# Patient Record
Sex: Female | Born: 1940 | Race: White | Hispanic: No | Smoking: Former smoker
Health system: Southern US, Community
[De-identification: ages and names within clinical notes are randomized; demographics above are authoritative.]

## PROBLEM LIST (undated history)

## (undated) DIAGNOSIS — Z9289 Personal history of other medical treatment: Secondary | ICD-10-CM

## (undated) DIAGNOSIS — I10 Essential (primary) hypertension: Secondary | ICD-10-CM

## (undated) DIAGNOSIS — I4891 Unspecified atrial fibrillation: Secondary | ICD-10-CM

## (undated) DIAGNOSIS — Z720 Tobacco use: Secondary | ICD-10-CM

## (undated) DIAGNOSIS — IMO0002 Reserved for concepts with insufficient information to code with codable children: Secondary | ICD-10-CM

## (undated) DIAGNOSIS — E079 Disorder of thyroid, unspecified: Secondary | ICD-10-CM

## (undated) HISTORY — DX: Tobacco use: Z72.0

## (undated) HISTORY — DX: Unspecified atrial fibrillation: I48.91

## (undated) HISTORY — DX: Reserved for concepts with insufficient information to code with codable children: IMO0002

## (undated) HISTORY — PX: BACK SURGERY: SHX140

## (undated) HISTORY — DX: Personal history of other medical treatment: Z92.89

## (undated) HISTORY — PX: BREAST EXCISIONAL BIOPSY: SUR124

---

## 2000-10-21 ENCOUNTER — Ambulatory Visit (HOSPITAL_COMMUNITY): Admission: RE | Admit: 2000-10-21 | Discharge: 2000-10-21 | Payer: Self-pay | Admitting: Internal Medicine

## 2000-10-21 ENCOUNTER — Encounter: Payer: Self-pay | Admitting: Internal Medicine

## 2000-10-25 ENCOUNTER — Encounter: Payer: Self-pay | Admitting: Family Medicine

## 2000-10-25 ENCOUNTER — Ambulatory Visit (HOSPITAL_COMMUNITY): Admission: RE | Admit: 2000-10-25 | Discharge: 2000-10-25 | Payer: Self-pay | Admitting: Family Medicine

## 2000-10-26 ENCOUNTER — Other Ambulatory Visit: Admission: RE | Admit: 2000-10-26 | Discharge: 2000-10-26 | Payer: Self-pay | Admitting: Family Medicine

## 2000-10-31 ENCOUNTER — Ambulatory Visit (HOSPITAL_COMMUNITY): Admission: RE | Admit: 2000-10-31 | Discharge: 2000-10-31 | Payer: Self-pay | Admitting: General Surgery

## 2001-02-01 ENCOUNTER — Encounter: Payer: Self-pay | Admitting: Family Medicine

## 2001-02-01 ENCOUNTER — Ambulatory Visit (HOSPITAL_COMMUNITY): Admission: RE | Admit: 2001-02-01 | Discharge: 2001-02-01 | Payer: Self-pay | Admitting: Family Medicine

## 2001-02-20 ENCOUNTER — Encounter: Payer: Self-pay | Admitting: Otolaryngology

## 2001-02-20 ENCOUNTER — Ambulatory Visit (HOSPITAL_COMMUNITY): Admission: RE | Admit: 2001-02-20 | Discharge: 2001-02-20 | Payer: Self-pay | Admitting: Otolaryngology

## 2001-10-23 ENCOUNTER — Ambulatory Visit (HOSPITAL_COMMUNITY): Admission: RE | Admit: 2001-10-23 | Discharge: 2001-10-23 | Payer: Self-pay | Admitting: Family Medicine

## 2001-10-23 ENCOUNTER — Encounter: Payer: Self-pay | Admitting: Family Medicine

## 2001-11-07 ENCOUNTER — Encounter: Payer: Self-pay | Admitting: Family Medicine

## 2001-11-07 ENCOUNTER — Ambulatory Visit (HOSPITAL_COMMUNITY): Admission: RE | Admit: 2001-11-07 | Discharge: 2001-11-07 | Payer: Self-pay | Admitting: Family Medicine

## 2002-10-23 ENCOUNTER — Encounter: Payer: Self-pay | Admitting: Family Medicine

## 2002-10-23 ENCOUNTER — Ambulatory Visit (HOSPITAL_COMMUNITY): Admission: RE | Admit: 2002-10-23 | Discharge: 2002-10-23 | Payer: Self-pay | Admitting: Family Medicine

## 2002-10-26 ENCOUNTER — Encounter: Payer: Self-pay | Admitting: Family Medicine

## 2002-10-26 ENCOUNTER — Ambulatory Visit (HOSPITAL_COMMUNITY): Admission: RE | Admit: 2002-10-26 | Discharge: 2002-10-26 | Payer: Self-pay | Admitting: Family Medicine

## 2003-10-28 ENCOUNTER — Ambulatory Visit (HOSPITAL_COMMUNITY): Admission: RE | Admit: 2003-10-28 | Discharge: 2003-10-28 | Payer: Self-pay | Admitting: Family Medicine

## 2004-01-14 ENCOUNTER — Ambulatory Visit (HOSPITAL_COMMUNITY): Admission: RE | Admit: 2004-01-14 | Discharge: 2004-01-14 | Payer: Self-pay | Admitting: Family Medicine

## 2004-01-16 ENCOUNTER — Ambulatory Visit (HOSPITAL_COMMUNITY): Admission: RE | Admit: 2004-01-16 | Discharge: 2004-01-16 | Payer: Self-pay | Admitting: Family Medicine

## 2004-08-20 ENCOUNTER — Other Ambulatory Visit: Admission: RE | Admit: 2004-08-20 | Discharge: 2004-08-20 | Payer: Self-pay | Admitting: Dermatology

## 2004-11-17 ENCOUNTER — Ambulatory Visit (HOSPITAL_COMMUNITY): Admission: RE | Admit: 2004-11-17 | Discharge: 2004-11-17 | Payer: Self-pay | Admitting: Family Medicine

## 2004-12-02 ENCOUNTER — Ambulatory Visit (HOSPITAL_COMMUNITY): Admission: RE | Admit: 2004-12-02 | Discharge: 2004-12-02 | Payer: Self-pay | Admitting: Family Medicine

## 2005-01-11 ENCOUNTER — Encounter (HOSPITAL_COMMUNITY): Admission: RE | Admit: 2005-01-11 | Discharge: 2005-02-10 | Payer: Self-pay | Admitting: Orthopaedic Surgery

## 2005-07-13 ENCOUNTER — Ambulatory Visit (HOSPITAL_COMMUNITY): Admission: RE | Admit: 2005-07-13 | Discharge: 2005-07-13 | Payer: Self-pay | Admitting: Family Medicine

## 2005-09-02 ENCOUNTER — Ambulatory Visit: Payer: Self-pay | Admitting: Internal Medicine

## 2005-09-17 ENCOUNTER — Encounter (INDEPENDENT_AMBULATORY_CARE_PROVIDER_SITE_OTHER): Payer: Self-pay | Admitting: Specialist

## 2005-09-17 ENCOUNTER — Ambulatory Visit (HOSPITAL_COMMUNITY): Admission: RE | Admit: 2005-09-17 | Discharge: 2005-09-17 | Payer: Self-pay | Admitting: Internal Medicine

## 2005-09-17 ENCOUNTER — Ambulatory Visit: Payer: Self-pay | Admitting: Internal Medicine

## 2005-11-19 ENCOUNTER — Ambulatory Visit (HOSPITAL_COMMUNITY): Admission: RE | Admit: 2005-11-19 | Discharge: 2005-11-19 | Payer: Self-pay | Admitting: Family Medicine

## 2006-11-22 ENCOUNTER — Ambulatory Visit (HOSPITAL_COMMUNITY): Admission: RE | Admit: 2006-11-22 | Discharge: 2006-11-22 | Payer: Self-pay | Admitting: Family Medicine

## 2007-05-21 ENCOUNTER — Emergency Department (HOSPITAL_COMMUNITY): Admission: EM | Admit: 2007-05-21 | Discharge: 2007-05-21 | Payer: Self-pay | Admitting: Emergency Medicine

## 2007-11-24 ENCOUNTER — Ambulatory Visit (HOSPITAL_COMMUNITY): Admission: RE | Admit: 2007-11-24 | Discharge: 2007-11-24 | Payer: Self-pay | Admitting: Family Medicine

## 2008-01-26 ENCOUNTER — Ambulatory Visit (HOSPITAL_COMMUNITY): Admission: RE | Admit: 2008-01-26 | Discharge: 2008-01-26 | Payer: Self-pay | Admitting: Family Medicine

## 2008-10-02 ENCOUNTER — Encounter (INDEPENDENT_AMBULATORY_CARE_PROVIDER_SITE_OTHER): Payer: Self-pay | Admitting: General Surgery

## 2008-10-02 ENCOUNTER — Ambulatory Visit (HOSPITAL_COMMUNITY): Admission: RE | Admit: 2008-10-02 | Discharge: 2008-10-02 | Payer: Self-pay | Admitting: General Surgery

## 2009-03-08 DIAGNOSIS — I4891 Unspecified atrial fibrillation: Secondary | ICD-10-CM

## 2009-03-08 HISTORY — DX: Unspecified atrial fibrillation: I48.91

## 2009-06-10 ENCOUNTER — Ambulatory Visit (HOSPITAL_COMMUNITY): Admission: RE | Admit: 2009-06-10 | Discharge: 2009-06-10 | Payer: Self-pay | Admitting: Family Medicine

## 2009-07-06 DIAGNOSIS — Z9289 Personal history of other medical treatment: Secondary | ICD-10-CM

## 2009-07-06 HISTORY — DX: Personal history of other medical treatment: Z92.89

## 2009-07-24 ENCOUNTER — Encounter (INDEPENDENT_AMBULATORY_CARE_PROVIDER_SITE_OTHER): Payer: Self-pay | Admitting: Neurological Surgery

## 2009-07-24 ENCOUNTER — Inpatient Hospital Stay (HOSPITAL_COMMUNITY): Admission: RE | Admit: 2009-07-24 | Discharge: 2009-07-25 | Payer: Self-pay | Admitting: Neurological Surgery

## 2009-07-24 ENCOUNTER — Ambulatory Visit: Payer: Self-pay | Admitting: Cardiology

## 2009-07-24 DIAGNOSIS — IMO0002 Reserved for concepts with insufficient information to code with codable children: Secondary | ICD-10-CM

## 2009-07-24 HISTORY — DX: Reserved for concepts with insufficient information to code with codable children: IMO0002

## 2009-08-21 ENCOUNTER — Encounter: Payer: Self-pay | Admitting: Cardiology

## 2009-08-21 DIAGNOSIS — F17200 Nicotine dependence, unspecified, uncomplicated: Secondary | ICD-10-CM | POA: Insufficient documentation

## 2009-08-21 DIAGNOSIS — Z720 Tobacco use: Secondary | ICD-10-CM | POA: Insufficient documentation

## 2009-08-21 DIAGNOSIS — Z8679 Personal history of other diseases of the circulatory system: Secondary | ICD-10-CM | POA: Insufficient documentation

## 2009-08-22 ENCOUNTER — Ambulatory Visit: Payer: Self-pay | Admitting: Cardiology

## 2009-09-19 ENCOUNTER — Emergency Department (HOSPITAL_COMMUNITY): Admission: EM | Admit: 2009-09-19 | Discharge: 2009-09-19 | Payer: Self-pay | Admitting: Emergency Medicine

## 2009-09-25 ENCOUNTER — Ambulatory Visit (HOSPITAL_COMMUNITY): Admission: RE | Admit: 2009-09-25 | Discharge: 2009-09-26 | Payer: Self-pay | Admitting: Orthopaedic Surgery

## 2010-04-07 NOTE — Miscellaneous (Signed)
  Clinical Lists Changes  Problems: Added new problem of ATRIAL FIBRILLATION, HX OF (ICD-V12.59) Added new problem of TOBACCO ABUSE (ICD-305.1) Observations: Added new observation of REFERRING MD: Barnett Abu, MD (08/21/2009 16:32) Added new observation of PAST MED HX: T12 compression fracture  .. kyphoplasty.. Jul 24, 2009 Atrial fibrillation...Marland Kitchen postop.. 19, 2011..... spontaneous conversion to normal sinus after one hour Tobacco abuse EF  65%... echo... May, 2011 (08/21/2009 16:32)       Past History:  Past Medical History: T12 compression fracture  .. kyphoplasty.. Jul 24, 2009 Atrial fibrillation...Marland Kitchen postop.. 19, 2011..... spontaneous conversion to normal sinus after one hour Tobacco abuse EF  65%... echo... May, 2011

## 2010-04-07 NOTE — Assessment & Plan Note (Signed)
Summary: eph./ gd   Visit Type:  post hospital Referring Provider:  Barnett Abu, MD Primary Provider:  Assunta Found, MD   History of Present Illness: The patient is seen post hospitalization.  I had seen her in consultation at the hospital.  She had atrial fibrillation around the time of her kyphoplasty.  She had some mild variation in her blood pressure during the procedure.  She converted spontaneously to sinus rhythm and we saw no other arrhythmias.  Ejection fraction was 65% by echo done at that time.  She feels well and is fully active at home.  There is no chest pain or palpitations.  Current Medications (verified): 1)  Aleve 220 Mg Caps (Naproxen Sodium) .... Take 1 By Mouth As Needed  Allergies (verified): No Known Drug Allergies  Past History:  Family History: Last updated: 2009-09-14 Father died of an MI and stroke at age 78 Sister with breast cancer  Social History: Last updated: 09-14-09 Married  Has 2 children She is active in gardens and does yard work Tobacco Use - Yes, 3 cigarettes per day for 20 years Alcohol Use - no  Risk Factors: Smoking Status: current (09-14-2009)  Past Medical History: T12 compression fracture  .. kyphoplasty.. Jul 24, 2009 Atrial fibrillation...Marland Kitchen postop.. 19, 2011..... spontaneous conversion to normal sinus after one hour Tobacco abuse EF  65%... echo... May, 2011  Review of Systems       Patient denies fever, chills, headache, sweats, rash, change in vision, change in hearing, chest pain, cough, nausea vomiting, urinary symptoms.  All other systems are reviewed and are negative.  Vital Signs:  Patient profile:   70 year old female Height:      61 inches Weight:      125 pounds BMI:     23.70 Pulse rate:   70 / minute Pulse rhythm:   regular BP sitting:   124 / 72  (left arm) Cuff size:   regular  Vitals Entered By: [name], EMT-P (09-14-09 10:34 AM)  Physical Exam  General:  patient is stable in  general. Eyes:  no xanthelasma. Neck:  no jugular venous distention. Lungs:  lungs are clear.  Respiratory effort is nonlabored. Heart:  cardiac exam reveals S1-S2.  No clicks or significant murmurs. Abdomen:  abdomen is soft. Extremities:  no peripheral edema. Psych:  patient is oriented to person time and place.  Affect is normal.  She is here with her husband.   Impression & Recommendations:  Problem # 1:  TOBACCO ABUSE (ICD-305.1) The patient continues to smoke.  I have encouraged her to stop.  Problem # 2:  ATRIAL FIBRILLATION, HX OF (ICD-V12.59)  The patient has a short burst of atrial fibrillation documented at the time of her kyphoplasty.  She has normal LV function.  She has no palpitations.  We have no evidence that she has atrial fib at other times.  EKG is done today reviewed by me.  She has normal sinus rhythm.  I feel the patient needs no further workup.  She does not need anticoagulation.  As part of today's evaluation I have reviewed her hospital records  Orders: EKG w/ Interpretation ([id number])

## 2010-05-23 LAB — DIFFERENTIAL
Basophils Absolute: 0 10*3/uL (ref 0.0–0.1)
Basophils Relative: 0 % (ref 0–1)
Eosinophils Absolute: 0.1 10*3/uL (ref 0.0–0.7)
Eosinophils Relative: 2 % (ref 0–5)
Lymphocytes Relative: 21 % (ref 12–46)
Lymphs Abs: 2 10*3/uL (ref 0.7–4.0)
Monocytes Absolute: 0.4 10*3/uL (ref 0.1–1.0)
Monocytes Relative: 4 % (ref 3–12)
Neutro Abs: 6.8 10*3/uL (ref 1.7–7.7)
Neutrophils Relative %: 72 % (ref 43–77)

## 2010-05-23 LAB — CBC
HCT: 33.5 % — ABNORMAL LOW (ref 36.0–46.0)
HCT: 37.3 % (ref 36.0–46.0)
Hemoglobin: 11.4 g/dL — ABNORMAL LOW (ref 12.0–15.0)
Hemoglobin: 12.8 g/dL (ref 12.0–15.0)
MCH: 30.6 pg (ref 26.0–34.0)
MCH: 30.6 pg (ref 26.0–34.0)
MCHC: 34 g/dL (ref 30.0–36.0)
MCHC: 34.4 g/dL (ref 30.0–36.0)
MCV: 88.9 fL (ref 78.0–100.0)
MCV: 90.1 fL (ref 78.0–100.0)
Platelets: 200 10*3/uL (ref 150–400)
Platelets: 216 10*3/uL (ref 150–400)
RBC: 3.71 MIL/uL — ABNORMAL LOW (ref 3.87–5.11)
RBC: 4.2 MIL/uL (ref 3.87–5.11)
RDW: 14.1 % (ref 11.5–15.5)
RDW: 14.1 % (ref 11.5–15.5)
WBC: 8.1 10*3/uL (ref 4.0–10.5)
WBC: 9.4 10*3/uL (ref 4.0–10.5)

## 2010-05-23 LAB — BASIC METABOLIC PANEL
BUN: 7 mg/dL (ref 6–23)
CO2: 26 mEq/L (ref 19–32)
Calcium: 8.9 mg/dL (ref 8.4–10.5)
Chloride: 101 mEq/L (ref 96–112)
Creatinine, Ser: 0.55 mg/dL (ref 0.4–1.2)
GFR calc Af Amer: >60 mL/min (ref >60)
GFR calc non Af Amer: >60 mL/min (ref >60)
Glucose, Bld: 120 mg/dL — ABNORMAL HIGH (ref 70–99)
Potassium: 3.8 mEq/L (ref 3.5–5.1)
Sodium: 134 mEq/L — ABNORMAL LOW (ref 135–145)

## 2010-05-23 LAB — SURGICAL PCR SCREEN
MRSA, PCR: NEGATIVE
Staphylococcus aureus: POSITIVE — AB

## 2010-05-25 LAB — CBC
HCT: 35 % — ABNORMAL LOW (ref 36.0–46.0)
Hemoglobin: 12 g/dL (ref 12.0–15.0)
MCHC: 34.4 g/dL (ref 30.0–36.0)
MCV: 90 fL (ref 78.0–100.0)
Platelets: 202 10*3/uL (ref 150–400)
RBC: 3.89 MIL/uL (ref 3.87–5.11)
RDW: 13.8 % (ref 11.5–15.5)
WBC: 8.9 10*3/uL (ref 4.0–10.5)

## 2010-05-25 LAB — BASIC METABOLIC PANEL
BUN: 9 mg/dL (ref 6–23)
CO2: 31 mEq/L (ref 19–32)
Calcium: 8.9 mg/dL (ref 8.4–10.5)
Chloride: 99 mEq/L (ref 96–112)
Creatinine, Ser: 0.7 mg/dL (ref 0.4–1.2)
GFR calc Af Amer: >60 mL/min (ref >60)
GFR calc non Af Amer: >60 mL/min (ref >60)
Glucose, Bld: 104 mg/dL — ABNORMAL HIGH (ref 70–99)
Potassium: 3.3 mEq/L — ABNORMAL LOW (ref 3.5–5.1)
Sodium: 134 mEq/L — ABNORMAL LOW (ref 135–145)

## 2010-05-25 LAB — TSH: TSH: 1.157 u[IU]/mL (ref 0.350–4.500)

## 2010-05-25 LAB — MAGNESIUM: Magnesium: 2.3 mg/dL (ref 1.5–2.5)

## 2010-05-26 LAB — CBC
HCT: 38.7 % (ref 36.0–46.0)
Hemoglobin: 13.6 g/dL (ref 12.0–15.0)
MCHC: 35.1 g/dL (ref 30.0–36.0)
MCV: 88.2 fL (ref 78.0–100.0)
Platelets: 229 10*3/uL (ref 150–400)
RBC: 4.39 MIL/uL (ref 3.87–5.11)
RDW: 13.9 % (ref 11.5–15.5)
WBC: 8 10*3/uL (ref 4.0–10.5)

## 2010-05-26 LAB — SURGICAL PCR SCREEN
MRSA, PCR: NEGATIVE
Staphylococcus aureus: POSITIVE — AB

## 2010-06-14 LAB — DIFFERENTIAL
Basophils Absolute: 0 10*3/uL (ref 0.0–0.1)
Basophils Relative: 1 % (ref 0–1)
Eosinophils Absolute: 0.3 10*3/uL (ref 0.0–0.7)
Eosinophils Relative: 4 % (ref 0–5)
Lymphocytes Relative: 32 % (ref 12–46)
Lymphs Abs: 2.3 10*3/uL (ref 0.7–4.0)
Monocytes Absolute: 0.4 10*3/uL (ref 0.1–1.0)
Monocytes Relative: 6 % (ref 3–12)
Neutro Abs: 4.1 10*3/uL (ref 1.7–7.7)
Neutrophils Relative %: 58 % (ref 43–77)

## 2010-06-14 LAB — BASIC METABOLIC PANEL
BUN: 5 mg/dL — ABNORMAL LOW (ref 6–23)
CO2: 28 mEq/L (ref 19–32)
Calcium: 9 mg/dL (ref 8.4–10.5)
Chloride: 103 mEq/L (ref 96–112)
Creatinine, Ser: 0.49 mg/dL (ref 0.4–1.2)
GFR calc Af Amer: >60 mL/min (ref >60)
GFR calc non Af Amer: >60 mL/min (ref >60)
Glucose, Bld: 101 mg/dL — ABNORMAL HIGH (ref 70–99)
Potassium: 3.9 mEq/L (ref 3.5–5.1)
Sodium: 136 mEq/L (ref 135–145)

## 2010-06-14 LAB — URINALYSIS, ROUTINE W REFLEX MICROSCOPIC
Bilirubin Urine: NEGATIVE
Glucose, UA: NEGATIVE mg/dL
Hgb urine dipstick: NEGATIVE
Ketones, ur: NEGATIVE mg/dL
Nitrite: NEGATIVE
Protein, ur: NEGATIVE mg/dL
Specific Gravity, Urine: 1.009 (ref 1.005–1.030)
Urobilinogen, UA: 0.2 mg/dL (ref 0.0–1.0)
pH: 8 (ref 5.0–8.0)

## 2010-06-14 LAB — URINE MICROSCOPIC-ADD ON: Urine-Other: NONE SEEN

## 2010-06-14 LAB — CBC
HCT: 38.6 % (ref 36.0–46.0)
Hemoglobin: 13.2 g/dL (ref 12.0–15.0)
MCHC: 34.2 g/dL (ref 30.0–36.0)
MCV: 87.5 fL (ref 78.0–100.0)
Platelets: 212 10*3/uL (ref 150–400)
RBC: 4.41 MIL/uL (ref 3.87–5.11)
RDW: 13.7 % (ref 11.5–15.5)
WBC: 7.1 10*3/uL (ref 4.0–10.5)

## 2010-06-14 LAB — PROTIME-INR
INR: 0.9 (ref 0.00–1.49)
Prothrombin Time: 12.7 seconds (ref 11.6–15.2)

## 2010-06-14 LAB — APTT: aPTT: 35 seconds (ref 24–37)

## 2010-07-21 NOTE — Op Note (Signed)
NAMEFLOREE, ZUNIGA              ACCOUNT NO.:  [id number]   MEDICAL RECORD NO.:  [id number]          PATIENT TYPE:  AMB   LOCATION:  DAY                          FACILITY:  [facility]   PHYSICIAN:  Almond Lint, MD       DATE OF BIRTH:  Aug 25, 1940   DATE OF PROCEDURE:  10/02/2008  DATE OF DISCHARGE:                               OPERATIVE REPORT   PREOPERATIVE DIAGNOSES:  Hemorrhoids.   POSTOPERATIVE DIAGNOSES:  Hemorrhoids.   PROCEDURE PERFORMED:  Exam under anesthesia, external hemorrhoidectomy  x3 columns, internal hemorrhoid band x 1, and excision of two rectal  tags.   SURGEON:  Almond Lint, M.D.   ASSISTANT:  None.   ANESTHESIA:  General and local.   </FINDINGS  Large external hemorrhoid, two columns combined posteriorly that were  excised, one small one anteriorly. Rectal tag laterally on the left and  one on the right, also one anterior internal hemorrhoid that was  inflamed.   SPECIMEN:  1. Right lateral rectal tag.  2. Left lateral rectal tag.  3. Posterior external hemorrhoids.  4. Anterior external hemorrhoid.  These all went to pathology for permanent section.   ESTIMATED BLOOD LOSS:  Minimal.   COMPLICATIONS:  None known.   PROCEDURE:  Ms. Shadduck was identified in the holding area and taken to the  operating room where she was placed supine on the operating room table.  General anesthesia was induced.  The patient was then placed in the  lithotomy position.  Her rectum was prepped and draped in sterile  fashion.  Time-out was performed, according to the surgical safety check  list.  When all was correct, we continued.   First, the patient's rectum was examined digitally.  Next, the patient  underwent anoscopy.  There were two lateral rectal skin tags that were  excised and closed with 3-0 chromic.  The posterior external hemorrhoids  were then addressed.  These were excised with the harmonic scalpel and  left open.  There was a small external tag on top  of a small external  hemorrhoid.  The external tag only was taken with the harmonic scalpel.  These were anesthetized.  There was an anterior hemorrhoid that was banded.  The wounds were then  dressed with Gelfoam with Dibucaine ointment and local anesthetic was  injected under the external hemorrhoids.  The patient was awakened from  anesthesia and taken to PACU in stable condition.      Almond Lint, MD  Electronically Signed     FB/MEDQ  D:  10/02/2008  T:  10/02/2008  Job:  [id number]

## 2010-07-24 NOTE — Op Note (Signed)
NAME:  [name], Courtney Arnold              ACCOUNT NO.:  [id number]   MEDICAL RECORD NO.:  [id number]          PATIENT TYPE:  AMB   LOCATION:  DAY                           FACILITY:  [facility]   PHYSICIAN:  R. Roetta Sessions, M.D. DATE OF BIRTH:  [birth year]   DATE OF PROCEDURE:  09/17/2005  DATE OF DISCHARGE:                                 OPERATIVE REPORT   PROCEDURE:  Colonoscopy with biopsy.   INDICATION FOR PROCEDURE:  The patient is a 70 year old lady with  intermittent hematochezia in the setting of occasional constipation.  Has  occasional burning and itching in the perianal/rectal area.  Colonoscopy is  now being done.  This approach has been discussed with the patient at  length, the potential risks, benefits, and alternatives have been reviewed,  questions answered.  She is agreeable.  Please see documentation in the  medical record.   PROCEDURE NOTE:  O2 saturation, blood pressure, pulse, and respiration were  monitored throughout the entire procedure.   CONSCIOUS SEDATION:  Versed 3 mg IV, Demerol 75 mg in divided doses.   FINDINGS:  Digital rectal exam revealed a couple of external hemorrhoidal  tags.  Endoscopic findings:  The prep was adequate.   Rectum:  Examination of the rectal mucosa revealed a couple of anal papillae  and internal hemorrhoids.  The rectal vault was small and I was unable to  retroflex but for some reason, I was able to see the rectum en face.  There  at the rectosigmoid at 15 cm, there were two 3-mm polyps, which were cold  biopsied/removed.   Colon:  The colonic mucosa was surveyed through the rectosigmoid junction  through the left, transverse and right colon to the area of the appendiceal  orifice, ileocecal valve and cecum.  These structures were well-seen and  photographed for the record.  From this level the scope was slowly and  cautiously withdrawn and all previously-mentioned mucosal surfaces were  again seen.  The patient had scattered  left-sided diverticula.  The  remainder of the colonic mucosa appeared normal.  The patient tolerated the  procedure well, was reacted in endoscopy.   IMPRESSION:  1.  Anal papillae and internal hemorrhoids, otherwise normal rectum.  2.  Diminutive polyps at 15 cm, cold biopsied/removed.  3.  Left-sided diverticula.  4.  The remainder of the colonic mucosa appeared normal.   RECOMMENDATIONS:  1.  Hemorrhoid and diverticulosis literature given to Ms. Kauk.  She should      begin a daily fiber supplement in the way of Metamucil or Citrucel or      other agent, etc.  2.  A 2-week course of [place] 1 g mesalamine suppositories one per rectum at      bedtime.  3.  Follow up the pathology.  4.  Further recommendations to follow.      Jonathon Bellows, M.D.  Electronically Signed     RMR/MEDQ  D:  09/17/2005  T:  09/17/2005  Job:  [id number]   cc:   Corrie Mckusick, M.D.  Fax: [phone]

## 2010-09-10 ENCOUNTER — Encounter: Payer: Self-pay | Admitting: Cardiology

## 2011-09-28 DIAGNOSIS — H2589 Other age-related cataract: Secondary | ICD-10-CM | POA: Diagnosis not present

## 2011-09-28 DIAGNOSIS — H52229 Regular astigmatism, unspecified eye: Secondary | ICD-10-CM | POA: Diagnosis not present

## 2011-09-28 DIAGNOSIS — H524 Presbyopia: Secondary | ICD-10-CM | POA: Diagnosis not present

## 2011-09-28 DIAGNOSIS — H5200 Hypermetropia, unspecified eye: Secondary | ICD-10-CM | POA: Diagnosis not present

## 2011-11-17 DIAGNOSIS — Z23 Encounter for immunization: Secondary | ICD-10-CM | POA: Diagnosis not present

## 2011-12-28 ENCOUNTER — Other Ambulatory Visit (HOSPITAL_COMMUNITY): Payer: Self-pay | Admitting: Family Medicine

## 2011-12-28 DIAGNOSIS — Z139 Encounter for screening, unspecified: Secondary | ICD-10-CM

## 2012-01-04 ENCOUNTER — Ambulatory Visit (HOSPITAL_COMMUNITY)
Admission: RE | Admit: 2012-01-04 | Discharge: 2012-01-04 | Disposition: A | Discharge status: Home / Self Care | Payer: Medicare Other | Source: Ambulatory Visit | Attending: Family Medicine | Admitting: Family Medicine

## 2012-01-04 DIAGNOSIS — Z139 Encounter for screening, unspecified: Secondary | ICD-10-CM

## 2012-01-04 DIAGNOSIS — Z1231 Encounter for screening mammogram for malignant neoplasm of breast: Secondary | ICD-10-CM | POA: Diagnosis not present

## 2012-03-14 DIAGNOSIS — Z0000 Encounter for general adult medical examination without abnormal findings: Secondary | ICD-10-CM | POA: Diagnosis not present

## 2012-03-14 DIAGNOSIS — IMO0002 Reserved for concepts with insufficient information to code with codable children: Secondary | ICD-10-CM | POA: Diagnosis not present

## 2012-03-14 DIAGNOSIS — Z719 Counseling, unspecified: Secondary | ICD-10-CM | POA: Diagnosis not present

## 2012-03-14 DIAGNOSIS — E785 Hyperlipidemia, unspecified: Secondary | ICD-10-CM | POA: Diagnosis not present

## 2012-11-10 DIAGNOSIS — IMO0002 Reserved for concepts with insufficient information to code with codable children: Secondary | ICD-10-CM | POA: Diagnosis not present

## 2012-11-10 DIAGNOSIS — Z23 Encounter for immunization: Secondary | ICD-10-CM | POA: Diagnosis not present

## 2012-11-10 DIAGNOSIS — L989 Disorder of the skin and subcutaneous tissue, unspecified: Secondary | ICD-10-CM | POA: Diagnosis not present

## 2012-11-27 ENCOUNTER — Other Ambulatory Visit (HOSPITAL_COMMUNITY): Payer: Self-pay | Admitting: Family Medicine

## 2012-11-27 DIAGNOSIS — D2360 Other benign neoplasm of skin of unspecified upper limb, including shoulder: Secondary | ICD-10-CM | POA: Diagnosis not present

## 2012-11-27 DIAGNOSIS — Z139 Encounter for screening, unspecified: Secondary | ICD-10-CM

## 2012-11-27 DIAGNOSIS — D235 Other benign neoplasm of skin of trunk: Secondary | ICD-10-CM | POA: Diagnosis not present

## 2012-11-27 DIAGNOSIS — L821 Other seborrheic keratosis: Secondary | ICD-10-CM | POA: Diagnosis not present

## 2013-01-08 ENCOUNTER — Ambulatory Visit (HOSPITAL_COMMUNITY)
Admission: RE | Admit: 2013-01-08 | Discharge: 2013-01-08 | Disposition: A | Discharge status: Home / Self Care | Payer: Medicare Other | Source: Ambulatory Visit | Attending: Family Medicine | Admitting: Family Medicine

## 2013-01-08 DIAGNOSIS — Z139 Encounter for screening, unspecified: Secondary | ICD-10-CM

## 2013-01-08 DIAGNOSIS — Z1231 Encounter for screening mammogram for malignant neoplasm of breast: Secondary | ICD-10-CM | POA: Insufficient documentation

## 2013-07-02 DIAGNOSIS — D0470 Carcinoma in situ of skin of unspecified lower limb, including hip: Secondary | ICD-10-CM | POA: Diagnosis not present

## 2013-07-02 DIAGNOSIS — L821 Other seborrheic keratosis: Secondary | ICD-10-CM | POA: Diagnosis not present

## 2013-07-02 DIAGNOSIS — L570 Actinic keratosis: Secondary | ICD-10-CM | POA: Diagnosis not present

## 2013-08-13 DIAGNOSIS — D0470 Carcinoma in situ of skin of unspecified lower limb, including hip: Secondary | ICD-10-CM | POA: Diagnosis not present

## 2013-09-13 DIAGNOSIS — Z85828 Personal history of other malignant neoplasm of skin: Secondary | ICD-10-CM | POA: Diagnosis not present

## 2013-11-28 DIAGNOSIS — Z23 Encounter for immunization: Secondary | ICD-10-CM | POA: Diagnosis not present

## 2013-12-05 ENCOUNTER — Other Ambulatory Visit (HOSPITAL_COMMUNITY): Payer: Self-pay | Admitting: Family Medicine

## 2013-12-05 DIAGNOSIS — Z139 Encounter for screening, unspecified: Secondary | ICD-10-CM

## 2013-12-05 DIAGNOSIS — Z1231 Encounter for screening mammogram for malignant neoplasm of breast: Secondary | ICD-10-CM

## 2014-01-10 ENCOUNTER — Ambulatory Visit (HOSPITAL_COMMUNITY)
Admission: RE | Admit: 2014-01-10 | Discharge: 2014-01-10 | Disposition: A | Discharge status: Home / Self Care | Payer: Medicare Other | Source: Ambulatory Visit | Attending: Family Medicine | Admitting: Family Medicine

## 2014-01-10 DIAGNOSIS — Z1231 Encounter for screening mammogram for malignant neoplasm of breast: Secondary | ICD-10-CM | POA: Insufficient documentation

## 2014-01-14 ENCOUNTER — Other Ambulatory Visit: Payer: Self-pay | Admitting: Family Medicine

## 2014-01-14 DIAGNOSIS — R928 Other abnormal and inconclusive findings on diagnostic imaging of breast: Secondary | ICD-10-CM

## 2014-02-05 ENCOUNTER — Ambulatory Visit (HOSPITAL_COMMUNITY)
Admission: RE | Admit: 2014-02-05 | Discharge: 2014-02-05 | Disposition: A | Discharge status: Home / Self Care | Payer: Medicare Other | Source: Ambulatory Visit | Attending: Family Medicine | Admitting: Family Medicine

## 2014-02-05 ENCOUNTER — Other Ambulatory Visit (HOSPITAL_COMMUNITY): Payer: Self-pay | Admitting: Family Medicine

## 2014-02-05 DIAGNOSIS — R928 Other abnormal and inconclusive findings on diagnostic imaging of breast: Secondary | ICD-10-CM | POA: Diagnosis not present

## 2014-02-05 DIAGNOSIS — N6489 Other specified disorders of breast: Secondary | ICD-10-CM

## 2014-02-05 DIAGNOSIS — R922 Inconclusive mammogram: Secondary | ICD-10-CM | POA: Diagnosis not present

## 2014-11-07 DIAGNOSIS — H1030 Unspecified acute conjunctivitis, unspecified eye: Secondary | ICD-10-CM | POA: Diagnosis not present

## 2014-12-31 ENCOUNTER — Other Ambulatory Visit (HOSPITAL_COMMUNITY): Payer: Self-pay | Admitting: Family Medicine

## 2014-12-31 DIAGNOSIS — Z1231 Encounter for screening mammogram for malignant neoplasm of breast: Secondary | ICD-10-CM

## 2015-01-03 DIAGNOSIS — Z23 Encounter for immunization: Secondary | ICD-10-CM | POA: Diagnosis not present

## 2015-02-13 ENCOUNTER — Ambulatory Visit (HOSPITAL_COMMUNITY)
Admission: RE | Admit: 2015-02-13 | Discharge: 2015-02-13 | Disposition: A | Discharge status: Home / Self Care | Payer: Medicare Other | Source: Ambulatory Visit | Attending: Family Medicine | Admitting: Family Medicine

## 2015-02-13 DIAGNOSIS — Z1231 Encounter for screening mammogram for malignant neoplasm of breast: Secondary | ICD-10-CM | POA: Diagnosis not present

## 2015-02-21 DIAGNOSIS — M810 Age-related osteoporosis without current pathological fracture: Secondary | ICD-10-CM | POA: Diagnosis not present

## 2015-02-21 DIAGNOSIS — E782 Mixed hyperlipidemia: Secondary | ICD-10-CM | POA: Diagnosis not present

## 2015-02-21 DIAGNOSIS — Z1389 Encounter for screening for other disorder: Secondary | ICD-10-CM | POA: Diagnosis not present

## 2015-02-21 DIAGNOSIS — Z6821 Body mass index (BMI) 21.0-21.9, adult: Secondary | ICD-10-CM | POA: Diagnosis not present

## 2015-02-21 DIAGNOSIS — M5136 Other intervertebral disc degeneration, lumbar region: Secondary | ICD-10-CM | POA: Diagnosis not present

## 2015-02-21 DIAGNOSIS — M5410 Radiculopathy, site unspecified: Secondary | ICD-10-CM | POA: Diagnosis not present

## 2015-04-02 DIAGNOSIS — M810 Age-related osteoporosis without current pathological fracture: Secondary | ICD-10-CM | POA: Diagnosis not present

## 2015-04-02 DIAGNOSIS — M1991 Primary osteoarthritis, unspecified site: Secondary | ICD-10-CM | POA: Diagnosis not present

## 2015-04-02 DIAGNOSIS — Z1389 Encounter for screening for other disorder: Secondary | ICD-10-CM | POA: Diagnosis not present

## 2015-04-02 DIAGNOSIS — E782 Mixed hyperlipidemia: Secondary | ICD-10-CM | POA: Diagnosis not present

## 2015-04-02 DIAGNOSIS — Z6822 Body mass index (BMI) 22.0-22.9, adult: Secondary | ICD-10-CM | POA: Diagnosis not present

## 2015-04-02 DIAGNOSIS — Z0000 Encounter for general adult medical examination without abnormal findings: Secondary | ICD-10-CM | POA: Diagnosis not present

## 2015-04-03 DIAGNOSIS — Z79899 Other long term (current) drug therapy: Secondary | ICD-10-CM | POA: Diagnosis not present

## 2015-04-03 DIAGNOSIS — D649 Anemia, unspecified: Secondary | ICD-10-CM | POA: Diagnosis not present

## 2015-04-07 ENCOUNTER — Emergency Department (HOSPITAL_COMMUNITY): Payer: Medicare Other

## 2015-04-07 ENCOUNTER — Encounter (HOSPITAL_COMMUNITY): Payer: Self-pay | Admitting: Emergency Medicine

## 2015-04-07 ENCOUNTER — Emergency Department (HOSPITAL_COMMUNITY)
Admission: EM | Admit: 2015-04-07 | Discharge: 2015-04-08 | Disposition: A | Discharge status: Home / Self Care | Payer: Medicare Other | Attending: Emergency Medicine | Admitting: Emergency Medicine

## 2015-04-07 DIAGNOSIS — Y998 Other external cause status: Secondary | ICD-10-CM | POA: Insufficient documentation

## 2015-04-07 DIAGNOSIS — M545 Low back pain: Secondary | ICD-10-CM | POA: Diagnosis not present

## 2015-04-07 DIAGNOSIS — S3992XA Unspecified injury of lower back, initial encounter: Secondary | ICD-10-CM | POA: Diagnosis not present

## 2015-04-07 DIAGNOSIS — S22080A Wedge compression fracture of T11-T12 vertebra, initial encounter for closed fracture: Secondary | ICD-10-CM | POA: Insufficient documentation

## 2015-04-07 DIAGNOSIS — M546 Pain in thoracic spine: Secondary | ICD-10-CM

## 2015-04-07 DIAGNOSIS — X58XXXA Exposure to other specified factors, initial encounter: Secondary | ICD-10-CM | POA: Insufficient documentation

## 2015-04-07 DIAGNOSIS — M549 Dorsalgia, unspecified: Secondary | ICD-10-CM | POA: Diagnosis not present

## 2015-04-07 DIAGNOSIS — Z87891 Personal history of nicotine dependence: Secondary | ICD-10-CM | POA: Insufficient documentation

## 2015-04-07 DIAGNOSIS — Z8679 Personal history of other diseases of the circulatory system: Secondary | ICD-10-CM | POA: Insufficient documentation

## 2015-04-07 DIAGNOSIS — Y9289 Other specified places as the place of occurrence of the external cause: Secondary | ICD-10-CM | POA: Diagnosis not present

## 2015-04-07 DIAGNOSIS — S299XXA Unspecified injury of thorax, initial encounter: Secondary | ICD-10-CM | POA: Diagnosis present

## 2015-04-07 DIAGNOSIS — R932 Abnormal findings on diagnostic imaging of liver and biliary tract: Secondary | ICD-10-CM

## 2015-04-07 DIAGNOSIS — Y9389 Activity, other specified: Secondary | ICD-10-CM | POA: Diagnosis not present

## 2015-04-07 DIAGNOSIS — S22000A Wedge compression fracture of unspecified thoracic vertebra, initial encounter for closed fracture: Secondary | ICD-10-CM

## 2015-04-07 DIAGNOSIS — S22050A Wedge compression fracture of T5-T6 vertebra, initial encounter for closed fracture: Secondary | ICD-10-CM | POA: Diagnosis not present

## 2015-04-07 LAB — COMPREHENSIVE METABOLIC PANEL
ALT: 16 U/L (ref 14–54)
AST: 18 U/L (ref 15–41)
Albumin: 3.4 g/dL — ABNORMAL LOW (ref 3.5–5.0)
Alkaline Phosphatase: 118 U/L (ref 38–126)
Anion gap: 7 (ref 5–15)
BUN: 15 mg/dL (ref 6–20)
CO2: 29 mmol/L (ref 22–32)
Calcium: 9.3 mg/dL (ref 8.9–10.3)
Chloride: 101 mmol/L (ref 101–111)
Creatinine, Ser: 0.4 mg/dL — ABNORMAL LOW (ref 0.44–1.00)
GFR calc Af Amer: >60 mL/min (ref >60)
GFR calc non Af Amer: >60 mL/min (ref >60)
Glucose, Bld: 105 mg/dL — ABNORMAL HIGH (ref 65–99)
Potassium: 3.8 mmol/L (ref 3.5–5.1)
Sodium: 137 mmol/L (ref 135–145)
Total Bilirubin: 0.5 mg/dL (ref 0.3–1.2)
Total Protein: 6.4 g/dL — ABNORMAL LOW (ref 6.5–8.1)

## 2015-04-07 LAB — I-STAT CHEM 8, ED
BUN: 15 mg/dL (ref 6–20)
Calcium, Ion: 1.25 mmol/L (ref 1.13–1.30)
Chloride: 101 mmol/L (ref 101–111)
Creatinine, Ser: 0.4 mg/dL — ABNORMAL LOW (ref 0.44–1.00)
Glucose, Bld: 107 mg/dL — ABNORMAL HIGH (ref 65–99)
HCT: 33 % — ABNORMAL LOW (ref 36.0–46.0)
Hemoglobin: 11.2 g/dL — ABNORMAL LOW (ref 12.0–15.0)
Potassium: 3.7 mmol/L (ref 3.5–5.1)
Sodium: 140 mmol/L (ref 135–145)
TCO2: 27 mmol/L (ref 0–100)

## 2015-04-07 LAB — CBC WITH DIFFERENTIAL/PLATELET
Basophils Absolute: 0 10*3/uL (ref 0.0–0.1)
Basophils Relative: 0 %
Eosinophils Absolute: 0.1 10*3/uL (ref 0.0–0.7)
Eosinophils Relative: 2 %
HCT: 31.3 % — ABNORMAL LOW (ref 36.0–46.0)
Hemoglobin: 10 g/dL — ABNORMAL LOW (ref 12.0–15.0)
Lymphocytes Relative: 39 %
Lymphs Abs: 2.7 10*3/uL (ref 0.7–4.0)
MCH: 26.1 pg (ref 26.0–34.0)
MCHC: 31.9 g/dL (ref 30.0–36.0)
MCV: 81.7 fL (ref 78.0–100.0)
Monocytes Absolute: 0.5 10*3/uL (ref 0.1–1.0)
Monocytes Relative: 8 %
Neutro Abs: 3.5 10*3/uL (ref 1.7–7.7)
Neutrophils Relative %: 51 %
Platelets: 214 10*3/uL (ref 150–400)
RBC: 3.83 MIL/uL — ABNORMAL LOW (ref 3.87–5.11)
RDW: 14.2 % (ref 11.5–15.5)
WBC: 6.8 10*3/uL (ref 4.0–10.5)

## 2015-04-07 LAB — LIPASE, BLOOD: Lipase: 27 U/L (ref 11–51)

## 2015-04-07 MED ORDER — OXYCODONE-ACETAMINOPHEN 5-325 MG PO TABS
1.0000 | ORAL_TABLET | Freq: Once | ORAL | Status: AC
  Administered 2015-04-07: 1 via ORAL
  Filled 2015-04-07: qty 1

## 2015-04-07 MED ORDER — OXYCODONE-ACETAMINOPHEN 5-325 MG PO TABS: 1.0000 | ORAL_TABLET | Freq: Four times a day (QID) | ORAL | Status: DC | PRN

## 2015-04-07 MED ORDER — ONDANSETRON 4 MG PO TBDP
4.0000 mg | ORAL_TABLET | Freq: Once | ORAL | Status: AC
  Administered 2015-04-07: 4 mg via ORAL
  Filled 2015-04-07: qty 1

## 2015-04-07 MED ORDER — IOHEXOL 350 MG/ML SOLN
100.0000 mL | Freq: Once | INTRAVENOUS | Status: AC | PRN
  Administered 2015-04-07: 100 mL via INTRAVENOUS

## 2015-04-07 MED ORDER — METHOCARBAMOL 500 MG PO TABS
500.0000 mg | ORAL_TABLET | Freq: Once | ORAL | Status: AC
  Administered 2015-04-07: 500 mg via ORAL
  Filled 2015-04-07: qty 1

## 2015-04-07 NOTE — ED Provider Notes (Signed)
CSN: [id number]     Arrival date & time 04/07/15  1759 History  By signing my name below, I, Hilda Lias, attest that this documentation has been prepared under the direction and in the presence of Lily Kocher, PA-C.  Electronically Signed: Hilda Lias, ED Scribe. 04/07/2015. 6:31 PM.    Chief Complaint  Patient presents with  . Back Pain      Patient is a 75 y.o. female presenting with back pain. The history is provided by the patient. No language interpreter was used.  Back Pain Location:  Lumbar spine Radiates to:  Does not radiate Pain severity:  Moderate Pain is:  Worse during the night Onset quality:  Gradual Duration:  2 months Timing:  Constant Progression:  Worsening Chronicity:  Recurrent Context: lifting heavy objects   Relieved by:  Being still Worsened by:  Lying down Ineffective treatments:  Muscle relaxants   HPI Comments: Courtney Arnold is a 75 y.o. female who presents to the Emergency Department complaining of constant, worsening bilateral lower back pain that has been present for 2 months. Pt reports she has been lifting her husband out of her bed at home because he has cancer and cannot get up on his own. Pt reports a hx of lower back surgery. Pt reports laying down makes her pain worse and sitting up makes it better. Pt reports seeing her PCP Dr. Hilma Favors 3 days ago and receiving a shot in addition to muscle relaxers for the same symptoms. Pt called Dr. Hilma Favors today, and was advised to come to ED because she would likely receive a CT or MRI on her lower back faster. Pt denies loss of control of bowels or bladder.   Past Medical History  Diagnosis Date  . Compression fracture 07/24/09    T12; kyphoplasty  . Atrial fibrillation ([place]) 2011    Postop, spontaneous conversion to normal sinus after one hour  . Tobacco abuse   . History of echocardiogram 5/11    EF 65%   Past Surgical History  Procedure Laterality Date  . Back surgery     Family History   Problem Relation Age of Onset  . Heart attack Father   . Stroke Father   . Breast cancer Sister    Social History  Substance Use Topics  . Smoking status: Former Smoker -- 0.15 packs/day for 20 years  . Smokeless tobacco: None  . Alcohol Use: No   OB History    No data available     Review of Systems  Genitourinary: Negative for difficulty urinating and dyspareunia.  Musculoskeletal: Positive for myalgias, back pain and arthralgias.  All other systems reviewed and are negative.     Allergies  Review of patient's allergies indicates no known allergies.  Home Medications   Prior to Admission medications   Medication Sig Start Date End Date Taking? Authorizing Provider  Naproxen Sodium (ALEVE) 220 MG CAPS Take 220 mg by mouth as needed.      Historical Provider, MD   BP 156/62 mmHg  Pulse 96  Temp(Src) 99.9 F (37.7 C) (Tympanic)  Resp 20  Ht 5\' 2"  (1.575 m)  Wt 117 lb (53.071 kg)  BMI 21.39 kg/m2  SpO2 98% Physical Exam  Constitutional: She is oriented to person, place, and time. She appears well-developed and well-nourished.  HENT:  Head: Normocephalic and atraumatic.  Cardiovascular: Normal rate.   Frequent irregular beats  Pulmonary/Chest: Effort normal and breath sounds normal. No respiratory distress. She has no wheezes. She  has no rales. She exhibits no tenderness.  Abdominal: She exhibits no distension.  Musculoskeletal:  DP pulses 2+ bilaterally Pain to palpation of lumbar and paraspinal lumbar muscles  Neurological: She is alert and oriented to person, place, and time.  Skin: Skin is warm and dry.  Psychiatric: She has a normal mood and affect.  Nursing note and vitals reviewed.   ED Course  Procedures (including critical care time)  DIAGNOSTIC STUDIES: Oxygen Saturation is 98% on room air, normal by my interpretation.    COORDINATION OF CARE: 6:28 PM Discussed treatment plan with pt at bedside and pt agreed to plan. Pt will receive pain  medication, CT lumbar.    Labs Review Labs Reviewed - No data to display  Imaging Review No results found. I have personally reviewed and evaluated these images and lab results as part of my medical decision-making.   EKG Interpretation None      MDM Vital signs are well within normal limits. Pulse oximetry is 96% on room air, within normal limits by my interpretation.  CT lumbar scan reveals lumbar spondylosis and degenerative disc disease without acute osseous findings. There is a status post T12 vertebral augmentation present.   patient seen with me by Dr. Wyvonnia Dusky. Patient states that she has pain from between her shoulder blades all way down to her back. A CT angiogram of the abdomen and pelvis was ordered. The patient has a T6 compression fracture with moderate height loss. This appears to be subacute. There is no evidence of any acute aortic syndrome. There is noted a dilated gallbladder and biliary tree without visible cause.  Complete blood count was nonacute. Compensatory metabolic panel was nonacute. Lipase was normal at 27.  The patient is ambulatory. The patient gets relief from the Percocet tablets that were given.  The patient will have an outpatient ultrasound for additional evaluation of the dilated gallbladder and biliary tree.  Prescription for Percocet one every 6 hours was given to the patient. The patient and the family will want to observe for lightheadedness, drowsiness, and constipation. The family is in agreement with this discharge plan.    Final diagnoses:  Thoracic back pain  Thoracic compression fracture, closed, initial encounter ([place])    *I have reviewed nursing notes, vital signs, and all appropriate lab and imaging results for this patient.**  **I personally performed the services described in this documentation, which was scribed in my presence. The recorded information has been reviewed and is accurate.Lily Kocher, PA-C 04/08/15  [place], MD 04/08/15 [phone]

## 2015-04-07 NOTE — Discharge Instructions (Signed)
Your CT scan reveals an old compression fracture at T6. The fracture at T12 that was repaired some years ago seems to be stable. There is noted dilation of your, and bile duct on the CT scan. No stones were seen. We are scheduling an ultrasound of your gallbladder to make sure that there are no hidden stones or other problems to cause an abnormality. Please use Tylenol for mild pain, may use Percocet for more severe pain. Percocet may cause drowsiness, and may cause constipation. Please use a stool softener Puente taking the Percocet tablets. Please see Dr. Hilma Favors for additional evaluation and management. Please come to the Radiology Dept at 7:45 tomorrow morning for your ultrasound. Spinal Compression Fracture A spinal compression fracture is a collapse of the bones that form the spine (vertebrae). With this type of fracture, the vertebrae become squashed (compressed) into a wedge shape. Most compression fractures happen in the middle or lower part of the spine. CAUSES This condition may be caused by:  Thinning and loss of density in the bones (osteoporosis). This is the most common cause.  A fall.  A car or motorcycle accident.  Cancer.  Trauma, such as a heavy, direct hit to the head. RISK FACTORS You may be at greater risk for a spinal compression fracture if you:  Are 74 years old or older.  Have osteoporosis.  Have certain types of cancer, including:  Multiple myeloma.  Lymphoma.  Prostate cancer.  Lung cancer.  Breast cancer. SYMPTOMS Symptoms of this condition include:  Severe pain.  Pain that gets worse over time.  Pain that is worse when you stand, walk, sit, or bend.  Sudden pain that is so bad that it is hard for you to move.  Bending or humping of the spine.  Gradual loss of height.  Numbness, tingling, or weakness in the back and legs.  Trouble walking. Your symptoms will depend on the cause of the fracture and how quickly it develops. For example,  fractures that are caused by osteoporosis can cause few symptoms, no symptoms, or symptoms that develop slowly over time. DIAGNOSIS This condition may be diagnosed based on symptoms, medical history, and a physical exam. During the physical exam, your health care provider may tap along the length of your spine to check for tenderness. Tests may be done to confirm the diagnosis. They may include:  A bone density test to check for osteoporosis.  Imaging tests, such as a spine X-ray, a CT scan, or MRI. TREATMENT Treatment for this condition depends on the cause and severity of the condition.Some fractures, such as those that are caused by osteoporosis, may heal on their own with supportive care. This may include:  Pain medicine.  Rest.  A back brace.  Physical therapy exercises.  Medicine that reduces bone pain.  Calcium and vitamin D supplements. Fractures that cause the back to become misshapen, cause nerve pain or weakness, or do not respond to other treatment may be treated with a surgical procedure, such as:  Vertebroplasty. In this procedure, bone cement is injected into the collapsed vertebrae to stabilize them.  Balloon kyphoplasty. In this procedure, the collapsed vertebrae are expanded with a balloon and then bone cement is injected into them.  Spinal fusion. In this procedure, the collapsed vertebrae are connected (fused) to normal vertebrae. HOME CARE INSTRUCTIONS General Instructions  Take medicines only as directed by your health care provider.  Do not drive or operate heavy machinery while taking pain medicine.  If directed, apply  ice to the injured area:  Put ice in a plastic bag.  Place a towel between your skin and the bag.  Leave the ice on for 30 minutes every two hours at first. Then apply the ice as needed.  Wear your neck brace or back brace as directed by your health care provider.  Do not drink alcohol. Alcohol can interfere with your  treatment.  Keep all follow-up visits as directed by your health care provider. This is important. It can help to prevent permanent injury, disability, and long-lasting (chronic) pain. Activity  Stay in bed (on bed rest) only as directed by your health care provider. Being on bed rest for too long can make your condition worse.  Return to your normal activities as directed by your health care provider. Ask what activities are safe for you.  Do exercises to improve motion and strength in your back (physical therapy), as recommended by your health care provider.  Exercise regularly as directed by your health care provider. SEEK MEDICAL CARE IF:  You have a fever.  You develop a cough that makes your pain worse.  Your pain medicine is not helping.  Your pain does not get better over time.  You cannot return to your normal activities as planned or expected. SEEK IMMEDIATE MEDICAL CARE IF:  Your pain is very bad and it suddenly gets worse.  You are unable to move any body part (paralysis) that is below the level of your injury.  You have numbness, tingling, or weakness in any body part that is below the level of your injury.  You cannot control your bladder or bowels.   This information is not intended to replace advice given to you by your health care provider. Make sure you discuss any questions you have with your health care provider.   Document Released: 02/22/2005 Document Revised: 07/09/2014 Document Reviewed: 02/26/2014 Elsevier Interactive Patient Education [employer].  Please do not eat or drink after midnight tonight.

## 2015-04-07 NOTE — ED Notes (Signed)
PA at bedside.

## 2015-04-07 NOTE — ED Notes (Signed)
Pt reports lower back pain after lifting husband in and out of bed for two months.  Pt has some difficult walking.

## 2015-04-08 ENCOUNTER — Ambulatory Visit (HOSPITAL_COMMUNITY)
Admission: RE | Admit: 2015-04-08 | Discharge: 2015-04-08 | Disposition: A | Discharge status: Home / Self Care | Payer: Medicare Other | Source: Ambulatory Visit | Attending: Physician Assistant | Admitting: Physician Assistant

## 2015-04-08 DIAGNOSIS — R932 Abnormal findings on diagnostic imaging of liver and biliary tract: Secondary | ICD-10-CM

## 2015-04-08 DIAGNOSIS — K8020 Calculus of gallbladder without cholecystitis without obstruction: Secondary | ICD-10-CM | POA: Diagnosis not present

## 2015-04-08 DIAGNOSIS — K828 Other specified diseases of gallbladder: Secondary | ICD-10-CM | POA: Insufficient documentation

## 2015-04-10 DIAGNOSIS — S22000A Wedge compression fracture of unspecified thoracic vertebra, initial encounter for closed fracture: Secondary | ICD-10-CM | POA: Diagnosis not present

## 2015-04-12 ENCOUNTER — Emergency Department (HOSPITAL_COMMUNITY): Payer: Medicare Other

## 2015-04-12 ENCOUNTER — Inpatient Hospital Stay (HOSPITAL_COMMUNITY)
Admission: EM | Admit: 2015-04-12 | Discharge: 2015-04-15 | DRG: 446 | Disposition: A | Discharge status: Home / Self Care | Payer: Medicare Other | Attending: Internal Medicine | Admitting: Internal Medicine

## 2015-04-12 ENCOUNTER — Encounter (HOSPITAL_COMMUNITY): Payer: Self-pay | Admitting: *Deleted

## 2015-04-12 DIAGNOSIS — K838 Other specified diseases of biliary tract: Secondary | ICD-10-CM | POA: Diagnosis not present

## 2015-04-12 DIAGNOSIS — E0590 Thyrotoxicosis, unspecified without thyrotoxic crisis or storm: Secondary | ICD-10-CM | POA: Diagnosis not present

## 2015-04-12 DIAGNOSIS — R109 Unspecified abdominal pain: Secondary | ICD-10-CM | POA: Diagnosis not present

## 2015-04-12 DIAGNOSIS — K8050 Calculus of bile duct without cholangitis or cholecystitis without obstruction: Secondary | ICD-10-CM

## 2015-04-12 DIAGNOSIS — Z803 Family history of malignant neoplasm of breast: Secondary | ICD-10-CM

## 2015-04-12 DIAGNOSIS — K8020 Calculus of gallbladder without cholecystitis without obstruction: Secondary | ICD-10-CM | POA: Diagnosis not present

## 2015-04-12 DIAGNOSIS — K8071 Calculus of gallbladder and bile duct without cholecystitis with obstruction: Secondary | ICD-10-CM | POA: Diagnosis not present

## 2015-04-12 DIAGNOSIS — F17210 Nicotine dependence, cigarettes, uncomplicated: Secondary | ICD-10-CM | POA: Diagnosis present

## 2015-04-12 DIAGNOSIS — T148 Other injury of unspecified body region: Secondary | ICD-10-CM | POA: Diagnosis not present

## 2015-04-12 DIAGNOSIS — D649 Anemia, unspecified: Secondary | ICD-10-CM | POA: Diagnosis not present

## 2015-04-12 DIAGNOSIS — H0520 Unspecified exophthalmos: Secondary | ICD-10-CM | POA: Diagnosis present

## 2015-04-12 DIAGNOSIS — D135 Benign neoplasm of extrahepatic bile ducts: Secondary | ICD-10-CM | POA: Diagnosis not present

## 2015-04-12 DIAGNOSIS — S32049A Unspecified fracture of fourth lumbar vertebra, initial encounter for closed fracture: Secondary | ICD-10-CM

## 2015-04-12 DIAGNOSIS — Z823 Family history of stroke: Secondary | ICD-10-CM | POA: Diagnosis not present

## 2015-04-12 DIAGNOSIS — R112 Nausea with vomiting, unspecified: Secondary | ICD-10-CM | POA: Diagnosis not present

## 2015-04-12 DIAGNOSIS — Z800 Family history of malignant neoplasm of digestive organs: Secondary | ICD-10-CM | POA: Diagnosis not present

## 2015-04-12 DIAGNOSIS — K8021 Calculus of gallbladder without cholecystitis with obstruction: Secondary | ICD-10-CM | POA: Diagnosis not present

## 2015-04-12 DIAGNOSIS — F17200 Nicotine dependence, unspecified, uncomplicated: Secondary | ICD-10-CM | POA: Diagnosis not present

## 2015-04-12 DIAGNOSIS — I482 Chronic atrial fibrillation: Secondary | ICD-10-CM | POA: Diagnosis present

## 2015-04-12 DIAGNOSIS — R1084 Generalized abdominal pain: Secondary | ICD-10-CM | POA: Diagnosis not present

## 2015-04-12 DIAGNOSIS — Z8249 Family history of ischemic heart disease and other diseases of the circulatory system: Secondary | ICD-10-CM

## 2015-04-12 DIAGNOSIS — IMO0002 Reserved for concepts with insufficient information to code with codable children: Secondary | ICD-10-CM

## 2015-04-12 LAB — URINALYSIS, ROUTINE W REFLEX MICROSCOPIC
Bilirubin Urine: NEGATIVE
Glucose, UA: NEGATIVE mg/dL
Ketones, ur: NEGATIVE mg/dL
Nitrite: NEGATIVE
Protein, ur: NEGATIVE mg/dL
Specific Gravity, Urine: 1.01 (ref 1.005–1.030)
pH: 5.5 (ref 5.0–8.0)

## 2015-04-12 LAB — CBC WITH DIFFERENTIAL/PLATELET
Basophils Absolute: 0 10*3/uL (ref 0.0–0.1)
Basophils Relative: 0 %
Eosinophils Absolute: 0.3 10*3/uL (ref 0.0–0.7)
Eosinophils Relative: 4 %
HCT: 33.1 % — ABNORMAL LOW (ref 36.0–46.0)
Hemoglobin: 10.6 g/dL — ABNORMAL LOW (ref 12.0–15.0)
Lymphocytes Relative: 33 %
Lymphs Abs: 2.2 10*3/uL (ref 0.7–4.0)
MCH: 26.4 pg (ref 26.0–34.0)
MCHC: 32 g/dL (ref 30.0–36.0)
MCV: 82.3 fL (ref 78.0–100.0)
Monocytes Absolute: 0.7 10*3/uL (ref 0.1–1.0)
Monocytes Relative: 10 %
Neutro Abs: 3.6 10*3/uL (ref 1.7–7.7)
Neutrophils Relative %: 53 %
Platelets: 238 10*3/uL (ref 150–400)
RBC: 4.02 MIL/uL (ref 3.87–5.11)
RDW: 14.3 % (ref 11.5–15.5)
WBC: 6.7 10*3/uL (ref 4.0–10.5)

## 2015-04-12 LAB — COMPREHENSIVE METABOLIC PANEL
ALT: 35 U/L (ref 14–54)
AST: 44 U/L — ABNORMAL HIGH (ref 15–41)
Albumin: 3.8 g/dL (ref 3.5–5.0)
Alkaline Phosphatase: 210 U/L — ABNORMAL HIGH (ref 38–126)
Anion gap: 10 (ref 5–15)
BUN: 15 mg/dL (ref 6–20)
CO2: 31 mmol/L (ref 22–32)
Calcium: 9.5 mg/dL (ref 8.9–10.3)
Chloride: 99 mmol/L — ABNORMAL LOW (ref 101–111)
Creatinine, Ser: 0.38 mg/dL — ABNORMAL LOW (ref 0.44–1.00)
GFR calc Af Amer: >60 mL/min (ref >60)
GFR calc non Af Amer: >60 mL/min (ref >60)
Glucose, Bld: 121 mg/dL — ABNORMAL HIGH (ref 65–99)
Potassium: 3.4 mmol/L — ABNORMAL LOW (ref 3.5–5.1)
Sodium: 140 mmol/L (ref 135–145)
Total Bilirubin: 0.8 mg/dL (ref 0.3–1.2)
Total Protein: 7.3 g/dL (ref 6.5–8.1)

## 2015-04-12 LAB — TROPONIN I: Troponin I: <0.03 ng/mL (ref <0.031)

## 2015-04-12 LAB — URINE MICROSCOPIC-ADD ON

## 2015-04-12 LAB — LIPASE, BLOOD: Lipase: 28 U/L (ref 11–51)

## 2015-04-12 LAB — LACTIC ACID, PLASMA: Lactic Acid, Venous: 0.8 mmol/L (ref 0.5–2.0)

## 2015-04-12 MED ORDER — HYDROMORPHONE HCL 1 MG/ML IJ SOLN
1.0000 mg | Freq: Once | INTRAMUSCULAR | Status: AC
  Administered 2015-04-12: 1 mg via INTRAVENOUS
  Filled 2015-04-12: qty 1

## 2015-04-12 MED ORDER — DIATRIZOATE MEGLUMINE & SODIUM 66-10 % PO SOLN
ORAL | Status: AC
  Administered 2015-04-12: 30 mL
  Filled 2015-04-12: qty 30

## 2015-04-12 MED ORDER — ONDANSETRON HCL 4 MG/2ML IJ SOLN
4.0000 mg | INTRAMUSCULAR | Status: DC | PRN
  Administered 2015-04-12: 4 mg via INTRAVENOUS
  Filled 2015-04-12: qty 2

## 2015-04-12 MED ORDER — MORPHINE SULFATE (PF) 4 MG/ML IV SOLN
4.0000 mg | INTRAVENOUS | Status: DC | PRN
  Administered 2015-04-12: 4 mg via INTRAVENOUS
  Filled 2015-04-12: qty 1

## 2015-04-12 MED ORDER — SODIUM CHLORIDE 0.9 % IV SOLN
INTRAVENOUS | Status: DC
  Administered 2015-04-12: 20:00:00 via INTRAVENOUS

## 2015-04-12 MED ORDER — IOHEXOL 300 MG/ML  SOLN
100.0000 mL | Freq: Once | INTRAMUSCULAR | Status: AC | PRN
  Administered 2015-04-12: 100 mL via INTRAVENOUS

## 2015-04-12 NOTE — H&P (Signed)
[facility] History and Physical  Courtney Arnold [id number] DOB: 09-Dec-1940    PCP:   Purvis Kilts, MD   Chief Complaint: Abdominal Pain  HPI: Courtney Arnold is an 75 y.o. female with a hx a compression fx at T6 presents with abdominal pain that began 1 week ago and has been worsening. Her pain is located in her diffuse abdomen and does not radiate. She admits to associated nausea and vomiting. She is unaware of any alleviating factors and has not taken anything for her symptoms PTA. She denies any diarrhea, CP, SOB, fever, chills, hematemesis, or melena. Patient states that was diagnosed with a T6 compression fracture months ago and is supposed to be scheduling surgery. While in the ED, labs revealed a unremarkable BMP, alkaline phosphatase 210, negative troponin and lactic acid, WBC of 6.7 and hgb of 10.6. Abdominal CT shows gallbladder distention and cholelithiasis without CT findings of acute cholecystitis. Worsening intra and extrahepatic biliary dilatation to level of mid common bile duct with there is apparent stenosis/stricture though, obstructing lesion not excluded. Hospitalist was asked to admit her for further workup.   Rewiew of Systems:  Constitutional: Negative for malaise, fever and chills. No significant weight loss or weight gain Eyes: Negative for eye pain, redness and discharge, diplopia, visual changes, or flashes of light. ENMT: Negative for ear pain, hoarseness, nasal congestion, sinus pressure and sore throat. No headaches; tinnitus, drooling, or problem swallowing. Cardiovascular: Negative for chest pain, palpitations, diaphoresis, dyspnea and peripheral edema. ; No orthopnea, PND Respiratory: Negative for cough, hemoptysis, wheezing and stridor. No pleuritic chestpain. Gastrointestinal: Positive for abdominal pain, nausea, vomiting. Negative for  diarrhea, constipation, melena, blood in stool, hematemesis, jaundice and rectal bleeding.     Genitourinary: Negative for frequency, dysuria, incontinence,flank pain and hematuria; Musculoskeletal: Negative for back pain and neck pain. Negative for swelling and trauma.;  Skin: . Negative for pruritus, rash, abrasions, bruising and skin lesion.; ulcerations Neuro: Negative for headache, lightheadedness and neck stiffness. Negative for weakness, altered level of consciousness , altered mental status, extremity weakness, burning feet, involuntary movement, seizure and syncope.  Psych: negative for anxiety, depression, insomnia, tearfulness, panic attacks, hallucinations, paranoia, suicidal or homicidal ideation    Past Medical History  Diagnosis Date  . Compression fracture 07/24/09    T12; kyphoplasty  . Atrial fibrillation ([place]) 2011    Postop, spontaneous conversion to normal sinus after one hour  . Tobacco abuse   . History of echocardiogram 5/11    EF 65%    Past Surgical History  Procedure Laterality Date  . Back surgery      Medications:  HOME MEDS: Prior to Admission medications   Medication Sig Start Date End Date Taking? Authorizing Provider  aspirin EC 81 MG tablet Take 81 mg by mouth daily.   Yes Historical Provider, MD  Naproxen Sodium (ALEVE) 220 MG CAPS Take 220 mg by mouth daily as needed (for pain).    Yes Historical Provider, MD  oxyCODONE-acetaminophen (PERCOCET/ROXICET) 5-325 MG tablet Take 1 tablet by mouth every 6 (six) hours as needed. Patient taking differently: Take 1 tablet by mouth every 6 (six) hours as needed for moderate pain or severe pain.  04/07/15  Yes Lily Kocher, PA-C     Allergies:  No Known Allergies  Social History:   reports that she has quit smoking. She does not have any smokeless tobacco history on file. She reports that she does not drink alcohol or use illicit drugs.  Family History:  Family History  Problem Relation Age of Onset  . Heart attack Father   . Stroke Father   . Breast cancer Sister      Physical  Exam: Filed Vitals:   04/12/15 1946 04/12/15 1947  BP: 126/92   Pulse: 116   Temp: 98.4 F (36.9 C)   TempSrc: Oral   Resp: 22 22  Height: 5\' 2"  (1.575 m)   Weight: 55.339 kg (122 lb)   SpO2: 96%    Blood pressure 126/92, pulse 116, temperature 98.4 F (36.9 C), temperature source Oral, resp. rate 22, height 5\' 2"  (1.575 m), weight 55.339 kg (122 lb), SpO2 96 %.  GEN:  Pleasant. Patient lying in the stretcher in no acute distress; cooperative with exam. PSYCH:  alert and oriented x4; does not appear anxious or depressed; affect is appropriate. HEENT: Mucous membranes pink and anicteric; PERRLA; EOM intact; no cervical lymphadenopathy nor thyromegaly or carotid bruit; no JVD; There were no stridor. Neck is very supple. Breasts:: Not examined CHEST WALL: No tenderness CHEST: Normal respiration, clear to auscultation bilaterally.  HEART: Regular rate and rhythm.  There are no murmur, rub, or gallops.   BACK: No kyphosis or scoliosis; no CVA tenderness ABDOMEN: TTP over epigastrium and RUQ. No masses, no organomegaly, normal abdominal bowel sounds; no pannus; no intertriginous candida. There is no rebound and no distention. Non-acute abdomen. Rectal Exam: Not done EXTREMITIES: No bone or joint deformity; age-appropriate arthropathy of the hands and knees; no edema; no ulcerations.  There is no calf tenderness. Genitalia: not examined PULSES: 2+ and symmetric SKIN: Normal hydration no rash or ulceration CNS: Cranial nerves 2-12 grossly intact no focal lateralizing neurologic deficit.  Speech is fluent; uvula elevated with phonation, facial symmetry and tongue midline. DTR are normal bilaterally, cerebella exam is intact, barbinski is negative and strengths are equaled bilaterally.  No sensory loss.   Labs on Admission:  Basic Metabolic Panel:  Recent Labs Lab 04/07/15 2002 04/07/15 2132 04/12/15 2023  NA 140 137 140  K 3.7 3.8 3.4*  CL 101 101 99*  CO2  --  29 31  GLUCOSE 107*  105* 121*  BUN 15 15 15   CREATININE 0.40* 0.40* 0.38*  CALCIUM  --  9.3 9.5   Liver Function Tests:  Recent Labs Lab 04/07/15 2132 04/12/15 2023  AST 18 44*  ALT 16 35  ALKPHOS 118 210*  BILITOT 0.5 0.8  PROT 6.4* 7.3  ALBUMIN 3.4* 3.8    Recent Labs Lab 04/07/15 2132 04/12/15 2023  LIPASE 27 28   CBC:  Recent Labs Lab 04/07/15 2002 04/07/15 2132 04/12/15 2023  WBC  --  6.8 6.7  NEUTROABS  --  3.5 3.6  HGB 11.2* 10.0* 10.6*  HCT 33.0* 31.3* 33.1*  MCV  --  81.7 82.3  PLT  --  214 238   Cardiac Enzymes:  Recent Labs Lab 04/12/15 2023  TROPONINI <0.03     Radiological Exams on Admission: Ct Abdomen Pelvis W Contrast  04/12/2015  CLINICAL DATA:  Nausea, vomiting and mid abdominal pain for 1 day. History of spinal fractures, atrial fibrillation. EXAM: CT ABDOMEN AND PELVIS WITH CONTRAST TECHNIQUE: Multidetector CT imaging of the abdomen and pelvis was performed using the standard protocol following bolus administration of intravenous contrast. CONTRAST:  146mL OMNIPAQUE IOHEXOL 300 MG/ML  SOLN COMPARISON:  RIGHT upper quadrant ultrasound April 08, 2015 and CT thoracolumbar spine April 07, 2015. FINDINGS: LUNG BASES: Included view of the lung bases are clear. Visualized heart  and pericardium are unremarkable. SOLID ORGANS: The gallbladder is very distended, with subcentimeter gallstone. No gallbladder wall thickening or pericholecystic fluid. Moderate intrahepatic biliary dilatation to the level of the mid Common bile duct without CT findings of choledocholithiasis. Common bile duct was 11 mm, now 14 mm. No pancreatic head mass. No significant pancreatic ductal dilatation. Spleen, adrenal glands are normal. GASTROINTESTINAL TRACT: The stomach, small and large bowel are normal in course and caliber without inflammatory changes. Mild descending colon and sigmoid diverticulosis. Moderate amount of retained ascending colonic stool. Normal appendix. KIDNEYS/ URINARY TRACT:  Kidneys are orthotopic, demonstrating symmetric enhancement. No nephrolithiasis, hydronephrosis or solid renal masses. 12 mm simple cyst upper pole RIGHT kidney. Too small to characterize hypodensities in the kidneys bilaterally. 3.2 cm LEFT lower pole cyst. The unopacified ureters are normal in course and caliber. Delayed imaging through the kidneys demonstrates symmetric prompt contrast excretion within the proximal urinary collecting system. Urinary bladder is well distended and unremarkable. PERITONEUM/RETROPERITONEUM: Aortoiliac vessels are normal in course and caliber, mild calcific atherosclerosis. No lymphadenopathy by CT size criteria. Internal reproductive organs are unremarkable. No intraperitoneal free fluid nor free air. SOFT TISSUE/OSSEOUS STRUCTURES: Patient is osteopenic. New moderate L4 burst fracture with 30-50% height loss, 4 mm retro post bony fragments. Severe L5-S1 degenerative disc. Status post T12 vertebral body cement augmentation without progressed height loss. Punctate calcification RIGHT breast. IMPRESSION: New moderate L4 burst fracture, 4 mm retropulsed bony fragments without malalignment. Status post T12 vertebral body cement augmentation with stable height loss. Gallbladder distention and cholelithiasis without CT findings of acute cholecystitis. Worsening intra and extrahepatic biliary dilatation to level of mid Common bile duct with there is apparent stenosis/stricture though, obstructing lesion not excluded. Electronically Signed   By: Elon Alas M.D.   On: 04/12/2015 22:57    EKG: Independently reviewed.    Assessment/Plan  Cholelithiasis Possible choledocholithiasis with no acute cholecystitis. Chronic afib. Compressive fracture of back.   PLAN: Patient will be admitted for cholelithiasis and biliary dilation. Cannot rule out malignancy. Will treat symptomatically with IVF,  IV Dilaudid, and Zofran.  She will be given bowel rest.  Will not give Morphine to  avoid biliary spasm.  She has no evidence of acute cholecystitis, but suspicious for choledocholithiasis.  GI will be consulted in the a.m. Will defer MRCP or ERCP to GI.  She ultimately will need lap CCY, and surgery may choose to perform intraoperative cholangiogram if her stone is passed.  There is no worsening of her back pain and no evidence of cord compression. She can follow up as an outpatient.   Other plans as per orders.  Code Status: Full   Orvan Falconer MD  FACP [facility] Pager [phone] 7pm to 7am.  04/12/2015, 11:14 PM   By signing my name below, I, Rosalie Doctor, attest that this documentation has been prepared under the direction and in the presence of Orvan Falconer MD Electronically Signed: Rosalie Doctor, Scribe. 04/12/2015

## 2015-04-12 NOTE — ED Provider Notes (Signed)
CSN: [id number]     Arrival date & time 04/12/15  1930 History   First MD Initiated Contact with Patient 04/12/15 1947     Chief Complaint  Patient presents with  . Abdominal Pain      HPI Pt was seen at [facility].  Per pt, c/o gradual onset and persistence of constant generalized abd "pain" for the past 1 week, worse over the past day. Has been associated with multiple intermittent episodes of N/V. Pt was recently evaluated in the ED for back pain, dx new T6 compression fracture. Pt states she did f/u with Neurosurgeon this past week for this finding. Denies diarrhea, no fevers, no new back pain, no rash, no CP/SOB, no black or blood in stools or emesis.       Past Medical History  Diagnosis Date  . Compression fracture 07/24/09    T12; kyphoplasty  . Atrial fibrillation ([place]) 2011    Postop, spontaneous conversion to normal sinus after one hour  . Tobacco abuse   . History of echocardiogram 5/11    EF 65%   Past Surgical History  Procedure Laterality Date  . Back surgery     Family History  Problem Relation Age of Onset  . Heart attack Father   . Stroke Father   . Breast cancer Sister    Social History  Substance Use Topics  . Smoking status: Former Smoker -- 0.15 packs/day for 20 years  . Smokeless tobacco: None  . Alcohol Use: No    Review of Systems ROS: Statement: All systems negative except as marked or noted in the HPI; Constitutional: Negative for fever and chills. ; ; Eyes: Negative for eye pain, redness and discharge. ; ; ENMT: Negative for ear pain, hoarseness, nasal congestion, sinus pressure and sore throat. ; ; Cardiovascular: Negative for chest pain, palpitations, diaphoresis, dyspnea and peripheral edema. ; ; Respiratory: Negative for cough, wheezing and stridor. ; ; Gastrointestinal: +N/V, abd pain. Negative for diarrhea, blood in stool, hematemesis, jaundice and rectal bleeding. . ; ; Genitourinary: Negative for dysuria, flank pain and hematuria. ; ;  Musculoskeletal: Negative for neck pain. Negative for swelling and trauma.; ; Skin: Negative for pruritus, rash, abrasions, blisters, bruising and skin lesion.; ; Neuro: Negative for headache, lightheadedness and neck stiffness. Negative for weakness, altered level of consciousness , altered mental status, extremity weakness, paresthesias, involuntary movement, seizure and syncope.      Allergies  Review of patient's allergies indicates no known allergies.  Home Medications   Prior to Admission medications   Medication Sig Start Date End Date Taking? Authorizing Provider  aspirin EC 81 MG tablet Take 81 mg by mouth daily.    Historical Provider, MD  Naproxen Sodium (ALEVE) 220 MG CAPS Take 220 mg by mouth daily as needed (for pain).     Historical Provider, MD  oxyCODONE-acetaminophen (PERCOCET/ROXICET) 5-325 MG tablet Take 1 tablet by mouth every 6 (six) hours as needed. 04/07/15   Lily Kocher, PA-C   BP 126/92 mmHg  Pulse 116  Temp(Src) 98.4 F (36.9 C) (Oral)  Resp 22  Ht 5\' 2"  (1.575 m)  Wt 122 lb (55.339 kg)  BMI 22.31 kg/m2  SpO2 96% Physical Exam  2000: Physical examination:  Nursing notes reviewed; Vital signs and O2 SAT reviewed;  Constitutional: Well developed, Well nourished, Uncomfortable appearing.; Head:  Normocephalic, atraumatic; Eyes: EOMI, PERRL, No scleral icterus; ENMT: Mouth and pharynx normal, Mucous membranes dry; Neck: Supple, Full range of motion, No lymphadenopathy; Cardiovascular: Tachycardic rate  and rhythm, No gallop; Respiratory: Breath sounds clear & equal bilaterally, No wheezes.  Speaking full sentences with ease, Normal respiratory effort/excursion; Chest: Nontender, Movement normal; Abdomen: Soft, +generalized tenderness to palp, esp RLQ and LLQ. No rebound or guarding. Nondistended, Normal bowel sounds; Genitourinary: No CVA tenderness; Extremities: Pulses normal, No tenderness, No edema, No calf edema or asymmetry.; Neuro: AA&Ox3, Major CN grossly  intact.  Speech clear. No gross focal motor or sensory deficits in extremities.; Skin: Color normal, Warm, Dry.   ED Course  Procedures (including critical care time) Labs Review  Imaging Review  I have personally reviewed and evaluated these images and lab results as part of my medical decision-making.   EKG Interpretation   Date/Time:  Saturday April 12 2015 21:30:29 EST Ventricular Rate:  108 PR Interval:  191 QRS Duration: 92 QT Interval:  320 QTC Calculation: 429 R Axis:   75 Text Interpretation:  Sinus tachycardia Nonspecific ST and T wave  abnormality Baseline wander When compared with ECG of 09/19/2009 Rate  faster Confirmed by [facility]  MD, Nunzio Cory [phone]) on 04/12/2015 11:19:59 PM      MDM  MDM Reviewed: previous chart, nursing note and vitals Reviewed previous: labs and ECG Interpretation: labs, CT scan and ECG      Results for orders placed or performed during the hospital encounter of 04/12/15  Comprehensive metabolic panel  Result Value Ref Range   Sodium 140 135 - 145 mmol/L   Potassium 3.4 (L) 3.5 - 5.1 mmol/L   Chloride 99 (L) 101 - 111 mmol/L   CO2 31 22 - 32 mmol/L   Glucose, Bld 121 (H) 65 - 99 mg/dL   BUN 15 6 - 20 mg/dL   Creatinine, Ser 0.38 (L) 0.44 - 1.00 mg/dL   Calcium 9.5 8.9 - 10.3 mg/dL   Total Protein 7.3 6.5 - 8.1 g/dL   Albumin 3.8 3.5 - 5.0 g/dL   AST 44 (H) 15 - 41 U/L   ALT 35 14 - 54 U/L   Alkaline Phosphatase 210 (H) 38 - 126 U/L   Total Bilirubin 0.8 0.3 - 1.2 mg/dL   GFR calc non Af Amer >60 >60 mL/min   GFR calc Af Amer >60 >60 mL/min   Anion gap 10 5 - 15  Lipase, blood  Result Value Ref Range   Lipase 28 11 - 51 U/L  Lactic acid, plasma  Result Value Ref Range   Lactic Acid, Venous 0.8 0.5 - 2.0 mmol/L  CBC with Differential  Result Value Ref Range   WBC 6.7 4.0 - 10.5 K/uL   RBC 4.02 3.87 - 5.11 MIL/uL   Hemoglobin 10.6 (L) 12.0 - 15.0 g/dL   HCT 33.1 (L) 36.0 - 46.0 %   MCV 82.3 78.0 - 100.0 fL   MCH  26.4 26.0 - 34.0 pg   MCHC 32.0 30.0 - 36.0 g/dL   RDW 14.3 11.5 - 15.5 %   Platelets 238 150 - 400 K/uL   Neutrophils Relative % 53 %   Neutro Abs 3.6 1.7 - 7.7 K/uL   Lymphocytes Relative 33 %   Lymphs Abs 2.2 0.7 - 4.0 K/uL   Monocytes Relative 10 %   Monocytes Absolute 0.7 0.1 - 1.0 K/uL   Eosinophils Relative 4 %   Eosinophils Absolute 0.3 0.0 - 0.7 K/uL   Basophils Relative 0 %   Basophils Absolute 0.0 0.0 - 0.1 K/uL  Troponin I  Result Value Ref Range   Troponin I <0.03 <0.031 ng/mL  Ct Lumbar Spine Wo Contrast 04/07/2015  CLINICAL DATA:  Low back pain after repetitive lifting for the past 2 months. Prior vertebral augmentation. EXAM: CT LUMBAR SPINE WITHOUT CONTRAST TECHNIQUE: Multidetector CT imaging of the lumbar spine was performed without intravenous contrast administration. Multiplanar CT image reconstructions were also generated. COMPARISON:  Plain films of 07/01/2009 and MRI of 06/10/2009. FINDINGS: Soft tissues: Motion degradation. Normal aortic caliber with atherosclerosis within. No gross hydronephrosis or retroperitoneal process identified. Bones: Spinal visualization from the top of T12 to the upper sacrum. Status post vertebral augmentation at T12 secondary to an underlying moderate compression deformity. Lumbar vertebral body height is maintained. Loss of intervertebral disc height at the lumbosacral junction. Mild disc bulges at L3-4 and L4-5. Combined with facet arthropathy and ligamentum flavum thickening, these result in mild central canal stenosis. IMPRESSION: 1. Lumbar spondylosis and degenerative disc disease, without acute osseous finding. 2. Status post T12 vertebral augmentation. Electronically Signed   By: Abigail Miyamoto M.D.   On: 04/07/2015 19:17   Ct Angio Chest Aorta W/cm &/or Wo/cm 04/07/2015  CLINICAL DATA:  Thoracic back pain from shoulder blades with lower back for 2 months. EXAM: CT ANGIOGRAPHY CHEST, ABDOMEN AND PELVIS TECHNIQUE: Multidetector CT imaging  through the chest, abdomen and pelvis was performed using the standard protocol during bolus administration of intravenous contrast. Multiplanar reconstructed images and MIPs were obtained and reviewed to evaluate the vascular anatomy. CONTRAST:  178mL OMNIPAQUE IOHEXOL 350 MG/ML SOLN COMPARISON:  Chest CT 01/16/2004 FINDINGS: CTA CHEST FINDINGS THORACIC INLET/BODY WALL: No acute abnormality. MEDIASTINUM: Normal heart size. No pericardial effusion. No evidence of acute aortic syndrome, including intramural hematoma, dissection, or penetrating ulcer. There is atherosclerosis of the aorta without aneurysm. Opacified pulmonary arteries are patent. No adenopathy. LUNG WINDOWS: No consolidation. No effusion. No suspicious pulmonary nodule. Mild scarring in the lingula and in the right lower lobe near a endplate spur. OSSEOUS: T6 compression fracture with a horizontal band of sclerosis in the upper body, extending vertically at the junction of the body and pedicles. Height loss is in the 25-50% range. No retropulsion. No discrete fracture line is seen and the injury is likely subacute. Remote T12 compression fracture status post cement augmentation. Usual degenerative changes, with focal advanced disc narrowing at L5-S1. Review of the MIP images confirms the above findings. CTA ABDOMEN AND PELVIS FINDINGS Abdominal wall:  No contributory findings. Hepatobiliary: No focal liver abnormality. Dilated gallbladder and biliary tree. Common bile duct at pancreatic head measures 12 mm in diameter. Cholelithiasis but no calcified choledocholithiasis. No evidence of ampullary region mass. No inflammatory changes around the gallbladder. Pancreas: Unremarkable. Spleen: Unremarkable. Adrenals/Urinary Tract: Negative adrenals. No hydronephrosis or stone. 25 mm left renal cyst. Unremarkable bladder. Reproductive:  Pelvic floor laxity. Stomach/Bowel:  No obstruction. No inflammatory changes. Vascular/Lymphatic: Standard aortic branching.  Atherosclerosis without aneurysm or dissection. No mass or adenopathy. Peritoneal: No ascites or pneumoperitoneum. Musculoskeletal: Please see above concerning thoracic spine fracture. Review of the MIP images confirms the above findings. IMPRESSION: 1. T6 compression fracture with moderate height loss, likely subacute. 2. No evidence for acute aortic syndrome. 3. Dilated gallbladder and biliary tree without visible cause (non-obstructive gallstone present). Correlate with liver function tests and consider sonography or cholangiography. Electronically Signed   By: Monte Fantasia M.D.   On: 04/07/2015 21:23    US Abdomen Limited Ruq 04/08/2015  CLINICAL DATA:  Gallbladder dilation seen on CT. EXAM: US ABDOMEN LIMITED - RIGHT UPPER QUADRANT COMPARISON:  CT of the chest abdomen  pelvis dated 04/07/2015 FINDINGS: Gallbladder: The gallbladder is prominent, however sub pathologically distended, measuring 4.5 cm transversely. There is no evidence of gallbladder wall thickening. Gallbladder wall measures 1.7 mm. There is a small bowel 1.5 cm gallbladder wall calculus. The sonographic Percell Miller sign is negative. Common bile duct: Diameter: The extrahepatic common bile duct is diffusely dilated measuring up to 1.1 cm. There is also a diffuse dilation of the main pancreatic duct which measures up to 3.7 mm. Liver: No focal lesion identified. Within normal limits in parenchymal echogenicity. Mild intra hepatic biliary ductal dilation is also seen. IMPRESSION: Cholelithiasis without evidence of acute cholecystitis. Prominent size of the gallbladder, although still sub pathologic. Diffuse dilation of the intra and extrahepatic bile ducts, and main pancreatic duct. Pancreas is not well visualized, as it was obscured by bowel gas. Obstruction at the level of the ampulla could not be excluded. Electronically Signed   By: Fidela Salisbury M.D.   On: 04/08/2015 08:43    Ct Abdomen Pelvis W Contrast 04/12/2015  CLINICAL DATA:   Nausea, vomiting and mid abdominal pain for 1 day. History of spinal fractures, atrial fibrillation. EXAM: CT ABDOMEN AND PELVIS WITH CONTRAST TECHNIQUE: Multidetector CT imaging of the abdomen and pelvis was performed using the standard protocol following bolus administration of intravenous contrast. CONTRAST:  155mL OMNIPAQUE IOHEXOL 300 MG/ML  SOLN COMPARISON:  RIGHT upper quadrant ultrasound April 08, 2015 and CT thoracolumbar spine April 07, 2015. FINDINGS: LUNG BASES: Included view of the lung bases are clear. Visualized heart and pericardium are unremarkable. SOLID ORGANS: The gallbladder is very distended, with subcentimeter gallstone. No gallbladder wall thickening or pericholecystic fluid. Moderate intrahepatic biliary dilatation to the level of the mid Common bile duct without CT findings of choledocholithiasis. Common bile duct was 11 mm, now 14 mm. No pancreatic head mass. No significant pancreatic ductal dilatation. Spleen, adrenal glands are normal. GASTROINTESTINAL TRACT: The stomach, small and large bowel are normal in course and caliber without inflammatory changes. Mild descending colon and sigmoid diverticulosis. Moderate amount of retained ascending colonic stool. Normal appendix. KIDNEYS/ URINARY TRACT: Kidneys are orthotopic, demonstrating symmetric enhancement. No nephrolithiasis, hydronephrosis or solid renal masses. 12 mm simple cyst upper pole RIGHT kidney. Too small to characterize hypodensities in the kidneys bilaterally. 3.2 cm LEFT lower pole cyst. The unopacified ureters are normal in course and caliber. Delayed imaging through the kidneys demonstrates symmetric prompt contrast excretion within the proximal urinary collecting system. Urinary bladder is well distended and unremarkable. PERITONEUM/RETROPERITONEUM: Aortoiliac vessels are normal in course and caliber, mild calcific atherosclerosis. No lymphadenopathy by CT size criteria. Internal reproductive organs are unremarkable.  No intraperitoneal free fluid nor free air. SOFT TISSUE/OSSEOUS STRUCTURES: Patient is osteopenic. New moderate L4 burst fracture with 30-50% height loss, 4 mm retro post bony fragments. Severe L5-S1 degenerative disc. Status post T12 vertebral body cement augmentation without progressed height loss. Punctate calcification RIGHT breast. IMPRESSION: New moderate L4 burst fracture, 4 mm retropulsed bony fragments without malalignment. Status post T12 vertebral body cement augmentation with stable height loss. Gallbladder distention and cholelithiasis without CT findings of acute cholecystitis. Worsening intra and extrahepatic biliary dilatation to level of mid Common bile duct with there is apparent stenosis/stricture though, obstructing lesion not excluded. Electronically Signed   By: Elon Alas M.D.   On: 04/12/2015 22:57    2310:  Worsening CBD dilatation with apparent stenosis/stricture. Will need MRCP/ERCP. New L4 fx, no neuro changes. Dx and testing d/w pt and family.  Questions answered.  Verb  understanding, agreeable to admit.   T/C to [facility]. [place] Comment, case discussed, including:  HPI, pertinent PM/SHx, VS/PE, dx testing, ED course and treatment:  Agreeable to come to ED for evaluation for admission.    Francine Graven, DO 04/13/15 [id number]

## 2015-04-12 NOTE — ED Notes (Signed)
Pt c/o abdominal pain and n/v since earlier today.

## 2015-04-13 ENCOUNTER — Encounter (HOSPITAL_COMMUNITY): Payer: Self-pay | Admitting: *Deleted

## 2015-04-13 DIAGNOSIS — K838 Other specified diseases of biliary tract: Secondary | ICD-10-CM

## 2015-04-13 DIAGNOSIS — K8050 Calculus of bile duct without cholangitis or cholecystitis without obstruction: Secondary | ICD-10-CM

## 2015-04-13 DIAGNOSIS — R109 Unspecified abdominal pain: Secondary | ICD-10-CM

## 2015-04-13 DIAGNOSIS — T148 Other injury of unspecified body region: Secondary | ICD-10-CM

## 2015-04-13 DIAGNOSIS — F17200 Nicotine dependence, unspecified, uncomplicated: Secondary | ICD-10-CM

## 2015-04-13 DIAGNOSIS — K8020 Calculus of gallbladder without cholecystitis without obstruction: Secondary | ICD-10-CM

## 2015-04-13 DIAGNOSIS — S32049A Unspecified fracture of fourth lumbar vertebra, initial encounter for closed fracture: Secondary | ICD-10-CM

## 2015-04-13 LAB — GLUCOSE, CAPILLARY
Glucose-Capillary: 128 mg/dL — ABNORMAL HIGH (ref 65–99)
Glucose-Capillary: 107 mg/dL — ABNORMAL HIGH (ref 65–99)
Glucose-Capillary: 123 mg/dL — ABNORMAL HIGH (ref 65–99)

## 2015-04-13 LAB — TSH: TSH: 0.02 u[IU]/mL — ABNORMAL LOW (ref 0.350–4.500)

## 2015-04-13 MED ORDER — DEXTROSE-NACL 5-0.9 % IV SOLN
INTRAVENOUS | Status: DC
  Administered 2015-04-13: 01:00:00 via INTRAVENOUS

## 2015-04-13 MED ORDER — HYDROMORPHONE HCL 1 MG/ML IJ SOLN
1.0000 mg | INTRAMUSCULAR | Status: DC | PRN
  Administered 2015-04-13 – 2015-04-15 (×9): 1 mg via INTRAVENOUS
  Filled 2015-04-13 (×11): qty 1

## 2015-04-13 MED ORDER — ACETAMINOPHEN 325 MG PO TABS
650.0000 mg | ORAL_TABLET | Freq: Four times a day (QID) | ORAL | Status: DC | PRN
  Administered 2015-04-13: 650 mg via ORAL
  Filled 2015-04-13: qty 2

## 2015-04-13 MED ORDER — ONDANSETRON HCL 4 MG PO TABS: 4.0000 mg | ORAL_TABLET | Freq: Four times a day (QID) | ORAL | Status: DC | PRN

## 2015-04-13 MED ORDER — ENOXAPARIN SODIUM 40 MG/0.4ML ~~LOC~~ SOLN: 40.0000 mg | SUBCUTANEOUS | Status: DC

## 2015-04-13 MED ORDER — ASPIRIN EC 81 MG PO TBEC
81.0000 mg | DELAYED_RELEASE_TABLET | Freq: Every day | ORAL | Status: DC
  Administered 2015-04-13: 81 mg via ORAL
  Filled 2015-04-13: qty 1

## 2015-04-13 MED ORDER — CHLORHEXIDINE GLUCONATE 0.12 % MT SOLN
15.0000 mL | Freq: Two times a day (BID) | OROMUCOSAL | Status: DC
  Administered 2015-04-14: 15 mL via OROMUCOSAL
  Filled 2015-04-13: qty 15

## 2015-04-13 MED ORDER — CETYLPYRIDINIUM CHLORIDE 0.05 % MT LIQD
7.0000 mL | Freq: Two times a day (BID) | OROMUCOSAL | Status: DC
  Administered 2015-04-14: 7 mL via OROMUCOSAL

## 2015-04-13 MED ORDER — ONDANSETRON HCL 4 MG/2ML IJ SOLN
4.0000 mg | Freq: Four times a day (QID) | INTRAMUSCULAR | Status: DC | PRN
  Administered 2015-04-13 – 2015-04-14 (×4): 4 mg via INTRAVENOUS
  Filled 2015-04-13 (×4): qty 2

## 2015-04-13 MED ORDER — KCL IN DEXTROSE-NACL 20-5-0.9 MEQ/L-%-% IV SOLN
INTRAVENOUS | Status: DC
  Administered 2015-04-13: 23:00:00 via INTRAVENOUS
  Administered 2015-04-13: 1 mL via INTRAVENOUS
  Administered 2015-04-14: 13:00:00 via INTRAVENOUS

## 2015-04-13 NOTE — Progress Notes (Signed)
Vomited small amount  yellow liquid.  Zofran given.  Will continue to monitor.

## 2015-04-13 NOTE — Consult Note (Signed)
Referring Provider: Kalman Jewels, MD  Primary Care Physician:  Purvis Kilts, MD Primary Gastroenterologist:  Dr. Laural Golden  Reason for Consultation:    Abdominal pain nausea and vomiting. Cholelithiasis and dilated biliary system.  HPI:   Patient is 75 year old Caucasian female who has been experiencing upper and lower back pain for 2 months. Patient came to emergency room on 04/07/2015 for worsening pain not relieved with OTC medications. Lumbar spine CT revealed lumbar spondylosis with degenerative disc disease and evidence of T12 vertebral kyphoplasty. She also had CT angiogram chest abdomen and pelvis revealing T6 compression fracture felt to be subacute, dilated gallbladder with stones and dilated biliary system without evidence of choledocholithiasis. He should return a day later for abdominal ultrasound confirmed CT finding but failed to reveal choledocholithiasis. Yesterday afternoon she developed mid and upper abdominal pain associated with nausea and vomiting. She also had chills but no fever. Patient reported emergency room. She had another abdominopelvic CT which revealed progressive dilation to bile duct. Bile duct measured 11 mm on study of 04/07/2015 and now 14 mm. Patient was hospitalized for further management. Patient states she has not had abdominal pain nausea or vomiting prior to yesterday. She states she has had good appetite and has not lost any weight. She denies diarrhea constipation melena or rectal bleeding. She has been taking Aleve OTC on as-needed assist for back pain. Regarding T6 compression fracture she was seen by  Dr. Ellene Route who recommended kyphoplasty which has not been scheduled yet. Patient patient states she has been taking here for husband who has metastatic lung disease. She has been under a lot of stress and not getting much rest.  She has 2 daughters. One of her daughter works at [employer]. She worked in [employer] fund[employer] for more than 15 years but  retired from [employer] where she worked for 15 years. She smokes cigarettes more than 45 years but less than half a pack per day. She does not drink alcohol. She has sister age 81 in good health. Brother died of throat cancer at 64. One sister died of breast cancer at age 22. Other sister died of cirrhosis at age 60. Etiology unknown     Past Medical History  Diagnosis Date  . Compression fracture 07/24/09    T12; kyphoplasty  . Atrial fibrillation ([place]) 2011    Postop, spontaneous conversion to normal sinus after one hour  . Tobacco abuse   . History of echocardiogram 5/11    EF 65%    Past Surgical History  Procedure Laterality Date   .  Back surgery 2. She believes first surgery was in 1999 and the second one in 2000.   Hemorrhoidectomy .  Surgery on left wrist for fracture about 9 years ago.            Prior to Admission medications   Medication Sig Start Date End Date Taking? Authorizing Provider  aspirin EC 81 MG tablet Take 81 mg by mouth daily.   Yes Historical Provider, MD  Naproxen Sodium (ALEVE) 220 MG CAPS Take 220 mg by mouth daily as needed (for pain).    Yes Historical Provider, MD  oxyCODONE-acetaminophen (PERCOCET/ROXICET) 5-325 MG tablet Take 1 tablet by mouth every 6 (six) hours as needed. Patient taking differently: Take 1 tablet by mouth every 6 (six) hours as needed for moderate pain or severe pain.  04/07/15  Yes Lily Kocher, PA-C    Current Facility-Administered Medications  Medication Dose Route Frequency Provider Last Rate Last Dose  .  acetaminophen (TYLENOL) tablet 650 mg  650 mg Oral Q6H PRN Orvan Falconer, MD   650 mg at 04/13/15 0711  . dextrose 5 % and 0.9 % NaCl with KCl 20 mEq/L infusion   Intravenous Continuous Rogene Houston, MD      . HYDROmorphone (DILAUDID) injection 1 mg  1 mg Intravenous Q2H PRN Orvan Falconer, MD   1 mg at 04/13/15 0655  . ondansetron (ZOFRAN) tablet 4 mg  4 mg Oral Q6H PRN Orvan Falconer, MD       Or  . ondansetron [facility])  injection 4 mg  4 mg Intravenous Q6H PRN Orvan Falconer, MD   4 mg at 04/13/15 0430    Allergies as of 04/12/2015  . (No Known Allergies)    Family History  Problem Relation Age of Onset  . Heart attack Father   . Stroke Father   . Breast cancer Sister     Social History   Social History  . Marital Status: Married    Spouse Name: N/A  . Number of Children: 2  . Years of Education: N/A   Occupational History  . Not on file.   Social History Main Topics  . Smoking status: Former Smoker -- 0.15 packs/day for 20 years  . Smokeless tobacco: Not on file  . Alcohol Use: No  . Drug Use: No  . Sexual Activity: Not Currently   Other Topics Concern  . Not on file   Social History Narrative   Active in gardens and does yard work.    Review of Systems: See HPI, otherwise normal ROS  Physical Exam: Temp:  [97.5 F (36.4 C)-98.6 F (37 C)] 98.6 F (37 C) (02/05 0501) Pulse Rate:  [102-124] 124 (02/05 0501) Resp:  [20-22] 20 (02/05 0501) BP: (110-151)/(61-92) 151/61 mmHg (02/05 0501) SpO2:  [89 %-99 %] 89 % (02/05 0501) Weight:  [118 lb 11.2 oz (53.842 kg)-122 lb (55.339 kg)] 118 lb 11.2 oz (53.842 kg) (02/05 0034) Last BM Date: 04/12/15 Patient is alert and appears to be in some distress from pain. She has bilateral exophthalmos. Conjunctivas pink. Sclera is nonicteric.  oropharyngeal mucosa is dry. She has upper dental plate. She has her own teeth lower jaw. No neck masses or thyromegaly noted. Cardiac exam with regular rhythm normal S1 and S2. Faint systolic ejection murmur noted at left sternal border. Lungs are clear to auscultation. Abdomen is symmetrical. Bowel sounds are normal. On palpation abdomen is soft with mild peri-umbilicus tenderness along with moderate tenderness in epigastrium and under the right rib cage. No organomegaly or masses. No peripheral edema or clubbing noted.  Lab Results:  Recent Labs  04/12/15 2023  WBC 6.7  HGB 10.6*  HCT 33.1*  PLT  238   BMET  Recent Labs  04/12/15 2023  NA 140  K 3.4*  CL 99*  CO2 31  GLUCOSE 121*  BUN 15  CREATININE 0.38*  CALCIUM 9.5   LFT  Recent Labs  04/12/15 2023  PROT 7.3  ALBUMIN 3.8  AST 44*  ALT 35  ALKPHOS 210*  BILITOT 0.8   PT/INR No results for input(s): LABPROT, INR in the last 72 hours. Hepatitis Panel No results for input(s): HEPBSAG, HCVAB, HEPAIGM, HEPBIGM in the last 72 hours.  Studies/Results: Ct Abdomen Pelvis W Contrast  04/12/2015  CLINICAL DATA:  Nausea, vomiting and mid abdominal pain for 1 day. History of spinal fractures, atrial fibrillation. EXAM: CT ABDOMEN AND PELVIS WITH CONTRAST TECHNIQUE: Multidetector CT imaging of the  abdomen and pelvis was performed using the standard protocol following bolus administration of intravenous contrast. CONTRAST:  172mL OMNIPAQUE IOHEXOL 300 MG/ML  SOLN COMPARISON:  RIGHT upper quadrant ultrasound April 08, 2015 and CT thoracolumbar spine April 07, 2015. FINDINGS: LUNG BASES: Included view of the lung bases are clear. Visualized heart and pericardium are unremarkable. SOLID ORGANS: The gallbladder is very distended, with subcentimeter gallstone. No gallbladder wall thickening or pericholecystic fluid. Moderate intrahepatic biliary dilatation to the level of the mid Common bile duct without CT findings of choledocholithiasis. Common bile duct was 11 mm, now 14 mm. No pancreatic head mass. No significant pancreatic ductal dilatation. Spleen, adrenal glands are normal. GASTROINTESTINAL TRACT: The stomach, small and large bowel are normal in course and caliber without inflammatory changes. Mild descending colon and sigmoid diverticulosis. Moderate amount of retained ascending colonic stool. Normal appendix. KIDNEYS/ URINARY TRACT: Kidneys are orthotopic, demonstrating symmetric enhancement. No nephrolithiasis, hydronephrosis or solid renal masses. 12 mm simple cyst upper pole RIGHT kidney. Too small to characterize  hypodensities in the kidneys bilaterally. 3.2 cm LEFT lower pole cyst. The unopacified ureters are normal in course and caliber. Delayed imaging through the kidneys demonstrates symmetric prompt contrast excretion within the proximal urinary collecting system. Urinary bladder is well distended and unremarkable. PERITONEUM/RETROPERITONEUM: Aortoiliac vessels are normal in course and caliber, mild calcific atherosclerosis. No lymphadenopathy by CT size criteria. Internal reproductive organs are unremarkable. No intraperitoneal free fluid nor free air. SOFT TISSUE/OSSEOUS STRUCTURES: Patient is osteopenic. New moderate L4 burst fracture with 30-50% height loss, 4 mm retro post bony fragments. Severe L5-S1 degenerative disc. Status post T12 vertebral body cement augmentation without progressed height loss. Punctate calcification RIGHT breast. IMPRESSION: New moderate L4 burst fracture, 4 mm retropulsed bony fragments without malalignment. Status post T12 vertebral body cement augmentation with stable height loss. Gallbladder distention and cholelithiasis without CT findings of acute cholecystitis. Worsening intra and extrahepatic biliary dilatation to level of mid Common bile duct with there is apparent stenosis/stricture though, obstructing lesion not excluded. Electronically Signed   By: Elon Alas M.D.   On: 04/12/2015 22:57    Assessment;  Patient is 75 year old Caucasian female who presents with abdominal pain nausea and vomiting and noted to have progressive dilation of intrahepatic extrahepatic biliary system and she also has distended gallbladder with cholelithiasis. Ultrasound and CT negative for choledocholithiasis. Distal CBD not well-seen and is concerning for neoplastic process. She could also have impacted stone resulting in high-grade but incomplete biliary obstruction. I do not believe MRCP will be helpful in her management at this point. She will benefit from diagnostic and therapeutic  ERCP. I have reviewed the procedure and risks with the patient and her daughter Vivien Rota who is at bedside and they are both agreeable.  Physical examination is pertinent for exophthalmos.she had normal TSH in May 2011. Will obtain TSH with next blood draw.   Recommendations;  Hold aspirin and Enoxaparin. Mechanical DVT prophylaxis. Metabolic 7, LFTs and INR in a.m. TSH.  ERCP with stone extraction and or biliary stenting on 04/14/2015. Cipro 400 mg IV 1 to 2 hours prior to procedure.   LOS: 1 day   REHMAN,NAJEEB U  04/13/2015, 1:00 PM

## 2015-04-13 NOTE — Progress Notes (Signed)
[facility] PROGRESS NOTE  Courtney Arnold [id number] DOB: [birth year] DOA: 04/12/2015 PCP: Purvis Kilts, MD  Assessment/Plan: 1. Cholelithiasis vs choledocholithiasis: GI consulted- appreciate their recommendations; no abdominal tenderness today on exam. Pain control with dilaudid, ERCP tomorrow, clear liquid diet until midnight 2. Compression fracture: New moderate L4 burst fracture, 4 mm retropulsed bony fragments without malalignment. Status post T12 vertebral body cement augmentation with stable height loss, no signs of cord compromise, FROM lower extremities and no loss of bowel or bladder function, can follow up oupatient- patient states she saw neurosurgery last week and she will need another back surgery 3. DVT prophylaxis: Lovenox previously but will be SCDs for now in anticipation with ERCP to be done tomorrow  Code Status: full code Family Communication: no family bedside Disposition Plan: home    Consultants:  Gastroenterology  Procedures:  none  Antibiotics:  none  HPI/Subjective: Courtney Arnold is an 75 y.o. female with a hx a compression fx at T6 presents with abdominal pain that began 1 week ago and has been worsening. Her pain is located in her diffuse abdomen and does not radiate. She admits to associated nausea and vomiting. She is unaware of any alleviating factors and has not taken anything for her symptoms PTA. She denies any diarrhea, CP, SOB, fever, chills, hematemesis, or melena. Patient states that was diagnosed with a T6 compression fracture months ago and is supposed to be scheduling surgery. While in the ED, labs revealed a unremarkable BMP, alkaline phosphatase 210, negative troponin and lactic acid, WBC of 6.7 and hgb of 10.6. Abdominal CT shows gallbladder distention and cholelithiasis without CT findings of acute cholecystitis. Worsening intra and extrahepatic biliary dilatation to level of mid common bile duct with there is apparent  stenosis/stricture though, obstructing lesion not excluded. Patient reports that she is very hungry and is wondering when she will be able to eat.  She is upset about the prospect of having another surgery and would like to avoid cholecystectomy as she is supposed to have back surgery.  She states her abdominal pain is resolved but her back pain is still significant.  Objective: Filed Vitals:   04/13/15 0034 04/13/15 0501  BP: 110/69 151/61  Pulse: 102 124  Temp: 97.5 F (36.4 C) 98.6 F (37 C)  Resp:  20   No intake or output data in the 24 hours ending 04/13/15 1239 Filed Weights   04/12/15 1946 04/13/15 0034  Weight: 55.339 kg (122 lb) 53.842 kg (118 lb 11.2 oz)    Exam:   General:  Elderly female, mild distress, crying throughout the exam  Cardiovascular: S1S2 RRR no murmurs, 2+ peripheral pulses bilaterally UE and LE  Respiratory: clear to auscultation bilaterally, good aeration  Abdomen: soft, nontender, nondistended, bowel sounds present  Musculoskeletal: FROM upper and lower extremities bilaterally, unable to palpate back as patient in pain   Data Reviewed: Basic Metabolic Panel:  Recent Labs Lab 04/07/15 2002 04/07/15 2132 04/12/15 2023  NA 140 137 140  K 3.7 3.8 3.4*  CL 101 101 99*  CO2  --  29 31  GLUCOSE 107* 105* 121*  BUN 15 15 15   CREATININE 0.40* 0.40* 0.38*  CALCIUM  --  9.3 9.5   Liver Function Tests:  Recent Labs Lab 04/07/15 2132 04/12/15 2023  AST 18 44*  ALT 16 35  ALKPHOS 118 210*  BILITOT 0.5 0.8  PROT 6.4* 7.3  ALBUMIN 3.4* 3.8    Recent Labs Lab 04/07/15 2132 04/12/15  2023  LIPASE 27 28   No results for input(s): AMMONIA in the last 168 hours. CBC:  Recent Labs Lab 04/07/15 2002 04/07/15 2132 04/12/15 2023  WBC  --  6.8 6.7  NEUTROABS  --  3.5 3.6  HGB 11.2* 10.0* 10.6*  HCT 33.0* 31.3* 33.1*  MCV  --  81.7 82.3  PLT  --  214 238   Cardiac Enzymes:  Recent Labs Lab 04/12/15 2023  TROPONINI <0.03   BNP  (last 3 results) No results for input(s): BNP in the last 8760 hours.  ProBNP (last 3 results) No results for input(s): PROBNP in the last 8760 hours.  CBG:  Recent Labs Lab 04/13/15 0809 04/13/15 1212  GLUCAP 128* 107*    No results found for this or any previous visit (from the past 240 hour(s)).   Studies: Ct Abdomen Pelvis W Contrast  04/12/2015  CLINICAL DATA:  Nausea, vomiting and mid abdominal pain for 1 day. History of spinal fractures, atrial fibrillation. EXAM: CT ABDOMEN AND PELVIS WITH CONTRAST TECHNIQUE: Multidetector CT imaging of the abdomen and pelvis was performed using the standard protocol following bolus administration of intravenous contrast. CONTRAST:  179mL OMNIPAQUE IOHEXOL 300 MG/ML  SOLN COMPARISON:  RIGHT upper quadrant ultrasound April 08, 2015 and CT thoracolumbar spine April 07, 2015. FINDINGS: LUNG BASES: Included view of the lung bases are clear. Visualized heart and pericardium are unremarkable. SOLID ORGANS: The gallbladder is very distended, with subcentimeter gallstone. No gallbladder wall thickening or pericholecystic fluid. Moderate intrahepatic biliary dilatation to the level of the mid Common bile duct without CT findings of choledocholithiasis. Common bile duct was 11 mm, now 14 mm. No pancreatic head mass. No significant pancreatic ductal dilatation. Spleen, adrenal glands are normal. GASTROINTESTINAL TRACT: The stomach, small and large bowel are normal in course and caliber without inflammatory changes. Mild descending colon and sigmoid diverticulosis. Moderate amount of retained ascending colonic stool. Normal appendix. KIDNEYS/ URINARY TRACT: Kidneys are orthotopic, demonstrating symmetric enhancement. No nephrolithiasis, hydronephrosis or solid renal masses. 12 mm simple cyst upper pole RIGHT kidney. Too small to characterize hypodensities in the kidneys bilaterally. 3.2 cm LEFT lower pole cyst. The unopacified ureters are normal in course and  caliber. Delayed imaging through the kidneys demonstrates symmetric prompt contrast excretion within the proximal urinary collecting system. Urinary bladder is well distended and unremarkable. PERITONEUM/RETROPERITONEUM: Aortoiliac vessels are normal in course and caliber, mild calcific atherosclerosis. No lymphadenopathy by CT size criteria. Internal reproductive organs are unremarkable. No intraperitoneal free fluid nor free air. SOFT TISSUE/OSSEOUS STRUCTURES: Patient is osteopenic. New moderate L4 burst fracture with 30-50% height loss, 4 mm retro post bony fragments. Severe L5-S1 degenerative disc. Status post T12 vertebral body cement augmentation without progressed height loss. Punctate calcification RIGHT breast. IMPRESSION: New moderate L4 burst fracture, 4 mm retropulsed bony fragments without malalignment. Status post T12 vertebral body cement augmentation with stable height loss. Gallbladder distention and cholelithiasis without CT findings of acute cholecystitis. Worsening intra and extrahepatic biliary dilatation to level of mid Common bile duct with there is apparent stenosis/stricture though, obstructing lesion not excluded. Electronically Signed   By: Elon Alas M.D.   On: 04/12/2015 22:57    Scheduled Meds: . aspirin EC  81 mg Oral Daily  . enoxaparin (LOVENOX) injection  40 mg Subcutaneous Q24H   Continuous Infusions: . dextrose 5 % and 0.9% NaCl 100 mL/hr at 04/13/15 0103    Active Problems:   TOBACCO ABUSE   Cholelithiasis   Compression fracture  Abdominal pain    Time spent: 35 minutes    [facility] Pager [phone]. If 7PM-7AM, please contact night-coverage at [email or web address], password [facility] 04/13/2015, 12:39 PM  LOS: 1 day

## 2015-04-14 ENCOUNTER — Encounter (HOSPITAL_COMMUNITY): Payer: Self-pay | Admitting: Anesthesiology

## 2015-04-14 ENCOUNTER — Inpatient Hospital Stay (HOSPITAL_COMMUNITY): Payer: Medicare Other | Admitting: Anesthesiology

## 2015-04-14 ENCOUNTER — Encounter (HOSPITAL_COMMUNITY)
Admission: EM | Disposition: A | Discharge status: Home / Self Care | Payer: Self-pay | Source: Home / Self Care | Attending: Internal Medicine

## 2015-04-14 ENCOUNTER — Inpatient Hospital Stay (HOSPITAL_COMMUNITY): Payer: Medicare Other

## 2015-04-14 DIAGNOSIS — D135 Benign neoplasm of extrahepatic bile ducts: Secondary | ICD-10-CM

## 2015-04-14 DIAGNOSIS — E0590 Thyrotoxicosis, unspecified without thyrotoxic crisis or storm: Secondary | ICD-10-CM

## 2015-04-14 HISTORY — PX: ERCP: SHX5425

## 2015-04-14 LAB — BASIC METABOLIC PANEL
Anion gap: 4 — ABNORMAL LOW (ref 5–15)
BUN: 8 mg/dL (ref 6–20)
CO2: 33 mmol/L — ABNORMAL HIGH (ref 22–32)
Calcium: 8.9 mg/dL (ref 8.9–10.3)
Chloride: 102 mmol/L (ref 101–111)
Creatinine, Ser: 0.39 mg/dL — ABNORMAL LOW (ref 0.44–1.00)
GFR calc Af Amer: >60 mL/min (ref >60)
GFR calc non Af Amer: >60 mL/min (ref >60)
Glucose, Bld: 146 mg/dL — ABNORMAL HIGH (ref 65–99)
Potassium: 4.1 mmol/L (ref 3.5–5.1)
Sodium: 139 mmol/L (ref 135–145)

## 2015-04-14 LAB — HEPATIC FUNCTION PANEL
ALT: 29 U/L (ref 14–54)
AST: 34 U/L (ref 15–41)
Albumin: 3.2 g/dL — ABNORMAL LOW (ref 3.5–5.0)
Alkaline Phosphatase: 150 U/L — ABNORMAL HIGH (ref 38–126)
Bilirubin, Direct: 0.1 mg/dL (ref 0.1–0.5)
Indirect Bilirubin: 0.3 mg/dL (ref 0.3–0.9)
Total Bilirubin: 0.4 mg/dL (ref 0.3–1.2)
Total Protein: 6.1 g/dL — ABNORMAL LOW (ref 6.5–8.1)

## 2015-04-14 LAB — GLUCOSE, CAPILLARY
Glucose-Capillary: 134 mg/dL — ABNORMAL HIGH (ref 65–99)
Glucose-Capillary: 114 mg/dL — ABNORMAL HIGH (ref 65–99)

## 2015-04-14 LAB — PROTIME-INR
INR: 1.13 (ref 0.00–1.49)
Prothrombin Time: 14.7 seconds (ref 11.6–15.2)

## 2015-04-14 LAB — MRSA PCR SCREENING: MRSA by PCR: NEGATIVE

## 2015-04-14 SURGERY — ERCP, WITH INTERVENTION IF INDICATED
Anesthesia: General

## 2015-04-14 MED ORDER — ACETAMINOPHEN 325 MG PO TABS
650.0000 mg | ORAL_TABLET | ORAL | Status: DC | PRN
  Administered 2015-04-15: 650 mg via ORAL
  Filled 2015-04-14: qty 2

## 2015-04-14 MED ORDER — LORAZEPAM 1 MG PO TABS
1.0000 mg | ORAL_TABLET | Freq: Three times a day (TID) | ORAL | Status: DC | PRN
  Administered 2015-04-14: 1 mg via ORAL
  Filled 2015-04-14: qty 1

## 2015-04-14 MED ORDER — MIDAZOLAM HCL 2 MG/2ML IJ SOLN
INTRAMUSCULAR | Status: AC
  Filled 2015-04-14: qty 2

## 2015-04-14 MED ORDER — SUCCINYLCHOLINE CHLORIDE 20 MG/ML IJ SOLN
INTRAMUSCULAR | Status: DC | PRN
  Administered 2015-04-14: 120 mg via INTRAVENOUS

## 2015-04-14 MED ORDER — MIDAZOLAM HCL 2 MG/2ML IJ SOLN
1.0000 mg | INTRAMUSCULAR | Status: DC | PRN
  Administered 2015-04-14 (×2): 2 mg via INTRAVENOUS

## 2015-04-14 MED ORDER — IOHEXOL 350 MG/ML SOLN
INTRAVENOUS | Status: DC | PRN
  Administered 2015-04-14: 10:00:00

## 2015-04-14 MED ORDER — STERILE WATER FOR IRRIGATION IR SOLN
Status: DC | PRN
  Administered 2015-04-14: 10:00:00

## 2015-04-14 MED ORDER — IPRATROPIUM-ALBUTEROL 0.5-2.5 (3) MG/3ML IN SOLN
RESPIRATORY_TRACT | Status: AC
  Filled 2015-04-14: qty 3

## 2015-04-14 MED ORDER — FENTANYL CITRATE (PF) 100 MCG/2ML IJ SOLN
INTRAMUSCULAR | Status: AC
  Filled 2015-04-14: qty 2

## 2015-04-14 MED ORDER — METOPROLOL TARTRATE 1 MG/ML IV SOLN
INTRAVENOUS | Status: AC
  Filled 2015-04-14: qty 10

## 2015-04-14 MED ORDER — ONDANSETRON HCL 4 MG/2ML IJ SOLN: 4.0000 mg | Freq: Once | INTRAMUSCULAR | Status: DC | PRN

## 2015-04-14 MED ORDER — FENTANYL CITRATE (PF) 100 MCG/2ML IJ SOLN
INTRAMUSCULAR | Status: DC | PRN
  Administered 2015-04-14: 25 ug via INTRAVENOUS
  Administered 2015-04-14: 100 ug via INTRAVENOUS
  Administered 2015-04-14: 50 ug via INTRAVENOUS
  Administered 2015-04-14: 25 ug via INTRAVENOUS

## 2015-04-14 MED ORDER — PROPOFOL 10 MG/ML IV BOLUS
INTRAVENOUS | Status: AC
  Filled 2015-04-14: qty 20

## 2015-04-14 MED ORDER — METOPROLOL TARTRATE 1 MG/ML IV SOLN
INTRAVENOUS | Status: DC | PRN
  Administered 2015-04-14 (×3): 2.5 mg via INTRAVENOUS

## 2015-04-14 MED ORDER — GLUCAGON HCL RDNA (DIAGNOSTIC) 1 MG IJ SOLR
INTRAMUSCULAR | Status: AC
  Administered 2015-04-14 (×5): 0.25 mg via INTRAVENOUS
  Filled 2015-04-14: qty 1

## 2015-04-14 MED ORDER — METOPROLOL TARTRATE 1 MG/ML IV SOLN
INTRAVENOUS | Status: AC
  Filled 2015-04-14: qty 5

## 2015-04-14 MED ORDER — CIPROFLOXACIN IN D5W 400 MG/200ML IV SOLN
INTRAVENOUS | Status: AC
  Filled 2015-04-14: qty 200

## 2015-04-14 MED ORDER — METOPROLOL TARTRATE 1 MG/ML IV SOLN
2.5000 mg | Freq: Once | INTRAVENOUS | Status: AC
  Administered 2015-04-14: 2.5 mg via INTRAVENOUS

## 2015-04-14 MED ORDER — SODIUM CHLORIDE 0.9 % IV SOLN
INTRAVENOUS | Status: AC
  Filled 2015-04-14: qty 50

## 2015-04-14 MED ORDER — CIPROFLOXACIN IN D5W 400 MG/200ML IV SOLN
400.0000 mg | Freq: Once | INTRAVENOUS | Status: AC
  Administered 2015-04-14: 400 mg via INTRAVENOUS

## 2015-04-14 MED ORDER — LACTATED RINGERS IV SOLN
INTRAVENOUS | Status: DC
  Administered 2015-04-14: 1000 mL via INTRAVENOUS

## 2015-04-14 MED ORDER — FENTANYL CITRATE (PF) 100 MCG/2ML IJ SOLN: 25.0000 ug | INTRAMUSCULAR | Status: DC | PRN

## 2015-04-14 MED ORDER — SUCCINYLCHOLINE CHLORIDE 20 MG/ML IJ SOLN
INTRAMUSCULAR | Status: AC
  Filled 2015-04-14: qty 1

## 2015-04-14 MED ORDER — PHENYLEPHRINE 40 MCG/ML (10ML) SYRINGE FOR IV PUSH (FOR BLOOD PRESSURE SUPPORT)
PREFILLED_SYRINGE | INTRAVENOUS | Status: AC
  Filled 2015-04-14: qty 10

## 2015-04-14 MED ORDER — ROCURONIUM BROMIDE 50 MG/5ML IV SOLN
INTRAVENOUS | Status: AC
  Filled 2015-04-14: qty 1

## 2015-04-14 MED ORDER — IPRATROPIUM-ALBUTEROL 0.5-2.5 (3) MG/3ML IN SOLN
3.0000 mL | Freq: Once | RESPIRATORY_TRACT | Status: AC
  Administered 2015-04-14: 3 mL via RESPIRATORY_TRACT

## 2015-04-14 SURGICAL SUPPLY — 25 items
BAG HAMPER (MISCELLANEOUS) ×3 IMPLANT
BALLN RETRIEVAL 12X15 (BALLOONS) IMPLANT
BALLN RETRIEVAL 12X15MM (BALLOONS)
BALLN RETRIEVAL 9-12 (BALLOONS) ×3 IMPLANT
BASKET TRAPEZOID 3X6 (MISCELLANEOUS) IMPLANT
DEVICE INFLATION ENCORE 26 (MISCELLANEOUS) IMPLANT
DEVICE LOCKING W-BIOPSY CAP (MISCELLANEOUS) ×3 IMPLANT
GUIDEWIRE HYDRA JAGWIRE .35 (WIRE) IMPLANT
GUIDEWIRE JAG HINI 025X260CM (WIRE) IMPLANT
KIT ENDO PROCEDURE PEN (KITS) ×3 IMPLANT
KIT ROOM TURNOVER APOR (KITS) ×3 IMPLANT
LUBRICANT JELLY 4.5OZ STERILE (MISCELLANEOUS) IMPLANT
PAD ARMBOARD 7.5X6 YLW CONV (MISCELLANEOUS) ×3 IMPLANT
PATHFINDER 450CM 0.18 (STENTS) IMPLANT
POSITIONER HEAD 8X9X4 ADT (SOFTGOODS) IMPLANT
SCOPE SPY DS DISPOSABLE (MISCELLANEOUS) IMPLANT
SNARE ROTATE MED OVAL 20MM (MISCELLANEOUS) IMPLANT
SNARE SHORT THROW 13M SML OVAL (MISCELLANEOUS) IMPLANT
SPHINCTEROTOME AUTOTOME .25 (MISCELLANEOUS) ×3 IMPLANT
SPHINCTEROTOME HYDRATOME 44 (MISCELLANEOUS) ×3 IMPLANT
SYSTEM CONTINUOUS INJECTION (MISCELLANEOUS) ×3 IMPLANT
TUBING INSUFFLATOR CO2MPACT (TUBING) ×3 IMPLANT
WALLSTENT METAL COVERED 10X60 (STENTS) IMPLANT
WALLSTENT METAL COVERED 10X80 (STENTS) IMPLANT
WATER STERILE IRR 1000ML POUR (IV SOLUTION) ×3 IMPLANT

## 2015-04-14 NOTE — Op Note (Signed)
ERCP PROCEDURE REPORT  PATIENT:  Courtney Arnold  MR#:  [id number] Birthdate:  27-Feb-1941, 75 y.o., female Endoscopist:  Dr. Rogene Houston, MD Referred By:  Dr. Kalman Jewels, MD Procedure Date: 04/14/2015  Procedure:   ERCP with biliary sphincterotomy and stenting.    Indications:  Patient is 75 year old Caucasian female who presents with abdominal pain nausea vomiting and mildly elevated AST and alkaline phosphatase. Imaging studies revealed distended gallbladder with gallstones and dilated biliary system but no choledocholithiasis on CT as well as ultrasound. Ration is undergoing diagnostic and therapeutic ERCP.            Informed Consent:  The risks, benefits, limitations, alternatives, and mponderable have been reviewed with the patient. I specifically discussed a 1 in 10 chance of pancreatitis, reaction to medications, bleeding, perforation and the possibility of a failed ERCP. Potential for sphincterotomy and stent placement also reviewed. Questions have been answered. All parties agreeable.  Please see history & physical in medical record for more information.  Medications:  Gen. endotracheal anesthesia. Please see anesthesia record for complete details  Description of procedure:  Procedure performed in the OR. The patient was placed under anesthesia, intubated, and turned into semipermanent position. Therapeutic Pentax video duodenoscope passed through the oropharynx without any difficulty into the esophagus, stomach, and across the pylorus and pull, and descending duodenum.  Rx 44 autotome an 035 hydra jag wire used for cannulation.   Findings:  Small periampullary polyp. Normal appearing ampulla of Vater with tiny ampullary orifice. Dilated CBD and CHD with maximal diameter of 17 mm. Dilated intrahepatic biliary radicles. No filling defect noted in CBD or CHD with distal segment of CBD not well seen because of dense contrast in colon. Biliary sphincterotomy performed and balloon  stone extractor passed through the duct no stones noted. 10 French 7 cm long plastic stent placed for biliary decompression using one-step Microvasive system. Pancreatic duct was not cannulated or filled with contrast.   Therapeutic/Diagnostic Maneuvers Performed:   See above. Periampullary polyp was ablated via cold biopsy.  Complications:  None  EBL: None  Impression:  Markedly dilated CBD and CHD with dilated intrahepatic biliary radicles but no evidence of choledocholithiasis. Distal end of CBD not well studied because of dense contrast in the colon.  Very small ampullary orifice suggestive of papillary stenosis but distal CBD stricture could be ruled out. Biliary sphincterotomy performed. 10 French 7 cm long plastic stent placed for biliary decompression.  Recommendations:  No aspirin or anticoagulants for 72 hours. Clear liquids today. CBC, comprehensive chemistry panel and serum amylase in a.m. Surgical consultation either while she is here or on an outpatient basis. Repeat ERCP in 4-6 weeks.  REHMAN,NAJEEB U  04/14/2015  11:11 AM  CC: Dr. Hilma Favors, Betsy Coder, MD & Dr. Rayne Du ref. provider found

## 2015-04-14 NOTE — Anesthesia Postprocedure Evaluation (Signed)
Anesthesia Post Note  Patient: SKYLA SPEEGLE  Procedure(s) Performed: Procedure(s) (LRB): ENDOSCOPIC RETROGRADE CHOLANGIOPANCREATOGRAPHY (ERCP) Biliary Sphincterotomy, 10x7 stent placement Dilated bilary system just not well seen (N/A)  Patient location during evaluation: PACU Anesthesia Type: General Level of consciousness: awake Pain management: pain level controlled Vital Signs Assessment: post-procedure vital signs reviewed and stable Respiratory status: spontaneous breathing Cardiovascular status: blood pressure returned to baseline Postop Assessment: no signs of nausea or vomiting Anesthetic complications: no    Last Vitals:  Filed Vitals:   04/14/15 1200 04/14/15 1225  BP: 112/47 106/47  Pulse: 88   Temp:  36.7 C  Resp: 23     Last Pain:  Filed Vitals:   04/14/15 1232  PainSc: 4                  Steadman Prosperi

## 2015-04-14 NOTE — Anesthesia Procedure Notes (Signed)
Procedure Name: Intubation Date/Time: 04/14/2015 10:00 AM Performed by: Andree Elk, Jody Silas A Pre-anesthesia Checklist: Patient identified, Patient being monitored, Timeout performed, Emergency Drugs available and Suction available Patient Re-evaluated:Patient Re-evaluated prior to inductionOxygen Delivery Method: Circle System Utilized Preoxygenation: Pre-oxygenation with 100% oxygen Intubation Type: IV induction Ventilation: Mask ventilation without difficulty Laryngoscope Size: 3 and Miller Grade View: Grade I Tube type: Oral Tube size: 7.0 mm Number of attempts: 1 Airway Equipment and Method: Stylet Placement Confirmation: ETT inserted through vocal cords under direct vision,  positive ETCO2 and breath sounds checked- equal and bilateral Secured at: 21 cm Tube secured with: Tape Dental Injury: Teeth and Oropharynx as per pre-operative assessment

## 2015-04-14 NOTE — Anesthesia Preprocedure Evaluation (Addendum)
Anesthesia Evaluation  Patient identified by MRN, date of birth, ID band Patient awake    Reviewed: Allergy & Precautions, NPO status , Patient's Chart, lab work & pertinent test results  Airway Mallampati: II  TM Distance: >3 FB     Dental  (+) Edentulous Upper, Edentulous Lower   Pulmonary former smoker,    Pulmonary exam normal        Cardiovascular Normal cardiovascular exam+ dysrhythmias Atrial Fibrillation      Neuro/Psych Anxiety    GI/Hepatic   Endo/Other  Hyperthyroidism   Renal/GU      Musculoskeletal   Abdominal Normal abdominal exam  (+)   Peds  Hematology   Anesthesia Other Findings   Reproductive/Obstetrics                           Anesthesia Physical Anesthesia Plan  ASA: III  Anesthesia Plan: General   Post-op Pain Management:    Induction: Intravenous  Airway Management Planned: Oral ETT  Additional Equipment:   Intra-op Plan:   Post-operative Plan:   Informed Consent: I have reviewed the patients History and Physical, chart, labs and discussed the procedure including the risks, benefits and alternatives for the proposed anesthesia with the patient or authorized representative who has indicated his/her understanding and acceptance.   Dental advisory given  Plan Discussed with: CRNA  Anesthesia Plan Comments:         Anesthesia Quick Evaluation

## 2015-04-14 NOTE — Progress Notes (Signed)
[facility] PROGRESS NOTE  Courtney Arnold [id number] DOB: 08/22/1940 DOA: 04/12/2015 PCP: Purvis Kilts, MD  Assessment/Plan: 1. Cholelithiasis: GI consulted- appreciate their recommendations, stent placed today during ERCP; no abdominal tenderness today on exam. Pain control with dilaudid, clear liquid diet 2. Compression fracture: New moderate L4 burst fracture, 4 mm retropulsed bony fragments without malalignment. Status post T12 vertebral body cement augmentation with stable height loss, no signs of cord compromise, FROM lower extremities and no loss of bowel or bladder function, can follow up oupatient- patient states she saw neurosurgery last week and she will need another back surgery 3. Exophthalmos: TSH low, T3 and T4 pending, thyroid ultrasound ordered 4. Kyphoplasty: patient stating she is supposed to be getting a procedure done tomorrow but there is not record of this, per patients daughter Lynelle Smoke patients procedure can be either tomorrow or Friday, she state she will call and keep patient on schedule for Friday 5. DVT prophylaxis: Lovenox previously but will be SCDs  Code Status: full code Family Communication: no family bedside Disposition Plan: home    Consultants:  Gastroenterology  Procedures:  none  Antibiotics:  none  HPI/Subjective: Courtney Arnold is an 75 y.o. female with a hx a compression fx at T6 presents with abdominal pain that began 1 week ago and has been worsening. Her pain is located in her diffuse abdomen and does not radiate. She admits to associated nausea and vomiting. She is unaware of any alleviating factors and has not taken anything for her symptoms PTA. She denies any diarrhea, CP, SOB, fever, chills, hematemesis, or melena. Patient states that was diagnosed with a T6 compression fracture months ago and is supposed to be scheduling surgery. While in the ED, labs revealed a unremarkable BMP, alkaline phosphatase 210, negative  troponin and lactic acid, WBC of 6.7 and hgb of 10.6. Abdominal CT shows gallbladder distention and cholelithiasis without CT findings of acute cholecystitis. Worsening intra and extrahepatic biliary dilatation to level of mid common bile duct with there is apparent stenosis/stricture though, obstructing lesion not excluded. ERCP performed on 04/14/15.  While no clear filling defect was appreciated study quality was skewed by contrast in colon.  Biliary sphincterotomy and stent placement were performed.  Patient appears drowsy from sedation from procedure.  Daughter is bedside and does not have any questions at this time.   Objective: Filed Vitals:   04/14/15 1200 04/14/15 1225  BP: 112/47 106/47  Pulse: 88   Temp:  98 F (36.7 C)  Resp: 23     Intake/Output Summary (Last 24 hours) at 04/14/15 1417 Last data filed at 04/14/15 1111  Gross per 24 hour  Intake    700 ml  Output      0 ml  Net    700 ml   Filed Weights   04/12/15 1946 04/13/15 0034  Weight: 55.339 kg (122 lb) 53.842 kg (118 lb 11.2 oz)    Exam:   General:  Elderly female, agitated but drowsy  Cardiovascular: S1S2 RRR no murmurs, 2+ peripheral pulses bilaterally UE and LE  Respiratory: clear to auscultation bilaterally, good aeration  Abdomen: soft, nontender, nondistended, bowel sounds present  Musculoskeletal: FROM upper and lower extremities bilaterally patient moving about the bed  Data Reviewed: Basic Metabolic Panel:  Recent Labs Lab 04/07/15 2002 04/07/15 2132 04/12/15 2023 04/14/15 0615  NA 140 137 140 139  K 3.7 3.8 3.4* 4.1  CL 101 101 99* 102  CO2  --  29 31 33*  GLUCOSE 107* 105* 121* 146*  BUN 15 15 15 8   CREATININE 0.40* 0.40* 0.38* 0.39*  CALCIUM  --  9.3 9.5 8.9   Liver Function Tests:  Recent Labs Lab 04/07/15 2132 04/12/15 2023 04/14/15 0615  AST 18 44* 34  ALT 16 35 29  ALKPHOS 118 210* 150*  BILITOT 0.5 0.8 0.4  PROT 6.4* 7.3 6.1*  ALBUMIN 3.4* 3.8 3.2*    Recent  Labs Lab 04/07/15 2132 04/12/15 2023  LIPASE 27 28   No results for input(s): AMMONIA in the last 168 hours. CBC:  Recent Labs Lab 04/07/15 2002 04/07/15 2132 04/12/15 2023  WBC  --  6.8 6.7  NEUTROABS  --  3.5 3.6  HGB 11.2* 10.0* 10.6*  HCT 33.0* 31.3* 33.1*  MCV  --  81.7 82.3  PLT  --  214 238   Cardiac Enzymes:  Recent Labs Lab 04/12/15 2023  TROPONINI <0.03   BNP (last 3 results) No results for input(s): BNP in the last 8760 hours.  ProBNP (last 3 results) No results for input(s): PROBNP in the last 8760 hours.  CBG:  Recent Labs Lab 04/13/15 0809 04/13/15 1212 04/13/15 1643 04/14/15 0801  GLUCAP 128* 107* 123* 134*    Recent Results (from the past 240 hour(s))  MRSA PCR Screening     Status: None   Collection Time: 04/14/15  3:00 AM  Result Value Ref Range Status   MRSA by PCR NEGATIVE NEGATIVE Final    Comment:        The GeneXpert MRSA Assay (FDA approved for NASAL specimens only), is one component of a comprehensive MRSA colonization surveillance program. It is not intended to diagnose MRSA infection nor to guide or monitor treatment for MRSA infections.      Studies: Ct Abdomen Pelvis W Contrast  04/12/2015  CLINICAL DATA:  Nausea, vomiting and mid abdominal pain for 1 day. History of spinal fractures, atrial fibrillation. EXAM: CT ABDOMEN AND PELVIS WITH CONTRAST TECHNIQUE: Multidetector CT imaging of the abdomen and pelvis was performed using the standard protocol following bolus administration of intravenous contrast. CONTRAST:  1110mL OMNIPAQUE IOHEXOL 300 MG/ML  SOLN COMPARISON:  RIGHT upper quadrant ultrasound April 08, 2015 and CT thoracolumbar spine April 07, 2015. FINDINGS: LUNG BASES: Included view of the lung bases are clear. Visualized heart and pericardium are unremarkable. SOLID ORGANS: The gallbladder is very distended, with subcentimeter gallstone. No gallbladder wall thickening or pericholecystic fluid. Moderate  intrahepatic biliary dilatation to the level of the mid Common bile duct without CT findings of choledocholithiasis. Common bile duct was 11 mm, now 14 mm. No pancreatic head mass. No significant pancreatic ductal dilatation. Spleen, adrenal glands are normal. GASTROINTESTINAL TRACT: The stomach, small and large bowel are normal in course and caliber without inflammatory changes. Mild descending colon and sigmoid diverticulosis. Moderate amount of retained ascending colonic stool. Normal appendix. KIDNEYS/ URINARY TRACT: Kidneys are orthotopic, demonstrating symmetric enhancement. No nephrolithiasis, hydronephrosis or solid renal masses. 12 mm simple cyst upper pole RIGHT kidney. Too small to characterize hypodensities in the kidneys bilaterally. 3.2 cm LEFT lower pole cyst. The unopacified ureters are normal in course and caliber. Delayed imaging through the kidneys demonstrates symmetric prompt contrast excretion within the proximal urinary collecting system. Urinary bladder is well distended and unremarkable. PERITONEUM/RETROPERITONEUM: Aortoiliac vessels are normal in course and caliber, mild calcific atherosclerosis. No lymphadenopathy by CT size criteria. Internal reproductive organs are unremarkable. No intraperitoneal free fluid nor free air. SOFT TISSUE/OSSEOUS STRUCTURES: Patient is osteopenic.  New moderate L4 burst fracture with 30-50% height loss, 4 mm retro post bony fragments. Severe L5-S1 degenerative disc. Status post T12 vertebral body cement augmentation without progressed height loss. Punctate calcification RIGHT breast. IMPRESSION: New moderate L4 burst fracture, 4 mm retropulsed bony fragments without malalignment. Status post T12 vertebral body cement augmentation with stable height loss. Gallbladder distention and cholelithiasis without CT findings of acute cholecystitis. Worsening intra and extrahepatic biliary dilatation to level of mid Common bile duct with there is apparent  stenosis/stricture though, obstructing lesion not excluded. Electronically Signed   By: Elon Alas M.D.   On: 04/12/2015 22:57    Scheduled Meds: . antiseptic oral rinse  7 mL Mouth Rinse q12n4p  . chlorhexidine  15 mL Mouth Rinse BID   Continuous Infusions: . dextrose 5 % and 0.9 % NaCl with KCl 20 mEq/L 100 mL/hr at 04/14/15 1259    Principal Problem:   Cholelithiasis Active Problems:   TOBACCO ABUSE   Compression fracture   Abdominal pain   AP (abdominal pain)   Common bile duct dilation    Time spent: 35 minutes    [facility] Pager [phone]. If 7PM-7AM, please contact night-coverage at [email or web address], password [facility] 04/14/2015, 2:17 PM  LOS: 2 days

## 2015-04-14 NOTE — Progress Notes (Signed)
Lab studies reviewed. AST is now normal at 34. AP is down from 210 to 150. INR is normal. TSH is low at 0.020. Will check T4 FTI and T3. Will proceed with ERCP.

## 2015-04-14 NOTE — Transfer of Care (Signed)
Immediate Anesthesia Transfer of Care Note  Patient: Courtney Arnold  Procedure(s) Performed: Procedure(s): ENDOSCOPIC RETROGRADE CHOLANGIOPANCREATOGRAPHY (ERCP) Biliary Sphincterotomy, 10x7 stent placement Dilated bilary system just not well seen (N/A)  Patient Location: PACU  Anesthesia Type:General  Level of Consciousness: awake and confused  Airway & Oxygen Therapy: Patient Spontanous Breathing and Patient connected to face mask oxygen  Post-op Assessment: Report given to RN  Post vital signs: Reviewed and stable  Last Vitals:  Filed Vitals:   04/14/15 0940 04/14/15 0945  BP: 97/47 109/43  Pulse:    Temp:    Resp: 23 21    Complications: No apparent anesthesia complications

## 2015-04-15 ENCOUNTER — Encounter (HOSPITAL_COMMUNITY): Payer: Self-pay | Admitting: Internal Medicine

## 2015-04-15 ENCOUNTER — Encounter (INDEPENDENT_AMBULATORY_CARE_PROVIDER_SITE_OTHER): Payer: Self-pay | Admitting: Internal Medicine

## 2015-04-15 LAB — COMPREHENSIVE METABOLIC PANEL
ALT: 47 U/L (ref 14–54)
AST: 57 U/L — ABNORMAL HIGH (ref 15–41)
Albumin: 3.1 g/dL — ABNORMAL LOW (ref 3.5–5.0)
Alkaline Phosphatase: 185 U/L — ABNORMAL HIGH (ref 38–126)
Anion gap: 6 (ref 5–15)
BUN: 5 mg/dL — ABNORMAL LOW (ref 6–20)
CO2: 31 mmol/L (ref 22–32)
Calcium: 8.8 mg/dL — ABNORMAL LOW (ref 8.9–10.3)
Chloride: 101 mmol/L (ref 101–111)
Creatinine, Ser: 0.34 mg/dL — ABNORMAL LOW (ref 0.44–1.00)
GFR calc Af Amer: >60 mL/min (ref >60)
GFR calc non Af Amer: >60 mL/min (ref >60)
Glucose, Bld: 140 mg/dL — ABNORMAL HIGH (ref 65–99)
Potassium: 3.6 mmol/L (ref 3.5–5.1)
Sodium: 138 mmol/L (ref 135–145)
Total Bilirubin: 0.7 mg/dL (ref 0.3–1.2)
Total Protein: 6 g/dL — ABNORMAL LOW (ref 6.5–8.1)

## 2015-04-15 LAB — CBC
HCT: 30.3 % — ABNORMAL LOW (ref 36.0–46.0)
Hemoglobin: 9.5 g/dL — ABNORMAL LOW (ref 12.0–15.0)
MCH: 26.2 pg (ref 26.0–34.0)
MCHC: 31.4 g/dL (ref 30.0–36.0)
MCV: 83.5 fL (ref 78.0–100.0)
Platelets: 171 10*3/uL (ref 150–400)
RBC: 3.63 MIL/uL — ABNORMAL LOW (ref 3.87–5.11)
RDW: 13.9 % (ref 11.5–15.5)
WBC: 5 10*3/uL (ref 4.0–10.5)

## 2015-04-15 LAB — IRON AND TIBC
Iron: 28 ug/dL (ref 28–170)
Saturation Ratios: 12 % (ref 10.4–31.8)
TIBC: 227 ug/dL — ABNORMAL LOW (ref 250–450)
UIBC: 199 ug/dL

## 2015-04-15 LAB — AMYLASE: Amylase: 22 U/L — ABNORMAL LOW (ref 28–100)

## 2015-04-15 LAB — VITAMIN B12: Vitamin B-12: 437 pg/mL (ref 180–914)

## 2015-04-15 LAB — FERRITIN: Ferritin: 371 ng/mL — ABNORMAL HIGH (ref 11–307)

## 2015-04-15 LAB — FOLATE: Folate: 30.8 ng/mL (ref >5.9)

## 2015-04-15 LAB — T4, FREE: Free T4: 3.32 ng/dL — ABNORMAL HIGH (ref 0.61–1.12)

## 2015-04-15 LAB — OCCULT BLOOD X 1 CARD TO LAB, STOOL: Fecal Occult Bld: NEGATIVE

## 2015-04-15 MED ORDER — METHIMAZOLE 5 MG PO TABS: 5.0000 mg | ORAL_TABLET | Freq: Every day | ORAL | Status: DC

## 2015-04-15 MED ORDER — METHIMAZOLE 5 MG PO TABS
5.0000 mg | ORAL_TABLET | Freq: Every day | ORAL | Status: DC
  Filled 2015-04-15 (×2): qty 1

## 2015-04-15 MED ORDER — METOPROLOL TARTRATE 25 MG PO TABS
12.5000 mg | ORAL_TABLET | Freq: Two times a day (BID) | ORAL | Status: DC
  Administered 2015-04-15: 12.5 mg via ORAL
  Filled 2015-04-15: qty 1

## 2015-04-15 MED ORDER — METOPROLOL TARTRATE 25 MG PO TABS: 12.5000 mg | ORAL_TABLET | Freq: Two times a day (BID) | ORAL | Status: DC

## 2015-04-15 NOTE — Discharge Summary (Signed)
Physician Discharge Summary  Courtney Arnold [id number] DOB: 11/01/1940 DOA: 04/12/2015  PCP: Purvis Kilts, MD  Admit date: 04/12/2015 Discharge date: 04/15/2015  Recommendations for Outpatient Follow-up:  1. Please call and make appointment with Dr. Dorris Fetch for thyroid management 2. Follow up appointment with GI in 4 weeks 3. Follow up with PCP   Follow-up Information    Follow up with Glade Lloyd, MD. Schedule an appointment as soon as possible for a visit in 1 week.   Specialty:  Endocrinology   Contact information:   [address] [place] [address] [phone]      Discharge Diagnoses:  1. Cholelithiasis: GI consulted- appreciate their recommendations, stent placed yesterday during ERCP, patient tolerating diet, will follow up with GI in 4 weeks outpatient 2. Compression fracture: New moderate L4 burst fracture, 4 mm retropulsed bony fragments without malalignment. Status post T12 vertebral body cement augmentation with stable height loss, no signs of cord compromise, FROM lower extremities and no loss of bowel or bladder function, can follow up oupatient- patient states she saw neurosurgery last week and she will need another back surgery 3. Exophthalmos: TSH low, T3 and T4 pending, thyroid ultrasound results below, encouraged to follow up with Endocrinology outpatient within 1-2 weeks, low dose beta blocker started 4. Kyphoplasty: Procedure to be done Friday, 04/18/15 5. Anemia: likely dilutional, B12 and iron studies ordered  Discharge Condition: stable Disposition: home  Diet recommendation: heart healthy  Filed Weights   04/12/15 1946 04/13/15 0034  Weight: 55.339 kg (122 lb) 53.842 kg (118 lb 11.2 oz)    History of present illness:  Courtney Arnold is an 75 y.o. female with a hx a compression fx at T6 presents with abdominal pain that began 1 week ago and has been worsening. Her pain is located in her diffuse abdomen and does not radiate. She admits to  associated nausea and vomiting. She is unaware of any alleviating factors and has not taken anything for her symptoms PTA. She denies any diarrhea, CP, SOB, fever, chills, hematemesis, or melena. Patient states that was diagnosed with a T6 compression fracture months ago and is supposed to be scheduling surgery. While in the ED, labs revealed a unremarkable BMP, alkaline phosphatase 210, negative troponin and lactic acid, WBC of 6.7 and hgb of 10.6. Abdominal CT shows gallbladder distention and cholelithiasis without CT findings of acute cholecystitis. Worsening intra and extrahepatic biliary dilatation to level of mid common bile duct with there is apparent stenosis/stricture though, obstructing lesion not excluded. Hospitalist was asked to admit her for further workup.  Hospital Course:  While in the ED, labs revealed a unremarkable BMP, alkaline phosphatase 210, negative troponin and lactic acid, WBC of 6.7 and hgb of 10.6. Abdominal CT shows gallbladder distention and cholelithiasis without CT findings of acute cholecystitis. Worsening intra and extrahepatic biliary dilatation to level of mid common bile duct with there is apparent stenosis/stricture though, obstructing lesion not excluded. ERCP performed on 04/14/15. While no clear filling defect was appreciated study quality was skewed by contrast in colon. Biliary sphincterotomy and stent placement were performed. Patient tolerated diet and it was advanced without complication. She was found to be hyperthyroid during admission and additional thyroid tests are pending.  Thyroid ultrasound was ordered and results were Mildly enlarged and diffusely heterogeneous thyroid gland with suggestion of increased vascularity. Increased vascularity may imply a component of thyroiditis. Small mildly complex cyst in the right lobe measuring 0.5 cm likely represents a small colloid  cyst.  Patient was encouraged to follow up with Endocrinology outpatient within 1-2 weeks  and was started on a low dose beta blocker.   Discharge Instructions   Current Discharge Medication List    START taking these medications   Details  metoprolol tartrate (LOPRESSOR) 25 MG tablet Take 0.5 tablets (12.5 mg total) by mouth 2 (two) times daily. Qty: 60 tablet, Refills: 1      CONTINUE these medications which have NOT CHANGED   Details  aspirin EC 81 MG tablet Take 81 mg by mouth daily.    oxyCODONE-acetaminophen (PERCOCET/ROXICET) 5-325 MG tablet Take 1 tablet by mouth every 6 (six) hours as needed. Qty: 15 tablet, Refills: 0      STOP taking these medications     Naproxen Sodium (ALEVE) 220 MG CAPS        No Known Allergies  The results of significant diagnostics from this hospitalization (including imaging, microbiology, ancillary and laboratory) are listed below for reference.    Significant Diagnostic Studies: Ct Lumbar Spine Wo Contrast  04/07/2015  CLINICAL DATA:  Low back pain after repetitive lifting for the past 2 months. Prior vertebral augmentation. EXAM: CT LUMBAR SPINE WITHOUT CONTRAST TECHNIQUE: Multidetector CT imaging of the lumbar spine was performed without intravenous contrast administration. Multiplanar CT image reconstructions were also generated. COMPARISON:  Plain films of 07/01/2009 and MRI of 06/10/2009. FINDINGS: Soft tissues: Motion degradation. Normal aortic caliber with atherosclerosis within. No gross hydronephrosis or retroperitoneal process identified. Bones: Spinal visualization from the top of T12 to the upper sacrum. Status post vertebral augmentation at T12 secondary to an underlying moderate compression deformity. Lumbar vertebral body height is maintained. Loss of intervertebral disc height at the lumbosacral junction. Mild disc bulges at L3-4 and L4-5. Combined with facet arthropathy and ligamentum flavum thickening, these result in mild central canal stenosis. IMPRESSION: 1. Lumbar spondylosis and degenerative disc disease,  without acute osseous finding. 2. Status post T12 vertebral augmentation. Electronically Signed   By: Abigail Miyamoto M.D.   On: 04/07/2015 19:17   US Soft Tissue Head/neck  04/14/2015  CLINICAL DATA:  Hyperthyroidism.  TSH of 0.02. EXAM: THYROID ULTRASOUND TECHNIQUE: Ultrasound examination of the thyroid gland and adjacent soft tissues was performed. COMPARISON:  None. FINDINGS: Right thyroid lobe Measurements: 4.9 x 1.8 x 1.9 cm. The right lobe is mildly enlarged and diffusely heterogeneous. There is a focal mildly complex cyst in the mid right lobe measuring 0.5 x 0.3 x 0.5 cm and likely representing a small colloid cyst. No solid nodules are identified. Left thyroid lobe Measurements: 4.9 x 1.8 x 1.8 cm. The left lobe is mildly enlarged and diffusely heterogeneous. No nodules are identified. Isthmus Thickness: 0.9 cm.  No nodules visualized. There is suggestion of mildly increased diffuse vascularity throughout the thyroid parenchyma. This is nonspecific but may imply a component of thyroiditis. Lymphadenopathy None visualized. IMPRESSION: Mildly enlarged and diffusely heterogeneous thyroid gland with suggestion of increased vascularity. Increased vascularity may imply a component of thyroiditis. Small mildly complex cyst in the right lobe measuring 0.5 cm likely represents a small colloid cyst. Electronically Signed   By: Aletta Edouard M.D.   On: 04/14/2015 15:09   Ct Abdomen Pelvis W Contrast  04/12/2015  CLINICAL DATA:  Nausea, vomiting and mid abdominal pain for 1 day. History of spinal fractures, atrial fibrillation. EXAM: CT ABDOMEN AND PELVIS WITH CONTRAST TECHNIQUE: Multidetector CT imaging of the abdomen and pelvis was performed using the standard protocol following bolus administration of intravenous contrast.  CONTRAST:  154mL OMNIPAQUE IOHEXOL 300 MG/ML  SOLN COMPARISON:  RIGHT upper quadrant ultrasound April 08, 2015 and CT thoracolumbar spine April 07, 2015. FINDINGS: LUNG BASES: Included  view of the lung bases are clear. Visualized heart and pericardium are unremarkable. SOLID ORGANS: The gallbladder is very distended, with subcentimeter gallstone. No gallbladder wall thickening or pericholecystic fluid. Moderate intrahepatic biliary dilatation to the level of the mid Common bile duct without CT findings of choledocholithiasis. Common bile duct was 11 mm, now 14 mm. No pancreatic head mass. No significant pancreatic ductal dilatation. Spleen, adrenal glands are normal. GASTROINTESTINAL TRACT: The stomach, small and large bowel are normal in course and caliber without inflammatory changes. Mild descending colon and sigmoid diverticulosis. Moderate amount of retained ascending colonic stool. Normal appendix. KIDNEYS/ URINARY TRACT: Kidneys are orthotopic, demonstrating symmetric enhancement. No nephrolithiasis, hydronephrosis or solid renal masses. 12 mm simple cyst upper pole RIGHT kidney. Too small to characterize hypodensities in the kidneys bilaterally. 3.2 cm LEFT lower pole cyst. The unopacified ureters are normal in course and caliber. Delayed imaging through the kidneys demonstrates symmetric prompt contrast excretion within the proximal urinary collecting system. Urinary bladder is well distended and unremarkable. PERITONEUM/RETROPERITONEUM: Aortoiliac vessels are normal in course and caliber, mild calcific atherosclerosis. No lymphadenopathy by CT size criteria. Internal reproductive organs are unremarkable. No intraperitoneal free fluid nor free air. SOFT TISSUE/OSSEOUS STRUCTURES: Patient is osteopenic. New moderate L4 burst fracture with 30-50% height loss, 4 mm retro post bony fragments. Severe L5-S1 degenerative disc. Status post T12 vertebral body cement augmentation without progressed height loss. Punctate calcification RIGHT breast. IMPRESSION: New moderate L4 burst fracture, 4 mm retropulsed bony fragments without malalignment. Status post T12 vertebral body cement augmentation with  stable height loss. Gallbladder distention and cholelithiasis without CT findings of acute cholecystitis. Worsening intra and extrahepatic biliary dilatation to level of mid Common bile duct with there is apparent stenosis/stricture though, obstructing lesion not excluded. Electronically Signed   By: Elon Alas M.D.   On: 04/12/2015 22:57   Dg Ercp Biliary & Pancreatic Ducts  04/14/2015  CLINICAL DATA:  Intrahepatic and extrahepatic biliary dilatation with abrupt decrease in diameter of distal CBD at the pancreatic head on CT, cholelithiasis, question obstruction EXAM: ERCP with sphincterotomy TECHNIQUE: Multiple spot images obtained with the fluoroscopic device and submitted for interpretation post-procedure. FLUOROSCOPY TIME:  Radiation Exposure Index (as provided by the fluoroscopic device): Not provided If the device does not provide the exposure index: Fluoroscopy Time:  3 minutes 4 seconds Number of Acquired Images:  4 cine series plus 9 single frame images COMPARISON:  CT abdomen pelvis 04/12/2015 FINDINGS: Retained contrast within the hepatic flexure of the colon from preceding CT limits exam, being superimposed with the distal CBD. Images demonstrate a significantly dilated CBD, in access of the diameter of the 14 mm diameter endoscope. Dilatation of the common hepatic duct and central intrahepatic biliary radicles also identified. No definite intraluminal filling defects are visualized. Distal CBD poorly assessed due to superimposed contrast, unable to exclude distal CBD pathology by this exam. Biliary stent was placed at the conclusion of the procedure with good drainage of contrast. IMPRESSION: Significantly dilated intrahepatic and extrahepatic biliary trees though the distal CBD is suboptimally visualized due to superimposed retained contrast in the hepatic flexure of the colon. Cause of distal CBD obstruction is uncertain and distal CBD pathology is not excluded by this study. Good drainage  of biliary contrast following CBD stenting. These images were submitted for radiologic interpretation only.  Please see the procedural report for the amount of contrast and the fluoroscopy time utilized. Electronically Signed   By: Lavonia Dana M.D.   On: 04/14/2015 11:25   Ct Angio Chest Aorta W/cm &/or Wo/cm  04/07/2015  CLINICAL DATA:  Thoracic back pain from shoulder blades with lower back for 2 months. EXAM: CT ANGIOGRAPHY CHEST, ABDOMEN AND PELVIS TECHNIQUE: Multidetector CT imaging through the chest, abdomen and pelvis was performed using the standard protocol during bolus administration of intravenous contrast. Multiplanar reconstructed images and MIPs were obtained and reviewed to evaluate the vascular anatomy. CONTRAST:  129mL OMNIPAQUE IOHEXOL 350 MG/ML SOLN COMPARISON:  Chest CT 01/16/2004 FINDINGS: CTA CHEST FINDINGS THORACIC INLET/BODY WALL: No acute abnormality. MEDIASTINUM: Normal heart size. No pericardial effusion. No evidence of acute aortic syndrome, including intramural hematoma, dissection, or penetrating ulcer. There is atherosclerosis of the aorta without aneurysm. Opacified pulmonary arteries are patent. No adenopathy. LUNG WINDOWS: No consolidation. No effusion. No suspicious pulmonary nodule. Mild scarring in the lingula and in the right lower lobe near a endplate spur. OSSEOUS: T6 compression fracture with a horizontal band of sclerosis in the upper body, extending vertically at the junction of the body and pedicles. Height loss is in the 25-50% range. No retropulsion. No discrete fracture line is seen and the injury is likely subacute. Remote T12 compression fracture status post cement augmentation. Usual degenerative changes, with focal advanced disc narrowing at L5-S1. Review of the MIP images confirms the above findings. CTA ABDOMEN AND PELVIS FINDINGS Abdominal wall:  No contributory findings. Hepatobiliary: No focal liver abnormality. Dilated gallbladder and biliary tree. Common  bile duct at pancreatic head measures 12 mm in diameter. Cholelithiasis but no calcified choledocholithiasis. No evidence of ampullary region mass. No inflammatory changes around the gallbladder. Pancreas: Unremarkable. Spleen: Unremarkable. Adrenals/Urinary Tract: Negative adrenals. No hydronephrosis or stone. 25 mm left renal cyst. Unremarkable bladder. Reproductive:  Pelvic floor laxity. Stomach/Bowel:  No obstruction. No inflammatory changes. Vascular/Lymphatic: Standard aortic branching. Atherosclerosis without aneurysm or dissection. No mass or adenopathy. Peritoneal: No ascites or pneumoperitoneum. Musculoskeletal: Please see above concerning thoracic spine fracture. Review of the MIP images confirms the above findings. IMPRESSION: 1. T6 compression fracture with moderate height loss, likely subacute. 2. No evidence for acute aortic syndrome. 3. Dilated gallbladder and biliary tree without visible cause (non-obstructive gallstone present). Correlate with liver function tests and consider sonography or cholangiography. Electronically Signed   By: Monte Fantasia M.D.   On: 04/07/2015 21:23   Ct Cta Abd/pel W/cm &/or W/o Cm  04/07/2015  CLINICAL DATA:  Thoracic back pain from shoulder blades with lower back for 2 months. EXAM: CT ANGIOGRAPHY CHEST, ABDOMEN AND PELVIS TECHNIQUE: Multidetector CT imaging through the chest, abdomen and pelvis was performed using the standard protocol during bolus administration of intravenous contrast. Multiplanar reconstructed images and MIPs were obtained and reviewed to evaluate the vascular anatomy. CONTRAST:  196mL OMNIPAQUE IOHEXOL 350 MG/ML SOLN COMPARISON:  Chest CT 01/16/2004 FINDINGS: CTA CHEST FINDINGS THORACIC INLET/BODY WALL: No acute abnormality. MEDIASTINUM: Normal heart size. No pericardial effusion. No evidence of acute aortic syndrome, including intramural hematoma, dissection, or penetrating ulcer. There is atherosclerosis of the aorta without aneurysm.  Opacified pulmonary arteries are patent. No adenopathy. LUNG WINDOWS: No consolidation. No effusion. No suspicious pulmonary nodule. Mild scarring in the lingula and in the right lower lobe near a endplate spur. OSSEOUS: T6 compression fracture with a horizontal band of sclerosis in the upper body, extending vertically at the junction of the body  and pedicles. Height loss is in the 25-50% range. No retropulsion. No discrete fracture line is seen and the injury is likely subacute. Remote T12 compression fracture status post cement augmentation. Usual degenerative changes, with focal advanced disc narrowing at L5-S1. Review of the MIP images confirms the above findings. CTA ABDOMEN AND PELVIS FINDINGS Abdominal wall:  No contributory findings. Hepatobiliary: No focal liver abnormality. Dilated gallbladder and biliary tree. Common bile duct at pancreatic head measures 12 mm in diameter. Cholelithiasis but no calcified choledocholithiasis. No evidence of ampullary region mass. No inflammatory changes around the gallbladder. Pancreas: Unremarkable. Spleen: Unremarkable. Adrenals/Urinary Tract: Negative adrenals. No hydronephrosis or stone. 25 mm left renal cyst. Unremarkable bladder. Reproductive:  Pelvic floor laxity. Stomach/Bowel:  No obstruction. No inflammatory changes. Vascular/Lymphatic: Standard aortic branching. Atherosclerosis without aneurysm or dissection. No mass or adenopathy. Peritoneal: No ascites or pneumoperitoneum. Musculoskeletal: Please see above concerning thoracic spine fracture. Review of the MIP images confirms the above findings. IMPRESSION: 1. T6 compression fracture with moderate height loss, likely subacute. 2. No evidence for acute aortic syndrome. 3. Dilated gallbladder and biliary tree without visible cause (non-obstructive gallstone present). Correlate with liver function tests and consider sonography or cholangiography. Electronically Signed   By: Monte Fantasia M.D.   On: 04/07/2015  21:23   US Abdomen Limited Ruq  04/08/2015  CLINICAL DATA:  Gallbladder dilation seen on CT. EXAM: US ABDOMEN LIMITED - RIGHT UPPER QUADRANT COMPARISON:  CT of the chest abdomen pelvis dated 04/07/2015 FINDINGS: Gallbladder: The gallbladder is prominent, however sub pathologically distended, measuring 4.5 cm transversely. There is no evidence of gallbladder wall thickening. Gallbladder wall measures 1.7 mm. There is a small bowel 1.5 cm gallbladder wall calculus. The sonographic Percell Miller sign is negative. Common bile duct: Diameter: The extrahepatic common bile duct is diffusely dilated measuring up to 1.1 cm. There is also a diffuse dilation of the main pancreatic duct which measures up to 3.7 mm. Liver: No focal lesion identified. Within normal limits in parenchymal echogenicity. Mild intra hepatic biliary ductal dilation is also seen. IMPRESSION: Cholelithiasis without evidence of acute cholecystitis. Prominent size of the gallbladder, although still sub pathologic. Diffuse dilation of the intra and extrahepatic bile ducts, and main pancreatic duct. Pancreas is not well visualized, as it was obscured by bowel gas. Obstruction at the level of the ampulla could not be excluded. Electronically Signed   By: Fidela Salisbury M.D.   On: 04/08/2015 08:43    Microbiology: Recent Results (from the past 240 hour(s))  MRSA PCR Screening     Status: None   Collection Time: 04/14/15  3:00 AM  Result Value Ref Range Status   MRSA by PCR NEGATIVE NEGATIVE Final    Comment:        The GeneXpert MRSA Assay (FDA approved for NASAL specimens only), is one component of a comprehensive MRSA colonization surveillance program. It is not intended to diagnose MRSA infection nor to guide or monitor treatment for MRSA infections.      Labs: Basic Metabolic Panel:  Recent Labs Lab 04/12/15 2023 04/14/15 0615 04/15/15 0644  NA 140 139 138  K 3.4* 4.1 3.6  CL 99* 102 101  CO2 31 33* 31  GLUCOSE 121* 146*  140*  BUN 15 8 5*  CREATININE 0.38* 0.39* 0.34*  CALCIUM 9.5 8.9 8.8*   Liver Function Tests:  Recent Labs Lab 04/12/15 2023 04/14/15 0615 04/15/15 0644  AST 44* 34 57*  ALT 35 29 47  ALKPHOS 210* 150* 185*  BILITOT 0.8 0.4 0.7  PROT 7.3 6.1* 6.0*  ALBUMIN 3.8 3.2* 3.1*    Recent Labs Lab 04/12/15 2023 04/15/15 0644  LIPASE 28  --   AMYLASE  --  22*   No results for input(s): AMMONIA in the last 168 hours. CBC:  Recent Labs Lab 04/12/15 2023 04/15/15 0644  WBC 6.7 5.0  NEUTROABS 3.6  --   HGB 10.6* 9.5*  HCT 33.1* 30.3*  MCV 82.3 83.5  PLT 238 171   Cardiac Enzymes:  Recent Labs Lab 04/12/15 2023  TROPONINI <0.03   BNP: BNP (last 3 results) No results for input(s): BNP in the last 8760 hours.  ProBNP (last 3 results) No results for input(s): PROBNP in the last 8760 hours.  CBG:  Recent Labs Lab 04/13/15 0809 04/13/15 1212 04/13/15 1643 04/14/15 0801 04/14/15 1626  GLUCAP 128* 107* 123* 134* 114*    Principal Problem:   Cholelithiasis Active Problems:   TOBACCO ABUSE   Compression fracture   Abdominal pain   AP (abdominal pain)   Common bile duct dilation   Hyperthyroidism   Time coordinating discharge: >30 minutes  Signed: Coralie Common Resident Physician 04/15/2015, 1:40 PM

## 2015-04-15 NOTE — Progress Notes (Signed)
Patient has had 3 loose stools tonight. Contacted hospitalist to inquire whether or not we should test stool for c.diff. Awaiting response.

## 2015-04-15 NOTE — Progress Notes (Signed)
  Subjective:  Patient has no complaints other than back pain which she experienced when she moves around. She says she has good appetite. She denies nausea vomiting abdominal pain. She had multiple loose stools during the night. No melena or rectal bleeding reported.  Objective: Blood pressure 143/62, pulse 112, temperature 98.4 F (36.9 C), temperature source Oral, resp. rate 20, height 5\' 2"  (1.575 m), weight 118 lb 11.2 oz (53.842 kg), SpO2 92 %. Patient is alert and not restless she was yesterday. Abdominal exam reveals normal bowel sounds soft abdomen without tenderness organomegaly or masses.   Labs/studies Results:   Recent Labs  04/12/15 2023 04/15/15 0644  WBC 6.7 5.0  HGB 10.6* 9.5*  HCT 33.1* 30.3*  PLT 238 171    BMET   Recent Labs  04/12/15 2023 04/14/15 0615 04/15/15 0644  NA 140 139 138  K 3.4* 4.1 3.6  CL 99* 102 101  CO2 31 33* 31  GLUCOSE 121* 146* 140*  BUN 15 8 5*  CREATININE 0.38* 0.39* 0.34*  CALCIUM 9.5 8.9 8.8*    LFT   Recent Labs  04/12/15 2023 04/14/15 0615 04/15/15 0644  PROT 7.3 6.1* 6.0*  ALBUMIN 3.8 3.2* 3.1*  AST 44* 34 57*  ALT 35 29 47  ALKPHOS 210* 150* 185*  BILITOT 0.8 0.4 0.7  BILIDIR  --  0.1  --   IBILI  --  0.3  --     PT/INR   Recent Labs  04/14/15 0615  LABPROT 14.7  INR 1.13     Assessment:  #1. 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 Patient is status post ERCP with stenting yesterday and is doing well. Mild bump in transaminases and alkaline phosphatase not unexpected following the procedure. No evidence of pancreatitis.  #2. Anemia. No evidence of GI bleed. Drop in H&H secondary to hydration. Will check stool Hemoccults and iron studies as well as  B12 and folate levels. #3. Diarrhea. She had multiple bowel movements during the night. Stool C. difficile pending.  #4. Hyperthyroidism. TSH is low. Other studies are pending.  Recommendations:  Hemoccult 1. Serum iron and  TIBC and ferritin levels. B12 and folate levels. Office visit in 4 weeks at which time will schedule patient for this repeat ERCP and stent removal. Consultation for cholecystectomy on outpatient basis.

## 2015-04-15 NOTE — Anesthesia Postprocedure Evaluation (Signed)
Anesthesia Post Note  Patient: Courtney Arnold  Procedure(s) Performed: Procedure(s) (LRB): ENDOSCOPIC RETROGRADE CHOLANGIOPANCREATOGRAPHY (ERCP) Biliary Sphincterotomy, 10x7 stent placement Dilated bilary system just not well seen (N/A)  Patient location during evaluation: Nursing Unit Anesthesia Type: General Level of consciousness: awake and alert Pain management: pain level controlled Vital Signs Assessment: post-procedure vital signs reviewed and stable Respiratory status: spontaneous breathing Cardiovascular status: stable Postop Assessment: no signs of nausea or vomiting Anesthetic complications: no    Last Vitals:  Filed Vitals:   04/15/15 0550 04/15/15 1446  BP: 143/62 128/63  Pulse: 112 102  Temp: 36.9 C 37 C  Resp: 20 20    Last Pain:  Filed Vitals:   04/15/15 1446  PainSc: 10-Worst pain ever                 Aloysuis Ribaudo A

## 2015-04-15 NOTE — Progress Notes (Signed)
Pt discharged with all belongings, IV removed and intact and with prescriptions.  Daughter to drive patient home

## 2015-04-15 NOTE — Addendum Note (Signed)
Addendum  created 04/15/15 1603 by Mickel Baas, CRNA   Modules edited: Clinical Notes   Clinical Notes:  File: [id number]

## 2015-04-16 LAB — T3: T3, Total: 206 ng/dL — ABNORMAL HIGH (ref 71–180)

## 2015-04-17 ENCOUNTER — Telehealth (INDEPENDENT_AMBULATORY_CARE_PROVIDER_SITE_OTHER): Payer: Self-pay | Admitting: Internal Medicine

## 2015-04-17 NOTE — Telephone Encounter (Signed)
Dr Laural Golden, please advise, your progress notes states surgical consult, which does patient need. thanks

## 2015-04-17 NOTE — Telephone Encounter (Signed)
Courtney Arnold, the pt's daughter called saying Dr. Laural Golden performed a procedure on Courtney Arnold this past Tuesday and recommended she follow-up with Dr. Dorris Fetch. Dr. Liliane Channel office won't schedule a follow-up appointment until a referral is sent to their office from Dr. Olevia Perches office. Please give the patient a call if necessary.  [name] ph# [phone] or [phone] Thank you.

## 2015-04-17 NOTE — Telephone Encounter (Signed)
Forwarded to Ann for review. 

## 2015-04-18 DIAGNOSIS — S22000A Wedge compression fracture of unspecified thoracic vertebra, initial encounter for closed fracture: Secondary | ICD-10-CM | POA: Diagnosis not present

## 2015-04-18 DIAGNOSIS — M4854XA Collapsed vertebra, not elsewhere classified, thoracic region, initial encounter for fracture: Secondary | ICD-10-CM | POA: Diagnosis not present

## 2015-04-18 DIAGNOSIS — Y939 Activity, unspecified: Secondary | ICD-10-CM | POA: Diagnosis not present

## 2015-04-18 DIAGNOSIS — Y929 Unspecified place or not applicable: Secondary | ICD-10-CM | POA: Diagnosis not present

## 2015-04-18 DIAGNOSIS — S22050A Wedge compression fracture of T5-T6 vertebra, initial encounter for closed fracture: Secondary | ICD-10-CM | POA: Diagnosis not present

## 2015-04-18 DIAGNOSIS — Y999 Unspecified external cause status: Secondary | ICD-10-CM | POA: Diagnosis not present

## 2015-04-18 DIAGNOSIS — X58XXXA Exposure to other specified factors, initial encounter: Secondary | ICD-10-CM | POA: Diagnosis not present

## 2015-04-18 DIAGNOSIS — M546 Pain in thoracic spine: Secondary | ICD-10-CM | POA: Diagnosis not present

## 2015-04-18 NOTE — Telephone Encounter (Signed)
Will arrange surgical consultation after next ERCP in 4 weeks or so.

## 2015-04-21 ENCOUNTER — Inpatient Hospital Stay (HOSPITAL_COMMUNITY)
Admission: EM | Admit: 2015-04-21 | Discharge: 2015-04-26 | DRG: 419 | Disposition: A | Discharge status: Home / Self Care | Payer: Medicare Other | Attending: Internal Medicine | Admitting: Internal Medicine

## 2015-04-21 ENCOUNTER — Encounter (HOSPITAL_COMMUNITY): Payer: Self-pay | Admitting: *Deleted

## 2015-04-21 ENCOUNTER — Emergency Department (HOSPITAL_COMMUNITY): Payer: Medicare Other

## 2015-04-21 DIAGNOSIS — Z7982 Long term (current) use of aspirin: Secondary | ICD-10-CM

## 2015-04-21 DIAGNOSIS — E0590 Thyrotoxicosis, unspecified without thyrotoxic crisis or storm: Secondary | ICD-10-CM | POA: Diagnosis present

## 2015-04-21 DIAGNOSIS — E069 Thyroiditis, unspecified: Secondary | ICD-10-CM | POA: Diagnosis present

## 2015-04-21 DIAGNOSIS — M5124 Other intervertebral disc displacement, thoracic region: Secondary | ICD-10-CM | POA: Diagnosis not present

## 2015-04-21 DIAGNOSIS — E860 Dehydration: Secondary | ICD-10-CM | POA: Diagnosis present

## 2015-04-21 DIAGNOSIS — E876 Hypokalemia: Secondary | ICD-10-CM | POA: Diagnosis not present

## 2015-04-21 DIAGNOSIS — K8000 Calculus of gallbladder with acute cholecystitis without obstruction: Secondary | ICD-10-CM | POA: Diagnosis present

## 2015-04-21 DIAGNOSIS — S32020S Wedge compression fracture of second lumbar vertebra, sequela: Secondary | ICD-10-CM | POA: Diagnosis not present

## 2015-04-21 DIAGNOSIS — Z823 Family history of stroke: Secondary | ICD-10-CM

## 2015-04-21 DIAGNOSIS — Z87891 Personal history of nicotine dependence: Secondary | ICD-10-CM

## 2015-04-21 DIAGNOSIS — Z8249 Family history of ischemic heart disease and other diseases of the circulatory system: Secondary | ICD-10-CM

## 2015-04-21 DIAGNOSIS — R1110 Vomiting, unspecified: Secondary | ICD-10-CM | POA: Diagnosis not present

## 2015-04-21 DIAGNOSIS — R740 Nonspecific elevation of levels of transaminase and lactic acid dehydrogenase [LDH]: Secondary | ICD-10-CM | POA: Diagnosis present

## 2015-04-21 DIAGNOSIS — K8020 Calculus of gallbladder without cholecystitis without obstruction: Secondary | ICD-10-CM | POA: Diagnosis not present

## 2015-04-21 DIAGNOSIS — I4891 Unspecified atrial fibrillation: Secondary | ICD-10-CM | POA: Diagnosis not present

## 2015-04-21 DIAGNOSIS — H04129 Dry eye syndrome of unspecified lacrimal gland: Secondary | ICD-10-CM | POA: Diagnosis present

## 2015-04-21 DIAGNOSIS — R109 Unspecified abdominal pain: Secondary | ICD-10-CM | POA: Diagnosis not present

## 2015-04-21 DIAGNOSIS — D638 Anemia in other chronic diseases classified elsewhere: Secondary | ICD-10-CM | POA: Diagnosis present

## 2015-04-21 DIAGNOSIS — Z419 Encounter for procedure for purposes other than remedying health state, unspecified: Secondary | ICD-10-CM

## 2015-04-21 DIAGNOSIS — Z79899 Other long term (current) drug therapy: Secondary | ICD-10-CM | POA: Diagnosis not present

## 2015-04-21 DIAGNOSIS — Z9889 Other specified postprocedural states: Secondary | ICD-10-CM

## 2015-04-21 DIAGNOSIS — D649 Anemia, unspecified: Secondary | ICD-10-CM | POA: Diagnosis present

## 2015-04-21 DIAGNOSIS — M4856XS Collapsed vertebra, not elsewhere classified, lumbar region, sequela of fracture: Secondary | ICD-10-CM | POA: Diagnosis present

## 2015-04-21 DIAGNOSIS — M545 Low back pain, unspecified: Secondary | ICD-10-CM | POA: Insufficient documentation

## 2015-04-21 DIAGNOSIS — Z803 Family history of malignant neoplasm of breast: Secondary | ICD-10-CM

## 2015-04-21 DIAGNOSIS — K819 Cholecystitis, unspecified: Secondary | ICD-10-CM | POA: Diagnosis not present

## 2015-04-21 DIAGNOSIS — R1011 Right upper quadrant pain: Secondary | ICD-10-CM | POA: Diagnosis not present

## 2015-04-21 DIAGNOSIS — E039 Hypothyroidism, unspecified: Secondary | ICD-10-CM | POA: Diagnosis not present

## 2015-04-21 DIAGNOSIS — S32020A Wedge compression fracture of second lumbar vertebra, initial encounter for closed fracture: Secondary | ICD-10-CM | POA: Insufficient documentation

## 2015-04-21 DIAGNOSIS — K831 Obstruction of bile duct: Secondary | ICD-10-CM | POA: Diagnosis not present

## 2015-04-21 DIAGNOSIS — K810 Acute cholecystitis: Secondary | ICD-10-CM | POA: Diagnosis not present

## 2015-04-21 DIAGNOSIS — K8010 Calculus of gallbladder with chronic cholecystitis without obstruction: Secondary | ICD-10-CM | POA: Diagnosis not present

## 2015-04-21 DIAGNOSIS — R1084 Generalized abdominal pain: Secondary | ICD-10-CM | POA: Diagnosis not present

## 2015-04-21 DIAGNOSIS — K219 Gastro-esophageal reflux disease without esophagitis: Secondary | ICD-10-CM | POA: Diagnosis present

## 2015-04-21 DIAGNOSIS — R112 Nausea with vomiting, unspecified: Secondary | ICD-10-CM | POA: Diagnosis not present

## 2015-04-21 DIAGNOSIS — E869 Volume depletion, unspecified: Secondary | ICD-10-CM

## 2015-04-21 LAB — COMPREHENSIVE METABOLIC PANEL
ALT: 18 U/L (ref 14–54)
AST: 28 U/L (ref 15–41)
Albumin: 3.5 g/dL (ref 3.5–5.0)
Alkaline Phosphatase: 153 U/L — ABNORMAL HIGH (ref 38–126)
Anion gap: 7 (ref 5–15)
BUN: 10 mg/dL (ref 6–20)
CO2: 34 mmol/L — ABNORMAL HIGH (ref 22–32)
Calcium: 9.2 mg/dL (ref 8.9–10.3)
Chloride: 96 mmol/L — ABNORMAL LOW (ref 101–111)
Creatinine, Ser: <0.3 mg/dL — ABNORMAL LOW (ref 0.44–1.00)
Glucose, Bld: 105 mg/dL — ABNORMAL HIGH (ref 65–99)
Potassium: 2.7 mmol/L — CL (ref 3.5–5.1)
Sodium: 137 mmol/L (ref 135–145)
Total Bilirubin: 0.6 mg/dL (ref 0.3–1.2)
Total Protein: 6.5 g/dL (ref 6.5–8.1)

## 2015-04-21 LAB — CBC
HCT: 33.3 % — ABNORMAL LOW (ref 36.0–46.0)
Hemoglobin: 10.9 g/dL — ABNORMAL LOW (ref 12.0–15.0)
MCH: 26.4 pg (ref 26.0–34.0)
MCHC: 32.7 g/dL (ref 30.0–36.0)
MCV: 80.6 fL (ref 78.0–100.0)
Platelets: 310 10*3/uL (ref 150–400)
RBC: 4.13 MIL/uL (ref 3.87–5.11)
RDW: 14.3 % (ref 11.5–15.5)
WBC: 6.4 10*3/uL (ref 4.0–10.5)

## 2015-04-21 LAB — URINALYSIS, ROUTINE W REFLEX MICROSCOPIC
Bilirubin Urine: NEGATIVE
Glucose, UA: NEGATIVE mg/dL
Hgb urine dipstick: NEGATIVE
Ketones, ur: NEGATIVE mg/dL
Leukocytes, UA: NEGATIVE
Nitrite: NEGATIVE
Protein, ur: NEGATIVE mg/dL
Specific Gravity, Urine: 1.01 (ref 1.005–1.030)
pH: 7 (ref 5.0–8.0)

## 2015-04-21 LAB — I-STAT CG4 LACTIC ACID, ED: Lactic Acid, Venous: 0.71 mmol/L (ref 0.5–2.0)

## 2015-04-21 LAB — LIPASE, BLOOD: Lipase: 13 U/L (ref 11–51)

## 2015-04-21 MED ORDER — MORPHINE SULFATE (PF) 4 MG/ML IV SOLN
4.0000 mg | INTRAVENOUS | Status: AC | PRN
  Administered 2015-04-21 (×2): 4 mg via INTRAVENOUS
  Filled 2015-04-21 (×2): qty 1

## 2015-04-21 MED ORDER — ONDANSETRON HCL 4 MG/2ML IJ SOLN
4.0000 mg | INTRAMUSCULAR | Status: AC | PRN
  Administered 2015-04-21 (×2): 4 mg via INTRAVENOUS
  Filled 2015-04-21 (×2): qty 2

## 2015-04-21 MED ORDER — POTASSIUM CHLORIDE 10 MEQ/100ML IV SOLN
10.0000 meq | Freq: Once | INTRAVENOUS | Status: AC
  Administered 2015-04-21: 10 meq via INTRAVENOUS
  Filled 2015-04-21: qty 100

## 2015-04-21 MED ORDER — SODIUM CHLORIDE 0.9 % IV SOLN
INTRAVENOUS | Status: DC
  Administered 2015-04-21: 21:00:00 via INTRAVENOUS

## 2015-04-21 MED ORDER — POTASSIUM CHLORIDE 10 MEQ/100ML IV SOLN
10.0000 meq | INTRAVENOUS | Status: AC
  Administered 2015-04-21 – 2015-04-22 (×3): 10 meq via INTRAVENOUS
  Filled 2015-04-21 (×3): qty 100

## 2015-04-21 MED ORDER — DIATRIZOATE MEGLUMINE & SODIUM 66-10 % PO SOLN
ORAL | Status: AC
  Filled 2015-04-21: qty 30

## 2015-04-21 MED ORDER — IOHEXOL 300 MG/ML  SOLN
100.0000 mL | Freq: Once | INTRAMUSCULAR | Status: AC | PRN
  Administered 2015-04-21: 100 mL via INTRAVENOUS

## 2015-04-21 MED ORDER — FENTANYL CITRATE (PF) 100 MCG/2ML IJ SOLN
50.0000 ug | Freq: Once | INTRAMUSCULAR | Status: AC
  Administered 2015-04-21: 50 ug via NASAL
  Filled 2015-04-21: qty 2

## 2015-04-21 NOTE — Telephone Encounter (Signed)
Left message advising patient of Dr Olevia Perches recommendation

## 2015-04-21 NOTE — ED Provider Notes (Signed)
CSN: [id number]     Arrival date & time 04/21/15  1446 History   First MD Initiated Contact with Patient 04/21/15 [place]     Chief Complaint  Patient presents with  . Abdominal Pain      HPI  Pt was seen at 1850. Per pt and her family, c/o gradual onset and worsening of persistent abd "pain" for the past 3 days. Has been associated with multiple intermittent episodes of N/V. Pt states she has been unable to tol PO due to her symptoms. 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 Denies diarrhea, no CP/SOB, no black or blood in stools or emesis, no fevers.   Past Medical History  Diagnosis Date  . Compression fracture 07/24/09    T12; kyphoplasty  . Atrial fibrillation ([place]) 2011    Postop, spontaneous conversion to normal sinus after one hour  . Tobacco abuse   . History of echocardiogram 5/11    EF 65%   Past Surgical History  Procedure Laterality Date  . Back surgery    . Ercp N/A 04/14/2015    Procedure: ENDOSCOPIC RETROGRADE CHOLANGIOPANCREATOGRAPHY (ERCP) Biliary Sphincterotomy, 10x7 stent placement Dilated bilary system just not well seen;  Surgeon: Rogene Houston, MD;  Location: [facility];  Service: Endoscopy;  Laterality: N/A;   Family History  Problem Relation Age of Onset  . Heart attack Father   . Stroke Father   . Breast cancer Sister    Social History  Substance Use Topics  . Smoking status: Former Smoker -- 0.15 packs/day for 20 years  . Smokeless tobacco: None  . Alcohol Use: No    Review of Systems ROS: Statement: All systems negative except as marked or noted in the HPI; Constitutional: Negative for fever and chills. ; ; Eyes: Negative for eye pain, redness and discharge. ; ; ENMT: Negative for ear pain, hoarseness, nasal congestion, sinus pressure and sore throat. ; ; Cardiovascular: Negative for chest pain, palpitations, diaphoresis, dyspnea and peripheral edema. ; ; Respiratory: Negative for cough, wheezing  and stridor. ; ; Gastrointestinal: +N/V, abd pain. Negative for diarrhea, blood in stool, hematemesis, jaundice and rectal bleeding. . ; ; Genitourinary: Negative for dysuria, flank pain and hematuria. ; ; Musculoskeletal: Negative for back pain and neck pain. Negative for swelling and trauma.; ; Skin: Negative for pruritus, rash, abrasions, blisters, bruising and skin lesion.; ; Neuro: Negative for headache, lightheadedness and neck stiffness. Negative for weakness, altered level of consciousness , altered mental status, extremity weakness, paresthesias, involuntary movement, seizure and syncope.      Allergies  Review of patient's allergies indicates no known allergies.  Home Medications   Prior to Admission medications   Medication Sig Start Date End Date Taking? Authorizing Provider  aspirin EC 81 MG tablet Take 81 mg by mouth daily.   Yes Historical Provider, MD  methimazole (TAPAZOLE) 5 MG tablet Take 1 tablet (5 mg total) by mouth daily. 04/15/15  Yes Coralie Common, MD  metoprolol tartrate (LOPRESSOR) 25 MG tablet Take 0.5 tablets (12.5 mg total) by mouth 2 (two) times daily. 04/15/15  Yes Coralie Common, MD  oxyCODONE-acetaminophen (PERCOCET/ROXICET) 5-325 MG tablet Take 1 tablet by mouth every 6 (six) hours as needed. Patient taking differently: Take 1 tablet by mouth every 6 (six) hours as needed for moderate pain or severe pain.  04/07/15  Yes Lily Kocher, PA-C   BP 140/78 mmHg  Pulse 78  Resp 14  Ht 5\' 2"  (  1.575 m)  Wt 117 lb (53.071 kg)  BMI 21.39 kg/m2  SpO2 98% Physical Exam  1855: Physical examination:  Nursing notes reviewed; Vital signs and O2 SAT reviewed;  Constitutional: Well developed, Well nourished, Uncomfortable appearing.; Head:  Normocephalic, atraumatic; Eyes: EOMI, PERRL, No scleral icterus; ENMT: Mouth and pharynx normal, Mucous membranes dry; Neck: Supple, Full range of motion, No lymphadenopathy; Cardiovascular: Regular rate and rhythm, No gallop;  Respiratory: Breath sounds clear & equal bilaterally, No wheezes.  Speaking full sentences with ease, Normal respiratory effort/excursion; Chest: Nontender, Movement normal; Abdomen: Soft, +diffusely tender to palp. Nondistended, Normal bowel sounds; Genitourinary: No CVA tenderness; Extremities: Pulses normal, No tenderness, No edema, No calf edema or asymmetry.; Neuro: AA&Ox3, Major CN grossly intact.  Speech clear. No gross focal motor or sensory deficits in extremities.; Skin: Color normal, Warm, Dry.   ED Course  Procedures (including critical care time) Labs Review  Imaging Review  I have personally reviewed and evaluated these images and lab results as part of my medical decision-making.   EKG Interpretation None      MDM  MDM Reviewed: previous chart, nursing note and vitals Reviewed previous: labs Interpretation: labs     Results for orders placed or performed during the hospital encounter of 04/21/15  Lipase, blood  Result Value Ref Range   Lipase 13 11 - 51 U/L  Comprehensive metabolic panel  Result Value Ref Range   Sodium 137 135 - 145 mmol/L   Potassium 2.7 (LL) 3.5 - 5.1 mmol/L   Chloride 96 (L) 101 - 111 mmol/L   CO2 34 (H) 22 - 32 mmol/L   Glucose, Bld 105 (H) 65 - 99 mg/dL   BUN 10 6 - 20 mg/dL   Creatinine, Ser <0.30 (L) 0.44 - 1.00 mg/dL   Calcium 9.2 8.9 - 10.3 mg/dL   Total Protein 6.5 6.5 - 8.1 g/dL   Albumin 3.5 3.5 - 5.0 g/dL   AST 28 15 - 41 U/L   ALT 18 14 - 54 U/L   Alkaline Phosphatase 153 (H) 38 - 126 U/L   Total Bilirubin 0.6 0.3 - 1.2 mg/dL   GFR calc non Af Amer NOT CALCULATED >60 mL/min   GFR calc Af Amer NOT CALCULATED >60 mL/min   Anion gap 7 5 - 15  CBC  Result Value Ref Range   WBC 6.4 4.0 - 10.5 K/uL   RBC 4.13 3.87 - 5.11 MIL/uL   Hemoglobin 10.9 (L) 12.0 - 15.0 g/dL   HCT 33.3 (L) 36.0 - 46.0 %   MCV 80.6 78.0 - 100.0 fL   MCH 26.4 26.0 - 34.0 pg   MCHC 32.7 30.0 - 36.0 g/dL   RDW 14.3 11.5 - 15.5 %   Platelets 310 150  - 400 K/uL  I-Stat CG4 Lactic Acid, ED  Result Value Ref Range   Lactic Acid, Venous 0.71 0.5 - 2.0 mmol/L    Ct Abdomen Pelvis W Contrast 04/21/2015  CLINICAL DATA:  Recent gallbladder stent last Monday. Back surgery on Friday. Now with pain in the epigastric area. Unable to eat or drink. EXAM: CT ABDOMEN AND PELVIS WITH CONTRAST TECHNIQUE: Multidetector CT imaging of the abdomen and pelvis was performed using the standard protocol following bolus administration of intravenous contrast. CONTRAST:  114mL OMNIPAQUE IOHEXOL 300 MG/ML  SOLN COMPARISON:  04/12/2015 FINDINGS: Mild dependent changes in the lung bases. Minimal pericardial effusion. Interval placement of a biliary stent. There is some decompression of the bile ducts post stent placement  compared with previous study. Pneumobilia consistent with instrumentation. There is a stone in the gallbladder. Mild gallbladder wall thickening. No infiltration in the fat around the gallbladder. No focal liver lesions identified. The pancreas, spleen, adrenal glands, abdominal aorta, inferior vena cava, and retroperitoneal lymph nodes are unremarkable. Bilateral renal cysts, largest on the left measuring 3.1 cm diameter. No hydronephrosis. Stomach, small bowel, and colon are not abnormally distended. Stool fills the colon. Contrast material is present in the terminal ileum suggesting no evidence of bowel obstruction. No discrete wall thickening is appreciated. No free air or free fluid in the abdomen. Abdominal wall musculature appears intact. Pelvis: The appendix is normal. Uterus and ovaries are not enlarged. Bladder wall is not thickened. No free or loculated pelvic fluid collections. No pelvic mass or lymphadenopathy. Degenerative changes in the spine and shoulders. Compression fracture of L4 demonstrates mild progression since previous study. Old compression of T12 post kyphoplasty. IMPRESSION: Interval placement of a biliary stent with decompression of the bile  ducts. Pneumobilia is consistent with instrumentation. Cholelithiasis with small stone in the gallbladder. Gallbladder wall thickening is nonspecific but could indicate inflammatory change in the appropriate clinical setting. Minimal pericardial effusion. Compression fracture of L2 vertebra demonstrates mild progression since previous study. Electronically Signed   By: Lucienne Capers M.D.   On: 04/21/2015 21:51    2230:  Pt continues to c/o nausea and abd pain despite multiple doses of IV meds; will remedicate. Potassium repleted IV. Dx and testing d/w pt and family.  Questions answered.  Verb understanding, agreeable to admit. T/C to [facility] Dr. Jonnie Finner, case discussed, including:  HPI, pertinent PM/SHx, VS/PE, dx testing, ED course and treatment:  Agreeable to admit, requests to write temporary orders, obtain medical bed to team [facility].    Francine Graven, DO 04/25/15 2245

## 2015-04-21 NOTE — H&P (Addendum)
[facility] History and Physical  Courtney Arnold [id number] DOB: 01-Sep-1940 DOA: 04/21/2015  Referring physician: Dr. Thurnell Garbe PCP: Purvis Kilts, MD   Chief Complaint: Abd pain  HPI: Courtney Arnold is a 75 y.o. 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  Underwent ERCP, no bile duct stones. Stent placed for biliary decompression. Also dx'd with exopthalmos and hyperthyroidism, to f/u w endocrine in OP setting.   Patient returns with abd , n/v for the last several days. Severe mid abd pain.  No fevers, chills, CP, SOB or cough.  No joint pain, rash, HA, blurred vision or paralysis. Asked to see for admission. CT abd done in ED showed pneumobilia prob related to recent ERCP/ instrumentation, o/w no acute findings on CT. Bile ducts are decompressed w noted interval placement of biliary stent. Stones in GB, small.        Where does patient live home Can patient participate in ADLs? yes  Past Medical History  Past Medical History  Diagnosis Date  . Compression fracture 07/24/09    T12; kyphoplasty  . Atrial fibrillation ([place]) 2011    Postop, spontaneous conversion to normal sinus after one hour  . Tobacco abuse   . History of echocardiogram 5/11    EF 65%   Past Surgical History  Past Surgical History  Procedure Laterality Date  . Back surgery    . Ercp N/A 04/14/2015    Procedure: ENDOSCOPIC RETROGRADE CHOLANGIOPANCREATOGRAPHY (ERCP) Biliary Sphincterotomy, 10x7 stent placement Dilated bilary system just not well seen;  Surgeon: Rogene Houston, MD;  Location: [facility];  Service: Endoscopy;  Laterality: N/A;   Family History  Family History  Problem Relation Age of Onset  . Heart attack Father   . Stroke Father   . Breast cancer Sister    Social History  reports that she has quit smoking. She does not have any smokeless tobacco history on file.  She reports that she does not drink alcohol or use illicit drugs. Allergies No Known Allergies Home medications Prior to Admission medications   Medication Sig Start Date End Date Taking? Authorizing Provider  aspirin EC 81 MG tablet Take 81 mg by mouth daily.   Yes Historical Provider, MD  methimazole (TAPAZOLE) 5 MG tablet Take 1 tablet (5 mg total) by mouth daily. 04/15/15  Yes Coralie Common, MD  metoprolol tartrate (LOPRESSOR) 25 MG tablet Take 0.5 tablets (12.5 mg total) by mouth 2 (two) times daily. 04/15/15  Yes Coralie Common, MD  oxyCODONE-acetaminophen (PERCOCET/ROXICET) 5-325 MG tablet Take 1 tablet by mouth every 6 (six) hours as needed. Patient taking differently: Take 1 tablet by mouth every 6 (six) hours as needed for moderate pain or severe pain.  04/07/15  Yes Lily Kocher, PA-C   Liver Function Tests  Recent Labs Lab 04/15/15 0644 04/21/15 1851  AST 57* 28  ALT 47 18  ALKPHOS 185* 153*  BILITOT 0.7 0.6  PROT 6.0* 6.5  ALBUMIN 3.1* 3.5    Recent Labs Lab 04/15/15 0644 04/21/15 1851  LIPASE  --  13  AMYLASE 22*  --    CBC  Recent Labs Lab 04/15/15 0644 04/21/15 1851  WBC 5.0 6.4  HGB 9.5* 10.9*  HCT 30.3* 33.3*  MCV 83.5 80.6  PLT 171 [id number]   Basic Metabolic Panel  Recent Labs Lab 04/15/15 0644 04/21/15 1851  NA 138 137  K 3.6 2.7*  CL 101 96*  CO2 31 34*  GLUCOSE 140* 105*  BUN 5* 10  CREATININE 0.34* <0.30*  CALCIUM 8.8* 9.2     Filed Vitals:   04/21/15 1557 04/21/15 1923 04/21/15 2155 04/21/15 2230  BP: 144/51 140/78 126/78 109/66  Pulse: 111 78 67 102  Temp:    98.9 F (37.2 C)  TempSrc:    Oral  Resp: 16 14 14 14   Height: 5\' 2"  (1.575 m)     Weight: 53.071 kg (117 lb)     SpO2: 96% 98% 97% 98%   Exam: Unconfortable R eye injected conjunctiva> L eye Bilat exopthalmos R>L Sclera anicteric, throat dry No jvd Chest dec'd BS bilat but no wheeze/ rales RRR no MRG Abd scaphoid, diffusely tender, no rebound, +bs GU  defer MS no joint changes Ext no LE edema, wounds, ulcers Neuro is alert, Ox 3, nf  Home medications > tapazole, lopressor, oxycodone prn, ASA  Na 137 K 2.7  CO2 34  BUN 10 Creat 0.30  Ca 9.2  Alb 3.5  LFT's ok. 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 No sig fever/ WBC. CT w/o new change/ abscess/ pancreatitis. Plan admit, IVF's, IV pain meds, NPO for now.  Call GI in am to assess.  2 Hyperthyroidism - hold po meds for now 3 T12 comp fracture, s/p kyphoplasty 4 Hypokalemia - replace IV 5 Vol depletion - IVF's  Plan - as above   DVT Prophylaxis lovenox  Code Status: full  Family Communication: dtr at bedside  Disposition Plan: home when stable    [facility] Pager [phone]  Cell [phone]  If 7PM-7AM, please contact night-coverage [email or web address] Password [facility] 04/21/2015, 10:48 PM

## 2015-04-21 NOTE — ED Notes (Signed)
CRITICAL VALUE ALERT  Critical value received:  K+ 2.7  Date of notification:  [id number]  Time of notification:  1938  Critical value read back:Yes.    Nurse who received alert:  JRM  MD notified (1st page):  EDP  Time of first page:  1938  MD notified (2nd page):  Time of second page:  Responding MD:  EDP  Time MD responded:  [phone]

## 2015-04-21 NOTE — ED Notes (Signed)
Pt has recently has a stent placed in her gallbladder last Monday. On Friday pt had a back surgery on Friday. Pt is now hurting in her epigastric area. After surgery pts pain got better then over the weekend her pain got worse and she has been unable to eat or drink.

## 2015-04-21 NOTE — ED Notes (Signed)
Patient ambulatory to restroom with family member 

## 2015-04-22 ENCOUNTER — Inpatient Hospital Stay (HOSPITAL_COMMUNITY): Payer: Medicare Other

## 2015-04-22 ENCOUNTER — Encounter (INDEPENDENT_AMBULATORY_CARE_PROVIDER_SITE_OTHER): Payer: Self-pay | Admitting: Internal Medicine

## 2015-04-22 DIAGNOSIS — R1011 Right upper quadrant pain: Secondary | ICD-10-CM

## 2015-04-22 DIAGNOSIS — M545 Low back pain: Secondary | ICD-10-CM

## 2015-04-22 DIAGNOSIS — H04123 Dry eye syndrome of bilateral lacrimal glands: Secondary | ICD-10-CM

## 2015-04-22 DIAGNOSIS — R112 Nausea with vomiting, unspecified: Secondary | ICD-10-CM

## 2015-04-22 LAB — COMPREHENSIVE METABOLIC PANEL
ALT: 15 U/L (ref 14–54)
AST: 22 U/L (ref 15–41)
Albumin: 2.9 g/dL — ABNORMAL LOW (ref 3.5–5.0)
Alkaline Phosphatase: 118 U/L (ref 38–126)
Anion gap: 7 (ref 5–15)
BUN: 8 mg/dL (ref 6–20)
CO2: 31 mmol/L (ref 22–32)
Calcium: 8.5 mg/dL — ABNORMAL LOW (ref 8.9–10.3)
Chloride: 102 mmol/L (ref 101–111)
Creatinine, Ser: 0.34 mg/dL — ABNORMAL LOW (ref 0.44–1.00)
GFR calc Af Amer: >60 mL/min (ref >60)
GFR calc non Af Amer: >60 mL/min (ref >60)
Glucose, Bld: 103 mg/dL — ABNORMAL HIGH (ref 65–99)
Potassium: 3.6 mmol/L (ref 3.5–5.1)
Sodium: 140 mmol/L (ref 135–145)
Total Bilirubin: 0.5 mg/dL (ref 0.3–1.2)
Total Protein: 5.6 g/dL — ABNORMAL LOW (ref 6.5–8.1)

## 2015-04-22 LAB — CBC
HCT: 29.9 % — ABNORMAL LOW (ref 36.0–46.0)
Hemoglobin: 9.7 g/dL — ABNORMAL LOW (ref 12.0–15.0)
MCH: 26.6 pg (ref 26.0–34.0)
MCHC: 32.4 g/dL (ref 30.0–36.0)
MCV: 82.1 fL (ref 78.0–100.0)
Platelets: 280 10*3/uL (ref 150–400)
RBC: 3.64 MIL/uL — ABNORMAL LOW (ref 3.87–5.11)
RDW: 14.5 % (ref 11.5–15.5)
WBC: 5.6 10*3/uL (ref 4.0–10.5)

## 2015-04-22 LAB — PROTIME-INR
INR: 1.13 (ref 0.00–1.49)
Prothrombin Time: 14.7 seconds (ref 11.6–15.2)

## 2015-04-22 MED ORDER — DEXTROSE 5 % IV SOLN
1.0000 g | INTRAVENOUS | Status: DC
  Administered 2015-04-22 – 2015-04-24 (×3): 1 g via INTRAVENOUS
  Administered 2015-04-25: 2 g via INTRAVENOUS
  Filled 2015-04-22 (×5): qty 10

## 2015-04-22 MED ORDER — ONDANSETRON HCL 4 MG/2ML IJ SOLN
4.0000 mg | Freq: Four times a day (QID) | INTRAMUSCULAR | Status: DC | PRN
  Administered 2015-04-22 – 2015-04-25 (×4): 4 mg via INTRAVENOUS
  Filled 2015-04-22 (×4): qty 2

## 2015-04-22 MED ORDER — METHOCARBAMOL 1000 MG/10ML IJ SOLN
500.0000 mg | Freq: Three times a day (TID) | INTRAVENOUS | Status: DC | PRN
  Administered 2015-04-26: 500 mg via INTRAVENOUS
  Filled 2015-04-22: qty 5

## 2015-04-22 MED ORDER — ENOXAPARIN SODIUM 40 MG/0.4ML ~~LOC~~ SOLN
40.0000 mg | SUBCUTANEOUS | Status: DC
  Administered 2015-04-22: 40 mg via SUBCUTANEOUS

## 2015-04-22 MED ORDER — SODIUM CHLORIDE 0.9 % IV BOLUS (SEPSIS)
1500.0000 mL | Freq: Once | INTRAVENOUS | Status: AC
  Administered 2015-04-22: 1500 mL via INTRAVENOUS

## 2015-04-22 MED ORDER — ACETAMINOPHEN 650 MG RE SUPP: 650.0000 mg | Freq: Four times a day (QID) | RECTAL | Status: DC | PRN

## 2015-04-22 MED ORDER — MORPHINE SULFATE (PF) 2 MG/ML IV SOLN: 2.0000 mg | INTRAVENOUS | Status: DC | PRN

## 2015-04-22 MED ORDER — HYDROMORPHONE HCL 1 MG/ML IJ SOLN
0.5000 mg | INTRAMUSCULAR | Status: AC | PRN
  Administered 2015-04-22 (×2): 0.5 mg via INTRAVENOUS
  Filled 2015-04-22 (×2): qty 1

## 2015-04-22 MED ORDER — ONDANSETRON HCL 4 MG PO TABS: 4.0000 mg | ORAL_TABLET | Freq: Four times a day (QID) | ORAL | Status: DC | PRN

## 2015-04-22 MED ORDER — POTASSIUM CHLORIDE IN NACL 40-0.9 MEQ/L-% IV SOLN
INTRAVENOUS | Status: DC
  Administered 2015-04-22: 125 mL/h via INTRAVENOUS

## 2015-04-22 MED ORDER — METOPROLOL TARTRATE 12.5 MG HALF TABLET
12.5000 mg | ORAL_TABLET | Freq: Two times a day (BID) | ORAL | Status: DC
  Administered 2015-04-22 – 2015-04-26 (×7): 12.5 mg via ORAL
  Filled 2015-04-22 (×10): qty 1

## 2015-04-22 MED ORDER — MORPHINE SULFATE (PF) 4 MG/ML IV SOLN
3.0000 mg | INTRAVENOUS | Status: DC | PRN
  Administered 2015-04-22 – 2015-04-25 (×12): 3 mg via INTRAVENOUS
  Filled 2015-04-22 (×15): qty 1

## 2015-04-22 MED ORDER — ONDANSETRON HCL 4 MG/2ML IJ SOLN
4.0000 mg | Freq: Three times a day (TID) | INTRAMUSCULAR | Status: AC | PRN
  Filled 2015-04-22: qty 2

## 2015-04-22 MED ORDER — PANTOPRAZOLE SODIUM 40 MG PO TBEC
40.0000 mg | DELAYED_RELEASE_TABLET | Freq: Every day | ORAL | Status: DC
  Administered 2015-04-22 – 2015-04-26 (×5): 40 mg via ORAL
  Filled 2015-04-22 (×5): qty 1

## 2015-04-22 MED ORDER — POLYVINYL ALCOHOL 1.4 % OP SOLN
1.0000 [drp] | OPHTHALMIC | Status: DC | PRN
  Administered 2015-04-22: 1 [drp] via OPHTHALMIC
  Filled 2015-04-22: qty 15

## 2015-04-22 MED ORDER — SODIUM CHLORIDE 0.9 % IV SOLN: INTRAVENOUS | Status: AC

## 2015-04-22 MED ORDER — ACETAMINOPHEN 325 MG PO TABS: 650.0000 mg | ORAL_TABLET | Freq: Four times a day (QID) | ORAL | Status: DC | PRN

## 2015-04-22 NOTE — Consult Note (Signed)
Referring Provider: Barton Dubois, MD Primary Care Physician:  Purvis Kilts, MD Primary Gastroenterologist:  Dr. Laural Golden  Reason for Consultation:    Abdominal pain. Patient with known biliary tract disease and recent ERCP with stenting.  HPI:   Patient is 75 year old Caucasian female was admitted to hospitalist service yesterday while emergent so where she presented with relapse of upper abdominal pain associated with nausea and anorexia. She did not experience vomiting fever chills shortness of breath or chest pain. She continues to complain of back pain radiating to her left hip and left leg. She had kyphoplasty to T6 vertebra 4 days ago. Patient was admitted to this facility on 04/12/2015 with upper abdominal pain nausea and vomiting of acute onset. Imaging studies revealed distended gallbladder calcified gallstone and markedly dilated biliary system. She had mildly elevated transaminases and alkaline phosphatase. She underwent ERCP on 04/14/2015 revealing markedly dilated biliary system with distal tapering. Gallbladder was not visualized. No stones are identified. 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 Sphincterotomy was performed and stent was left in place to establish biliary drainage. Acute symptoms resolved and she was discharged the following day. She had kyphoplasty on 04/18/2015 as planned. According to her daughter she was given Percocet but it made her sick. She has not been able to eat much. This evening she is free of abdominal pain but complains of back pain. Pain is worse when she moves around. She tells me her husband is at [facility] advanced lung carcinoma and not doing well.          Past Medical History  Diagnosis Date  . Compression fracture 07/24/09    T12; kyphoplasty  . Atrial fibrillation ([place]) 2011    Postop, spontaneous conversion to normal sinus after one hour  . Tobacco abuse   . History of echocardiogram 5/11    EF 65%    Past Surgical History   Procedure Laterality Date  . Back surgery    . Ercp N/A 04/14/2015    Procedure: ENDOSCOPIC RETROGRADE CHOLANGIOPANCREATOGRAPHY (ERCP) Biliary Sphincterotomy, 10x7 stent placement Dilated bilary system just not well seen;  Surgeon: Rogene Houston, MD;  Location: [facility];  Service: Endoscopy;  Laterality: N/A;    Prior to Admission medications   Medication Sig Start Date End Date Taking? Authorizing Provider  aspirin EC 81 MG tablet Take 81 mg by mouth daily.   Yes Historical Provider, MD  methimazole (TAPAZOLE) 5 MG tablet Take 1 tablet (5 mg total) by mouth daily. 04/15/15  Yes Coralie Common, MD  metoprolol tartrate (LOPRESSOR) 25 MG tablet Take 0.5 tablets (12.5 mg total) by mouth 2 (two) times daily. 04/15/15  Yes Coralie Common, MD  oxyCODONE-acetaminophen (PERCOCET/ROXICET) 5-325 MG tablet Take 1 tablet by mouth every 6 (six) hours as needed. Patient taking differently: Take 1 tablet by mouth every 6 (six) hours as needed for moderate pain or severe pain.  04/07/15  Yes Lily Kocher, PA-C    Current Facility-Administered Medications  Medication Dose Route Frequency Provider Last Rate Last Dose  . 0.9 %  sodium chloride infusion   Intravenous Continuous Francine Graven, DO 75 mL/hr at 04/21/15 2107    . 0.9 %  sodium chloride infusion   Intravenous STAT Francine Graven, DO      . 0.9 % NaCl with KCl 40 mEq / L  infusion   Intravenous Continuous Roney Jaffe, MD 125 mL/hr at 04/22/15 0330 125 mL/hr at 04/22/15 0330  . acetaminophen (TYLENOL) tablet 650 mg  650 mg Oral  Q6H PRN Roney Jaffe, MD       Or  . acetaminophen (TYLENOL) suppository 650 mg  650 mg Rectal Q6H PRN Roney Jaffe, MD      . cefTRIAXone (ROCEPHIN) 1 g in dextrose 5 % 50 mL IVPB  1 g Intravenous Q24H Barton Dubois, MD   1 g at 04/22/15 1043  . enoxaparin (LOVENOX) injection 40 mg  40 mg Subcutaneous Q24H Roney Jaffe, MD   40 mg at 04/22/15 0127  . methocarbamol (ROBAXIN) 500 mg in dextrose 5 % 50 mL IVPB   500 mg Intravenous Q8H PRN Barton Dubois, MD      . metoprolol tartrate (LOPRESSOR) tablet 12.5 mg  12.5 mg Oral BID Barton Dubois, MD   12.5 mg at 04/22/15 1044  . morphine 4 MG/ML injection 3 mg  3 mg Intravenous Q3H PRN Barton Dubois, MD      . ondansetron [facility]) tablet 4 mg  4 mg Oral Q6H PRN Roney Jaffe, MD       Or  . ondansetron [facility]) injection 4 mg  4 mg Intravenous Q6H PRN Roney Jaffe, MD   4 mg at 04/22/15 0023  . pantoprazole (PROTONIX) EC tablet 40 mg  40 mg Oral Daily Barton Dubois, MD   40 mg at 04/22/15 1054  . polyvinyl alcohol (LIQUIFILM TEARS) 1.4 % ophthalmic solution 1 drop  1 drop Both Eyes PRN Barton Dubois, MD   1 drop at 04/22/15 1043    Allergies as of 04/21/2015  . (No Known Allergies)    Family History  Problem Relation Age of Onset  . Heart attack Father   . Stroke Father   . Breast cancer Sister     Social History   Social History  . Marital Status: Married    Spouse Name: N/A  . Number of Children: 2  . Years of Education: N/A   Occupational History  . Not on file.   Social History Main Topics  . Smoking status: Former Smoker -- 0.15 packs/day for 20 years  . Smokeless tobacco: Not on file  . Alcohol Use: No  . Drug Use: No  . Sexual Activity: Not Currently   Other Topics Concern  . Not on file   Social History Narrative   Active in gardens and does yard work.    Review of Systems: See HPI, otherwise normal ROS  Physical Exam: Temp:  [98.3 F (36.8 C)-98.9 F (37.2 C)] 98.3 F (36.8 C) (02/14 1414) Pulse Rate:  [67-113] 102 (02/14 1414) Resp:  [14-21] 18 (02/14 1414) BP: (109-148)/(43-78) 135/56 mmHg (02/14 1414) SpO2:  [92 %-98 %] 94 % (02/14 1740) Weight:  [114 lb (51.71 kg)-117 lb (53.071 kg)] 114 lb (51.71 kg) (02/14 1551) Last BM Date: 04/20/15   Patient is alert and in no acute distress. She has bilateral exopthalmos. Conjunctiva is pink sclerae nonicteric. Oropharyngeal mucosa is dry otherwise  unremarkable. No neck masses or thyromegaly noted. Cardiac exam with regular rhythm normal S1 and S2. Faint systolic ejection murmur noted at left sternal border. Lungs are clear to auscultation. Abdomen is symmetrical. Bowel sounds are normal. On palpation abdomen is soft with mild tenderness below the right costal margin or guarding. No rebound. No organomegaly or masses. Peripheral edema or clubbing noted. Lab Results:  Recent Labs  04/21/15 1851 04/22/15 0525  WBC 6.4 5.6  HGB 10.9* 9.7*  HCT 33.3* 29.9*  PLT 310 280   BMET  Recent Labs  04/21/15 1851 04/22/15 0525  NA 137  140  K 2.7* 3.6  CL 96* 102  CO2 34* 31  GLUCOSE 105* 103*  BUN 10 8  CREATININE <0.30* 0.34*  CALCIUM 9.2 8.5*   LFT  Recent Labs  04/22/15 0525  PROT 5.6*  ALBUMIN 2.9*  AST 22  ALT 15  ALKPHOS 118  BILITOT 0.5   PT/INR  Recent Labs  04/22/15 0525  LABPROT 14.7  INR 1.13   Hepatitis Panel No results for input(s): HEPBSAG, HCVAB, HEPAIGM, HEPBIGM in the last 72 hours.  Studies/Results: Mr Thoracic Spine Wo Contrast  04/22/2015  CLINICAL DATA:  Back pain, prior back surgery. EXAM: MRI THORACIC SPINE WITHOUT CONTRAST TECHNIQUE: Multiplanar, multisequence MR imaging of the thoracic spine was performed. No intravenous contrast was administered. COMPARISON:  04/07/2015 FINDINGS: The patient was also scheduled for a lumbar spine MRI but refused lumbar imaging. Despite efforts by the technologist and patient, motion artifact is present on today's exam and could not be eliminated. This reduces exam sensitivity and specificity. No significant abnormal spinal cord signal is observed. There has been interval kyphoplasty at the T6 vertebral level, with low signal attack relate in the vertebral body. There is edema in the vertebral body, not unexpected this soon after kyphoplasty, and also possibly related to the original fracture. There is only perhaps 2 mm of posterior bony retropulsion, unchanged  from the CT from 04/07/2015. Trace amount of methacrylate and in the left paraspinal soft tissues, tracking between the T8 vertebral body and the descending aorta. Remote vertebral augmentation at T12, without further collapse and without vertebral edema at this level. No other vertebral edema is noted in the thoracic spine. Additional findings at individual levels are as follows: T1-2:  Unremarkable. T2-3:  Unremarkable. T3-4:  Unremarkable. T4-5:  Unremarkable. T5-6:  Unremarkable. T6-7: Mild right foraminal stenosis due to slight bony retropulsion along the anterior margin of the right neural foramen, image 5 series 3. T7-8:  Unremarkable. T8-9:  Unremarkable. T9-10:  Unremarkable. T10-11:  No impingement.  Mild disc bulge. T11-12: No impingement. Mild bilateral facet arthropathy with mild posterior spurring and somewhat broad central disc protrusion. T12-L1: Unremarkable. IMPRESSION: 1. Interval kyphoplasty at T6, without any further loss of vertebral body height. There is edema in the vertebral body due to the recent compression and possibly related also to the kyphoplasty. Small amount of left anterior paraspinal extension of methacrylate between the T8 vertebral body and the descending thoracic aorta. 2. Mild right foraminal stenosis at C6-7 due to slight bony retropulsion related to the prior fracture. This appears unchanged from the preprocedural CT scan performed on 04/07/2015. 3. Prior vertebral augmentation at T12 without significant change. Electronically Signed   By: Van Clines M.D.   On: 04/22/2015 16:53   Ct Abdomen Pelvis W Contrast  04/21/2015  CLINICAL DATA:  Recent gallbladder stent last Monday. Back surgery on Friday. Now with pain in the epigastric area. Unable to eat or drink. EXAM: CT ABDOMEN AND PELVIS WITH CONTRAST TECHNIQUE: Multidetector CT imaging of the abdomen and pelvis was performed using the standard protocol following bolus administration of intravenous contrast.  CONTRAST:  175mL OMNIPAQUE IOHEXOL 300 MG/ML  SOLN COMPARISON:  04/12/2015 FINDINGS: Mild dependent changes in the lung bases. Minimal pericardial effusion. Interval placement of a biliary stent. There is some decompression of the bile ducts post stent placement compared with previous study. Pneumobilia consistent with instrumentation. There is a stone in the gallbladder. Mild gallbladder wall thickening. No infiltration in the fat around the gallbladder. No focal liver  lesions identified. The pancreas, spleen, adrenal glands, abdominal aorta, inferior vena cava, and retroperitoneal lymph nodes are unremarkable. Bilateral renal cysts, largest on the left measuring 3.1 cm diameter. No hydronephrosis. Stomach, small bowel, and colon are not abnormally distended. Stool fills the colon. Contrast material is present in the terminal ileum suggesting no evidence of bowel obstruction. No discrete wall thickening is appreciated. No free air or free fluid in the abdomen. Abdominal wall musculature appears intact. Pelvis: The appendix is normal. Uterus and ovaries are not enlarged. Bladder wall is not thickened. No free or loculated pelvic fluid collections. No pelvic mass or lymphadenopathy. Degenerative changes in the spine and shoulders. Compression fracture of L4 demonstrates mild progression since previous study. Old compression of T12 post kyphoplasty. IMPRESSION: Interval placement of a biliary stent with decompression of the bile ducts. Pneumobilia is consistent with instrumentation. Cholelithiasis with small stone in the gallbladder. Gallbladder wall thickening is nonspecific but could indicate inflammatory change in the appropriate clinical setting. Minimal pericardial effusion. Compression fracture of L2 vertebra demonstrates mild progression since previous study. Electronically Signed   By: Lucienne Capers M.D.   On: 04/21/2015 21:51   I reviewed current and prior abdominopelvic CT with Dr. Lavonia Dana earlier  today.   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 She now returns with recurrent abdominal pain and diminished intake. She has distended gallbladder with calcified stone and recurrent wall which is new since last CT of 8 days ago. She does not have any evidence of pancreatitis and her transaminases and lipase are normal. Suspect she has acute cholecystitis or empyema of gallbladder and would benefit from cholecystectomy. I do not believe HIDA scan would be very helpful and possibly would be false negative because of presence of biliary stent. Patient is on ceftriaxone. Patient would prefer to be transferred to [facility] for cholecystectomy. Back pain secondary to compression fracture of thoracic vertebra. Status post kyphoplasty 4 days ago.  Anemia. She was evaluated during recent admission. Her stool was guaiac negative. Serum B12 and folate levels are normal. Serum ferritin was normal but serum iron and TIBC were low suggestive of anemia of chronic disease.  Hypothyroidism. Patient will benefit from endocrinology consultation. Since she has received iodinated contrast on 2 occasions within the last 8 or 9 days further evaluation for left to be delayed for 6-8 weeks.   Recommendations;  Surgical consultation for cholecystectomy. Patient requests to be transferred to [facility] which I will try to arrange. She will also need endocrinology consultation regarding hyperthyroidism. She will return for ERCP and stent removal in 8 weeks or so.   LOS: 1 day   Jaz Laningham U  04/22/2015, 8:36 PM

## 2015-04-22 NOTE — Progress Notes (Signed)
[facility] PROGRESS NOTE  Courtney Arnold [id number] DOB: December 04, 1940 DOA: 04/21/2015 PCP: Purvis Kilts, MD  Assessment/Plan: 1-abd pain, nausea and vomiting: patient with pain in her RUQ, worse with food and with associated nausea and vomiting. No fever, normal WBC's, normal LFT's and lipase. -after discussing with GI, as she recently have ERCP and stent placement; there is increase in her gallbladder walls distension in comparison to recent images and also there is some small gallstones appreciated. He recommends to have general surgery evaluating her for possible needs of cholecystectomy. 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 Will follow general surgery and GI rec's.   Consultants:  GI  General surgery   Procedures:  See below for x-ray reports   Antibiotics:  Rocephin 2/14  HPI/Subjective: Afebrile, no nausea currently. Complaining of exquisite lower back pain and RUQ pain. Also with complaints of dry eyes.  Objective: Filed Vitals:   04/22/15 0004 04/22/15 0428  BP: 118/43 148/58  Pulse:  113  Temp:  98.9 F (37.2 C)  Resp: 20 20    Intake/Output Summary (Last 24 hours) at 04/22/15 0942 Last data filed at 04/22/15 0800  Gross per 24  hour  Intake 1418.75 ml  Output      0 ml  Net 1418.75 ml   Filed Weights   04/21/15 1557 04/22/15 0004 04/22/15 0428  Weight: 53.071 kg (117 lb) 53.071 kg (117 lb) 51.982 kg (114 lb 9.6 oz)    Exam:   General:  Afebrile, currently w/o nausea and asking to have at least something to drink. Patient with complaints of RUQ pain and also lower back pain (last one excruciating and interfering with movement).  Cardiovascular: S1 and S2, mild tachycardia, positive SEM, no rubs  Respiratory: no wheezing, no crackles; good air movement   Abdomen: RUQ pain, mild guarding, positive BS  Musculoskeletal: no edema, no cyanosis or clubbing    Data Reviewed: Basic Metabolic Panel:  Recent Labs Lab 04/21/15 1851 04/22/15 0525  NA 137 140  K 2.7* 3.6  CL 96* 102  CO2 34* 31  GLUCOSE 105* 103*  BUN 10 8  CREATININE <0.30* 0.34*  CALCIUM 9.2 8.5*   Liver Function Tests:  Recent Labs Lab 04/21/15 1851 04/22/15 0525  AST 28 22  ALT 18 15  ALKPHOS 153* 118  BILITOT 0.6 0.5  PROT 6.5 5.6*  ALBUMIN 3.5 2.9*    Recent Labs Lab 04/21/15 1851  LIPASE 13   CBC:  Recent Labs Lab 04/21/15 1851 04/22/15 0525  WBC 6.4 5.6  HGB 10.9* 9.7*  HCT 33.3* 29.9*  MCV 80.6 82.1  PLT 310 280   CBG: No results for input(s): GLUCAP in the last 168 hours.  Recent Results (from the past 240 hour(s))  MRSA PCR Screening     Status: None  Collection Time: 04/14/15  3:00 AM  Result Value Ref Range Status   MRSA by PCR NEGATIVE NEGATIVE Final    Comment:        The GeneXpert MRSA Assay (FDA approved for NASAL specimens only), is one component of a comprehensive MRSA colonization surveillance program. It is not intended to diagnose MRSA infection nor to guide or monitor treatment for MRSA infections.      Studies: Ct Abdomen Pelvis W Contrast  04/21/2015  CLINICAL DATA:  Recent gallbladder stent last Monday. Back surgery on Friday. Now with pain in the epigastric area.  Unable to eat or drink. EXAM: CT ABDOMEN AND PELVIS WITH CONTRAST TECHNIQUE: Multidetector CT imaging of the abdomen and pelvis was performed using the standard protocol following bolus administration of intravenous contrast. CONTRAST:  163mL OMNIPAQUE IOHEXOL 300 MG/ML  SOLN COMPARISON:  04/12/2015 FINDINGS: Mild dependent changes in the lung bases. Minimal pericardial effusion. Interval placement of a biliary stent. There is some decompression of the bile ducts post stent placement compared with previous study. Pneumobilia consistent with instrumentation. There is a stone in the gallbladder. Mild gallbladder wall thickening. No infiltration in the fat around the gallbladder. No focal liver lesions identified. The pancreas, spleen, adrenal glands, abdominal aorta, inferior vena cava, and retroperitoneal lymph nodes are unremarkable. Bilateral renal cysts, largest on the left measuring 3.1 cm diameter. No hydronephrosis. Stomach, small bowel, and colon are not abnormally distended. Stool fills the colon. Contrast material is present in the terminal ileum suggesting no evidence of bowel obstruction. No discrete wall thickening is appreciated. No free air or free fluid in the abdomen. Abdominal wall musculature appears intact. Pelvis: The appendix is normal. Uterus and ovaries are not enlarged. Bladder wall is not thickened. No free or loculated pelvic fluid collections. No pelvic mass or lymphadenopathy. Degenerative changes in the spine and shoulders. Compression fracture of L4 demonstrates mild progression since previous study. Old compression of T12 post kyphoplasty. IMPRESSION: Interval placement of a biliary stent with decompression of the bile ducts. Pneumobilia is consistent with instrumentation. Cholelithiasis with small stone in the gallbladder. Gallbladder wall thickening is nonspecific but could indicate inflammatory change in the appropriate clinical setting. Minimal pericardial effusion. Compression  fracture of L2 vertebra demonstrates mild progression since previous study. Electronically Signed   By: Lucienne Capers M.D.   On: 04/21/2015 21:51    Scheduled Meds: . sodium chloride   Intravenous STAT  . cefTRIAXone (ROCEPHIN)  IV  1 g Intravenous Q24H  . enoxaparin (LOVENOX) injection  40 mg Subcutaneous Q24H  . metoprolol tartrate  12.5 mg Oral BID   Continuous Infusions: . sodium chloride 75 mL/hr at 04/21/15 2107  . 0.9 % NaCl with KCl 40 mEq / L 125 mL/hr (04/22/15 0330)    Active Problems:   Abdominal pain   Hyperthyroidism   Nausea & vomiting   History of biliary stent insertion   Volume depletion   Hypokalemia    Time spent: 30 minutes    Barton Dubois  [facility] Pager [phone]. If 7PM-7AM, please contact night-coverage at [email or web address], password [facility] 04/22/2015, 9:42 AM  LOS: 1 day

## 2015-04-22 NOTE — Care Management Note (Signed)
Case Management Note  Patient Details  Name: Courtney Arnold MRN: [id number] Date of Birth: 22-Sep-1940  Subjective/Objective:                  Pt admitted with abd pain. 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 Pt says she uses no DME prior to admission, has PD services in the home to care for her husband but they also provide assistance to her if she needs to. Pt plans to return home with self care. Pt not interested in any [facility] services or DME at this time.   Action/Plan: No CM needs anticipated.   Expected Discharge Date:  04/25/15               Expected Discharge Plan:  Home/Self Care  In-House Referral:  NA  Discharge planning Services  CM Consult  Post Acute Care Choice:  NA Choice offered to:  NA  DME Arranged:    DME Agency:     HH Arranged:    HH Agency:     Status of Service:  Completed, signed off  Medicare Important Message Given:    Date Medicare IM Given:    Medicare IM give by:    Date Additional Medicare IM Given:    Additional Medicare Important Message give by:     If discussed at [place] of Stay Meetings, dates discussed:    Additional Comments:  Sherald Barge, RN 04/22/2015, 11:09 AM

## 2015-04-23 LAB — CBC
HCT: 29.5 % — ABNORMAL LOW (ref 36.0–46.0)
Hemoglobin: 9.5 g/dL — ABNORMAL LOW (ref 12.0–15.0)
MCH: 26.5 pg (ref 26.0–34.0)
MCHC: 32.2 g/dL (ref 30.0–36.0)
MCV: 82.2 fL (ref 78.0–100.0)
Platelets: 283 10*3/uL (ref 150–400)
RBC: 3.59 MIL/uL — ABNORMAL LOW (ref 3.87–5.11)
RDW: 14.5 % (ref 11.5–15.5)
WBC: 5.1 10*3/uL (ref 4.0–10.5)

## 2015-04-23 LAB — COMPREHENSIVE METABOLIC PANEL
ALT: 15 U/L (ref 14–54)
AST: 19 U/L (ref 15–41)
Albumin: 3.2 g/dL — ABNORMAL LOW (ref 3.5–5.0)
Alkaline Phosphatase: 136 U/L — ABNORMAL HIGH (ref 38–126)
Anion gap: 9 (ref 5–15)
BUN: 6 mg/dL (ref 6–20)
CO2: 31 mmol/L (ref 22–32)
Calcium: 9 mg/dL (ref 8.9–10.3)
Chloride: 99 mmol/L — ABNORMAL LOW (ref 101–111)
Creatinine, Ser: <0.3 mg/dL — ABNORMAL LOW (ref 0.44–1.00)
Glucose, Bld: 109 mg/dL — ABNORMAL HIGH (ref 65–99)
Potassium: 3.8 mmol/L (ref 3.5–5.1)
Sodium: 139 mmol/L (ref 135–145)
Total Bilirubin: 0.4 mg/dL (ref 0.3–1.2)
Total Protein: 6.2 g/dL — ABNORMAL LOW (ref 6.5–8.1)

## 2015-04-23 MED ORDER — LIDOCAINE 5 % EX PTCH
1.0000 | MEDICATED_PATCH | CUTANEOUS | Status: DC
  Administered 2015-04-23 – 2015-04-26 (×4): 1 via TRANSDERMAL
  Filled 2015-04-23 (×5): qty 1

## 2015-04-23 MED ORDER — OXYCODONE HCL 5 MG PO TABS
5.0000 mg | ORAL_TABLET | Freq: Four times a day (QID) | ORAL | Status: DC | PRN
  Administered 2015-04-23 – 2015-04-26 (×6): 5 mg via ORAL
  Filled 2015-04-23 (×6): qty 1

## 2015-04-23 NOTE — Progress Notes (Addendum)
[facility] PROGRESS NOTE  Courtney Arnold [id number] DOB: May 09, 1940 DOA: 04/21/2015 PCP: Purvis Kilts, MD  Assessment/Plan: 1-abd pain, nausea and vomiting: patient with pain in her RUQ, worse with food and with associated nausea and vomiting. No fever, normal WBC's, normal LFT's and lipase. -S/p ERCP stent placement on 04/14/2015; CT scan of Abd/pelvis showing increase gallbladder wall distension in comparison associated with small gallstones.  -Dr Arnoldo Morale of General Surgery consulted. I spoke with Dr Arnoldo Morale on 04/23/2015 who did not feel she required emergent cholecystectomy. OK to advance diet from his prospective.  -She remains on Ceftriaxone. -Patient requesting transfer to [facility].   2-lower back pain: patient with recent hyphoplasty -She complains of ongoing pain -MRI thoracic and Lumbar region performed on 04/22/2015 showed interval kyphoplasty at T6 without further loss of body height.  - She is on as needed morphine and robaxin. Symptoms uncontrolled, will add lidocaine patch 12 hours on 12 hours off and PRN oxycodone.   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  -Am labs showing K of 3.8 after replacement.   6-dehydration:  -Advancing diet. Encourage PO intake  7-GERD: will use protonix  Addendum Case discussed with Dr Laural Golden of GI who stated discussing case with Dr Carlean Purl and Dr Ninfa Linden at [facility]. He recommended transfer to [facility] to be evaluated for cholecystectomy. Patient to be transferred to medicine service with general surgery consultation. Case was signed out to Dr Posey Pronto.   Code Status: Full code Family Communication: daughter Lynelle Smoke (By phone; number written on whiteboard inside patient's room) Disposition Plan: Transfer to [facility]    Consultants:  GI  General surgery   Procedures:  See below for x-ray reports   Antibiotics:  Rocephin 2/14  HPI/Subjective: She reports ongoing back and abdominal pain. Denies fevers/chills/N/V  Objective: Filed Vitals:   04/22/15 2305 04/23/15 0527  BP: 120/60 118/62  Pulse: 100   Temp: 98 F (36.7 C) 98.1 F (36.7 C)  Resp: 17     Intake/Output Summary (Last 24 hours) at 04/23/15 0742 Last data filed at 04/22/15 1700  Gross per 24 hour  Intake    240 ml  Output      0 ml  Net    240 ml   Filed Weights   04/22/15 0004 04/22/15 0428 04/23/15 0527  Weight: 53.071 kg (117 lb) 51.982 kg (114 lb 9.6 oz) 51.03 kg (112 lb 8 oz)    Exam:   General:  Afebrile, she is in no acute distress, complains of back and abd pain  Cardiovascular: S1 and S2, mild tachycardia, positive SEM, no rubs  Respiratory: no wheezing, no crackles; good air movement   Abdomen: Their is some pain to palpation to RUQ, no guarding  Musculoskeletal: no edema, no cyanosis or clubbing    Data Reviewed: Basic Metabolic Panel:  Recent Labs Lab 04/21/15 1851 04/22/15 0525  NA 137 140  K 2.7* 3.6  CL 96* 102  CO2 34* 31  GLUCOSE 105* 103*  BUN 10 8  CREATININE <0.30* 0.34*  CALCIUM 9.2 8.5*   Liver Function Tests:  Recent Labs Lab 04/21/15 1851 04/22/15 0525  AST 28 22  ALT 18 15  ALKPHOS 153* 118  BILITOT 0.6 0.5  PROT 6.5 5.6*  ALBUMIN 3.5 2.9*  Recent Labs Lab 04/21/15 1851  LIPASE 13   CBC:  Recent Labs Lab 04/21/15 1851 04/22/15 0525 04/23/15 0556  WBC 6.4 5.6 5.1  HGB 10.9* 9.7* 9.5*  HCT 33.3* 29.9* 29.5*  MCV 80.6 82.1 82.2  PLT 310 280 283   CBG: No results for input(s): GLUCAP in the last 168 hours.  Recent Results (from the past 240 hour(s))  MRSA PCR Screening     Status: None   Collection Time: 04/14/15  3:00 AM  Result Value Ref Range Status   MRSA by PCR NEGATIVE NEGATIVE Final    Comment:        The GeneXpert MRSA  Assay (FDA approved for NASAL specimens only), is one component of a comprehensive MRSA colonization surveillance program. It is not intended to diagnose MRSA infection nor to guide or monitor treatment for MRSA infections.      Studies: Mr Thoracic Spine Wo Contrast  04/22/2015  CLINICAL DATA:  Back pain, prior back surgery. EXAM: MRI THORACIC SPINE WITHOUT CONTRAST TECHNIQUE: Multiplanar, multisequence MR imaging of the thoracic spine was performed. No intravenous contrast was administered. COMPARISON:  04/07/2015 FINDINGS: The patient was also scheduled for a lumbar spine MRI but refused lumbar imaging. Despite efforts by the technologist and patient, motion artifact is present on today's exam and could not be eliminated. This reduces exam sensitivity and specificity. No significant abnormal spinal cord signal is observed. There has been interval kyphoplasty at the T6 vertebral level, with low signal attack relate in the vertebral body. There is edema in the vertebral body, not unexpected this soon after kyphoplasty, and also possibly related to the original fracture. There is only perhaps 2 mm of posterior bony retropulsion, unchanged from the CT from 04/07/2015. Trace amount of methacrylate and in the left paraspinal soft tissues, tracking between the T8 vertebral body and the descending aorta. Remote vertebral augmentation at T12, without further collapse and without vertebral edema at this level. No other vertebral edema is noted in the thoracic spine. Additional findings at individual levels are as follows: T1-2:  Unremarkable. T2-3:  Unremarkable. T3-4:  Unremarkable. T4-5:  Unremarkable. T5-6:  Unremarkable. T6-7: Mild right foraminal stenosis due to slight bony retropulsion along the anterior margin of the right neural foramen, image 5 series 3. T7-8:  Unremarkable. T8-9:  Unremarkable. T9-10:  Unremarkable. T10-11:  No impingement.  Mild disc bulge. T11-12: No impingement. Mild bilateral  facet arthropathy with mild posterior spurring and somewhat broad central disc protrusion. T12-L1: Unremarkable. IMPRESSION: 1. Interval kyphoplasty at T6, without any further loss of vertebral body height. There is edema in the vertebral body due to the recent compression and possibly related also to the kyphoplasty. Small amount of left anterior paraspinal extension of methacrylate between the T8 vertebral body and the descending thoracic aorta. 2. Mild right foraminal stenosis at C6-7 due to slight bony retropulsion related to the prior fracture. This appears unchanged from the preprocedural CT scan performed on 04/07/2015. 3. Prior vertebral augmentation at T12 without significant change. Electronically Signed   By: Van Clines M.D.   On: 04/22/2015 16:53   Ct Abdomen Pelvis W Contrast  04/21/2015  CLINICAL DATA:  Recent gallbladder stent last Monday. Back surgery on Friday. Now with pain in the epigastric area. Unable to eat or drink. EXAM: CT ABDOMEN AND PELVIS WITH CONTRAST TECHNIQUE: Multidetector CT imaging of the abdomen and pelvis was performed using the standard protocol following bolus administration of intravenous contrast. CONTRAST:  133mL OMNIPAQUE IOHEXOL 300 MG/ML  SOLN COMPARISON:  04/12/2015 FINDINGS: Mild dependent changes in the lung bases. Minimal pericardial effusion. Interval placement of a biliary stent. There is some decompression of the bile ducts post stent placement compared with previous study. Pneumobilia consistent with instrumentation. There is a stone in the gallbladder. Mild gallbladder wall thickening. No infiltration in the fat around the gallbladder. No focal liver lesions identified. The pancreas, spleen, adrenal glands, abdominal aorta, inferior vena cava, and retroperitoneal lymph nodes are unremarkable. Bilateral renal cysts, largest on the left measuring 3.1 cm diameter. No hydronephrosis. Stomach, small bowel, and colon are not abnormally distended. Stool fills  the colon. Contrast material is present in the terminal ileum suggesting no evidence of bowel obstruction. No discrete wall thickening is appreciated. No free air or free fluid in the abdomen. Abdominal wall musculature appears intact. Pelvis: The appendix is normal. Uterus and ovaries are not enlarged. Bladder wall is not thickened. No free or loculated pelvic fluid collections. No pelvic mass or lymphadenopathy. Degenerative changes in the spine and shoulders. Compression fracture of L4 demonstrates mild progression since previous study. Old compression of T12 post kyphoplasty. IMPRESSION: Interval placement of a biliary stent with decompression of the bile ducts. Pneumobilia is consistent with instrumentation. Cholelithiasis with small stone in the gallbladder. Gallbladder wall thickening is nonspecific but could indicate inflammatory change in the appropriate clinical setting. Minimal pericardial effusion. Compression fracture of L2 vertebra demonstrates mild progression since previous study. Electronically Signed   By: Lucienne Capers M.D.   On: 04/21/2015 21:51    Scheduled Meds: . cefTRIAXone (ROCEPHIN)  IV  1 g Intravenous Q24H  . metoprolol tartrate  12.5 mg Oral BID  . pantoprazole  40 mg Oral Daily   Continuous Infusions: . sodium chloride 75 mL/hr at 04/21/15 2107  . 0.9 % NaCl with KCl 40 mEq / L 125 mL/hr (04/22/15 0330)    Active Problems:   Abdominal pain   Hyperthyroidism   Nausea & vomiting   History of biliary stent insertion   Volume depletion   Hypokalemia    Time spent: 30 minutes    Kelvin Cellar  [facility] Pager [phone]. If 7PM-7AM, please contact night-coverage at [email or web address], password [facility] 04/23/2015, 7:42 AM  LOS: 2 days

## 2015-04-23 NOTE — Progress Notes (Signed)
Discussed patient's condition with Dr. Nedra Hai last evening. Received call from Dr. Peterson [place] office. Patient will be transferred to [facility]. She will be on medical service and Dr. Georgiana Spinner can be contacted when patient arrives there.

## 2015-04-23 NOTE — Consult Note (Signed)
Reason for Consult: Abdominal pain Referring Physician: Hospitalist  Courtney Arnold is an 75 y.o. female.  HPI: Patient is a 75 year old white female who underwent ERCP on 04/14/2015 for right upper quadrant abdominal pain. She did have a dilated biliary system and a sphincterotomy and stent were placed. She was noted at that time to have a dilated gallbladder with cholelithiasis. She was discharged home and underwent a kyphoplasty on 04/18/2015. This was for compression fracture of the lumbar spine. She presented back to the emergency room with abdominal pain and nausea. Her pain seems to come and go. Currently she is complaining of flank pain and nonspecific abdominal pain. Her appetite is decreased.  Past Medical History  Diagnosis Date  . Compression fracture 07/24/09    T12; kyphoplasty  . Atrial fibrillation ([place]) 2011    Postop, spontaneous conversion to normal sinus after one hour  . Tobacco abuse   . History of echocardiogram 5/11    EF 65%    Past Surgical History  Procedure Laterality Date  . Back surgery    . Ercp N/A 04/14/2015    Procedure: ENDOSCOPIC RETROGRADE CHOLANGIOPANCREATOGRAPHY (ERCP) Biliary Sphincterotomy, 10x7 stent placement Dilated bilary system just not well seen;  Surgeon: Rogene Houston, MD;  Location: [facility];  Service: Endoscopy;  Laterality: N/A;    Family History  Problem Relation Age of Onset  . Heart attack Father   . Stroke Father   . Breast cancer Sister     Social History:  reports that she has quit smoking. She does not have any smokeless tobacco history on file. She reports that she does not drink alcohol or use illicit drugs.  Allergies: No Known Allergies  Medications: I have reviewed the patient's current medications.  Results for orders placed or performed during the hospital encounter of 04/21/15 (from the past 48 hour(s))  Lipase, blood     Status: None   Collection Time: 04/21/15  6:51 PM  Result Value Ref Range   Lipase 13 11 -  51 U/L  Comprehensive metabolic panel     Status: Abnormal   Collection Time: 04/21/15  6:51 PM  Result Value Ref Range   Sodium 137 135 - 145 mmol/L   Potassium 2.7 (LL) 3.5 - 5.1 mmol/L    Comment: CRITICAL RESULT CALLED TO, READ BACK BY AND VERIFIED WITH: MIZE,J AT 1945 ON 04/21/15 BY ISLEY,B    Chloride 96 (L) 101 - 111 mmol/L   CO2 34 (H) 22 - 32 mmol/L   Glucose, Bld 105 (H) 65 - 99 mg/dL   BUN 10 6 - 20 mg/dL   Creatinine, Ser <0.30 (L) 0.44 - 1.00 mg/dL   Calcium 9.2 8.9 - 10.3 mg/dL   Total Protein 6.5 6.5 - 8.1 g/dL   Albumin 3.5 3.5 - 5.0 g/dL   AST 28 15 - 41 U/L   ALT 18 14 - 54 U/L   Alkaline Phosphatase 153 (H) 38 - 126 U/L   Total Bilirubin 0.6 0.3 - 1.2 mg/dL   GFR calc non Af Amer NOT CALCULATED >60 mL/min   GFR calc Af Amer NOT CALCULATED >60 mL/min    Comment: (NOTE) The eGFR has been calculated using the CKD EPI equation. This calculation has not been validated in all clinical situations. eGFR's persistently <60 mL/min signify possible Chronic Kidney Disease.    Anion gap 7 5 - 15  CBC     Status: Abnormal   Collection Time: 04/21/15  6:51 PM  Result Value  Ref Range   WBC 6.4 4.0 - 10.5 K/uL   RBC 4.13 3.87 - 5.11 MIL/uL   Hemoglobin 10.9 (L) 12.0 - 15.0 g/dL   HCT 33.3 (L) 36.0 - 46.0 %   MCV 80.6 78.0 - 100.0 fL   MCH 26.4 26.0 - 34.0 pg   MCHC 32.7 30.0 - 36.0 g/dL   RDW 14.3 11.5 - 15.5 %   Platelets 310 150 - 400 K/uL  I-Stat CG4 Lactic Acid, ED     Status: None   Collection Time: 04/21/15  7:00 PM  Result Value Ref Range   Lactic Acid, Venous 0.71 0.5 - 2.0 mmol/L  Urinalysis, Routine w reflex microscopic (not at [facility])     Status: Abnormal   Collection Time: 04/21/15  9:55 PM  Result Value Ref Range   Color, Urine YELLOW YELLOW   APPearance HAZY (A) CLEAR   Specific Gravity, Urine 1.010 1.005 - 1.030   pH 7.0 5.0 - 8.0   Glucose, UA NEGATIVE NEGATIVE mg/dL   Hgb urine dipstick NEGATIVE NEGATIVE   Bilirubin Urine NEGATIVE NEGATIVE    Ketones, ur NEGATIVE NEGATIVE mg/dL   Protein, ur NEGATIVE NEGATIVE mg/dL   Nitrite NEGATIVE NEGATIVE   Leukocytes, UA NEGATIVE NEGATIVE    Comment: MICROSCOPIC NOT DONE ON URINES WITH NEGATIVE PROTEIN, BLOOD, LEUKOCYTES, NITRITE, OR GLUCOSE <1000 mg/dL.  Comprehensive metabolic panel     Status: Abnormal   Collection Time: 04/22/15  5:25 AM  Result Value Ref Range   Sodium 140 135 - 145 mmol/L   Potassium 3.6 3.5 - 5.1 mmol/L    Comment: DELTA CHECK NOTED   Chloride 102 101 - 111 mmol/L   CO2 31 22 - 32 mmol/L   Glucose, Bld 103 (H) 65 - 99 mg/dL   BUN 8 6 - 20 mg/dL   Creatinine, Ser 0.34 (L) 0.44 - 1.00 mg/dL   Calcium 8.5 (L) 8.9 - 10.3 mg/dL   Total Protein 5.6 (L) 6.5 - 8.1 g/dL   Albumin 2.9 (L) 3.5 - 5.0 g/dL   AST 22 15 - 41 U/L   ALT 15 14 - 54 U/L   Alkaline Phosphatase 118 38 - 126 U/L   Total Bilirubin 0.5 0.3 - 1.2 mg/dL   GFR calc non Af Amer >60 >60 mL/min   GFR calc Af Amer >60 >60 mL/min    Comment: (NOTE) The eGFR has been calculated using the CKD EPI equation. This calculation has not been validated in all clinical situations. eGFR's persistently <60 mL/min signify possible Chronic Kidney Disease.    Anion gap 7 5 - 15  CBC     Status: Abnormal   Collection Time: 04/22/15  5:25 AM  Result Value Ref Range   WBC 5.6 4.0 - 10.5 K/uL   RBC 3.64 (L) 3.87 - 5.11 MIL/uL   Hemoglobin 9.7 (L) 12.0 - 15.0 g/dL   HCT 29.9 (L) 36.0 - 46.0 %   MCV 82.1 78.0 - 100.0 fL   MCH 26.6 26.0 - 34.0 pg   MCHC 32.4 30.0 - 36.0 g/dL   RDW 14.5 11.5 - 15.5 %   Platelets 280 150 - 400 K/uL  Protime-INR     Status: None   Collection Time: 04/22/15  5:25 AM  Result Value Ref Range   Prothrombin Time 14.7 11.6 - 15.2 seconds   INR 1.13 0.00 - 1.49    Mr Thoracic Spine Wo Contrast  04/22/2015  CLINICAL DATA:  Back pain, prior back surgery. EXAM: MRI  THORACIC SPINE WITHOUT CONTRAST TECHNIQUE: Multiplanar, multisequence MR imaging of the thoracic spine was performed. No  intravenous contrast was administered. COMPARISON:  04/07/2015 FINDINGS: The patient was also scheduled for a lumbar spine MRI but refused lumbar imaging. Despite efforts by the technologist and patient, motion artifact is present on today's exam and could not be eliminated. This reduces exam sensitivity and specificity. No significant abnormal spinal cord signal is observed. There has been interval kyphoplasty at the T6 vertebral level, with low signal attack relate in the vertebral body. There is edema in the vertebral body, not unexpected this soon after kyphoplasty, and also possibly related to the original fracture. There is only perhaps 2 mm of posterior bony retropulsion, unchanged from the CT from 04/07/2015. Trace amount of methacrylate and in the left paraspinal soft tissues, tracking between the T8 vertebral body and the descending aorta. Remote vertebral augmentation at T12, without further collapse and without vertebral edema at this level. No other vertebral edema is noted in the thoracic spine. Additional findings at individual levels are as follows: T1-2:  Unremarkable. T2-3:  Unremarkable. T3-4:  Unremarkable. T4-5:  Unremarkable. T5-6:  Unremarkable. T6-7: Mild right foraminal stenosis due to slight bony retropulsion along the anterior margin of the right neural foramen, image 5 series 3. T7-8:  Unremarkable. T8-9:  Unremarkable. T9-10:  Unremarkable. T10-11:  No impingement.  Mild disc bulge. T11-12: No impingement. Mild bilateral facet arthropathy with mild posterior spurring and somewhat broad central disc protrusion. T12-L1: Unremarkable. IMPRESSION: 1. Interval kyphoplasty at T6, without any further loss of vertebral body height. There is edema in the vertebral body due to the recent compression and possibly related also to the kyphoplasty. Small amount of left anterior paraspinal extension of methacrylate between the T8 vertebral body and the descending thoracic aorta. 2. Mild right foraminal  stenosis at C6-7 due to slight bony retropulsion related to the prior fracture. This appears unchanged from the preprocedural CT scan performed on 04/07/2015. 3. Prior vertebral augmentation at T12 without significant change. Electronically Signed   By: Van Clines M.D.   On: 04/22/2015 16:53   Ct Abdomen Pelvis W Contrast  04/21/2015  CLINICAL DATA:  Recent gallbladder stent last Monday. Back surgery on Friday. Now with pain in the epigastric area. Unable to eat or drink. EXAM: CT ABDOMEN AND PELVIS WITH CONTRAST TECHNIQUE: Multidetector CT imaging of the abdomen and pelvis was performed using the standard protocol following bolus administration of intravenous contrast. CONTRAST:  146m OMNIPAQUE IOHEXOL 300 MG/ML  SOLN COMPARISON:  04/12/2015 FINDINGS: Mild dependent changes in the lung bases. Minimal pericardial effusion. Interval placement of a biliary stent. There is some decompression of the bile ducts post stent placement compared with previous study. Pneumobilia consistent with instrumentation. There is a stone in the gallbladder. Mild gallbladder wall thickening. No infiltration in the fat around the gallbladder. No focal liver lesions identified. The pancreas, spleen, adrenal glands, abdominal aorta, inferior vena cava, and retroperitoneal lymph nodes are unremarkable. Bilateral renal cysts, largest on the left measuring 3.1 cm diameter. No hydronephrosis. Stomach, small bowel, and colon are not abnormally distended. Stool fills the colon. Contrast material is present in the terminal ileum suggesting no evidence of bowel obstruction. No discrete wall thickening is appreciated. No free air or free fluid in the abdomen. Abdominal wall musculature appears intact. Pelvis: The appendix is normal. Uterus and ovaries are not enlarged. Bladder wall is not thickened. No free or loculated pelvic fluid collections. No pelvic mass or lymphadenopathy. Degenerative changes  in the spine and shoulders.  Compression fracture of L4 demonstrates mild progression since previous study. Old compression of T12 post kyphoplasty. IMPRESSION: Interval placement of a biliary stent with decompression of the bile ducts. Pneumobilia is consistent with instrumentation. Cholelithiasis with small stone in the gallbladder. Gallbladder wall thickening is nonspecific but could indicate inflammatory change in the appropriate clinical setting. Minimal pericardial effusion. Compression fracture of L2 vertebra demonstrates mild progression since previous study. Electronically Signed   By: Lucienne Capers M.D.   On: 04/21/2015 21:51    ROS: See chart Blood pressure 118/62, pulse 100, temperature 98.1 F (36.7 C), temperature source Oral, resp. rate 17, height _0  (1.575 m), weight 51.03 kg (112 lb 8 oz), SpO2 95 %. Physical Exam: Elderly white female who is sitting up on the edge of the bed. Abdomen has migratory, intermittent pain to palpation. No specific point tenderness in the right upper quadrant is noted. No rigidity is noted.  Assessment/Plan: Impression: Abdominal pain which I suspect is multifactorial nature. She does not have laboratory evidence of acute cholecystitis. Her back issues are exacerbating any abdominal pain she is having. I did offer her a cholecystectomy, which she would prefer to have this done in [place]. I told her that a laparoscopic cholecystectomy may not fully alleviate her abdominal pain. Will discuss this with Dr. Laural Golden.  Juma Oxley A 04/23/2015, 7:22 AM

## 2015-04-23 NOTE — Progress Notes (Signed)
  Subjective:  Patient states she was able to enjoy her lunch but she did have midabdominal pain and pain in the right costal margin without nausea or vomiting. She is having less back pain. She states she's been walking to the bathroom. She is very worried about her husband who is at [facility] not doing well.   Objective: Blood pressure 118/62, pulse 100, temperature 98.1 F (36.7 C), temperature source Oral, resp. rate 17, height 5\' 2"  (1.575 m), weight 112 lb 8 oz (51.03 kg), SpO2 94 %.  patient is alert. She appears to be comfortable. She is sitting in a recliner. She has bilateral exopthalmos with  Hyperemic right conjunctiva. Abdomen is full and soft. She has mild generalized tenderness which is most pronounced below the right costal margin where she guards some. No LE edema or clubbing noted.  Labs/studies Results:   Recent Labs  04/21/15 1851 04/22/15 0525 04/23/15 0556  WBC 6.4 5.6 5.1  HGB 10.9* 9.7* 9.5*  HCT 33.3* 29.9* 29.5*  PLT 310 280 283    BMET   Recent Labs  04/21/15 1851 04/22/15 0525 04/23/15 0556  NA 137 140 139  K 2.7* 3.6 3.8  CL 96* 102 99*  CO2 34* 31 31  GLUCOSE 105* 103* 109*  BUN 10 8 6   CREATININE <0.30* 0.34* <0.30*  CALCIUM 9.2 8.5* 9.0    LFT   Recent Labs  04/21/15 1851 04/22/15 0525 04/23/15 0556  PROT 6.5 5.6* 6.2*  ALBUMIN 3.5 2.9* 3.2*  AST 28 22 19   ALT 18 15 15   ALKPHOS 153* 118 136*  BILITOT 0.6 0.5 0.4      Assessment:  #1. Right upper quadrant abdominal pain appears to be secondary to cholecystitis. She has cholelithiasis. Patient has requested to be transferred to [facility] and she is waiting for bed. Patient remains on ceftriaxone IV. #2.  Papillary stenosis. Patient underwent ERCP with sphincterotomy and biliary stenting last week. Transaminases remain normal.  slight bump in alkaline phosphatase may be related to vertebral fracture. Biliary stent appears to be in good position based on CT of 2 days ago.  She will  undergo repeat ERCP in 4-6 weeks. #3. Anemia. Anemia appears to be due to chronic disease. No evidence of GI bleed. #4.  Hyperthyroidism. She is on beta blocker for symptomatic therapy. Outpatient endocrinology consultation planned.

## 2015-04-24 LAB — CBC
HCT: 29.3 % — ABNORMAL LOW (ref 36.0–46.0)
Hemoglobin: 9.5 g/dL — ABNORMAL LOW (ref 12.0–15.0)
MCH: 26.8 pg (ref 26.0–34.0)
MCHC: 32.4 g/dL (ref 30.0–36.0)
MCV: 82.5 fL (ref 78.0–100.0)
Platelets: 250 10*3/uL (ref 150–400)
RBC: 3.55 MIL/uL — ABNORMAL LOW (ref 3.87–5.11)
RDW: 14.5 % (ref 11.5–15.5)
WBC: 5.5 10*3/uL (ref 4.0–10.5)

## 2015-04-24 LAB — COMPREHENSIVE METABOLIC PANEL
ALT: 13 U/L — ABNORMAL LOW (ref 14–54)
AST: 15 U/L (ref 15–41)
Albumin: 3.1 g/dL — ABNORMAL LOW (ref 3.5–5.0)
Alkaline Phosphatase: 127 U/L — ABNORMAL HIGH (ref 38–126)
Anion gap: 7 (ref 5–15)
BUN: 10 mg/dL (ref 6–20)
CO2: 34 mmol/L — ABNORMAL HIGH (ref 22–32)
Calcium: 8.8 mg/dL — ABNORMAL LOW (ref 8.9–10.3)
Chloride: 98 mmol/L — ABNORMAL LOW (ref 101–111)
Creatinine, Ser: 0.36 mg/dL — ABNORMAL LOW (ref 0.44–1.00)
GFR calc Af Amer: >60 mL/min (ref >60)
GFR calc non Af Amer: >60 mL/min (ref >60)
Glucose, Bld: 110 mg/dL — ABNORMAL HIGH (ref 65–99)
Potassium: 3.7 mmol/L (ref 3.5–5.1)
Sodium: 139 mmol/L (ref 135–145)
Total Bilirubin: 0.4 mg/dL (ref 0.3–1.2)
Total Protein: 5.9 g/dL — ABNORMAL LOW (ref 6.5–8.1)

## 2015-04-24 MED ORDER — BISACODYL 10 MG RE SUPP
10.0000 mg | Freq: Once | RECTAL | Status: DC
  Filled 2015-04-24: qty 1

## 2015-04-24 MED ORDER — SODIUM CHLORIDE 0.9 % IV SOLN
INTRAVENOUS | Status: DC
  Administered 2015-04-24 – 2015-04-25 (×3): via INTRAVENOUS

## 2015-04-24 MED ORDER — POLYETHYLENE GLYCOL 3350 17 G PO PACK
17.0000 g | PACK | Freq: Every day | ORAL | Status: DC
  Administered 2015-04-26: 17 g via ORAL
  Filled 2015-04-24 (×2): qty 1

## 2015-04-24 NOTE — Progress Notes (Signed)
[facility] PROGRESS NOTE  SANIAA HOSCHAR [id number] DOB: 07-08-1940 DOA: 04/21/2015 PCP: Purvis Kilts, MD  Assessment/Plan: 1-Abd pain, nausea and vomiting: patient with pain in her RUQ, worse with food and with associated nausea and vomiting. No fever, normal WBC's, normal LFT's and lipase. -S/p ERCP stent placement on 04/14/2015; CT scan of Abd/pelvis showing increase gallbladder wall distension in comparison associated with small gallstones.  -Dr Arnoldo Morale of General Surgery consulted. I spoke with Dr Arnoldo Morale on 04/23/2015 who did not feel Courtney Arnold required emergent cholecystectomy. OK to advance diet from his prospective.  -Patient requesting transfer to [facility]. Dr Laural Golden spoke with Dr Ninfa Linden of general surgery who agreed to see pt in consultation.  -On 04/23/2015 I placed transfer orders to [facility], case discussed with Dr. Posey Pronto who agreed to accept patient in transfer to a MedSurg bed. We are currently awaiting bed availability.  2-Lower back pain: patient with recent hyphoplasty -Courtney Arnold complains of ongoing pain -MRI thoracic and Lumbar region performed on 04/22/2015 showed interval kyphoplasty at T6 without further loss of body height.  - Courtney Arnold is on as needed morphine and robaxin. -Courtney Arnold reports symptoms improved with lidocaine patch.   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  -Am labs showing K of 3.8 after replacement.   6-dehydration:  -Advancing diet. Encourage PO intake  7-GERD: will use protonix   Code Status: Full code Family Communication: daughter Lynelle Smoke (By phone; number written on whiteboard inside patient's room) Disposition Plan: Transfer to [facility]   Consultants:  GI  General surgery   Procedures:  See below for x-ray  reports   Antibiotics:  Rocephin 2/14  HPI/Subjective: Courtney Arnold complains of ongoing abdominal pain located in the right upper quadrant and epigastric region, states back pain improved today.  Objective: Filed Vitals:   04/23/15 2044 04/24/15 0826  BP: 123/47 117/53  Pulse: 103 93  Temp: 98.8 F (37.1 C) 98.7 F (37.1 C)  Resp: 17 18   No intake or output data in the 24 hours ending 04/24/15 0938 Filed Weights   04/22/15 0428 04/23/15 0527 04/24/15 0600  Weight: 51.982 kg (114 lb 9.6 oz) 51.03 kg (112 lb 8 oz) 50 kg (110 lb 3.7 oz)    Exam:   General:  Afebrile, Courtney Arnold is in no acute distress, complains of ongoing abdominal pain  Cardiovascular: S1 and S2, mild tachycardia, positive SEM, no rubs  Respiratory: no wheezing, no crackles; good air movement   Abdomen: Their is some pain to palpation to RUQ, no guarding  Musculoskeletal: no edema, no cyanosis or clubbing    Data Reviewed: Basic Metabolic Panel:  Recent Labs Lab 04/21/15 1851 04/22/15 0525 04/23/15 0556 04/24/15 0544  NA 137 140 139 139  K 2.7* 3.6 3.8 3.7  CL 96* 102 99* 98*  CO2 34* 31 31 34*  GLUCOSE 105* 103* 109* 110*  BUN 10 8 6 10   CREATININE <0.30* 0.34* <0.30* 0.36*  CALCIUM 9.2 8.5* 9.0 8.8*   Liver Function Tests:  Recent Labs Lab 04/21/15 1851 04/22/15 0525 04/23/15 0556 04/24/15 0544  AST 28 22 19 15   ALT 18 15 15  13*  ALKPHOS 153* 118 136* 127*  BILITOT 0.6 0.5 0.4 0.4  PROT 6.5 5.6* 6.2* 5.9*  ALBUMIN 3.5 2.9* 3.2* 3.1*    Recent  Labs Lab 04/21/15 1851  LIPASE 13   CBC:  Recent Labs Lab 04/21/15 1851 04/22/15 0525 04/23/15 0556 04/24/15 0544  WBC 6.4 5.6 5.1 5.5  HGB 10.9* 9.7* 9.5* 9.5*  HCT 33.3* 29.9* 29.5* 29.3*  MCV 80.6 82.1 82.2 82.5  PLT 310 280 283 250   CBG: No results for input(s): GLUCAP in the last 168 hours.  No results found for this or any previous visit (from the past 240 hour(s)).   Studies: Mr Thoracic Spine Wo Contrast  04/22/2015   CLINICAL DATA:  Back pain, prior back surgery. EXAM: MRI THORACIC SPINE WITHOUT CONTRAST TECHNIQUE: Multiplanar, multisequence MR imaging of the thoracic spine was performed. No intravenous contrast was administered. COMPARISON:  04/07/2015 FINDINGS: The patient was also scheduled for a lumbar spine MRI but refused lumbar imaging. Despite efforts by the technologist and patient, motion artifact is present on today's exam and could not be eliminated. This reduces exam sensitivity and specificity. No significant abnormal spinal cord signal is observed. There has been interval kyphoplasty at the T6 vertebral level, with low signal attack relate in the vertebral body. There is edema in the vertebral body, not unexpected this soon after kyphoplasty, and also possibly related to the original fracture. There is only perhaps 2 mm of posterior bony retropulsion, unchanged from the CT from 04/07/2015. Trace amount of methacrylate and in the left paraspinal soft tissues, tracking between the T8 vertebral body and the descending aorta. Remote vertebral augmentation at T12, without further collapse and without vertebral edema at this level. No other vertebral edema is noted in the thoracic spine. Additional findings at individual levels are as follows: T1-2:  Unremarkable. T2-3:  Unremarkable. T3-4:  Unremarkable. T4-5:  Unremarkable. T5-6:  Unremarkable. T6-7: Mild right foraminal stenosis due to slight bony retropulsion along the anterior margin of the right neural foramen, image 5 series 3. T7-8:  Unremarkable. T8-9:  Unremarkable. T9-10:  Unremarkable. T10-11:  No impingement.  Mild disc bulge. T11-12: No impingement. Mild bilateral facet arthropathy with mild posterior spurring and somewhat broad central disc protrusion. T12-L1: Unremarkable. IMPRESSION: 1. Interval kyphoplasty at T6, without any further loss of vertebral body height. There is edema in the vertebral body due to the recent compression and possibly related  also to the kyphoplasty. Small amount of left anterior paraspinal extension of methacrylate between the T8 vertebral body and the descending thoracic aorta. 2. Mild right foraminal stenosis at C6-7 due to slight bony retropulsion related to the prior fracture. This appears unchanged from the preprocedural CT scan performed on 04/07/2015. 3. Prior vertebral augmentation at T12 without significant change. Electronically Signed   By: Van Clines M.D.   On: 04/22/2015 16:53    Scheduled Meds: . cefTRIAXone (ROCEPHIN)  IV  1 g Intravenous Q24H  . lidocaine  1 patch Transdermal Q24H  . metoprolol tartrate  12.5 mg Oral BID  . pantoprazole  40 mg Oral Daily   Continuous Infusions:    Active Problems:   Abdominal pain   Hyperthyroidism   Nausea & vomiting   History of biliary stent insertion   Volume depletion   Hypokalemia    Time spent: 15 minutes    Kelvin Cellar  [facility] Pager [phone]. If 7PM-7AM, please contact night-coverage at [email or web address], password [facility] 04/24/2015, 9:38 AM  LOS: 3 days

## 2015-04-25 ENCOUNTER — Encounter (HOSPITAL_COMMUNITY): Payer: Self-pay

## 2015-04-25 ENCOUNTER — Inpatient Hospital Stay (HOSPITAL_COMMUNITY): Payer: Medicare Other | Admitting: Certified Registered Nurse Anesthetist

## 2015-04-25 ENCOUNTER — Inpatient Hospital Stay (HOSPITAL_COMMUNITY): Payer: Medicare Other

## 2015-04-25 ENCOUNTER — Encounter (HOSPITAL_COMMUNITY)
Admission: EM | Disposition: A | Discharge status: Home / Self Care | Payer: Self-pay | Source: Home / Self Care | Attending: Internal Medicine

## 2015-04-25 DIAGNOSIS — D649 Anemia, unspecified: Secondary | ICD-10-CM | POA: Diagnosis present

## 2015-04-25 DIAGNOSIS — M545 Low back pain: Secondary | ICD-10-CM | POA: Insufficient documentation

## 2015-04-25 DIAGNOSIS — K810 Acute cholecystitis: Secondary | ICD-10-CM | POA: Diagnosis present

## 2015-04-25 HISTORY — PX: CHOLECYSTECTOMY: SHX55

## 2015-04-25 HISTORY — DX: Acute cholecystitis: K81.0

## 2015-04-25 LAB — SURGICAL PCR SCREEN
MRSA, PCR: NEGATIVE
Staphylococcus aureus: NEGATIVE

## 2015-04-25 SURGERY — LAPAROSCOPIC CHOLECYSTECTOMY WITH INTRAOPERATIVE CHOLANGIOGRAM
Anesthesia: General | Site: Abdomen

## 2015-04-25 MED ORDER — LIDOCAINE HCL (CARDIAC) 20 MG/ML IV SOLN
INTRAVENOUS | Status: AC
  Filled 2015-04-25: qty 5

## 2015-04-25 MED ORDER — EPHEDRINE SULFATE 50 MG/ML IJ SOLN
INTRAMUSCULAR | Status: AC
  Filled 2015-04-25: qty 1

## 2015-04-25 MED ORDER — SODIUM CHLORIDE 0.9 % IJ SOLN
INTRAMUSCULAR | Status: AC
  Filled 2015-04-25: qty 10

## 2015-04-25 MED ORDER — ROCURONIUM BROMIDE 100 MG/10ML IV SOLN
INTRAVENOUS | Status: DC | PRN
  Administered 2015-04-25: 10 mg via INTRAVENOUS
  Administered 2015-04-25: 30 mg via INTRAVENOUS

## 2015-04-25 MED ORDER — MEPERIDINE HCL 50 MG/ML IJ SOLN: 6.2500 mg | INTRAMUSCULAR | Status: DC | PRN

## 2015-04-25 MED ORDER — DEXAMETHASONE SODIUM PHOSPHATE 10 MG/ML IJ SOLN
INTRAMUSCULAR | Status: AC
  Filled 2015-04-25: qty 1

## 2015-04-25 MED ORDER — SUGAMMADEX SODIUM 200 MG/2ML IV SOLN
INTRAVENOUS | Status: DC | PRN
  Administered 2015-04-25: 100 mg via INTRAVENOUS

## 2015-04-25 MED ORDER — FENTANYL CITRATE (PF) 100 MCG/2ML IJ SOLN
INTRAMUSCULAR | Status: AC
  Filled 2015-04-25: qty 2

## 2015-04-25 MED ORDER — BUPIVACAINE-EPINEPHRINE 0.25% -1:200000 IJ SOLN
INTRAMUSCULAR | Status: DC | PRN
  Administered 2015-04-25: 20 mL

## 2015-04-25 MED ORDER — DEXTROSE 5 % IV SOLN
INTRAVENOUS | Status: AC
  Filled 2015-04-25: qty 2

## 2015-04-25 MED ORDER — FENTANYL CITRATE (PF) 250 MCG/5ML IJ SOLN
INTRAMUSCULAR | Status: AC
  Filled 2015-04-25: qty 5

## 2015-04-25 MED ORDER — PROPOFOL 10 MG/ML IV BOLUS
INTRAVENOUS | Status: DC | PRN
  Administered 2015-04-25: 130 mg via INTRAVENOUS

## 2015-04-25 MED ORDER — BUPIVACAINE-EPINEPHRINE (PF) 0.25% -1:200000 IJ SOLN
INTRAMUSCULAR | Status: AC
  Filled 2015-04-25: qty 30

## 2015-04-25 MED ORDER — 0.9 % SODIUM CHLORIDE (POUR BTL) OPTIME
TOPICAL | Status: DC | PRN
Start: 2015-04-25 — End: 2015-04-25
  Administered 2015-04-25: 1000 mL

## 2015-04-25 MED ORDER — EPHEDRINE SULFATE 50 MG/ML IJ SOLN
INTRAMUSCULAR | Status: DC | PRN
  Administered 2015-04-25: 10 mg via INTRAVENOUS

## 2015-04-25 MED ORDER — FENTANYL CITRATE (PF) 100 MCG/2ML IJ SOLN
INTRAMUSCULAR | Status: DC | PRN
  Administered 2015-04-25 (×3): 50 ug via INTRAVENOUS
  Administered 2015-04-25: 100 ug via INTRAVENOUS

## 2015-04-25 MED ORDER — IOHEXOL 300 MG/ML  SOLN
INTRAMUSCULAR | Status: DC | PRN
  Administered 2015-04-25: 4.5 mL

## 2015-04-25 MED ORDER — DEXAMETHASONE SODIUM PHOSPHATE 10 MG/ML IJ SOLN
INTRAMUSCULAR | Status: DC | PRN
  Administered 2015-04-25: 10 mg via INTRAVENOUS

## 2015-04-25 MED ORDER — ONDANSETRON HCL 4 MG/2ML IJ SOLN
INTRAMUSCULAR | Status: AC
  Filled 2015-04-25: qty 2

## 2015-04-25 MED ORDER — PHENYLEPHRINE HCL 10 MG/ML IJ SOLN
INTRAMUSCULAR | Status: DC | PRN
  Administered 2015-04-25: 80 ug via INTRAVENOUS

## 2015-04-25 MED ORDER — PIPERACILLIN-TAZOBACTAM 3.375 G IVPB
3.3750 g | Freq: Three times a day (TID) | INTRAVENOUS | Status: DC
  Administered 2015-04-25 – 2015-04-26 (×3): 3.375 g via INTRAVENOUS
  Filled 2015-04-25 (×4): qty 50

## 2015-04-25 MED ORDER — METOPROLOL TARTRATE 1 MG/ML IV SOLN
INTRAVENOUS | Status: AC
  Filled 2015-04-25: qty 5

## 2015-04-25 MED ORDER — PROMETHAZINE HCL 25 MG/ML IJ SOLN: 6.2500 mg | INTRAMUSCULAR | Status: DC | PRN

## 2015-04-25 MED ORDER — LACTATED RINGERS IR SOLN
Status: DC | PRN
Start: 2015-04-25 — End: 2015-04-25
  Administered 2015-04-25: 1000 mL

## 2015-04-25 MED ORDER — ONDANSETRON HCL 4 MG/2ML IJ SOLN
INTRAMUSCULAR | Status: DC | PRN
  Administered 2015-04-25: 4 mg via INTRAVENOUS

## 2015-04-25 MED ORDER — LIDOCAINE HCL (CARDIAC) 20 MG/ML IV SOLN
INTRAVENOUS | Status: DC | PRN
  Administered 2015-04-25: 100 mg via INTRAVENOUS

## 2015-04-25 MED ORDER — SUGAMMADEX SODIUM 200 MG/2ML IV SOLN
INTRAVENOUS | Status: AC
  Filled 2015-04-25: qty 2

## 2015-04-25 MED ORDER — PHENYLEPHRINE 40 MCG/ML (10ML) SYRINGE FOR IV PUSH (FOR BLOOD PRESSURE SUPPORT)
PREFILLED_SYRINGE | INTRAVENOUS | Status: AC
  Filled 2015-04-25: qty 10

## 2015-04-25 MED ORDER — PROPOFOL 10 MG/ML IV BOLUS
INTRAVENOUS | Status: AC
  Filled 2015-04-25: qty 20

## 2015-04-25 MED ORDER — LACTATED RINGERS IV SOLN
INTRAVENOUS | Status: DC
  Administered 2015-04-25: 1000 mL via INTRAVENOUS

## 2015-04-25 MED ORDER — LIP MEDEX EX OINT
TOPICAL_OINTMENT | CUTANEOUS | Status: DC | PRN
  Filled 2015-04-25: qty 7

## 2015-04-25 MED ORDER — ROCURONIUM BROMIDE 100 MG/10ML IV SOLN
INTRAVENOUS | Status: AC
  Filled 2015-04-25: qty 1

## 2015-04-25 MED ORDER — FENTANYL CITRATE (PF) 100 MCG/2ML IJ SOLN
25.0000 ug | INTRAMUSCULAR | Status: DC | PRN
  Administered 2015-04-25 (×2): 25 ug via INTRAVENOUS
  Administered 2015-04-25: 50 ug via INTRAVENOUS

## 2015-04-25 MED ORDER — SUCCINYLCHOLINE CHLORIDE 20 MG/ML IJ SOLN
INTRAMUSCULAR | Status: DC | PRN
  Administered 2015-04-25: 100 mg via INTRAVENOUS

## 2015-04-25 MED ORDER — LACTATED RINGERS IV SOLN
INTRAVENOUS | Status: DC
  Administered 2015-04-25: 13:00:00 via INTRAVENOUS

## 2015-04-25 MED ORDER — METOPROLOL TARTRATE 1 MG/ML IV SOLN
INTRAVENOUS | Status: DC | PRN
  Administered 2015-04-25 (×2): 2.5 mg via INTRAVENOUS

## 2015-04-25 SURGICAL SUPPLY — 44 items
APPLIER CLIP 5 13 M/L LIGAMAX5 (MISCELLANEOUS) ×3
APPLIER CLIP ROT 10 11.4 M/L (STAPLE)
CABLE HIGH FREQUENCY MONO STRZ (ELECTRODE) ×3 IMPLANT
CATH CHOLANG 76X19 KUMAR (CATHETERS) IMPLANT
CHLORAPREP W/TINT 26ML (MISCELLANEOUS) ×3 IMPLANT
CLIP APPLIE 5 13 M/L LIGAMAX5 (MISCELLANEOUS) ×1 IMPLANT
CLIP APPLIE ROT 10 11.4 M/L (STAPLE) IMPLANT
CLIP LIGATING HEM O LOK PURPLE (MISCELLANEOUS) ×3 IMPLANT
COVER MAYO STAND STRL (DRAPES) ×3 IMPLANT
COVER SURGICAL LIGHT HANDLE (MISCELLANEOUS) ×3 IMPLANT
CUTTER FLEX LINEAR 45M (STAPLE) IMPLANT
DECANTER SPIKE VIAL GLASS SM (MISCELLANEOUS) ×3 IMPLANT
DRAIN CHANNEL 19F RND (DRAIN) IMPLANT
DRAPE C-ARM 42X120 X-RAY (DRAPES) ×3 IMPLANT
DRAPE LAPAROSCOPIC ABDOMINAL (DRAPES) ×3 IMPLANT
ELECT REM PT RETURN 9FT ADLT (ELECTROSURGICAL) ×3
ELECTRODE REM PT RTRN 9FT ADLT (ELECTROSURGICAL) ×1 IMPLANT
EVACUATOR SILICONE 100CC (DRAIN) IMPLANT
GLOVE BIOGEL PI IND STRL 7.0 (GLOVE) ×1 IMPLANT
GLOVE BIOGEL PI INDICATOR 7.0 (GLOVE) ×2
GLOVE SURG SS PI 7.0 STRL IVOR (GLOVE) ×3 IMPLANT
GOWN STRL REUS W/TWL LRG LVL3 (GOWN DISPOSABLE) ×3 IMPLANT
GOWN STRL REUS W/TWL XL LVL3 (GOWN DISPOSABLE) ×6 IMPLANT
KIT BASIN OR (CUSTOM PROCEDURE TRAY) ×3 IMPLANT
LIQUID BAND (GAUZE/BANDAGES/DRESSINGS) ×3 IMPLANT
MARKER SKIN DUAL TIP RULER LAB (MISCELLANEOUS) ×3 IMPLANT
POUCH RETRIEVAL ECOSAC 10 (ENDOMECHANICALS) ×1 IMPLANT
POUCH RETRIEVAL ECOSAC 10MM (ENDOMECHANICALS) ×2
RELOAD 45 VASCULAR/THIN (ENDOMECHANICALS) IMPLANT
RELOAD STAPLE TA45 3.5 REG BLU (ENDOMECHANICALS) IMPLANT
SCISSORS LAP 5X35 DISP (ENDOMECHANICALS) ×3 IMPLANT
SET IRRIG TUBING LAPAROSCOPIC (IRRIGATION / IRRIGATOR) IMPLANT
SHEARS HARMONIC ACE PLUS 36CM (ENDOMECHANICALS) IMPLANT
SLEEVE XCEL OPT CAN 5 100 (ENDOMECHANICALS) ×6 IMPLANT
STOPCOCK 4 WAY LG BORE MALE ST (IV SETS) IMPLANT
SUT ETHILON 2 0 PS N (SUTURE) IMPLANT
SUT MNCRL AB 4-0 PS2 18 (SUTURE) ×3 IMPLANT
SUT VICRYL 0 ENDOLOOP (SUTURE) IMPLANT
SYSTEM CARTER THOMASON II (TROCAR) IMPLANT
TOWEL OR 17X26 10 PK STRL BLUE (TOWEL DISPOSABLE) ×3 IMPLANT
TOWEL OR NON WOVEN STRL DISP B (DISPOSABLE) IMPLANT
TRAY LAPAROSCOPIC (CUSTOM PROCEDURE TRAY) ×3 IMPLANT
TROCAR BLADELESS OPT 5 100 (ENDOMECHANICALS) ×3 IMPLANT
TROCAR XCEL 12X100 BLDLESS (ENDOMECHANICALS) ×3 IMPLANT

## 2015-04-25 NOTE — Progress Notes (Signed)
Pharmacy Antibiotic Note  Courtney Arnold is a 75 y.o. female admitted on 04/21/2015 with acute cholecystitis.  Pharmacy has been consulted for Zosyn dosing.  Plan: Zosyn 3.375g IV q8h (4 hour infusion).  Height: 5\' 2"  (157.5 cm) Weight: 108 lb 7.5 oz (49.2 kg) IBW/kg (Calculated) : 50.1  Temp (24hrs), Avg:98.1 F (36.7 C), Min:97.5 F (36.4 C), Max:98.9 F (37.2 C)   Recent Labs Lab 04/21/15 1851 04/21/15 1900 04/22/15 0525 04/23/15 0556 04/24/15 0544  WBC 6.4  --  5.6 5.1 5.5  CREATININE <0.30*  --  0.34* <0.30* 0.36*  LATICACIDVEN  --  0.71  --   --   --     Estimated Creatinine Clearance: 48.6 mL/min (by C-G formula based on Cr of 0.36).    No Known Allergies  Antimicrobials this admission: Ceftriaxone 2/14 >> 2/17 Zosyn 2/17 >>    Dose adjustments this admission:  Microbiology results: 2/17 MRSA PCR: negative  Thank you for allowing pharmacy to be a part of this patient's care.   Royetta Asal, PharmD, BCPS Pager [phone] 04/25/2015 5:54 PM

## 2015-04-25 NOTE — Transfer of Care (Signed)
Immediate Anesthesia Transfer of Care Note  Patient: Courtney Arnold  Procedure(s) Performed: Procedure(s): LAPAROSCOPIC CHOLECYSTECTOMY WITH INTRAOPERATIVE CHOLANGIOGRAM (N/A)  Patient Location: PACU  Anesthesia Type:General  Level of Consciousness:  sedated, patient cooperative and responds to stimulation  Airway & Oxygen Therapy:Patient Spontanous Breathing and Patient connected to face mask oxgen  Post-op Assessment:  Report given to PACU RN and Post -op Vital signs reviewed and stable  Post vital signs:  Reviewed and stable  Last Vitals:  Filed Vitals:   04/25/15 0445 04/25/15 0919  BP: 158/49 120/70  Pulse: 94 90  Temp: 36.8 C 36.7 C  Resp: 16 18    Complications: No apparent anesthesia complications

## 2015-04-25 NOTE — Anesthesia Preprocedure Evaluation (Addendum)
Anesthesia Evaluation  Patient identified by MRN, date of birth, ID band Patient awake    Reviewed: Allergy & Precautions, NPO status , Patient's Chart, lab work & pertinent test results, reviewed documented beta blocker date and time   Airway Mallampati: I  TM Distance: >3 FB Neck ROM: Full    Dental  (+) Edentulous Upper, Edentulous Lower   Pulmonary former smoker,    breath sounds clear to auscultation       Cardiovascular Pt. on home beta blockers negative cardio ROS  + dysrhythmias Atrial Fibrillation  Rhythm:Regular Rate:Normal     Neuro/Psych PSYCHIATRIC DISORDERS negative neurological ROS     GI/Hepatic negative GI ROS, Neg liver ROS,   Endo/Other  Hyperthyroidism   Renal/GU negative Renal ROS  negative genitourinary   Musculoskeletal negative musculoskeletal ROS (+)   Abdominal   Peds negative pediatric ROS (+)  Hematology negative hematology ROS (+)   Anesthesia Other Findings   Reproductive/Obstetrics negative OB ROS                            Lab Results  Component Value Date   WBC 5.5 04/24/2015   HGB 9.5* 04/24/2015   HCT 29.3* 04/24/2015   MCV 82.5 04/24/2015   PLT 250 04/24/2015   Lab Results  Component Value Date   CREATININE 0.36* 04/24/2015   BUN 10 04/24/2015   NA 139 04/24/2015   K 3.7 04/24/2015   CL 98* 04/24/2015   CO2 34* 04/24/2015   Lab Results  Component Value Date   INR 1.13 04/22/2015   INR 1.13 04/14/2015   INR 0.9 10/02/2008   04/2015 EKG: sinus tachycardia.  Anesthesia Physical Anesthesia Plan  ASA: III  Anesthesia Plan: General   Post-op Pain Management:    Induction: Intravenous  Airway Management Planned: Oral ETT  Additional Equipment:   Intra-op Plan:   Post-operative Plan: Extubation in OR  Informed Consent: I have reviewed the patients History and Physical, chart, labs and discussed the procedure including the  risks, benefits and alternatives for the proposed anesthesia with the patient or authorized representative who has indicated his/her understanding and acceptance.   Dental advisory given  Plan Discussed with: CRNA  Anesthesia Plan Comments:         Anesthesia Quick Evaluation

## 2015-04-25 NOTE — Anesthesia Postprocedure Evaluation (Signed)
Anesthesia Post Note  Patient: NIKAILA NINAN  Procedure(s) Performed: Procedure(s) (LRB): LAPAROSCOPIC CHOLECYSTECTOMY WITH INTRAOPERATIVE CHOLANGIOGRAM (N/A)  Patient location during evaluation: PACU Anesthesia Type: General Level of consciousness: awake and alert Pain management: pain level controlled Vital Signs Assessment: post-procedure vital signs reviewed and stable Respiratory status: spontaneous breathing, nonlabored ventilation, respiratory function stable and patient connected to nasal cannula oxygen Cardiovascular status: blood pressure returned to baseline and stable Postop Assessment: no signs of nausea or vomiting Anesthetic complications: no    Last Vitals:  Filed Vitals:   04/25/15 1310 04/25/15 1400  BP: 142/52 151/60  Pulse: 97 93  Temp: 36.4 C   Resp: 16 20    Last Pain:  Filed Vitals:   04/25/15 1428  PainSc: Quinn Kinberly Perris

## 2015-04-25 NOTE — Care Management Important Message (Signed)
Important Message  Patient Details  Name: STORMEY NELTON MRN: [id number] Date of Birth: [birth year]   Medicare Important Message Given:  Yes    Camillo Flaming 04/25/2015, 12:59 [place] Message  Patient Details  Name: MAKALA MADDIX MRN: [id number] Date of Birth: Aug 25, 1940   Medicare Important Message Given:  Yes    Camillo Flaming 04/25/2015, 12:59 PM

## 2015-04-25 NOTE — Progress Notes (Signed)
[facility] PROGRESS NOTE  Courtney Arnold [id number] DOB: [birth year] DOA: 04/21/2015 PCP: Purvis Kilts, MD  Assessment/Plan: #1 acute cholecystitis Patient at present right upper quadrant pain nausea anorexia, fever and chills, normal lipase normal LFTs. Patient is status post ERCP status post stent placement 04/14/2015. CT abdomen and pelvis showing increased gallbladder wall distention in comparison associated with small gallstones. Patient was transferred from [facility] per patient request to [facility]. Patient was seen by general surgery and patient status post laparoscopic cholecystectomy with intraoperative cholangiogram per Dr. Alvino Blood 04/25/2015. Place on IV Zosyn. Continue pain management. Supportive care. Per general surgery.  #2 abdominal pain Secondary to problem #1.  #3 lower back pain status post recent kyphoplasty MRI of the T -spine performed on 04/22/2015 showed interval kyphoplasty at T6 without further loss of body height. Continue current pain regimen.  #4 hypothyroidism Appears to be thyroiditis. Continue beta blocker for rate control. Outpatient follow-up with endocrinology.  #5 hypokalemia Replete.  #6 dehydration IV fluids.  #7 gastroesophageal reflux disease PPI.  #8 prophylaxis PPI for GI prophylaxis. SCDs for DVT prophylaxis.  Code Status: Full Family Communication: Updated patient. No family present. Disposition Plan: Remain inpatient. Home when tolerating oral intake, pain improved, per general surgery.   Consultants:  Gen. surgery: Dr. Alvino Blood 04/25/2015  Gen. surgery Dr. Arnoldo Morale at [facility] 04/23/2015  Gastroenterology: Dr. Laural Golden 04/22/2015 at [facility]  Procedures:  CT abdomen and pelvis 04/21/2015 at [facility]  Laparoscopic cholecystectomy with intraoperative cholangiogram per Dr. Alvino Blood 04/25/2015  MRI T -spine 04/22/2015  Antibiotics:  IV Rocephin 04/22/2015>>>> 04/25/2015  IV  Zosyn 02/22/2016  HPI/Subjective: Patient lying in bed complaining of abdominal pain postoperatively. No nausea no vomiting.  Objective: Filed Vitals:   04/25/15 1310 04/25/15 1400  BP: 142/52 151/60  Pulse: 97 93  Temp: 97.5 F (36.4 C)   Resp: 16 20    Intake/Output Summary (Last 24 hours) at 04/25/15 1655 Last data filed at 04/25/15 1240  Gross per 24 hour  Intake   1050 ml  Output      0 ml  Net   1050 ml   Filed Weights   04/23/15 0527 04/24/15 0600 04/25/15 0445  Weight: 51.03 kg (112 lb 8 oz) 50 kg (110 lb 3.7 oz) 49.2 kg (108 lb 7.5 oz)    Exam:   General:  NAD  Cardiovascular: RRR  Respiratory: CTAB  Abdomen: Soft, tender to palpation diffuse, positive bowel sounds, no rebound, no guarding  Musculoskeletal: No, cyanosis or edema.  Data Reviewed: Basic Metabolic Panel:  Recent Labs Lab 04/21/15 1851 04/22/15 0525 04/23/15 0556 04/24/15 0544  NA 137 140 139 139  K 2.7* 3.6 3.8 3.7  CL 96* 102 99* 98*  CO2 34* 31 31 34*  GLUCOSE 105* 103* 109* 110*  BUN 10 8 6 10   CREATININE <0.30* 0.34* <0.30* 0.36*  CALCIUM 9.2 8.5* 9.0 8.8*   Liver Function Tests:  Recent Labs Lab 04/21/15 1851 04/22/15 0525 04/23/15 0556 04/24/15 0544  AST 28 22 19 15   ALT 18 15 15  13*  ALKPHOS 153* 118 136* 127*  BILITOT 0.6 0.5 0.4 0.4  PROT 6.5 5.6* 6.2* 5.9*  ALBUMIN 3.5 2.9* 3.2* 3.1*    Recent Labs Lab 04/21/15 1851  LIPASE 13   No results for input(s): AMMONIA in the last 168 hours. CBC:  Recent Labs Lab 04/21/15 1851 04/22/15 0525 04/23/15 0556 04/24/15 0544  WBC 6.4 5.6 5.1 5.5  HGB 10.9* 9.7*  9.5* 9.5*  HCT 33.3* 29.9* 29.5* 29.3*  MCV 80.6 82.1 82.2 82.5  PLT 310 280 283 250   Cardiac Enzymes: No results for input(s): CKTOTAL, CKMB, CKMBINDEX, TROPONINI in the last 168 hours. BNP (last 3 results) No results for input(s): BNP in the last 8760 hours.  ProBNP (last 3 results) No results for input(s): PROBNP in the last 8760  hours.  CBG: No results for input(s): GLUCAP in the last 168 hours.  Recent Results (from the past 240 hour(s))  Surgical pcr screen     Status: None   Collection Time: 04/25/15  8:38 AM  Result Value Ref Range Status   MRSA, PCR NEGATIVE NEGATIVE Final   Staphylococcus aureus NEGATIVE NEGATIVE Final    Comment:        The Xpert SA Assay (FDA approved for NASAL specimens in patients over 75 years of age), is one component of a comprehensive surveillance program.  Test performance has been validated by [facility] for patients greater than or equal to 7 year old. It is not intended to diagnose infection nor to guide or monitor treatment.      Studies: Dg Cholangiogram Operative  04/25/2015  CLINICAL DATA:  Cholelithiasis EXAM: INTRAOPERATIVE CHOLANGIOGRAM TECHNIQUE: Cholangiographic images from the C-arm fluoroscopic device were submitted for interpretation post-operatively. Please see the procedural report for the amount of contrast and the fluoroscopy time utilized. COMPARISON:  CT 04/21/2015 FINDINGS: No persistent filling defects in the common duct. Biliary stent is in place. There is a short segment high-grade stenosis in the mid CBD. Intrahepatic ducts are incompletely visualized, appearing decompressed centrally. Contrast passes into the duodenum. : 1. Negative for retained common duct stone. 2. Short-segment mid CBD high-grade stenosis, with biliary stent in place. Electronically Signed   By: Lucrezia Europe M.D.   On: 04/25/2015 11:36    Scheduled Meds: . bisacodyl  10 mg Rectal Once  . fentaNYL      . lidocaine  1 patch Transdermal Q24H  . metoprolol tartrate  12.5 mg Oral BID  . pantoprazole  40 mg Oral Daily  . polyethylene glycol  17 g Oral Daily   Continuous Infusions: . sodium chloride 75 mL/hr at 04/25/15 1557    Principal Problem:   Cholecystitis, acute Active Problems:   Abdominal pain   Hyperthyroidism   Nausea & vomiting   History of biliary stent  insertion   Volume depletion   Hypokalemia   Anemia    Time spent: 6 mins    [facility] MD [facility] Pager [phone]. If 7PM-7AM, please contact night-coverage at [email or web address], password [facility] 04/25/2015, 4:55 PM  LOS: 4 days

## 2015-04-25 NOTE — Consult Note (Signed)
Reason for Consult: abdominal pain Referring Physician:  [name] is an 75 y.o. female.  HPI: 75 yo female with 3 months of right sided abdominal pain. The pain has gotten worse over this time. It is not related to food. It is worse with certain positions. She recently came to the hospital and underwent ERCP with stent placement for biliary dilatation without seeing stones. She continues to have this pain despite this procedure.  Past Medical History  Diagnosis Date  . Compression fracture 07/24/09    T12; kyphoplasty  . Atrial fibrillation ([place]) 2011    Postop, spontaneous conversion to normal sinus after one hour  . Tobacco abuse   . History of echocardiogram 5/11    EF 65%    Past Surgical History  Procedure Laterality Date  . Back surgery    . Ercp N/A 04/14/2015    Procedure: ENDOSCOPIC RETROGRADE CHOLANGIOPANCREATOGRAPHY (ERCP) Biliary Sphincterotomy, 10x7 stent placement Dilated bilary system just not well seen;  Surgeon: Rogene Houston, MD;  Location: [facility];  Service: Endoscopy;  Laterality: N/A;    Family History  Problem Relation Age of Onset  . Heart attack Father   . Stroke Father   . Breast cancer Sister     Social History:  reports that she has quit smoking. She does not have any smokeless tobacco history on file. She reports that she does not drink alcohol or use illicit drugs.  Allergies: No Known Allergies  Medications: I have reviewed the patient's current medications.  Results for orders placed or performed during the hospital encounter of 04/21/15 (from the past 48 hour(s))  Comprehensive metabolic panel     Status: Abnormal   Collection Time: 04/24/15  5:44 AM  Result Value Ref Range   Sodium 139 135 - 145 mmol/L   Potassium 3.7 3.5 - 5.1 mmol/L   Chloride 98 (L) 101 - 111 mmol/L   CO2 34 (H) 22 - 32 mmol/L   Glucose, Bld 110 (H) 65 - 99 mg/dL   BUN 10 6 - 20 mg/dL   Creatinine, Ser 0.36 (L) 0.44 - 1.00 mg/dL   Calcium 8.8 (L)  8.9 - 10.3 mg/dL   Total Protein 5.9 (L) 6.5 - 8.1 g/dL   Albumin 3.1 (L) 3.5 - 5.0 g/dL   AST 15 15 - 41 U/L   ALT 13 (L) 14 - 54 U/L   Alkaline Phosphatase 127 (H) 38 - 126 U/L   Total Bilirubin 0.4 0.3 - 1.2 mg/dL   GFR calc non Af Amer >60 >60 mL/min   GFR calc Af Amer >60 >60 mL/min    Comment: (NOTE) The eGFR has been calculated using the CKD EPI equation. This calculation has not been validated in all clinical situations. eGFR's persistently <60 mL/min signify possible Chronic Kidney Disease.    Anion gap 7 5 - 15  CBC     Status: Abnormal   Collection Time: 04/24/15  5:44 AM  Result Value Ref Range   WBC 5.5 4.0 - 10.5 K/uL   RBC 3.55 (L) 3.87 - 5.11 MIL/uL   Hemoglobin 9.5 (L) 12.0 - 15.0 g/dL   HCT 29.3 (L) 36.0 - 46.0 %   MCV 82.5 78.0 - 100.0 fL   MCH 26.8 26.0 - 34.0 pg   MCHC 32.4 30.0 - 36.0 g/dL   RDW 14.5 11.5 - 15.5 %   Platelets 250 150 - 400 K/uL    No results found.  Review of Systems  Constitutional: Negative for fever and chills.  HENT: Negative for hearing loss.   Eyes: Negative for blurred vision and double vision.  Respiratory: Negative for cough and hemoptysis.   Cardiovascular: Negative for chest pain and palpitations.  Gastrointestinal: Positive for abdominal pain and constipation. Negative for nausea and vomiting.  Genitourinary: Negative for dysuria and urgency.  Musculoskeletal: Positive for back pain. Negative for myalgias and neck pain.  Skin: Negative for itching and rash.  Neurological: Negative for dizziness, tingling and headaches.  Endo/Heme/Allergies: Does not bruise/bleed easily.  Psychiatric/Behavioral: Negative for depression and suicidal ideas.   Blood pressure 158/49, pulse 94, temperature 98.3 F (36.8 C), temperature source Oral, resp. rate 16, height _0  (1.575 m), weight 49.2 kg (108 lb 7.5 oz), SpO2 96 %. Physical Exam  Vitals reviewed. Constitutional: She is oriented to person, place, and time. She appears  well-developed and well-nourished.  HENT:  Head: Normocephalic and atraumatic.  Eyes: Conjunctivae and EOM are normal. Pupils are equal, round, and reactive to light.  Neck: Normal range of motion. Neck supple.  Cardiovascular: Normal rate and regular rhythm.   Respiratory: Effort normal and breath sounds normal.  GI: Soft. Bowel sounds are normal. She exhibits no distension. There is tenderness in the right upper quadrant.  Musculoskeletal: Normal range of motion.  Neurological: She is alert and oriented to person, place, and time.  Skin: Skin is warm and dry.  Psychiatric: She has a normal mood and affect. Her behavior is normal.    Assessment/Plan: 75 yo female with evidence of cholecystitis and biliary dilation s/p ERCP. I agree that the pain is likely related to her gallbladder and stones given the findings on exam and her clinical course. We discussed the pathology of gallbladder and details of removal: that we would attempt a laparoscopic approach, remove the gallbladder from the liver, safely identify the biliary tree, and safely remove the gallbladder from the CBD. We discussed specific risks of liver injury, bleeding, CBD injury, infection, need for open procedure, and post-cholecystectomy syndrome. She showed understanding and wanted to proceed.  Courtney Arnold 04/25/2015, 7:40 AM

## 2015-04-25 NOTE — Op Note (Signed)
Preoperative diagnosis: acute cholecystitis  Postoperative diagnosis: Same   Procedure: laparoscopic cholecystectomy with intraoperative cholangiogram  Surgeon: Gurney Maxin, M.D.  Asst: none  Anesthesia: Gen.   Indications for procedure: Courtney Arnold is a 75 y.o. female with symptoms of Abdominal pain and RUQ pain consistent with gallbladder disease, Confirmed by Ultrasound and CT.  Description of procedure: The patient was brought into the operative suite, placed supine. Anesthesia was administered with endotracheal tube. Patient was strapped in place and foot board was secured. All pressure points were offloaded by foam padding. The patient was prepped and draped in the usual sterile fashion.  A small incision was made to the right of the umbilicus. A 42mm trocar was inserted into the peritoneal cavity with optical entry. Pneumoperitoneum was applied with high flow low pressure. 2 23mm trocars were placed in the RUQ. A 5mm trocar was placed in the subxiphoid space. All trocars sites were first anesthesized with 0.25% marcaine with epinephrine in the subcutaneous and preperitoneal layers. The gallbladder was covered with omentum that was also adhered to the right lobe of the liver. These were taken down with cautery and sharp dissectionNext the patient was placed in reverse trendelenberg. The gallbladder was retracted cephalad and lateral. The peritoneum was reflected off the infundibulum working lateral to medial.   The cystic duct and cystic artery were identified and further dissection revealed a critical view, due to concern for choledocholithiasis a cholangiogram was performed with [place]. CBD, both hepatic ducts were seen. The stent was visualized and filling of the duodenum was seen. No further filling defects seen. The cystic duct and cystic artery were doubly clipped and ligated.   The gallbladder was removed off the liver bed with cautery. The Gallbladder was placed in a  specimen bag. The gallbladder fossa was irrigated and hemostasis was applied with cautery. The gallbladder was removed via the 62mm trocar. No dilation of the fascia was performed. Pneumoperitoneum was removed, all trocar were removed. All incisions were closed with 4-0 monocryl subcuticular stitch. The patient woke from anesthesia and was brought to PACU in stable condition.  Findings: inflamed gallbladder  Specimen: gallbldadder  Blood loss: 50cc  Local anesthesia: 20cc 0.25% marcaine w epi  Complications: none  Gurney Maxin, M.D. General, Bariatric, & Minimally Invasive Surgery [facility]

## 2015-04-25 NOTE — Anesthesia Procedure Notes (Signed)
Procedure Name: Intubation Date/Time: 04/25/2015 10:36 AM Performed by: Maxwell Caul Pre-anesthesia Checklist: Patient identified, Emergency Drugs available, Suction available and Patient being monitored Patient Re-evaluated:Patient Re-evaluated prior to inductionOxygen Delivery Method: Circle System Utilized Preoxygenation: Pre-oxygenation with 100% oxygen Intubation Type: IV induction, Rapid sequence and Cricoid Pressure applied Laryngoscope Size: Mac and 4 Grade View: Grade I Tube type: Oral Tube size: 7.5 mm Number of attempts: 1 Airway Equipment and Method: Stylet Placement Confirmation: ETT inserted through vocal cords under direct vision,  positive ETCO2 and breath sounds checked- equal and bilateral Secured at: 21 cm Tube secured with: Tape Dental Injury: Teeth and Oropharynx as per pre-operative assessment

## 2015-04-26 DIAGNOSIS — E876 Hypokalemia: Secondary | ICD-10-CM

## 2015-04-26 DIAGNOSIS — E0590 Thyrotoxicosis, unspecified without thyrotoxic crisis or storm: Secondary | ICD-10-CM

## 2015-04-26 DIAGNOSIS — K810 Acute cholecystitis: Secondary | ICD-10-CM

## 2015-04-26 LAB — COMPREHENSIVE METABOLIC PANEL
ALT: 11 U/L — ABNORMAL LOW (ref 14–54)
AST: 18 U/L (ref 15–41)
Albumin: 3.2 g/dL — ABNORMAL LOW (ref 3.5–5.0)
Alkaline Phosphatase: 114 U/L (ref 38–126)
Anion gap: 9 (ref 5–15)
BUN: 11 mg/dL (ref 6–20)
CO2: 30 mmol/L (ref 22–32)
Calcium: 9.7 mg/dL (ref 8.9–10.3)
Chloride: 100 mmol/L — ABNORMAL LOW (ref 101–111)
Creatinine, Ser: 0.32 mg/dL — ABNORMAL LOW (ref 0.44–1.00)
GFR calc Af Amer: >60 mL/min (ref >60)
GFR calc non Af Amer: >60 mL/min (ref >60)
Glucose, Bld: 126 mg/dL — ABNORMAL HIGH (ref 65–99)
Potassium: 4.1 mmol/L (ref 3.5–5.1)
Sodium: 139 mmol/L (ref 135–145)
Total Bilirubin: 0.4 mg/dL (ref 0.3–1.2)
Total Protein: 6.5 g/dL (ref 6.5–8.1)

## 2015-04-26 LAB — CBC
HCT: 31.9 % — ABNORMAL LOW (ref 36.0–46.0)
Hemoglobin: 10.2 g/dL — ABNORMAL LOW (ref 12.0–15.0)
MCH: 26 pg (ref 26.0–34.0)
MCHC: 32 g/dL (ref 30.0–36.0)
MCV: 81.2 fL (ref 78.0–100.0)
Platelets: 291 K/uL (ref 150–400)
RBC: 3.93 MIL/uL (ref 3.87–5.11)
RDW: 14.4 % (ref 11.5–15.5)
WBC: 8.7 K/uL (ref 4.0–10.5)

## 2015-04-26 LAB — MAGNESIUM: Magnesium: 1.8 mg/dL (ref 1.7–2.4)

## 2015-04-26 MED ORDER — OXYCODONE HCL 5 MG PO TABS: 5.0000 mg | ORAL_TABLET | Freq: Four times a day (QID) | ORAL | Status: DC | PRN

## 2015-04-26 MED ORDER — ACETAMINOPHEN 325 MG PO TABS: 650.0000 mg | ORAL_TABLET | Freq: Four times a day (QID) | ORAL | Status: DC | PRN

## 2015-04-26 MED ORDER — HYDROMORPHONE HCL 1 MG/ML IJ SOLN
1.0000 mg | Freq: Once | INTRAMUSCULAR | Status: AC
  Administered 2015-04-26: 1 mg via INTRAVENOUS
  Filled 2015-04-26: qty 1

## 2015-04-26 MED ORDER — KETOROLAC TROMETHAMINE 15 MG/ML IJ SOLN
15.0000 mg | Freq: Once | INTRAMUSCULAR | Status: AC
  Administered 2015-04-26: 15 mg via INTRAVENOUS
  Filled 2015-04-26: qty 1

## 2015-04-26 NOTE — Progress Notes (Signed)
Pt leaving at 1530 with her daughter. Alert, oriented, and without c/o. Discharge instructions/prescriptions given/explained with pt verbalizing understanding. Followup appointments noted.

## 2015-04-26 NOTE — Discharge Instructions (Signed)
[facility] °LAPAROSCOPIC SURGERY: POST OP INSTRUCTIONS °Always review your discharge instruction sheet given to you by the facility where your surgery was performed. °IF YOU HAVE DISABILITY OR FAMILY LEAVE FORMS, YOU MUST BRING THEM TO THE OFFICE FOR PROCESSING.   °DO NOT GIVE THEM TO YOUR DOCTOR. ° °1. A prescription for pain medication may be given to you upon discharge.  Take your pain medication as prescribed, if needed.  If narcotic pain medicine is not needed, then you may take acetaminophen (Tylenol) or ibuprofen (Advil) as needed. °2. Take your usually prescribed medications unless otherwise directed. °3. If you need a refill on your pain medication, please contact your pharmacy.  They will contact our office to request authorization. Prescriptions will not be filled after 5pm or on week-ends. °4. You should follow a light diet the first few days after arrival home, such as soup and crackers, etc.  Be sure to include lots of fluids daily. °5. Most patients will experience some swelling and bruising in the area of the incisions.  Ice packs will help.  Swelling and bruising can take several days to resolve.  °6. It is common to experience some constipation if taking pain medication after surgery.  Increasing fluid intake and taking a stool softener (such as Colace) will usually help or prevent this problem from occurring.  A mild laxative (Milk of Magnesia or Miralax) should be taken according to package instructions if there are no bowel movements after 48 hours. °7. Unless discharge instructions indicate otherwise, you may remove your bandages 24-48 hours after surgery, and you may shower at that time.  You may have steri-strips (small skin tapes) in place directly over the incision.  These strips should be left on the skin for 7-10 days.  If your surgeon used skin glue on the incision, you may shower in 24 hours.  The glue will flake off over the next 2-3 weeks.  Any sutures or  staples will be removed at the office during your follow-up visit. °8. ACTIVITIES:  You may resume regular (light) daily activities beginning the next day--such as daily self-care, walking, climbing stairs--gradually increasing activities as tolerated.  You may have sexual intercourse when it is comfortable.  Refrain from any heavy lifting or straining until approved by your doctor. °a. You may drive when you are no longer taking prescription pain medication, you can comfortably wear a seatbelt, and you can safely maneuver your car and apply brakes. °9. You should see your doctor in the office for a follow-up appointment approximately 2-3 weeks after your surgery.  Make sure that you call for this appointment within a day or two after you arrive home to insure a convenient appointment time. °10. OTHER INSTRUCTIONS:  °WHEN TO CALL YOUR DOCTOR: °1. Fever over 101.0 °2. Inability to urinate °3. Continued bleeding from incision. °4. Increased pain, redness, or drainage from the incision. °5. Increasing abdominal pain ° °The clinic staff is available to answer your questions during regular business hours.  Please don’t hesitate to call and ask to speak to one of the nurses for clinical concerns.  If you have a medical emergency, go to the nearest emergency room or call 911.  A surgeon from [facility] is always on call at the hospital. °[address], [address], , [place]  [address] ? [address] [address], , [place]   [address] °[phone] ? [phone] ? [phone] [phone] °Web site: [email or web address] ° °

## 2015-04-26 NOTE — Progress Notes (Signed)
1 Day Post-Op  Subjective: Pt denies n/v. Having some soreness on right side of abd. Reports she ate some fruit and part of an omelette for breakfast. Very anxious to be dc. States she has a lot going on at home. Hasn't seen husband in 10 days.   Objective: Vital signs in last 24 hours: Temp:  [97.5 F (36.4 C)-98.9 F (37.2 C)] 97.9 F (36.6 C) (02/18 0533) Pulse Rate:  [88-103] 99 (02/18 0533) Resp:  [16-21] 18 (02/18 0533) BP: (129-169)/(52-139) 151/70 mmHg (02/18 0533) SpO2:  [91 %-100 %] 95 % (02/18 0533) FiO2 (%):  [2 %] 2 % (02/17 1400) Last BM Date: 04/25/15  Intake/Output from previous day: 02/17 0701 - 02/18 0700 In: 1815.4 [I.V.:1565.4; IV Piggyback:250] Out: -  Intake/Output this shift:    Sitting in chair, nontoxic, not ill appearing. Maybe a little uncomfortable vs anxious - hard to tell cta b/l Reg Soft, nd, incisions c/d/i, approp TTP.  No edema  Lab Results:   Recent Labs  04/24/15 0544 04/26/15 0611  WBC 5.5 8.7  HGB 9.5* 10.2*  HCT 29.3* 31.9*  PLT 250 291   BMET  Recent Labs  04/24/15 0544 04/26/15 0611  NA 139 139  K 3.7 4.1  CL 98* 100*  CO2 34* 30  GLUCOSE 110* 126*  BUN 10 11  CREATININE 0.36* 0.32*  CALCIUM 8.8* 9.7   PT/INR No results for input(s): LABPROT, INR in the last 72 hours. ABG No results for input(s): PHART, HCO3 in the last 72 hours.  Invalid input(s): PCO2, PO2  Studies/Results: Dg Cholangiogram Operative  04/25/2015  CLINICAL DATA:  Cholelithiasis EXAM: INTRAOPERATIVE CHOLANGIOGRAM TECHNIQUE: Cholangiographic images from the C-arm fluoroscopic device were submitted for interpretation post-operatively. Please see the procedural report for the amount of contrast and the fluoroscopy time utilized. COMPARISON:  CT 04/21/2015 FINDINGS: No persistent filling defects in the common duct. Biliary stent is in place. There is a short segment high-grade stenosis in the mid CBD. Intrahepatic ducts are incompletely visualized,  appearing decompressed centrally. Contrast passes into the duodenum. : 1. Negative for retained common duct stone. 2. Short-segment mid CBD high-grade stenosis, with biliary stent in place. Electronically Signed   By: Lucrezia Europe M.D.   On: 04/25/2015 11:36    Anti-infectives: Anti-infectives    Start     Dose/Rate Route Frequency Ordered Stop   04/25/15 1800  piperacillin-tazobactam (ZOSYN) IVPB 3.375 g     3.375 g 12.5 mL/hr over 240 Minutes Intravenous Every 8 hours 04/25/15 1735     04/22/15 0945  [MAR Hold]  cefTRIAXone (ROCEPHIN) 1 g in dextrose 5 % 50 mL IVPB  Status:  Discontinued     (MAR Hold since 04/25/15 1004)   1 g 100 mL/hr over 30 Minutes Intravenous Every 24 hours 04/22/15 0939 04/25/15 1314      Assessment/Plan: s/p Procedure(s): LAPAROSCOPIC CHOLECYSTECTOMY WITH INTRAOPERATIVE CHOLANGIOGRAM (N/A)  Doing well as expected after cholecystectomy Will give 1 dose of toradol Labs ok Believe she can be discharged. Discussed dc instructions with pt. Advised her she needs to make sure she takes care of herself first and not over do it then she won't be able to help anyone.   F/u Dr Kieth Brightly 2-3 weeks Will need f/u with Dr Laural Golden as well for biliary stent  Leighton Ruff. Redmond Pulling, MD, FACS General, Bariatric, & Minimally Invasive Surgery [facility] [place]   LOS: 5 days    Gayland Curry 04/26/2015

## 2015-04-26 NOTE — Discharge Summary (Signed)
Courtney Arnold, is a 75 y.o. female  DOB [birth year]  MRN [id number].  Admission date:  04/21/2015  Admitting Physician  Roney Jaffe, MD  Discharge Date:  04/26/2015   Primary MD  Purvis Kilts, MD  Recommendations for primary care physician for things to follow:  - please check CBC, BMP during next visit. - to follow with Sx in 3 weeks.   Admission Diagnosis  Hypokalemia [E87.6] Compression fracture of L2 lumbar vertebra, sequela [S32.020S] Abdominal pain, unspecified abdominal location [R10.9] Intractable vomiting with nausea, vomiting of unspecified type [R11.10]   Discharge Diagnosis  Hypokalemia [E87.6] Compression fracture of L2 lumbar vertebra, sequela [S32.020S] Abdominal pain, unspecified abdominal location [R10.9] Intractable vomiting with nausea, vomiting of unspecified type [R11.10]    Principal Problem:   Cholecystitis, acute Active Problems:   Abdominal pain   Hyperthyroidism   Nausea & vomiting   History of biliary stent insertion   Volume depletion   Hypokalemia   Anemia   Lower back pain      Past Medical History  Diagnosis Date  . Compression fracture 07/24/09    T12; kyphoplasty  . Atrial fibrillation ([place]) 2011    Postop, spontaneous conversion to normal sinus after one hour  . Tobacco abuse   . History of echocardiogram 5/11    EF 65%    Past Surgical History  Procedure Laterality Date  . Back surgery    . Ercp N/A 04/14/2015    Procedure: ENDOSCOPIC RETROGRADE CHOLANGIOPANCREATOGRAPHY (ERCP) Biliary Sphincterotomy, 10x7 stent placement Dilated bilary system just not well seen;  Surgeon: Rogene Houston, MD;  Location: [facility];  Service: Endoscopy;  Laterality: N/A;  . Cholecystectomy N/A 04/25/2015    Procedure: LAPAROSCOPIC CHOLECYSTECTOMY WITH INTRAOPERATIVE CHOLANGIOGRAM;  Surgeon: Arta Bruce Kinsinger, MD;  Location: [facility];  Service: General;   Laterality: N/A;       History of present illness and  Hospital Course:     Kindly see H&P for history of present illness and admission details, please review complete Labs, Consult reports and Test reports for all details in brief  HPI  from the history and physical done on the day of admission 04/21/2015  HPI: Courtney Arnold is a 75 y.o. 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 Underwent ERCP, no bile duct stones. Stent placed for biliary decompression. Also dx'd with exopthalmos and hyperthyroidism, to f/u w endocrine in OP setting.   Patient returns with abd , n/v for the last several days. Severe mid abd pain. No fevers, chills, CP, SOB or cough. No joint pain, rash, HA, blurred vision or paralysis. Asked to see for admission. CT abd done in ED showed pneumobilia prob related to recent ERCP/ instrumentation, o/w no acute findings on CT. Bile ducts are decompressed w noted interval placement of biliary stent. Stones in GB, small.    Hospital Course   #1 acute cholecystitis Patient presents  with  right upper quadrant pain nausea anorexia, fever and chills, normal lipase normal LFTs. Patient is status post ERCP status post stent placement 04/14/2015. CT abdomen and pelvis showing increased gallbladder wall distention in comparison associated with small gallstones. Patient was transferred from [facility] per patient request to [facility]. Patient was seen by general surgery , treated with IV Zosyn,status post laparoscopic cholecystectomy with intraoperative cholangiogram per Dr. Alvino Blood 04/25/2015, she was tolerating regular diet since yesterday, patient is eager to go home today, she will be discharged to follow with neurosurgery in 3 weeks from discharge.   #2 abdominal pain Secondary to problem #1.  #3 lower back pain status post recent  kyphoplasty MRI of the T -spine performed on 04/22/2015 showed interval kyphoplasty at T6 without further loss of body height. Continue current pain regimen.  #4 hyperthyroidism Continue with methimazole and metoprolol.  #5 hypokalemia Repleted  #6 dehydration Treated with IV fluids      Discharge Condition: stable   Follow UP  Follow-up Information    Follow up with Mickeal Skinner, MD. Schedule an appointment as soon as possible for a visit in 3 weeks.   Specialty:  General Surgery   Why:  For wound re-check   Contact information:   [place] [address] [phone]       Follow up with Rogene Houston, MD On 05/13/2015.   Specialty:  Gastroenterology   Why:  8:15 AM   Contact information:   [place] [address] [phone]         Discharge Instructions  and  Discharge Medications    Discharge Instructions    Discharge instructions    Complete by:  As directed   Follow with Primary MD Purvis Kilts, MD in 7 days   Get CBC, CMP,  checked  by Primary MD next visit.    Activity: As tolerated with Full fall precautions use walker/cane & assistance as needed   Disposition Home **   Diet: Heart Healthy , soft , with feeding assistance and aspiration precautions.  For Heart failure patients - Check your Weight same time everyday, if you gain over 2 pounds, or you develop in leg swelling, experience more shortness of breath or chest pain, call your Primary MD immediately. Follow Cardiac Low Salt Diet and 1.5 lit/day fluid restriction.   On your next visit with your primary care physician please Get Medicines reviewed and adjusted.   Please request your Prim.MD to go over all Hospital Tests and Procedure/Radiological results at the follow up, please get all Hospital records sent to your Prim MD by signing hospital release before you go home.   If you experience worsening of your admission  symptoms, develop shortness of breath, life threatening emergency, suicidal or homicidal thoughts you must seek medical attention immediately by calling 911 or calling your MD immediately  if symptoms less severe.  You Must read complete instructions/literature along with all the possible adverse reactions/side effects for all the Medicines you take and that have been prescribed to you. Take any new Medicines after you have completely understood and accpet all the possible adverse reactions/side effects.   Do not drive, operating heavy machinery, perform activities at heights, swimming or participation in water activities or provide baby sitting services if your were admitted for syncope or siezures until you have seen by Primary MD or a Neurologist and advised to do so again.  Do not  drive when taking Pain medications.    Do not take more than prescribed Pain, Sleep and Anxiety Medications  Special Instructions: If you have smoked or chewed Tobacco  in the last 2 yrs please stop smoking, stop any regular Alcohol  and or any Recreational drug use.  Wear Seat belts while driving.   Please note  You were cared for by a hospitalist during your hospital stay. If you have any questions about your discharge medications or the care you received while you were in the hospital after you are discharged, you can call the unit and asked to speak with the hospitalist on call if the hospitalist that took care of you is not available. Once you are discharged, your primary care physician will handle any further medical issues. Please note that NO REFILLS for any discharge medications will be authorized once you are discharged, as it is imperative that you return to your primary care physician (or establish a relationship with a primary care physician if you do not have one) for your aftercare needs so that they can reassess your need for medications and monitor your lab values.     Increase activity slowly     Complete by:  As directed             Medication List    STOP taking these medications        oxyCODONE-acetaminophen 5-325 MG tablet  Commonly known as:  PERCOCET/ROXICET      TAKE these medications        acetaminophen 325 MG tablet  Commonly known as:  TYLENOL  Take 2 tablets (650 mg total) by mouth every 6 (six) hours as needed for mild pain (or Fever >/= 101).     aspirin EC 81 MG tablet  Take 81 mg by mouth daily.     methimazole 5 MG tablet  Commonly known as:  TAPAZOLE  Take 1 tablet (5 mg total) by mouth daily.     metoprolol tartrate 25 MG tablet  Commonly known as:  LOPRESSOR  Take 0.5 tablets (12.5 mg total) by mouth 2 (two) times daily.     oxyCODONE 5 MG immediate release tablet  Commonly known as:  Oxy IR/ROXICODONE  Take 1 tablet (5 mg total) by mouth every 6 (six) hours as needed for severe pain.          Diet and Activity recommendation: See Discharge Instructions above   Consults obtained -  Gen. surgery: Dr. Alvino Blood 04/25/2015  [facility] surgery Dr[facility]o Morale at [facility] 04/23/2015  Gastroenterology: Dr. Laural Golden 04/22/2015 at [facility] procedures and Radiology Reports - PLEASE review detailed and final reports for all details, in brief -    CT abdomen and pelvis 04/21/2015 at [facility]  Laparoscopic cholecystectomy with intraoperative cholangiogram per Dr. Alvino Blood 04/25/2015  MRI T -spine 04/22/2015   Dg Cholangiogram Operative  04/25/2015  CLINICAL DATA:  Cholelithiasis EXAM: INTRAOPERATIVE CHOLANGIOGRAM TECHNIQUE: Cholangiographic images from the C-arm fluoroscopic device were submitted for interpretation post-operatively. Please see the procedural report for the amount of contrast and the fluoroscopy time utilized. COMPARISON:  CT 04/21/2015 FINDINGS: No persistent filling defects in the common duct. Biliary stent is in place. There is a short segment high-grade stenosis in the mid CBD. Intrahepatic ducts are  incompletely visualized, appearing decompressed centrally. Contrast passes into the duodenum. : 1. Negative for retained common duct stone. 2. Short-segment mid CBD high-grade stenosis, with biliary stent in place. Electronically Signed   By:  Lucrezia Europe M.D.   On: 04/25/2015 11:36   Ct Lumbar Spine Wo Contrast  04/07/2015  CLINICAL DATA:  Low back pain after repetitive lifting for the past 2 months. Prior vertebral augmentation. EXAM: CT LUMBAR SPINE WITHOUT CONTRAST TECHNIQUE: Multidetector CT imaging of the lumbar spine was performed without intravenous contrast administration. Multiplanar CT image reconstructions were also generated. COMPARISON:  Plain films of 07/01/2009 and MRI of 06/10/2009. FINDINGS: Soft tissues: Motion degradation. Normal aortic caliber with atherosclerosis within. No gross hydronephrosis or retroperitoneal process identified. Bones: Spinal visualization from the top of T12 to the upper sacrum. Status post vertebral augmentation at T12 secondary to an underlying moderate compression deformity. Lumbar vertebral body height is maintained. Loss of intervertebral disc height at the lumbosacral junction. Mild disc bulges at L3-4 and L4-5. Combined with facet arthropathy and ligamentum flavum thickening, these result in mild central canal stenosis. IMPRESSION: 1. Lumbar spondylosis and degenerative disc disease, without acute osseous finding. 2. Status post T12 vertebral augmentation. Electronically Signed   By: Abigail Miyamoto M.D.   On: 04/07/2015 19:17   Mr Thoracic Spine Wo Contrast  04/22/2015  CLINICAL DATA:  Back pain, prior back surgery. EXAM: MRI THORACIC SPINE WITHOUT CONTRAST TECHNIQUE: Multiplanar, multisequence MR imaging of the thoracic spine was performed. No intravenous contrast was administered. COMPARISON:  04/07/2015 FINDINGS: The patient was also scheduled for a lumbar spine MRI but refused lumbar imaging. Despite efforts by the technologist and patient, motion artifact is  present on today's exam and could not be eliminated. This reduces exam sensitivity and specificity. No significant abnormal spinal cord signal is observed. There has been interval kyphoplasty at the T6 vertebral level, with low signal attack relate in the vertebral body. There is edema in the vertebral body, not unexpected this soon after kyphoplasty, and also possibly related to the original fracture. There is only perhaps 2 mm of posterior bony retropulsion, unchanged from the CT from 04/07/2015. Trace amount of methacrylate and in the left paraspinal soft tissues, tracking between the T8 vertebral body and the descending aorta. Remote vertebral augmentation at T12, without further collapse and without vertebral edema at this level. No other vertebral edema is noted in the thoracic spine. Additional findings at individual levels are as follows: T1-2:  Unremarkable. T2-3:  Unremarkable. T3-4:  Unremarkable. T4-5:  Unremarkable. T5-6:  Unremarkable. T6-7: Mild right foraminal stenosis due to slight bony retropulsion along the anterior margin of the right neural foramen, image 5 series 3. T7-8:  Unremarkable. T8-9:  Unremarkable. T9-10:  Unremarkable. T10-11:  No impingement.  Mild disc bulge. T11-12: No impingement. Mild bilateral facet arthropathy with mild posterior spurring and somewhat broad central disc protrusion. T12-L1: Unremarkable. IMPRESSION: 1. Interval kyphoplasty at T6, without any further loss of vertebral body height. There is edema in the vertebral body due to the recent compression and possibly related also to the kyphoplasty. Small amount of left anterior paraspinal extension of methacrylate between the T8 vertebral body and the descending thoracic aorta. 2. Mild right foraminal stenosis at C6-7 due to slight bony retropulsion related to the prior fracture. This appears unchanged from the preprocedural CT scan performed on 04/07/2015. 3. Prior vertebral augmentation at T12 without significant  change. Electronically Signed   By: Van Clines M.D.   On: 04/22/2015 16:53   US Soft Tissue Head/neck  04/14/2015  CLINICAL DATA:  Hyperthyroidism.  TSH of 0.02. EXAM: THYROID ULTRASOUND TECHNIQUE: Ultrasound examination of the thyroid gland and adjacent soft tissues was performed. COMPARISON:  None. FINDINGS: Right thyroid lobe Measurements: 4.9 x 1.8 x 1.9 cm. The right lobe is mildly enlarged and diffusely heterogeneous. There is a focal mildly complex cyst in the mid right lobe measuring 0.5 x 0.3 x 0.5 cm and likely representing a small colloid cyst. No solid nodules are identified. Left thyroid lobe Measurements: 4.9 x 1.8 x 1.8 cm. The left lobe is mildly enlarged and diffusely heterogeneous. No nodules are identified. Isthmus Thickness: 0.9 cm.  No nodules visualized. There is suggestion of mildly increased diffuse vascularity throughout the thyroid parenchyma. This is nonspecific but may imply a component of thyroiditis. Lymphadenopathy None visualized. IMPRESSION: Mildly enlarged and diffusely heterogeneous thyroid gland with suggestion of increased vascularity. Increased vascularity may imply a component of thyroiditis. Small mildly complex cyst in the right lobe measuring 0.5 cm likely represents a small colloid cyst. Electronically Signed   By: Aletta Edouard M.D.   On: 04/14/2015 15:09   Ct Abdomen Pelvis W Contrast  04/21/2015  CLINICAL DATA:  Recent gallbladder stent last Monday. Back surgery on Friday. Now with pain in the epigastric area. Unable to eat or drink. EXAM: CT ABDOMEN AND PELVIS WITH CONTRAST TECHNIQUE: Multidetector CT imaging of the abdomen and pelvis was performed using the standard protocol following bolus administration of intravenous contrast. CONTRAST:  132mL OMNIPAQUE IOHEXOL 300 MG/ML  SOLN COMPARISON:  04/12/2015 FINDINGS: Mild dependent changes in the lung bases. Minimal pericardial effusion. Interval placement of a biliary stent. There is some decompression of  the bile ducts post stent placement compared with previous study. Pneumobilia consistent with instrumentation. There is a stone in the gallbladder. Mild gallbladder wall thickening. No infiltration in the fat around the gallbladder. No focal liver lesions identified. The pancreas, spleen, adrenal glands, abdominal aorta, inferior vena cava, and retroperitoneal lymph nodes are unremarkable. Bilateral renal cysts, largest on the left measuring 3.1 cm diameter. No hydronephrosis. Stomach, small bowel, and colon are not abnormally distended. Stool fills the colon. Contrast material is present in the terminal ileum suggesting no evidence of bowel obstruction. No discrete wall thickening is appreciated. No free air or free fluid in the abdomen. Abdominal wall musculature appears intact. Pelvis: The appendix is normal. Uterus and ovaries are not enlarged. Bladder wall is not thickened. No free or loculated pelvic fluid collections. No pelvic mass or lymphadenopathy. Degenerative changes in the spine and shoulders. Compression fracture of L4 demonstrates mild progression since previous study. Old compression of T12 post kyphoplasty. IMPRESSION: Interval placement of a biliary stent with decompression of the bile ducts. Pneumobilia is consistent with instrumentation. Cholelithiasis with small stone in the gallbladder. Gallbladder wall thickening is nonspecific but could indicate inflammatory change in the appropriate clinical setting. Minimal pericardial effusion. Compression fracture of L2 vertebra demonstrates mild progression since previous study. Electronically Signed   By: Lucienne Capers M.D.   On: 04/21/2015 21:51   Ct Abdomen Pelvis W Contrast  04/12/2015  CLINICAL DATA:  Nausea, vomiting and mid abdominal pain for 1 day. History of spinal fractures, atrial fibrillation. EXAM: CT ABDOMEN AND PELVIS WITH CONTRAST TECHNIQUE: Multidetector CT imaging of the abdomen and pelvis was performed using the standard protocol  following bolus administration of intravenous contrast. CONTRAST:  147mL OMNIPAQUE IOHEXOL 300 MG/ML  SOLN COMPARISON:  RIGHT upper quadrant ultrasound April 08, 2015 and CT thoracolumbar spine April 07, 2015. FINDINGS: LUNG BASES: Included view of the lung bases are clear. Visualized heart and pericardium are unremarkable. SOLID ORGANS: The gallbladder is very distended, with subcentimeter gallstone. No  gallbladder wall thickening or pericholecystic fluid. Moderate intrahepatic biliary dilatation to the level of the mid Common bile duct without CT findings of choledocholithiasis. Common bile duct was 11 mm, now 14 mm. No pancreatic head mass. No significant pancreatic ductal dilatation. Spleen, adrenal glands are normal. GASTROINTESTINAL TRACT: The stomach, small and large bowel are normal in course and caliber without inflammatory changes. Mild descending colon and sigmoid diverticulosis. Moderate amount of retained ascending colonic stool. Normal appendix. KIDNEYS/ URINARY TRACT: Kidneys are orthotopic, demonstrating symmetric enhancement. No nephrolithiasis, hydronephrosis or solid renal masses. 12 mm simple cyst upper pole RIGHT kidney. Too small to characterize hypodensities in the kidneys bilaterally. 3.2 cm LEFT lower pole cyst. The unopacified ureters are normal in course and caliber. Delayed imaging through the kidneys demonstrates symmetric prompt contrast excretion within the proximal urinary collecting system. Urinary bladder is well distended and unremarkable. PERITONEUM/RETROPERITONEUM: Aortoiliac vessels are normal in course and caliber, mild calcific atherosclerosis. No lymphadenopathy by CT size criteria. Internal reproductive organs are unremarkable. No intraperitoneal free fluid nor free air. SOFT TISSUE/OSSEOUS STRUCTURES: Patient is osteopenic. New moderate L4 burst fracture with 30-50% height loss, 4 mm retro post bony fragments. Severe L5-S1 degenerative disc. Status post T12 vertebral  body cement augmentation without progressed height loss. Punctate calcification RIGHT breast. IMPRESSION: New moderate L4 burst fracture, 4 mm retropulsed bony fragments without malalignment. Status post T12 vertebral body cement augmentation with stable height loss. Gallbladder distention and cholelithiasis without CT findings of acute cholecystitis. Worsening intra and extrahepatic biliary dilatation to level of mid Common bile duct with there is apparent stenosis/stricture though, obstructing lesion not excluded. Electronically Signed   By: Elon Alas M.D.   On: 04/12/2015 22:57   Dg Ercp Biliary & Pancreatic Ducts  04/14/2015  CLINICAL DATA:  Intrahepatic and extrahepatic biliary dilatation with abrupt decrease in diameter of distal CBD at the pancreatic head on CT, cholelithiasis, question obstruction EXAM: ERCP with sphincterotomy TECHNIQUE: Multiple spot images obtained with the fluoroscopic device and submitted for interpretation post-procedure. FLUOROSCOPY TIME:  Radiation Exposure Index (as provided by the fluoroscopic device): Not provided If the device does not provide the exposure index: Fluoroscopy Time:  3 minutes 4 seconds Number of Acquired Images:  4 cine series plus 9 single frame images COMPARISON:  CT abdomen pelvis 04/12/2015 FINDINGS: Retained contrast within the hepatic flexure of the colon from preceding CT limits exam, being superimposed with the distal CBD. Images demonstrate a significantly dilated CBD, in access of the diameter of the 14 mm diameter endoscope. Dilatation of the common hepatic duct and central intrahepatic biliary radicles also identified. No definite intraluminal filling defects are visualized. Distal CBD poorly assessed due to superimposed contrast, unable to exclude distal CBD pathology by this exam. Biliary stent was placed at the conclusion of the procedure with good drainage of contrast. IMPRESSION: Significantly dilated intrahepatic and extrahepatic  biliary trees though the distal CBD is suboptimally visualized due to superimposed retained contrast in the hepatic flexure of the colon. Cause of distal CBD obstruction is uncertain and distal CBD pathology is not excluded by this study. Good drainage of biliary contrast following CBD stenting. These images were submitted for radiologic interpretation only. Please see the procedural report for the amount of contrast and the fluoroscopy time utilized. Electronically Signed   By: Lavonia Dana M.D.   On: 04/14/2015 11:25   Ct Angio Chest Aorta W/cm &/or Wo/cm  04/07/2015  CLINICAL DATA:  Thoracic back pain from shoulder blades with lower back for  2 months. EXAM: CT ANGIOGRAPHY CHEST, ABDOMEN AND PELVIS TECHNIQUE: Multidetector CT imaging through the chest, abdomen and pelvis was performed using the standard protocol during bolus administration of intravenous contrast. Multiplanar reconstructed images and MIPs were obtained and reviewed to evaluate the vascular anatomy. CONTRAST:  123mL OMNIPAQUE IOHEXOL 350 MG/ML SOLN COMPARISON:  Chest CT 01/16/2004 FINDINGS: CTA CHEST FINDINGS THORACIC INLET/BODY WALL: No acute abnormality. MEDIASTINUM: Normal heart size. No pericardial effusion. No evidence of acute aortic syndrome, including intramural hematoma, dissection, or penetrating ulcer. There is atherosclerosis of the aorta without aneurysm. Opacified pulmonary arteries are patent. No adenopathy. LUNG WINDOWS: No consolidation. No effusion. No suspicious pulmonary nodule. Mild scarring in the lingula and in the right lower lobe near a endplate spur. OSSEOUS: T6 compression fracture with a horizontal band of sclerosis in the upper body, extending vertically at the junction of the body and pedicles. Height loss is in the 25-50% range. No retropulsion. No discrete fracture line is seen and the injury is likely subacute. Remote T12 compression fracture status post cement augmentation. Usual degenerative changes, with focal  advanced disc narrowing at L5-S1. Review of the MIP images confirms the above findings. CTA ABDOMEN AND PELVIS FINDINGS Abdominal wall:  No contributory findings. Hepatobiliary: No focal liver abnormality. Dilated gallbladder and biliary tree. Common bile duct at pancreatic head measures 12 mm in diameter. Cholelithiasis but no calcified choledocholithiasis. No evidence of ampullary region mass. No inflammatory changes around the gallbladder. Pancreas: Unremarkable. Spleen: Unremarkable. Adrenals/Urinary Tract: Negative adrenals. No hydronephrosis or stone. 25 mm left renal cyst. Unremarkable bladder. Reproductive:  Pelvic floor laxity. Stomach/Bowel:  No obstruction. No inflammatory changes. Vascular/Lymphatic: Standard aortic branching. Atherosclerosis without aneurysm or dissection. No mass or adenopathy. Peritoneal: No ascites or pneumoperitoneum. Musculoskeletal: Please see above concerning thoracic spine fracture. Review of the MIP images confirms the above findings. IMPRESSION: 1. T6 compression fracture with moderate height loss, likely subacute. 2. No evidence for acute aortic syndrome. 3. Dilated gallbladder and biliary tree without visible cause (non-obstructive gallstone present). Correlate with liver function tests and consider sonography or cholangiography. Electronically Signed   By: Monte Fantasia M.D.   On: 04/07/2015 21:23   Ct Cta Abd/pel W/cm &/or W/o Cm  04/07/2015  CLINICAL DATA:  Thoracic back pain from shoulder blades with lower back for 2 months. EXAM: CT ANGIOGRAPHY CHEST, ABDOMEN AND PELVIS TECHNIQUE: Multidetector CT imaging through the chest, abdomen and pelvis was performed using the standard protocol during bolus administration of intravenous contrast. Multiplanar reconstructed images and MIPs were obtained and reviewed to evaluate the vascular anatomy. CONTRAST:  148mL OMNIPAQUE IOHEXOL 350 MG/ML SOLN COMPARISON:  Chest CT 01/16/2004 FINDINGS: CTA CHEST FINDINGS THORACIC  INLET/BODY WALL: No acute abnormality. MEDIASTINUM: Normal heart size. No pericardial effusion. No evidence of acute aortic syndrome, including intramural hematoma, dissection, or penetrating ulcer. There is atherosclerosis of the aorta without aneurysm. Opacified pulmonary arteries are patent. No adenopathy. LUNG WINDOWS: No consolidation. No effusion. No suspicious pulmonary nodule. Mild scarring in the lingula and in the right lower lobe near a endplate spur. OSSEOUS: T6 compression fracture with a horizontal band of sclerosis in the upper body, extending vertically at the junction of the body and pedicles. Height loss is in the 25-50% range. No retropulsion. No discrete fracture line is seen and the injury is likely subacute. Remote T12 compression fracture status post cement augmentation. Usual degenerative changes, with focal advanced disc narrowing at L5-S1. Review of the MIP images confirms the above findings. CTA ABDOMEN AND PELVIS  FINDINGS Abdominal wall:  No contributory findings. Hepatobiliary: No focal liver abnormality. Dilated gallbladder and biliary tree. Common bile duct at pancreatic head measures 12 mm in diameter. Cholelithiasis but no calcified choledocholithiasis. No evidence of ampullary region mass. No inflammatory changes around the gallbladder. Pancreas: Unremarkable. Spleen: Unremarkable. Adrenals/Urinary Tract: Negative adrenals. No hydronephrosis or stone. 25 mm left renal cyst. Unremarkable bladder. Reproductive:  Pelvic floor laxity. Stomach/Bowel:  No obstruction. No inflammatory changes. Vascular/Lymphatic: Standard aortic branching. Atherosclerosis without aneurysm or dissection. No mass or adenopathy. Peritoneal: No ascites or pneumoperitoneum. Musculoskeletal: Please see above concerning thoracic spine fracture. Review of the MIP images confirms the above findings. IMPRESSION: 1. T6 compression fracture with moderate height loss, likely subacute. 2. No evidence for acute aortic  syndrome. 3. Dilated gallbladder and biliary tree without visible cause (non-obstructive gallstone present). Correlate with liver function tests and consider sonography or cholangiography. Electronically Signed   By: Monte Fantasia M.D.   On: 04/07/2015 21:23   US Abdomen Limited Ruq  04/08/2015  CLINICAL DATA:  Gallbladder dilation seen on CT. EXAM: US ABDOMEN LIMITED - RIGHT UPPER QUADRANT COMPARISON:  CT of the chest abdomen pelvis dated 04/07/2015 FINDINGS: Gallbladder: The gallbladder is prominent, however sub pathologically distended, measuring 4.5 cm transversely. There is no evidence of gallbladder wall thickening. Gallbladder wall measures 1.7 mm. There is a small bowel 1.5 cm gallbladder wall calculus. The sonographic Percell Miller sign is negative. Common bile duct: Diameter: The extrahepatic common bile duct is diffusely dilated measuring up to 1.1 cm. There is also a diffuse dilation of the main pancreatic duct which measures up to 3.7 mm. Liver: No focal lesion identified. Within normal limits in parenchymal echogenicity. Mild intra hepatic biliary ductal dilation is also seen. IMPRESSION: Cholelithiasis without evidence of acute cholecystitis. Prominent size of the gallbladder, although still sub pathologic. Diffuse dilation of the intra and extrahepatic bile ducts, and main pancreatic duct. Pancreas is not well visualized, as it was obscured by bowel gas. Obstruction at the level of the ampulla could not be excluded. Electronically Signed   By: Fidela Salisbury M.D.   On: 04/08/2015 08:43    Micro Results     Recent Results (from the past 240 hour(s))  Surgical pcr screen     Status: None   Collection Time: 04/25/15  8:38 AM  Result Value Ref Range Status   MRSA, PCR NEGATIVE NEGATIVE Final   Staphylococcus aureus NEGATIVE NEGATIVE Final    Comment:        The Xpert SA Assay (FDA approved for NASAL specimens in patients over 35 years of age), is one component of a comprehensive  surveillance program.  Test performance has been validated by [facility] for patients greater than or equal to 62 year old. It is not intended to diagnose infection nor to guide or monitor treatment.        Today   Subjective:   Herberta Guerreiro today has no headache,no chest  pain, port some soreness and right abdomen, tolerating diet,, feels much better and eager to go home today. Objective:   Blood pressure 151/70, pulse 99, temperature 97.9 F (36.6 C), temperature source Oral, resp. rate 18, height 5\' 2"  (1.575 m), weight 49.2 kg (108 lb 7.5 oz), SpO2 95 %.   Intake/Output Summary (Last 24 hours) at 04/26/15 1133 Last data filed at 04/26/15 0725  Gross per 24 hour  Intake 2055.44 ml  Output      1 ml  Net 2054.44 ml  Exam Awake Alert, Oriented x 3, No new F.N deficits, Normal affect [place].AT,PERRAL Supple Neck,No JVD, No cervical lymphadenopathy appriciated.  Symmetrical Chest wall movement, Good air movement bilaterally, CTAB RRR,No Gallops,Rubs or new Murmurs, No Parasternal Heave +ve B.Sounds, Abd Soft,incisions c/d/i , No organomegaly appriciated, No rebound -guarding or rigidity. No Cyanosis, Clubbing or edema, No new Rash or bruise  Data Review   CBC w Diff: Lab Results  Component Value Date   WBC 8.7 04/26/2015   HGB 10.2* 04/26/2015   HCT 31.9* 04/26/2015   PLT 291 04/26/2015   LYMPHOPCT 33 04/12/2015   MONOPCT 10 04/12/2015   EOSPCT 4 04/12/2015   BASOPCT 0 04/12/2015    CMP: Lab Results  Component Value Date   NA 139 04/26/2015   K 4.1 04/26/2015   CL 100* 04/26/2015   CO2 30 04/26/2015   BUN 11 04/26/2015   CREATININE 0.32* 04/26/2015   PROT 6.5 04/26/2015   ALBUMIN 3.2* 04/26/2015   BILITOT 0.4 04/26/2015   ALKPHOS 114 04/26/2015   AST 18 04/26/2015   ALT 11* 04/26/2015  .   Total Time in preparing paper work, data evaluation and todays exam - 35 minutes  ELGERGAWY, DAWOOD M.D on 04/26/2015 at 11:33 AM  [facility]    Office  [phone]

## 2015-05-01 ENCOUNTER — Other Ambulatory Visit (HOSPITAL_COMMUNITY): Payer: Self-pay | Admitting: Family Medicine

## 2015-05-01 DIAGNOSIS — Z1389 Encounter for screening for other disorder: Secondary | ICD-10-CM | POA: Diagnosis not present

## 2015-05-01 DIAGNOSIS — Z681 Body mass index (BMI) 19 or less, adult: Secondary | ICD-10-CM | POA: Diagnosis not present

## 2015-05-01 DIAGNOSIS — K8010 Calculus of gallbladder with chronic cholecystitis without obstruction: Secondary | ICD-10-CM | POA: Diagnosis not present

## 2015-05-01 DIAGNOSIS — E0590 Thyrotoxicosis, unspecified without thyrotoxic crisis or storm: Secondary | ICD-10-CM | POA: Diagnosis not present

## 2015-05-02 DIAGNOSIS — R790 Abnormal level of blood mineral: Secondary | ICD-10-CM | POA: Diagnosis not present

## 2015-05-02 DIAGNOSIS — M1991 Primary osteoarthritis, unspecified site: Secondary | ICD-10-CM | POA: Diagnosis not present

## 2015-05-02 DIAGNOSIS — Z79899 Other long term (current) drug therapy: Secondary | ICD-10-CM | POA: Diagnosis not present

## 2015-05-02 DIAGNOSIS — M810 Age-related osteoporosis without current pathological fracture: Secondary | ICD-10-CM | POA: Diagnosis not present

## 2015-05-13 ENCOUNTER — Ambulatory Visit (INDEPENDENT_AMBULATORY_CARE_PROVIDER_SITE_OTHER): Payer: Medicare Other | Admitting: Internal Medicine

## 2015-05-15 ENCOUNTER — Telehealth: Payer: Self-pay

## 2015-05-15 NOTE — Telephone Encounter (Signed)
I received a referral from Dr. Hilma Favors to schedule the patient a screening colonoscopy. However, after review of epic it appears she is a patient of Dr. Laural Golden. Therefore I have faxed a copy back to Dr. Hilma Favors to let him know.

## 2015-05-22 ENCOUNTER — Telehealth (INDEPENDENT_AMBULATORY_CARE_PROVIDER_SITE_OTHER): Payer: Self-pay | Admitting: Internal Medicine

## 2015-05-22 NOTE — Telephone Encounter (Signed)
[name], Ms. Zegarelli' daughter called saying she saw Dr. Laural Golden in the hallway and he told her Ms. Erisman needs to have a stint removed. She's unsure of where the stint is. She thought it was in her bladder but she's not sure. Due to [name] being Ms. Bangert' only transportation, she's wondering when and where a stint removal can take place and if everything she needs can be performed in one day due to having difficulty getting off from work. Please call [name] in regards to this.  [name]'s ph# [phone] Thank you.

## 2015-05-23 ENCOUNTER — Encounter (INDEPENDENT_AMBULATORY_CARE_PROVIDER_SITE_OTHER): Payer: Self-pay | Admitting: *Deleted

## 2015-05-23 ENCOUNTER — Other Ambulatory Visit (INDEPENDENT_AMBULATORY_CARE_PROVIDER_SITE_OTHER): Payer: Self-pay | Admitting: Internal Medicine

## 2015-05-23 DIAGNOSIS — K810 Acute cholecystitis: Secondary | ICD-10-CM

## 2015-05-23 NOTE — Telephone Encounter (Signed)
ERCp sch'd 06/27/15 & preop 4/18 @ 300, [name] is aware and instructions have been mailed to patient

## 2015-05-27 ENCOUNTER — Encounter: Payer: Self-pay | Admitting: Endocrinology

## 2015-05-27 ENCOUNTER — Ambulatory Visit (INDEPENDENT_AMBULATORY_CARE_PROVIDER_SITE_OTHER): Payer: Medicare Other | Admitting: Endocrinology

## 2015-05-27 VITALS — BP 104/64 | HR 86 | Temp 98.0°F | Ht 61.75 in | Wt 108.0 lb

## 2015-05-27 DIAGNOSIS — E0590 Thyrotoxicosis, unspecified without thyrotoxic crisis or storm: Secondary | ICD-10-CM | POA: Diagnosis not present

## 2015-05-27 LAB — T4, FREE: Free T4: 1.94 ng/dL — ABNORMAL HIGH (ref 0.60–1.60)

## 2015-05-27 LAB — TSH: TSH: 0.12 u[IU]/mL — ABNORMAL LOW (ref 0.35–4.50)

## 2015-05-27 MED ORDER — METOPROLOL TARTRATE 25 MG PO TABS: 12.5000 mg | ORAL_TABLET | Freq: Every day | ORAL | Status: DC

## 2015-05-27 NOTE — Progress Notes (Signed)
Subjective:    Patient ID: Courtney Arnold, female    DOB: Apr 01, 1940, 75 y.o.   MRN: [id number]  HPI Pt reports few months of swelling around both eyes, and assoc irritation.  She was found to he hyperthyroid, and was rx'ed tapazole.  she has never had XRT to the anterior neck, or thyroid surgery.  she has never had thyroid imaging.  she does not consume kelp or any other prescribed or non-prescribed thyroid medication.  she has never been on amiodarone.   Past Medical History  Diagnosis Date  . Compression fracture 07/24/09    T12; kyphoplasty  . Atrial fibrillation ([place]) 2011    Postop, spontaneous conversion to normal sinus after one hour  . Tobacco abuse   . History of echocardiogram 5/11    EF 65%    Past Surgical History  Procedure Laterality Date  . Back surgery    . Ercp N/A 04/14/2015    Procedure: ENDOSCOPIC RETROGRADE CHOLANGIOPANCREATOGRAPHY (ERCP) Biliary Sphincterotomy, 10x7 stent placement Dilated bilary system just not well seen;  Surgeon: Rogene Houston, MD;  Location: [facility];  Service: Endoscopy;  Laterality: N/A;  . Cholecystectomy N/A 04/25/2015    Procedure: LAPAROSCOPIC CHOLECYSTECTOMY WITH INTRAOPERATIVE CHOLANGIOGRAM;  Surgeon: Arta Bruce Kinsinger, MD;  Location: [facility];  Service: General;  Laterality: N/A;    Social History   Social History  . Marital Status: Married    Spouse Name: N/A  . Number of Children: 2  . Years of Education: N/A   Occupational History  . Not on file.   Social History Main Topics  . Smoking status: Former Smoker -- 0.15 packs/day for 20 years  . Smokeless tobacco: Not on file  . Alcohol Use: No  . Drug Use: No  . Sexual Activity: Not Currently   Other Topics Concern  . Not on file   Social History Narrative   Active in gardens and does yard work.    Current Outpatient Prescriptions on File Prior to Visit  Medication Sig Dispense Refill  . acetaminophen (TYLENOL) 325 MG tablet Take 2 tablets (650 mg total) by  mouth every 6 (six) hours as needed for mild pain (or Fever >/= 101).    Marland Kitchen aspirin EC 81 MG tablet Take 81 mg by mouth daily.    . methimazole (TAPAZOLE) 5 MG tablet Take 1 tablet (5 mg total) by mouth daily. 30 tablet 1  . oxyCODONE (OXY IR/ROXICODONE) 5 MG immediate release tablet Take 1 tablet (5 mg total) by mouth every 6 (six) hours as needed for severe pain. 30 tablet 0   No current facility-administered medications on file prior to visit.    No Known Allergies  Family History  Problem Relation Age of Onset  . Heart attack Father   . Stroke Father   . Breast cancer Sister   . Thyroid disease Neg Hx     BP 104/64 mmHg  Pulse 86  Temp(Src) 98 F (36.7 C) (Oral)  Ht 5' 1.75" (1.568 m)  Wt 108 lb (48.988 kg)  BMI 19.92 kg/m2  SpO2 94%  Review of Systems denies headache, hoarseness, visual loss, palpitations, sob, diarrhea, muscle weakness, excessive diaphoresis, tremor, heat intolerance, and rhinorrhea.  She has lost 13 lbs over the past few months.  She has anxiety, easy bruising, and urinary frequency.    Objective:   Physical Exam VS: see vs page GEN: no distress HEAD: head: no deformity eyes: no periorbital swelling; moderate bilat proptosis external nose and ears  are normal mouth: no lesion seen NECK: thyroid is slightly and diffusely enlarged CHEST WALL: no deformity LUNGS:  Clear to auscultation CV: reg rate and rhythm, no murmur ABD: abdomen is soft, nontender.  no hepatosplenomegaly.  not distended.  no hernia MUSCULOSKELETAL: muscle bulk and strength are grossly normal.  no obvious joint swelling.  gait is normal and steady EXTEMITIES: no deformity.  no edema PULSES: dorsalis pedis intact bilat.  no carotid bruit NEURO:  cn 2-12 grossly intact.   readily moves all 4's.  sensation is intact to touch on all 4's.  No tremor.   SKIN:  Normal texture and temperature.  No rash or suspicious lesion is visible.   NODES:  None palpable at the neck.  PSYCH: alert,  well-oriented.  Does not appear anxious nor depressed.   Lab Results  Component Value Date   TSH 0.12* 05/27/2015   T3TOTAL 206* 04/15/2015    i personally reviewed electrocardiogram tracing (04/11/14): Indication: abd pain.   Impression: NS-ST/T changes    Assessment & Plan:  Hyperthyroidism: control is improved.    Patient is advised the following: Patient Instructions  Please reduce the metoprolol to once a day blood tests are requested for you today.  We'll let you know about the results. if ever you have fever while taking methimazole, stop it and call us, even if the reason is obvious, because of the risk of a rare side-effect. Please come back for a follow-up appointment in 2 months.  Please let me know if you want to take the radioactive iodine pill.  It works like this: We would first check a thyroid "scan" (a special, but easy and painless type of thyroid x ray).  you go to the x-ray department of the hospital to swallow a pill, which contains a miniscule amount of radiation.  You will not notice any symptoms from this.  You will go back to the x-ray department the next day, to lie down in front of a camera.  The results of this will be sent to me.   Based on the results, i hope to order for you a treatment pill of radioactive iodine.  Although it is a larger amount of radiation, you will again notice no symptoms from this.  The pill is gone from your body in a few days (during which you should stay away from other people), but takes several months to work.  Therefore, please return here approximately 6-8 weeks after the treatment.  This treatment has been available for many years, and the only known side-effect is an underactive thyroid.  It is possible that i would eventually prescribe for you a thyroid hormone pill, which is very inexpensive.  You don't have to worry about side-effects of this thyroid hormone pill, because it is the same molecule your thyroid makes.

## 2015-05-27 NOTE — Patient Instructions (Addendum)
Please reduce the metoprolol to once a day blood tests are requested for you today.  We'll let you know about the results. if ever you have fever while taking methimazole, stop it and call us, even if the reason is obvious, because of the risk of a rare side-effect. Please come back for a follow-up appointment in 2 months.  Please let me know if you want to take the radioactive iodine pill.  It works like this: We would first check a thyroid "scan" (a special, but easy and painless type of thyroid x ray).  you go to the x-ray department of the hospital to swallow a pill, which contains a miniscule amount of radiation.  You will not notice any symptoms from this.  You will go back to the x-ray department the next day, to lie down in front of a camera.  The results of this will be sent to me.   Based on the results, i hope to order for you a treatment pill of radioactive iodine.  Although it is a larger amount of radiation, you will again notice no symptoms from this.  The pill is gone from your body in a few days (during which you should stay away from other people), but takes several months to work.  Therefore, please return here approximately 6-8 weeks after the treatment.  This treatment has been available for many years, and the only known side-effect is an underactive thyroid.  It is possible that i would eventually prescribe for you a thyroid hormone pill, which is very inexpensive.  You don't have to worry about side-effects of this thyroid hormone pill, because it is the same molecule your thyroid makes.

## 2015-05-31 ENCOUNTER — Emergency Department (HOSPITAL_COMMUNITY)
Admission: EM | Admit: 2015-05-31 | Discharge: 2015-05-31 | Disposition: A | Discharge status: Home / Self Care | Payer: Medicare Other | Attending: Emergency Medicine | Admitting: Emergency Medicine

## 2015-05-31 ENCOUNTER — Emergency Department (HOSPITAL_COMMUNITY): Payer: Medicare Other

## 2015-05-31 ENCOUNTER — Encounter (HOSPITAL_COMMUNITY): Payer: Self-pay

## 2015-05-31 DIAGNOSIS — Z87891 Personal history of nicotine dependence: Secondary | ICD-10-CM | POA: Diagnosis not present

## 2015-05-31 DIAGNOSIS — Z9889 Other specified postprocedural states: Secondary | ICD-10-CM | POA: Insufficient documentation

## 2015-05-31 DIAGNOSIS — M546 Pain in thoracic spine: Secondary | ICD-10-CM | POA: Diagnosis present

## 2015-05-31 DIAGNOSIS — I4891 Unspecified atrial fibrillation: Secondary | ICD-10-CM | POA: Diagnosis not present

## 2015-05-31 DIAGNOSIS — M8448XA Pathological fracture, other site, initial encounter for fracture: Secondary | ICD-10-CM | POA: Diagnosis not present

## 2015-05-31 DIAGNOSIS — M549 Dorsalgia, unspecified: Secondary | ICD-10-CM

## 2015-05-31 DIAGNOSIS — Z7982 Long term (current) use of aspirin: Secondary | ICD-10-CM | POA: Diagnosis not present

## 2015-05-31 DIAGNOSIS — S22050A Wedge compression fracture of T5-T6 vertebra, initial encounter for closed fracture: Secondary | ICD-10-CM | POA: Diagnosis not present

## 2015-05-31 DIAGNOSIS — R05 Cough: Secondary | ICD-10-CM | POA: Insufficient documentation

## 2015-05-31 DIAGNOSIS — M4854XA Collapsed vertebra, not elsewhere classified, thoracic region, initial encounter for fracture: Secondary | ICD-10-CM

## 2015-05-31 DIAGNOSIS — Z79899 Other long term (current) drug therapy: Secondary | ICD-10-CM | POA: Diagnosis not present

## 2015-05-31 MED ORDER — SODIUM CHLORIDE 0.9 % IV BOLUS (SEPSIS)
1000.0000 mL | Freq: Once | INTRAVENOUS | Status: AC
  Administered 2015-05-31: 1000 mL via INTRAVENOUS

## 2015-05-31 MED ORDER — GADOBENATE DIMEGLUMINE 529 MG/ML IV SOLN
10.0000 mL | Freq: Once | INTRAVENOUS | Status: AC | PRN
  Administered 2015-05-31: 10 mL via INTRAVENOUS

## 2015-05-31 MED ORDER — OXYCODONE HCL 5 MG PO TABS: 5.0000 mg | ORAL_TABLET | ORAL | Status: DC | PRN

## 2015-05-31 MED ORDER — BENZONATATE 100 MG PO CAPS
100.0000 mg | ORAL_CAPSULE | Freq: Once | ORAL | Status: AC
  Administered 2015-05-31: 100 mg via ORAL
  Filled 2015-05-31: qty 1

## 2015-05-31 MED ORDER — MORPHINE SULFATE (PF) 4 MG/ML IV SOLN
4.0000 mg | Freq: Once | INTRAVENOUS | Status: AC
  Administered 2015-05-31: 4 mg via INTRAVENOUS
  Filled 2015-05-31: qty 1

## 2015-05-31 MED ORDER — LORAZEPAM 2 MG/ML IJ SOLN
0.5000 mg | Freq: Once | INTRAMUSCULAR | Status: AC
  Administered 2015-05-31: 0.5 mg via INTRAVENOUS
  Filled 2015-05-31: qty 1

## 2015-05-31 MED ORDER — ONDANSETRON HCL 4 MG/2ML IJ SOLN
4.0000 mg | Freq: Once | INTRAMUSCULAR | Status: AC
  Administered 2015-05-31: 4 mg via INTRAVENOUS
  Filled 2015-05-31: qty 2

## 2015-05-31 MED ORDER — IPRATROPIUM-ALBUTEROL 0.5-2.5 (3) MG/3ML IN SOLN
3.0000 mL | Freq: Once | RESPIRATORY_TRACT | Status: AC
  Administered 2015-05-31: 3 mL via RESPIRATORY_TRACT
  Filled 2015-05-31: qty 3

## 2015-05-31 NOTE — ED Notes (Signed)
Pt. Having upper back pain, Had surgery last month in that area and since surgery the pain has not improved.  Pt.s daughter reports that the pain has become worse in the last few days.  Skin is warm and dry. Pt. Denies any chest pain or sob. Skin is pink, warm and dry.

## 2015-05-31 NOTE — ED Provider Notes (Signed)
CSN: [id number]     Arrival date & time 05/31/15  1046 History   First MD Initiated Contact with Patient 05/31/15 1130     Chief Complaint  Patient presents with  . Back Pain     (Consider location/radiation/quality/duration/timing/severity/associated sxs/prior Treatment) HPI   Courtney Arnold is a 75 y.o. female, with a history of spinal surgery and A. fib, presenting to the ED with upper back pain for the last three days. Pt rates her pain at 10/10, aching, nonradiating. Pt states her oxycodone has not relieved the pain. Pt had spinal surgery in her thoracic spine performed at an outpatient facility by Dr. Roselee Culver about a month ago. Pt called Dr. Clarice Pole office and the soonest she can get in to see him is next Tuesday. Patient and patient's daughter at the bedside and could not remember what type of surgery she had. Pt denies fever/chills, N/V, neuro deficits, abdominal pain, changes in bowel or bladder function, falls/trauma, or any other complaints.     Past Medical History  Diagnosis Date  . Compression fracture 07/24/09    T12; kyphoplasty  . Atrial fibrillation ([place]) 2011    Postop, spontaneous conversion to normal sinus after one hour  . Tobacco abuse   . History of echocardiogram 5/11    EF 65%   Past Surgical History  Procedure Laterality Date  . Back surgery    . Ercp N/A 04/14/2015    Procedure: ENDOSCOPIC RETROGRADE CHOLANGIOPANCREATOGRAPHY (ERCP) Biliary Sphincterotomy, 10x7 stent placement Dilated bilary system just not well seen;  Surgeon: Rogene Houston, MD;  Location: [facility];  Service: Endoscopy;  Laterality: N/A;  . Cholecystectomy N/A 04/25/2015    Procedure: LAPAROSCOPIC CHOLECYSTECTOMY WITH INTRAOPERATIVE CHOLANGIOGRAM;  Surgeon: Arta Bruce Kinsinger, MD;  Location: [facility];  Service: General;  Laterality: N/A;   Family History  Problem Relation Age of Onset  . Heart attack Father   . Stroke Father   . Breast cancer Sister   . Thyroid disease Neg Hx     Social History  Substance Use Topics  . Smoking status: Former Smoker -- 0.15 packs/day for 20 years  . Smokeless tobacco: None  . Alcohol Use: No   OB History    No data available     Review of Systems  Constitutional: Negative for fever and chills.  Respiratory: Positive for cough. Negative for shortness of breath.   Cardiovascular: Negative for chest pain.  Gastrointestinal: Negative for nausea, vomiting and abdominal pain.  Musculoskeletal: Positive for back pain. Negative for neck pain.  Skin: Negative for color change and pallor.  Neurological: Negative for weakness and numbness.  All other systems reviewed and are negative.     Allergies  Review of patient's allergies indicates no known allergies.  Home Medications   Prior to Admission medications   Medication Sig Start Date End Date Taking? Authorizing Provider  acetaminophen (TYLENOL) 325 MG tablet Take 2 tablets (650 mg total) by mouth every 6 (six) hours as needed for mild pain (or Fever >/= 101). 04/26/15  Yes Albertine Patricia, MD  aspirin EC 81 MG tablet Take 81 mg by mouth daily.   Yes Historical Provider, MD  methimazole (TAPAZOLE) 5 MG tablet Take 1 tablet (5 mg total) by mouth daily. 04/15/15  Yes Coralie Common, MD  metoprolol tartrate (LOPRESSOR) 25 MG tablet Take 0.5 tablets (12.5 mg total) by mouth daily. 05/27/15  Yes Renato Shin, MD  oxyCODONE (OXY IR/ROXICODONE) 5 MG immediate release tablet Take 1 tablet (5  mg total) by mouth every 6 (six) hours as needed for severe pain. 04/26/15  Yes Albertine Patricia, MD  oxyCODONE (ROXICODONE) 5 MG immediate release tablet Take 1 tablet (5 mg total) by mouth every 4 (four) hours as needed for severe pain. 05/31/15   Zymeir Salminen C Gisel Vipond, PA-C   BP 99/57 mmHg  Pulse 109  Temp(Src) 98.7 F (37.1 C) (Oral)  Resp 16  SpO2 93% Physical Exam  Constitutional: She is oriented to person, place, and time. She appears well-developed and well-nourished. No distress.  HENT:   Head: Normocephalic and atraumatic.  Eyes: Conjunctivae and EOM are normal. Pupils are equal, round, and reactive to light.  Neck: Normal range of motion. Neck supple.  Cardiovascular: Normal rate, regular rhythm, normal heart sounds and intact distal pulses.   Pulmonary/Chest: Effort normal and breath sounds normal. No respiratory distress.  Abdominal: Soft. There is no tenderness. There is no guarding.  Musculoskeletal: She exhibits no edema or tenderness.  Full ROM in all extremities. Spinal tenderness in the thoracic spine around T4-T8. Kyphosis noted.  Lymphadenopathy:    She has no cervical adenopathy.  Neurological: She is alert and oriented to person, place, and time. She has normal reflexes.  No sensory deficits. Strength 5/5 in all extremities. No gait disturbance. Coordination intact. Cranial nerves III-XII grossly intact. No facial droop.   Skin: Skin is warm and dry. She is not diaphoretic.  Psychiatric: She has a normal mood and affect. Her behavior is normal.  Nursing note and vitals reviewed.   ED Course  Procedures (including critical care time) Labs Review Labs Reviewed - No data to display  Imaging Review Dg Chest 2 View  05/31/2015  CLINICAL DATA:  Cough, upper back pain EXAM: CHEST  2 VIEW COMPARISON:  2/14/ 17 FINDINGS: Cardiomediastinal silhouette is stable. No infiltrate or pulmonary edema. There is prior kyphoplasty at T6 and T12 level. There is mild compression deformity upper endplate of T7 vertebral body which is new from prior exam. Clinical correlation is necessary to exclude acute fracture. IMPRESSION: No infiltrate or pulmonary edema. There is prior kyphoplasty at T6 and T12 level. There is mild compression deformity upper endplate of T7 vertebral body which is new from prior exam. Clinical correlation is necessary to exclude acute fracture. Electronically Signed   By: Lahoma Crocker M.D.   On: 05/31/2015 12:48   Mr Thoracic Spine W Wo Contrast  05/31/2015   CLINICAL DATA:  History of thoracic spine compression fractures. Status post T6 kyphoplasty 04/18/2015. Back pain about the kyphoplasty site. EXAM: MRI THORACIC SPINE WITHOUT AND WITH CONTRAST TECHNIQUE: Multiplanar and multiecho pulse sequences of the thoracic spine were obtained without and with intravenous contrast. CONTRAST:  10 ml MULTIHANCE GADOBENATE DIMEGLUMINE 529 MG/ML IV SOLN COMPARISON:  Thoracic spine MRI 04/22/2015. CT abdomen and pelvis 04/21/2015. FINDINGS: Again seen is postoperative change of T6 and T12 vertebral augmentation. Extrusion of cement is seen along the left aspect of the T6 vertebral body unchanged and may be in a paravertebral vein, unchanged. Mild marrow edema persists in T6 but appears improved compared to the prior MRI. Since the prior MRI, the patient has suffered a T7 biconcave compression fracture with vertebral body height loss of approximately 50%. There is marrow edema and enhancement in the vertebral body consistent with acute or subacute injury. There is partial visualization of an L4 compression fracture which is seen on the prior CT abdomen and pelvis. Vertebral body height is otherwise maintained. Vertebral body alignment is normal.  There is mild exaggeration of thoracic kyphosis. The thoracic cord demonstrates normal signal. T7-8: There is minimal bony retropulsion off the inferior endplate of T7. The central canal and foramina remain open. Intervertebral disc spaces are otherwise unremarkable and unchanged in appearance. IMPRESSION: Since the prior MRI, the patient has suffered a biconcave compression fracture of T7 with vertebral body height loss of approximately 50%. The fracture is acute or subacute. There is minimal bony retropulsion off the inferior endplate of T7 without resultant central canal or foraminal stenosis. Unchanged appearance of T6 and T12 compression fractures where the patient is status post vertebral augmentation. Partial visualization of a L4  compression fracture which is seen on the prior CT abdomen and pelvis. Electronically Signed   By: Inge Rise M.D.   On: 05/31/2015 14:02   I have personally reviewed and evaluated these images and lab results as part of my medical decision-making.  MR Thoracic Spine from 04/22/2015: IMPRESSION: 1. Interval kyphoplasty at T6, without any further loss of vertebral body height. There is edema in the vertebral body due to the recent compression and possibly related also to the kyphoplasty. Small amount of left anterior paraspinal extension of methacrylate between the T8 vertebral body and the descending thoracic aorta. 2. Mild right foraminal stenosis at C6-7 due to slight bony retropulsion related to the prior fracture. This appears unchanged from the preprocedural CT scan performed on 04/07/2015. 3. Prior vertebral augmentation at T12 without significant change.   EKG Interpretation None       MDM   Final diagnoses:  Back pain  Non-traumatic compression fracture of T7 thoracic vertebra, initial encounter    Earle Gell Stephen complains of mid back pain in the area of her previous surgery for the last 3 days.  Findings and plan of care discussed with Jola Schmidt, MD. Dr. Venora Maples personally evaluated and examined this patient.  Pt also complains of a nonproductive cough that began 2 days ago. Patient also requested that this be addressed and evaluated. MRI of the thoracic spine was ordered to assure that the patient does not have a postoperative infection or other serious complication. Ativan was necessary to facilitate the MRI due to patient anxiety. 2:03 PM Upon reassessment, patient states that her pain has completely resolved. Her cough has also improved. 2:27 PM New compression fracture at T7 viewed on MRI. This information was communicated with the patient. Neurosurgery consult. Patient's pain is still controlled. 2:38 PM Spoke with Dr. Joya Salm, neurosurgeon, who states that  the patient can be discharged with pain management and instructions to follow-up with Dr. Ellene Route on Monday, March 27. No brace needed. No further instructions. This information was communicated with the patient. Patient voiced understanding of this information, agrees to the plan, and is comfortable with discharge. Repeat neurologic exam prior to discharge shows no abnormalities.   Filed Vitals:   05/31/15 1141 05/31/15 1200 05/31/15 1349 05/31/15 1415  BP: 110/77 116/64 115/61 113/42  Pulse: 93 86 102 108  Temp:      TempSrc:      Resp: 16 16 16 16   SpO2: 97% 94% 100% 98%   Filed Vitals:   05/31/15 1400 05/31/15 1415 05/31/15 1430 05/31/15 1445  BP: 107/60 113/42 113/49 99/57  Pulse: 107 108 108 109  Temp:      TempSrc:      Resp:  16  16  SpO2: 96% 98% 98% 93%      Lorayne Bender, PA-C 05/31/15 [place], MD  05/31/15 1512 

## 2015-05-31 NOTE — Discharge Instructions (Signed)
You have been seen today for back pain. Your MRI shows a new compression fracture at T7. Per Dr. Harley Hallmark recommendation, follow up with Dr. Ellene Route on Monday, March 27. Call Monday morning, tell them the new information about your fracture, and see that they get you in. Follow up with PCP as needed. Return to ED should symptoms worsen.

## 2015-06-04 DIAGNOSIS — S22000A Wedge compression fracture of unspecified thoracic vertebra, initial encounter for closed fracture: Secondary | ICD-10-CM | POA: Diagnosis not present

## 2015-06-06 HISTORY — PX: BACK SURGERY: SHX140

## 2015-06-09 DIAGNOSIS — S22000A Wedge compression fracture of unspecified thoracic vertebra, initial encounter for closed fracture: Secondary | ICD-10-CM | POA: Diagnosis not present

## 2015-06-09 DIAGNOSIS — S22060D Wedge compression fracture of T7-T8 vertebra, subsequent encounter for fracture with routine healing: Secondary | ICD-10-CM | POA: Diagnosis not present

## 2015-06-09 DIAGNOSIS — X58XXXD Exposure to other specified factors, subsequent encounter: Secondary | ICD-10-CM | POA: Diagnosis not present

## 2015-06-09 DIAGNOSIS — S22060A Wedge compression fracture of T7-T8 vertebra, initial encounter for closed fracture: Secondary | ICD-10-CM | POA: Diagnosis not present

## 2015-06-10 ENCOUNTER — Telehealth: Payer: Self-pay | Admitting: Endocrinology

## 2015-06-10 ENCOUNTER — Other Ambulatory Visit (HOSPITAL_COMMUNITY)
Admission: RE | Admit: 2015-06-10 | Discharge: 2015-06-10 | Disposition: A | Discharge status: Home / Self Care | Payer: Medicare Other | Source: Ambulatory Visit | Attending: Internal Medicine | Admitting: Internal Medicine

## 2015-06-10 ENCOUNTER — Telehealth (INDEPENDENT_AMBULATORY_CARE_PROVIDER_SITE_OTHER): Payer: Self-pay | Admitting: *Deleted

## 2015-06-10 DIAGNOSIS — K831 Obstruction of bile duct: Secondary | ICD-10-CM | POA: Insufficient documentation

## 2015-06-10 DIAGNOSIS — R1084 Generalized abdominal pain: Secondary | ICD-10-CM

## 2015-06-10 LAB — HEPATIC FUNCTION PANEL
ALT: 11 U/L — ABNORMAL LOW (ref 14–54)
AST: 21 U/L (ref 15–41)
Albumin: 3.5 g/dL (ref 3.5–5.0)
Alkaline Phosphatase: 206 U/L — ABNORMAL HIGH (ref 38–126)
Bilirubin, Direct: <0.1 mg/dL — ABNORMAL LOW (ref 0.1–0.5)
Total Bilirubin: 0.3 mg/dL (ref 0.3–1.2)
Total Protein: 6.7 g/dL (ref 6.5–8.1)

## 2015-06-10 MED ORDER — METHIMAZOLE 5 MG PO TABS: 5.0000 mg | ORAL_TABLET | Freq: Every day | ORAL | Status: DC

## 2015-06-10 NOTE — Telephone Encounter (Signed)
Rx submitted per pt's request.  

## 2015-06-10 NOTE — Telephone Encounter (Signed)
Per Dr.Rehman the patient will need to have labs drawn. 

## 2015-06-10 NOTE — Telephone Encounter (Signed)
PT daughter called to get her Thyroid medication refilled and sent into [facility]

## 2015-06-11 ENCOUNTER — Emergency Department (HOSPITAL_COMMUNITY): Payer: Medicare Other

## 2015-06-11 ENCOUNTER — Emergency Department (HOSPITAL_COMMUNITY)
Admission: EM | Admit: 2015-06-11 | Discharge: 2015-06-11 | Disposition: A | Discharge status: Home / Self Care | Payer: Medicare Other | Attending: Emergency Medicine | Admitting: Emergency Medicine

## 2015-06-11 ENCOUNTER — Encounter (HOSPITAL_COMMUNITY): Payer: Self-pay

## 2015-06-11 DIAGNOSIS — R1013 Epigastric pain: Secondary | ICD-10-CM | POA: Diagnosis not present

## 2015-06-11 DIAGNOSIS — Z8781 Personal history of (healed) traumatic fracture: Secondary | ICD-10-CM | POA: Diagnosis not present

## 2015-06-11 DIAGNOSIS — Z7982 Long term (current) use of aspirin: Secondary | ICD-10-CM | POA: Insufficient documentation

## 2015-06-11 DIAGNOSIS — Z79899 Other long term (current) drug therapy: Secondary | ICD-10-CM | POA: Diagnosis not present

## 2015-06-11 DIAGNOSIS — R0902 Hypoxemia: Secondary | ICD-10-CM | POA: Diagnosis not present

## 2015-06-11 DIAGNOSIS — R000 Tachycardia, unspecified: Secondary | ICD-10-CM | POA: Diagnosis not present

## 2015-06-11 DIAGNOSIS — R1011 Right upper quadrant pain: Secondary | ICD-10-CM | POA: Diagnosis not present

## 2015-06-11 DIAGNOSIS — Z87891 Personal history of nicotine dependence: Secondary | ICD-10-CM | POA: Insufficient documentation

## 2015-06-11 DIAGNOSIS — Z0389 Encounter for observation for other suspected diseases and conditions ruled out: Secondary | ICD-10-CM | POA: Diagnosis not present

## 2015-06-11 LAB — COMPREHENSIVE METABOLIC PANEL
ALT: 12 U/L — ABNORMAL LOW (ref 14–54)
AST: 20 U/L (ref 15–41)
Albumin: 3.5 g/dL (ref 3.5–5.0)
Alkaline Phosphatase: 203 U/L — ABNORMAL HIGH (ref 38–126)
Anion gap: 13 (ref 5–15)
BUN: 12 mg/dL (ref 6–20)
CO2: 30 mmol/L (ref 22–32)
Calcium: 9.6 mg/dL (ref 8.9–10.3)
Chloride: 94 mmol/L — ABNORMAL LOW (ref 101–111)
Creatinine, Ser: 0.37 mg/dL — ABNORMAL LOW (ref 0.44–1.00)
GFR calc Af Amer: >60 mL/min (ref >60)
GFR calc non Af Amer: >60 mL/min (ref >60)
Glucose, Bld: 116 mg/dL — ABNORMAL HIGH (ref 65–99)
Potassium: 3 mmol/L — ABNORMAL LOW (ref 3.5–5.1)
Sodium: 137 mmol/L (ref 135–145)
Total Bilirubin: 0.5 mg/dL (ref 0.3–1.2)
Total Protein: 7.1 g/dL (ref 6.5–8.1)

## 2015-06-11 LAB — URINALYSIS, ROUTINE W REFLEX MICROSCOPIC
Bilirubin Urine: NEGATIVE
Glucose, UA: NEGATIVE mg/dL
Hgb urine dipstick: NEGATIVE
Ketones, ur: 15 mg/dL — AB
Leukocytes, UA: NEGATIVE
Nitrite: NEGATIVE
Protein, ur: NEGATIVE mg/dL
Specific Gravity, Urine: 1.037 — ABNORMAL HIGH (ref 1.005–1.030)
pH: 7 (ref 5.0–8.0)

## 2015-06-11 LAB — CBC
HCT: 35.4 % — ABNORMAL LOW (ref 36.0–46.0)
Hemoglobin: 10.9 g/dL — ABNORMAL LOW (ref 12.0–15.0)
MCH: 24.6 pg — ABNORMAL LOW (ref 26.0–34.0)
MCHC: 30.8 g/dL (ref 30.0–36.0)
MCV: 79.9 fL (ref 78.0–100.0)
Platelets: 288 10*3/uL (ref 150–400)
RBC: 4.43 MIL/uL (ref 3.87–5.11)
RDW: 14.6 % (ref 11.5–15.5)
WBC: 6.4 10*3/uL (ref 4.0–10.5)

## 2015-06-11 LAB — LIPASE, BLOOD: Lipase: 18 U/L (ref 11–51)

## 2015-06-11 MED ORDER — ONDANSETRON HCL 4 MG/2ML IJ SOLN
4.0000 mg | Freq: Once | INTRAMUSCULAR | Status: AC
  Administered 2015-06-11: 4 mg via INTRAVENOUS
  Filled 2015-06-11: qty 2

## 2015-06-11 MED ORDER — DEXTROSE 5 % IV SOLN
1.0000 g | Freq: Once | INTRAVENOUS | Status: AC
  Administered 2015-06-11: 1 g via INTRAVENOUS
  Filled 2015-06-11: qty 10

## 2015-06-11 MED ORDER — SODIUM CHLORIDE 0.9 % IV BOLUS (SEPSIS)
1000.0000 mL | Freq: Once | INTRAVENOUS | Status: AC
  Administered 2015-06-11: 1000 mL via INTRAVENOUS

## 2015-06-11 MED ORDER — TECHNETIUM TC 99M DIETHYLENETRIAME-PENTAACETIC ACID: 30.0000 | Freq: Once | INTRAVENOUS | Status: DC | PRN

## 2015-06-11 MED ORDER — POTASSIUM CHLORIDE 10 MEQ/100ML IV SOLN
10.0000 meq | INTRAVENOUS | Status: AC
  Administered 2015-06-11: 10 meq via INTRAVENOUS
  Filled 2015-06-11: qty 100

## 2015-06-11 MED ORDER — MORPHINE SULFATE (PF) 4 MG/ML IV SOLN
4.0000 mg | Freq: Once | INTRAVENOUS | Status: AC
  Administered 2015-06-11: 4 mg via INTRAVENOUS
  Filled 2015-06-11: qty 1

## 2015-06-11 MED ORDER — IOPAMIDOL (ISOVUE-300) INJECTION 61%
INTRAVENOUS | Status: AC
  Administered 2015-06-11: 100 mL
  Filled 2015-06-11: qty 100

## 2015-06-11 MED ORDER — TECHNETIUM TO 99M ALBUMIN AGGREGATED
4.0000 | Freq: Once | INTRAVENOUS | Status: AC | PRN
  Administered 2015-06-11: 4 via INTRAVENOUS

## 2015-06-11 NOTE — ED Notes (Signed)
Pt. Ambulated to restroom and became SOB with heart rate 120 and oxygen saturation 88%.

## 2015-06-11 NOTE — ED Notes (Signed)
Pt stable, ambulatory, states understanding of discharge instructions 

## 2015-06-11 NOTE — ED Notes (Signed)
Pt. Transported to nuclear medicine at this time.

## 2015-06-11 NOTE — ED Notes (Addendum)
Delay in potassium administration due to patient in nuclear medicine. Will start potassium once patient returns.

## 2015-06-11 NOTE — ED Provider Notes (Signed)
CSN: [id number]     Arrival date & time 06/11/15  1057 History   First MD Initiated Contact with Patient 06/11/15 1323     Chief Complaint  Patient presents with  . Abdominal Pain     (Consider location/radiation/quality/duration/timing/severity/associated sxs/prior Treatment) HPI Comments: 75 year old female with recent history of cholecystectomy and biliary stent placement who presents with abdominal pain. The patient had cholecystectomy and biliary stent placement in February of this she air. She reports initially feeling better but recently has had worsening upper abdominal pain which is epigastric and radiating to her right upper quadrant. She is scheduled to have the stent removed tomorrow by daughter reports that the pain has become so severe and constant and that she is unable to wait. She has had intermittent nausea and vomiting and has been unable to tolerate PO today. She has intermittently taken oxycodone without relief. She denies any diarrhea, fevers, cough/cold symptoms, or urinary symptoms.  Patient is a 75 y.o. female presenting with abdominal pain. The history is provided by the patient.  Abdominal Pain   Past Medical History  Diagnosis Date  . Compression fracture 07/24/09    T12; kyphoplasty  . Atrial fibrillation ([place]) 2011    Postop, spontaneous conversion to normal sinus after one hour  . Tobacco abuse   . History of echocardiogram 5/11    EF 65%   Past Surgical History  Procedure Laterality Date  . Back surgery    . Ercp N/A 04/14/2015    Procedure: ENDOSCOPIC RETROGRADE CHOLANGIOPANCREATOGRAPHY (ERCP) Biliary Sphincterotomy, 10x7 stent placement Dilated bilary system just not well seen;  Surgeon: Rogene Houston, MD;  Location: [facility];  Service: Endoscopy;  Laterality: N/A;  . Cholecystectomy N/A 04/25/2015    Procedure: LAPAROSCOPIC CHOLECYSTECTOMY WITH INTRAOPERATIVE CHOLANGIOGRAM;  Surgeon: Arta Bruce Kinsinger, MD;  Location: [facility];  Service: General;   Laterality: N/A;   Family History  Problem Relation Age of Onset  . Heart attack Father   . Stroke Father   . Breast cancer Sister   . Thyroid disease Neg Hx    Social History  Substance Use Topics  . Smoking status: Former Smoker -- 0.15 packs/day for 20 years  . Smokeless tobacco: None  . Alcohol Use: No   OB History    No data available     Review of Systems  Gastrointestinal: Positive for abdominal pain.   10 Systems reviewed and are negative for acute change except as noted in the HPI.    Allergies  Review of patient's allergies indicates no known allergies.  Home Medications   Prior to Admission medications   Medication Sig Start Date End Date Taking? Authorizing Provider  acetaminophen (TYLENOL) 325 MG tablet Take 2 tablets (650 mg total) by mouth every 6 (six) hours as needed for mild pain (or Fever >/= 101). 04/26/15  Yes Albertine Patricia, MD  aspirin EC 81 MG tablet Take 81 mg by mouth daily.   Yes Historical Provider, MD  methimazole (TAPAZOLE) 5 MG tablet Take 1 tablet (5 mg total) by mouth daily. 06/10/15  Yes Renato Shin, MD  metoprolol tartrate (LOPRESSOR) 25 MG tablet Take 0.5 tablets (12.5 mg total) by mouth daily. 05/27/15  Yes Renato Shin, MD  oxyCODONE (ROXICODONE) 5 MG immediate release tablet Take 1 tablet (5 mg total) by mouth every 4 (four) hours as needed for severe pain. 05/31/15  Yes Shawn C Joy, PA-C  oxyCODONE (OXY IR/ROXICODONE) 5 MG immediate release tablet Take 1 tablet (5 mg total)  by mouth every 6 (six) hours as needed for severe pain. 04/26/15   Silver Huguenin Elgergawy, MD   BP 109/70 mmHg  Pulse 110  Temp(Src) 98.5 F (36.9 C) (Rectal)  Resp 26  SpO2 92% Physical Exam  Constitutional: She is oriented to person, place, and time. She appears well-developed and well-nourished.  In mild distress due to pain  HENT:  Head: Normocephalic and atraumatic.  Moist mucous membranes  Eyes: Conjunctivae are normal. Pupils are equal, round, and  reactive to light.  Neck: Neck supple.  Cardiovascular: Normal rate, regular rhythm and normal heart sounds.   No murmur heard. Pulmonary/Chest: Effort normal and breath sounds normal.  Abdominal: Soft. Bowel sounds are normal. She exhibits no distension. There is tenderness. There is no rebound and no guarding.  Right upper quadrant and midepigastric tenderness to palpation  Musculoskeletal: She exhibits no edema.  Neurological: She is alert and oriented to person, place, and time.  Fluent speech  Skin: Skin is warm and dry.  Psychiatric: She has a normal mood and affect. Judgment normal.  Nursing note and vitals reviewed.   ED Course  Procedures (including critical care time) Labs Review Labs Reviewed  COMPREHENSIVE METABOLIC PANEL - Abnormal; Notable for the following:    Potassium 3.0 (*)    Chloride 94 (*)    Glucose, Bld 116 (*)    Creatinine, Ser 0.37 (*)    ALT 12 (*)    Alkaline Phosphatase 203 (*)    All other components within normal limits  CBC - Abnormal; Notable for the following:    Hemoglobin 10.9 (*)    HCT 35.4 (*)    MCH 24.6 (*)    All other components within normal limits  URINALYSIS, ROUTINE W REFLEX MICROSCOPIC (NOT AT [facility]) - Abnormal; Notable for the following:    Specific Gravity, Urine 1.037 (*)    Ketones, ur 15 (*)    All other components within normal limits  CULTURE, BLOOD (ROUTINE X 2)  CULTURE, BLOOD (ROUTINE X 2)  LIPASE, BLOOD    Imaging Review Dg Chest 2 View  06/11/2015  CLINICAL DATA:  Hypoxia. EXAM: CHEST  2 VIEW COMPARISON:  05/31/2015 FINDINGS: The cardiomediastinal silhouette is within normal limits. There is minimal scarring or atelectasis in the lung base. There is mild blunting of the posterior costophrenic angles without sizable pleural effusion identified. No pneumothorax. A biliary stent is partially visualized in the upper abdomen, there is excreted contrast in the renal collecting systems. Prior cement augmentation of  thoracic compression fractures noted. IMPRESSION: No active cardiopulmonary disease. Electronically Signed   By: Logan Bores M.D.   On: 06/11/2015 16:33   Ct Abdomen Pelvis W Contrast  06/11/2015  CLINICAL DATA:  75 year old female status post cholecystectomy in February with biliary stent placement. Severe right upper quadrant pain and difficulty eating. Subsequent encounter. EXAM: CT ABDOMEN AND PELVIS WITH CONTRAST TECHNIQUE: Multidetector CT imaging of the abdomen and pelvis was performed using the standard protocol following bolus administration of intravenous contrast. CONTRAST:  1 ISOVUE-300 IOPAMIDOL (ISOVUE-300) INJECTION 61% COMPARISON:  Intraoperative cholangiogram 04/25/2015. CT Abdomen and Pelvis 04/21/2015 FINDINGS: Negative lung bases.  No pericardial or pleural effusion. 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 No acute osseous abnormality identified. Stool ball in the rectum similar to the prior study. No pelvic free fluid. Diminutive uterus and adnexa. Unremarkable urinary bladder. Redundant sigmoid colon with occasional diverticula but no active inflammation. Decompressed left colon and splenic flexure. Redundant  but mostly decompressed transverse colon. Hepatic flexure within normal limits. Negative right colon aside from retained stool. Normal appendix. Negative terminal ileum. No dilated small bowel. Negative stomach. Pneumobilia with plastic appearing CBD stent in place. Compared to the February CT the stent has migrated distally. The proximal aspect of the stent now is at the porta hepatis. Central biliary ductal thickening and hyper enhancement is evident. Overall ductal size has not significantly changed. The gallbladder now is surgically absent. No pneumoperitoneum. No abdominal free fluid. Liver enhancement is stable. Portal venous system is patent. Spleen, pancreas and adrenal glands are stable and within normal limits. Kidneys are  stable and within normal limits ; several benign renal cysts re- demonstrated, larger on the left. Renal contrast excretion is symmetric. Aortoiliac calcified atherosclerosis noted. Major arterial structures are patent. No lymphadenopathy. IMPRESSION: 1. Chronic CBD stent with mild distal migration since February. The gallbladder is now surgically absent, no postoperative fluid collection identified. 2. Interval increased biliary ductal wall thickening and enhancement. Consider cholangitis. Ductal size has not significantly changed since February. 3. Otherwise stable abdomen and pelvis, no other acute findings. Electronically Signed   By: Genevie Ann M.D.   On: 06/11/2015 15:48   I have personally reviewed and evaluated these  lab results as part of my medical decision-making.   EKG Interpretation   Date/Time:  Wednesday June 11 2015 11:04:30 EDT Ventricular Rate:  112 PR Interval:  156 QRS Duration: 88 QT Interval:  334 QTC Calculation: 455 R Axis:   84 Text Interpretation:  Sinus tachycardia with frequent Premature  ventricular complexes Otherwise normal ECG PVCs new from previous,  otherwise no significant change Confirmed by LITTLE MD, RACHEL [phone]) on  06/11/2015 1:24:16 PM     Medications  potassium chloride 10 mEq in 100 mL IVPB (10 mEq Intravenous New Bag/Given 06/11/15 1631)  sodium chloride 0.9 % bolus 1,000 mL (0 mLs Intravenous Stopped 06/11/15 1445)  ondansetron (ZOFRAN) injection 4 mg (4 mg Intravenous Given 06/11/15 1405)  morphine 4 MG/ML injection 4 mg (4 mg Intravenous Given 06/11/15 1405)  iopamidol (ISOVUE-300) 61 % injection (100 mLs  Contrast Given 06/11/15 1507)  morphine 4 MG/ML injection 4 mg (4 mg Intravenous Given 06/11/15 1720)    MDM   Final diagnoses:  Hypoxia  Tachycardia  RUQ abdominal pain   Pt with recent cholecystectomy and stent placement presents with ongoing abdominal pain and intermittent vomiting. On exam, she was in mild distress due to pain. Vital signs  unremarkable. She had right upper quadrant and midepigastric tenderness. Labwork shows alkaline phosphatase elevated at 2 or 3 similar to her previous lab work, normal WBC, normal lipase. Obtained CT of abdomen and pelvis and gave the patient Zofran, morphine, and an IV fluid bolus.  Labwork notable for potassium of 3.0, stable CBC. Gave the patient IV potassium repletion. CT showed mild distal migration of CBD stent, increased biliary ductal wall thickening and enhancement. I discussed these findings with the patient's primary gastroenterologist, Dr. Laural Golden, who felt that these changes were nonspecific and did not suspect that she needed acute intervention given her reassuring lab work. During her ED stay, the patient ambulated and was noted to become hypoxic in the upper 80s on room air with tachycardia. She denies chest pain or shortness of breath but her tachycardia and mild oxygen requirement have persisted. Chest x-ray shows no acute infiltrate. She later reported to me that she had back surgery earlier this week, therefore differential includes PE. I discussed admission with  Dr. Broadus John, who requested a VQ scan prior to admission. I have ordered this study and we will admit patient after results are complete.  Sharlett Iles, MD 06/11/15 [phone]

## 2015-06-11 NOTE — ED Notes (Signed)
Patient here with ongoing abdominal pain. Patient thinks the pain is related to her gallbladder surgery and stent placement. Reports that she is losing weight and hurts

## 2015-06-11 NOTE — ED Provider Notes (Signed)
Care assumed from Dr. Rex Kras at shift change. The patient presented here with complaints of abdominal pain and vomiting. She has a common bile duct stent which was placed by her gastroenterologist in [place]. She has an appointment to have it removed tomorrow as she believes this is causing her symptoms. She began to feel worse, so for some reason she presented here rather than [facility]. Her workup today reveals no evidence for cholangitis. She appears nontoxic, afebrile, liver functions are normal and there is no leukocytosis.  Her care was signed out to me at shift change awaiting results of a VQ scan due to transient hypoxia and tachycardia with ambulation. This was performed and was negative. Upon reevaluation, the patient had no hypoxia and appeared quite comfortable. I discussed the case with Dr. Laural Golden, her GI doctor in [place] who will make arrangements for her to keep her appointment tomorrow to have this stent removed. I see no indication for admission at this time and believe she is appropriate for discharge.  Veryl Speak, MD 06/11/15 2026

## 2015-06-11 NOTE — ED Notes (Signed)
Pt. States she does not think she can urinate at this time. She sts she will try after the fluids.

## 2015-06-11 NOTE — Discharge Instructions (Signed)
Dr. Laural Golden will call you in the morning to inform you it time to show up for your procedure tomorrow.  If your symptoms significantly worsen between now and morning, please go to the [facility] emergency department.   Abdominal Pain, Adult Many things can cause abdominal pain. Usually, abdominal pain is not caused by a disease and will improve without treatment. It can often be observed and treated at home. Your health care provider will do a physical exam and possibly order blood tests and X-rays to help determine the seriousness of your pain. However, in many cases, more time must pass before a clear cause of the pain can be found. Before that point, your health care provider may not know if you need more testing or further treatment. HOME CARE INSTRUCTIONS Monitor your abdominal pain for any changes. The following actions may help to alleviate any discomfort you are experiencing:  Only take over-the-counter or prescription medicines as directed by your health care provider.  Do not take laxatives unless directed to do so by your health care provider.  Try a clear liquid diet (broth, tea, or water) as directed by your health care provider. Slowly move to a bland diet as tolerated. SEEK MEDICAL CARE IF:  You have unexplained abdominal pain.  You have abdominal pain associated with nausea or diarrhea.  You have pain when you urinate or have a bowel movement.  You experience abdominal pain that wakes you in the night.  You have abdominal pain that is worsened or improved by eating food.  You have abdominal pain that is worsened with eating fatty foods.  You have a fever. SEEK IMMEDIATE MEDICAL CARE IF:  Your pain does not go away within 2 hours.  You keep throwing up (vomiting).  Your pain is felt only in portions of the abdomen, such as the right side or the left lower portion of the abdomen.  You pass bloody or black tarry stools. MAKE SURE YOU:  Understand these  instructions.  Will watch your condition.  Will get help right away if you are not doing well or get worse.   This information is not intended to replace advice given to you by your health care provider. Make sure you discuss any questions you have with your health care provider.   Document Released: 12/02/2004 Document Revised: 11/13/2014 Document Reviewed: 11/01/2012 Elsevier Interactive Patient Education [employer].

## 2015-06-11 NOTE — ED Notes (Signed)
EDP at bedside  

## 2015-06-12 ENCOUNTER — Ambulatory Visit (HOSPITAL_COMMUNITY): Payer: Medicare Other | Admitting: Anesthesiology

## 2015-06-12 ENCOUNTER — Ambulatory Visit (HOSPITAL_COMMUNITY)
Admission: RE | Admit: 2015-06-12 | Discharge: 2015-06-12 | Disposition: A | Discharge status: Home / Self Care | Payer: Medicare Other | Source: Ambulatory Visit | Attending: Internal Medicine | Admitting: Internal Medicine

## 2015-06-12 ENCOUNTER — Ambulatory Visit (HOSPITAL_COMMUNITY): Payer: Medicare Other

## 2015-06-12 ENCOUNTER — Encounter (HOSPITAL_COMMUNITY)
Admission: RE | Disposition: A | Discharge status: Home / Self Care | Payer: Self-pay | Source: Ambulatory Visit | Attending: Internal Medicine

## 2015-06-12 ENCOUNTER — Encounter (HOSPITAL_COMMUNITY): Payer: Self-pay | Admitting: *Deleted

## 2015-06-12 DIAGNOSIS — K831 Obstruction of bile duct: Secondary | ICD-10-CM | POA: Diagnosis not present

## 2015-06-12 DIAGNOSIS — K210 Gastro-esophageal reflux disease with esophagitis: Secondary | ICD-10-CM | POA: Insufficient documentation

## 2015-06-12 DIAGNOSIS — R109 Unspecified abdominal pain: Secondary | ICD-10-CM | POA: Insufficient documentation

## 2015-06-12 DIAGNOSIS — Z9049 Acquired absence of other specified parts of digestive tract: Secondary | ICD-10-CM | POA: Diagnosis not present

## 2015-06-12 DIAGNOSIS — R748 Abnormal levels of other serum enzymes: Secondary | ICD-10-CM | POA: Diagnosis not present

## 2015-06-12 DIAGNOSIS — R1030 Lower abdominal pain, unspecified: Secondary | ICD-10-CM | POA: Diagnosis not present

## 2015-06-12 DIAGNOSIS — Z7982 Long term (current) use of aspirin: Secondary | ICD-10-CM | POA: Diagnosis not present

## 2015-06-12 DIAGNOSIS — Z79899 Other long term (current) drug therapy: Secondary | ICD-10-CM | POA: Diagnosis not present

## 2015-06-12 DIAGNOSIS — Z4659 Encounter for fitting and adjustment of other gastrointestinal appliance and device: Secondary | ICD-10-CM | POA: Diagnosis not present

## 2015-06-12 DIAGNOSIS — E0590 Thyrotoxicosis, unspecified without thyrotoxic crisis or storm: Secondary | ICD-10-CM | POA: Diagnosis not present

## 2015-06-12 DIAGNOSIS — R1010 Upper abdominal pain, unspecified: Secondary | ICD-10-CM | POA: Diagnosis not present

## 2015-06-12 DIAGNOSIS — K838 Other specified diseases of biliary tract: Secondary | ICD-10-CM | POA: Diagnosis not present

## 2015-06-12 DIAGNOSIS — R634 Abnormal weight loss: Secondary | ICD-10-CM | POA: Insufficient documentation

## 2015-06-12 DIAGNOSIS — Z87891 Personal history of nicotine dependence: Secondary | ICD-10-CM | POA: Diagnosis not present

## 2015-06-12 DIAGNOSIS — R112 Nausea with vomiting, unspecified: Secondary | ICD-10-CM | POA: Insufficient documentation

## 2015-06-12 DIAGNOSIS — Z4689 Encounter for fitting and adjustment of other specified devices: Secondary | ICD-10-CM

## 2015-06-12 DIAGNOSIS — K810 Acute cholecystitis: Secondary | ICD-10-CM

## 2015-06-12 HISTORY — PX: ERCP: SHX5425

## 2015-06-12 HISTORY — PX: STENT REMOVAL: SHX6421

## 2015-06-12 HISTORY — PX: ESOPHAGOGASTRODUODENOSCOPY: SHX5428

## 2015-06-12 SURGERY — ERCP, WITH INTERVENTION IF INDICATED
Anesthesia: General

## 2015-06-12 MED ORDER — ONDANSETRON HCL 4 MG/2ML IJ SOLN
4.0000 mg | Freq: Once | INTRAMUSCULAR | Status: AC
  Administered 2015-06-12: 4 mg via INTRAVENOUS

## 2015-06-12 MED ORDER — DEXTROSE 5 % IV SOLN
2.0000 g | INTRAVENOUS | Status: DC
  Filled 2015-06-12: qty 20

## 2015-06-12 MED ORDER — ONDANSETRON HCL 4 MG/2ML IJ SOLN: 4.0000 mg | Freq: Once | INTRAMUSCULAR | Status: DC | PRN

## 2015-06-12 MED ORDER — LACTATED RINGERS IV SOLN
INTRAVENOUS | Status: DC
  Administered 2015-06-12 (×2): via INTRAVENOUS

## 2015-06-12 MED ORDER — CEFAZOLIN SODIUM-DEXTROSE 2-4 GM/100ML-% IV SOLN
INTRAVENOUS | Status: AC
  Filled 2015-06-12: qty 100

## 2015-06-12 MED ORDER — FENTANYL CITRATE (PF) 100 MCG/2ML IJ SOLN: 25.0000 ug | INTRAMUSCULAR | Status: DC | PRN

## 2015-06-12 MED ORDER — MIDAZOLAM HCL 2 MG/2ML IJ SOLN
INTRAMUSCULAR | Status: AC
  Filled 2015-06-12: qty 2

## 2015-06-12 MED ORDER — GLUCAGON HCL RDNA (DIAGNOSTIC) 1 MG IJ SOLR
INTRAMUSCULAR | Status: AC
  Filled 2015-06-12: qty 2

## 2015-06-12 MED ORDER — PROPOFOL 10 MG/ML IV BOLUS
INTRAVENOUS | Status: DC | PRN
  Administered 2015-06-12: 100 mg via INTRAVENOUS

## 2015-06-12 MED ORDER — CEFAZOLIN SODIUM-DEXTROSE 2-4 GM/100ML-% IV SOLN: 2.0000 g | Freq: Once | INTRAVENOUS | Status: DC

## 2015-06-12 MED ORDER — ROCURONIUM BROMIDE 100 MG/10ML IV SOLN
INTRAVENOUS | Status: DC | PRN
  Administered 2015-06-12: 5 mg via INTRAVENOUS

## 2015-06-12 MED ORDER — FENTANYL CITRATE (PF) 250 MCG/5ML IJ SOLN
INTRAMUSCULAR | Status: AC
  Filled 2015-06-12: qty 5

## 2015-06-12 MED ORDER — SUCCINYLCHOLINE CHLORIDE 20 MG/ML IJ SOLN
INTRAMUSCULAR | Status: DC | PRN
  Administered 2015-06-12: 80 mg via INTRAVENOUS

## 2015-06-12 MED ORDER — ONDANSETRON HCL 4 MG PO TABS: 4.0000 mg | ORAL_TABLET | Freq: Two times a day (BID) | ORAL | Status: DC | PRN

## 2015-06-12 MED ORDER — ONDANSETRON HCL 4 MG/2ML IJ SOLN
INTRAMUSCULAR | Status: AC
Start: 2015-06-12 — End: 2015-06-12
  Filled 2015-06-12: qty 2

## 2015-06-12 MED ORDER — SODIUM CHLORIDE 0.9 % IV SOLN
INTRAVENOUS | Status: DC | PRN
  Administered 2015-06-12: 100 mL

## 2015-06-12 MED ORDER — MIDAZOLAM HCL 2 MG/2ML IJ SOLN
1.0000 mg | INTRAMUSCULAR | Status: DC | PRN
  Administered 2015-06-12: 2 mg via INTRAVENOUS

## 2015-06-12 MED ORDER — FENTANYL CITRATE (PF) 250 MCG/5ML IJ SOLN
INTRAMUSCULAR | Status: DC | PRN
  Administered 2015-06-12 (×2): 50 ug via INTRAVENOUS

## 2015-06-12 MED ORDER — BUTAMBEN-TETRACAINE-BENZOCAINE 2-2-14 % EX AERO
2.0000 | INHALATION_SPRAY | Freq: Once | CUTANEOUS | Status: AC
  Administered 2015-06-12: 2 via TOPICAL

## 2015-06-12 MED ORDER — STERILE WATER FOR IRRIGATION IR SOLN
Status: DC | PRN
  Administered 2015-06-12: 1000 mL

## 2015-06-12 MED ORDER — PANTOPRAZOLE SODIUM 40 MG PO TBEC: 40.0000 mg | DELAYED_RELEASE_TABLET | Freq: Two times a day (BID) | ORAL | Status: DC

## 2015-06-12 MED ORDER — SODIUM CHLORIDE 0.9 % IV SOLN
INTRAVENOUS | Status: AC
  Filled 2015-06-12: qty 100

## 2015-06-12 MED ORDER — PROPOFOL 10 MG/ML IV BOLUS
INTRAVENOUS | Status: AC
  Filled 2015-06-12: qty 20

## 2015-06-12 MED ORDER — LIDOCAINE HCL 1 % IJ SOLN
INTRAMUSCULAR | Status: DC | PRN
  Administered 2015-06-12: 40 mg via INTRADERMAL

## 2015-06-12 NOTE — Anesthesia Postprocedure Evaluation (Signed)
Anesthesia Post Note  Patient: TIMIA MWANGI  Procedure(s) Performed: Procedure(s) (LRB): ENDOSCOPIC RETROGRADE CHOLANGIOPANCREATOGRAPHY (ERCP) (N/A) DIAGNOSTIC ESOPHAGOGASTRODUODENOSCOPY (EGD) (N/A) STENT REMOVAL   Patient location during evaluation: PACU Anesthesia Type: General Level of consciousness: awake and alert and oriented Pain management: pain level controlled Vital Signs Assessment: post-procedure vital signs reviewed and stable Respiratory status: spontaneous breathing Cardiovascular status: blood pressure returned to baseline Postop Assessment: no signs of nausea or vomiting Anesthetic complications: no    Last Vitals:  Filed Vitals:   06/12/15 0930 06/12/15 0941  BP: 116/71 120/69  Pulse: 101 101  Temp:    Resp:  20    Last Pain:  Filed Vitals:   06/12/15 1004  PainSc: 0-No pain                 Mirra Basilio

## 2015-06-12 NOTE — Op Note (Signed)
[facility] Patient Name: Courtney Arnold Procedure Date: 06/12/2015 9:20 AM MRN: [id number] Date of Birth: 1940-09-09 Attending MD: Hildred Laser , MD CSN: [id number] Age: 75 Admit Type: Outpatient Procedure:                Upper GI endoscopy Indications:              patient is 75 year old Caucasian female who                            presents with nausea vomiting abdominal pain and                            continued weight loss. She is also having                            intermittent heartburn. She has history of bile                            duct stricture and is here for stent removal. She                            is undergoing diagnostic EGD. Providers:                Hildred Laser, MD, Gwenlyn Fudge, RN, Randa Spike, Technician Referring MD:             [name] (Referring MD) Medicines:                General Anesthesia Complications:            No immediate complications. Estimated Blood Loss:     Estimated blood loss: none. Procedure:                Pre-Anesthesia Assessment:                           - Prior to the procedure, a History and Physical                            was performed, and patient medications and                            allergies were reviewed. The patient's tolerance of                            previous anesthesia was also reviewed. The risks                            and benefits of the procedure and the sedation                            options and risks were discussed with the patient.  All questions were answered, and informed consent                            was obtained. Prior Anticoagulants: The patient                            last took aspirin 7 days prior to the procedure.                            ASA Grade Assessment: III - A patient with severe                            systemic disease. After reviewing the risks and                            benefits,  the patient was deemed in satisfactory                            condition to undergo the procedure.                           - Prior to the procedure, a History and Physical                            was performed, and patient medications and                            allergies were reviewed. The patient's tolerance of                            previous anesthesia was also reviewed. The risks                            and benefits of the procedure and the sedation                            options and risks were discussed with the patient.                            All questions were answered, and informed consent                            was obtained. Prior Anticoagulants: The patient                            last took aspirin 7 days prior to the procedure.                            ASA Grade Assessment: III - A patient with severe                            systemic disease. After reviewing the risks and  benefits, the patient was deemed in satisfactory                            condition to undergo the procedure.                           After obtaining informed consent, the endoscope was                            passed under direct vision. Throughout the                            procedure, the patient's blood pressure, pulse, and                            oxygen saturations were monitored continuously. The                            EG-[id number]) scope was introduced through the                            mouth, and advanced to the second part of duodenum.                            The upper GI endoscopy was accomplished without                            difficulty. The patient tolerated the procedure                            well. Findings:      LA Grade B (one or more mucosal breaks greater than 5 mm, not extending       between the tops of two mucosal folds) esophagitis with no bleeding was       found 35 cm from the  incisors.      The Z-line was irregular and was found 40 cm from the incisors.      Patchy mildly erythematous mucosa without bleeding was found in the       gastric antrum.      Patchy mildly erythematous mucosa without active bleeding and with no       stigmata of bleeding was found in the first portion of the duodenum.      The second portion of the duodenum was normal.      A previously placed plastic stent was seen in the second portion of the       duodenum. Impression:               - LA Grade B reflux esophagitis.                           - Z-line irregular, 40 cm from the incisors.                           - Erythematous mucosa in the antrum.                           -  Erythematous duodenopathy.                           - Normal second portion of the duodenum.                           - Plastic stent in the duodenum.                           - No specimens collected. Moderate Sedation:      Per Anesthesia Care Recommendation:           - Perform an ERCP today.                           - Clear liquid diet today.                           - Continue present medications.                           - Perform an H. pylori serology today. Procedure Code(s):        --- Professional ---                           [phone], Esophagogastroduodenoscopy, flexible,                            transoral; diagnostic, including collection of                            specimen(s) by brushing or washing, when performed                            (separate procedure) Diagnosis Code(s):        --- Professional ---                           K21.0, Gastro-esophageal reflux disease with                            esophagitis                           K22.8, Other specified diseases of esophagus                           K31.89, Other diseases of stomach and duodenum CPT copyright 2016 American Medical Association. All rights reserved. The codes documented in this report are preliminary and upon  coder review may  be revised to meet current compliance requirements. Hildred Laser, MD Hildred Laser, MD 06/12/2015 9:38:16 AM This report has been signed electronically. Number of Addenda: 0

## 2015-06-12 NOTE — Anesthesia Procedure Notes (Signed)
Procedure Name: Intubation Date/Time: 06/12/2015 7:53 AM Performed by: Tressie Stalker E Pre-anesthesia Checklist: Patient identified, Patient being monitored, Timeout performed, Emergency Drugs available and Suction available Patient Re-evaluated:Patient Re-evaluated prior to inductionOxygen Delivery Method: Circle System Utilized Preoxygenation: Pre-oxygenation with 100% oxygen Intubation Type: IV induction, Rapid sequence and Cricoid Pressure applied Ventilation: Mask ventilation without difficulty Laryngoscope Size: Mac and 3 Grade View: Grade I Tube type: Oral Tube size: 7.0 mm Number of attempts: 1 Airway Equipment and Method: Stylet Placement Confirmation: ETT inserted through vocal cords under direct vision,  positive ETCO2 and breath sounds checked- equal and bilateral Secured at: 21 cm Tube secured with: Tape Dental Injury: Teeth and Oropharynx as per pre-operative assessment

## 2015-06-12 NOTE — Transfer of Care (Signed)
Immediate Anesthesia Transfer of Care Note  Patient: Courtney Arnold  Procedure(s) Performed: Procedure(s): ENDOSCOPIC RETROGRADE CHOLANGIOPANCREATOGRAPHY (ERCP) (N/A) DIAGNOSTIC ESOPHAGOGASTRODUODENOSCOPY (EGD) (N/A) STENT REMOVAL   Patient Location: PACU  Anesthesia Type:General  Level of Consciousness: awake  Airway & Oxygen Therapy: Patient Spontanous Breathing and Patient connected to face mask oxygen  Post-op Assessment: Report given to RN and Post -op Vital signs reviewed and stable  Post vital signs: Reviewed and stable  Last Vitals:  Filed Vitals:   06/12/15 0730 06/12/15 0735  BP: 143/76 124/63  Temp:    Resp: 24 27    Complications: No apparent anesthesia complications

## 2015-06-12 NOTE — Op Note (Signed)
[facility] Patient Name: Courtney Arnold Procedure Date: 06/12/2015 7:12 AM MRN: [id number] Date of Birth: [birth year] Attending MD: Hildred Laser , MD CSN: [id number] Age: 75 Admit Type: Outpatient Procedure:                ERCP Indications:              Biliary stent removal Providers:                Hildred Laser, MD, Gwenlyn Fudge, RN, Randa Spike, Technician Referring MD:             Halford Chessman (Referring MD) Medicines:                General Anesthesia Complications:            No immediate complications. Estimated Blood Loss:     Estimated blood loss: none. Procedure:                Pre-Anesthesia Assessment:                           - Prior to the procedure, a History and Physical                            was performed, and patient medications and                            allergies were reviewed. The patient's tolerance of                            previous anesthesia was also reviewed. The risks                            and benefits of the procedure and the sedation                            options and risks were discussed with the patient.                            All questions were answered, and informed consent                            was obtained. Prior Anticoagulants: The patient                            last took aspirin 7 days prior to the procedure.                            ASA Grade Assessment: III - A patient with severe                            systemic disease. After reviewing the risks and  benefits, the patient was deemed in satisfactory                            condition to undergo the procedure.                           After obtaining informed consent, the scope was                            passed under direct vision. Throughout the                            procedure, the patient's blood pressure, pulse, and                            oxygen saturations were monitored  continuously. The                            [id number]) scope was introduced through the                            mouth, and used to inject contrast into and used to                            inject contrast into the bile duct. The [id number]                            [id number]) scope was introduced through the and used                            to inject contrast into. The ERCP was accomplished                            with ease. The patient tolerated the procedure well. Scope In: 8:09:08 AM Scope Out: 8:26:38 AM Total Procedure Duration: 0 hours 17 minutes 30 seconds  Findings:      The scout film was normal. One stent was removed from the common bile       duct using a snare. The stent was found to be patent via the water       column test. The major papilla was normal. The bile duct was deeply       cannulated. Contrast was injected. The middle third of the main bile       duct and upper third of the main bile duct were moderately dilated. The       largest diameter was 13 mm. The lower third of the main bile duct       contained a single localized stenosis 3 mm in length. Impression:               - The major papilla appeared normal.                           - Biliary stent removed under fluoroscopic control.                           -  A localized biliary stricture was found. The                            stricture was indeterminate. It was not stented                            because patient refused.                           - The upper third of the main bile duct and middle                            third of the main bile duct were moderately dilated.                           - One stent was removed from the common bile duct. Moderate Sedation:      Per Anesthesia Care Recommendation:           - Patient has a contact number available for                            emergencies. The signs and symptoms of potential                            delayed  complications were discussed with the                            patient. Return to normal activities tomorrow.                            Written discharge instructions were provided to the                            patient.                           - Clear liquid diet for 12 hours.                           - Continue present medications.                           - Perform an H. pylori serology, IgG4 and CA-19-9. Procedure Code(s):        --- Professional ---                           [id number], Endoscopic retrograde                            cholangiopancreatography (ERCP); with removal of                            foreign body(s) or stent(s) from biliary/pancreatic  duct(s) Diagnosis Code(s):        --- Professional ---                           K83.1, Obstruction of bile duct                           Z46.59, Encounter for fitting and adjustment of                            other gastrointestinal appliance and device                           K83.8, Other specified diseases of biliary tract CPT copyright 2016 American Medical Association. All rights reserved. The codes documented in this report are preliminary and upon coder review may  be revised to meet current compliance requirements. Hildred Laser, MD Hildred Laser, MD 06/12/2015 9:07:33 AM This report has been signed electronically. Number of Addenda: 0

## 2015-06-12 NOTE — Discharge Instructions (Signed)
Clear liquids for 12 hours then usual diet. Resume other medications as before. Pantoprazole 40 mg by mouth 30 minutes before breakfast and evening meal daily. Can take first dose before lunch today. Ondansetron 4 mg by mouth twice daily as needed for nausea. Patient will call with results of pending blood work.   Endoscopic Retrograde Cholangiopancreatography (ERCP), Care After Refer to this sheet in the next few weeks. These instructions provide you with information on caring for yourself after your procedure. Your health care provider may also give you more specific instructions. Your treatment has been planned according to current medical practices, but problems sometimes occur. Call your health care provider if you have any problems or questions after your procedure.  WHAT TO EXPECT AFTER THE PROCEDURE  After your procedure, it is typical to feel:   Soreness in your throat.   Sick to your stomach (nauseous).   Bloated.  Dizzy.   Fatigued. HOME CARE INSTRUCTIONS  Have a friend or family member stay with you for the first 24 hours after your procedure.  Start taking your usual medicines and eating normally as soon as you feel well enough to do so or as directed by your health care provider. SEEK MEDICAL CARE IF:  You have abdominal pain.   You develop signs of infection, such as:   Chills.   Feeling unwell.  SEEK IMMEDIATE MEDICAL CARE IF:  You have difficulty swallowing.  You have worsening throat, chest, or abdominal pain.  You vomit.  You have bloody or very black stools.  You have a fever.   This information is not intended to replace advice given to you by your health care provider. Make sure you discuss any questions you have with your health care provider.   Document Released: 12/13/2012 Document Reviewed: 12/13/2012 Elsevier Interactive Patient Education [employer].

## 2015-06-12 NOTE — Anesthesia Preprocedure Evaluation (Signed)
Anesthesia Evaluation  Patient identified by MRN, date of birth, ID band Patient awake    Reviewed: Allergy & Precautions, NPO status , Patient's Chart, lab work & pertinent test results  Airway Mallampati: II  TM Distance: >3 FB     Dental  (+) Edentulous Upper, Edentulous Lower   Pulmonary former smoker,    Pulmonary exam normal        Cardiovascular Normal cardiovascular exam+ dysrhythmias Atrial Fibrillation      Neuro/Psych PSYCHIATRIC DISORDERS Anxiety    GI/Hepatic abd pain with N&V today   Endo/Other  Hyperthyroidism   Renal/GU      Musculoskeletal   Abdominal   Peds  Hematology   Anesthesia Other Findings   Reproductive/Obstetrics                             Anesthesia Physical Anesthesia Plan  ASA: III  Anesthesia Plan: General   Post-op Pain Management:    Induction: Intravenous, Rapid sequence and Cricoid pressure planned  Airway Management Planned: Oral ETT  Additional Equipment:   Intra-op Plan:   Post-operative Plan:   Informed Consent: I have reviewed the patients History and Physical, chart, labs and discussed the procedure including the risks, benefits and alternatives for the proposed anesthesia with the patient or authorized representative who has indicated his/her understanding and acceptance.   Dental advisory given  Plan Discussed with: CRNA  Anesthesia Plan Comments:         Anesthesia Quick Evaluation

## 2015-06-12 NOTE — H&P (Addendum)
Courtney Arnold is an 75 y.o. female.   Chief Complaint: Patient is here for ERCP and stent removal but will undergo diagnostic EGD first. HPI: Patient is 75 year old Caucasian female who was hospitalized 2 months ago with abdominal pain nausea and vomiting. Her LFTs were abnormal. Imaging studies revealed distended gallbladder cholelithiasis and dilated biliary system. ERCP revealed papillary stenosis/CBD stricture. Sphincterotomy was performed and stent was placed. About 6 weeks ago she went on to have cholecystectomy. She did have intraoperative cholangiogram which revealed distal CBD stricture. Patient states she's been having postprandial lower abdominal pain for the last 3 weeks. She's had nausea and vomiting within few minutes of eating. She is also noted epigastric and chest pain. She says she's losing weight. She denies fever chills melena or rectal bleeding or hematemesis. She is convinced her symptoms are due to stent in place. She wants to have it removed no matter what. 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 She was seen in emergency room at [facility] yesterday. She had multiple studies including CT VQ scan as well as blood work. She was discharged and advised to follow-up with me. Abdominopelvic CT revealed biliary stent in place with distal migration but proximal and still in the bile duct. Pneumobilia implying stent patency. There was decrease in thickening to bile duct but there was still some enhancement. No other abnormalities noted. Also noted to have chronic osteopenia with chronic T12 and L4 compression fractures.  Past Medical History  Diagnosis Date  . Compression fracture 07/24/09    T12; kyphoplasty  . Atrial fibrillation ([place]) 2011    Postop, spontaneous conversion to normal sinus after one hour  . Tobacco abuse   . History of echocardiogram 5/11    EF 65%    Past Surgical History  Procedure Laterality Date  . Back surgery    . Ercp N/A  04/14/2015    Procedure: ENDOSCOPIC RETROGRADE CHOLANGIOPANCREATOGRAPHY (ERCP) Biliary Sphincterotomy, 10x7 stent placement Dilated bilary system just not well seen;  Surgeon: Rogene Houston, MD;  Location: [facility];  Service: Endoscopy;  Laterality: N/A;  . Cholecystectomy N/A 04/25/2015    Procedure: LAPAROSCOPIC CHOLECYSTECTOMY WITH INTRAOPERATIVE CHOLANGIOGRAM;  Surgeon: Arta Bruce Kinsinger, MD;  Location: [facility];  Service: General;  Laterality: N/A;  . Back surgery  06/06/2015    Family History  Problem Relation Age of Onset  . Heart attack Father   . Stroke Father   . Breast cancer Sister   . Thyroid disease Neg Hx    Social History:  reports that she has quit smoking. She does not have any smokeless tobacco history on file. She reports that she does not drink alcohol or use illicit drugs.  Allergies: No Known Allergies  Medications Prior to Admission  Medication Sig Dispense Refill  . acetaminophen (TYLENOL) 325 MG tablet Take 2 tablets (650 mg total) by mouth every 6 (six) hours as needed for mild pain (or Fever >/= 101).    Marland Kitchen aspirin EC 81 MG tablet Take 81 mg by mouth daily.    . methimazole (TAPAZOLE) 5 MG tablet Take 1 tablet (5 mg total) by mouth daily. 30 tablet 3  . metoprolol tartrate (LOPRESSOR) 25 MG tablet Take 0.5 tablets (12.5 mg total) by mouth daily. 30 tablet 11  . oxyCODONE (OXY IR/ROXICODONE) 5 MG immediate release tablet Take 1 tablet (5 mg total) by mouth every 6 (six) hours as needed for severe pain. 30 tablet 0  . oxyCODONE (ROXICODONE) 5 MG  immediate release tablet Take 1 tablet (5 mg total) by mouth every 4 (four) hours as needed for severe pain. 15 tablet 0    Results for orders placed or performed during the hospital encounter of 06/11/15 (from the past 48 hour(s))  Lipase, blood     Status: None   Collection Time: 06/11/15 11:32 AM  Result Value Ref Range   Lipase 18 11 - 51 U/L  Comprehensive metabolic panel     Status: Abnormal   Collection Time:  06/11/15 11:32 AM  Result Value Ref Range   Sodium 137 135 - 145 mmol/L   Potassium 3.0 (L) 3.5 - 5.1 mmol/L   Chloride 94 (L) 101 - 111 mmol/L   CO2 30 22 - 32 mmol/L   Glucose, Bld 116 (H) 65 - 99 mg/dL   BUN 12 6 - 20 mg/dL   Creatinine, Ser 0.37 (L) 0.44 - 1.00 mg/dL   Calcium 9.6 8.9 - 10.3 mg/dL   Total Protein 7.1 6.5 - 8.1 g/dL   Albumin 3.5 3.5 - 5.0 g/dL   AST 20 15 - 41 U/L   ALT 12 (L) 14 - 54 U/L   Alkaline Phosphatase 203 (H) 38 - 126 U/L   Total Bilirubin 0.5 0.3 - 1.2 mg/dL   GFR calc non Af Amer >60 >60 mL/min   GFR calc Af Amer >60 >60 mL/min    Comment: (NOTE) The eGFR has been calculated using the CKD EPI equation. This calculation has not been validated in all clinical situations. eGFR's persistently <60 mL/min signify possible Chronic Kidney Disease.    Anion gap 13 5 - 15  CBC     Status: Abnormal   Collection Time: 06/11/15 11:32 AM  Result Value Ref Range   WBC 6.4 4.0 - 10.5 K/uL   RBC 4.43 3.87 - 5.11 MIL/uL   Hemoglobin 10.9 (L) 12.0 - 15.0 g/dL   HCT 35.4 (L) 36.0 - 46.0 %   MCV 79.9 78.0 - 100.0 fL   MCH 24.6 (L) 26.0 - 34.0 pg   MCHC 30.8 30.0 - 36.0 g/dL   RDW 14.6 11.5 - 15.5 %   Platelets 288 150 - 400 K/uL  Urinalysis, Routine w reflex microscopic (not at [facility])     Status: Abnormal   Collection Time: 06/11/15  3:48 PM  Result Value Ref Range   Color, Urine YELLOW YELLOW   APPearance CLEAR CLEAR   Specific Gravity, Urine 1.037 (H) 1.005 - 1.030   pH 7.0 5.0 - 8.0   Glucose, UA NEGATIVE NEGATIVE mg/dL   Hgb urine dipstick NEGATIVE NEGATIVE   Bilirubin Urine NEGATIVE NEGATIVE   Ketones, ur 15 (A) NEGATIVE mg/dL   Protein, ur NEGATIVE NEGATIVE mg/dL   Nitrite NEGATIVE NEGATIVE   Leukocytes, UA NEGATIVE NEGATIVE    Comment: MICROSCOPIC NOT DONE ON URINES WITH NEGATIVE PROTEIN, BLOOD, LEUKOCYTES, NITRITE, OR GLUCOSE <1000 mg/dL.    ROS  Temperature 98 F (36.7 C), temperature source Oral. Physical Exam  Constitutional:   Well-developed thin Caucasian female in no acute distress. Appears very anxious.  HENT:  Mouth/Throat: Oropharynx is clear and moist.  Eyes: No scleral icterus.  She has bilateral ex-opthalmos.  Neck: No thyromegaly present.  Cardiovascular:  Occasional irregularity noted. Normal S1 and S2. Faint systolic ejection murmur at left sternal border.  Respiratory: Effort normal and breath sounds normal.  GI:  Abdomen is symmetrical and soft with fullness and mild tenderness at midepigastric region. No organomegaly or masses.  Musculoskeletal: She exhibits  no edema.  Lymphadenopathy:    She has no cervical adenopathy.  Neurological: She is alert.  Skin: Skin is warm and dry.     Assessment/Plan Recurrent nausea vomiting upper and lower abdominal pain. 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 Her transaminases and bilirubin are normal. Alkaline phosphatase is mildly elevated and that could be from bone disease or from one of her medications are thyroid disease. I do not believe her symptoms have anything to do with stent appears to be patent. Need to rule out peptic ulcer disease. She could all be also be having abdominal angina or side effects secondary to methimazole. Undergo diagnostic EGD with ERCP with stent removal and cholangiogram.  REHMAN,NAJEEB U, MD 06/12/2015, 7:28 AM

## 2015-06-13 LAB — IGG 4: IgG, Subclass 4: 11 mg/dL (ref 1–291)

## 2015-06-13 LAB — CANCER ANTIGEN 19-9: CA 19-9: 10 U/mL (ref 0–35)

## 2015-06-16 LAB — CULTURE, BLOOD (ROUTINE X 2): Culture: NO GROWTH

## 2015-06-24 ENCOUNTER — Other Ambulatory Visit (HOSPITAL_COMMUNITY): Payer: Medicare Other

## 2015-06-25 ENCOUNTER — Other Ambulatory Visit: Payer: Self-pay | Admitting: Endocrinology

## 2015-06-25 ENCOUNTER — Encounter: Payer: Self-pay | Admitting: Endocrinology

## 2015-06-25 DIAGNOSIS — E0500 Thyrotoxicosis with diffuse goiter without thyrotoxic crisis or storm: Secondary | ICD-10-CM | POA: Insufficient documentation

## 2015-06-26 ENCOUNTER — Encounter (HOSPITAL_COMMUNITY): Payer: Self-pay | Admitting: Internal Medicine

## 2015-06-27 ENCOUNTER — Encounter (HOSPITAL_COMMUNITY): Admission: RE | Payer: Self-pay | Source: Ambulatory Visit

## 2015-06-27 ENCOUNTER — Ambulatory Visit (HOSPITAL_COMMUNITY): Admission: RE | Admit: 2015-06-27 | Payer: Medicare Other | Source: Ambulatory Visit | Admitting: Internal Medicine

## 2015-06-27 SURGERY — ERCP, WITH INTERVENTION IF INDICATED
Anesthesia: Moderate Sedation

## 2015-06-30 ENCOUNTER — Telehealth (INDEPENDENT_AMBULATORY_CARE_PROVIDER_SITE_OTHER): Payer: Self-pay | Admitting: *Deleted

## 2015-06-30 DIAGNOSIS — R945 Abnormal results of liver function studies: Principal | ICD-10-CM

## 2015-06-30 DIAGNOSIS — R7989 Other specified abnormal findings of blood chemistry: Secondary | ICD-10-CM

## 2015-06-30 NOTE — Telephone Encounter (Signed)
Per Dr.Rehman the patient will need to have labs drawn prior to OV.

## 2015-07-01 ENCOUNTER — Encounter (INDEPENDENT_AMBULATORY_CARE_PROVIDER_SITE_OTHER): Payer: Self-pay | Admitting: Internal Medicine

## 2015-07-01 ENCOUNTER — Other Ambulatory Visit (HOSPITAL_COMMUNITY)
Admission: RE | Admit: 2015-07-01 | Discharge: 2015-07-01 | Disposition: A | Discharge status: Home / Self Care | Payer: Medicare Other | Source: Ambulatory Visit | Attending: Internal Medicine | Admitting: Internal Medicine

## 2015-07-01 ENCOUNTER — Ambulatory Visit (INDEPENDENT_AMBULATORY_CARE_PROVIDER_SITE_OTHER): Payer: Medicare Other | Admitting: Internal Medicine

## 2015-07-01 VITALS — BP 108/68 | HR 74 | Temp 97.8°F | Resp 18 | Ht 62.0 in | Wt 103.7 lb

## 2015-07-01 DIAGNOSIS — K831 Obstruction of bile duct: Secondary | ICD-10-CM

## 2015-07-01 DIAGNOSIS — R1084 Generalized abdominal pain: Secondary | ICD-10-CM | POA: Diagnosis not present

## 2015-07-01 DIAGNOSIS — R634 Abnormal weight loss: Secondary | ICD-10-CM

## 2015-07-01 LAB — HEPATIC FUNCTION PANEL
ALT: 12 U/L — ABNORMAL LOW (ref 14–54)
AST: 16 U/L (ref 15–41)
Albumin: 4 g/dL (ref 3.5–5.0)
Alkaline Phosphatase: 129 U/L — ABNORMAL HIGH (ref 38–126)
Bilirubin, Direct: 0.1 mg/dL (ref 0.1–0.5)
Indirect Bilirubin: 0.4 mg/dL (ref 0.3–0.9)
Total Bilirubin: 0.5 mg/dL (ref 0.3–1.2)
Total Protein: 7.4 g/dL (ref 6.5–8.1)

## 2015-07-01 NOTE — Patient Instructions (Signed)
Weight check and 6 weeks. Office visit in 3 months.

## 2015-07-01 NOTE — Progress Notes (Signed)
Presenting complaint;  Follow-up for CBD stricture and weight loss.  Database:  Patient is 75 year old Caucasian female who presented in February 2017 with abdominal pain nausea vomiting mildly elevated AST and alkaline phosphatase. Imaging studies revealed distended gallbladder with gallstones as well as dilated biliary system but no evidence of choledocholithiasis. 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 She had tiny Ave Filter orifice with dilated CBD and CHD with maximal diameter of 17 mm intrahepatic biliary radicles are also dilated. There were no stones. She was felt to have papillary stenosis in distal CBD stricture. Biliary stent was placed for decompression. Patient was readmitted about 10 days later with abdominal pain nausea and vomiting and symptoms are felt to be due to symptomatic cholelithiasis. She underwent laparoscopic cholecystectomy. Patient was scheduled for ERCP later this month. Patient was convinced the stent was making her sick and requested it to be removed no matter what. She underwent ERCP on 06/12/2015. Biliary stent was removed. Biliary dilation has decreased. Distal CBD stricture noted but was not high-grade. CA-19-9 and IgG4 were obtained and these were normal.  Subjective:   Patient feels much better. She has not experienced any more episodes of abdominal pain nausea or vomiting and she says her appetite is improved. However she has not gained any weight. She did vomit this morning knowing that she had to have blood drawn. She stopped vomiting as well as the blood was drawn and she went home and ate her breakfast. She denies melena or rectal bleeding or diarrhea. She says every time she gets upset or nervous she starts having nausea and vomiting. Back on 04/22/2015 she weighed 114 pounds. Now she weighs 103 pounds.      Current Medications: Outpatient Encounter Prescriptions as of 07/01/2015   Medication Sig  . acetaminophen (TYLENOL) 325 MG tablet Take 2 tablets (650 mg total) by mouth every 6 (six) hours as needed for mild pain (or Fever >/= 101).  . methimazole (TAPAZOLE) 5 MG tablet Take 1 tablet (5 mg total) by mouth daily.  . metoprolol tartrate (LOPRESSOR) 25 MG tablet Take 0.5 tablets (12.5 mg total) by mouth daily.  . pantoprazole (PROTONIX) 40 MG tablet Take 1 tablet (40 mg total) by mouth 2 (two) times daily before a meal.  . aspirin EC 81 MG tablet Take 81 mg by mouth daily. Reported on 07/01/2015  . [DISCONTINUED] ondansetron (ZOFRAN) 4 MG tablet Take 1 tablet (4 mg total) by mouth 2 (two) times daily as needed for nausea or vomiting. (Patient not taking: Reported on 07/01/2015)  . [DISCONTINUED] oxyCODONE (OXY IR/ROXICODONE) 5 MG immediate release tablet Take 1 tablet (5 mg total) by mouth every 6 (six) hours as needed for severe pain. (Patient not taking: Reported on 07/01/2015)   No facility-administered encounter medications on file as of 07/01/2015.     Objective: Blood pressure 108/68, pulse 74, temperature 97.8 F (36.6 C), temperature source Oral, resp. rate 18, height 5\' 2"  (1.575 m), weight 103 lb 11.2 oz (47.038 kg).  patient is alert and in no acute distress. She has bilateral bilateral exopthalmos. Conjunctiva is pink. Sclera is nonicteric Oropharyngeal mucosa is normal. No neck masses or thyromegaly noted. Cardiac exam with regular rhythm normal S1 and S2. No murmur or gallop noted. Lungs are clear to auscultation. Abdomen is symmetrical soft and nontender without organomegaly or masses. No LE edema or clubbing noted.  Labs/studies Results:   Recent Labs  07/01/15 0745  PROT 7.4  ALBUMIN 4.0  AST 16  ALT 12*  ALKPHOS 129*  BILITOT 0.5  BILIDIR 0.1  IBILI 0.4      Assessment:  #1. 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 There has been no bump in transaminases or alkaline phosphatase since stent was removed. Serum IgG 4 and CA-19-9 are  normal. #2. Weight loss. She has not gained any weight yet. Now that appetite has improved weight loss should reverse. some of her weight loss may be secondary to thyrotoxicosis. She is under care of endocrinologist. She is on methimazole.   Plan:  Patient encouraged to eat snacks between meals. Weight check in 6 weeks. Patient will call if she has abdominal pain or recurrent nausea and vomiting. Office visit in 3 months.

## 2015-07-02 ENCOUNTER — Telehealth: Payer: Self-pay | Admitting: Endocrinology

## 2015-07-02 ENCOUNTER — Ambulatory Visit (INDEPENDENT_AMBULATORY_CARE_PROVIDER_SITE_OTHER): Payer: Medicare Other | Admitting: Internal Medicine

## 2015-07-02 DIAGNOSIS — E0590 Thyrotoxicosis, unspecified without thyrotoxic crisis or storm: Secondary | ICD-10-CM

## 2015-07-02 NOTE — Telephone Encounter (Signed)
Pt daughter called and said they did want to go ahead with the Iodine test and that she was wondering if she could just have it done at [facility] so she would not have to go all the way to [place].

## 2015-07-02 NOTE — Telephone Encounter (Signed)
See note below and please advise, Thanks! 

## 2015-07-02 NOTE — Telephone Encounter (Signed)
Ok D/c methimazole Please continue the same metoprolol let's check a thyroid "scan" (a special, but easy and painless type of thyroid x ray).  It works like this: you go to the x-ray department of the hospital to swallow a pill, which contains a miniscule amount of radiation.  You will not notice any symptoms from this.  You will go back to the x-ray department the next day, to lie down in front of a camera.  The results of this will be sent to me.   Based on the results, i hope to order for you a treatment pill of radioactive iodine.  Although it is a larger amount of radiation, you will again notice no symptoms from this.  The pill is gone from your body in a few days (during which you should stay away from other people), but takes several months to work.  Therefore, please return here approximately 6-8 weeks after the treatment.  This treatment has been available for many years, and the only known side-effect is an underactive thyroid.  It is possible that i would eventually prescribe for you a thyroid hormone pill, which is very inexpensive.  You don't have to worry about side-effects of this thyroid hormone pill, because it is the same molecule your thyroid makes.

## 2015-07-03 NOTE — Telephone Encounter (Signed)
Requested a call back from the pt or pt's daughter to discuss.

## 2015-07-03 NOTE — Telephone Encounter (Signed)
Attempted to reach the pt. Pt's number was busy will try again at later time.

## 2015-07-03 NOTE — Telephone Encounter (Signed)
I contacted the pt's daughter and advised of MD's note and instructions below. She voiced understanding.

## 2015-07-08 ENCOUNTER — Encounter (HOSPITAL_COMMUNITY)
Admission: RE | Admit: 2015-07-08 | Discharge: 2015-07-08 | Disposition: A | Discharge status: Home / Self Care | Payer: Medicare Other | Source: Ambulatory Visit | Attending: Endocrinology | Admitting: Endocrinology

## 2015-07-08 ENCOUNTER — Encounter (HOSPITAL_COMMUNITY): Payer: Self-pay

## 2015-07-08 DIAGNOSIS — E0590 Thyrotoxicosis, unspecified without thyrotoxic crisis or storm: Secondary | ICD-10-CM

## 2015-07-08 MED ORDER — SODIUM IODIDE I 131 CAPSULE
11.0000 | Freq: Once | INTRAVENOUS | Status: AC | PRN
  Administered 2015-07-08: 11 via ORAL

## 2015-07-09 ENCOUNTER — Encounter (HOSPITAL_COMMUNITY)
Admission: RE | Admit: 2015-07-09 | Discharge: 2015-07-09 | Disposition: A | Discharge status: Home / Self Care | Payer: Medicare Other | Source: Ambulatory Visit | Attending: Endocrinology | Admitting: Endocrinology

## 2015-07-09 DIAGNOSIS — E0590 Thyrotoxicosis, unspecified without thyrotoxic crisis or storm: Secondary | ICD-10-CM | POA: Diagnosis not present

## 2015-07-09 MED ORDER — SODIUM PERTECHNETATE TC 99M INJECTION
10.0000 | Freq: Once | INTRAVENOUS | Status: AC | PRN
  Administered 2015-07-09: 10.6 via INTRAVENOUS

## 2015-07-11 ENCOUNTER — Ambulatory Visit: Payer: Medicare Other | Admitting: Endocrinology

## 2015-07-17 ENCOUNTER — Other Ambulatory Visit: Payer: Self-pay | Admitting: Endocrinology

## 2015-07-17 ENCOUNTER — Encounter: Payer: Self-pay | Admitting: Endocrinology

## 2015-07-17 DIAGNOSIS — E0590 Thyrotoxicosis, unspecified without thyrotoxic crisis or storm: Secondary | ICD-10-CM

## 2015-07-21 ENCOUNTER — Other Ambulatory Visit: Payer: Self-pay | Admitting: Ophthalmology

## 2015-07-21 DIAGNOSIS — E0500 Thyrotoxicosis with diffuse goiter without thyrotoxic crisis or storm: Secondary | ICD-10-CM

## 2015-07-22 ENCOUNTER — Telehealth: Payer: Self-pay | Admitting: Endocrinology

## 2015-07-22 NOTE — Telephone Encounter (Signed)
I contacted the pt's daughter and advised we have notified our [facility] about the change of location. She voiced understanding. Pt's daughter also stated the pt is having a study done on the orbits of her eyes on Friday. She wanted to verify if the pt needed to wait a certain length of time before having the RAI therapy if she has this test of Friday. Please advise, Thanks!

## 2015-07-22 NOTE — Telephone Encounter (Signed)
Patient daughter stated that she she wanted her mother to go to [facility] instead of [place] could Dr Loanne Drilling change referral to [employer]

## 2015-07-22 NOTE — Telephone Encounter (Signed)
We have asked radiol scheduling to see if the CT could be done after the RAI.  If the CT is done before, you have to wait several months to get the RAI.  If on the other hand, you do the RAI first, you can get the CT done just a few days later.

## 2015-07-22 NOTE — Telephone Encounter (Signed)
Pt's daughter advised of note below and voiced understanding.  

## 2015-07-24 DIAGNOSIS — R030 Elevated blood-pressure reading, without diagnosis of hypertension: Secondary | ICD-10-CM | POA: Diagnosis not present

## 2015-07-24 DIAGNOSIS — S22000A Wedge compression fracture of unspecified thoracic vertebra, initial encounter for closed fracture: Secondary | ICD-10-CM | POA: Diagnosis not present

## 2015-07-25 ENCOUNTER — Other Ambulatory Visit: Payer: Medicare Other

## 2015-07-28 ENCOUNTER — Other Ambulatory Visit: Payer: Self-pay | Admitting: Endocrinology

## 2015-07-28 ENCOUNTER — Telehealth: Payer: Self-pay | Admitting: Endocrinology

## 2015-07-28 ENCOUNTER — Telehealth: Payer: Self-pay

## 2015-07-28 DIAGNOSIS — E0590 Thyrotoxicosis, unspecified without thyrotoxic crisis or storm: Secondary | ICD-10-CM

## 2015-07-28 NOTE — Telephone Encounter (Signed)
I contacted the pt's daughter. She stated she has spoke with the [place] and [place] and voiced understanding on having to move the pt's RAI and having to repeat the Nuc Med scan due to the pt not coming off her methimazole. Pt and pt's daughter advised of new dates and voiced understanding.

## 2015-07-28 NOTE — Telephone Encounter (Signed)
Wanted to verify how long should the pt wait before having the nuclear medicine scan and CT Done?

## 2015-07-28 NOTE — Telephone Encounter (Signed)
D/c methimazole Please reschedule nuc med scan Please also delay the CT if possible

## 2015-07-28 NOTE — Telephone Encounter (Signed)
Requested a call back from the pt's daughter and Thurmond Butts with nuclear medicine to discuss.

## 2015-07-28 NOTE — Telephone Encounter (Signed)
[place] [facility] called to advise [place] the pt never went off her thyroid med with the 1st part of the testing ordered please advise on what to do from here [place] # [phone]

## 2015-07-28 NOTE — Telephone Encounter (Signed)
PT daughter said she was calling to speak to you, can call back on [phone]

## 2015-07-28 NOTE — Telephone Encounter (Signed)
[facility] calling to let us know the pt never went off her thyroid med with the 1st part of the testing ordered please advise on what to do from here [place] # [phone]

## 2015-07-28 NOTE — Telephone Encounter (Signed)
Due to your thyroid blood tests being significantly abnormal, you can do it just 1 week later

## 2015-07-28 NOTE — Telephone Encounter (Signed)
Nuc med called back and was advised of MD's instructions. Nuc med has contacted the pt's daughter and rescheduled the pt's nuc med scan and RAI. I contacted the pt's daughter and she stated she had no further questions at this time.

## 2015-07-29 ENCOUNTER — Encounter: Payer: Self-pay | Admitting: Endocrinology

## 2015-07-29 ENCOUNTER — Encounter (HOSPITAL_COMMUNITY): Payer: Medicare Other

## 2015-07-31 ENCOUNTER — Encounter (HOSPITAL_COMMUNITY): Payer: Medicare Other

## 2015-08-05 ENCOUNTER — Encounter (HOSPITAL_COMMUNITY): Payer: Self-pay

## 2015-08-05 ENCOUNTER — Encounter (HOSPITAL_COMMUNITY)
Admission: RE | Admit: 2015-08-05 | Discharge: 2015-08-05 | Disposition: A | Discharge status: Home / Self Care | Payer: Medicare Other | Source: Ambulatory Visit | Attending: Endocrinology | Admitting: Endocrinology

## 2015-08-05 DIAGNOSIS — E0590 Thyrotoxicosis, unspecified without thyrotoxic crisis or storm: Secondary | ICD-10-CM

## 2015-08-05 MED ORDER — SODIUM IODIDE I 131 CAPSULE
11.0000 | Freq: Once | INTRAVENOUS | Status: AC | PRN
  Administered 2015-08-05: 11 via ORAL

## 2015-08-06 ENCOUNTER — Encounter (HOSPITAL_COMMUNITY): Payer: Self-pay

## 2015-08-06 ENCOUNTER — Encounter (HOSPITAL_COMMUNITY)
Admission: RE | Admit: 2015-08-06 | Discharge: 2015-08-06 | Disposition: A | Discharge status: Home / Self Care | Payer: Medicare Other | Source: Ambulatory Visit | Attending: Endocrinology | Admitting: Endocrinology

## 2015-08-06 ENCOUNTER — Other Ambulatory Visit: Payer: Self-pay | Admitting: Endocrinology

## 2015-08-06 DIAGNOSIS — E0590 Thyrotoxicosis, unspecified without thyrotoxic crisis or storm: Secondary | ICD-10-CM

## 2015-08-06 MED ORDER — SODIUM PERTECHNETATE TC 99M INJECTION
10.0000 | Freq: Once | INTRAVENOUS | Status: AC | PRN
  Administered 2015-08-06: 10.5 via INTRAVENOUS

## 2015-08-08 ENCOUNTER — Encounter (HOSPITAL_COMMUNITY): Payer: Self-pay

## 2015-08-08 ENCOUNTER — Encounter (HOSPITAL_COMMUNITY)
Admission: RE | Admit: 2015-08-08 | Discharge: 2015-08-08 | Disposition: A | Discharge status: Home / Self Care | Payer: Medicare Other | Source: Ambulatory Visit | Attending: Endocrinology | Admitting: Endocrinology

## 2015-08-08 DIAGNOSIS — E0590 Thyrotoxicosis, unspecified without thyrotoxic crisis or storm: Secondary | ICD-10-CM | POA: Diagnosis not present

## 2015-08-08 MED ORDER — SODIUM IODIDE I 131 CAPSULE: 18.0000 | Freq: Once | INTRAVENOUS | Status: DC | PRN

## 2015-08-13 ENCOUNTER — Ambulatory Visit
Admission: RE | Admit: 2015-08-13 | Discharge: 2015-08-13 | Disposition: A | Discharge status: Home / Self Care | Payer: Medicare Other | Source: Ambulatory Visit | Attending: Ophthalmology | Admitting: Ophthalmology

## 2015-08-13 DIAGNOSIS — E0500 Thyrotoxicosis with diffuse goiter without thyrotoxic crisis or storm: Secondary | ICD-10-CM

## 2015-08-13 MED ORDER — IOPAMIDOL (ISOVUE-300) INJECTION 61%: 75.0000 mL | Freq: Once | INTRAVENOUS | Status: DC | PRN

## 2015-08-14 ENCOUNTER — Telehealth (INDEPENDENT_AMBULATORY_CARE_PROVIDER_SITE_OTHER): Payer: Self-pay | Admitting: *Deleted

## 2015-08-14 NOTE — Telephone Encounter (Signed)
Patient presented to the office to weigh in. At her OV 07/01/2015 she weighed 103 lbs 11.2 oz. Today she weighed 114 lbs.5 oz.

## 2015-08-18 DIAGNOSIS — E0500 Thyrotoxicosis with diffuse goiter without thyrotoxic crisis or storm: Secondary | ICD-10-CM | POA: Diagnosis not present

## 2015-08-25 NOTE — Telephone Encounter (Signed)
Talked with patient. She has OV next month.

## 2015-08-28 DIAGNOSIS — Z23 Encounter for immunization: Secondary | ICD-10-CM | POA: Diagnosis not present

## 2015-08-28 DIAGNOSIS — Z6821 Body mass index (BMI) 21.0-21.9, adult: Secondary | ICD-10-CM | POA: Diagnosis not present

## 2015-08-28 DIAGNOSIS — R7309 Other abnormal glucose: Secondary | ICD-10-CM | POA: Diagnosis not present

## 2015-09-24 ENCOUNTER — Encounter (INDEPENDENT_AMBULATORY_CARE_PROVIDER_SITE_OTHER): Payer: Self-pay | Admitting: *Deleted

## 2015-09-30 ENCOUNTER — Ambulatory Visit (INDEPENDENT_AMBULATORY_CARE_PROVIDER_SITE_OTHER): Payer: Medicare Other | Admitting: Internal Medicine

## 2015-10-01 ENCOUNTER — Telehealth: Payer: Self-pay

## 2015-10-01 NOTE — Telephone Encounter (Signed)
Routing to [facility]MR for his approval 

## 2015-10-01 NOTE — Telephone Encounter (Signed)
Rosendo Gros, please advise!

## 2015-10-01 NOTE — Telephone Encounter (Signed)
Pt's daughter, Vivien Rota, called to see if her mother could set up a colonoscopy with RMR. She said that NUR had seen patient while in hospital because he was the one on call, but patient didn't want to see NUR and wanted RMR to do her colonoscopy. Please advise, but it looks like she has had OV with NUR office in the past. Daughter said that her mother wanted to be a patient of RMR and was due for a colonoscopy. Please call Vivien Rota at [phone]

## 2015-10-02 NOTE — Telephone Encounter (Signed)
I be happy to assume GI care if ok with all parties. Will need an ov to see where we stand

## 2015-10-02 NOTE — Telephone Encounter (Signed)
Please schedule the patient to see an extender

## 2015-10-07 ENCOUNTER — Encounter: Payer: Self-pay | Admitting: Internal Medicine

## 2015-10-07 NOTE — Telephone Encounter (Signed)
APPOINTMENT MADE AND LETTER SENT °

## 2015-11-06 ENCOUNTER — Ambulatory Visit (INDEPENDENT_AMBULATORY_CARE_PROVIDER_SITE_OTHER): Payer: Medicare Other | Admitting: Nurse Practitioner

## 2015-11-06 ENCOUNTER — Encounter: Payer: Self-pay | Admitting: Nurse Practitioner

## 2015-11-06 ENCOUNTER — Other Ambulatory Visit: Payer: Self-pay

## 2015-11-06 DIAGNOSIS — Z86010 Personal history of colonic polyps: Secondary | ICD-10-CM

## 2015-11-06 MED ORDER — SOD PICOSULFATE-MAG OX-CIT ACD 10-3.5-12 MG-GM-GM PO PACK: 1.0000 | PACK | ORAL | 0 refills | Status: DC

## 2015-11-06 MED ORDER — PEG 3350-KCL-NA BICARB-NACL 420 G PO SOLR: 4000.0000 mL | ORAL | 0 refills | Status: DC

## 2015-11-06 NOTE — Patient Instructions (Signed)
1. We will schedule your procedure for you. 2. Further recommendations to be based on the results of your procedure. 3. Return for follow-up based on those recommendations made after your procedure.

## 2015-11-06 NOTE — Progress Notes (Signed)
Primary Care Physician:  Purvis Kilts, MD Primary Gastroenterologist:  Dr. Gala Romney  Chief Complaint  Patient presents with  . Colonoscopy    HPI:   Courtney Arnold is a 75 y.o. female who presents to establish care and set up for a colonoscopy. Last colonoscopy of record performed 09/17/2005 for intermittent hematochezia in the setting of occasional constipation which found external hemorrhoid tags, adequate prep, anal papilla and internal hemorrhoids, two 3 mm polyps in the rectum, left-sided diverticula. Surgical pathology Shows the polyp to be hyperplastic.  Today she states she is currently due for a repeat colonoscopy. She denies abdominal pain, N/V, hematochezia, melena, changes in bowel habits, unintentional weight loss. She just had her gallbladder out about 6 months ago. Denies chest pain, dyspnea, dizziness, lightheadedness, syncope, near syncope. Denies any other upper or lower GI symptoms.  Past Medical History:  Diagnosis Date  . Atrial fibrillation ([place]) 2011   Postop, spontaneous conversion to normal sinus after one hour  . Compression fracture 07/24/09   T12; kyphoplasty  . History of echocardiogram 5/11   EF 65%  . Tobacco abuse     Past Surgical History:  Procedure Laterality Date  . BACK SURGERY    . BACK SURGERY  06/06/2015  . CHOLECYSTECTOMY N/A 04/25/2015   Procedure: LAPAROSCOPIC CHOLECYSTECTOMY WITH INTRAOPERATIVE CHOLANGIOGRAM;  Surgeon: Arta Bruce Kinsinger, MD;  Location: [facility];  Service: General;  Laterality: N/A;  . ERCP N/A 04/14/2015   Procedure: ENDOSCOPIC RETROGRADE CHOLANGIOPANCREATOGRAPHY (ERCP) Biliary Sphincterotomy, 10x7 stent placement Dilated bilary system just not well seen;  Surgeon: Rogene Houston, MD;  Location: [facility];  Service: Endoscopy;  Laterality: N/A;  . ERCP N/A 06/12/2015   Procedure: ENDOSCOPIC RETROGRADE CHOLANGIOPANCREATOGRAPHY (ERCP);  Surgeon: Rogene Houston, MD;  Location: [facility];  Service: Endoscopy;   Laterality: N/A;  . ESOPHAGOGASTRODUODENOSCOPY N/A 06/12/2015   Procedure: DIAGNOSTIC ESOPHAGOGASTRODUODENOSCOPY (EGD);  Surgeon: Rogene Houston, MD;  Location: [facility];  Service: Endoscopy;  Laterality: N/A;  . STENT REMOVAL  06/12/2015   Procedure: STENT REMOVAL ;  Surgeon: Rogene Houston, MD;  [facility]: [facility];  Service: Endoscopy;;    Current Outpatient Prescriptions  Medication Sig Dispense Refill  . metoprolol tartrate (LOPRESSOR) 25 MG tablet Take 0.5 tablets (12.5 mg total) by mouth daily. 30 tablet 11   No current facility-administered medications for this visit.     Allergies as of 11/06/2015  . (No Known Allergies)    Family History  Problem Relation Age of Onset  . Heart attack Father   . Stroke Father   . Breast cancer Sister   . Thyroid disease Neg Hx   . Colon cancer Neg Hx     Social History   Social History  . Marital status: Married    Spouse name: N/A  . Number of children: 2  . Years of education: N/A   Occupational History  . Not on file.   Social History Main Topics  . Smoking status: Former Smoker    Packs/day: 0.15    Years: 20.00    Quit date: 03/08/2013  . Smokeless tobacco: Never Used  . Alcohol use No  . Drug use: No  . Sexual activity: Not Currently   Other Topics Concern  . Not on file   Social History Narrative   Active in gardens and does yard work.    Review of Systems: General: Negative for anorexia, weight loss, fever, chills, fatigue, weakness. ENT: Negative for hoarseness,  difficulty swallowing. CV: Negative for chest pain, angina, palpitations, peripheral edema.  Respiratory: Negative for dyspnea at rest,cough, sputum, wheezing.  GI: See history of present illness. Endo: Negative for unusual weight change.  Heme: Negative for bruising or bleeding.    Physical Exam: BP 140/70   Pulse 65   Temp 97.5 F (36.4 C) (Oral)   Ht 5\' 2"  (1.575 m)   Wt 131 lb (59.4 kg)   BMI 23.96 kg/m  General:   Alert and  oriented. Pleasant and cooperative. Well-nourished and well-developed.  Head:  Normocephalic and atraumatic. Eyes:  Without icterus, sclera clear and conjunctiva pink.  Ears:  Normal auditory acuity. Cardiovascular:  S1, S2 present without murmurs appreciated. Extremities without clubbing or edema. Respiratory:  Clear to auscultation bilaterally. No wheezes, rales, or rhonchi. No distress.  Gastrointestinal:  +BS, soft, non-tender and non-distended. No HSM noted. No guarding or rebound. No masses appreciated.  Rectal:  Deferred  Musculoskalatal:  Symmetrical without gross deformities. Neurologic:  Alert and oriented x4;  grossly normal neurologically. Psych:  Alert and cooperative. Normal mood and affect. Heme/Lymph/Immune: No excessive bruising noted.    11/06/2015 8:31 AM   Disclaimer: This note was dictated with voice recognition software. Similar sounding words can inadvertently be transcribed and may not be corrected upon review.

## 2015-11-06 NOTE — Progress Notes (Signed)
cc'ed to pcp °

## 2015-11-06 NOTE — Assessment & Plan Note (Signed)
Patient with a history of colon polyps on previous colonoscopy 2007, surgical pathology report shows the polyp to be hyperplastic and while there were no mailed to the patient generally would have been a 10 year repeat colonoscopy for which she is currently due. She is generally asymptomatic from a GI standpoint. We will proceed with screening colonoscopy.  Proceed with TCS with Dr. Gala Romney in near future: the risks, benefits, and alternatives have been discussed with the patient in detail. The patient states understanding and desires to proceed.  The patient is not on any anticoagulants, anxiolytics, chronic pain medications, or antidepressants. Conscious sedation should be adequate for her procedure.

## 2015-11-26 ENCOUNTER — Ambulatory Visit (HOSPITAL_COMMUNITY)
Admission: RE | Admit: 2015-11-26 | Discharge: 2015-11-26 | Disposition: A | Discharge status: Home / Self Care | Payer: Medicare Other | Source: Ambulatory Visit | Attending: Internal Medicine | Admitting: Internal Medicine

## 2015-11-26 ENCOUNTER — Encounter (HOSPITAL_COMMUNITY): Payer: Self-pay

## 2015-11-26 ENCOUNTER — Encounter (HOSPITAL_COMMUNITY)
Admission: RE | Disposition: A | Discharge status: Home / Self Care | Payer: Self-pay | Source: Ambulatory Visit | Attending: Internal Medicine

## 2015-11-26 DIAGNOSIS — I4891 Unspecified atrial fibrillation: Secondary | ICD-10-CM | POA: Diagnosis not present

## 2015-11-26 DIAGNOSIS — Z1211 Encounter for screening for malignant neoplasm of colon: Secondary | ICD-10-CM | POA: Insufficient documentation

## 2015-11-26 DIAGNOSIS — Z9049 Acquired absence of other specified parts of digestive tract: Secondary | ICD-10-CM | POA: Diagnosis not present

## 2015-11-26 DIAGNOSIS — Z79899 Other long term (current) drug therapy: Secondary | ICD-10-CM | POA: Insufficient documentation

## 2015-11-26 DIAGNOSIS — Z86010 Personal history of colonic polyps: Secondary | ICD-10-CM | POA: Insufficient documentation

## 2015-11-26 DIAGNOSIS — K6389 Other specified diseases of intestine: Secondary | ICD-10-CM | POA: Insufficient documentation

## 2015-11-26 DIAGNOSIS — Z87891 Personal history of nicotine dependence: Secondary | ICD-10-CM | POA: Insufficient documentation

## 2015-11-26 DIAGNOSIS — K5790 Diverticulosis of intestine, part unspecified, without perforation or abscess without bleeding: Secondary | ICD-10-CM | POA: Insufficient documentation

## 2015-11-26 HISTORY — PX: COLONOSCOPY: SHX5424

## 2015-11-26 HISTORY — DX: Essential (primary) hypertension: I10

## 2015-11-26 SURGERY — COLONOSCOPY
Anesthesia: Moderate Sedation

## 2015-11-26 MED ORDER — MIDAZOLAM HCL 5 MG/5ML IJ SOLN
INTRAMUSCULAR | Status: DC | PRN
  Administered 2015-11-26: 2 mg via INTRAVENOUS
  Administered 2015-11-26: 1 mg via INTRAVENOUS

## 2015-11-26 MED ORDER — ONDANSETRON HCL 4 MG/2ML IJ SOLN
INTRAMUSCULAR | Status: DC | PRN
  Administered 2015-11-26: 4 mg via INTRAVENOUS

## 2015-11-26 MED ORDER — MEPERIDINE HCL 100 MG/ML IJ SOLN
INTRAMUSCULAR | Status: DC | PRN
  Administered 2015-11-26: 25 mg via INTRAVENOUS

## 2015-11-26 MED ORDER — STERILE WATER FOR IRRIGATION IR SOLN
Status: DC | PRN
  Administered 2015-11-26: 2.5 mL

## 2015-11-26 MED ORDER — MIDAZOLAM HCL 5 MG/5ML IJ SOLN
INTRAMUSCULAR | Status: AC
  Filled 2015-11-26: qty 10

## 2015-11-26 MED ORDER — MEPERIDINE HCL 100 MG/ML IJ SOLN
INTRAMUSCULAR | Status: AC
  Filled 2015-11-26: qty 2

## 2015-11-26 MED ORDER — ONDANSETRON HCL 4 MG/2ML IJ SOLN
INTRAMUSCULAR | Status: AC
  Filled 2015-11-26: qty 2

## 2015-11-26 MED ORDER — SODIUM CHLORIDE 0.9 % IV SOLN
INTRAVENOUS | Status: DC
  Administered 2015-11-26: 07:00:00 via INTRAVENOUS

## 2015-11-26 NOTE — Op Note (Signed)
[facility] Patient Name: Courtney Arnold Procedure Date: 11/26/2015 7:28 AM MRN: [id number] Date of Birth: 31-Oct-1940 Attending MD: Norvel Richards , MD CSN: [id number] Age: 75 Admit Type: Outpatient Procedure:                Colonoscopy - Surveillance Indications:              High risk colon cancer surveillance: Personal                            history of colonic polyps Providers:                Norvel Richards, MD, Lurline Del, RN, Purcell Nails.                            Proctor, Merchant [employer] officer Referring MD:              Medicines:                Midazolam 3 mg IV, Meperidine 25 mg IV, Ondansetron                            4 mg IV Complications:            No immediate complications. Estimated Blood Loss:     Estimated blood loss: none. Procedure:                Pre-Anesthesia Assessment:                           - Prior to the procedure, a History and Physical                            was performed, and patient medications and                            allergies were reviewed. The patient's tolerance of                            previous anesthesia was also reviewed. The risks                            and benefits of the procedure and the sedation                            options and risks were discussed with the patient.                            All questions were answered, and informed consent                            was obtained. Prior Anticoagulants: The patient has                            taken no previous anticoagulant or antiplatelet  agents. ASA Grade Assessment: III - A patient with                            severe systemic disease. After reviewing the risks                            and benefits, the patient was deemed in                            satisfactory condition to undergo the procedure.                           After obtaining informed consent, the colonoscope                            was passed under  direct vision. Throughout the                            procedure, the patient's blood pressure, pulse, and                            oxygen saturations were monitored continuously. The                            [id number]-[id number]) scope was introduced through                            the anus and advanced to the the cecum, identified                            by appendiceal orifice and ileocecal valve. The                            colonoscopy was performed without difficulty. The                            patient tolerated the procedure well. The entire                            colon was well visualized. The quality of the bowel                            preparation was adequate. The ileocecal valve,                            appendiceal orifice, and rectum were photographed. Scope In: 7:37:49 AM Scope Out: 7:58:52 AM Scope Withdrawal Time: 0 hours 6 minutes 42 seconds  Total Procedure Duration: 0 hours 21 minutes 3 seconds  Findings:      The perianal and digital rectal examinations were normal.      Diverticula in the sigmoid colon and descending colon. The colonic       mucosa otherwise appeared normal. Colon was tortuous requiring changing       of the patient's position and external  abdominal pressure to reach the       cecum.Rectal vault small; unable to retroflal - mucosa seen well on-face Impression:               - clonic diverticulosis. Tortuous colon.                           - No specimens collected. Moderate Sedation:      Moderate (conscious) sedation was administered by the endoscopy nurse       and supervised by the endoscopist. The following parameters were       monitored: oxygen saturation, heart rate, blood pressure, respiratory       rate, EKG, adequacy of pulmonary ventilation, and response to care.       Total physician intraservice time was 26 minutes. Recommendation:           - Patient has a contact number available for                             emergencies. The signs and symptoms of potential                            delayed complications were discussed with the                            patient. Return to normal activities tomorrow.                            Written discharge instructions were provided to the                            patient.                           - Advance diet as tolerated.                           - Patient has a contact number available for                            emergencies. The signs and symptoms of potential                            delayed complications were discussed with the                            patient. Return to normal activities tomorrow.                            Written discharge instructions were provided to the                            patient.                           - Advance diet as tolerated.                           -  Continue present medications. No future                            colonoscopy unless new symptoms develop.                           - No repeat colonoscopy due to age. Procedure Code(s):        --- Professional ---                           [phone], Colonoscopy, flexible; diagnostic, including                            collection of specimen(s) by brushing or washing,                            when performed (separate procedure)                           99152, Moderate sedation services provided by the                            same physician or other qualified health care                            professional performing the diagnostic or                            therapeutic service that the sedation supports,                            requiring the presence of an independent trained                            observer to assist in the monitoring of the                            patient's level of consciousness and physiological                            status; initial 15 minutes of intraservice time,                            patient  age 18 years or older                           [phone], Moderate sedation services; each additional                            15 minutes intraservice time Diagnosis Code(s):        --- Professional ---                           Z86.010, Personal history of colonic polyps  K57.30, Diverticulosis of large intestine without                            perforation or abscess without bleeding CPT copyright 2016 American Medical Association. All rights reserved. The codes documented in this report are preliminary and upon coder review may  be revised to meet current compliance requirements. Cristopher Estimable. Elisheva Fallas, MD Norvel Richards, MD 11/26/2015 8:19:32 AM This report has been signed electronically. Number of Addenda: 0

## 2015-11-26 NOTE — H&P (View-Only) (Signed)
Primary Care Physician:  Purvis Kilts, MD Primary Gastroenterologist:  Dr. Gala Romney  Chief Complaint  Patient presents with  . Colonoscopy    HPI:   Courtney Arnold is a 75 y.o. female who presents to establish care and set up for a colonoscopy. Last colonoscopy of record performed 09/17/2005 for intermittent hematochezia in the setting of occasional constipation which found external hemorrhoid tags, adequate prep, anal papilla and internal hemorrhoids, two 3 mm polyps in the rectum, left-sided diverticula. Surgical pathology Shows the polyp to be hyperplastic.  Today she states she is currently due for a repeat colonoscopy. She denies abdominal pain, N/V, hematochezia, melena, changes in bowel habits, unintentional weight loss. She just had her gallbladder out about 6 months ago. Denies chest pain, dyspnea, dizziness, lightheadedness, syncope, near syncope. Denies any other upper or lower GI symptoms.  Past Medical History:  Diagnosis Date  . Atrial fibrillation ([place]) 2011   Postop, spontaneous conversion to normal sinus after one hour  . Compression fracture 07/24/09   T12; kyphoplasty  . History of echocardiogram 5/11   EF 65%  . Tobacco abuse     Past Surgical History:  Procedure Laterality Date  . BACK SURGERY    . BACK SURGERY  06/06/2015  . CHOLECYSTECTOMY N/A 04/25/2015   Procedure: LAPAROSCOPIC CHOLECYSTECTOMY WITH INTRAOPERATIVE CHOLANGIOGRAM;  Surgeon: Arta Bruce Kinsinger, MD;  Location: [facility];  Service: General;  Laterality: N/A;  . ERCP N/A 04/14/2015   Procedure: ENDOSCOPIC RETROGRADE CHOLANGIOPANCREATOGRAPHY (ERCP) Biliary Sphincterotomy, 10x7 stent placement Dilated bilary system just not well seen;  Surgeon: Rogene Houston, MD;  Location: [facility];  Service: Endoscopy;  Laterality: N/A;  . ERCP N/A 06/12/2015   Procedure: ENDOSCOPIC RETROGRADE CHOLANGIOPANCREATOGRAPHY (ERCP);  Surgeon: Rogene Houston, MD;  [facility];  Service: Endoscopy;   Laterality: N/A;  . ESOPHAGOGASTRODUODENOSCOPY N/A 06/12/2015   Procedure: DIAGNOSTIC ESOPHAGOGASTRODUODENOSCOPY (EGD);  Surgeon: Rogene Houston, MD;  [facility];  Service: Endoscopy;  Laterality: N/A;  . STENT REMOVAL  06/12/2015   Procedure: STENT REMOVAL ;  Surgeon: Rogene Houston, MD;  [facility]: [facility];  Service: Endoscopy;;    Current Outpatient Prescriptions  Medication Sig Dispense Refill  . metoprolol tartrate (LOPRESSOR) 25 MG tablet Take 0.5 tablets (12.5 mg total) by mouth daily. 30 tablet 11   No current facility-administered medications for this visit.     Allergies as of 11/06/2015  . (No Known Allergies)    Family History  Problem Relation Age of Onset  . Heart attack Father   . Stroke Father   . Breast cancer Sister   . Thyroid disease Neg Hx   . Colon cancer Neg Hx     Social History   Social History  . Marital status: Married    Spouse name: N/A  . Number of children: 2  . Years of education: N/A   Occupational History  . Not on file.   Social History Main Topics  . Smoking status: Former Smoker    Packs/day: 0.15    Years: 20.00    Quit date: 03/08/2013  . Smokeless tobacco: Never Used  . Alcohol use No  . Drug use: No  . Sexual activity: Not Currently   Other Topics Concern  . Not on file   Social History Narrative   Active in gardens and does yard work.    Review of Systems: General: Negative for anorexia, weight loss, fever, chills, fatigue, weakness. ENT: Negative for hoarseness,  difficulty swallowing. CV: Negative for chest pain, angina, palpitations, peripheral edema.  Respiratory: Negative for dyspnea at rest,cough, sputum, wheezing.  GI: See history of present illness. Endo: Negative for unusual weight change.  Heme: Negative for bruising or bleeding.    Physical Exam: BP 140/70   Pulse 65   Temp 97.5 F (36.4 C) (Oral)   Ht 5\' 2"  (1.575 m)   Wt 131 lb (59.4 kg)   BMI 23.96 kg/m  General:   Alert and  oriented. Pleasant and cooperative. Well-nourished and well-developed.  Head:  Normocephalic and atraumatic. Eyes:  Without icterus, sclera clear and conjunctiva pink.  Ears:  Normal auditory acuity. Cardiovascular:  S1, S2 present without murmurs appreciated. Extremities without clubbing or edema. Respiratory:  Clear to auscultation bilaterally. No wheezes, rales, or rhonchi. No distress.  Gastrointestinal:  +BS, soft, non-tender and non-distended. No HSM noted. No guarding or rebound. No masses appreciated.  Rectal:  Deferred  Musculoskalatal:  Symmetrical without gross deformities. Neurologic:  Alert and oriented x4;  grossly normal neurologically. Psych:  Alert and cooperative. Normal mood and affect. Heme/Lymph/Immune: No excessive bruising noted.    11/06/2015 8:31 AM   Disclaimer: This note was dictated with voice recognition software. Similar sounding words can inadvertently be transcribed and may not be corrected upon review.

## 2015-11-26 NOTE — Interval H&P Note (Signed)
History and Physical Interval Note:  11/26/2015 7:26 AM  Courtney Arnold  has presented today for surgery, with the diagnosis of hx colon polyps  The various methods of treatment have been discussed with the patient and family. After consideration of risks, benefits and other options for treatment, the patient has consented to  Procedure(s) with comments: COLONOSCOPY (N/A) - 7:30 am as a surgical intervention .  The patient's history has been reviewed, patient examined, no change in status, stable for surgery.  I have reviewed the patient's chart and labs.  Questions were answered to the patient's satisfaction.     Davell Beckstead  No change. History of colonic polyp. Surveillance colonoscopy per plan.  The risks, benefits, limitations, alternatives and imponderables have been reviewed with the patient. Questions have been answered. All parties are agreeable.

## 2015-11-26 NOTE — Discharge Instructions (Addendum)
Colonoscopy Discharge Instructions  Read the instructions outlined below and refer to this sheet in the next few weeks. These discharge instructions provide you with general information on caring for yourself after you leave the hospital. Your doctor may also give you specific instructions. While your treatment has been planned according to the most current medical practices available, unavoidable complications occasionally occur. If you have any problems or questions after discharge, call Dr. Gala Romney at [phone]. ACTIVITY  You may resume your regular activity, but move at a slower pace for the next 24 hours.   Take frequent rest periods for the next 24 hours.   Walking will help get rid of the air and reduce the bloated feeling in your belly (abdomen).   No driving for 24 hours (because of the medicine (anesthesia) used during the test).    Do not sign any important legal documents or operate any machinery for 24 hours (because of the anesthesia used during the test).  NUTRITION  Drink plenty of fluids.   You may resume your normal diet as instructed by your doctor.   Begin with a light meal and progress to your normal diet. Heavy or fried foods are harder to digest and may make you feel sick to your stomach (nauseated).   Avoid alcoholic beverages for 24 hours or as instructed.  MEDICATIONS  You may resume your normal medications unless your doctor tells you otherwise.  WHAT YOU CAN EXPECT TODAY  Some feelings of bloating in the abdomen.   Passage of more gas than usual.   Spotting of blood in your stool or on the toilet paper.  SEEK IMMEDIATE MEDICAL ATTENTION IF:  You have more than a spotting of blood in your stool.   Your belly is swollen (abdominal distention).   You are nauseated or vomiting.   You have a temperature over 101.   You have abdominal pain or discomfort that is severe or gets worse throughout the day.   Colon diverticulosis information provided  No  future colonoscopy unless new symptoms develop.   Diverticulosis Diverticulosis is the condition that develops when small pouches (diverticula) form in the wall of your colon. Your colon, or large intestine, is where water is absorbed and stool is formed. The pouches form when the inside layer of your colon pushes through weak spots in the outer layers of your colon. CAUSES  No one knows exactly what causes diverticulosis. RISK FACTORS  Being older than 41. Your risk for this condition increases with age. Diverticulosis is rare in people younger than 40 years. By age 31, almost everyone has it.  Eating a low-fiber diet.  Being frequently constipated.  Being overweight.  Not getting enough exercise.  Smoking.  Taking over-the-counter pain medicines, like aspirin and ibuprofen. SYMPTOMS  Most people with diverticulosis do not have symptoms. DIAGNOSIS  Because diverticulosis often has no symptoms, health care providers often discover the condition during an exam for other colon problems. In many cases, a health care provider will diagnose diverticulosis while using a flexible scope to examine the colon (colonoscopy). TREATMENT  If you have never developed an infection related to diverticulosis, you may not need treatment. If you have had an infection before, treatment may include:  Eating more fruits, vegetables, and grains.  Taking a fiber supplement.  Taking a live bacteria supplement (probiotic).  Taking medicine to relax your colon. HOME CARE INSTRUCTIONS   Drink at least 6-8 glasses of water each day to prevent constipation.  Try not to  strain when you have a bowel movement.  Keep all follow-up appointments. If you have had an infection before:  Increase the fiber in your diet as directed by your health care provider or dietitian.  Take a dietary fiber supplement if your health care provider approves.  Only take medicines as directed by your health care  provider. SEEK MEDICAL CARE IF:   You have abdominal pain.  You have bloating.  You have cramps.  You have not gone to the bathroom in 3 days. SEEK IMMEDIATE MEDICAL CARE IF:   Your pain gets worse.  Yourbloating becomes very bad.  You have a fever or chills, and your symptoms suddenly get worse.  You begin vomiting.  You have bowel movements that are bloody or black. MAKE SURE YOU:  Understand these instructions.  Will watch your condition.  Will get help right away if you are not doing well or get worse.   This information is not intended to replace advice given to you by your health care provider. Make sure you discuss any questions you have with your health care provider.   Document Released: 11/20/2003 Document Revised: 02/27/2013 Document Reviewed: 01/17/2013 Elsevier Interactive Patient Education [employer].

## 2015-12-03 ENCOUNTER — Encounter (HOSPITAL_COMMUNITY): Payer: Self-pay | Admitting: Internal Medicine

## 2015-12-31 ENCOUNTER — Telehealth: Payer: Self-pay | Admitting: Endocrinology

## 2015-12-31 DIAGNOSIS — Z6825 Body mass index (BMI) 25.0-25.9, adult: Secondary | ICD-10-CM | POA: Diagnosis not present

## 2015-12-31 DIAGNOSIS — Z79899 Other long term (current) drug therapy: Secondary | ICD-10-CM | POA: Diagnosis not present

## 2015-12-31 DIAGNOSIS — J209 Acute bronchitis, unspecified: Secondary | ICD-10-CM | POA: Diagnosis not present

## 2015-12-31 DIAGNOSIS — J302 Other seasonal allergic rhinitis: Secondary | ICD-10-CM | POA: Diagnosis not present

## 2015-12-31 DIAGNOSIS — Z1389 Encounter for screening for other disorder: Secondary | ICD-10-CM | POA: Diagnosis not present

## 2015-12-31 DIAGNOSIS — J343 Hypertrophy of nasal turbinates: Secondary | ICD-10-CM | POA: Diagnosis not present

## 2015-12-31 DIAGNOSIS — E0590 Thyrotoxicosis, unspecified without thyrotoxic crisis or storm: Secondary | ICD-10-CM | POA: Diagnosis not present

## 2016-01-02 NOTE — Telephone Encounter (Signed)
Patient daughter ask if you received blood work from patients doctors office.  Please advise  [phone]

## 2016-01-02 NOTE — Telephone Encounter (Signed)
Ok, but if you want, I can prescribe for you a thyroid hormone pill, and you could come back here in approx 3 weeks.  Would you prefer?

## 2016-01-02 NOTE — Telephone Encounter (Signed)
Patient daughter is call for lab results.

## 2016-01-02 NOTE — Telephone Encounter (Signed)
Daughter is calling back regarding blood work from [facility]  Did we receive the paperwork and if so please have MD review this and let the pt know if any changes need to be made or an appt needs to be made  Please call [name] [phone]

## 2016-01-02 NOTE — Telephone Encounter (Signed)
Yes, we got it today. Your thyroid has gone low since the radioactive iodine. I thought you would come back here for f/u Do you want to f/u here, or at [facility]?

## 2016-01-02 NOTE — Telephone Encounter (Signed)
See message and please advise if these results have been reviewed.  Thanks!

## 2016-01-02 NOTE — Telephone Encounter (Signed)
I contacted the patient's daughter and advised of message. Patient will follow up with [place] on 01/05/2016.

## 2016-01-05 ENCOUNTER — Ambulatory Visit (INDEPENDENT_AMBULATORY_CARE_PROVIDER_SITE_OTHER): Payer: Medicare Other | Admitting: Endocrinology

## 2016-01-05 ENCOUNTER — Encounter: Payer: Self-pay | Admitting: Endocrinology

## 2016-01-05 DIAGNOSIS — E890 Postprocedural hypothyroidism: Secondary | ICD-10-CM | POA: Diagnosis not present

## 2016-01-05 MED ORDER — VITAMIN D (ERGOCALCIFEROL) 1.25 MG (50000 UNIT) PO CAPS: 50000.0000 [IU] | ORAL_CAPSULE | ORAL | 0 refills | Status: DC

## 2016-01-05 MED ORDER — TRIAMCINOLONE ACETONIDE 0.1 % EX CREA: 1.0000 "application " | TOPICAL_CREAM | Freq: Three times a day (TID) | CUTANEOUS | 2 refills | Status: DC

## 2016-01-05 MED ORDER — LEVOTHYROXINE SODIUM 100 MCG PO TABS: 100.0000 ug | ORAL_TABLET | Freq: Every day | ORAL | 3 refills | Status: DC

## 2016-01-05 NOTE — Progress Notes (Signed)
Subjective:    Patient ID: Courtney Arnold, female    DOB: 12/05/1940, 75 y.o.   MRN: [id number]  HPI Pt returns for f/u of hyperthyroidism (due to [place]; dx'ed 2017; after initial rx with tapazole, she was rx'ed with RAI in June of 2017).  She has leg cramps, insomnia, and fatigue.  She has moderate rash of the legs, and assoc itching. Past Medical History:  Diagnosis Date  . Atrial fibrillation ([place]) 2011   Postop, spontaneous conversion to normal sinus after one hour  . Compression fracture 07/24/09   T12; kyphoplasty  . History of echocardiogram 5/11   EF 65%  . Hypertension   . Tobacco abuse     Past Surgical History:  Procedure Laterality Date  . BACK SURGERY    . BACK SURGERY  06/06/2015  . CHOLECYSTECTOMY N/A 04/25/2015   Procedure: LAPAROSCOPIC CHOLECYSTECTOMY WITH INTRAOPERATIVE CHOLANGIOGRAM;  Surgeon: Mickeal Skinner, MD;  Location: [facility];  Service: General;  Laterality: N/A;  . COLONOSCOPY N/A 11/26/2015   Procedure: COLONOSCOPY;  Surgeon: Daneil Dolin, MD;  [facility];  Service: Endoscopy;  Laterality: N/A;  7:30 am  . ERCP N/A 04/14/2015   Procedure: ENDOSCOPIC RETROGRADE CHOLANGIOPANCREATOGRAPHY (ERCP) Biliary Sphincterotomy, 10x7 stent placement Dilated bilary system just not well seen;  Surgeon: Rogene Houston, MD;  Location: [facility];  Service: Endoscopy;  Laterality: N/A;  . ERCP N/A 06/12/2015   Procedure: ENDOSCOPIC RETROGRADE CHOLANGIOPANCREATOGRAPHY (ERCP);  Surgeon: Rogene Houston, MD;  [facility];  Service: Endoscopy;  Laterality: N/A;  . ESOPHAGOGASTRODUODENOSCOPY N/A 06/12/2015   Procedure: DIAGNOSTIC ESOPHAGOGASTRODUODENOSCOPY (EGD);  Surgeon: Rogene Houston, MD;  [facility];  Service: Endoscopy;  Laterality: N/A;  . STENT REMOVAL  06/12/2015   Procedure: STENT REMOVAL ;  Surgeon: Rogene Houston, MD;  [facility];  Service: Endoscopy;;    Social History   Social History  . Marital status:  Widowed    Spouse name: N/A  . Number of children: 2  . Years of education: N/A   Occupational History  . Not on file.   Social History Main Topics  . Smoking status: Former Smoker    Packs/day: 0.15    Years: 20.00    Quit date: 03/08/2013  . Smokeless tobacco: Never Used  . Alcohol use No  . Drug use: No  . Sexual activity: Not Currently   Other Topics Concern  . Not on file   Social History Narrative   Active in gardens and does yard work.    Current Outpatient Prescriptions on File Prior to Visit  Medication Sig Dispense Refill  . metoprolol tartrate (LOPRESSOR) 25 MG tablet Take 0.5 tablets (12.5 mg total) by mouth daily. 30 tablet 11  . polyethylene glycol-electrolytes (TRILYTE) 420 g solution Take 4,000 mLs by mouth as directed. 4000 mL 0   No current facility-administered medications on file prior to visit.     No Known Allergies  Family History  Problem Relation Age of Onset  . Heart attack Father   . Stroke Father   . Breast cancer Sister   . Thyroid disease Neg Hx   . Colon cancer Neg Hx     BP 134/80   Pulse 70   Ht 5\' 2"  (1.575 m)   Wt 140 lb (63.5 kg)   SpO2 94%   BMI 25.61 kg/m    Review of Systems She has doe and weight gain    Objective:  Physical Exam VITAL SIGNS:  See vs page GENERAL: no distress head: no deformity  eyes: no periorbital swelling; moderate bilat proptosis  NECK: There is no palpable thyroid enlargement.  No thyroid nodule is palpable.  No palpable lymphadenopathy at the anterior neck.  outside test results are reviewed: Vit-D is low and TSH is high.  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  Rash, new, possibly due to dermopathy.   Vit-D deficiency, new.   Patient is advised the following: Patient Instructions  I have sent 3 prescriptions to your pharmacy: for a thyroid  hormone pill, vitamin-D, and an anti-itch cream for you legs.   Please come back for a follow-up appointment in 1 month.

## 2016-01-05 NOTE — Telephone Encounter (Signed)
I contacted the daughter and advised of message. She stated she and the patient would still like to come in for the appointment scheduled for today and then go from there on what medication should be prescribed.

## 2016-01-05 NOTE — Telephone Encounter (Signed)
ok 

## 2016-01-05 NOTE — Patient Instructions (Addendum)
I have sent 3 prescriptions to your pharmacy: for a thyroid hormone pill, vitamin-D, and an anti-itch cream for you legs.   Please come back for a follow-up appointment in 1 month.

## 2016-01-06 DIAGNOSIS — E890 Postprocedural hypothyroidism: Secondary | ICD-10-CM | POA: Insufficient documentation

## 2016-01-09 ENCOUNTER — Other Ambulatory Visit (HOSPITAL_COMMUNITY): Payer: Self-pay | Admitting: Family Medicine

## 2016-01-09 DIAGNOSIS — Z1231 Encounter for screening mammogram for malignant neoplasm of breast: Secondary | ICD-10-CM

## 2016-01-27 DIAGNOSIS — E0500 Thyrotoxicosis with diffuse goiter without thyrotoxic crisis or storm: Secondary | ICD-10-CM | POA: Diagnosis not present

## 2016-02-02 ENCOUNTER — Telehealth: Payer: Self-pay | Admitting: Endocrinology

## 2016-02-02 DIAGNOSIS — E890 Postprocedural hypothyroidism: Secondary | ICD-10-CM

## 2016-02-02 DIAGNOSIS — E559 Vitamin D deficiency, unspecified: Secondary | ICD-10-CM

## 2016-02-02 NOTE — Telephone Encounter (Signed)
See message and please advise, Thanks!  

## 2016-02-02 NOTE — Telephone Encounter (Signed)
Ok, I have ordered 

## 2016-02-02 NOTE — Telephone Encounter (Signed)
Pt is asking to go to [facility] for labs, what is to to have done?  Please fax those orders to [facility] [phone]

## 2016-02-02 NOTE — Telephone Encounter (Signed)
when you come back in a few days, please remind me, and I'll request the blood that is drawn here be sent to [facility].  Also, you could have drawn there.  Please let me know.

## 2016-02-02 NOTE — Telephone Encounter (Signed)
Pt has an appt at [facility] for tomorrow she wants the orders to be sent today for her appt there please input labs

## 2016-02-02 NOTE — Telephone Encounter (Signed)
See message and please advise on how to proceed. Thanks!  

## 2016-02-02 NOTE — Telephone Encounter (Signed)
I contacted patient and advised we have faxed the lab results.

## 2016-02-03 ENCOUNTER — Other Ambulatory Visit: Payer: Self-pay | Admitting: Endocrinology

## 2016-02-03 DIAGNOSIS — E559 Vitamin D deficiency, unspecified: Secondary | ICD-10-CM | POA: Insufficient documentation

## 2016-02-03 DIAGNOSIS — E890 Postprocedural hypothyroidism: Secondary | ICD-10-CM | POA: Diagnosis not present

## 2016-02-03 DIAGNOSIS — Z23 Encounter for immunization: Secondary | ICD-10-CM | POA: Diagnosis not present

## 2016-02-03 NOTE — Telephone Encounter (Signed)
See message and please advise, Thanks!  

## 2016-02-03 NOTE — Telephone Encounter (Signed)
Daughter is asking why the TSH and the T 4 is all that is ordered?   Daughter is asking for a call back today asap, she is upset, she feels like the testing is not enough.

## 2016-02-03 NOTE — Telephone Encounter (Signed)
Courtney Arnold from [facility] asked you could fax over patient the lab order for the vitamin D  [phone]

## 2016-02-03 NOTE — Telephone Encounter (Addendum)
[facility] orders sent.

## 2016-02-03 NOTE — Telephone Encounter (Signed)
I have added vit-D What else do you think should be added.

## 2016-02-04 LAB — T4, FREE: Free T4: 1.63 ng/dL (ref 0.82–1.77)

## 2016-02-04 LAB — TSH: TSH: 14.07 u[IU]/mL — ABNORMAL HIGH (ref 0.450–4.500)

## 2016-02-05 ENCOUNTER — Ambulatory Visit (INDEPENDENT_AMBULATORY_CARE_PROVIDER_SITE_OTHER): Payer: Medicare Other | Admitting: Endocrinology

## 2016-02-05 ENCOUNTER — Encounter: Payer: Self-pay | Admitting: Endocrinology

## 2016-02-05 VITALS — BP 106/62 | HR 46 | Ht 62.0 in | Wt 137.0 lb

## 2016-02-05 DIAGNOSIS — E890 Postprocedural hypothyroidism: Secondary | ICD-10-CM | POA: Diagnosis not present

## 2016-02-05 LAB — VITAMIN D 25 HYDROXY (VIT D DEFICIENCY, FRACTURES)

## 2016-02-05 MED ORDER — LEVOTHYROXINE SODIUM 125 MCG PO TABS: 125.0000 ug | ORAL_TABLET | Freq: Every day | ORAL | 3 refills | Status: DC

## 2016-02-05 NOTE — Telephone Encounter (Signed)
Patient daughter want her mom to get labs around Feb 22, send labs to [facility]. and she also pushed her appt out to march.

## 2016-02-05 NOTE — Telephone Encounter (Signed)
See message to be advised.  

## 2016-02-05 NOTE — Telephone Encounter (Signed)
I put in the labs for "approx" date, so that would be fine.

## 2016-02-05 NOTE — Progress Notes (Signed)
Subjective:    Patient ID: Courtney Arnold, female    DOB: Jun 23, 1940, 75 y.o.   MRN: [id number]  HPI  The state of at least three ongoing medical problems is addressed today, with interval history of each noted here:  Pt returns for f/u of hyperthyroidism (CT showed Graves Ophthalmopathy ; dx'ed 2017; after initial rx with tapazole, in June of 2017, she was rx'ed with RAI; in Oct of 2017, she was started on Synthroid).  She has lost a few lbs   rash and itching of the legs are improved with TAC cream, but she continues to require it.  Vit-D deficiency: she has slight leg cramps. Past Medical History:  Diagnosis Date  . Atrial fibrillation ([place]) 2011   Postop, spontaneous conversion to normal sinus after one hour  . Compression fracture 07/24/09   T12; kyphoplasty  . History of echocardiogram 5/11   EF 65%  . Hypertension   . Tobacco abuse     Past Surgical History:  Procedure Laterality Date  . BACK SURGERY    . BACK SURGERY  06/06/2015  . CHOLECYSTECTOMY N/A 04/25/2015   Procedure: LAPAROSCOPIC CHOLECYSTECTOMY WITH INTRAOPERATIVE CHOLANGIOGRAM;  Surgeon: Mickeal Skinner, MD;  Location: [facility];  Service: General;  Laterality: N/A;  . COLONOSCOPY N/A 11/26/2015   Procedure: COLONOSCOPY;  Surgeon: Daneil Dolin, MD;  [facility];  Service: Endoscopy;  Laterality: N/A;  7:30 am  . ERCP N/A 04/14/2015   Procedure: ENDOSCOPIC RETROGRADE CHOLANGIOPANCREATOGRAPHY (ERCP) Biliary Sphincterotomy, 10x7 stent placement Dilated bilary system just not well seen;  Surgeon: Rogene Houston, MD;  Location: [facility];  Service: Endoscopy;  Laterality: N/A;  . ERCP N/A 06/12/2015   Procedure: ENDOSCOPIC RETROGRADE CHOLANGIOPANCREATOGRAPHY (ERCP);  Surgeon: Rogene Houston, MD;  [facility];  Service: Endoscopy;  Laterality: N/A;  . ESOPHAGOGASTRODUODENOSCOPY N/A 06/12/2015   Procedure: DIAGNOSTIC ESOPHAGOGASTRODUODENOSCOPY (EGD);  Surgeon: Rogene Houston, MD;  [facility];  Service: Endoscopy;  Laterality: N/A;  . STENT REMOVAL  06/12/2015   Procedure: STENT REMOVAL ;  Surgeon: Rogene Houston, MD;  [facility];  Service: Endoscopy;;    Social History   Social History  . Marital status: Widowed    Spouse name: N/A  . Number of children: 2  . Years of education: N/A   Occupational History  . Not on file.   Social History Main Topics  . Smoking status: Former Smoker    Packs/day: 0.15    Years: 20.00    Quit date: 03/08/2013  . Smokeless tobacco: Never Used  . Alcohol use No  . Drug use: No  . Sexual activity: Not Currently   Other Topics Concern  . Not on file   Social History Narrative   Active in gardens and does yard work.    Current Outpatient Prescriptions on File Prior to Visit  Medication Sig Dispense Refill  . polyethylene glycol-electrolytes (TRILYTE) 420 g solution Take 4,000 mLs by mouth as directed. 4000 mL 0  . triamcinolone cream (KENALOG) 0.1 % Apply 1 application topically 3 (three) times daily. As needed for rash or itching 80 g 2   No current facility-administered medications on file prior to visit.     No Known Allergies  Family History  Problem Relation Age of Onset  . Heart attack Father   . Stroke Father   . Breast cancer Sister   . Thyroid disease Neg Hx   . Colon cancer Neg Hx  BP 106/62   Pulse (!) 46   Ht 5\' 2"  (1.575 m)   Wt 137 lb (62.1 kg)   SpO2 95%   BMI 25.06 kg/m   Review of Systems She has slight tingling of the legs, but no numbness.      Objective:   Physical Exam VITAL SIGNS:  See vs page GENERAL: no distress eyes: no periorbital swelling; moderate bilat proptosis  NECK: There is no palpable thyroid enlargement.  No thyroid nodule is palpable.  No palpable lymphadenopathy at the anterior neck.  TSH=14     Assessment & Plan:  Hypothyroidism: she needs increased rx.  Vit-D deficiency: due for recheck.   Bradycardia, due to b-blocker.   Rash, uncertain  etiology, improved with rx.    Patient is advised the following: Patient Instructions  I have sent 3 prescriptions to your pharmacy: to increase the thyroid hormone pill and an anti-itch cream for you legs.   I hope the increase of the thyroid pill will make the skin cream unnecessary.   I'll you know when we get the vitamin-D result.  You can stop taking the metoprolol, as your heart rate is slow today.   Please come back for a follow-up appointment in 3 months.  I have requested for [facility] to do the blood tests a few days ahead of time.

## 2016-02-05 NOTE — Patient Instructions (Addendum)
I have sent 3 prescriptions to your pharmacy: to increase the thyroid hormone pill and an anti-itch cream for you legs.   I hope the increase of the thyroid pill will make the skin cream unnecessary.   I'll you know when we get the vitamin-D result.  You can stop taking the metoprolol, as your heart rate is slow today.   Please come back for a follow-up appointment in 3 months.  I have requested for [facility] to do the blood tests a few days ahead of time.

## 2016-02-13 LAB — SPECIMEN STATUS REPORT

## 2016-02-16 LAB — SPECIMEN STATUS REPORT

## 2016-02-16 LAB — VITAMIN D 25 HYDROXY (VIT D DEFICIENCY, FRACTURES): Vit D, 25-Hydroxy: 58 ng/mL (ref 30.0–100.0)

## 2016-02-19 ENCOUNTER — Ambulatory Visit (HOSPITAL_COMMUNITY)
Admission: RE | Admit: 2016-02-19 | Discharge: 2016-02-19 | Disposition: A | Discharge status: Home / Self Care | Payer: Medicare Other | Source: Ambulatory Visit | Attending: Family Medicine | Admitting: Family Medicine

## 2016-02-19 DIAGNOSIS — Z1231 Encounter for screening mammogram for malignant neoplasm of breast: Secondary | ICD-10-CM | POA: Insufficient documentation

## 2016-05-20 ENCOUNTER — Other Ambulatory Visit: Payer: Self-pay | Admitting: Endocrinology

## 2016-05-20 DIAGNOSIS — E890 Postprocedural hypothyroidism: Secondary | ICD-10-CM | POA: Diagnosis not present

## 2016-05-21 LAB — T4, FREE: Free T4: 1.85 ng/dL — ABNORMAL HIGH (ref 0.82–1.77)

## 2016-05-21 LAB — T3, FREE: T3, Free: 2.8 pg/mL (ref 2.0–4.4)

## 2016-05-21 LAB — TSH: TSH: 0.642 u[IU]/mL (ref 0.450–4.500)

## 2016-05-27 ENCOUNTER — Encounter: Payer: Self-pay | Admitting: Endocrinology

## 2016-05-27 ENCOUNTER — Ambulatory Visit (INDEPENDENT_AMBULATORY_CARE_PROVIDER_SITE_OTHER): Payer: Medicare Other | Admitting: Endocrinology

## 2016-05-27 VITALS — BP 132/68 | HR 57 | Ht 62.0 in | Wt 143.0 lb

## 2016-05-27 DIAGNOSIS — E890 Postprocedural hypothyroidism: Secondary | ICD-10-CM | POA: Diagnosis not present

## 2016-05-27 LAB — TSH: TSH: 0.5 u[IU]/mL (ref 0.35–4.50)

## 2016-05-27 LAB — T4, FREE: Free T4: 1.33 ng/dL (ref 0.60–1.60)

## 2016-05-27 NOTE — Patient Instructions (Addendum)
blood tests are requested for you today.  We'll let you know about the results. I would be happy to see you back here as needed.   

## 2016-05-27 NOTE — Progress Notes (Signed)
Subjective:    Patient ID: Courtney Arnold, female    DOB: 1941/01/29, 76 y.o.   MRN: [id number]  HPI Pt returns for f/u of hyperthyroidism (CT showed Graves Ophthalmopathy ; dx'ed 2017; after initial rx with tapazole, in June of 2017, she was rx'ed with RAI; in Oct of 2017, she was started on Synthroid).  She says she does take any OTC vitamins or supplements.  pt states she feels well in general.  Past Medical History:  Diagnosis Date  . Atrial fibrillation ([place]) 2011   Postop, spontaneous conversion to normal sinus after one hour  . Compression fracture 07/24/09   T12; kyphoplasty  . History of echocardiogram 5/11   EF 65%  . Hypertension   . Tobacco abuse     Past Surgical History:  Procedure Laterality Date  . BACK SURGERY    . BACK SURGERY  06/06/2015  . CHOLECYSTECTOMY N/A 04/25/2015   Procedure: LAPAROSCOPIC CHOLECYSTECTOMY WITH INTRAOPERATIVE CHOLANGIOGRAM;  Surgeon: Mickeal Skinner, MD;  [facility];  Service: General;  Laterality: N/A;  . COLONOSCOPY N/A 11/26/2015   Procedure: COLONOSCOPY;  Surgeon: Daneil Dolin, MD;  [facility];  Service: Endoscopy;  Laterality: N/A;  7:30 am  . ERCP N/A 04/14/2015   Procedure: ENDOSCOPIC RETROGRADE CHOLANGIOPANCREATOGRAPHY (ERCP) Biliary Sphincterotomy, 10x7 stent placement Dilated bilary system just not well seen;  Surgeon: Rogene Houston, MD;  Location: [facility];  Service: Endoscopy;  Laterality: N/A;  . ERCP N/A 06/12/2015   Procedure: ENDOSCOPIC RETROGRADE CHOLANGIOPANCREATOGRAPHY (ERCP);  Surgeon: Rogene Houston, MD;  [facility];  Service: Endoscopy;  Laterality: N/A;  . ESOPHAGOGASTRODUODENOSCOPY N/A 06/12/2015   Procedure: DIAGNOSTIC ESOPHAGOGASTRODUODENOSCOPY (EGD);  Surgeon: Rogene Houston, MD;  [facility];  Service: Endoscopy;  Laterality: N/A;  . STENT REMOVAL  06/12/2015   Procedure: STENT REMOVAL ;  Surgeon: Rogene Houston, MD;  [facility];  Service: Endoscopy;;     Social History   Social History  . Marital status: Widowed    Spouse name: N/A  . Number of children: 2  . Years of education: N/A   Occupational History  . Not on file.   Social History Main Topics  . Smoking status: Former Smoker    Packs/day: 0.15    Years: 20.00    Quit date: 03/08/2013  . Smokeless tobacco: Never Used  . Alcohol use No  . Drug use: No  . Sexual activity: Not Currently   Other Topics Concern  . Not on file   Social History Narrative   Active in gardens and does yard work.    Current Outpatient Prescriptions on File Prior to Visit  Medication Sig Dispense Refill  . triamcinolone cream (KENALOG) 0.1 % Apply 1 application topically 3 (three) times daily. As needed for rash or itching 80 g 2   No current facility-administered medications on file prior to visit.     No Known Allergies  Family History  Problem Relation Age of Onset  . Heart attack Father   . Stroke Father   . Breast cancer Sister   . Thyroid disease Neg Hx   . Colon cancer Neg Hx     BP 132/68   Pulse (!) 57   Ht 5\' 2"  (1.575 m)   Wt 143 lb (64.9 kg)   SpO2 96%   BMI 26.16 kg/m   Review of Systems No weight change    Objective:   Physical Exam VITAL SIGNS:  See vs page GENERAL: no distress eyes: no periorbital swelling; moderate bilat proptosis  NECK: There is no palpable thyroid enlargement.  No thyroid nodule is palpable.  No palpable lymphadenopathy at the anterior neck.    Lab Results  Component Value Date   TSH 0.50 05/27/2016   T3TOTAL 206 (H) 04/15/2015      Assessment & Plan:  Hypothyroidism: well-replaced. Please continue the same medication hyperthyroixinemia, uncertain etiology, resolved.

## 2016-05-29 MED ORDER — LEVOTHYROXINE SODIUM 125 MCG PO TABS: 125.0000 ug | ORAL_TABLET | Freq: Every day | ORAL | 3 refills | Status: DC

## 2016-12-08 DIAGNOSIS — Z23 Encounter for immunization: Secondary | ICD-10-CM | POA: Diagnosis not present

## 2017-01-14 ENCOUNTER — Other Ambulatory Visit (HOSPITAL_COMMUNITY): Payer: Self-pay | Admitting: Family Medicine

## 2017-01-14 DIAGNOSIS — Z1231 Encounter for screening mammogram for malignant neoplasm of breast: Secondary | ICD-10-CM

## 2017-02-10 DIAGNOSIS — E0500 Thyrotoxicosis with diffuse goiter without thyrotoxic crisis or storm: Secondary | ICD-10-CM | POA: Diagnosis not present

## 2017-02-10 DIAGNOSIS — H524 Presbyopia: Secondary | ICD-10-CM | POA: Diagnosis not present

## 2017-02-10 DIAGNOSIS — H2513 Age-related nuclear cataract, bilateral: Secondary | ICD-10-CM | POA: Diagnosis not present

## 2017-02-21 ENCOUNTER — Encounter (HOSPITAL_COMMUNITY): Payer: Self-pay

## 2017-02-21 ENCOUNTER — Ambulatory Visit (HOSPITAL_COMMUNITY)
Admission: RE | Admit: 2017-02-21 | Discharge: 2017-02-21 | Disposition: A | Discharge status: Home / Self Care | Payer: Medicare Other | Source: Ambulatory Visit | Attending: Family Medicine | Admitting: Family Medicine

## 2017-02-21 DIAGNOSIS — Z1231 Encounter for screening mammogram for malignant neoplasm of breast: Secondary | ICD-10-CM | POA: Insufficient documentation

## 2017-05-02 ENCOUNTER — Other Ambulatory Visit: Payer: Self-pay | Admitting: Endocrinology

## 2017-05-12 DIAGNOSIS — E0500 Thyrotoxicosis with diffuse goiter without thyrotoxic crisis or storm: Secondary | ICD-10-CM | POA: Diagnosis not present

## 2017-07-13 DIAGNOSIS — H02209 Unspecified lagophthalmos unspecified eye, unspecified eyelid: Secondary | ICD-10-CM | POA: Diagnosis not present

## 2017-07-13 DIAGNOSIS — E0580 Other thyrotoxicosis without thyrotoxic crisis or storm: Secondary | ICD-10-CM | POA: Diagnosis not present

## 2017-07-21 DIAGNOSIS — E0590 Thyrotoxicosis, unspecified without thyrotoxic crisis or storm: Secondary | ICD-10-CM | POA: Diagnosis not present

## 2017-07-21 DIAGNOSIS — H0520 Unspecified exophthalmos: Secondary | ICD-10-CM | POA: Diagnosis not present

## 2017-08-11 ENCOUNTER — Other Ambulatory Visit: Payer: Self-pay | Admitting: Endocrinology

## 2017-08-12 NOTE — Telephone Encounter (Signed)
Last ov 05/27/16 and no future scheduled refill or refuse please advise

## 2017-08-12 NOTE — Telephone Encounter (Signed)
No longer a patient here

## 2017-08-15 ENCOUNTER — Telehealth: Payer: Self-pay | Admitting: Endocrinology

## 2017-08-15 ENCOUNTER — Other Ambulatory Visit: Payer: Self-pay

## 2017-08-15 MED ORDER — LEVOTHYROXINE SODIUM 125 MCG PO TABS: ORAL_TABLET | ORAL | 0 refills | Status: DC

## 2017-08-15 NOTE — Telephone Encounter (Signed)
I have sent to patient;'s pharmacy.  

## 2017-08-15 NOTE — Telephone Encounter (Signed)
Pharmacy told daughter that RX for Levothyroxine 125 mg refill was not approved by Dr. Loanne Drilling (she had previously had a 50 day supply-with no refills typed on the bottle). Patient only has 2 pills left. Please send RX for above med to [facility] in [place].

## 2017-08-17 DIAGNOSIS — H02532 Eyelid retraction right lower eyelid: Secondary | ICD-10-CM | POA: Diagnosis not present

## 2017-08-17 DIAGNOSIS — H02535 Eyelid retraction left lower eyelid: Secondary | ICD-10-CM | POA: Diagnosis not present

## 2017-08-17 DIAGNOSIS — E0580 Other thyrotoxicosis without thyrotoxic crisis or storm: Secondary | ICD-10-CM | POA: Diagnosis not present

## 2017-08-17 DIAGNOSIS — H0520 Unspecified exophthalmos: Secondary | ICD-10-CM | POA: Diagnosis not present

## 2017-08-29 DIAGNOSIS — Z0001 Encounter for general adult medical examination with abnormal findings: Secondary | ICD-10-CM | POA: Diagnosis not present

## 2017-08-29 DIAGNOSIS — Z6827 Body mass index (BMI) 27.0-27.9, adult: Secondary | ICD-10-CM | POA: Diagnosis not present

## 2017-08-29 DIAGNOSIS — E663 Overweight: Secondary | ICD-10-CM | POA: Diagnosis not present

## 2017-08-29 DIAGNOSIS — Z0000 Encounter for general adult medical examination without abnormal findings: Secondary | ICD-10-CM | POA: Diagnosis not present

## 2017-08-29 DIAGNOSIS — Z1389 Encounter for screening for other disorder: Secondary | ICD-10-CM | POA: Diagnosis not present

## 2017-09-01 DIAGNOSIS — K838 Other specified diseases of biliary tract: Secondary | ICD-10-CM | POA: Insufficient documentation

## 2017-09-01 DIAGNOSIS — M545 Low back pain, unspecified: Secondary | ICD-10-CM | POA: Insufficient documentation

## 2017-09-01 DIAGNOSIS — H02884 Meibomian gland dysfunction left upper eyelid: Secondary | ICD-10-CM | POA: Diagnosis not present

## 2017-09-01 DIAGNOSIS — E0500 Thyrotoxicosis with diffuse goiter without thyrotoxic crisis or storm: Secondary | ICD-10-CM | POA: Diagnosis not present

## 2017-09-01 DIAGNOSIS — H02535 Eyelid retraction left lower eyelid: Secondary | ICD-10-CM | POA: Diagnosis not present

## 2017-09-01 DIAGNOSIS — H02881 Meibomian gland dysfunction right upper eyelid: Secondary | ICD-10-CM | POA: Diagnosis not present

## 2017-09-01 DIAGNOSIS — Z9889 Other specified postprocedural states: Secondary | ICD-10-CM | POA: Diagnosis not present

## 2017-09-01 DIAGNOSIS — H16213 Exposure keratoconjunctivitis, bilateral: Secondary | ICD-10-CM | POA: Diagnosis not present

## 2017-09-01 DIAGNOSIS — H05243 Constant exophthalmos, bilateral: Secondary | ICD-10-CM | POA: Diagnosis not present

## 2017-09-01 DIAGNOSIS — H02532 Eyelid retraction right lower eyelid: Secondary | ICD-10-CM | POA: Diagnosis not present

## 2017-09-01 DIAGNOSIS — H2513 Age-related nuclear cataract, bilateral: Secondary | ICD-10-CM | POA: Diagnosis not present

## 2017-11-11 ENCOUNTER — Other Ambulatory Visit: Payer: Self-pay | Admitting: Endocrinology

## 2017-11-11 NOTE — Telephone Encounter (Signed)
Options: Please refill x 1.  Ov is due, or: Get refill from PCP

## 2017-11-17 DIAGNOSIS — E0500 Thyrotoxicosis with diffuse goiter without thyrotoxic crisis or storm: Secondary | ICD-10-CM | POA: Diagnosis not present

## 2017-12-14 DIAGNOSIS — H02535 Eyelid retraction left lower eyelid: Secondary | ICD-10-CM | POA: Diagnosis not present

## 2017-12-14 DIAGNOSIS — H02532 Eyelid retraction right lower eyelid: Secondary | ICD-10-CM | POA: Diagnosis not present

## 2017-12-14 DIAGNOSIS — E0580 Other thyrotoxicosis without thyrotoxic crisis or storm: Secondary | ICD-10-CM | POA: Diagnosis not present

## 2018-02-06 ENCOUNTER — Other Ambulatory Visit: Payer: Self-pay | Admitting: Endocrinology

## 2018-02-06 NOTE — Telephone Encounter (Signed)
Please refill x 1 Ov is due  

## 2018-02-06 NOTE — Telephone Encounter (Signed)
Last OV 05/27/16 refill or refuse please advise

## 2018-02-06 NOTE — Telephone Encounter (Signed)
Done

## 2018-02-24 ENCOUNTER — Ambulatory Visit (INDEPENDENT_AMBULATORY_CARE_PROVIDER_SITE_OTHER): Payer: Medicare Other | Admitting: Endocrinology

## 2018-02-24 ENCOUNTER — Encounter: Payer: Self-pay | Admitting: Endocrinology

## 2018-02-24 VITALS — BP 102/62 | HR 84 | Temp 97.9°F | Ht 62.0 in | Wt 146.6 lb

## 2018-02-24 DIAGNOSIS — E890 Postprocedural hypothyroidism: Secondary | ICD-10-CM

## 2018-02-24 LAB — TSH: TSH: 0.07 u[IU]/mL — ABNORMAL LOW (ref 0.35–4.50)

## 2018-02-24 LAB — T4, FREE: Free T4: 1.21 ng/dL (ref 0.60–1.60)

## 2018-02-24 MED ORDER — LEVOTHYROXINE SODIUM 112 MCG PO TABS: 112.0000 ug | ORAL_TABLET | Freq: Every day | ORAL | 3 refills | Status: DC

## 2018-02-24 NOTE — Progress Notes (Signed)
Subjective:    Patient ID: Courtney Arnold, female    DOB: 1940/04/04, 77 y.o.   MRN: [id number]  HPI Pt returns for f/u of hyperthyroidism (CT showed Graves Ophthalmopathy; dx'ed 2017; after initial rx with tapazole, in June of 2017, she was rx'ed with RAI, in Oct of 2017, she was started on Synthroid).  pt states she feels well in general, except for anxiety.  Past Medical History:  Diagnosis Date  . Atrial fibrillation ([place]) 2011   Postop, spontaneous conversion to normal sinus after one hour  . Compression fracture 07/24/09   T12; kyphoplasty  . History of echocardiogram 5/11   EF 65%  . Hypertension   . Tobacco abuse     Past Surgical History:  Procedure Laterality Date  . BACK SURGERY    . BACK SURGERY  06/06/2015  . BREAST EXCISIONAL BIOPSY Left    50 years ago  benign  . CHOLECYSTECTOMY N/A 04/25/2015   Procedure: LAPAROSCOPIC CHOLECYSTECTOMY WITH INTRAOPERATIVE CHOLANGIOGRAM;  Surgeon: Mickeal Skinner, MD;  [facility];  Service: General;  Laterality: N/A;  . COLONOSCOPY N/A 11/26/2015   Procedure: COLONOSCOPY;  Surgeon: Daneil Dolin, MD;  [facility];  Service: Endoscopy;  Laterality: N/A;  7:30 am  . ERCP N/A 04/14/2015   Procedure: ENDOSCOPIC RETROGRADE CHOLANGIOPANCREATOGRAPHY (ERCP) Biliary Sphincterotomy, 10x7 stent placement Dilated bilary system just not well seen;  Surgeon: Rogene Houston, MD;  Location[facility];  Service: Endoscopy;  Laterality: N/A;  . ERCP N/A 06/12/2015   Procedure: ENDOSCOPIC RETROGRADE CHOLANGIOPANCREATOGRAPHY (ERCP);  Surgeon: Rogene Houston, MD;  [facility];  Service: Endoscopy;  Laterality: N/A;  . ESOPHAGOGASTRODUODENOSCOPY N/A 06/12/2015   Procedure: DIAGNOSTIC ESOPHAGOGASTRODUODENOSCOPY (EGD);  Surgeon: Rogene Houston, MD;  [facility];  Service: Endoscopy;  Laterality: N/A;  . STENT REMOVAL  06/12/2015   Procedure: STENT REMOVAL ;  Surgeon: Rogene Houston, MD;  [facility];   Service: Endoscopy;;    Social History   Socioeconomic History  . Marital status: Widowed    Spouse name: Not on file  . Number of children: 2  . Years of education: Not on file  . Highest education level: Not on file  Occupational History  . Not on file  Social Needs  . Financial resource strain: Not on file  . Food insecurity:    Worry: Not on file    Inability: Not on file  . Transportation needs:    Medical: Not on file    Non-medical: Not on file  Tobacco Use  . Smoking status: Former Smoker    Packs/day: 0.15    Years: 20.00    Pack years: 3.00    Last attempt to quit: 03/08/2013    Years since quitting: 4.9  . Smokeless tobacco: Never Used  Substance and Sexual Activity  . Alcohol use: No    Alcohol/week: 0.0 standard drinks  . Drug use: No  . Sexual activity: Not Currently  Lifestyle  . Physical activity:    Days per week: Not on file    Minutes per session: Not on file  . Stress: Not on file  Relationships  . Social connections:    Talks on phone: Not on file    Gets together: Not on file    Attends religious service: Not on file    Active member of club or organization: Not on file    Attends meetings of clubs or organizations: Not on file  Relationship status: Not on file  . Intimate partner violence:    Fear of current or ex partner: Not on file    Emotionally abused: Not on file    Physically abused: Not on file    Forced sexual activity: Not on file  Other Topics Concern  . Not on file  Social History Narrative   Active in gardens and does yard work.    No current outpatient medications on file prior to visit.   No current facility-administered medications on file prior to visit.     No Known Allergies  Family History  Problem Relation Age of Onset  . Heart attack Father   . Stroke Father   . Breast cancer Sister   . Thyroid disease Neg Hx   . Colon cancer Neg Hx     BP 102/62 (BP Location: Left Arm, Patient Position: Sitting,  Cuff Size: Normal)   Pulse 84   Temp 97.9 F (36.6 C) (Oral)   Ht 5\' 2"  (1.575 m)   Wt 146 lb 9.6 oz (66.5 kg)   BMI 26.81 kg/m    Review of Systems Denies palpitations.      Objective:   Physical Exam VITAL SIGNS:  See vs page GENERAL: no distress eyes: no periorbital swelling; moderate bilat proptosis  NECK: There is no palpable thyroid enlargement.  No thyroid nodule is palpable.  No palpable lymphadenopathy at the anterior neck.     Lab Results  Component Value Date   TSH 0.07 (L) 02/24/2018   T3TOTAL 206 (H) 04/15/2015      Assessment & Plan:  Hypothyroidism, overreplaced.  I have sent a prescription to your pharmacy, to reduce synthroid.  Patient Instructions  blood tests are requested for you today.  We'll let you know about the results. Please come back for a follow-up appointment in 1 year, or you could have this checked in the future with Dr Hilma Favors.

## 2018-02-24 NOTE — Patient Instructions (Signed)
blood tests are requested for you today.  We'll let you know about the results. Please come back for a follow-up appointment in 1 year, or you could have this checked in the future with Dr Hilma Favors.

## 2018-04-11 ENCOUNTER — Other Ambulatory Visit: Payer: Self-pay | Admitting: Endocrinology

## 2018-04-11 ENCOUNTER — Other Ambulatory Visit (INDEPENDENT_AMBULATORY_CARE_PROVIDER_SITE_OTHER): Payer: Medicare Other

## 2018-04-11 DIAGNOSIS — E890 Postprocedural hypothyroidism: Secondary | ICD-10-CM

## 2018-04-11 LAB — TSH: TSH: 0.19 u[IU]/mL — ABNORMAL LOW (ref 0.35–4.50)

## 2018-04-11 LAB — T4, FREE: Free T4: 1.48 ng/dL (ref 0.60–1.60)

## 2018-04-11 MED ORDER — LEVOTHYROXINE SODIUM 100 MCG PO TABS: 100.0000 ug | ORAL_TABLET | Freq: Every day | ORAL | 5 refills | Status: DC

## 2018-05-08 ENCOUNTER — Other Ambulatory Visit: Payer: Self-pay | Admitting: Endocrinology

## 2018-05-22 DIAGNOSIS — H2513 Age-related nuclear cataract, bilateral: Secondary | ICD-10-CM | POA: Diagnosis not present

## 2018-05-22 DIAGNOSIS — H5203 Hypermetropia, bilateral: Secondary | ICD-10-CM | POA: Diagnosis not present

## 2018-11-06 DIAGNOSIS — M1991 Primary osteoarthritis, unspecified site: Secondary | ICD-10-CM | POA: Diagnosis not present

## 2018-11-06 DIAGNOSIS — Z0000 Encounter for general adult medical examination without abnormal findings: Secondary | ICD-10-CM | POA: Diagnosis not present

## 2018-11-06 DIAGNOSIS — Z6826 Body mass index (BMI) 26.0-26.9, adult: Secondary | ICD-10-CM | POA: Diagnosis not present

## 2018-11-06 DIAGNOSIS — E782 Mixed hyperlipidemia: Secondary | ICD-10-CM | POA: Diagnosis not present

## 2018-11-06 DIAGNOSIS — R300 Dysuria: Secondary | ICD-10-CM | POA: Diagnosis not present

## 2018-11-06 DIAGNOSIS — E0590 Thyrotoxicosis, unspecified without thyrotoxic crisis or storm: Secondary | ICD-10-CM | POA: Diagnosis not present

## 2018-11-06 DIAGNOSIS — E663 Overweight: Secondary | ICD-10-CM | POA: Diagnosis not present

## 2018-11-06 DIAGNOSIS — Z1389 Encounter for screening for other disorder: Secondary | ICD-10-CM | POA: Diagnosis not present

## 2018-11-06 DIAGNOSIS — N342 Other urethritis: Secondary | ICD-10-CM | POA: Diagnosis not present

## 2018-11-14 DIAGNOSIS — E611 Iron deficiency: Secondary | ICD-10-CM | POA: Diagnosis not present

## 2018-11-14 DIAGNOSIS — E538 Deficiency of other specified B group vitamins: Secondary | ICD-10-CM | POA: Diagnosis not present

## 2018-11-14 DIAGNOSIS — D649 Anemia, unspecified: Secondary | ICD-10-CM | POA: Diagnosis not present

## 2018-11-20 DIAGNOSIS — E611 Iron deficiency: Secondary | ICD-10-CM | POA: Diagnosis not present

## 2018-11-28 ENCOUNTER — Other Ambulatory Visit (HOSPITAL_COMMUNITY): Payer: Self-pay | Admitting: Internal Medicine

## 2018-11-28 ENCOUNTER — Other Ambulatory Visit (HOSPITAL_COMMUNITY): Payer: Self-pay | Admitting: Family Medicine

## 2018-11-28 DIAGNOSIS — Z1231 Encounter for screening mammogram for malignant neoplasm of breast: Secondary | ICD-10-CM

## 2018-11-30 ENCOUNTER — Other Ambulatory Visit: Payer: Self-pay

## 2018-11-30 ENCOUNTER — Ambulatory Visit (HOSPITAL_COMMUNITY)
Admission: RE | Admit: 2018-11-30 | Discharge: 2018-11-30 | Disposition: A | Discharge status: Home / Self Care | Payer: Medicare Other | Source: Ambulatory Visit | Attending: Internal Medicine | Admitting: Internal Medicine

## 2018-11-30 DIAGNOSIS — Z1231 Encounter for screening mammogram for malignant neoplasm of breast: Secondary | ICD-10-CM | POA: Insufficient documentation

## 2018-12-01 ENCOUNTER — Other Ambulatory Visit: Payer: Self-pay | Admitting: Endocrinology

## 2018-12-04 ENCOUNTER — Other Ambulatory Visit: Payer: Self-pay | Admitting: Endocrinology

## 2018-12-20 DIAGNOSIS — D510 Vitamin B12 deficiency anemia due to intrinsic factor deficiency: Secondary | ICD-10-CM | POA: Diagnosis not present

## 2019-01-23 DIAGNOSIS — E538 Deficiency of other specified B group vitamins: Secondary | ICD-10-CM | POA: Diagnosis not present

## 2019-01-23 DIAGNOSIS — Z23 Encounter for immunization: Secondary | ICD-10-CM | POA: Diagnosis not present

## 2019-02-15 ENCOUNTER — Other Ambulatory Visit: Payer: Self-pay

## 2019-02-19 ENCOUNTER — Ambulatory Visit (INDEPENDENT_AMBULATORY_CARE_PROVIDER_SITE_OTHER): Payer: Medicare Other | Admitting: Endocrinology

## 2019-02-19 ENCOUNTER — Other Ambulatory Visit: Payer: Self-pay

## 2019-02-19 ENCOUNTER — Encounter: Payer: Self-pay | Admitting: Endocrinology

## 2019-02-19 VITALS — BP 128/70 | HR 75 | Ht 62.0 in | Wt 145.4 lb

## 2019-02-19 DIAGNOSIS — E890 Postprocedural hypothyroidism: Secondary | ICD-10-CM | POA: Diagnosis not present

## 2019-02-19 LAB — T4, FREE: Free T4: 1.48 ng/dL (ref 0.60–1.60)

## 2019-02-19 LAB — TSH: TSH: 0.64 u[IU]/mL (ref 0.35–4.50)

## 2019-02-19 MED ORDER — LEVOTHYROXINE SODIUM 100 MCG PO TABS: 100.0000 ug | ORAL_TABLET | Freq: Every day | ORAL | 3 refills | Status: DC

## 2019-02-19 NOTE — Progress Notes (Signed)
Subjective:    Patient ID: Courtney Arnold, female    DOB: [birth year], 78 y.o.   MRN: [id number]  HPI Pt returns for f/u of hyperthyroidism (head CT showed Graves Ophthalmopathy; dx'ed 2017; after a few mos of tapazole, she took RAI; she has been on Synthroid since then).  pt states she feels well in general.   Past Medical History:  Diagnosis Date  . Atrial fibrillation ([place]) 2011   Postop, spontaneous conversion to normal sinus after one hour  . Compression fracture 07/24/09   T12; kyphoplasty  . History of echocardiogram 5/11   EF 65%  . Hypertension   . Tobacco abuse     Past Surgical History:  Procedure Laterality Date  . BACK SURGERY    . BACK SURGERY  06/06/2015  . BREAST EXCISIONAL BIOPSY Left    50 years ago  benign  . CHOLECYSTECTOMY N/A 04/25/2015   Procedure: LAPAROSCOPIC CHOLECYSTECTOMY WITH INTRAOPERATIVE CHOLANGIOGRAM;  Surgeon: Mickeal Skinner, MD;  [facility];  Service: General;  Laterality: N/A;  . COLONOSCOPY N/A 11/26/2015   Procedure: COLONOSCOPY;  Surgeon: Daneil Dolin, MD;  [facility];  Service: Endoscopy;  Laterality: N/A;  7:30 am  . ERCP N/A 04/14/2015   Procedure: ENDOSCOPIC RETROGRADE CHOLANGIOPANCREATOGRAPHY (ERCP) Biliary Sphincterotomy, 10x7 stent placement Dilated bilary system just not well seen;  Surgeon: Rogene Houston, MD;  [facility];  Service: Endoscopy;  Laterality: N/A;  . ERCP N/A 06/12/2015   Procedure: ENDOSCOPIC RETROGRADE CHOLANGIOPANCREATOGRAPHY (ERCP);  Surgeon: Rogene Houston, MD;  [facility];  Service: Endoscopy;  Laterality: N/A;  . ESOPHAGOGASTRODUODENOSCOPY N/A 06/12/2015   Procedure: DIAGNOSTIC ESOPHAGOGASTRODUODENOSCOPY (EGD);  Surgeon: Rogene Houston, MD;  [facility];  Service: Endoscopy;  Laterality: N/A;  . STENT REMOVAL  06/12/2015   Procedure: STENT REMOVAL ;  Surgeon: Rogene Houston, MD;  [facility];  Service: Endoscopy;;    Social History    Socioeconomic History  . Marital status: Widowed    Spouse name: Not on file  . Number of children: 2  . Years of education: Not on file  . Highest education level: Not on file  Occupational History  . Not on file  Tobacco Use  . Smoking status: Former Smoker    Packs/day: 0.15    Years: 20.00    Pack years: 3.00    Quit date: 03/08/2013    Years since quitting: 5.9  . Smokeless tobacco: Never Used  Substance and Sexual Activity  . Alcohol use: No    Alcohol/week: 0.0 standard drinks  . Drug use: No  . Sexual activity: Not Currently  Other Topics Concern  . Not on file  Social History Narrative   Active in gardens and does yard work.   Social Determinants of Health   Financial Resource Strain:   . Difficulty of Paying Living Expenses: Not on file  Food Insecurity:   . Worried About Charity fundraiser in the Last Year: Not on file  . Ran Out of Food in the Last Year: Not on file  Transportation Needs:   . Lack of Transportation (Medical): Not on file  . Lack of Transportation (Non-Medical): Not on file  Physical Activity:   . Days of Exercise per Week: Not on file  . Minutes of Exercise per Session: Not on file  Stress:   . Feeling of Stress : Not on file  Social Connections:   . Frequency of Communication with  Friends and Family: Not on file  . Frequency of Social Gatherings with Friends and Family: Not on file  . Attends Religious Services: Not on file  . Active Member of Clubs or Organizations: Not on file  . Attends Archivist Meetings: Not on file  . Marital Status: Not on file  Intimate Partner Violence:   . Fear of Current or Ex-Partner: Not on file  . Emotionally Abused: Not on file  . Physically Abused: Not on file  . Sexually Abused: Not on file    No current outpatient medications on file prior to visit.   No current facility-administered medications on file prior to visit.    No Known Allergies  Family History  Problem Relation  Age of Onset  . Heart attack Father   . Stroke Father   . Breast cancer Sister   . Thyroid disease Neg Hx   . Colon cancer Neg Hx     BP 128/70 (BP Location: Right Arm, Patient Position: Sitting, Cuff Size: Normal)   Pulse 75   Ht 5\' 2"  (1.575 m)   Wt 145 lb 6.4 oz (66 kg)   SpO2 94%   BMI 26.59 kg/m    Review of Systems denies palpitations and tremor.      Objective:   Physical Exam VITAL SIGNS:  See vs page.   GENERAL: no distress.   NECK: There is no palpable thyroid enlargement.  No thyroid nodule is palpable.  No palpable lymphadenopathy at the anterior neck.    Lab Results  Component Value Date   TSH 0.64 02/19/2019   T3TOTAL 206 (H) 04/15/2015      Assessment & Plan:  Hypothyroidism: well-replaced.  AF: in this setting, she needs to maintain euthyroidism.  Patient Instructions  blood tests are requested for you today.  We'll let you know about the results. Please come back for a follow-up appointment in 1 year.

## 2019-02-19 NOTE — Patient Instructions (Signed)
blood tests are requested for you today.  We'll let you know about the results. Please come back for a follow-up appointment in 1 year.   

## 2019-02-20 DIAGNOSIS — D510 Vitamin B12 deficiency anemia due to intrinsic factor deficiency: Secondary | ICD-10-CM | POA: Diagnosis not present

## 2019-02-20 DIAGNOSIS — E538 Deficiency of other specified B group vitamins: Secondary | ICD-10-CM | POA: Diagnosis not present

## 2019-02-26 ENCOUNTER — Ambulatory Visit: Payer: Medicare Other | Admitting: Endocrinology

## 2019-03-23 DIAGNOSIS — S76111A Strain of right quadriceps muscle, fascia and tendon, initial encounter: Secondary | ICD-10-CM | POA: Diagnosis not present

## 2019-03-26 ENCOUNTER — Other Ambulatory Visit (HOSPITAL_COMMUNITY): Payer: Self-pay | Admitting: Physician Assistant

## 2019-03-26 ENCOUNTER — Ambulatory Visit (HOSPITAL_COMMUNITY)
Admission: RE | Admit: 2019-03-26 | Discharge: 2019-03-26 | Disposition: A | Discharge status: Home / Self Care | Payer: Medicare Other | Source: Ambulatory Visit | Attending: Physician Assistant | Admitting: Physician Assistant

## 2019-03-26 ENCOUNTER — Other Ambulatory Visit: Payer: Self-pay

## 2019-03-26 DIAGNOSIS — M1611 Unilateral primary osteoarthritis, right hip: Secondary | ICD-10-CM | POA: Diagnosis not present

## 2019-03-26 DIAGNOSIS — M79604 Pain in right leg: Secondary | ICD-10-CM

## 2019-03-26 DIAGNOSIS — M25551 Pain in right hip: Secondary | ICD-10-CM | POA: Diagnosis not present

## 2019-03-26 DIAGNOSIS — M79651 Pain in right thigh: Secondary | ICD-10-CM | POA: Diagnosis not present

## 2019-04-06 DIAGNOSIS — M1711 Unilateral primary osteoarthritis, right knee: Secondary | ICD-10-CM | POA: Diagnosis not present

## 2019-04-06 DIAGNOSIS — M179 Osteoarthritis of knee, unspecified: Secondary | ICD-10-CM | POA: Diagnosis not present

## 2019-04-06 DIAGNOSIS — M25561 Pain in right knee: Secondary | ICD-10-CM | POA: Diagnosis not present

## 2019-04-06 DIAGNOSIS — M1710 Unilateral primary osteoarthritis, unspecified knee: Secondary | ICD-10-CM | POA: Insufficient documentation

## 2019-04-10 DIAGNOSIS — M1711 Unilateral primary osteoarthritis, right knee: Secondary | ICD-10-CM | POA: Diagnosis not present

## 2019-04-13 DIAGNOSIS — Z23 Encounter for immunization: Secondary | ICD-10-CM | POA: Diagnosis not present

## 2019-05-01 ENCOUNTER — Encounter (HOSPITAL_COMMUNITY): Payer: Self-pay | Admitting: *Deleted

## 2019-05-01 ENCOUNTER — Emergency Department (HOSPITAL_COMMUNITY): Payer: Medicare Other

## 2019-05-01 ENCOUNTER — Other Ambulatory Visit: Payer: Self-pay

## 2019-05-01 ENCOUNTER — Emergency Department (HOSPITAL_COMMUNITY)
Admission: EM | Admit: 2019-05-01 | Discharge: 2019-05-01 | Disposition: A | Discharge status: Home / Self Care | Payer: Medicare Other | Attending: Emergency Medicine | Admitting: Emergency Medicine

## 2019-05-01 DIAGNOSIS — E039 Hypothyroidism, unspecified: Secondary | ICD-10-CM | POA: Insufficient documentation

## 2019-05-01 DIAGNOSIS — R109 Unspecified abdominal pain: Secondary | ICD-10-CM | POA: Diagnosis not present

## 2019-05-01 DIAGNOSIS — Z79899 Other long term (current) drug therapy: Secondary | ICD-10-CM | POA: Insufficient documentation

## 2019-05-01 DIAGNOSIS — I10 Essential (primary) hypertension: Secondary | ICD-10-CM | POA: Insufficient documentation

## 2019-05-01 DIAGNOSIS — N281 Cyst of kidney, acquired: Secondary | ICD-10-CM | POA: Diagnosis not present

## 2019-05-01 DIAGNOSIS — D72819 Decreased white blood cell count, unspecified: Secondary | ICD-10-CM

## 2019-05-01 DIAGNOSIS — D649 Anemia, unspecified: Secondary | ICD-10-CM

## 2019-05-01 DIAGNOSIS — Z87891 Personal history of nicotine dependence: Secondary | ICD-10-CM | POA: Diagnosis not present

## 2019-05-01 DIAGNOSIS — D696 Thrombocytopenia, unspecified: Secondary | ICD-10-CM | POA: Diagnosis not present

## 2019-05-01 DIAGNOSIS — R799 Abnormal finding of blood chemistry, unspecified: Secondary | ICD-10-CM | POA: Diagnosis not present

## 2019-05-01 DIAGNOSIS — K5730 Diverticulosis of large intestine without perforation or abscess without bleeding: Secondary | ICD-10-CM | POA: Diagnosis not present

## 2019-05-01 LAB — CBC WITH DIFFERENTIAL/PLATELET
Abs Immature Granulocytes: 0.01 10*3/uL (ref 0.00–0.07)
Basophils Absolute: 0 10*3/uL (ref 0.0–0.1)
Basophils Relative: 0 %
Eosinophils Absolute: 0.1 10*3/uL (ref 0.0–0.5)
Eosinophils Relative: 2 %
HCT: 27.1 % — ABNORMAL LOW (ref 36.0–46.0)
Hemoglobin: 8.5 g/dL — ABNORMAL LOW (ref 12.0–15.0)
Immature Granulocytes: 0 %
Lymphocytes Relative: 40 %
Lymphs Abs: 1.5 10*3/uL (ref 0.7–4.0)
MCH: 28.1 pg (ref 26.0–34.0)
MCHC: 31.4 g/dL (ref 30.0–36.0)
MCV: 89.4 fL (ref 80.0–100.0)
Monocytes Absolute: 0.4 10*3/uL (ref 0.1–1.0)
Monocytes Relative: 10 %
Neutro Abs: 1.8 10*3/uL (ref 1.7–7.7)
Neutrophils Relative %: 48 %
Platelets: 146 10*3/uL — ABNORMAL LOW (ref 150–400)
RBC: 3.03 MIL/uL — ABNORMAL LOW (ref 3.87–5.11)
RDW: 15 % (ref 11.5–15.5)
WBC: 3.8 10*3/uL — ABNORMAL LOW (ref 4.0–10.5)
nRBC: 0 % (ref 0.0–0.2)

## 2019-05-01 LAB — COMPREHENSIVE METABOLIC PANEL
ALT: 12 U/L (ref 0–44)
AST: 14 U/L — ABNORMAL LOW (ref 15–41)
Albumin: 3.6 g/dL (ref 3.5–5.0)
Alkaline Phosphatase: 71 U/L (ref 38–126)
Anion gap: 8 (ref 5–15)
BUN: 35 mg/dL — ABNORMAL HIGH (ref 8–23)
CO2: 29 mmol/L (ref 22–32)
Calcium: 8.9 mg/dL (ref 8.9–10.3)
Chloride: 98 mmol/L (ref 98–111)
Creatinine, Ser: 0.91 mg/dL (ref 0.44–1.00)
GFR calc Af Amer: >60 mL/min (ref >60)
GFR calc non Af Amer: >60 mL/min (ref >60)
Glucose, Bld: 103 mg/dL — ABNORMAL HIGH (ref 70–99)
Potassium: 3.5 mmol/L (ref 3.5–5.1)
Sodium: 135 mmol/L (ref 135–145)
Total Bilirubin: 0.4 mg/dL (ref 0.3–1.2)
Total Protein: 6.3 g/dL — ABNORMAL LOW (ref 6.5–8.1)

## 2019-05-01 LAB — URINALYSIS, ROUTINE W REFLEX MICROSCOPIC
Bacteria, UA: NONE SEEN
Bilirubin Urine: NEGATIVE
Glucose, UA: NEGATIVE mg/dL
Ketones, ur: NEGATIVE mg/dL
Nitrite: NEGATIVE
Protein, ur: NEGATIVE mg/dL
Specific Gravity, Urine: 1.016 (ref 1.005–1.030)
pH: 5 (ref 5.0–8.0)

## 2019-05-01 LAB — POC OCCULT BLOOD, ED: Fecal Occult Bld: NEGATIVE

## 2019-05-01 MED ORDER — HYDROCODONE-ACETAMINOPHEN 5-325 MG PO TABS: 1.0000 | ORAL_TABLET | Freq: Four times a day (QID) | ORAL | 0 refills | Status: DC | PRN

## 2019-05-01 MED ORDER — HYDROCODONE-ACETAMINOPHEN 5-325 MG PO TABS
1.0000 | ORAL_TABLET | Freq: Once | ORAL | Status: AC
  Administered 2019-05-01: 1 via ORAL
  Filled 2019-05-01: qty 1

## 2019-05-01 NOTE — ED Provider Notes (Signed)
Care assumed from Dr. Roxanne Mins.  Patient with vertebral compression fractures here with left flank pain for the past 1 week.  No trauma.  She has a CT scan concerning for lytic lesions of her pelvis.  She is awaiting urinalysis She also has slight anemia elevated BUN with normal creatinine.  Patient was informed of her CT results and need for follow-up with her PCP.  UA is negative for infection but does show some hematuria.  Culture is sent.  Hemoccult is negative.  Results discussed with patient as well as her daughter by phone [name] with patient's permission.  Discussed concerning findings of lytic bone lesions on CT scan which may or may not be responsible for her pain.  Discussed need for follow-up with PCP as well as oncology.  Prescription sent to pharmacy by Dr. Roxanne Mins. Return precautions discussed  BP 137/61 (BP Location: Right Arm) Comment: Simultaneous filing. User may not have seen previous data.  Pulse 94 Comment: Simultaneous filing. User may not have seen previous data.  Temp 97.7 F (36.5 C) (Oral)   Resp 20   Ht 5\' 2"  (1.575 m)   Wt 59 kg   SpO2 98% Comment: Simultaneous filing. User may not have seen previous data.  BMI 23.78 kg/m       Ezequiel Essex, MD 05/01/19 [phone]

## 2019-05-01 NOTE — ED Notes (Signed)
At rest, pt rates pain as 2/10. Assisted pt to bathroom (she was unable to urinate more than a few drops) and her pain increased to 10/10.

## 2019-05-01 NOTE — ED Provider Notes (Signed)
[facility] Provider Note   CSN: [id number] Arrival date & time: 05/01/19  0346   History Chief Complaint  Patient presents with  . Back Pain    Courtney Arnold is a 79 y.o. female.  The history is provided by the patient.  Back Pain She has history of hypertension and vertebral compression fractures and comes in complaining of pain in the left flank area for the last week.  Pain started after she slept in a chair overnight.  Pain does not radiate.  There is no associated weakness, numbness, tingling.  She denies any urinary urgency, frequency, tenesmus, dysuria.  She has been taking ibuprofen which does give slight, temporary relief.  She came in tonight because she is unable to get into her bed because of pain.  She denies any trauma or overuse.  Past Medical History:  Diagnosis Date  . Atrial fibrillation ([place]) 2011   Postop, spontaneous conversion to normal sinus after one hour  . Compression fracture 07/24/09   T12; kyphoplasty  . History of echocardiogram 5/11   EF 65%  . Hypertension   . Tobacco abuse     Patient Active Problem List   Diagnosis Date Noted  . Vitamin D deficiency 02/03/2016  . 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 History of colonic polyps 11/06/2015  . 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 Cholecystitis, acute 04/25/2015  . Anemia 04/25/2015  . Lower back pain   . Nausea & vomiting 04/21/2015  . History of biliary stent insertion 04/21/2015  . Volume depletion 04/21/2015  . Hypokalemia 04/21/2015  . AP (abdominal pain)   . Compression fracture of L2 lumbar vertebra (HCC)   . Uncontrollable vomiting   . Common bile duct dilation   . Cholelithiasis 04/12/2015  . Compression fracture 04/12/2015  . Abdominal pain 04/12/2015  . TOBACCO ABUSE 08/21/2009  . ATRIAL FIBRILLATION, HX OF 08/21/2009    Past Surgical History:  Procedure Laterality Date  . BACK SURGERY    . BACK SURGERY  06/06/2015  .  BREAST EXCISIONAL BIOPSY Left    50 years ago  benign  . CHOLECYSTECTOMY N/A 04/25/2015   Procedure: LAPAROSCOPIC CHOLECYSTECTOMY WITH INTRAOPERATIVE CHOLANGIOGRAM;  Surgeon: Mickeal Skinner, MD;  Location: [facility];  Service: General;  Laterality: N/A;  . COLONOSCOPY N/A 11/26/2015   Procedure: COLONOSCOPY;  Surgeon: Daneil Dolin, MD;  [facility];  Service: Endoscopy;  Laterality: N/A;  7:30 am  . ERCP N/A 04/14/2015   Procedure: ENDOSCOPIC RETROGRADE CHOLANGIOPANCREATOGRAPHY (ERCP) Biliary Sphincterotomy, 10x7 stent placement Dilated bilary system just not well seen;  Surgeon: Rogene Houston, MD;  Location: [facility];  Service: Endoscopy;  Laterality: N/A;  . ERCP N/A 06/12/2015   Procedure: ENDOSCOPIC RETROGRADE CHOLANGIOPANCREATOGRAPHY (ERCP);  Surgeon: Rogene Houston, MD;  [facility];  Service: Endoscopy;  Laterality: N/A;  . ESOPHAGOGASTRODUODENOSCOPY N/A 06/12/2015   Procedure: DIAGNOSTIC ESOPHAGOGASTRODUODENOSCOPY (EGD);  Surgeon: Rogene Houston, MD;  [facility];  Service: Endoscopy;  Laterality: N/A;  . STENT REMOVAL  06/12/2015   Procedure: STENT REMOVAL ;  Surgeon: Rogene Houston, MD;  [facility];  Service: Endoscopy;;     OB History   No obstetric history on file.     Family History  Problem Relation Age of Onset  . Heart attack Father   . Stroke Father   . Breast cancer Sister   . Thyroid disease Neg Hx   . Colon cancer Neg Hx  Social History   Tobacco Use  . Smoking status: Former Smoker    Packs/day: 0.15    Years: 20.00    Pack years: 3.00    Quit date: 03/08/2013    Years since quitting: 6.1  . Smokeless tobacco: Never Used  Substance Use Topics  . Alcohol use: No    Alcohol/week: 0.0 standard drinks  . Drug use: No    Home Medications Prior to Admission medications   Medication Sig Start Date End Date Taking? Authorizing Provider  levothyroxine (SYNTHROID) 100 MCG tablet Take 1 tablet (100 mcg total)  by mouth daily. 02/19/19   Renato Shin, MD    Allergies    Patient has no known allergies.  Review of Systems   Review of Systems  Musculoskeletal: Positive for back pain.  All other systems reviewed and are negative.   Physical Exam Updated Vital Signs BP 137/61 (BP Location: Right Arm)   Pulse 91   Temp 97.7 F (36.5 C) (Oral)   Resp 20   Ht 5\' 2"  (1.575 m)   Wt 59 kg   SpO2 98%   BMI 23.78 kg/m   Physical Exam Vitals and nursing note reviewed.   79 year old female, resting comfortably and in no acute distress. Vital signs are normal. Oxygen saturation is 98%, which is normal. Head is normocephalic and atraumatic. PERRLA, EOMI. Oropharynx is clear. Neck is nontender and supple without adenopathy or JVD. Back is moderately tender in the lower left thoracic area and mid and upper left lumbar area.  There is no tenderness along the lumbar or thoracic spine.  Lungs are clear without rales, wheezes, or rhonchi. Chest is nontender. Heart has regular rate and rhythm without murmur. Abdomen is soft, flat, nontender without masses or hepatosplenomegaly and peristalsis is normoactive. Extremities have no cyanosis or edema, full range of motion is present. Skin is warm and dry without rash. Neurologic: Mental status is normal, cranial nerves are intact, there are no motor or sensory deficits.  ED Results / Procedures / Treatments   Labs (all labs ordered are listed, but only abnormal results are displayed) Labs Reviewed  COMPREHENSIVE METABOLIC PANEL - Abnormal; Notable for the following components:      Result Value   Glucose, Bld 103 (*)    BUN 35 (*)    Total Protein 6.3 (*)    AST 14 (*)    All other components within normal limits  CBC WITH DIFFERENTIAL/PLATELET - Abnormal; Notable for the following components:   WBC 3.8 (*)    RBC 3.03 (*)    Hemoglobin 8.5 (*)    HCT 27.1 (*)    Platelets 146 (*)    All other components within normal limits  URINALYSIS,  ROUTINE W REFLEX MICROSCOPIC    Radiology CT Renal Stone Study  Result Date: 05/01/2019 CLINICAL DATA:  Lower back pain for several days EXAM: CT ABDOMEN AND PELVIS WITHOUT CONTRAST TECHNIQUE: Multidetector CT imaging of the abdomen and pelvis was performed following the standard protocol without IV contrast. COMPARISON:  Lower back pain, unable to sleep FINDINGS: Lower chest: Some bandlike areas of atelectasis or scarring present in the lower chest. No consolidation or effusion. Normal heart size. No pericardial effusion. Hepatobiliary: Punctate calcification present in the anterior left lobe liver, stable from prior. No focal concerning liver lesion. Smooth hepatic surface contour. Extensive intra and extrahepatic biliary ductal dilatation, greater than expected for age or reservoir effect. No visible intraductal gallstones. No residual pneumatosis. Pancreas: Unremarkable. No  pancreatic ductal dilatation or surrounding inflammatory changes. Spleen: Normal in size without focal abnormality. Adrenals/Urinary Tract: Nodular thickening of the adrenal glands without discrete adrenal lesion, similar to prior. Enlarging simple attenuation cyst in the anterior interpolar left kidney measuring 4.2 cm in size and 5 HU attenuation. No worrisome features. Additional 1.1 cm subcapsular cyst in the right kidney measuring fluid attenuation. No visible or contour deforming worrisome renal lesions. No urolithiasis or hydronephrosis. Urinary bladder is unremarkable. Stomach/Bowel: Fluid-filled hiatal hernia. Distal stomach is unremarkable. Duodenal sweep takes a normal course across the abdomen. No small bowel dilatation or wall thickening. Mild distal small bowel fecalization. A normal appendix is visualized. Moderate stool burden in the colon. No colonic dilatation or wall thickening. Scattered colonic diverticula without focal pericolonic inflammation to suggest diverticulitis. Vascular/Lymphatic: Atherosclerotic plaque  within the normal caliber aorta. No suspicious or enlarged lymph nodes in the included lymphatic chains. Reproductive: Normal appearance of the uterus and adnexal structures. Other: No free fluid.  No free air.  No bowel containing hernias. Musculoskeletal: Unchanged appearance of an L2 superior endplate compression deformity with approximately 40% height loss superiorly. Prior T12 compression deformity with vertebroplasty changes is also stable. No new fracture or compression deformity is seen. No left though hilar IMPRESSION: Expansile lytic lesions within the sacrum with some endosteal scalloping. Appearance is concerning for possible myeloma versus metastatic disease. Correlation with patient history and serologies is recommended. No obstructive urolithiasis or hydronephrosis. Fluid-filled hiatal hernia, correlate for symptoms of reflux. Small bowel fecalization, correlate for slowed intestinal transit. Stable appearance of an L2 superior endplate compression deformity prior T12 compression deformity with post vertebroplasty changes. Biliary ductal dilatation, greater than expected for age and reservoir effect. Consider liver serologies and if elevated MRCP could be obtained. These results were called by telephone at the time of interpretation on 05/01/2019 at 5:16 am to provider Nilan Iddings [facility] , who verbally acknowledged these results. Electronically Signed   By: Lovena Le M.D.   On: 05/01/2019 05:17    Procedures Procedures   Medications Ordered in ED Medications  HYDROcodone-acetaminophen (NORCO/VICODIN) 5-325 MG per tablet 1 tablet (1 tablet Oral Given 05/01/19 0431)    ED Course  I have reviewed the triage vital signs and the nursing notes.  Pertinent labs & imaging results that were available during my care of the patient were reviewed by me and considered in my medical decision making (see chart for details).  MDM Rules/Calculators/A&P Left flank pain of uncertain cause.  No symptoms to  suggest urinary tract infection or urolithiasis.  No rash to suggest herpes zoster.  Old records are reviewed and she did have kyphoplasty done for vertebral compression fractures, but current symptoms are not suggestive of that.  Will check urinalysis to look for evidence of UTI and send for renal stone protocol CT scan.  She is given hydrocodone-acetaminophen for pain.  She had good relief of pain with above-noted treatment.  CT scan shows lytic lesions in the sacrum.  While these are not what is causing her pain, it is certainly concerning.  Radiologist thought that picture is consistent with myeloma.  On review of old records, her albumin had been borderline low and total protein at the high end of normal.  CBC and metabolic panel are obtained and show anemia with hemoglobin 8.5 which is 2.5 g lower than it was in 2017.  Also, mild leukopenia and thrombocytopenia are present.  This is certainly concerning for possible myeloma.  Metabolic panel does not show a  reversed A/G ratio but does have elevated BUN with normal creatinine.  Urinalysis is pending, and case is signed out to Dr. Wyvonnia Dusky.  She will need to be referred back to her primary care provider for further outpatient work-up for possible myeloma.  Final Clinical Impression(s) / ED Diagnoses Final diagnoses:  Left flank pain  Normochromic normocytic anemia  Thrombocytopenia (HCC)  Elevated BUN  Leukopenia, unspecified type    Rx / DC Orders ED Discharge Orders         Ordered    HYDROcodone-acetaminophen (NORCO) 5-325 MG tablet  Every 6 hours PRN     05/01/19 [id number]           Delora Fuel, MD [id number] [phone]

## 2019-05-01 NOTE — Discharge Instructions (Signed)
Take ibuprofen or naproxen as needed for pain.  Take hydrocodone-acetaminophen as needed for pain not relieved by ibuprofen or naproxen.  Your CT scan is concerning for a possible cancer - especially something called multiple myeloma. Please follow up with your primary care provider to arrange additional testing.

## 2019-05-01 NOTE — ED Triage Notes (Signed)
Pt c/o lower back pain for the last few days; pt states she has been trying to get up leaves in her yard; pt states she is unable to sleep or lie in the bed

## 2019-05-02 LAB — URINE CULTURE: Culture: NO GROWTH

## 2019-05-03 ENCOUNTER — Encounter (HOSPITAL_COMMUNITY): Payer: Self-pay | Admitting: Emergency Medicine

## 2019-05-03 ENCOUNTER — Emergency Department (HOSPITAL_COMMUNITY)
Admission: EM | Admit: 2019-05-03 | Discharge: 2019-05-03 | Disposition: A | Discharge status: Home / Self Care | Payer: Medicare Other | Attending: Emergency Medicine | Admitting: Emergency Medicine

## 2019-05-03 ENCOUNTER — Other Ambulatory Visit: Payer: Self-pay

## 2019-05-03 DIAGNOSIS — I10 Essential (primary) hypertension: Secondary | ICD-10-CM | POA: Diagnosis not present

## 2019-05-03 DIAGNOSIS — Z87891 Personal history of nicotine dependence: Secondary | ICD-10-CM | POA: Insufficient documentation

## 2019-05-03 DIAGNOSIS — R109 Unspecified abdominal pain: Secondary | ICD-10-CM | POA: Diagnosis not present

## 2019-05-03 DIAGNOSIS — Z79899 Other long term (current) drug therapy: Secondary | ICD-10-CM | POA: Insufficient documentation

## 2019-05-03 HISTORY — DX: Disorder of thyroid, unspecified: E07.9

## 2019-05-03 LAB — CBC WITH DIFFERENTIAL/PLATELET
Abs Immature Granulocytes: 0.01 10*3/uL (ref 0.00–0.07)
Basophils Absolute: 0 10*3/uL (ref 0.0–0.1)
Basophils Relative: 0 %
Eosinophils Absolute: 0.1 10*3/uL (ref 0.0–0.5)
Eosinophils Relative: 2 %
HCT: 26.4 % — ABNORMAL LOW (ref 36.0–46.0)
Hemoglobin: 8.4 g/dL — ABNORMAL LOW (ref 12.0–15.0)
Immature Granulocytes: 0 %
Lymphocytes Relative: 38 %
Lymphs Abs: 1.7 10*3/uL (ref 0.7–4.0)
MCH: 28.2 pg (ref 26.0–34.0)
MCHC: 31.8 g/dL (ref 30.0–36.0)
MCV: 88.6 fL (ref 80.0–100.0)
Monocytes Absolute: 0.5 10*3/uL (ref 0.1–1.0)
Monocytes Relative: 11 %
Neutro Abs: 2.2 10*3/uL (ref 1.7–7.7)
Neutrophils Relative %: 49 %
Platelets: 153 10*3/uL (ref 150–400)
RBC: 2.98 MIL/uL — ABNORMAL LOW (ref 3.87–5.11)
RDW: 15.2 % (ref 11.5–15.5)
WBC: 4.4 10*3/uL (ref 4.0–10.5)
nRBC: 0 % (ref 0.0–0.2)

## 2019-05-03 LAB — COMPREHENSIVE METABOLIC PANEL
ALT: 13 U/L (ref 0–44)
AST: 16 U/L (ref 15–41)
Albumin: 3.5 g/dL (ref 3.5–5.0)
Alkaline Phosphatase: 67 U/L (ref 38–126)
Anion gap: 10 (ref 5–15)
BUN: 29 mg/dL — ABNORMAL HIGH (ref 8–23)
CO2: 28 mmol/L (ref 22–32)
Calcium: 9 mg/dL (ref 8.9–10.3)
Chloride: 96 mmol/L — ABNORMAL LOW (ref 98–111)
Creatinine, Ser: 0.7 mg/dL (ref 0.44–1.00)
GFR calc Af Amer: >60 mL/min (ref >60)
GFR calc non Af Amer: >60 mL/min (ref >60)
Glucose, Bld: 100 mg/dL — ABNORMAL HIGH (ref 70–99)
Potassium: 3.5 mmol/L (ref 3.5–5.1)
Sodium: 134 mmol/L — ABNORMAL LOW (ref 135–145)
Total Bilirubin: 0.3 mg/dL (ref 0.3–1.2)
Total Protein: 6.5 g/dL (ref 6.5–8.1)

## 2019-05-03 MED ORDER — ONDANSETRON HCL 4 MG PO TABS: 4.0000 mg | ORAL_TABLET | Freq: Four times a day (QID) | ORAL | 0 refills | Status: DC

## 2019-05-03 MED ORDER — KETOROLAC TROMETHAMINE 30 MG/ML IJ SOLN
30.0000 mg | Freq: Once | INTRAMUSCULAR | Status: AC
  Administered 2019-05-03: 30 mg via INTRAVENOUS
  Filled 2019-05-03: qty 1

## 2019-05-03 MED ORDER — NAPROXEN 500 MG PO TABS: 500.0000 mg | ORAL_TABLET | Freq: Two times a day (BID) | ORAL | 0 refills | Status: DC

## 2019-05-03 NOTE — ED Provider Notes (Signed)
Medical screening examination/treatment/procedure(s) were conducted as a shared visit with non-physician practitioner(s) and myself.  I personally evaluated the patient during the encounter.      Patient seen by me along with physician assistant.  Patient was seen on the 23rd just 2 days ago.  For back pain.  Patient had CAT scan done which was suggestive of lytic lesions in the sacral area.  Concerning for may be for lymphoma.  Patient given referral to hematology oncology she has an appointment coming up to stay with hematology oncology here at [facility].  Patient was given nonnarcotic pain medicine which she says is not really helping.  We gave her a dose of Toradol here and she feels much better.  So we will just her pain control.  Labs is a bit of an anemia with a hemoglobin of 8.4.  Electrolytes without significant abnormalities.  Patient stable for discharge home additional pain control and following up with hematology oncology.   Fredia Sorrow, MD 05/03/19 2021

## 2019-05-03 NOTE — ED Triage Notes (Signed)
Patient c/o back pain and emesis, started 1 week ago. Patient states she was "seen here a couple of days ago and the medicine she was given doesn't help."

## 2019-05-03 NOTE — ED Notes (Signed)
Pt states pain is in her left flank that moves into her lower pelvic area. Pt denies urinary symptoms. PA aware.

## 2019-05-03 NOTE — ED Provider Notes (Signed)
[facility] Provider Note   CSN: [id number] Arrival date & time: 05/03/19  1512     History Chief Complaint  Patient presents with  . Back Pain    Courtney Arnold is a 79 y.o. female.  HPI     Courtney Arnold is a 79 y.o. female with hx of atrial fib, HTN and compression fx who presents to the Emergency Department here requesting pain control.  She was seen here on 05/01/19 and found to have lytic lesions of the sacrum.  She was given hydrocodone for pain control, but she states that she is unable to tolerate the medication.  She reports vomiting and increasing pain to her left lower back and flank.  She states the medication she was given is not helping control her pain.  No new or worsening pain, fever, diarrhea or abdominal pain.  No dysuria.    Past Medical History:  Diagnosis Date  . Atrial fibrillation ([place]) 2011   Postop, spontaneous conversion to normal sinus after one hour  . Compression fracture 07/24/09   T12; kyphoplasty  . History of echocardiogram 5/11   EF 65%  . Hypertension   . Thyroid disease   . Tobacco abuse     Patient Active Problem List   Diagnosis Date Noted  . Vitamin D deficiency 02/03/2016  . 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 History of colonic polyps 11/06/2015  . 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 Cholecystitis, acute 04/25/2015  . Anemia 04/25/2015  . Lower back pain   . Nausea & vomiting 04/21/2015  . History of biliary stent insertion 04/21/2015  . Volume depletion 04/21/2015  . Hypokalemia 04/21/2015  . AP (abdominal pain)   . Compression fracture of L2 lumbar vertebra (HCC)   . Uncontrollable vomiting   . Common bile duct dilation   . Cholelithiasis 04/12/2015  . Compression fracture 04/12/2015  . Abdominal pain 04/12/2015  . TOBACCO ABUSE 08/21/2009  . ATRIAL FIBRILLATION, HX OF 08/21/2009    Past Surgical History:  Procedure Laterality Date  . BACK SURGERY    . BACK  SURGERY  06/06/2015  . BREAST EXCISIONAL BIOPSY Left    50 years ago  benign  . CHOLECYSTECTOMY N/A 04/25/2015   Procedure: LAPAROSCOPIC CHOLECYSTECTOMY WITH INTRAOPERATIVE CHOLANGIOGRAM;  Surgeon: Mickeal Skinner, MD;  Location: [facility];  Service: General;  Laterality: N/A;  . COLONOSCOPY N/A 11/26/2015   Procedure: COLONOSCOPY;  Surgeon: Daneil Dolin, MD;  [facility];  Service: Endoscopy;  Laterality: N/A;  7:30 am  . ERCP N/A 04/14/2015   Procedure: ENDOSCOPIC RETROGRADE CHOLANGIOPANCREATOGRAPHY (ERCP) Biliary Sphincterotomy, 10x7 stent placement Dilated bilary system just not well seen;  Surgeon: Rogene Houston, MD;  Location: [facility];  Service: Endoscopy;  Laterality: N/A;  . ERCP N/A 06/12/2015   Procedure: ENDOSCOPIC RETROGRADE CHOLANGIOPANCREATOGRAPHY (ERCP);  Surgeon: Rogene Houston, MD;  [facility];  Service: Endoscopy;  Laterality: N/A;  . ESOPHAGOGASTRODUODENOSCOPY N/A 06/12/2015   Procedure: DIAGNOSTIC ESOPHAGOGASTRODUODENOSCOPY (EGD);  Surgeon: Rogene Houston, MD;  [facility];  Service: Endoscopy;  Laterality: N/A;  . STENT REMOVAL  06/12/2015   Procedure: STENT REMOVAL ;  Surgeon: Rogene Houston, MD;  [facility];  Service: Endoscopy;;     OB History    Gravida      Para      Term      Preterm      AB      Living  2     SAB      TAB      Ectopic      Multiple      Live Births              Family History  Problem Relation Age of Onset  . Heart attack Father   . Stroke Father   . Breast cancer Sister   . Thyroid disease Neg Hx   . Colon cancer Neg Hx     Social History   Tobacco Use  . Smoking status: Former Smoker    Packs/day: 0.15    Years: 20.00    Pack years: 3.00    Quit date: 03/08/2013    Years since quitting: 6.1  . Smokeless tobacco: Never Used  Substance Use Topics  . Alcohol use: No    Alcohol/week: 0.0 standard drinks  . Drug use: No    Home Medications Prior to Admission  medications   Medication Sig Start Date End Date Taking? Authorizing Provider  HYDROcodone-acetaminophen (NORCO) 5-325 MG tablet Take 1 tablet by mouth every 6 (six) hours as needed for moderate pain. [id number]   Delora Fuel, MD  levothyroxine (SYNTHROID) 100 MCG tablet Take 1 tablet (100 mcg total) by mouth daily. 02/19/19   Renato Shin, MD    Allergies    Patient has no known allergies.  Review of Systems   Review of Systems  Constitutional: Negative for fever.  Respiratory: Negative for shortness of breath.   Cardiovascular: Negative for chest pain and leg swelling.  Gastrointestinal: Positive for nausea and vomiting. Negative for abdominal distention, abdominal pain, constipation and diarrhea.  Genitourinary: Positive for flank pain. Negative for decreased urine volume, difficulty urinating, dysuria and hematuria.  Musculoskeletal: Positive for back pain. Negative for joint swelling and neck pain.  Skin: Negative for color change and rash.  Neurological: Negative for weakness and numbness.    Physical Exam Updated Vital Signs BP (!) 150/56 (BP Location: Right Arm)   Pulse 78   Temp 97.9 F (36.6 C) (Oral)   Resp 18   Ht 5\' 2"  (1.575 m)   Wt 54.4 kg   SpO2 96%   BMI 21.95 kg/m   Physical Exam Vitals and nursing note reviewed.  Constitutional:      General: She is not in acute distress.    Appearance: Normal appearance. She is not toxic-appearing.  HENT:     Mouth/Throat:     Mouth: Mucous membranes are moist.  Cardiovascular:     Rate and Rhythm: Normal rate and regular rhythm.     Pulses: Normal pulses.  Pulmonary:     Effort: Pulmonary effort is normal.     Breath sounds: Normal breath sounds.  Chest:     Chest wall: No tenderness.  Abdominal:     Palpations: Abdomen is soft.     Tenderness: There is no abdominal tenderness. There is no right CVA tenderness, left CVA tenderness or guarding.  Musculoskeletal:        General: Tenderness present.     Cervical  back: Normal range of motion.     Right lower leg: No edema.     Left lower leg: No edema.     Comments: ttp of the left lower lumbar paraspinal muscles.  Neg SLR bilaterally  Skin:    General: Skin is warm.     Findings: No erythema or rash.  Neurological:     General: No focal deficit present.     Mental  Status: She is alert.     Sensory: No sensory deficit.     Motor: No weakness.     ED Results / Procedures / Treatments   Labs (all labs ordered are listed, but only abnormal results are displayed) Labs Reviewed  CBC WITH DIFFERENTIAL/PLATELET - Abnormal; Notable for the following components:      Result Value   RBC 2.98 (*)    Hemoglobin 8.4 (*)    HCT 26.4 (*)    All other components within normal limits  COMPREHENSIVE METABOLIC PANEL - Abnormal; Notable for the following components:   Sodium 134 (*)    Chloride 96 (*)    Glucose, Bld 100 (*)    BUN 29 (*)    All other components within normal limits    EKG None  Radiology No results found.  Procedures Procedures (including critical care time)  Medications Ordered in ED Medications  ketorolac (TORADOL) 30 MG/ML injection 30 mg (30 mg Intravenous Given 05/03/19 1926)    ED Course  I have reviewed the triage vital signs and the nursing notes.  Pertinent labs & imaging results that were available during my care of the patient were reviewed by me and considered in my medical decision making (see chart for details).    MDM Rules/Calculators/A&P                      Pt here for pain control.  She was seen here 2 days ago for left flank pain and CT of abdomen and pelvis and found to have some lytic lesions of the pelvis concerning for possible myeloma.  Patient has a scheduled appointment with oncology, Dr. Delton Coombes for Tuesday, March 2.  She was unable to tolerate the narcotic pain medication prescribed on her previous visit, but did get significant pain relief with Toradol here.  I will provide prescription for  short course of naproxen, creatinine wnml  I have spoken with patient's daughter, Vivien Rota with patient's permission and advised her of findings.  Pt also seen by Dr. Rogene Houston   Final Clinical Impression(s) / ED Diagnoses Final diagnoses:  Flank pain    Rx / DC Orders ED Discharge Orders    None       Kem Parkinson, PA-C 05/04/19 2246    Fredia Sorrow, MD 05/08/19 [phone]

## 2019-05-03 NOTE — Discharge Instructions (Addendum)
Do not take the hydrocodone that you are prescribed on your previous visit.  You may start the new medication and take as directed.  Do not take the medication on an empty stomach.  Be sure to keep your appointment with Dr. Delton Coombes on Tuesday.

## 2019-05-08 ENCOUNTER — Inpatient Hospital Stay (HOSPITAL_COMMUNITY): Payer: Medicare Other

## 2019-05-08 ENCOUNTER — Encounter (HOSPITAL_COMMUNITY): Payer: Self-pay | Admitting: Hematology

## 2019-05-08 ENCOUNTER — Inpatient Hospital Stay (HOSPITAL_COMMUNITY): Payer: Medicare Other | Attending: Hematology | Admitting: Hematology

## 2019-05-08 ENCOUNTER — Other Ambulatory Visit: Payer: Self-pay

## 2019-05-08 DIAGNOSIS — D62 Acute posthemorrhagic anemia: Secondary | ICD-10-CM | POA: Diagnosis not present

## 2019-05-08 DIAGNOSIS — Z7901 Long term (current) use of anticoagulants: Secondary | ICD-10-CM | POA: Insufficient documentation

## 2019-05-08 DIAGNOSIS — S299XXA Unspecified injury of thorax, initial encounter: Secondary | ICD-10-CM | POA: Diagnosis not present

## 2019-05-08 DIAGNOSIS — I10 Essential (primary) hypertension: Secondary | ICD-10-CM

## 2019-05-08 DIAGNOSIS — M25551 Pain in right hip: Secondary | ICD-10-CM | POA: Diagnosis not present

## 2019-05-08 DIAGNOSIS — Z79899 Other long term (current) drug therapy: Secondary | ICD-10-CM | POA: Insufficient documentation

## 2019-05-08 DIAGNOSIS — Z87891 Personal history of nicotine dependence: Secondary | ICD-10-CM | POA: Diagnosis not present

## 2019-05-08 DIAGNOSIS — M545 Low back pain: Secondary | ICD-10-CM | POA: Diagnosis not present

## 2019-05-08 DIAGNOSIS — R000 Tachycardia, unspecified: Secondary | ICD-10-CM | POA: Diagnosis not present

## 2019-05-08 DIAGNOSIS — J9601 Acute respiratory failure with hypoxia: Secondary | ICD-10-CM | POA: Diagnosis not present

## 2019-05-08 DIAGNOSIS — M899 Disorder of bone, unspecified: Secondary | ICD-10-CM | POA: Insufficient documentation

## 2019-05-08 DIAGNOSIS — Z803 Family history of malignant neoplasm of breast: Secondary | ICD-10-CM | POA: Diagnosis not present

## 2019-05-08 DIAGNOSIS — S72341A Displaced spiral fracture of shaft of right femur, initial encounter for closed fracture: Secondary | ICD-10-CM | POA: Diagnosis not present

## 2019-05-08 DIAGNOSIS — E538 Deficiency of other specified B group vitamins: Secondary | ICD-10-CM | POA: Insufficient documentation

## 2019-05-08 DIAGNOSIS — D649 Anemia, unspecified: Secondary | ICD-10-CM | POA: Insufficient documentation

## 2019-05-08 DIAGNOSIS — Z20822 Contact with and (suspected) exposure to covid-19: Secondary | ICD-10-CM | POA: Diagnosis not present

## 2019-05-08 DIAGNOSIS — C9030 Solitary plasmacytoma not having achieved remission: Secondary | ICD-10-CM | POA: Diagnosis not present

## 2019-05-08 DIAGNOSIS — S79911A Unspecified injury of right hip, initial encounter: Secondary | ICD-10-CM | POA: Diagnosis not present

## 2019-05-08 DIAGNOSIS — M4854XA Collapsed vertebra, not elsewhere classified, thoracic region, initial encounter for fracture: Secondary | ICD-10-CM | POA: Diagnosis not present

## 2019-05-08 DIAGNOSIS — I4891 Unspecified atrial fibrillation: Secondary | ICD-10-CM | POA: Diagnosis not present

## 2019-05-08 DIAGNOSIS — S72401A Unspecified fracture of lower end of right femur, initial encounter for closed fracture: Secondary | ICD-10-CM | POA: Diagnosis not present

## 2019-05-08 DIAGNOSIS — C9000 Multiple myeloma not having achieved remission: Secondary | ICD-10-CM | POA: Insufficient documentation

## 2019-05-08 LAB — IRON AND TIBC
Iron: 31 ug/dL (ref 28–170)
Saturation Ratios: 9 % — ABNORMAL LOW (ref 10.4–31.8)
TIBC: 349 ug/dL (ref 250–450)
UIBC: 318 ug/dL

## 2019-05-08 LAB — COMPREHENSIVE METABOLIC PANEL
ALT: 16 U/L (ref 0–44)
AST: 16 U/L (ref 15–41)
Albumin: 3.6 g/dL (ref 3.5–5.0)
Alkaline Phosphatase: 83 U/L (ref 38–126)
Anion gap: 8 (ref 5–15)
BUN: 27 mg/dL — ABNORMAL HIGH (ref 8–23)
CO2: 29 mmol/L (ref 22–32)
Calcium: 8.8 mg/dL — ABNORMAL LOW (ref 8.9–10.3)
Chloride: 98 mmol/L (ref 98–111)
Creatinine, Ser: 0.74 mg/dL (ref 0.44–1.00)
GFR calc Af Amer: >60 mL/min (ref >60)
GFR calc non Af Amer: >60 mL/min (ref >60)
Glucose, Bld: 90 mg/dL (ref 70–99)
Potassium: 3.9 mmol/L (ref 3.5–5.1)
Sodium: 135 mmol/L (ref 135–145)
Total Bilirubin: 0.5 mg/dL (ref 0.3–1.2)
Total Protein: 6.7 g/dL (ref 6.5–8.1)

## 2019-05-08 LAB — CBC WITH DIFFERENTIAL/PLATELET
Abs Immature Granulocytes: 0.03 10*3/uL (ref 0.00–0.07)
Basophils Absolute: 0 10*3/uL (ref 0.0–0.1)
Basophils Relative: 0 %
Eosinophils Absolute: 0.1 10*3/uL (ref 0.0–0.5)
Eosinophils Relative: 3 %
HCT: 27.8 % — ABNORMAL LOW (ref 36.0–46.0)
Hemoglobin: 8.5 g/dL — ABNORMAL LOW (ref 12.0–15.0)
Immature Granulocytes: 1 %
Lymphocytes Relative: 32 %
Lymphs Abs: 1.5 10*3/uL (ref 0.7–4.0)
MCH: 27.8 pg (ref 26.0–34.0)
MCHC: 30.6 g/dL (ref 30.0–36.0)
MCV: 90.8 fL (ref 80.0–100.0)
Monocytes Absolute: 0.4 10*3/uL (ref 0.1–1.0)
Monocytes Relative: 8 %
Neutro Abs: 2.7 10*3/uL (ref 1.7–7.7)
Neutrophils Relative %: 56 %
Platelets: 184 10*3/uL (ref 150–400)
RBC: 3.06 MIL/uL — ABNORMAL LOW (ref 3.87–5.11)
RDW: 15.1 % (ref 11.5–15.5)
WBC: 4.8 10*3/uL (ref 4.0–10.5)
nRBC: 0 % (ref 0.0–0.2)

## 2019-05-08 LAB — LACTATE DEHYDROGENASE: LDH: 139 U/L (ref 98–192)

## 2019-05-08 LAB — FOLATE: Folate: 12.7 ng/mL (ref >5.9)

## 2019-05-08 LAB — VITAMIN B12: Vitamin B-12: 118 pg/mL — ABNORMAL LOW (ref 180–914)

## 2019-05-08 LAB — RETICULOCYTES
Immature Retic Fract: 18 % — ABNORMAL HIGH (ref 2.3–15.9)
RBC.: 3.03 MIL/uL — ABNORMAL LOW (ref 3.87–5.11)
Retic Count, Absolute: 43.6 10*3/uL (ref 19.0–186.0)
Retic Ct Pct: 1.4 % (ref 0.4–3.1)

## 2019-05-08 LAB — FERRITIN: Ferritin: 64 ng/mL (ref 11–307)

## 2019-05-08 MED ORDER — LIDOCAINE 5 % EX PTCH: 1.0000 | MEDICATED_PATCH | CUTANEOUS | 3 refills | Status: DC

## 2019-05-08 MED ORDER — OXYCODONE HCL 10 MG PO TABS: 10.0000 mg | ORAL_TABLET | Freq: Four times a day (QID) | ORAL | 0 refills | Status: DC | PRN

## 2019-05-08 NOTE — Assessment & Plan Note (Addendum)
1.  Sacral lytic lesions: -Patient evaluated in the ER on 05/01/2019 for 2 to 3-week history of left-sided low back pain. -CT renal study showed expansile lytic lesions present within the sacrum including a 4.2 cm lesion in the right sacral ala which extends into the right SI joint and a smaller infiltrating lytic lesion in the left sacral ala measuring 2.4 cm and a separate focus more posteriorly measuring 1.9 cm.  No adenopathy was seen.  Biliary ductal dilation, greater than expected for age. -Labs on that day showed normal calcium and creatinine. -Patient received radioiodine 131 treatment in June 2017 for hyperthyroidism after she failed medical therapy with methimazole. -Patient smoked 2 to 3 cigarettes/day for 50 years and quit in 2015. -Last colonoscopy was on 11/26/2015 which showed colonic diverticulosis with no suspicious lesions.  Last mammogram was on 11/30/2018 which was BI-RADS Category 1. -I have recommended doing a PET CT scan as soon as possible to identify the primary and area of biopsy. -I will see her back after the PET CT scan to discuss results and further plan.  We will send myeloma work-up and LDH today.  2.  Normocytic anemia: -CBC on 05/03/2019 showed hemoglobin 8.4 with MCV 88.6.  White count and platelet count was normal. -We will do further work-up including ferritin, iron panel, [id number], folic acid, methylmalonic acid and copper levels.  Stool for occult blood was negative in the ER on 05/01/2019.  3.  Low back pain: -She is currently taking Naprosyn and has taken quite a bit in the last few days. -She is also taking hydrocodone 5/325.  But the pain control only lasts about 1 hour. -Sitting down makes the pain worse and walking helps. -I will switch her to oxycodone 10 mg every 6 hours.  We will also start her on Lidoderm patch to be applied 12 hours on, 12 hours off.  4.  Family history: -Mother had breast cancer.  Sister had breast cancer.  Brother had cancer in the  back.

## 2019-05-08 NOTE — Progress Notes (Signed)
[facility] NOTE  Patient Care Team: [name], MD as PCP - General (Family Medicine) Gala Romney Cristopher Estimable, MD as Consulting Physician (Gastroenterology)  CHIEF COMPLAINTS/PURPOSE OF CONSULTATION:  Lytic lesions in the sacrum.  HISTORY OF PRESENTING ILLNESS:  Courtney Arnold 79 y.o. female is seen in consultation today for further work-up and management of lytic lesions of the sacrum.  Patient presented to the ER with 2 to 3-week history of low back pain.  This is predominantly on the left side and sometimes extends to the left line.  She denies any weight loss.  She reports the pain is sharp in nature.  Sitting down makes the pain worse.  Walking helps with the pain.  She was evaluated in the ER and a CT renal study on 05/01/2019 showed expansile lytic lesions present within the sacrum including 4.2 cm lesion in the right sacral ala which extends into the right SI joint and a smaller infiltrating lytic lesion in the left sacral ala measuring 2.4 cm and a separate focus more posteriorly measuring 1.9 cm.  No adenopathy was seen.  She denies any loss of bowel and bladder control.  She smoked 2 to 3 cigarettes/day for 50 years and quit in 2015.  She worked at [employer] and a [employer].  She lives at home by herself and is independent of all activities of daily living.  She is accompanied by her daughter today.  Her appetite has been reasonably good at 75%.  Energy levels are also 75%.  She is currently taking hydrocodone 5/325 mg and the pain improves for 1 hour after taking the tablet.  She is also taking Naprosyn and ibuprofen a lot.  She also has Flexeril 5 mg 3 times daily which helps with pain at times.  Denies any bleeding per rectum or melena.  Family history significant for mother and sister with breast cancers.  Brother had cancer in the back.  MEDICAL HISTORY:  Past Medical History:  Diagnosis Date  . Atrial fibrillation ([place]) 2011   Postop,  spontaneous conversion to normal sinus after one hour  . Compression fracture 07/24/09   T12; kyphoplasty  . History of echocardiogram 5/11   EF 65%  . Hypertension   . Thyroid disease   . Tobacco abuse     SURGICAL HISTORY: Past Surgical History:  Procedure Laterality Date  . BACK SURGERY    . BACK SURGERY  06/06/2015  . BREAST EXCISIONAL BIOPSY Left    50 years ago  benign  . CHOLECYSTECTOMY N/A 04/25/2015   Procedure: LAPAROSCOPIC CHOLECYSTECTOMY WITH INTRAOPERATIVE CHOLANGIOGRAM;  Surgeon: Mickeal Skinner, MD;  Location: [facility];  Service: General;  Laterality: N/A;  . COLONOSCOPY N/A 11/26/2015   Procedure: COLONOSCOPY;  Surgeon: Daneil Dolin, MD;  Location: [facility];  Service: Endoscopy;  Laterality: N/A;  7:30 am  . ERCP N/A 04/14/2015   Procedure: ENDOSCOPIC RETROGRADE CHOLANGIOPANCREATOGRAPHY (ERCP) Biliary Sphincterotomy, 10x7 stent placement Dilated bilary system just not well seen;  Surgeon: Rogene Houston, MD;  Location: [facility];  Service: Endoscopy;  Laterality: N/A;  . ERCP N/A 06/12/2015   Procedure: ENDOSCOPIC RETROGRADE CHOLANGIOPANCREATOGRAPHY (ERCP);  Surgeon: Rogene Houston, MD;  Location[facility];  Service: Endoscopy;  Laterality: N/A;  . ESOPHAGOGASTRODUODENOSCOPY N/A 06/12/2015   Procedure: DIAGNOSTIC ESOPHAGOGASTRODUODENOSCOPY (EGD);  Surgeon: Rogene Houston, MD;  Location[facility];  Service: Endoscopy;  Laterality: N/A;  . STENT REMOVAL  06/12/2015   Procedure: STENT REMOVAL ;  Surgeon: Rogene Houston, MD;  Location[facility];  Service: Endoscopy;;    SOCIAL HISTORY: Social History   Socioeconomic History  . Marital status: Widowed    Spouse name: Not on file  . Number of children: 2  . Years of education: Not on file  . Highest education level: Not on file  Occupational History  . Not on file  Tobacco Use  . Smoking status: Former Smoker    Packs/day: 0.15    Years: 20.00    Pack years: 3.00    Quit date: 03/08/2013    Years  since quitting: 6.1  . Smokeless tobacco: Never Used  Substance and Sexual Activity  . Alcohol use: No    Alcohol/week: 0.0 standard drinks  . Drug use: No  . Sexual activity: Not Currently  Other Topics Concern  . Not on file  Social History Narrative   Active in gardens and does yard work.   Social Determinants of Health   Financial Resource Strain: Low Risk   . Difficulty of Paying Living Expenses: Not hard at all  Food Insecurity: No Food Insecurity  . Worried About Charity fundraiser in the Last Year: Never true  . Ran Out of Food in the Last Year: Never true  Transportation Needs: No Transportation Needs  . Lack of Transportation (Medical): No  . Lack of Transportation (Non-Medical): No  Physical Activity: Inactive  . Days of Exercise per Week: 0 days  . Minutes of Exercise per Session: 0 min  Stress: No Stress Concern Present  . Feeling of Stress : Not at all  Social Connections:   . Frequency of Communication with Friends and Family: Not on file  . Frequency of Social Gatherings with Friends and Family: Not on file  . Attends Religious Services: Not on file  . Active Member of Clubs or Organizations: Not on file  . Attends Archivist Meetings: Not on file  . Marital Status: Not on file  Intimate Partner Violence: Not At Risk  . Fear of Current or Ex-Partner: No  . Emotionally Abused: No  . Physically Abused: No  . Sexually Abused: No    FAMILY HISTORY: Family History  Problem Relation Age of Onset  . Heart attack Father   . Stroke Father   . Breast cancer Sister   . Thyroid disease Neg Hx   . Colon cancer Neg Hx     ALLERGIES:  has No Known Allergies.  MEDICATIONS:  Current Outpatient Medications  Medication Sig Dispense Refill  . ALPRAZolam (XANAX) 0.5 MG tablet Take 0.5 mg by mouth 3 (three) times daily.    . cyclobenzaprine (FLEXERIL) 5 MG tablet Take 5 mg by mouth 3 (three) times daily.    Marland Kitchen levothyroxine (SYNTHROID) 100 MCG tablet Take  1 tablet (100 mcg total) by mouth daily. 90 tablet 3  . HYDROcodone-acetaminophen (NORCO) 5-325 MG tablet Take 1 tablet by mouth every 6 (six) hours as needed for moderate pain. (Patient not taking: Reported on 05/08/2019) 15 tablet 0  . lidocaine (LIDODERM) 5 % Place 1 patch onto the skin daily. Remove & Discard patch within 12 hours or as directed by MD 10 patch 3  . naproxen (NAPROSYN) 500 MG tablet Take 1 tablet (500 mg total) by mouth 2 (two) times daily with a meal. (Patient not taking: Reported on 05/08/2019) 10 tablet 0  . ondansetron (ZOFRAN) 4 MG tablet Take 1 tablet (4 mg total) by mouth every 6 (six) hours. As  needed for nausea or vomiting (Patient not taking: Reported on 05/08/2019) 10 tablet 0  . Oxycodone HCl 10 MG TABS Take 1 tablet (10 mg total) by mouth every 6 (six) hours as needed. 60 tablet 0   No current facility-administered medications for this visit.    REVIEW OF SYSTEMS:   Constitutional: Denies fevers, chills or abnormal night sweats Eyes: Denies blurriness of vision, double vision or watery eyes Ears, nose, mouth, throat, and face: Denies mucositis or sore throat Respiratory: Denies cough, dyspnea or wheezes Cardiovascular: Denies palpitation, chest discomfort or lower extremity swelling Gastrointestinal:  Denies nausea, heartburn or change in bowel habits Skin: Denies abnormal skin rashes Lymphatics: Denies new lymphadenopathy or easy bruising Neurological:Denies numbness, tingling or new weaknesses Behavioral/Psych: Mood is stable, no new changes.  Positive for sharp pain in the left line region and left lower back. All other systems were reviewed with the patient and are negative.  PHYSICAL EXAMINATION: ECOG PERFORMANCE STATUS: 1 - Symptomatic but completely ambulatory  There were no vitals filed for this visit. There were no vitals filed for this visit.  GENERAL:alert, no distress and comfortable SKIN: skin color, texture, turgor are normal, no rashes or  significant lesions EYES: normal, conjunctiva are pink and non-injected, sclera clear OROPHARYNX:no exudate, no erythema and lips, buccal mucosa, and tongue normal  NECK: supple, thyroid normal size, non-tender, without nodularity LYMPH:  no palpable lymphadenopathy in the cervical, axillary or inguinal LUNGS: clear to auscultation and percussion with normal breathing effort HEART: regular rate & rhythm and no murmurs and no lower extremity edema ABDOMEN:abdomen soft, non-tender and normal bowel sounds Musculoskeletal:no cyanosis of digits and no clubbing  PSYCH: alert & oriented x 3 with fluent speech NEURO: no focal motor/sensory deficits  LABORATORY DATA:  I have reviewed the data as listed Lab Results  Component Value Date   WBC 4.8 05/08/2019   HGB 8.5 (L) 05/08/2019   HCT 27.8 (L) 05/08/2019   MCV 90.8 05/08/2019   PLT 184 05/08/2019     Chemistry      Component Value Date/Time   NA 135 05/08/2019 1421   K 3.9 05/08/2019 1421   CL 98 05/08/2019 1421   CO2 29 05/08/2019 1421   BUN 27 (H) 05/08/2019 1421   CREATININE 0.74 05/08/2019 1421      Component Value Date/Time   CALCIUM 8.8 (L) 05/08/2019 1421   ALKPHOS 83 05/08/2019 1421   AST 16 05/08/2019 1421   ALT 16 05/08/2019 1421   BILITOT 0.5 05/08/2019 1421       RADIOGRAPHIC STUDIES: I have personally reviewed the radiological images as listed and agreed with the findings in the report. CT Renal Stone Study  Addendum Date: 05/01/2019   ADDENDUM REPORT: 05/01/2019 22:05 ADDENDUM: Following addendum to the findings above, originally discussed with ordering provider Dr. Roxanne Mins on [id number] at 5:16 a.m. Musculoskeletal: Expansile lytic lesions are present within the sacrum including a 4.2 cm lesion in the right sacral ala which extends into the right SI joint and a smaller infiltrating lytic focus in the left sacral ala measuring up to 2.4 cm (5/63) and a separate focus more posteriorly measuring 1.9 cm (5/69,  heterogeneous marrow elsewhere in the sacrum is conspicuous as well and worrisome for additional site of osseous involvement. Electronically Signed   By: Lovena Le M.D.   On: 05/01/2019 22:05   Result Date: 05/01/2019 CLINICAL DATA:  Lower back pain for several days EXAM: CT ABDOMEN AND PELVIS WITHOUT CONTRAST TECHNIQUE: Multidetector CT  imaging of the abdomen and pelvis was performed following the standard protocol without IV contrast. COMPARISON:  Lower back pain, unable to sleep FINDINGS: Lower chest: Some bandlike areas of atelectasis or scarring present in the lower chest. No consolidation or effusion. Normal heart size. No pericardial effusion. Hepatobiliary: Punctate calcification present in the anterior left lobe liver, stable from prior. No focal concerning liver lesion. Smooth hepatic surface contour. Extensive intra and extrahepatic biliary ductal dilatation, greater than expected for age or reservoir effect. No visible intraductal gallstones. No residual pneumatosis. Pancreas: Unremarkable. No pancreatic ductal dilatation or surrounding inflammatory changes. Spleen: Normal in size without focal abnormality. Adrenals/Urinary Tract: Nodular thickening of the adrenal glands without discrete adrenal lesion, similar to prior. Enlarging simple attenuation cyst in the anterior interpolar left kidney measuring 4.2 cm in size and 5 HU attenuation. No worrisome features. Additional 1.1 cm subcapsular cyst in the right kidney measuring fluid attenuation. No visible or contour deforming worrisome renal lesions. No urolithiasis or hydronephrosis. Urinary bladder is unremarkable. Stomach/Bowel: Fluid-filled hiatal hernia. Distal stomach is unremarkable. Duodenal sweep takes a normal course across the abdomen. No small bowel dilatation or wall thickening. Mild distal small bowel fecalization. A normal appendix is visualized. Moderate stool burden in the colon. No colonic dilatation or wall thickening. Scattered  colonic diverticula without focal pericolonic inflammation to suggest diverticulitis. Vascular/Lymphatic: Atherosclerotic plaque within the normal caliber aorta. No suspicious or enlarged lymph nodes in the included lymphatic chains. Reproductive: Normal appearance of the uterus and adnexal structures. Other: No free fluid.  No free air.  No bowel containing hernias. Musculoskeletal: Unchanged appearance of an L2 superior endplate compression deformity with approximately 40% height loss superiorly. Prior T12 compression deformity with vertebroplasty changes is also stable. No new fracture or compression deformity is seen. No left though hilar IMPRESSION: Expansile lytic lesions within the sacrum with some endosteal scalloping. Appearance is concerning for possible myeloma versus metastatic disease. Correlation with patient history and serologies is recommended. No obstructive urolithiasis or hydronephrosis. Fluid-filled hiatal hernia, correlate for symptoms of reflux. Small bowel fecalization, correlate for slowed intestinal transit. Stable appearance of an L2 superior endplate compression deformity prior T12 compression deformity with post vertebroplasty changes. Biliary ductal dilatation, greater than expected for age and reservoir effect. Consider liver serologies and if elevated MRCP could be obtained. These results were called by telephone at the time of interpretation on 05/01/2019 at 5:16 am to provider DAVID [facility] , who verbally acknowledged these results. Electronically Signed: By: Lovena Le M.D. On: 05/01/2019 05:17    ASSESSMENT & PLAN:  Lesion of pelvic bone 1.  Sacral lytic lesions: -Patient evaluated in the ER on 05/01/2019 for 2 to 3-week history of left-sided low back pain. -CT renal study showed expansile lytic lesions present within the sacrum including a 4.2 cm lesion in the right sacral ala which extends into the right SI joint and a smaller infiltrating lytic lesion in the left sacral  ala measuring 2.4 cm and a separate focus more posteriorly measuring 1.9 cm.  No adenopathy was seen.  Biliary ductal dilation, greater than expected for age. -Labs on that day showed normal calcium and creatinine. -Patient received radioiodine 131 treatment in June 2017 for hyperthyroidism after she failed medical therapy with methimazole. -Patient smoked 2 to 3 cigarettes/day for 50 years and quit in 2015. -Last colonoscopy was on 11/26/2015 which showed colonic diverticulosis with no suspicious lesions.  Last mammogram was on 11/30/2018 which was BI-RADS Category 1. -I have recommended doing a  PET CT scan as soon as possible to identify the primary and area of biopsy. -I will see her back after the PET CT scan to discuss results and further plan.  We will send myeloma work-up and LDH today.  2.  Normocytic anemia: -CBC on 05/03/2019 showed hemoglobin 8.4 with MCV 88.6.  White count and platelet count was normal. -We will do further work-up including ferritin, iron panel, [id number], folic acid, methylmalonic acid and copper levels.  Stool for occult blood was negative in the ER on 05/01/2019.  3.  Low back pain: -She is currently taking Naprosyn and has taken quite a bit in the last few days. -She is also taking hydrocodone 5/325.  But the pain control only lasts about 1 hour. -Sitting down makes the pain worse and walking helps. -I will switch her to oxycodone 10 mg every 6 hours.  We will also start her on Lidoderm patch to be applied 12 hours on, 12 hours off.  4.  Family history: -Mother had breast cancer.  Sister had breast cancer.  Brother had cancer in the back.  Orders Placed This Encounter  Procedures  . NM PET Image Initial (PI) Skull Base To Thigh    Standing Status:   Future    Standing Expiration Date:   05/07/2020    Order Specific Question:   ** REASON FOR EXAM (FREE TEXT)    Answer:   sacral lytic lesion    Order Specific Question:   If indicated for the ordered procedure, I  authorize the administration of a radiopharmaceutical per Radiology protocol    Answer:   Yes    Order Specific Question:   Preferred imaging location?    Answer:   [facility]    Order Specific Question:   Radiology Contrast Protocol - do NOT remove file path    Answer:   [email or web address]  . CBC with Differential/Platelet    Standing Status:   Future    Number of Occurrences:   1    Standing Expiration Date:   05/07/2020  . Comprehensive metabolic panel    Standing Status:   Future    Number of Occurrences:   1    Standing Expiration Date:   05/07/2020  . Kappa/lambda light chains    Standing Status:   Future    Number of Occurrences:   1    Standing Expiration Date:   05/07/2020  . Iron and TIBC    Standing Status:   Future    Number of Occurrences:   1    Standing Expiration Date:   05/07/2020  . Ferritin    Standing Status:   Future    Number of Occurrences:   1    Standing Expiration Date:   05/07/2020  . Vitamin B12    Standing Status:   Future    Number of Occurrences:   1    Standing Expiration Date:   05/07/2020  . Folate    Standing Status:   Future    Number of Occurrences:   1    Standing Expiration Date:   05/07/2020  . Methylmalonic acid, serum    Standing Status:   Future    Number of Occurrences:   1    Standing Expiration Date:   05/07/2020  . Protein electrophoresis, serum    Standing Status:   Future    Number of Occurrences:   1    Standing Expiration Date:   05/07/2020  . Copper, serum  Standing Status:   Future    Number of Occurrences:   1    Standing Expiration Date:   05/07/2020  . Reticulocytes    Standing Status:   Future    Number of Occurrences:   1    Standing Expiration Date:   05/07/2020  . Lactate dehydrogenase    Standing Status:   Future    Number of Occurrences:   1    Standing Expiration Date:   05/07/2020  . Immunofixation electrophoresis    Standing Status:   Future    Number of Occurrences:   1    Standing  Expiration Date:   05/07/2020    All questions were answered. The patient knows to call the clinic with any problems, questions or concerns.      Derek Jack, MD 05/08/2019 5:22 PM

## 2019-05-08 NOTE — Patient Instructions (Addendum)
[place] at [facility] Discharge Instructions  You were seen today by Dr. Delton Coombes. He went over your history, family history and how you've been since your recent ER visit. He will schedule you for a PET scan to evaluate the lesion on your bones. You will have blood drawn today before you leave. He will send in a new prescription for pain medication, only take this medication every 6 hours. He will also send in a lidocaine patch to help with the pain. He will see you back after your scan for follow up.   Thank you for choosing [place] at [facility] to provide your oncology and hematology care.  To afford each patient quality time with our provider, please arrive at least 15 minutes before your scheduled appointment time.   If you have a lab appointment with the [facility] please come in thru the  Main Entrance and check in at the main information desk  You need to re-schedule your appointment should you arrive 10 or more minutes late.  We strive to give you quality time with our providers, and arriving late affects you and other patients whose appointments are after yours.  Also, if you no show three or more times for appointments you may be dismissed from the clinic at the providers discretion.     Again, thank you for choosing [facility].  Our hope is that these requests will decrease the amount of time that you wait before being seen by our physicians.       _____________________________________________________________  Should you have questions after your visit to [facility], please contact our office at [phone] between the hours of 8:00 a.m. and 4:30 p.m.  Voicemails left after 4:00 p.m. will not be returned until the following business day.  For prescription refill requests, have your pharmacy contact our office and allow 72 hours.    Cancer Center Support Programs:   > Cancer Support Group  2nd  Tuesday of the month 1pm-2pm, Journey Room

## 2019-05-09 LAB — KAPPA/LAMBDA LIGHT CHAINS
Kappa free light chain: 42.8 mg/L — ABNORMAL HIGH (ref 3.3–19.4)
Kappa, lambda light chain ratio: 4.32 — ABNORMAL HIGH (ref 0.26–1.65)
Lambda free light chains: 9.9 mg/L (ref 5.7–26.3)

## 2019-05-10 ENCOUNTER — Inpatient Hospital Stay (HOSPITAL_COMMUNITY): Payer: Medicare Other

## 2019-05-10 ENCOUNTER — Emergency Department (HOSPITAL_COMMUNITY): Payer: Medicare Other

## 2019-05-10 ENCOUNTER — Other Ambulatory Visit: Payer: Self-pay

## 2019-05-10 ENCOUNTER — Inpatient Hospital Stay (HOSPITAL_COMMUNITY)
Admission: EM | Admit: 2019-05-10 | Discharge: 2019-05-21 | DRG: 477 | Disposition: A | Discharge status: Skilled Nursing Facility | Payer: Medicare Other | Attending: Student | Admitting: Student

## 2019-05-10 DIAGNOSIS — S72331D Displaced oblique fracture of shaft of right femur, subsequent encounter for closed fracture with routine healing: Secondary | ICD-10-CM | POA: Diagnosis not present

## 2019-05-10 DIAGNOSIS — Z20822 Contact with and (suspected) exposure to covid-19: Secondary | ICD-10-CM | POA: Diagnosis present

## 2019-05-10 DIAGNOSIS — M25551 Pain in right hip: Secondary | ICD-10-CM | POA: Diagnosis not present

## 2019-05-10 DIAGNOSIS — D649 Anemia, unspecified: Secondary | ICD-10-CM | POA: Diagnosis present

## 2019-05-10 DIAGNOSIS — Z741 Need for assistance with personal care: Secondary | ICD-10-CM | POA: Diagnosis not present

## 2019-05-10 DIAGNOSIS — E039 Hypothyroidism, unspecified: Secondary | ICD-10-CM | POA: Diagnosis present

## 2019-05-10 DIAGNOSIS — W19XXXA Unspecified fall, initial encounter: Secondary | ICD-10-CM

## 2019-05-10 DIAGNOSIS — M898X8 Other specified disorders of bone, other site: Secondary | ICD-10-CM | POA: Diagnosis not present

## 2019-05-10 DIAGNOSIS — S32040A Wedge compression fracture of fourth lumbar vertebra, initial encounter for closed fracture: Secondary | ICD-10-CM | POA: Diagnosis not present

## 2019-05-10 DIAGNOSIS — D62 Acute posthemorrhagic anemia: Secondary | ICD-10-CM | POA: Diagnosis not present

## 2019-05-10 DIAGNOSIS — F419 Anxiety disorder, unspecified: Secondary | ICD-10-CM | POA: Diagnosis present

## 2019-05-10 DIAGNOSIS — Y92002 Bathroom of unspecified non-institutional (private) residence single-family (private) house as the place of occurrence of the external cause: Secondary | ICD-10-CM | POA: Diagnosis not present

## 2019-05-10 DIAGNOSIS — E538 Deficiency of other specified B group vitamins: Secondary | ICD-10-CM | POA: Diagnosis present

## 2019-05-10 DIAGNOSIS — Z7401 Bed confinement status: Secondary | ICD-10-CM | POA: Diagnosis not present

## 2019-05-10 DIAGNOSIS — J9601 Acute respiratory failure with hypoxia: Secondary | ICD-10-CM | POA: Diagnosis not present

## 2019-05-10 DIAGNOSIS — F17200 Nicotine dependence, unspecified, uncomplicated: Secondary | ICD-10-CM

## 2019-05-10 DIAGNOSIS — Z87891 Personal history of nicotine dependence: Secondary | ICD-10-CM | POA: Diagnosis not present

## 2019-05-10 DIAGNOSIS — R948 Abnormal results of function studies of other organs and systems: Secondary | ICD-10-CM | POA: Diagnosis not present

## 2019-05-10 DIAGNOSIS — Z79899 Other long term (current) drug therapy: Secondary | ICD-10-CM

## 2019-05-10 DIAGNOSIS — T40605A Adverse effect of unspecified narcotics, initial encounter: Secondary | ICD-10-CM | POA: Diagnosis not present

## 2019-05-10 DIAGNOSIS — D638 Anemia in other chronic diseases classified elsewhere: Secondary | ICD-10-CM | POA: Diagnosis present

## 2019-05-10 DIAGNOSIS — M4854XA Collapsed vertebra, not elsewhere classified, thoracic region, initial encounter for fracture: Secondary | ICD-10-CM | POA: Diagnosis present

## 2019-05-10 DIAGNOSIS — Y93E1 Activity, personal bathing and showering: Secondary | ICD-10-CM

## 2019-05-10 DIAGNOSIS — R000 Tachycardia, unspecified: Secondary | ICD-10-CM | POA: Diagnosis not present

## 2019-05-10 DIAGNOSIS — I4891 Unspecified atrial fibrillation: Secondary | ICD-10-CM

## 2019-05-10 DIAGNOSIS — E876 Hypokalemia: Secondary | ICD-10-CM | POA: Diagnosis not present

## 2019-05-10 DIAGNOSIS — C9030 Solitary plasmacytoma not having achieved remission: Secondary | ICD-10-CM | POA: Diagnosis present

## 2019-05-10 DIAGNOSIS — M25572 Pain in left ankle and joints of left foot: Secondary | ICD-10-CM | POA: Diagnosis not present

## 2019-05-10 DIAGNOSIS — E559 Vitamin D deficiency, unspecified: Secondary | ICD-10-CM | POA: Diagnosis not present

## 2019-05-10 DIAGNOSIS — S72341A Displaced spiral fracture of shaft of right femur, initial encounter for closed fracture: Principal | ICD-10-CM

## 2019-05-10 DIAGNOSIS — S299XXA Unspecified injury of thorax, initial encounter: Secondary | ICD-10-CM | POA: Diagnosis not present

## 2019-05-10 DIAGNOSIS — Z79890 Hormone replacement therapy (postmenopausal): Secondary | ICD-10-CM | POA: Diagnosis not present

## 2019-05-10 DIAGNOSIS — M899 Disorder of bone, unspecified: Secondary | ICD-10-CM

## 2019-05-10 DIAGNOSIS — E038 Other specified hypothyroidism: Secondary | ICD-10-CM

## 2019-05-10 DIAGNOSIS — W182XXA Fall in (into) shower or empty bathtub, initial encounter: Secondary | ICD-10-CM | POA: Diagnosis present

## 2019-05-10 DIAGNOSIS — I10 Essential (primary) hypertension: Secondary | ICD-10-CM | POA: Diagnosis present

## 2019-05-10 DIAGNOSIS — S72001A Fracture of unspecified part of neck of right femur, initial encounter for closed fracture: Secondary | ICD-10-CM | POA: Diagnosis not present

## 2019-05-10 DIAGNOSIS — M5127 Other intervertebral disc displacement, lumbosacral region: Secondary | ICD-10-CM | POA: Diagnosis not present

## 2019-05-10 DIAGNOSIS — M9984 Other biomechanical lesions of sacral region: Secondary | ICD-10-CM | POA: Diagnosis not present

## 2019-05-10 DIAGNOSIS — M4856XD Collapsed vertebra, not elsewhere classified, lumbar region, subsequent encounter for fracture with routine healing: Secondary | ICD-10-CM | POA: Diagnosis not present

## 2019-05-10 DIAGNOSIS — M6281 Muscle weakness (generalized): Secondary | ICD-10-CM | POA: Diagnosis not present

## 2019-05-10 DIAGNOSIS — S72341D Displaced spiral fracture of shaft of right femur, subsequent encounter for closed fracture with routine healing: Secondary | ICD-10-CM | POA: Diagnosis not present

## 2019-05-10 DIAGNOSIS — J90 Pleural effusion, not elsewhere classified: Secondary | ICD-10-CM | POA: Diagnosis not present

## 2019-05-10 DIAGNOSIS — S7291XA Unspecified fracture of right femur, initial encounter for closed fracture: Secondary | ICD-10-CM | POA: Diagnosis present

## 2019-05-10 DIAGNOSIS — D492 Neoplasm of unspecified behavior of bone, soft tissue, and skin: Secondary | ICD-10-CM | POA: Diagnosis not present

## 2019-05-10 DIAGNOSIS — M2550 Pain in unspecified joint: Secondary | ICD-10-CM | POA: Diagnosis not present

## 2019-05-10 DIAGNOSIS — I959 Hypotension, unspecified: Secondary | ICD-10-CM | POA: Diagnosis not present

## 2019-05-10 DIAGNOSIS — S72301A Unspecified fracture of shaft of right femur, initial encounter for closed fracture: Secondary | ICD-10-CM | POA: Diagnosis not present

## 2019-05-10 DIAGNOSIS — F411 Generalized anxiety disorder: Secondary | ICD-10-CM | POA: Diagnosis not present

## 2019-05-10 DIAGNOSIS — T148XXA Other injury of unspecified body region, initial encounter: Secondary | ICD-10-CM

## 2019-05-10 DIAGNOSIS — S79929A Unspecified injury of unspecified thigh, initial encounter: Secondary | ICD-10-CM | POA: Diagnosis not present

## 2019-05-10 DIAGNOSIS — K5903 Drug induced constipation: Secondary | ICD-10-CM | POA: Diagnosis not present

## 2019-05-10 DIAGNOSIS — Z9181 History of falling: Secondary | ICD-10-CM | POA: Diagnosis not present

## 2019-05-10 DIAGNOSIS — S7291XD Unspecified fracture of right femur, subsequent encounter for closed fracture with routine healing: Secondary | ICD-10-CM | POA: Diagnosis not present

## 2019-05-10 DIAGNOSIS — S32020A Wedge compression fracture of second lumbar vertebra, initial encounter for closed fracture: Secondary | ICD-10-CM | POA: Diagnosis not present

## 2019-05-10 DIAGNOSIS — M533 Sacrococcygeal disorders, not elsewhere classified: Secondary | ICD-10-CM | POA: Diagnosis not present

## 2019-05-10 DIAGNOSIS — M47816 Spondylosis without myelopathy or radiculopathy, lumbar region: Secondary | ICD-10-CM | POA: Diagnosis not present

## 2019-05-10 DIAGNOSIS — Z720 Tobacco use: Secondary | ICD-10-CM | POA: Diagnosis not present

## 2019-05-10 DIAGNOSIS — R262 Difficulty in walking, not elsewhere classified: Secondary | ICD-10-CM | POA: Diagnosis not present

## 2019-05-10 DIAGNOSIS — M48061 Spinal stenosis, lumbar region without neurogenic claudication: Secondary | ICD-10-CM | POA: Diagnosis not present

## 2019-05-10 DIAGNOSIS — K5730 Diverticulosis of large intestine without perforation or abscess without bleeding: Secondary | ICD-10-CM | POA: Diagnosis not present

## 2019-05-10 DIAGNOSIS — D518 Other vitamin B12 deficiency anemias: Secondary | ICD-10-CM

## 2019-05-10 DIAGNOSIS — S7290XA Unspecified fracture of unspecified femur, initial encounter for closed fracture: Secondary | ICD-10-CM

## 2019-05-10 DIAGNOSIS — R52 Pain, unspecified: Secondary | ICD-10-CM | POA: Diagnosis not present

## 2019-05-10 DIAGNOSIS — S72001P Fracture of unspecified part of neck of right femur, subsequent encounter for closed fracture with malunion: Secondary | ICD-10-CM | POA: Diagnosis not present

## 2019-05-10 DIAGNOSIS — S72401A Unspecified fracture of lower end of right femur, initial encounter for closed fracture: Secondary | ICD-10-CM | POA: Diagnosis not present

## 2019-05-10 DIAGNOSIS — S79911A Unspecified injury of right hip, initial encounter: Secondary | ICD-10-CM | POA: Diagnosis not present

## 2019-05-10 DIAGNOSIS — R0902 Hypoxemia: Secondary | ICD-10-CM | POA: Diagnosis not present

## 2019-05-10 LAB — CBC WITH DIFFERENTIAL/PLATELET
Abs Immature Granulocytes: 0.04 10*3/uL (ref 0.00–0.07)
Basophils Absolute: 0 10*3/uL (ref 0.0–0.1)
Basophils Relative: 0 %
Eosinophils Absolute: 0 10*3/uL (ref 0.0–0.5)
Eosinophils Relative: 0 %
HCT: 21.9 % — ABNORMAL LOW (ref 36.0–46.0)
Hemoglobin: 6.9 g/dL — CL (ref 12.0–15.0)
Immature Granulocytes: 1 %
Lymphocytes Relative: 14 %
Lymphs Abs: 0.9 10*3/uL (ref 0.7–4.0)
MCH: 27.8 pg (ref 26.0–34.0)
MCHC: 31.5 g/dL (ref 30.0–36.0)
MCV: 88.3 fL (ref 80.0–100.0)
Monocytes Absolute: 0.4 10*3/uL (ref 0.1–1.0)
Monocytes Relative: 6 %
Neutro Abs: 4.8 10*3/uL (ref 1.7–7.7)
Neutrophils Relative %: 79 %
Platelets: 218 10*3/uL (ref 150–400)
RBC: 2.48 MIL/uL — ABNORMAL LOW (ref 3.87–5.11)
RDW: 14.9 % (ref 11.5–15.5)
WBC: 6.2 10*3/uL (ref 4.0–10.5)
nRBC: 0 % (ref 0.0–0.2)

## 2019-05-10 LAB — BASIC METABOLIC PANEL
Anion gap: 10 (ref 5–15)
BUN: 26 mg/dL — ABNORMAL HIGH (ref 8–23)
CO2: 29 mmol/L (ref 22–32)
Calcium: 8.5 mg/dL — ABNORMAL LOW (ref 8.9–10.3)
Chloride: 96 mmol/L — ABNORMAL LOW (ref 98–111)
Creatinine, Ser: 0.62 mg/dL (ref 0.44–1.00)
GFR calc Af Amer: >60 mL/min (ref >60)
GFR calc non Af Amer: >60 mL/min (ref >60)
Glucose, Bld: 145 mg/dL — ABNORMAL HIGH (ref 70–99)
Potassium: 3.7 mmol/L (ref 3.5–5.1)
Sodium: 135 mmol/L (ref 135–145)

## 2019-05-10 LAB — PROTEIN ELECTROPHORESIS, SERUM
A/G Ratio: 1.3 (ref 0.7–1.7)
Albumin ELP: 3.3 g/dL (ref 2.9–4.4)
Alpha-1-Globulin: 0.3 g/dL (ref 0.0–0.4)
Alpha-2-Globulin: 0.8 g/dL (ref 0.4–1.0)
Beta Globulin: 1.1 g/dL (ref 0.7–1.3)
Gamma Globulin: 0.3 g/dL — ABNORMAL LOW (ref 0.4–1.8)
Globulin, Total: 2.5 g/dL (ref 2.2–3.9)
Total Protein ELP: 5.8 g/dL — ABNORMAL LOW (ref 6.0–8.5)

## 2019-05-10 LAB — RESPIRATORY PANEL BY RT PCR (FLU A&B, COVID)
Influenza A by PCR: NEGATIVE
Influenza B by PCR: NEGATIVE
SARS Coronavirus 2 by RT PCR: NEGATIVE

## 2019-05-10 LAB — PREPARE RBC (CROSSMATCH)

## 2019-05-10 LAB — TSH: TSH: 3.046 u[IU]/mL (ref 0.350–4.500)

## 2019-05-10 LAB — ABO/RH: ABO/RH(D): O POS

## 2019-05-10 LAB — PROTIME-INR
INR: 1 (ref 0.8–1.2)
Prothrombin Time: 13 seconds (ref 11.4–15.2)

## 2019-05-10 LAB — POC OCCULT BLOOD, ED: Fecal Occult Bld: NEGATIVE

## 2019-05-10 LAB — CK: Total CK: 109 U/L (ref 38–234)

## 2019-05-10 MED ORDER — HYPROMELLOSE (GONIOSCOPIC) 2.5 % OP SOLN
1.0000 [drp] | Freq: Four times a day (QID) | OPHTHALMIC | Status: DC
  Administered 2019-05-11 – 2019-05-13 (×9): 1 [drp] via OPHTHALMIC
  Filled 2019-05-10 (×2): qty 15

## 2019-05-10 MED ORDER — ONDANSETRON HCL 4 MG/2ML IJ SOLN
4.0000 mg | Freq: Once | INTRAMUSCULAR | Status: AC
  Administered 2019-05-10: 4 mg via INTRAVENOUS
  Filled 2019-05-10: qty 2

## 2019-05-10 MED ORDER — CEFAZOLIN SODIUM-DEXTROSE 2-4 GM/100ML-% IV SOLN
2.0000 g | INTRAVENOUS | Status: AC
  Administered 2019-05-11: 10:00:00 2 g via INTRAVENOUS
  Filled 2019-05-10: qty 100

## 2019-05-10 MED ORDER — CHLORHEXIDINE GLUCONATE 4 % EX LIQD
60.0000 mL | Freq: Once | CUTANEOUS | Status: AC
  Administered 2019-05-11: 4 via TOPICAL
  Filled 2019-05-10: qty 60

## 2019-05-10 MED ORDER — ONDANSETRON HCL 4 MG/2ML IJ SOLN
4.0000 mg | Freq: Four times a day (QID) | INTRAMUSCULAR | Status: DC | PRN
  Administered 2019-05-11 – 2019-05-17 (×6): 4 mg via INTRAVENOUS
  Filled 2019-05-10 (×5): qty 2

## 2019-05-10 MED ORDER — CARBOXYMETHYLCELLULOSE SODIUM 0.5 % OP SOLN: 1.0000 [drp] | Freq: Four times a day (QID) | OPHTHALMIC | Status: DC

## 2019-05-10 MED ORDER — FENTANYL CITRATE (PF) 100 MCG/2ML IJ SOLN
50.0000 ug | INTRAMUSCULAR | Status: AC | PRN
  Administered 2019-05-10 (×2): 50 ug via INTRAVENOUS
  Filled 2019-05-10 (×2): qty 2

## 2019-05-10 MED ORDER — SODIUM CHLORIDE 0.9% IV SOLUTION: Freq: Once | INTRAVENOUS | Status: AC

## 2019-05-10 MED ORDER — SODIUM CHLORIDE 0.9 % IV SOLN
INTRAVENOUS | Status: DC
  Administered 2019-05-10: 1000 mL via INTRAVENOUS

## 2019-05-10 MED ORDER — ONDANSETRON HCL 4 MG PO TABS: 4.0000 mg | ORAL_TABLET | Freq: Four times a day (QID) | ORAL | Status: DC | PRN

## 2019-05-10 MED ORDER — LEVOTHYROXINE SODIUM 100 MCG PO TABS
100.0000 ug | ORAL_TABLET | Freq: Every day | ORAL | Status: DC
  Administered 2019-05-12 – 2019-05-21 (×10): 100 ug via ORAL
  Filled 2019-05-10 (×9): qty 1

## 2019-05-10 MED ORDER — OXYCODONE HCL 5 MG PO TABS
5.0000 mg | ORAL_TABLET | ORAL | Status: DC | PRN
  Administered 2019-05-11: 5 mg via ORAL
  Filled 2019-05-10: qty 1

## 2019-05-10 MED ORDER — ENOXAPARIN SODIUM 40 MG/0.4ML ~~LOC~~ SOLN: 40.0000 mg | SUBCUTANEOUS | Status: DC

## 2019-05-10 MED ORDER — POVIDONE-IODINE 10 % EX SWAB: 2.0000 "application " | Freq: Once | CUTANEOUS | Status: DC

## 2019-05-10 MED ORDER — METHOCARBAMOL 1000 MG/10ML IJ SOLN
500.0000 mg | Freq: Four times a day (QID) | INTRAVENOUS | Status: DC | PRN
  Filled 2019-05-10 (×3): qty 5

## 2019-05-10 MED ORDER — ALPRAZOLAM 0.5 MG PO TABS
0.5000 mg | ORAL_TABLET | Freq: Every day | ORAL | Status: DC
  Administered 2019-05-11 – 2019-05-20 (×8): 0.5 mg via ORAL
  Filled 2019-05-10 (×9): qty 1

## 2019-05-10 MED ORDER — METHOCARBAMOL 500 MG PO TABS
500.0000 mg | ORAL_TABLET | Freq: Four times a day (QID) | ORAL | Status: DC | PRN
  Administered 2019-05-11 (×2): 500 mg via ORAL
  Filled 2019-05-10 (×2): qty 1

## 2019-05-10 MED ORDER — ENSURE PRE-SURGERY PO LIQD
296.0000 mL | Freq: Once | ORAL | Status: DC
  Filled 2019-05-10: qty 296

## 2019-05-10 MED ORDER — MORPHINE SULFATE (PF) 2 MG/ML IV SOLN
2.0000 mg | INTRAVENOUS | Status: DC | PRN
  Administered 2019-05-10 – 2019-05-11 (×2): 2 mg via INTRAVENOUS
  Filled 2019-05-10 (×2): qty 1

## 2019-05-10 NOTE — ED Provider Notes (Signed)
[facility] EMERGENCY DEPARTMENT Provider Note   CSN: [id number] Arrival date & time: 05/10/19  1403     History Chief Complaint  Patient presents with  . Fall    Courtney Arnold is a 79 y.o. female.  Pt presents to the ED today with a fall and right hip pain.  Pt fell in the shower this morning and c/o right hip pain.  The pt did not hit her head or have a loc.  She was unable to get up and laid on the ground for approx 6 hrs until her daughter came to her house.  Pt was just recently found to have a lytic lesion to her sacrum.  This was found on a CT on 2/23 done in the ED to eval worsening back pain.  Pt saw Dr. Delton Coombes (oncology) yesterday and a PET scan has been ordered for 3/10 to eval for the primary lesion.          Past Medical History:  Diagnosis Date  . Atrial fibrillation ([place]) 2011   Postop, spontaneous conversion to normal sinus after one hour  . Compression fracture 07/24/09   T12; kyphoplasty  . History of echocardiogram 5/11   EF 65%  . Hypertension   . Thyroid disease   . Tobacco abuse     Patient Active Problem List   Diagnosis Date Noted  . Lesion of pelvic bone 05/08/2019  . Vitamin D deficiency 02/03/2016  . 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 History of colonic polyps 11/06/2015  . 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 Cholecystitis, acute 04/25/2015  . Anemia 04/25/2015  . Lower back pain   . Nausea & vomiting 04/21/2015  . History of biliary stent insertion 04/21/2015  . Volume depletion 04/21/2015  . Hypokalemia 04/21/2015  . AP (abdominal pain)   . Compression fracture of L2 lumbar vertebra (H[facility])   . Uncontrollable vomiting   . Common bile duct dilation   . Cholelithiasis 04/12/2015  . Compression fracture 04/12/2015  . Abdominal pain 04/12/2015  . TOBACCO ABUSE 08/21/2009  . ATRIAL FIBRILLATION, HX OF 08/21/2009    Past Surgical History:  Procedure Laterality Date  . BACK SURGERY     . BACK SURGERY  06/06/2015  . BREAST EXCISIONAL BIOPSY Left    50 years ago  benign  . CHOLECYSTECTOMY N/A 04/25/2015   Procedure: LAPAROSCOPIC CHOLECYSTECTOMY WITH INTRAOPERATIVE CHOLANGIOGRAM;  Surgeon: Mickeal Skinner, MD;  Location: [facility];  Service: General;  Laterality: N/A;  . COLONOSCOPY N/A 11/26/2015   Procedure: COLONOSCOPY;  Surgeon: Daneil Dolin, MD;  Location: [facility];  Service: Endoscopy;  Laterality: N/A;  7:30 am  . ERCP N/A 04/14/2015   Procedure: ENDOSCOPIC RETROGRADE CHOLANGIOPANCREATOGRAPHY (ERCP) Biliary Sphincterotomy, 10x7 stent placement Dilated bilary system just not well seen;  Surgeon: Rogene Houston, MD;  Location: [facility];  Service: Endoscopy;  Laterality: N/A;  . ERCP N/A 06/12/2015   Procedure: ENDOSCOPIC RETROGRADE CHOLANGIOPANCREATOGRAPHY (ERCP);  Surgeon: Rogene Houston, MD;  Location: [facility];  Service: Endoscopy;  Laterality: N/A;  . ESOPHAGOGASTRODUODENOSCOPY N/A 06/12/2015   Procedure: DIAGNOSTIC ESOPHAGOGASTRODUODENOSCOPY (EGD);  Surgeon: Rogene Houston, MD;  Location: [facility];  Service: Endoscopy;  Laterality: N/A;  . STENT REMOVAL  06/12/2015   Procedure: STENT REMOVAL ;  Surgeon: Rogene Houston, MD;  Location: [facility];  Service: Endoscopy;;     OB History    Gravida      Para  Term      Preterm      AB      Living  2     SAB      TAB      Ectopic      Multiple      Live Births              Family History  Problem Relation Age of Onset  . Heart attack Father   . Stroke Father   . Breast cancer Sister   . Thyroid disease Neg Hx   . Colon cancer Neg Hx     Social History   Tobacco Use  . Smoking status: Former Smoker    Packs/day: 0.15    Years: 20.00    Pack years: 3.00    Quit date: 03/08/2013    Years since quitting: 6.1  . Smokeless tobacco: Never Used  Substance Use Topics  . Alcohol use: No    Alcohol/week: 0.0 standard drinks  . Drug use: No    Home Medications Prior to  Admission medications   Medication Sig Start Date End Date Taking? Authorizing Provider  ALPRAZolam Duanne Moron) 0.5 MG tablet Take 0.5 mg by mouth 3 (three) times daily. 05/03/19   [provider]  cyclobenzaprine (FLEXERIL) 5 MG tablet Take 5 mg by mouth 3 (three) times daily. 05/03/19   [provider]  HYDROcodone-acetaminophen (NORCO) 5-325 MG tablet Take 1 tablet by mouth every 6 (six) hours as needed for moderate pain. Patient not taking: Reported on [id number]   Delora Fuel, MD  levothyroxine (SYNTHROID) 100 MCG tablet Take 1 tablet (100 mcg total) by mouth daily. 02/19/19   Renato Shin, MD  lidocaine (LIDODERM) 5 % Place 1 patch onto the skin daily. Remove & Discard patch within 12 hours or as directed by MD 05/08/19   Derek Jack, MD  naproxen (NAPROSYN) 500 MG tablet Take 1 tablet (500 mg total) by mouth 2 (two) times daily with a meal. Patient not taking: Reported on 05/08/2019 05/03/19   Triplett, [name], PA-C  ondansetron (ZOFRAN) 4 MG tablet Take 1 tablet (4 mg total) by mouth every 6 (six) hours. As needed for nausea or vomiting Patient not taking: Reported on 05/08/2019 05/03/19   Triplett, Lynelle Smoke, PA-C  Oxycodone HCl 10 MG TABS Take 1 tablet (10 mg total) by mouth every 6 (six) hours as needed. 05/08/19   Derek Jack, MD    Allergies    Patient has no known allergies.  Review of Systems   Review of Systems  Musculoskeletal:       Right hip pain  All other systems reviewed and are negative.   Physical Exam Updated Vital Signs BP (!) 126/38   Pulse 97   Temp 97.7 F (36.5 C) (Oral)   Resp 15   SpO2 99%   Physical Exam Vitals and nursing note reviewed. Exam conducted with a chaperone present.  Constitutional:      Appearance: Normal appearance.  HENT:     Head: Normocephalic and atraumatic.     Right Ear: External ear normal.     Left Ear: External ear normal.     Nose: Nose normal.     Mouth/Throat:     Mouth: Mucous membranes are  moist.     Pharynx: Oropharynx is clear.  Eyes:     Extraocular Movements: Extraocular movements intact.     Conjunctiva/sclera: Conjunctivae normal.     Pupils: Pupils are equal, round, and reactive to light.  Cardiovascular:     Rate and Rhythm: Normal rate.     Pulses: Normal pulses.     Heart sounds: Normal heart sounds.  Pulmonary:     Effort: Pulmonary effort is normal.     Breath sounds: Normal breath sounds.  Abdominal:     General: Abdomen is flat. Bowel sounds are normal.     Palpations: Abdomen is soft.  Genitourinary:    Rectum: Guaiac result negative.  Musculoskeletal:     Cervical back: Normal range of motion and neck supple.     Right hip: Deformity present. Decreased range of motion.       Legs:  Skin:    General: Skin is warm.     Capillary Refill: Capillary refill takes less than 2 seconds.  Neurological:     General: No focal deficit present.     Mental Status: She is alert and oriented to person, place, and time.  Psychiatric:        Mood and Affect: Mood normal.        Behavior: Behavior normal.     ED Results / Procedures / Treatments   Labs (all labs ordered are listed, but only abnormal results are displayed) Labs Reviewed  BASIC METABOLIC PANEL - Abnormal; Notable for the following components:      Result Value   Chloride 96 (*)    Glucose, Bld 145 (*)    BUN 26 (*)    Calcium 8.5 (*)    All other components within normal limits  CBC WITH DIFFERENTIAL/PLATELET - Abnormal; Notable for the following components:   RBC 2.48 (*)    Hemoglobin 6.9 (*)    HCT 21.9 (*)    All other components within normal limits  RESPIRATORY PANEL BY RT PCR (FLU A&B, COVID)  PROTIME-INR  CK  URINALYSIS, ROUTINE W REFLEX MICROSCOPIC  TSH  POC OCCULT BLOOD, ED  TYPE AND SCREEN  PREPARE RBC (CROSSMATCH)    EKG EKG Interpretation  Date/Time:  Thursday May 10 2019 14:20:02 EST Ventricular Rate:  107 PR Interval:    QRS Duration: 95 QT Interval:  348  QTC Calculation: 465 R Axis:   90 Text Interpretation: Sinus tachycardia Borderline right axis deviation Minimal ST elevation, inferior leads No significant change since last tracing Confirmed by Isla Pence [phone]) on 05/10/2019 2:47:14 PM   Radiology DG Chest 1 View  Result Date: 05/10/2019 CLINICAL DATA:  Trauma secondary to a fall in the shower this morning. EXAM: CHEST  1 VIEW COMPARISON:  06/11/2015 FINDINGS: Heart size and pulmonary vascularity are normal. Lungs are clear. No effusions. Multiple old thoracic compression fractures some of which have been treated with vertebroplasty. There is a new compression fracture of T10 since 06/11/2015. IMPRESSION: New compression fracture of T10 since 06/11/2015. No acute cardiopulmonary abnormalities. Electronically Signed   By: Lorriane Shire M.D.   On: 05/10/2019 14:50   DG Knee 1-2 Views Right  Result Date: 05/10/2019 CLINICAL DATA:  Fall EXAM: RIGHT KNEE - 1-2 VIEW COMPARISON:  Concurrent femur radiograph. FINDINGS: Displaced distal femoral diaphysis fracture better assessed on dedicated femur radiographs performed concurrently. Mild tricompartmental degenerative changes of the knee. No other acute fracture or traumatic malalignment is seen. Small effusion is present. IMPRESSION: Displaced distal femoral fracture better assessed on dedicated femur radiographs performed concurrently. Trace knee effusion.  No additional fractures. Electronically Signed   By: Lovena Le M.D.   On: 05/10/2019 15:43   DG Hip Unilat With Pelvis 2-3 Views Right  Result  Date: 05/10/2019 CLINICAL DATA:  Right hip pain since a fall in the shower this morning. EXAM: DG HIP (WITH OR WITHOUT PELVIS) 2-3V RIGHT COMPARISON:  Radiographs dated 03/26/2019 FINDINGS: There is no evidence of hip fracture or dislocation. There is no evidence of arthropathy or other focal bone abnormality. IMPRESSION: Negative. Electronically Signed   By: Lorriane Shire M.D.   On: 05/10/2019 14:51    DG Femur Min 2 Views Right  Result Date: 05/10/2019 CLINICAL DATA:  Fall EXAM: RIGHT FEMUR 2 VIEWS COMPARISON:  Distal femur and right knee pain FINDINGS: Displaced spiral fracture of the distal femoral diaphysis with valgus angulation and external rotation of the lower fracture fragment. No proximal femoral fracture is seen. Femoral head remains normally located. Moderate degenerative changes at the hip. No additional fractures seen distal to the diaphyseal fracture site. Dedicated knee radiographs were performed concurrently. Extensive overlying soft tissue swelling without soft tissue gas to suggest radiographic evidence of compound fracture. IMPRESSION: Displaced spiral fracture of the distal femoral diaphysis with valgus angulation and external rotation of the lower fracture fragment. Electronically Signed   By: Lovena Le M.D.   On: 05/10/2019 15:42    Procedures Procedures (including critical care time)  Medications Ordered in ED Medications  0.9 %  sodium chloride infusion (has no administration in time range)  0.9 %  sodium chloride infusion (Manually program via Guardrails IV Fluids) (has no administration in time range)  fentaNYL (SUBLIMAZE) injection 50 mcg (50 mcg Intravenous Given 05/10/19 1624)  ondansetron (ZOFRAN) injection 4 mg (4 mg Intravenous Given 05/10/19 1459)    ED Course  I have reviewed the triage vital signs and the nursing notes.  Pertinent labs & imaging results that were available during my care of the patient were reviewed by me and considered in my medical decision making (see chart for details).    MDM Rules/Calculators/A&P                      Pt does have a midshaft femur fx.  She has seen Dr. Maureen Ralphs in the past.  Family requests [facility].  Pt d/w Silvestre Gunner (ortho).  Pt's hgb is down from 8.5 on 3/2.  2 units ordered for transfusion.  Pt d/w [facility] (Dr. Venetia Constable) for admission.  They feel ortho need to admit, so I spoke with ortho who will talk  with them.    CRITICAL CARE Performed by: Isla Pence   Total critical care time: 30 minutes  Critical care time was exclusive of separately billable procedures and treating other patients.  Critical care was necessary to treat or prevent imminent or life-threatening deterioration.  Critical care was time spent personally by me on the following activities: development of treatment plan with patient and/or surrogate as well as nursing, discussions with consultants, evaluation of patient's response to treatment, examination of patient, obtaining history from patient or surrogate, ordering and performing treatments and interventions, ordering and review of laboratory studies, ordering and review of radiographic studies, pulse oximetry and re-evaluation of patient's condition.   Final Clinical Impression(s) / ED Diagnoses Final diagnoses:  Closed displaced spiral fracture of shaft of right femur, initial encounter ([place])  Symptomatic anemia  Lytic bone lesions on xray    Rx / DC Orders ED Discharge Orders    None       Isla Pence, MD 05/10/19 1629

## 2019-05-10 NOTE — Consult Note (Signed)
Medical Consultation   Courtney Arnold  QDI:[id number]  DOB: 10/21/1940  DOA: 05/10/2019  PCP: [name], MD    Requesting physician: Dr Rod Can orthopedic surgery  Reason for consultation: Medical clearance   History of Present Illness: Courtney Arnold is an 79 y.o. female WF PMHx atrial fibrillation, compression fracture T12 s/p kyphoplasty, HTN, Anemia, Hypothyroidism, Anxiety.  Is being evaluated for lower back pain, bilateral lower extremity weakness, what patient describes as legs giving out causing her to fall.  Patient was due to have a PET scan next week for further evaluation by Dr. Ellene Route neurosurgery.      Review of Systems:  Review of Systems  Constitutional: Negative.   HENT: Negative.   Eyes: Negative.   Respiratory: Negative.   Cardiovascular: Negative.   Gastrointestinal: Negative.   Genitourinary: Negative.   Musculoskeletal: Positive for back pain, falls and joint pain. Negative for myalgias and neck pain.  Skin: Negative.   Neurological: Positive for dizziness, tingling, tremors, sensory change, speech change, seizures, loss of consciousness and headaches. Negative for focal weakness and weakness.  Endo/Heme/Allergies: Negative.   Psychiatric/Behavioral: Negative.     Past Medical History: Past Medical History:  Diagnosis Date  . Atrial fibrillation ([place]) 2011   Postop, spontaneous conversion to normal sinus after one hour  . Compression fracture 07/24/09   T12; kyphoplasty  . History of echocardiogram 5/11   EF 65%  . Hypertension   . Thyroid disease   . Tobacco abuse     Past Surgical History: Past Surgical History:  Procedure Laterality Date  . BACK SURGERY    . BACK SURGERY  06/06/2015  . BREAST EXCISIONAL BIOPSY Left    50 years ago  benign  . CHOLECYSTECTOMY N/A 04/25/2015   Procedure: LAPAROSCOPIC CHOLECYSTECTOMY WITH INTRAOPERATIVE CHOLANGIOGRAM;  Surgeon: Mickeal Skinner, MD;  Location: [facility];   Service: General;  Laterality: N/A;  . COLONOSCOPY N/A 11/26/2015   Procedure: COLONOSCOPY;  Surgeon: Daneil Dolin, MD;  Location: [facility];  Service: Endoscopy;  Laterality: N/A;  7:30 am  . ERCP N/A 04/14/2015   Procedure: ENDOSCOPIC RETROGRADE CHOLANGIOPANCREATOGRAPHY (ERCP) Biliary Sphincterotomy, 10x7 stent placement Dilated bilary system just not well seen;  Surgeon: Rogene Houston, MD;  Location: [facility];  Service: Endoscopy;  Laterality: N/A;  . ERCP N/A 06/12/2015   Procedure: ENDOSCOPIC RETROGRADE CHOLANGIOPANCREATOGRAPHY (ERCP);  Surgeon: Rogene Houston, MD;  Location: [facility];  Service: Endoscopy;  Laterality: N/A;  . ESOPHAGOGASTRODUODENOSCOPY N/A 06/12/2015   Procedure: DIAGNOSTIC ESOPHAGOGASTRODUODENOSCOPY (EGD);  Surgeon: Rogene Houston, MD;  Location: [facility];  Service: Endoscopy;  Laterality: N/A;  . STENT REMOVAL  06/12/2015   Procedure: STENT REMOVAL ;  Surgeon: Rogene Houston, MD;  Location: [facility];  Service: Endoscopy;;     Allergies:  No Known Allergies   Social History:  reports that she quit smoking about 6 years ago. She has a 3.00 pack-year smoking history. She has never used smokeless tobacco. She reports that she does not drink alcohol or use drugs.   Family History: Family History  Problem Relation Age of Onset  . Heart attack Father   . Stroke Father   . Breast cancer Sister   . Thyroid disease Neg Hx   . Colon cancer Neg Hx       Procedures/Significant Events:  2/23 CT renal stone study; Expansile lytic lesions are present  within the sacrum including a 4.2 cm lesion in the right sacral ala which extends into the right SI joint and a smaller infiltrating lytic focus in the left sacral ala measuring up to 2.4 cm (5/63) and a separate focus more posteriorly measuring 1.9 cm (5/69, heterogeneous marrow elsewhere in the sacrum is conspicuous as well and worrisome for additional site of osseous involvement. -Stable appearance of an  L2 superior endplate compression deformity prior T12 compression deformity with post vertebroplasty changes. -Biliary ductal dilatation, greater than expected for age and reservoir effect. Consider liver serologies and if elevated MRCP could be obtained. 3/4 CXR;New compression fracture of T10 since 06/11/2015. No acute cardiopulmonary abnormalities. 3/4 DG femur RIGHT;Displaced spiral fracture of the distal femoral diaphysis with valgus angulation and external rotation of the lower fracture fragment    I have personally reviewed and interpreted all radiology studies and my findings are as above.     Continuous Infusions: . sodium chloride 1,000 mL (05/10/19 1919)  . [START ON 05/11/2019]  ceFAZolin (ANCEF) IV    . methocarbamol (ROBAXIN) IV       Physical Exam: Vitals:   05/10/19 1749 05/10/19 1800 05/10/19 1806 05/10/19 1852  BP: (!) 112/41 114/60 (!) 110/42   Pulse: (!) 102   (!) 103  Resp: 17 15  16   Temp: 97.7 F (36.5 C)  97.9 F (36.6 C)   TempSrc: Oral  Oral   SpO2: 100%   96%    General: A/O x4, no acute respiratory distress Eyes: negative scleral hemorrhage, negative anisocoria, negative icterus ENT: Negative Runny nose, negative gingival bleeding, Neck:  Negative scars, masses, torticollis, lymphadenopathy, JVD Lungs: Clear to auscultation bilaterally without wheezes or crackles Cardiovascular: Regular rate and rhythm without murmur gallop or rub normal S1 and S2 Abdomen: negative abdominal pain, nondistended, positive soft, bowel sounds, no rebound, no ascites, no appreciable mass Extremities: Positive swelling of RIGHT thigh, painful to palpation, external rotation of lower extremity.  Positive DP/PT pulse Skin: Negative rashes, lesions, ulcers Psychiatric:  Negative depression, negative anxiety, negative fatigue, negative mania  Central nervous system:  Cranial nerves II through XII intact, tongue/uvula midline, all extremities muscle strength 5/5, sensation  intact throughout, negative dysarthria, negative expressive aphasia, negative receptive aphasia.  Data reviewed:  I have personally reviewed following labs and imaging studies Labs:  CBC: Recent Labs  Lab 05/03/19 1926 05/08/19 1421 05/10/19 1459  WBC 4.4 4.8 6.2  NEUTROABS 2.2 2.7 4.8  HGB 8.4* 8.5* 6.9*  HCT 26.4* 27.8* 21.9*  MCV 88.6 90.8 88.3  PLT 153 184 846    Basic Metabolic Panel: Recent Labs  Lab 05/03/19 1926 05/03/19 1926 05/08/19 1421 05/10/19 1459  NA 134*  --  135 135  K 3.5   < > 3.9 3.7  CL 96*  --  98 96*  CO2 28  --  29 29  GLUCOSE 100*  --  90 145*  BUN 29*  --  27* 26*  CREATININE 0.70  --  0.74 0.62  CALCIUM 9.0  --  8.8* 8.5*   < > = values in this interval not displayed.   GFR Estimated Creatinine Clearance: 46.6 mL/min (by C-G formula based on SCr of 0.62 mg/dL). Liver Function Tests: Recent Labs  Lab 05/03/19 1926 05/08/19 1421  AST 16 16  ALT 13 16  ALKPHOS 67 83  BILITOT 0.3 0.5  PROT 6.5 6.7  ALBUMIN 3.5 3.6   No results for input(s): LIPASE, AMYLASE in the last 168 hours.  No results for input(s): AMMONIA in the last 168 hours. Coagulation profile Recent Labs  Lab 05/10/19 1459  INR 1.0    Cardiac Enzymes: Recent Labs  Lab 05/10/19 1459  CKTOTAL 109   BNP: Invalid input(s): POCBNP CBG: No results for input(s): GLUCAP in the last 168 hours. D-Dimer No results for input(s): DDIMER in the last 72 hours. Hgb A1c No results for input(s): HGBA1C in the last 72 hours. Lipid Profile No results for input(s): CHOL, HDL, LDLCALC, TRIG, CHOLHDL, LDLDIRECT in the last 72 hours. Thyroid function studies Recent Labs    05/10/19 1622  TSH 3.046   Anemia work up Recent Labs    05/08/19 1421  VITAMINB12 118*  FOLATE 12.7  FERRITIN 64  TIBC 349  IRON 31  RETICCTPCT 1.4   Urinalysis    Component Value Date/Time   COLORURINE YELLOW 05/01/2019 0424   APPEARANCEUR HAZY (A) 05/01/2019 0424   LABSPEC 1.016 05/01/2019  0424   PHURINE 5.0 05/01/2019 0424   GLUCOSEU NEGATIVE 05/01/2019 0424   HGBUR MODERATE (A) 05/01/2019 0424   BILIRUBINUR NEGATIVE 05/01/2019 0424   KETONESUR NEGATIVE 05/01/2019 0424   PROTEINUR NEGATIVE 05/01/2019 0424   UROBILINOGEN 0.2 10/02/2008 0830   NITRITE NEGATIVE 05/01/2019 0424   LEUKOCYTESUR TRACE (A) 05/01/2019 0424     Microbiology Recent Results (from the past 240 hour(s))  Urine Culture     Status: None   Collection Time: 05/01/19  4:24 AM   Specimen: Urine, Clean Catch  Result Value Ref Range Status   Specimen Description   Final    URINE, CLEAN CATCH Performed at [facility], [address] [place], [place] [address]    Special Requests   Final    NONE Performed at [facility], [address] [place], [place] [address]    Culture   Final    NO GROWTH Performed at [facility], [place] [address], [place],  [address]    Report Status 05/02/2019 FINAL  Final  Respiratory Panel by RT PCR (Flu A&B, Covid) - Nasopharyngeal Swab     Status: None   Collection Time: 05/10/19  3:50 PM   Specimen: Nasopharyngeal Swab  Result Value Ref Range Status   SARS Coronavirus 2 by RT PCR NEGATIVE NEGATIVE Final    Comment: (NOTE) SARS-CoV-2 target nucleic acids are NOT DETECTED. The SARS-CoV-2 RNA is generally detectable in upper respiratoy specimens during the acute phase of infection. The lowest concentration of SARS-CoV-2 viral copies this assay can detect is 131 copies/mL. A negative result does not preclude SARS-Cov-2 infection and should not be used as the sole basis for treatment or other patient management decisions. A negative result may occur with  improper specimen collection/handling, submission of specimen other than nasopharyngeal swab, presence of viral mutation(s) within the areas targeted by this assay, and inadequate number of viral copies (<131 copies/mL). A negative result must be combined with clinical observations, patient history, and  epidemiological information. The expected result is Negative. Fact Sheet for Patients:  PinkCheek.be Fact Sheet for Healthcare Providers:  GravelBags.it This test is not yet ap proved or cleared by the [place] FDA and  has been authorized for detection and/or diagnosis of SARS-CoV-2 by FDA under an Emergency Use Authorization (EUA). This EUA will remain  in effect (meaning this test can be used) for the duration of the COVID-19 declaration under Section 564(b)(1) of the Act, 21 U.S.C. section 360bbb-3(b)(1), unless the authorization is terminated or revoked sooner.    Influenza  A by PCR NEGATIVE NEGATIVE Final   Influenza B by PCR NEGATIVE NEGATIVE Final    Comment: (NOTE) The Xpert Xpress SARS-CoV-2/FLU/RSV assay is intended as an aid in  the diagnosis of influenza from Nasopharyngeal swab specimens and  should not be used as a sole basis for treatment. Nasal washings and  aspirates are unacceptable for Xpert Xpress SARS-CoV-2/FLU/RSV  testing. Fact Sheet for Patients: PinkCheek.be Fact Sheet for Healthcare Providers: GravelBags.it This test is not yet approved or cleared by the [place] FDA and  has been authorized for detection and/or diagnosis of SARS-CoV-2 by  FDA under an Emergency Use Authorization (EUA). This EUA will remain  in effect (meaning this test can be used) for the duration of the  Covid-19 declaration under Section 564(b)(1) of the Act, 21  U.S.C. section 360bbb-3(b)(1), unless the authorization is  terminated or revoked. Performed at [facility], [place] [address] [place], [place] [address]        Inpatient Medications:   Scheduled Meds: . chlorhexidine  60 mL Topical Once  . enoxaparin (LOVENOX) injection  40 mg Subcutaneous Q24H  . feeding supplement  296 mL Oral Once  . povidone-iodine  2 application Topical Once    Continuous Infusions: . sodium chloride 1,000 mL (05/10/19 1919)  . [START ON 05/11/2019]  ceFAZolin (ANCEF) IV    . methocarbamol (ROBAXIN) IV       Radiological Exams on Admission: DG Chest 1 View  Result Date: 05/10/2019 CLINICAL DATA:  Trauma secondary to a fall in the shower this morning. EXAM: CHEST  1 VIEW COMPARISON:  06/11/2015 FINDINGS: Heart size and pulmonary vascularity are normal. Lungs are clear. No effusions. Multiple old thoracic compression fractures some of which have been treated with vertebroplasty. There is a new compression fracture of T10 since 06/11/2015. IMPRESSION: New compression fracture of T10 since 06/11/2015. No acute cardiopulmonary abnormalities. Electronically Signed   By: Lorriane Shire M.D.   On: 05/10/2019 14:50   DG Knee 1-2 Views Right  Result Date: 05/10/2019 CLINICAL DATA:  Fall EXAM: RIGHT KNEE - 1-2 VIEW COMPARISON:  Concurrent femur radiograph. FINDINGS: Displaced distal femoral diaphysis fracture better assessed on dedicated femur radiographs performed concurrently. Mild tricompartmental degenerative changes of the knee. No other acute fracture or traumatic malalignment is seen. Small effusion is present. IMPRESSION: Displaced distal femoral fracture better assessed on dedicated femur radiographs performed concurrently. Trace knee effusion.  No additional fractures. Electronically Signed   By: Lovena Le M.D.   On: 05/10/2019 15:43   DG Hip Unilat With Pelvis 2-3 Views Right  Result Date: 05/10/2019 CLINICAL DATA:  Right hip pain since a fall in the shower this morning. EXAM: DG HIP (WITH OR WITHOUT PELVIS) 2-3V RIGHT COMPARISON:  Radiographs dated 03/26/2019 FINDINGS: There is no evidence of hip fracture or dislocation. There is no evidence of arthropathy or other focal bone abnormality. IMPRESSION: Negative. Electronically Signed   By: Lorriane Shire M.D.   On: 05/10/2019 14:51   DG Femur Min 2 Views Right  Result Date: 05/10/2019 CLINICAL DATA:   Fall EXAM: RIGHT FEMUR 2 VIEWS COMPARISON:  Distal femur and right knee pain FINDINGS: Displaced spiral fracture of the distal femoral diaphysis with valgus angulation and external rotation of the lower fracture fragment. No proximal femoral fracture is seen. Femoral head remains normally located. Moderate degenerative changes at the hip. No additional fractures seen distal to the diaphyseal fracture site. Dedicated knee radiographs were performed concurrently. Extensive overlying soft tissue swelling without  soft tissue gas to suggest radiographic evidence of compound fracture. IMPRESSION: Displaced spiral fracture of the distal femoral diaphysis with valgus angulation and external rotation of the lower fracture fragment. Electronically Signed   By: Lovena Le M.D.   On: 05/10/2019 15:42    Impression/Recommendations Active Problems:   TOBACCO ABUSE   Compression fracture of L2 lumbar vertebra (HCC)   Closed fracture of right femur (HCC)   Hypothyroidism   Unspecified atrial fibrillation (HCC)   Anxiety   Anemia, unspecified   Sacral lytic lesions  Closed fracture RIGHT femur -Repair per orthopedic surgery. -Most likely site of significant loss of blood.  Per patient and daughter no history of anemia, and colonoscopy 2018 was clear.  Negative history of gastric ulcers. -Although patient has some symptoms that could be consistent with multiple myeloma patient should be low risk for surgery, given negative cardiac history (previous past history of A. fib not treated), or respiratory history.  Compression fracture L2 lumbar vertebrae/Sacral lytic lesions -Patient has multiple sacral lytic lesions and new L2 lumbar vertebrae fracture, which may have been the cause/contributed to patient's initial fall.  Patient was seeing Dr. Ellene Route neurosurgery for work-up.  If patient is to be in hospital for several days post femur repair recommend consulting Dr. Ellene Route -Lesions could be consistent with  multiple myeloma -UPEP pending -SPEP pending -Kappa/lambda free light chain pending  Unspecified atrial fibrillation -Currently NSR -Not on home medication for A. fib. -Would monitor closely given stress of current situation though patient has not been in A. fib may cause her to lapse into atrial fibrillation. -Transfuse for hemoglobin<7  Anemia -See closed fracture -Occult blood  Hypothyroidism -Synthroid 100 mcg daily  Anxiety -Xanax 0.5 mg qhs    Thank you for this consultation.  Our [facility] hospitalist team will follow the patient with you.   Time Spent: 60-minute  Cyera Balboni, Geraldo Docker M.D. [facility] 05/10/2019, 7:22 PM  [phone]

## 2019-05-10 NOTE — Progress Notes (Signed)
Orthopedic Tech Progress Note Patient Details:  Courtney Arnold 22-Nov-1940 [id number] Removed and reapplied traction for patient for CT scan.  Musculoskeletal Traction Type of Traction: Bucks Skin Traction Traction Location: RLE Traction Weight: 10 lbs   Post Interventions Patient Tolerated: Poor Instructions Provided: Care of device   [name] N Courtney Arnold 05/10/2019, 9:16 PM

## 2019-05-10 NOTE — Progress Notes (Signed)
Orthopedic Tech Progress Note Patient Details:  Courtney Arnold [birth year] [id number]  Musculoskeletal Traction Type of Traction: Bucks Skin Traction Traction Location: RLE Traction Weight: 10 lbs   Post Interventions Patient Tolerated: Well Instructions Provided: Care of [place] 05/10/2019, 7:01 PM

## 2019-05-10 NOTE — Consult Note (Signed)
Reason for Consult:Femur fx Referring Physician: J Christyana Age is an 79 y.o. female.  HPI: Courtney Arnold slipped in the shower today and had immediate right leg pain and could not get up. She was brought to the ED where x-rays showed a right femur fx and orthopedic surgery was consulted. She c/o localized pain to the area.  Past Medical History:  Diagnosis Date  . Atrial fibrillation ([place]) 2011   Postop, spontaneous conversion to normal sinus after one hour  . Compression fracture 07/24/09   T12; kyphoplasty  . History of echocardiogram 5/11   EF 65%  . Hypertension   . Thyroid disease   . Tobacco abuse     Past Surgical History:  Procedure Laterality Date  . BACK SURGERY    . BACK SURGERY  06/06/2015  . BREAST EXCISIONAL BIOPSY Left    50 years ago  benign  . CHOLECYSTECTOMY N/A 04/25/2015   Procedure: LAPAROSCOPIC CHOLECYSTECTOMY WITH INTRAOPERATIVE CHOLANGIOGRAM;  Surgeon: Mickeal Skinner, MD;  Location: [facility];  Service: General;  Laterality: N/A;  . COLONOSCOPY N/A 11/26/2015   Procedure: COLONOSCOPY;  Surgeon: Daneil Dolin, MD;  Location: [facility];  Service: Endoscopy;  Laterality: N/A;  7:30 am  . ERCP N/A 04/14/2015   Procedure: ENDOSCOPIC RETROGRADE CHOLANGIOPANCREATOGRAPHY (ERCP) Biliary Sphincterotomy, 10x7 stent placement Dilated bilary system just not well seen;  Surgeon: Rogene Houston, MD;  Location: [facility];  Service: Endoscopy;  Laterality: N/A;  . ERCP N/A 06/12/2015   Procedure: ENDOSCOPIC RETROGRADE CHOLANGIOPANCREATOGRAPHY (ERCP);  Surgeon: Rogene Houston, MD;  Location: [facility];  Service: Endoscopy;  Laterality: N/A;  . ESOPHAGOGASTRODUODENOSCOPY N/A 06/12/2015   Procedure: DIAGNOSTIC ESOPHAGOGASTRODUODENOSCOPY (EGD);  Surgeon: Rogene Houston, MD;  Location[facility];  Service: Endoscopy;  Laterality: N/A;  . STENT REMOVAL  06/12/2015   Procedure: STENT REMOVAL ;  Surgeon: Rogene Houston, MD;  Location: [facility];  Service:  Endoscopy;;    Family History  Problem Relation Age of Onset  . Heart attack Father   . Stroke Father   . Breast cancer Sister   . Thyroid disease Neg Hx   . Colon cancer Neg Hx     Social History:  reports that she quit smoking about 6 years ago. She has a 3.00 pack-year smoking history. She has never used smokeless tobacco. She reports that she does not drink alcohol or use drugs.  Allergies: No Known Allergies  Medications: I have reviewed the patient's current medications.  Results for orders placed or performed during the hospital encounter of 05/10/19 (from the past 48 hour(s))  Basic metabolic panel     Status: Abnormal   Collection Time: 05/10/19  2:59 PM  Result Value Ref Range   Sodium 135 135 - 145 mmol/L   Potassium 3.7 3.5 - 5.1 mmol/L   Chloride 96 (L) 98 - 111 mmol/L   CO2 29 22 - 32 mmol/L   Glucose, Bld 145 (H) 70 - 99 mg/dL    Comment: Glucose reference range applies only to samples taken after fasting for at least 8 hours.   BUN 26 (H) 8 - 23 mg/dL   Creatinine, Ser 0.62 0.44 - 1.00 mg/dL   Calcium 8.5 (L) 8.9 - 10.3 mg/dL   GFR calc non Af Amer >60 >60 mL/min   GFR calc Af Amer >60 >60 mL/min   Anion gap 10 5 - 15    Comment: Performed at [facility]  [address] [place], [place] [address]  CBC WITH DIFFERENTIAL     Status: Abnormal   Collection Time: 05/10/19  2:59 PM  Result Value Ref Range   WBC 6.2 4.0 - 10.5 K/uL   RBC 2.48 (L) 3.87 - 5.11 MIL/uL   Hemoglobin 6.9 (LL) 12.0 - 15.0 g/dL    Comment: REPEATED TO VERIFY THIS CRITICAL RESULT HAS VERIFIED AND BEEN CALLED TO K KIRK,RN BY DENNIS BRADLEY ON 03 04 2021 AT 22, AND HAS BEEN READ BACK.     HCT 21.9 (L) 36.0 - 46.0 %   MCV 88.3 80.0 - 100.0 fL   MCH 27.8 26.0 - 34.0 pg   MCHC 31.5 30.0 - 36.0 g/dL   RDW 14.9 11.5 - 15.5 %   Platelets 218 150 - 400 K/uL   nRBC 0.0 0.0 - 0.2 %   Neutrophils Relative % 79 %   Neutro Abs 4.8 1.7 - 7.7 K/uL   Lymphocytes Relative 14 %   Lymphs  Abs 0.9 0.7 - 4.0 K/uL   Monocytes Relative 6 %   Monocytes Absolute 0.4 0.1 - 1.0 K/uL   Eosinophils Relative 0 %   Eosinophils Absolute 0.0 0.0 - 0.5 K/uL   Basophils Relative 0 %   Basophils Absolute 0.0 0.0 - 0.1 K/uL   Immature Granulocytes 1 %   Abs Immature Granulocytes 0.04 0.00 - 0.07 K/uL    Comment: Performed at [facility] [address] [place], [place] [address]  Protime-INR     Status: None   Collection Time: 05/10/19  2:59 PM  Result Value Ref Range   Prothrombin Time 13.0 11.4 - 15.2 seconds   INR 1.0 0.8 - 1.2    Comment: (NOTE) INR goal varies based on device and disease states. Performed at [facility], [place] [address], [place], [place] [address]   CK     Status: None   Collection Time: 05/10/19  2:59 PM  Result Value Ref Range   Total CK 109 38 - 234 U/L    Comment: Performed at [facility], [place] [address] [place], [place] [address]  POC occult blood, ED Provider will collect     Status: None   Collection Time: 05/10/19  4:01 PM  Result Value Ref Range   Fecal Occult Bld NEGATIVE NEGATIVE    DG Chest 1 View  Result Date: 05/10/2019 CLINICAL DATA:  Trauma secondary to a fall in the shower this morning. EXAM: CHEST  1 VIEW COMPARISON:  06/11/2015 FINDINGS: Heart size and pulmonary vascularity are normal. Lungs are clear. No effusions. Multiple old thoracic compression fractures some of which have been treated with vertebroplasty. There is a new compression fracture of T10 since 06/11/2015. IMPRESSION: New compression fracture of T10 since 06/11/2015. No acute cardiopulmonary abnormalities. Electronically Signed   By: Lorriane Shire M.D.   On: 05/10/2019 14:50   DG Knee 1-2 Views Right  Result Date: 05/10/2019 CLINICAL DATA:  Fall EXAM: RIGHT KNEE - 1-2 VIEW COMPARISON:  Concurrent femur radiograph. FINDINGS: Displaced distal femoral diaphysis fracture better assessed on dedicated femur radiographs performed concurrently. Mild  tricompartmental degenerative changes of the knee. No other acute fracture or traumatic malalignment is seen. Small effusion is present. IMPRESSION: Displaced distal femoral fracture better assessed on dedicated femur radiographs performed concurrently. Trace knee effusion.  No additional fractures. Electronically Signed   By: Lovena Le M.D.   On: 05/10/2019 15:43   DG Hip Unilat With Pelvis 2-3 Views Right  Result Date: 05/10/2019 CLINICAL DATA:  Right hip pain since a fall in the shower this morning. EXAM: DG HIP (WITH OR WITHOUT PELVIS) 2-3V RIGHT COMPARISON:  Radiographs dated 03/26/2019 FINDINGS: There is no evidence of hip fracture or dislocation. There is no evidence of arthropathy or other focal bone abnormality. IMPRESSION: Negative. Electronically Signed   By: Lorriane Shire M.D.   On: 05/10/2019 14:51   DG Femur Min 2 Views Right  Result Date: 05/10/2019 CLINICAL DATA:  Fall EXAM: RIGHT FEMUR 2 VIEWS COMPARISON:  Distal femur and right knee pain FINDINGS: Displaced spiral fracture of the distal femoral diaphysis with valgus angulation and external rotation of the lower fracture fragment. No proximal femoral fracture is seen. Femoral head remains normally located. Moderate degenerative changes at the hip. No additional fractures seen distal to the diaphyseal fracture site. Dedicated knee radiographs were performed concurrently. Extensive overlying soft tissue swelling without soft tissue gas to suggest radiographic evidence of compound fracture. IMPRESSION: Displaced spiral fracture of the distal femoral diaphysis with valgus angulation and external rotation of the lower fracture fragment. Electronically Signed   By: Lovena Le M.D.   On: 05/10/2019 15:42    Review of Systems  HENT: Negative for ear discharge, ear pain, hearing loss and tinnitus.   Eyes: Negative for photophobia and pain.  Respiratory: Negative for cough and shortness of breath.   Cardiovascular: Negative for chest pain.   Gastrointestinal: Negative for abdominal pain, nausea and vomiting.  Genitourinary: Negative for dysuria, flank pain, frequency and urgency.  Musculoskeletal: Positive for arthralgias (Right femur). Negative for back pain, myalgias and neck pain.  Neurological: Negative for dizziness and headaches.  Hematological: Does not bruise/bleed easily.  Psychiatric/Behavioral: The patient is not nervous/anxious.    Blood pressure (!) 126/38, pulse 97, temperature 97.7 F (36.5 C), temperature source Oral, resp. rate 15, SpO2 100 %. Physical Exam  Constitutional: She appears well-developed and well-nourished. No distress.  HENT:  Head: Normocephalic and atraumatic.  Eyes: Conjunctivae are normal. Right eye exhibits no discharge. Left eye exhibits no discharge. No scleral icterus.  Cardiovascular: Normal rate and regular rhythm.  Respiratory: Effort normal. No respiratory distress.  Musculoskeletal:     Cervical back: Normal range of motion.     Comments: RLE No traumatic wounds, ecchymosis, or rash  Leg externally rotated and short, severe TTP thigh  No knee or ankle effusion  Knee stable to varus/ valgus and anterior/posterior stress grossly  Sens DPN, SPN, TN intact  Motor EHL, ext, flex, evers 5/5  DP 1+, PT 1+, No significant edema  Neurological: She is alert.  Skin: Skin is warm and dry. She is not diaphoretic.  Psychiatric: She has a normal mood and affect. Her behavior is normal.    Assessment/Plan: Right femur fx -- Plan IMN tomorrow by Dr. Lyla Glassing. NPO after MN. Multiple medical problems including atrial fib not on anticoagulants, HTN, compression fxs, anemia, thyroid disease, and lytic lesions in her sacrum that are undergoing workup -- Will ask medicine to clear for surgery    Lisette Abu, PA-C Orthopedic Surgery [phone] 05/10/2019, 4:32 PM

## 2019-05-10 NOTE — ED Triage Notes (Signed)
Pt BIB [facility] from home following a fall in the shower this am, pt c/o R hip pain,possible dislocation, R leg rotation and shortening. Pt denies head trauma, no loss of memory. Pt received 50 mcg Fentanyl per EMS.Pt O2 sats 90% upon arrival per EMS, placed on 2 L [place], 99%. Hx poss cancer, lesions found on hips, torn Right meniscus.

## 2019-05-10 NOTE — ED Notes (Signed)
Patient transported to X-ray 

## 2019-05-11 ENCOUNTER — Inpatient Hospital Stay (HOSPITAL_COMMUNITY): Payer: Medicare Other | Admitting: Anesthesiology

## 2019-05-11 ENCOUNTER — Inpatient Hospital Stay (HOSPITAL_COMMUNITY): Payer: Medicare Other

## 2019-05-11 ENCOUNTER — Encounter (HOSPITAL_COMMUNITY): Payer: Self-pay | Admitting: Orthopedic Surgery

## 2019-05-11 ENCOUNTER — Other Ambulatory Visit: Payer: Self-pay

## 2019-05-11 ENCOUNTER — Encounter (HOSPITAL_COMMUNITY)
Admission: EM | Disposition: A | Discharge status: Skilled Nursing Facility | Payer: Self-pay | Source: Home / Self Care | Attending: Student

## 2019-05-11 HISTORY — PX: FEMUR IM NAIL: SHX1597

## 2019-05-11 LAB — URINALYSIS, ROUTINE W REFLEX MICROSCOPIC
Bilirubin Urine: NEGATIVE
Glucose, UA: NEGATIVE mg/dL
Ketones, ur: NEGATIVE mg/dL
Leukocytes,Ua: NEGATIVE
Nitrite: NEGATIVE
Protein, ur: NEGATIVE mg/dL
Specific Gravity, Urine: 1.021 (ref 1.005–1.030)
pH: 5 (ref 5.0–8.0)

## 2019-05-11 LAB — CBC WITH DIFFERENTIAL/PLATELET
Abs Immature Granulocytes: 0.02 10*3/uL (ref 0.00–0.07)
Basophils Absolute: 0 10*3/uL (ref 0.0–0.1)
Basophils Relative: 0 %
Eosinophils Absolute: 0 10*3/uL (ref 0.0–0.5)
Eosinophils Relative: 1 %
HCT: 31.8 % — ABNORMAL LOW (ref 36.0–46.0)
Hemoglobin: 10.4 g/dL — ABNORMAL LOW (ref 12.0–15.0)
Immature Granulocytes: 1 %
Lymphocytes Relative: 28 %
Lymphs Abs: 1.1 10*3/uL (ref 0.7–4.0)
MCH: 28.3 pg (ref 26.0–34.0)
MCHC: 32.7 g/dL (ref 30.0–36.0)
MCV: 86.6 fL (ref 80.0–100.0)
Monocytes Absolute: 0.4 10*3/uL (ref 0.1–1.0)
Monocytes Relative: 9 %
Neutro Abs: 2.5 10*3/uL (ref 1.7–7.7)
Neutrophils Relative %: 61 %
Platelets: 154 10*3/uL (ref 150–400)
RBC: 3.67 MIL/uL — ABNORMAL LOW (ref 3.87–5.11)
RDW: 14.9 % (ref 11.5–15.5)
WBC: 4.1 10*3/uL (ref 4.0–10.5)
nRBC: 0 % (ref 0.0–0.2)

## 2019-05-11 LAB — COMPREHENSIVE METABOLIC PANEL
ALT: 11 U/L (ref 0–44)
AST: 15 U/L (ref 15–41)
Albumin: 2.6 g/dL — ABNORMAL LOW (ref 3.5–5.0)
Alkaline Phosphatase: 67 U/L (ref 38–126)
Anion gap: 8 (ref 5–15)
BUN: 21 mg/dL (ref 8–23)
CO2: 29 mmol/L (ref 22–32)
Calcium: 8.4 mg/dL — ABNORMAL LOW (ref 8.9–10.3)
Chloride: 99 mmol/L (ref 98–111)
Creatinine, Ser: 0.68 mg/dL (ref 0.44–1.00)
GFR calc Af Amer: >60 mL/min (ref >60)
GFR calc non Af Amer: >60 mL/min (ref >60)
Glucose, Bld: 104 mg/dL — ABNORMAL HIGH (ref 70–99)
Potassium: 3.8 mmol/L (ref 3.5–5.1)
Sodium: 136 mmol/L (ref 135–145)
Total Bilirubin: 0.5 mg/dL (ref 0.3–1.2)
Total Protein: 5.1 g/dL — ABNORMAL LOW (ref 6.5–8.1)

## 2019-05-11 LAB — PROTIME-INR
INR: 1 (ref 0.8–1.2)
Prothrombin Time: 13.5 seconds (ref 11.4–15.2)

## 2019-05-11 LAB — HEMOGLOBIN AND HEMATOCRIT, BLOOD
HCT: 23.1 % — ABNORMAL LOW (ref 36.0–46.0)
Hemoglobin: 7.4 g/dL — ABNORMAL LOW (ref 12.0–15.0)

## 2019-05-11 LAB — MRSA PCR SCREENING: MRSA by PCR: NEGATIVE

## 2019-05-11 LAB — COPPER, SERUM: Copper: 116 ug/dL (ref 80–158)

## 2019-05-11 LAB — MAGNESIUM: Magnesium: 1.9 mg/dL (ref 1.7–2.4)

## 2019-05-11 SURGERY — INSERTION, INTRAMEDULLARY ROD, FEMUR
Anesthesia: Spinal | Site: Leg Upper | Laterality: Right

## 2019-05-11 MED ORDER — CEFAZOLIN SODIUM-DEXTROSE 2-4 GM/100ML-% IV SOLN
2.0000 g | Freq: Three times a day (TID) | INTRAVENOUS | Status: AC
  Administered 2019-05-11 – 2019-05-12 (×3): 2 g via INTRAVENOUS
  Filled 2019-05-11 (×3): qty 100

## 2019-05-11 MED ORDER — FENTANYL CITRATE (PF) 100 MCG/2ML IJ SOLN: 25.0000 ug | INTRAMUSCULAR | Status: DC | PRN

## 2019-05-11 MED ORDER — VANCOMYCIN HCL 500 MG IV SOLR
INTRAVENOUS | Status: DC | PRN
  Administered 2019-05-11: 500 mg via TOPICAL

## 2019-05-11 MED ORDER — ENOXAPARIN SODIUM 40 MG/0.4ML ~~LOC~~ SOLN: 40.0000 mg | SUBCUTANEOUS | Status: DC

## 2019-05-11 MED ORDER — OXYCODONE HCL 5 MG PO TABS
5.0000 mg | ORAL_TABLET | ORAL | Status: DC | PRN
  Administered 2019-05-11 – 2019-05-12 (×2): 10 mg via ORAL
  Administered 2019-05-14: 5 mg via ORAL
  Filled 2019-05-11 (×3): qty 2
  Filled 2019-05-11: qty 1

## 2019-05-11 MED ORDER — ALBUMIN HUMAN 5 % IV SOLN: INTRAVENOUS | Status: DC | PRN

## 2019-05-11 MED ORDER — ONDANSETRON HCL 4 MG/2ML IJ SOLN: 4.0000 mg | Freq: Once | INTRAMUSCULAR | Status: DC | PRN

## 2019-05-11 MED ORDER — MEPIVACAINE HCL (PF) 2 % IJ SOLN
INTRAMUSCULAR | Status: DC | PRN
  Administered 2019-05-11: 3.5 mL via EPIDURAL

## 2019-05-11 MED ORDER — PHENYLEPHRINE HCL-NACL 10-0.9 MG/250ML-% IV SOLN
INTRAVENOUS | Status: DC | PRN
  Administered 2019-05-11: 75 ug/min via INTRAVENOUS

## 2019-05-11 MED ORDER — PHENYLEPHRINE 40 MCG/ML (10ML) SYRINGE FOR IV PUSH (FOR BLOOD PRESSURE SUPPORT)
PREFILLED_SYRINGE | INTRAVENOUS | Status: AC
  Filled 2019-05-11: qty 10

## 2019-05-11 MED ORDER — MIDAZOLAM HCL 2 MG/2ML IJ SOLN
INTRAMUSCULAR | Status: AC
  Filled 2019-05-11: qty 2

## 2019-05-11 MED ORDER — 0.9 % SODIUM CHLORIDE (POUR BTL) OPTIME
TOPICAL | Status: DC | PRN
  Administered 2019-05-11: 1000 mL

## 2019-05-11 MED ORDER — CHLORHEXIDINE GLUCONATE CLOTH 2 % EX PADS
6.0000 | MEDICATED_PAD | Freq: Every day | CUTANEOUS | Status: DC
  Administered 2019-05-11 – 2019-05-16 (×6): 6 via TOPICAL

## 2019-05-11 MED ORDER — PROPOFOL 500 MG/50ML IV EMUL
INTRAVENOUS | Status: DC | PRN
  Administered 2019-05-11: 100 ug/kg/min via INTRAVENOUS

## 2019-05-11 MED ORDER — FENTANYL CITRATE (PF) 250 MCG/5ML IJ SOLN
INTRAMUSCULAR | Status: AC
  Filled 2019-05-11: qty 5

## 2019-05-11 MED ORDER — VANCOMYCIN HCL 500 MG IV SOLR
INTRAVENOUS | Status: AC
  Filled 2019-05-11: qty 500

## 2019-05-11 MED ORDER — FENTANYL CITRATE (PF) 100 MCG/2ML IJ SOLN
INTRAMUSCULAR | Status: DC | PRN
  Administered 2019-05-11: 50 ug via INTRAVENOUS

## 2019-05-11 MED ORDER — ONDANSETRON HCL 4 MG/2ML IJ SOLN
INTRAMUSCULAR | Status: AC
  Filled 2019-05-11: qty 2

## 2019-05-11 MED ORDER — MORPHINE SULFATE (PF) 2 MG/ML IV SOLN
1.0000 mg | INTRAVENOUS | Status: DC | PRN
  Administered 2019-05-11 (×2): 2 mg via INTRAVENOUS
  Filled 2019-05-11 (×2): qty 1

## 2019-05-11 MED ORDER — LACTATED RINGERS IV SOLN: INTRAVENOUS | Status: DC | PRN

## 2019-05-11 MED ORDER — PHENYLEPHRINE HCL (PRESSORS) 10 MG/ML IV SOLN
INTRAVENOUS | Status: DC | PRN
  Administered 2019-05-11 (×2): 120 ug via INTRAVENOUS
  Administered 2019-05-11: 80 ug via INTRAVENOUS

## 2019-05-11 MED ORDER — ACETAMINOPHEN 500 MG PO TABS: 1000.0000 mg | ORAL_TABLET | Freq: Once | ORAL | Status: DC

## 2019-05-11 MED ORDER — ACETAMINOPHEN 500 MG PO TABS
1000.0000 mg | ORAL_TABLET | Freq: Four times a day (QID) | ORAL | Status: DC
  Administered 2019-05-11 – 2019-05-16 (×15): 1000 mg via ORAL
  Filled 2019-05-11 (×15): qty 2

## 2019-05-11 SURGICAL SUPPLY — 72 items
BIT DRILL CALIBRATED 4.3MMX365 (DRILL) ×1 IMPLANT
BIT DRILL CROWE PNT TWST 4.5MM (DRILL) ×2 IMPLANT
BLADE SURG 10 STRL SS (BLADE) ×6 IMPLANT
BNDG COHESIVE 4X5 TAN STRL (GAUZE/BANDAGES/DRESSINGS) IMPLANT
BNDG COHESIVE 6X5 TAN STRL LF (GAUZE/BANDAGES/DRESSINGS) IMPLANT
BNDG ELASTIC 4X5.8 VLCR STR LF (GAUZE/BANDAGES/DRESSINGS) ×3 IMPLANT
BNDG ELASTIC 6X10 VLCR STRL LF (GAUZE/BANDAGES/DRESSINGS) ×3 IMPLANT
BNDG ELASTIC 6X5.8 VLCR STR LF (GAUZE/BANDAGES/DRESSINGS) ×3 IMPLANT
BRUSH SCRUB EZ PLAIN DRY (MISCELLANEOUS) ×6 IMPLANT
CHLORAPREP W/TINT 26 (MISCELLANEOUS) ×3 IMPLANT
COVER SURGICAL LIGHT HANDLE (MISCELLANEOUS) ×3 IMPLANT
COVER WAND RF STERILE (DRAPES) IMPLANT
DERMABOND ADHESIVE PROPEN (GAUZE/BANDAGES/DRESSINGS) ×2
DERMABOND ADVANCED .7 DNX6 (GAUZE/BANDAGES/DRESSINGS) ×1 IMPLANT
DRAPE C-ARM 35X43 STRL (DRAPES) ×3 IMPLANT
DRAPE C-ARMOR (DRAPES) ×3 IMPLANT
DRAPE HALF SHEET 40X57 (DRAPES) IMPLANT
DRAPE IMP U-DRAPE 54X76 (DRAPES) ×6 IMPLANT
DRAPE INCISE IOBAN 66X45 STRL (DRAPES) ×3 IMPLANT
DRAPE ORTHO SPLIT 77X108 STRL (DRAPES) ×6
DRAPE SURG 17X23 STRL (DRAPES) ×3 IMPLANT
DRAPE SURG ORHT 6 SPLT 77X108 (DRAPES) ×2 IMPLANT
DRAPE U-SHAPE 47X51 STRL (DRAPES) ×3 IMPLANT
DRILL CALIBRATED 4.3MMX365 (DRILL) ×3
DRILL CROWE POINT TWIST 4.5MM (DRILL) ×6
DRSG MEPILEX BORDER 4X4 (GAUZE/BANDAGES/DRESSINGS) ×3 IMPLANT
DRSG MEPILEX BORDER 4X8 (GAUZE/BANDAGES/DRESSINGS) ×3 IMPLANT
DRSG MEPILEX SACRM 8.7X9.8 (GAUZE/BANDAGES/DRESSINGS) ×3 IMPLANT
ELECT REM PT RETURN 9FT ADLT (ELECTROSURGICAL) ×3
ELECTRODE REM PT RTRN 9FT ADLT (ELECTROSURGICAL) ×1 IMPLANT
GLOVE BIO SURGEON STRL SZ 6.5 (GLOVE) ×4 IMPLANT
GLOVE BIO SURGEON STRL SZ7.5 (GLOVE) ×9 IMPLANT
GLOVE BIO SURGEONS STRL SZ 6.5 (GLOVE) ×2
GLOVE BIOGEL PI IND STRL 6.5 (GLOVE) ×1 IMPLANT
GLOVE BIOGEL PI IND STRL 7.5 (GLOVE) ×1 IMPLANT
GLOVE BIOGEL PI INDICATOR 6.5 (GLOVE) ×2
GLOVE BIOGEL PI INDICATOR 7.5 (GLOVE) ×2
GLOVE SURG SS PI 6.5 STRL IVOR (GLOVE) ×6 IMPLANT
GOWN STRL REUS W/ TWL LRG LVL3 (GOWN DISPOSABLE) ×3 IMPLANT
GOWN STRL REUS W/ TWL XL LVL3 (GOWN DISPOSABLE) ×1 IMPLANT
GOWN STRL REUS W/TWL LRG LVL3 (GOWN DISPOSABLE) ×9
GOWN STRL REUS W/TWL XL LVL3 (GOWN DISPOSABLE) ×3
GUIDEPIN 3.2X17.5 THRD DISP (PIN) ×3 IMPLANT
GUIDEWIRE BEAD TIP (WIRE) ×3 IMPLANT
KIT BASIN OR (CUSTOM PROCEDURE TRAY) ×3 IMPLANT
KIT TURNOVER KIT B (KITS) ×3 IMPLANT
MANIFOLD NEPTUNE II (INSTRUMENTS) IMPLANT
NAIL FEM RETRO 10.5X340 (Nail) ×3 IMPLANT
NS IRRIG 1000ML POUR BTL (IV SOLUTION) ×3 IMPLANT
PACK GENERAL/GYN (CUSTOM PROCEDURE TRAY) ×3 IMPLANT
PACK UNIVERSAL I (CUSTOM PROCEDURE TRAY) ×3 IMPLANT
PAD ARMBOARD 7.5X6 YLW CONV (MISCELLANEOUS) ×6 IMPLANT
PADDING CAST ABS 6INX4YD NS (CAST SUPPLIES) ×2
PADDING CAST ABS COTTON 6X4 NS (CAST SUPPLIES) ×1 IMPLANT
SCREW CORT TI DBL LEAD 5X32 (Screw) ×3 IMPLANT
SCREW CORT TI DBL LEAD 5X36 (Screw) ×3 IMPLANT
SCREW CORT TI DBL LEAD 5X60 (Screw) ×3 IMPLANT
SCREW CORT TI DBL LEAD 5X70 (Screw) ×3 IMPLANT
SCREW CORT TI DBL LEAD 5X75 (Screw) ×3 IMPLANT
SCREW CORT TI DBLE LEAD 5X54 (Screw) ×3 IMPLANT
STAPLER VISISTAT 35W (STAPLE) IMPLANT
STOCKINETTE IMPERVIOUS LG (DRAPES) ×3 IMPLANT
SUT ETHILON 3 0 PS 1 (SUTURE) IMPLANT
SUT MNCRL AB 3-0 PS2 18 (SUTURE) ×3 IMPLANT
SUT VIC AB 0 CT1 27 (SUTURE) ×3
SUT VIC AB 0 CT1 27XBRD ANBCTR (SUTURE) ×1 IMPLANT
SUT VIC AB 2-0 CT1 27 (SUTURE) ×3
SUT VIC AB 2-0 CT1 TAPERPNT 27 (SUTURE) ×1 IMPLANT
TOWEL GREEN STERILE (TOWEL DISPOSABLE) ×6 IMPLANT
TOWEL GREEN STERILE FF (TOWEL DISPOSABLE) ×3 IMPLANT
UNDERPAD 30X30 (UNDERPADS AND DIAPERS) ×3 IMPLANT
WATER STERILE IRR 1000ML POUR (IV SOLUTION) ×3 IMPLANT

## 2019-05-11 NOTE — Op Note (Signed)
Orthopaedic Surgery Operative Note (CSN: [id number] ) Date of Surgery: 05/11/2019  Admit Date: 05/10/2019   Diagnoses: Pre-Op Diagnoses: Right femoral shaft fracture  Post-Op Diagnosis: Same  Procedures: CPT 27506-Retrograde intramedullary nailing of right femoral shaft fracture  Surgeons : Primary: Bradden Tadros, Thomasene Lot, MD  Assistant: Patrecia Pace, PA-C  Location: OR 3   Anesthesia: Spinal  Antibiotics: Ancef 2g preop, 1 gm vancomycin powder topically   Tourniquet time: None  Estimated Blood 123XX123 mL  Complications:None   Specimens:None   Implants: Implant Name Type Inv. Item Serial No. Manufacturer Lot No. LRB No. Used Action  NAIL FEM RETRO 10.5X340 - [id number] Nail NAIL FEM RETRO 10.5X340  [employer](ORTH,TRAU,BIO,SG) [id number] Right 1 Implanted  SCREW CORT TI DBLE LEAD 5X54 - [id number] Screw SCREW CORT TI DBLE LEAD 5X54  [employer](ORTH,TRAU,BIO,SG) [id number] Right 1 Implanted  SCREW CORT TI DBL LEAD 5X75 - [id number] Screw SCREW CORT TI DBL LEAD 5X75  [employer](ORTH,TRAU,BIO,SG) [id number] Right 1 Implanted  SCREW CORT TI DBL LEAD 5X70 - [id number] Screw SCREW CORT TI DBL LEAD 5X70  [employer](ORTH,TRAU,BIO,SG) [id number] Right 1 Implanted  SCREW CORT TI DBL LEAD 5X60 - [id number] Screw SCREW CORT TI DBL LEAD 5X60  [employer](ORTH,TRAU,BIO,SG) [id number] Right 1 Implanted  SCREW CORT TI DBL LEAD 5X36 - [id number] Screw SCREW CORT TI DBL LEAD 5X36  [employer](ORTH,TRAU,BIO,SG) [id number] Right 1 Implanted  SCREW CORT TI DBL LEAD 5X32 - [id number] Screw SCREW CORT TI DBL LEAD 5X32  [employer](ORTH,TRAU,BIO,SG) [id number] Right 1 Implanted     Indications for Surgery: 79 year old female with a ground-level fall sustained a right midshaft femur fracture.  Due to the unstable nature of her injury I recommended proceeding with intramedullary nailing of her right femur.  Risks and benefits were discussed with the patient.  Risks included but not limited to bleeding, infection, malunion,  nonunion, hardware failure, hardware irritation, nerve and blood vessel injury, knee stiffness, knee pain, DVT, even possibility anesthetic complications.  Patient agreed to proceed with surgery and consent was obtained.  Operative Findings: Retrograde intramedullary nailing of right femoral shaft fracture using [employer] [place] 10.5 x 340 mm nail  Procedure: The patient was identified in the preoperative holding area. Consent was confirmed with the patient and their family and all questions were answered. The operative extremity was marked after confirmation with the patient. she was then brought back to the operating room by our anesthesia colleagues.  She was placed under spinal anesthesia.  She was then carefully transferred over to a radiolucent flat top table.  A bump was placed under her operative hip.  The right lower extremity was prepped and draped in usual sterile fashion.  A timeout was performed to verify the patient, the procedure, and the extremity.  Preoperative antibiotics were dosed.  Fluoroscopic imaging was obtained to show the unstable nature of her injury.  A small medial parapatellar incision was carried through skin subcutaneous tissue and the retinaculum was split in line with the incision just medial to the patellar tendon.  The joint was entered with a curved Mayo.  I then used a threaded guidewire into find the appropriate starting point on the AP and lateral fluoroscopic imaging.  The guidewire was directed into the metaphysis and then an entry reamer was used to enter the canal.  A ball-tipped guidewire was then passed down the center canal.  A reduction maneuver was performed and the ball-tipped guidewire was passed into the proximal segment.  I measured the length of the nail  and chose a 340 mm nail.  I then sequentially reamed from 8.48mm to 12 mm to pass a 10.5 mm nail.  I passed the nail and unfortunately there was a slight amount of translation and valgus  malalignment.  I backed out the nail and then proceeded to place a drill bit along the lateral cortex of the distal segment to correct the alignment and translation.  I then rereamed and then passed the nail once more and the alignment was much improved.  I then proceeded to place for distal interlocking screws using the jig.  The screws were then locked to the nail with the [employer].  The drill bit was removed and the alignment was maintained.  I then used perfect circle technique to place 2 anterior to posterior interlocking screws in the proximal segment.  I then performed a final fluoroscopic imaging.  The incisions were copiously irrigated.  A gram of vancomycin powder was placed into the incisions.  A layer closure of 0 Vicryl, 2-0 Vicryl and 3-0 Monocryl was used.  Dermabond was used to seal the skin.  Sterile dressings were placed and the patient was woken from anesthesia and taken to the PACU in stable condition.  Post Op Plan/Instructions: Patient will be weightbearing as tolerated to the right lower extremity.  She will receive postoperative Ancef.  She will receive Lovenox for DVT prophylaxis.  We will have her mobilize with physical and Occupational Therapy.  I was present and performed the entire surgery.  Patrecia Pace, PA-C did assist me throughout the case. An assistant was necessary given the difficulty in approach, maintenance of reduction and ability to instrument the fracture.   Katha Hamming, MD [facility]

## 2019-05-11 NOTE — Anesthesia Preprocedure Evaluation (Signed)
Anesthesia Evaluation  Patient identified by MRN, date of birth, ID band Patient awake    Reviewed: Allergy & Precautions, NPO status , Patient's Chart, lab work & pertinent test results  Airway Mallampati: II  TM Distance: >3 FB     Dental  (+) Edentulous Upper, Edentulous Lower   Pulmonary former smoker,  Former smoker, quit 2015- 3 pack year history    Pulmonary exam normal        Cardiovascular hypertension, Normal cardiovascular exam+ dysrhythmias Atrial Fibrillation   afib 2011 postoperatively- spontaneous conversion to NSR  nml echo 2011   Neuro/Psych PSYCHIATRIC DISORDERS Anxiety    GI/Hepatic negative GI ROS, Neg liver ROS,   Endo/Other  Hyperthyroidism   Renal/GU negative Renal ROS  negative genitourinary   Musculoskeletal Right femur fx  Hx T12 kyphoplasty    Abdominal Normal abdominal exam  (+)   Peds  Hematology  (+) Blood dyscrasia, anemia , hct 31.8, plt 154   Anesthesia Other Findings   Reproductive/Obstetrics negative OB ROS                             Anesthesia Physical  Anesthesia Plan  ASA: III  Anesthesia Plan: Spinal   Post-op Pain Management:    Induction:   PONV Risk Score and Plan: 2 and Propofol infusion and TIVA  Airway Management Planned: Natural Airway and Nasal Cannula  Additional Equipment: None  Intra-op Plan:   Post-operative Plan:   Informed Consent: I have reviewed the patients History and Physical, chart, labs and discussed the procedure including the risks, benefits and alternatives for the proposed anesthesia with the patient or authorized representative who has indicated his/her understanding and acceptance.       Plan Discussed with: CRNA  Anesthesia Plan Comments:         Anesthesia Quick Evaluation

## 2019-05-11 NOTE — Anesthesia Postprocedure Evaluation (Signed)
Anesthesia Post Note  Patient: Courtney Arnold  Procedure(s) Performed: RETROGRADE INTRAMEDULLARY NAIL FEMORAL (Right Leg Upper)     Patient location during evaluation: PACU Anesthesia Type: Spinal Level of consciousness: awake and alert and oriented Pain management: pain level controlled Vital Signs Assessment: post-procedure vital signs reviewed and stable Respiratory status: spontaneous breathing, nonlabored ventilation and respiratory function stable Cardiovascular status: blood pressure returned to baseline and stable Postop Assessment: no headache, no backache, spinal receding and patient able to bend at knees Anesthetic complications: no    Last Vitals:  Vitals:   05/11/19 0403 05/11/19 1200  BP: (!) 115/96 (!) 95/57  Pulse: 99 88  Resp: 16 16  Temp: 37.1 C 36.5 C  SpO2:  95%    Last Pain:  Vitals:   05/11/19 1200  TempSrc:   PainSc: 0-No pain                 Jarome Matin Jessicalynn Deshong

## 2019-05-11 NOTE — Anesthesia Procedure Notes (Signed)
Procedure Name: MAC Date/Time: 05/11/2019 10:03 AM Performed by: Griffin Dakin, CRNA Pre-anesthesia Checklist: Patient identified, Emergency Drugs available, Suction available, Patient being monitored and Timeout performed Oxygen Delivery Method: Simple face mask Induction Type: IV induction Ventilation: Oral airway inserted - appropriate to patient size Dental Injury: Teeth and Oropharynx as per pre-operative assessment

## 2019-05-11 NOTE — Plan of Care (Signed)

## 2019-05-11 NOTE — Transfer of Care (Signed)
Immediate Anesthesia Transfer of Care Note  Patient: Courtney Arnold  Procedure(s) Performed: RETROGRADE INTRAMEDULLARY NAIL FEMORAL (Right Leg Upper)  Patient Location: PACU  Anesthesia Type:Spinal  Level of Consciousness: awake and patient cooperative  Airway & Oxygen Therapy: Patient Spontanous Breathing and Patient connected to face mask oxygen  Post-op Assessment: Report given to RN and Post -op Vital signs reviewed and stable  Post vital signs: Reviewed and stable  Last Vitals:  Vitals Value Taken Time  BP 70/27 05/11/19 1157  Temp    Pulse 110 05/11/19 1158  Resp 35 05/11/19 1158  SpO2 77 % 05/11/19 1158  Vitals shown include unvalidated device data.  Last Pain:  Vitals:   05/11/19 0458  TempSrc:   PainSc: Asleep         Complications: No apparent anesthesia complications

## 2019-05-11 NOTE — Consult Note (Signed)
Medical Consultation   Courtney Arnold  TZG:[id number]  DOB: [birth year]  DOA: 05/10/2019  PCP: [name], MD    Requesting physician: Dr Rod Can orthopedic surgery  Reason for consultation: Medical clearance   History of Present Illness: Courtney Arnold is an 79 y.o. female WF PMHx atrial fibrillation, compression fracture T12 s/p kyphoplasty, HTN, Anemia, Hypothyroidism, Anxiety.  Is being evaluated for lower back pain, bilateral lower extremity weakness, what patient describes as legs giving out causing her to fall.  Patient was due to have a PET scan next week for further evaluation by Dr. Ellene Route neurosurgery.  3/5 Sleepy but arousable.  Currently pain beginning to rise page RN for as needed pain medication.  Negative CP, negative S OB   Review of Systems:  Review of Systems  Constitutional: Negative.   HENT: Negative.   Eyes: Negative.   Respiratory: Negative.   Cardiovascular: Negative.   Gastrointestinal: Negative.   Genitourinary: Negative.   Musculoskeletal: Positive for back pain, falls and joint pain. Negative for myalgias and neck pain.  Skin: Negative.   Neurological: Positive for dizziness, tingling, tremors, sensory change, speech change, seizures, loss of consciousness and headaches. Negative for focal weakness and weakness.  Endo/Heme/Allergies: Negative.   Psychiatric/Behavioral: Negative.     Past Medical History: Past Medical History:  Diagnosis Date  . Atrial fibrillation ([place]) 2011   Postop, spontaneous conversion to normal sinus after one hour  . Compression fracture 07/24/09   T12; kyphoplasty  . History of echocardiogram 5/11   EF 65%  . Hypertension   . Thyroid disease   . Tobacco abuse     Past Surgical History: Past Surgical History:  Procedure Laterality Date  . BACK SURGERY    . BACK SURGERY  06/06/2015  . BREAST EXCISIONAL BIOPSY Left    50 years ago  benign  . CHOLECYSTECTOMY N/A 04/25/2015   Procedure:  LAPAROSCOPIC CHOLECYSTECTOMY WITH INTRAOPERATIVE CHOLANGIOGRAM;  Surgeon: Mickeal Skinner, MD;  Location: [facility];  Service: General;  Laterality: N/A;  . COLONOSCOPY N/A 11/26/2015   Procedure: COLONOSCOPY;  Surgeon: Daneil Dolin, MD;  Location: [facility];  Service: Endoscopy;  Laterality: N/A;  7:30 am  . ERCP N/A 04/14/2015   Procedure: ENDOSCOPIC RETROGRADE CHOLANGIOPANCREATOGRAPHY (ERCP) Biliary Sphincterotomy, 10x7 stent placement Dilated bilary system just not well seen;  Surgeon: Rogene Houston, MD;  Location: [facility];  Service: Endoscopy;  Laterality: N/A;  . ERCP N/A 06/12/2015   Procedure: ENDOSCOPIC RETROGRADE CHOLANGIOPANCREATOGRAPHY (ERCP);  Surgeon: Rogene Houston, MD;  Location: [facility];  Service: Endoscopy;  Laterality: N/A;  . ESOPHAGOGASTRODUODENOSCOPY N/A 06/12/2015   Procedure: DIAGNOSTIC ESOPHAGOGASTRODUODENOSCOPY (EGD);  Surgeon: Rogene Houston, MD;  Location: [facility];  Service: Endoscopy;  Laterality: N/A;  . STENT REMOVAL  06/12/2015   Procedure: STENT REMOVAL ;  Surgeon: Rogene Houston, MD;  Location: [facility];  Service: Endoscopy;;     Allergies:  No Known Allergies   Social History:  reports that she quit smoking about 6 years ago. She has a 3.00 pack-year smoking history. She has never used smokeless tobacco. She reports that she does not drink alcohol or use drugs.   Family History: Family History  Problem Relation Age of Onset  . Heart attack Father   . Stroke Father   . Breast cancer Sister   . Thyroid disease Neg Hx   . Colon cancer  Neg Hx       Procedures/Significant Events:  2/23 CT renal stone study; Expansile lytic lesions are present within the sacrum including a 4.2 cm lesion in the right sacral ala which extends into the right SI joint and a smaller infiltrating lytic focus in the left sacral ala measuring up to 2.4 cm (5/63) and a separate focus more posteriorly measuring 1.9 cm (5/69, heterogeneous marrow elsewhere in  the sacrum is conspicuous as well and worrisome for additional site of osseous involvement. -Stable appearance of an L2 superior endplate compression deformity prior T12 compression deformity with post vertebroplasty changes. -Biliary ductal dilatation, greater than expected for age and reservoir effect. Consider liver serologies and if elevated MRCP could be obtained. 3/4 CXR;New compression fracture of T10 since 06/11/2015. No acute cardiopulmonary abnormalities. 3/4 DG femur RIGHT;Displaced spiral fracture of the distal femoral diaphysis with valgus angulation and external rotation of the lower fracture fragment    I have personally reviewed and interpreted all radiology studies and my findings are as above.     Continuous Infusions: . sodium chloride 1,000 mL (05/10/19 1919)  .  ceFAZolin (ANCEF) IV    . methocarbamol (ROBAXIN) IV       Physical Exam: Vitals:   05/10/19 2144 05/10/19 2345 05/10/19 2359 05/11/19 0403  BP: (!) 133/58 (!) 127/54 (!) 127/54 (!) 115/96  Pulse: (!) 102 (!) 101 (!) 101 99  Resp: 20 16 18 16   Temp: 97.7 F (36.5 C) 98.9 F (37.2 C) 98.9 F (37.2 C) 98.8 F (37.1 C)  TempSrc: Oral Oral Oral Oral  SpO2: 100% 97% 97%     General: A/O x4, no acute respiratory distress Eyes: negative scleral hemorrhage, negative anisocoria, negative icterus ENT: Negative Runny nose, negative gingival bleeding, Neck:  Negative scars, masses, torticollis, lymphadenopathy, JVD Lungs: Clear to auscultation bilaterally without wheezes or crackles Cardiovascular: Regular rate and rhythm without murmur gallop or rub normal S1 and S2 Abdomen: negative abdominal pain, nondistended, positive soft, bowel sounds, no rebound, no ascites, no appreciable mass Extremities: Positive swelling of RIGHT thigh, painful to palpation, external rotation of lower extremity.  Positive DP/PT pulse Skin: Negative rashes, lesions, ulcers Psychiatric:  Negative depression, negative anxiety,  negative fatigue, negative mania  Central nervous system:  Cranial nerves II through XII intact, tongue/uvula midline, all extremities muscle strength 5/5, sensation intact throughout, negative dysarthria, negative expressive aphasia, negative receptive aphasia.  Data reviewed:  I have personally reviewed following labs and imaging studies Labs:  CBC: Recent Labs  Lab 05/08/19 1421 05/10/19 1459 05/11/19 0503  WBC 4.8 6.2 4.1  NEUTROABS 2.7 4.8 2.5  HGB 8.5* 6.9* 10.4*  HCT 27.8* 21.9* 31.8*  MCV 90.8 88.3 86.6  PLT 184 218 867    Basic Metabolic Panel: Recent Labs  Lab 05/08/19 1421 05/08/19 1421 05/10/19 1459 05/11/19 0503  NA 135  --  135 136  K 3.9   < > 3.7 3.8  CL 98  --  96* 99  CO2 29  --  29 29  GLUCOSE 90  --  145* 104*  BUN 27*  --  26* 21  CREATININE 0.74  --  0.62 0.68  CALCIUM 8.8*  --  8.5* 8.4*  MG  --   --   --  1.9   < > = values in this interval not displayed.   GFR Estimated Creatinine Clearance: 46.6 mL/min (by C-G formula based on SCr of 0.68 mg/dL). Liver Function Tests: Recent Labs  Lab 05/08/19 1421  05/11/19 0503  AST 16 15  ALT 16 11  ALKPHOS 83 67  BILITOT 0.5 0.5  PROT 6.7 5.1*  ALBUMIN 3.6 2.6*   No results for input(s): LIPASE, AMYLASE in the last 168 hours. No results for input(s): AMMONIA in the last 168 hours. Coagulation profile Recent Labs  Lab 05/10/19 1459 05/11/19 0503  INR 1.0 1.0    Cardiac Enzymes: Recent Labs  Lab 05/10/19 1459  CKTOTAL 109   BNP: Invalid input(s): POCBNP CBG: No results for input(s): GLUCAP in the last 168 hours. D-Dimer No results for input(s): DDIMER in the last 72 hours. Hgb A1c No results for input(s): HGBA1C in the last 72 hours. Lipid Profile No results for input(s): CHOL, HDL, LDLCALC, TRIG, CHOLHDL, LDLDIRECT in the last 72 hours. Thyroid function studies Recent Labs    05/10/19 1622  TSH 3.046   Anemia work up Recent Labs    05/08/19 1421  VITAMINB12 118*    FOLATE 12.7  FERRITIN 64  TIBC 349  IRON 31  RETICCTPCT 1.4   Urinalysis    Component Value Date/Time   COLORURINE YELLOW 05/01/2019 0424   APPEARANCEUR HAZY (A) 05/01/2019 0424   LABSPEC 1.016 05/01/2019 0424   PHURINE 5.0 05/01/2019 0424   GLUCOSEU NEGATIVE 05/01/2019 0424   HGBUR MODERATE (A) 05/01/2019 0424   BILIRUBINUR NEGATIVE 05/01/2019 0424   KETONESUR NEGATIVE 05/01/2019 0424   PROTEINUR NEGATIVE 05/01/2019 0424   UROBILINOGEN 0.2 10/02/2008 0830   NITRITE NEGATIVE 05/01/2019 0424   LEUKOCYTESUR TRACE (A) 05/01/2019 0424     Microbiology Recent Results (from the past 240 hour(s))  Respiratory Panel by RT PCR (Flu A&B, Covid) - Nasopharyngeal Swab     Status: None   Collection Time: 05/10/19  3:50 PM   Specimen: Nasopharyngeal Swab  Result Value Ref Range Status   SARS Coronavirus 2 by RT PCR NEGATIVE NEGATIVE Final    Comment: (NOTE) SARS-CoV-2 target nucleic acids are NOT DETECTED. The SARS-CoV-2 RNA is generally detectable in upper respiratoy specimens during the acute phase of infection. The lowest concentration of SARS-CoV-2 viral copies this assay can detect is 131 copies/mL. A negative result does not preclude SARS-Cov-2 infection and should not be used as the sole basis for treatment or other patient management decisions. A negative result may occur with  improper specimen collection/handling, submission of specimen other than nasopharyngeal swab, presence of viral mutation(s) within the areas targeted by this assay, and inadequate number of viral copies (<131 copies/mL). A negative result must be combined with clinical observations, patient history, and epidemiological information. The expected result is Negative. Fact Sheet for Patients:  PinkCheek.be Fact Sheet for Healthcare Providers:  GravelBags.it This test is not yet ap proved or cleared by the [place] FDA and  has been  authorized for detection and/or diagnosis of SARS-CoV-2 by FDA under an Emergency Use Authorization (EUA). This EUA will remain  in effect (meaning this test can be used) for the duration of the COVID-19 declaration under Section 564(b)(1) of the Act, 21 U.S.C. section 360bbb-3(b)(1), unless the authorization is terminated or revoked sooner.    Influenza A by PCR NEGATIVE NEGATIVE Final   Influenza B by PCR NEGATIVE NEGATIVE Final    Comment: (NOTE) The Xpert Xpress SARS-CoV-2/FLU/RSV assay is intended as an aid in  the diagnosis of influenza from Nasopharyngeal swab specimens and  should not be used as a sole basis for treatment. Nasal washings and  aspirates are unacceptable for Xpert Xpress SARS-CoV-2/FLU/RSV  testing. Fact Sheet  for Patients: PinkCheek.be Fact Sheet for Healthcare Providers: GravelBags.it This test is not yet approved or cleared by the [place] FDA and  has been authorized for detection and/or diagnosis of SARS-CoV-2 by  FDA under an Emergency Use Authorization (EUA). This EUA will remain  in effect (meaning this test can be used) for the duration of the  Covid-19 declaration under Section 564(b)(1) of the Act, 21  U.S.C. section 360bbb-3(b)(1), unless the authorization is  terminated or revoked. Performed at [facility], [place] [address], [place], [place] [address]   MRSA PCR Screening     Status: None   Collection Time: 05/10/19  9:57 PM   Specimen: Nasal Mucosa; Nasopharyngeal  Result Value Ref Range Status   MRSA by PCR NEGATIVE NEGATIVE Final    Comment:        The GeneXpert MRSA Assay (FDA approved for NASAL specimens only), is one component of a comprehensive MRSA colonization surveillance program. It is not intended to diagnose MRSA infection nor to guide or monitor treatment for MRSA infections. Performed at [facility], [place] [address] [place], [place] [address]         Inpatient Medications:   Scheduled Meds: . ALPRAZolam  0.5 mg Oral QHS  . Chlorhexidine Gluconate Cloth  6 each Topical Daily  . enoxaparin (LOVENOX) injection  40 mg Subcutaneous Q24H  . feeding supplement  296 mL Oral Once  . hydroxypropyl methylcellulose / hypromellose  1 drop Both Eyes QID  . levothyroxine  100 mcg Oral Q0600  . povidone-iodine  2 application Topical Once   Continuous Infusions: . sodium chloride 1,000 mL (05/10/19 1919)  .  ceFAZolin (ANCEF) IV    . methocarbamol (ROBAXIN) IV       Radiological Exams on Admission: DG Chest 1 View  Result Date: 05/10/2019 CLINICAL DATA:  Trauma secondary to a fall in the shower this morning. EXAM: CHEST  1 VIEW COMPARISON:  06/11/2015 FINDINGS: Heart size and pulmonary vascularity are normal. Lungs are clear. No effusions. Multiple old thoracic compression fractures some of which have been treated with vertebroplasty. There is a new compression fracture of T10 since 06/11/2015. IMPRESSION: New compression fracture of T10 since 06/11/2015. No acute cardiopulmonary abnormalities. Electronically Signed   By: Lorriane Shire M.D.   On: 05/10/2019 14:50   DG Knee 1-2 Views Right  Result Date: 05/10/2019 CLINICAL DATA:  Fall EXAM: RIGHT KNEE - 1-2 VIEW COMPARISON:  Concurrent femur radiograph. FINDINGS: Displaced distal femoral diaphysis fracture better assessed on dedicated femur radiographs performed concurrently. Mild tricompartmental degenerative changes of the knee. No other acute fracture or traumatic malalignment is seen. Small effusion is present. IMPRESSION: Displaced distal femoral fracture better assessed on dedicated femur radiographs performed concurrently. Trace knee effusion.  No additional fractures. Electronically Signed   By: Lovena Le M.D.   On: 05/10/2019 15:43   CT FEMUR RIGHT WO CONTRAST  Result Date: 05/10/2019 CLINICAL DATA:  79 year old female with trauma to the right lower extremity. EXAM: CT OF THE  LOWER RIGHT EXTREMITY WITHOUT CONTRAST TECHNIQUE: Multidetector CT imaging of the right lower extremity was performed according to the standard protocol. COMPARISON:  Right lower extremity radiograph dated 05/10/2019. FINDINGS: Bones/Joint/Cartilage There is a displaced spiral fracture of the distal third of the right femoral diaphysis. There is external rotation and valgus angulation of the distal fracture fragment. The bones are osteopenic. There is no dislocation. There is a small suprapatellar effusion. Partially visualized lucent area in the lateral aspect  of the right sacral bone adjacent to the sacroiliac joint (series 3, image 1) noted which is not characterized and appears new since the study of 06/11/2015. This may be represent degenerative changes and osteopenia although a lytic lesion is not excluded. Ligaments Suboptimally assessed by CT. Muscles and Tendons No intramuscular hematoma or large fluid collection. Soft tissues Sigmoid diverticulosis. There is stranding and edema in the deep fascial of the right thigh as well as edema of the subcutaneous soft tissues of the distal thigh and knee. IMPRESSION: 1. Displaced spiral fracture of the distal third of the right femoral diaphysis with external rotation and valgus angulation of the distal fracture fragment. No dislocation. 2. Small suprapatellar effusion. 3. Partially visualized lucent area in the lateral aspect of the right sacral bone adjacent to the SI joint. This may represent degenerative changes and osteopenia although a lytic lesion is not excluded. MRI may provide better evaluation on a nonemergent/outpatient basis. Electronically Signed   By: Anner Crete M.D.   On: 05/10/2019 21:09   DG Hip Unilat With Pelvis 2-3 Views Right  Result Date: 05/10/2019 CLINICAL DATA:  Right hip pain since a fall in the shower this morning. EXAM: DG HIP (WITH OR WITHOUT PELVIS) 2-3V RIGHT COMPARISON:  Radiographs dated 03/26/2019 FINDINGS: There is no  evidence of hip fracture or dislocation. There is no evidence of arthropathy or other focal bone abnormality. IMPRESSION: Negative. Electronically Signed   By: Lorriane Shire M.D.   On: 05/10/2019 14:51   DG Femur Min 2 Views Right  Result Date: 05/10/2019 CLINICAL DATA:  Fall EXAM: RIGHT FEMUR 2 VIEWS COMPARISON:  Distal femur and right knee pain FINDINGS: Displaced spiral fracture of the distal femoral diaphysis with valgus angulation and external rotation of the lower fracture fragment. No proximal femoral fracture is seen. Femoral head remains normally located. Moderate degenerative changes at the hip. No additional fractures seen distal to the diaphyseal fracture site. Dedicated knee radiographs were performed concurrently. Extensive overlying soft tissue swelling without soft tissue gas to suggest radiographic evidence of compound fracture. IMPRESSION: Displaced spiral fracture of the distal femoral diaphysis with valgus angulation and external rotation of the lower fracture fragment. Electronically Signed   By: Lovena Le M.D.   On: 05/10/2019 15:42    Impression/Recommendations Active Problems:   TOBACCO ABUSE   Compression fracture of L2 lumbar vertebra (HCC)   Closed fracture of right femur (HCC)   Hypothyroidism   Unspecified atrial fibrillation ([facility]CC)   Anxiety   Anemia, unspecified   Sacral lytic lesions  Closed fracture RIGHT femur -Repair per orthopedic surgery. -Most likely site of significant loss of blood.  Per patient and daughter no history of anemia, and colonoscopy 2018 was clear.  Negative history of gastric ulcers. -Although patient has some symptoms that could be consistent with multiple myeloma patient should be low risk for surgery, given negative cardiac history (previous past history of A. fib not treated), or respiratory history.  Compression fracture L2 lumbar vertebrae/Sacral lytic lesions -Patient has multiple sacral lytic lesions and new L2 lumbar vertebrae  fracture, which may have been the cause/contributed to patient's initial fall.  Patient was seeing Dr. Ellene Route neurosurgery for work-up.  If patient is to be in hospital for several days post femur repair recommend consulting Dr. Ellene Route -Lesions could be consistent with multiple myeloma -UPEP pending -SPEP pending -Kappa/lambda free light chain pending  Unspecified atrial fibrillation -Currently NSR -Not on home medication for A. fib. -Would monitor closely given stress of  current situation though patient has not been in A. fib may cause her to lapse into atrial fibrillation. -Transfuse for hemoglobin<7  Anemia -See closed fracture -Occult blood  Hypothyroidism -Synthroid 100 mcg daily  Anxiety -Xanax 0.5 mg qhs    Thank you for this consultation.  Our [facility] hospitalist team will follow the patient with you.   Time Spent: 60-minute  Camyla Camposano, Geraldo Docker M.D. [facility] 05/11/2019, 8:18 AM  [phone]

## 2019-05-11 NOTE — Anesthesia Procedure Notes (Signed)
Spinal  Patient location during procedure: OR Start time: 05/11/2019 10:00 AM End time: 05/11/2019 10:10 AM Preanesthetic Checklist Completed: patient identified, IV checked, site marked, risks and benefits discussed, surgical consent, monitors and equipment checked, pre-op evaluation and timeout performed Spinal Block Patient position: sitting Prep: DuraPrep Patient monitoring: heart rate, cardiac monitor, continuous pulse ox and blood pressure Approach: midline Location: L3-4 Injection technique: single-shot Needle Needle type: Sprotte  Needle gauge: 24 G Needle length: 9 cm Assessment Sensory level: T4

## 2019-05-12 LAB — COMPREHENSIVE METABOLIC PANEL
ALT: 9 U/L (ref 0–44)
AST: 14 U/L — ABNORMAL LOW (ref 15–41)
Albumin: 2.3 g/dL — ABNORMAL LOW (ref 3.5–5.0)
Alkaline Phosphatase: 47 U/L (ref 38–126)
Anion gap: 7 (ref 5–15)
BUN: 18 mg/dL (ref 8–23)
CO2: 28 mmol/L (ref 22–32)
Calcium: 8 mg/dL — ABNORMAL LOW (ref 8.9–10.3)
Chloride: 101 mmol/L (ref 98–111)
Creatinine, Ser: 0.7 mg/dL (ref 0.44–1.00)
GFR calc Af Amer: >60 mL/min (ref >60)
GFR calc non Af Amer: >60 mL/min (ref >60)
Glucose, Bld: 93 mg/dL (ref 70–99)
Potassium: 3.7 mmol/L (ref 3.5–5.1)
Sodium: 136 mmol/L (ref 135–145)
Total Bilirubin: 0.4 mg/dL (ref 0.3–1.2)
Total Protein: 4.3 g/dL — ABNORMAL LOW (ref 6.5–8.1)

## 2019-05-12 LAB — HEMOGLOBIN AND HEMATOCRIT, BLOOD
HCT: 22.3 % — ABNORMAL LOW (ref 36.0–46.0)
HCT: 23.1 % — ABNORMAL LOW (ref 36.0–46.0)
Hemoglobin: 7.1 g/dL — ABNORMAL LOW (ref 12.0–15.0)
Hemoglobin: 7.5 g/dL — ABNORMAL LOW (ref 12.0–15.0)

## 2019-05-12 LAB — MAGNESIUM: Magnesium: 1.7 mg/dL (ref 1.7–2.4)

## 2019-05-12 LAB — VITAMIN D 25 HYDROXY (VIT D DEFICIENCY, FRACTURES): Vit D, 25-Hydroxy: 22.25 ng/mL — ABNORMAL LOW (ref 30–100)

## 2019-05-12 MED ORDER — MAGNESIUM SULFATE 2 GM/50ML IV SOLN
2.0000 g | Freq: Once | INTRAVENOUS | Status: AC
  Administered 2019-05-12: 2 g via INTRAVENOUS
  Filled 2019-05-12: qty 50

## 2019-05-12 NOTE — Progress Notes (Signed)
Occupational Therapy Evaluation Patient Details Name: Courtney Arnold MRN: [id number] DOB: February 28, 1941 Today's Date: 05/12/2019    History of Present Illness Patient is a 79 y/o female presenting to the ED on 05/10/19 after a fall at home with R femur fx. S/p IM nail of R femur on 05/11/19 with patient currently WBAT. Patient with a PMH significant for AFib, T12 kyphoplasty, HTN, recent lytic lesions found on sacrum with continued work-up.    Clinical Impression   Pt presents with above diagnosis. PTA pt PLOF living at home alone with some family support, requiring assistance as needed due to pain. Pt reports living in one level, 3 step entry with AE of RW and open door/step in shower with bench. Pt currently limited with safe ADL engagement due to pain and limited function and will benefit from additional acute OT to address established deficits to maximize independence prior to dc. DC recommendation to SNF to ensure safe transition to home environment.     Follow Up Recommendations  SNF;Supervision/Assistance - 24 hour    Equipment Recommendations  (defer to SNF setting)    Recommendations for Other Services       Precautions / Restrictions Precautions Precautions: Fall Restrictions Weight Bearing Restrictions: Yes RLE Weight Bearing: Weight bearing as tolerated      Mobility Bed Mobility Overal bed mobility: Needs Assistance Bed Mobility: Supine to Sit     Supine to sit: Min assist;Mod assist;+2 for physical assistance     General bed mobility comments: Min-light Mod A +2 to come to EOB with use of bed pad to scoot towards edge; pain increased with knee flexion  Transfers Overall transfer level: Needs assistance Equipment used: Rolling walker (2 wheeled) Transfers: Sit to/from [facility] Sit to Stand: Min assist;Mod assist;+2 physical assistance Stand pivot transfers: Min assist;Mod assist;+2 physical assistance       General transfer comment: +2 for  sit to stand at bedside and [facility] for pivot transfers to [facility] and recliner - use of RW with PT assisting to guide device    Balance Overall balance assessment: Needs assistance Sitting-balance support: Bilateral upper extremity supported;Feet supported Sitting balance-Leahy Scale: Fair     Standing balance support: Bilateral upper extremity supported;During functional activity Standing balance-Leahy Scale: Poor Standing balance comment: reliant on RW                           ADL either performed or assessed with clinical judgement   ADL Overall ADL's : Needs assistance/impaired Eating/Feeding: Independent;Sitting   Grooming: Wash/dry hands;Wash/dry face;Oral care;Set up;Sitting           Upper Body Dressing : Min guard Upper Body Dressing Details (indicate cue type and reason): Pt requires 1 arm to support in sitting due to pain and to maintain balance Lower Body Dressing: Moderate assistance;Sitting/lateral leans Lower Body Dressing Details (indicate cue type and reason): Pt reports mostly bending forward to don/doff for lb dressing will requires AE education for LB dressing and bathing. Toilet Transfer: Minimal assistance;Moderate assistance;+2 for safety/equipment;+2 for physical assistance;Cueing for safety;Cueing for sequencing;Stand-pivot;RW;BSC Toilet Transfer Details (indicate cue type and reason): Min to Mod A +2 for safety for sit to stand at bedside to [facility] wtih stand pivot. Heavy VC's for hand placement and sequencing with RW Toileting- Clothing Manipulation and Hygiene: Maximal assistance Toileting - Clothing Manipulation Details (indicate cue type and reason): due to maintain balance with RW.     Functional mobility during ADLs:  Minimal assistance;Rolling walker;Cueing for safety;Cueing for sequencing General ADL Comments: Pt increases when sitting EOB and knee flexed while in position to perform ADLs.      Vision         Perception     Praxis       Pertinent Vitals/Pain Pain Assessment: Faces Faces Pain Scale: Hurts even more Pain Location: R LE with mobility Pain Descriptors / Indicators: Aching;Discomfort;Grimacing;Guarding Pain Intervention(s): Limited activity within patient's tolerance;Monitored during session;Repositioned     Hand Dominance Right   Extremity/Trunk Assessment Upper Extremity Assessment Upper Extremity Assessment: Generalized weakness   Lower Extremity Assessment Lower Extremity Assessment: Generalized weakness;RLE deficits/detail RLE Deficits / Details: expected post-op pain and weakness       Communication Communication Communication: No difficulties   Cognition Arousal/Alertness: Awake/alert Behavior During Therapy: WFL for tasks assessed/performed Overall Cognitive Status: Within Functional Limits for tasks assessed                                     General Comments  Pt did reports some dizziness initially, with increased prior to transfer dizziness subsided.    Exercises     Shoulder Instructions      Home Living Family/patient expects to be discharged to:: Private residence Living Arrangements: Alone Available Help at Discharge: Family;Friend(s) Type of Home: House Home Access: Stairs to enter [employer] of Steps: 3 Entrance Stairs-Rails: Can reach both Home Layout: One level     Bathroom Shower/Tub: Occupational psychologist: Handicapped height Bathroom Accessibility: Yes   Home Equipment: None          Prior Functioning/Environment Level of Independence: Independent                 OT Problem List: Decreased activity tolerance;Impaired balance (sitting and/or standing);Decreased safety awareness;Decreased knowledge of use of DME or AE;Decreased knowledge of precautions;Pain      OT Treatment/Interventions: Self-care/ADL training;Therapeutic exercise;DME and/or AE instruction;Therapeutic activities;Patient/family  education;Balance training    OT Goals(Current goals can be found in the care plan section) Acute Rehab OT Goals Patient Stated Goal: reduce pain; go home OT Goal Formulation: With patient Time For Goal Achievement: 05/26/19 Potential to Achieve Goals: Fair  OT Frequency: Min 2X/week   Barriers to D/C: Decreased caregiver support          Co-evaluation   Reason for Co-Treatment: Complexity of the patient's impairments (multi-system involvement);Necessary to address cognition/behavior during functional activity;For patient/therapist safety;To address functional/ADL transfers PT goals addressed during session: Mobility/safety with mobility;Balance;Proper use of DME        AM-PAC OT "6 Clicks" Daily Activity     Outcome Measure Help from another person eating meals?: None Help from another person taking care of personal grooming?: A Little Help from another person toileting, which includes using toliet, bedpan, or urinal?: A Lot Help from another person bathing (including washing, rinsing, drying)?: A Lot Help from another person to put on and taking off regular upper body clothing?: A Little Help from another person to put on and taking off regular lower body clothing?: A Lot 6 Click Score: 16   End of Session Equipment Utilized During Treatment: Gait belt;Rolling walker  Activity Tolerance: Patient limited by pain Patient left: in chair;with call bell/phone within reach;with chair alarm set  OT Visit Diagnosis: Muscle weakness (generalized) (M62.81);History of falling (Z91.81);Pain;Unsteadiness on feet (R26.81) Pain - Right/Left: Right Pain - part of body:  Leg                Time: [id number] OT Time Calculation (min): 27 min Charges:  OT General Charges $OT Visit: 1 Visit OT Evaluation $OT Eval Low Complexity: [place], MSOT, OTR/L  Supplemental Rehabilitation Services  [phone]   Marius Ditch 05/12/2019, 2:12 PM

## 2019-05-12 NOTE — Progress Notes (Signed)
   ORTHOPAEDIC PROGRESS NOTE  s/p Procedure(s): RETROGRADE INTRAMEDULLARY NAIL FEMORAL for Right femoral shaft fracture by Dr. Doreatha Martin on 05/11/2019  SUBJECTIVE: Patient reports some pain about operative site. No chest pain. No SOB. No nausea/vomiting. No other complaints.  OBJECTIVE: PE: Right lower extremity: incision CDI, leg lengths equal, intact EHL/TA/GSC, warm well perfused foot  Vitals:   05/12/19 0600 05/12/19 0915  BP: (!) 92/51 (!) 108/50  Pulse:  92  Resp:    Temp:  (!) 97.5 F (36.4 C)  SpO2:  96%     ASSESSMENT: Courtney Arnold is a 79 y.o. female status post above. POD#1.   PLAN: Weightbearing: WBAT RLE Insicional and dressing care: Reinforce dressings as needed Plan for dressing change tomorrow, then will leave incisions open to air.  Orthopedic device(s): None VTE prophylaxis: Lovenox 40mg  qd once Hgb stable. Hgb currently 7.1 (Lovenox held). SCDs. Ambulation.  Acute blood loss anemia: Hgb 7.1. Per medicine team, hold transfusing until Hgb <7. Will continue to monitor closely.  Pain control: PRN medications, preferring oral medications. Minimize narcotics as able Follow - up plan: 2 weeks in office with Dr. Doreatha Martin Dispo: pending PT/OT eval  Noemi Chapel, PA-C 05/12/2019

## 2019-05-12 NOTE — Plan of Care (Signed)
  Problem: Skin Integrity: Goal: Risk for impaired skin integrity will decrease Outcome: Progressing   Problem: Safety: Goal: Ability to remain free from injury will improve Outcome: Progressing   Problem: Pain Managment: Goal: General experience of comfort will improve Outcome: Progressing   Problem: Elimination: Goal: Will not experience complications related to bowel motility Outcome: Progressing   Problem: Clinical Measurements: Goal: Respiratory complications will improve Outcome: Progressing   Problem: Clinical Measurements: Goal: Ability to maintain clinical measurements within normal limits will improve Outcome: Progressing   Problem: Education: Goal: Knowledge of General Education information will improve Description: Including pain rating scale, medication(s)/side effects and non-pharmacologic comfort measures Outcome: Progressing

## 2019-05-12 NOTE — Progress Notes (Signed)
24 urine collect started: 05/12/19 at 06:00am.  Instructions for 24 hour urine collect posted in pt room on bathroom door. Day Shift Nurse made aware and updated.

## 2019-05-12 NOTE — Consult Note (Signed)
Medical Consultation   Courtney Arnold  GYI:[id number]  DOB: [birth year]  DOA: 05/10/2019  PCP: [name], MD    Requesting physician: Dr Rod Can orthopedic surgery  Reason for consultation: Medical clearance   History of Present Illness: Courtney Arnold is an 79 y.o. female WF PMHx atrial fibrillation, compression fracture T12 s/p kyphoplasty, HTN, Anemia, Hypothyroidism, Anxiety.  Is being evaluated for lower back pain, bilateral lower extremity weakness, what patient describes as legs giving out causing her to fall.  Patient was due to have a PET scan next week for further evaluation by Dr. Ellene Route neurosurgery.  3/6 A/O x4 negative CP, negative S OB sitting in chair comfortably, negative right thigh pain   Review of Systems:  Review of Systems  Constitutional: Negative.   HENT: Negative.   Eyes: Negative.   Respiratory: Negative.   Cardiovascular: Negative.   Gastrointestinal: Negative.   Genitourinary: Negative.   Musculoskeletal: Positive for back pain, falls and joint pain. Negative for myalgias and neck pain.  Skin: Negative.   Neurological: Positive for dizziness, tingling, tremors, sensory change, speech change, seizures, loss of consciousness and headaches. Negative for focal weakness and weakness.  Endo/Heme/Allergies: Negative.   Psychiatric/Behavioral: Negative.     Past Medical History: Past Medical History:  Diagnosis Date  . Atrial fibrillation ([place]) 2011   Postop, spontaneous conversion to normal sinus after one hour  . Compression fracture 07/24/09   T12; kyphoplasty  . History of echocardiogram 5/11   EF 65%  . Hypertension   . Thyroid disease   . Tobacco abuse     Past Surgical History: Past Surgical History:  Procedure Laterality Date  . BACK SURGERY    . BACK SURGERY  06/06/2015  . BREAST EXCISIONAL BIOPSY Left    50 years ago  benign  . CHOLECYSTECTOMY N/A 04/25/2015   Procedure: LAPAROSCOPIC CHOLECYSTECTOMY WITH  INTRAOPERATIVE CHOLANGIOGRAM;  Surgeon: Mickeal Skinner, MD;  Location: [facility];  Service: General;  Laterality: N/A;  . COLONOSCOPY N/A 11/26/2015   Procedure: COLONOSCOPY;  Surgeon: Daneil Dolin, MD;  Location: [facility];  Service: Endoscopy;  Laterality: N/A;  7:30 am  . ERCP N/A 04/14/2015   Procedure: ENDOSCOPIC RETROGRADE CHOLANGIOPANCREATOGRAPHY (ERCP) Biliary Sphincterotomy, 10x7 stent placement Dilated bilary system just not well seen;  Surgeon: Rogene Houston, MD;  Location: [facility];  Service: Endoscopy;  Laterality: N/A;  . ERCP N/A 06/12/2015   Procedure: ENDOSCOPIC RETROGRADE CHOLANGIOPANCREATOGRAPHY (ERCP);  Surgeon: Rogene Houston, MD;  Location: [facility];  Service: Endoscopy;  Laterality: N/A;  . ESOPHAGOGASTRODUODENOSCOPY N/A 06/12/2015   Procedure: DIAGNOSTIC ESOPHAGOGASTRODUODENOSCOPY (EGD);  Surgeon: Rogene Houston, MD;  Location: [facility];  Service: Endoscopy;  Laterality: N/A;  . STENT REMOVAL  06/12/2015   Procedure: STENT REMOVAL ;  Surgeon: Rogene Houston, MD;  Location: [facility];  Service: Endoscopy;;     Allergies:  No Known Allergies   Social History:  reports that she quit smoking about 6 years ago. She has a 3.00 pack-year smoking history. She has never used smokeless tobacco. She reports that she does not drink alcohol or use drugs.   Family History: Family History  Problem Relation Age of Onset  . Heart attack Father   . Stroke Father   . Breast cancer Sister   . Thyroid disease Neg Hx   . Colon cancer Neg Hx  Procedures/Significant Events:  2/23 CT renal stone study; Expansile lytic lesions are present within the sacrum including a 4.2 cm lesion in the right sacral ala which extends into the right SI joint and a smaller infiltrating lytic focus in the left sacral ala measuring up to 2.4 cm (5/63) and a separate focus more posteriorly measuring 1.9 cm (5/69, heterogeneous marrow elsewhere in the sacrum is conspicuous as  well and worrisome for additional site of osseous involvement. -Stable appearance of an L2 superior endplate compression deformity prior T12 compression deformity with post vertebroplasty changes. -Biliary ductal dilatation, greater than expected for age and reservoir effect. Consider liver serologies and if elevated MRCP could be obtained. 3/4 CXR;New compression fracture of T10 since 06/11/2015. No acute cardiopulmonary abnormalities. 3/4 DG femur RIGHT;Displaced spiral fracture of the distal femoral diaphysis with valgus angulation and external rotation of the lower fracture fragment    I have personally reviewed and interpreted all radiology studies and my findings are as above.     Continuous Infusions: . sodium chloride 125 mL/hr at 05/12/19 0933  . methocarbamol (ROBAXIN) IV       Physical Exam: Vitals:   05/11/19 2026 05/12/19 0436 05/12/19 0600 05/12/19 0915  BP: (!) 101/52 (!) 85/49 (!) 92/51 (!) 108/50  Pulse: 98 94  92  Resp: 17 18    Temp: 98.1 F (36.7 C) 98.5 F (36.9 C)  (!) 97.5 F (36.4 C)  TempSrc: Oral Oral  Oral  SpO2: 96% 96%  96%  Weight:      Height:       Physical Exam:  General: A/O x4, no acute respiratory distress Eyes: negative scleral hemorrhage, negative anisocoria, negative icterus ENT: Negative Runny nose, negative gingival bleeding, Neck:  Negative scars, masses, torticollis, lymphadenopathy, JVD Lungs: Clear to auscultation bilaterally without wheezes or crackles Cardiovascular: Regular rate and rhythm without murmur gallop or rub normal S1 and S2 Abdomen: negative abdominal pain, nondistended, positive soft, bowel sounds, no rebound, no ascites, no appreciable mass Extremities: Positive swelling of RIGHT thigh (appropriate for recent surgery) Skin: Negative rashes, lesions, ulcers Psychiatric:  Negative depression, negative anxiety, negative fatigue, negative mania  Central nervous system:  Cranial nerves II through XII intact,  tongue/uvula midline, all extremities muscle strength 5/5, sensation intact throughout, negative dysarthria, negative expressive aphasia, negative receptive aphasia.   Data reviewed:  I have personally reviewed following labs and imaging studies Labs:  CBC: Recent Labs  Lab 05/08/19 1421 05/08/19 1421 05/10/19 1459 05/11/19 0503 05/11/19 2132 05/12/19 0452 05/12/19 1224  WBC 4.8  --  6.2 4.1  --   --   --   NEUTROABS 2.7  --  4.8 2.5  --   --   --   HGB 8.5*   < > 6.9* 10.4* 7.4* 7.1* 7.5*  HCT 27.8*   < > 21.9* 31.8* 23.1* 22.3* 23.1*  MCV 90.8  --  88.3 86.6  --   --   --   PLT 184  --  218 154  --   --   --    < > = values in this interval not displayed.    Basic Metabolic Panel: Recent Labs  Lab 05/08/19 1421 05/08/19 1421 05/10/19 1459 05/10/19 1459 05/11/19 0503 05/12/19 0602  NA 135  --  135  --  136 136  K 3.9   < > 3.7   < > 3.8 3.7  CL 98  --  96*  --  99 101  CO2 29  --  29  --  29 28  GLUCOSE 90  --  145*  --  104* 93  BUN 27*  --  26*  --  21 18  CREATININE 0.74  --  0.62  --  0.68 0.70  CALCIUM 8.8*  --  8.5*  --  8.4* 8.0*  MG  --   --   --   --  1.9 1.7   < > = values in this interval not displayed.   GFR Estimated Creatinine Clearance: 46.6 mL/min (by C-G formula based on SCr of 0.7 mg/dL). Liver Function Tests: Recent Labs  Lab 05/08/19 1421 05/11/19 0503 05/12/19 0602  AST 16 15 14*  ALT 16 11 9   ALKPHOS 83 67 47  BILITOT 0.5 0.5 0.4  PROT 6.7 5.1* 4.3*  ALBUMIN 3.6 2.6* 2.3*   No results for input(s): LIPASE, AMYLASE in the last 168 hours. No results for input(s): AMMONIA in the last 168 hours. Coagulation profile Recent Labs  Lab 05/10/19 1459 05/11/19 0503  INR 1.0 1.0    Cardiac Enzymes: Recent Labs  Lab 05/10/19 1459  CKTOTAL 109   BNP: Invalid input(s): POCBNP CBG: No results for input(s): GLUCAP in the last 168 hours. D-Dimer No results for input(s): DDIMER in the last 72 hours. Hgb A1c No results for  input(s): HGBA1C in the last 72 hours. Lipid Profile No results for input(s): CHOL, HDL, LDLCALC, TRIG, CHOLHDL, LDLDIRECT in the last 72 hours. Thyroid function studies Recent Labs    05/10/19 1622  TSH 3.046   Anemia work up No results for input(s): VITAMINB12, FOLATE, FERRITIN, TIBC, IRON, RETICCTPCT in the last 72 hours. Urinalysis    Component Value Date/Time   COLORURINE YELLOW 05/11/2019 [place] 05/11/2019 0819   LABSPEC 1.021 05/11/2019 0819   PHURINE 5.0 05/11/2019 0819   GLUCOSEU NEGATIVE 05/11/2019 0819   HGBUR MODERATE (A) 05/11/2019 0819   BILIRUBINUR NEGATIVE 05/11/2019 0819   KETONESUR NEGATIVE 05/11/2019 0819   PROTEINUR NEGATIVE 05/11/2019 0819   UROBILINOGEN 0.2 10/02/2008 0830   NITRITE NEGATIVE 05/11/2019 0819   LEUKOCYTESUR NEGATIVE 05/11/2019 0819     Microbiology Recent Results (from the past 240 hour(s))  Respiratory Panel by RT PCR (Flu A&B, Covid) - Nasopharyngeal Swab     Status: None   Collection Time: 05/10/19  3:50 PM   Specimen: Nasopharyngeal Swab  Result Value Ref Range Status   SARS Coronavirus 2 by RT PCR NEGATIVE NEGATIVE Final    Comment: (NOTE) SARS-CoV-2 target nucleic acids are NOT DETECTED. The SARS-CoV-2 RNA is generally detectable in upper respiratoy specimens during the acute phase of infection. The lowest concentration of SARS-CoV-2 viral copies this assay can detect is 131 copies/mL. A negative result does not preclude SARS-Cov-2 infection and should not be used as the sole basis for treatment or other patient management decisions. A negative result may occur with  improper specimen collection/handling, submission of specimen other than nasopharyngeal swab, presence of viral mutation(s) within the areas targeted by this assay, and inadequate number of viral copies (<131 copies/mL). A negative result must be combined with clinical observations, patient history, and epidemiological information. The expected  result is Negative. Fact Sheet for Patients:  PinkCheek.be Fact Sheet for Healthcare Providers:  GravelBags.it This test is not yet ap proved or cleared by the [place] FDA and  has been authorized for detection and/or diagnosis of SARS-CoV-2 by FDA under an Emergency Use Authorization (EUA). This EUA will remain  in effect (meaning this  test can be used) for the duration of the COVID-19 declaration under Section 564(b)(1) of the Act, 21 U.S.C. section 360bbb-3(b)(1), unless the authorization is terminated or revoked sooner.    Influenza A by PCR NEGATIVE NEGATIVE Final   Influenza B by PCR NEGATIVE NEGATIVE Final    Comment: (NOTE) The Xpert Xpress SARS-CoV-2/FLU/RSV assay is intended as an aid in  the diagnosis of influenza from Nasopharyngeal swab specimens and  should not be used as a sole basis for treatment. Nasal washings and  aspirates are unacceptable for Xpert Xpress SARS-CoV-2/FLU/RSV  testing. Fact Sheet for Patients: PinkCheek.be Fact Sheet for Healthcare Providers: GravelBags.it This test is not yet approved or cleared by the [place] FDA and  has been authorized for detection and/or diagnosis of SARS-CoV-2 by  FDA under an Emergency Use Authorization (EUA). This EUA will remain  in effect (meaning this test can be used) for the duration of the  Covid-19 declaration under Section 564(b)(1) of the Act, 21  U.S.C. section 360bbb-3(b)(1), unless the authorization is  terminated or revoked. Performed at [facility], [place] [address] [place], [place] [address]   MRSA PCR Screening     Status: None   Collection Time: 05/10/19  9:57 PM   Specimen: Nasal Mucosa; Nasopharyngeal  Result Value Ref Range Status   MRSA by PCR NEGATIVE NEGATIVE Final    Comment:        The GeneXpert MRSA Assay (FDA approved for NASAL specimens only), is one  component of a comprehensive MRSA colonization surveillance program. It is not intended to diagnose MRSA infection nor to guide or monitor treatment for MRSA infections. Performed at [facility], [place] [address], [place], [place] [address]        Inpatient Medications:   Scheduled Meds: . acetaminophen  1,000 mg Oral Q6H  . ALPRAZolam  0.5 mg Oral QHS  . Chlorhexidine Gluconate Cloth  6 each Topical Daily  . hydroxypropyl methylcellulose / hypromellose  1 drop Both Eyes QID  . levothyroxine  100 mcg Oral Q0600   Continuous Infusions: . sodium chloride 125 mL/hr at 05/12/19 0933  . methocarbamol (ROBAXIN) IV       Radiological Exams on Admission: DG Chest 1 View  Result Date: 05/10/2019 CLINICAL DATA:  Trauma secondary to a fall in the shower this morning. EXAM: CHEST  1 VIEW COMPARISON:  06/11/2015 FINDINGS: Heart size and pulmonary vascularity are normal. Lungs are clear. No effusions. Multiple old thoracic compression fractures some of which have been treated with vertebroplasty. There is a new compression fracture of T10 since 06/11/2015. IMPRESSION: New compression fracture of T10 since 06/11/2015. No acute cardiopulmonary abnormalities. Electronically Signed   By: Lorriane Shire M.D.   On: 05/10/2019 14:50   DG Knee 1-2 Views Right  Result Date: 05/10/2019 CLINICAL DATA:  Fall EXAM: RIGHT KNEE - 1-2 VIEW COMPARISON:  Concurrent femur radiograph. FINDINGS: Displaced distal femoral diaphysis fracture better assessed on dedicated femur radiographs performed concurrently. Mild tricompartmental degenerative changes of the knee. No other acute fracture or traumatic malalignment is seen. Small effusion is present. IMPRESSION: Displaced distal femoral fracture better assessed on dedicated femur radiographs performed concurrently. Trace knee effusion.  No additional fractures. Electronically Signed   By: Lovena Le M.D.   On: 05/10/2019 15:43   CT FEMUR RIGHT WO  CONTRAST  Result Date: 05/10/2019 CLINICAL DATA:  79 year old female with trauma to the right lower extremity. EXAM: CT OF THE LOWER RIGHT EXTREMITY WITHOUT CONTRAST TECHNIQUE:  Multidetector CT imaging of the right lower extremity was performed according to the standard protocol. COMPARISON:  Right lower extremity radiograph dated 05/10/2019. FINDINGS: Bones/Joint/Cartilage There is a displaced spiral fracture of the distal third of the right femoral diaphysis. There is external rotation and valgus angulation of the distal fracture fragment. The bones are osteopenic. There is no dislocation. There is a small suprapatellar effusion. Partially visualized lucent area in the lateral aspect of the right sacral bone adjacent to the sacroiliac joint (series 3, image 1) noted which is not characterized and appears new since the study of 06/11/2015. This may be represent degenerative changes and osteopenia although a lytic lesion is not excluded. Ligaments Suboptimally assessed by CT. Muscles and Tendons No intramuscular hematoma or large fluid collection. Soft tissues Sigmoid diverticulosis. There is stranding and edema in the deep fascial of the right thigh as well as edema of the subcutaneous soft tissues of the distal thigh and knee. IMPRESSION: 1. Displaced spiral fracture of the distal third of the right femoral diaphysis with external rotation and valgus angulation of the distal fracture fragment. No dislocation. 2. Small suprapatellar effusion. 3. Partially visualized lucent area in the lateral aspect of the right sacral bone adjacent to the SI joint. This may represent degenerative changes and osteopenia although a lytic lesion is not excluded. MRI may provide better evaluation on a nonemergent/outpatient basis. Electronically Signed   By: Anner Crete M.D.   On: 05/10/2019 21:09   DG C-Arm 1-60 Min  Result Date: 05/11/2019 CLINICAL DATA:  Right femur fracture fixation. EXAM: RIGHT FEMUR 2 VIEWS; DG C-ARM  1-60 MIN COMPARISON:  Radiographs and CT right femur 05/10/2019 and pelvis 05/01/2019. FINDINGS: C-arm fluoroscopy was provided in the operating room. 2 minutes and 23 seconds of fluoroscopy time. 7 spot fluoroscopic images are submitted. These demonstrate the placement of a right femoral intramedullary nail which is secured by 2 proximal and 4 distal interlocking screws. Final images demonstrate near anatomic reduction of the main mid diaphyseal fracture fragments. No demonstrated complications. IMPRESSION: Intraoperative views during right femoral fracture ORIF. This fracture is likely pathologic based on recent CT findings. Electronically Signed   By: Richardean Sale M.D.   On: 05/11/2019 12:00   DG Hip Unilat With Pelvis 2-3 Views Right  Result Date: 05/10/2019 CLINICAL DATA:  Right hip pain since a fall in the shower this morning. EXAM: DG HIP (WITH OR WITHOUT PELVIS) 2-3V RIGHT COMPARISON:  Radiographs dated 03/26/2019 FINDINGS: There is no evidence of hip fracture or dislocation. There is no evidence of arthropathy or other focal bone abnormality. IMPRESSION: Negative. Electronically Signed   By: Lorriane Shire M.D.   On: 05/10/2019 14:51   DG FEMUR, MIN 2 VIEWS RIGHT  Result Date: 05/11/2019 CLINICAL DATA:  Right femur fracture fixation. EXAM: RIGHT FEMUR 2 VIEWS; DG C-ARM 1-60 MIN COMPARISON:  Radiographs and CT right femur 05/10/2019 and pelvis 05/01/2019. FINDINGS: C-arm fluoroscopy was provided in the operating room. 2 minutes and 23 seconds of fluoroscopy time. 7 spot fluoroscopic images are submitted. These demonstrate the placement of a right femoral intramedullary nail which is secured by 2 proximal and 4 distal interlocking screws. Final images demonstrate near anatomic reduction of the main mid diaphyseal fracture fragments. No demonstrated complications. IMPRESSION: Intraoperative views during right femoral fracture ORIF. This fracture is likely pathologic based on recent CT findings.  Electronically Signed   By: Richardean Sale M.D.   On: 05/11/2019 12:00   DG Femur Min 2 Views Right  Result Date: 05/10/2019 CLINICAL DATA:  Fall EXAM: RIGHT FEMUR 2 VIEWS COMPARISON:  Distal femur and right knee pain FINDINGS: Displaced spiral fracture of the distal femoral diaphysis with valgus angulation and external rotation of the lower fracture fragment. No proximal femoral fracture is seen. Femoral head remains normally located. Moderate degenerative changes at the hip. No additional fractures seen distal to the diaphyseal fracture site. Dedicated knee radiographs were performed concurrently. Extensive overlying soft tissue swelling without soft tissue gas to suggest radiographic evidence of compound fracture. IMPRESSION: Displaced spiral fracture of the distal femoral diaphysis with valgus angulation and external rotation of the lower fracture fragment. Electronically Signed   By: Lovena Le M.D.   On: 05/10/2019 15:42   DG FEMUR PORT, MIN 2 VIEWS RIGHT  Result Date: 05/11/2019 CLINICAL DATA:  Postop right femoral nail placement EXAM: RIGHT FEMUR PORTABLE 2 VIEW COMPARISON:  None. FINDINGS: Mildly comminuted oblique fracture of the distal femoral diaphysis transfixed with a intramedullary nail and interlocking screws. 6 mm of anterior displacement. No other fracture or dislocation. Generalized osteopenia. Postsurgical changes in the surrounding soft tissues. IMPRESSION: 1. Mildly comminuted oblique fracture of the distal femoral diaphysis transfixed with an intramedullary nail and interlocking screws. 6 mm of anterior displacement. Electronically Signed   By: Kathreen Devoid   On: 05/11/2019 12:40    Impression/Recommendations Active Problems:   TOBACCO ABUSE   Compression fracture of L2 lumbar vertebra (HCC)   Closed fracture of right femur (HCC)   Hypothyroidism   Unspecified atrial fibrillation (HCC)   Anxiety   Anemia, unspecified   Sacral lytic lesions  Closed fracture RIGHT  femur -Repair per orthopedic surgery. -Most likely site of significant loss of blood.  Per patient and daughter no history of anemia, and colonoscopy 2018 was clear.  Negative history of gastric ulcers. -Although patient has some symptoms that could be consistent with multiple myeloma patient should be low risk for surgery, given negative cardiac history (previous past history of A. fib not treated), or respiratory history.  Compression fracture L2 lumbar vertebrae/Sacral lytic lesions -Patient has multiple sacral lytic lesions and new L2 lumbar vertebrae fracture, which may have been the cause/contributed to patient's initial fall.  Patient was seeing Dr. Ellene Route neurosurgery for work-up.  If patient is to be in hospital for several days post femur repair recommend consulting Dr. Ellene Route -Lesions could be consistent with multiple myeloma -UPEP pending -SPEP pending -Kappa/lambda free light chain pending  Unspecified atrial fibrillation -Currently NSR -Not on home medication for A. fib. -Would monitor closely given stress of current situation though patient has not been in A. fib may cause her to lapse into atrial fibrillation. -Transfuse for hemoglobin<7  Anemia -See closed fracture -Occult blood  Hypothyroidism -Synthroid 100 mcg daily  Hypomagnesmia -Magnesium goal> 2 -Magnesium IV 2 g  Anxiety -Xanax 0.5 mg qhs    Thank you for this consultation.  Our [facility] hospitalist team will follow the patient with you.   Time Spent: 40-minute  Cordell Guercio, Geraldo Docker M.D. [facility] 05/12/2019, 2:11 PM  [phone]

## 2019-05-12 NOTE — Plan of Care (Signed)
  Problem: Clinical Measurements: Goal: Will remain free from infection Outcome: Progressing Note: Pt has shown no new signs of infection during my care.  Goal: Respiratory complications will improve Outcome: Progressing Note: Pt has shown no signs of respiratory complications during my care.    Problem: Activity: Goal: Risk for activity intolerance will decrease Outcome: Progressing Note: Pt was able to sit in the chair for a few hours during my care.    Problem: Pain Managment: Goal: General experience of comfort will improve Outcome: Progressing Note: Pt's pain has been managed with scheduled medications during my care.

## 2019-05-12 NOTE — Evaluation (Signed)
Physical Therapy Evaluation Patient Details Name: Courtney Arnold MRN: [id number] DOB: 26-Oct-1940 Today's Date: 05/12/2019   History of Present Illness  Patient is a 79 y/o female presenting to the ED on 05/10/19 after a fall at home with R femur fx. S/p IM nail of R femur on 05/11/19 with patient currently WBAT. Patient with a PMH significant for AFib, T12 kyphoplasty, HTN, recent lytic lesions found on sacrum with continued work-up.     Clinical Impression  Patient admitted with the above listed diagnosis. Patient reports that prior to admission she lived at home alone where she was independent with ADLs. Patient today requiring up to Mod A +2 for bed mobility and transfers with use of RW. Requires cueing in session for sequencing and safety. Unable to progress gait on this date due to poor tolerance to weight bearing and inability to clear foot with transfers. Due to current functional status PT to recommend SNF at discharge. PT to follow acutely to maximize safe and independent functional mobility.     Follow Up Recommendations SNF;Supervision/Assistance - 24 hour    Equipment Recommendations  Rolling walker with 5" wheels    Recommendations for Other Services       Precautions / Restrictions Precautions Precautions: Fall Restrictions Weight Bearing Restrictions: Yes RLE Weight Bearing: Weight bearing as tolerated      Mobility  Bed Mobility Overal bed mobility: Needs Assistance Bed Mobility: Supine to Sit     Supine to sit: Min assist;Mod assist;+2 for physical assistance     General bed mobility comments: Min-light Mod A +2 to come to EOB with use of bed pad to scoot towards edge; pain increased with knee flexion  Transfers Overall transfer level: Needs assistance Equipment used: Rolling walker (2 wheeled) Transfers: Sit to/from [facility] Sit to Stand: Min assist;Mod assist;+2 physical assistance Stand pivot transfers: Min assist;Mod assist;+2 physical  assistance       General transfer comment: +2 for sit to stand at bedside and [facility] for pivot transfers to [facility] and recliner - use of RW with PT assisting to guide device  Ambulation/Gait             General Gait Details: unable to progress gait today due to pain wtih R LE weight bearing - unable to clear feet with pivot transfers  Stairs            Wheelchair Mobility    Modified Rankin (Stroke Patients Only)       Balance Overall balance assessment: Needs assistance Sitting-balance support: Bilateral upper extremity supported;Feet supported Sitting balance-Leahy Scale: Fair     Standing balance support: Bilateral upper extremity supported;During functional activity Standing balance-Leahy Scale: Poor Standing balance comment: reliant on RW                             Pertinent Vitals/Pain Pain Assessment: Faces Faces Pain Scale: Hurts even more Pain Location: R LE with mobility Pain Descriptors / Indicators: Aching;Discomfort;Grimacing;Guarding Pain Intervention(s): Limited activity within patient's tolerance;Monitored during session;Repositioned    Home Living Family/patient expects to be discharged to:: Private residence Living Arrangements: Alone Available Help at Discharge: Family;Friend(s) Type of Home: House Home Access: Stairs to enter Entrance Stairs-Rails: Can reach both Entrance Stairs-Number of Steps: 3 Home Layout: One level Home Equipment: None      Prior Function Level of Independence: Independent               Hand Dominance  Extremity/Trunk Assessment   Upper Extremity Assessment Upper Extremity Assessment: Defer to OT evaluation    Lower Extremity Assessment Lower Extremity Assessment: Generalized weakness;RLE deficits/detail RLE Deficits / Details: expected post-op pain and weakness       Communication   Communication: No difficulties  Cognition Arousal/Alertness: Awake/alert Behavior During  Therapy: WFL for tasks assessed/performed Overall Cognitive Status: Within Functional Limits for tasks assessed                                        General Comments      Exercises     Assessment/Plan    PT Assessment Patient needs continued PT services  PT Problem List Decreased strength;Decreased activity tolerance;Decreased balance;Decreased mobility;Decreased knowledge of use of DME;Decreased safety awareness;Decreased knowledge of precautions       PT Treatment Interventions DME instruction;Gait training;Stair training;Functional mobility training;Therapeutic activities;Therapeutic exercise;Balance training;Neuromuscular re-education;Patient/family education    PT Goals (Current goals can be found in the Care Plan section)  Acute Rehab PT Goals Patient Stated Goal: reduce pain; go home PT Goal Formulation: With patient Time For Goal Achievement: 05/26/19 Potential to Achieve Goals: Good    Frequency Min 3X/week   Barriers to discharge        Co-evaluation PT/OT/SLP Co-Evaluation/Treatment: Yes Reason for Co-Treatment: Complexity of the patient's impairments (multi-system involvement);Necessary to address cognition/behavior during functional activity;For patient/therapist safety;To address functional/ADL transfers PT goals addressed during session: Mobility/safety with mobility;Balance;Proper use of DME         AM-PAC PT "6 Clicks" Mobility  Outcome Measure Help needed turning from your back to your side while in a flat bed without using bedrails?: A Lot Help needed moving from lying on your back to sitting on the side of a flat bed without using bedrails?: A Lot Help needed moving to and from a bed to a chair (including a wheelchair)?: A Lot Help needed standing up from a chair using your arms (e.g., wheelchair or bedside chair)?: A Lot Help needed to walk in hospital room?: Total Help needed climbing 3-5 steps with a railing? : Total 6 Click  Score: 10    End of Session Equipment Utilized During Treatment: Gait belt Activity Tolerance: Patient tolerated treatment well;Patient limited by pain Patient left: in chair;with call bell/phone within reach;with chair alarm set Nurse Communication: Mobility status PT Visit Diagnosis: Unsteadiness on feet (R26.81);Other abnormalities of gait and mobility (R26.89);Muscle weakness (generalized) (M62.81)    Time: [id number] PT Time Calculation (min) (ACUTE ONLY): 27 min   Charges:   PT Evaluation $PT Eval Moderate Complexity: 1 Mod          Lanney Gins, PT, DPT Supplemental Physical Therapist 05/12/19 2:06 PM Pager: [phone] Office: [phone]

## 2019-05-13 LAB — MAGNESIUM: Magnesium: 2.2 mg/dL (ref 1.7–2.4)

## 2019-05-13 LAB — COMPREHENSIVE METABOLIC PANEL
ALT: 9 U/L (ref 0–44)
AST: 16 U/L (ref 15–41)
Albumin: 2 g/dL — ABNORMAL LOW (ref 3.5–5.0)
Alkaline Phosphatase: 56 U/L (ref 38–126)
Anion gap: 9 (ref 5–15)
BUN: 14 mg/dL (ref 8–23)
CO2: 28 mmol/L (ref 22–32)
Calcium: 7.4 mg/dL — ABNORMAL LOW (ref 8.9–10.3)
Chloride: 100 mmol/L (ref 98–111)
Creatinine, Ser: 0.6 mg/dL (ref 0.44–1.00)
GFR calc Af Amer: >60 mL/min (ref >60)
GFR calc non Af Amer: >60 mL/min (ref >60)
Glucose, Bld: 85 mg/dL (ref 70–99)
Potassium: 3.4 mmol/L — ABNORMAL LOW (ref 3.5–5.1)
Sodium: 137 mmol/L (ref 135–145)
Total Bilirubin: 0.5 mg/dL (ref 0.3–1.2)
Total Protein: 4.2 g/dL — ABNORMAL LOW (ref 6.5–8.1)

## 2019-05-13 LAB — CBC
HCT: 22.5 % — ABNORMAL LOW (ref 36.0–46.0)
Hemoglobin: 7 g/dL — ABNORMAL LOW (ref 12.0–15.0)
MCH: 27.8 pg (ref 26.0–34.0)
MCHC: 31.1 g/dL (ref 30.0–36.0)
MCV: 89.3 fL (ref 80.0–100.0)
Platelets: 140 10*3/uL — ABNORMAL LOW (ref 150–400)
RBC: 2.52 MIL/uL — ABNORMAL LOW (ref 3.87–5.11)
RDW: 15.3 % (ref 11.5–15.5)
WBC: 3.6 10*3/uL — ABNORMAL LOW (ref 4.0–10.5)
nRBC: 0 % (ref 0.0–0.2)

## 2019-05-13 LAB — PREPARE RBC (CROSSMATCH)

## 2019-05-13 MED ORDER — SODIUM CHLORIDE 0.9% IV SOLUTION: Freq: Once | INTRAVENOUS | Status: DC

## 2019-05-13 MED ORDER — DOCUSATE SODIUM 100 MG PO CAPS
200.0000 mg | ORAL_CAPSULE | Freq: Two times a day (BID) | ORAL | Status: DC | PRN
  Administered 2019-05-13: 200 mg via ORAL
  Filled 2019-05-13: qty 2

## 2019-05-13 MED ORDER — POTASSIUM CHLORIDE CRYS ER 20 MEQ PO TBCR
40.0000 meq | EXTENDED_RELEASE_TABLET | Freq: Two times a day (BID) | ORAL | Status: AC
  Administered 2019-05-13 (×2): 40 meq via ORAL
  Filled 2019-05-13 (×2): qty 2

## 2019-05-13 MED ORDER — POLYVINYL ALCOHOL 1.4 % OP SOLN
1.0000 [drp] | Freq: Four times a day (QID) | OPHTHALMIC | Status: DC
  Administered 2019-05-13 – 2019-05-21 (×29): 1 [drp] via OPHTHALMIC
  Filled 2019-05-13 (×3): qty 15

## 2019-05-13 NOTE — Progress Notes (Signed)
   ORTHOPAEDIC PROGRESS NOTE  s/p Procedure(s): RETROGRADE INTRAMEDULLARY NAIL FEMORAL for Right femoral shaft fracture by Dr. Doreatha Martin on 05/11/2019  SUBJECTIVE: Patient reports some pain about operative site. She was happy she got up to the chair with therapy yesterday. Eating breakfast this AM. No chest pain. No SOB. No nausea/vomiting. No other complaints.   OBJECTIVE: PE: Right lower extremity: incisions CDI, leg lengths equal, intact EHL/TA/GSC, endorses distal sensation, warm well perfused foot  Vitals:   05/12/19 2100 05/13/19 0500  BP: 132/60 136/62  Pulse: 99 99  Resp: 16 16  Temp: 98.7 F (37.1 C) 98.5 F (36.9 C)  SpO2: 97% 98%     ASSESSMENT: Courtney Arnold is a 79 y.o. female status post above. POD#2.   PLAN: Weightbearing: WBAT RLE Insicional and dressing care: Dressings removed today. Reinforce dressings as needed PRN dry guaze. Incisions can be left open to air.  Orthopedic device(s): None VTE prophylaxis: Lovenox 40mg  qd once Hgb stable. Hgb currently 7. (Lovenox held). SCDs. Ambulation.  Acute blood loss anemia: Hgb 7. Per medicine team, hold transfusing until Hgb <7. Will continue to monitor closely.  Pain control: PRN medications, preferring oral medications. Minimize narcotics as able Follow - up plan: 2 weeks in office with Dr. Doreatha Martin Dispo: PT/OT recommending SNF.   Noemi Chapel, PA-C 05/13/2019

## 2019-05-13 NOTE — Consult Note (Signed)
Medical Consultation   Courtney Arnold  [id number]  DOB: 1940/10/19  DOA: 05/10/2019  PCP: [name], MD    Requesting physician: Dr Rod Can orthopedic surgery  Reason for consultation: Medical clearance   History of Present Illness: Courtney Arnold is an 79 y.o. female WF PMHx atrial fibrillation, compression fracture T12 s/p kyphoplasty, HTN, Anemia, Hypothyroidism, Anxiety.  Is being evaluated for lower back pain, bilateral lower extremity weakness, what patient describes as legs giving out causing her to fall.  Patient was due to have a PET scan next week for further evaluation by Dr. Ellene Route neurosurgery.  3/7 A/O x4, negative CP, negative S OB,    Review of Systems:  Review of Systems  Constitutional: Negative.   HENT: Negative.   Eyes: Negative.   Respiratory: Negative.   Cardiovascular: Negative.   Gastrointestinal: Negative.   Genitourinary: Negative.   Musculoskeletal: Positive for back pain, falls and joint pain. Negative for myalgias and neck pain.  Skin: Negative.   Neurological: Positive for dizziness, tingling, tremors, sensory change, speech change, seizures, loss of consciousness and headaches. Negative for focal weakness and weakness.  Endo/Heme/Allergies: Negative.   Psychiatric/Behavioral: Negative.     Past Medical History: Past Medical History:  Diagnosis Date  . Atrial fibrillation ([place]) 2011   Postop, spontaneous conversion to normal sinus after one hour  . Compression fracture 07/24/09   T12; kyphoplasty  . History of echocardiogram 5/11   EF 65%  . Hypertension   . Thyroid disease   . Tobacco abuse     Past Surgical History: Past Surgical History:  Procedure Laterality Date  . BACK SURGERY    . BACK SURGERY  06/06/2015  . BREAST EXCISIONAL BIOPSY Left    50 years ago  benign  . CHOLECYSTECTOMY N/A 04/25/2015   Procedure: LAPAROSCOPIC CHOLECYSTECTOMY WITH INTRAOPERATIVE CHOLANGIOGRAM;  Surgeon: Mickeal Skinner, MD;  Location: [facility];  Service: General;  Laterality: N/A;  . COLONOSCOPY N/A 11/26/2015   Procedure: COLONOSCOPY;  Surgeon: Daneil Dolin, MD;  Location: [facility];  Service: Endoscopy;  Laterality: N/A;  7:30 am  . ERCP N/A 04/14/2015   Procedure: ENDOSCOPIC RETROGRADE CHOLANGIOPANCREATOGRAPHY (ERCP) Biliary Sphincterotomy, 10x7 stent placement Dilated bilary system just not well seen;  Surgeon: Rogene Houston, MD;  Location: [facility];  Service: Endoscopy;  Laterality: N/A;  . ERCP N/A 06/12/2015   Procedure: ENDOSCOPIC RETROGRADE CHOLANGIOPANCREATOGRAPHY (ERCP);  Surgeon: Rogene Houston, MD;  Location: [facility];  Service: Endoscopy;  Laterality: N/A;  . ESOPHAGOGASTRODUODENOSCOPY N/A 06/12/2015   Procedure: DIAGNOSTIC ESOPHAGOGASTRODUODENOSCOPY (EGD);  Surgeon: Rogene Houston, MD;  Location: [facility];  Service: Endoscopy;  Laterality: N/A;  . STENT REMOVAL  06/12/2015   Procedure: STENT REMOVAL ;  Surgeon: Rogene Houston, MD;  Location: [facility];  Service: Endoscopy;;     Allergies:  No Known Allergies   Social History:  reports that she quit smoking about 6 years ago. She has a 3.00 pack-year smoking history. She has never used smokeless tobacco. She reports that she does not drink alcohol or use drugs.   Family History: Family History  Problem Relation Age of Onset  . Heart attack Father   . Stroke Father   . Breast cancer Sister   . Thyroid disease Neg Hx   . Colon cancer Neg Hx       Procedures/Significant Events:  2/23 CT renal  stone study; Expansile lytic lesions are present within the sacrum including a 4.2 cm lesion in the right sacral ala which extends into the right SI joint and a smaller infiltrating lytic focus in the left sacral ala measuring up to 2.4 cm (5/63) and a separate focus more posteriorly measuring 1.9 cm (5/69, heterogeneous marrow elsewhere in the sacrum is conspicuous as well and worrisome for additional site of osseous  involvement. -Stable appearance of an L2 superior endplate compression deformity prior T12 compression deformity with post vertebroplasty changes. -Biliary ductal dilatation, greater than expected for age and reservoir effect. Consider liver serologies and if elevated MRCP could be obtained. 3/4 CXR;New compression fracture of T10 since 06/11/2015. No acute cardiopulmonary abnormalities. 3/4 DG femur RIGHT;Displaced spiral fracture of the distal femoral diaphysis with valgus angulation and external rotation of the lower fracture fragment    I have personally reviewed and interpreted all radiology studies and my findings are as above.     Continuous Infusions: . sodium chloride 125 mL/hr at 05/13/19 0618  . methocarbamol (ROBAXIN) IV       Physical Exam: Vitals:   05/13/19 0500 05/13/19 0804 05/13/19 0900 05/13/19 1504  BP: 136/62  (!) 113/53 (!) 108/54  Pulse: 99  (!) 103 (!) 102  Resp: _0 Temp: 98.5 F (36.9 C)  99 F (37.2 C) 98.3 F (36.8 C)  TempSrc: Oral  Oral Oral  SpO2: 98%  98% 99%  Weight:      Height:       Physical Exam:  General: A/O x4 no acute respiratory distress Eyes: negative scleral hemorrhage, negative anisocoria, negative icterus ENT: Negative Runny nose, negative gingival bleeding, Neck:  Negative scars, masses, torticollis, lymphadenopathy, JVD Lungs: Clear to auscultation bilaterally without wheezes or crackles Cardiovascular: Regular rate and rhythm without murmur gallop or rub normal S1 and S2 Abdomen: negative abdominal pain, nondistended, positive soft, bowel sounds, no rebound, no ascites, no appreciable mass Extremities: POSITIVE swelling RIGHT thigh decreased pain to palpation  Skin: Negative rashes, lesions, ulcers Psychiatric:  Negative depression, negative anxiety, negative fatigue, negative mania  Central nervous system:  Cranial nerves II through XII intact, tongue/uvula midline, all extremities muscle strength 5/5, sensation  intact throughout, negative dysarthria, negative expressive aphasia, negative receptive aphasia.   Data reviewed:  I have personally reviewed following labs and imaging studies Labs:  CBC: Recent Labs  Lab 05/08/19 1421 05/08/19 1421 05/10/19 1459 05/10/19 1459 05/11/19 0503 05/11/19 2132 05/12/19 0452 05/12/19 1224 05/13/19 0407  WBC 4.8  --  6.2  --  4.1  --   --   --  3.6*  NEUTROABS 2.7  --  4.8  --  2.5  --   --   --   --   HGB 8.5*   < > 6.9*   < > 10.4* 7.4* 7.1* 7.5* 7.0*  HCT 27.8*   < > 21.9*   < > 31.8* 23.1* 22.3* 23.1* 22.5*  MCV 90.8  --  88.3  --  86.6  --   --   --  89.3  PLT 184  --  218  --  154  --   --   --  140*   < > = values in this interval not displayed.    Basic Metabolic Panel: Recent Labs  Lab 05/08/19 1421 05/08/19 1421 05/10/19 1459 05/10/19 1459 05/11/19 0503 05/11/19 0503 05/12/19 0602 05/13/19 0407  NA 135  --  135  --  136  --  136 137  K 3.9   < > 3.7   < > 3.8   < > 3.7 3.4*  CL 98  --  96*  --  99  --  101 100  CO2 29  --  29  --  29  --  28 28  GLUCOSE 90  --  145*  --  104*  --  93 85  BUN 27*  --  26*  --  21  --  18 14  CREATININE 0.74  --  0.62  --  0.68  --  0.70 0.60  CALCIUM 8.8*  --  8.5*  --  8.4*  --  8.0* 7.4*  MG  --   --   --   --  1.9  --  1.7 2.2   < > = values in this interval not displayed.   GFR Estimated Creatinine Clearance: 46.6 mL/min (by C-G formula based on SCr of 0.6 mg/dL). Liver Function Tests: Recent Labs  Lab 05/08/19 1421 05/11/19 0503 05/12/19 0602 05/13/19 0407  AST 16 15 14* 16  ALT _0 ALKPHOS 83 67 47 56  BILITOT 0.5 0.5 0.4 0.5  PROT 6.7 5.1* 4.3* 4.2*  ALBUMIN 3.6 2.6* 2.3* 2.0*   No results for input(s): LIPASE, AMYLASE in the last 168 hours. No results for input(s): AMMONIA in the last 168 hours. Coagulation profile Recent Labs  Lab 05/10/19 1459 05/11/19 0503  INR 1.0 1.0    Cardiac Enzymes: Recent Labs  Lab 05/10/19 1459  CKTOTAL 109   BNP: Invalid  input(s): POCBNP CBG: No results for input(s): GLUCAP in the last 168 hours. D-Dimer No results for input(s): DDIMER in the last 72 hours. Hgb A1c No results for input(s): HGBA1C in the last 72 hours. Lipid Profile No results for input(s): CHOL, HDL, LDLCALC, TRIG, CHOLHDL, LDLDIRECT in the last 72 hours. Thyroid function studies No results for input(s): TSH, T4TOTAL, T3FREE, THYROIDAB in the last 72 hours.  Invalid input(s): FREET3 Anemia work up No results for input(s): VITAMINB12, FOLATE, FERRITIN, TIBC, IRON, RETICCTPCT in the last 72 hours. Urinalysis    Component Value Date/Time   COLORURINE YELLOW 05/11/2019 [place] 05/11/2019 0819   LABSPEC 1.021 05/11/2019 0819   PHURINE 5.0 05/11/2019 0819   GLUCOSEU NEGATIVE 05/11/2019 0819   HGBUR MODERATE (A) 05/11/2019 0819   BILIRUBINUR NEGATIVE 05/11/2019 0819   KETONESUR NEGATIVE 05/11/2019 0819   PROTEINUR NEGATIVE 05/11/2019 0819   UROBILINOGEN 0.2 10/02/2008 0830   NITRITE NEGATIVE 05/11/2019 0819   LEUKOCYTESUR NEGATIVE 05/11/2019 0819     Microbiology Recent Results (from the past 240 hour(s))  Respiratory Panel by RT PCR (Flu A&B, Covid) - Nasopharyngeal Swab     Status: None   Collection Time: 05/10/19  3:50 PM   Specimen: Nasopharyngeal Swab  Result Value Ref Range Status   SARS Coronavirus 2 by RT PCR NEGATIVE NEGATIVE Final    Comment: (NOTE) SARS-CoV-2 target nucleic acids are NOT DETECTED. The SARS-CoV-2 RNA is generally detectable in upper respiratoy specimens during the acute phase of infection. The lowest concentration of SARS-CoV-2 viral copies this assay can detect is 131 copies/mL. A negative result does not preclude SARS-Cov-2 infection and should not be used as the sole basis for treatment or other patient management decisions. A negative result may occur with  improper specimen collection/handling, submission of specimen other than nasopharyngeal swab, presence of viral  mutation(s) within the areas targeted by this assay, and inadequate  number of viral copies (<131 copies/mL). A negative result must be combined with clinical observations, patient history, and epidemiological information. The expected result is Negative. Fact Sheet for Patients:  PinkCheek.be Fact Sheet for Healthcare Providers:  GravelBags.it This test is not yet ap proved or cleared by the [place] FDA and  has been authorized for detection and/or diagnosis of SARS-CoV-2 by FDA under an Emergency Use Authorization (EUA). This EUA will remain  in effect (meaning this test can be used) for the duration of the COVID-19 declaration under Section 564(b)(1) of the Act, 21 U.S.C. section 360bbb-3(b)(1), unless the authorization is terminated or revoked sooner.    Influenza A by PCR NEGATIVE NEGATIVE Final   Influenza B by PCR NEGATIVE NEGATIVE Final    Comment: (NOTE) The Xpert Xpress SARS-CoV-2/FLU/RSV assay is intended as an aid in  the diagnosis of influenza from Nasopharyngeal swab specimens and  should not be used as a sole basis for treatment. Nasal washings and  aspirates are unacceptable for Xpert Xpress SARS-CoV-2/FLU/RSV  testing. Fact Sheet for Patients: PinkCheek.be Fact Sheet for Healthcare Providers: GravelBags.it This test is not yet approved or cleared by the [place] FDA and  has been authorized for detection and/or diagnosis of SARS-CoV-2 by  FDA under an Emergency Use Authorization (EUA). This EUA will remain  in effect (meaning this test can be used) for the duration of the  Covid-19 declaration under Section 564(b)(1) of the Act, 21  U.S.C. section 360bbb-3(b)(1), unless the authorization is  terminated or revoked. Performed at [facility], [place] [address], [place], [place] [address]   MRSA PCR Screening     Status: None   Collection  Time: 05/10/19  9:57 PM   Specimen: Nasal Mucosa; Nasopharyngeal  Result Value Ref Range Status   MRSA by PCR NEGATIVE NEGATIVE Final    Comment:        The GeneXpert MRSA Assay (FDA approved for NASAL specimens only), is one component of a comprehensive MRSA colonization surveillance program. It is not intended to diagnose MRSA infection nor to guide or monitor treatment for MRSA infections. Performed at [facility], [place] [address] [place], [place] [address]        Inpatient Medications:   Scheduled Meds: . sodium chloride   Intravenous Once  . acetaminophen  1,000 mg Oral Q6H  . ALPRAZolam  0.5 mg Oral QHS  . Chlorhexidine Gluconate Cloth  6 each Topical Daily  . levothyroxine  100 mcg Oral Q0600  . polyvinyl alcohol  1 drop Both Eyes QID  . potassium chloride  40 mEq Oral BID   Continuous Infusions: . sodium chloride 125 mL/hr at 05/13/19 0618  . methocarbamol (ROBAXIN) IV       Radiological Exams on Admission: No results found.  Impression/Recommendations Active Problems:   TOBACCO ABUSE   Compression fracture of L2 lumbar vertebra (HCC)   Closed fracture of right femur (HCC)   Hypothyroidism   Unspecified atrial fibrillation (HCC)   Anxiety   Anemia, unspecified   Sacral lytic lesions  Closed fracture RIGHT femur -Repair per orthopedic surgery. -Most likely site of significant loss of blood.  Per patient and daughter no history of anemia, and colonoscopy 2018 was clear.  Negative history of gastric ulcers. -Although patient has some symptoms that could be consistent with multiple myeloma patient should be low risk for surgery, given negative cardiac history (previous past history of A. fib not treated), or respiratory history. -3/7 will contact Dr.  Elsner on 3/8 and request that he review 2/23 CT renal stone study to review pathology and back.  Compression fracture L2 lumbar vertebrae/Sacral lytic lesions -Patient has multiple sacral lytic  lesions and new L2 lumbar vertebrae fracture, which may have been the cause/contributed to patient's initial fall.  Patient was seeing Dr. Ellene Route neurosurgery for work-up.  If patient is to be in hospital for several days post femur repair recommend consulting Dr. Ellene Route -Lesions could be consistent with multiple myeloma -UPEP pending -SPEP pending -Kappa/lambda free light chain pending  Unspecified atrial fibrillation -Currently NSR -Not on home medication for A. fib. -Would monitor closely given stress of current situation though patient has not been in A. fib may cause her to lapse into atrial fibrillation. -3/7 transfuse 1 unit PRBC -Transfuse for hemoglobin<7  Anemia -See closed fracture -3/4 fecal occult blood negative   Hypothyroidism -Synthroid 100 mcg daily  Hypomagnesmia -Magnesium goal> 2  Hypokalemia -Potassium goal> 4 -K-Dur 40 mEq x 2  Anxiety -Xanax 0.5 mg qhs    Thank you for this consultation.  Our [facility] hospitalist team will follow the patient with you.   Time Spent: 40-minute  Delan Ksiazek, Geraldo Docker M.D. [facility] 05/13/2019, 5:15 PM  [phone]

## 2019-05-13 NOTE — TOC Initial Note (Signed)
Transition of Care [facility]) - Initial/Assessment Note    Patient Details  Name: Courtney Arnold MRN: [id number] Date of Birth: 1940/11/04  Transition [facility] Care [facility]) CM/SW Contact:    Bary Castilla, LCSW Phone Number:  [phone] 05/13/2019, 4:48 PM  Clinical Narrative:                 CSW met with patient to discuss PT recommendation of a SNF. Patient was aware of recommendation and in agreement with going to a ST SNF. CSW discussed the SNF process.CSW provided patient with medicare.gov rating list.  Patient gave CSW permission to fax referrals out to local facilities. Patient prefers the [facility]..CSW answered questions about the SNF process and the next steps in the process. Patient asked CSW to follow up with daughter Vivien Rota and updated her about recommendation and places referrals had been sent. Vivien Rota would like to be included in the decision.  TOC team will continue to follow for discharge planning needs.    Expected Discharge Plan: Skilled Nursing Facility Barriers to Discharge: Continued Medical Work up, SNF Pending bed offer   Patient Goals and CMS Choice Patient states their goals for this hospitalization and ongoing recovery are:: To be able to go home CMS Medicare.gov Compare Post Acute Care list provided to:: Patient Choice offered to / list presented to : Patient  Expected Discharge Plan and Services Expected Discharge Plan: [place]                                              Prior Living Arrangements/Services   Lives with:: Self Patient language and need for interpreter reviewed:: Yes          Care giver support system in place?: Yes (comment)      Activities of Daily Living Home Assistive Devices/Equipment: Blood pressure cuff ADL Screening (condition at time of admission) Patient's cognitive ability adequate to safely complete daily activities?: Yes Is the patient deaf or have difficulty hearing?: No Does the patient have  difficulty seeing, even when wearing glasses/contacts?: No Does the patient have difficulty concentrating, remembering, or making decisions?: No Patient able to express need for assistance with ADLs?: Yes Does the patient have difficulty dressing or bathing?: Yes Independently performs ADLs?: Yes (appropriate for developmental age) Does the patient have difficulty walking or climbing stairs?: Yes Weakness of Legs: Both Weakness of Arms/Hands: None  Permission Sought/Granted   Permission granted to share information with : Yes, Verbal Permission Granted  Share Information with NAME: Vivien Rota  Permission granted to share info w AGENCY: SNFs  Permission granted to share info w Relationship: Daughter  Permission granted to share info w Contact Information: [phone]  Emotional Assessment Appearance:: Appears stated age Attitude/Demeanor/Rapport: Engaged Affect (typically observed): Appropriate Orientation: : Oriented to Self, Oriented to Place, Oriented to  Time, Oriented to Situation      Admission diagnosis:  Fall [W19.XXXA] Closed fracture of right femur ([place]) [S72.91XA] Lytic bone lesions on xray [M89.9] Closed displaced spiral fracture of shaft of right femur, initial encounter ([place]) [S72.341A] Symptomatic anemia [D64.9] Patient Active Problem List   Diagnosis Date Noted  . Closed fracture of right femur ([place]) 05/10/2019  . Hypothyroidism 05/10/2019  . Unspecified atrial fibrillation ([place]) 05/10/2019  . Anxiety 05/10/2019  . Anemia, unspecified 05/10/2019  . Sacral lytic lesions 05/10/2019  . Lesion of  pelvic bone 05/08/2019  . Vitamin D deficiency 02/03/2016  . 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 History of colonic polyps 11/06/2015  . 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 Cholecystitis, acute 04/25/2015  . Anemia 04/25/2015  . Lower back pain   . Nausea & vomiting 04/21/2015  . History of biliary stent insertion 04/21/2015  . Volume depletion  04/21/2015  . Hypokalemia 04/21/2015  . AP (abdominal pain)   . Compression fracture of L2 lumbar vertebra (HCC)   . Uncontrollable vomiting   . Common bile duct dilation   . Cholelithiasis 04/12/2015  . Compression fracture 04/12/2015  . Abdominal pain 04/12/2015  . TOBACCO ABUSE 08/21/2009  . ATRIAL FIBRILLATION, HX OF 08/21/2009   PCP:  [name], MD Pharmacy:   [facility], [place] [address] [place] [address] Phone: [phone] Fax: [phone]     Social Determinants of Health (SDOH) Interventions    Readmission Risk Interventions No flowsheet data found.

## 2019-05-13 NOTE — NC FL2 (Signed)
[place] LEVEL OF CARE SCREENING TOOL     IDENTIFICATION  Patient Name: Courtney Arnold Birthdate: 1941-02-22 Sex: female Admission Date (Current Location): 05/10/2019  [facility] and [place] Number:  Herbalist and Address:  [facility] [place], [place] [address] [place], [place] [address]      Provider Number: [id number]  Attending Physician Name and Address:  Shona Needles, MD  Relative Name and Phone Number:  [place]    Current Level of Care: Hospital Recommended Level of Care: [place] Prior Approval Number:    Date Approved/Denied:   PASRR Number: pending  Discharge Plan: SNF    Current Diagnoses: Patient Active Problem List   Diagnosis Date Noted  . Closed fracture of right femur ([place]) 05/10/2019  . Hypothyroidism 05/10/2019  . Unspecified atrial fibrillation ([place]) 05/10/2019  . Anxiety 05/10/2019  . Anemia, unspecified 05/10/2019  . Sacral lytic lesions 05/10/2019  . Lesion of pelvic bone 05/08/2019  . Vitamin D deficiency 02/03/2016  . 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 History of colonic polyps 11/06/2015  . 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 Cholecystitis, acute 04/25/2015  . Anemia 04/25/2015  . Lower back pain   . Nausea & vomiting 04/21/2015  . History of biliary stent insertion 04/21/2015  . Volume depletion 04/21/2015  . Hypokalemia 04/21/2015  . AP (abdominal pain)   . Compression fracture of L2 lumbar vertebra (HCC)   . Uncontrollable vomiting   . Common bile duct dilation   . Cholelithiasis 04/12/2015  . Compression fracture 04/12/2015  . Abdominal pain 04/12/2015  . TOBACCO ABUSE 08/21/2009  . ATRIAL FIBRILLATION, HX OF 08/21/2009    Orientation RESPIRATION BLADDER Height & Weight     Self, Time, Situation, Place  O2(2.5L) Incontinent, External catheter Weight: 120 lb (54.4 kg) Height:  5\' 2"  (157.5 cm)  BEHAVIORAL SYMPTOMS/MOOD  NEUROLOGICAL BOWEL NUTRITION STATUS      Continent Diet(see discharge summary)  AMBULATORY STATUS COMMUNICATION OF NEEDS Skin   Extensive Assist Verbally Surgical wounds                       Personal Care Assistance Level of Assistance  Bathing, Feeding, Dressing Bathing Assistance: Limited assistance Feeding assistance: Independent Dressing Assistance: Limited assistance     Functional Limitations Info  Sight, Hearing, Speech Sight Info: Adequate Hearing Info: Adequate Speech Info: Adequate    SPECIAL CARE FACTORS FREQUENCY  PT (By licensed PT), OT (By licensed OT)     PT Frequency: 5X per week OT Frequency: 5x per week            Contractures Contractures Info: Not present    Additional Factors Info  Code Status, Allergies Code Status Info: Full Allergies Info: NKA           Current Medications (05/13/2019):  This is the current hospital active medication list Current Facility-Administered Medications  Medication Dose Route Frequency Provider Last Rate Last Admin  . 0.9 %  sodium chloride infusion (Manually program via Guardrails IV Fluids)   Intravenous Once Allie Bossier, MD      . 0.9 %  sodium chloride infusion   Intravenous Continuous Delray Alt, PA-C 125 mL/hr at 05/13/19 0618 New Bag at 05/13/19 0618  . acetaminophen (TYLENOL) tablet 1,000 mg  1,000 mg Oral Q6H Patrecia Pace A, PA-C   1,000 mg at 05/13/19 1315  . ALPRAZolam (XANAX) tablet 0.5 mg  0.5 mg  Oral QHS Delray Alt, PA-C   0.5 mg at 05/12/19 2200  . Chlorhexidine Gluconate Cloth 2 % PADS 6 each  6 each Topical Daily Delray Alt, PA-C   6 each at 05/13/19 [id number]  . levothyroxine (SYNTHROID) tablet 100 mcg  100 mcg Oral Q0600 Delray Alt, PA-C   100 mcg at 05/13/19 [id number]  . methocarbamol (ROBAXIN) tablet 500 mg  500 mg Oral Q6H PRN Delray Alt, PA-C   500 mg at 05/11/19 2106   Or  . methocarbamol (ROBAXIN) 500 mg in dextrose 5 % 50 mL IVPB  500 mg Intravenous Q6H PRN Patrecia Pace A, PA-C      . morphine 2 MG/ML injection 1-2 mg  1-2 mg Intravenous Q2H PRN Patrecia Pace A, PA-C   2 mg at 05/11/19 1709  . ondansetron (ZOFRAN) tablet 4 mg  4 mg Oral Q6H PRN Patrecia Pace A, PA-C       Or  . ondansetron [facility]) injection 4 mg  4 mg Intravenous Q6H PRN Patrecia Pace A, PA-C   4 mg at 05/12/19 2156  . oxyCODONE (Oxy IR/ROXICODONE) immediate release tablet 5-10 mg  5-10 mg Oral Q4H PRN Patrecia Pace A, PA-C   10 mg at 05/12/19 2200  . polyvinyl alcohol (LIQUIFILM TEARS) 1.4 % ophthalmic solution 1 drop  1 drop Both Eyes QID Ethelda Chick, PA-C   1 drop at 05/13/19 1541  . potassium chloride SA (KLOR-CON) CR tablet 40 mEq  40 mEq Oral BID Allie Bossier, MD   40 mEq at 05/13/19 1541     Discharge Medications: Please see discharge summary for a list of discharge medications.  Relevant Imaging Results:  Relevant Lab Results:   Additional Information SS# [id number] will require 30 skilled services  Bary Castilla, LCSW

## 2019-05-13 NOTE — Plan of Care (Signed)
  Problem: Education: Goal: Knowledge of General Education information will improve Description: Including pain rating scale, medication(s)/side effects and non-pharmacologic comfort measures Outcome: Progressing   Problem: Activity: Goal: Risk for activity intolerance will decrease Outcome: Progressing   Problem: Nutrition: Goal: Adequate nutrition will be maintained Outcome: Progressing   Problem: Coping: Goal: Level of anxiety will decrease Outcome: Progressing  Pt intake adequate. Foley in place from procedure. 24 hour urine collection ended.

## 2019-05-13 NOTE — Consult Note (Signed)
Medical Consultation   Courtney Arnold  PVX:[id number]  DOB: 1940/10/19  DOA: 05/10/2019  PCP: [name], MD    Requesting physician: Dr Rod Can orthopedic surgery  Reason for consultation: Medical clearance   History of Present Illness: Courtney Arnold is an 79 y.o. female [facility] PMHx atrial fibrillation, compression fracture T12 s/p kyphoplasty, HTN, Anemia, Hypothyroidism, Anxiety.  Is being evaluated for lower back pain, bilateral lower extremity weakness, what patient describes as legs giving out causing her to fall.  Patient was due to have a PET scan next week for further evaluation by Dr. Ellene Route neurosurgery.  3/7 A/O x4, negative CP, negative S OB,    Review of Systems:  Review of Systems  Constitutional: Negative.   HENT: Negative.   Eyes: Negative.   Respiratory: Negative.   Cardiovascular: Negative.   Gastrointestinal: Negative.   Genitourinary: Negative.   Musculoskeletal: Positive for back pain, falls and joint pain. Negative for myalgias and neck pain.  Skin: Negative.   Neurological: Positive for dizziness, tingling, tremors, sensory change, speech change, seizures, loss of consciousness and headaches. Negative for focal weakness and weakness.  Endo/Heme/Allergies: Negative.   Psychiatric/Behavioral: Negative.     Past Medical History: Past Medical History:  Diagnosis Date  . Atrial fibrillation ([place]) 2011   Postop, spontaneous conversion to normal sinus after one hour  . Compression fracture 07/24/09   T12; kyphoplasty  . History of echocardiogram 5/11   EF 65%  . Hypertension   . Thyroid disease   . Tobacco abuse     Past Surgical History: Past Surgical History:  Procedure Laterality Date  . BACK SURGERY    . BACK SURGERY  06/06/2015  . BREAST EXCISIONAL BIOPSY Left    50 years ago  benign  . CHOLECYSTECTOMY N/A 04/25/2015   Procedure: LAPAROSCOPIC CHOLECYSTECTOMY WITH INTRAOPERATIVE CHOLANGIOGRAM;  Surgeon: Mickeal Skinner, MD;  Location: [facility];  Service: General;  Laterality: N/A;  . COLONOSCOPY N/A 11/26/2015   Procedure: COLONOSCOPY;  Surgeon: Daneil Dolin, MD;  Location: [facility];  Service: Endoscopy;  Laterality: N/A;  7:30 am  . ERCP N/A 04/14/2015   Procedure: ENDOSCOPIC RETROGRADE CHOLANGIOPANCREATOGRAPHY (ERCP) Biliary Sphincterotomy, 10x7 stent placement Dilated bilary system just not well seen;  Surgeon: Rogene [place], MD;  Location: [facility];  Service: Endoscopy;  Laterality: N/A;  . ERCP N/A 06/12/2015   Procedure: ENDOSCOPIC RETROGRADE CHOLANGIOPANCREATOGRAPHY (ERCP);  Surgeon: Rogene [place], MD;  Location: [facility];  Service: Endoscopy;  Laterality: N/A;  . ESOPHAGOGASTRODUODENOSCOPY N/A 06/12/2015   Procedure: DIAGNOSTIC ESOPHAGOGASTRODUODENOSCOPY (EGD);  Surgeon: Rogene [place], MD;  Location: [facility];  Service: Endoscopy;  Laterality: N/A;  . STENT REMOVAL  06/12/2015   Procedure: STENT REMOVAL ;  Surgeon: Rogene [place], MD;  Location: [facility];  Service: Endoscopy;;     Allergies:  No Known Allergies   Social History:  reports that she quit smoking about 6 years ago. She has a 3.00 pack-year smoking history. She has never used smokeless tobacco. She reports that she does not drink alcohol or use drugs.   Family History: Family History  Problem Relation Age of Onset  . Heart attack Father   . Stroke Father   . Breast cancer Sister   . Thyroid disease Neg Hx   . Colon cancer Neg Hx       Procedures/Significant Events:  2/23 CT renal  stone study; Expansile lytic lesions are present within the sacrum including a 4.2 cm lesion in the right sacral ala which extends into the right SI joint and a smaller infiltrating lytic focus in the left sacral ala measuring up to 2.4 cm (5/63) and a separate focus more posteriorly measuring 1.9 cm (5/69, heterogeneous marrow elsewhere in the sacrum is conspicuous as well and worrisome for additional site of osseous  involvement. -Stable appearance of an L2 superior endplate compression deformity prior T12 compression deformity with post vertebroplasty changes. -Biliary ductal dilatation, greater than expected for age and reservoir effect. Consider liver serologies and if elevated MRCP could be obtained. 3/4 CXR;New compression fracture of T10 since 06/11/2015. No acute cardiopulmonary abnormalities. 3/4 DG femur RIGHT;Displaced spiral fracture of the distal femoral diaphysis with valgus angulation and external rotation of the lower fracture fragment    I have personally reviewed and interpreted all radiology studies and my findings are as above.     Continuous Infusions: . sodium chloride 125 mL/hr at 05/13/19 0618  . methocarbamol (ROBAXIN) IV       Physical Exam: Vitals:   05/12/19 2100 05/13/19 0500 05/13/19 0804 05/13/19 0900  BP: 132/60 136/62  (!) 113/53  Pulse: 99 99  (!) 103  Resp: '16 16 17 15  '$ Temp: 98.7 F (37.1 C) 98.5 F (36.9 C)  99 F (37.2 C)  TempSrc: Oral Oral  Oral  SpO2: 97% 98%  98%  Weight:      Height:       Physical Exam:  General: A/O x4 no acute respiratory distress Eyes: negative scleral hemorrhage, negative anisocoria, negative icterus ENT: Negative Runny nose, negative gingival bleeding, Neck:  Negative scars, masses, torticollis, lymphadenopathy, JVD Lungs: Clear to auscultation bilaterally without wheezes or crackles Cardiovascular: Regular rate and rhythm without murmur gallop or rub normal S1 and S2 Abdomen: negative abdominal pain, nondistended, positive soft, bowel sounds, no rebound, no ascites, no appreciable mass Extremities: POSITIVE swelling RIGHT thigh decreased pain to palpation  Skin: Negative rashes, lesions, ulcers Psychiatric:  Negative depression, negative anxiety, negative fatigue, negative mania  Central nervous system:  Cranial nerves II through XII intact, tongue/uvula midline, all extremities muscle strength 5/5, sensation intact  throughout, negative dysarthria, negative expressive aphasia, negative receptive aphasia.   Data reviewed:  I have personally reviewed following labs and imaging studies Labs:  CBC: Recent Labs  Lab 05/08/19 1421 05/08/19 1421 05/10/19 1459 05/10/19 1459 05/11/19 0503 05/11/19 2132 05/12/19 0452 05/12/19 1224 05/13/19 0407  WBC 4.8  --  6.2  --  4.1  --   --   --  3.6*  NEUTROABS 2.7  --  4.8  --  2.5  --   --   --   --   HGB 8.5*   < > 6.9*   < > 10.4* 7.4* 7.1* 7.5* 7.0*  HCT 27.8*   < > 21.9*   < > 31.8* 23.1* 22.3* 23.1* 22.5*  MCV 90.8  --  88.3  --  86.6  --   --   --  89.3  PLT 184  --  218  --  154  --   --   --  140*   < > = values in this interval not displayed.    Basic Metabolic Panel: Recent Labs  Lab 05/08/19 1421 05/08/19 1421 05/10/19 1459 05/10/19 1459 05/11/19 0503 05/11/19 0503 05/12/19 0602 05/13/19 0407  NA 135  --  135  --  136  --  136  137  K 3.9   < > 3.7   < > 3.8   < > 3.7 3.4*  CL 98  --  96*  --  99  --  101 100  CO2 29  --  29  --  29  --  28 28  GLUCOSE 90  --  145*  --  104*  --  93 85  BUN 27*  --  26*  --  21  --  18 14  CREATININE 0.74  --  0.62  --  0.68  --  0.70 0.60  CALCIUM 8.8*  --  8.5*  --  8.4*  --  8.0* 7.4*  MG  --   --   --   --  1.9  --  1.7 2.2   < > = values in this interval not displayed.   GFR Estimated Creatinine Clearance: 46.6 mL/min (by C-G formula based on SCr of 0.6 mg/dL). Liver Function Tests: Recent Labs  Lab 05/08/19 1421 05/11/19 0503 05/12/19 0602 05/13/19 0407  AST 16 15 14* 16  ALT '16 11 9 9  '$ ALKPHOS 83 67 47 56  BILITOT 0.5 0.5 0.4 0.5  PROT 6.7 5.1* 4.3* 4.2*  ALBUMIN 3.6 2.6* 2.3* 2.0*   No results for input(s): LIPASE, AMYLASE in the last 168 hours. No results for input(s): AMMONIA in the last 168 hours. Coagulation profile Recent Labs  Lab 05/10/19 1459 05/11/19 0503  INR 1.0 1.0    Cardiac Enzymes: Recent Labs  Lab 05/10/19 1459  CKTOTAL 109   BNP: Invalid input(s):  POCBNP CBG: No results for input(s): GLUCAP in the last 168 hours. D-Dimer No results for input(s): DDIMER in the last 72 hours. Hgb A1c No results for input(s): HGBA1C in the last 72 hours. Lipid Profile No results for input(s): CHOL, HDL, LDLCALC, TRIG, CHOLHDL, LDLDIRECT in the last 72 hours. Thyroid function studies Recent Labs    05/10/19 1622  TSH 3.046   Anemia work up No results for input(s): VITAMINB12, FOLATE, FERRITIN, TIBC, IRON, RETICCTPCT in the last 72 hours. Urinalysis    Component Value Date/Time   COLORURINE YELLOW 05/11/2019 [place] 05/11/2019 0819   LABSPEC 1.021 05/11/2019 0819   PHURINE 5.0 05/11/2019 0819   GLUCOSEU NEGATIVE 05/11/2019 0819   HGBUR MODERATE (A) 05/11/2019 0819   BILIRUBINUR NEGATIVE 05/11/2019 0819   KETONESUR NEGATIVE 05/11/2019 0819   PROTEINUR NEGATIVE 05/11/2019 0819   UROBILINOGEN 0.2 10/02/2008 0830   NITRITE NEGATIVE 05/11/2019 0819   LEUKOCYTESUR NEGATIVE 05/11/2019 0819     Microbiology Recent Results (from the past 240 hour(s))  Respiratory Panel by RT PCR (Flu A&B, Covid) - Nasopharyngeal Swab     Status: None   Collection Time: 05/10/19  3:50 PM   Specimen: Nasopharyngeal Swab  Result Value Ref Range Status   SARS Coronavirus 2 by RT PCR NEGATIVE NEGATIVE Final    Comment: (NOTE) SARS-CoV-2 target nucleic acids are NOT DETECTED. The SARS-CoV-2 RNA is generally detectable in upper respiratoy specimens during the acute phase of infection. The lowest concentration of SARS-CoV-2 viral copies this assay can detect is 131 copies/mL. A negative result does not preclude SARS-Cov-2 infection and should not be used as the sole basis for treatment or other patient management decisions. A negative result may occur with  improper specimen collection/handling, submission of specimen other than nasopharyngeal swab, presence of viral mutation(s) within the areas targeted by this assay, and inadequate number of  viral copies (<131 copies/mL).  A negative result must be combined with clinical observations, patient history, and epidemiological information. The expected result is Negative. Fact Sheet for Patients:  PinkCheek.be Fact Sheet for Healthcare Providers:  GravelBags.it This test is not yet ap proved or cleared by the [place] FDA and  has been authorized for detection and/or diagnosis of SARS-CoV-2 by FDA under an Emergency Use Authorization (EUA). This EUA will remain  in effect (meaning this test can be used) for the duration of the COVID-19 declaration under Section 564(b)(1) of the Act, 21 U.S.C. section 360bbb-3(b)(1), unless the authorization is terminated or revoked sooner.    Influenza A by PCR NEGATIVE NEGATIVE Final   Influenza B by PCR NEGATIVE NEGATIVE Final    Comment: (NOTE) The Xpert Xpress SARS-CoV-2/FLU/RSV assay is intended as an aid in  the diagnosis of influenza from Nasopharyngeal swab specimens and  should not be used as a sole basis for treatment. Nasal washings and  aspirates are unacceptable for Xpert Xpress SARS-CoV-2/FLU/RSV  testing. Fact Sheet for Patients: PinkCheek.be Fact Sheet for Healthcare Providers: GravelBags.it This test is not yet approved or cleared by the [place] FDA and  has been authorized for detection and/or diagnosis of SARS-CoV-2 by  FDA under an Emergency Use Authorization (EUA). This EUA will remain  in effect (meaning this test can be used) for the duration of the  Covid-19 declaration under Section 564(b)(1) of the Act, 21  U.S.C. section 360bbb-3(b)(1), unless the authorization is  terminated or revoked. Performed at [facility], [place] [address] [place], [place] [address]   MRSA PCR Screening     Status: None   Collection Time: 05/10/19  9:57 PM   Specimen: Nasal Mucosa; Nasopharyngeal  Result  Value Ref Range Status   MRSA by PCR NEGATIVE NEGATIVE Final    Comment:        The GeneXpert MRSA Assay (FDA approved for NASAL specimens only), is one component of a comprehensive MRSA colonization surveillance program. It is not intended to diagnose MRSA infection nor to guide or monitor treatment for MRSA infections. Performed at [facility], [place] [address] [place], [place] [address]        Inpatient Medications:   Scheduled Meds: . acetaminophen  1,000 mg Oral Q6H  . ALPRAZolam  0.5 mg Oral QHS  . Chlorhexidine Gluconate Cloth  6 each Topical Daily  . levothyroxine  100 mcg Oral Q0600  . polyvinyl alcohol  1 drop Both Eyes QID   Continuous Infusions: . sodium chloride 125 mL/hr at 05/13/19 0618  . methocarbamol (ROBAXIN) IV       Radiological Exams on Admission: No results found.  Impression/Recommendations Active Problems:   TOBACCO ABUSE   Compression fracture of L2 lumbar vertebra (HCC)   Closed fracture of right femur (HCC)   Hypothyroidism   Unspecified atrial fibrillation (HCC)   Anxiety   Anemia, unspecified   Sacral lytic lesions  Closed fracture RIGHT femur -Repair per orthopedic surgery. -Most likely site of significant loss of blood.  Per patient and daughter no history of anemia, and colonoscopy 2018 was clear.  Negative history of gastric ulcers. -Although patient has some symptoms that could be consistent with multiple myeloma patient should be low risk for surgery, given negative cardiac history (previous past history of A. fib not treated), or respiratory history. -3/7 will contact Dr. Ellene Route on 3/8 and request that he review 2/23 CT renal stone study to review pathology and back.  Compression fracture L2 lumbar  vertebrae/Sacral lytic lesions -Patient has multiple sacral lytic lesions and new L2 lumbar vertebrae fracture, which may have been the cause/contributed to patient's initial fall.  Patient was seeing Dr. Ellene Route neurosurgery  for work-up.  If patient is to be in hospital for several days post femur repair recommend consulting Dr. Ellene Route -Lesions could be consistent with multiple myeloma -UPEP pending -SPEP pending -Kappa/lambda free light chain pending  Unspecified atrial fibrillation -Currently NSR -Not on home medication for A. fib. -Would monitor closely given stress of current situation though patient has not been in A. fib may cause her to lapse into atrial fibrillation. -3/7 transfuse 1 unit PRBC -Transfuse for hemoglobin<7  Anemia -See closed fracture -3/4 fecal occult blood negative   Hypothyroidism -Synthroid 100 mcg daily  Hypomagnesmia -Magnesium goal> 2  Hypokalemia -Potassium goal> 4 -K-Dur 40 mEq x 2  Anxiety -Xanax 0.5 mg qhs    Thank you for this consultation.  Our [facility] hospitalist team will follow the patient with you.   Time Spent: 40-minute  [place], Geraldo Docker M.D. [facility] 05/13/2019, 3:00 PM  [phone]

## 2019-05-14 ENCOUNTER — Encounter: Payer: Self-pay | Admitting: *Deleted

## 2019-05-14 ENCOUNTER — Inpatient Hospital Stay (HOSPITAL_COMMUNITY): Payer: Medicare Other

## 2019-05-14 DIAGNOSIS — S72001P Fracture of unspecified part of neck of right femur, subsequent encounter for closed fracture with malunion: Secondary | ICD-10-CM

## 2019-05-14 DIAGNOSIS — E039 Hypothyroidism, unspecified: Secondary | ICD-10-CM

## 2019-05-14 LAB — BPAM RBC
Blood Product Expiration Date: 202104032359
Blood Product Expiration Date: 202104032359
Blood Product Expiration Date: 202104032359
Blood Product Expiration Date: 202104032359
ISSUE DATE / TIME: 202103041742
ISSUE DATE / TIME: 202103042013
ISSUE DATE / TIME: 202103071740
Unit Type and Rh: 5100
Unit Type and Rh: 5100
Unit Type and Rh: 5100
Unit Type and Rh: 5100

## 2019-05-14 LAB — COMPREHENSIVE METABOLIC PANEL
ALT: 9 U/L (ref 0–44)
AST: 19 U/L (ref 15–41)
Albumin: 2.2 g/dL — ABNORMAL LOW (ref 3.5–5.0)
Alkaline Phosphatase: 71 U/L (ref 38–126)
Anion gap: 8 (ref 5–15)
BUN: 12 mg/dL (ref 8–23)
CO2: 28 mmol/L (ref 22–32)
Calcium: 7.8 mg/dL — ABNORMAL LOW (ref 8.9–10.3)
Chloride: 102 mmol/L (ref 98–111)
Creatinine, Ser: 0.58 mg/dL (ref 0.44–1.00)
GFR calc Af Amer: >60 mL/min (ref >60)
GFR calc non Af Amer: >60 mL/min (ref >60)
Glucose, Bld: 94 mg/dL (ref 70–99)
Potassium: 4.1 mmol/L (ref 3.5–5.1)
Sodium: 138 mmol/L (ref 135–145)
Total Bilirubin: 1.1 mg/dL (ref 0.3–1.2)
Total Protein: 4.6 g/dL — ABNORMAL LOW (ref 6.5–8.1)

## 2019-05-14 LAB — CBC
HCT: 29.3 % — ABNORMAL LOW (ref 36.0–46.0)
Hemoglobin: 9.5 g/dL — ABNORMAL LOW (ref 12.0–15.0)
MCH: 29 pg (ref 26.0–34.0)
MCHC: 32.4 g/dL (ref 30.0–36.0)
MCV: 89.3 fL (ref 80.0–100.0)
Platelets: 166 10*3/uL (ref 150–400)
RBC: 3.28 MIL/uL — ABNORMAL LOW (ref 3.87–5.11)
RDW: 14.6 % (ref 11.5–15.5)
WBC: 4.9 10*3/uL (ref 4.0–10.5)
nRBC: 0 % (ref 0.0–0.2)

## 2019-05-14 LAB — TYPE AND SCREEN
ABO/RH(D): O POS
Antibody Screen: NEGATIVE
Unit division: 0
Unit division: 0
Unit division: 0
Unit division: 0

## 2019-05-14 LAB — KAPPA/LAMBDA LIGHT CHAINS
Kappa free light chain: 30.4 mg/L — ABNORMAL HIGH (ref 3.3–19.4)
Kappa, lambda light chain ratio: 2.6 — ABNORMAL HIGH (ref 0.26–1.65)
Lambda free light chains: 11.7 mg/L (ref 5.7–26.3)

## 2019-05-14 LAB — PROTEIN ELECTROPHORESIS, SERUM
A/G Ratio: 1.2 (ref 0.7–1.7)
Albumin ELP: 2.9 g/dL (ref 2.9–4.4)
Alpha-1-Globulin: 0.4 g/dL (ref 0.0–0.4)
Alpha-2-Globulin: 0.7 g/dL (ref 0.4–1.0)
Beta Globulin: 1.1 g/dL (ref 0.7–1.3)
Gamma Globulin: 0.3 g/dL — ABNORMAL LOW (ref 0.4–1.8)
Globulin, Total: 2.5 g/dL (ref 2.2–3.9)
Total Protein ELP: 5.4 g/dL — ABNORMAL LOW (ref 6.0–8.5)

## 2019-05-14 LAB — MAGNESIUM: Magnesium: 1.9 mg/dL (ref 1.7–2.4)

## 2019-05-14 MED ORDER — GADOBUTROL 1 MMOL/ML IV SOLN
5.0000 mL | Freq: Once | INTRAVENOUS | Status: AC | PRN
  Administered 2019-05-14: 5 mL via INTRAVENOUS

## 2019-05-14 MED ORDER — OXYCODONE HCL 5 MG PO TABS: 5.0000 mg | ORAL_TABLET | Freq: Four times a day (QID) | ORAL | 0 refills | Status: DC | PRN

## 2019-05-14 MED ORDER — VITAMIN D 125 MCG (5000 UT) PO CAPS: 5000.0000 [IU] | ORAL_CAPSULE | Freq: Every day | ORAL | 1 refills | Status: AC

## 2019-05-14 MED ORDER — ENOXAPARIN SODIUM 40 MG/0.4ML ~~LOC~~ SOLN: 40.0000 mg | SUBCUTANEOUS | 0 refills | Status: DC

## 2019-05-14 MED ORDER — VITAMIN D 25 MCG (1000 UNIT) PO TABS
2000.0000 [IU] | ORAL_TABLET | Freq: Two times a day (BID) | ORAL | Status: DC
  Administered 2019-05-14 – 2019-05-21 (×12): 2000 [IU] via ORAL
  Filled 2019-05-14 (×14): qty 2

## 2019-05-14 MED ORDER — ENOXAPARIN SODIUM 40 MG/0.4ML ~~LOC~~ SOLN
40.0000 mg | SUBCUTANEOUS | Status: DC
  Administered 2019-05-15 – 2019-05-16 (×2): 40 mg via SUBCUTANEOUS
  Filled 2019-05-14 (×2): qty 0.4

## 2019-05-14 NOTE — Progress Notes (Signed)
Occupational Therapy Treatment Patient Details Name: Courtney Arnold MRN: [id number] DOB: 06-29-1940 Today's Date: 05/14/2019    History of present illness Patient is a 79 y/o female presenting to the ED on 05/10/19 after a fall at home with R femur fx. S/p IM nail of R femur on 05/11/19 with patient currently WBAT. Patient with a PMH significant for AFib, T12 kyphoplasty, HTN, recent lytic lesions found on sacrum with continued work-up.    OT comments  Pt making progress with functional goals. Session focused on LB selfcare, toilet transfers to [facility], toileting and bed mobility to return to supine. OT will continue to follow acutely  Follow Up Recommendations  SNF;Supervision/Assistance - 24 hour    Equipment Recommendations  Other (comment)(TBD at next venue of care)    Recommendations for Other Services      Precautions / Restrictions Precautions Precautions: Fall Precaution Comments: posterior lean when backing up to bed Restrictions Weight Bearing Restrictions: Yes RLE Weight Bearing: Weight bearing as tolerated       Mobility Bed Mobility Overal bed mobility: Needs Assistance Bed Mobility: Sit to Supine       Sit to supine: Max assist   General bed mobility comments: max A with LEs onto bed  Transfers Overall transfer level: Needs assistance Equipment used: Rolling walker (2 wheeled) Transfers: Sit to/from Stand Sit to Stand: Mod assist Stand pivot transfers: Mod assist;Min assist       General transfer comment: Mod assist to power up    Balance Overall balance assessment: Needs assistance Sitting-balance support: Bilateral upper extremity supported;Feet supported Sitting balance-Leahy Scale: Fair     Standing balance support: Bilateral upper extremity supported;During functional activity Standing balance-Leahy Scale: Poor                             ADL either performed or assessed with clinical judgement   ADL Overall ADL's : Needs  assistance/impaired             Lower Body Bathing: Moderate assistance;Sitting/lateral leans           Toilet Transfer: Moderate assistance;Minimal assistance;RW;Stand-pivot;BSC;Cueing for safety;Cueing for sequencing   Toileting- Clothing Manipulation and Hygiene: Maximal assistance       Functional mobility during ADLs: Moderate assistance;Minimal assistance;Rolling walker;Cueing for safety;Cueing for sequencing       Vision Baseline Vision/History: Wears glasses Patient Visual Report: No change from baseline     Perception     Praxis      Cognition Arousal/Alertness: Awake/alert Behavior During Therapy: WFL for tasks assessed/performed Overall Cognitive Status: Within Functional Limits for tasks assessed                                          Exercises     Shoulder Instructions       General Comments      Pertinent Vitals/ Pain       Pain Assessment: Faces Faces Pain Scale: Hurts even more Pain Location: R LE with mobility Pain Descriptors / Indicators: Aching;Discomfort;Grimacing;Guarding Pain Intervention(s): Limited activity within patient's tolerance;Monitored during session;Repositioned;Ice applied  Home Living                                          Prior Functioning/Environment  Frequency  Min 2X/week        Progress Toward Goals  OT Goals(current goals can now be found in the care plan section)  Progress towards OT goals: Progressing toward goals  Acute Rehab OT Goals Patient Stated Goal: reduce pain; go home  Plan Discharge plan remains appropriate    Co-evaluation                 AM-PAC OT "6 Clicks" Daily Activity     Outcome Measure   Help from another person eating meals?: None Help from another person taking care of personal grooming?: A Little Help from another person toileting, which includes using toliet, bedpan, or urinal?: A Lot Help from another  person bathing (including washing, rinsing, drying)?: A Lot Help from another person to put on and taking off regular upper body clothing?: A Little Help from another person to put on and taking off regular lower body clothing?: A Lot 6 Click Score: 16    End of Session Equipment Utilized During Treatment: Gait belt;Rolling walker;Other (comment)(BSC)  OT Visit Diagnosis: Muscle weakness (generalized) (M62.81);History of falling (Z91.81);Pain;Unsteadiness on feet (R26.81) Pain - Right/Left: Right Pain - part of body: Leg   Activity Tolerance Patient limited by pain;Patient limited by fatigue   Patient Left with call bell/phone within reach;in bed;with bed alarm set   Nurse Communication          Time: [id number] OT Time Calculation (min): 21 min  Charges: OT General Charges $OT Visit: 1 Visit OT Treatments $Self Care/Home Management : 8-22 mins     Britt Bottom 05/14/2019, 2:35 PM

## 2019-05-14 NOTE — Consult Note (Addendum)
Reason for Consult: Back pain and right leg pain Referring Physician: Dr. Dia Crawford  Courtney Arnold is an 79 y.o. female.  HPI: Courtney Arnold is a 79 year old individual whom I have treated in the past for compression fractures of the thoracic spine.  Courtney Arnold has had 2 separate acrylic balloon kyphoplasty's and in fact when I had seen Courtney Arnold back and then I had noted Courtney Arnold rather severe proptosis and suggested that Courtney Arnold might have Graves' disease which was subsequently diagnosed by Courtney Arnold primary care physician.  Courtney Arnold has been having back and right lower extremity pain and a CT scan of the abdomen was performed back in February.  The study was reviewed and demonstrates the presence of a lytic lesion in the right portion of Courtney Arnold sacrum in addition to an L4 compression fracture of undetermined age or origin.  Courtney Arnold recently has had a femoral fracture that has been stabilized.  Lytic lesion is concerning for the possibility of multiple myeloma and Dr. Sherral Hammers is sent off a serum and urine protein electrophoresis.  Currently the patient notes that Courtney Arnold is not having in any pain and Courtney Arnold seems to be resting rather comfortably in bed.  Strength in Courtney Arnold lower extremities as tested by confrontational testing is good in both lower extremities.  Past Medical History:  Diagnosis Date  . Atrial fibrillation ([place]) 2011   Postop, spontaneous conversion to normal sinus after one hour  . Compression fracture 07/24/09   T12; kyphoplasty  . History of echocardiogram 5/11   EF 65%  . Hypertension   . Thyroid disease   . Tobacco abuse     Past Surgical History:  Procedure Laterality Date  . BACK SURGERY    . BACK SURGERY  06/06/2015  . BREAST EXCISIONAL BIOPSY Left    50 years ago  benign  . CHOLECYSTECTOMY N/A 04/25/2015   Procedure: LAPAROSCOPIC CHOLECYSTECTOMY WITH INTRAOPERATIVE CHOLANGIOGRAM;  Surgeon: Mickeal Skinner, MD;  Location: [facility];  Service: General;  Laterality: N/A;  . COLONOSCOPY N/A 11/26/2015   Procedure: COLONOSCOPY;  Surgeon: Daneil Dolin, MD;  Location: [facility];  Service: Endoscopy;  Laterality: N/A;  7:30 am  . ERCP N/A 04/14/2015   Procedure: ENDOSCOPIC RETROGRADE CHOLANGIOPANCREATOGRAPHY (ERCP) Biliary Sphincterotomy, 10x7 stent placement Dilated bilary system just not well seen;  Surgeon: Rogene Houston, MD;  Location: [facility];  Service: Endoscopy;  Laterality: N/A;  . ERCP N/A 06/12/2015   Procedure: ENDOSCOPIC RETROGRADE CHOLANGIOPANCREATOGRAPHY (ERCP);  Surgeon: Rogene Houston, MD;  [facility];  Service: Endoscopy;  Laterality: N/A;  . ESOPHAGOGASTRODUODENOSCOPY N/A 06/12/2015   Procedure: DIAGNOSTIC ESOPHAGOGASTRODUODENOSCOPY (EGD);  Surgeon: Rogene Houston, MD;  [facility];  Service: Endoscopy;  Laterality: N/A;  . FEMUR IM NAIL Right 05/11/2019   Procedure: RETROGRADE INTRAMEDULLARY NAIL FEMORAL;  Surgeon: Shona Needles, MD;  Location: [facility];  Service: Orthopedics;  Laterality: Right;  . STENT REMOVAL  06/12/2015   Procedure: STENT REMOVAL ;  Surgeon: Rogene Houston, MD;  [facility]: [facility];  Service: Endoscopy;;    Family History  Problem Relation Age of Onset  . Heart attack Father   . Stroke Father   . Breast cancer Sister   . Thyroid disease Neg Hx   . Colon cancer Neg Hx     Social History:  reports that Courtney Arnold quit smoking about 6 years ago. Courtney Arnold has a 3.00 pack-year smoking history. Courtney Arnold has never used smokeless tobacco. Courtney Arnold reports that Courtney Arnold does not  drink alcohol or use drugs.  Allergies: No Known Allergies  Medications: I have reviewed the patient's current medications.  Results for orders placed or performed during the hospital encounter of 05/10/19 (from the past 48 hour(s))  Comprehensive metabolic panel     Status: Abnormal   Collection Time: 05/13/19  4:07 AM  Result Value Ref Range   Sodium 137 135 - 145 mmol/L   Potassium 3.4 (L) 3.5 - 5.1 mmol/L   Chloride 100 98 - 111 mmol/L   CO2 28 22 - 32 mmol/L   Glucose,  Bld 85 70 - 99 mg/dL    Comment: Glucose reference range applies only to samples taken after fasting for at least 8 hours.   BUN 14 8 - 23 mg/dL   Creatinine, Ser 0.60 0.44 - 1.00 mg/dL   Calcium 7.4 (L) 8.9 - 10.3 mg/dL   Total Protein 4.2 (L) 6.5 - 8.1 g/dL   Albumin 2.0 (L) 3.5 - 5.0 g/dL   AST 16 15 - 41 U/L   ALT 9 0 - 44 U/L   Alkaline Phosphatase 56 38 - 126 U/L   Total Bilirubin 0.5 0.3 - 1.2 mg/dL   GFR calc non Af Amer >60 >60 mL/min   GFR calc Af Amer >60 >60 mL/min   Anion gap 9 5 - 15    Comment: Performed at [facility] [address] [place], [place] [address]  Magnesium     Status: None   Collection Time: 05/13/19  4:07 AM  Result Value Ref Range   Magnesium 2.2 1.7 - 2.4 mg/dL    Comment: Performed at [facility] [address], [place], [place] [address]  CBC     Status: Abnormal   Collection Time: 05/13/19  4:07 AM  Result Value Ref Range   WBC 3.6 (L) 4.0 - 10.5 K/uL   RBC 2.52 (L) 3.87 - 5.11 MIL/uL   Hemoglobin 7.0 (L) 12.0 - 15.0 g/dL   HCT 22.5 (L) 36.0 - 46.0 %   MCV 89.3 80.0 - 100.0 fL   MCH 27.8 26.0 - 34.0 pg   MCHC 31.1 30.0 - 36.0 g/dL   RDW 15.3 11.5 - 15.5 %   Platelets 140 (L) 150 - 400 K/uL   nRBC 0.0 0.0 - 0.2 %    Comment: Performed at [facility], [place] [address] [place], [place] [address]  Prepare RBC     Status: None   Collection Time: 05/13/19  3:06 PM  Result Value Ref Range   Order Confirmation      BB SAMPLE OR UNITS ALREADY AVAILABLE Performed at [facility], [place] [address], [place], [place] [address]   Type and screen Why     Status: None (Preliminary result)   Collection Time: 05/13/19  3:14 PM  Result Value Ref Range   ABO/RH(D) O POS    Antibody Screen NEG    Sample Expiration      05/16/2019,2359 Performed at [facility], [place] [address] [place], [place] [address]    Unit Number [id number]    Blood Component Type RED CELLS,LR    Unit division 00     Status of Unit ALLOCATED    Transfusion Status OK TO TRANSFUSE    Crossmatch Result Compatible   Comprehensive metabolic panel     Status: Abnormal   Collection Time: 05/14/19  2:51 AM  Result Value Ref Range   Sodium 138 135 -  145 mmol/L   Potassium 4.1 3.5 - 5.1 mmol/L    Comment: DELTA CHECK NOTED   Chloride 102 98 - 111 mmol/L   CO2 28 22 - 32 mmol/L   Glucose, Bld 94 70 - 99 mg/dL    Comment: Glucose reference range applies only to samples taken after fasting for at least 8 hours.   BUN 12 8 - 23 mg/dL   Creatinine, Ser 0.58 0.44 - 1.00 mg/dL   Calcium 7.8 (L) 8.9 - 10.3 mg/dL   Total Protein 4.6 (L) 6.5 - 8.1 g/dL   Albumin 2.2 (L) 3.5 - 5.0 g/dL   AST 19 15 - 41 U/L   ALT 9 0 - 44 U/L   Alkaline Phosphatase 71 38 - 126 U/L   Total Bilirubin 1.1 0.3 - 1.2 mg/dL   GFR calc non Af Amer >60 >60 mL/min   GFR calc Af Amer >60 >60 mL/min   Anion gap 8 5 - 15    Comment: Performed at [facility] [address] [place], [place] [address]  Magnesium     Status: None   Collection Time: 05/14/19  2:51 AM  Result Value Ref Range   Magnesium 1.9 1.7 - 2.4 mg/dL    Comment: Performed at [facility] [address] [place], [place] [address]  CBC     Status: Abnormal   Collection Time: 05/14/19  2:51 AM  Result Value Ref Range   WBC 4.9 4.0 - 10.5 K/uL   RBC 3.28 (L) 3.87 - 5.11 MIL/uL   Hemoglobin 9.5 (L) 12.0 - 15.0 g/dL    Comment: REPEATED TO VERIFY POST TRANSFUSION SPECIMEN    HCT 29.3 (L) 36.0 - 46.0 %   MCV 89.3 80.0 - 100.0 fL   MCH 29.0 26.0 - 34.0 pg   MCHC 32.4 30.0 - 36.0 g/dL   RDW 14.6 11.5 - 15.5 %   Platelets 166 150 - 400 K/uL   nRBC 0.0 0.0 - 0.2 %    Comment: Performed at [facility], [place] [address], [place], [place] [address]    No results found.  Review of Systems  Constitutional: Negative.   HENT: Negative.   Eyes: Negative.   Respiratory: Negative.   Cardiovascular: Negative.   Gastrointestinal: Negative.   Endocrine:        Graves' disease  Musculoskeletal: Positive for back pain and gait problem.  Neurological: Positive for weakness and light-headedness.   Blood pressure 137/65, pulse 98, temperature 98.5 F (36.9 C), resp. rate 16, height 5' 2"  (1.575 m), weight 54.4 kg, SpO2 98 %. Physical Exam  Constitutional: Courtney Arnold is oriented to person, place, and time.  Frail-appearing  Eyes: Pupils are equal, round, and reactive to light. Conjunctivae and EOM are normal.  Musculoskeletal:     Cervical back: Normal range of motion and neck supple.  Neurological: Courtney Arnold is alert and oriented to person, place, and time.  Good movement of the lower extremities in the upper extremities to confrontational testing.  Absent reflexes in the patella and the Achilles.  Sensation appears grossly intact in lower extremities.  Skin: Skin is warm and dry.  Psychiatric: Courtney Arnold has a normal mood and affect. Courtney Arnold behavior is normal.    Assessment/Plan: Lytic lesion of the sacrum   Plan Best work-up would include an MRI of the lumbar spine and the sacrum to see if there is any other lesions and also to help determine the age of Courtney Arnold L4 compression  fracture.  This does appear to be fairly stable and may very well be old.  Blanchie Dessert Josseline Reddin 05/14/2019, 3:44 PM

## 2019-05-14 NOTE — Progress Notes (Signed)
Physical Therapy Note  SATURATION QUALIFICATIONS: (This note is used to comply with regulatory documentation for home oxygen)  Patient Saturations on Room Air at Rest = 90%  Patient Saturations on Room Air while Ambulating = 81%  Patient Saturations on 2.5 Liters of oxygen while Ambulating = 93%  Please briefly explain why patient needs home oxygen: Patient requires supplemental oxygen to maintain oxygen saturations at acceptable, safe levels with physical activity.   See full PT treatment note of this date for more details  Roney Marion, [place] Pager [phone] Office [phone]

## 2019-05-14 NOTE — Progress Notes (Signed)
Physical Therapy Treatment Patient Details Name: Courtney Arnold MRN: [id number] DOB: Aug 17, 1940 Today's Date: 05/14/2019    History of Present Illness Patient is a 79 y/o female presenting to the ED on 05/10/19 after a fall at home with R femur fx. S/p IM nail of R femur on 05/11/19 with patient currently WBAT. Patient with a PMH significant for AFib, T12 kyphoplasty, HTN, recent lytic lesions found on sacrum with continued work-up.     PT Comments    Continuing work on functional mobility and activity tolerance;  Pain better managed today, and Ms. Vorhees was able to initiate gait, using RW to [place] painful RLE in stance; Notable that her O2 sats dropped with activity on Room Air, requiring replacement of supplemental O2; see other PT note of this date for more detailed  supplemental O2  info  Follow Up Recommendations  SNF;Supervision/Assistance - 24 hour     Equipment Recommendations  Rolling walker with 5" wheels    Recommendations for Other Services       Precautions / Restrictions Precautions Precautions: Fall Precaution Comments: heavy posterior lean at times Restrictions Weight Bearing Restrictions: Yes RLE Weight Bearing: Weight bearing as tolerated    Mobility  Bed Mobility                  Transfers Overall transfer level: Needs assistance Equipment used: Rolling walker (2 wheeled) Transfers: Sit to/from Stand Sit to Stand: Mod assist         General transfer comment: Mod assist to power up and steady; heavy posterior lean at initial stand, both standing from recliner and standing from [facility]  Ambulation/Gait Ambulation/Gait assistance: Mod assist;+2 safety/equipment Gait Distance (Feet): 10 Feet Assistive device: Rolling walker (2 wheeled) Gait Pattern/deviations: Step-to pattern;Decreased stance time - right     General Gait Details: Cues for sequence, and to use RW to unweigh painful RLE in stance; Mod assist due to on and off posterior lean; Notable  also that we attemtped to walk on Room Air (she tells me normally she does not need supplemental O2), and her O2 sats decr to 81%, restarted supplemental O2   Stairs             Wheelchair Mobility    Modified Rankin (Stroke Patients Only)       Balance     Sitting balance-Leahy Scale: Fair       Standing balance-Leahy Scale: Poor                              Cognition Arousal/Alertness: Awake/alert Behavior During Therapy: WFL for tasks assessed/performed Overall Cognitive Status: Within Functional Limits for tasks assessed                                        Exercises      General Comments        Pertinent Vitals/Pain Pain Assessment: Faces Faces Pain Scale: Hurts little more Pain Location: R LE with mobility Pain Descriptors / Indicators: Aching;Discomfort;Grimacing;Guarding Pain Intervention(s): Premedicated before session    Home Living                      Prior Function            PT Goals (current goals can now be found in the care plan section) Acute Rehab PT  Goals Patient Stated Goal: reduce pain; go home PT Goal Formulation: With patient Time For Goal Achievement: 05/26/19 Potential to Achieve Goals: Good Progress towards PT goals: Progressing toward goals    Frequency    Min 3X/week      PT Plan Current plan remains appropriate    Co-evaluation              AM-PAC PT "6 Clicks" Mobility   Outcome Measure  Help needed turning from your back to your side while in a flat bed without using bedrails?: A Lot Help needed moving from lying on your back to sitting on the side of a flat bed without using bedrails?: A Lot Help needed moving to and from a bed to a chair (including a wheelchair)?: A Lot Help needed standing up from a chair using your arms (e.g., wheelchair or bedside chair)?: A Lot Help needed to walk in hospital room?: A Lot Help needed climbing 3-5 steps with a railing? :  A Lot 6 Click Score: 12    End of Session Equipment Utilized During Treatment: Gait belt;Oxygen Activity Tolerance: Patient tolerated treatment well Patient left: in chair;with call bell/phone within reach;with chair alarm set Nurse Communication: Mobility status PT Visit Diagnosis: Unsteadiness on feet (R26.81);Other abnormalities of gait and mobility (R26.89);Muscle weakness (generalized) (M62.81)     Time: [id number] PT Time Calculation (min) (ACUTE ONLY): 25 min  Charges:  $Gait Training: 8-22 mins $Therapeutic Activity: 8-22 mins                     Roney Marion, PT  Acute Rehabilitation Services Pager [phone] Office [place] 05/14/2019, 12:43 PM

## 2019-05-14 NOTE — Discharge Instructions (Signed)
Orthopaedic Trauma Service Discharge Instructions   General Discharge Instructions  WEIGHT BEARING STATUS: Weightbearing as tolerated right lower extremity  RANGE OF MOTION/ACTIVITY: Okay for unrestricted motion of the knee and hip  Wound Care: Incisions can be left open to air if there is no drainage. If incision continues to have drainage, follow wound care instructions below. Okay to shower and get incisions wet  DVT/PE prophylaxis: Lovenox x 30 days  Diet: as you were eating previously.  Can use over the counter stool softeners and bowel preparations, such as Miralax, to help with bowel movements.  Narcotics can be constipating.  Be sure to drink plenty of fluids  PAIN MEDICATION USE AND EXPECTATIONS  You have likely been given narcotic medications to help control your pain.  After a traumatic event that results in an fracture (broken bone) with or without surgery, it is ok to use narcotic pain medications to help control one's pain.  We understand that everyone responds to pain differently and each individual patient will be evaluated on a regular basis for the continued need for narcotic medications. Ideally, narcotic medication use should last no more than 6-8 weeks (coinciding with fracture healing).   As a patient it is your responsibility as well to monitor narcotic medication use and report the amount and frequency you use these medications when you come to your office visit.   We would also advise that if you are using narcotic medications, you should take a dose prior to therapy to maximize you participation.  IF YOU ARE ON NARCOTIC MEDICATIONS IT IS NOT PERMISSIBLE TO OPERATE A MOTOR VEHICLE (MOTORCYCLE/CAR/TRUCK/MOPED) OR HEAVY MACHINERY DO NOT MIX NARCOTICS WITH OTHER CNS (CENTRAL NERVOUS SYSTEM) DEPRESSANTS SUCH AS ALCOHOL   STOP SMOKING OR USING NICOTINE PRODUCTS!!!!  As discussed nicotine severely impairs your body's ability to heal surgical and traumatic wounds but  also impairs bone healing.  Wounds and bone heal by forming microscopic blood vessels (angiogenesis) and nicotine is a vasoconstrictor (essentially, shrinks blood vessels).  Therefore, if vasoconstriction occurs to these microscopic blood vessels they essentially disappear and are unable to deliver necessary nutrients to the healing tissue.  This is one modifiable factor that you can do to dramatically increase your chances of healing your injury.    (This means no smoking, no nicotine gum, patches, etc)  DO NOT USE NONSTEROIDAL ANTI-INFLAMMATORY DRUGS (NSAID'S)  Using products such as Advil (ibuprofen), Aleve (naproxen), Motrin (ibuprofen) for additional pain control during fracture healing can delay and/or prevent the healing response.  If you would like to take over the counter (OTC) medication, Tylenol (acetaminophen) is ok.  However, some narcotic medications that are given for pain control contain acetaminophen as well. Therefore, you should not exceed more than 4000 mg of tylenol in a day if you do not have liver disease.  Also note that there are may OTC medicines, such as cold medicines and allergy medicines that my contain tylenol as well.  If you have any questions about medications and/or interactions please ask your doctor/PA or your pharmacist.      ICE AND ELEVATE INJURED/OPERATIVE EXTREMITY  Using ice and elevating the injured extremity above your heart can help with swelling and pain control.  Icing in a pulsatile fashion, such as 20 minutes on and 20 minutes off, can be followed.    Do not place ice directly on skin. Make sure there is a barrier between to skin and the ice pack.    Using frozen items such as  frozen peas works well as the conform nicely to the are that needs to be iced.  USE AN ACE WRAP OR TED HOSE FOR SWELLING CONTROL  In addition to icing and elevation, Ace wraps or TED hose are used to help limit and resolve swelling.  It is recommended to use Ace wraps or TED hose  until you are informed to stop.    When using Ace Wraps start the wrapping distally (farthest away from the body) and wrap proximally (closer to the body)   Example: If you had surgery on your leg or thing and you do not have a splint on, start the ace wrap at the toes and work your way up to the thigh        If you had surgery on your upper extremity and do not have a splint on, start the ace wrap at your fingers and work your way up to the upper arm   [facility]: [phone]   VISIT OUR WEBSITE FOR ADDITIONAL INFORMATION: [email or web address]    Discharge Wound Care Instructions  Do NOT apply any ointments, solutions or lotions to pin sites or surgical wounds.  These prevent needed drainage and even though solutions like hydrogen peroxide kill bacteria, they also damage cells lining the pin sites that help fight infection.  Applying lotions or ointments can keep the wounds moist and can cause them to breakdown and open up as well. This can increase the risk for infection. When in doubt call the office.  Surgical incisions should be dressed daily.  If any drainage is noted, use one layer of adaptic, then gauze, Kerlix, and an ace wrap.  Once the incision is completely dry and without drainage, it may be left open to air out.  Showering may begin 36-48 hours later.  Cleaning gently with soap and water.  Traumatic wounds should be dressed daily as well.    One layer of adaptic, gauze, Kerlix, then ace wrap.  The adaptic can be discontinued once the draining has ceased    If you have a wet to dry dressing: wet the gauze with saline the squeeze as much saline out so the gauze is moist (not soaking wet), place moistened gauze over wound, then place a dry gauze over the moist one, followed by Kerlix wrap, then ace wrap.

## 2019-05-14 NOTE — H&P (Signed)
Please see orthopaedic consult note for full H&P

## 2019-05-14 NOTE — Progress Notes (Signed)
Orthopaedic Trauma Progress Note  S: Doing okay this morning.  No specific concerns or complaints.  Denies chest pain, SOB. denies numbness/tingling in extremity. Feels warm lying in bed, wants all of her blankets and both SCDs removed.  O:  Vitals:   05/13/19 2206 05/14/19 0500  BP: 138/66 139/69  Pulse:  99  Resp:    Temp:  99.1 F (37.3 C)  SpO2:  100%    General - Laying in bed, NAD Respiratory - No increased work of breathing.  Right Lower Extremity - Incisions clean, dry, intact.  Steri-Strips in place.  No significant tenderness with palpation throughout extremity.  Able to actively flex the knee about 30 degrees without significant pain.  Ankle dorsiflexion/plantarflexion is intact.  Sensation intact to light touch distally. Compartments are soft and compressible. Foot is cool but equal contralateral side. Neurovascularly intact.  Imaging: Stable post op imaging.   Labs:  Results for orders placed or performed during the hospital encounter of 05/10/19 (from the past 24 hour(s))  Prepare RBC     Status: None   Collection Time: 05/13/19  3:06 PM  Result Value Ref Range   Order Confirmation      BB SAMPLE OR UNITS ALREADY AVAILABLE Performed at [facility], [place] [address] [place], [place] [address]   Type and screen [place]     Status: None (Preliminary result)   Collection Time: 05/13/19  3:14 PM  Result Value Ref Range   ABO/RH(D) O POS    Antibody Screen NEG    Sample Expiration      05/16/2019,2359 Performed at [facility], [place] [address], [place], [place] [address]    Unit Number [id number]    Blood Component Type RED CELLS,LR    Unit division 00    Status of Unit ALLOCATED    Transfusion Status OK TO TRANSFUSE    Crossmatch Result Compatible   Comprehensive metabolic panel     Status: Abnormal   Collection Time: 05/14/19  2:51 AM  Result Value Ref Range   Sodium 138 135 - 145 mmol/L   Potassium 4.1 3.5 - 5.1 mmol/L    Chloride 102 98 - 111 mmol/L   CO2 28 22 - 32 mmol/L   Glucose, Bld 94 70 - 99 mg/dL   BUN 12 8 - 23 mg/dL   Creatinine, Ser 0.58 0.44 - 1.00 mg/dL   Calcium 7.8 (L) 8.9 - 10.3 mg/dL   Total Protein 4.6 (L) 6.5 - 8.1 g/dL   Albumin 2.2 (L) 3.5 - 5.0 g/dL   AST 19 15 - 41 U/L   ALT 9 0 - 44 U/L   Alkaline Phosphatase 71 38 - 126 U/L   Total Bilirubin 1.1 0.3 - 1.2 mg/dL   GFR calc non Af Amer >60 >60 mL/min   GFR calc Af Amer >60 >60 mL/min   Anion gap 8 5 - 15  Magnesium     Status: None   Collection Time: 05/14/19  2:51 AM  Result Value Ref Range   Magnesium 1.9 1.7 - 2.4 mg/dL  CBC     Status: Abnormal   Collection Time: 05/14/19  2:51 AM  Result Value Ref Range   WBC 4.9 4.0 - 10.5 K/uL   RBC 3.28 (L) 3.87 - 5.11 MIL/uL   Hemoglobin 9.5 (L) 12.0 - 15.0 g/dL   HCT 29.3 (L) 36.0 - 46.0 %   MCV 89.3 80.0 - 100.0 fL   MCH 29.0  26.0 - 34.0 pg   MCHC 32.4 30.0 - 36.0 g/dL   RDW 14.6 11.5 - 15.5 %   Platelets 166 150 - 400 K/uL   nRBC 0.0 0.0 - 0.2 %    Assessment: 79 year old female status post fall, 3 Days Post-Op   Injuries: Right femoral shaft fracture status post retrograde IM nail  Weightbearing: WBAT RLE  Insicional and dressing care: Incisions can be left open to air  Showering: Okay to begin showering and getting incisions wet.  Orthopedic device(s): None   CV/Blood loss: Acute blood loss anemia, Hgb 9.5 this morning. Hemodynamically stable. Received 1 unit PRBCs 05/13/2019.   Pain management:  1. Tylenol 1000 mg q 6 hours scheduled 2. Robaxin 500 mg q 6 hours PRN 3. Oxycodone 5-10 mg q 4 hours PRN 4. Morphine 1-2 mg q 2 hours PRN  VTE prophylaxis:  Okay to start Lovenox today SCDs: Order, not currently in place  ID:  Ancef 2gm post op completed  Foley/Lines: Remove foley today.  KVO IVFs  Medical co-morbidities: atrial fibrillation, compression fracture T12 s/p kyphoplasty, HTN, Anemia, Hypothyroidism, Anxiety  Impediments to Fracture Healing: Vitamin D  level 22, start D3 supplementation  Dispo: Up with therapies as tolerated. PT/OT recommending SNF. Discharge once bed available.  Follow - up plan: 2 weeks for wound check and repeat x-rays  Contact information:  Katha Hamming MD, Patrecia Pace PA-C   [facility] [phone] (office) [email or web address]

## 2019-05-14 NOTE — Plan of Care (Signed)
  Problem: Education: Goal: Knowledge of General Education information will improve Description Including pain rating scale, medication(s)/side effects and non-pharmacologic comfort measures Outcome: Progressing   Problem: Health Behavior/Discharge Planning: Goal: Ability to manage health-related needs will improve Outcome: Progressing   Problem: Clinical Measurements: Goal: Ability to maintain clinical measurements within normal limits will improve Outcome: Progressing Goal: Will remain free from infection Outcome: Progressing   Problem: Activity: Goal: Risk for activity intolerance will decrease Outcome: Progressing   Problem: Nutrition: Goal: Adequate nutrition will be maintained Outcome: Progressing   Problem: Coping: Goal: Level of anxiety will decrease Outcome: Progressing   Problem: Elimination: Goal: Will not experience complications related to bowel motility Outcome: Progressing   Problem: Pain Managment: Goal: General experience of comfort will improve Outcome: Progressing   Problem: Safety: Goal: Ability to remain free from injury will improve Outcome: Progressing   Problem: Skin Integrity: Goal: Risk for impaired skin integrity will decrease Outcome: Progressing   

## 2019-05-14 NOTE — Consult Note (Signed)
Medical Consultation   Courtney Arnold  PVX:[id number]  DOB: 1940/10/19  DOA: 05/10/2019  PCP: [name], MD    Requesting physician: Dr Rod Can orthopedic surgery  Reason for consultation: Medical clearance   History of Present Illness: Courtney Arnold is an 79 y.o. female WF PMHx atrial fibrillation, compression fracture T12 s/p kyphoplasty, HTN, Anemia, Hypothyroidism, Anxiety.  Is being evaluated for lower back pain, bilateral lower extremity weakness, what patient describes as legs giving out causing her to fall.  Patient was due to have a PET scan next week for further evaluation by Dr. Ellene Route neurosurgery.  3/7 A/O x4, negative CP, negative S OB,    Review of Systems:  Review of Systems  Constitutional: Negative.   HENT: Negative.   Eyes: Negative.   Respiratory: Negative.   Cardiovascular: Negative.   Gastrointestinal: Negative.   Genitourinary: Negative.   Musculoskeletal: Positive for back pain, falls and joint pain. Negative for myalgias and neck pain.  Skin: Negative.   Neurological: Positive for dizziness, tingling, tremors, sensory change, speech change, seizures, loss of consciousness and headaches. Negative for focal weakness and weakness.  Endo/Heme/Allergies: Negative.   Psychiatric/Behavioral: Negative.     Past Medical History: Past Medical History:  Diagnosis Date  . Atrial fibrillation ([place]) 2011   Postop, spontaneous conversion to normal sinus after one hour  . Compression fracture 07/24/09   T12; kyphoplasty  . History of echocardiogram 5/11   EF 65%  . Hypertension   . Thyroid disease   . Tobacco abuse     Past Surgical History: Past Surgical History:  Procedure Laterality Date  . BACK SURGERY    . BACK SURGERY  06/06/2015  . BREAST EXCISIONAL BIOPSY Left    50 years ago  benign  . CHOLECYSTECTOMY N/A 04/25/2015   Procedure: LAPAROSCOPIC CHOLECYSTECTOMY WITH INTRAOPERATIVE CHOLANGIOGRAM;  Surgeon: Mickeal Skinner, MD;  Location: [facility];  Service: General;  Laterality: N/A;  . COLONOSCOPY N/A 11/26/2015   Procedure: COLONOSCOPY;  Surgeon: Daneil Dolin, MD;  Location: [facility];  Service: Endoscopy;  Laterality: N/A;  7:30 am  . ERCP N/A 04/14/2015   Procedure: ENDOSCOPIC RETROGRADE CHOLANGIOPANCREATOGRAPHY (ERCP) Biliary Sphincterotomy, 10x7 stent placement Dilated bilary system just not well seen;  Surgeon: Rogene Houston, MD;  Location: [facility];  Service: Endoscopy;  Laterality: N/A;  . ERCP N/A 06/12/2015   Procedure: ENDOSCOPIC RETROGRADE CHOLANGIOPANCREATOGRAPHY (ERCP);  Surgeon: Rogene Houston, MD;  Location: [facility];  Service: Endoscopy;  Laterality: N/A;  . ESOPHAGOGASTRODUODENOSCOPY N/A 06/12/2015   Procedure: DIAGNOSTIC ESOPHAGOGASTRODUODENOSCOPY (EGD);  Surgeon: Rogene Houston, MD;  Location: [facility];  Service: Endoscopy;  Laterality: N/A;  . STENT REMOVAL  06/12/2015   Procedure: STENT REMOVAL ;  Surgeon: Rogene Houston, MD;  Location: [facility];  Service: Endoscopy;;     Allergies:  No Known Allergies   Social History:  reports that she quit smoking about 6 years ago. She has a 3.00 pack-year smoking history. She has never used smokeless tobacco. She reports that she does not drink alcohol or use drugs.   Family History: Family History  Problem Relation Age of Onset  . Heart attack Father   . Stroke Father   . Breast cancer Sister   . Thyroid disease Neg Hx   . Colon cancer Neg Hx       Procedures/Significant Events:  2/23 CT renal  stone study; Expansile lytic lesions are present within the sacrum including a 4.2 cm lesion in the right sacral ala which extends into the right SI joint and a smaller infiltrating lytic focus in the left sacral ala measuring up to 2.4 cm (5/63) and a separate focus more posteriorly measuring 1.9 cm (5/69, heterogeneous marrow elsewhere in the sacrum is conspicuous as well and worrisome for additional site of osseous  involvement. -Stable appearance of an L2 superior endplate compression deformity prior T12 compression deformity with post vertebroplasty changes. -Biliary ductal dilatation, greater than expected for age and reservoir effect. Consider liver serologies and if elevated MRCP could be obtained. 3/4 CXR;New compression fracture of T10 since 06/11/2015. No acute cardiopulmonary abnormalities. 3/4 DG femur RIGHT;Displaced spiral fracture of the distal femoral diaphysis with valgus angulation and external rotation of the lower fracture fragment    I have personally reviewed and interpreted all radiology studies and my findings are as above.     Continuous Infusions: . sodium chloride 125 mL/hr at 05/13/19 0618  . methocarbamol (ROBAXIN) IV       Physical Exam: Vitals:   05/13/19 2130 05/13/19 2206 05/14/19 0500 05/14/19 0756  BP: (!) 130/50 138/66 139/69 (!) 128/50  Pulse:   99 97  Resp:    17  Temp: 98.5 F (36.9 C)  99.1 F (37.3 C) 98.3 F (36.8 C)  TempSrc: Oral  Oral   SpO2:   100% 100%  Weight:      Height:       Physical Exam:  General: A/O x4, no acute respiratory distress Eyes: negative scleral hemorrhage, negative anisocoria, negative icterus ENT: Negative Runny nose, negative gingival bleeding, Neck:  Negative scars, masses, torticollis, lymphadenopathy, JVD Lungs: Clear to auscultation bilaterally without wheezes or crackles Cardiovascular: Regular rate and rhythm without murmur gallop or rub normal S1 and S2 Abdomen: negative abdominal pain, nondistended, positive soft, bowel sounds, no rebound, no ascites, no appreciable mass Extremities: POSITIVE swelling RIGHT thigh appropriate for recent break in repair, negative pain to palpation positive DP/PT +1  Skin: Negative rashes, lesions, ulcers Psychiatric:  Negative depression, negative anxiety, negative fatigue, negative mania  Central nervous system:  Cranial nerves II through XII intact, tongue/uvula midline, all  extremities muscle strength 5/5, sensation intact throughout, negative dysarthria, negative expressive aphasia, negative receptive aphasia.  Data reviewed:  I have personally reviewed following labs and imaging studies Labs:  CBC: Recent Labs  Lab 05/08/19 1421 05/08/19 1421 05/10/19 1459 05/10/19 1459 05/11/19 0503 05/11/19 0503 05/11/19 2132 05/12/19 0452 05/12/19 1224 05/13/19 0407 05/14/19 0251  WBC 4.8  --  6.2  --  4.1  --   --   --   --  3.6* 4.9  NEUTROABS 2.7  --  4.8  --  2.5  --   --   --   --   --   --   HGB 8.5*   < > 6.9*   < > 10.4*   < > 7.4* 7.1* 7.5* 7.0* 9.5*  HCT 27.8*   < > 21.9*   < > 31.8*   < > 23.1* 22.3* 23.1* 22.5* 29.3*  MCV 90.8  --  88.3  --  86.6  --   --   --   --  89.3 89.3  PLT 184  --  218  --  154  --   --   --   --  140* 166   < > = values in this interval not displayed.  Basic Metabolic Panel: Recent Labs  Lab 05/10/19 1459 05/10/19 1459 05/11/19 0503 05/11/19 0503 05/12/19 0602 05/12/19 0602 05/13/19 0407 05/14/19 0251  NA 135  --  136  --  136  --  137 138  K 3.7   < > 3.8   < > 3.7   < > 3.4* 4.1  CL 96*  --  99  --  101  --  100 102  CO2 29  --  29  --  28  --  28 28  GLUCOSE 145*  --  104*  --  93  --  85 94  BUN 26*  --  21  --  18  --  14 12  CREATININE 0.62  --  0.68  --  0.70  --  0.60 0.58  CALCIUM 8.5*  --  8.4*  --  8.0*  --  7.4* 7.8*  MG  --   --  1.9  --  1.7  --  2.2 1.9   < > = values in this interval not displayed.   GFR Estimated Creatinine Clearance: 46.6 mL/min (by C-G formula based on SCr of 0.58 mg/dL). Liver Function Tests: Recent Labs  Lab 05/08/19 1421 05/11/19 0503 05/12/19 0602 05/13/19 0407 05/14/19 0251  AST 16 15 14* 16 19  ALT '16 11 9 9 9  '$ ALKPHOS 83 67 47 56 71  BILITOT 0.5 0.5 0.4 0.5 1.1  PROT 6.7 5.1* 4.3* 4.2* 4.6*  ALBUMIN 3.6 2.6* 2.3* 2.0* 2.2*   No results for input(s): LIPASE, AMYLASE in the last 168 hours. No results for input(s): AMMONIA in the last 168  hours. Coagulation profile Recent Labs  Lab 05/10/19 1459 05/11/19 0503  INR 1.0 1.0    Cardiac Enzymes: Recent Labs  Lab 05/10/19 1459  CKTOTAL 109   BNP: Invalid input(s): POCBNP CBG: No results for input(s): GLUCAP in the last 168 hours. D-Dimer No results for input(s): DDIMER in the last 72 hours. Hgb A1c No results for input(s): HGBA1C in the last 72 hours. Lipid Profile No results for input(s): CHOL, HDL, LDLCALC, TRIG, CHOLHDL, LDLDIRECT in the last 72 hours. Thyroid function studies No results for input(s): TSH, T4TOTAL, T3FREE, THYROIDAB in the last 72 hours.  Invalid input(s): FREET3 Anemia work up No results for input(s): VITAMINB12, FOLATE, FERRITIN, TIBC, IRON, RETICCTPCT in the last 72 hours. Urinalysis    Component Value Date/Time   COLORURINE YELLOW 05/11/2019 [place] 05/11/2019 0819   LABSPEC 1.021 05/11/2019 0819   PHURINE 5.0 05/11/2019 0819   GLUCOSEU NEGATIVE 05/11/2019 0819   HGBUR MODERATE (A) 05/11/2019 0819   BILIRUBINUR NEGATIVE 05/11/2019 0819   KETONESUR NEGATIVE 05/11/2019 0819   PROTEINUR NEGATIVE 05/11/2019 0819   UROBILINOGEN 0.2 10/02/2008 0830   NITRITE NEGATIVE 05/11/2019 0819   LEUKOCYTESUR NEGATIVE 05/11/2019 0819     Microbiology Recent Results (from the past 240 hour(s))  Respiratory Panel by RT PCR (Flu A&B, Covid) - Nasopharyngeal Swab     Status: None   Collection Time: 05/10/19  3:50 PM   Specimen: Nasopharyngeal Swab  Result Value Ref Range Status   SARS Coronavirus 2 by RT PCR NEGATIVE NEGATIVE Final    Comment: (NOTE) SARS-CoV-2 target nucleic acids are NOT DETECTED. The SARS-CoV-2 RNA is generally detectable in upper respiratoy specimens during the acute phase of infection. The lowest concentration of SARS-CoV-2 viral copies this assay can detect is 131 copies/mL. A negative result does not preclude SARS-Cov-2 infection and should not be  used as the sole basis for treatment or other patient  management decisions. A negative result may occur with  improper specimen collection/handling, submission of specimen other than nasopharyngeal swab, presence of viral mutation(s) within the areas targeted by this assay, and inadequate number of viral copies (<131 copies/mL). A negative result must be combined with clinical observations, patient history, and epidemiological information. The expected result is Negative. Fact Sheet for Patients:  PinkCheek.be Fact Sheet for Healthcare Providers:  GravelBags.it This test is not yet ap proved or cleared by the [place] FDA and  has been authorized for detection and/or diagnosis of SARS-CoV-2 by FDA under an Emergency Use Authorization (EUA). This EUA will remain  in effect (meaning this test can be used) for the duration of the COVID-19 declaration under Section 564(b)(1) of the Act, 21 U.S.C. section 360bbb-3(b)(1), unless the authorization is terminated or revoked sooner.    Influenza A by PCR NEGATIVE NEGATIVE Final   Influenza B by PCR NEGATIVE NEGATIVE Final    Comment: (NOTE) The Xpert Xpress SARS-CoV-2/FLU/RSV assay is intended as an aid in  the diagnosis of influenza from Nasopharyngeal swab specimens and  should not be used as a sole basis for treatment. Nasal washings and  aspirates are unacceptable for Xpert Xpress SARS-CoV-2/FLU/RSV  testing. Fact Sheet for Patients: PinkCheek.be Fact Sheet for Healthcare Providers: GravelBags.it This test is not yet approved or cleared by the [place] FDA and  has been authorized for detection and/or diagnosis of SARS-CoV-2 by  FDA under an Emergency Use Authorization (EUA). This EUA will remain  in effect (meaning this test can be used) for the duration of the  Covid-19 declaration under Section 564(b)(1) of the Act, 21  U.S.C. section 360bbb-3(b)(1), unless the  authorization is  terminated or revoked. Performed at [facility], [place] [address], [place], [place] [address]   MRSA PCR Screening     Status: None   Collection Time: 05/10/19  9:57 PM   Specimen: Nasal Mucosa; Nasopharyngeal  Result Value Ref Range Status   MRSA by PCR NEGATIVE NEGATIVE Final    Comment:        The GeneXpert MRSA Assay (FDA approved for NASAL specimens only), is one component of a comprehensive MRSA colonization surveillance program. It is not intended to diagnose MRSA infection nor to guide or monitor treatment for MRSA infections. Performed at [facility], [place] [address], [place], [place] [address]        Inpatient Medications:   Scheduled Meds: . sodium chloride   Intravenous Once  . acetaminophen  1,000 mg Oral Q6H  . ALPRAZolam  0.5 mg Oral QHS  . Chlorhexidine Gluconate Cloth  6 each Topical Daily  . cholecalciferol  2,000 Units Oral BID  . levothyroxine  100 mcg Oral Q0600  . polyvinyl alcohol  1 drop Both Eyes QID   Continuous Infusions: . sodium chloride 125 mL/hr at 05/13/19 0618  . methocarbamol (ROBAXIN) IV       Radiological Exams on Admission: No results found.  Impression/Recommendations Principal Problem:   Closed fracture of right femur (HCC) Active Problems:   TOBACCO ABUSE   Compression fracture of L2 lumbar vertebra (HCC)   Hypothyroidism   Unspecified atrial fibrillation (HCC)   Anxiety   Anemia, unspecified   Sacral lytic lesions  Closed fracture RIGHT femur -Repair per orthopedic surgery. -Most likely site of significant loss of blood.  Per patient and daughter no history of anemia, and colonoscopy 2018 was clear.  Negative history of gastric ulcers. -Although patient has some symptoms that could be consistent with multiple myeloma patient should be low risk for surgery, given negative cardiac history (previous past history of A. fib not treated), or respiratory history.  Compression fracture L2  lumbar vertebrae/Sacral lytic lesions -Patient has multiple sacral lytic lesions and new L2 lumbar vertebrae fracture, which may have been the cause/contributed to patient's initial fall.  Patient was seeing Dr. Ellene Route neurosurgery for work-up.  If patient is to be in hospital for several days post femur repair recommend consulting Dr. Ellene Route -Lesions could be consistent with multiple myeloma -UPEP pending -SPEP pending -Kappa/lambda free light chain pending -3/8 discussed case with Dr. Ellene Route neurosurgery who stated he would see patient.  Requested that patient obtain MRI of L/S spine -We will await his recommendations  Acute respiratory failure with hypoxia SATURATION QUALIFICATIONS: (This note is used to comply with regulatory documentation for home oxygen) Patient Saturations on Room Air at Rest = 90% Patient Saturations on Room Air while Ambulating = 81% Patient Saturations on 2.5 Liters of oxygen while Ambulating = 93% Please briefly explain why patient needs home oxygen: Patient requires supplemental oxygen to maintain oxygen saturations at acceptable, safe levels with physical activity. -Patient meets criteria for home O2 -2 L O2 via [place], titrate to maintain SPO2> 88% -Provide Inogen home O2 portable concentrator  Unspecified atrial fibrillation -Currently NSR -Not on home medication for A. fib. -Would monitor closely given stress of current situation though patient has not been in A. fib may cause her to lapse into atrial fibrillation. -3/4 fecal occult negative -3/7 transfuse 1 unit PRBC -Transfuse for hemoglobin<7  Anemia -See closed fracture -3/4 fecal occult blood negative   Hypothyroidism -Synthroid 100 mcg daily  Hypomagnesmia -Magnesium goal> 2  Hypokalemia -Potassium goal> 4 -K-Dur 40 mEq x 2  Anxiety -Xanax 0.5 mg qhs    Thank you for this consultation.  Our [facility] hospitalist team will follow the patient with you.   Time Spent: 40-minute  Aleena Kirkeby,  Geraldo Docker M.D. [facility] 05/14/2019, 11:35 AM  [phone]

## 2019-05-15 ENCOUNTER — Encounter (HOSPITAL_COMMUNITY): Payer: Self-pay | Admitting: Orthopedic Surgery

## 2019-05-15 ENCOUNTER — Inpatient Hospital Stay (HOSPITAL_COMMUNITY): Payer: Medicare Other

## 2019-05-15 DIAGNOSIS — D649 Anemia, unspecified: Secondary | ICD-10-CM

## 2019-05-15 DIAGNOSIS — M899 Disorder of bone, unspecified: Secondary | ICD-10-CM

## 2019-05-15 DIAGNOSIS — S72001A Fracture of unspecified part of neck of right femur, initial encounter for closed fracture: Secondary | ICD-10-CM

## 2019-05-15 LAB — COMPREHENSIVE METABOLIC PANEL
ALT: 9 U/L (ref 0–44)
AST: 19 U/L (ref 15–41)
Albumin: 2.1 g/dL — ABNORMAL LOW (ref 3.5–5.0)
Alkaline Phosphatase: 78 U/L (ref 38–126)
Anion gap: 9 (ref 5–15)
BUN: 12 mg/dL (ref 8–23)
CO2: 31 mmol/L (ref 22–32)
Calcium: 8.2 mg/dL — ABNORMAL LOW (ref 8.9–10.3)
Chloride: 98 mmol/L (ref 98–111)
Creatinine, Ser: 0.54 mg/dL (ref 0.44–1.00)
GFR calc Af Amer: >60 mL/min (ref >60)
GFR calc non Af Amer: >60 mL/min (ref >60)
Glucose, Bld: 100 mg/dL — ABNORMAL HIGH (ref 70–99)
Potassium: 3.9 mmol/L (ref 3.5–5.1)
Sodium: 138 mmol/L (ref 135–145)
Total Bilirubin: 0.9 mg/dL (ref 0.3–1.2)
Total Protein: 4.7 g/dL — ABNORMAL LOW (ref 6.5–8.1)

## 2019-05-15 LAB — MAGNESIUM: Magnesium: 1.8 mg/dL (ref 1.7–2.4)

## 2019-05-15 LAB — METHYLMALONIC ACID, SERUM: Methylmalonic Acid, Quantitative: 395 nmol/L — ABNORMAL HIGH (ref 0–378)

## 2019-05-15 LAB — UPEP/UIFE/LIGHT CHAINS/TP, 24-HR UR
% BETA, Urine: 30.1 %
ALPHA 1 URINE: 7.6 %
Albumin, U: 12.7 %
Alpha 2, Urine: 11.8 %
Free Kappa Lt Chains,Ur: 380.29 mg/L — ABNORMAL HIGH (ref 0.63–113.79)
Free Kappa/Lambda Ratio: 46.66 — ABNORMAL HIGH (ref 1.03–31.76)
Free Lambda Lt Chains,Ur: 8.15 mg/L (ref 0.47–11.77)
GAMMA GLOBULIN URINE: 37.8 %
Total Protein, Urine-Ur/day: 420 mg/24 hr — ABNORMAL HIGH (ref 30–150)
Total Protein, Urine: 30 mg/dL
Total Volume: 1400

## 2019-05-15 LAB — CBC
HCT: 28.3 % — ABNORMAL LOW (ref 36.0–46.0)
Hemoglobin: 9 g/dL — ABNORMAL LOW (ref 12.0–15.0)
MCH: 28.8 pg (ref 26.0–34.0)
MCHC: 31.8 g/dL (ref 30.0–36.0)
MCV: 90.4 fL (ref 80.0–100.0)
Platelets: 190 10*3/uL (ref 150–400)
RBC: 3.13 MIL/uL — ABNORMAL LOW (ref 3.87–5.11)
RDW: 15.1 % (ref 11.5–15.5)
WBC: 4.7 10*3/uL (ref 4.0–10.5)
nRBC: 0 % (ref 0.0–0.2)

## 2019-05-15 MED ORDER — CYANOCOBALAMIN 1000 MCG/ML IJ SOLN
1000.0000 ug | Freq: Once | INTRAMUSCULAR | Status: AC
  Administered 2019-05-15: 1000 ug via INTRAMUSCULAR
  Filled 2019-05-15: qty 1

## 2019-05-15 MED ORDER — LORAZEPAM 2 MG/ML IJ SOLN
2.0000 mg | Freq: Once | INTRAMUSCULAR | Status: AC
  Administered 2019-05-15: 2 mg via INTRAVENOUS

## 2019-05-15 MED ORDER — LORAZEPAM 2 MG/ML IJ SOLN
INTRAMUSCULAR | Status: AC
  Filled 2019-05-15: qty 1

## 2019-05-15 MED ORDER — IOHEXOL 300 MG/ML  SOLN
100.0000 mL | Freq: Once | INTRAMUSCULAR | Status: AC | PRN
  Administered 2019-05-15: 100 mL via INTRAVENOUS

## 2019-05-15 NOTE — TOC Progression Note (Signed)
Transition of Care [facility]) - Progression Note    Patient Details  Name: Courtney Arnold MRN: [id number] Date of Birth: [birth year]  [facility] CM/SW Contact  Sharin Mons, RN Phone Number: [phone] 05/15/2019, 4:07 PM  Clinical Narrative:    NCM received call from [facility] SN[facility] admissions  extending bed offer, pt's preference. NCM made pt/daughter aware, acceptance received. Voice message left with [facility] admission regarding acceptance. TOC team will continue to monitor....   Expected Discharge Plan: Skilled Nursing Facility Barriers to Discharge: Continued Medical Work up  Expected Discharge Plan and Services Expected Discharge Plan: [place]                                               Social Determinants of Health (SDOH) Interventions    Readmission Risk Interventions No flowsheet data found.

## 2019-05-15 NOTE — Consult Note (Addendum)
[place]  [phone] [phone]   MEDICAL ONCOLOGY - INITIAL CONSULTATION  Referral MD: Dr. Lennette Bihari Haddix  Reason for Referral: Lytic bone lesions  HPI: Courtney Arnold is a 79 year old female with a past medical history significant for atrial fibrillation, compression fracture T12 status post kyphoplasty, hypertension, anemia, hypothyroidism, anxiety.  The patient was admitted to the hospital following a fall and fracture of her right femur.  She underwent a retrograde intramedullary nailing of the right femoral shaft fracture on 05/11/2019.  The patient has known lytic bone lesions and was seen by medical oncology in the [facility] earlier this month.  Work-up was in progress.  The patient was scheduled to have a PET scan tomorrow to determine the primary site and then to consider the best location to biopsy.  Labs performed at her initial medical oncology visit include an SPEP which did not show any M spike, kappa free light chain at 42.8, lambda free light chain at 9.9, and kappa, lambda light chain ratio of 4.32, ferritin 64, percent saturation 9%, vitamin B12 level of 118.  The patient has been anemic this admission following her surgery.  She has received 3 units PRBCs this admission.  When seen today, the patient reports ongoing right hip pain.  She is also having constipation secondary to her pain medications.  Prior to admission, she was having a 3-week history of low back pain.  She reports that her appetite has been fair and she is unsure if she has lost any weight recently.  She denies recent fevers or chills.  Denies headaches or vision changes.  She denies chest pain, shortness of breath, cough.  Denies abdominal pain, nausea, vomiting.  She has not noticed any epistaxis, hemoptysis, hematemesis, hematuria, melena, hematochezia.  The patient is widowed and has 2 children.  Denies history of alcohol use.  Reports that she smoked 1/4 pack of cigarettes per day  x20 years but quit in 2015.  Medical oncology was asked see the patient to make recommendations regarding her lytic bone lesions.   Past Medical History:  Diagnosis Date  . Atrial fibrillation ([place]) 2011   Postop, spontaneous conversion to normal sinus after one hour  . Compression fracture 07/24/09   T12; kyphoplasty  . History of echocardiogram 5/11   EF 65%  . Hypertension   . Thyroid disease   . Tobacco abuse   :  Past Surgical History:  Procedure Laterality Date  . BACK SURGERY    . BACK SURGERY  06/06/2015  . BREAST EXCISIONAL BIOPSY Left    50 years ago  benign  . CHOLECYSTECTOMY N/A 04/25/2015   Procedure: LAPAROSCOPIC CHOLECYSTECTOMY WITH INTRAOPERATIVE CHOLANGIOGRAM;  Surgeon: Mickeal Skinner, MD;  Location: [facility];  Service: General;  Laterality: N/A;  . COLONOSCOPY N/A 11/26/2015   Procedure: COLONOSCOPY;  Surgeon: Daneil Dolin, MD;  Location: [facility];  Service: Endoscopy;  Laterality: N/A;  7:30 am  . ERCP N/A 04/14/2015   Procedure: ENDOSCOPIC RETROGRADE CHOLANGIOPANCREATOGRAPHY (ERCP) Biliary Sphincterotomy, 10x7 stent placement Dilated bilary system just not well seen;  Surgeon: Rogene Houston, MD;  Location: [facility];  Service: Endoscopy;  Laterality: N/A;  . ERCP N/A 06/12/2015   Procedure: ENDOSCOPIC RETROGRADE CHOLANGIOPANCREATOGRAPHY (ERCP);  Surgeon: Rogene Houston, MD;  Location: [facility];  Service: Endoscopy;  Laterality: N/A;  . ESOPHAGOGASTRODUODENOSCOPY N/A 06/12/2015   Procedure: DIAGNOSTIC ESOPHAGOGASTRODUODENOSCOPY (EGD);  Surgeon: Rogene Houston, MD;  Location: [facility];  Service: Endoscopy;  Laterality: N/A;  . FEMUR IM NAIL Right 05/11/2019   Procedure: RETROGRADE INTRAMEDULLARY NAIL FEMORAL;  Surgeon: Shona Needles, MD;  Location: [place];  Service: Orthopedics;  Laterality: Right;  . STENT REMOVAL  06/12/2015   Procedure: STENT REMOVAL ;  Surgeon: Rogene Houston, MD;  Location: [facility];  Service: Endoscopy;;  :  Current  Facility-Administered Medications  Medication Dose Route Frequency Provider Last Rate Last Admin  . 0.9 %  sodium chloride infusion (Manually program via Guardrails IV Fluids)   Intravenous Once Allie Bossier, MD   Stopped at 05/13/19 1804  . 0.9 %  sodium chloride infusion   Intravenous Continuous Delray Alt, PA-C 125 mL/hr at 05/15/19 0931 New Bag at 05/15/19 0931  . acetaminophen (TYLENOL) tablet 1,000 mg  1,000 mg Oral Q6H Patrecia Pace A, PA-C   1,000 mg at 05/15/19 1307  . ALPRAZolam Duanne Moron) tablet 0.5 mg  0.5 mg Oral QHS Patrecia Pace A, PA-C   0.5 mg at 05/14/19 2202  . Chlorhexidine Gluconate Cloth 2 % PADS 6 each  6 each Topical Daily Delray Alt, PA-C   6 each at 05/15/19 0932  . cholecalciferol (VITAMIN D3) tablet 2,000 Units  2,000 Units Oral BID Delray Alt, PA-C   2,000 Units at 05/15/19 0932  . cyanocobalamin ((VITAMIN B-12)) injection 1,000 mcg  1,000 mcg Intramuscular Once Mikey Bussing R, NP      . docusate sodium (COLACE) capsule 200 mg  200 mg Oral BID PRN Lovey Newcomer T, NP   200 mg at 05/13/19 2220  . enoxaparin (LOVENOX) injection 40 mg  40 mg Subcutaneous Q24H Patrecia Pace A, PA-C   40 mg at 05/15/19 0932  . levothyroxine (SYNTHROID) tablet 100 mcg  100 mcg Oral Q0600 Delray Alt, PA-C   100 mcg at 05/15/19 9833  . methocarbamol (ROBAXIN) tablet 500 mg  500 mg Oral Q6H PRN Delray Alt, PA-C   500 mg at 05/11/19 2106   Or  . methocarbamol (ROBAXIN) 500 mg in dextrose 5 % 50 mL IVPB  500 mg Intravenous Q6H PRN Patrecia Pace A, PA-C      . morphine 2 MG/ML injection 1-2 mg  1-2 mg Intravenous Q2H PRN Patrecia Pace A, PA-C   2 mg at 05/11/19 1709  . ondansetron (ZOFRAN) tablet 4 mg  4 mg Oral Q6H PRN Patrecia Pace A, PA-C       Or  . ondansetron [facility]) injection 4 mg  4 mg Intravenous Q6H PRN Patrecia Pace A, PA-C   4 mg at 05/13/19 2259  . oxyCODONE (Oxy IR/ROXICODONE) immediate release tablet 5-10 mg  5-10 mg Oral Q4H PRN Delray Alt, PA-C    5 mg at 05/14/19 8250  . polyvinyl alcohol (LIQUIFILM TEARS) 1.4 % ophthalmic solution 1 drop  1 drop Both Eyes QID Ethelda Chick, PA-C   1 drop at 05/15/19 1307     No Known Allergies:  Family History  Problem Relation Age of Onset  . Heart attack Father   . Stroke Father   . Breast cancer Sister   . Thyroid disease Neg Hx   . Colon cancer Neg Hx   :  Social History   Socioeconomic History  . Marital status: Widowed    Spouse name: Not on file  . Number of children: 2  . Years of education: Not on file  . Highest education level: Not on file  Occupational History  . Not on file  Tobacco Use  . Smoking status: Former Smoker    Packs/day: 0.15    Years: 20.00    Pack years: 3.00    Quit date: 03/08/2013    Years since quitting: 6.1  . Smokeless tobacco: Never Used  Substance and Sexual Activity  . Alcohol use: No    Alcohol/week: 0.0 standard drinks  . Drug use: No  . Sexual activity: Not Currently  Other Topics Concern  . Not on file  Social History Narrative   Active in gardens and does yard work.   Social Determinants of Health   Financial Resource Strain: Low Risk   . Difficulty of Paying Living Expenses: Not hard at all  Food Insecurity: No Food Insecurity  . Worried About Charity fundraiser in the Last Year: Never true  . Ran Out of Food in the Last Year: Never true  Transportation Needs: No Transportation Needs  . Lack of Transportation (Medical): No  . Lack of Transportation (Non-Medical): No  Physical Activity: Inactive  . Days of Exercise per Week: 0 days  . Minutes of Exercise per Session: 0 min  Stress: No Stress Concern Present  . Feeling of Stress : Not at all  Social Connections:   . Frequency of Communication with Friends and Family: Not on file  . Frequency of Social Gatherings with Friends and Family: Not on file  . Attends Religious Services: Not on file  . Active Member of Clubs or Organizations: Not on file  . Attends Theatre manager Meetings: Not on file  . Marital Status: Not on file  Intimate Partner Violence: Not At Risk  . Fear of Current or Ex-Partner: No  . Emotionally Abused: No  . Physically Abused: No  . Sexually Abused: No  :  Review of Systems: A comprehensive 14 point review of systems was negative except as noted in the HPI.  Exam: Patient Vitals for the past 24 hrs:  BP Temp Temp src Pulse Resp SpO2  05/15/19 1230 124/67 98 F (36.7 C) Oral 97 16 100 %  05/15/19 1215 -- -- -- -- -- 95 %  05/15/19 0820 132/60 98.2 F (36.8 C) Oral 97 18 100 %  05/15/19 0457 -- -- -- -- -- 95 %  05/15/19 0339 (!) 126/50 97.9 F (36.6 C) Oral 98 15 (!) 89 %  05/14/19 2020 129/68 98 F (36.7 C) Oral (!) 102 14 96 %    General: Elderly female, no distress Eyes:  no scleral icterus.   ENT:  There were no oropharyngeal lesions.   Neck was without thyromegaly.   Lymphatics:  Negative cervical, supraclavicular or axillary adenopathy.   Respiratory: lungs were clear bilaterally without wheezing or crackles.   Cardiovascular:  Regular rate and rhythm, S1/S2, without murmur, rub or gallop. Trace right lower extremity edema. GI:  abdomen was soft, flat, nontender, nondistended, without organomegaly.   Musculoskeletal:  no spinal tenderness of palpation of vertebral spine.   Skin: Well-healing incisions to the right leg, Steri-Strips intact Neuro exam was nonfocal. Patient was alert and oriented.  Attention was good.   Language was appropriate.  Mood was normal without depression.  Speech was not pressured.  Thought content was not tangential.     Lab Results  Component Value Date   WBC 4.7 05/15/2019   HGB 9.0 (L) 05/15/2019   HCT 28.3 (L) 05/15/2019   PLT 190 05/15/2019   GLUCOSE 100 (H) 05/15/2019   ALT 9 05/15/2019   AST 19 05/15/2019  NA 138 05/15/2019   K 3.9 05/15/2019   CL 98 05/15/2019   CREATININE 0.54 05/15/2019   BUN 12 05/15/2019   CO2 31 05/15/2019    DG Chest 1 View  Result  Date: 05/10/2019 CLINICAL DATA:  Trauma secondary to a fall in the shower this morning. EXAM: CHEST  1 VIEW COMPARISON:  06/11/2015 FINDINGS: Heart size and pulmonary vascularity are normal. Lungs are clear. No effusions. Multiple old thoracic compression fractures some of which have been treated with vertebroplasty. There is a new compression fracture of T10 since 06/11/2015. IMPRESSION: New compression fracture of T10 since 06/11/2015. No acute cardiopulmonary abnormalities. Electronically Signed   By: Lorriane Shire M.D.   On: 05/10/2019 14:50   DG Knee 1-2 Views Right  Result Date: 05/10/2019 CLINICAL DATA:  Fall EXAM: RIGHT KNEE - 1-2 VIEW COMPARISON:  Concurrent femur radiograph. FINDINGS: Displaced distal femoral diaphysis fracture better assessed on dedicated femur radiographs performed concurrently. Mild tricompartmental degenerative changes of the knee. No other acute fracture or traumatic malalignment is seen. Small effusion is present. IMPRESSION: Displaced distal femoral fracture better assessed on dedicated femur radiographs performed concurrently. Trace knee effusion.  No additional fractures. Electronically Signed   By: Lovena Le M.D.   On: 05/10/2019 15:43   MR Lumbar Spine W Wo Contrast  Result Date: 05/14/2019 CLINICAL DATA:  Episodes of lower extremity weakness EXAM: MRI LUMBAR SPINE WITHOUT AND WITH CONTRAST TECHNIQUE: Multiplanar and multiecho pulse sequences of the lumbar spine were obtained without and with intravenous contrast. CONTRAST:  12m GADAVIST GADOBUTROL 1 MMOL/ML IV SOLN COMPARISON:  MRI 2011, CT 2017 FINDINGS: Segmentation:  Standard. Alignment:  Anteroposterior alignment is maintained. Vertebrae: There is new loss of height at the superior endplate of L4 of nearly 50%. There is mild endplate retropulsion. However, there is no marrow edema to suggest acuity. Chronic compression deformity of T12 with cement augmentation. Marrow signal is mildly heterogeneous likely related  to decreased mineralization. Partially imaged STIR hyperintense lesions of the sacrum better evaluated on concurrent dedicated imaging. Conus medullaris and cauda equina: Conus extends to the L2 level. Conus and cauda equina appear normal. No abnormal intrathecal enhancement. Paraspinal and other soft tissues: Left renal cyst. Posterior paraspinal muscle atrophy. Disc levels: L1-L2:  No canal or foraminal stenosis. L2-L3:  No canal or foraminal stenosis. L3-L4: Disc bulge with endplate osteophytic ridging and facet arthropathy with ligamentum flavum infolding. Moderate canal stenosis with partial effacement of the lateral recesses. Mild right and mild to moderate left foraminal stenosis. L4-L5: Disc bulge with superimposed small central protrusion and facet arthropathy with ligamentum flavum infolding. Mild canal stenosis. Mild foraminal stenosis. L5-S1: Disc bulge with endplate osteophytic ridging facet arthropathy. No canal stenosis. Mild left greater than right foraminal stenosis. IMPRESSION: New but chronic appearing compression deformity of L4. Multilevel degenerative changes with some progression since remote study of 2011. Stenosis is greatest at L3-L4. Electronically Signed   By: PMacy MisM.D.   On: 05/14/2019 20:12   MR SACRUM SI JOINTS W WO CONTRAST  Result Date: 05/15/2019 CLINICAL DATA:  Sacral lesions on prior CT examination, for further investigation. EXAM: MRI PELVIS WITHOUT AND WITH CONTRAST TECHNIQUE: Multiplanar multisequence MR imaging of the pelvis was performed both before and after administration of intravenous contrast. CONTRAST:  55mGADAVIST GADOBUTROL 1 MMOL/ML IV SOLN COMPARISON:  CT pelvis 05/01/2019 FINDINGS: Osseous structures: Expansile likely enhancing 4.3 by 2.8 by 3.3 cm lesion of the right upper sacral ala noted with bulging anterior contour and  high precontrast T2 signal. This mildly abuts and displaces the right sacral plexus. Separate but similar left sacral lesion on  image 12/15 measures 2.3 by 1.9 by 1.9 cm. A third similar but smaller lesion is present in the right posterior acetabular wall measuring 1.3 by 1.0 by 1.0 cm on image 23/13. Please note that today's exam was focused on the bony sacrum rather than the entire bony pelvis. Musculoskeletal: Normal appearance of the piriformis musculature and visualized iliopsoas musculature. Joints: The larger right sacral lesion abuts the sacral side of the SI joint. No SI joint effusion is identified. Limited assessment of the hip joints demonstrates upper normal amount of fluid. Soft tissues: Lower presacral high T2 and low T1 signal favoring presacral edema, nonspecific. This appears increased compared to the CT examination from 05/01/2019. Sigmoid colon diverticulosis. IMPRESSION: 1. Expansile lesions of the right upper sacral ala, left upper sacrum, and right posterior acetabular wall. The larger right sacral lesion abuts and displaces the right sacral plexus. Top differential diagnostic considerations include myeloma and metastatic disease. 2. No other pelvic masses are identified. 3. Nonspecific presacral edema, increased compared to 05/01/2019. 4. Sigmoid colon diverticulosis. Electronically Signed   By: Van Clines M.D.   On: 05/15/2019 08:10   CT FEMUR RIGHT WO CONTRAST  Result Date: 05/10/2019 CLINICAL DATA:  79 year old female with trauma to the right lower extremity. EXAM: CT OF THE LOWER RIGHT EXTREMITY WITHOUT CONTRAST TECHNIQUE: Multidetector CT imaging of the right lower extremity was performed according to the standard protocol. COMPARISON:  Right lower extremity radiograph dated 05/10/2019. FINDINGS: Bones/Joint/Cartilage There is a displaced spiral fracture of the distal third of the right femoral diaphysis. There is external rotation and valgus angulation of the distal fracture fragment. The bones are osteopenic. There is no dislocation. There is a small suprapatellar effusion. Partially visualized  lucent area in the lateral aspect of the right sacral bone adjacent to the sacroiliac joint (series 3, image 1) noted which is not characterized and appears new since the study of 06/11/2015. This may be represent degenerative changes and osteopenia although a lytic lesion is not excluded. Ligaments Suboptimally assessed by CT. Muscles and Tendons No intramuscular hematoma or large fluid collection. Soft tissues Sigmoid diverticulosis. There is stranding and edema in the deep fascial of the right thigh as well as edema of the subcutaneous soft tissues of the distal thigh and knee. IMPRESSION: 1. Displaced spiral fracture of the distal third of the right femoral diaphysis with external rotation and valgus angulation of the distal fracture fragment. No dislocation. 2. Small suprapatellar effusion. 3. Partially visualized lucent area in the lateral aspect of the right sacral bone adjacent to the SI joint. This may represent degenerative changes and osteopenia although a lytic lesion is not excluded. MRI may provide better evaluation on a nonemergent/outpatient basis. Electronically Signed   By: Anner Crete M.D.   On: 05/10/2019 21:09   DG C-Arm 1-60 Min  Result Date: 05/11/2019 CLINICAL DATA:  Right femur fracture fixation. EXAM: RIGHT FEMUR 2 VIEWS; DG C-ARM 1-60 MIN COMPARISON:  Radiographs and CT right femur 05/10/2019 and pelvis 05/01/2019. FINDINGS: C-arm fluoroscopy was provided in the operating room. 2 minutes and 23 seconds of fluoroscopy time. 7 spot fluoroscopic images are submitted. These demonstrate the placement of a right femoral intramedullary nail which is secured by 2 proximal and 4 distal interlocking screws. Final images demonstrate near anatomic reduction of the main mid diaphyseal fracture fragments. No demonstrated complications. IMPRESSION: Intraoperative views during right femoral  fracture ORIF. This fracture is likely pathologic based on recent CT findings. Electronically Signed   By:  Richardean Sale M.D.   On: 05/11/2019 12:00   CT Renal Stone Study  Addendum Date: 05/01/2019   ADDENDUM REPORT: 05/01/2019 22:05 ADDENDUM: Following addendum to the findings above, originally discussed with ordering provider Dr. Roxanne Mins on 02/58/5277 at 5:16 a.m. Musculoskeletal: Expansile lytic lesions are present within the sacrum including a 4.2 cm lesion in the right sacral ala which extends into the right SI joint and a smaller infiltrating lytic focus in the left sacral ala measuring up to 2.4 cm (5/63) and a separate focus more posteriorly measuring 1.9 cm (5/69, heterogeneous marrow elsewhere in the sacrum is conspicuous as well and worrisome for additional site of osseous involvement. Electronically Signed   By: Lovena Le M.D.   On: 05/01/2019 22:05   Result Date: 05/01/2019 CLINICAL DATA:  Lower back pain for several days EXAM: CT ABDOMEN AND PELVIS WITHOUT CONTRAST TECHNIQUE: Multidetector CT imaging of the abdomen and pelvis was performed following the standard protocol without IV contrast. COMPARISON:  Lower back pain, unable to sleep FINDINGS: Lower chest: Some bandlike areas of atelectasis or scarring present in the lower chest. No consolidation or effusion. Normal heart size. No pericardial effusion. Hepatobiliary: Punctate calcification present in the anterior left lobe liver, stable from prior. No focal concerning liver lesion. Smooth hepatic surface contour. Extensive intra and extrahepatic biliary ductal dilatation, greater than expected for age or reservoir effect. No visible intraductal gallstones. No residual pneumatosis. Pancreas: Unremarkable. No pancreatic ductal dilatation or surrounding inflammatory changes. Spleen: Normal in size without focal abnormality. Adrenals/Urinary Tract: Nodular thickening of the adrenal glands without discrete adrenal lesion, similar to prior. Enlarging simple attenuation cyst in the anterior interpolar left kidney measuring 4.2 cm in size and 5 HU  attenuation. No worrisome features. Additional 1.1 cm subcapsular cyst in the right kidney measuring fluid attenuation. No visible or contour deforming worrisome renal lesions. No urolithiasis or hydronephrosis. Urinary bladder is unremarkable. Stomach/Bowel: Fluid-filled hiatal hernia. Distal stomach is unremarkable. Duodenal sweep takes a normal course across the abdomen. No small bowel dilatation or wall thickening. Mild distal small bowel fecalization. A normal appendix is visualized. Moderate stool burden in the colon. No colonic dilatation or wall thickening. Scattered colonic diverticula without focal pericolonic inflammation to suggest diverticulitis. Vascular/Lymphatic: Atherosclerotic plaque within the normal caliber aorta. No suspicious or enlarged lymph nodes in the included lymphatic chains. Reproductive: Normal appearance of the uterus and adnexal structures. Other: No free fluid.  No free air.  No bowel containing hernias. Musculoskeletal: Unchanged appearance of an L2 superior endplate compression deformity with approximately 40% height loss superiorly. Prior T12 compression deformity with vertebroplasty changes is also stable. No new fracture or compression deformity is seen. No left though hilar IMPRESSION: Expansile lytic lesions within the sacrum with some endosteal scalloping. Appearance is concerning for possible myeloma versus metastatic disease. Correlation with patient history and serologies is recommended. No obstructive urolithiasis or hydronephrosis. Fluid-filled hiatal hernia, correlate for symptoms of reflux. Small bowel fecalization, correlate for slowed intestinal transit. Stable appearance of an L2 superior endplate compression deformity prior T12 compression deformity with post vertebroplasty changes. Biliary ductal dilatation, greater than expected for age and reservoir effect. Consider liver serologies and if elevated MRCP could be obtained. These results were called by telephone  at the time of interpretation on 05/01/2019 at 5:16 am to provider DAVID [facility] , who verbally acknowledged these results. Electronically Signed: By: March Rummage  [facility] M.D. On: 05/01/2019 05:17   DG Hip Unilat With Pelvis 2-3 Views Right  Result Date: 05/10/2019 CLINICAL DATA:  Right hip pain since a fall in the shower this morning. EXAM: DG HIP (WITH OR WITHOUT PELVIS) 2-3V RIGHT COMPARISON:  Radiographs dated 03/26/2019 FINDINGS: There is no evidence of hip fracture or dislocation. There is no evidence of arthropathy or other focal bone abnormality. IMPRESSION: Negative. Electronically Signed   By: Lorriane Shire M.D.   On: 05/10/2019 14:51   DG FEMUR, MIN 2 VIEWS RIGHT  Result Date: 05/11/2019 CLINICAL DATA:  Right femur fracture fixation. EXAM: RIGHT FEMUR 2 VIEWS; DG C-ARM 1-60 MIN COMPARISON:  Radiographs and CT right femur 05/10/2019 and pelvis 05/01/2019. FINDINGS: C-arm fluoroscopy was provided in the operating room. 2 minutes and 23 seconds of fluoroscopy time. 7 spot fluoroscopic images are submitted. These demonstrate the placement of a right femoral intramedullary nail which is secured by 2 proximal and 4 distal interlocking screws. Final images demonstrate near anatomic reduction of the main mid diaphyseal fracture fragments. No demonstrated complications. IMPRESSION: Intraoperative views during right femoral fracture ORIF. This fracture is likely pathologic based on recent CT findings. Electronically Signed   By: Richardean Sale M.D.   On: 05/11/2019 12:00   DG Femur Min 2 Views Right  Result Date: 05/10/2019 CLINICAL DATA:  Fall EXAM: RIGHT FEMUR 2 VIEWS COMPARISON:  Distal femur and right knee pain FINDINGS: Displaced spiral fracture of the distal femoral diaphysis with valgus angulation and external rotation of the lower fracture fragment. No proximal femoral fracture is seen. Femoral head remains normally located. Moderate degenerative changes at the hip. No additional fractures seen distal to  the diaphyseal fracture site. Dedicated knee radiographs were performed concurrently. Extensive overlying soft tissue swelling without soft tissue gas to suggest radiographic evidence of compound fracture. IMPRESSION: Displaced spiral fracture of the distal femoral diaphysis with valgus angulation and external rotation of the lower fracture fragment. Electronically Signed   By: Lovena Le M.D.   On: 05/10/2019 15:42   DG FEMUR PORT, MIN 2 VIEWS RIGHT  Result Date: 05/11/2019 CLINICAL DATA:  Postop right femoral nail placement EXAM: RIGHT FEMUR PORTABLE 2 VIEW COMPARISON:  None. FINDINGS: Mildly comminuted oblique fracture of the distal femoral diaphysis transfixed with a intramedullary nail and interlocking screws. 6 mm of anterior displacement. No other fracture or dislocation. Generalized osteopenia. Postsurgical changes in the surrounding soft tissues. IMPRESSION: 1. Mildly comminuted oblique fracture of the distal femoral diaphysis transfixed with an intramedullary nail and interlocking screws. 6 mm of anterior displacement. Electronically Signed   By: Kathreen Devoid   On: 05/11/2019 12:40     DG Chest 1 View  Result Date: 05/10/2019 CLINICAL DATA:  Trauma secondary to a fall in the shower this morning. EXAM: CHEST  1 VIEW COMPARISON:  06/11/2015 FINDINGS: Heart size and pulmonary vascularity are normal. Lungs are clear. No effusions. Multiple old thoracic compression fractures some of which have been treated with vertebroplasty. There is a new compression fracture of T10 since 06/11/2015. IMPRESSION: New compression fracture of T10 since 06/11/2015. No acute cardiopulmonary abnormalities. Electronically Signed   By: Lorriane Shire M.D.   On: 05/10/2019 14:50   DG Knee 1-2 Views Right  Result Date: 05/10/2019 CLINICAL DATA:  Fall EXAM: RIGHT KNEE - 1-2 VIEW COMPARISON:  Concurrent femur radiograph. FINDINGS: Displaced distal femoral diaphysis fracture better assessed on dedicated femur radiographs  performed concurrently. Mild tricompartmental degenerative changes of the knee. No other acute fracture or  traumatic malalignment is seen. Small effusion is present. IMPRESSION: Displaced distal femoral fracture better assessed on dedicated femur radiographs performed concurrently. Trace knee effusion.  No additional fractures. Electronically Signed   By: Lovena Le M.D.   On: 05/10/2019 15:43   MR Lumbar Spine W Wo Contrast  Result Date: 05/14/2019 CLINICAL DATA:  Episodes of lower extremity weakness EXAM: MRI LUMBAR SPINE WITHOUT AND WITH CONTRAST TECHNIQUE: Multiplanar and multiecho pulse sequences of the lumbar spine were obtained without and with intravenous contrast. CONTRAST:  51m GADAVIST GADOBUTROL 1 MMOL/ML IV SOLN COMPARISON:  MRI 2011, CT 2017 FINDINGS: Segmentation:  Standard. Alignment:  Anteroposterior alignment is maintained. Vertebrae: There is new loss of height at the superior endplate of L4 of nearly 50%. There is mild endplate retropulsion. However, there is no marrow edema to suggest acuity. Chronic compression deformity of T12 with cement augmentation. Marrow signal is mildly heterogeneous likely related to decreased mineralization. Partially imaged STIR hyperintense lesions of the sacrum better evaluated on concurrent dedicated imaging. Conus medullaris and cauda equina: Conus extends to the L2 level. Conus and cauda equina appear normal. No abnormal intrathecal enhancement. Paraspinal and other soft tissues: Left renal cyst. Posterior paraspinal muscle atrophy. Disc levels: L1-L2:  No canal or foraminal stenosis. L2-L3:  No canal or foraminal stenosis. L3-L4: Disc bulge with endplate osteophytic ridging and facet arthropathy with ligamentum flavum infolding. Moderate canal stenosis with partial effacement of the lateral recesses. Mild right and mild to moderate left foraminal stenosis. L4-L5: Disc bulge with superimposed small central protrusion and facet arthropathy with ligamentum  flavum infolding. Mild canal stenosis. Mild foraminal stenosis. L5-S1: Disc bulge with endplate osteophytic ridging facet arthropathy. No canal stenosis. Mild left greater than right foraminal stenosis. IMPRESSION: New but chronic appearing compression deformity of L4. Multilevel degenerative changes with some progression since remote study of 2011. Stenosis is greatest at L3-L4. Electronically Signed   By: PMacy MisM.D.   On: 05/14/2019 20:12   MR SACRUM SI JOINTS W WO CONTRAST  Result Date: 05/15/2019 CLINICAL DATA:  Sacral lesions on prior CT examination, for further investigation. EXAM: MRI PELVIS WITHOUT AND WITH CONTRAST TECHNIQUE: Multiplanar multisequence MR imaging of the pelvis was performed both before and after administration of intravenous contrast. CONTRAST:  529mGADAVIST GADOBUTROL 1 MMOL/ML IV SOLN COMPARISON:  CT pelvis 05/01/2019 FINDINGS: Osseous structures: Expansile likely enhancing 4.3 by 2.8 by 3.3 cm lesion of the right upper sacral ala noted with bulging anterior contour and high precontrast T2 signal. This mildly abuts and displaces the right sacral plexus. Separate but similar left sacral lesion on image 12/15 measures 2.3 by 1.9 by 1.9 cm. A third similar but smaller lesion is present in the right posterior acetabular wall measuring 1.3 by 1.0 by 1.0 cm on image 23/13. Please note that today's exam was focused on the bony sacrum rather than the entire bony pelvis. Musculoskeletal: Normal appearance of the piriformis musculature and visualized iliopsoas musculature. Joints: The larger right sacral lesion abuts the sacral side of the SI joint. No SI joint effusion is identified. Limited assessment of the hip joints demonstrates upper normal amount of fluid. Soft tissues: Lower presacral high T2 and low T1 signal favoring presacral edema, nonspecific. This appears increased compared to the CT examination from 05/01/2019. Sigmoid colon diverticulosis. IMPRESSION: 1. Expansile lesions  of the right upper sacral ala, left upper sacrum, and right posterior acetabular wall. The larger right sacral lesion abuts and displaces the right sacral plexus. Top differential diagnostic considerations  include myeloma and metastatic disease. 2. No other pelvic masses are identified. 3. Nonspecific presacral edema, increased compared to 05/01/2019. 4. Sigmoid colon diverticulosis. Electronically Signed   By: Van Clines M.D.   On: 05/15/2019 08:10   CT FEMUR RIGHT WO CONTRAST  Result Date: 05/10/2019 CLINICAL DATA:  79 year old female with trauma to the right lower extremity. EXAM: CT OF THE LOWER RIGHT EXTREMITY WITHOUT CONTRAST TECHNIQUE: Multidetector CT imaging of the right lower extremity was performed according to the standard protocol. COMPARISON:  Right lower extremity radiograph dated 05/10/2019. FINDINGS: Bones/Joint/Cartilage There is a displaced spiral fracture of the distal third of the right femoral diaphysis. There is external rotation and valgus angulation of the distal fracture fragment. The bones are osteopenic. There is no dislocation. There is a small suprapatellar effusion. Partially visualized lucent area in the lateral aspect of the right sacral bone adjacent to the sacroiliac joint (series 3, image 1) noted which is not characterized and appears new since the study of 06/11/2015. This may be represent degenerative changes and osteopenia although a lytic lesion is not excluded. Ligaments Suboptimally assessed by CT. Muscles and Tendons No intramuscular hematoma or large fluid collection. Soft tissues Sigmoid diverticulosis. There is stranding and edema in the deep fascial of the right thigh as well as edema of the subcutaneous soft tissues of the distal thigh and knee. IMPRESSION: 1. Displaced spiral fracture of the distal third of the right femoral diaphysis with external rotation and valgus angulation of the distal fracture fragment. No dislocation. 2. Small suprapatellar  effusion. 3. Partially visualized lucent area in the lateral aspect of the right sacral bone adjacent to the SI joint. This may represent degenerative changes and osteopenia although a lytic lesion is not excluded. MRI may provide better evaluation on a nonemergent/outpatient basis. Electronically Signed   By: Anner Crete M.D.   On: 05/10/2019 21:09   DG C-Arm 1-60 Min  Result Date: 05/11/2019 CLINICAL DATA:  Right femur fracture fixation. EXAM: RIGHT FEMUR 2 VIEWS; DG C-ARM 1-60 MIN COMPARISON:  Radiographs and CT right femur 05/10/2019 and pelvis 05/01/2019. FINDINGS: C-arm fluoroscopy was provided in the operating room. 2 minutes and 23 seconds of fluoroscopy time. 7 spot fluoroscopic images are submitted. These demonstrate the placement of a right femoral intramedullary nail which is secured by 2 proximal and 4 distal interlocking screws. Final images demonstrate near anatomic reduction of the main mid diaphyseal fracture fragments. No demonstrated complications. IMPRESSION: Intraoperative views during right femoral fracture ORIF. This fracture is likely pathologic based on recent CT findings. Electronically Signed   By: Richardean Sale M.D.   On: 05/11/2019 12:00   CT Renal Stone Study  Addendum Date: 05/01/2019   ADDENDUM REPORT: 05/01/2019 22:05 ADDENDUM: Following addendum to the findings above, originally discussed with ordering provider Dr. Roxanne Mins on [id number] at 5:16 a.m. Musculoskeletal: Expansile lytic lesions are present within the sacrum including a 4.2 cm lesion in the right sacral ala which extends into the right SI joint and a smaller infiltrating lytic focus in the left sacral ala measuring up to 2.4 cm (5/63) and a separate focus more posteriorly measuring 1.9 cm (5/69, heterogeneous marrow elsewhere in the sacrum is conspicuous as well and worrisome for additional site of osseous involvement. Electronically Signed   By: Lovena Le M.D.   On: 05/01/2019 22:05   Result Date:  05/01/2019 CLINICAL DATA:  Lower back pain for several days EXAM: CT ABDOMEN AND PELVIS WITHOUT CONTRAST TECHNIQUE: Multidetector CT imaging of the abdomen  and pelvis was performed following the standard protocol without IV contrast. COMPARISON:  Lower back pain, unable to sleep FINDINGS: Lower chest: Some bandlike areas of atelectasis or scarring present in the lower chest. No consolidation or effusion. Normal heart size. No pericardial effusion. Hepatobiliary: Punctate calcification present in the anterior left lobe liver, stable from prior. No focal concerning liver lesion. Smooth hepatic surface contour. Extensive intra and extrahepatic biliary ductal dilatation, greater than expected for age or reservoir effect. No visible intraductal gallstones. No residual pneumatosis. Pancreas: Unremarkable. No pancreatic ductal dilatation or surrounding inflammatory changes. Spleen: Normal in size without focal abnormality. Adrenals/Urinary Tract: Nodular thickening of the adrenal glands without discrete adrenal lesion, similar to prior. Enlarging simple attenuation cyst in the anterior interpolar left kidney measuring 4.2 cm in size and 5 HU attenuation. No worrisome features. Additional 1.1 cm subcapsular cyst in the right kidney measuring fluid attenuation. No visible or contour deforming worrisome renal lesions. No urolithiasis or hydronephrosis. Urinary bladder is unremarkable. Stomach/Bowel: Fluid-filled hiatal hernia. Distal stomach is unremarkable. Duodenal sweep takes a normal course across the abdomen. No small bowel dilatation or wall thickening. Mild distal small bowel fecalization. A normal appendix is visualized. Moderate stool burden in the colon. No colonic dilatation or wall thickening. Scattered colonic diverticula without focal pericolonic inflammation to suggest diverticulitis. Vascular/Lymphatic: Atherosclerotic plaque within the normal caliber aorta. No suspicious or enlarged lymph nodes in the  included lymphatic chains. Reproductive: Normal appearance of the uterus and adnexal structures. Other: No free fluid.  No free air.  No bowel containing hernias. Musculoskeletal: Unchanged appearance of an L2 superior endplate compression deformity with approximately 40% height loss superiorly. Prior T12 compression deformity with vertebroplasty changes is also stable. No new fracture or compression deformity is seen. No left though hilar IMPRESSION: Expansile lytic lesions within the sacrum with some endosteal scalloping. Appearance is concerning for possible myeloma versus metastatic disease. Correlation with patient history and serologies is recommended. No obstructive urolithiasis or hydronephrosis. Fluid-filled hiatal hernia, correlate for symptoms of reflux. Small bowel fecalization, correlate for slowed intestinal transit. Stable appearance of an L2 superior endplate compression deformity prior T12 compression deformity with post vertebroplasty changes. Biliary ductal dilatation, greater than expected for age and reservoir effect. Consider liver serologies and if elevated MRCP could be obtained. These results were called by telephone at the time of interpretation on 05/01/2019 at 5:16 am to provider [facility] , who verbally acknowledged these results. Electronically Signed: By: Lovena Le M.D. On: 05/01/2019 05:17   DG Hip Unilat With Pelvis 2-3 Views Right  Result Date: 05/10/2019 CLINICAL DATA:  Right hip pain since a fall in the shower this morning. EXAM: DG HIP (WITH OR WITHOUT PELVIS) 2-3V RIGHT COMPARISON:  Radiographs dated 03/26/2019 FINDINGS: There is no evidence of hip fracture or dislocation. There is no evidence of arthropathy or other focal bone abnormality. IMPRESSION: Negative. Electronically Signed   By: Lorriane Shire M.D.   On: 05/10/2019 14:51   DG FEMUR, MIN 2 VIEWS RIGHT  Result Date: 05/11/2019 CLINICAL DATA:  Right femur fracture fixation. EXAM: RIGHT FEMUR 2 VIEWS; DG C-ARM  1-60 MIN COMPARISON:  Radiographs and CT right femur 05/10/2019 and pelvis 05/01/2019. FINDINGS: C-arm fluoroscopy was provided in the operating room. 2 minutes and 23 seconds of fluoroscopy time. 7 spot fluoroscopic images are submitted. These demonstrate the placement of a right femoral intramedullary nail which is secured by 2 proximal and 4 distal interlocking screws. Final images demonstrate near anatomic reduction of the  main mid diaphyseal fracture fragments. No demonstrated complications. IMPRESSION: Intraoperative views during right femoral fracture ORIF. This fracture is likely pathologic based on recent CT findings. Electronically Signed   By: Richardean Sale M.D.   On: 05/11/2019 12:00   DG Femur Min 2 Views Right  Result Date: 05/10/2019 CLINICAL DATA:  Fall EXAM: RIGHT FEMUR 2 VIEWS COMPARISON:  Distal femur and right knee pain FINDINGS: Displaced spiral fracture of the distal femoral diaphysis with valgus angulation and external rotation of the lower fracture fragment. No proximal femoral fracture is seen. Femoral head remains normally located. Moderate degenerative changes at the hip. No additional fractures seen distal to the diaphyseal fracture site. Dedicated knee radiographs were performed concurrently. Extensive overlying soft tissue swelling without soft tissue gas to suggest radiographic evidence of compound fracture. IMPRESSION: Displaced spiral fracture of the distal femoral diaphysis with valgus angulation and external rotation of the lower fracture fragment. Electronically Signed   By: Lovena Le M.D.   On: 05/10/2019 15:42   DG FEMUR PORT, MIN 2 VIEWS RIGHT  Result Date: 05/11/2019 CLINICAL DATA:  Postop right femoral nail placement EXAM: RIGHT FEMUR PORTABLE 2 VIEW COMPARISON:  None. FINDINGS: Mildly comminuted oblique fracture of the distal femoral diaphysis transfixed with a intramedullary nail and interlocking screws. 6 mm of anterior displacement. No other fracture or  dislocation. Generalized osteopenia. Postsurgical changes in the surrounding soft tissues. IMPRESSION: 1. Mildly comminuted oblique fracture of the distal femoral diaphysis transfixed with an intramedullary nail and interlocking screws. 6 mm of anterior displacement. Electronically Signed   By: Kathreen Devoid   On: 05/11/2019 12:40   Assessment and Plan:  1.  Lytic sacral bone lesions 2.  Anemia, multifactorial including blood loss, B12 deficiency, and likely underlying malignancy 3.  Closed fracture of the right femur 4.  Hypothyroidism 5.  Anxiety 6.  Atrial fibrillation  -Work-up to date has been reviewed.  Also discussed with her primary medical oncologist.  This is felt to be less likely multiple myeloma given her normal SPEP.  The PET scan will not be performed as scheduled tomorrow due to her inpatient status.  Therefore, we will obtain a CT of the chest, abdomen, pelvis and bone scan to further evaluate for a primary site for consideration of biopsy.  Once we have this information, we can make further recommendations regarding biopsy. -The patient is noted to have vitamin B12 deficiency and we will give vitamin B12 1000 mcg IM x1 dose.  Transfuse PRBCs for hemoglobin less than 8. -The patient will discharge to SNF when bed is available for rehabilitation. -Continue management per hospitalist of her other medical conditions.  The patient plans to follow-up at the [facility].  We will assist in arranging follow-up at the time of discharge.  Thank you for this referral.   Mikey Bussing, DNP, AGPCNP-BC, AOCNP  Addendum  I have seen the patient, examined her. I agree with the assessment and and plan and have edited the notes.   79 year old female, with past medical history of A. fib, compression fracture T12, hypertension, anemia, hypothyroidism, presented with left femur fracture after fall.  She was recently seen by my partner Dr. Delton Coombes last week for incidental finding  of sacrum bone lesion.  I have reviewed her lab results and image myself.  Her SPEP was negative for M protein, serum kappa light chain was slightly elevated, normal renal function, no hypercalcemia, overall clinical presentation and lab findings are not suspicious for multiple myeloma.  I do not feel she needs bone marrow biopsy at this point. To rule out metastatic disease to bone, I recommend CT chest, abdomen pelvis with contrast, and a bone scan.  I will discuss with IR to see if they can biopsy the sacral bone lesion or other places, based on the scan findings. She has chronic anemia, worsened after hip surgery secondary to blood loss during surgery.  She has B12 deficiency, will restart her B12 injection.  I spoke with her daughter on the phone during my visit with patient.  All questions answered.  Will follow up.  Truitt Merle  05/15/2019

## 2019-05-15 NOTE — Progress Notes (Signed)
Subjective: Patient resting comfortably in bed, denies pain in the pelvis, lower extremities, or elsewhere.  Lumbar MRI shows old L4 wedge compression fracture.  Sacral MRI shows sacral mass, most consistent with a malignancy.  Radiologist raised the possibilities of multiple myeloma and metastasis.  No previous history of malignancy.  Objective: Vital signs in last 24 hours: Vitals:   05/14/19 2020 05/15/19 0339 05/15/19 0457 05/15/19 0820  BP: 129/68 (!) 126/50  132/60  Pulse: (!) 102 98  97  Resp: _0 Temp: 98 F (36.7 C) 97.9 F (36.6 C)  98.2 F (36.8 C)  TempSrc: Oral Oral  Oral  SpO2: 96% (!) 89% 95% 100%  Weight:      Height:        Intake/Output from previous day: 03/08 0701 - 03/09 0700 In: 180 [P.O.:180] Out: -  Intake/Output this shift: No intake/output data recorded.  Physical Exam: Awake and alert, following commands briskly.  Moving all 4 extremities well.  CBC Recent Labs    05/14/19 0251 05/15/19 0357  WBC 4.9 4.7  HGB 9.5* 9.0*  HCT 29.3* 28.3*  PLT 166 190   BMET Recent Labs    05/14/19 0251 05/15/19 0357  NA 138 138  K 4.1 3.9  CL 102 98  CO2 28 31  GLUCOSE 94 100*  BUN 12 12  CREATININE 0.58 0.54  CALCIUM 7.8* 8.2*   ABG    Component Value Date/Time   TCO2 27 04/07/2015 2002    Studies/Results: MR Lumbar Spine W Wo Contrast  Result Date: 05/14/2019 CLINICAL DATA:  Episodes of lower extremity weakness EXAM: MRI LUMBAR SPINE WITHOUT AND WITH CONTRAST TECHNIQUE: Multiplanar and multiecho pulse sequences of the lumbar spine were obtained without and with intravenous contrast. CONTRAST:  96m GADAVIST GADOBUTROL 1 MMOL/ML IV SOLN COMPARISON:  MRI 2011, CT 2017 FINDINGS: Segmentation:  Standard. Alignment:  Anteroposterior alignment is maintained. Vertebrae: There is new loss of height at the superior endplate of L4 of nearly 50%. There is mild endplate retropulsion. However, there is no marrow edema to suggest acuity. Chronic  compression deformity of T12 with cement augmentation. Marrow signal is mildly heterogeneous likely related to decreased mineralization. Partially imaged STIR hyperintense lesions of the sacrum better evaluated on concurrent dedicated imaging. Conus medullaris and cauda equina: Conus extends to the L2 level. Conus and cauda equina appear normal. No abnormal intrathecal enhancement. Paraspinal and other soft tissues: Left renal cyst. Posterior paraspinal muscle atrophy. Disc levels: L1-L2:  No canal or foraminal stenosis. L2-L3:  No canal or foraminal stenosis. L3-L4: Disc bulge with endplate osteophytic ridging and facet arthropathy with ligamentum flavum infolding. Moderate canal stenosis with partial effacement of the lateral recesses. Mild right and mild to moderate left foraminal stenosis. L4-L5: Disc bulge with superimposed small central protrusion and facet arthropathy with ligamentum flavum infolding. Mild canal stenosis. Mild foraminal stenosis. L5-S1: Disc bulge with endplate osteophytic ridging facet arthropathy. No canal stenosis. Mild left greater than right foraminal stenosis. IMPRESSION: New but chronic appearing compression deformity of L4. Multilevel degenerative changes with some progression since remote study of 2011. Stenosis is greatest at L3-L4. Electronically Signed   By: PMacy MisM.D.   On: 05/14/2019 20:12   MR SACRUM SI JOINTS W WO CONTRAST  Result Date: 05/15/2019 CLINICAL DATA:  Sacral lesions on prior CT examination, for further investigation. EXAM: MRI PELVIS WITHOUT AND WITH CONTRAST TECHNIQUE: Multiplanar multisequence MR imaging of the pelvis was performed both before and after administration of  intravenous contrast. CONTRAST:  27m GADAVIST GADOBUTROL 1 MMOL/ML IV SOLN COMPARISON:  CT pelvis 05/01/2019 FINDINGS: Osseous structures: Expansile likely enhancing 4.3 by 2.8 by 3.3 cm lesion of the right upper sacral ala noted with bulging anterior contour and high precontrast T2  signal. This mildly abuts and displaces the right sacral plexus. Separate but similar left sacral lesion on image 12/15 measures 2.3 by 1.9 by 1.9 cm. A third similar but smaller lesion is present in the right posterior acetabular wall measuring 1.3 by 1.0 by 1.0 cm on image 23/13. Please note that today's exam was focused on the bony sacrum rather than the entire bony pelvis. Musculoskeletal: Normal appearance of the piriformis musculature and visualized iliopsoas musculature. Joints: The larger right sacral lesion abuts the sacral side of the SI joint. No SI joint effusion is identified. Limited assessment of the hip joints demonstrates upper normal amount of fluid. Soft tissues: Lower presacral high T2 and low T1 signal favoring presacral edema, nonspecific. This appears increased compared to the CT examination from 05/01/2019. Sigmoid colon diverticulosis. IMPRESSION: 1. Expansile lesions of the right upper sacral ala, left upper sacrum, and right posterior acetabular wall. The larger right sacral lesion abuts and displaces the right sacral plexus. Top differential diagnostic considerations include myeloma and metastatic disease. 2. No other pelvic masses are identified. 3. Nonspecific presacral edema, increased compared to 05/01/2019. 4. Sigmoid colon diverticulosis. Electronically Signed   By: WVan ClinesM.D.   On: 05/15/2019 08:10    Assessment/Plan: Patient with newly diagnosed malignant sacral mass.  No previous history of malignancy.  Recommendations to hospitalist:  Medical oncology consultation, biopsy of sacral mass by interventional radiology (if feasible), and ultimately may well require radiation oncology consultation.  No role for neurosurgical intervention.  Follow-up with Dr. EEllene Routeas needed.  NHosie Spangle MD 05/15/2019, 9:01 AM

## 2019-05-15 NOTE — Progress Notes (Signed)
Orthopaedic Trauma Progress Note  S: Doing well today, pain well controlled. No concerns or complaints currently. Is about to receive a bath  O:  Vitals:   05/15/19 0339 05/15/19 0457  BP: (!) 126/50   Pulse: 98   Resp: 15   Temp: 97.9 F (36.6 C)   SpO2: (!) 89% 95%    General - Laying in bed, NAD Respiratory - No increased work of breathing.  Right Lower Extremity - Incisions clean, dry, intact.  Steri-Strips in place but starting to peel off.  No significant tenderness with palpation throughout extremity.  Ankle dorsiflexion/plantarflexion is intact.  Sensation intact to light touch distally. Compartments are soft and compressible. Foot is cool but equal to contralateral side. Neurovascularly intact.  Imaging: Stable post op imaging.   Labs:  Results for orders placed or performed during the hospital encounter of 05/10/19 (from the past 24 hour(s))  Comprehensive metabolic panel     Status: Abnormal   Collection Time: 05/15/19  3:57 AM  Result Value Ref Range   Sodium 138 135 - 145 mmol/L   Potassium 3.9 3.5 - 5.1 mmol/L   Chloride 98 98 - 111 mmol/L   CO2 31 22 - 32 mmol/L   Glucose, Bld 100 (H) 70 - 99 mg/dL   BUN 12 8 - 23 mg/dL   Creatinine, Ser 0.54 0.44 - 1.00 mg/dL   Calcium 8.2 (L) 8.9 - 10.3 mg/dL   Total Protein 4.7 (L) 6.5 - 8.1 g/dL   Albumin 2.1 (L) 3.5 - 5.0 g/dL   AST 19 15 - 41 U/L   ALT 9 0 - 44 U/L   Alkaline Phosphatase 78 38 - 126 U/L   Total Bilirubin 0.9 0.3 - 1.2 mg/dL   GFR calc non Af Amer >60 >60 mL/min   GFR calc Af Amer >60 >60 mL/min   Anion gap 9 5 - 15  Magnesium     Status: None   Collection Time: 05/15/19  3:57 AM  Result Value Ref Range   Magnesium 1.8 1.7 - 2.4 mg/dL  CBC     Status: Abnormal   Collection Time: 05/15/19  3:57 AM  Result Value Ref Range   WBC 4.7 4.0 - 10.5 K/uL   RBC 3.13 (L) 3.87 - 5.11 MIL/uL   Hemoglobin 9.0 (L) 12.0 - 15.0 g/dL   HCT 28.3 (L) 36.0 - 46.0 %   MCV 90.4 80.0 - 100.0 fL   MCH 28.8 26.0 -  34.0 pg   MCHC 31.8 30.0 - 36.0 g/dL   RDW 15.1 11.5 - 15.5 %   Platelets 190 150 - 400 K/uL   nRBC 0.0 0.0 - 0.2 %    Assessment: 80 year old female status post fall, 4 Days Post-Op   Injuries: Right femoral shaft fracture status post retrograde IM nail  Weightbearing: WBAT RLE  Insicional and dressing care: Incisions can be left open to air  Showering: Okay to begin showering and getting incisions wet.  Orthopedic device(s): None   CV/Blood loss: Acute blood loss anemia, Hgb 9.0 this morning. Hemodynamically stable. Received 1 unit PRBCs 05/13/2019.   Pain management:  1. Tylenol 1000 mg q 6 hours scheduled 2. Robaxin 500 mg q 6 hours PRN 3. Oxycodone 5-10 mg q 4 hours PRN 4. Morphine 1-2 mg q 2 hours PRN  VTE prophylaxis:  Lovenox SCDs: Ordered, not currently in place. Will be re-applied after bath  ID:  Ancef 2gm post op completed  Foley/Lines: No foley.  KVO IVFs  Medical co-morbidities: atrial fibrillation, compression fracture T12 s/p kyphoplasty, HTN, Anemia, Hypothyroidism, Anxiety  Impediments to Fracture Healing: Vitamin D level 22, continue D3 supplementation  Dispo: Up with therapies as tolerated. PT/OT recommending SNF. Discharge once bed available and cleared by medicine team and neurosurgery.  Follow - up plan: 2 weeks for wound check and repeat x-rays  Contact information:  Katha Hamming MD, Patrecia Pace PA-C   [facility]erber A[facility] [phone] (office) [email or web address]

## 2019-05-15 NOTE — Progress Notes (Signed)
Physical Therapy Treatment Patient Details Name: Courtney Arnold MRN: [id number] DOB: [birth year] Today's Date: 05/15/2019    History of Present Illness Patient is a 79 y/o female presenting to the ED on 05/10/19 after a fall at home with R femur fx. S/p IM nail of R femur on 05/11/19 with patient currently WBAT. Patient with a PMH significant for AFib, T12 kyphoplasty, HTN, recent lytic lesions found on sacrum with continued work-up.     PT Comments    Pt demonstrating improved transfers and gait.  She was able to progress to min A of 1 with increased time and cues.  Required cues for weight shift, R LE management, and use of RW.     Follow Up Recommendations  SNF;Supervision/Assistance - 24 hour     Equipment Recommendations  Rolling walker with 5" wheels    Recommendations for Other Services       Precautions / Restrictions Precautions Precautions: Fall Restrictions RLE Weight Bearing: Weight bearing as tolerated    Mobility  Bed Mobility Overal bed mobility: Needs Assistance Bed Mobility: Sit to Supine       Sit to supine: Mod assist;HOB elevated   General bed mobility comments: Assist for R LE and to boost trunk  Transfers Overall transfer level: Needs assistance Equipment used: Rolling walker (2 wheeled) Transfers: Sit to/from Stand Sit to Stand: Min assist Stand pivot transfers: Mod assist       General transfer comment: Partial stand pivot to [facility] as pt in hurry to use bathroom - required more assist.  Then sit to stand from bsc with min A and cues for hand placement  Ambulation/Gait Ambulation/Gait assistance: Min assist Gait Distance (Feet): 6 Feet Assistive device: Rolling walker (2 wheeled) Gait Pattern/deviations: Step-to pattern;Decreased stance time - right Gait velocity: decreased   General Gait Details: cues for sequence and use of RW   Stairs             Wheelchair Mobility    Modified Rankin (Stroke Patients Only)       Balance  Overall balance assessment: Needs assistance Sitting-balance support: Bilateral upper extremity supported;Feet supported Sitting balance-Leahy Scale: Fair     Standing balance support: Bilateral upper extremity supported;During functional activity Standing balance-Leahy Scale: Poor Standing balance comment: reliant of RW, tendency to posterior lean initially                            Cognition Arousal/Alertness: Awake/alert Behavior During Therapy: WFL for tasks assessed/performed Overall Cognitive Status: Within Functional Limits for tasks assessed                                        Exercises      General Comments General comments (skin integrity, edema, etc.): on 2 LPM O2 with VSS.  Pt required mod A for toileting ADLs      Pertinent Vitals/Pain Pain Assessment: 0-10 Pain Score: 3  Pain Location: R LE with mobility Pain Descriptors / Indicators: Aching;Discomfort;Grimacing;Guarding Pain Intervention(s): Limited activity within patient's tolerance;Monitored during session;Repositioned    Home Living                      Prior Function            PT Goals (current goals can now be found in the care plan section) Progress towards PT goals:  Progressing toward goals    Frequency    Min 3X/week      PT Plan Current plan remains appropriate    Co-evaluation              AM-PAC PT "6 Clicks" Mobility   Outcome Measure  Help needed turning from your back to your side while in a flat bed without using bedrails?: A Lot Help needed moving from lying on your back to sitting on the side of a flat bed without using bedrails?: A Lot Help needed moving to and from a bed to a chair (including a wheelchair)?: A Lot Help needed standing up from a chair using your arms (e.g., wheelchair or bedside chair)?: A Lot Help needed to walk in hospital room?: A Lot Help needed climbing 3-5 steps with a railing? : A Lot 6 Click Score:  12    End of Session Equipment Utilized During Treatment: Gait belt;Oxygen Activity Tolerance: Patient tolerated treatment well Patient left: in chair;with call bell/phone within reach;with chair alarm set Nurse Communication: Mobility status(nurse tech present - aware pt had BM) PT Visit Diagnosis: Unsteadiness on feet (R26.81);Other abnormalities of gait and mobility (R26.89);Muscle weakness (generalized) (M62.81)     Time: [id number] PT Time Calculation (min) (ACUTE ONLY): 31 min  Charges:  $Gait Training: 8-22 mins $Therapeutic Activity: 8-22 mins                     Maggie Font, PT Acute Rehab Services Pager [phone] [facility] [phone] [facility] [phone]    Karlton Lemon 05/15/2019, 5:28 PM

## 2019-05-15 NOTE — Plan of Care (Signed)
Plan of care reviewed with pt at bedside. Vivien Rota, daughter updated. PET scan cancelled after speaking with MD Sherral Hammers. Oncology and Neurology consulted. Pt on 2L [place] on cpo. Fall bundle in place, call bell in reach. Pt stable at this time, will continue to monitor.  Problem: Education: Goal: Knowledge of General Education information will improve Description: Including pain rating scale, medication(s)/side effects and non-pharmacologic comfort measures Outcome: Progressing   Problem: Health Behavior/Discharge Planning: Goal: Ability to manage health-related needs will improve Outcome: Progressing   Problem: Clinical Measurements: Goal: Ability to maintain clinical measurements within normal limits will improve Outcome: Progressing Goal: Will remain free from infection Outcome: Progressing Goal: Diagnostic test results will improve Outcome: Progressing Goal: Respiratory complications will improve Outcome: Progressing Goal: Cardiovascular complication will be avoided Outcome: Progressing   Problem: Activity: Goal: Risk for activity intolerance will decrease Outcome: Progressing   Problem: Nutrition: Goal: Adequate nutrition will be maintained Outcome: Progressing   Problem: Coping: Goal: Level of anxiety will decrease Outcome: Progressing   Problem: Elimination: Goal: Will not experience complications related to bowel motility Outcome: Progressing Goal: Will not experience complications related to urinary retention Outcome: Progressing   Problem: Pain Managment: Goal: General experience of comfort will improve Outcome: Progressing   Problem: Safety: Goal: Ability to remain free from injury will improve Outcome: Progressing   Problem: Skin Integrity: Goal: Risk for impaired skin integrity will decrease Outcome: Progressing

## 2019-05-15 NOTE — Consult Note (Signed)
Medical Consultation   Courtney Arnold  GYB:[id number]  DOB: 20-May-1940  DOA: 05/10/2019  PCP: [name], MD    Requesting physician: Dr Rod Can orthopedic surgery  Reason for consultation: Medical clearance   History of Present Illness: Courtney Arnold is an 79 y.o. female WF PMHx atrial fibrillation, compression fracture T12 s/p kyphoplasty, HTN, Anemia, Hypothyroidism, Anxiety.  Is being evaluated for lower back pain, bilateral lower extremity weakness, what patient describes as legs giving out causing her to fall.  Patient was due to have a PET scan next week for further evaluation by Dr. Ellene Route neurosurgery.  3/9 A/O x4, negative CP, negative S OB, patient aware of findings of MRI.    Review of Systems:  Review of Systems  Constitutional: Negative.   HENT: Negative.   Eyes: Negative.   Respiratory: Negative.   Cardiovascular: Negative.   Gastrointestinal: Negative.   Genitourinary: Negative.   Musculoskeletal: Positive for back pain, falls and joint pain. Negative for myalgias and neck pain.  Skin: Negative.   Neurological: Positive for dizziness, tingling, tremors, sensory change, speech change, seizures, loss of consciousness and headaches. Negative for focal weakness and weakness.  Endo/Heme/Allergies: Negative.   Psychiatric/Behavioral: Negative.     Past Medical History: Past Medical History:  Diagnosis Date  . Atrial fibrillation ([place]) 2011   Postop, spontaneous conversion to normal sinus after one hour  . Compression fracture 07/24/09   T12; kyphoplasty  . History of echocardiogram 5/11   EF 65%  . Hypertension   . Thyroid disease   . Tobacco abuse     Past Surgical History: Past Surgical History:  Procedure Laterality Date  . BACK SURGERY    . BACK SURGERY  06/06/2015  . BREAST EXCISIONAL BIOPSY Left    50 years ago  benign  . CHOLECYSTECTOMY N/A 04/25/2015   Procedure: LAPAROSCOPIC CHOLECYSTECTOMY WITH INTRAOPERATIVE  CHOLANGIOGRAM;  Surgeon: Mickeal Skinner, MD;  Location: [facility];  Service: General;  Laterality: N/A;  . COLONOSCOPY N/A 11/26/2015   Procedure: COLONOSCOPY;  Surgeon: Daneil Dolin, MD;  Location: [facility];  Service: Endoscopy;  Laterality: N/A;  7:30 am  . ERCP N/A 04/14/2015   Procedure: ENDOSCOPIC RETROGRADE CHOLANGIOPANCREATOGRAPHY (ERCP) Biliary Sphincterotomy, 10x7 stent placement Dilated bilary system just not well seen;  Surgeon: Rogene Houston, MD;  Location: [facility];  Service: Endoscopy;  Laterality: N/A;  . ERCP N/A 06/12/2015   Procedure: ENDOSCOPIC RETROGRADE CHOLANGIOPANCREATOGRAPHY (ERCP);  Surgeon: Rogene Houston, MD;  Location: [facility];  Service: Endoscopy;  Laterality: N/A;  . ESOPHAGOGASTRODUODENOSCOPY N/A 06/12/2015   Procedure: DIAGNOSTIC ESOPHAGOGASTRODUODENOSCOPY (EGD);  Surgeon: Rogene Houston, MD;  [facility];  Service: Endoscopy;  Laterality: N/A;  . FEMUR IM NAIL Right 05/11/2019   Procedure: RETROGRADE INTRAMEDULLARY NAIL FEMORAL;  Surgeon: Shona Needles, MD;  Location: [place];  Service: Orthopedics;  Laterality: Right;  . STENT REMOVAL  06/12/2015   Procedure: STENT REMOVAL ;  Surgeon: Rogene Houston, MD;  Location: [facility];  Service: Endoscopy;;     Allergies:  No Known Allergies   Social History:  reports that she quit smoking about 6 years ago. She has a 3.00 pack-year smoking history. She has never used smokeless tobacco. She reports that she does not drink alcohol or use drugs.   Family History: Family History  Problem Relation Age of Onset  . Heart attack Father   .  Stroke Father   . Breast cancer Sister   . Thyroid disease Neg Hx   . Colon cancer Neg Hx       Procedures/Significant Events:  2/23 CT renal stone study; Expansile lytic lesions are present within the sacrum including a 4.2 cm lesion in the right sacral ala which extends into the right SI joint and a smaller infiltrating lytic focus in the left sacral  ala measuring up to 2.4 cm (5/63) and a separate focus more posteriorly measuring 1.9 cm (5/69, heterogeneous marrow elsewhere in the sacrum is conspicuous as well and worrisome for additional site of osseous involvement. -Stable appearance of an L2 superior endplate compression deformity prior T12 compression deformity with post vertebroplasty changes. -Biliary ductal dilatation, greater than expected for age and reservoir effect. Consider liver serologies and if elevated MRCP could be obtained. 3/4 CXR;New compression fracture of T10 since 06/11/2015. No acute cardiopulmonary abnormalities. 3/4 DG femur RIGHT;Displaced spiral fracture of the distal femoral diaphysis with valgus angulation and external rotation of the lower fracture fragment 3/8 MRI Lumbosacral;  Expansile likely enhancing 4.3 by 2.8 by 3.3 cm lesion of the right upper sacral ala noted with bulging anterior contour and high precontrast T2 signal. This mildly abuts and displaces the right sacral plexus. -Separate but similar left sacral lesion measures 2.3 by 1.9 by 1.9 cm. -A third similar but smaller lesion is present in the right posterior acetabular wall measuring 1.3 by 1.0 by 1.0 cm on image 23/13. -Sigmoid colon diverticulosis. -Vertebrae: There is new loss of height at the superior endplate of L4 of nearly 50%. There is mild endplate retropulsion. However,there is no marrow edema to suggest acuity. -Chronic compression deformity of T12 with cement augmentation. Marrow signal is mildly heterogeneous likely related to decreased mineralization.  -L3-L4: Disc bulge with endplate osteophytic ridging and facet arthropathy with ligamentum flavum infolding. Moderate canal stenosis with partial effacement of the lateral recesses. Mild right and mild to moderate left foraminal stenosis.  L4-L5: Disc bulge with superimposed small central protrusion and facet arthropathy with ligamentum flavum infolding. Mild canal stenosis. Mild  foraminal stenosis.  L5-S1: Disc bulge with endplate osteophytic ridging facet arthropathy. No canal stenosis. Mild left greater than right foraminal stenosis.   I have personally reviewed and interpreted all radiology studies and my findings are as above.     Continuous Infusions: . sodium chloride 125 mL/hr at 05/15/19 0931  . methocarbamol (ROBAXIN) IV       Physical Exam: Vitals:   05/14/19 2020 05/15/19 0339 05/15/19 0457 05/15/19 0820  BP: 129/68 (!) 126/50  132/60  Pulse: (!) 102 98  97  Resp: _0 Temp: 98 F (36.7 C) 97.9 F (36.6 C)  98.2 F (36.8 C)  TempSrc: Oral Oral  Oral  SpO2: 96% (!) 89% 95% 100%  Weight:      Height:       Physical Exam:  General: A/O x4, no acute respiratory distress Eyes: negative scleral hemorrhage, negative anisocoria, negative icterus ENT: Negative Runny nose, negative gingival bleeding, Neck:  Negative scars, masses, torticollis, lymphadenopathy, JVD Lungs: Clear to auscultation bilaterally without wheezes or crackles Cardiovascular: Regular rate and rhythm without murmur gallop or rub normal S1 and S2 Abdomen: negative abdominal pain, nondistended, positive soft, bowel sounds, no rebound, no ascites, no appreciable mass Extremities: POSITIVE swelling RIGHT thigh improved.   Skin: Negative rashes, lesions, ulcers Psychiatric: Positive depression, negative anxiety, negative fatigue, negative mania Central nervous system:  Cranial nerves II  through XII intact, tongue/uvula midline, all extremities muscle strength 5/5, sensation intact throughout, negative dysarthria, negative expressive aphasia, negative receptive aphasia.  Data reviewed:  I have personally reviewed following labs and imaging studies Labs:  CBC: Recent Labs  Lab 05/08/19 1421 05/08/19 1421 05/10/19 1459 05/10/19 1459 05/11/19 0503 05/11/19 2132 05/12/19 0452 05/12/19 1224 05/13/19 0407 05/14/19 0251 05/15/19 0357  WBC 4.8   < > 6.2  --  4.1   --   --   --  3.6* 4.9 4.7  NEUTROABS 2.7  --  4.8  --  2.5  --   --   --   --   --   --   HGB 8.5*   < > 6.9*   < > 10.4*   < > 7.1* 7.5* 7.0* 9.5* 9.0*  HCT 27.8*   < > 21.9*   < > 31.8*   < > 22.3* 23.1* 22.5* 29.3* 28.3*  MCV 90.8   < > 88.3  --  86.6  --   --   --  89.3 89.3 90.4  PLT 184   < > 218  --  154  --   --   --  140* 166 190   < > = values in this interval not displayed.    Basic Metabolic Panel: Recent Labs  Lab 05/11/19 0503 05/11/19 0503 05/12/19 0602 05/12/19 0602 05/13/19 0407 05/13/19 0407 05/14/19 0251 05/15/19 0357  NA 136  --  136  --  137  --  138 138  K 3.8   < > 3.7   < > 3.4*   < > 4.1 3.9  CL 99  --  101  --  100  --  102 98  CO2 29  --  28  --  28  --  28 31  GLUCOSE 104*  --  93  --  85  --  94 100*  BUN 21  --  18  --  14  --  12 12  CREATININE 0.68  --  0.70  --  0.60  --  0.58 0.54  CALCIUM 8.4*  --  8.0*  --  7.4*  --  7.8* 8.2*  MG 1.9  --  1.7  --  2.2  --  1.9 1.8   < > = values in this interval not displayed.   GFR Estimated Creatinine Clearance: 46.6 mL/min (by C-G formula based on SCr of 0.54 mg/dL). Liver Function Tests: Recent Labs  Lab 05/11/19 0503 05/12/19 0602 05/13/19 0407 05/14/19 0251 05/15/19 0357  AST 15 14* _0 ALT _1 ALKPHOS 67 47 56 71 78  BILITOT 0.5 0.4 0.5 1.1 0.9  PROT 5.1* 4.3* 4.2* 4.6* 4.7*  ALBUMIN 2.6* 2.3* 2.0* 2.2* 2.1*   No results for input(s): LIPASE, AMYLASE in the last 168 hours. No results for input(s): AMMONIA in the last 168 hours. Coagulation profile Recent Labs  Lab 05/10/19 1459 05/11/19 0503  INR 1.0 1.0    Cardiac Enzymes: Recent Labs  Lab 05/10/19 1459  CKTOTAL 109   BNP: Invalid input(s): POCBNP CBG: No results for input(s): GLUCAP in the last 168 hours. D-Dimer No results for input(s): DDIMER in the last 72 hours. Hgb A1c No results for input(s): HGBA1C in the last 72 hours. Lipid Profile No results for input(s): CHOL, HDL, LDLCALC, TRIG, CHOLHDL,  LDLDIRECT in the last 72 hours. Thyroid function studies No results for input(s): TSH, T4TOTAL, T3FREE, THYROIDAB  in the last 72 hours.  Invalid input(s): FREET3 Anemia work up No results for input(s): VITAMINB12, FOLATE, FERRITIN, TIBC, IRON, RETICCTPCT in the last 72 hours. Urinalysis    Component Value Date/Time   COLORURINE YELLOW 05/11/2019 [place] 05/11/2019 0819   LABSPEC 1.021 05/11/2019 0819   PHURINE 5.0 05/11/2019 0819   GLUCOSEU NEGATIVE 05/11/2019 0819   HGBUR MODERATE (A) 05/11/2019 0819   BILIRUBINUR NEGATIVE 05/11/2019 0819   KETONESUR NEGATIVE 05/11/2019 0819   PROTEINUR NEGATIVE 05/11/2019 0819   UROBILINOGEN 0.2 10/02/2008 0830   NITRITE NEGATIVE 05/11/2019 0819   LEUKOCYTESUR NEGATIVE 05/11/2019 0819     Microbiology Recent Results (from the past 240 hour(s))  Respiratory Panel by RT PCR (Flu A&B, Covid) - Nasopharyngeal Swab     Status: None   Collection Time: 05/10/19  3:50 PM   Specimen: Nasopharyngeal Swab  Result Value Ref Range Status   SARS Coronavirus 2 by RT PCR NEGATIVE NEGATIVE Final    Comment: (NOTE) SARS-CoV-2 target nucleic acids are NOT DETECTED. The SARS-CoV-2 RNA is generally detectable in upper respiratoy specimens during the acute phase of infection. The lowest concentration of SARS-CoV-2 viral copies this assay can detect is 131 copies/mL. A negative result does not preclude SARS-Cov-2 infection and should not be used as the sole basis for treatment or other patient management decisions. A negative result may occur with  improper specimen collection/handling, submission of specimen other than nasopharyngeal swab, presence of viral mutation(s) within the areas targeted by this assay, and inadequate number of viral copies (<131 copies/mL). A negative result must be combined with clinical observations, patient history, and epidemiological information. The expected result is Negative. Fact Sheet for Patients:    PinkCheek.be Fact Sheet for Healthcare Providers:  GravelBags.it This test is not yet ap proved or cleared by the [place] FDA and  has been authorized for detection and/or diagnosis of SARS-CoV-2 by FDA under an Emergency Use Authorization (EUA). This EUA will remain  in effect (meaning this test can be used) for the duration of the COVID-19 declaration under Section 564(b)(1) of the Act, 21 U.S.C. section 360bbb-3(b)(1), unless the authorization is terminated or revoked sooner.    Influenza A by PCR NEGATIVE NEGATIVE Final   Influenza B by PCR NEGATIVE NEGATIVE Final    Comment: (NOTE) The Xpert Xpress SARS-CoV-2/FLU/RSV assay is intended as an aid in  the diagnosis of influenza from Nasopharyngeal swab specimens and  should not be used as a sole basis for treatment. Nasal washings and  aspirates are unacceptable for Xpert Xpress SARS-CoV-2/FLU/RSV  testing. Fact Sheet for Patients: PinkCheek.be Fact Sheet for Healthcare Providers: GravelBags.it This test is not yet approved or cleared by the [place] FDA and  has been authorized for detection and/or diagnosis of SARS-CoV-2 by  FDA under an Emergency Use Authorization (EUA). This EUA will remain  in effect (meaning this test can be used) for the duration of the  Covid-19 declaration under Section 564(b)(1) of the Act, 21  U.S.C. section 360bbb-3(b)(1), unless the authorization is  terminated or revoked. Performed at [facility], [place] [address], [place], [place] [address]   MRSA PCR Screening     Status: None   Collection Time: 05/10/19  9:57 PM   Specimen: Nasal Mucosa; Nasopharyngeal  Result Value Ref Range Status   MRSA by PCR NEGATIVE NEGATIVE Final    Comment:        The GeneXpert MRSA Assay (FDA approved for NASAL specimens only),  is one component of a comprehensive MRSA  colonization surveillance program. It is not intended to diagnose MRSA infection nor to guide or monitor treatment for MRSA infections. Performed at [facility], [place] [address], [place], [place] [address]        Inpatient Medications:   Scheduled Meds: . sodium chloride   Intravenous Once  . acetaminophen  1,000 mg Oral Q6H  . ALPRAZolam  0.5 mg Oral QHS  . Chlorhexidine Gluconate Cloth  6 each Topical Daily  . cholecalciferol  2,000 Units Oral BID  . enoxaparin (LOVENOX) injection  40 mg Subcutaneous Q24H  . levothyroxine  100 mcg Oral Q0600  . polyvinyl alcohol  1 drop Both Eyes QID   Continuous Infusions: . sodium chloride 125 mL/hr at 05/15/19 0931  . methocarbamol (ROBAXIN) IV       Radiological Exams on Admission: MR Lumbar Spine W Wo Contrast  Result Date: 05/14/2019 CLINICAL DATA:  Episodes of lower extremity weakness EXAM: MRI LUMBAR SPINE WITHOUT AND WITH CONTRAST TECHNIQUE: Multiplanar and multiecho pulse sequences of the lumbar spine were obtained without and with intravenous contrast. CONTRAST:  35m GADAVIST GADOBUTROL 1 MMOL/ML IV SOLN COMPARISON:  MRI 2011, CT 2017 FINDINGS: Segmentation:  Standard. Alignment:  Anteroposterior alignment is maintained. Vertebrae: There is new loss of height at the superior endplate of L4 of nearly 50%. There is mild endplate retropulsion. However, there is no marrow edema to suggest acuity. Chronic compression deformity of T12 with cement augmentation. Marrow signal is mildly heterogeneous likely related to decreased mineralization. Partially imaged STIR hyperintense lesions of the sacrum better evaluated on concurrent dedicated imaging. Conus medullaris and cauda equina: Conus extends to the L2 level. Conus and cauda equina appear normal. No abnormal intrathecal enhancement. Paraspinal and other soft tissues: Left renal cyst. Posterior paraspinal muscle atrophy. Disc levels: L1-L2:  No canal or foraminal stenosis. L2-L3:  No  canal or foraminal stenosis. L3-L4: Disc bulge with endplate osteophytic ridging and facet arthropathy with ligamentum flavum infolding. Moderate canal stenosis with partial effacement of the lateral recesses. Mild right and mild to moderate left foraminal stenosis. L4-L5: Disc bulge with superimposed small central protrusion and facet arthropathy with ligamentum flavum infolding. Mild canal stenosis. Mild foraminal stenosis. L5-S1: Disc bulge with endplate osteophytic ridging facet arthropathy. No canal stenosis. Mild left greater than right foraminal stenosis. IMPRESSION: New but chronic appearing compression deformity of L4. Multilevel degenerative changes with some progression since remote study of 2011. Stenosis is greatest at L3-L4. Electronically Signed   By: PMacy MisM.D.   On: 05/14/2019 20:12   MR SACRUM SI JOINTS W WO CONTRAST  Result Date: 05/15/2019 CLINICAL DATA:  Sacral lesions on prior CT examination, for further investigation. EXAM: MRI PELVIS WITHOUT AND WITH CONTRAST TECHNIQUE: Multiplanar multisequence MR imaging of the pelvis was performed both before and after administration of intravenous contrast. CONTRAST:  552mGADAVIST GADOBUTROL 1 MMOL/ML IV SOLN COMPARISON:  CT pelvis 05/01/2019 FINDINGS: Osseous structures: Expansile likely enhancing 4.3 by 2.8 by 3.3 cm lesion of the right upper sacral ala noted with bulging anterior contour and high precontrast T2 signal. This mildly abuts and displaces the right sacral plexus. Separate but similar left sacral lesion on image 12/15 measures 2.3 by 1.9 by 1.9 cm. A third similar but smaller lesion is present in the right posterior acetabular wall measuring 1.3 by 1.0 by 1.0 cm on image 23/13. Please note that today's exam was focused on the bony sacrum rather than the entire bony  pelvis. Musculoskeletal: Normal appearance of the piriformis musculature and visualized iliopsoas musculature. Joints: The larger right sacral lesion abuts the sacral  side of the SI joint. No SI joint effusion is identified. Limited assessment of the hip joints demonstrates upper normal amount of fluid. Soft tissues: Lower presacral high T2 and low T1 signal favoring presacral edema, nonspecific. This appears increased compared to the CT examination from 05/01/2019. Sigmoid colon diverticulosis. IMPRESSION: 1. Expansile lesions of the right upper sacral ala, left upper sacrum, and right posterior acetabular wall. The larger right sacral lesion abuts and displaces the right sacral plexus. Top differential diagnostic considerations include myeloma and metastatic disease. 2. No other pelvic masses are identified. 3. Nonspecific presacral edema, increased compared to 05/01/2019. 4. Sigmoid colon diverticulosis. Electronically Signed   By: Van Clines M.D.   On: 05/15/2019 08:10    Impression/Recommendations Principal Problem:   Closed fracture of right femur (HCC) Active Problems:   TOBACCO ABUSE   Compression fracture of L2 lumbar vertebra (HCC)   Hypothyroidism   Unspecified atrial fibrillation (HCC)   Anxiety   Anemia, unspecified   Sacral lytic lesions  Closed fracture RIGHT femur -Repair per orthopedic surgery. -Most likely site of significant loss of blood.  Per patient and daughter no history of anemia, and colonoscopy 2018 was clear.  Negative history of gastric ulcers. -Although patient has some symptoms that could be consistent with multiple myeloma patient should be low risk for surgery, given negative cardiac history (previous past history of A. fib not treated), or respiratory history.  Compression fracture L2 lumbar vertebrae/Sacral lytic lesions -Patient has multiple sacral lytic lesions and new L2 lumbar vertebrae fracture, which may have been the cause/contributed to patient's initial fall.  Patient was seeing Dr. Ellene Route neurosurgery for work-up.  If patient is to be in hospital for several days post femur repair recommend consulting Dr.  Ellene Route -Lesions could be consistent with multiple myeloma -UPEP pending -SPEP pending -Kappa/lambda free light chain pending -3/8 discussed case with Dr. Ellene Route neurosurgery who stated he would see patient.  Requested that patient obtain MRI of L/S spine -We will await his recommendations  Acute respiratory failure with hypoxia SATURATION QUALIFICATIONS: (This note is used to comply with regulatory documentation for home oxygen) Patient Saturations on Room Air at Rest = 90% Patient Saturations on Room Air while Ambulating = 81% Patient Saturations on 2.5 Liters of oxygen while Ambulating = 93% Please briefly explain why patient needs home oxygen: Patient requires supplemental oxygen to maintain oxygen saturations at acceptable, safe levels with physical activity. -Patient meets criteria for home O2 -2 L O2 via [place], titrate to maintain SPO2> 88% -Provide [employer] home O2 portable concentrator  Unspecified atrial fibrillation -Currently NSR -Not on home medication for A. fib. -Would monitor closely given stress of current situation though patient has not been in A. fib may cause her to lapse into atrial fibrillation. -3/4 fecal occult negative -3/7 transfuse 1 unit PRBC -Transfuse for hemoglobin<7  Anemia -See closed fracture -3/4 fecal occult blood negative   Hypothyroidism -Synthroid 100 mcg daily  Hypomagnesmia -Magnesium goal> 2  Hypokalemia -Potassium goal> 4  Anxiety -Xanax 0.5 mg qhs  Goals of care 3/9 have spoken with IR and Dr. Burr Medico oncology.  Dr. Burr Medico will see patient and has ordered multiple additional studies in an effort to identify the best area to obtain a tissue sample for pathology. -Would recommend that patient not be discharged for orthopedic rehabilitation until neoplasm has been identified and I plan  of treatment developed.   Thank you for this consultation.  Our [facility] hospitalist team will follow the patient with you.   Time  Spent: 40-minute  Karron Goens, Geraldo Docker M.D. [facility] 05/15/2019, 9:39 AM  [phone]

## 2019-05-15 NOTE — Progress Notes (Signed)
Please be advised that the above-named patient has a primary diagnosis of dementia which supersedes any psychiatric diagnoses and will require a short-term nursing home stay - anticipated 30 days or less for rehabilitation and strengthening.  The plan is for return home.  Whitman Hero RN,BSN,CM

## 2019-05-16 ENCOUNTER — Ambulatory Visit (HOSPITAL_COMMUNITY): Admission: RE | Admit: 2019-05-16 | Payer: Medicare Other | Source: Ambulatory Visit

## 2019-05-16 ENCOUNTER — Inpatient Hospital Stay (HOSPITAL_COMMUNITY): Payer: Medicare Other

## 2019-05-16 LAB — CBC
HCT: 28.9 % — ABNORMAL LOW (ref 36.0–46.0)
Hemoglobin: 9.2 g/dL — ABNORMAL LOW (ref 12.0–15.0)
MCH: 28.8 pg (ref 26.0–34.0)
MCHC: 31.8 g/dL (ref 30.0–36.0)
MCV: 90.3 fL (ref 80.0–100.0)
Platelets: 195 10*3/uL (ref 150–400)
RBC: 3.2 MIL/uL — ABNORMAL LOW (ref 3.87–5.11)
RDW: 14.9 % (ref 11.5–15.5)
WBC: 3.5 10*3/uL — ABNORMAL LOW (ref 4.0–10.5)
nRBC: 0 % (ref 0.0–0.2)

## 2019-05-16 LAB — IMMUNOFIXATION ELECTROPHORESIS
IgA: 487 mg/dL — ABNORMAL HIGH (ref 64–422)
IgG (Immunoglobin G), Serum: 435 mg/dL — ABNORMAL LOW (ref 586–1602)
IgM (Immunoglobulin M), Srm: 23 mg/dL — ABNORMAL LOW (ref 26–217)
Total Protein ELP: 5.8 g/dL — ABNORMAL LOW (ref 6.0–8.5)

## 2019-05-16 MED ORDER — HYDROCODONE-ACETAMINOPHEN 5-325 MG PO TABS
1.0000 | ORAL_TABLET | ORAL | Status: DC | PRN
  Administered 2019-05-20 – 2019-05-21 (×2): 1 via ORAL
  Filled 2019-05-16 (×2): qty 1

## 2019-05-16 MED ORDER — TECHNETIUM TC 99M MEDRONATE IV KIT
20.0000 | PACK | Freq: Once | INTRAVENOUS | Status: AC | PRN
  Administered 2019-05-16: 20 via INTRAVENOUS

## 2019-05-16 MED ORDER — FUROSEMIDE 10 MG/ML IJ SOLN
60.0000 mg | Freq: Once | INTRAMUSCULAR | Status: AC
  Administered 2019-05-16: 60 mg via INTRAVENOUS
  Filled 2019-05-16: qty 6

## 2019-05-16 MED ORDER — ENOXAPARIN SODIUM 40 MG/0.4ML ~~LOC~~ SOLN
40.0000 mg | SUBCUTANEOUS | Status: DC
  Administered 2019-05-18 – 2019-05-21 (×4): 40 mg via SUBCUTANEOUS
  Filled 2019-05-16 (×4): qty 0.4

## 2019-05-16 NOTE — Progress Notes (Signed)
Patient is refusing to give password, friends and family will call her on her phone if they want to talk to her.

## 2019-05-16 NOTE — Progress Notes (Signed)
Oncology brief note   I have reviewed her CT and bone scan, no evidence of primary tumor on CT. I recommend IR biopsy of her bone lesion. Order placed. I called pt and spoke with her daughter Vivien Rota, she voiced understanding and agrees to proceed.   I will discuss with IR. Hopefully the biopsy will not delay her rehab placement.   Truitt Merle  05/16/2019

## 2019-05-16 NOTE — Progress Notes (Signed)
Patient ID: Courtney Arnold, female   DOB: 05-08-1940, 79 y.o.   MRN: [id number]  PROGRESS NOTE    Courtney Arnold  [id number] DOB: [birth year] DOA: 05/10/2019 PCP: [name], MD   Brief Narrative:  79 year old female with history of unspecified atrial fibrillation, compression fracture T12 status post kyphoplasty, hypertension, anemia, hypothyroidism, anxiety presented with lower back pain and bilateral lower extremity weakness causing her to fall.  She was found to have right femoral shaft fracture for which she was admitted under orthopedic service and underwent IM nailing.  She was also found to have compression L2 lumbar vertebral fracture along with sacral lytic lesions.  Hospitalist, neurosurgery and oncology services were consulted.  Assessment & Plan:   Acute hypoxic respiratory failure -Requiring 2 to 2.5 L oxygen currently.  Might need oxygen upon discharge as well -Incentive spirometry -CT of the chest on 05/15/2019 showed small to moderate bilateral pleural effusions along with some groundglass opacities concerning for pulmonary edema versus infiltrate -Wean off oxygen as able.  Will give Lasix 60 mg IV x1.  Strict input and output.  Daily weights.  Unspecified atrial fibrillation -Currently in normal sinus rhythm -Not on any medication for A. fib at home.  Closed right femur fracture -Management as per primary orthopedics team.  Status post IM nailing.  Pain management/wound care/activity as per orthopedics recommendations.  Sacral lytic lesions -Oncology has been consulted.  Follow further oncology recommendations.  L4 compression deformity - new but chronic appearing L4 compression deformity seen on MRI of lumbar spine.  No role for neurosurgical intervention as per neurosurgery.  Outpatient follow-up  Hypothyroidism -Continue Synthroid  Anemia of chronic disease -Status post 3 units transfusion during this hospitalization.  Hemoglobin  stable.   Subjective: Patient seen and examined at bedside.  She is a poor historian.  Denies worsening shortness of breath, fever.  Objective: Vitals:   05/15/19 1931 05/16/19 0601 05/16/19 0737 05/16/19 1340  BP: 99/84 129/67 (!) 129/56 129/67  Pulse: 99 98 98 98  Resp: 16 17 15 18   Temp: 98.2 F (36.8 C) (!) 97.5 F (36.4 C) 98.2 F (36.8 C) 98.2 F (36.8 C)  TempSrc: Oral Oral Oral Oral  SpO2:  97% 100% 98%  Weight:      Height:        Intake/Output Summary (Last 24 hours) at 05/16/2019 1430 Last data filed at 05/16/2019 0900 Gross per 24 hour  Intake 660 ml  Output --  Net 660 ml   Filed Weights   05/11/19 0902  Weight: 54.4 kg    Examination:  General exam: Appears chronically ill.  No distress.   Respiratory system: Bilateral decreased breath sounds at bases with some scattered crackles Cardiovascular system: S1 & S2 heard, Rate controlled Gastrointestinal system: Abdomen is nondistended, soft and nontender. Normal bowel sounds heard. Extremities: No cyanosis, clubbing; trace lower extremity edema   Data Reviewed: I have personally reviewed following labs and imaging studies  CBC: Recent Labs  Lab 05/10/19 1459 05/10/19 1459 05/11/19 0503 05/11/19 2132 05/12/19 1224 05/13/19 0407 05/14/19 0251 05/15/19 0357 05/16/19 0628  WBC 6.2   < > 4.1  --   --  3.6* 4.9 4.7 3.5*  NEUTROABS 4.8  --  2.5  --   --   --   --   --   --   HGB 6.9*   < > 10.4*   < > 7.5* 7.0* 9.5* 9.0* 9.2*  HCT 21.9*   < > 31.8*   < >  23.1* 22.5* 29.3* 28.3* 28.9*  MCV 88.3   < > 86.6  --   --  89.3 89.3 90.4 90.3  PLT 218   < > 154  --   --  140* 166 190 195   < > = values in this interval not displayed.   Basic Metabolic Panel: Recent Labs  Lab 05/11/19 0503 05/12/19 0602 05/13/19 0407 05/14/19 0251 05/15/19 0357  NA 136 136 137 138 138  K 3.8 3.7 3.4* 4.1 3.9  CL 99 101 100 102 98  CO2 29 28 28 28 31   GLUCOSE 104* 93 85 94 100*  BUN 21 18 14 12 12   CREATININE 0.68  0.70 0.60 0.58 0.54  CALCIUM 8.4* 8.0* 7.4* 7.8* 8.2*  MG 1.9 1.7 2.2 1.9 1.8   GFR: Estimated Creatinine Clearance: 46.6 mL/min (by C-G formula based on SCr of 0.54 mg/dL). Liver Function Tests: Recent Labs  Lab 05/11/19 0503 05/12/19 0602 05/13/19 0407 05/14/19 0251 05/15/19 0357  AST 15 14* 16 19 19   ALT 11 9 9 9 9   ALKPHOS 67 47 56 71 78  BILITOT 0.5 0.4 0.5 1.1 0.9  PROT 5.1* 4.3* 4.2* 4.6* 4.7*  ALBUMIN 2.6* 2.3* 2.0* 2.2* 2.1*   No results for input(s): LIPASE, AMYLASE in the last 168 hours. No results for input(s): AMMONIA in the last 168 hours. Coagulation Profile: Recent Labs  Lab 05/10/19 1459 05/11/19 0503  INR 1.0 1.0   Cardiac Enzymes: Recent Labs  Lab 05/10/19 1459  CKTOTAL 109   BNP (last 3 results) No results for input(s): PROBNP in the last 8760 hours. HbA1C: No results for input(s): HGBA1C in the last 72 hours. CBG: No results for input(s): GLUCAP in the last 168 hours. Lipid Profile: No results for input(s): CHOL, HDL, LDLCALC, TRIG, CHOLHDL, LDLDIRECT in the last 72 hours. Thyroid Function Tests: No results for input(s): TSH, T4TOTAL, FREET4, T3FREE, THYROIDAB in the last 72 hours. Anemia Panel: No results for input(s): VITAMINB12, FOLATE, FERRITIN, TIBC, IRON, RETICCTPCT in the last 72 hours. Sepsis Labs: No results for input(s): PROCALCITON, LATICACIDVEN in the last 168 hours.  Recent Results (from the past 240 hour(s))  Respiratory Panel by RT PCR (Flu A&B, Covid) - Nasopharyngeal Swab     Status: None   Collection Time: 05/10/19  3:50 PM   Specimen: Nasopharyngeal Swab  Result Value Ref Range Status   SARS Coronavirus 2 by RT PCR NEGATIVE NEGATIVE Final    Comment: (NOTE) SARS-CoV-2 target nucleic acids are NOT DETECTED. The SARS-CoV-2 RNA is generally detectable in upper respiratoy specimens during the acute phase of infection. The lowest concentration of SARS-CoV-2 viral copies this assay can detect is 131 copies/mL. A negative  result does not preclude SARS-Cov-2 infection and should not be used as the sole basis for treatment or other patient management decisions. A negative result may occur with  improper specimen collection/handling, submission of specimen other than nasopharyngeal swab, presence of viral mutation(s) within the areas targeted by this assay, and inadequate number of viral copies (<131 copies/mL). A negative result must be combined with clinical observations, patient history, and epidemiological information. The expected result is Negative. Fact Sheet for Patients:  PinkCheek.be Fact Sheet for Healthcare Providers:  GravelBags.it This test is not yet ap proved or cleared by the [place] FDA and  has been authorized for detection and/or diagnosis of SARS-CoV-2 by FDA under an Emergency Use Authorization (EUA). This EUA will remain  in effect (meaning this test can be used)  for the duration of the COVID-19 declaration under Section 564(b)(1) of the Act, 21 U.S.C. section 360bbb-3(b)(1), unless the authorization is terminated or revoked sooner.    Influenza A by PCR NEGATIVE NEGATIVE Final   Influenza B by PCR NEGATIVE NEGATIVE Final    Comment: (NOTE) The Xpert Xpress SARS-CoV-2/FLU/RSV assay is intended as an aid in  the diagnosis of influenza from Nasopharyngeal swab specimens and  should not be used as a sole basis for treatment. Nasal washings and  aspirates are unacceptable for Xpert Xpress SARS-CoV-2/FLU/RSV  testing. Fact Sheet for Patients: PinkCheek.be Fact Sheet for Healthcare Providers: GravelBags.it This test is not yet approved or cleared by the [place] FDA and  has been authorized for detection and/or diagnosis of SARS-CoV-2 by  FDA under an Emergency Use Authorization (EUA). This EUA will remain  in effect (meaning this test can be used) for the  duration of the  Covid-19 declaration under Section 564(b)(1) of the Act, 21  U.S.C. section 360bbb-3(b)(1), unless the authorization is  terminated or revoked. Performed at [facility], [place] [address] [place], [place] [address]   MRSA PCR Screening     Status: None   Collection Time: 05/10/19  9:57 PM   Specimen: Nasal Mucosa; Nasopharyngeal  Result Value Ref Range Status   MRSA by PCR NEGATIVE NEGATIVE Final    Comment:        The GeneXpert MRSA Assay (FDA approved for NASAL specimens only), is one component of a comprehensive MRSA colonization surveillance program. It is not intended to diagnose MRSA infection nor to guide or monitor treatment for MRSA infections. Performed at [facility], [place] [address], [place], [place] [address]          Radiology Studies: CT CHEST W CONTRAST  Result Date: 05/15/2019 CLINICAL DATA:  Lytic lesions. And sucks EXAM: CT CHEST, ABDOMEN, AND PELVIS WITH CONTRAST TECHNIQUE: Multidetector CT imaging of the chest, abdomen and pelvis was performed following the standard protocol during bolus administration of intravenous contrast. CONTRAST:  169mL OMNIPAQUE IOHEXOL 300 MG/ML  SOLN COMPARISON:  None. FINDINGS: CT CHEST FINDINGS Cardiovascular: Heart size is relatively normal. Aortic calcifications are noted. There is no evidence for thoracic aortic dissection or aneurysm. There is no large centrally located pulmonary embolism. Detection of smaller pulmonary emboli is limited by contrast bolus timing. Mediastinum/Nodes: --No mediastinal or hilar lymphadenopathy. --No axillary lymphadenopathy. --No supraclavicular lymphadenopathy. --Normal thyroid gland. --The esophagus is unremarkable Lungs/Pleura: There are small to moderate-sized bilateral pleural effusions, right greater than left. Adjacent compressive atelectasis is noted. There are scattered bilateral ground-glass pulmonary opacities, greatest within the right middle lobe. There is no  pneumothorax. There is some mild interlobular septal thickening. There is some bronchial wall thickening and mucus plugging at the lung bases bilaterally. Musculoskeletal: The patient is status post prior vertebral augmentation of the T6, T7, and T12 vertebral bodies. There is an age-indeterminate pathologic compression fracture of the T10 vertebral body. Within the T10 vertebral body there is a soft tissue mass measuring approximately 2.4 x 2.2 cm involving the left lateral aspect of the vertebral body with extension into the left pedicle. There is extension into the epidural space with mild spinal canal narrowing. CT ABDOMEN PELVIS FINDINGS Hepatobiliary: The liver is normal. Status post cholecystectomy.There is mild intrahepatic and extrahepatic biliary ductal dilatation without evidence for an obstructing lesion. Pancreas: Normal contours without ductal dilatation. No peripancreatic fluid collection. Spleen: No splenic laceration or hematoma. Adrenals/Urinary Tract: --Adrenal glands: No adrenal  hemorrhage. --Right kidney/ureter: No hydronephrosis or perinephric hematoma. --Left kidney/ureter: No hydronephrosis or perinephric hematoma. --Urinary bladder: Unremarkable. Stomach/Bowel: --Stomach/Duodenum: No hiatal hernia or other gastric abnormality. Normal duodenal course and caliber. --Small bowel: No dilatation or inflammation. --Colon: Rectosigmoid diverticulosis without acute inflammation. There is a large amount of stool throughout the visualized portions of the colon. --Appendix: Not visualized. No right lower quadrant inflammation or free fluid. Vascular/Lymphatic: Atherosclerotic calcification is present within the non-aneurysmal abdominal aorta, without hemodynamically significant stenosis. --No retroperitoneal lymphadenopathy. --No mesenteric lymphadenopathy. --No pelvic or inguinal lymphadenopathy. Reproductive: Unremarkable Other: There is presacral soft tissue edema without evidence for a clear cause.  Body wall edema is noted. Musculoskeletal. Again noted are bilateral lytic soft tissue lesions involving the bilateral sacrum measuring up to approximately 3.2 cm on the right and 2.1 cm on the left. There is extensive asymmetric soft tissue swelling about the right lower extremity which is likely related to the patient's recent fracture and surgical intervention. There is a chronic appearing compression fracture of the L4 vertebral body. IMPRESSION: 1. Lytic lesions involving the bilateral sacrum and T10 vertebral body, concerning for osseous metastatic disease or myeloma. There is a pathologic compression fracture of the T10 vertebral body. No clear primary lesion was identified in the chest, abdomen, or pelvis. The sacral lesions are amenable to percutaneous biopsy as clinically indicated. 2. Small to moderate bilateral pleural effusions with associated compressive atelectasis. 3. Scattered bilateral ground-glass pulmonary opacities, greatest within the right middle lobe, may be secondary to infection versus pulmonary edema. 4. Large stool burden. Rectosigmoid diverticulosis without evidence for acute inflammation. 5. Soft tissue swelling about the right lower extremity is likely related to the patient's recent fracture and surgical intervention. 6. Again noted is biliary ductal dilatation. Correlation with laboratory studies and nonemergent MRCP is recommended. Aortic Atherosclerosis (ICD10-I70.0). Electronically Signed   By: Constance Holster M.D.   On: 05/15/2019 21:21   MR Lumbar Spine W Wo Contrast  Result Date: 05/14/2019 CLINICAL DATA:  Episodes of lower extremity weakness EXAM: MRI LUMBAR SPINE WITHOUT AND WITH CONTRAST TECHNIQUE: Multiplanar and multiecho pulse sequences of the lumbar spine were obtained without and with intravenous contrast. CONTRAST:  15mL GADAVIST GADOBUTROL 1 MMOL/ML IV SOLN COMPARISON:  MRI 2011, CT 2017 FINDINGS: Segmentation:  Standard. Alignment:  Anteroposterior alignment is  maintained. Vertebrae: There is new loss of height at the superior endplate of L4 of nearly 50%. There is mild endplate retropulsion. However, there is no marrow edema to suggest acuity. Chronic compression deformity of T12 with cement augmentation. Marrow signal is mildly heterogeneous likely related to decreased mineralization. Partially imaged STIR hyperintense lesions of the sacrum better evaluated on concurrent dedicated imaging. Conus medullaris and cauda equina: Conus extends to the L2 level. Conus and cauda equina appear normal. No abnormal intrathecal enhancement. Paraspinal and other soft tissues: Left renal cyst. Posterior paraspinal muscle atrophy. Disc levels: L1-L2:  No canal or foraminal stenosis. L2-L3:  No canal or foraminal stenosis. L3-L4: Disc bulge with endplate osteophytic ridging and facet arthropathy with ligamentum flavum infolding. Moderate canal stenosis with partial effacement of the lateral recesses. Mild right and mild to moderate left foraminal stenosis. L4-L5: Disc bulge with superimposed small central protrusion and facet arthropathy with ligamentum flavum infolding. Mild canal stenosis. Mild foraminal stenosis. L5-S1: Disc bulge with endplate osteophytic ridging facet arthropathy. No canal stenosis. Mild left greater than right foraminal stenosis. IMPRESSION: New but chronic appearing compression deformity of L4. Multilevel degenerative changes with some progression since remote study  of 2011. Stenosis is greatest at L3-L4. Electronically Signed   By: Macy Mis M.D.   On: 05/14/2019 20:12   CT ABDOMEN PELVIS W CONTRAST  Result Date: 05/15/2019 CLINICAL DATA:  Lytic lesions. And sucks EXAM: CT CHEST, ABDOMEN, AND PELVIS WITH CONTRAST TECHNIQUE: Multidetector CT imaging of the chest, abdomen and pelvis was performed following the standard protocol during bolus administration of intravenous contrast. CONTRAST:  176mL OMNIPAQUE IOHEXOL 300 MG/ML  SOLN COMPARISON:  None.  FINDINGS: CT CHEST FINDINGS Cardiovascular: Heart size is relatively normal. Aortic calcifications are noted. There is no evidence for thoracic aortic dissection or aneurysm. There is no large centrally located pulmonary embolism. Detection of smaller pulmonary emboli is limited by contrast bolus timing. Mediastinum/Nodes: --No mediastinal or hilar lymphadenopathy. --No axillary lymphadenopathy. --No supraclavicular lymphadenopathy. --Normal thyroid gland. --The esophagus is unremarkable Lungs/Pleura: There are small to moderate-sized bilateral pleural effusions, right greater than left. Adjacent compressive atelectasis is noted. There are scattered bilateral ground-glass pulmonary opacities, greatest within the right middle lobe. There is no pneumothorax. There is some mild interlobular septal thickening. There is some bronchial wall thickening and mucus plugging at the lung bases bilaterally. Musculoskeletal: The patient is status post prior vertebral augmentation of the T6, T7, and T12 vertebral bodies. There is an age-indeterminate pathologic compression fracture of the T10 vertebral body. Within the T10 vertebral body there is a soft tissue mass measuring approximately 2.4 x 2.2 cm involving the left lateral aspect of the vertebral body with extension into the left pedicle. There is extension into the epidural space with mild spinal canal narrowing. CT ABDOMEN PELVIS FINDINGS Hepatobiliary: The liver is normal. Status post cholecystectomy.There is mild intrahepatic and extrahepatic biliary ductal dilatation without evidence for an obstructing lesion. Pancreas: Normal contours without ductal dilatation. No peripancreatic fluid collection. Spleen: No splenic laceration or hematoma. Adrenals/Urinary Tract: --Adrenal glands: No adrenal hemorrhage. --Right kidney/ureter: No hydronephrosis or perinephric hematoma. --Left kidney/ureter: No hydronephrosis or perinephric hematoma. --Urinary bladder: Unremarkable.  Stomach/Bowel: --Stomach/Duodenum: No hiatal hernia or other gastric abnormality. Normal duodenal course and caliber. --Small bowel: No dilatation or inflammation. --Colon: Rectosigmoid diverticulosis without acute inflammation. There is a large amount of stool throughout the visualized portions of the colon. --Appendix: Not visualized. No right lower quadrant inflammation or free fluid. Vascular/Lymphatic: Atherosclerotic calcification is present within the non-aneurysmal abdominal aorta, without hemodynamically significant stenosis. --No retroperitoneal lymphadenopathy. --No mesenteric lymphadenopathy. --No pelvic or inguinal lymphadenopathy. Reproductive: Unremarkable Other: There is presacral soft tissue edema without evidence for a clear cause. Body wall edema is noted. Musculoskeletal. Again noted are bilateral lytic soft tissue lesions involving the bilateral sacrum measuring up to approximately 3.2 cm on the right and 2.1 cm on the left. There is extensive asymmetric soft tissue swelling about the right lower extremity which is likely related to the patient's recent fracture and surgical intervention. There is a chronic appearing compression fracture of the L4 vertebral body. IMPRESSION: 1. Lytic lesions involving the bilateral sacrum and T10 vertebral body, concerning for osseous metastatic disease or myeloma. There is a pathologic compression fracture of the T10 vertebral body. No clear primary lesion was identified in the chest, abdomen, or pelvis. The sacral lesions are amenable to percutaneous biopsy as clinically indicated. 2. Small to moderate bilateral pleural effusions with associated compressive atelectasis. 3. Scattered bilateral ground-glass pulmonary opacities, greatest within the right middle lobe, may be secondary to infection versus pulmonary edema. 4. Large stool burden. Rectosigmoid diverticulosis without evidence for acute inflammation. 5. Soft tissue swelling about  the right lower  extremity is likely related to the patient's recent fracture and surgical intervention. 6. Again noted is biliary ductal dilatation. Correlation with laboratory studies and nonemergent MRCP is recommended. Aortic Atherosclerosis (ICD10-I70.0). Electronically Signed   By: Constance Holster M.D.   On: 05/15/2019 21:21   MR SACRUM SI JOINTS W WO CONTRAST  Result Date: 05/15/2019 CLINICAL DATA:  Sacral lesions on prior CT examination, for further investigation. EXAM: MRI PELVIS WITHOUT AND WITH CONTRAST TECHNIQUE: Multiplanar multisequence MR imaging of the pelvis was performed both before and after administration of intravenous contrast. CONTRAST:  4mL GADAVIST GADOBUTROL 1 MMOL/ML IV SOLN COMPARISON:  CT pelvis 05/01/2019 FINDINGS: Osseous structures: Expansile likely enhancing 4.3 by 2.8 by 3.3 cm lesion of the right upper sacral ala noted with bulging anterior contour and high precontrast T2 signal. This mildly abuts and displaces the right sacral plexus. Separate but similar left sacral lesion on image 12/15 measures 2.3 by 1.9 by 1.9 cm. A third similar but smaller lesion is present in the right posterior acetabular wall measuring 1.3 by 1.0 by 1.0 cm on image 23/13. Please note that today's exam was focused on the bony sacrum rather than the entire bony pelvis. Musculoskeletal: Normal appearance of the piriformis musculature and visualized iliopsoas musculature. Joints: The larger right sacral lesion abuts the sacral side of the SI joint. No SI joint effusion is identified. Limited assessment of the hip joints demonstrates upper normal amount of fluid. Soft tissues: Lower presacral high T2 and low T1 signal favoring presacral edema, nonspecific. This appears increased compared to the CT examination from 05/01/2019. Sigmoid colon diverticulosis. IMPRESSION: 1. Expansile lesions of the right upper sacral ala, left upper sacrum, and right posterior acetabular wall. The larger right sacral lesion abuts and  displaces the right sacral plexus. Top differential diagnostic considerations include myeloma and metastatic disease. 2. No other pelvic masses are identified. 3. Nonspecific presacral edema, increased compared to 05/01/2019. 4. Sigmoid colon diverticulosis. Electronically Signed   By: Van Clines M.D.   On: 05/15/2019 08:10        Scheduled Meds: . sodium chloride   Intravenous Once  . acetaminophen  1,000 mg Oral Q6H  . ALPRAZolam  0.5 mg Oral QHS  . Chlorhexidine Gluconate Cloth  6 each Topical Daily  . cholecalciferol  2,000 Units Oral BID  . enoxaparin (LOVENOX) injection  40 mg Subcutaneous Q24H  . levothyroxine  100 mcg Oral Q0600  . polyvinyl alcohol  1 drop Both Eyes QID   Continuous Infusions: . sodium chloride 125 mL/hr at 05/15/19 0931  . methocarbamol (ROBAXIN) IV            Aline August, MD [facility] 05/16/2019, 2:30 PM

## 2019-05-16 NOTE — Progress Notes (Signed)
Orthopaedic Trauma Progress Note  S: Doing okay today, states her pain is well controlled. Appetite has been poor, states she doesn't like the food but has been trying to drink fluids. Last BM was this morning.  Wants to go home. Has undergone several imaging studies over the past few days. IR, oncology, neurosurgery involved.  Is scheduled for a bone scan today. Has no specific questions currently   O:  Vitals:   05/16/19 0601 05/16/19 0737  BP: 129/67 (!) 129/56  Pulse: 98 98  Resp: 17 15  Temp: (!) 97.5 F (36.4 C) 98.2 F (36.8 C)  SpO2: 97% 100%    General - Sitting up in bedside chair, NAD Respiratory - No increased work of breathing.  Right Lower Extremity - Incisions clean, dry, intact.  Steri-Strips in place but starting to peel off.  Moderate ecchymosis through the hip and thigh. Moderate swelling throughout extremity. No significant tenderness with palpation throughout extremity.  Ankle dorsiflexion/plantarflexion is intact.  Able to wiggle each of her toes. Sensation intact to light touch distally. Compartments are soft and compressible. Foot is cool but equal to contralateral side. +DP pulse  Imaging: Stable post op femur imaging.   Labs:  Results for orders placed or performed during the hospital encounter of 05/10/19 (from the past 24 hour(s))  CBC     Status: Abnormal   Collection Time: 05/16/19  6:28 AM  Result Value Ref Range   WBC 3.5 (L) 4.0 - 10.5 K/uL   RBC 3.20 (L) 3.87 - 5.11 MIL/uL   Hemoglobin 9.2 (L) 12.0 - 15.0 g/dL   HCT 28.9 (L) 36.0 - 46.0 %   MCV 90.3 80.0 - 100.0 fL   MCH 28.8 26.0 - 34.0 pg   MCHC 31.8 30.0 - 36.0 g/dL   RDW 14.9 11.5 - 15.5 %   Platelets 195 150 - 400 K/uL   nRBC 0.0 0.0 - 0.2 %    Assessment: 79 year old female status post fall, 5 Days Post-Op   Injuries: Right femoral shaft fracture status post retrograde IM nail  Weightbearing: WBAT RLE  Insicional and dressing care: Incisions can be left open to air  Showering: Okay  to begin showering and getting incisions wet.  Orthopedic device(s): None   CV/Blood loss: Acute blood loss anemia, Hgb 9.2 this morning. Hemodynamically stable. Received 1 unit PRBCs 05/13/2019.   Pain management:  1. Tylenol 1000 mg q 6 hours scheduled 2. Robaxin 500 mg q 6 hours PRN 3. Oxycodone 5-10 mg q 4 hours PRN 4. Morphine 1-2 mg q 2 hours PRN  VTE prophylaxis:  Lovenox SCDs: Ordered, personally re-applied to patient this morning  ID:  Ancef 2gm post op completed  Foley/Lines: No foley.  KVO IVFs  Medical co-morbidities: atrial fibrillation, compression fracture T12 s/p kyphoplasty, HTN, Anemia, Hypothyroidism, Anxiety  Impediments to Fracture Healing: Vitamin D level 22, continue D3 supplementation  Dispo: Up with therapies as tolerated. PT/OT recommending SNF. Discharge once cleared by neurosurgery, oncology, medicine teams.   Follow - up plan: 2 weeks for wound check and repeat x-rays  Contact information:  Katha Hamming MD, Patrecia Pace PA-C   [facility] [phone] (office) [email or web address]

## 2019-05-16 NOTE — Progress Notes (Signed)
OT Cancellation Note  Patient Details Name: Courtney Arnold MRN: [id number] DOB: 05/10/1940   Cancelled Treatment:    Reason Eval/Treat Not Completed: Patient at procedure or test/ unavailable Pt currently with transport team to go down to nuclear medicine. Will follow up as time allows and pt is appropriate.   [facility] OTR/L Acute Rehabilitation Services Office: [place] 05/16/2019, 2:55 PM

## 2019-05-17 ENCOUNTER — Inpatient Hospital Stay (HOSPITAL_COMMUNITY): Payer: Medicare Other

## 2019-05-17 ENCOUNTER — Other Ambulatory Visit: Payer: Self-pay

## 2019-05-17 ENCOUNTER — Ambulatory Visit (HOSPITAL_COMMUNITY): Payer: Medicare Other | Admitting: Hematology

## 2019-05-17 LAB — CBC
HCT: 27.3 % — ABNORMAL LOW (ref 36.0–46.0)
Hemoglobin: 8.8 g/dL — ABNORMAL LOW (ref 12.0–15.0)
MCH: 28.5 pg (ref 26.0–34.0)
MCHC: 32.2 g/dL (ref 30.0–36.0)
MCV: 88.3 fL (ref 80.0–100.0)
Platelets: 227 10*3/uL (ref 150–400)
RBC: 3.09 MIL/uL — ABNORMAL LOW (ref 3.87–5.11)
RDW: 15 % (ref 11.5–15.5)
WBC: 3.7 10*3/uL — ABNORMAL LOW (ref 4.0–10.5)
nRBC: 0 % (ref 0.0–0.2)

## 2019-05-17 LAB — BPAM RBC
Blood Product Expiration Date: 202103132359
Unit Type and Rh: 9500

## 2019-05-17 LAB — BASIC METABOLIC PANEL
Anion gap: 14 (ref 5–15)
BUN: 10 mg/dL (ref 8–23)
CO2: 29 mmol/L (ref 22–32)
Calcium: 8.2 mg/dL — ABNORMAL LOW (ref 8.9–10.3)
Chloride: 91 mmol/L — ABNORMAL LOW (ref 98–111)
Creatinine, Ser: 0.52 mg/dL (ref 0.44–1.00)
GFR calc Af Amer: >60 mL/min (ref >60)
GFR calc non Af Amer: >60 mL/min (ref >60)
Glucose, Bld: 91 mg/dL (ref 70–99)
Potassium: 2.9 mmol/L — ABNORMAL LOW (ref 3.5–5.1)
Sodium: 134 mmol/L — ABNORMAL LOW (ref 135–145)

## 2019-05-17 LAB — TYPE AND SCREEN
ABO/RH(D): O POS
Antibody Screen: NEGATIVE
Unit division: 0

## 2019-05-17 LAB — MAGNESIUM: Magnesium: 1.6 mg/dL — ABNORMAL LOW (ref 1.7–2.4)

## 2019-05-17 LAB — SARS CORONAVIRUS 2 (TAT 6-24 HRS): SARS Coronavirus 2: NEGATIVE

## 2019-05-17 MED ORDER — MIDAZOLAM HCL 2 MG/2ML IJ SOLN
INTRAMUSCULAR | Status: AC
  Filled 2019-05-17: qty 2

## 2019-05-17 MED ORDER — FENTANYL CITRATE (PF) 100 MCG/2ML IJ SOLN
INTRAMUSCULAR | Status: AC | PRN
  Administered 2019-05-17: 25 ug via INTRAVENOUS

## 2019-05-17 MED ORDER — MIDAZOLAM HCL 2 MG/2ML IJ SOLN
INTRAMUSCULAR | Status: AC | PRN
  Administered 2019-05-17: 0.5 mg via INTRAVENOUS

## 2019-05-17 MED ORDER — POTASSIUM CHLORIDE CRYS ER 20 MEQ PO TBCR
40.0000 meq | EXTENDED_RELEASE_TABLET | ORAL | Status: AC
  Administered 2019-05-17 (×2): 40 meq via ORAL
  Filled 2019-05-17 (×2): qty 2

## 2019-05-17 MED ORDER — FENTANYL CITRATE (PF) 100 MCG/2ML IJ SOLN
INTRAMUSCULAR | Status: AC
  Filled 2019-05-17: qty 2

## 2019-05-17 MED ORDER — MAGNESIUM SULFATE 2 GM/50ML IV SOLN
2.0000 g | Freq: Once | INTRAVENOUS | Status: AC
  Administered 2019-05-17: 2 g via INTRAVENOUS
  Filled 2019-05-17: qty 50

## 2019-05-17 MED ORDER — FUROSEMIDE 10 MG/ML IJ SOLN
60.0000 mg | Freq: Once | INTRAMUSCULAR | Status: AC
  Administered 2019-05-17: 60 mg via INTRAVENOUS
  Filled 2019-05-17: qty 6

## 2019-05-17 NOTE — Consult Note (Signed)
Chief Complaint: Patient was seen in consultation today for sacral lesion biopsy.  Referring Physician(s): Truitt Merle  Supervising Physician: Sandi Mariscal  Patient Status: [facility] - In-pt  History of Present Illness: Courtney Arnold is a 79 y.o. female with a past medical history significant HTN, a.fib not on anticoagulation and previous T12 compression fracture s/p KP who presented to [facility] ED on 05/10/19 after a fall at home. She was found to have a right femoral shaft fracture and underwent retrograde intramedullary nailing on 05/11/19 with Dr. Doreatha Martin. She has known lytic bone lesions and was planned to undergo outpatient work up with oncology including PET scan. She underwent MRI of the lumbar spine and sacrum on 05/15/19 which noted expansile lesions of the right upper sacral ala, left upper sacrum and right posterior acetabular wall concerning for metastatic disease vs myeloma. IR has been consulted for biopsy of the sacral lesions.  Courtney Arnold states she feels fine, has to use the bathroom. She is anxious to undergo the biopsy to determine what is going on and is wondering when the procedure will occur. She states understanding of the requested procedure and wishes to proceed.  Past Medical History:  Diagnosis Date  . Atrial fibrillation ([place]) 2011   Postop, spontaneous conversion to normal sinus after one hour  . Compression fracture 07/24/09   T12; kyphoplasty  . History of echocardiogram 5/11   EF 65%  . Hypertension   . Thyroid disease   . Tobacco abuse     Past Surgical History:  Procedure Laterality Date  . BACK SURGERY    . BACK SURGERY  06/06/2015  . BREAST EXCISIONAL BIOPSY Left    50 years ago  benign  . CHOLECYSTECTOMY N/A 04/25/2015   Procedure: LAPAROSCOPIC CHOLECYSTECTOMY WITH INTRAOPERATIVE CHOLANGIOGRAM;  Surgeon: Mickeal Skinner, MD;  Location: [facility] ORS;  Service: General;  Laterality: N/A;  . COLONOSCOPY N/A 11/26/2015   Procedure: COLONOSCOPY;  Surgeon: Daneil Dolin, MD;  Location: [facility] SUITE;  Service: Endoscopy;  Laterality: N/A;  7:30 am  . ERCP N/A 04/14/2015   Procedure: ENDOSCOPIC RETROGRADE CHOLANGIOPANCREATOGRAPHY (ERCP) Biliary Sphincterotomy, 10x7 stent placement Dilated bilary system just not well seen;  Surgeon: Rogene Houston, MD;  Location: [facility];  Service: Endoscopy;  Laterality: N/A;  . ERCP N/A 06/12/2015   Procedure: ENDOSCOPIC RETROGRADE CHOLANGIOPANCREATOGRAPHY (ERCP);  Surgeon: Rogene Houston, MD;  Location: [facility];  Service: Endoscopy;  Laterality: N/A;  . ESOPHAGOGASTRODUODENOSCOPY N/A 06/12/2015   Procedure: DIAGNOSTIC ESOPHAGOGASTRODUODENOSCOPY (EGD);  Surgeon: Rogene Houston, MD;  Location: [facility];  Service: Endoscopy;  Laterality: N/A;  . FEMUR IM NAIL Right 05/11/2019   Procedure: RETROGRADE INTRAMEDULLARY NAIL FEMORAL;  Surgeon: Shona Needles, MD;  Location: [facility];  Service: Orthopedics;  Laterality: Right;  . STENT REMOVAL  06/12/2015   Procedure: STENT REMOVAL ;  Surgeon: Rogene Houston, MD;  Location: [facility];  Service: Endoscopy;;    Allergies: Patient has no known allergies.  Medications: Prior to Admission medications   Medication Sig Start Date End Date Taking? Authorizing Provider  ALPRAZolam Duanne Moron) 0.5 MG tablet Take 0.5 mg by mouth at bedtime.  05/03/19  Yes [provider]  carboxymethylcellulose (REFRESH PLUS) 0.5 % SOLN Place 1 drop into both eyes in the morning, at noon, in the evening, and at bedtime.   Yes [provider]  cyclobenzaprine (FLEXERIL) 5 MG tablet Take 5 mg by mouth 3 (three) times daily as needed for muscle  spasms.  05/03/19  Yes [provider]  levothyroxine (SYNTHROID) 100 MCG tablet Take 1 tablet (100 mcg total) by mouth daily. 02/19/19  Yes Renato Shin, MD  lidocaine (LIDODERM) 5 % Place 1 patch onto the skin daily. Remove & Discard patch within 12 hours or as directed by MD 05/08/19  Yes Derek Jack, MD  Oxycodone HCl 10 MG TABS  Take 1 tablet (10 mg total) by mouth every 6 (six) hours as needed. Patient taking differently: Take 10 mg by mouth every 6 (six) hours.  05/08/19  Yes Derek Jack, MD  Cholecalciferol (VITAMIN D) 125 MCG (5000 UT) CAPS Take 5,000 Units by mouth daily. 05/14/19   Delray Alt, PA-C  enoxaparin (LOVENOX) 40 MG/0.4ML injection Inject 0.4 mLs (40 mg total) into the skin daily. 05/14/19 06/13/19  Delray Alt, PA-C  HYDROcodone-acetaminophen (NORCO) 5-325 MG tablet Take 1 tablet by mouth every 6 (six) hours as needed for moderate pain. Patient not taking: Reported on [id number]   Delora Fuel, MD  naproxen (NAPROSYN) 500 MG tablet Take 1 tablet (500 mg total) by mouth 2 (two) times daily with a meal. Patient not taking: Reported on 05/08/2019 05/03/19   Triplett, [name], PA-C  ondansetron (ZOFRAN) 4 MG tablet Take 1 tablet (4 mg total) by mouth every 6 (six) hours. As needed for nausea or vomiting Patient not taking: Reported on 05/08/2019 05/03/19   Triplett, [name], PA-C  oxyCODONE (OXY IR/ROXICODONE) 5 MG immediate release tablet Take 1 tablet (5 mg total) by mouth every 6 (six) hours as needed for severe pain. 05/14/19   Delray Alt, PA-C     Family History  Problem Relation Age of Onset  . Heart attack Father   . Stroke Father   . Breast cancer Sister   . Thyroid disease Neg Hx   . Colon cancer Neg Hx     Social History   Socioeconomic History  . Marital status: Widowed    Spouse name: Not on file  . Number of children: 2  . Years of education: Not on file  . Highest education level: Not on file  Occupational History  . Not on file  Tobacco Use  . Smoking status: Former Smoker    Packs/day: 0.15    Years: 20.00    Pack years: 3.00    Quit date: 03/08/2013    Years since quitting: 6.1  . Smokeless tobacco: Never Used  Substance and Sexual Activity  . Alcohol use: No    Alcohol/week: 0.0 standard drinks  . Drug use: No  . Sexual activity: Not Currently  Other Topics  Concern  . Not on file  Social History Narrative   Active in gardens and does yard work.   Social Determinants of Health   Financial Resource Strain: Low Risk   . Difficulty of Paying Living Expenses: Not hard at all  Food Insecurity: No Food Insecurity  . Worried About Charity fundraiser in the Last Year: Never true  . Ran Out of Food in the Last Year: Never true  Transportation Needs: No Transportation Needs  . Lack of Transportation (Medical): No  . Lack of Transportation (Non-Medical): No  Physical Activity: Inactive  . Days of Exercise per Week: 0 days  . Minutes of Exercise per Session: 0 min  Stress: No Stress Concern Present  . Feeling of Stress : Not at all  Social Connections:   . Frequency of Communication with Friends and Family:   . Frequency of  Social Gatherings with Friends and Family:   . Attends Religious Services:   . Active Member of Clubs or Organizations:   . Attends Archivist Meetings:   Marland Kitchen Marital Status:      Review of Systems: A 12 point ROS discussed and pertinent positives are indicated in the HPI above.  All other systems are negative.  Review of Systems  Constitutional: Negative for chills and fever.  Respiratory: Negative for cough and shortness of breath.   Cardiovascular: Negative for chest pain.  Gastrointestinal: Negative for abdominal pain, nausea and vomiting.  Musculoskeletal: Positive for arthralgias and back pain.  Neurological: Negative for dizziness and headaches.    Vital Signs: BP 123/60 (BP Location: Right Arm)   Pulse 99   Temp 98.2 F (36.8 C) (Oral)   Resp 18   Ht 5\' 2"  (1.575 m)   Wt 120 lb (54.4 kg)   SpO2 98%   BMI 21.95 kg/m   Physical Exam Vitals and nursing note reviewed.  Constitutional:      General: She is not in acute distress. HENT:     Head: Normocephalic.     Mouth/Throat:     Mouth: Mucous membranes are moist.     Pharynx: Oropharynx is clear. No oropharyngeal exudate or posterior  oropharyngeal erythema.  Cardiovascular:     Rate and Rhythm: Normal rate and regular rhythm.  Pulmonary:     Effort: Pulmonary effort is normal.     Breath sounds: Normal breath sounds.  Abdominal:     General: There is no distension.     Palpations: Abdomen is soft.     Tenderness: There is no abdominal tenderness.  Skin:    General: Skin is warm and dry.  Neurological:     Mental Status: She is alert and oriented to person, place, and time.  Psychiatric:        Mood and Affect: Mood normal.        Behavior: Behavior normal.        Thought Content: Thought content normal.        Judgment: Judgment normal.      MD Evaluation Airway: WNL Heart: WNL Abdomen: WNL Chest/ Lungs: WNL ASA  Classification: 3 Mallampati/Airway Score: Two   Imaging: DG Chest 1 View  Result Date: 05/10/2019 CLINICAL DATA:  Trauma secondary to a fall in the shower this morning. EXAM: CHEST  1 VIEW COMPARISON:  06/11/2015 FINDINGS: Heart size and pulmonary vascularity are normal. Lungs are clear. No effusions. Multiple old thoracic compression fractures some of which have been treated with vertebroplasty. There is a new compression fracture of T10 since 06/11/2015. IMPRESSION: New compression fracture of T10 since 06/11/2015. No acute cardiopulmonary abnormalities. Electronically Signed   By: Lorriane Shire M.D.   On: 05/10/2019 14:50   DG Knee 1-2 Views Right  Result Date: 05/10/2019 CLINICAL DATA:  Fall EXAM: RIGHT KNEE - 1-2 VIEW COMPARISON:  Concurrent femur radiograph. FINDINGS: Displaced distal femoral diaphysis fracture better assessed on dedicated femur radiographs performed concurrently. Mild tricompartmental degenerative changes of the knee. No other acute fracture or traumatic malalignment is seen. Small effusion is present. IMPRESSION: Displaced distal femoral fracture better assessed on dedicated femur radiographs performed concurrently. Trace knee effusion.  No additional fractures.  Electronically Signed   By: Lovena Le M.D.   On: 05/10/2019 15:43   CT CHEST W CONTRAST  Result Date: 05/15/2019 CLINICAL DATA:  Lytic lesions. And sucks EXAM: CT CHEST, ABDOMEN, AND PELVIS WITH CONTRAST TECHNIQUE: Multidetector  CT imaging of the chest, abdomen and pelvis was performed following the standard protocol during bolus administration of intravenous contrast. CONTRAST:  16mL OMNIPAQUE IOHEXOL 300 MG/ML  SOLN COMPARISON:  None. FINDINGS: CT CHEST FINDINGS Cardiovascular: Heart size is relatively normal. Aortic calcifications are noted. There is no evidence for thoracic aortic dissection or aneurysm. There is no large centrally located pulmonary embolism. Detection of smaller pulmonary emboli is limited by contrast bolus timing. Mediastinum/Nodes: --No mediastinal or hilar lymphadenopathy. --No axillary lymphadenopathy. --No supraclavicular lymphadenopathy. --Normal thyroid gland. --The esophagus is unremarkable Lungs/Pleura: There are small to moderate-sized bilateral pleural effusions, right greater than left. Adjacent compressive atelectasis is noted. There are scattered bilateral ground-glass pulmonary opacities, greatest within the right middle lobe. There is no pneumothorax. There is some mild interlobular septal thickening. There is some bronchial wall thickening and mucus plugging at the lung bases bilaterally. Musculoskeletal: The patient is status post prior vertebral augmentation of the T6, T7, and T12 vertebral bodies. There is an age-indeterminate pathologic compression fracture of the T10 vertebral body. Within the T10 vertebral body there is a soft tissue mass measuring approximately 2.4 x 2.2 cm involving the left lateral aspect of the vertebral body with extension into the left pedicle. There is extension into the epidural space with mild spinal canal narrowing. CT ABDOMEN PELVIS FINDINGS Hepatobiliary: The liver is normal. Status post cholecystectomy.There is mild intrahepatic and  extrahepatic biliary ductal dilatation without evidence for an obstructing lesion. Pancreas: Normal contours without ductal dilatation. No peripancreatic fluid collection. Spleen: No splenic laceration or hematoma. Adrenals/Urinary Tract: --Adrenal glands: No adrenal hemorrhage. --Right kidney/ureter: No hydronephrosis or perinephric hematoma. --Left kidney/ureter: No hydronephrosis or perinephric hematoma. --Urinary bladder: Unremarkable. Stomach/Bowel: --Stomach/Duodenum: No hiatal hernia or other gastric abnormality. Normal duodenal course and caliber. --Small bowel: No dilatation or inflammation. --Colon: Rectosigmoid diverticulosis without acute inflammation. There is a large amount of stool throughout the visualized portions of the colon. --Appendix: Not visualized. No right lower quadrant inflammation or free fluid. Vascular/Lymphatic: Atherosclerotic calcification is present within the non-aneurysmal abdominal aorta, without hemodynamically significant stenosis. --No retroperitoneal lymphadenopathy. --No mesenteric lymphadenopathy. --No pelvic or inguinal lymphadenopathy. Reproductive: Unremarkable Other: There is presacral soft tissue edema without evidence for a clear cause. Body wall edema is noted. Musculoskeletal. Again noted are bilateral lytic soft tissue lesions involving the bilateral sacrum measuring up to approximately 3.2 cm on the right and 2.1 cm on the left. There is extensive asymmetric soft tissue swelling about the right lower extremity which is likely related to the patient's recent fracture and surgical intervention. There is a chronic appearing compression fracture of the L4 vertebral body. IMPRESSION: 1. Lytic lesions involving the bilateral sacrum and T10 vertebral body, concerning for osseous metastatic disease or myeloma. There is a pathologic compression fracture of the T10 vertebral body. No clear primary lesion was identified in the chest, abdomen, or pelvis. The sacral lesions  are amenable to percutaneous biopsy as clinically indicated. 2. Small to moderate bilateral pleural effusions with associated compressive atelectasis. 3. Scattered bilateral ground-glass pulmonary opacities, greatest within the right middle lobe, may be secondary to infection versus pulmonary edema. 4. Large stool burden. Rectosigmoid diverticulosis without evidence for acute inflammation. 5. Soft tissue swelling about the right lower extremity is likely related to the patient's recent fracture and surgical intervention. 6. Again noted is biliary ductal dilatation. Correlation with laboratory studies and nonemergent MRCP is recommended. Aortic Atherosclerosis (ICD10-I70.0). Electronically Signed   By: Constance Holster M.D.   On: 05/15/2019 21:21  MR Lumbar Spine W Wo Contrast  Result Date: 05/14/2019 CLINICAL DATA:  Episodes of lower extremity weakness EXAM: MRI LUMBAR SPINE WITHOUT AND WITH CONTRAST TECHNIQUE: Multiplanar and multiecho pulse sequences of the lumbar spine were obtained without and with intravenous contrast. CONTRAST:  32mL GADAVIST GADOBUTROL 1 MMOL/ML IV SOLN COMPARISON:  MRI 2011, CT 2017 FINDINGS: Segmentation:  Standard. Alignment:  Anteroposterior alignment is maintained. Vertebrae: There is new loss of height at the superior endplate of L4 of nearly 50%. There is mild endplate retropulsion. However, there is no marrow edema to suggest acuity. Chronic compression deformity of T12 with cement augmentation. Marrow signal is mildly heterogeneous likely related to decreased mineralization. Partially imaged STIR hyperintense lesions of the sacrum better evaluated on concurrent dedicated imaging. Conus medullaris and cauda equina: Conus extends to the L2 level. Conus and cauda equina appear normal. No abnormal intrathecal enhancement. Paraspinal and other soft tissues: Left renal cyst. Posterior paraspinal muscle atrophy. Disc levels: L1-L2:  No canal or foraminal stenosis. L2-L3:  No canal or  foraminal stenosis. L3-L4: Disc bulge with endplate osteophytic ridging and facet arthropathy with ligamentum flavum infolding. Moderate canal stenosis with partial effacement of the lateral recesses. Mild right and mild to moderate left foraminal stenosis. L4-L5: Disc bulge with superimposed small central protrusion and facet arthropathy with ligamentum flavum infolding. Mild canal stenosis. Mild foraminal stenosis. L5-S1: Disc bulge with endplate osteophytic ridging facet arthropathy. No canal stenosis. Mild left greater than right foraminal stenosis. IMPRESSION: New but chronic appearing compression deformity of L4. Multilevel degenerative changes with some progression since remote study of 2011. Stenosis is greatest at L3-L4. Electronically Signed   By: Macy Mis M.D.   On: 05/14/2019 20:12   NM Bone Scan Whole Body  Result Date: 05/16/2019 CLINICAL DATA:  Lytic bone lesions.  Initial staging. EXAM: NUCLEAR MEDICINE WHOLE BODY BONE SCAN TECHNIQUE: Whole body anterior and posterior images were obtained approximately 3 hours after intravenous injection of radiopharmaceutical. RADIOPHARMACEUTICALS:  21.9 mCi Technetium-50m MDP IV COMPARISON:  CT chest, abdomen and pelvis of 05/15/2019 and femoral x-rays from 05/11/2019 FINDINGS: Signs of uptake in the right distal femoral shaft along the site of previous at identified fracture transfixed by a femoral rod. Intense radiotracer activity noted over the left proximal tibia and at the T10 level in the spine. Signs of patchy calvarial uptake. Normal urinary tract activity. Urinary bladder is cures much of the sacrum. Relative photopenia in the distal left femur. Just above the knee. Also with relative photopenia along the distal tibia on the left. Photopenia particularly about the right proximal humerus is suggested. IMPRESSION: 1. Areas of increased uptake at site of right femoral fracture, along the right femur following surgery and the in the T10 vertebral  body, all areas of postprocedural change following fracture. 2. Areas of relative photopenia in the left femur, left tibia and right proximal humerus, consider large lytic foci in these locations. 3. Increased radiotracer uptake in the left tibia proximally away from the joint may also represent another site of involvement. There are other questionable areas of photopenia scattered throughout the visualized skeleton. Consider skeletal survey for further assessment as lytic lesions are often photopenic and therefore less visible on bone scan. This will include calvarial assessment which would be helpful given areas of variable calvarial uptake. 4. There is some subtle uptake along the flanks bilaterally that may reflect changes of anasarca. Electronically Signed   By: Zetta Bills M.D.   On: 05/16/2019 17:46   CT ABDOMEN  PELVIS W CONTRAST  Result Date: 05/15/2019 CLINICAL DATA:  Lytic lesions. And sucks EXAM: CT CHEST, ABDOMEN, AND PELVIS WITH CONTRAST TECHNIQUE: Multidetector CT imaging of the chest, abdomen and pelvis was performed following the standard protocol during bolus administration of intravenous contrast. CONTRAST:  164mL OMNIPAQUE IOHEXOL 300 MG/ML  SOLN COMPARISON:  None. FINDINGS: CT CHEST FINDINGS Cardiovascular: Heart size is relatively normal. Aortic calcifications are noted. There is no evidence for thoracic aortic dissection or aneurysm. There is no large centrally located pulmonary embolism. Detection of smaller pulmonary emboli is limited by contrast bolus timing. Mediastinum/Nodes: --No mediastinal or hilar lymphadenopathy. --No axillary lymphadenopathy. --No supraclavicular lymphadenopathy. --Normal thyroid gland. --The esophagus is unremarkable Lungs/Pleura: There are small to moderate-sized bilateral pleural effusions, right greater than left. Adjacent compressive atelectasis is noted. There are scattered bilateral ground-glass pulmonary opacities, greatest within the right middle lobe.  There is no pneumothorax. There is some mild interlobular septal thickening. There is some bronchial wall thickening and mucus plugging at the lung bases bilaterally. Musculoskeletal: The patient is status post prior vertebral augmentation of the T6, T7, and T12 vertebral bodies. There is an age-indeterminate pathologic compression fracture of the T10 vertebral body. Within the T10 vertebral body there is a soft tissue mass measuring approximately 2.4 x 2.2 cm involving the left lateral aspect of the vertebral body with extension into the left pedicle. There is extension into the epidural space with mild spinal canal narrowing. CT ABDOMEN PELVIS FINDINGS Hepatobiliary: The liver is normal. Status post cholecystectomy.There is mild intrahepatic and extrahepatic biliary ductal dilatation without evidence for an obstructing lesion. Pancreas: Normal contours without ductal dilatation. No peripancreatic fluid collection. Spleen: No splenic laceration or hematoma. Adrenals/Urinary Tract: --Adrenal glands: No adrenal hemorrhage. --Right kidney/ureter: No hydronephrosis or perinephric hematoma. --Left kidney/ureter: No hydronephrosis or perinephric hematoma. --Urinary bladder: Unremarkable. Stomach/Bowel: --Stomach/Duodenum: No hiatal hernia or other gastric abnormality. Normal duodenal course and caliber. --Small bowel: No dilatation or inflammation. --Colon: Rectosigmoid diverticulosis without acute inflammation. There is a large amount of stool throughout the visualized portions of the colon. --Appendix: Not visualized. No right lower quadrant inflammation or free fluid. Vascular/Lymphatic: Atherosclerotic calcification is present within the non-aneurysmal abdominal aorta, without hemodynamically significant stenosis. --No retroperitoneal lymphadenopathy. --No mesenteric lymphadenopathy. --No pelvic or inguinal lymphadenopathy. Reproductive: Unremarkable Other: There is presacral soft tissue edema without evidence for a  clear cause. Body wall edema is noted. Musculoskeletal. Again noted are bilateral lytic soft tissue lesions involving the bilateral sacrum measuring up to approximately 3.2 cm on the right and 2.1 cm on the left. There is extensive asymmetric soft tissue swelling about the right lower extremity which is likely related to the patient's recent fracture and surgical intervention. There is a chronic appearing compression fracture of the L4 vertebral body. IMPRESSION: 1. Lytic lesions involving the bilateral sacrum and T10 vertebral body, concerning for osseous metastatic disease or myeloma. There is a pathologic compression fracture of the T10 vertebral body. No clear primary lesion was identified in the chest, abdomen, or pelvis. The sacral lesions are amenable to percutaneous biopsy as clinically indicated. 2. Small to moderate bilateral pleural effusions with associated compressive atelectasis. 3. Scattered bilateral ground-glass pulmonary opacities, greatest within the right middle lobe, may be secondary to infection versus pulmonary edema. 4. Large stool burden. Rectosigmoid diverticulosis without evidence for acute inflammation. 5. Soft tissue swelling about the right lower extremity is likely related to the patient's recent fracture and surgical intervention. 6. Again noted is biliary ductal dilatation. Correlation with laboratory  studies and nonemergent MRCP is recommended. Aortic Atherosclerosis (ICD10-I70.0). Electronically Signed   By: Constance Holster M.D.   On: 05/15/2019 21:21   MR SACRUM SI JOINTS W WO CONTRAST  Result Date: 05/15/2019 CLINICAL DATA:  Sacral lesions on prior CT examination, for further investigation. EXAM: MRI PELVIS WITHOUT AND WITH CONTRAST TECHNIQUE: Multiplanar multisequence MR imaging of the pelvis was performed both before and after administration of intravenous contrast. CONTRAST:  27mL GADAVIST GADOBUTROL 1 MMOL/ML IV SOLN COMPARISON:  CT pelvis 05/01/2019 FINDINGS: Osseous  structures: Expansile likely enhancing 4.3 by 2.8 by 3.3 cm lesion of the right upper sacral ala noted with bulging anterior contour and high precontrast T2 signal. This mildly abuts and displaces the right sacral plexus. Separate but similar left sacral lesion on image 12/15 measures 2.3 by 1.9 by 1.9 cm. A third similar but smaller lesion is present in the right posterior acetabular wall measuring 1.3 by 1.0 by 1.0 cm on image 23/13. Please note that today's exam was focused on the bony sacrum rather than the entire bony pelvis. Musculoskeletal: Normal appearance of the piriformis musculature and visualized iliopsoas musculature. Joints: The larger right sacral lesion abuts the sacral side of the SI joint. No SI joint effusion is identified. Limited assessment of the hip joints demonstrates upper normal amount of fluid. Soft tissues: Lower presacral high T2 and low T1 signal favoring presacral edema, nonspecific. This appears increased compared to the CT examination from 05/01/2019. Sigmoid colon diverticulosis. IMPRESSION: 1. Expansile lesions of the right upper sacral ala, left upper sacrum, and right posterior acetabular wall. The larger right sacral lesion abuts and displaces the right sacral plexus. Top differential diagnostic considerations include myeloma and metastatic disease. 2. No other pelvic masses are identified. 3. Nonspecific presacral edema, increased compared to 05/01/2019. 4. Sigmoid colon diverticulosis. Electronically Signed   By: Van Clines M.D.   On: 05/15/2019 08:10   CT FEMUR RIGHT WO CONTRAST  Result Date: 05/10/2019 CLINICAL DATA:  79 year old female with trauma to the right lower extremity. EXAM: CT OF THE LOWER RIGHT EXTREMITY WITHOUT CONTRAST TECHNIQUE: Multidetector CT imaging of the right lower extremity was performed according to the standard protocol. COMPARISON:  Right lower extremity radiograph dated 05/10/2019. FINDINGS: Bones/Joint/Cartilage There is a displaced  spiral fracture of the distal third of the right femoral diaphysis. There is external rotation and valgus angulation of the distal fracture fragment. The bones are osteopenic. There is no dislocation. There is a small suprapatellar effusion. Partially visualized lucent area in the lateral aspect of the right sacral bone adjacent to the sacroiliac joint (series 3, image 1) noted which is not characterized and appears new since the study of 06/11/2015. This may be represent degenerative changes and osteopenia although a lytic lesion is not excluded. Ligaments Suboptimally assessed by CT. Muscles and Tendons No intramuscular hematoma or large fluid collection. Soft tissues Sigmoid diverticulosis. There is stranding and edema in the deep fascial of the right thigh as well as edema of the subcutaneous soft tissues of the distal thigh and knee. IMPRESSION: 1. Displaced spiral fracture of the distal third of the right femoral diaphysis with external rotation and valgus angulation of the distal fracture fragment. No dislocation. 2. Small suprapatellar effusion. 3. Partially visualized lucent area in the lateral aspect of the right sacral bone adjacent to the SI joint. This may represent degenerative changes and osteopenia although a lytic lesion is not excluded. MRI may provide better evaluation on a nonemergent/outpatient basis. Electronically Signed   By:  Anner Crete M.D.   On: 05/10/2019 21:09   DG C-Arm 1-60 Min  Result Date: 05/11/2019 CLINICAL DATA:  Right femur fracture fixation. EXAM: RIGHT FEMUR 2 VIEWS; DG C-ARM 1-60 MIN COMPARISON:  Radiographs and CT right femur 05/10/2019 and pelvis 05/01/2019. FINDINGS: C-arm fluoroscopy was provided in the operating room. 2 minutes and 23 seconds of fluoroscopy time. 7 spot fluoroscopic images are submitted. These demonstrate the placement of a right femoral intramedullary nail which is secured by 2 proximal and 4 distal interlocking screws. Final images demonstrate  near anatomic reduction of the main mid diaphyseal fracture fragments. No demonstrated complications. IMPRESSION: Intraoperative views during right femoral fracture ORIF. This fracture is likely pathologic based on recent CT findings. Electronically Signed   By: Richardean Sale M.D.   On: 05/11/2019 12:00   CT Renal Stone Study  Addendum Date: 05/01/2019   ADDENDUM REPORT: 05/01/2019 22:05 ADDENDUM: Following addendum to the findings above, originally discussed with ordering provider Dr. Roxanne Mins on [id number] at 5:16 a.m. Musculoskeletal: Expansile lytic lesions are present within the sacrum including a 4.2 cm lesion in the right sacral ala which extends into the right SI joint and a smaller infiltrating lytic focus in the left sacral ala measuring up to 2.4 cm (5/63) and a separate focus more posteriorly measuring 1.9 cm (5/69, heterogeneous marrow elsewhere in the sacrum is conspicuous as well and worrisome for additional site of osseous involvement. Electronically Signed   By: Lovena Le M.D.   On: 05/01/2019 22:05   Result Date: 05/01/2019 CLINICAL DATA:  Lower back pain for several days EXAM: CT ABDOMEN AND PELVIS WITHOUT CONTRAST TECHNIQUE: Multidetector CT imaging of the abdomen and pelvis was performed following the standard protocol without IV contrast. COMPARISON:  Lower back pain, unable to sleep FINDINGS: Lower chest: Some bandlike areas of atelectasis or scarring present in the lower chest. No consolidation or effusion. Normal heart size. No pericardial effusion. Hepatobiliary: Punctate calcification present in the anterior left lobe liver, stable from prior. No focal concerning liver lesion. Smooth hepatic surface contour. Extensive intra and extrahepatic biliary ductal dilatation, greater than expected for age or reservoir effect. No visible intraductal gallstones. No residual pneumatosis. Pancreas: Unremarkable. No pancreatic ductal dilatation or surrounding inflammatory changes. Spleen: Normal  in size without focal abnormality. Adrenals/Urinary Tract: Nodular thickening of the adrenal glands without discrete adrenal lesion, similar to prior. Enlarging simple attenuation cyst in the anterior interpolar left kidney measuring 4.2 cm in size and 5 HU attenuation. No worrisome features. Additional 1.1 cm subcapsular cyst in the right kidney measuring fluid attenuation. No visible or contour deforming worrisome renal lesions. No urolithiasis or hydronephrosis. Urinary bladder is unremarkable. Stomach/Bowel: Fluid-filled hiatal hernia. Distal stomach is unremarkable. Duodenal sweep takes a normal course across the abdomen. No small bowel dilatation or wall thickening. Mild distal small bowel fecalization. A normal appendix is visualized. Moderate stool burden in the colon. No colonic dilatation or wall thickening. Scattered colonic diverticula without focal pericolonic inflammation to suggest diverticulitis. Vascular/Lymphatic: Atherosclerotic plaque within the normal caliber aorta. No suspicious or enlarged lymph nodes in the included lymphatic chains. Reproductive: Normal appearance of the uterus and adnexal structures. Other: No free fluid.  No free air.  No bowel containing hernias. Musculoskeletal: Unchanged appearance of an L2 superior endplate compression deformity with approximately 40% height loss superiorly. Prior T12 compression deformity with vertebroplasty changes is also stable. No new fracture or compression deformity is seen. No left though hilar IMPRESSION: Expansile lytic lesions within the sacrum  with some endosteal scalloping. Appearance is concerning for possible myeloma versus metastatic disease. Correlation with patient history and serologies is recommended. No obstructive urolithiasis or hydronephrosis. Fluid-filled hiatal hernia, correlate for symptoms of reflux. Small bowel fecalization, correlate for slowed intestinal transit. Stable appearance of an L2 superior endplate compression  deformity prior T12 compression deformity with post vertebroplasty changes. Biliary ductal dilatation, greater than expected for age and reservoir effect. Consider liver serologies and if elevated MRCP could be obtained. These results were called by telephone at the time of interpretation on 05/01/2019 at 5:16 am to provider [facility] , who verbally acknowledged these results. Electronically Signed: By: Lovena Le M.D. On: 05/01/2019 05:17   DG Hip Unilat With Pelvis 2-3 Views Right  Result Date: 05/10/2019 CLINICAL DATA:  Right hip pain since a fall in the shower this morning. EXAM: DG HIP (WITH OR WITHOUT PELVIS) 2-3V RIGHT COMPARISON:  Radiographs dated 03/26/2019 FINDINGS: There is no evidence of hip fracture or dislocation. There is no evidence of arthropathy or other focal bone abnormality. IMPRESSION: Negative. Electronically Signed   By: Lorriane Shire M.D.   On: 05/10/2019 14:51   DG FEMUR, MIN 2 VIEWS RIGHT  Result Date: 05/11/2019 CLINICAL DATA:  Right femur fracture fixation. EXAM: RIGHT FEMUR 2 VIEWS; DG C-ARM 1-60 MIN COMPARISON:  Radiographs and CT right femur 05/10/2019 and pelvis 05/01/2019. FINDINGS: C-arm fluoroscopy was provided in the operating room. 2 minutes and 23 seconds of fluoroscopy time. 7 spot fluoroscopic images are submitted. These demonstrate the placement of a right femoral intramedullary nail which is secured by 2 proximal and 4 distal interlocking screws. Final images demonstrate near anatomic reduction of the main mid diaphyseal fracture fragments. No demonstrated complications. IMPRESSION: Intraoperative views during right femoral fracture ORIF. This fracture is likely pathologic based on recent CT findings. Electronically Signed   By: Richardean Sale M.D.   On: 05/11/2019 12:00   DG Femur Min 2 Views Right  Result Date: 05/10/2019 CLINICAL DATA:  Fall EXAM: RIGHT FEMUR 2 VIEWS COMPARISON:  Distal femur and right knee pain FINDINGS: Displaced spiral fracture of the  distal femoral diaphysis with valgus angulation and external rotation of the lower fracture fragment. No proximal femoral fracture is seen. Femoral head remains normally located. Moderate degenerative changes at the hip. No additional fractures seen distal to the diaphyseal fracture site. Dedicated knee radiographs were performed concurrently. Extensive overlying soft tissue swelling without soft tissue gas to suggest radiographic evidence of compound fracture. IMPRESSION: Displaced spiral fracture of the distal femoral diaphysis with valgus angulation and external rotation of the lower fracture fragment. Electronically Signed   By: Lovena Le M.D.   On: 05/10/2019 15:42   DG FEMUR PORT, MIN 2 VIEWS RIGHT  Result Date: 05/11/2019 CLINICAL DATA:  Postop right femoral nail placement EXAM: RIGHT FEMUR PORTABLE 2 VIEW COMPARISON:  None. FINDINGS: Mildly comminuted oblique fracture of the distal femoral diaphysis transfixed with a intramedullary nail and interlocking screws. 6 mm of anterior displacement. No other fracture or dislocation. Generalized osteopenia. Postsurgical changes in the surrounding soft tissues. IMPRESSION: 1. Mildly comminuted oblique fracture of the distal femoral diaphysis transfixed with an intramedullary nail and interlocking screws. 6 mm of anterior displacement. Electronically Signed   By: Kathreen Devoid   On: 05/11/2019 12:40    Labs:  CBC: Recent Labs    05/14/19 0251 05/15/19 0357 05/16/19 0628 05/17/19 0454  WBC 4.9 4.7 3.5* 3.7*  HGB 9.5* 9.0* 9.2* 8.8*  HCT 29.3* 28.3* 28.9*  27.3*  PLT 166 190 195 227    COAGS: Recent Labs    05/10/19 1459 05/11/19 0503  INR 1.0 1.0    BMP: Recent Labs    05/13/19 0407 05/14/19 0251 05/15/19 0357 05/17/19 0454  NA 137 138 138 134*  K 3.4* 4.1 3.9 2.9*  CL 100 102 98 91*  CO2 28 28 31 29   GLUCOSE 85 94 100* 91  BUN 14 12 12 10   CALCIUM 7.4* 7.8* 8.2* 8.2*  CREATININE 0.60 0.58 0.54 0.52  GFRNONAA >60 >60 >60 >60    GFRAA >60 >60 >60 >60    LIVER FUNCTION TESTS: Recent Labs    05/12/19 0602 05/13/19 0407 05/14/19 0251 05/15/19 0357  BILITOT 0.4 0.5 1.1 0.9  AST 14* 16 19 19   ALT 9 9 9 9   ALKPHOS 47 56 71 78  PROT 4.3* 4.2* 4.6* 4.7*  ALBUMIN 2.3* 2.0* 2.2* 2.1*    TUMOR MARKERS: No results for input(s): AFPTM, CEA, CA199, CHROMGRNA in the last 8760 hours.  Assessment and Plan:  79 y/o F with multiple known lytic bone lesions planned for outpatient workup, however she is currently admitted for right femoral fracture s/p intramedullary nailing and IR has been consulted for a biopsy of the sacral lesion while she is admitted. Patient history and imaging reviewed by Dr. Pascal Lux who approves patient for procedure.  Patient has been NPO since midnight, last dose of Lovenox 3/10 @ 0945. Afebrile, WBC 3.7, hgb 8.8, plt 227.  Risks and benefits of sacral lesion biopsy was discussed with the patient and/or patient's family including, but not limited to bleeding, infection, damage to adjacent structures or low yield requiring additional tests.  All of the questions were answered and there is agreement to proceed.  Consent signed and in chart.   Thank you for this interesting consult.  I greatly enjoyed meeting Courtney Arnold and look forward to participating in their care.  A copy of this report was sent to the requesting provider on this date.  Electronically Signed: Joaquim Nam, PA-C 05/17/2019, 9:34 AM   I spent a total of 40 Minutes  in face to face in clinical consultation, greater than 50% of which was counseling/coordinating care for sacral lesion biopsy.

## 2019-05-17 NOTE — Sedation Documentation (Signed)
tegaderm and gauze to sacral puncture biopsy site

## 2019-05-17 NOTE — Progress Notes (Signed)
Physical Therapy Treatment Patient Details Name: Courtney Arnold MRN: [id number] DOB: 04-24-1940 Today's Date: 05/17/2019    History of Present Illness Patient is a 79 y/o female presenting to the ED on 05/10/19 after a fall at home with R femur fx. S/p IM nail of R femur on 05/11/19 with patient currently WBAT. Patient with a PMH significant for AFib, T12 kyphoplasty, HTN, recent lytic lesions found on sacrum with continued work-up.     PT Comments    Pt remains very limited with functional mobility secondary to weakness, fatigue and R LE pain. She continues to require min-mod A for mobility with use of RW for transfers. Pt would continue to benefit from skilled physical therapy services at this time while admitted and after d/c to address the below listed limitations in order to improve overall safety and independence with functional mobility.  Of note, pt on RA during with SPO2 decreasing to 87%. PT reapplied 2L of supplemental O2 via  at end of session with SPO2 quickly increasing to >93%. RN present and aware.    Follow Up Recommendations  SNF;Supervision/Assistance - 24 hour     Equipment Recommendations  Other (comment)(defer to next venue of care)    Recommendations for Other Services       Precautions / Restrictions Precautions Precautions: Fall Precaution Comments: incontinent Restrictions Weight Bearing Restrictions: Yes RLE Weight Bearing: Weight bearing as tolerated    Mobility  Bed Mobility Overal bed mobility: Needs Assistance Bed Mobility: Supine to Sit     Supine to sit: Min assist;Mod assist     General bed mobility comments: increased time and effort, HOB flat, min A for movement of R LE off of bed, mod A for trunk elevation  Transfers Overall transfer level: Needs assistance Equipment used: Rolling walker (2 wheeled) Transfers: Sit to/from [facility] Sit to Stand: Min assist;Mod assist Stand pivot transfers: Min assist        General transfer comment: cueing for safe hand placement and technique with RW; assistance needed for stability with transition into standing from EOB; pt with posterior LOB x2 in standing requiring mod A to recover; assistance needed for RW management and for pivotal movement from bed to chair  Ambulation/Gait                 Stairs             Wheelchair Mobility    Modified Rankin (Stroke Patients Only)       Balance Overall balance assessment: Needs assistance Sitting-balance support: Feet supported Sitting balance-Leahy Scale: Fair     Standing balance support: Bilateral upper extremity supported;During functional activity Standing balance-Leahy Scale: Poor                              Cognition Arousal/Alertness: Awake/alert Behavior During Therapy: WFL for tasks assessed/performed Overall Cognitive Status: Impaired/Different from baseline Area of Impairment: Safety/judgement;Problem solving                         Safety/Judgement: Decreased awareness of safety   Problem Solving: Slow processing;Difficulty sequencing;Requires verbal cues        Exercises General Exercises - Lower Extremity Ankle Circles/Pumps: AROM;Both;10 reps;Seated Quad Sets: AROM;Strengthening;Right;10 reps;Seated Hip ABduction/ADduction: AAROM;Right;10 reps;Seated    General Comments        Pertinent Vitals/Pain Pain Assessment: Faces Faces Pain Scale: Hurts whole lot Pain Location: R LE with  mobility Pain Descriptors / Indicators: Aching;Discomfort;Grimacing;Guarding Pain Intervention(s): Monitored during session;Repositioned    Home Living                      Prior Function            PT Goals (current goals can now be found in the care plan section) Acute Rehab PT Goals PT Goal Formulation: With patient Time For Goal Achievement: 05/26/19 Potential to Achieve Goals: Good Progress towards PT goals: Progressing toward  goals    Frequency    Min 3X/week      PT Plan Current plan remains appropriate    Co-evaluation              AM-PAC PT "6 Clicks" Mobility   Outcome Measure  Help needed turning from your back to your side while in a flat bed without using bedrails?: A Lot Help needed moving from lying on your back to sitting on the side of a flat bed without using bedrails?: A Lot Help needed moving to and from a bed to a chair (including a wheelchair)?: A Little Help needed standing up from a chair using your arms (e.g., wheelchair or bedside chair)?: A Little Help needed to walk in hospital room?: A Lot Help needed climbing 3-5 steps with a railing? : Total 6 Click Score: 13    End of Session Equipment Utilized During Treatment: Gait belt Activity Tolerance: Patient tolerated treatment well Patient left: in chair;with call bell/phone within reach;with chair alarm set Nurse Communication: Mobility status PT Visit Diagnosis: Unsteadiness on feet (R26.81);Other abnormalities of gait and mobility (R26.89);Muscle weakness (generalized) (M62.81)     Time: [id number] PT Time Calculation (min) (ACUTE ONLY): 16 min  Charges:  $Therapeutic Activity: 8-22 mins                     Anastasio Champion, DPT  Acute Rehabilitation Services Pager [phone] Office [place] 05/17/2019, 9:58 AM

## 2019-05-17 NOTE — Progress Notes (Addendum)
HEMATOLOGY-ONCOLOGY PROGRESS NOTE  SUBJECTIVE: Underwent biopsy of her dominant lytic lesion in the sacrum earlier today.  Tolerated procedure well.  Pain is overall well controlled.  She has no other complaints today.  REVIEW OF SYSTEMS:   Constitutional: Denies fevers, chills  Respiratory: Denies cough, dyspnea or wheezes Cardiovascular: Denies palpitation, chest discomfort Gastrointestinal:  Denies nausea, heartburn or change in bowel habits Skin: Denies abnormal skin rashes Neurological:Denies numbness, tingling or new weaknesses Behavioral/Psych: Mood is stable, no new changes  Extremities: No lower extremity edema All other systems were reviewed with the patient and are negative.  I have reviewed the past medical history, past surgical history, social history and family history with the patient and they are unchanged from previous note.   PHYSICAL EXAMINATION:  Vitals:   05/17/19 1305 05/17/19 1333  BP: 126/71 124/63  Pulse: (!) 110 99  Resp: 20 17  Temp:  98.4 F (36.9 C)  SpO2: 94% 99%   Filed Weights   05/11/19 0902  Weight: 54.4 kg    Intake/Output from previous day: 03/10 0701 - 03/11 0700 In: 360 [P.O.:360] Out: -   General: Elderly female, no distress Respiratory: lungs were clear bilaterally without wheezing or crackles.   Cardiovascular:  Regular rate and rhythm, S1/S2, without murmur, rub or gallop. Trace right lower extremity edema. GI:  abdomen was soft, flat, nontender, nondistended, without organomegaly.   Musculoskeletal:  no spinal tenderness of palpation of vertebral spine.   Skin: Well-healing incisions to the right leg, Steri-Strips intact Neuro exam was nonfocal. Patient was alert and oriented.  Attention was good.  Language was appropriate.  Mood was normal without depression.  Speech was not pressured.  Thought content was not tangential.    LABORATORY DATA:  I have reviewed the data as listed CMP Latest Ref Rng & Units 05/17/2019 05/15/2019  05/14/2019  Glucose 70 - 99 mg/dL 91 100(H) 94  BUN 8 - 23 mg/dL 10 12 12   Creatinine 0.44 - 1.00 mg/dL 0.52 0.54 0.58  Sodium 135 - 145 mmol/L 134(L) 138 138  Potassium 3.5 - 5.1 mmol/L 2.9(L) 3.9 4.1  Chloride 98 - 111 mmol/L 91(L) 98 102  CO2 22 - 32 mmol/L 29 31 28   Calcium 8.9 - 10.3 mg/dL 8.2(L) 8.2(L) 7.8(L)  Total Protein 6.5 - 8.1 g/dL - 4.7(L) 4.6(L)  Total Bilirubin 0.3 - 1.2 mg/dL - 0.9 1.1  Alkaline Phos 38 - 126 U/L - 78 71  AST 15 - 41 U/L - 19 19  ALT 0 - 44 U/L - 9 9    Lab Results  Component Value Date   WBC 3.7 (L) 05/17/2019   HGB 8.8 (L) 05/17/2019   HCT 27.3 (L) 05/17/2019   MCV 88.3 05/17/2019   PLT 227 05/17/2019   NEUTROABS 2.5 05/11/2019    DG Chest 1 View  Result Date: 05/10/2019 CLINICAL DATA:  Trauma secondary to a fall in the shower this morning. EXAM: CHEST  1 VIEW COMPARISON:  06/11/2015 FINDINGS: Heart size and pulmonary vascularity are normal. Lungs are clear. No effusions. Multiple old thoracic compression fractures some of which have been treated with vertebroplasty. There is a new compression fracture of T10 since 06/11/2015. IMPRESSION: New compression fracture of T10 since 06/11/2015. No acute cardiopulmonary abnormalities. Electronically Signed   By: Lorriane Shire M.D.   On: 05/10/2019 14:50   DG Knee 1-2 Views Right  Result Date: 05/10/2019 CLINICAL DATA:  Fall EXAM: RIGHT KNEE - 1-2 VIEW COMPARISON:  Concurrent femur radiograph. FINDINGS:  Displaced distal femoral diaphysis fracture better assessed on dedicated femur radiographs performed concurrently. Mild tricompartmental degenerative changes of the knee. No other acute fracture or traumatic malalignment is seen. Small effusion is present. IMPRESSION: Displaced distal femoral fracture better assessed on dedicated femur radiographs performed concurrently. Trace knee effusion.  No additional fractures. Electronically Signed   By: Lovena Le M.D.   On: 05/10/2019 15:43   CT CHEST W  CONTRAST  Result Date: 05/15/2019 CLINICAL DATA:  Lytic lesions. And sucks EXAM: CT CHEST, ABDOMEN, AND PELVIS WITH CONTRAST TECHNIQUE: Multidetector CT imaging of the chest, abdomen and pelvis was performed following the standard protocol during bolus administration of intravenous contrast. CONTRAST:  111mL OMNIPAQUE IOHEXOL 300 MG/ML  SOLN COMPARISON:  None. FINDINGS: CT CHEST FINDINGS Cardiovascular: Heart size is relatively normal. Aortic calcifications are noted. There is no evidence for thoracic aortic dissection or aneurysm. There is no large centrally located pulmonary embolism. Detection of smaller pulmonary emboli is limited by contrast bolus timing. Mediastinum/Nodes: --No mediastinal or hilar lymphadenopathy. --No axillary lymphadenopathy. --No supraclavicular lymphadenopathy. --Normal thyroid gland. --The esophagus is unremarkable Lungs/Pleura: There are small to moderate-sized bilateral pleural effusions, right greater than left. Adjacent compressive atelectasis is noted. There are scattered bilateral ground-glass pulmonary opacities, greatest within the right middle lobe. There is no pneumothorax. There is some mild interlobular septal thickening. There is some bronchial wall thickening and mucus plugging at the lung bases bilaterally. Musculoskeletal: The patient is status post prior vertebral augmentation of the T6, T7, and T12 vertebral bodies. There is an age-indeterminate pathologic compression fracture of the T10 vertebral body. Within the T10 vertebral body there is a soft tissue mass measuring approximately 2.4 x 2.2 cm involving the left lateral aspect of the vertebral body with extension into the left pedicle. There is extension into the epidural space with mild spinal canal narrowing. CT ABDOMEN PELVIS FINDINGS Hepatobiliary: The liver is normal. Status post cholecystectomy.There is mild intrahepatic and extrahepatic biliary ductal dilatation without evidence for an obstructing lesion.  Pancreas: Normal contours without ductal dilatation. No peripancreatic fluid collection. Spleen: No splenic laceration or hematoma. Adrenals/Urinary Tract: --Adrenal glands: No adrenal hemorrhage. --Right kidney/ureter: No hydronephrosis or perinephric hematoma. --Left kidney/ureter: No hydronephrosis or perinephric hematoma. --Urinary bladder: Unremarkable. Stomach/Bowel: --Stomach/Duodenum: No hiatal hernia or other gastric abnormality. Normal duodenal course and caliber. --Small bowel: No dilatation or inflammation. --Colon: Rectosigmoid diverticulosis without acute inflammation. There is a large amount of stool throughout the visualized portions of the colon. --Appendix: Not visualized. No right lower quadrant inflammation or free fluid. Vascular/Lymphatic: Atherosclerotic calcification is present within the non-aneurysmal abdominal aorta, without hemodynamically significant stenosis. --No retroperitoneal lymphadenopathy. --No mesenteric lymphadenopathy. --No pelvic or inguinal lymphadenopathy. Reproductive: Unremarkable Other: There is presacral soft tissue edema without evidence for a clear cause. Body wall edema is noted. Musculoskeletal. Again noted are bilateral lytic soft tissue lesions involving the bilateral sacrum measuring up to approximately 3.2 cm on the right and 2.1 cm on the left. There is extensive asymmetric soft tissue swelling about the right lower extremity which is likely related to the patient's recent fracture and surgical intervention. There is a chronic appearing compression fracture of the L4 vertebral body. IMPRESSION: 1. Lytic lesions involving the bilateral sacrum and T10 vertebral body, concerning for osseous metastatic disease or myeloma. There is a pathologic compression fracture of the T10 vertebral body. No clear primary lesion was identified in the chest, abdomen, or pelvis. The sacral lesions are amenable to percutaneous biopsy as clinically indicated. 2. Small  to moderate  bilateral pleural effusions with associated compressive atelectasis. 3. Scattered bilateral ground-glass pulmonary opacities, greatest within the right middle lobe, may be secondary to infection versus pulmonary edema. 4. Large stool burden. Rectosigmoid diverticulosis without evidence for acute inflammation. 5. Soft tissue swelling about the right lower extremity is likely related to the patient's recent fracture and surgical intervention. 6. Again noted is biliary ductal dilatation. Correlation with laboratory studies and nonemergent MRCP is recommended. Aortic Atherosclerosis (ICD10-I70.0). Electronically Signed   By: Constance Holster M.D.   On: 05/15/2019 21:21   MR Lumbar Spine W Wo Contrast  Result Date: 05/14/2019 CLINICAL DATA:  Episodes of lower extremity weakness EXAM: MRI LUMBAR SPINE WITHOUT AND WITH CONTRAST TECHNIQUE: Multiplanar and multiecho pulse sequences of the lumbar spine were obtained without and with intravenous contrast. CONTRAST:  68mL GADAVIST GADOBUTROL 1 MMOL/ML IV SOLN COMPARISON:  MRI 2011, CT 2017 FINDINGS: Segmentation:  Standard. Alignment:  Anteroposterior alignment is maintained. Vertebrae: There is new loss of height at the superior endplate of L4 of nearly 50%. There is mild endplate retropulsion. However, there is no marrow edema to suggest acuity. Chronic compression deformity of T12 with cement augmentation. Marrow signal is mildly heterogeneous likely related to decreased mineralization. Partially imaged STIR hyperintense lesions of the sacrum better evaluated on concurrent dedicated imaging. Conus medullaris and cauda equina: Conus extends to the L2 level. Conus and cauda equina appear normal. No abnormal intrathecal enhancement. Paraspinal and other soft tissues: Left renal cyst. Posterior paraspinal muscle atrophy. Disc levels: L1-L2:  No canal or foraminal stenosis. L2-L3:  No canal or foraminal stenosis. L3-L4: Disc bulge with endplate osteophytic ridging and facet  arthropathy with ligamentum flavum infolding. Moderate canal stenosis with partial effacement of the lateral recesses. Mild right and mild to moderate left foraminal stenosis. L4-L5: Disc bulge with superimposed small central protrusion and facet arthropathy with ligamentum flavum infolding. Mild canal stenosis. Mild foraminal stenosis. L5-S1: Disc bulge with endplate osteophytic ridging facet arthropathy. No canal stenosis. Mild left greater than right foraminal stenosis. IMPRESSION: New but chronic appearing compression deformity of L4. Multilevel degenerative changes with some progression since remote study of 2011. Stenosis is greatest at L3-L4. Electronically Signed   By: Macy Mis M.D.   On: 05/14/2019 20:12   NM Bone Scan Whole Body  Result Date: 05/16/2019 CLINICAL DATA:  Lytic bone lesions.  Initial staging. EXAM: NUCLEAR MEDICINE WHOLE BODY BONE SCAN TECHNIQUE: Whole body anterior and posterior images were obtained approximately 3 hours after intravenous injection of radiopharmaceutical. RADIOPHARMACEUTICALS:  21.9 mCi Technetium-69m MDP IV COMPARISON:  CT chest, abdomen and pelvis of 05/15/2019 and femoral x-rays from 05/11/2019 FINDINGS: Signs of uptake in the right distal femoral shaft along the site of previous at identified fracture transfixed by a femoral rod. Intense radiotracer activity noted over the left proximal tibia and at the T10 level in the spine. Signs of patchy calvarial uptake. Normal urinary tract activity. Urinary bladder is cures much of the sacrum. Relative photopenia in the distal left femur. Just above the knee. Also with relative photopenia along the distal tibia on the left. Photopenia particularly about the right proximal humerus is suggested. IMPRESSION: 1. Areas of increased uptake at site of right femoral fracture, along the right femur following surgery and the in the T10 vertebral body, all areas of postprocedural change following fracture. 2. Areas of relative  photopenia in the left femur, left tibia and right proximal humerus, consider large lytic foci in these locations. 3. Increased radiotracer uptake  in the left tibia proximally away from the joint may also represent another site of involvement. There are other questionable areas of photopenia scattered throughout the visualized skeleton. Consider skeletal survey for further assessment as lytic lesions are often photopenic and therefore less visible on bone scan. This will include calvarial assessment which would be helpful given areas of variable calvarial uptake. 4. There is some subtle uptake along the flanks bilaterally that may reflect changes of anasarca. Electronically Signed   By: Zetta Bills M.D.   On: 05/16/2019 17:46   CT ABDOMEN PELVIS W CONTRAST  Result Date: 05/15/2019 CLINICAL DATA:  Lytic lesions. And sucks EXAM: CT CHEST, ABDOMEN, AND PELVIS WITH CONTRAST TECHNIQUE: Multidetector CT imaging of the chest, abdomen and pelvis was performed following the standard protocol during bolus administration of intravenous contrast. CONTRAST:  188mL OMNIPAQUE IOHEXOL 300 MG/ML  SOLN COMPARISON:  None. FINDINGS: CT CHEST FINDINGS Cardiovascular: Heart size is relatively normal. Aortic calcifications are noted. There is no evidence for thoracic aortic dissection or aneurysm. There is no large centrally located pulmonary embolism. Detection of smaller pulmonary emboli is limited by contrast bolus timing. Mediastinum/Nodes: --No mediastinal or hilar lymphadenopathy. --No axillary lymphadenopathy. --No supraclavicular lymphadenopathy. --Normal thyroid gland. --The esophagus is unremarkable Lungs/Pleura: There are small to moderate-sized bilateral pleural effusions, right greater than left. Adjacent compressive atelectasis is noted. There are scattered bilateral ground-glass pulmonary opacities, greatest within the right middle lobe. There is no pneumothorax. There is some mild interlobular septal thickening.  There is some bronchial wall thickening and mucus plugging at the lung bases bilaterally. Musculoskeletal: The patient is status post prior vertebral augmentation of the T6, T7, and T12 vertebral bodies. There is an age-indeterminate pathologic compression fracture of the T10 vertebral body. Within the T10 vertebral body there is a soft tissue mass measuring approximately 2.4 x 2.2 cm involving the left lateral aspect of the vertebral body with extension into the left pedicle. There is extension into the epidural space with mild spinal canal narrowing. CT ABDOMEN PELVIS FINDINGS Hepatobiliary: The liver is normal. Status post cholecystectomy.There is mild intrahepatic and extrahepatic biliary ductal dilatation without evidence for an obstructing lesion. Pancreas: Normal contours without ductal dilatation. No peripancreatic fluid collection. Spleen: No splenic laceration or hematoma. Adrenals/Urinary Tract: --Adrenal glands: No adrenal hemorrhage. --Right kidney/ureter: No hydronephrosis or perinephric hematoma. --Left kidney/ureter: No hydronephrosis or perinephric hematoma. --Urinary bladder: Unremarkable. Stomach/Bowel: --Stomach/Duodenum: No hiatal hernia or other gastric abnormality. Normal duodenal course and caliber. --Small bowel: No dilatation or inflammation. --Colon: Rectosigmoid diverticulosis without acute inflammation. There is a large amount of stool throughout the visualized portions of the colon. --Appendix: Not visualized. No right lower quadrant inflammation or free fluid. Vascular/Lymphatic: Atherosclerotic calcification is present within the non-aneurysmal abdominal aorta, without hemodynamically significant stenosis. --No retroperitoneal lymphadenopathy. --No mesenteric lymphadenopathy. --No pelvic or inguinal lymphadenopathy. Reproductive: Unremarkable Other: There is presacral soft tissue edema without evidence for a clear cause. Body wall edema is noted. Musculoskeletal. Again noted are  bilateral lytic soft tissue lesions involving the bilateral sacrum measuring up to approximately 3.2 cm on the right and 2.1 cm on the left. There is extensive asymmetric soft tissue swelling about the right lower extremity which is likely related to the patient's recent fracture and surgical intervention. There is a chronic appearing compression fracture of the L4 vertebral body. IMPRESSION: 1. Lytic lesions involving the bilateral sacrum and T10 vertebral body, concerning for osseous metastatic disease or myeloma. There is a pathologic compression fracture of the T10 vertebral  body. No clear primary lesion was identified in the chest, abdomen, or pelvis. The sacral lesions are amenable to percutaneous biopsy as clinically indicated. 2. Small to moderate bilateral pleural effusions with associated compressive atelectasis. 3. Scattered bilateral ground-glass pulmonary opacities, greatest within the right middle lobe, may be secondary to infection versus pulmonary edema. 4. Large stool burden. Rectosigmoid diverticulosis without evidence for acute inflammation. 5. Soft tissue swelling about the right lower extremity is likely related to the patient's recent fracture and surgical intervention. 6. Again noted is biliary ductal dilatation. Correlation with laboratory studies and nonemergent MRCP is recommended. Aortic Atherosclerosis (ICD10-I70.0). Electronically Signed   By: Constance Holster M.D.   On: 05/15/2019 21:21   MR SACRUM SI JOINTS W WO CONTRAST  Result Date: 05/15/2019 CLINICAL DATA:  Sacral lesions on prior CT examination, for further investigation. EXAM: MRI PELVIS WITHOUT AND WITH CONTRAST TECHNIQUE: Multiplanar multisequence MR imaging of the pelvis was performed both before and after administration of intravenous contrast. CONTRAST:  56mL GADAVIST GADOBUTROL 1 MMOL/ML IV SOLN COMPARISON:  CT pelvis 05/01/2019 FINDINGS: Osseous structures: Expansile likely enhancing 4.3 by 2.8 by 3.3 cm lesion of the  right upper sacral ala noted with bulging anterior contour and high precontrast T2 signal. This mildly abuts and displaces the right sacral plexus. Separate but similar left sacral lesion on image 12/15 measures 2.3 by 1.9 by 1.9 cm. A third similar but smaller lesion is present in the right posterior acetabular wall measuring 1.3 by 1.0 by 1.0 cm on image 23/13. Please note that today's exam was focused on the bony sacrum rather than the entire bony pelvis. Musculoskeletal: Normal appearance of the piriformis musculature and visualized iliopsoas musculature. Joints: The larger right sacral lesion abuts the sacral side of the SI joint. No SI joint effusion is identified. Limited assessment of the hip joints demonstrates upper normal amount of fluid. Soft tissues: Lower presacral high T2 and low T1 signal favoring presacral edema, nonspecific. This appears increased compared to the CT examination from 05/01/2019. Sigmoid colon diverticulosis. IMPRESSION: 1. Expansile lesions of the right upper sacral ala, left upper sacrum, and right posterior acetabular wall. The larger right sacral lesion abuts and displaces the right sacral plexus. Top differential diagnostic considerations include myeloma and metastatic disease. 2. No other pelvic masses are identified. 3. Nonspecific presacral edema, increased compared to 05/01/2019. 4. Sigmoid colon diverticulosis. Electronically Signed   By: Van Clines M.D.   On: 05/15/2019 08:10   CT FEMUR RIGHT WO CONTRAST  Result Date: 05/10/2019 CLINICAL DATA:  79 year old female with trauma to the right lower extremity. EXAM: CT OF THE LOWER RIGHT EXTREMITY WITHOUT CONTRAST TECHNIQUE: Multidetector CT imaging of the right lower extremity was performed according to the standard protocol. COMPARISON:  Right lower extremity radiograph dated 05/10/2019. FINDINGS: Bones/Joint/Cartilage There is a displaced spiral fracture of the distal third of the right femoral diaphysis. There is  external rotation and valgus angulation of the distal fracture fragment. The bones are osteopenic. There is no dislocation. There is a small suprapatellar effusion. Partially visualized lucent area in the lateral aspect of the right sacral bone adjacent to the sacroiliac joint (series 3, image 1) noted which is not characterized and appears new since the study of 06/11/2015. This may be represent degenerative changes and osteopenia although a lytic lesion is not excluded. Ligaments Suboptimally assessed by CT. Muscles and Tendons No intramuscular hematoma or large fluid collection. Soft tissues Sigmoid diverticulosis. There is stranding and edema in the deep fascial of  the right thigh as well as edema of the subcutaneous soft tissues of the distal thigh and knee. IMPRESSION: 1. Displaced spiral fracture of the distal third of the right femoral diaphysis with external rotation and valgus angulation of the distal fracture fragment. No dislocation. 2. Small suprapatellar effusion. 3. Partially visualized lucent area in the lateral aspect of the right sacral bone adjacent to the SI joint. This may represent degenerative changes and osteopenia although a lytic lesion is not excluded. MRI may provide better evaluation on a nonemergent/outpatient basis. Electronically Signed   By: Anner Crete M.D.   On: 05/10/2019 21:09   DG C-Arm 1-60 Min  Result Date: 05/11/2019 CLINICAL DATA:  Right femur fracture fixation. EXAM: RIGHT FEMUR 2 VIEWS; DG C-ARM 1-60 MIN COMPARISON:  Radiographs and CT right femur 05/10/2019 and pelvis 05/01/2019. FINDINGS: C-arm fluoroscopy was provided in the operating room. 2 minutes and 23 seconds of fluoroscopy time. 7 spot fluoroscopic images are submitted. These demonstrate the placement of a right femoral intramedullary nail which is secured by 2 proximal and 4 distal interlocking screws. Final images demonstrate near anatomic reduction of the main mid diaphyseal fracture fragments. No  demonstrated complications. IMPRESSION: Intraoperative views during right femoral fracture ORIF. This fracture is likely pathologic based on recent CT findings. Electronically Signed   By: Richardean Sale M.D.   On: 05/11/2019 12:00   CT Renal Stone Study  Addendum Date: 05/01/2019   ADDENDUM REPORT: 05/01/2019 22:05 ADDENDUM: Following addendum to the findings above, originally discussed with ordering provider Dr. Roxanne Mins on [id number] at 5:16 a.m. Musculoskeletal: Expansile lytic lesions are present within the sacrum including a 4.2 cm lesion in the right sacral ala which extends into the right SI joint and a smaller infiltrating lytic focus in the left sacral ala measuring up to 2.4 cm (5/63) and a separate focus more posteriorly measuring 1.9 cm (5/69, heterogeneous marrow elsewhere in the sacrum is conspicuous as well and worrisome for additional site of osseous involvement. Electronically Signed   By: Lovena Le M.D.   On: 05/01/2019 22:05   Result Date: 05/01/2019 CLINICAL DATA:  Lower back pain for several days EXAM: CT ABDOMEN AND PELVIS WITHOUT CONTRAST TECHNIQUE: Multidetector CT imaging of the abdomen and pelvis was performed following the standard protocol without IV contrast. COMPARISON:  Lower back pain, unable to sleep FINDINGS: Lower chest: Some bandlike areas of atelectasis or scarring present in the lower chest. No consolidation or effusion. Normal heart size. No pericardial effusion. Hepatobiliary: Punctate calcification present in the anterior left lobe liver, stable from prior. No focal concerning liver lesion. Smooth hepatic surface contour. Extensive intra and extrahepatic biliary ductal dilatation, greater than expected for age or reservoir effect. No visible intraductal gallstones. No residual pneumatosis. Pancreas: Unremarkable. No pancreatic ductal dilatation or surrounding inflammatory changes. Spleen: Normal in size without focal abnormality. Adrenals/Urinary Tract: Nodular  thickening of the adrenal glands without discrete adrenal lesion, similar to prior. Enlarging simple attenuation cyst in the anterior interpolar left kidney measuring 4.2 cm in size and 5 HU attenuation. No worrisome features. Additional 1.1 cm subcapsular cyst in the right kidney measuring fluid attenuation. No visible or contour deforming worrisome renal lesions. No urolithiasis or hydronephrosis. Urinary bladder is unremarkable. Stomach/Bowel: Fluid-filled hiatal hernia. Distal stomach is unremarkable. Duodenal sweep takes a normal course across the abdomen. No small bowel dilatation or wall thickening. Mild distal small bowel fecalization. A normal appendix is visualized. Moderate stool burden in the colon. No colonic dilatation or wall thickening.  Scattered colonic diverticula without focal pericolonic inflammation to suggest diverticulitis. Vascular/Lymphatic: Atherosclerotic plaque within the normal caliber aorta. No suspicious or enlarged lymph nodes in the included lymphatic chains. Reproductive: Normal appearance of the uterus and adnexal structures. Other: No free fluid.  No free air.  No bowel containing hernias. Musculoskeletal: Unchanged appearance of an L2 superior endplate compression deformity with approximately 40% height loss superiorly. Prior T12 compression deformity with vertebroplasty changes is also stable. No new fracture or compression deformity is seen. No left though hilar IMPRESSION: Expansile lytic lesions within the sacrum with some endosteal scalloping. Appearance is concerning for possible myeloma versus metastatic disease. Correlation with patient history and serologies is recommended. No obstructive urolithiasis or hydronephrosis. Fluid-filled hiatal hernia, correlate for symptoms of reflux. Small bowel fecalization, correlate for slowed intestinal transit. Stable appearance of an L2 superior endplate compression deformity prior T12 compression deformity with post vertebroplasty  changes. Biliary ductal dilatation, greater than expected for age and reservoir effect. Consider liver serologies and if elevated MRCP could be obtained. These results were called by telephone at the time of interpretation on 05/01/2019 at 5:16 am to provider DAVID [facility] , who verbally acknowledged these results. Electronically Signed: By: Lovena Le M.D. On: 05/01/2019 05:17   DG Hip Unilat With Pelvis 2-3 Views Right  Result Date: 05/10/2019 CLINICAL DATA:  Right hip pain since a fall in the shower this morning. EXAM: DG HIP (WITH OR WITHOUT PELVIS) 2-3V RIGHT COMPARISON:  Radiographs dated 03/26/2019 FINDINGS: There is no evidence of hip fracture or dislocation. There is no evidence of arthropathy or other focal bone abnormality. IMPRESSION: Negative. Electronically Signed   By: Lorriane Shire M.D.   On: 05/10/2019 14:51   DG FEMUR, MIN 2 VIEWS RIGHT  Result Date: 05/11/2019 CLINICAL DATA:  Right femur fracture fixation. EXAM: RIGHT FEMUR 2 VIEWS; DG C-ARM 1-60 MIN COMPARISON:  Radiographs and CT right femur 05/10/2019 and pelvis 05/01/2019. FINDINGS: C-arm fluoroscopy was provided in the operating room. 2 minutes and 23 seconds of fluoroscopy time. 7 spot fluoroscopic images are submitted. These demonstrate the placement of a right femoral intramedullary nail which is secured by 2 proximal and 4 distal interlocking screws. Final images demonstrate near anatomic reduction of the main mid diaphyseal fracture fragments. No demonstrated complications. IMPRESSION: Intraoperative views during right femoral fracture ORIF. This fracture is likely pathologic based on recent CT findings. Electronically Signed   By: Richardean Sale M.D.   On: 05/11/2019 12:00   DG Femur Min 2 Views Right  Result Date: 05/10/2019 CLINICAL DATA:  Fall EXAM: RIGHT FEMUR 2 VIEWS COMPARISON:  Distal femur and right knee pain FINDINGS: Displaced spiral fracture of the distal femoral diaphysis with valgus angulation and external  rotation of the lower fracture fragment. No proximal femoral fracture is seen. Femoral head remains normally located. Moderate degenerative changes at the hip. No additional fractures seen distal to the diaphyseal fracture site. Dedicated knee radiographs were performed concurrently. Extensive overlying soft tissue swelling without soft tissue gas to suggest radiographic evidence of compound fracture. IMPRESSION: Displaced spiral fracture of the distal femoral diaphysis with valgus angulation and external rotation of the lower fracture fragment. Electronically Signed   By: Lovena Le M.D.   On: 05/10/2019 15:42   DG FEMUR PORT, MIN 2 VIEWS RIGHT  Result Date: 05/11/2019 CLINICAL DATA:  Postop right femoral nail placement EXAM: RIGHT FEMUR PORTABLE 2 VIEW COMPARISON:  None. FINDINGS: Mildly comminuted oblique fracture of the distal femoral diaphysis transfixed with a intramedullary  nail and interlocking screws. 6 mm of anterior displacement. No other fracture or dislocation. Generalized osteopenia. Postsurgical changes in the surrounding soft tissues. IMPRESSION: 1. Mildly comminuted oblique fracture of the distal femoral diaphysis transfixed with an intramedullary nail and interlocking screws. 6 mm of anterior displacement. Electronically Signed   By: Kathreen Devoid   On: 05/11/2019 12:40    ASSESSMENT AND PLAN: 1.  Lytic sacral bone lesions 2.  Anemia, multifactorial including blood loss, B12 deficiency, and likely underlying malignancy 3.  Closed fracture of the right femur 4.  Hypothyroidism 5.  Anxiety 6.  Atrial fibrillation  7.  Acute hypoxic respiratory failure 8.  Mild leukopenia  -The patient is status post CT-guided biopsy of her lytic bone lesion earlier today.  We will follow up on pathology.  The patient is awaiting placement at SNF.  She may discharge as soon as a bed is available and we we will arrange for follow-up at the [facility] to discuss the results and next  steps. -Hemoglobin remains overall stable.  She is status post 1 dose of vitamin B12 on 05/15/2019.  Monitor for now. -She is receiving Lasix for acute hypoxic respiratory failure with improvement.  Continue management per hospitalist.   LOS: 7 days   Mikey Bussing, DNP, AGPCNP-BC, AOCNP 05/17/19   Addendum  Pt underwent CT sacral bone biopsy this afternoon, tolerated well. It will likely take 2-3 days to have the path come back. Pt can be discharged to SNF when bed is ready, and I will call her when biopsy path returns and schedule her f/u with Dr. Delton Coombes at [facility].   Truitt Merle  05/17/2019

## 2019-05-17 NOTE — Discharge Summary (Addendum)
Orthopaedic Trauma Service (OTS) Discharge Summary   Patient ID: Courtney Arnold MRN: [id number] DOB/AGE: 10-Nov-1940 79 y.o.  Admit date: 05/10/2019 Discharge date: 05/21/2019  Admission Diagnoses: Right femur fracture  Discharge Diagnoses:  Principal Problem:   Closed fracture of right femur ([place]) Active Problems:   TOBACCO ABUSE   Compression fracture of L2 lumbar vertebra ([facility])   Bone lesion   Hypothyroidism   Unspecified atrial fibrillation ([facility])   Anxiety   Anemia, unspecified   Sacral lytic lesions   Past Medical History:  Diagnosis Date  . Atrial fibrillation ([place]) 2011   Postop, spontaneous conversion to normal sinus after one hour  . Compression fracture 07/24/09   T12; kyphoplasty  . History of echocardiogram 5/11   EF 65%  . Hypertension   . Thyroid disease   . Tobacco abuse      Procedures Performed: 1. Retrograde intramedullary nailing right femur 2. CT guided biopsy of dominant lytic lesion within the right side of the sacrum.   Discharged Condition: stable  Hospital Course: Patient presented to medical emergency department on 05/10/2019 after sustaining a fall.  Was found to have a right femur fracture.  Internal medicine was consulted for medical clearance for surgery.  Was taken to the operating room by Dr. Doreatha Martin on 05/11/2019 for retrograde IM nailing.  Patient tolerated the procedure well without complications.  Patient underwent work-up for lower back pain and bilateral lower extremity weakness while in the hospital.  Neurosurgery, interventional radiology, medical oncology involved in the patient's care.  Received a total 4 units of PRBCs throughout hospitalization.  Began working with physical and occupational therapies on postoperative day #1.  Was started on Lovenox for DVT prophylaxis once hemoglobin stabilized.. On 05/21/2019, the patient was tolerating diet, working well with therapies, pain well controlled, vital signs stable, dressings  clean, dry, intact and felt stable for discharge to skilled nursing facility. Patient will follow up as below and knows to call with questions or concerns.     Consults: Internal Medicine, Oncology, Neurosurgery, IR  Significant Diagnostic Studies:  - CT of the chest on 05/15/2019 showed small to moderate bilateral pleural effusions along with some groundglass opacities concerning for pulmonary edema versus infiltrate - MRI lumbar spine showed new but chronic appearing L4 compression deformity   Results for orders placed or performed during the hospital encounter of 05/10/19 (from the past 168 hour(s))  Comprehensive metabolic panel   Collection Time: 05/15/19  3:57 AM  Result Value Ref Range   Sodium 138 135 - 145 mmol/L   Potassium 3.9 3.5 - 5.1 mmol/L   Chloride 98 98 - 111 mmol/L   CO2 31 22 - 32 mmol/L   Glucose, Bld 100 (H) 70 - 99 mg/dL   BUN 12 8 - 23 mg/dL   Creatinine, Ser 0.54 0.44 - 1.00 mg/dL   Calcium 8.2 (L) 8.9 - 10.3 mg/dL   Total Protein 4.7 (L) 6.5 - 8.1 g/dL   Albumin 2.1 (L) 3.5 - 5.0 g/dL   AST 19 15 - 41 U/L   ALT 9 0 - 44 U/L   Alkaline Phosphatase 78 38 - 126 U/L   Total Bilirubin 0.9 0.3 - 1.2 mg/dL   GFR calc non Af Amer >60 >60 mL/min   GFR calc Af Amer >60 >60 mL/min   Anion gap 9 5 - 15  Magnesium   Collection Time: 05/15/19  3:57 AM  Result Value Ref Range   Magnesium 1.8 1.7 - 2.4 mg/dL  CBC   Collection Time: 05/15/19  3:57 AM  Result Value Ref Range   WBC 4.7 4.0 - 10.5 K/uL   RBC 3.13 (L) 3.87 - 5.11 MIL/uL   Hemoglobin 9.0 (L) 12.0 - 15.0 g/dL   HCT 28.3 (L) 36.0 - 46.0 %   MCV 90.4 80.0 - 100.0 fL   MCH 28.8 26.0 - 34.0 pg   MCHC 31.8 30.0 - 36.0 g/dL   RDW 15.1 11.5 - 15.5 %   Platelets 190 150 - 400 K/uL   nRBC 0.0 0.0 - 0.2 %  CBC   Collection Time: 05/16/19  6:28 AM  Result Value Ref Range   WBC 3.5 (L) 4.0 - 10.5 K/uL   RBC 3.20 (L) 3.87 - 5.11 MIL/uL   Hemoglobin 9.2 (L) 12.0 - 15.0 g/dL   HCT 28.9 (L) 36.0 - 46.0 %   MCV  90.3 80.0 - 100.0 fL   MCH 28.8 26.0 - 34.0 pg   MCHC 31.8 30.0 - 36.0 g/dL   RDW 14.9 11.5 - 15.5 %   Platelets 195 150 - 400 K/uL   nRBC 0.0 0.0 - 0.2 %  CBC   Collection Time: 05/17/19  4:54 AM  Result Value Ref Range   WBC 3.7 (L) 4.0 - 10.5 K/uL   RBC 3.09 (L) 3.87 - 5.11 MIL/uL   Hemoglobin 8.8 (L) 12.0 - 15.0 g/dL   HCT 27.3 (L) 36.0 - 46.0 %   MCV 88.3 80.0 - 100.0 fL   MCH 28.5 26.0 - 34.0 pg   MCHC 32.2 30.0 - 36.0 g/dL   RDW 15.0 11.5 - 15.5 %   Platelets 227 150 - 400 K/uL   nRBC 0.0 0.0 - 0.2 %  Basic metabolic panel   Collection Time: 05/17/19  4:54 AM  Result Value Ref Range   Sodium 134 (L) 135 - 145 mmol/L   Potassium 2.9 (L) 3.5 - 5.1 mmol/L   Chloride 91 (L) 98 - 111 mmol/L   CO2 29 22 - 32 mmol/L   Glucose, Bld 91 70 - 99 mg/dL   BUN 10 8 - 23 mg/dL   Creatinine, Ser 0.52 0.44 - 1.00 mg/dL   Calcium 8.2 (L) 8.9 - 10.3 mg/dL   GFR calc non Af Amer >60 >60 mL/min   GFR calc Af Amer >60 >60 mL/min   Anion gap 14 5 - 15  Magnesium   Collection Time: 05/17/19  4:54 AM  Result Value Ref Range   Magnesium 1.6 (L) 1.7 - 2.4 mg/dL  SARS CORONAVIRUS 2 (TAT 6-24 HRS) Nasopharyngeal Nasopharyngeal Swab   Collection Time: 05/17/19  6:15 PM   Specimen: Nasopharyngeal Swab  Result Value Ref Range   SARS Coronavirus 2 NEGATIVE NEGATIVE  Basic metabolic panel   Collection Time: 05/18/19  4:44 AM  Result Value Ref Range   Sodium 137 135 - 145 mmol/L   Potassium 3.4 (L) 3.5 - 5.1 mmol/L   Chloride 89 (L) 98 - 111 mmol/L   CO2 34 (H) 22 - 32 mmol/L   Glucose, Bld 103 (H) 70 - 99 mg/dL   BUN 14 8 - 23 mg/dL   Creatinine, Ser 0.58 0.44 - 1.00 mg/dL   Calcium 8.7 (L) 8.9 - 10.3 mg/dL   GFR calc non Af Amer >60 >60 mL/min   GFR calc Af Amer >60 >60 mL/min   Anion gap 14 5 - 15  Magnesium   Collection Time: 05/18/19  4:44 AM  Result Value Ref Range  Magnesium 2.0 1.7 - 2.4 mg/dL    Treatments: IV hydration, antibiotics: Ancef, analgesia: acetaminophen,  Morphine and Oxycodone, anticoagulation: LMW heparin, therapies: PT and OT, procedures: biopsy: right sacrum and surgery: retrograde nailing right femur  Discharge Exam: General - Sitting in bedside chair, NAD Respiratory - No increased work of breathing.  Right Lower Extremity - Incisions clean, dry, intact.  Steri-Strips in place but starting to peel off.  Moderate ecchymosis through the hip and thigh. No significant tenderness with palpation throughout extremity.  Ankle dorsiflexion/plantarflexion is intact.  Able to wiggle each of her toes. Sensation intact to light touch distally. Compartments are soft and compressible. Foot is cool but equal to contralateral side. +DP pulse  Disposition: Discharge disposition: 03-Skilled Nursing Facility        Allergies as of 05/21/2019   No Known Allergies     Medication List    STOP taking these medications   naproxen 500 MG tablet Commonly known as: NAPROSYN   ondansetron 4 MG tablet Commonly known as: ZOFRAN   Oxycodone HCl 10 MG Tabs     TAKE these medications   ALPRAZolam 0.5 MG tablet Commonly known as: XANAX Take 0.5 mg by mouth at bedtime.   carboxymethylcellulose 0.5 % Soln Commonly known as: REFRESH PLUS Place 1 drop into both eyes in the morning, at noon, in the evening, and at bedtime.   cyclobenzaprine 5 MG tablet Commonly known as: FLEXERIL Take 5 mg by mouth 3 (three) times daily as needed for muscle spasms.   enoxaparin 40 MG/0.4ML injection Commonly known as: LOVENOX Inject 0.4 mLs (40 mg total) into the skin daily.   HYDROcodone-acetaminophen 5-325 MG tablet Commonly known as: NORCO/VICODIN Take 1 tablet by mouth every 6 (six) hours as needed for severe pain. What changed: reasons to take this   levothyroxine 100 MCG tablet Commonly known as: SYNTHROID Take 1 tablet (100 mcg total) by mouth daily.   lidocaine 5 % Commonly known as: LIDODERM Place 1 patch onto the skin daily. Remove & Discard patch  within 12 hours or as directed by MD   Vitamin D 125 MCG (5000 UT) Caps Take 5,000 Units by mouth daily.            Durable Medical Equipment  (From admission, onward)         Start     Ordered   05/14/19 1144  For home use only DME oxygen  Once    Comments: SATURATION QUALIFICATIONS: (This note is used to comply with regulatory documentation for home oxygen) Patient Saturations on Room Air at Rest = 90% Patient Saturations on Room Air while Ambulating = 81% Patient Saturations on 2.5 Liters of oxygen while Ambulating = 93% Please briefly explain why patient needs home oxygen: Patient requires supplemental oxygen to maintain oxygen saturations at acceptable, safe levels with physical activity. -Patient meets criteria for home O2 -2 L O2 via [place], titrate to maintain SPO2> 88% -Provide [employer] home O2 portable concentrator  Question Answer Comment  Length of Need 12 Months   Mode or (Route) Nasal cannula   Liters per Minute 2   Frequency Continuous (stationary and portable oxygen unit needed)   Oxygen conserving device Yes   Oxygen delivery system Gas      05/14/19 1145          Contact information for follow-up providers    Haddix, Thomasene Lot, MD. Schedule an appointment as soon as possible for a visit in 2 week(s).   Specialty: Orthopedic  Surgery Why: for repeat x-rays, wound check Contact information: [place] [address] [phone]        Truitt Merle, MD. Call.   Specialties: Hematology, Oncology Why: Follow-up Contact information: [address] [place] [address] [phone]        Kristeen Miss, MD Follow up.   Specialty: Neurosurgery Why: follow up as needed Contact information: [address] [place] [address] [phone]            Contact information for after-discharge care    [facility] .   Service: Skilled Nursing Contact information: [address] [place] [phone]                  Discharge Instructions and Plan: Patient will be discharged to [facility] skilled nursing facility. Will be discharged on Lovenox for DVT prophylaxis. Will continue to be weightbearing as tolerated on the right leg. Patient will follow up with Dr. Doreatha Martin in 2 weeks for repeat x-rays and wound check. She will follow up with Medical oncology at the [facility] to discuss the results of her bone biopsy and next steps.   Signed:  Leary Roca. Carmie Kanner ?([phone]? (phone) 05/21/2019, 12:45 PM  [facility] [place] [address] [phone] (F)

## 2019-05-17 NOTE — Progress Notes (Addendum)
Orthopaedic Trauma Progress Note  S: Doing okay today, states her pain is well controlled. Underwent bone biopsy of sacral lesion with IR earlier today. Hopeful to be discharged soon   O:  Vitals:   05/17/19 1305 05/17/19 1333  BP: 126/71 124/63  Pulse: (!) 110 99  Resp: 20 17  Temp:  98.4 F (36.9 C)  SpO2: 94% 99%    General - Laying in bed, sleepy. NAD Respiratory - No increased work of breathing.  Right Lower Extremity - Incisions clean, dry, intact.  Steri-Strips in place but starting to peel off.  Moderate ecchymosis through the hip and thigh. Moderate swelling throughout extremity. No significant tenderness with palpation throughout extremity.  Ankle dorsiflexion/plantarflexion is intact.  Able to wiggle each of her toes. Sensation intact to light touch distally. Compartments are soft and compressible. Foot is cool but equal to contralateral side. +DP pulse  Imaging: Stable post op femur imaging.   Labs:  Results for orders placed or performed during the hospital encounter of 05/10/19 (from the past 24 hour(s))  CBC     Status: Abnormal   Collection Time: 05/17/19  4:54 AM  Result Value Ref Range   WBC 3.7 (L) 4.0 - 10.5 K/uL   RBC 3.09 (L) 3.87 - 5.11 MIL/uL   Hemoglobin 8.8 (L) 12.0 - 15.0 g/dL   HCT 27.3 (L) 36.0 - 46.0 %   MCV 88.3 80.0 - 100.0 fL   MCH 28.5 26.0 - 34.0 pg   MCHC 32.2 30.0 - 36.0 g/dL   RDW 15.0 11.5 - 15.5 %   Platelets 227 150 - 400 K/uL   nRBC 0.0 0.0 - 0.2 %  Basic metabolic panel     Status: Abnormal   Collection Time: 05/17/19  4:54 AM  Result Value Ref Range   Sodium 134 (L) 135 - 145 mmol/L   Potassium 2.9 (L) 3.5 - 5.1 mmol/L   Chloride 91 (L) 98 - 111 mmol/L   CO2 29 22 - 32 mmol/L   Glucose, Bld 91 70 - 99 mg/dL   BUN 10 8 - 23 mg/dL   Creatinine, Ser 0.52 0.44 - 1.00 mg/dL   Calcium 8.2 (L) 8.9 - 10.3 mg/dL   GFR calc non Af Amer >60 >60 mL/min   GFR calc Af Amer >60 >60 mL/min   Anion gap 14 5 - 15  Magnesium     Status:  Abnormal   Collection Time: 05/17/19  4:54 AM  Result Value Ref Range   Magnesium 1.6 (L) 1.7 - 2.4 mg/dL    Assessment: 79 year old female status post fall, 6 Days Post-Op   Injuries: Right femoral shaft fracture status post retrograde IM nail  Weightbearing: WBAT RLE  Insicional and dressing care: Incisions can be left open to air  Showering: Okay to begin showering and getting incisions wet.  Orthopedic device(s): None   CV/Blood loss: Acute blood loss anemia, Hgb 8.8 this morning. Hemodynamically stable. Received 1 unit PRBCs 05/13/2019.   Pain management:  1. Tylenol 1000 mg q 6 hours scheduled 2. Robaxin 500 mg q 6 hours PRN 3. Oxycodone 5-10 mg q 4 hours PRN 4. Morphine 1-2 mg q 2 hours PRN  VTE prophylaxis:  Lovenox, SCDs  ID:  Ancef 2gm post op completed  Foley/Lines: No foley.  KVO IVFs  Medical co-morbidities: atrial fibrillation, compression fracture T12 s/p kyphoplasty, HTN, Anemia, Hypothyroidism, Anxiety  Impediments to Fracture Healing: Vitamin D level 22, continue D3 supplementation  Dispo: Up with therapies as  tolerated. PT/OT recommending SNF. Discharge once cleared by neurosurgery, oncology, medicine teams.   Follow - up plan: 2 weeks for wound check and repeat x-rays  Contact information:  Katha Hamming MD, Patrecia Pace PA-C   Jase Himmelberger A. Carmie Kanner [facility] [phone] (office) [email or web address]

## 2019-05-17 NOTE — Plan of Care (Signed)
  Problem: Education: Goal: Knowledge of General Education information will improve Description: Including pain rating scale, medication(s)/side effects and non-pharmacologic comfort measures Outcome: Progressing   Problem: Clinical Measurements: Goal: Ability to maintain clinical measurements within normal limits will improve Outcome: Progressing   Problem: Clinical Measurements: Goal: Will remain free from infection Outcome: Progressing   Problem: Clinical Measurements: Goal: Cardiovascular complication will be avoided Outcome: Progressing   Problem: Elimination: Goal: Will not experience complications related to bowel motility Outcome: Progressing   Problem: Safety: Goal: Ability to remain free from injury will improve Outcome: Progressing   Problem: Skin Integrity: Goal: Risk for impaired skin integrity will decrease Outcome: Progressing

## 2019-05-17 NOTE — Progress Notes (Signed)
Time out actual time at 1251

## 2019-05-17 NOTE — Plan of Care (Signed)
  Problem: Health Behavior/Discharge Planning: Goal: Ability to manage health-related needs will improve Outcome: Progressing   Problem: Pain Managment: Goal: General experience of comfort will improve Outcome: Progressing   

## 2019-05-17 NOTE — Procedures (Signed)
Pre procedural Dx: Lytic sacral lesion Post procedural Dx: Same  Technically successful CT guided biopsy of dominant lytic lesion within the right side of the sacrum.   EBL: Minimal Complications: None immediate.   Ronny Bacon, MD Pager #: [phone]

## 2019-05-17 NOTE — Progress Notes (Signed)
Strict intake and output ordered. Pt having urgency with voiding after receiving IV lasix and sometimes unable to wait for bedpan. Will attempt to record accurate output for pt.

## 2019-05-17 NOTE — Progress Notes (Signed)
Occupational Therapy Treatment Patient Details Name: Courtney Arnold MRN: [id number] DOB: December 29, 1940 Today's Date: 05/17/2019    History of present illness Patient is a 79 y/o female presenting to the ED on 05/10/19 after a fall at home with R femur fx. S/p IM nail of R femur on 05/11/19 with patient currently WBAT. Patient with a PMH significant for AFib, T12 kyphoplasty, HTN, recent lytic lesions found on sacrum with continued work-up.    OT comments  Pt making progress towards OT goals this session, on 2L O2 throughout session with SpO2 >90%. Session limited by pain and fatigue today. Pt talking about procedure later today. Able to complete grooming tasks seated in recliner with set up, educated on sequence and compensatory strategies for LB ADL. Pt required mod A overall for transfers (vc for safety, sequencing and hand placement). Current POC remains appropriate. OT will continue to follow acutely.   Follow Up Recommendations  SNF;Supervision/Assistance - 24 hour    Equipment Recommendations  Other (comment)(defer to next venue of care)    Recommendations for Other Services      Precautions / Restrictions Precautions Precautions: Fall Precaution Comments: incontinent Restrictions Weight Bearing Restrictions: Yes RLE Weight Bearing: Weight bearing as tolerated       Mobility Bed Mobility               General bed mobility comments: OOB at beginning and end of session  Transfers Overall transfer level: Needs assistance Equipment used: Rolling walker (2 wheeled) Transfers: Sit to/from [facility] Sit to Stand: Mod assist Stand pivot transfers: Min assist       General transfer comment: multimodal cues for safety, sequencing and balance,    Balance Overall balance assessment: Needs assistance Sitting-balance support: Feet supported Sitting balance-Leahy Scale: Fair     Standing balance support: Bilateral upper extremity supported;During functional  activity Standing balance-Leahy Scale: Poor Standing balance comment: reliant on RW                           ADL either performed or assessed with clinical judgement   ADL Overall ADL's : Needs assistance/impaired     Grooming: Wash/dry hands;Wash/dry face;Oral care;Set up;Sitting Grooming Details (indicate cue type and reason): in recliner                 Toilet Transfer: Minimal assistance;Stand-pivot;BSC;RW Toilet Transfer Details (indicate cue type and reason): vc for safe hand placement and sequencing, assist for boost and balance Toileting- Clothing Manipulation and Hygiene: Maximal assistance Toileting - Clothing Manipulation Details (indicate cue type and reason): OT able to perform in standing while patient balance with RW     Functional mobility during ADLs: Moderate assistance;Cueing for sequencing;Rolling walker General ADL Comments: limited by pain     Vision       Perception     Praxis      Cognition Arousal/Alertness: Awake/alert Behavior During Therapy: WFL for tasks assessed/performed Overall Cognitive Status: Impaired/Different from baseline Area of Impairment: Safety/judgement;Problem solving                         Safety/Judgement: Decreased awareness of safety   Problem Solving: Slow processing;Difficulty sequencing;Requires verbal cues          Exercises     Shoulder Instructions       General Comments on 2LO2 throughout session SpO2>90    Pertinent Vitals/ Pain  Pain Assessment: 0-10 Pain Score: 6  Pain Location: R LE with WB Pain Descriptors / Indicators: Aching;Discomfort;Grimacing;Guarding  Home Living                                          Prior Functioning/Environment              Frequency  Min 2X/week        Progress Toward Goals  OT Goals(current goals can now be found in the care plan section)  Progress towards OT goals: Progressing toward goals  Acute  Rehab OT Goals Patient Stated Goal: reduce pain; go home OT Goal Formulation: With patient Time For Goal Achievement: 05/26/19 Potential to Achieve Goals: Good  Plan Discharge plan remains appropriate    Co-evaluation                 AM-PAC OT "6 Clicks" Daily Activity     Outcome Measure   Help from another person eating meals?: None Help from another person taking care of personal grooming?: A Little Help from another person toileting, which includes using toliet, bedpan, or urinal?: A Lot Help from another person bathing (including washing, rinsing, drying)?: A Lot Help from another person to put on and taking off regular upper body clothing?: A Little Help from another person to put on and taking off regular lower body clothing?: A Lot 6 Click Score: 16    End of Session Equipment Utilized During Treatment: Gait belt;Rolling walker;Oxygen  OT Visit Diagnosis: Muscle weakness (generalized) (M62.81);History of falling (Z91.81);Pain;Unsteadiness on feet (R26.81) Pain - Right/Left: Right Pain - part of body: Leg   Activity Tolerance Patient limited by pain;Patient limited by fatigue   Patient Left in chair;with call bell/phone within reach;with chair alarm set   Nurse Communication Mobility status        Time: [id number] OT Time Calculation (min): 16 min  Charges: OT General Charges $OT Visit: 1 Visit OT Treatments $Self Care/Home Management : 8-22 mins  Jesse Sans OTR/L Acute Rehabilitation Services Pager: [phone] Office: [place] 05/17/2019, 2:19 PM

## 2019-05-17 NOTE — Progress Notes (Signed)
Patient ID: KAIYLA FRANCE, female   DOB: 1940-04-01, 79 y.o.   MRN: [id number]  PROGRESS NOTE    MEKIA MCCREADY  [id number] DOB: 30-Jul-1940 DOA: 05/10/2019 PCP: [name], MD   Brief Narrative:  79 year old female with history of unspecified atrial fibrillation, compression fracture T12 status post kyphoplasty, hypertension, anemia, hypothyroidism, anxiety presented with lower back pain and bilateral lower extremity weakness causing her to fall.  She was found to have right femoral shaft fracture for which she was admitted under orthopedic service and underwent IM nailing.  She was also found to have compression L2 lumbar vertebral fracture along with sacral lytic lesions.  Hospitalist, neurosurgery and oncology services were consulted.  Assessment & Plan:   Acute hypoxic respiratory failure -Currently still requiring 2 L oxygen via nasal cannula.  Might need oxygen upon discharge as well -Incentive spirometry -CT of the chest on 05/15/2019 showed small to moderate bilateral pleural effusions along with some groundglass opacities concerning for pulmonary edema versus infiltrate -Wean off oxygen as able.  Received a dose of IV Lasix on 05/16/2019.  Strict input and output.  Daily weights.  We will give 1 more dose of Lasix 60 mg IV today.  Unspecified atrial fibrillation -Currently in normal sinus rhythm -Not on any medication for A. fib at home.  Closed right femur fracture -Management as per primary orthopedics team.  Status post IM nailing.  Pain management/wound care/activity as per orthopedics recommendations.  Sacral lytic lesions -Oncology following.  Oncology is recommending IR biopsy for bone lesion.  IR has been consulted.  Hypokalemia -Replace.  Repeat a.m. labs  Hypomagnesemia -Replace.  Repeat a.m. labs  L4 compression deformity - new but chronic appearing L4 compression deformity seen on MRI of lumbar spine.  No role for neurosurgical intervention as per  neurosurgery.  Outpatient follow-up  Hypothyroidism -Continue Synthroid  Anemia of chronic disease -Status post 3 units transfusion during this hospitalization.  Hemoglobin stable.  Transfuse if hemoglobin less than 7   Subjective: Patient seen and examined at bedside.  Poor historian.  No overnight fever, worsening shortness of breath, vomiting reported. Objective: Vitals:   05/16/19 1340 05/16/19 1933 05/17/19 0322 05/17/19 0732  BP: 129/67 130/70 125/61 123/60  Pulse: 98 99 100 99  Resp: 18 18 17 18   Temp: 98.2 F (36.8 C) 97.7 F (36.5 C) 98.1 F (36.7 C) 98.2 F (36.8 C)  TempSrc: Oral Oral Oral Oral  SpO2: 98% 99% 98% 98%  Weight:      Height:        Intake/Output Summary (Last 24 hours) at 05/17/2019 0804 Last data filed at 05/16/2019 1700 Gross per 24 hour  Intake 360 ml  Output --  Net 360 ml   Filed Weights   05/11/19 0902  Weight: 54.4 kg    Examination:  General exam: No acute distress.  Appears chronically ill. Respiratory system: Bilateral decreased breath sounds at bases with basilar crackles.  No wheezing  cardiovascular system: Rate controlled, S1-S2 heard Gastrointestinal system: Abdomen is nondistended, soft and nontender.  Bowel sounds are heard  extremities: Bilateral lower extremity trace edema present.  No cyanosis or clubbing.   Data Reviewed: I have personally reviewed following labs and imaging studies  CBC: Recent Labs  Lab 05/10/19 1459 05/10/19 1459 05/11/19 0503 05/11/19 2132 05/13/19 0407 05/14/19 0251 05/15/19 0357 05/16/19 0628 05/17/19 0454  WBC 6.2   < > 4.1  --  3.6* 4.9 4.7 3.5* 3.7*  NEUTROABS 4.8  --  2.5  --   --   --   --   --   --  HGB 6.9*   < > 10.4*   < > 7.0* 9.5* 9.0* 9.2* 8.8*  HCT 21.9*   < > 31.8*   < > 22.5* 29.3* 28.3* 28.9* 27.3*  MCV 88.3   < > 86.6  --  89.3 89.3 90.4 90.3 88.3  PLT 218   < > 154  --  140* 166 190 195 227   < > = values in this interval not displayed.   Basic Metabolic  Panel: Recent Labs  Lab 05/12/19 0602 05/13/19 0407 05/14/19 0251 05/15/19 0357 05/17/19 0454  NA 136 137 138 138 134*  K 3.7 3.4* 4.1 3.9 2.9*  CL 101 100 102 98 91*  CO2 28 28 28 31 29   GLUCOSE 93 85 94 100* 91  BUN 18 14 12 12 10   CREATININE 0.70 0.60 0.58 0.54 0.52  CALCIUM 8.0* 7.4* 7.8* 8.2* 8.2*  MG 1.7 2.2 1.9 1.8 1.6*   GFR: Estimated Creatinine Clearance: 46.6 mL/min (by C-G formula based on SCr of 0.52 mg/dL). Liver Function Tests: Recent Labs  Lab 05/11/19 0503 05/12/19 0602 05/13/19 0407 05/14/19 0251 05/15/19 0357  AST 15 14* 16 19 19   ALT 11 9 9 9 9   ALKPHOS 67 47 56 71 78  BILITOT 0.5 0.4 0.5 1.1 0.9  PROT 5.1* 4.3* 4.2* 4.6* 4.7*  ALBUMIN 2.6* 2.3* 2.0* 2.2* 2.1*   No results for input(s): LIPASE, AMYLASE in the last 168 hours. No results for input(s): AMMONIA in the last 168 hours. Coagulation Profile: Recent Labs  Lab 05/10/19 1459 05/11/19 0503  INR 1.0 1.0   Cardiac Enzymes: Recent Labs  Lab 05/10/19 1459  CKTOTAL 109   BNP (last 3 results) No results for input(s): PROBNP in the last 8760 hours. HbA1C: No results for input(s): HGBA1C in the last 72 hours. CBG: No results for input(s): GLUCAP in the last 168 hours. Lipid Profile: No results for input(s): CHOL, HDL, LDLCALC, TRIG, CHOLHDL, LDLDIRECT in the last 72 hours. Thyroid Function Tests: No results for input(s): TSH, T4TOTAL, FREET4, T3FREE, THYROIDAB in the last 72 hours. Anemia Panel: No results for input(s): VITAMINB12, FOLATE, FERRITIN, TIBC, IRON, RETICCTPCT in the last 72 hours. Sepsis Labs: No results for input(s): PROCALCITON, LATICACIDVEN in the last 168 hours.  Recent Results (from the past 240 hour(s))  Respiratory Panel by RT PCR (Flu A&B, Covid) - Nasopharyngeal Swab     Status: None   Collection Time: 05/10/19  3:50 PM   Specimen: Nasopharyngeal Swab  Result Value Ref Range Status   SARS Coronavirus 2 by RT PCR NEGATIVE NEGATIVE Final    Comment:  (NOTE) SARS-CoV-2 target nucleic acids are NOT DETECTED. The SARS-CoV-2 RNA is generally detectable in upper respiratoy specimens during the acute phase of infection. The lowest concentration of SARS-CoV-2 viral copies this assay can detect is 131 copies/mL. A negative result does not preclude SARS-Cov-2 infection and should not be used as the sole basis for treatment or other patient management decisions. A negative result may occur with  improper specimen collection/handling, submission of specimen other than nasopharyngeal swab, presence of viral mutation(s) within the areas targeted by this assay, and inadequate number of viral copies (<131 copies/mL). A negative result must be combined with clinical observations, patient history, and epidemiological information. The expected result is Negative. Fact Sheet for Patients:  PinkCheek.be Fact Sheet for Healthcare Providers:  GravelBags.it This test is not yet ap proved or cleared by the [place] FDA and  has been authorized for detection and/or  diagnosis of SARS-CoV-2 by FDA under an Emergency Use Authorization (EUA). This EUA will remain  in effect (meaning this test can be used) for the duration of the COVID-19 declaration under Section 564(b)(1) of the Act, 21 U.S.C. section 360bbb-3(b)(1), unless the authorization is terminated or revoked sooner.    Influenza A by PCR NEGATIVE NEGATIVE Final   Influenza B by PCR NEGATIVE NEGATIVE Final    Comment: (NOTE) The Xpert Xpress SARS-CoV-2/FLU/RSV assay is intended as an aid in  the diagnosis of influenza from Nasopharyngeal swab specimens and  should not be used as a sole basis for treatment. Nasal washings and  aspirates are unacceptable for Xpert Xpress SARS-CoV-2/FLU/RSV  testing. Fact Sheet for Patients: PinkCheek.be Fact Sheet for Healthcare  Providers: GravelBags.it This test is not yet approved or cleared by the [place] FDA and  has been authorized for detection and/or diagnosis of SARS-CoV-2 by  FDA under an Emergency Use Authorization (EUA). This EUA will remain  in effect (meaning this test can be used) for the duration of the  Covid-19 declaration under Section 564(b)(1) of the Act, 21  U.S.C. section 360bbb-3(b)(1), unless the authorization is  terminated or revoked. Performed at [facility], [place] [address], [place], [place] [address]   MRSA PCR Screening     Status: None   Collection Time: 05/10/19  9:57 PM   Specimen: Nasal Mucosa; Nasopharyngeal  Result Value Ref Range Status   MRSA by PCR NEGATIVE NEGATIVE Final    Comment:        The GeneXpert MRSA Assay (FDA approved for NASAL specimens only), is one component of a comprehensive MRSA colonization surveillance program. It is not intended to diagnose MRSA infection nor to guide or monitor treatment for MRSA infections. Performed at [facility], [place] [address] [place], [place] [address]          Radiology Studies: CT CHEST W CONTRAST  Result Date: 05/15/2019 CLINICAL DATA:  Lytic lesions. And sucks EXAM: CT CHEST, ABDOMEN, AND PELVIS WITH CONTRAST TECHNIQUE: Multidetector CT imaging of the chest, abdomen and pelvis was performed following the standard protocol during bolus administration of intravenous contrast. CONTRAST:  130mL OMNIPAQUE IOHEXOL 300 MG/ML  SOLN COMPARISON:  None. FINDINGS: CT CHEST FINDINGS Cardiovascular: Heart size is relatively normal. Aortic calcifications are noted. There is no evidence for thoracic aortic dissection or aneurysm. There is no large centrally located pulmonary embolism. Detection of smaller pulmonary emboli is limited by contrast bolus timing. Mediastinum/Nodes: --No mediastinal or hilar lymphadenopathy. --No axillary lymphadenopathy. --No supraclavicular lymphadenopathy.  --Normal thyroid gland. --The esophagus is unremarkable Lungs/Pleura: There are small to moderate-sized bilateral pleural effusions, right greater than left. Adjacent compressive atelectasis is noted. There are scattered bilateral ground-glass pulmonary opacities, greatest within the right middle lobe. There is no pneumothorax. There is some mild interlobular septal thickening. There is some bronchial wall thickening and mucus plugging at the lung bases bilaterally. Musculoskeletal: The patient is status post prior vertebral augmentation of the T6, T7, and T12 vertebral bodies. There is an age-indeterminate pathologic compression fracture of the T10 vertebral body. Within the T10 vertebral body there is a soft tissue mass measuring approximately 2.4 x 2.2 cm involving the left lateral aspect of the vertebral body with extension into the left pedicle. There is extension into the epidural space with mild spinal canal narrowing. CT ABDOMEN PELVIS FINDINGS Hepatobiliary: The liver is normal. Status post cholecystectomy.There is mild intrahepatic and extrahepatic biliary ductal dilatation without evidence for an  obstructing lesion. Pancreas: Normal contours without ductal dilatation. No peripancreatic fluid collection. Spleen: No splenic laceration or hematoma. Adrenals/Urinary Tract: --Adrenal glands: No adrenal hemorrhage. --Right kidney/ureter: No hydronephrosis or perinephric hematoma. --Left kidney/ureter: No hydronephrosis or perinephric hematoma. --Urinary bladder: Unremarkable. Stomach/Bowel: --Stomach/Duodenum: No hiatal hernia or other gastric abnormality. Normal duodenal course and caliber. --Small bowel: No dilatation or inflammation. --Colon: Rectosigmoid diverticulosis without acute inflammation. There is a large amount of stool throughout the visualized portions of the colon. --Appendix: Not visualized. No right lower quadrant inflammation or free fluid. Vascular/Lymphatic: Atherosclerotic calcification is  present within the non-aneurysmal abdominal aorta, without hemodynamically significant stenosis. --No retroperitoneal lymphadenopathy. --No mesenteric lymphadenopathy. --No pelvic or inguinal lymphadenopathy. Reproductive: Unremarkable Other: There is presacral soft tissue edema without evidence for a clear cause. Body wall edema is noted. Musculoskeletal. Again noted are bilateral lytic soft tissue lesions involving the bilateral sacrum measuring up to approximately 3.2 cm on the right and 2.1 cm on the left. There is extensive asymmetric soft tissue swelling about the right lower extremity which is likely related to the patient's recent fracture and surgical intervention. There is a chronic appearing compression fracture of the L4 vertebral body. IMPRESSION: 1. Lytic lesions involving the bilateral sacrum and T10 vertebral body, concerning for osseous metastatic disease or myeloma. There is a pathologic compression fracture of the T10 vertebral body. No clear primary lesion was identified in the chest, abdomen, or pelvis. The sacral lesions are amenable to percutaneous biopsy as clinically indicated. 2. Small to moderate bilateral pleural effusions with associated compressive atelectasis. 3. Scattered bilateral ground-glass pulmonary opacities, greatest within the right middle lobe, may be secondary to infection versus pulmonary edema. 4. Large stool burden. Rectosigmoid diverticulosis without evidence for acute inflammation. 5. Soft tissue swelling about the right lower extremity is likely related to the patient's recent fracture and surgical intervention. 6. Again noted is biliary ductal dilatation. Correlation with laboratory studies and nonemergent MRCP is recommended. Aortic Atherosclerosis (ICD10-I70.0). Electronically Signed   By: Constance Holster M.D.   On: 05/15/2019 21:21   NM Bone Scan Whole Body  Result Date: 05/16/2019 CLINICAL DATA:  Lytic bone lesions.  Initial staging. EXAM: NUCLEAR MEDICINE  WHOLE BODY BONE SCAN TECHNIQUE: Whole body anterior and posterior images were obtained approximately 3 hours after intravenous injection of radiopharmaceutical. RADIOPHARMACEUTICALS:  21.9 mCi Technetium-34m MDP IV COMPARISON:  CT chest, abdomen and pelvis of 05/15/2019 and femoral x-rays from 05/11/2019 FINDINGS: Signs of uptake in the right distal femoral shaft along the site of previous at identified fracture transfixed by a femoral rod. Intense radiotracer activity noted over the left proximal tibia and at the T10 level in the spine. Signs of patchy calvarial uptake. Normal urinary tract activity. Urinary bladder is cures much of the sacrum. Relative photopenia in the distal left femur. Just above the knee. Also with relative photopenia along the distal tibia on the left. Photopenia particularly about the right proximal humerus is suggested. IMPRESSION: 1. Areas of increased uptake at site of right femoral fracture, along the right femur following surgery and the in the T10 vertebral body, all areas of postprocedural change following fracture. 2. Areas of relative photopenia in the left femur, left tibia and right proximal humerus, consider large lytic foci in these locations. 3. Increased radiotracer uptake in the left tibia proximally away from the joint may also represent another site of involvement. There are other questionable areas of photopenia scattered throughout the visualized skeleton. Consider skeletal survey for further assessment as lytic lesions  are often photopenic and therefore less visible on bone scan. This will include calvarial assessment which would be helpful given areas of variable calvarial uptake. 4. There is some subtle uptake along the flanks bilaterally that may reflect changes of anasarca. Electronically Signed   By: Zetta Bills M.D.   On: 05/16/2019 17:46   CT ABDOMEN PELVIS W CONTRAST  Result Date: 05/15/2019 CLINICAL DATA:  Lytic lesions. And sucks EXAM: CT CHEST, ABDOMEN,  AND PELVIS WITH CONTRAST TECHNIQUE: Multidetector CT imaging of the chest, abdomen and pelvis was performed following the standard protocol during bolus administration of intravenous contrast. CONTRAST:  167mL OMNIPAQUE IOHEXOL 300 MG/ML  SOLN COMPARISON:  None. FINDINGS: CT CHEST FINDINGS Cardiovascular: Heart size is relatively normal. Aortic calcifications are noted. There is no evidence for thoracic aortic dissection or aneurysm. There is no large centrally located pulmonary embolism. Detection of smaller pulmonary emboli is limited by contrast bolus timing. Mediastinum/Nodes: --No mediastinal or hilar lymphadenopathy. --No axillary lymphadenopathy. --No supraclavicular lymphadenopathy. --Normal thyroid gland. --The esophagus is unremarkable Lungs/Pleura: There are small to moderate-sized bilateral pleural effusions, right greater than left. Adjacent compressive atelectasis is noted. There are scattered bilateral ground-glass pulmonary opacities, greatest within the right middle lobe. There is no pneumothorax. There is some mild interlobular septal thickening. There is some bronchial wall thickening and mucus plugging at the lung bases bilaterally. Musculoskeletal: The patient is status post prior vertebral augmentation of the T6, T7, and T12 vertebral bodies. There is an age-indeterminate pathologic compression fracture of the T10 vertebral body. Within the T10 vertebral body there is a soft tissue mass measuring approximately 2.4 x 2.2 cm involving the left lateral aspect of the vertebral body with extension into the left pedicle. There is extension into the epidural space with mild spinal canal narrowing. CT ABDOMEN PELVIS FINDINGS Hepatobiliary: The liver is normal. Status post cholecystectomy.There is mild intrahepatic and extrahepatic biliary ductal dilatation without evidence for an obstructing lesion. Pancreas: Normal contours without ductal dilatation. No peripancreatic fluid collection. Spleen: No  splenic laceration or hematoma. Adrenals/Urinary Tract: --Adrenal glands: No adrenal hemorrhage. --Right kidney/ureter: No hydronephrosis or perinephric hematoma. --Left kidney/ureter: No hydronephrosis or perinephric hematoma. --Urinary bladder: Unremarkable. Stomach/Bowel: --Stomach/Duodenum: No hiatal hernia or other gastric abnormality. Normal duodenal course and caliber. --Small bowel: No dilatation or inflammation. --Colon: Rectosigmoid diverticulosis without acute inflammation. There is a large amount of stool throughout the visualized portions of the colon. --Appendix: Not visualized. No right lower quadrant inflammation or free fluid. Vascular/Lymphatic: Atherosclerotic calcification is present within the non-aneurysmal abdominal aorta, without hemodynamically significant stenosis. --No retroperitoneal lymphadenopathy. --No mesenteric lymphadenopathy. --No pelvic or inguinal lymphadenopathy. Reproductive: Unremarkable Other: There is presacral soft tissue edema without evidence for a clear cause. Body wall edema is noted. Musculoskeletal. Again noted are bilateral lytic soft tissue lesions involving the bilateral sacrum measuring up to approximately 3.2 cm on the right and 2.1 cm on the left. There is extensive asymmetric soft tissue swelling about the right lower extremity which is likely related to the patient's recent fracture and surgical intervention. There is a chronic appearing compression fracture of the L4 vertebral body. IMPRESSION: 1. Lytic lesions involving the bilateral sacrum and T10 vertebral body, concerning for osseous metastatic disease or myeloma. There is a pathologic compression fracture of the T10 vertebral body. No clear primary lesion was identified in the chest, abdomen, or pelvis. The sacral lesions are amenable to percutaneous biopsy as clinically indicated. 2. Small to moderate bilateral pleural effusions with associated compressive atelectasis. 3. Scattered  bilateral  ground-glass pulmonary opacities, greatest within the right middle lobe, may be secondary to infection versus pulmonary edema. 4. Large stool burden. Rectosigmoid diverticulosis without evidence for acute inflammation. 5. Soft tissue swelling about the right lower extremity is likely related to the patient's recent fracture and surgical intervention. 6. Again noted is biliary ductal dilatation. Correlation with laboratory studies and nonemergent MRCP is recommended. Aortic Atherosclerosis (ICD10-I70.0). Electronically Signed   By: Constance Holster M.D.   On: 05/15/2019 21:21        Scheduled Meds: . sodium chloride   Intravenous Once  . ALPRAZolam  0.5 mg Oral QHS  . Chlorhexidine Gluconate Cloth  6 each Topical Daily  . cholecalciferol  2,000 Units Oral BID  . [START ON 05/18/2019] enoxaparin (LOVENOX) injection  40 mg Subcutaneous Q24H  . levothyroxine  100 mcg Oral Q0600  . polyvinyl alcohol  1 drop Both Eyes QID   Continuous Infusions: . methocarbamol (ROBAXIN) IV            Aline August, MD [facility] 05/17/2019, 8:04 AM

## 2019-05-18 LAB — BASIC METABOLIC PANEL
Anion gap: 14 (ref 5–15)
BUN: 14 mg/dL (ref 8–23)
CO2: 34 mmol/L — ABNORMAL HIGH (ref 22–32)
Calcium: 8.7 mg/dL — ABNORMAL LOW (ref 8.9–10.3)
Chloride: 89 mmol/L — ABNORMAL LOW (ref 98–111)
Creatinine, Ser: 0.58 mg/dL (ref 0.44–1.00)
GFR calc Af Amer: >60 mL/min (ref >60)
GFR calc non Af Amer: >60 mL/min (ref >60)
Glucose, Bld: 103 mg/dL — ABNORMAL HIGH (ref 70–99)
Potassium: 3.4 mmol/L — ABNORMAL LOW (ref 3.5–5.1)
Sodium: 137 mmol/L (ref 135–145)

## 2019-05-18 LAB — MAGNESIUM: Magnesium: 2 mg/dL (ref 1.7–2.4)

## 2019-05-18 MED ORDER — FUROSEMIDE 10 MG/ML IJ SOLN
60.0000 mg | Freq: Once | INTRAMUSCULAR | Status: AC
  Administered 2019-05-18: 60 mg via INTRAVENOUS
  Filled 2019-05-18: qty 6

## 2019-05-18 MED ORDER — POTASSIUM CHLORIDE CRYS ER 20 MEQ PO TBCR
40.0000 meq | EXTENDED_RELEASE_TABLET | Freq: Once | ORAL | Status: AC
  Administered 2019-05-18: 40 meq via ORAL
  Filled 2019-05-18: qty 2

## 2019-05-18 MED ORDER — HYDROCODONE-ACETAMINOPHEN 5-325 MG PO TABS: 1.0000 | ORAL_TABLET | Freq: Four times a day (QID) | ORAL | 0 refills | Status: DC | PRN

## 2019-05-18 NOTE — Plan of Care (Signed)
  Problem: Skin Integrity: Goal: Risk for impaired skin integrity will decrease Outcome: Progressing   Problem: Safety: Goal: Ability to remain free from injury will improve Outcome: Progressing   Problem: Elimination: Goal: Will not experience complications related to bowel motility Outcome: Progressing   Problem: Coping: Goal: Level of anxiety will decrease Outcome: Progressing   Problem: Nutrition: Goal: Adequate nutrition will be maintained Outcome: Progressing   Problem: Activity: Goal: Risk for activity intolerance will decrease Outcome: Progressing   Problem: Clinical Measurements: Goal: Ability to maintain clinical measurements within normal limits will improve Outcome: Progressing   Problem: Education: Goal: Knowledge of General Education information will improve Description: Including pain rating scale, medication(s)/side effects and non-pharmacologic comfort measures Outcome: Progressing

## 2019-05-18 NOTE — TOC Progression Note (Signed)
Transition of Care [facility]) - Progression Note    Patient Details  Name: Courtney Arnold MRN: [id number] Date of Birth: January 25, 1941  [facility] CM/SW Contact  Sharin Mons, RN Phone Number: [phone] 05/18/2019, 11:41 AM  Clinical Narrative:    Pt will transition to [facility]. Awaiting manuel PASRR. TOC team will continue to monitor and follow....   Expected Discharge Plan: Skilled Nursing Facility Barriers to Discharge: [place] [facility])  Expected Discharge Plan and Services Expected Discharge Plan: [place]     Expected Discharge Date: 05/18/19                    Social Determinants of Health (SDOH) Interventions    Readmission Risk Interventions No flowsheet data found.

## 2019-05-18 NOTE — Progress Notes (Signed)
Please be advised that the above-named patient has a primary diagnosis of dementia which supersedes any psychiatric diagnoses and will require a short-term nursing home stay - anticipated 30 days or less for rehabilitation and strengthening. The plan is for return home.  Whitman Hero RN,BSN,CM

## 2019-05-18 NOTE — Plan of Care (Signed)
  Problem: Health Behavior/Discharge Planning: Goal: Ability to manage health-related needs will improve Outcome: Progressing   Problem: Clinical Measurements: Goal: Respiratory complications will improve Outcome: Progressing   Problem: Pain Managment: Goal: General experience of comfort will improve Outcome: Progressing   

## 2019-05-18 NOTE — Progress Notes (Signed)
Patient ID: Courtney Arnold, female   DOB: Jun 23, 1940, 79 y.o.   MRN: [id number]  PROGRESS NOTE    Courtney Arnold  [id number] DOB: Oct 21, 1940 DOA: 05/10/2019 PCP: [name], MD   Brief Narrative:  79 year old female with history of unspecified atrial fibrillation, compression fracture T12 status post kyphoplasty, hypertension, anemia, hypothyroidism, anxiety presented with lower back pain and bilateral lower extremity weakness causing her to fall.  She was found to have right femoral shaft fracture for which she was admitted under orthopedic service and underwent IM nailing.  She was also found to have compression L2 lumbar vertebral fracture along with sacral lytic lesions.  Hospitalist, neurosurgery and oncology services were consulted.  Assessment & Plan:   Acute hypoxic respiratory failure -Currently still requiring 2 L oxygen via nasal cannula.  Might need oxygen upon discharge as well -Incentive spirometry -CT of the chest on 05/15/2019 showed small to moderate bilateral pleural effusions along with some groundglass opacities concerning for pulmonary edema versus infiltrate -Wean off oxygen as able.  Patient has received IV Lasix daily for the last 2 days.  Will give 1 more dose of IV Lasix today.  Outpatient follow-up with PCP.  Might need pulmonary evaluation as an outpatient.  Unspecified atrial fibrillation -Currently in sinus rhythm -Not on any medication for A. fib at home.  Closed right femur fracture -Management as per primary orthopedics team.  Status post IM nailing.  Pain management/wound care/activity as per orthopedics recommendations.  Sacral lytic lesions -Status post CT-guided sacral biopsy on 05/17/2019 by IR.  Outpatient follow-up with oncology with pathology.  Hypokalemia -Replace.  Hypomagnesemia -Improved.  L4 compression deformity - new but chronic appearing L4 compression deformity seen on MRI of lumbar spine.  No role for neurosurgical intervention  as per neurosurgery.  Outpatient follow-up  Hypothyroidism -Continue Synthroid  Anemia of chronic disease -Status post 3 units transfusion during this hospitalization.  Hemoglobin stable.  Transfuse if hemoglobin less than 7  Disposition: Discharge planning as per primary orthopedic team.  Oncology has cleared the patient for discharge.  Patient is medically cleared for discharge as well with outpatient follow-up with PCP/SNF provider.   Subjective: Patient seen and examined at bedside.  Poor historian.  Denies worsening shortness of breath, fever or vomiting.   Objective: Vitals:   05/17/19 1333 05/17/19 1415 05/17/19 1523 05/17/19 1921  BP: 124/63 130/62 138/60 113/65  Pulse: 99 100 (!) 101 (!) 106  Resp: 17 20 19 16   Temp: 98.4 F (36.9 C) 98.2 F (36.8 C) 98.3 F (36.8 C) 98.6 F (37 C)  TempSrc: Oral Oral Oral Oral  SpO2: 99% 98% 98% 99%  Weight:      Height:        Intake/Output Summary (Last 24 hours) at 05/18/2019 0758 Last data filed at 05/17/2019 2111 Gross per 24 hour  Intake 840 ml  Output 300 ml  Net 540 ml   Filed Weights   05/11/19 0902  Weight: 54.4 kg    Examination:  General exam: No distress.  Appears chronically ill.  Poor historian. Respiratory system: Bilateral decreased breath sounds at bases with some scattered crackles.   Cardiovascular system: S1-S2 heard, slightly guarded Gastrointestinal system: Abdomen is nondistended, soft and nontender.  Bowel sounds are heard  extremities: No clubbing.  Trace lower extremity edema present  Data Reviewed: I have personally reviewed following labs and imaging studies  CBC: Recent Labs  Lab 05/13/19 0407 05/14/19 0251 05/15/19 0357 05/16/19 0628 05/17/19 0454  WBC  3.6* 4.9 4.7 3.5* 3.7*  HGB 7.0* 9.5* 9.0* 9.2* 8.8*  HCT 22.5* 29.3* 28.3* 28.9* 27.3*  MCV 89.3 89.3 90.4 90.3 88.3  PLT 140* 166 190 195 [id number]   Basic Metabolic Panel: Recent Labs  Lab 05/13/19 0407 05/14/19 0251  05/15/19 0357 05/17/19 0454 05/18/19 0444  NA 137 138 138 134* 137  K 3.4* 4.1 3.9 2.9* 3.4*  CL 100 102 98 91* 89*  CO2 28 28 31 29  34*  GLUCOSE 85 94 100* 91 103*  BUN 14 12 12 10 14   CREATININE 0.60 0.58 0.54 0.52 0.58  CALCIUM 7.4* 7.8* 8.2* 8.2* 8.7*  MG 2.2 1.9 1.8 1.6* 2.0   GFR: Estimated Creatinine Clearance: 46.6 mL/min (by C-G formula based on SCr of 0.58 mg/dL). Liver Function Tests: Recent Labs  Lab 05/12/19 0602 05/13/19 0407 05/14/19 0251 05/15/19 0357  AST 14* 16 19 19   ALT 9 9 9 9   ALKPHOS 47 56 71 78  BILITOT 0.4 0.5 1.1 0.9  PROT 4.3* 4.2* 4.6* 4.7*  ALBUMIN 2.3* 2.0* 2.2* 2.1*   No results for input(s): LIPASE, AMYLASE in the last 168 hours. No results for input(s): AMMONIA in the last 168 hours. Coagulation Profile: No results for input(s): INR, PROTIME in the last 168 hours. Cardiac Enzymes: No results for input(s): CKTOTAL, CKMB, CKMBINDEX, TROPONINI in the last 168 hours. BNP (last 3 results) No results for input(s): PROBNP in the last 8760 hours. HbA1C: No results for input(s): HGBA1C in the last 72 hours. CBG: No results for input(s): GLUCAP in the last 168 hours. Lipid Profile: No results for input(s): CHOL, HDL, LDLCALC, TRIG, CHOLHDL, LDLDIRECT in the last 72 hours. Thyroid Function Tests: No results for input(s): TSH, T4TOTAL, FREET4, T3FREE, THYROIDAB in the last 72 hours. Anemia Panel: No results for input(s): VITAMINB12, FOLATE, FERRITIN, TIBC, IRON, RETICCTPCT in the last 72 hours. Sepsis Labs: No results for input(s): PROCALCITON, LATICACIDVEN in the last 168 hours.  Recent Results (from the past 240 hour(s))  Respiratory Panel by RT PCR (Flu A&B, Covid) - Nasopharyngeal Swab     Status: None   Collection Time: 05/10/19  3:50 PM   Specimen: Nasopharyngeal Swab  Result Value Ref Range Status   SARS Coronavirus 2 by RT PCR NEGATIVE NEGATIVE Final    Comment: (NOTE) SARS-CoV-2 target nucleic acids are NOT DETECTED. The  SARS-CoV-2 RNA is generally detectable in upper respiratoy specimens during the acute phase of infection. The lowest concentration of SARS-CoV-2 viral copies this assay can detect is 131 copies/mL. A negative result does not preclude SARS-Cov-2 infection and should not be used as the sole basis for treatment or other patient management decisions. A negative result may occur with  improper specimen collection/handling, submission of specimen other than nasopharyngeal swab, presence of viral mutation(s) within the areas targeted by this assay, and inadequate number of viral copies (<131 copies/mL). A negative result must be combined with clinical observations, patient history, and epidemiological information. The expected result is Negative. Fact Sheet for Patients:  PinkCheek.be Fact Sheet for Healthcare Providers:  GravelBags.it This test is not yet ap proved or cleared by the [place] FDA and  has been authorized for detection and/or diagnosis of SARS-CoV-2 by FDA under an Emergency Use Authorization (EUA). This EUA will remain  in effect (meaning this test can be used) for the duration of the COVID-19 declaration under Section 564(b)(1) of the Act, 21 U.S.C. section 360bbb-3(b)(1), unless the authorization is terminated or revoked sooner.  Influenza A by PCR NEGATIVE NEGATIVE Final   Influenza B by PCR NEGATIVE NEGATIVE Final    Comment: (NOTE) The Xpert Xpress SARS-CoV-2/FLU/RSV assay is intended as an aid in  the diagnosis of influenza from Nasopharyngeal swab specimens and  should not be used as a sole basis for treatment. Nasal washings and  aspirates are unacceptable for Xpert Xpress SARS-CoV-2/FLU/RSV  testing. Fact Sheet for Patients: PinkCheek.be Fact Sheet for Healthcare Providers: GravelBags.it This test is not yet approved or cleared by the [place] FDA and  has been authorized for detection and/or diagnosis of SARS-CoV-2 by  FDA under an Emergency Use Authorization (EUA). This EUA will remain  in effect (meaning this test can be used) for the duration of the  Covid-19 declaration under Section 564(b)(1) of the Act, 21  U.S.C. section 360bbb-3(b)(1), unless the authorization is  terminated or revoked. Performed at [facility], [place] [address], [place], [place] [address]   MRSA PCR Screening     Status: None   Collection Time: 05/10/19  9:57 PM   Specimen: Nasal Mucosa; Nasopharyngeal  Result Value Ref Range Status   MRSA by PCR NEGATIVE NEGATIVE Final    Comment:        The GeneXpert MRSA Assay (FDA approved for NASAL specimens only), is one component of a comprehensive MRSA colonization surveillance program. It is not intended to diagnose MRSA infection nor to guide or monitor treatment for MRSA infections. Performed at [facility], [place] [address] [place], [place] [address]   SARS CORONAVIRUS 2 (TAT 6-24 HRS) Nasopharyngeal Nasopharyngeal Swab     Status: None   Collection Time: 05/17/19  6:15 PM   Specimen: Nasopharyngeal Swab  Result Value Ref Range Status   SARS Coronavirus 2 NEGATIVE NEGATIVE Final    Comment: (NOTE) SARS-CoV-2 target nucleic acids are NOT DETECTED. The SARS-CoV-2 RNA is generally detectable in upper and lower respiratory specimens during the acute phase of infection. Negative results do not preclude SARS-CoV-2 infection, do not rule out co-infections with other pathogens, and should not be used as the sole basis for treatment or other patient management decisions. Negative results must be combined with clinical observations, patient history, and epidemiological information. The expected result is Negative. Fact Sheet for Patients: SugarRoll.be Fact Sheet for Healthcare Providers: https://www.woods-mathews.com/ This test is not yet  approved or cleared by the [place] FDA and  has been authorized for detection and/or diagnosis of SARS-CoV-2 by FDA under an Emergency Use Authorization (EUA). This EUA will remain  in effect (meaning this test can be used) for the duration of the COVID-19 declaration under Section 56 4(b)(1) of the Act, 21 U.S.C. section 360bbb-3(b)(1), unless the authorization is terminated or revoked sooner. Performed at [facility], [place] [address] [place], [place] [address]          Radiology Studies: NM Bone Scan Whole Body  Result Date: 05/16/2019 CLINICAL DATA:  Lytic bone lesions.  Initial staging. EXAM: NUCLEAR MEDICINE WHOLE BODY BONE SCAN TECHNIQUE: Whole body anterior and posterior images were obtained approximately 3 hours after intravenous injection of radiopharmaceutical. RADIOPHARMACEUTICALS:  21.9 mCi Technetium-30m MDP IV COMPARISON:  CT chest, abdomen and pelvis of 05/15/2019 and femoral x-rays from 05/11/2019 FINDINGS: Signs of uptake in the right distal femoral shaft along the site of previous at identified fracture transfixed by a femoral rod. Intense radiotracer activity noted over the left proximal tibia and at the T10 level in the spine. Signs of patchy  calvarial uptake. Normal urinary tract activity. Urinary bladder is cures much of the sacrum. Relative photopenia in the distal left femur. Just above the knee. Also with relative photopenia along the distal tibia on the left. Photopenia particularly about the right proximal humerus is suggested. IMPRESSION: 1. Areas of increased uptake at site of right femoral fracture, along the right femur following surgery and the in the T10 vertebral body, all areas of postprocedural change following fracture. 2. Areas of relative photopenia in the left femur, left tibia and right proximal humerus, consider large lytic foci in these locations. 3. Increased radiotracer uptake in the left tibia proximally away from the joint may also  represent another site of involvement. There are other questionable areas of photopenia scattered throughout the visualized skeleton. Consider skeletal survey for further assessment as lytic lesions are often photopenic and therefore less visible on bone scan. This will include calvarial assessment which would be helpful given areas of variable calvarial uptake. 4. There is some subtle uptake along the flanks bilaterally that may reflect changes of anasarca. Electronically Signed   By: Zetta Bills M.D.   On: 05/16/2019 17:46   CT BIOPSY  Result Date: 05/17/2019 INDICATION: No known primary, now with lytic sacral lesions. Please perform CT-guided biopsy for tissue diagnostic purposes. EXAM: CT-GUIDED BIOPSY OF LYTIC LESION WITHIN THE RIGHT SACRAL ALA MEDICATIONS: None ANESTHESIA/SEDATION: Fentanyl 25 mcg IV; Versed 0.5 mg IV Sedation Time: 14 Minutes; The patient was continuously monitored during the procedure by the interventional radiology nurse under my direct supervision. COMPLICATIONS: None immediate. PROCEDURE: Informed consent was obtained from the patient following an explanation of the procedure, risks, benefits and alternatives. The patient understands, agrees and consents for the procedure. All questions were addressed. A time out was performed prior to the initiation of the procedure. The patient was positioned prone and non-contrast localization CT was performed of the pelvis to demonstrate the approximately 3.2 x 3.0 cm lytic lesion within the right side of the sacral ala (image 31, series 2). The operative site was prepped and draped in the usual sterile fashion. Under sterile conditions and local anesthesia, a 22 gauge spinal needle was utilized for procedural planning. Next, an 11 gauge coaxial bone biopsy needle was advanced into the peripheral aspect of the lytic lesion within the right sacral ala. Needle positioning was confirmed with CT imaging. Initially, a bone biopsy was obtained with  the inner 13 gauge bone biopsy needle. Next, additional core needle biopsy was obtained with the outer 11 gauge bone biopsy needle. Finally, the 11 gauge bone biopsy needle was readvanced into the posterior aspect the lytic lesion within the right sacral ala and a final biopsy samples acquired with the 11 gauge bone biopsy needle. The needle was removed and superficial hemostasis was obtained with manual compression. A dressing was applied. The patient tolerated the procedure well without immediate post procedural complication. IMPRESSION: Successful CT guided biopsy of lytic lesion within the right sacral ala. Electronically Signed   By: Sandi Mariscal M.D.   On: 05/17/2019 15:23        Scheduled Meds: . sodium chloride   Intravenous Once  . ALPRAZolam  0.5 mg Oral QHS  . cholecalciferol  2,000 Units Oral BID  . enoxaparin (LOVENOX) injection  40 mg Subcutaneous Q24H  . levothyroxine  100 mcg Oral Q0600  . polyvinyl alcohol  1 drop Both Eyes QID   Continuous Infusions: . methocarbamol (ROBAXIN) IV  Courtney August, MD [facility] 05/18/2019, 7:58 AM

## 2019-05-19 DIAGNOSIS — J9601 Acute respiratory failure with hypoxia: Secondary | ICD-10-CM

## 2019-05-19 NOTE — Progress Notes (Signed)
Subjective: 8 Days Post-Op Procedure(s) (LRB): RETROGRADE INTRAMEDULLARY NAIL FEMORAL (Right) Patient reports pain as minimal.    Objective: Vital signs in last 24 hours: Temp:  [97.5 F (36.4 C)-98.5 F (36.9 C)] 98.5 F (36.9 C) (03/13 0738) Pulse Rate:  [73-104] 93 (03/13 0738) Resp:  [15-19] 17 (03/13 0738) BP: (100-112)/(45-81) 102/45 (03/13 0738) SpO2:  [86 %-98 %] 96 % (03/13 0738)  Intake/Output from previous day: 03/12 0701 - 03/13 0700 In: 120 [P.O.:120] Out: 900 [Urine:900] Intake/Output this shift: No intake/output data recorded.  Recent Labs    05/17/19 0454  HGB 8.8*   Recent Labs    05/17/19 0454  WBC 3.7*  RBC 3.09*  HCT 27.3*  PLT 227   Recent Labs    05/17/19 0454 05/18/19 0444  NA 134* 137  K 2.9* 3.4*  CL 91* 89*  CO2 29 34*  BUN 10 14  CREATININE 0.52 0.58  GLUCOSE 91 103*  CALCIUM 8.2* 8.7*   No results for input(s): LABPT, INR in the last 72 hours.  Neurovascular intact Sensation intact distally Intact pulses distally Dorsiflexion/Plantar flexion intact Incision: dressing C/D/I   Assessment/Plan: 8 Days Post-Op Procedure(s) (LRB): RETROGRADE INTRAMEDULLARY NAIL FEMORAL (Right) Up with therapy Discharge to SNF  Pain control norco dvt proph lovenox rx done ready for d/c today once approved   Courtney Arnold 05/19/2019, 7:54 AM

## 2019-05-19 NOTE — TOC Progression Note (Signed)
Transition of Care [facility]) - Progression Note    Patient Details  Name: Courtney Arnold MRN: [id number] Date of Birth: 1940-11-03  Transition of Care [facility]) CM/SW [place], LCSW Phone Number: 05/19/2019, 8:57 AM  Clinical Narrative:    CSW uploaded requested documents to [facility]; screening still pending review. Note, [facility] is closed over the weekend.    Expected Discharge Plan: Skilled Nursing Facility Barriers to Discharge: [name])  Expected Discharge Plan and Services Expected Discharge Plan: [place]         Expected Discharge Date: 05/19/19                                     Social Determinants of Health (SDOH) Interventions    Readmission Risk Interventions No flowsheet data found.

## 2019-05-19 NOTE — Progress Notes (Signed)
Patient ID: Courtney Arnold, female   DOB: October 26, 1940, 79 y.o.   MRN: [id number]  PROGRESS NOTE    Courtney Arnold  [id number] DOB: 1941/02/07 DOA: 05/10/2019 PCP: [name], MD   Brief Narrative:  80 year old female with history of unspecified atrial fibrillation, compression fracture T12 status post kyphoplasty, hypertension, anemia, hypothyroidism, anxiety presented with lower back pain and bilateral lower extremity weakness causing her to fall.  She was found to have right femoral shaft fracture for which she was admitted under orthopedic service and underwent IM nailing.  She was also found to have compression L2 lumbar vertebral fracture along with sacral lytic lesions.  Hospitalist, neurosurgery and oncology services were consulted.  Assessment & Plan:   Acute hypoxic respiratory failure -Currently still requiring 2 L oxygen via nasal cannula.  Might need oxygen upon discharge as well -Incentive spirometry -CT of the chest on 05/15/2019 showed small to moderate bilateral pleural effusions along with some groundglass opacities concerning for pulmonary edema versus infiltrate -Wean off oxygen as able.  Patient has received IV Lasix daily for the last 3 days.  Blood pressure on the lower side.  Will not give any more Lasix.  Outpatient follow-up with PCP.  Might need pulmonary evaluation as an outpatient.  Unspecified atrial fibrillation -Currently in sinus rhythm.  Intermittently tachycardic. -Not on any medication for A. fib at home.  Closed right femur fracture -Management as per primary orthopedics team.  Status post IM nailing.  Pain management/wound care/activity as per orthopedics recommendations.  Sacral lytic lesions -Status post CT-guided sacral biopsy on 05/17/2019 by IR.  Outpatient follow-up with oncology with pathology.  L4 compression deformity - new but chronic appearing L4 compression deformity seen on MRI of lumbar spine.  No role for neurosurgical intervention  as per neurosurgery.  Outpatient follow-up  Hypothyroidism -Continue Synthroid  Anemia of chronic disease -Status post 3 units transfusion during this hospitalization.  Hemoglobin stable.  Transfuse if hemoglobin less than 7  Disposition: Patient is waiting for discharge to SNF as per primary orthopedics team.  Patient is medically cleared for discharge as well with outpatient follow-up with PCP/SNF provider.   Subjective: Patient seen and examined at bedside.  Poor historian.  No overnight fever, vomiting, worsening shortness of breath reported.    Objective: Vitals:   05/18/19 1946 05/19/19 0338 05/19/19 0738 05/19/19 1007  BP: (!) 106/58 100/81 (!) 102/45 126/67  Pulse: (!) 104 73 93 (!) 108  Resp: 16 15 17 18   Temp: 98.2 F (36.8 C) (!) 97.5 F (36.4 C) 98.5 F (36.9 C) 98.3 F (36.8 C)  TempSrc: Oral Oral Oral Oral  SpO2: 94% 98% 96% 94%  Weight:    54.4 kg  Height:    5\' 2"  (1.575 m)    Intake/Output Summary (Last 24 hours) at 05/19/2019 1029 Last data filed at 05/18/2019 2300 Gross per 24 hour  Intake 120 ml  Output 600 ml  Net -480 ml   Filed Weights   05/11/19 0902 05/19/19 1007  Weight: 54.4 kg 54.4 kg    Examination:  General exam: No acute distress.  Appears chronically ill.  Poor historian. Respiratory system: Bilateral decreased breath sounds at bases with some crackles.  No wheezing  cardiovascular system: Currently rate controlled, S1-S2 heard Gastrointestinal system: Abdomen is nondistended, soft and nontender.  Normal bowel sounds heard.   Extremities: No clubbing.  Left lower extremity very minimal edema present  Data Reviewed: I have personally reviewed following labs and imaging studies  CBC: Recent Labs  Lab 05/13/19 0407 05/14/19 0251 05/15/19 0357 05/16/19 0628 05/17/19 0454  WBC 3.6* 4.9 4.7 3.5* 3.7*  HGB 7.0* 9.5* 9.0* 9.2* 8.8*  HCT 22.5* 29.3* 28.3* 28.9* 27.3*  MCV 89.3 89.3 90.4 90.3 88.3  PLT 140* 166 190 195 [id number]   Basic  Metabolic Panel: Recent Labs  Lab 05/13/19 0407 05/14/19 0251 05/15/19 0357 05/17/19 0454 05/18/19 0444  NA 137 138 138 134* 137  K 3.4* 4.1 3.9 2.9* 3.4*  CL 100 102 98 91* 89*  CO2 28 28 31 29  34*  GLUCOSE 85 94 100* 91 103*  BUN 14 12 12 10 14   CREATININE 0.60 0.58 0.54 0.52 0.58  CALCIUM 7.4* 7.8* 8.2* 8.2* 8.7*  MG 2.2 1.9 1.8 1.6* 2.0   GFR: Estimated Creatinine Clearance: 46.6 mL/min (by C-G formula based on SCr of 0.58 mg/dL). Liver Function Tests: Recent Labs  Lab 05/13/19 0407 05/14/19 0251 05/15/19 0357  AST 16 19 19   ALT 9 9 9   ALKPHOS 56 71 78  BILITOT 0.5 1.1 0.9  PROT 4.2* 4.6* 4.7*  ALBUMIN 2.0* 2.2* 2.1*   No results for input(s): LIPASE, AMYLASE in the last 168 hours. No results for input(s): AMMONIA in the last 168 hours. Coagulation Profile: No results for input(s): INR, PROTIME in the last 168 hours. Cardiac Enzymes: No results for input(s): CKTOTAL, CKMB, CKMBINDEX, TROPONINI in the last 168 hours. BNP (last 3 results) No results for input(s): PROBNP in the last 8760 hours. HbA1C: No results for input(s): HGBA1C in the last 72 hours. CBG: No results for input(s): GLUCAP in the last 168 hours. Lipid Profile: No results for input(s): CHOL, HDL, LDLCALC, TRIG, CHOLHDL, LDLDIRECT in the last 72 hours. Thyroid Function Tests: No results for input(s): TSH, T4TOTAL, FREET4, T3FREE, THYROIDAB in the last 72 hours. Anemia Panel: No results for input(s): VITAMINB12, FOLATE, FERRITIN, TIBC, IRON, RETICCTPCT in the last 72 hours. Sepsis Labs: No results for input(s): PROCALCITON, LATICACIDVEN in the last 168 hours.  Recent Results (from the past 240 hour(s))  Respiratory Panel by RT PCR (Flu A&B, Covid) - Nasopharyngeal Swab     Status: None   Collection Time: 05/10/19  3:50 PM   Specimen: Nasopharyngeal Swab  Result Value Ref Range Status   SARS Coronavirus 2 by RT PCR NEGATIVE NEGATIVE Final    Comment: (NOTE) SARS-CoV-2 target nucleic acids  are NOT DETECTED. The SARS-CoV-2 RNA is generally detectable in upper respiratoy specimens during the acute phase of infection. The lowest concentration of SARS-CoV-2 viral copies this assay can detect is 131 copies/mL. A negative result does not preclude SARS-Cov-2 infection and should not be used as the sole basis for treatment or other patient management decisions. A negative result may occur with  improper specimen collection/handling, submission of specimen other than nasopharyngeal swab, presence of viral mutation(s) within the areas targeted by this assay, and inadequate number of viral copies (<131 copies/mL). A negative result must be combined with clinical observations, patient history, and epidemiological information. The expected result is Negative. Fact Sheet for Patients:  PinkCheek.be Fact Sheet for Healthcare Providers:  GravelBags.it This test is not yet ap proved or cleared by the [place] FDA and  has been authorized for detection and/or diagnosis of SARS-CoV-2 by FDA under an Emergency Use Authorization (EUA). This EUA will remain  in effect (meaning this test can be used) for the duration of the COVID-19 declaration under Section 564(b)(1) of the Act, 21 U.S.C. section 360bbb-3(b)(1), unless the  authorization is terminated or revoked sooner.    Influenza A by PCR NEGATIVE NEGATIVE Final   Influenza B by PCR NEGATIVE NEGATIVE Final    Comment: (NOTE) The Xpert Xpress SARS-CoV-2/FLU/RSV assay is intended as an aid in  the diagnosis of influenza from Nasopharyngeal swab specimens and  should not be used as a sole basis for treatment. Nasal washings and  aspirates are unacceptable for Xpert Xpress SARS-CoV-2/FLU/RSV  testing. Fact Sheet for Patients: PinkCheek.be Fact Sheet for Healthcare Providers: GravelBags.it This test is not yet approved or  cleared by the [place] FDA and  has been authorized for detection and/or diagnosis of SARS-CoV-2 by  FDA under an Emergency Use Authorization (EUA). This EUA will remain  in effect (meaning this test can be used) for the duration of the  Covid-19 declaration under Section 564(b)(1) of the Act, 21  U.S.C. section 360bbb-3(b)(1), unless the authorization is  terminated or revoked. Performed at [facility], [place] [address], [place], [place] [address]   MRSA PCR Screening     Status: None   Collection Time: 05/10/19  9:57 PM   Specimen: Nasal Mucosa; Nasopharyngeal  Result Value Ref Range Status   MRSA by PCR NEGATIVE NEGATIVE Final    Comment:        The GeneXpert MRSA Assay (FDA approved for NASAL specimens only), is one component of a comprehensive MRSA colonization surveillance program. It is not intended to diagnose MRSA infection nor to guide or monitor treatment for MRSA infections. Performed at [facility], [place] [address] [place], [place] [address]   SARS CORONAVIRUS 2 (TAT 6-24 HRS) Nasopharyngeal Nasopharyngeal Swab     Status: None   Collection Time: 06-11-19  6:15 PM   Specimen: Nasopharyngeal Swab  Result Value Ref Range Status   SARS Coronavirus 2 NEGATIVE NEGATIVE Final    Comment: (NOTE) SARS-CoV-2 target nucleic acids are NOT DETECTED. The SARS-CoV-2 RNA is generally detectable in upper and lower respiratory specimens during the acute phase of infection. Negative results do not preclude SARS-CoV-2 infection, do not rule out co-infections with other pathogens, and should not be used as the sole basis for treatment or other patient management decisions. Negative results must be combined with clinical observations, patient history, and epidemiological information. The expected result is Negative. Fact Sheet for Patients: SugarRoll.be Fact Sheet for Healthcare  Providers: https://www.woods-mathews.com/ This test is not yet approved or cleared by the [place] FDA and  has been authorized for detection and/or diagnosis of SARS-CoV-2 by FDA under an Emergency Use Authorization (EUA). This EUA will remain  in effect (meaning this test can be used) for the duration of the COVID-19 declaration under Section 56 4(b)(1) of the Act, 21 U.S.C. section 360bbb-3(b)(1), unless the authorization is terminated or revoked sooner. Performed at [facility], [place] [address] [place], [place] [address]          Radiology Studies: CT BIOPSY  Result Date: 06-11-2019 INDICATION: No known primary, now with lytic sacral lesions. Please perform CT-guided biopsy for tissue diagnostic purposes. EXAM: CT-GUIDED BIOPSY OF LYTIC LESION WITHIN THE RIGHT SACRAL ALA MEDICATIONS: None ANESTHESIA/SEDATION: Fentanyl 25 mcg IV; Versed 0.5 mg IV Sedation Time: 14 Minutes; The patient was continuously monitored during the procedure by the interventional radiology nurse under my direct supervision. COMPLICATIONS: None immediate. PROCEDURE: Informed consent was obtained from the patient following an explanation of the procedure, risks, benefits and alternatives. The patient understands, agrees and consents for the procedure. All  questions were addressed. A time out was performed prior to the initiation of the procedure. The patient was positioned prone and non-contrast localization CT was performed of the pelvis to demonstrate the approximately 3.2 x 3.0 cm lytic lesion within the right side of the sacral ala (image 31, series 2). The operative site was prepped and draped in the usual sterile fashion. Under sterile conditions and local anesthesia, a 22 gauge spinal needle was utilized for procedural planning. Next, an 11 gauge coaxial bone biopsy needle was advanced into the peripheral aspect of the lytic lesion within the right sacral ala. Needle positioning was  confirmed with CT imaging. Initially, a bone biopsy was obtained with the inner 13 gauge bone biopsy needle. Next, additional core needle biopsy was obtained with the outer 11 gauge bone biopsy needle. Finally, the 11 gauge bone biopsy needle was readvanced into the posterior aspect the lytic lesion within the right sacral ala and a final biopsy samples acquired with the 11 gauge bone biopsy needle. The needle was removed and superficial hemostasis was obtained with manual compression. A dressing was applied. The patient tolerated the procedure well without immediate post procedural complication. IMPRESSION: Successful CT guided biopsy of lytic lesion within the right sacral ala. Electronically Signed   By: Sandi Mariscal M.D.   On: 05/17/2019 15:23        Scheduled Meds: . sodium chloride   Intravenous Once  . ALPRAZolam  0.5 mg Oral QHS  . cholecalciferol  2,000 Units Oral BID  . enoxaparin (LOVENOX) injection  40 mg Subcutaneous Q24H  . levothyroxine  100 mcg Oral Q0600  . polyvinyl alcohol  1 drop Both Eyes QID   Continuous Infusions: . methocarbamol (ROBAXIN) IV            Aline August, MD [facility] 05/19/2019, 10:29 AM

## 2019-05-19 NOTE — Progress Notes (Signed)
  Progress Note   79 year old caucasian Female dx.  Right femur spiral Fx.  Hx: Afib, HTN, Anemia, Anxiety, and Hyperthyroidism .  Client is AOx4, all medication given as prescribed no adverse reactions observed.  Client V/S WNL, client denies pain . Client resting no signs and symptoms  of distress noted  Daughter at bedside.        Courtney Arnold, female, 79 y.o., DOB March 01, 1941, MRN [id number],                         [place]  [place]  [phone]

## 2019-05-19 NOTE — Plan of Care (Addendum)
Pt discharge orders placed this AM to SNF, will call report when LCSW lists phone number to call. Discharge instructions explained to pt and pt verbalized understanding. Packed all personal belongings. No further questions or concerns voiced. PASRR number pending. Will continue to monitor.   MEWS triggered to yellow at 1340 d/t student's vitals. Rechecked and mews returned back to green upon nurse's assessment.   Problem: Education: Goal: Knowledge of General Education information will improve Description: Including pain rating scale, medication(s)/side effects and non-pharmacologic comfort measures Outcome: Completed/Met   Problem: Health Behavior/Discharge Planning: Goal: Ability to manage health-related needs will improve Outcome: Completed/Met   Problem: Clinical Measurements: Goal: Ability to maintain clinical measurements within normal limits will improve Outcome: Completed/Met Goal: Will remain free from infection Outcome: Completed/Met Goal: Diagnostic test results will improve Outcome: Completed/Met Goal: Respiratory complications will improve Outcome: Completed/Met Goal: Cardiovascular complication will be avoided Outcome: Completed/Met   Problem: Activity: Goal: Risk for activity intolerance will decrease Outcome: Completed/Met   Problem: Nutrition: Goal: Adequate nutrition will be maintained Outcome: Completed/Met   Problem: Coping: Goal: Level of anxiety will decrease Outcome: Completed/Met   Problem: Elimination: Goal: Will not experience complications related to bowel motility Outcome: Completed/Met Goal: Will not experience complications related to urinary retention Outcome: Completed/Met   Problem: Pain Managment: Goal: General experience of comfort will improve Outcome: Completed/Met   Problem: Safety: Goal: Ability to remain free from injury will improve Outcome: Completed/Met   Problem: Skin Integrity: Goal: Risk for impaired skin integrity will  decrease Outcome: Completed/Met

## 2019-05-20 NOTE — Plan of Care (Signed)
  Problem: Activity: Goal: Ability to ambulate and perform ADLs will improve Outcome: Progressing   Problem: Self-Concept: Goal: Ability to maintain and perform role responsibilities to the fullest extent possible will improve Outcome: Progressing   Problem: Pain Management: Goal: Pain level will decrease Outcome: Progressing   

## 2019-05-20 NOTE — Progress Notes (Signed)
Subjective: 9 Days Post-Op Procedure(s) (LRB): RETROGRADE INTRAMEDULLARY NAIL FEMORAL (Right) Patient reports pain as mild.   Patient ready for d/c but per SW note awaiting PASRR review and they are closed on the weekend.  Objective: Vital signs in last 24 hours: Temp:  [97.8 F (36.6 C)-98.6 F (37 C)] 98.2 F (36.8 C) (03/14 0738) Pulse Rate:  [85-102] 88 (03/14 0738) Resp:  [14-18] 16 (03/14 0738) BP: (100-126)/(45-69) 126/55 (03/14 0738) SpO2:  [94 %-100 %] 100 % (03/14 0738)  Intake/Output from previous day: No intake/output data recorded. Intake/Output this shift: Total I/O In: 120 [P.O.:120] Out: -   No results for input(s): HGB in the last 72 hours. No results for input(s): WBC, RBC, HCT, PLT in the last 72 hours. Recent Labs    05/18/19 0444  NA 137  K 3.4*  CL 89*  CO2 34*  BUN 14  CREATININE 0.58  GLUCOSE 103*  CALCIUM 8.7*   No results for input(s): LABPT, INR in the last 72 hours.  RLE NVI SILT  Intact DF/PF ankle   Assessment/Plan: 9 Days Post-Op Procedure(s) (LRB): RETROGRADE INTRAMEDULLARY NAIL FEMORAL (Right) Up with therapy Plan for discharge tomorrow  Hopeful d/c tomorrow pending PASRR review. Pain control nroco dvt proph lovenox wbat RLE   Chriss Czar 05/20/2019, 10:44 AM

## 2019-05-20 NOTE — Plan of Care (Signed)
  Problem: Education: Goal: Verbalization of understanding the information provided (i.e., activity precautions, restrictions, etc) will improve Outcome: Progressing   Problem: Activity: Goal: Ability to ambulate and perform ADLs will improve Outcome: Progressing   Problem: Self-Concept: Goal: Ability to maintain and perform role responsibilities to the fullest extent possible will improve Outcome: Progressing   Problem: Pain Management: Goal: Pain level will decrease Outcome: Progressing   

## 2019-05-20 NOTE — Plan of Care (Signed)
  Problem: Education: Goal: Verbalization of understanding the information provided (i.e., activity precautions, restrictions, etc) will improve Outcome: Progressing   Problem: Self-Concept: Goal: Ability to maintain and perform role responsibilities to the fullest extent possible will improve Outcome: Progressing   Problem: Education: Goal: Verbalization of understanding the information provided (i.e., activity precautions, restrictions, etc) will improve Outcome: Progressing   Problem: Pain Management: Goal: Pain level will decrease Outcome: Progressing

## 2019-05-20 NOTE — Plan of Care (Signed)
PASRR number still pending, postponing discharge til then.   Problem: Education: Goal: Verbalization of understanding the information provided (i.e., activity precautions, restrictions, etc) will improve Outcome: Progressing   Problem: Activity: Goal: Ability to ambulate and perform ADLs will improve Outcome: Progressing   Problem: Clinical Measurements: Goal: Postoperative complications will be avoided or minimized Outcome: Progressing   Problem: Self-Concept: Goal: Ability to maintain and perform role responsibilities to the fullest extent possible will improve Outcome: Progressing   Problem: Pain Management: Goal: Pain level will decrease Outcome: Progressing

## 2019-05-21 ENCOUNTER — Encounter: Payer: Self-pay | Admitting: Adult Health

## 2019-05-21 ENCOUNTER — Telehealth: Payer: Self-pay

## 2019-05-21 ENCOUNTER — Inpatient Hospital Stay
Admission: RE | Admit: 2019-05-21 | Discharge: 2019-06-02 | Disposition: A | Discharge status: Home / Self Care | Payer: Medicare Other | Source: Ambulatory Visit | Attending: *Deleted | Admitting: *Deleted

## 2019-05-21 DIAGNOSIS — E890 Postprocedural hypothyroidism: Secondary | ICD-10-CM | POA: Diagnosis not present

## 2019-05-21 DIAGNOSIS — M2550 Pain in unspecified joint: Secondary | ICD-10-CM | POA: Diagnosis not present

## 2019-05-21 DIAGNOSIS — S32020D Wedge compression fracture of second lumbar vertebra, subsequent encounter for fracture with routine healing: Secondary | ICD-10-CM | POA: Diagnosis not present

## 2019-05-21 DIAGNOSIS — Z79899 Other long term (current) drug therapy: Secondary | ICD-10-CM | POA: Diagnosis not present

## 2019-05-21 DIAGNOSIS — M545 Low back pain: Secondary | ICD-10-CM | POA: Diagnosis not present

## 2019-05-21 DIAGNOSIS — Z7401 Bed confinement status: Secondary | ICD-10-CM | POA: Diagnosis not present

## 2019-05-21 DIAGNOSIS — F419 Anxiety disorder, unspecified: Secondary | ICD-10-CM | POA: Diagnosis not present

## 2019-05-21 DIAGNOSIS — Z803 Family history of malignant neoplasm of breast: Secondary | ICD-10-CM | POA: Diagnosis not present

## 2019-05-21 DIAGNOSIS — Z720 Tobacco use: Secondary | ICD-10-CM | POA: Diagnosis not present

## 2019-05-21 DIAGNOSIS — S72341D Displaced spiral fracture of shaft of right femur, subsequent encounter for closed fracture with routine healing: Secondary | ICD-10-CM | POA: Diagnosis not present

## 2019-05-21 DIAGNOSIS — Z87891 Personal history of nicotine dependence: Secondary | ICD-10-CM | POA: Diagnosis not present

## 2019-05-21 DIAGNOSIS — S79929A Unspecified injury of unspecified thigh, initial encounter: Secondary | ICD-10-CM | POA: Diagnosis not present

## 2019-05-21 DIAGNOSIS — D649 Anemia, unspecified: Secondary | ICD-10-CM | POA: Diagnosis not present

## 2019-05-21 DIAGNOSIS — F411 Generalized anxiety disorder: Secondary | ICD-10-CM | POA: Diagnosis not present

## 2019-05-21 DIAGNOSIS — E538 Deficiency of other specified B group vitamins: Secondary | ICD-10-CM | POA: Diagnosis not present

## 2019-05-21 DIAGNOSIS — M4856XD Collapsed vertebra, not elsewhere classified, lumbar region, subsequent encounter for fracture with routine healing: Secondary | ICD-10-CM | POA: Diagnosis not present

## 2019-05-21 DIAGNOSIS — Z741 Need for assistance with personal care: Secondary | ICD-10-CM | POA: Diagnosis not present

## 2019-05-21 DIAGNOSIS — M898X8 Other specified disorders of bone, other site: Secondary | ICD-10-CM | POA: Diagnosis not present

## 2019-05-21 DIAGNOSIS — C9000 Multiple myeloma not having achieved remission: Secondary | ICD-10-CM | POA: Diagnosis not present

## 2019-05-21 DIAGNOSIS — E876 Hypokalemia: Secondary | ICD-10-CM | POA: Diagnosis not present

## 2019-05-21 DIAGNOSIS — R262 Difficulty in walking, not elsewhere classified: Secondary | ICD-10-CM | POA: Diagnosis not present

## 2019-05-21 DIAGNOSIS — M899 Disorder of bone, unspecified: Secondary | ICD-10-CM | POA: Diagnosis not present

## 2019-05-21 DIAGNOSIS — R0902 Hypoxemia: Secondary | ICD-10-CM | POA: Diagnosis not present

## 2019-05-21 DIAGNOSIS — E559 Vitamin D deficiency, unspecified: Secondary | ICD-10-CM | POA: Diagnosis not present

## 2019-05-21 DIAGNOSIS — I959 Hypotension, unspecified: Secondary | ICD-10-CM | POA: Diagnosis not present

## 2019-05-21 DIAGNOSIS — J9601 Acute respiratory failure with hypoxia: Secondary | ICD-10-CM | POA: Diagnosis not present

## 2019-05-21 DIAGNOSIS — Z7901 Long term (current) use of anticoagulants: Secondary | ICD-10-CM | POA: Diagnosis not present

## 2019-05-21 DIAGNOSIS — E039 Hypothyroidism, unspecified: Secondary | ICD-10-CM | POA: Diagnosis not present

## 2019-05-21 DIAGNOSIS — I4891 Unspecified atrial fibrillation: Secondary | ICD-10-CM | POA: Diagnosis not present

## 2019-05-21 DIAGNOSIS — M6281 Muscle weakness (generalized): Secondary | ICD-10-CM | POA: Diagnosis not present

## 2019-05-21 DIAGNOSIS — I10 Essential (primary) hypertension: Secondary | ICD-10-CM | POA: Diagnosis not present

## 2019-05-21 DIAGNOSIS — Z9181 History of falling: Secondary | ICD-10-CM | POA: Diagnosis not present

## 2019-05-21 DIAGNOSIS — S72001D Fracture of unspecified part of neck of right femur, subsequent encounter for closed fracture with routine healing: Secondary | ICD-10-CM | POA: Diagnosis not present

## 2019-05-21 LAB — SARS CORONAVIRUS 2 (TAT 6-24 HRS): SARS Coronavirus 2: NEGATIVE

## 2019-05-21 NOTE — Progress Notes (Signed)
Report given to [place] at [facility].  The patient is awaiting for PTAR.  A discharge packet has been printed and will be sent with the patient.

## 2019-05-21 NOTE — Plan of Care (Signed)

## 2019-05-21 NOTE — TOC Progression Note (Signed)
Transition of Care [facility]) - Progression Note    Patient Details  Name: Courtney Arnold MRN: [id number] Date of Birth: 10-23-1940  [facility]) CM/SW Contact  Sharin Mons, RN Phone Number: [phone] 05/21/2019, 8:41 AM  Clinical Narrative:     Still awaiting PASRR , under review. Updated COVID needed, MD and nurse made aware. NCM will continue to monitor.  Expected Discharge Plan: Skilled Nursing Facility Barriers to Discharge: [facility])  Expected Discharge Plan and Services Expected Discharge Plan: [place]         Expected Discharge Date: 05/19/19                                     Social Determinants of Health (SDOH) Interventions    Readmission Risk Interventions No flowsheet data found.

## 2019-05-21 NOTE — TOC Transition Note (Signed)
Transition of Care [facility]) - CM/SW Discharge Note   Patient Details  Name: JACQUA STEERE MRN: [id number] Date of Birth: 1940/07/14  Transition of Care [facility]) CM/SW Contact:  Sharin Mons, RN Phone Number: 05/21/2019, 2:31 PM   Clinical Narrative:    Patient will DC to: [facility] Anticipated DC date: 05/21/2019 Family notified: Vivien Rota (daughter) Transport by: Corey Harold   Per MD patient ready for DC to . RN, patient, patient's family, and facility notified of DC. Discharge Summary and FL2 sent to facility. RN to call report prior to discharge [phone]). DC packet on chart. Ambulance transport requested for patient.   RNCM will sign off for now as intervention is no longer needed. Please consult [place] again if new needs arise.    Final next level of care: Skilled Nursing Facility([facility]) Barriers to Discharge: No Barriers Identified   Patient Goals and CMS Choice Patient states their goals for this hospitalization and ongoing recovery are:: To be able to go home CMS Medicare.gov Compare Post Acute Care list provided to:: Patient Choice offered to / list presented to : Patient  Discharge Placement                       Discharge Plan and Services                                     Social Determinants of Health (SDOH) Interventions     Readmission Risk Interventions No flowsheet data found.

## 2019-05-21 NOTE — Progress Notes (Signed)
Orthopaedic Trauma Progress Note  S: Doing okay today, no new complaints. Ready to be discharged.   O:  Vitals:   05/21/19 0424 05/21/19 0743  BP: (!) 119/58 (!) 116/51  Pulse: 79 82  Resp: 15 18  Temp: (!) 97.4 F (36.3 C) 98.2 F (36.8 C)  SpO2: 99% 98%    General - Laying in bed, sleepy. NAD Respiratory - No increased work of breathing.  Right Lower Extremity - Incisions clean, dry, intact.  Steri-Strips in place but starting to peel off.  Moderate ecchymosis through the hip and thigh. Moderate swelling throughout extremity. No significant tenderness with palpation throughout extremity.  Ankle dorsiflexion/plantarflexion is intact.  Able to wiggle each of her toes. Sensation intact to light touch distally. Compartments are soft and compressible. Foot is cool but equal to contralateral side. +DP pulse  Imaging: Stable post op femur imaging.   Labs:  No results found for this or any previous visit (from the past 24 hour(s)).  Assessment: 79 year old female status post fall, 10 Days Post-Op   Injuries: Right femoral shaft fracture status post retrograde IM nail  Weightbearing: WBAT RLE  Insicional and dressing care: Incisions can be left open to air  Showering: Okay to begin showering and getting incisions wet.  Orthopedic device(s): None   CV/Blood loss: Acute blood loss anemia, Hgb stable.   Pain management:  1. Robaxin 500 mg q 6 hours PRN 2. Norco 5-325 q 4 hours PRN 3. Morphine 1-2 mg q 2 hours PRN  VTE prophylaxis:  Lovenox, SCDs  ID:  Ancef 2gm post op completed  Foley/Lines: No foley.  KVO IVFs  Medical co-morbidities: atrial fibrillation, compression fracture T12 s/p kyphoplasty, HTN, Anemia, Hypothyroidism, Anxiety  Impediments to Fracture Healing: Vitamin D level 22, continue D3 supplementation  Dispo: Up with therapies as tolerated.PASRR still under review for SNF placement. Hoping for d/c later today. Updated Covid test ordered.   Follow - up plan: 2  weeks for wound check and repeat x-rays  Contact information:  Katha Hamming MD, Patrecia Pace PA-C   Kengo Sturges A[facility] [phone] (office) [email or web address]

## 2019-05-21 NOTE — Progress Notes (Signed)
PT Cancellation Note  Patient Details Name: Courtney Arnold MRN: [id number] DOB: 1940/12/25   Cancelled Treatment:    Reason Eval/Treat Not Completed: Patient declined, no reason specified Pt reports that she does not wish to work with therapy today because she is about to leave whenever the ambulance gets here to SNF. She reports she does not wish to work today because she is waiting for the ambulance and has been frustrated about not being able to get out of bed & recliner multiple times a day and her pain has gotten worse. Educated on importance of further mobilization to move more and states that she does not wish to work with therapy today.   Ann Held PT, DPT Acute Rehab [facility] Rehabilitation P: [phone]   Renato Gails 05/21/2019, 3:13 PM

## 2019-05-21 NOTE — Progress Notes (Signed)
This encounter was created in error - please disregard.

## 2019-05-22 ENCOUNTER — Other Ambulatory Visit: Payer: Self-pay | Admitting: Adult Health

## 2019-05-22 ENCOUNTER — Telehealth (HOSPITAL_COMMUNITY): Payer: Self-pay | Admitting: *Deleted

## 2019-05-22 ENCOUNTER — Encounter: Payer: Self-pay | Admitting: Adult Health

## 2019-05-22 ENCOUNTER — Other Ambulatory Visit: Payer: Self-pay | Admitting: *Deleted

## 2019-05-22 ENCOUNTER — Non-Acute Institutional Stay (SKILLED_NURSING_FACILITY): Payer: Medicare Other | Admitting: Adult Health

## 2019-05-22 DIAGNOSIS — I4891 Unspecified atrial fibrillation: Secondary | ICD-10-CM

## 2019-05-22 DIAGNOSIS — E559 Vitamin D deficiency, unspecified: Secondary | ICD-10-CM

## 2019-05-22 DIAGNOSIS — S72001D Fracture of unspecified part of neck of right femur, subsequent encounter for closed fracture with routine healing: Secondary | ICD-10-CM

## 2019-05-22 DIAGNOSIS — M899 Disorder of bone, unspecified: Secondary | ICD-10-CM | POA: Diagnosis not present

## 2019-05-22 DIAGNOSIS — F419 Anxiety disorder, unspecified: Secondary | ICD-10-CM | POA: Diagnosis not present

## 2019-05-22 DIAGNOSIS — E538 Deficiency of other specified B group vitamins: Secondary | ICD-10-CM | POA: Diagnosis not present

## 2019-05-22 DIAGNOSIS — E890 Postprocedural hypothyroidism: Secondary | ICD-10-CM

## 2019-05-22 DIAGNOSIS — D649 Anemia, unspecified: Secondary | ICD-10-CM | POA: Diagnosis not present

## 2019-05-22 DIAGNOSIS — S32020D Wedge compression fracture of second lumbar vertebra, subsequent encounter for fracture with routine healing: Secondary | ICD-10-CM

## 2019-05-22 DIAGNOSIS — J9601 Acute respiratory failure with hypoxia: Secondary | ICD-10-CM | POA: Diagnosis not present

## 2019-05-22 MED ORDER — ALPRAZOLAM 0.5 MG PO TABS: 0.5000 mg | ORAL_TABLET | Freq: Every day | ORAL | 0 refills | Status: DC

## 2019-05-22 MED ORDER — HYDROCODONE-ACETAMINOPHEN 5-325 MG PO TABS: 1.0000 | ORAL_TABLET | Freq: Four times a day (QID) | ORAL | 0 refills | Status: DC | PRN

## 2019-05-22 NOTE — Patient Outreach (Signed)
Screened for potential [facility] Care Management needs as a benefit of  [employer].  Courtney Arnold is currently receiving skilled therapy at [facility].  Writer attended telephonic interdisciplinary team meeting to assess for disposition needs and transition plan for resident.   Will continue to follow for transition plans and for potential [facility] Care Management services.   Marthenia Rolling, MSN-Ed, RN,BSN [place] Acute Care Coordinator [phone] [facility]) [phone]  (Toll free office)

## 2019-05-22 NOTE — Telephone Encounter (Signed)
Ms Cabanilla's dughter called requesting bone scan and bone biopsy results.  Forwarded request to Dr. Burr Medico.

## 2019-05-22 NOTE — Progress Notes (Signed)
Location:    [place] Room Number: 157/W Place of Service:  SNF (31)   CODE STATUS: DNR  No Known Allergies  Chief Complaint  Patient presents with  . Hospitalization Follow-up    Hospitalization Follow Up    HPI:  She is a 79 year old woman who has been hospitalized from 05-11-19 through 05-21-19. She suffered a right femur fracture required a IM nail done. She was found to have L2 compression fracture. She was also found to sacral lytic lesion is status post biopsy 05-17-19. Her goal is to return home. She is here for short term rehab. She does have hip pain. There are no reports of shortness of breath no cough present. She will continue to be followed for her chronic illnesses including: afib; hypothyroidism; vit d def; vit b12 def.   Past Medical History:  Diagnosis Date  . Atrial fibrillation ([place]) 2011   Postop, spontaneous conversion to normal sinus after one hour  . Compression fracture 07/24/09   T12; kyphoplasty  . History of echocardiogram 5/11   EF 65%  . Hypertension   . Thyroid disease   . Tobacco abuse     Past Surgical History:  Procedure Laterality Date  . BACK SURGERY    . BACK SURGERY  06/06/2015  . BREAST EXCISIONAL BIOPSY Left    50 years ago  benign  . CHOLECYSTECTOMY N/A 04/25/2015   Procedure: LAPAROSCOPIC CHOLECYSTECTOMY WITH INTRAOPERATIVE CHOLANGIOGRAM;  Surgeon: Mickeal Skinner, MD;  [facility]: [facility];  Service: General;  Laterality: N/A;  . COLONOSCOPY N/A 11/26/2015   Procedure: COLONOSCOPY;  Surgeon: Daneil Dolin, MD;  [facility];  Service: Endoscopy;  Laterality: N/A;  7:30 am  . ERCP N/A 04/14/2015   Procedure: ENDOSCOPIC RETROGRADE CHOLANGIOPANCREATOGRAPHY (ERCP) Biliary Sphincterotomy, 10x7 stent placement Dilated bilary system just not well seen;  Surgeon: Rogene Houston, MD;  Location: [facility];  Service: Endoscopy;  Laterality: N/A;  . ERCP N/A 06/12/2015   Procedure: ENDOSCOPIC RETROGRADE  CHOLANGIOPANCREATOGRAPHY (ERCP);  Surgeon: Rogene Houston, MD;  [facility];  Service: Endoscopy;  Laterality: N/A;  . ESOPHAGOGASTRODUODENOSCOPY N/A 06/12/2015   Procedure: DIAGNOSTIC ESOPHAGOGASTRODUODENOSCOPY (EGD);  Surgeon: Rogene Houston, MD;  [facility];  Service: Endoscopy;  Laterality: N/A;  . FEMUR IM NAIL Right 05/11/2019   Procedure: RETROGRADE INTRAMEDULLARY NAIL FEMORAL;  Surgeon: Shona Needles, MD;  Location: [place];  Service: Orthopedics;  Laterality: Right;  . STENT REMOVAL  06/12/2015   Procedure: STENT REMOVAL ;  Surgeon: Rogene Houston, MD;  [facility];  Service: Endoscopy;;    Social History   Socioeconomic History  . Marital status: Widowed    Spouse name: Not on file  . Number of children: 2  . Years of education: Not on file  . Highest education level: Not on file  Occupational History  . Not on file  Tobacco Use  . Smoking status: Former Smoker    Packs/day: 0.15    Years: 20.00    Pack years: 3.00    Quit date: 03/08/2013    Years since quitting: 6.2  . Smokeless tobacco: Never Used  Substance and Sexual Activity  . Alcohol use: No    Alcohol/week: 0.0 standard drinks  . Drug use: No  . Sexual activity: Not Currently  Other Topics Concern  . Not on file  Social History Narrative   Active in gardens and does yard work.   Social Determinants  of Health   Financial Resource Strain: Low Risk   . Difficulty of Paying Living Expenses: Not hard at all  Food Insecurity: No Food Insecurity  . Worried About Charity fundraiser in the Last Year: Never true  . Ran Out of Food in the Last Year: Never true  Transportation Needs: No Transportation Needs  . Lack of Transportation (Medical): No  . Lack of Transportation (Non-Medical): No  Physical Activity: Inactive  . Days of Exercise per Week: 0 days  . Minutes of Exercise per Session: 0 min  Stress: No Stress Concern Present  . Feeling of Stress : Not at all  Social  Connections:   . Frequency of Communication with Friends and Family:   . Frequency of Social Gatherings with Friends and Family:   . Attends Religious Services:   . Active Member of Clubs or Organizations:   . Attends Archivist Meetings:   Marland Kitchen Marital Status:   Intimate Partner Violence: Not At Risk  . Fear of Current or Ex-Partner: No  . Emotionally Abused: No  . Physically Abused: No  . Sexually Abused: No   Family History  Problem Relation Age of Onset  . Heart attack Father   . Stroke Father   . Breast cancer Sister   . Thyroid disease Neg Hx   . Colon cancer Neg Hx       VITAL SIGNS BP 95/60   Pulse 83   Temp (!) 97.3 F (36.3 C) (Oral)   Resp 20   Ht 5\' 2"  (1.575 m)   Wt 144 lb 6.4 oz (65.5 kg)   SpO2 95%   BMI 26.41 kg/m   Outpatient Encounter Medications as of 05/22/2019  Medication Sig  . ALPRAZolam (XANAX) 0.5 MG tablet Take 0.5 mg by mouth at bedtime.   . carboxymethylcellulose (REFRESH PLUS) 0.5 % SOLN Place 1 drop into both eyes in the morning, at noon, in the evening, and at bedtime.  . Cholecalciferol (VITAMIN D) 125 MCG (5000 UT) CAPS Take 5,000 Units by mouth daily.  . cyclobenzaprine (FLEXERIL) 5 MG tablet Take 5 mg by mouth 3 (three) times daily as needed for muscle spasms.   Marland Kitchen enoxaparin (LOVENOX) 40 MG/0.4ML injection Inject 0.4 mLs (40 mg total) into the skin daily.  Marland Kitchen HYDROcodone-acetaminophen (NORCO/VICODIN) 5-325 MG tablet Take 1 tablet by mouth every 6 (six) hours as needed for severe pain.  Marland Kitchen levothyroxine (SYNTHROID) 100 MCG tablet Take 1 tablet (100 mcg total) by mouth daily.  Marland Kitchen lidocaine (LIDODERM) 5 % Place 1 patch onto the skin daily. Remove & Discard patch within 12 hours or as directed by MD  . NON FORMULARY Diet -Regular  . OXYGEN Inhale into the lungs. Oxygen @@ 2L/min via [place] to maintain sats >88%Special Instructions: Document O2 sat qshift. Every Shift Day, Evening, Night   No facility-administered encounter medications  on file as of 05/22/2019.     SIGNIFICANT DIAGNOSTIC EXAMS  TODAY  05-10-19: chest x-ray: New compression fracture of T10 since 06/11/2015. No acute cardiopulmonary abnormalities  05-10-19: right femur x-ray: Displaced spiral fracture of the distal femoral diaphysis with valgus angulation and external rotation of the lower fracture fragment.  05-10-19: right knee x-ray: Displaced distal femoral fracture better assessed on dedicated femur radiographs performed concurrently. Trace knee effusion.  No additional fractures.  05-10-19: ct of right femur:  1. Displaced spiral fracture of the distal third of the right femoral diaphysis with external rotation and valgus angulation of the distal fracture  fragment. No dislocation. 2. Small suprapatellar effusion. 3. Partially visualized lucent area in the lateral aspect of the right sacral bone adjacent to the SI joint. This may represent degenerative changes and osteopenia although a lytic lesion is not excluded. MRI may provide better evaluation on a nonemergent/outpatient basis.   05-14-19: MRI of lumbar spine:  New but chronic appearing compression deformity of L4. Multilevel degenerative changes with some progression since remote study of 2011. Stenosis is greatest at L3-L4.  05-15-19: MRI of pelvis:  1. Expansile lesions of the right upper sacral ala, left upper sacrum, and right posterior acetabular wall. The larger right sacral lesion abuts and displaces the right sacral plexus. Top differential diagnostic considerations include myeloma and metastatic disease. 2. No other pelvic masses are identified. 3. Nonspecific presacral edema, increased compared to 05/01/2019. 4. Sigmoid colon diverticulosis.   LABS REVIEWED:   05-08-19: vit B 12: 118 iron 31; TIBC 349 05-10-19 wbc 6.2; hgb 6.9; hct 21.9; mcv 88.3 plt 218; glucose 145; bun 26; creat 0.62; k+ 3.7; na++ 135; ca 8.5  tsh 3.046; guaiac neg 05-12-19: vit D 22.25; hgb 7.1; hct 22.3 05-13-19: wbc 3.6;  hgb 7.0; hct 22.5; mcv 89.3 plt 140; glucose 85; bun 14; creat 0.60 ;k+ 3.4; na++ 137; ca 7.4 liver normal albumin 2.0 05-17-19: wbc 3.7; hgb 8.8; hct 27.3 ;mcv 88.3 plt 227; glucose 91; bun 10; creat 0.52; k+ 2.9; na++ 134; ca 8.2; mag 1.6  05-18-19: glucose 103; bun 14; creat 0.58; k+ 3.4; na++ 137; ca 8.7    Review of Systems  Constitutional: Negative for malaise/fatigue.  Respiratory: Negative for cough and shortness of breath.   Cardiovascular: Negative for chest pain, palpitations and leg swelling.  Gastrointestinal: Negative for abdominal pain, constipation and heartburn.  Musculoskeletal: Positive for joint pain. Negative for back pain and myalgias.       Right hip pain  Skin: Negative.   Neurological: Negative for dizziness.  Psychiatric/Behavioral: The patient is not nervous/anxious.      Physical Exam Constitutional:      General: She is not in acute distress.    Appearance: She is well-developed. She is not diaphoretic.  Neck:     Thyroid: No thyromegaly.  Cardiovascular:     Rate and Rhythm: Normal rate and regular rhythm.     Pulses: Normal pulses.     Heart sounds: Normal heart sounds.  Pulmonary:     Effort: Pulmonary effort is normal. No respiratory distress.     Breath sounds: Normal breath sounds.     Comments: 02 dependent  Abdominal:     General: Bowel sounds are normal. There is no distension.     Palpations: Abdomen is soft.     Tenderness: There is no abdominal tenderness.  Musculoskeletal:     Cervical back: Neck supple.     Right lower leg: Edema present.     Comments: Is able to move all extremities  Is status right femur IM nail 05-11-19  Lymphadenopathy:     Cervical: No cervical adenopathy.  Skin:    General: Skin is warm and dry.  Neurological:     Mental Status: She is alert and oriented to person, place, and time.  Psychiatric:        Mood and Affect: Mood normal.       ASSESSMENT/ PLAN:  TODAY  1. Compression fracture of L2  vertebrae with routine healing subsequent encounter/ closed fracture of neck of right femur with routine healing subsequent encounter: is stable  will continue lovenox 40 mg daily flexeril 5 mg three times daily as needed; vicodin 5/35 mg every 6 hours as needed through 3-22-1. lidoderm patch to back   2. Bone lesion: is status post biopsy; will monitor   3. Hypothyroidism status post radioaiodine therapy: is stable tsh 3.046 will continue synthroid 100 mcg daily  4. Atrial fibrillation unspecified type: is stable will monitor her status.   5. Acute respiratory failure with hypoxia: is stable is on 02 will monitor   6. Vitamin D deficiency: level is 22.25 will continue Vit D 5,000 units daily   7. Vitamin B12: level is 118 will begin vitamin B12: 2000 mcg daily  8. Anemia unspecified type: is stable hgb 8.8 is status post transfusions  9. Chronic anxiety: is stable will continue xanax 0.5 mg nightly         MD is aware of resident's narcotic use and is in agreement with current plan of care. We will attempt to wean resident as appropriate.  Ok Edwards NP [facility]  Contact [phone] Monday through Friday 8am- 5pm  After hours call [phone]

## 2019-05-23 ENCOUNTER — Non-Acute Institutional Stay (SKILLED_NURSING_FACILITY): Payer: Medicare Other | Admitting: Internal Medicine

## 2019-05-23 ENCOUNTER — Encounter: Payer: Self-pay | Admitting: Internal Medicine

## 2019-05-23 DIAGNOSIS — E890 Postprocedural hypothyroidism: Secondary | ICD-10-CM | POA: Diagnosis not present

## 2019-05-23 DIAGNOSIS — S72001D Fracture of unspecified part of neck of right femur, subsequent encounter for closed fracture with routine healing: Secondary | ICD-10-CM

## 2019-05-23 DIAGNOSIS — F419 Anxiety disorder, unspecified: Secondary | ICD-10-CM | POA: Diagnosis not present

## 2019-05-23 DIAGNOSIS — S32020D Wedge compression fracture of second lumbar vertebra, subsequent encounter for fracture with routine healing: Secondary | ICD-10-CM | POA: Diagnosis not present

## 2019-05-23 LAB — SURGICAL PATHOLOGY

## 2019-05-23 NOTE — Progress Notes (Signed)
: Provider:  Hennie Duos., MD Location:  [address] Room Number: 157-W Place of Service:  SNF ([phone])  PCP: [name], MD Patient Care Team: [name], MD as PCP - General (Family Medicine) Courtney Arnold, Cristopher Estimable, MD as Consulting Physician (Gastroenterology) Donetta Potts, RN as Oncology Nurse Navigator (Oncology) Derek Jack, MD as Medical Oncologist (Oncology)  Extended Emergency Contact Information Primary Emergency Contact: [facility] Address: [address]          [place], [place] [address] Johnnette Litter of [place] Phone: [phone] Mobile Phone: [phone] Relation: Daughter Secondary Emergency Contact: Delford Field [place] of [place] Phone: [phone] Mobile Phone: [phone] Relation: Daughter     Allergies: Patient has no known allergies.  Chief Complaint  Patient presents with  . New Admit To SNF    New admission to [facility]    HPI: Patient is a 79 y.o. female who presented to [facility], ED after a fall at home in the emergency department she was found to have right femur fracture.  Patient was admitted to [facility] from 3/4-15 where she underwent a retrograde IM nailing on 3/5.  Patient received a total of 4 units PRBC throughout hospitalization.  Patient underwent a work-up for low back pain and bilateral lower extremity weakness while in the hospital.  Neurosurgery, interventional radiology and medical oncology were involved in patient's care.  Patient is admitted to SNF for OT/PT. Skilled nursing facility patient will be followed for hypothyroidism treated with Synthroid, back pain treated with lidocaine patch anxiety treated with Xanax.  Past Medical History:  Diagnosis Date  . Atrial fibrillation ([place]) 2011   Postop, spontaneous conversion to normal sinus after one hour  . Compression fracture 07/24/09   T12; kyphoplasty  . History of echocardiogram 5/11   EF 65%  .  Hypertension   . Thyroid disease   . Tobacco abuse     Past Surgical History:  Procedure Laterality Date  . BACK SURGERY    . BACK SURGERY  06/06/2015  . BREAST EXCISIONAL BIOPSY Left    50 years ago  benign  . CHOLECYSTECTOMY N/A 04/25/2015   Procedure: LAPAROSCOPIC CHOLECYSTECTOMY WITH INTRAOPERATIVE CHOLANGIOGRAM;  Surgeon: Mickeal Skinner, MD;  [facility];  Service: General;  Laterality: N/A;  . COLONOSCOPY N/A 11/26/2015   Procedure: COLONOSCOPY;  Surgeon: Daneil Dolin, MD;  [facility];  Service: Endoscopy;  Laterality: N/A;  7:30 am  . ERCP N/A 04/14/2015   Procedure: ENDOSCOPIC RETROGRADE CHOLANGIOPANCREATOGRAPHY (ERCP) Biliary Sphincterotomy, 10x7 stent placement Dilated bilary system just not well seen;  Surgeon: Rogene Houston, MD;  Location: [facility];  Service: Endoscopy;  Laterality: N/A;  . ERCP N/A 06/12/2015   Procedure: ENDOSCOPIC RETROGRADE CHOLANGIOPANCREATOGRAPHY (ERCP);  Surgeon: Rogene Houston, MD;  [facility];  Service: Endoscopy;  Laterality: N/A;  . ESOPHAGOGASTRODUODENOSCOPY N/A 06/12/2015   Procedure: DIAGNOSTIC ESOPHAGOGASTRODUODENOSCOPY (EGD);  Surgeon: Rogene Houston, MD;  [facility];  Service: Endoscopy;  Laterality: N/A;  . FEMUR IM NAIL Right 05/11/2019   Procedure: RETROGRADE INTRAMEDULLARY NAIL FEMORAL;  Surgeon: Shona Needles, MD;  Location: [place] [place];  Service: Orthopedics;  Laterality: Right;  . STENT REMOVAL  06/12/2015   Procedure: STENT REMOVAL ;  Surgeon: Rogene Houston, MD;  [facility];  Service: Endoscopy;;    Allergies as of 05/23/2019   No Known Allergies     Medication List    Notice  This visit is during an admission. Changes to the med list made in this visit will be reflected in the After Visit Summary of the admission.    Current Outpatient Medications on File Prior to Visit  Medication Sig Dispense Refill  . ALPRAZolam (XANAX) 0.5 MG tablet Take 1 tablet (0.5 mg total) by  mouth at bedtime. 30 tablet 0  . carboxymethylcellulose (REFRESH PLUS) 0.5 % SOLN Place 1 drop into both eyes in the morning, at noon, in the evening, and at bedtime.    . Cholecalciferol (VITAMIN D) 125 MCG (5000 UT) CAPS Take 5,000 Units by mouth daily. 90 capsule 1  . cyanocobalamin 2000 MCG tablet Take 2,000 mcg by mouth daily.    . cyclobenzaprine (FLEXERIL) 5 MG tablet Take 5 mg by mouth 3 (three) times daily as needed for muscle spasms.     Marland Kitchen enoxaparin (LOVENOX) 40 MG/0.4ML injection Inject 0.4 mLs (40 mg total) into the skin daily. 12 mL 0  . HYDROcodone-acetaminophen (NORCO/VICODIN) 5-325 MG tablet Take 1 tablet by mouth every 6 (six) hours as needed for up to 6 days for severe pain. 24 tablet 0  . levothyroxine (SYNTHROID) 100 MCG tablet Take 1 tablet (100 mcg total) by mouth daily. 90 tablet 3  . Lidocaine HCl-Benzyl Alcohol (SALONPAS LIDOCAINE PLUS) 4-10 % CREA Apply 1 patch topically daily.    . NON FORMULARY Diet -Regular    . OXYGEN Inhale into the lungs. Oxygen @@ 2L/min via [place] to maintain sats >88%Special Instructions: Document O2 sat qshift. Every Shift Day, Evening, Night     No current facility-administered medications on file prior to visit.     No orders of the defined types were placed in this encounter.   Immunization History  Administered Date(s) Administered  . Influenza,inj,quad, With Preservative 02/06/2019  . Influenza-Unspecified 12/06/2017    Social History   Tobacco Use  . Smoking status: Former Smoker    Packs/day: 0.15    Years: 20.00    Pack years: 3.00    Quit date: 03/08/2013    Years since quitting: 6.2  . Smokeless tobacco: Never Used  Substance Use Topics  . Alcohol use: No    Alcohol/week: 0.0 standard drinks    Family history is   Family History  Problem Relation Age of Onset  . Heart attack Father   . Stroke Father   . Breast cancer Sister   . Thyroid disease Neg Hx   . Colon cancer Neg Hx       Review of  Systems  She GENERAL:  no fevers, fatigue, appetite changes SKIN: No itching, or rash EYES: No eye pain, redness, discharge EARS: No earache, tinnitus, change in hearing NOSE: No congestion, drainage or bleeding  MOUTH/THROAT: No mouth or tooth pain, No sore throat RESPIRATORY: No cough, wheezing, SOB CARDIAC: No chest pain, palpitations, lower extremity edema  GI: No abdominal pain, No N/V/D or constipation, No heartburn or reflux  GU: No dysuria, frequency or urgency, or incontinence  MUSCULOSKELETAL: No unrelieved bone/joint pain NEUROLOGIC: No headache, dizziness or focal weakness PSYCHIATRIC: No c/o anxiety or sadness   Vitals:   05/23/19 1221  BP: 136/71  Pulse: 77  Resp: 20  Temp: (!) 97.1 F (36.2 C)    SpO2 Readings from Last 1 Encounters:  05/22/19 95%   Body mass index is 26.41 kg/m.     Physical Exam  GENERAL APPEARANCE: Alert, conversant,  No acute distress.  SKIN: Incision without excessive redness or heat HEAD: Normocephalic,  atraumatic  EYES: Conjunctiva/lids clear. Pupils round, reactive. EOMs intact.  EARS: External exam WNL, canals clear. Hearing grossly normal.  NOSE: No deformity or discharge.  MOUTH/THROAT: Lips w/o lesions  RESPIRATORY: Breathing is even, unlabored. Lung sounds are clear   CARDIOVASCULAR: Heart RRR no murmurs, rubs or gallops. No peripheral edema.   GASTROINTESTINAL: Abdomen is soft, non-tender, not distended w/ normal bowel sounds. GENITOURINARY: Bladder non tender, not distended  MUSCULOSKELETAL: No abnormal joints or musculature NEUROLOGIC:  Cranial nerves 2-12 grossly intact. Moves all extremities  PSYCHIATRIC: Mood and affect appropriate to situation, no behavioral issues  Patient Active Problem List   Diagnosis Date Noted  . Acute respiratory failure with hypoxia ([place]) 05/22/2019  . Vitamin B 12 deficiency 05/22/2019  . Chronic anxiety 05/22/2019  . Closed fracture of right femur ([place]) 05/10/2019  . Hypothyroidism  05/10/2019  . Unspecified atrial fibrillation ([place]) 05/10/2019  . Anxiety 05/10/2019  . Anemia, unspecified 05/10/2019  . Sacral lytic lesions 05/10/2019  . Bone lesion 05/08/2019  . Vitamin D deficiency 02/03/2016  . 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 History of colonic polyps 11/06/2015  . 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 Cholecystitis, acute 04/25/2015  . Anemia 04/25/2015  . Lower back pain   . Nausea & vomiting 04/21/2015  . History of biliary stent insertion 04/21/2015  . Volume depletion 04/21/2015  . Hypokalemia 04/21/2015  . AP (abdominal pain)   . Compression fracture of L2 lumbar vertebra ([facility])   . Uncontrollable vomiting   . Common bile duct dilation   . Cholelithiasis 04/12/2015  . Abdominal pain 04/12/2015  . TOBACCO ABUSE 08/21/2009  . ATRIAL FIBRILLATION, HX OF 08/21/2009      Labs reviewed: Basic Metabolic Panel:    Component Value Date/Time   NA 137 05/18/2019 0444   K 3.4 (L) 05/18/2019 0444   CL 89 (L) 05/18/2019 0444   CO2 34 (H) 05/18/2019 0444   GLUCOSE 103 (H) 05/18/2019 0444   BUN 14 05/18/2019 0444   CREATININE 0.58 05/18/2019 0444   CALCIUM 8.7 (L) 05/18/2019 0444   PROT 4.7 (L) 05/15/2019 0357   ALBUMIN 2.1 (L) 05/15/2019 0357   AST 19 05/15/2019 0357   ALT 9 05/15/2019 0357   ALKPHOS 78 05/15/2019 0357   BILITOT 0.9 05/15/2019 0357   GFRNONAA >60 05/18/2019 0444   GFRAA >60 05/18/2019 0444    Recent Labs    05/15/19 0357 05/17/19 0454 05/18/19 0444  NA 138 134* 137  K 3.9 2.9* 3.4*  CL 98 91* 89*  CO2 31 29 34*  GLUCOSE 100* 91 103*  BUN 12 10 14   CREATININE 0.54 0.52 0.58  CALCIUM 8.2* 8.2* 8.7*  MG 1.8 1.6* 2.0   Liver Function Tests: Recent Labs    05/13/19 0407 05/14/19 0251 05/15/19 0357  AST 16 19 19   ALT 9 9 9   ALKPHOS 56 71 78  BILITOT 0.5 1.1 0.9  PROT 4.2* 4.6* 4.7*  ALBUMIN 2.0* 2.2* 2.1*   No results for input(s): LIPASE, AMYLASE in the last 8760 hours. No results  for input(s): AMMONIA in the last 8760 hours. CBC: Recent Labs    05/08/19 1421 05/08/19 1421 05/10/19 1459 05/10/19 1459 05/11/19 0503 05/11/19 2132 05/15/19 0357 05/16/19 0628 05/17/19 0454  WBC 4.8   < > 6.2   < > 4.1   < > 4.7 3.5* 3.7*  NEUTROABS 2.7  --  4.8  --  2.5  --   --   --   --  HGB 8.5*   < > 6.9*   < > 10.4*   < > 9.0* 9.2* 8.8*  HCT 27.8*   < > 21.9*   < > 31.8*   < > 28.3* 28.9* 27.3*  MCV 90.8   < > 88.3   < > 86.6   < > 90.4 90.3 88.3  PLT 184   < > 218   < > 154   < > 190 195 227   < > = values in this interval not displayed.   Lipid No results for input(s): CHOL, HDL, LDLCALC, TRIG in the last 8760 hours.  Cardiac Enzymes: Recent Labs    05/10/19 1459  CKTOTAL 109   BNP: No results for input(s): BNP in the last 8760 hours. No results found for: MICROALBUR No results found for: HGBA1C Lab Results  Component Value Date   TSH 3.046 05/10/2019   Lab Results  Component Value Date   VITAMINB12 118 (L) 05/08/2019   Lab Results  Component Value Date   FOLATE 12.7 05/08/2019   Lab Results  Component Value Date   IRON 31 05/08/2019   TIBC 349 05/08/2019   FERRITIN 64 05/08/2019    Imaging and Procedures obtained prior to SNF admission: No results found.   Not all labs, radiology exams or other studies done during hospitalization come through on my EPIC note; however they are reviewed by me.    Assessment and Plan  Right femur fracture-status post retrograde IM nailing; received a total of 4 units PRBC throughout hospitalization; was started on Lovenox for DVT prophylaxis once hemoglobin stable SNF-admitted for OT/PT continue Lovenox 40 mg daily for total of 30 days postop since there are no other indications of how long the surgeon would like her to be on  Hypothyroidism SNF-not stated as uncontrolled; continue levothyroxine 100 mcg daily  Chronic low back pain SNF-continue lidocaine patch 5% daily  Anxiety SNF-continue Xanax 0.5  mg nightly   Testing greater than 35 minutes;> 50% of time with patient was spent reviewing records, labs, tests and studies, counseling and developing plan of care  Hennie Duos, MD

## 2019-05-23 NOTE — Telephone Encounter (Signed)
I called patient's daughter Nicole Kindred back, and discussed her bone biopsy results.  It showed multiple myeloma.  I briefly review the natural history of multiple myeloma and treatment options, and reassured her that this is very treatable disease, although we may not be able to cure it. She is scheduled to see Dr. Delton Coombes back on March 25, to discuss the treatment options.  She voiced good understanding, and will relay the message to her mother.  She appreciated call.  Truitt Merle  05/23/2019

## 2019-05-24 ENCOUNTER — Encounter (HOSPITAL_COMMUNITY)
Admission: RE | Admit: 2019-05-24 | Discharge: 2019-05-24 | Disposition: A | Discharge status: Home / Self Care | Payer: Medicare Other | Source: Skilled Nursing Facility | Attending: Internal Medicine | Admitting: Internal Medicine

## 2019-05-24 DIAGNOSIS — S72341D Displaced spiral fracture of shaft of right femur, subsequent encounter for closed fracture with routine healing: Secondary | ICD-10-CM | POA: Diagnosis present

## 2019-05-24 DIAGNOSIS — E039 Hypothyroidism, unspecified: Secondary | ICD-10-CM | POA: Diagnosis present

## 2019-05-24 DIAGNOSIS — F411 Generalized anxiety disorder: Secondary | ICD-10-CM | POA: Diagnosis present

## 2019-05-24 DIAGNOSIS — D649 Anemia, unspecified: Secondary | ICD-10-CM | POA: Diagnosis not present

## 2019-05-24 DIAGNOSIS — Z720 Tobacco use: Secondary | ICD-10-CM | POA: Diagnosis not present

## 2019-05-24 LAB — BASIC METABOLIC PANEL
Anion gap: 9 (ref 5–15)
BUN: 15 mg/dL (ref 8–23)
CO2: 33 mmol/L — ABNORMAL HIGH (ref 22–32)
Calcium: 8.9 mg/dL (ref 8.9–10.3)
Chloride: 95 mmol/L — ABNORMAL LOW (ref 98–111)
Creatinine, Ser: 0.52 mg/dL (ref 0.44–1.00)
GFR calc Af Amer: >60 mL/min (ref >60)
GFR calc non Af Amer: >60 mL/min (ref >60)
Glucose, Bld: 102 mg/dL — ABNORMAL HIGH (ref 70–99)
Potassium: 2.9 mmol/L — ABNORMAL LOW (ref 3.5–5.1)
Sodium: 137 mmol/L (ref 135–145)

## 2019-05-24 LAB — HEMOGLOBIN AND HEMATOCRIT, BLOOD
HCT: 29.5 % — ABNORMAL LOW (ref 36.0–46.0)
Hemoglobin: 9 g/dL — ABNORMAL LOW (ref 12.0–15.0)

## 2019-05-26 ENCOUNTER — Encounter: Payer: Self-pay | Admitting: Internal Medicine

## 2019-05-28 ENCOUNTER — Other Ambulatory Visit (HOSPITAL_COMMUNITY)
Admission: AD | Admit: 2019-05-28 | Discharge: 2019-05-28 | Disposition: A | Discharge status: Home / Self Care | Payer: Medicare Other | Source: Other Acute Inpatient Hospital | Attending: Adult Health | Admitting: Adult Health

## 2019-05-28 ENCOUNTER — Other Ambulatory Visit: Payer: Self-pay | Admitting: Adult Health

## 2019-05-28 DIAGNOSIS — E876 Hypokalemia: Secondary | ICD-10-CM | POA: Insufficient documentation

## 2019-05-28 LAB — POTASSIUM: Potassium: 3.5 mmol/L (ref 3.5–5.1)

## 2019-05-28 MED ORDER — HYDROCODONE-ACETAMINOPHEN 5-325 MG PO TABS: 1.0000 | ORAL_TABLET | Freq: Two times a day (BID) | ORAL | 0 refills | Status: DC | PRN

## 2019-05-29 ENCOUNTER — Encounter: Payer: Self-pay | Admitting: Adult Health

## 2019-05-29 ENCOUNTER — Non-Acute Institutional Stay (SKILLED_NURSING_FACILITY): Payer: Medicare Other | Admitting: Adult Health

## 2019-05-29 DIAGNOSIS — I4891 Unspecified atrial fibrillation: Secondary | ICD-10-CM

## 2019-05-29 DIAGNOSIS — E890 Postprocedural hypothyroidism: Secondary | ICD-10-CM

## 2019-05-29 DIAGNOSIS — S32020D Wedge compression fracture of second lumbar vertebra, subsequent encounter for fracture with routine healing: Secondary | ICD-10-CM | POA: Diagnosis not present

## 2019-05-29 DIAGNOSIS — S72001D Fracture of unspecified part of neck of right femur, subsequent encounter for closed fracture with routine healing: Secondary | ICD-10-CM | POA: Diagnosis not present

## 2019-05-29 NOTE — Progress Notes (Signed)
Location:    [place] Room Number: 157/W Place of Service:  SNF (31)   CODE STATUS: DNR  No Known Allergies  Chief Complaint  Patient presents with  . Medical Management of Chronic Issues        Compression fracture of L2 vertebrae with routine healing subsequent encounter/ closed fracture of neck of right femur with routine healing subsequent encounter:     Hypothyroidism stats post radioiodine therap Atrial fibrillation unspecified type: weekly follow up for the first 30 days post hospitalization.       HPI:  She is a 79 year old short term rehab patient being seen for the management of her chronic illnesses; afib; hypothyroidism; L2 compression fracture; right femur fracture. There are no reports of uncontrolled pain; no changes in appetite; no palpitations; no anxiety.   Past Medical History:  Diagnosis Date  . Atrial fibrillation ([place]) 2011   Postop, spontaneous conversion to normal sinus after one hour  . Compression fracture 07/24/09   T12; kyphoplasty  . History of echocardiogram 5/11   EF 65%  . Hypertension   . Thyroid disease   . Tobacco abuse     Past Surgical History:  Procedure Laterality Date  . BACK SURGERY    . BACK SURGERY  06/06/2015  . BREAST EXCISIONAL BIOPSY Left    50 years ago  benign  . CHOLECYSTECTOMY N/A 04/25/2015   Procedure: LAPAROSCOPIC CHOLECYSTECTOMY WITH INTRAOPERATIVE CHOLANGIOGRAM;  Surgeon: Mickeal Skinner, MD;  Location: [facility];  Service: General;  Laterality: N/A;  . COLONOSCOPY N/A 11/26/2015   Procedure: COLONOSCOPY;  Surgeon: Daneil Dolin, MD;  Location[facility];  Service: Endoscopy;  Laterality: N/A;  7:30 am  . ERCP N/A 04/14/2015   Procedure: ENDOSCOPIC RETROGRADE CHOLANGIOPANCREATOGRAPHY (ERCP) Biliary Sphincterotomy, 10x7 stent placement Dilated bilary system just not well seen;  Surgeon: Rogene Houston, MD;  Location: [facility];  Service: Endoscopy;  Laterality: N/A;  . ERCP N/A 06/12/2015   Procedure: ENDOSCOPIC RETROGRADE CHOLANGIOPANCREATOGRAPHY (ERCP);  Surgeon: Rogene Houston, MD;  [facility];  Service: Endoscopy;  Laterality: N/A;  . ESOPHAGOGASTRODUODENOSCOPY N/A 06/12/2015   Procedure: DIAGNOSTIC ESOPHAGOGASTRODUODENOSCOPY (EGD);  Surgeon: Rogene Houston, MD;  [facility];  Service: Endoscopy;  Laterality: N/A;  . FEMUR IM NAIL Right 05/11/2019   Procedure: RETROGRADE INTRAMEDULLARY NAIL FEMORAL;  Surgeon: Shona Needles, MD;  Location: [place];  Service: Orthopedics;  Laterality: Right;  . STENT REMOVAL  06/12/2015   Procedure: STENT REMOVAL ;  Surgeon: Rogene Houston, MD;  [facility];  Service: Endoscopy;;    Social History   Socioeconomic History  . Marital status: Widowed    Spouse name: Not on file  . Number of children: 2  . Years of education: Not on file  . Highest education level: Not on file  Occupational History  . Not on file  Tobacco Use  . Smoking status: Former Smoker    Packs/day: 0.15    Years: 20.00    Pack years: 3.00    Quit date: 03/08/2013    Years since quitting: 6.2  . Smokeless tobacco: Never Used  Substance and Sexual Activity  . Alcohol use: No    Alcohol/week: 0.0 standard drinks  . Drug use: No  . Sexual activity: Not Currently  Other Topics Concern  . Not on file  Social History Narrative   Active in gardens and does yard work.   Social Determinants of Health  Financial Resource Strain: Low Risk   . Difficulty of Paying Living Expenses: Not hard at all  Food Insecurity: No Food Insecurity  . Worried About Charity fundraiser in the Last Year: Never true  . Ran Out of Food in the Last Year: Never true  Transportation Needs: No Transportation Needs  . Lack of Transportation (Medical): No  . Lack of Transportation (Non-Medical): No  Physical Activity: Inactive  . Days of Exercise per Week: 0 days  . Minutes of Exercise per Session: 0 min  Stress: No Stress Concern Present  . Feeling of  Stress : Not at all  Social Connections:   . Frequency of Communication with Friends and Family:   . Frequency of Social Gatherings with Friends and Family:   . Attends Religious Services:   . Active Member of Clubs or Organizations:   . Attends Archivist Meetings:   Marland Kitchen Marital Status:   Intimate Partner Violence: Not At Risk  . Fear of Current or Ex-Partner: No  . Emotionally Abused: No  . Physically Abused: No  . Sexually Abused: No   Family History  Problem Relation Age of Onset  . Heart attack Father   . Stroke Father   . Breast cancer Sister   . Thyroid disease Neg Hx   . Colon cancer Neg Hx       VITAL SIGNS BP 132/89   Pulse 78   Temp (!) 97.1 F (36.2 C) (Oral)   Resp 20   Ht 5\' 2"  (1.575 m)   Wt 144 lb 6.4 oz (65.5 kg)   SpO2 93%   BMI 26.41 kg/m   Outpatient Encounter Medications as of 05/29/2019  Medication Sig  . ALPRAZolam (XANAX) 0.5 MG tablet Take 1 tablet (0.5 mg total) by mouth at bedtime.  . carboxymethylcellulose (REFRESH PLUS) 0.5 % SOLN Place 1 drop into both eyes in the morning, at noon, in the evening, and at bedtime.  . Cholecalciferol (VITAMIN D) 125 MCG (5000 UT) CAPS Take 5,000 Units by mouth daily.  . cyanocobalamin 2000 MCG tablet Take 2,000 mcg by mouth daily.  . cyclobenzaprine (FLEXERIL) 5 MG tablet Take 5 mg by mouth 3 (three) times daily as needed for muscle spasms.   Marland Kitchen enoxaparin (LOVENOX) 40 MG/0.4ML injection Inject 0.4 mLs (40 mg total) into the skin daily.  . feeding supplement, ENSURE ENLIVE, (ENSURE ENLIVE) LIQD Take 237 mLs by mouth daily.  Marland Kitchen HYDROcodone-acetaminophen (NORCO/VICODIN) 5-325 MG tablet Take 1 tablet by mouth every 12 (twelve) hours as needed for up to 7 days for severe pain.  Marland Kitchen levothyroxine (SYNTHROID) 100 MCG tablet Take 1 tablet (100 mcg total) by mouth daily.  . Lidocaine HCl-Benzyl Alcohol (SALONPAS LIDOCAINE PLUS) 4-10 % CREA Apply 1 patch topically daily.  . NON FORMULARY Diet -Regular  . OXYGEN  Inhale into the lungs. Oxygen @@ 2L/min via [place] to maintain sats >88%Special Instructions: Document O2 sat qshift. Every Shift Day, Evening, Night   No facility-administered encounter medications on file as of 05/29/2019.     SIGNIFICANT DIAGNOSTIC EXAMS   PREVIOUS  05-10-19: chest x-ray: New compression fracture of T10 since 06/11/2015. No acute cardiopulmonary abnormalities  05-10-19: right femur x-ray: Displaced spiral fracture of the distal femoral diaphysis with valgus angulation and external rotation of the lower fracture fragment.  05-10-19: right knee x-ray: Displaced distal femoral fracture better assessed on dedicated femur radiographs performed concurrently. Trace knee effusion.  No additional fractures.  05-10-19: ct of right femur:  1.  Displaced spiral fracture of the distal third of the right femoral diaphysis with external rotation and valgus angulation of the distal fracture fragment. No dislocation. 2. Small suprapatellar effusion. 3. Partially visualized lucent area in the lateral aspect of the right sacral bone adjacent to the SI joint. This may represent degenerative changes and osteopenia although a lytic lesion is not excluded. MRI may provide better evaluation on a nonemergent/outpatient basis.   05-14-19: MRI of lumbar spine:  New but chronic appearing compression deformity of L4. Multilevel degenerative changes with some progression since remote study of 2011. Stenosis is greatest at L3-L4.  05-15-19: MRI of pelvis:  1. Expansile lesions of the right upper sacral ala, left upper sacrum, and right posterior acetabular wall. The larger right sacral lesion abuts and displaces the right sacral plexus. Top differential diagnostic considerations include myeloma and metastatic disease. 2. No other pelvic masses are identified. 3. Nonspecific presacral edema, increased compared to 05/01/2019. 4. Sigmoid colon diverticulosis.  NO NEW EXAMS  LABS REVIEWED:   05-08-19: vit B 12:  118 iron 31; TIBC 349 05-10-19 wbc 6.2; hgb 6.9; hct 21.9; mcv 88.3 plt 218; glucose 145; bun 26; creat 0.62; k+ 3.7; na++ 135; ca 8.5  tsh 3.046; guaiac neg 05-12-19: vit D 22.25; hgb 7.1; hct 22.3 05-13-19: wbc 3.6; hgb 7.0; hct 22.5; mcv 89.3 plt 140; glucose 85; bun 14; creat 0.60 ;k+ 3.4; na++ 137; ca 7.4 liver normal albumin 2.0 05-17-19: wbc 3.7; hgb 8.8; hct 27.3 ;mcv 88.3 plt 227; glucose 91; bun 10; creat 0.52; k+ 2.9; na++ 134; ca 8.2; mag 1.6  05-18-19: glucose 103; bun 14; creat 0.58; k+ 3.4; na++ 137; ca 8.7   NO NEW LABS.   Review of Systems  Constitutional: Negative for malaise/fatigue.  Respiratory: Negative for cough and shortness of breath.   Cardiovascular: Negative for chest pain, palpitations and leg swelling.  Gastrointestinal: Negative for abdominal pain, constipation and heartburn.  Musculoskeletal: Negative for back pain, joint pain and myalgias.  Skin: Negative.   Neurological: Negative for dizziness.  Psychiatric/Behavioral: The patient is not nervous/anxious.       Physical Exam Constitutional:      General: She is not in acute distress.    Appearance: She is well-developed. She is not diaphoretic.  Neck:     Thyroid: No thyromegaly.  Cardiovascular:     Rate and Rhythm: Normal rate and regular rhythm.     Pulses: Normal pulses.     Heart sounds: Normal heart sounds.  Pulmonary:     Effort: Pulmonary effort is normal. No respiratory distress.     Breath sounds: Normal breath sounds.     Comments:   Abdominal:     General: Bowel sounds are normal. There is no distension.     Palpations: Abdomen is soft.     Tenderness: There is no abdominal tenderness.  Musculoskeletal:     Cervical back: Neck supple.     Right lower leg: Edema present.     Left lower leg: No edema.     Comments: Is able to move all extremities  Is status right femur IM nail 05-11-19   Lymphadenopathy:     Cervical: No cervical adenopathy.  Skin:    General: Skin is warm and dry.    Neurological:     Mental Status: She is alert and oriented to person, place, and time.  Psychiatric:        Mood and Affect: Mood normal.      ASSESSMENT/ PLAN:  TODAY  1. Compression fracture of L2 vertebrae with routine healing subsequent encounter/ closed fracture of neck of right femur with routine healing subsequent encounter: is stable will continue flexeril 5 mg three times daily as needed vicodin 5/325 mg every 6 hours as needed lidoderm patch on back.   2. Hypothyroidism stats post radioiodine therapy is stable tsh 3.046 will continue synthroid 100 mcg daily  3. Atrial fibrillation unspecified type: is stable will monitor   PREVIOUS   4. Acute respiratory failure with hypoxia: is stable is on 02 will monitor   5. Vitamin D deficiency: level is 22.25 will continue Vit D 5,000 units daily   6. Vitamin B12: level is 118 will begin vitamin B12: 2000 mcg daily  7. Anemia unspecified type: is stable hgb 8.8 is status post transfusions  8. Chronic anxiety: is stable will continue xanax 0.5 mg nightly     MD is aware of resident's narcotic use and is in agreement with current plan of care. We will attempt to wean resident as appropriate.  Ok Edwards NP [facility]  Contact [phone] Monday through Friday 8am- 5pm  After hours call [phone]

## 2019-05-31 ENCOUNTER — Encounter (HOSPITAL_COMMUNITY): Payer: Self-pay | Admitting: Hematology

## 2019-05-31 ENCOUNTER — Inpatient Hospital Stay (HOSPITAL_BASED_OUTPATIENT_CLINIC_OR_DEPARTMENT_OTHER): Payer: Medicare Other | Admitting: Hematology

## 2019-05-31 DIAGNOSIS — I10 Essential (primary) hypertension: Secondary | ICD-10-CM | POA: Diagnosis not present

## 2019-05-31 DIAGNOSIS — I4891 Unspecified atrial fibrillation: Secondary | ICD-10-CM | POA: Diagnosis not present

## 2019-05-31 DIAGNOSIS — C9000 Multiple myeloma not having achieved remission: Secondary | ICD-10-CM | POA: Diagnosis not present

## 2019-05-31 DIAGNOSIS — M545 Low back pain: Secondary | ICD-10-CM | POA: Diagnosis not present

## 2019-05-31 DIAGNOSIS — Z87891 Personal history of nicotine dependence: Secondary | ICD-10-CM | POA: Diagnosis not present

## 2019-05-31 DIAGNOSIS — D649 Anemia, unspecified: Secondary | ICD-10-CM | POA: Diagnosis not present

## 2019-05-31 DIAGNOSIS — Z803 Family history of malignant neoplasm of breast: Secondary | ICD-10-CM | POA: Diagnosis not present

## 2019-05-31 DIAGNOSIS — M899 Disorder of bone, unspecified: Secondary | ICD-10-CM | POA: Diagnosis not present

## 2019-05-31 DIAGNOSIS — E538 Deficiency of other specified B group vitamins: Secondary | ICD-10-CM | POA: Diagnosis present

## 2019-05-31 DIAGNOSIS — Z7901 Long term (current) use of anticoagulants: Secondary | ICD-10-CM | POA: Diagnosis not present

## 2019-05-31 DIAGNOSIS — Z79899 Other long term (current) drug therapy: Secondary | ICD-10-CM | POA: Diagnosis not present

## 2019-05-31 MED ORDER — CYANOCOBALAMIN 1000 MCG/ML IJ SOLN
INTRAMUSCULAR | Status: AC
  Filled 2019-05-31: qty 1

## 2019-05-31 MED ORDER — CYANOCOBALAMIN 1000 MCG/ML IJ SOLN
1000.0000 ug | Freq: Once | INTRAMUSCULAR | Status: AC
  Administered 2019-05-31: 09:00:00 1000 ug via INTRAMUSCULAR

## 2019-05-31 NOTE — Progress Notes (Signed)
I met with patient and her daughter Courtney Arnold today during the visit with Dr. Delton Coombes.  I gave an overview of the proposed treatment plan and the next steps.  I provided education on chemotherapy and what to expect.  I explained to them both that they will get more detailed education at a later time.  They were given time to ask questions and all were answered to their satisfaction.

## 2019-05-31 NOTE — Assessment & Plan Note (Addendum)
1.  IgA kappa plasma cell myeloma: -CT CAP on 05/15/2019 shows bilateral lytic soft tissue lesions involving bilateral sacrum measuring up to 3.2 cm on the right and 2.1 cm on the left.  Extensive asymmetric soft tissue swelling about the right lower extremity which is likely related to the patient's recent fracture. -Bone scan on 05/16/2019 shows areas of increased uptake at the site of the right femoral fracture and in the T10 vertebral body.  Areas of relative photopenia in the left femur, left tibia and right proximal humerus. -Right sacral bone lesion biopsy on 05/17/2019 shows kappa restricted plasma cell neoplasm. -SPEP did not show any identifiable M spike.  Immunofixation shows IgA kappa monoclonal protein.  Kappa light chains are 42.8, lambda light chains 9.9 and ratio 4.32. -She will need 24-hour urine for total protein, UPEP and urine immunofixation. -She will also need bone marrow aspiration biopsy, specimen sent for [facility] panel. -We talked extensively about her new diagnosis.  I have recommended treatment with RVD based regimen. -We talked about side effects of Revlimid, Velcade and dexamethasone in detail.  2.  Normocytic anemia: -Previous work-up showed low B12 level with elevated methylmalonic acid.  Percent saturation was 9 and ferritin was 64.  Folic acid was normal.  Stool for occult blood was negative in the ER on 05/01/2019. -LDH was normal.  3.  Right femur fracture: -She is currently at rehab facility.  She will be released home over this weekend. -She will continue home physical therapy.  She is walking with the help of walker.  4.  ID prophylaxis: -I have recommended acyclovir 400 mg twice daily and aspirin 81 mg for thromboprophylaxis.

## 2019-05-31 NOTE — Patient Instructions (Addendum)
[place] at [facility] Discharge Instructions  You were seen today by Dr. Delton Coombes. He went over your recent test results. Your bone marrow biopsy showed that you have a condition called myeloma. He will do further testing to further diagnose you. Your condition is not curable, but it is treatable. He recommends you have a bone marrow biopsy. He will get you scheduled for the bone marrow biopsy. He discussed treatment options and what to expect with them. He will restart your B12 injections monthly. He wants you to start taking 83m Aspirin daily. He will schedule you for a PET scan as well.    [facility].  To afford each patient quality time with our provider, please arrive at least 15 minutes before your scheduled appointment time.   If you have a [facility] with [facility] come in thru the  Main Entrance and check in at the main information desk  You need to re-schedule your appointment should you arrive 10 or more minutes late.  We strive to give you quality time with our providers, and arriving late affects you and other patients whose appointments are after yours.  Also, if you no show three or more times for appointments you may be dismissed from the clinic at the providers discretion.     Again, [facility]  Our hope is that these requests will decrease the amount of time that you wait before being seen by our physicians.       _____________________________________________________________  Should you have questions [facility] please contact our office at [phone] between the hours of 8:00 a.m. and 4:30 p.m.  Voicemails left after 4:00 p.m. will not be returned until the following business day.  For prescription refill requests, have your pharmacy contact our office  and allow 72 hours.    Cancer Center Support Programs:   > Cancer Support Group  2nd Tuesday of the month 1pm-2pm, Journey Room

## 2019-05-31 NOTE — Progress Notes (Signed)
[facility] [place], [place] [address]   CLINIC:  Medical Oncology/Hematology  PCP:  [name], [place] [id number] [phone]   REASON FOR VISIT:  Follow-up for IgA kappa plasma cell myeloma.  CURRENT THERAPY: RVD.   INTERVAL HISTORY:  Courtney Arnold 79 y.o. female seen for follow-up of plasma cell myeloma.  From her last visit, she unfortunately fell and broke her right femur.  She ended up in the hospital.  She ended up having right sacral bone lesion biopsy on 05/17/2019.  She is currently at rehab facility.  She is accompanied by her daughter today.  Appetite is 25%.  Energy levels are 75%.  Pain in the lower back region on the left side is reported as 3 out of 10.    REVIEW OF SYSTEMS:  Review of Systems  Musculoskeletal: Positive for back pain.  All other systems reviewed and are negative.    PAST MEDICAL/SURGICAL HISTORY:  Past Medical History:  Diagnosis Date  . Atrial fibrillation ([place]) 2011   Postop, spontaneous conversion to normal sinus after one hour  . Compression fracture 07/24/09   T12; kyphoplasty  . History of echocardiogram 5/11   EF 65%  . Hypertension   . Thyroid disease   . Tobacco abuse    Past Surgical History:  Procedure Laterality Date  . BACK SURGERY    . BACK SURGERY  06/06/2015  . BREAST EXCISIONAL BIOPSY Left    50 years ago  benign  . CHOLECYSTECTOMY N/A 04/25/2015   Procedure: LAPAROSCOPIC CHOLECYSTECTOMY WITH INTRAOPERATIVE CHOLANGIOGRAM;  Surgeon: Mickeal Skinner, MD;  [facility]: [facility];  Service: General;  Laterality: N/A;  . COLONOSCOPY N/A 11/26/2015   Procedure: COLONOSCOPY;  Surgeon: Daneil Dolin, MD;  [facility];  Service: Endoscopy;  Laterality: N/A;  7:30 am  . ERCP N/A 04/14/2015   Procedure: ENDOSCOPIC RETROGRADE CHOLANGIOPANCREATOGRAPHY (ERCP) Biliary Sphincterotomy, 10x7 stent placement Dilated bilary system just not well seen;  Surgeon: Rogene Houston, MD;  [facility]: [facility];  Service: Endoscopy;  Laterality: N/A;  . ERCP N/A 06/12/2015   Procedure: ENDOSCOPIC RETROGRADE CHOLANGIOPANCREATOGRAPHY (ERCP);  Surgeon: Rogene Houston, MD;  [facility];  Service: Endoscopy;  Laterality: N/A;  . ESOPHAGOGASTRODUODENOSCOPY N/A 06/12/2015   Procedure: DIAGNOSTIC ESOPHAGOGASTRODUODENOSCOPY (EGD);  Surgeon: Rogene Houston, MD;  [facility];  Service: Endoscopy;  Laterality: N/A;  . FEMUR IM NAIL Right 05/11/2019   Procedure: RETROGRADE INTRAMEDULLARY NAIL FEMORAL;  Surgeon: Shona Needles, MD;  Location: [facility];  Service: Orthopedics;  Laterality: Right;  . STENT REMOVAL  06/12/2015   Procedure: STENT REMOVAL ;  Surgeon: Rogene Houston, MD;  [facility];  Service: Endoscopy;;     SOCIAL HISTORY:  Social History   Socioeconomic History  . Marital status: Widowed    Spouse name: Not on file  . Number of children: 2  . Years of education: Not on file  . Highest education level: Not on file  Occupational History  . Not on file  Tobacco Use  . Smoking status: Former Smoker    Packs/day: 0.15    Years: 20.00    Pack years: 3.00    Quit date: 03/08/2013    Years since quitting: 6.2  . Smokeless tobacco: Never Used  Substance and Sexual Activity  . Alcohol use: No    Alcohol/week: 0.0 standard drinks  . Drug use: No  . Sexual activity: Not  Currently  Other Topics Concern  . Not on file  Social History Narrative   Active in gardens and does yard work.   Social Determinants of Health   Financial Resource Strain: Low Risk   . Difficulty of Paying Living Expenses: Not hard at all  Food Insecurity: No Food Insecurity  . Worried About Charity fundraiser in the Last Year: Never true  . Ran Out of Food in the Last Year: Never true  Transportation Needs: No Transportation Needs  . Lack of Transportation (Medical): No  . Lack of Transportation (Non-Medical): No  Physical Activity: Inactive  . Days of  Exercise per Week: 0 days  . Minutes of Exercise per Session: 0 min  Stress: No Stress Concern Present  . Feeling of Stress : Not at all  Social Connections:   . Frequency of Communication with Friends and Family:   . Frequency of Social Gatherings with Friends and Family:   . Attends Religious Services:   . Active Member of Clubs or Organizations:   . Attends Archivist Meetings:   Marland Kitchen Marital Status:   Intimate Partner Violence: Not At Risk  . Fear of Current or Ex-Partner: No  . Emotionally Abused: No  . Physically Abused: No  . Sexually Abused: No    FAMILY HISTORY:  Family History  Problem Relation Age of Onset  . Heart attack Father   . Stroke Father   . Breast cancer Sister   . Thyroid disease Neg Hx   . Colon cancer Neg Hx     CURRENT MEDICATIONS:  Outpatient Encounter Medications as of 05/31/2019  Medication Sig  . ALPRAZolam (XANAX) 0.5 MG tablet Take 1 tablet (0.5 mg total) by mouth at bedtime.  . carboxymethylcellulose (REFRESH PLUS) 0.5 % SOLN Place 1 drop into both eyes in the morning, at noon, in the evening, and at bedtime.  . Cholecalciferol (VITAMIN D) 125 MCG (5000 UT) CAPS Take 5,000 Units by mouth daily.  . cyclobenzaprine (FLEXERIL) 5 MG tablet Take 5 mg by mouth 3 (three) times daily as needed for muscle spasms.   Marland Kitchen enoxaparin (LOVENOX) 40 MG/0.4ML injection Inject 0.4 mLs (40 mg total) into the skin daily.  . feeding supplement, ENSURE ENLIVE, (ENSURE ENLIVE) LIQD Take 237 mLs by mouth daily.  Marland Kitchen HYDROcodone-acetaminophen (NORCO/VICODIN) 5-325 MG tablet Take 1 tablet by mouth every 12 (twelve) hours as needed for up to 7 days for severe pain.  Marland Kitchen levothyroxine (SYNTHROID) 100 MCG tablet Take 1 tablet (100 mcg total) by mouth daily.  . Lidocaine HCl-Benzyl Alcohol (SALONPAS LIDOCAINE PLUS) 4-10 % CREA Apply 1 patch topically daily.  . NON FORMULARY Diet -Regular  . OXYGEN Inhale into the lungs. Oxygen @@ 2L/min via [place] to maintain sats >88%Special  Instructions: Document O2 sat qshift. Every Shift Day, Evening, Night  . cyanocobalamin 2000 MCG tablet Take 2,000 mcg by mouth daily.  . [EXPIRED] cyanocobalamin ((VITAMIN B-12)) injection 1,000 mcg    No facility-administered encounter medications on file as of 05/31/2019.    ALLERGIES:  No Known Allergies   PHYSICAL EXAM:  ECOG Performance status: 2  Vitals:   05/31/19 0814  BP: (!) 110/57  Pulse: 92  Resp: 18  Temp: (!) 96.9 F (36.1 C)  SpO2: 98%   Filed Weights   05/31/19 0814  Weight: 136 lb (61.7 kg)    Physical Exam Vitals reviewed.  Constitutional:      Appearance: Normal appearance.  Cardiovascular:     Rate and Rhythm:  Normal rate and regular rhythm.     Heart sounds: Normal heart sounds.  Pulmonary:     Effort: Pulmonary effort is normal.     Breath sounds: Normal breath sounds.  Abdominal:     General: There is no distension.     Palpations: Abdomen is soft.  Skin:    General: Skin is warm.  Neurological:     General: No focal deficit present.     Mental Status: She is alert and oriented to person, place, and time.  Psychiatric:        Mood and Affect: Mood normal.        Behavior: Behavior normal.      LABORATORY DATA:  I have reviewed the labs as listed.  CBC    Component Value Date/Time   WBC 3.7 (L) 05/17/2019 0454   RBC 3.09 (L) 05/17/2019 0454   HGB 9.0 (L) 05/24/2019 0500   HCT 29.5 (L) 05/24/2019 0500   PLT 227 05/17/2019 0454   MCV 88.3 05/17/2019 0454   MCH 28.5 05/17/2019 0454   MCHC 32.2 05/17/2019 0454   RDW 15.0 05/17/2019 0454   LYMPHSABS 1.1 05/11/2019 0503   MONOABS 0.4 05/11/2019 0503   EOSABS 0.0 05/11/2019 0503   BASOSABS 0.0 05/11/2019 0503   CMP Latest Ref Rng & Units 05/28/2019 05/24/2019 05/18/2019  Glucose 70 - 99 mg/dL - 102(H) 103(H)  BUN 8 - 23 mg/dL - 15 14  Creatinine 0.44 - 1.00 mg/dL - 0.52 0.58  Sodium 135 - 145 mmol/L - 137 137  Potassium 3.5 - 5.1 mmol/L 3.5 2.9(L) 3.4(L)  Chloride 98 - 111  mmol/L - 95(L) 89(L)  CO2 22 - 32 mmol/L - 33(H) 34(H)  Calcium 8.9 - 10.3 mg/dL - 8.9 8.7(L)  Total Protein 6.5 - 8.1 g/dL - - -  Total Bilirubin 0.3 - 1.2 mg/dL - - -  Alkaline Phos 38 - 126 U/L - - -  AST 15 - 41 U/L - - -  ALT 0 - 44 U/L - - -       DIAGNOSTIC IMAGING:  I have independently reviewed the scans and discussed with the patient.    ASSESSMENT & PLAN:   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H[facility]) 1.  IgA kappa plasma cell myeloma: -CT CAP on 05/15/2019 shows bilateral lytic soft tissue lesions involving bilateral sacrum measuring up to 3.2 cm on the right and 2.1 cm on the left.  Extensive asymmetric soft tissue swelling about the right lower extremity which is likely related to the patient's recent fracture. -Bone scan on 05/16/2019 shows areas of increased uptake at the site of the right femoral fracture and in the T10 vertebral body.  Areas of relative photopenia in the left femur, left tibia and right proximal humerus. -Right sacral bone lesion biopsy on 05/17/2019 shows kappa restricted plasma cell neoplasm. -SPEP did not show any identifiable M spike.  Immunofixation shows IgA kappa monoclonal protein.  Kappa light chains are 42.8, lambda light chains 9.9 and ratio 4.32. -She will need 24-hour urine for total protein, UPEP and urine immunofixation. -She will also need bone marrow aspiration biopsy, specimen sent for [facility] panel. -We talked extensively about her new diagnosis.  I have recommended treatment with RVD based regimen. -We talked about side effects of Revlimid, Velcade and dexamethasone in detail.  2.  Normocytic anemia: -Previous work-up showed low B12 level with elevated methylmalonic acid.  Percent saturation was 9 and ferritin was 64.  Folic acid was normal.  Stool for  occult blood was negative in the ER on 05/01/2019. -LDH was normal.  3.  Right femur fracture: -She is currently at rehab facility.  She will be released home over this weekend. -She will continue home  physical therapy.  She is walking with the help of walker.  4.  ID prophylaxis: -I have recommended acyclovir 400 mg twice daily and aspirin 81 mg for thromboprophylaxis.      Orders placed this encounter:  No orders of the defined types were placed in this encounter.  Total time spent is 40 minutes with more than 50% of the time spent face-to-face discussing new diagnosis, further work-up, treatment plan, counseling and coordination of care.   Derek Jack, MD [place] [phone]

## 2019-06-01 ENCOUNTER — Non-Acute Institutional Stay (SKILLED_NURSING_FACILITY): Payer: Medicare Other | Admitting: Adult Health

## 2019-06-01 ENCOUNTER — Other Ambulatory Visit: Payer: Self-pay | Admitting: Adult Health

## 2019-06-01 DIAGNOSIS — S32020D Wedge compression fracture of second lumbar vertebra, subsequent encounter for fracture with routine healing: Secondary | ICD-10-CM

## 2019-06-01 DIAGNOSIS — E890 Postprocedural hypothyroidism: Secondary | ICD-10-CM | POA: Diagnosis not present

## 2019-06-01 DIAGNOSIS — S72001D Fracture of unspecified part of neck of right femur, subsequent encounter for closed fracture with routine healing: Secondary | ICD-10-CM | POA: Diagnosis not present

## 2019-06-01 MED ORDER — CYCLOBENZAPRINE HCL 5 MG PO TABS: 5.0000 mg | ORAL_TABLET | Freq: Three times a day (TID) | ORAL | 0 refills | Status: DC | PRN

## 2019-06-01 MED ORDER — ALPRAZOLAM 0.5 MG PO TABS: 0.5000 mg | ORAL_TABLET | Freq: Every day | ORAL | 0 refills | Status: DC

## 2019-06-01 MED ORDER — LEVOTHYROXINE SODIUM 100 MCG PO TABS: 100.0000 ug | ORAL_TABLET | Freq: Every day | ORAL | 0 refills | Status: DC

## 2019-06-01 MED ORDER — HYDROCODONE-ACETAMINOPHEN 5-325 MG PO TABS: 1.0000 | ORAL_TABLET | Freq: Two times a day (BID) | ORAL | 0 refills | Status: DC | PRN

## 2019-06-03 DIAGNOSIS — M545 Low back pain: Secondary | ICD-10-CM | POA: Diagnosis not present

## 2019-06-03 DIAGNOSIS — S72301D Unspecified fracture of shaft of right femur, subsequent encounter for closed fracture with routine healing: Secondary | ICD-10-CM | POA: Diagnosis not present

## 2019-06-03 DIAGNOSIS — E039 Hypothyroidism, unspecified: Secondary | ICD-10-CM | POA: Diagnosis not present

## 2019-06-03 DIAGNOSIS — I4891 Unspecified atrial fibrillation: Secondary | ICD-10-CM | POA: Diagnosis not present

## 2019-06-03 DIAGNOSIS — Z7982 Long term (current) use of aspirin: Secondary | ICD-10-CM | POA: Diagnosis not present

## 2019-06-03 DIAGNOSIS — F419 Anxiety disorder, unspecified: Secondary | ICD-10-CM | POA: Diagnosis not present

## 2019-06-03 DIAGNOSIS — D649 Anemia, unspecified: Secondary | ICD-10-CM | POA: Diagnosis not present

## 2019-06-03 DIAGNOSIS — I10 Essential (primary) hypertension: Secondary | ICD-10-CM | POA: Diagnosis not present

## 2019-06-03 DIAGNOSIS — E559 Vitamin D deficiency, unspecified: Secondary | ICD-10-CM | POA: Diagnosis not present

## 2019-06-03 DIAGNOSIS — J9601 Acute respiratory failure with hypoxia: Secondary | ICD-10-CM | POA: Diagnosis not present

## 2019-06-03 DIAGNOSIS — E538 Deficiency of other specified B group vitamins: Secondary | ICD-10-CM | POA: Diagnosis not present

## 2019-06-03 DIAGNOSIS — W19XXXD Unspecified fall, subsequent encounter: Secondary | ICD-10-CM | POA: Diagnosis not present

## 2019-06-03 DIAGNOSIS — G8929 Other chronic pain: Secondary | ICD-10-CM | POA: Diagnosis not present

## 2019-06-03 NOTE — Progress Notes (Signed)
Location:   [facility] Room Number: [id number] of Service:  SNF (31)    CODE STATUS: full code   No Known Allergies  Chief Complaint  Patient presents with  . Discharge Note    to home     HPI:  She is being discharged to home with home health for pt/ot. She will need a front wheel walker. She will need her prescriptions written and will need to follow up with her medical provider. She was hospitalized for right femur fracture. She was admitted to this facility for short term rehab. She has participated in pt/ot to improve upon her level of independence. She is ready to complete her therapy on a home health basis.    Past Medical History:  Diagnosis Date  . Atrial fibrillation ([place]) 2011   Postop, spontaneous conversion to normal sinus after one hour  . Compression fracture 07/24/09   T12; kyphoplasty  . History of echocardiogram 5/11   EF 65%  . Hypertension   . Thyroid disease   . Tobacco abuse     Past Surgical History:  Procedure Laterality Date  . BACK SURGERY    . BACK SURGERY  06/06/2015  . BREAST EXCISIONAL BIOPSY Left    50 years ago  benign  . CHOLECYSTECTOMY N/A 04/25/2015   Procedure: LAPAROSCOPIC CHOLECYSTECTOMY WITH INTRAOPERATIVE CHOLANGIOGRAM;  Surgeon: Mickeal Skinner, MD;  Location: [facility];  Service: General;  Laterality: N/A;  . COLONOSCOPY N/A 11/26/2015   Procedure: COLONOSCOPY;  Surgeon: Daneil Dolin, MD;  [facility];  Service: Endoscopy;  Laterality: N/A;  7:30 am  . ERCP N/A 04/14/2015   Procedure: ENDOSCOPIC RETROGRADE CHOLANGIOPANCREATOGRAPHY (ERCP) Biliary Sphincterotomy, 10x7 stent placement Dilated bilary system just not well seen;  Surgeon: Rogene Houston, MD;  Location[facility];  Service: Endoscopy;  Laterality: N/A;  . ERCP N/A 06/12/2015   Procedure: ENDOSCOPIC RETROGRADE CHOLANGIOPANCREATOGRAPHY (ERCP);  Surgeon: Rogene Houston, MD;  [facility];  Service: Endoscopy;  Laterality: N/A;  .  ESOPHAGOGASTRODUODENOSCOPY N/A 06/12/2015   Procedure: DIAGNOSTIC ESOPHAGOGASTRODUODENOSCOPY (EGD);  Surgeon: Rogene Houston, MD;  [facility];  Service: Endoscopy;  Laterality: N/A;  . FEMUR IM NAIL Right 05/11/2019   Procedure: RETROGRADE INTRAMEDULLARY NAIL FEMORAL;  Surgeon: Shona Needles, MD;  Location: [facility];  Service: Orthopedics;  Laterality: Right;  . STENT REMOVAL  06/12/2015   Procedure: STENT REMOVAL ;  Surgeon: Rogene Houston, MD;  [facility];  Service: Endoscopy;;    Social History   Socioeconomic History  . Marital status: Widowed    Spouse name: Not on file  . Number of children: 2  . Years of education: Not on file  . Highest education level: Not on file  Occupational History  . Not on file  Tobacco Use  . Smoking status: Former Smoker    Packs/day: 0.15    Years: 20.00    Pack years: 3.00    Quit date: 03/08/2013    Years since quitting: 6.2  . Smokeless tobacco: Never Used  Substance and Sexual Activity  . Alcohol use: No    Alcohol/week: 0.0 standard drinks  . Drug use: No  . Sexual activity: Not Currently  Other Topics Concern  . Not on file  Social History Narrative   Active in gardens and does yard work.   Social Determinants of Health   Financial Resource Strain: Low Risk   . Difficulty of Paying Living Expenses: Not  hard at all  Food Insecurity: No Food Insecurity  . Worried About Charity fundraiser in the Last Year: Never true  . Ran Out of Food in the Last Year: Never true  Transportation Needs: No Transportation Needs  . Lack of Transportation (Medical): No  . Lack of Transportation (Non-Medical): No  Physical Activity: Inactive  . Days of Exercise per Week: 0 days  . Minutes of Exercise per Session: 0 min  Stress: No Stress Concern Present  . Feeling of Stress : Not at all  Social Connections:   . Frequency of Communication with Friends and Family:   . Frequency of Social Gatherings with Friends and Family:   .  Attends Religious Services:   . Active Member of Clubs or Organizations:   . Attends Archivist Meetings:   Marland Kitchen Marital Status:   Intimate Partner Violence: Not At Risk  . Fear of Current or Ex-Partner: No  . Emotionally Abused: No  . Physically Abused: No  . Sexually Abused: No   Family History  Problem Relation Age of Onset  . Heart attack Father   . Stroke Father   . Breast cancer Sister   . Thyroid disease Neg Hx   . Colon cancer Neg Hx     VITAL SIGNS BP 121/70   Pulse 82   Temp 98.4 F (36.9 C)   Resp 18   Ht 5\' 2"  (1.575 m)   Wt 144 lb (65.3 kg)   BMI 26.34 kg/m   Patient's Medications  New Prescriptions   No medications on file  Previous Medications   ALPRAZOLAM (XANAX) 0.5 MG TABLET    Take 1 tablet (0.5 mg total) by mouth at bedtime.   CARBOXYMETHYLCELLULOSE (REFRESH PLUS) 0.5 % SOLN    Place 1 drop into both eyes in the morning, at noon, in the evening, and at bedtime.   CHOLECALCIFEROL (VITAMIN D) 125 MCG (5000 UT) CAPS    Take 5,000 Units by mouth daily.   CYANOCOBALAMIN 2000 MCG TABLET    Take 2,000 mcg by mouth daily.   CYCLOBENZAPRINE (FLEXERIL) 5 MG TABLET    Take 1 tablet (5 mg total) by mouth 3 (three) times daily as needed for muscle spasms.   FEEDING SUPPLEMENT, ENSURE ENLIVE, (ENSURE ENLIVE) LIQD    Take 237 mLs by mouth daily.   HYDROCODONE-ACETAMINOPHEN (NORCO/VICODIN) 5-325 MG TABLET    Take 1 tablet by mouth every 12 (twelve) hours as needed for up to 7 days for severe pain.   LEVOTHYROXINE (SYNTHROID) 100 MCG TABLET    Take 1 tablet (100 mcg total) by mouth daily.   LIDOCAINE HCL-BENZYL ALCOHOL (SALONPAS LIDOCAINE PLUS) 4-10 % CREA    Apply 1 patch topically daily.   NON FORMULARY    Diet -Regular   OXYGEN    Inhale into the lungs. Oxygen @@ 2L/min via  to maintain sats >88%Special Instructions: Document O2 sat qshift. Every Shift Day, Evening, Night  Modified Medications   No medications on file  Discontinued Medications   No  medications on file     SIGNIFICANT DIAGNOSTIC EXAMS  PREVIOUS  05-10-19: chest x-ray: New compression fracture of T10 since 06/11/2015. No acute cardiopulmonary abnormalities  05-10-19: right femur x-ray: Displaced spiral fracture of the distal femoral diaphysis with valgus angulation and external rotation of the lower fracture fragment.  05-10-19: right knee x-ray: Displaced distal femoral fracture better assessed on dedicated femur radiographs performed concurrently. Trace knee effusion.  No additional fractures.  05-10-19: ct  of right femur:  1. Displaced spiral fracture of the distal third of the right femoral diaphysis with external rotation and valgus angulation of the distal fracture fragment. No dislocation. 2. Small suprapatellar effusion. 3. Partially visualized lucent area in the lateral aspect of the right sacral bone adjacent to the SI joint. This may represent degenerative changes and osteopenia although a lytic lesion is not excluded. MRI may provide better evaluation on a nonemergent/outpatient basis.   05-14-19: MRI of lumbar spine:  New but chronic appearing compression deformity of L4. Multilevel degenerative changes with some progression since remote study of 2011. Stenosis is greatest at L3-L4.  05-15-19: MRI of pelvis:  1. Expansile lesions of the right upper sacral ala, left upper sacrum, and right posterior acetabular wall. The larger right sacral lesion abuts and displaces the right sacral plexus. Top differential diagnostic considerations include myeloma and metastatic disease. 2. No other pelvic masses are identified. 3. Nonspecific presacral edema, increased compared to 05/01/2019. 4. Sigmoid colon diverticulosis.  NO NEW EXAMS  Review of Systems  Constitutional: Negative for malaise/fatigue.  Respiratory: Negative for cough and shortness of breath.   Cardiovascular: Negative for chest pain, palpitations and leg swelling.  Gastrointestinal: Negative for abdominal  pain, constipation and heartburn.  Musculoskeletal: Negative for back pain, joint pain and myalgias.  Skin: Negative.   Neurological: Negative for dizziness.  Psychiatric/Behavioral: The patient is not nervous/anxious.      Physical Exam Constitutional:      General: She is not in acute distress.    Appearance: She is well-developed. She is not diaphoretic.  Neck:     Thyroid: No thyromegaly.  Cardiovascular:     Rate and Rhythm: Normal rate and regular rhythm.     Pulses: Normal pulses.     Heart sounds: Normal heart sounds.  Pulmonary:     Effort: Pulmonary effort is normal. No respiratory distress.     Breath sounds: Normal breath sounds.  Abdominal:     General: Bowel sounds are normal. There is no distension.     Palpations: Abdomen is soft.     Tenderness: There is no abdominal tenderness.  Musculoskeletal:     Cervical back: Neck supple.     Right lower leg: No edema.     Left lower leg: No edema.     Comments: Is able to move all extremities  Is status right femur IM nail 05-11-19  Lymphadenopathy:     Cervical: No cervical adenopathy.  Skin:    General: Skin is warm and dry.  Neurological:     Mental Status: She is alert and oriented to person, place, and time.  Psychiatric:        Mood and Affect: Mood normal.       ASSESSMENT/ PLAN:  Patient is being discharged with the following home health services:  Pt/ot to evaluate and treat as indicated for gait balance strength adl training.   Patient is being discharged with the following durable medical equipment:  Front wheel walker to allow her to maintain her current level of independence with her adls.   Patient has been advised to f/u with their PCP in 1-2 weeks to bring them up to date on their rehab stay.  Social services at facility was responsible for arranging this appointment.  Pt was provided with a 30 day supply of prescriptions for medications and refills must be obtained from their PCP.  For  controlled substances, a more limited supply may be provided adequate until PCP appointment  only.  A 30 day supply of her prescription medications with #15 xanax 0.5 mg tabs and #5 vicodin 5/325 mg tabs to [facility]  Time spent with patient: 40 minutes: dme; medications home health.    Ok Edwards NP [facility]  Contact [phone] Monday through Friday 8am- 5pm  After hours call [phone]

## 2019-06-04 ENCOUNTER — Encounter (HOSPITAL_COMMUNITY): Payer: Self-pay | Admitting: *Deleted

## 2019-06-04 ENCOUNTER — Other Ambulatory Visit: Payer: Self-pay

## 2019-06-04 ENCOUNTER — Encounter (HOSPITAL_COMMUNITY)
Admission: RE | Admit: 2019-06-04 | Discharge: 2019-06-04 | Disposition: A | Discharge status: Home / Self Care | Payer: Medicare Other | Source: Ambulatory Visit | Attending: Hematology | Admitting: Hematology

## 2019-06-04 DIAGNOSIS — I700 Atherosclerosis of aorta: Secondary | ICD-10-CM | POA: Insufficient documentation

## 2019-06-04 DIAGNOSIS — C9000 Multiple myeloma not having achieved remission: Secondary | ICD-10-CM | POA: Diagnosis not present

## 2019-06-04 DIAGNOSIS — M899 Disorder of bone, unspecified: Secondary | ICD-10-CM

## 2019-06-04 DIAGNOSIS — I2510 Atherosclerotic heart disease of native coronary artery without angina pectoris: Secondary | ICD-10-CM | POA: Diagnosis not present

## 2019-06-04 MED ORDER — FLUDEOXYGLUCOSE F - 18 (FDG) INJECTION
8.4100 | Freq: Once | INTRAVENOUS | Status: AC | PRN
  Administered 2019-06-04: 8.41 via INTRAVENOUS

## 2019-06-05 ENCOUNTER — Other Ambulatory Visit (HOSPITAL_COMMUNITY): Payer: Self-pay | Admitting: *Deleted

## 2019-06-05 ENCOUNTER — Other Ambulatory Visit: Payer: Self-pay | Admitting: *Deleted

## 2019-06-05 DIAGNOSIS — S72301D Unspecified fracture of shaft of right femur, subsequent encounter for closed fracture with routine healing: Secondary | ICD-10-CM | POA: Diagnosis not present

## 2019-06-05 DIAGNOSIS — I10 Essential (primary) hypertension: Secondary | ICD-10-CM

## 2019-06-05 MED ORDER — HYDROCODONE-ACETAMINOPHEN 5-325 MG PO TABS: 1.0000 | ORAL_TABLET | Freq: Four times a day (QID) | ORAL | 0 refills | Status: DC | PRN

## 2019-06-05 NOTE — Patient Outreach (Signed)
[facility] Post- Acute Care Coordinator follow up  Verified in Patient Courtney Arnold that Courtney Arnold discharged from [facility] on 06/02/19.   Will make referral to [place]. Member discharged prior to writer's outreach.   Marthenia Rolling, MSN-Ed, RN,BSN [place] Acute Care Coordinator [phone] [facility]) [phone]  (Toll free office)

## 2019-06-06 DIAGNOSIS — Z23 Encounter for immunization: Secondary | ICD-10-CM | POA: Diagnosis not present

## 2019-06-07 ENCOUNTER — Inpatient Hospital Stay (HOSPITAL_COMMUNITY): Payer: Medicare Other | Attending: Hematology | Admitting: Hematology

## 2019-06-07 ENCOUNTER — Encounter (HOSPITAL_COMMUNITY): Payer: Self-pay | Admitting: Hematology

## 2019-06-07 ENCOUNTER — Other Ambulatory Visit: Payer: Self-pay

## 2019-06-07 VITALS — BP 133/53 | HR 89 | Temp 96.7°F | Resp 18

## 2019-06-07 DIAGNOSIS — Z803 Family history of malignant neoplasm of breast: Secondary | ICD-10-CM | POA: Insufficient documentation

## 2019-06-07 DIAGNOSIS — E079 Disorder of thyroid, unspecified: Secondary | ICD-10-CM | POA: Insufficient documentation

## 2019-06-07 DIAGNOSIS — Z87891 Personal history of nicotine dependence: Secondary | ICD-10-CM | POA: Diagnosis not present

## 2019-06-07 DIAGNOSIS — I10 Essential (primary) hypertension: Secondary | ICD-10-CM | POA: Insufficient documentation

## 2019-06-07 DIAGNOSIS — I4891 Unspecified atrial fibrillation: Secondary | ICD-10-CM | POA: Diagnosis not present

## 2019-06-07 DIAGNOSIS — E538 Deficiency of other specified B group vitamins: Secondary | ICD-10-CM | POA: Insufficient documentation

## 2019-06-07 DIAGNOSIS — Z7189 Other specified counseling: Secondary | ICD-10-CM | POA: Insufficient documentation

## 2019-06-07 DIAGNOSIS — Z5112 Encounter for antineoplastic immunotherapy: Secondary | ICD-10-CM | POA: Insufficient documentation

## 2019-06-07 DIAGNOSIS — D649 Anemia, unspecified: Secondary | ICD-10-CM | POA: Diagnosis not present

## 2019-06-07 DIAGNOSIS — C9000 Multiple myeloma not having achieved remission: Secondary | ICD-10-CM | POA: Insufficient documentation

## 2019-06-07 DIAGNOSIS — D6489 Other specified anemias: Secondary | ICD-10-CM | POA: Diagnosis not present

## 2019-06-07 LAB — CBC WITH DIFFERENTIAL/PLATELET
Abs Immature Granulocytes: 0.04 10*3/uL (ref 0.00–0.07)
Basophils Absolute: 0 10*3/uL (ref 0.0–0.1)
Basophils Relative: 0 %
Eosinophils Absolute: 0.3 10*3/uL (ref 0.0–0.5)
Eosinophils Relative: 4 %
HCT: 29.1 % — ABNORMAL LOW (ref 36.0–46.0)
Hemoglobin: 9.1 g/dL — ABNORMAL LOW (ref 12.0–15.0)
Immature Granulocytes: 1 %
Lymphocytes Relative: 20 %
Lymphs Abs: 1.2 10*3/uL (ref 0.7–4.0)
MCH: 29.2 pg (ref 26.0–34.0)
MCHC: 31.3 g/dL (ref 30.0–36.0)
MCV: 93.3 fL (ref 80.0–100.0)
Monocytes Absolute: 0.4 10*3/uL (ref 0.1–1.0)
Monocytes Relative: 7 %
Neutro Abs: 4.1 10*3/uL (ref 1.7–7.7)
Neutrophils Relative %: 68 %
Platelets: 193 10*3/uL (ref 150–400)
RBC: 3.12 MIL/uL — ABNORMAL LOW (ref 3.87–5.11)
RDW: 16.2 % — ABNORMAL HIGH (ref 11.5–15.5)
WBC: 6 10*3/uL (ref 4.0–10.5)
nRBC: 0 % (ref 0.0–0.2)

## 2019-06-07 NOTE — Progress Notes (Signed)
INDICATION: Plasma cell myeloma.   Bone Marrow Biopsy and Aspiration Procedure Note   The patient was identified by name and date of birth, prior to start of the procedure and a timeout was performed.   An informed consent was obtained after discussing potential risks including bleeding, infection and pain.  The right posterior iliac crest was palpated, cleaned with ChloraPrep, and drapes applied.  1% lidocaine is infiltrated into the skin, subcutaneous tissue and periosteum.  Bone marrow was aspirated and smears made.  With the help of Jamshidi needle a core biopsy was obtained.  Pressure was applied to the biopsy site and bandage was placed over the biopsy site. Patient was made to lie on the back for 15 mins prior to discharge.  The procedure was tolerated well. COMPLICATIONS: None BLOOD LOSS: none Patient was discharged home in stable condition to return in 2 weeks to review results.  Patient was provided with post bone marrow biopsy instructions and instructed to call if there was any bleeding or worsening pain.  Specimens sent for flow cytometry, cytogenetics and additional studies.  Signed Derek Jack, MD

## 2019-06-07 NOTE — Progress Notes (Signed)
Patient here today for bone marrow biopsy. Procedure explained and consent signed by all parties at 0915. Patient placed in prone position with both arms above head. Time out conducted at [phone] all parties agreed. Procedure started at 0920. Patient tolerated procedure well with minimal pain and discomfort. Specimens collected and labeled appropriately. Procedure completed at [phone]. Dressing applied and patient reposition on back, sitting up, resting at 0940. Specimens taken to lab for processing. Patient stable and discharged home [phone].

## 2019-06-07 NOTE — Progress Notes (Signed)
START ON PATHWAY REGIMEN - Multiple Myeloma and Other Plasma Cell Dyscrasias     A cycle is every 21 days:     Bortezomib      Lenalidomide      Dexamethasone   **Always confirm dose/schedule in your pharmacy ordering system**  Patient Characteristics: Multiple Myeloma, Newly Diagnosed, Transplant Ineligible or Refused, High Risk Disease Classification: Multiple Myeloma R-ISS Staging: Unknown Therapeutic Status: Newly Diagnosed Is Patient Eligible for Transplant<= Transplant Ineligible or Refused Risk Status: High Risk Intent of Therapy: Non-Curative / Palliative Intent, Discussed with Patient

## 2019-06-07 NOTE — Progress Notes (Signed)
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  CURRENT THERAPY: RVD.   INTERVAL HISTORY:  Ms. Walloch 80 y.o. female seen for follow-up of plasma cell myeloma.  She has some pain in the right femur region.  Denies any other pains.  Denies any fevers, night sweats or weight loss.  Doing home physical therapy.    REVIEW OF SYSTEMS:  Review of Systems  Musculoskeletal: Positive for back pain.       Right femur pain.  All other systems reviewed and are negative.    PAST MEDICAL/SURGICAL HISTORY:  Past Medical History:  Diagnosis Date  . Atrial fibrillation ([place]) 2011   Postop, spontaneous conversion to normal sinus after one hour  . Compression fracture 07/24/09   T12; kyphoplasty  . History of echocardiogram 5/11   EF 65%  . Hypertension   . Thyroid disease   . Tobacco abuse    Past Surgical History:  Procedure Laterality Date  . BACK SURGERY    . BACK SURGERY  06/06/2015  . BREAST EXCISIONAL BIOPSY Left    50 years ago  benign  . CHOLECYSTECTOMY N/A 04/25/2015   Procedure: LAPAROSCOPIC CHOLECYSTECTOMY WITH INTRAOPERATIVE CHOLANGIOGRAM;  Surgeon: Mickeal Skinner, MD;  Location: [facility];  Service: General;  Laterality: N/A;  . COLONOSCOPY N/A 11/26/2015   Procedure: COLONOSCOPY;  Surgeon: Daneil Dolin, MD;  Location[facility];  Service: Endoscopy;  Laterality: N/A;  7:30 am  . ERCP N/A 04/14/2015   Procedure: ENDOSCOPIC RETROGRADE CHOLANGIOPANCREATOGRAPHY (ERCP) Biliary Sphincterotomy, 10x7 stent placement Dilated bilary system just not well seen;  Surgeon: Rogene Houston, MD;  Location: [facility];  Service: Endoscopy;  Laterality: N/A;  . ERCP N/A 06/12/2015   Procedure: ENDOSCOPIC RETROGRADE CHOLANGIOPANCREATOGRAPHY (ERCP);  Surgeon: Rogene Houston, MD;   [facility];  Service: Endoscopy;  Laterality: N/A;  . ESOPHAGOGASTRODUODENOSCOPY N/A 06/12/2015   Procedure: DIAGNOSTIC ESOPHAGOGASTRODUODENOSCOPY (EGD);  Surgeon: Rogene Houston, MD;  [facility];  Service: Endoscopy;  Laterality: N/A;  . FEMUR IM NAIL Right 05/11/2019   Procedure: RETROGRADE INTRAMEDULLARY NAIL FEMORAL;  Surgeon: Shona Needles, MD;  Location: [facility];  Service: Orthopedics;  Laterality: Right;  . STENT REMOVAL  06/12/2015   Procedure: STENT REMOVAL ;  Surgeon: Rogene Houston, MD;  Location[facility];  Service: Endoscopy;;     SOCIAL HISTORY:  Social History   Socioeconomic History  . Marital status: Widowed    Spouse name: Not on file  . Number of children: 2  . Years of education: Not on file  . Highest education level: Not on file  Occupational History  . Not on file  Tobacco Use  . Smoking status: Former Smoker    Packs/day: 0.15    Years: 20.00    Pack years: 3.00    Quit date: 03/08/2013    Years since quitting: 6.2  . Smokeless tobacco: Never Used  Substance and Sexual Activity  . Alcohol use: No    Alcohol/week: 0.0 standard drinks  . Drug use: No  . Sexual activity: Not Currently  Other Topics Concern  . Not on file  Social History Narrative   Active in gardens and does yard work.   Social Determinants of Health   Financial Resource Strain: Low Risk   .  Difficulty of Paying Living Expenses: Not hard at all  Food Insecurity: No Food Insecurity  . Worried About Charity fundraiser in the Last Year: Never true  . Ran Out of Food in the Last Year: Never true  Transportation Needs: No Transportation Needs  . Lack of Transportation (Medical): No  . Lack of Transportation (Non-Medical): No  Physical Activity: Inactive  . Days of Exercise per Week: 0 days  . Minutes of Exercise per Session: 0 min  Stress: No Stress Concern Present  . Feeling of Stress : Not at all  Social Connections:   . Frequency of Communication with  Friends and Family:   . Frequency of Social Gatherings with Friends and Family:   . Attends Religious Services:   . Active Member of Clubs or Organizations:   . Attends Archivist Meetings:   Marland Kitchen Marital Status:   Intimate Partner Violence: Not At Risk  . Fear of Current or Ex-Partner: No  . Emotionally Abused: No  . Physically Abused: No  . Sexually Abused: No    FAMILY HISTORY:  Family History  Problem Relation Age of Onset  . Heart attack Father   . Stroke Father   . Breast cancer Sister   . Thyroid disease Neg Hx   . Colon cancer Neg Hx     CURRENT MEDICATIONS:  Outpatient Encounter Medications as of 06/07/2019  Medication Sig  . ALPRAZolam (XANAX) 0.5 MG tablet Take 1 tablet (0.5 mg total) by mouth at bedtime.  . carboxymethylcellulose (REFRESH PLUS) 0.5 % SOLN Place 1 drop into both eyes in the morning, at noon, in the evening, and at bedtime.  . Cholecalciferol (VITAMIN D) 125 MCG (5000 UT) CAPS Take 5,000 Units by mouth daily.  . cyanocobalamin 2000 MCG tablet Take 2,000 mcg by mouth daily.  . cyclobenzaprine (FLEXERIL) 5 MG tablet Take 1 tablet (5 mg total) by mouth 3 (three) times daily as needed for muscle spasms.  . feeding supplement, ENSURE ENLIVE, (ENSURE ENLIVE) LIQD Take 237 mLs by mouth daily.  Marland Kitchen HYDROcodone-acetaminophen (NORCO/VICODIN) 5-325 MG tablet Take 1 tablet by mouth every 6 (six) hours as needed for severe pain.  Marland Kitchen levothyroxine (SYNTHROID) 100 MCG tablet Take 1 tablet (100 mcg total) by mouth daily.  . Lidocaine HCl-Benzyl Alcohol (SALONPAS LIDOCAINE PLUS) 4-10 % CREA Apply 1 patch topically daily.  . NON FORMULARY Diet -Regular  . OXYGEN Inhale into the lungs. Oxygen @@ 2L/min via [place] to maintain sats >88%Special Instructions: Document O2 sat qshift. Every Shift Day, Evening, Night   No facility-administered encounter medications on file as of 06/07/2019.    ALLERGIES:  No Known Allergies   PHYSICAL EXAM:  ECOG Performance status:  2  Vitals:   06/07/19 0900  BP: (!) 133/53  Pulse: 89  Resp: 18  Temp: (!) 96.7 F (35.9 C)  SpO2: 100%   There were no vitals filed for this visit.  Physical Exam Vitals reviewed.  Constitutional:      Appearance: Normal appearance.  Cardiovascular:     Rate and Rhythm: Normal rate and regular rhythm.     Heart sounds: Normal heart sounds.  Pulmonary:     Effort: Pulmonary effort is normal.     Breath sounds: Normal breath sounds.  Abdominal:     General: There is no distension.     Palpations: Abdomen is soft.  Skin:    General: Skin is warm.  Neurological:     General: No focal deficit present.  Mental Status: She is alert and oriented to person, place, and time.  Psychiatric:        Mood and Affect: Mood normal.        Behavior: Behavior normal.      LABORATORY DATA:  I have reviewed the labs as listed.  CBC    Component Value Date/Time   WBC 6.0 06/07/2019 0958   RBC 3.12 (L) 06/07/2019 0958   HGB 9.1 (L) 06/07/2019 0958   HCT 29.1 (L) 06/07/2019 0958   PLT 193 06/07/2019 0958   MCV 93.3 06/07/2019 0958   MCH 29.2 06/07/2019 0958   MCHC 31.3 06/07/2019 0958   RDW 16.2 (H) 06/07/2019 0958   LYMPHSABS 1.2 06/07/2019 0958   MONOABS 0.4 06/07/2019 0958   EOSABS 0.3 06/07/2019 0958   BASOSABS 0.0 06/07/2019 0958   CMP Latest Ref Rng & Units 05/28/2019 05/24/2019 05/18/2019  Glucose 70 - 99 mg/dL - 102(H) 103(H)  BUN 8 - 23 mg/dL - 15 14  Creatinine 0.44 - 1.00 mg/dL - 0.52 0.58  Sodium 135 - 145 mmol/L - 137 137  Potassium 3.5 - 5.1 mmol/L 3.5 2.9(L) 3.4(L)  Chloride 98 - 111 mmol/L - 95(L) 89(L)  CO2 22 - 32 mmol/L - 33(H) 34(H)  Calcium 8.9 - 10.3 mg/dL - 8.9 8.7(L)  Total Protein 6.5 - 8.1 g/dL - - -  Total Bilirubin 0.3 - 1.2 mg/dL - - -  Alkaline Phos 38 - 126 U/L - - -  AST 15 - 41 U/L - - -  ALT 0 - 44 U/L - - -       DIAGNOSTIC IMAGING:  I have reviewed the scans.    ASSESSMENT & PLAN:   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HCC) 1.  IgA  kappa plasma cell myeloma: -Right sacral bone lesion biopsy on 05/17/2019 showed kappa restricted plasma cell neoplasm. -SPEP did not show any identifiable M spike.  Immunofixation shows IgA kappa monoclonal protein.  Kappa light chains-42.8, lambda light chains-9.9, ratio 4.32. -PET scan on 06/04/2019 shows expansile lytic lesion involving the right sacral wing.  Lytic lesion involving T10 vertebral body.  Left seventh rib lesion.  Increased uptake in the pathological fracture involving distal aspect of the right femur. -I have recommended bone marrow aspiration and biopsy.  We will send myeloma FISH panel and chromosome analysis. -24-hour urine was positive for Bence-Jones proteins.  LDH was normal. -We will check her beta-2 microglobulin for staging.  We will plan to start her on RVD based regimen.  2.  Normocytic anemia: -CBC today shows hemoglobin 9.1.  MCV was normal.  Normal B12 with elevated methylmalonic acid.  Ferritin was 64 and percent saturation of 9.  Folic acid was normal.  Stool for occult blood was negative in ER.  3.  Right femur fracture: -She is continuing home physical therapy.  She is walking with help of walker.  4.  ID prophylaxis: -I have recommended acyclovir 400 mg twice daily and aspirin 81 mg for thromboprophylaxis.      Orders placed this encounter:  Orders Placed This Encounter  Procedures  . CBC with Differential  . Beta 2 microglobulin, serum     Derek Jack, MD [place] [phone]

## 2019-06-07 NOTE — Assessment & Plan Note (Addendum)
1.  IgA kappa plasma cell myeloma: -Right sacral bone lesion biopsy on 05/17/2019 showed kappa restricted plasma cell neoplasm. -SPEP did not show any identifiable M spike.  Immunofixation shows IgA kappa monoclonal protein.  Kappa light chains-42.8, lambda light chains-9.9, ratio 4.32. -PET scan on 06/04/2019 shows expansile lytic lesion involving the right sacral wing.  Lytic lesion involving T10 vertebral body.  Left seventh rib lesion.  Increased uptake in the pathological fracture involving distal aspect of the right femur. -FISH panel from the biopsy shows 1 q. 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 -I have recommended bone marrow aspiration and biopsy.  We will send myeloma FISH panel and chromosome analysis. -24-hour urine was positive for Bence-Jones proteins.  LDH was normal. -We will check her beta-2 microglobulin for staging.  We will plan to start her on RVD based regimen.  2.  Normocytic anemia: -CBC today shows hemoglobin 9.1.  MCV was normal.  Normal B12 with elevated methylmalonic acid.  Ferritin was 64 and percent saturation of 9.  Folic acid was normal.  Stool for occult blood was negative in ER.  3.  Right femur fracture: -She is continuing home physical therapy.  She is walking with help of walker.  4.  ID prophylaxis: -I have recommended acyclovir 400 mg twice daily and aspirin 81 mg for thromboprophylaxis.

## 2019-06-07 NOTE — Patient Instructions (Signed)
Courtney Arnold at [facility] Discharge Instructions  You were seen today by Dr. Delton Coombes. You had a bone marrow biopsy today. He will see you back as scheduled for follow up.   Bone Marrow Aspiration and Bone Marrow Biopsy, Adult, Care After This sheet gives you information about how to care for yourself after your procedure. Your health care provider may also give you more specific instructions. If you have problems or questions, contact your health care provider. What can I expect after the procedure? After the procedure, it is common to have:  Mild pain and tenderness.  Swelling.  Bruising. Follow these instructions at home: Puncture site care   Follow instructions from your health care provider about how to take care of the puncture site. Make sure you: ? Wash your hands with soap and water before and after you change your bandage (dressing). If soap and water are not available, use hand sanitizer. ? Change your dressing as told by your health care provider.  Check your puncture site every day for signs of infection. Check for: ? More redness, swelling, or pain. ? Fluid or blood. ? Warmth. ? Pus or a bad smell. Activity  Return to your normal activities as told by your health care provider. Ask your health care provider what activities are safe for you.  Do not lift anything that is heavier than 10 lb (4.5 kg), or the limit that you are told, until your health care provider says that it is safe.  Do not drive for 24 hours if you were given a sedative during your procedure. General instructions   Take over-the-counter and prescription medicines only as told by your health care provider.  Do not take baths, swim, or use a hot tub until your health care provider approves. Ask your health care provider if you may take showers. You may only be allowed to take sponge baths.  If directed, put ice on the affected area. To do this: ? Put ice in a plastic  bag. ? Place a towel between your skin and the bag. ? Leave the ice on for 20 minutes, 2-3 times a day.  Keep all follow-up visits as told by your health care provider. This is important. Contact a health care provider if:  Your pain is not controlled with medicine.  You have a fever.  You have more redness, swelling, or pain around the puncture site.  You have fluid or blood coming from the puncture site.  Your puncture site feels warm to the touch.  You have pus or a bad smell coming from the puncture site. Summary  After the procedure, it is common to have mild pain, tenderness, swelling, and bruising.  Follow instructions from your health care provider about how to take care of the puncture site and what activities are safe for you.  Take over-the-counter and prescription medicines only as told by your health care provider.  Contact a health care provider if you have any signs of infection, such as fluid or blood coming from the puncture site. This information is not intended to replace advice given to you by your health care provider. Make sure you discuss any questions you have with your health care provider. Document Revised: 07/11/2018 Document Reviewed: 07/11/2018 Elsevier Patient Education  2020 [employer].   Thank you for choosing [place] at [facility] to provide your oncology and hematology care.  To afford each patient quality time with our provider, please  arrive at least 15 minutes before your scheduled appointment time.   If you have a lab appointment with the [facility] please come in thru the  Main Entrance and check in at the main information desk  You need to re-schedule your appointment should you arrive 10 or more minutes late.  We strive to give you quality time with our providers, and arriving late affects you and other patients whose appointments are after yours.  Also, if you no show three or more times for appointments  you may be dismissed from the clinic at the providers discretion.     Again, thank you for choosing [facility].  Our hope is that these requests will decrease the amount of time that you wait before being seen by our physicians.       _____________________________________________________________  Should you have questions after your visit to [facility], please contact our office at [phone] between the hours of 8:00 a.m. and 4:30 p.m.  Voicemails left after 4:00 p.m. will not be returned until the following business day.  For prescription refill requests, have your pharmacy contact our office and allow 72 hours.    Cancer Center Support Programs:   > Cancer Support Group  2nd Tuesday of the month 1pm-2pm, Journey Room

## 2019-06-11 ENCOUNTER — Other Ambulatory Visit: Payer: Self-pay | Admitting: *Deleted

## 2019-06-11 DIAGNOSIS — I4891 Unspecified atrial fibrillation: Secondary | ICD-10-CM | POA: Diagnosis not present

## 2019-06-11 DIAGNOSIS — E039 Hypothyroidism, unspecified: Secondary | ICD-10-CM | POA: Diagnosis not present

## 2019-06-11 DIAGNOSIS — S72301D Unspecified fracture of shaft of right femur, subsequent encounter for closed fracture with routine healing: Secondary | ICD-10-CM | POA: Diagnosis not present

## 2019-06-11 DIAGNOSIS — J9601 Acute respiratory failure with hypoxia: Secondary | ICD-10-CM | POA: Diagnosis not present

## 2019-06-11 DIAGNOSIS — G8929 Other chronic pain: Secondary | ICD-10-CM | POA: Diagnosis not present

## 2019-06-11 DIAGNOSIS — M545 Low back pain: Secondary | ICD-10-CM | POA: Diagnosis not present

## 2019-06-11 LAB — SURGICAL PATHOLOGY

## 2019-06-11 NOTE — Patient Outreach (Signed)
[facility]) Care Management  06/11/2019  Courtney Arnold [birth year] [id number]   [facility] Transition of Care Referral   Referral Date: 06/05/19 Referral Source: Marthenia Rolling, [facility] post acute care coordinator Date of Admission:   05/21/19 Reason for consult Please assign to [facility] RNCM   Diagnoses A FIB, HTN, femur fracture   Please assign to [place]. Discharged from [facility] SNF on Saturday 06/02/19 prior to writer's outreach. Lives alone. Home health arranged.   Insurance: [employer] Medicare    Outreach attempt # 1 Vivien Rota her daughter answered and reports  home health [facility])  Occupational therapy (OT) Was working with the patient at this time. [facility] RN CM left HIPAA [facility] Portability and Accountability Act) compliant voicemail message along with CM's contact info.   Plan: [facility] RN CM sent an unsuccessful outreach letter and scheduled this patient for another call attempt within 4 business days  Zylan Almquist L. Lavina Hamman, RN, BSN, [place] Management Care Coordinator Direct Number [phone] Mobile number [phone]  Main [facility] number [phone] Fax number [phone]

## 2019-06-13 DIAGNOSIS — G8929 Other chronic pain: Secondary | ICD-10-CM | POA: Diagnosis not present

## 2019-06-13 DIAGNOSIS — I4891 Unspecified atrial fibrillation: Secondary | ICD-10-CM | POA: Diagnosis not present

## 2019-06-13 DIAGNOSIS — M545 Low back pain: Secondary | ICD-10-CM | POA: Diagnosis not present

## 2019-06-13 DIAGNOSIS — S72301D Unspecified fracture of shaft of right femur, subsequent encounter for closed fracture with routine healing: Secondary | ICD-10-CM | POA: Diagnosis not present

## 2019-06-13 DIAGNOSIS — J9601 Acute respiratory failure with hypoxia: Secondary | ICD-10-CM | POA: Diagnosis not present

## 2019-06-13 DIAGNOSIS — E039 Hypothyroidism, unspecified: Secondary | ICD-10-CM | POA: Diagnosis not present

## 2019-06-14 ENCOUNTER — Other Ambulatory Visit: Payer: Self-pay | Admitting: *Deleted

## 2019-06-14 ENCOUNTER — Other Ambulatory Visit: Payer: Self-pay

## 2019-06-14 ENCOUNTER — Encounter: Payer: Self-pay | Admitting: *Deleted

## 2019-06-14 NOTE — Patient Outreach (Signed)
[facility]) Care Management  06/14/2019  Courtney Arnold 1940/11/24 [id number]   [facility] Transition of Care Referral  Referral Date: 06/05/19 Referral Source: Courtney Arnold, [facility] post acute care coordinator [place] [facility] Date of Admission:   05/21/19 for a displaced spiral fracture of shaft of right femur  Reason for consult Please assign to [facility] RN CM   Diagnoses A FIB, HTN, femur fracture   Please assign to [facility] RN CM. Discharged from [facility] on Saturday 06/02/19 prior to writer's outreach. Lives alone. Home health arranged.   Insurance: NextGen Medicare Last admission at [facility] [place] 05/10/19- 05/21/19 for fall displaced spiral fracture of shaft of right femur 05/03/19 ED for abdominal pain  05/01/19 ED for abdominal pain,anemia, thrombocytopenia   Outreach attempt # 2 successful to the designated number in [place] which is her daughter, Courtney Arnold's number Courtney Arnold her daughter answered  She is able to verify HIPAA (Courtney Arnold) identifiers, date of birth (DOB) and address Reviewed and addressed referral to [facility] with patient Courtney Arnold reports her mother generally has to be assist with her "business"/ IADLs (instrumental Activities of Daily Living) needs at this time  Transition of care from snf Courtney Arnold reports Courtney Arnold home therapies have begun She has been evaluated by  home health [facility])  Occupational therapy (OT) and informed OT did not have to return. home health [facility]) Physical therapy (PT) will be returning  Medication concerns are denied Her appetite is fair but is taking in premier protein drinks daily and is monitoring for sugar intake No pcp follow up has been obtained at this time but planning a visit in the future. Courtney Delanna Ahmadi NP/PA Courtney Arnold at , primary care provider (PCP) was seen prior to her admission She has seen the surgeon and her oncologist twice. Her leg is swelling and the MDs are aware and  monitoring. She recently had a biopsy She ws seen by Courtney Every, RN during a visit with her oncologist She received her final covid shot on last week   Social Courtney Arnold is a retired widow who lives at home alone. She is independent/assist with her care needs Her family provides transportation to medical appointments and provide  IADLs (instrumental Activities of Daily Living) needs. She has support of her daughters, Courtney Arnold and Courtney Arnold.  Prior to her fall she was active in her garden    Conditions 05/10/19 fall displaced spiral fracture of shaft of right femur, compression fracture of L2 lumbar vertebra, Atrial fibrillation, newly diagnosed IgA kappa plasma cell multiple myeloma (ineligible or refused transplant, high risk, 21 day cycle of bortezomib, lenalidomide, dexamethasone, 05/17/19 right sacral bone lesion biopsy, hypothyroidism, anxiety, anemia, sacral lytic lesions, back surgery 06/06/2015, tobacco abuse, Falls 05/10/19 resulting in fracture of right femur, former smoker  DME  Appointments Primary care MD Courtney Arnold seen during the week of 05/07/19 prior to 05/10/19 admission 06/21/19 0900 [place] labs 1000 office visit oncology Courtney Arnold, seen on 06/07/19 for bone marrow biopsy 02/19/20 1345  Endocrinology annual follow up Courtney Arnold  Advance directives Her daughter is her POA  Plan: [facility] RN CM follow up with Courtney Wafer and her daughter Courtney Arnold within the next 14-21 business days Pt encouraged to return a call to [facility] RN CM prn [facility] RN CM sent a Gaffer with [facility] brochure, [employer], [facility] consent form with return envelope and know before you go sheet enclosed for review  [facility] RN  CM discussed the outreach letter already sent on with [facility] brochure enclosed for review   Routed note to MDs/NP/PA   [facility] CM Care Plan Problem One     Most Recent Value  Care Plan Problem One  knowledge deficit of home care for right femur fracture,myeloma, atrial fibrillation  Role Documenting  the Problem One  Care Management Telephonic Coordinator  Care Plan for Problem One  Active  [facility] Long Term Goal   over the next 90 days patient will be able to verbalize with outreach interventions to manage femur fracture and myeloma at  home  [place] Start Date  06/14/19  Interventions for Problem One Long Term Goal  transition of care assessment, assessed for worsenig symptoms  [facility] CM Short Term Goal #1   over the next 31 days patient will follow up with primary care MD as evidence by verbalization of completed visit   [facility] CM Short Term Goal #1 Start Date  06/14/19  Interventions for Short Term Goal #1  discussed pcp post hospital visit importance encouraged pcp visit       Courtney Arnold L. Lavina Hamman, RN, BSN, [place] Management Care Coordinator Direct Number [phone] Mobile number [phone]  Main [facility] number [phone] Fax number [phone]

## 2019-06-15 ENCOUNTER — Other Ambulatory Visit (HOSPITAL_COMMUNITY): Payer: Self-pay | Admitting: *Deleted

## 2019-06-15 MED ORDER — LACTULOSE 20 GM/30ML PO SOLN: 20.0000 g | ORAL | 1 refills | Status: DC

## 2019-06-15 NOTE — Telephone Encounter (Signed)
Patient's daughter called today stating that this patient has been since last Saturday with no BM. She has taken miralax, dulcolax, senna-cot, castro oil, magnesium citrate. She is feeling full but still not passing any stools.  Orders received from Francene Finders, NP for her to take lactulose every 3 hours until she produces a bowel movement.  Daughter aware of directions and verbalizes understanding.

## 2019-06-18 DIAGNOSIS — I4891 Unspecified atrial fibrillation: Secondary | ICD-10-CM | POA: Diagnosis not present

## 2019-06-18 DIAGNOSIS — S72301D Unspecified fracture of shaft of right femur, subsequent encounter for closed fracture with routine healing: Secondary | ICD-10-CM | POA: Diagnosis not present

## 2019-06-18 DIAGNOSIS — E039 Hypothyroidism, unspecified: Secondary | ICD-10-CM | POA: Diagnosis not present

## 2019-06-18 DIAGNOSIS — M545 Low back pain: Secondary | ICD-10-CM | POA: Diagnosis not present

## 2019-06-18 DIAGNOSIS — J9601 Acute respiratory failure with hypoxia: Secondary | ICD-10-CM | POA: Diagnosis not present

## 2019-06-18 DIAGNOSIS — G8929 Other chronic pain: Secondary | ICD-10-CM | POA: Diagnosis not present

## 2019-06-20 ENCOUNTER — Encounter (HOSPITAL_COMMUNITY): Payer: Self-pay | Admitting: Hematology

## 2019-06-21 ENCOUNTER — Other Ambulatory Visit (HOSPITAL_COMMUNITY): Payer: Self-pay | Admitting: *Deleted

## 2019-06-21 ENCOUNTER — Other Ambulatory Visit: Payer: Self-pay

## 2019-06-21 ENCOUNTER — Inpatient Hospital Stay (HOSPITAL_BASED_OUTPATIENT_CLINIC_OR_DEPARTMENT_OTHER): Payer: Medicare Other | Admitting: Hematology

## 2019-06-21 ENCOUNTER — Encounter (HOSPITAL_COMMUNITY): Payer: Self-pay | Admitting: Hematology

## 2019-06-21 ENCOUNTER — Inpatient Hospital Stay (HOSPITAL_COMMUNITY): Payer: Medicare Other

## 2019-06-21 DIAGNOSIS — E538 Deficiency of other specified B group vitamins: Secondary | ICD-10-CM | POA: Diagnosis not present

## 2019-06-21 DIAGNOSIS — I4891 Unspecified atrial fibrillation: Secondary | ICD-10-CM | POA: Diagnosis not present

## 2019-06-21 DIAGNOSIS — C9000 Multiple myeloma not having achieved remission: Secondary | ICD-10-CM

## 2019-06-21 DIAGNOSIS — D649 Anemia, unspecified: Secondary | ICD-10-CM | POA: Diagnosis not present

## 2019-06-21 DIAGNOSIS — I10 Essential (primary) hypertension: Secondary | ICD-10-CM | POA: Diagnosis not present

## 2019-06-21 DIAGNOSIS — Z5112 Encounter for antineoplastic immunotherapy: Secondary | ICD-10-CM | POA: Diagnosis not present

## 2019-06-21 MED ORDER — DEXAMETHASONE 4 MG PO TABS: ORAL_TABLET | ORAL | 3 refills | Status: DC

## 2019-06-21 MED ORDER — PROCHLORPERAZINE MALEATE 10 MG PO TABS: 10.0000 mg | ORAL_TABLET | Freq: Four times a day (QID) | ORAL | 1 refills | Status: DC | PRN

## 2019-06-21 MED ORDER — LENALIDOMIDE 15 MG PO CAPS: ORAL_CAPSULE | ORAL | 0 refills | Status: DC

## 2019-06-21 MED ORDER — HYDROCODONE-ACETAMINOPHEN 5-325 MG PO TABS: 1.0000 | ORAL_TABLET | Freq: Four times a day (QID) | ORAL | 0 refills | Status: DC | PRN

## 2019-06-21 MED ORDER — ACYCLOVIR 400 MG PO TABS: 400.0000 mg | ORAL_TABLET | Freq: Two times a day (BID) | ORAL | 3 refills | Status: DC

## 2019-06-21 NOTE — Progress Notes (Signed)
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  CURRENT THERAPY: RVD.   INTERVAL HISTORY:  [name] 79 y.o. female seen for follow-up of plasma cell myeloma.  She had a bone marrow biopsy done 2 weeks ago.  Reports left posterior rib pain.  Appetite and energy levels are 100%.  Occasional constipation present.  Pain is fairly controlled on this regimen.  Right femur pain is improving.    REVIEW OF SYSTEMS:  Review of Systems  Musculoskeletal: Positive for back pain.       Right femur pain.  All other systems reviewed and are negative.    PAST MEDICAL/SURGICAL HISTORY:  Past Medical History:  Diagnosis Date  . Atrial fibrillation ([facility]) 2011   Postop, spontaneous conversion to normal sinus after one hour  . Compression fracture 07/24/09   T12; kyphoplasty  . History of echocardiogram 5/11   EF 65%  . Hypertension   . Thyroid disease   . Tobacco abuse    Past Surgical History:  Procedure Laterality Date  . BACK SURGERY    . BACK SURGERY  06/06/2015  . BREAST EXCISIONAL BIOPSY Left    50 years ago  benign  . CHOLECYSTECTOMY N/A 04/25/2015   Procedure: LAPAROSCOPIC CHOLECYSTECTOMY WITH INTRAOPERATIVE CHOLANGIOGRAM;  Surgeon: Courtney Skinner, MD;  Location: [facility];  Service: General;  Laterality: N/A;  . COLONOSCOPY N/A 11/26/2015   Procedure: COLONOSCOPY;  Surgeon: Courtney Dolin, MD;  Location[facility];  Service: Endoscopy;  Laterality: N/A;  7:30 am  . ERCP N/A 04/14/2015   Procedure: ENDOSCOPIC RETROGRADE CHOLANGIOPANCREATOGRAPHY (ERCP) Biliary Sphincterotomy, 10x7 stent placement Dilated bilary system just not well seen;  Surgeon: Courtney Houston, MD;  Location: [facility];  Service: Endoscopy;  Laterality: N/A;  . ERCP N/A 06/12/2015   Procedure: ENDOSCOPIC  RETROGRADE CHOLANGIOPANCREATOGRAPHY (ERCP);  Surgeon: Courtney Houston, MD;  [facility];  Service: Endoscopy;  Laterality: N/A;  . ESOPHAGOGASTRODUODENOSCOPY N/A 06/12/2015   Procedure: DIAGNOSTIC ESOPHAGOGASTRODUODENOSCOPY (EGD);  Surgeon: Courtney Houston, MD;  [facility];  Service: Endoscopy;  Laterality: N/A;  . FEMUR IM NAIL Right 05/11/2019   Procedure: RETROGRADE INTRAMEDULLARY NAIL FEMORAL;  Surgeon: Courtney Needles, MD;  Location: [facility];  Service: Orthopedics;  Laterality: Right;  . STENT REMOVAL  06/12/2015   Procedure: STENT REMOVAL ;  Surgeon: Courtney Houston, MD;  Location: [facility];  Service: Endoscopy;;     SOCIAL HISTORY:  Social History   Socioeconomic History  . Marital status: Widowed    Spouse name: Not on file  . Number of children: 2  . Years of education: Not on file  . Highest education level: Not on file  Occupational History  . Occupation: retired  Tobacco Use  . Smoking status: Former Smoker    Packs/day: 0.15    Years: 20.00    Pack years: 3.00    Quit date: 03/08/2013    Years since quitting: 6.2  . Smokeless tobacco: Never Used  Substance and Sexual Activity  . Alcohol use: No    Alcohol/week: 0.0 standard drinks  . Drug use: No  . Sexual activity: Not Currently  Other Topics Concern  . Not on file  Social History Narrative   Active in gardens and does yard work.  Social Determinants of Health   Financial Resource Strain: Low Risk   . Difficulty of Paying Living Expenses: Not hard at all  Food Insecurity: No Food Insecurity  . Worried About Charity fundraiser in the Last Year: Never true  . Ran Out of Food in the Last Year: Never true  Transportation Needs: No Transportation Needs  . Lack of Transportation (Medical): No  . Lack of Transportation (Non-Medical): No  Physical Activity: Inactive  . Days of Exercise per Week: 0 days  . Minutes of Exercise per Session: 0 min  Stress: No Stress Concern Present  .  Feeling of Stress : Not at all  Social Connections:   . Frequency of Communication with Friends and Family:   . Frequency of Social Gatherings with Friends and Family:   . Attends Religious Services:   . Active Member of Clubs or Organizations:   . Attends Archivist Meetings:   Marland Kitchen Marital Status:   Intimate Partner Violence: Not At Risk  . Fear of Current or Ex-Partner: No  . Emotionally Abused: No  . Physically Abused: No  . Sexually Abused: No    FAMILY HISTORY:  Family History  Problem Relation Age of Onset  . Heart attack Father   . Stroke Father   . Breast cancer Sister   . Thyroid disease Neg Hx   . Colon cancer Neg Hx     CURRENT MEDICATIONS:  Outpatient Encounter Medications as of 06/21/2019  Medication Sig  . carboxymethylcellulose (REFRESH PLUS) 0.5 % SOLN Place 1 drop into both eyes in the morning, at noon, in the evening, and at bedtime.  . Cholecalciferol (VITAMIN D) 125 MCG (5000 UT) CAPS Take 5,000 Units by mouth daily.  Marland Kitchen levothyroxine (SYNTHROID) 100 MCG tablet Take 1 tablet (100 mcg total) by mouth daily.  . NON FORMULARY Diet -Regular  . [DISCONTINUED] cyanocobalamin 2000 MCG tablet Take 2,000 mcg by mouth daily.  Marland Kitchen ALPRAZolam (XANAX) 0.5 MG tablet Take 1 tablet (0.5 mg total) by mouth at bedtime. (Patient not taking: Reported on 06/21/2019)  . cyclobenzaprine (FLEXERIL) 5 MG tablet Take 1 tablet (5 mg total) by mouth 3 (three) times daily as needed for muscle spasms. (Patient not taking: Reported on 06/21/2019)  . Lactulose 20 GM/30ML SOLN Take 30 mLs (20 g total) by mouth every 3 (three) hours. Until you produce a bowel movement (Patient not taking: Reported on 06/21/2019)  . Lidocaine HCl-Benzyl Alcohol (SALONPAS LIDOCAINE PLUS) 4-10 % CREA Apply 1 patch topically daily.  . [DISCONTINUED] feeding supplement, ENSURE ENLIVE, (ENSURE ENLIVE) LIQD Take 237 mLs by mouth daily.  . [DISCONTINUED] HYDROcodone-acetaminophen (NORCO/VICODIN) 5-325 MG tablet Take  1 tablet by mouth every 6 (six) hours as needed for severe pain. (Patient not taking: Reported on 06/21/2019)  . [DISCONTINUED] OXYGEN Inhale into the lungs. Oxygen @@ 2L/min via [place] to maintain sats >88%Special Instructions: Document O2 sat qshift. Every Shift Day, Evening, Night   No facility-administered encounter medications on file as of 06/21/2019.    ALLERGIES:  No Known Allergies   PHYSICAL EXAM:  ECOG Performance status: 2  Vitals:   06/21/19 0956  BP: 107/63  Pulse: 87  Resp: 18  Temp: (!) 96.9 F (36.1 C)  SpO2: 96%   Filed Weights   06/21/19 0956  Weight: 134 lb 4.8 oz (60.9 kg)    Physical Exam Vitals reviewed.  Constitutional:      Appearance: Normal appearance.  Cardiovascular:     Rate and Rhythm: Normal rate  and regular rhythm.     Heart sounds: Normal heart sounds.  Pulmonary:     Effort: Pulmonary effort is normal.     Breath sounds: Normal breath sounds.  Abdominal:     General: There is no distension.     Palpations: Abdomen is soft.  Skin:    General: Skin is warm.  Neurological:     General: No focal deficit present.     Mental Status: She is alert and oriented to person, place, and time.  Psychiatric:        Mood and Affect: Mood normal.        Behavior: Behavior normal.      LABORATORY DATA:  I have reviewed the labs as listed.  CBC    Component Value Date/Time   WBC 6.0 06/07/2019 0958   RBC 3.12 (L) 06/07/2019 0958   HGB 9.1 (L) 06/07/2019 0958   HCT 29.1 (L) 06/07/2019 0958   PLT 193 06/07/2019 0958   MCV 93.3 06/07/2019 0958   MCH 29.2 06/07/2019 0958   MCHC 31.3 06/07/2019 0958   RDW 16.2 (H) 06/07/2019 0958   LYMPHSABS 1.2 06/07/2019 0958   MONOABS 0.4 06/07/2019 0958   EOSABS 0.3 06/07/2019 0958   BASOSABS 0.0 06/07/2019 0958   CMP Latest Ref Rng & Units 05/28/2019 05/24/2019 05/18/2019  Glucose 70 - 99 mg/dL - 102(H) 103(H)  BUN 8 - 23 mg/dL - 15 14  Creatinine 0.44 - 1.00 mg/dL - 0.52 0.58  Sodium 135 - 145  mmol/L - 137 137  Potassium 3.5 - 5.1 mmol/L 3.5 2.9(L) 3.4(L)  Chloride 98 - 111 mmol/L - 95(L) 89(L)  CO2 22 - 32 mmol/L - 33(H) 34(H)  Calcium 8.9 - 10.3 mg/dL - 8.9 8.7(L)  Total Protein 6.5 - 8.1 g/dL - - -  Total Bilirubin 0.3 - 1.2 mg/dL - - -  Alkaline Phos 38 - 126 U/L - - -  AST 15 - 41 U/L - - -  ALT 0 - 44 U/L - - -       DIAGNOSTIC IMAGING:  I have reviewed her scans.   ASSESSMENT & PLAN:   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H[facility]) 1.  IgA kappa plasma cell myeloma: -Right sacral bone lesion biopsy on 05/17/2019 showed kappa restricted plasma cell neoplasm. -SPEP did not show any identifiable M spike.  Immunofixation shows IgA kappa monoclonal protein.  Kappa light chains 42.8, lambda light chains 9.9, ratio 4.32. -PET scan on 06/04/2019 showed expansile lytic lesion involving the right sacral wing.  Lytic lesion involving T10 vertebral body.  Left seventh rib lesion.  Increased uptake in a pathological fracture involving distal aspect of the right femur. -FISH panel showed loss of 1P, gain of 1 q., hyperdiploidy/monosomy of 13 q. and 14 representing high risk. -Bone marrow biopsy FISH panel also showed same findings.  Chromosome analysis was 28, XX. -24-hour urine was positive for Bence-Jones proteins.  LDH was normal.  Beta-2 microglobulin was pending from today for staging. -I have recommended RVD regimen.  We will start her on Revlimid 15 mg 2 weeks on 1 week off.  She will be started on dexamethasone 20 mg on day 1, 8, 15 every 21 days.  She will also receive Velcade weekly. -I proposed injection therapy with 4-6 cycles followed by maintenance therapy.  Because of her performance status and age she is not a candidate for transplant. -We discussed the side effects in detail.  We will obtain Revlimid.  We will likely start her  treatment next week.  2.  Normocytic anemia: -CBC on 06/07/2019 shows hemoglobin 9.1.  MCV normal.  Normal B12 and elevated methylmalonic acid. -Ferritin was 64  and percent saturation 9.  Folic acid was normal.  Stool for occult blood was negative in the ER.  Will consider parenteral iron therapy.  3.  Right femur fracture: -She is continuing home physical therapy and walking with help of walker.  4.  ID prophylaxis: -I have recommended acyclovir 400 mg twice daily and aspirin 81 mg for thromboprophylaxis.  5.  Left posterior rib pain: -She is taking hydrocodone 3 to 4 tablets/day.  Will give a refill.  She is also using lidocaine patches and takes Flexeril as needed.      Orders placed this encounter:  No orders of the defined types were placed in this encounter.    Derek Jack, MD [place] [phone]

## 2019-06-21 NOTE — Assessment & Plan Note (Addendum)
1.  IgA kappa plasma cell myeloma: -Right sacral bone lesion biopsy on 05/17/2019 showed kappa restricted plasma cell neoplasm. -SPEP did not show any identifiable M spike.  Immunofixation shows IgA kappa monoclonal protein.  Kappa light chains 42.8, lambda light chains 9.9, ratio 4.32. -PET scan on 06/04/2019 showed expansile lytic lesion involving the right sacral wing.  Lytic lesion involving T10 vertebral body.  Left seventh rib lesion.  Increased uptake in a pathological fracture involving distal aspect of the right femur. -FISH panel showed loss of 1P, gain of 1 q., hyperdiploidy/monosomy of 13 q. and 14 representing high risk. -Bone marrow biopsy FISH panel also showed same findings.  Chromosome analysis was 57, XX. -24-hour urine was positive for Bence-Jones proteins.  LDH was normal.  Beta-2 microglobulin was pending from today for staging. -I have recommended RVD regimen.  We will start her on Revlimid 15 mg 2 weeks on 1 week off.  She will be started on dexamethasone 20 mg on day 1, 8, 15 every 21 days.  She will also receive Velcade weekly. -I proposed injection therapy with 4-6 cycles followed by maintenance therapy.  Because of her performance status and age she is not a candidate for transplant. -We discussed the side effects in detail.  We will obtain Revlimid.  We will likely start her treatment next week.  2.  Normocytic anemia: -CBC on 06/07/2019 shows hemoglobin 9.1.  MCV normal.  Normal B12 and elevated methylmalonic acid. -Ferritin was 64 and percent saturation 9.  Folic acid was normal.  Stool for occult blood was negative in the ER.  Will consider parenteral iron therapy.  3.  Right femur fracture: -She is continuing home physical therapy and walking with help of walker.  4.  ID prophylaxis: -I have recommended acyclovir 400 mg twice daily and aspirin 81 mg for thromboprophylaxis.  5.  Left posterior rib pain: -She is taking hydrocodone 3 to 4 tablets/day.  Will give a  refill.  She is also using lidocaine patches and takes Flexeril as needed.

## 2019-06-21 NOTE — Patient Instructions (Signed)
[place] at [facility] Discharge Instructions  You were seen today by Dr. Delton Coombes. He went over your recent biopsy results. Your biopsy showed that you have a bone marrow cancer. You need to start treatment, your treatment will consist of a shot and pills. Revlimid, Velcade and Dexamethasone. He discussed how you will take treatment and what to expect. He will see you back in 1 week for labs, treatment and follow up.   Thank you for choosing [place] at [facility] to provide your oncology and hematology care.  To afford each patient quality time with our provider, please arrive at least 15 minutes before your scheduled appointment time.   If you have a lab appointment with the [facility] please come in thru the  Main Entrance and check in at the main information desk  You need to re-schedule your appointment should you arrive 10 or more minutes late.  We strive to give you quality time with our providers, and arriving late affects you and other patients whose appointments are after yours.  Also, if you no show three or more times for appointments you may be dismissed from the clinic at the providers discretion.     Again, thank you for choosing [facility].  Our hope is that these requests will decrease the amount of time that you wait before being seen by our physicians.       _____________________________________________________________  Should you have questions after your visit to [facility], please contact our office at [phone] between the hours of 8:00 a.m. and 4:30 p.m.  Voicemails left after 4:00 p.m. will not be returned until the following business day.  For prescription refill requests, have your pharmacy contact our office and allow 72 hours.    Cancer Center Support Programs:   > Cancer Support Group  2nd Tuesday of the month 1pm-2pm, Journey Room

## 2019-06-21 NOTE — Progress Notes (Signed)
I met with patient today. I reviewed her treatment plan. I gave her basic what to expect with chemotherapy education. I explained how her visit will go.  I provided a copy of her appointments and reviewed them with her.  I reviewed her medications that she will take preventive.  Her and her daughter were given the time to answer questions and all were answered to their satisfaction.

## 2019-06-22 LAB — BETA 2 MICROGLOBULIN, SERUM: Beta-2 Microglobulin: 3.7 mg/L — ABNORMAL HIGH (ref 0.6–2.4)

## 2019-06-25 ENCOUNTER — Other Ambulatory Visit (HOSPITAL_COMMUNITY): Payer: Self-pay | Admitting: *Deleted

## 2019-06-25 MED ORDER — SALONPAS LIDOCAINE PLUS 4-10 % EX CREA: 1.0000 | TOPICAL_CREAM | Freq: Every day | CUTANEOUS | 2 refills | Status: DC

## 2019-06-25 NOTE — Progress Notes (Signed)
.   Pharmacist Chemotherapy Monitoring - Initial Assessment    Anticipated start date: 06/27/19   Regimen:  . Are orders appropriate based on the patient's diagnosis, regimen, and cycle? Yes . Does the plan date match the patient's scheduled date? Yes . Is the sequencing of drugs appropriate? Yes . Are the premedications appropriate for the patient's regimen? Yes . Prior Authorization for treatment is: Approved o If applicable, is the correct biosimilar selected based on the patient's insurance? not applicable  Organ Function and Labs: Marland Kitchen Are dose adjustments needed based on the patient's renal function, hepatic function, or hematologic function? No . Are appropriate labs ordered prior to the start of patient's treatment? Yes . Other organ system assessment, if indicated: N/A . The following baseline labs, if indicated, have been ordered: N/A  Dose Assessment: . Are the drug doses appropriate? Yes . Are the following correct: o Drug concentrations Yes o IV fluid compatible with drug Yes o Administration routes Yes o Timing of therapy Yes . If applicable, does the patient have documented access for treatment and/or plans for port-a-cath placement? not applicable . If applicable, have lifetime cumulative doses been properly documented and assessed? not applicable Lifetime Dose Tracking  No doses have been documented on this patient for the following tracked chemicals: Doxorubicin, Epirubicin, Idarubicin, Daunorubicin, Mitoxantrone, Bleomycin, Oxaliplatin, Carboplatin, Liposomal Doxorubicin  o   Toxicity Monitoring/Prevention: . The patient has the following take home antiemetics prescribed: Prochlorperazine . The patient has the following take home medications prescribed: antimicrobial prophylaxis . Medication allergies and previous infusion related reactions, if applicable, have been reviewed and addressed. Yes . The patient's current medication list has been assessed for drug-drug  interactions with their chemotherapy regimen. no significant drug-drug interactions were identified on review.  Order Review: . Are the treatment plan orders signed? No . Is the patient scheduled to see a provider prior to their treatment?yes  I verify that I have reviewed each item in the above checklist and answered each question accordingly.  [name] 06/25/2019 2:16 PM

## 2019-06-26 NOTE — Patient Instructions (Addendum)
Courtney Arnold are diagnosed with multiple myeloma.  You will be treated weekly in the clinic with an injection of bortezomib (Velcade).  You will take a steroid, dexamethasone, weekly at home.  You will also have a chemotherapy pill that you will take at home - lenalidomide (Revlimid).  You will take this drug for two weeks, have a week off from taking the chemo pill, then start taking again the following week for two weeks.  You will repeat that process each cycle.  The intent of treatment is to control your cancer, prevent it from spreading further, and to help alleviate any symptoms you may be having related to your disease.  You will see the doctor regularly throughout treatment.  We will obtain blood work from you prior to every treatment and monitor your results to make sure it is safe to give your treatment. The doctor monitors your response to treatment by the way you are feeling, your blood work, and by obtaining scans periodically.  There will be wait times while you are here for treatment.  It will take about 30 minutes to 1 hour for your lab work to result.  Then there will be wait times while pharmacy mixes your medications.   Bortezomib (Velcade)  About This Drug  Bortezomib is used to treat cancer. It is given in the vein (IV) or by a shot under the skin (subcutaneously).  You will receive this injection under your skin.  Possible Side Effects  . Bone marrow suppression. Decrease in the number of white blood cells, red blood cells, and platelets. This may raise your risk of infection, make you tired and weak (fatigue), and raise your risk of bleeding.  . Nausea and vomiting (throwing up)  . Constipation (not able to move bowels)  . Diarrhea (loose bowel movements)  . Fever  . Tiredness  . Decreased appetite (decreased hunger)  . Effects on the nerves are called peripheral neuropathy. You may feel numbness, tingling, or pain in your  hands and feet. It may be hard for you to button your clothes, open jars, or walk as usual. The effect on the nerves may get worse with more doses of the drug. These effects get better in some people after the drug is stopped but it does not get better in all people.  . Rash  Note: Each of the side effects above was reported in 20% or greater of patients treated with bortezomib. Not all possible side effects are included above.  Warnings and Precautions  . Severe peripheral neuropathy  . Low blood pressure  . Congestive heart failure - your heart has less ability to pump blood properly.  . Trouble breathing because of fluid build-up and/or inflammation in your lungs  . Nausea, vomiting, diarrhea and constipation which sometimes requires treatment to help lessen these side effects. There is also an increased risk of developing a partial or complete blockage of your small and/or large intestine.  . Changes in your central nervous system can happen. The central nervous system is made up of your brain and spinal cord. You could feel extreme tiredness, agitation, confusion, have hallucinations (see or hear things that are not there), trouble understanding or speaking, loss of control of your bowels or bladder, eyesight changes, numbness or lack of strength to your arms, legs, face, or body, seizures or coma. If you start to have any of these symptoms let your doctor know right away.   . Tumor  lysis syndrome: This drug may act on the cancer cells very quickly. This may affect how your kidneys work.  . Changes in your liver function  Increased risk of a syndrome that affects your red blood cells, platelets and blood vessels in your kidneys, which can cause kidney failure and be life-threatening.  Important Information  . This drug may be present in the saliva, tears, sweat, urine, stool, vomit, semen, and vaginal secretions. Talk to your doctor and/or your nurse about the necessary precautions  to take during this time.  . This drug may impair your ability to drive or use machinery. Use caution and tell your nurse or doctor if you feel dizzy, very sleepy, and/or experience low blood pressure.  Treating Side Effects  . Manage tiredness by pacing your activities for the day.  . Be sure to include periods of rest between energy-draining activities.  . To decrease the risk of infection, wash your hands regularly.  . Avoid close contact with people who have a cold, the flu, or other infections.  . Take your temperature as your doctor or nurse tells you, and whenever you feel like you may have a fever.  . To help decrease the risk of bleeding, use a soft toothbrush. Check with your nurse before using dental floss.  . Be very careful when using knives or tools.  . Use an electric shaver instead of a razor.  . Ask your doctor or nurse about medicines that are available to help stop or lessen constipation.  . If you are not able to move your bowels, check with your doctor or nurse before you use enemas, laxatives, or suppositories.  . Drink plenty of fluids (a minimum of eight glasses per day is recommended).  . If you throw up or have loose bowel movements, you should drink more fluids so that you do not become dehydrated (lack of water in the body from losing too much fluid).  . If you have diarrhea, eat low-fiber foods that are high in protein and calories and avoid foods that can irritate your digestive tracts or lead to cramping.  . Ask your nurse or doctor about medicine that can lessen or stop your diarrhea.  . To help with nausea and vomiting, eat small, frequent meals instead of three large meals a day. Choose foods and drinks that are at room temperature. Ask your nurse or doctor about other helpful tips and medicine that is available to help stop or lessen these symptoms.  . To help with decreased appetite, eat foods high in calories and protein, such as meat, poultry,  fish, dry beans, tofu, eggs, nuts, milk, yogurt, cheese, ice cream, pudding, and nutritional supplements.  . Consider using sauces and spices to increase taste. Daily exercise, with your doctor's approval, may increase your appetite.  . If you have numbness and tingling in your hands and feet, be careful when cooking, walking, and handling sharp objects and hot liquids.  . If you get a rash do not put anything on it unless your doctor or nurse says you may. Keep the area around the rash clean and dry. Ask your doctor for medicine if your rash bothers you.  Food and Drug Interactions  . This drug may interact with grapefruit and grapefruit juice. Talk to your doctor as this could make side effects worse.  . Check with your doctor or pharmacist about all other prescription medicines and over-the-counter medicines and dietary supplements (vitamins, minerals, herbs and others) you are taking  before starting this medicine as there are known drug interactions with bortezomib. Also, check with your doctor or pharmacist before starting any new prescription or over-the-counter medicines, or dietary supplements to make sure that there are no interactions.  . Avoid the use of St. John's Wort with bortezomib as this may lower the levels of the drug in your body, which can make it less effective.  When to Call the Doctor  Call your doctor or nurse if you have any of these symptoms and/or any new or unusual symptoms:  . Fever of 100.4 F (38 C) or higher  . Chills  . Tiredness that interferes with your daily activities  . Feeling dizzy or lightheaded  . Feeling that your heart is beating in a fast or not normal way (palpitations)  . Cough  . Wheezing or trouble breathing  . Easy bleeding or bruising  . Confusion and/or agitation  . Hallucinations  . Trouble understanding or speaking  . Blurry vision or changes in your eyesight  . Numbness or lack of strength to your arms, legs, face,  or body  . Symptoms of a seizure such as confusion, blacking out, passing out, loss of hearing or vision, blurred vision, unusual smells or tastes (such as burning rubber), trouble talking, tremors or shaking in parts or all of the body, repeated body movements, tense muscles that do not relax, and loss of control of urine and bowels. If you or your family member suspects you are having a seizure, call 911 right away.  . Nausea that stops you from eating or drinking and/or is not relieved by prescribed medicines  . Throwing up  . Lasting loss of appetite or rapid weight loss of five pounds in a week  . No bowel movement in 3 days or when you feel uncomfortable.  . Abdominal pain that does not go away  . Diarrhea, 4 times in one day or diarrhea with lack of strength or a feeling of being dizzy  . Numbness, tingling, or pain your hands and feet  . Swelling of legs, ankles, and/or feet  . Weight gain of 5 pounds in one week (fluid retention)  . Decreased urine, or very dark urine  . New rash and/or itching  . Rash that is not relieved by prescribed medicines  . Signs of tumor lysis: Confusion or agitation, decreased urine, nausea/vomiting, diarrhea, muscle cramping, numbness and/or tingling, seizures.  . Signs of possible liver problems: dark urine, pale bowel movements, bad stomach pain, feeling very tired and weak, unusual itching, or yellowing of the eyes or skin  . If you think you are pregnant or may have impregnated your partner  Reproduction Warnings  . Pregnancy warning: This drug can have harmful effects on the unborn baby. Women of child bearing potential should use effective methods of birth control during your cancer treatment and for at least 7 months after treatment. Men with female partners of childbearing potential should use effective methods of birth control during your cancer treatment and for at least 4 months after your cancer treatment. Let your doctor know  right away if you think you may be pregnant or may have impregnated your partner.  . Breastfeeding warning: Women should not breastfeed during treatment and for 2 months month after treatment because this drug could enter the breast milk and cause harm to a breastfeeding baby.  . Fertility warning: In men and women both, this drug may affect your ability to have children in the future. Talk  with your doctor or nurse if you plan to have children. Ask for information on sperm or egg banking.   Lenalidomide (Revlimid)  About This Drug Lenalidomide is used to treat cancer. It is given orally (by mouth).  Possible Side Effects . Bone marrow suppression. This is a decrease in the number of white blood cells, red blood cells, and platelets. This may raise your risk of infection, make you tired and weak (fatigue), and raise your risk of bleeding.  . Nausea  . Diarrhea (loose bowel movements)  . Constipation (unable to move bowels)  . Inflammation of your stomach and/or intestines  . Pain in your abdomen or back pain  . Fever  . Tiredness and weakness  . Swelling of your legs, ankles and/or feet  . Decreased appetite (decreased hunger)  . Muscle cramps/spasms  . Pain in your joints  . Headache  . Feeling dizzy  . Tremor  . Trouble sleeping  . Nosebleed  . Upper respiratory infection, bronchitis  . Inflammation of the nasal passages and throat  . Trouble breathing  . Cough  . Rash and itching  Note: Each of the side effects above was reported in 15% or greater of patients treated with lenalidomide. Not all possible side effects are included above.  Warnings and Precautions . Blood clots and events such as stroke and heart attack. A blood clot in your leg may cause your leg to swell, appear red and warm, and/or cause pain. A blood clot in your lungs may cause trouble breathing, pain when breathing, and/or chest pain.  . Severe bone marrow suppression  . Changes in  your liver function, which may cause liver failure and be life-threatening.  . Tumor lysis syndrome: This drug may act on the cancer cells very quickly. This may affect how your kidneys work and can be life-threatening.  . Changes in your thyroid function  . Severe allergic skin reaction which may be life-threatening. You may develop blisters on your skin that are filled with fluid or a severe red rash all over your body that may be painful.  . This drug may raise your risk of getting a second cancer.  . You may develop a syndrome called tumor flare reaction. You may have painful lymph nodes, enlarged spleen, fever, and a rash.  . This drug may make it more difficult to collect your stem cells if a stem cell transplant is part of your treatment plan.  . There is a rare increased risk of death in patients with chronic lymphocytic leukemia and a risk of early death (dying sooner) in patient with mantle cell lymphoma.  . Allergic reactions, including anaphylaxis are rare but may happen in some patients. Signs of allergic reaction to this drug may be swelling of the face, feeling like your tongue or throat are swelling, trouble breathing, rash, itching, fever, chills, feeling dizzy, and/or feeling that your heart is beating in a fast or not normal way. If this happens, do not take another dose of this drug. You should get urgent medical treatment.  Note: Some of the side effects above are very rare. If you have concerns and/or questions, please discuss them with your medical team.  Important Information . You will need to sign up for a special program called Revlimid REMS when you start taking this drug. Your nurse will help you get started.  . Two negative pregnancy tests are required in women of childbearing potential prior to starting treatment. Routine pregnancy tests are required  during treatment.  . Do not donate blood during your treatment and for 4 weeks after your treatment.  . Men  should not donate sperm during your treatment and for 4 weeks after your treatment because this drug is present in semen and may badly harm a baby.  How to Take Your Medication . Swallow the medicine whole with water, with or without food. Do not chew, break, or open it.  . Take this medicine at about the same time each day  . Missed dose: If you miss a dose, take it as soon as you think about it ONLY if it has been less than 12 hours since you normally take the missed dose. If it has been more than 12 hours, skip the missed dose and contact your physician. Take your next dose at the regular time. Do not take 2 doses at the same time and do not double up on the next dose.  . If you vomit a dose, take your next dose at the regular time.  . Handling: Wash your hands after handling your medicine, your caretakers should not handle your medicine with bare hands and should wear latex gloves.  . If you get any of the content of a broken capsules on your skin, you should wash the area of the skin well with soap and water right away. Call your doctor if you get a skin reaction.  . This drug may be present in the saliva, tears, sweat, urine, stool, vomit, semen, and vaginal secretions. Talk to your doctor and/or your nurse about the necessary precautions to take during this time.  . Storage: Store this medicine in the original container at room temperature.  . Disposal of unused medicine: Do not flush any expired and/or unused medicine down the toilet or drain unless you are specifically instructed to do so on the medication label. Some facilities have take-back programs and/or other options. If you do not have a take-back program in your area, then please discuss with your nurse or your doctor how to dispose of unused medicine.  Treating Side Effects . Manage tiredness by pacing your activities for the day.  . Be sure to include periods of rest between energy-draining activities.  . If you are  dizzy, get up slowly after sitting or lying.  . To decrease the risk of infection, wash your hands regularly.  . Avoid close contact with people who have a cold, the flu, or other infections.  . Take your temperature as your doctor or nurse tells you, and whenever you feel like you may have a fever.  . To help decrease the risk of bleeding, use a soft toothbrush. Check with your nurse before using dental floss.  . Be very careful when using knives or tools.  . Use an electric shaver instead of a razor.  . Ask your doctor or nurse about medicines that are available to help stop or lessen constipation and/or diarrhea.  . If you are not able to move your bowels, check with your doctor or nurse before you use enemas, laxatives, or suppositories.  . Drink plenty of fluids (a minimum of eight glasses per day is recommended).  . Drink fluids that contribute calories (whole milk, juice, soft drinks, sweetened beverages, milkshakes, and nutritional supplements) instead of water.  . If you throw up or have loose bowel movements, you should drink more fluids so that you do not become dehydrated (lack of water in the body from losing too much fluid).  Marland Kitchen  If you have diarrhea, eat low-fiber foods that are high in protein and calories and avoid foods that can irritate your digestive tracts or lead to cramping.  . To help with nausea and vomiting, eat small, frequent meals instead of three large meals a day. Choose foods and drinks that are at room temperature. Ask your nurse or doctor about other helpful tips and medicine that is available to help stop or lessen these symptoms.  . To help with decreased appetite, eat small, frequent meals. Eat foods high in calories and protein, such as meat, poultry, fish, dry beans, tofu, eggs, nuts, milk, yogurt, cheese, ice cream, pudding, and nutritional supplements.  . Consider using sauces and spices to increase taste. Daily exercise, with your doctor's  approval, may increase your appetite.  . If you get a rash, do not put anything on it unless your doctor or nurse says you may. Keep the area around the rash clean and dry. Ask your doctor for medicine if your rash bothers you.  Marland Kitchen Keeping your pain under control is important to your well-being. Please tell your doctor or nurse if you are experiencing pain.  . If you are having trouble sleeping, talk to your nurse or doctor on tips to help you sleep better.  . If you have a nosebleed, sit with your head tipped slightly forward. Apply pressure by lightly pinching the bridge of your nose between your thumb and forefinger. Call your doctor if you feel dizzy or faint or if the bleeding does not stop after 10 to 15 minutes.  . Moisturize your skin several times a day.  . Avoid sun exposure and apply sunscreen routinely when outdoors.  Food and Drug Interactions  . There are no known interactions of lenalidomide with food.  . Check with your doctor or pharmacist about all other prescription medicines and over-the-counter medicines and dietary supplements (vitamins, minerals, herbs, and others) you are taking before starting this medicine as there are known drug interactions with lenalidomide. Also, check with your doctor or pharmacist before starting any new prescription or over-the-counter medicines, or dietary supplements to make sure that there are no interactions.  . There are known interactions of lenalidomide with blood-thinning medicine such as warfarin. Ask your doctor what precautions you should take.  When to Call the Doctor Call your doctor or nurse if you have any of these symptoms and/or any new or unusual symptoms:  . Fever of 100.4 F (38 C) or higher  . Chills  . Tiredness that interferes with your daily activities  . Feeling dizzy or lightheaded  . Easy bleeding or bruising  . Your leg or arm is swollen, red, warm, and/or painful  . Headache that does not go away  .  Nosebleed that does not stop bleeding after 10-15 minutes  . Painful lymph nodes  . Wheezing and/or trouble breathing  . Chest pain or symptoms of a heart attack. Most heart attacks involve pain in the center of the chest that lasts more than a few minutes. The pain may go away and come back. It can feel like pressure, squeezing, fullness, or pain. Sometimes pain is felt in one or both arms, the back, neck, jaw, or stomach. If any of these symptoms last 2 minutes, call 911.  Marland Kitchen Symptoms of a stroke such as sudden numbness or weakness of your face, arm, or leg, mostly on one side of your body; sudden confusion, trouble speaking or understanding; sudden trouble seeing in one or both  eyes; sudden trouble walking, feeling dizzy, loss of balance or coordination; or sudden, bad headache with no known cause. If you have any of these symptoms for 2 minutes, call 911.  . Signs of allergic reaction: swelling of the face, feeling like your tongue or throat are swelling, trouble breathing, rash, itching, fever, chills, feeling dizzy, and/or feeling that your heart is beating in a fast or not normal way. If this happens, call 911 for emergency care.  . Coughing up yellow, green, or bloody mucus  . Feeling that your heart is beating in a fast or not normal way (palpitations)  . Nausea that stops you from eating or drinking and/or is not relieved by prescribed medicines  . Throwing up  . Loose bowel movements (diarrhea) 4 times a day or loose bowel movements with lack of strength or a feeling of being dizzy  . No bowel movement in 3 days or when you feel uncomfortable  . Trouble falling or staying asleep  . Pain in your abdomen that does not go away  . Weight gain of 5 pounds in one week (fluid retention)  . Swelling of your legs, ankles and/or feet  . Unexplained weight gain  . Lasting loss of appetite or rapid weight loss of five pounds in a week  . Pain that does not go away, or is not  relieved by prescribed medicines  . Flu-like symptoms: fever, headache, muscle and joint aches, and fatigue (low energy, feeling weak)  . A new rash or itching that is not relieved by prescribed medicines  . Signs of possible liver problems: dark urine, pale bowel movements, bad stomach pain, feeling very tired and weak, unusual itching, or yellowing of the eyes or skin  . Signs of tumor lysis: confusion or agitation, decreased urine, nausea/vomiting, diarrhea, muscle cramping, numbness and/or tingling, seizures  . If you think you may be pregnant or may have impregnated your partner  Reproduction Warnings  . Pregnancy warning: This drug can have harmful effects on the unborn baby. Women of childbearing potential must commit to abstain from heterosexual intercourse or use 2 effective methods of birth control, one of which, must be a highly effective method of birth control, beginning at least 4 weeks before treatment starts, during your cancer treatment, including dose interruptions, and for at least 4 weeks after treatment. A highly effective method of birth control includes tubal ligation, intrauterine device (IUD), hormonal (birth control pills, injections, patch and/or implants) or a partner's vasectomy. Stop taking lenalidomide immediately and let your doctor know right away if you think you may be pregnant, miss your menstrual period, or experience unusual menstrual bleeding.  . Men with female partners of childbearing potential should use effective methods of birth control during your cancer treatment and for at least 4 weeks after your cancer treatment. You should always wear a condom even if you have undergone a successful vasectomy. Let your doctor know right away if you think you may have impregnated your partner.  . Breastfeeding warning: Women should not breastfeed during treatment because this drug could enter the breast milk and cause harm to a breastfeeding baby.  . Fertility  warning: Human fertility studies have not been done with this drug. Talk with your doctor or nurse if you plan to have children. Ask for information on sperm or egg banking.    Dexamethasone (Decadron)  About This Drug  Dexamethasone is used to treat cancer, to decrease inflammation and sometimes used before and after chemotherapy to  prevent or treat nausea and/or vomiting. It is given in the vein (IV) or orally (by mouth).  Possible Side Effects  . Headache  . High blood pressure  . Abnormal heart beat  . Tiredness and weakness  . Changes in mood, which may include depression or a feeling of extreme well-being  . Trouble sleeping  . Increased sweating  . Increased appetite (increased hunger)  . Weight gain  . Increase risk of infections  . Pain in your abdomen  . Nausea  . Skin changes such as rash, dryness, redness  . Blood sugar levels may change  . Electrolyte changes  . Swelling of your legs, ankles and/or feet  . Changes in your liver function  . You may be at risk for cataracts, glaucoma or infections of the eye  . Muscle loss and / or weakness (lack of muscle strength)  . Increased risk of developing osteoporosis- your bones may become weak and brittle  Note: Not all possible side effects are included above.  Warnings and Precautions  . This drug may cause you to feel irritable, nervous or restless.  . Allergic reactions, including anaphylaxis are rare but may happen in some patients. Signs of allergic reaction to this drug may be swelling of the face, feeling like your tongue or throat are swelling, trouble breathing, rash, itching, fever, chills, feeling dizzy, and/or feeling that your heart is beating in a fast or not normal way. If this happens, do not take another dose of this drug. You should get urgent medical treatment.  . High blood pressure and changes in electrolytes, which can cause fluid build-up around your heart, lungs or  elsewhere.  . Increased risk of developing a hole in your stomach, small, and/or large intestine if you have ulcers in the lining of your stomach and/or intestine, or have diverticulitis, ulcerative colitis and/or other diseases that affect the gastrointestinal tract.  . Effects on the endocrine glands including the pituitary, adrenals or thyroid during or after use of this medication.  . Changes in the tissue of the heart, that can cause your heart to have less ability to pump blood. You may be short of breath or our arms, hands, legs and feet may swell.  . Increased risk of heart attack.  . Severe depression and other psychiatric disorders such as mood changes.  . Burning, pain and itching around your anus may happen when this drug is given in the vein too rapidly (IV). It usually happens suddenly and resolves in less than 1 minute.  Important Information  . Talk to your doctor or your nurse before stopping this medication, it should be stopped gradually. Depending on the dose and length of treatment, you could experience serious side effects if stopped abruptly (suddenly).  . Talk to your doctor before receiving any vaccinations during your treatment. Some vaccinations are not recommended while receiving dexamethasone.  How to Take Your Medication  . For Oral (by mouth): You can take the medicine with or without food. If you have nausea or upset stomach, take it with food.  . Missed dose: If you miss a dose, do not take 2 doses at the same time or extra doses. . If you vomit a dose, take your next dose at the regular time. Do not take 2 doses at the same time  . Handling: Wash your hands after handling your medicine, your caretakers should not handle your medicine with bare hands and should wear latex gloves.  . Storage: Store this  medicine in the original container at room temperature. Protect from moisture and light. Discuss with your nurse or your doctor how to dispose of unused  medicine.  Treating Side Effects  . Drink plenty of fluids (a minimum of eight glasses per day is recommended).  . To help with nausea and vomiting, eat small, frequent meals instead of three large meals a day. Choose foods and drinks that are at room temperature. Ask your nurse or doctor about other helpful tips and medicine that is available to help stop or lessen these symptoms.  . If you throw up, you should drink more fluids so that you do not become dehydrated (lack of water in the body from losing too much fluid).  . Manage tiredness by pacing your activities for the day.  . Be sure to include periods of rest between energy-draining activities.  . To help with muscle weakness, get regular exercise. If you feel too tired to exercise vigorously, try taking a short walk.  . If you are having trouble sleeping, talk to your nurse or doctor on tips to help you sleep better.  . If you are feeling depressed, talk to your nurse or doctor about it.  Marland Kitchen Keeping your pain under control is important to your well-being. Please tell your doctor or nurse if you are experiencing pain.  . If you have diabetes, keep good control of your blood sugar level. Tell your nurse or your doctor if your glucose levels are higher or lower than normal.  . To decrease the risk of infection, wash your hands regularly.  . Avoid close contact with people who have a cold, the flu, or other infections.  . Take your temperature as your doctor or nurse tells you, and whenever you feel like you may have a fever.  . If you get a rash do not put anything on it unless your doctor or nurse says you may. Keep the area around the rash clean and dry. Ask your doctor for medicine if your rash bothers you.  . Moisturize your skin several times day.  . Avoid sun exposure and apply sunscreen routinely when outdoors.  Food and Drug Interactions  . There are no known interactions of dexamethasone with food.  . Check with  your doctor or pharmacist about all other prescription medicines and over-the-counter medicines and dietary supplements (vitamins, minerals, herbs and others) you are taking before starting this medicine as there are known drug interactions with dexamethasone. Also, check with your doctor or pharmacist before starting any new prescription or over-the-counter medicines, or dietary supplement to make sure that there are no interactions.  . There are known interactions of dexamethasone with other medicines and products like acetaminophen, aspirin, and ibuprofen. Ask your doctor what over-the-counter (OTC) medicines you can take.  When to Call the Doctor  Call your doctor or nurse if you have any of these symptoms and/or any new or unusual symptoms:  . Fever of 100.4 F (38 C) or higher  . Chills  . A headache that does not go away  . Trouble breathing  . Blurry vision or other changes in eyesight  . Feel irritable, nervous or restless  . Trouble falling or staying asleep  . Severe mood changes such as depression or unusual thoughts and/or behaviors  . Thoughts of hurting yourself or others, and suicide  . Tiredness that interferes with your daily activities  . Feeling that your heart is beating in a fast, slow or not normal way  .  Feeling dizzy or lightheaded  . Chest pain or symptoms of a heart attack. Most heart attacks involve pain in the center of the chest that lasts more than a few minutes. The pain may go away and come back, or it can be constant. It can feel like pressure, squeezing, fullness, or pain. Sometimes pain is felt in one or both arms, the back, neck, jaw, or stomach. If any of these symptoms last 2 minutes, call 911.   Marland Kitchen Heartburn or indigestion  . Nausea that stops you from eating or drinking and/or is not relieved by prescribed medicines  . Throwing up  . Pain in your abdomen that does not go away  . Abnormal blood sugar  . Unusual thirst, passing urine  often, headache, sweating, shakiness, irritability  . Swelling of legs, ankles, or feet  . Weight gain of 5 pounds in one week (fluid retention)  . Signs of possible liver problems: dark urine, pale bowel movements, bad stomach pain, feeling very tired and weak, unusual itching, or yellowing of the eyes or skin  . Severe muscle weakness  . A new rash or a rash that is not relieved by prescribed medicines  . Signs of allergic reaction: swelling of the face, feeling like your tongue or throat are swelling, trouble breathing, rash, itching, fever, chills, feeling dizzy, and/or feeling that your heart is beating in a fast or not normal way. If this happens, call 911 for emergency care.  . If you think you may be pregnant  Reproduction Warnings  . Pregnancy warning: It is not known if this drug may harm an unborn child. For this reason, be sure to talk with your doctor if you are pregnant or planning to become pregnant while receiving this drug. Let your doctor know right away if you think you may be pregnant or may have impregnated your partner.  . Breastfeeding warning: It is not known if this drug passes into breast milk. For this reason, women should talk to their doctor about the risks and benefits of breastfeeding during treatment with this drug because this drug may enter the breast milk and cause harm to a breastfeeding baby.  . Fertility warning: Human fertility studies have not been done with this drug. Talk with your doctor or nurse if you plan to have children. Ask for information on sperm banking.   SELF CARE ACTIVITIES WHILE RECEIVING CHEMOTHERAPY:  Hydration Increase your fluid intake 48 hours prior to treatment and drink at least 8 to 12 cups (64 ounces) of water/decaffeinated beverages per day after treatment. You can still have your cup of coffee or soda but these beverages do not count as part of your 8 to 12 cups that you need to drink daily. No alcohol  intake.  Medications Continue taking your normal prescription medication as prescribed.  If you start any new herbal or new supplements please let us know first to make sure it is safe.  Mouth Care Have teeth cleaned professionally before starting treatment. Keep dentures and partial plates clean. Use soft toothbrush and do not use mouthwashes that contain alcohol. Biotene is a good mouthwash that is available at most pharmacies or may be ordered by calling [phone]. Use warm salt water gargles (1 teaspoon salt per 1 quart warm water) before and after meals and at bedtime. If you need dental work, please let the doctor know before you go for your appointment so that we can coordinate the best possible time for you in regards  to your chemo regimen. You need to also let your dentist know that you are actively taking chemo. We may need to do labs prior to your dental appointment.  Skin Care Always use sunscreen that has not expired and with SPF (Sun Protection Factor) of 50 or higher. Wear hats to protect your head from the sun. Remember to use sunscreen on your hands, ears, face, & feet.  Use good moisturizing lotions such as udder cream, eucerin, or even Vaseline. Some chemotherapies can cause dry skin, color changes in your skin and nails.    . Avoid long, hot showers or baths. . Use gentle, fragrance-free soaps and laundry detergent. . Use moisturizers, preferably creams or ointments rather than lotions because the thicker consistency is better at preventing skin dehydration. Apply the cream or ointment within 15 minutes of showering. Reapply moisturizer at night, and moisturize your hands every time after you wash them.  Hair Loss (if your doctor says your hair will fall out)  . If your doctor says that your hair is likely to fall out, decide before you begin chemo whether you want to wear a wig. You may want to shop before treatment to match your hair color. . Hats, turbans, and scarves  can also camouflage hair loss, although some people prefer to leave their heads uncovered. If you go bare-headed outdoors, be sure to use sunscreen on your scalp. . Cut your hair short. It eases the inconvenience of shedding lots of hair, but it also can reduce the emotional impact of watching your hair fall out. . Don't perm or color your hair during chemotherapy. Those chemical treatments are already damaging to hair and can enhance hair loss. Once your chemo treatments are done and your hair has grown back, it's OK to resume dyeing or perming hair.  With chemotherapy, hair loss is almost always temporary. But when it grows back, it may be a different color or texture. In older adults who still had hair color before chemotherapy, the new growth may be completely gray.  Often, new hair is very fine and soft.  Infection Prevention Please wash your hands for at least 30 seconds using warm soapy water. Handwashing is the #1 way to prevent the spread of germs. Stay away from sick people or people who are getting over a cold. If you develop respiratory systems such as green/yellow mucus production or productive cough or persistent cough let us know and we will see if you need an antibiotic. It is a good idea to keep a pair of gloves on when going into grocery stores/[employer] to decrease your risk of coming into contact with germs on the carts, etc. Carry alcohol hand gel with you at all times and use it frequently if out in public. If your temperature reaches 100.5 or higher please call the clinic and let us know.  If it is after hours or on the weekend please go to the ER if your temperature is over 100.5.  Please have your own personal thermometer at home to use.    Sex and bodily fluids If you are going to have sex, a condom must be used to protect the person that isn't taking chemotherapy. Chemo can decrease your libido (sex drive). For a few days after chemotherapy, chemotherapy can be excreted through  your bodily fluids.  When using the toilet please close the lid and flush the toilet twice.  Do this for a few day after you have had chemotherapy.   Effects of  chemotherapy on your sex life Some changes are simple and won't last long. They won't affect your sex life permanently.  Sometimes you may feel: . too tired . not strong enough to be very active . sick or sore  . not in the mood . anxious or low Your anxiety might not seem related to sex. For example, you may be worried about the cancer and how your treatment is going. Or you may be worried about money, or about how you family are coping with your illness.  These things can cause stress, which can affect your interest in sex. It's important to talk to your partner about how you feel.  Remember - the changes to your sex life don't usually last long. There's usually no medical reason to stop having sex during chemo. The drugs won't have any long term physical effects on your performance or enjoyment of sex. Cancer can't be passed on to your partner during sex  Contraception It's important to use reliable contraception during treatment. Avoid getting pregnant while you or your partner are having chemotherapy. This is because the drugs may harm the baby. Sometimes chemotherapy drugs can leave a man or woman infertile.  This means you would not be able to have children in the future. You might want to talk to someone about permanent infertility. It can be very difficult to learn that you may no longer be able to have children. Some people find counselling helpful. There might be ways to preserve your fertility, although this is easier for men than for women. You may want to speak to a fertility expert. You can talk about sperm banking or harvesting your eggs. You can also ask about other fertility options, such as donor eggs. If you have or have had breast cancer, your doctor might advise you not to take the contraceptive pill. This is because the  hormones in it might affect the cancer. It is not known for sure whether or not chemotherapy drugs can be passed on through semen or secretions from the vagina. Because of this some doctors advise people to use a barrier method if you have sex during treatment. This applies to vaginal, anal or oral sex. Generally, doctors advise a barrier method only for the time you are actually having the treatment and for about a week after your treatment. Advice like this can be worrying, but this does not mean that you have to avoid being intimate with your partner. You can still have close contact with your partner and continue to enjoy sex.  Animals If you have cats or birds we just ask that you not change the litter or change the cage.  Please have someone else do this for you while you are on chemotherapy.   Food Safety During and After Cancer Treatment Food safety is important for people both during and after cancer treatment. Cancer and cancer treatments, such as chemotherapy, radiation therapy, and stem cell/bone marrow transplantation, often weaken the immune system. This makes it harder for your body to protect itself from foodborne illness, also called food poisoning. Foodborne illness is caused by eating food that contains harmful bacteria, parasites, or viruses.  Foods to avoid Some foods have a higher risk of becoming tainted with bacteria. These include: Marland Kitchen Unwashed fresh fruit and vegetables, especially leafy vegetables that can hide dirt and other contaminants . Raw sprouts, such as alfalfa sprouts . Raw or undercooked beef, especially ground beef, or other raw or undercooked meat and poultry . Fatty,  fried, or spicy foods immediately before or after treatment.  These can sit heavy on your stomach and make you feel nauseous. . Raw or undercooked shellfish, such as oysters. . Sushi and sashimi, which often contain raw fish.  . Unpasteurized beverages, such as unpasteurized fruit juices, raw milk,  raw yogurt, or cider . Undercooked eggs, such as soft boiled, over easy, and poached; raw, unpasteurized eggs; or foods made with raw egg, such as homemade raw cookie dough and homemade mayonnaise  Simple steps for food safety  Shop smart. . Do not buy food stored or displayed in an unclean area. . Do not buy bruised or damaged fruits or vegetables. . Do not buy cans that have cracks, dents, or bulges. . Pick up foods that can spoil at the end of your shopping trip and store them in a cooler on the way home.  Prepare and clean up foods carefully. . Rinse all fresh fruits and vegetables under running water, and dry them with a clean towel or paper towel. . Clean the top of cans before opening them. . After preparing food, wash your hands for 20 seconds with hot water and soap. Pay special attention to areas between fingers and under nails. . Clean your utensils and dishes with hot water and soap. Marland Kitchen Disinfect your kitchen and cutting boards using 1 teaspoon of liquid, unscented bleach mixed into 1 quart of water.    Dispose of old food. . Eat canned and packaged food before its expiration date (the "use by" or "best before" date). . Consume refrigerated leftovers within 3 to 4 days. After that time, throw out the food. Even if the food does not smell or look spoiled, it still may be unsafe. Some bacteria, such as Listeria, can grow even on foods stored in the refrigerator if they are kept for too long.  Take precautions when eating out. . At restaurants, avoid buffets and salad bars where food sits out for a long time and comes in contact with many people. Food can become contaminated when someone with a virus, often a norovirus, or another "bug" handles it. . Put any leftover food in a "to-go" container yourself, rather than having the server do it. And, refrigerate leftovers as soon as you get home. . Choose restaurants that are clean and that are willing to prepare your food as you order it  cooked.   AT HOME MEDICATIONS:                                                                                                                                                                Compazine/Prochlorperazine '10mg'$  tablet. Take 1 tablet every 6 hours as needed for nausea/vomiting. (This can make you sleepy)   EMLA cream. Apply a quarter size  amount to port site 1 hour prior to chemo. Do not rub in. Cover with plastic wrap.    Diarrhea Sheet   If you are having loose stools/diarrhea, please purchase Imodium and begin taking as outlined:  At the first sign of poorly formed or loose stools you should begin taking Imodium (loperamide) 2 mg capsules.  Take two tablets ('4mg'$ ) followed by one tablet ('2mg'$ ) every 2 hours - DO NOT EXCEED 8 tablets in 24 hours.  If it is bedtime and you are having loose stools, take 2 tablets at bedtime, then 2 tablets every 4 hours until morning.   Always call the [facility] if you are having loose stools/diarrhea that you can't get under control.  Loose stools/diarrhea leads to dehydration (loss of water) in your body.  We have other options of trying to get the loose stools/diarrhea to stop but you must let us know!   Constipation Sheet  Colace - 100 mg capsules - take 2 capsules daily.  If this doesn't help then you can increase to 2 capsules twice daily.  Please call if the above does not work for you. Do not go more than 2 days without a bowel movement.  It is very important that you do not become constipated.  It will make you feel sick to your stomach (nausea) and can cause abdominal pain and vomiting.  Nausea Sheet   Compazine/Prochlorperazine '10mg'$  tablet. Take 1 tablet every 6 hours as needed for nausea/vomiting (This can make you drowsy).  If you are having persistent nausea (nausea that does not stop) please call the [facility] and let us know the amount of nausea that you are experiencing.  If you begin to vomit, you need to call the [facility] and if it is the weekend and you have vomited more than one time and can't get it to stop-go to the Emergency Room.  Persistent nausea/vomiting can lead to dehydration (loss of fluid in your body) and will make you feel very weak and unwell. Ice chips, sips of clear liquids, foods that are at room temperature, crackers, and toast tend to be better tolerated.   SYMPTOMS TO REPORT AS SOON AS POSSIBLE AFTER TREATMENT:  FEVER GREATER THAN 100.5 F  CHILLS WITH OR WITHOUT FEVER  NAUSEA AND VOMITING THAT IS NOT CONTROLLED WITH YOUR NAUSEA MEDICATION  UNUSUAL SHORTNESS OF BREATH  UNUSUAL BRUISING OR BLEEDING  TENDERNESS IN MOUTH AND THROAT WITH OR WITHOUT   PRESENCE OF ULCERS  URINARY PROBLEMS  BOWEL PROBLEMS  UNUSUAL RASH      Wear comfortable clothing and clothing appropriate for easy access to any Portacath or PICC line. Let us know if there is anything that we can do to make your therapy better!    What to do if you need assistance after hours or on the weekends: CALL [phone].  HOLD on the line, do not hang up.  You will hear multiple messages but at the end you will be connected with a nurse triage line.  They will contact the doctor if necessary.  Most of the time they will be able to assist you.  Do not call the hospital operator.      I have been informed and understand all of the instructions given to me and have received a copy. I have been instructed to call the clinic [phone] or my family physician as soon as possible for continued medical care, if indicated. I do not have any more questions at this  time but understand that I may call the [facility] or the Patient Navigator at [phone] during office hours should I have questions or need assistance in obtaining follow-up care.

## 2019-06-27 ENCOUNTER — Other Ambulatory Visit: Payer: Self-pay

## 2019-06-27 ENCOUNTER — Inpatient Hospital Stay (HOSPITAL_COMMUNITY): Payer: Medicare Other

## 2019-06-27 ENCOUNTER — Ambulatory Visit (HOSPITAL_COMMUNITY): Payer: Medicare Other | Admitting: Hematology

## 2019-06-27 ENCOUNTER — Ambulatory Visit (HOSPITAL_COMMUNITY): Payer: Medicare Other

## 2019-06-27 DIAGNOSIS — C9000 Multiple myeloma not having achieved remission: Secondary | ICD-10-CM

## 2019-06-28 ENCOUNTER — Other Ambulatory Visit: Payer: Self-pay | Admitting: *Deleted

## 2019-06-28 ENCOUNTER — Other Ambulatory Visit (HOSPITAL_COMMUNITY): Payer: Self-pay | Admitting: Hematology

## 2019-06-28 NOTE — Patient Outreach (Signed)
Courtney Arnold [facility]) Care Management  06/28/2019  EDA MAGNUSSEN Jul 17, 1940 [id number]   [facility] complex care outreach for skilled nursing facility discharged patient   Referral Date: 06/05/19 Referral Source: Marthenia Rolling, [facility] post acute care coordinator Date of Admission:   05/21/19 Reason for consult Please assign to [facility] RN CM   Diagnoses A FIB, HTN, femur fracture   Please assign to [facility] RN CM. Discharged from [facility] SNF on Saturday 06/02/19 prior to writer's outreach. Lives alone. Home health arranged.   Insurance: [employer]  Last admission at [facility] 05/10/19- 05/21/19 for fall displaced spiral fracture of shaft of right femur 05/03/19 ED for abdominal pain  05/01/19 ED for abdominal pain,anemia, thrombocytopenia   Outreach successful Spoke with her daughter [name] with the permission of Mrs Feven Alderfer is able to verify HIPAA ([place] and Accountability Act) identifiers, date of birth (DOB) and address Vivien Rota inquired about the purpose of the call and [facility] RN CM reviewed with her that Mrs Grether was referred to [facility] for transition of care outreach after her [facility] snf discharge. [facility] RN CM discussed transition of care snf services and examples of gaps and goals patients are assisted with. Vivien Rota voiced understanding  Vivien Rota discusses that Mrs Regnier is being seen and called by various agencies and medical personnel. Vivien Rota reports Mrs Pendell is cautious about speaking on the telephone to others. "Probably would not do it"  Vivien Rota voices she understands that [facility] assists with "advocacy" but reports Mrs Lanuza is doing well and she assists her. [facility] RN CM thanked her for assisting Mrs Debruler. [facility] RN CM discussed future outreaches. Vivien Rota agrees to every "six months" outreaches and states she will also outreach to [place] CM prn at [place] office number   Vivien Rota denies Mrs Tippy has any medical, DME, educational or social needs at this time that is not being  addressed by home health or MD   [facility] RN CM will follow up with Mrs Harroun and her daughter [name] within the next 6 months as requested by Vivien Rota today during the outreach Pt encouraged to return a call to [facility] RN CM prn Routed note to MD  [facility] CM Care Plan Problem One     Most Recent Value  Care Plan Problem One  knowledge deficit of home care for right femur fracture,myeloma, atrial fibrillation  Role Documenting the Problem One  Care Management Telephonic Coordinator  Care Plan for Problem One  Active  [facility] Long Term Goal   over the next 180 days patient will be able to verbalize with outreach interventions to manage femur fracture and myeloma at  home  Time Start Date  06/14/19  Interventions for Problem One Long Term Goal  assessed for medical, social, DME needs Reviewed [facility] transition of care with [place] Answered Toni's questions  [facility] CM Short Term Goal #1   over the next 31 days patient will follow up with primary care MD as evidence by verbalization of completed visit   [facility] CM Short Term Goal #1 Start Date  06/14/19  [facility] CM Short Term Goal #1 Met Date  06/28/19       Joelene Millin L. Lavina Hamman, RN, BSN, [place] Coordinator Office number [phone] Mobile number [phone]  Main [facility] number [phone] Fax number [phone]

## 2019-06-29 DIAGNOSIS — J9601 Acute respiratory failure with hypoxia: Secondary | ICD-10-CM | POA: Diagnosis not present

## 2019-06-29 DIAGNOSIS — E039 Hypothyroidism, unspecified: Secondary | ICD-10-CM | POA: Diagnosis not present

## 2019-06-29 DIAGNOSIS — S72301D Unspecified fracture of shaft of right femur, subsequent encounter for closed fracture with routine healing: Secondary | ICD-10-CM | POA: Diagnosis not present

## 2019-06-29 DIAGNOSIS — I4891 Unspecified atrial fibrillation: Secondary | ICD-10-CM | POA: Diagnosis not present

## 2019-06-29 DIAGNOSIS — G8929 Other chronic pain: Secondary | ICD-10-CM | POA: Diagnosis not present

## 2019-06-29 DIAGNOSIS — M545 Low back pain: Secondary | ICD-10-CM | POA: Diagnosis not present

## 2019-07-04 ENCOUNTER — Other Ambulatory Visit: Payer: Self-pay

## 2019-07-04 ENCOUNTER — Inpatient Hospital Stay (HOSPITAL_COMMUNITY): Payer: Medicare Other

## 2019-07-04 ENCOUNTER — Inpatient Hospital Stay (HOSPITAL_BASED_OUTPATIENT_CLINIC_OR_DEPARTMENT_OTHER): Payer: Medicare Other | Admitting: Hematology

## 2019-07-04 ENCOUNTER — Encounter (HOSPITAL_COMMUNITY): Payer: Self-pay | Admitting: Hematology

## 2019-07-04 DIAGNOSIS — C9000 Multiple myeloma not having achieved remission: Secondary | ICD-10-CM

## 2019-07-04 DIAGNOSIS — I4891 Unspecified atrial fibrillation: Secondary | ICD-10-CM | POA: Diagnosis not present

## 2019-07-04 DIAGNOSIS — Z5112 Encounter for antineoplastic immunotherapy: Secondary | ICD-10-CM | POA: Diagnosis not present

## 2019-07-04 DIAGNOSIS — E538 Deficiency of other specified B group vitamins: Secondary | ICD-10-CM | POA: Diagnosis not present

## 2019-07-04 DIAGNOSIS — I10 Essential (primary) hypertension: Secondary | ICD-10-CM | POA: Diagnosis not present

## 2019-07-04 DIAGNOSIS — D649 Anemia, unspecified: Secondary | ICD-10-CM | POA: Diagnosis not present

## 2019-07-04 LAB — CBC WITH DIFFERENTIAL/PLATELET
Abs Immature Granulocytes: 0.01 10*3/uL (ref 0.00–0.07)
Basophils Absolute: 0 10*3/uL (ref 0.0–0.1)
Basophils Relative: 0 %
Eosinophils Absolute: 0.2 10*3/uL (ref 0.0–0.5)
Eosinophils Relative: 5 %
HCT: 28.5 % — ABNORMAL LOW (ref 36.0–46.0)
Hemoglobin: 8.9 g/dL — ABNORMAL LOW (ref 12.0–15.0)
Immature Granulocytes: 0 %
Lymphocytes Relative: 34 %
Lymphs Abs: 1.1 10*3/uL (ref 0.7–4.0)
MCH: 29.2 pg (ref 26.0–34.0)
MCHC: 31.2 g/dL (ref 30.0–36.0)
MCV: 93.4 fL (ref 80.0–100.0)
Monocytes Absolute: 0.2 10*3/uL (ref 0.1–1.0)
Monocytes Relative: 5 %
Neutro Abs: 1.7 10*3/uL (ref 1.7–7.7)
Neutrophils Relative %: 56 %
Platelets: 154 10*3/uL (ref 150–400)
RBC: 3.05 MIL/uL — ABNORMAL LOW (ref 3.87–5.11)
RDW: 15.9 % — ABNORMAL HIGH (ref 11.5–15.5)
WBC: 3.1 10*3/uL — ABNORMAL LOW (ref 4.0–10.5)
nRBC: 0 % (ref 0.0–0.2)

## 2019-07-04 LAB — COMPREHENSIVE METABOLIC PANEL
ALT: 7 U/L (ref 0–44)
AST: 15 U/L (ref 15–41)
Albumin: 3.8 g/dL (ref 3.5–5.0)
Alkaline Phosphatase: 108 U/L (ref 38–126)
Anion gap: 13 (ref 5–15)
BUN: 19 mg/dL (ref 8–23)
CO2: 26 mmol/L (ref 22–32)
Calcium: 9.8 mg/dL (ref 8.9–10.3)
Chloride: 95 mmol/L — ABNORMAL LOW (ref 98–111)
Creatinine, Ser: 0.68 mg/dL (ref 0.44–1.00)
GFR calc Af Amer: >60 mL/min (ref >60)
GFR calc non Af Amer: >60 mL/min (ref >60)
Glucose, Bld: 140 mg/dL — ABNORMAL HIGH (ref 70–99)
Potassium: 4 mmol/L (ref 3.5–5.1)
Sodium: 134 mmol/L — ABNORMAL LOW (ref 135–145)
Total Bilirubin: 0.5 mg/dL (ref 0.3–1.2)
Total Protein: 7.3 g/dL (ref 6.5–8.1)

## 2019-07-04 MED ORDER — BORTEZOMIB CHEMO SQ INJECTION 3.5 MG (2.5MG/ML)
1.3000 mg/m2 | Freq: Once | INTRAMUSCULAR | Status: AC
  Administered 2019-07-04: 2.25 mg via SUBCUTANEOUS
  Filled 2019-07-04: qty 0.9

## 2019-07-04 MED ORDER — CYANOCOBALAMIN 1000 MCG/ML IJ SOLN
1000.0000 ug | Freq: Once | INTRAMUSCULAR | Status: AC
  Administered 2019-07-04: 10:00:00 1000 ug via INTRAMUSCULAR

## 2019-07-04 MED ORDER — CYANOCOBALAMIN 1000 MCG/ML IJ SOLN
INTRAMUSCULAR | Status: AC
  Filled 2019-07-04: qty 1

## 2019-07-04 MED ORDER — PROCHLORPERAZINE MALEATE 10 MG PO TABS
10.0000 mg | ORAL_TABLET | Freq: Once | ORAL | Status: AC
  Administered 2019-07-04: 10 mg via ORAL
  Filled 2019-07-04: qty 1

## 2019-07-04 NOTE — Progress Notes (Signed)
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  CURRENT THERAPY: RVD.   INTERVAL HISTORY:  Courtney Arnold 79 y.o. female seen for follow-up of plasma cell myeloma.  She has started Revlimid today.  Appetite and energy levels are 100%.  Reports that she is not requiring pain medication on regular basis.  Does not have any pain today.  She is able to walk with the help of walker.    REVIEW OF SYSTEMS:  Review of Systems  All other systems reviewed and are negative.    PAST MEDICAL/SURGICAL HISTORY:  Past Medical History:  Diagnosis Date  . Atrial fibrillation ([place]) 2011   Postop, spontaneous conversion to normal sinus after one hour  . Compression fracture 07/24/09   T12; kyphoplasty  . History of echocardiogram 5/11   EF 65%  . Hypertension   . Thyroid disease   . Tobacco abuse    Past Surgical History:  Procedure Laterality Date  . BACK SURGERY    . BACK SURGERY  06/06/2015  . BREAST EXCISIONAL BIOPSY Left    50 years ago  benign  . CHOLECYSTECTOMY N/A 04/25/2015   Procedure: LAPAROSCOPIC CHOLECYSTECTOMY WITH INTRAOPERATIVE CHOLANGIOGRAM;  Surgeon: Mickeal Skinner, MD;  Location: [facility];  Service: General;  Laterality: N/A;  . COLONOSCOPY N/A 11/26/2015   Procedure: COLONOSCOPY;  Surgeon: Daneil Dolin, MD;  Location: [facility];  Service: Endoscopy;  Laterality: N/A;  7:30 am  . ERCP N/A 04/14/2015   Procedure: ENDOSCOPIC RETROGRADE CHOLANGIOPANCREATOGRAPHY (ERCP) Biliary Sphincterotomy, 10x7 stent placement Dilated bilary system just not well seen;  Surgeon: Rogene Houston, MD;  Location: [facility];  Service: Endoscopy;  Laterality: N/A;  . ERCP N/A 06/12/2015   Procedure: ENDOSCOPIC RETROGRADE CHOLANGIOPANCREATOGRAPHY (ERCP);  Surgeon: Rogene Houston, MD;   Location: [facility];  Service: Endoscopy;  Laterality: N/A;  . ESOPHAGOGASTRODUODENOSCOPY N/A 06/12/2015   Procedure: DIAGNOSTIC ESOPHAGOGASTRODUODENOSCOPY (EGD);  Surgeon: Rogene Houston, MD;  Location[facility];  Service: Endoscopy;  Laterality: N/A;  . FEMUR IM NAIL Right 05/11/2019   Procedure: RETROGRADE INTRAMEDULLARY NAIL FEMORAL;  Surgeon: Shona Needles, MD;  Location: [place];  Service: Orthopedics;  Laterality: Right;  . STENT REMOVAL  06/12/2015   Procedure: STENT REMOVAL ;  Surgeon: Rogene Houston, MD;  Location[facility];  Service: Endoscopy;;     SOCIAL HISTORY:  Social History   Socioeconomic History  . Marital status: Widowed    Spouse name: Not on file  . Number of children: 2  . Years of education: Not on file  . Highest education level: Not on file  Occupational History  . Occupation: retired  Tobacco Use  . Smoking status: Former Smoker    Packs/day: 0.15    Years: 20.00    Pack years: 3.00    Quit date: 03/08/2013    Years since quitting: 6.3  . Smokeless tobacco: Never Used  Substance and Sexual Activity  . Alcohol use: No    Alcohol/week: 0.0 standard drinks  . Drug use: No  . Sexual activity: Not Currently  Other Topics Concern  . Not on file  Social History Narrative   Active in gardens and does yard work.   Social Determinants of Health   Financial Resource Strain: Low Risk   .  Difficulty of Paying Living Expenses: Not hard at all  Food Insecurity: No Food Insecurity  . Worried About Charity fundraiser in the Last Year: Never true  . Ran Out of Food in the Last Year: Never true  Transportation Needs: No Transportation Needs  . Lack of Transportation (Medical): No  . Lack of Transportation (Non-Medical): No  Physical Activity: Inactive  . Days of Exercise per Week: 0 days  . Minutes of Exercise per Session: 0 min  Stress: No Stress Concern Present  . Feeling of Stress : Not at all  Social Connections:   . Frequency of  Communication with Friends and Family:   . Frequency of Social Gatherings with Friends and Family:   . Attends Religious Services:   . Active Member of Clubs or Organizations:   . Attends Archivist Meetings:   Marland Kitchen Marital Status:   Intimate Partner Violence: Not At Risk  . Fear of Current or Ex-Partner: No  . Emotionally Abused: No  . Physically Abused: No  . Sexually Abused: No    FAMILY HISTORY:  Family History  Problem Relation Age of Onset  . Heart attack Father   . Stroke Father   . Breast cancer Sister   . Thyroid disease Neg Hx   . Colon cancer Neg Hx     CURRENT MEDICATIONS:  Outpatient Encounter Medications as of 07/04/2019  Medication Sig  . acyclovir (ZOVIRAX) 400 MG tablet Take 1 tablet (400 mg total) by mouth 2 (two) times daily.  Marland Kitchen aspirin EC 81 MG tablet Take 81 mg by mouth daily.  . bortezomib IV (VELCADE) 3.5 MG injection Inject 1.3 mg/m2 into the vein once a week. Day 1, 8, 15 every 21 days  . carboxymethylcellulose (REFRESH PLUS) 0.5 % SOLN Place 1 drop into both eyes in the morning, at noon, in the evening, and at bedtime.  . Cholecalciferol (VITAMIN D) 125 MCG (5000 UT) CAPS Take 5,000 Units by mouth daily.  Marland Kitchen dexamethasone (DECADRON) 4 MG tablet Take 5 tablets (20 mg) on days 1, 8, and 15 of chemo. Repeat every 21 days.  Marland Kitchen lenalidomide (REVLIMID) 15 MG capsule Take one capsule daily on days 1-14 every 21 days.  Marland Kitchen levothyroxine (SYNTHROID) 100 MCG tablet Take 1 tablet (100 mcg total) by mouth daily.  . Lidocaine HCl-Benzyl Alcohol (SALONPAS LIDOCAINE PLUS) 4-10 % CREA Apply 1 patch topically daily.  . NON FORMULARY Diet -Regular  . ALPRAZolam (XANAX) 0.5 MG tablet Take 1 tablet (0.5 mg total) by mouth at bedtime. (Patient not taking: Reported on 06/21/2019)  . cyclobenzaprine (FLEXERIL) 5 MG tablet Take 1 tablet (5 mg total) by mouth 3 (three) times daily as needed for muscle spasms. (Patient not taking: Reported on 06/21/2019)  .  HYDROcodone-acetaminophen (NORCO/VICODIN) 5-325 MG tablet Take 1 tablet by mouth every 6 (six) hours as needed for severe pain. (Patient not taking: Reported on 07/04/2019)  . Lactulose 20 GM/30ML SOLN Take 30 mLs (20 g total) by mouth every 3 (three) hours. Until you produce a bowel movement (Patient not taking: Reported on 06/21/2019)  . prochlorperazine (COMPAZINE) 10 MG tablet Take 1 tablet (10 mg total) by mouth every 6 (six) hours as needed (Nausea or vomiting). (Patient not taking: Reported on 07/04/2019)   No facility-administered encounter medications on file as of 07/04/2019.    ALLERGIES:  No Known Allergies   PHYSICAL EXAM:  ECOG Performance status: 2  Vitals:   07/04/19 0853  BP: 105/79  Pulse: 93  Resp: 18  Temp: (!) 96.6 F (35.9 C)  SpO2: 98%   Filed Weights   07/04/19 0853  Weight: 130 lb 9.6 oz (59.2 kg)    Physical Exam Vitals reviewed.  Constitutional:      Appearance: Normal appearance.  Cardiovascular:     Rate and Rhythm: Normal rate and regular rhythm.     Heart sounds: Normal heart sounds.  Pulmonary:     Effort: Pulmonary effort is normal.     Breath sounds: Normal breath sounds.  Abdominal:     General: There is no distension.     Palpations: Abdomen is soft.  Skin:    General: Skin is warm.  Neurological:     General: No focal deficit present.     Mental Status: She is alert and oriented to person, place, and time.  Psychiatric:        Mood and Affect: Mood normal.        Behavior: Behavior normal.      LABORATORY DATA:  I have reviewed the labs as listed.  CBC    Component Value Date/Time   WBC 3.1 (L) 07/04/2019 0828   RBC 3.05 (L) 07/04/2019 0828   HGB 8.9 (L) 07/04/2019 0828   HCT 28.5 (L) 07/04/2019 0828   PLT 154 07/04/2019 0828   MCV 93.4 07/04/2019 0828   MCH 29.2 07/04/2019 0828   MCHC 31.2 07/04/2019 0828   RDW 15.9 (H) 07/04/2019 0828   LYMPHSABS 1.1 07/04/2019 0828   MONOABS 0.2 07/04/2019 0828   EOSABS 0.2  07/04/2019 0828   BASOSABS 0.0 07/04/2019 0828   CMP Latest Ref Rng & Units 07/04/2019 05/28/2019 05/24/2019  Glucose 70 - 99 mg/dL 140(H) - 102(H)  BUN 8 - 23 mg/dL 19 - 15  Creatinine 0.44 - 1.00 mg/dL 0.68 - 0.52  Sodium 135 - 145 mmol/L 134(L) - 137  Potassium 3.5 - 5.1 mmol/L 4.0 3.5 2.9(L)  Chloride 98 - 111 mmol/L 95(L) - 95(L)  CO2 22 - 32 mmol/L 26 - 33(H)  Calcium 8.9 - 10.3 mg/dL 9.8 - 8.9  Total Protein 6.5 - 8.1 g/dL 7.3 - -  Total Bilirubin 0.3 - 1.2 mg/dL 0.5 - -  Alkaline Phos 38 - 126 U/L 108 - -  AST 15 - 41 U/L 15 - -  ALT 0 - 44 U/L 7 - -       DIAGNOSTIC IMAGING:  I have reviewed scans.   ASSESSMENT & PLAN:   Multiple myeloma not having achieved remission ([place]) 1.  IgA kappa plasma cell myeloma: -Right sacral bone lesion biopsy on 05/17/2019 showed kappa restricted plasma cell neoplasm. -SPEP did not show any identifiable M spike.  Immunofixation shows IgA kappa monoclonal protein.  Kappa light chains 42.8, lambda light chains 9.9, ratio 4.32. -PET scan on 06/04/2019 showed expansile lytic lesion involving the right sacral wing.  Lytic lesion involving T10 vertebral body.  Left seventh rib lesion.  Increased uptake in a pathological fracture involving distal aspect of the right femur. -FISH panel showed loss of 1P, gain of 1 q., hyperdiploidy/monosomy of 13 q. and 14 representing high risk. -Bone marrow biopsy FISH panel also showed same findings.  Chromosome analysis was 97, XX. -24-hour urine was positive for Bence-Jones proteins.  LDH was normal.  Beta-2 microglobulin is 3.7. -She started Revlimid 15 mg 2 weeks on 1 week off today.  She is taking dexamethasone 5 tablets today. -I have reviewed her labs.  She will proceed with Velcade today.  We will reevaluate her in 1 week.  2.  Normocytic anemia: -CBC today shows hemoglobin 8.2.  MCV is normal.  Prior ferritin was 64 and percent saturation 9.  Will consider parenteral iron therapy.  3.  Right femur  fracture: -She is able to walk with help of walker.  4.  ID prophylaxis: -She will take acyclovir 400 mg twice daily and aspirin 81 mg for thromboprophylaxis.  5.  Left posterior rib pain: -She is not requiring hydrocodone on a daily basis.      Orders placed this encounter:  No orders of the defined types were placed in this encounter.    Derek Jack, MD [place] [phone]

## 2019-07-04 NOTE — Patient Instructions (Addendum)
[place] at [facility] Discharge Instructions  You were seen today by Dr. Delton Coombes. He went over your recent lab results. He reviewed the injection that you were given today and your pills you started today as well. He will see you back in 1 week for labs and follow up.   Thank you for choosing [place] at [facility] to provide your oncology and hematology care.  To afford each patient quality time with our provider, please arrive at least 15 minutes before your scheduled appointment time.   If you have a lab appointment with the [facility] please come in thru the  Main Entrance and check in at the main information desk  You need to re-schedule your appointment should you arrive 10 or more minutes late.  We strive to give you quality time with our providers, and arriving late affects you and other patients whose appointments are after yours.  Also, if you no show three or more times for appointments you may be dismissed from the clinic at the providers discretion.     Again, thank you for choosing [facility].  Our hope is that these requests will decrease the amount of time that you wait before being seen by our physicians.       _____________________________________________________________  Should you have questions after your visit to [facility], please contact our office at [phone] between the hours of 8:00 a.m. and 4:30 p.m.  Voicemails left after 4:00 p.m. will not be returned until the following business day.  For prescription refill requests, have your pharmacy contact our office and allow 72 hours.    Cancer Center Support Programs:   > Cancer Support Group  2nd Tuesday of the month 1pm-2pm, Journey Room

## 2019-07-04 NOTE — Progress Notes (Signed)

## 2019-07-04 NOTE — Assessment & Plan Note (Signed)
1.  IgA kappa plasma cell myeloma: -Right sacral bone lesion biopsy on 05/17/2019 showed kappa restricted plasma cell neoplasm. -SPEP did not show any identifiable M spike.  Immunofixation shows IgA kappa monoclonal protein.  Kappa light chains 42.8, lambda light chains 9.9, ratio 4.32. -PET scan on 06/04/2019 showed expansile lytic lesion involving the right sacral wing.  Lytic lesion involving T10 vertebral body.  Left seventh rib lesion.  Increased uptake in a pathological fracture involving distal aspect of the right femur. -FISH panel showed loss of 1P, gain of 1 q., hyperdiploidy/monosomy of 13 q. and 14 representing high risk. -Bone marrow biopsy FISH panel also showed same findings.  Chromosome analysis was 15, XX. -24-hour urine was positive for Bence-Jones proteins.  LDH was normal.  Beta-2 microglobulin is 3.7. -She started Revlimid 15 mg 2 weeks on 1 week off today.  She is taking dexamethasone 5 tablets today. -I have reviewed her labs.  She will proceed with Velcade today.  We will reevaluate her in 1 week.  2.  Normocytic anemia: -CBC today shows hemoglobin 8.2.  MCV is normal.  Prior ferritin was 64 and percent saturation 9.  Will consider parenteral iron therapy.  3.  Right femur fracture: -She is able to walk with help of walker.  4.  ID prophylaxis: -She will take acyclovir 400 mg twice daily and aspirin 81 mg for thromboprophylaxis.  5.  Left posterior rib pain: -She is not requiring hydrocodone on a daily basis.

## 2019-07-04 NOTE — Progress Notes (Signed)
Patient tolerated Velcade and B12 injection with no complaints voiced.  Lab work reviewed.  See MAR for details.  Injection site clean and dry with no bruising or swelling noted.  Band aid applied.  VSS.  Patient left by wheelchair with no s/s of distress noted.

## 2019-07-11 ENCOUNTER — Inpatient Hospital Stay (HOSPITAL_COMMUNITY): Payer: Medicare Other

## 2019-07-11 ENCOUNTER — Ambulatory Visit (HOSPITAL_COMMUNITY): Payer: Medicare Other

## 2019-07-11 ENCOUNTER — Inpatient Hospital Stay (HOSPITAL_BASED_OUTPATIENT_CLINIC_OR_DEPARTMENT_OTHER): Payer: Medicare Other | Admitting: Hematology

## 2019-07-11 ENCOUNTER — Other Ambulatory Visit: Payer: Self-pay

## 2019-07-11 ENCOUNTER — Inpatient Hospital Stay (HOSPITAL_COMMUNITY): Payer: Medicare Other | Attending: Hematology

## 2019-07-11 ENCOUNTER — Other Ambulatory Visit (HOSPITAL_COMMUNITY): Payer: Medicare Other

## 2019-07-11 ENCOUNTER — Encounter (HOSPITAL_COMMUNITY): Payer: Self-pay | Admitting: Hematology

## 2019-07-11 DIAGNOSIS — Z79899 Other long term (current) drug therapy: Secondary | ICD-10-CM | POA: Diagnosis not present

## 2019-07-11 DIAGNOSIS — Z5112 Encounter for antineoplastic immunotherapy: Secondary | ICD-10-CM | POA: Insufficient documentation

## 2019-07-11 DIAGNOSIS — C9000 Multiple myeloma not having achieved remission: Secondary | ICD-10-CM

## 2019-07-11 LAB — CBC WITH DIFFERENTIAL/PLATELET
Abs Immature Granulocytes: 0.03 10*3/uL (ref 0.00–0.07)
Basophils Absolute: 0 10*3/uL (ref 0.0–0.1)
Basophils Relative: 0 %
Eosinophils Absolute: 0 10*3/uL (ref 0.0–0.5)
Eosinophils Relative: 1 %
HCT: 28.1 % — ABNORMAL LOW (ref 36.0–46.0)
Hemoglobin: 8.7 g/dL — ABNORMAL LOW (ref 12.0–15.0)
Immature Granulocytes: 1 %
Lymphocytes Relative: 11 %
Lymphs Abs: 0.5 10*3/uL — ABNORMAL LOW (ref 0.7–4.0)
MCH: 28.5 pg (ref 26.0–34.0)
MCHC: 31 g/dL (ref 30.0–36.0)
MCV: 92.1 fL (ref 80.0–100.0)
Monocytes Absolute: 0.1 10*3/uL (ref 0.1–1.0)
Monocytes Relative: 1 %
Neutro Abs: 4.1 10*3/uL (ref 1.7–7.7)
Neutrophils Relative %: 86 %
Platelets: 159 10*3/uL (ref 150–400)
RBC: 3.05 MIL/uL — ABNORMAL LOW (ref 3.87–5.11)
RDW: 15.7 % — ABNORMAL HIGH (ref 11.5–15.5)
WBC: 4.7 10*3/uL (ref 4.0–10.5)
nRBC: 0 % (ref 0.0–0.2)

## 2019-07-11 LAB — COMPREHENSIVE METABOLIC PANEL
ALT: 12 U/L (ref 0–44)
AST: 13 U/L — ABNORMAL LOW (ref 15–41)
Albumin: 4.2 g/dL (ref 3.5–5.0)
Alkaline Phosphatase: 113 U/L (ref 38–126)
Anion gap: 8 (ref 5–15)
BUN: 25 mg/dL — ABNORMAL HIGH (ref 8–23)
CO2: 25 mmol/L (ref 22–32)
Calcium: 8.7 mg/dL — ABNORMAL LOW (ref 8.9–10.3)
Chloride: 98 mmol/L (ref 98–111)
Creatinine, Ser: 0.67 mg/dL (ref 0.44–1.00)
GFR calc Af Amer: >60 mL/min (ref >60)
GFR calc non Af Amer: >60 mL/min (ref >60)
Glucose, Bld: 147 mg/dL — ABNORMAL HIGH (ref 70–99)
Potassium: 4 mmol/L (ref 3.5–5.1)
Sodium: 131 mmol/L — ABNORMAL LOW (ref 135–145)
Total Bilirubin: 0.8 mg/dL (ref 0.3–1.2)
Total Protein: 7.2 g/dL (ref 6.5–8.1)

## 2019-07-11 MED ORDER — BORTEZOMIB CHEMO SQ INJECTION 3.5 MG (2.5MG/ML)
1.3000 mg/m2 | Freq: Once | INTRAMUSCULAR | Status: AC
  Administered 2019-07-11: 2.25 mg via SUBCUTANEOUS
  Filled 2019-07-11: qty 0.9

## 2019-07-11 MED ORDER — PROCHLORPERAZINE MALEATE 10 MG PO TABS
10.0000 mg | ORAL_TABLET | Freq: Once | ORAL | Status: AC
  Administered 2019-07-11: 14:00:00 10 mg via ORAL
  Filled 2019-07-11: qty 1

## 2019-07-11 NOTE — Progress Notes (Signed)
1340 Labs reviewed with and pt seen by Dr. Delton Coombes and pt approved for Velcade injection today per MD                                  Courtney Arnold tolerated Velcade injection well without complaints or incident. Pt discharged via wheelchair in satisfactory condition accompanied by family member

## 2019-07-11 NOTE — Assessment & Plan Note (Addendum)
1.  IgA kappa plasma cell myeloma: -Right sacral bone biopsy on 05/17/2019 + for kappa restricted plasma cell neoplasm. -SPEP does not show any identifiable M spike.  Immunofixation-IgA kappa.  Kappa light chains 42.8, lambda light chains 9.9 ratio 4.32.  Beta-2 microglobulin is 3.7. -PET scan on 06/04/2019 showed expansile lytic lesion involving the right sacral wing.  Lytic lesion involving T10 vertebral body.  Left seventh rib lesion.  Increased uptake in a pathological fracture involving distal aspect of the right femur. 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 -Cycle 1 of RVD on 07/04/2019.  Revlimid 15 mg 2 weeks on 1 week off.  She had not experienced any side effects from last week.  She is tolerating Revlimid very well.  She had a spike in energy levels after taking steroid pills last week.  Denies any new onset pains. -We have reviewed her labs.  She will proceed with her Velcade today.  Next week will be her off week. -I have told her to cut back on dexamethasone to 2-1/2 tablets (10 mg) next week.  She will take 5 tablets of dexamethasone during treatment weeks.  I will see her back in 2 weeks for follow-up.  2.  Normocytic anemia: -Hemoglobin today is 8.7.  Prior ferritin is 64 and percent saturation is 9. -We will consider parenteral iron therapy if further declines.  3.  Right femur fracture: -She is walking with the help of walker without any problems.  4.  ID prophylaxis: -Continue acyclovir twice daily and aspirin 81 mg for thromboprophylaxis.  5.  Left posterior rib pain: -She is not requiring hydrocodone on a daily basis.

## 2019-07-11 NOTE — Patient Instructions (Signed)
[facility] at [facility] Discharge Instructions  You were seen today by Dr. Katragadda. He went over your recent lab results. He will see you back in 2 weeks for labs, treatment and follow up.   Thank you for choosing [facility] at [facility] to provide your oncology and hematology care.  To afford each patient quality time with our provider, please arrive at least 15 minutes before your scheduled appointment time.   If you have a lab appointment with the Cancer Center please come in thru the  Main Entrance and check in at the main information desk  You need to re-schedule your appointment should you arrive 10 or more minutes late.  We strive to give you quality time with our providers, and arriving late affects you and other patients whose appointments are after yours.  Also, if you no show three or more times for appointments you may be dismissed from the clinic at the providers discretion.     Again, thank you for choosing [facility].  Our hope is that these requests will decrease the amount of time that you wait before being seen by our physicians.       _____________________________________________________________  Should you have questions after your visit to [facility], please contact our office at [phone] between the hours of 8:00 a.m. and 4:30 p.m.  Voicemails left after 4:00 p.m. will not be returned until the following business day.  For prescription refill requests, have your pharmacy contact our office and allow 72 hours.    Cancer Center Support Programs:   > Cancer Support Group  2nd Tuesday of the month 1pm-2pm, Journey Room    

## 2019-07-11 NOTE — Progress Notes (Signed)
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  CURRENT THERAPY: RVD.   INTERVAL HISTORY:  Courtney Arnold 79 y.o. female seen for follow-up of plasma cell myeloma.  She started her treatment last week.  She is feeling great with appetite and energy levels of 100%.  No pain is reported.  She is not requiring hydrocodone on a daily basis.  After taking dexamethasone last week, she reported improvement in her energy levels.  Denies any GI symptoms.    REVIEW OF SYSTEMS:  Review of Systems  All other systems reviewed and are negative.    PAST MEDICAL/SURGICAL HISTORY:  Past Medical History:  Diagnosis Date  . Atrial fibrillation ([facility]) 2011   Postop, spontaneous conversion to normal sinus after one hour  . Compression fracture 07/24/09   T12; kyphoplasty  . History of echocardiogram 5/11   EF 65%  . Hypertension   . Thyroid disease   . Tobacco abuse    Past Surgical History:  Procedure Laterality Date  . BACK SURGERY    . BACK SURGERY  06/06/2015  . BREAST EXCISIONAL BIOPSY Left    50 years ago  benign  . CHOLECYSTECTOMY N/A 04/25/2015   Procedure: LAPAROSCOPIC CHOLECYSTECTOMY WITH INTRAOPERATIVE CHOLANGIOGRAM;  Surgeon: Mickeal Skinner, MD;  Location: [facility];  Service: General;  Laterality: N/A;  . COLONOSCOPY N/A 11/26/2015   Procedure: COLONOSCOPY;  Surgeon: Daneil Dolin, MD;  Location: [facility];  Service: Endoscopy;  Laterality: N/A;  7:30 am  . ERCP N/A 04/14/2015   Procedure: ENDOSCOPIC RETROGRADE CHOLANGIOPANCREATOGRAPHY (ERCP) Biliary Sphincterotomy, 10x7 stent placement Dilated bilary system just not well seen;  Surgeon: Rogene Houston, MD;  Location: [facility];  Service: Endoscopy;  Laterality: N/A;  . ERCP N/A 06/12/2015   Procedure: ENDOSCOPIC RETROGRADE  CHOLANGIOPANCREATOGRAPHY (ERCP);  Surgeon: Rogene Houston, MD;  Location: [facility];  Service: Endoscopy;  Laterality: N/A;  . ESOPHAGOGASTRODUODENOSCOPY N/A 06/12/2015   Procedure: DIAGNOSTIC ESOPHAGOGASTRODUODENOSCOPY (EGD);  Surgeon: Rogene Houston, MD;  [facility];  Service: Endoscopy;  Laterality: N/A;  . FEMUR IM NAIL Right 05/11/2019   Procedure: RETROGRADE INTRAMEDULLARY NAIL FEMORAL;  Surgeon: Shona Needles, MD;  Location: [facility];  Service: Orthopedics;  Laterality: Right;  . STENT REMOVAL  06/12/2015   Procedure: STENT REMOVAL ;  Surgeon: Rogene Houston, MD;  Location: [facility];  Service: Endoscopy;;     SOCIAL HISTORY:  Social History   Socioeconomic History  . Marital status: Widowed    Spouse name: Not on file  . Number of children: 2  . Years of education: Not on file  . Highest education level: Not on file  Occupational History  . Occupation: retired  Tobacco Use  . Smoking status: Former Smoker    Packs/day: 0.15    Years: 20.00    Pack years: 3.00    Quit date: 03/08/2013    Years since quitting: 6.3  . Smokeless tobacco: Never Used  Substance and Sexual Activity  . Alcohol use: No    Alcohol/week: 0.0 standard drinks  . Drug use: No  . Sexual activity: Not Currently  Other Topics Concern  . Not on file  Social History Narrative   Active in gardens and does yard work.   Social Determinants of Health  Financial Resource Strain: Low Risk   . Difficulty of Paying Living Expenses: Not hard at all  Food Insecurity: No Food Insecurity  . Worried About Charity fundraiser in the Last Year: Never true  . Ran Out of Food in the Last Year: Never true  Transportation Needs: No Transportation Needs  . Lack of Transportation (Medical): No  . Lack of Transportation (Non-Medical): No  Physical Activity: Inactive  . Days of Exercise per Week: 0 days  . Minutes of Exercise per Session: 0 min  Stress: No Stress Concern Present  . Feeling of  Stress : Not at all  Social Connections:   . Frequency of Communication with Friends and Family:   . Frequency of Social Gatherings with Friends and Family:   . Attends Religious Services:   . Active Member of Clubs or Organizations:   . Attends Archivist Meetings:   Marland Kitchen Marital Status:   Intimate Partner Violence: Not At Risk  . Fear of Current or Ex-Partner: No  . Emotionally Abused: No  . Physically Abused: No  . Sexually Abused: No    FAMILY HISTORY:  Family History  Problem Relation Age of Onset  . Heart attack Father   . Stroke Father   . Breast cancer Sister   . Thyroid disease Neg Hx   . Colon cancer Neg Hx     CURRENT MEDICATIONS:  Outpatient Encounter Medications as of 07/11/2019  Medication Sig  . acyclovir (ZOVIRAX) 400 MG tablet Take 1 tablet (400 mg total) by mouth 2 (two) times daily.  Marland Kitchen aspirin EC 81 MG tablet Take 81 mg by mouth daily.  . bortezomib IV (VELCADE) 3.5 MG injection Inject 1.3 mg/m2 into the vein once a week. Day 1, 8, 15 every 21 days  . carboxymethylcellulose (REFRESH PLUS) 0.5 % SOLN Place 1 drop into both eyes in the morning, at noon, in the evening, and at bedtime.  . Cholecalciferol (VITAMIN D) 125 MCG (5000 UT) CAPS Take 5,000 Units by mouth daily.  Marland Kitchen dexamethasone (DECADRON) 4 MG tablet Take 5 tablets (20 mg) on days 1, 8, and 15 of chemo. Repeat every 21 days.  Marland Kitchen lenalidomide (REVLIMID) 15 MG capsule Take one capsule daily on days 1-14 every 21 days.  Marland Kitchen levothyroxine (SYNTHROID) 100 MCG tablet Take 1 tablet (100 mcg total) by mouth daily.  . Lidocaine HCl-Benzyl Alcohol (SALONPAS LIDOCAINE PLUS) 4-10 % CREA Apply 1 patch topically daily.  . NON FORMULARY Diet -Regular  . ALPRAZolam (XANAX) 0.5 MG tablet Take 1 tablet (0.5 mg total) by mouth at bedtime. (Patient not taking: Reported on 07/11/2019)  . cyclobenzaprine (FLEXERIL) 5 MG tablet Take 1 tablet (5 mg total) by mouth 3 (three) times daily as needed for muscle spasms. (Patient  not taking: Reported on 06/21/2019)  . HYDROcodone-acetaminophen (NORCO/VICODIN) 5-325 MG tablet Take 1 tablet by mouth every 6 (six) hours as needed for severe pain. (Patient not taking: Reported on 07/04/2019)  . Lactulose 20 GM/30ML SOLN Take 30 mLs (20 g total) by mouth every 3 (three) hours. Until you produce a bowel movement (Patient not taking: Reported on 06/21/2019)  . prochlorperazine (COMPAZINE) 10 MG tablet Take 1 tablet (10 mg total) by mouth every 6 (six) hours as needed (Nausea or vomiting). (Patient not taking: Reported on 07/04/2019)   No facility-administered encounter medications on file as of 07/11/2019.    ALLERGIES:  No Known Allergies   PHYSICAL EXAM:  ECOG Performance status: 2  Vitals:   07/11/19  1309  BP: 109/60  Pulse: 75  Resp: 18  Temp: (!) 96.8 F (36 C)  SpO2: 98%   Filed Weights   07/11/19 1309  Weight: 130 lb 12.8 oz (59.3 kg)    Physical Exam Vitals reviewed.  Constitutional:      Appearance: Normal appearance.  Cardiovascular:     Rate and Rhythm: Normal rate and regular rhythm.     Heart sounds: Normal heart sounds.  Pulmonary:     Effort: Pulmonary effort is normal.     Breath sounds: Normal breath sounds.  Abdominal:     General: There is no distension.     Palpations: Abdomen is soft.  Skin:    General: Skin is warm.  Neurological:     General: No focal deficit present.     Mental Status: She is alert and oriented to person, place, and time.  Psychiatric:        Mood and Affect: Mood normal.        Behavior: Behavior normal.      LABORATORY DATA:  I have reviewed the labs as listed.  CBC    Component Value Date/Time   WBC 4.7 07/11/2019 1145   RBC 3.05 (L) 07/11/2019 1145   HGB 8.7 (L) 07/11/2019 1145   HCT 28.1 (L) 07/11/2019 1145   PLT 159 07/11/2019 1145   MCV 92.1 07/11/2019 1145   MCH 28.5 07/11/2019 1145   MCHC 31.0 07/11/2019 1145   RDW 15.7 (H) 07/11/2019 1145   LYMPHSABS 0.5 (L) 07/11/2019 1145   MONOABS  0.1 07/11/2019 1145   EOSABS 0.0 07/11/2019 1145   BASOSABS 0.0 07/11/2019 1145   CMP Latest Ref Rng & Units 07/11/2019 07/04/2019 05/28/2019  Glucose 70 - 99 mg/dL 147(H) 140(H) -  BUN 8 - 23 mg/dL 25(H) 19 -  Creatinine 0.44 - 1.00 mg/dL 0.67 0.68 -  Sodium 135 - 145 mmol/L 131(L) 134(L) -  Potassium 3.5 - 5.1 mmol/L 4.0 4.0 3.5  Chloride 98 - 111 mmol/L 98 95(L) -  CO2 22 - 32 mmol/L 25 26 -  Calcium 8.9 - 10.3 mg/dL 8.7(L) 9.8 -  Total Protein 6.5 - 8.1 g/dL 7.2 7.3 -  Total Bilirubin 0.3 - 1.2 mg/dL 0.8 0.5 -  Alkaline Phos 38 - 126 U/L 113 108 -  AST 15 - 41 U/L 13(L) 15 -  ALT 0 - 44 U/L 12 7 -       DIAGNOSTIC IMAGING:  I have reviewed scans.   ASSESSMENT & PLAN:   Multiple myeloma not having achieved remission ([facility]) 1.  IgA kappa plasma cell myeloma: -Right sacral bone biopsy on 05/17/2019 + for kappa restricted plasma cell neoplasm. -SPEP does not show any identifiable M spike.  Immunofixation-IgA kappa.  Kappa light chains 42.8, lambda light chains 9.9 ratio 4.32.  Beta-2 microglobulin is 3.7. -PET scan on 06/04/2019 showed expansile lytic lesion involving the right sacral wing.  Lytic lesion involving T10 vertebral body.  Left seventh rib lesion.  Increased uptake in a pathological fracture involving distal aspect of the right femur. 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 -Cycle 1 of RVD on 07/04/2019.  Revlimid 15 mg 2 weeks on 1 week off.  She had not experienced any side effects from last week.  She is tolerating Revlimid very well.  She had a spike in energy levels after taking steroid pills last week.  Denies any new onset pains. -  We have reviewed her labs.  She will proceed with her Velcade today.  Next week will be her off week. -I have told her to cut back on dexamethasone to 2-1/2 tablets (10 mg) next week.  She will take 5 tablets of dexamethasone during treatment weeks.  I will see her back in 2  weeks for follow-up.  2.  Normocytic anemia: -Hemoglobin today is 8.7.  Prior ferritin is 64 and percent saturation is 9. -We will consider parenteral iron therapy if further declines.  3.  Right femur fracture: -She is walking with the help of walker without any problems.  4.  ID prophylaxis: -Continue acyclovir twice daily and aspirin 81 mg for thromboprophylaxis.  5.  Left posterior rib pain: -She is not requiring hydrocodone on a daily basis.      Orders placed this encounter:  No orders of the defined types were placed in this encounter.    Derek Jack, MD [place] [phone]

## 2019-07-11 NOTE — Patient Instructions (Signed)
[facility] Discharge Instructions for Patients Receiving Chemotherapy   Beginning January 23rd 2017 lab work for the Cancer Center will be done in the  Main lab at  on 1st floor. If you have a lab appointment with the Cancer Center please come in thru the  Main Entrance and check in at the main information desk   Today you received the following chemotherapy agents Velcade injection. Follow-up as scheduled. Call clinic for any questions or concerns  To help prevent nausea and vomiting after your treatment, we encourage you to take your nausea medication   If you develop nausea and vomiting, or diarrhea that is not controlled by your medication, call the clinic.  The clinic phone number is [phone]. Office hours are Monday-Friday 8:30am-5:00pm.  BELOW ARE SYMPTOMS THAT SHOULD BE REPORTED IMMEDIATELY:  *FEVER GREATER THAN 101.0 F  *CHILLS WITH OR WITHOUT FEVER  NAUSEA AND VOMITING THAT IS NOT CONTROLLED WITH YOUR NAUSEA MEDICATION  *UNUSUAL SHORTNESS OF BREATH  *UNUSUAL BRUISING OR BLEEDING  TENDERNESS IN MOUTH AND THROAT WITH OR WITHOUT PRESENCE OF ULCERS  *URINARY PROBLEMS  *BOWEL PROBLEMS  UNUSUAL RASH Items with * indicate a potential emergency and should be followed up as soon as possible. If you have an emergency after office hours please contact your primary care physician or go to the nearest emergency department.  Please call the clinic during office hours if you have any questions or concerns.   You may also contact the Patient Navigator at [phone] should you have any questions or need assistance in obtaining follow up care.      Resources For Cancer Patients and their Caregivers ? American Cancer Society: Can assist with transportation, wigs, general needs, runs Look Good Feel Better.        1-888-227-6333 ? Cancer Care: Provides financial assistance, online support groups, medication/co-pay assistance.   1-800-813-HOPE (4673) ? [facility] Assists [place] cancer patients and their families through emotional , educational and financial support.  336-427-4357 ? [place] DSS Where to apply for food stamps, Medicaid and utility assistance. 336-342-1394 ? [facility]: Transportation to medical appointments. 336-347-2287 ? Social Security Administration: May apply for disability if have a Stage IV cancer. 336-342-7796 1-800-772-1213 ? [place] Aging, Disability and Transit Services: Assists with nutrition, care and transit needs. 336-349-2343         

## 2019-07-17 ENCOUNTER — Encounter (HOSPITAL_COMMUNITY): Payer: Self-pay | Admitting: *Deleted

## 2019-07-17 NOTE — Progress Notes (Signed)
Patient's daughter called this morning to confirm appointments.  She wanted clarification on when to take her dexamethasone.  I advised that per Dr. Raliegh Ip he wants her to take 10 mg dexamethasone this week (her off week) and then she will take the full 20mg  next week with her injection.  She verbalizes understanding.

## 2019-07-18 ENCOUNTER — Other Ambulatory Visit (HOSPITAL_COMMUNITY): Payer: Medicare Other

## 2019-07-18 ENCOUNTER — Ambulatory Visit (HOSPITAL_COMMUNITY): Payer: Medicare Other

## 2019-07-18 ENCOUNTER — Other Ambulatory Visit (HOSPITAL_COMMUNITY): Payer: Self-pay | Admitting: *Deleted

## 2019-07-18 DIAGNOSIS — C9000 Multiple myeloma not having achieved remission: Secondary | ICD-10-CM

## 2019-07-18 MED ORDER — LENALIDOMIDE 15 MG PO CAPS: ORAL_CAPSULE | ORAL | 0 refills | Status: DC

## 2019-07-18 NOTE — Telephone Encounter (Signed)
Chart reviewed, revlimid refilled. 

## 2019-07-23 ENCOUNTER — Other Ambulatory Visit (HOSPITAL_COMMUNITY): Payer: Self-pay | Admitting: Hematology

## 2019-07-26 ENCOUNTER — Inpatient Hospital Stay (HOSPITAL_COMMUNITY): Payer: Medicare Other

## 2019-07-26 ENCOUNTER — Inpatient Hospital Stay (HOSPITAL_BASED_OUTPATIENT_CLINIC_OR_DEPARTMENT_OTHER): Payer: Medicare Other | Admitting: Hematology

## 2019-07-26 ENCOUNTER — Encounter (HOSPITAL_COMMUNITY): Payer: Self-pay | Admitting: Hematology

## 2019-07-26 ENCOUNTER — Other Ambulatory Visit: Payer: Self-pay

## 2019-07-26 VITALS — BP 109/43 | HR 76 | Temp 97.1°F | Resp 20

## 2019-07-26 DIAGNOSIS — Z79899 Other long term (current) drug therapy: Secondary | ICD-10-CM | POA: Diagnosis not present

## 2019-07-26 DIAGNOSIS — C9000 Multiple myeloma not having achieved remission: Secondary | ICD-10-CM

## 2019-07-26 DIAGNOSIS — Z5112 Encounter for antineoplastic immunotherapy: Secondary | ICD-10-CM | POA: Diagnosis not present

## 2019-07-26 LAB — COMPREHENSIVE METABOLIC PANEL
ALT: 13 U/L (ref 0–44)
AST: 16 U/L (ref 15–41)
Albumin: 4.1 g/dL (ref 3.5–5.0)
Alkaline Phosphatase: 160 U/L — ABNORMAL HIGH (ref 38–126)
Anion gap: 11 (ref 5–15)
BUN: 15 mg/dL (ref 8–23)
CO2: 28 mmol/L (ref 22–32)
Calcium: 9 mg/dL (ref 8.9–10.3)
Chloride: 97 mmol/L — ABNORMAL LOW (ref 98–111)
Creatinine, Ser: 0.55 mg/dL (ref 0.44–1.00)
GFR calc Af Amer: >60 mL/min (ref >60)
GFR calc non Af Amer: >60 mL/min (ref >60)
Glucose, Bld: 127 mg/dL — ABNORMAL HIGH (ref 70–99)
Potassium: 3.8 mmol/L (ref 3.5–5.1)
Sodium: 136 mmol/L (ref 135–145)
Total Bilirubin: 0.6 mg/dL (ref 0.3–1.2)
Total Protein: 6.8 g/dL (ref 6.5–8.1)

## 2019-07-26 LAB — CBC WITH DIFFERENTIAL/PLATELET
Abs Immature Granulocytes: 0.03 10*3/uL (ref 0.00–0.07)
Basophils Absolute: 0 10*3/uL (ref 0.0–0.1)
Basophils Relative: 1 %
Eosinophils Absolute: 0 10*3/uL (ref 0.0–0.5)
Eosinophils Relative: 1 %
HCT: 27.4 % — ABNORMAL LOW (ref 36.0–46.0)
Hemoglobin: 8.5 g/dL — ABNORMAL LOW (ref 12.0–15.0)
Immature Granulocytes: 1 %
Lymphocytes Relative: 16 %
Lymphs Abs: 0.4 10*3/uL — ABNORMAL LOW (ref 0.7–4.0)
MCH: 29.5 pg (ref 26.0–34.0)
MCHC: 31 g/dL (ref 30.0–36.0)
MCV: 95.1 fL (ref 80.0–100.0)
Monocytes Absolute: 0.1 10*3/uL (ref 0.1–1.0)
Monocytes Relative: 5 %
Neutro Abs: 2.2 10*3/uL (ref 1.7–7.7)
Neutrophils Relative %: 76 %
Platelets: 228 10*3/uL (ref 150–400)
RBC: 2.88 MIL/uL — ABNORMAL LOW (ref 3.87–5.11)
RDW: 17.3 % — ABNORMAL HIGH (ref 11.5–15.5)
WBC: 2.8 10*3/uL — ABNORMAL LOW (ref 4.0–10.5)
nRBC: 0 % (ref 0.0–0.2)

## 2019-07-26 MED ORDER — PROCHLORPERAZINE MALEATE 10 MG PO TABS
10.0000 mg | ORAL_TABLET | Freq: Once | ORAL | Status: AC
  Administered 2019-07-26: 10 mg via ORAL

## 2019-07-26 MED ORDER — BORTEZOMIB CHEMO SQ INJECTION 3.5 MG (2.5MG/ML)
1.3000 mg/m2 | Freq: Once | INTRAMUSCULAR | Status: AC
  Administered 2019-07-26: 2.25 mg via SUBCUTANEOUS
  Filled 2019-07-26: qty 0.9

## 2019-07-26 MED ORDER — PROCHLORPERAZINE MALEATE 10 MG PO TABS
ORAL_TABLET | ORAL | Status: AC
  Filled 2019-07-26: qty 1

## 2019-07-26 NOTE — Patient Instructions (Signed)
[place] at [facility] Discharge Instructions  You were seen today by Dr. Delton Coombes. He went over your recent results. Continue your Revlimid 2 weeks on and 1 week off. Continue your weekly injections. Dr. Delton Coombes will see you back in 3 weeks for labs and follow up.   Thank you for choosing [place] at [facility] to provide your oncology and hematology care.  To afford each patient quality time with our provider, please arrive at least 15 minutes before your scheduled appointment time.   If you have a lab appointment with the [facility] please come in thru the  Main Entrance and check in at the main information desk  You need to re-schedule your appointment should you arrive 10 or more minutes late.  We strive to give you quality time with our providers, and arriving late affects you and other patients whose appointments are after yours.  Also, if you no show three or more times for appointments you may be dismissed from the clinic at the providers discretion.     Again, thank you for choosing [facility].  Our hope is that these requests will decrease the amount of time that you wait before being seen by our physicians.       _____________________________________________________________  Should you have questions after your visit to [facility], please contact our office at [phone] between the hours of 8:00 a.m. and 4:30 p.m.  Voicemails left after 4:00 p.m. will not be returned until the following business day.  For prescription refill requests, have your pharmacy contact our office and allow 72 hours.    Cancer Center Support Programs:   > Cancer Support Group  2nd Tuesday of the month 1pm-2pm, Journey Room

## 2019-07-26 NOTE — Progress Notes (Signed)
Courtney Arnold, Courtney Arnold   CLINIC:  Medical Oncology/Hematology  PCP:  [name], [place] / [place] [address] [phone]   REASON FOR VISIT:  Follow-up for IgA kappa plasma cell myeloma.  CURRENT THERAPY: Revlimid  BRIEF ONCOLOGIC HISTORY:  Oncology History  Multiple myeloma not having achieved remission ([place])  06/07/2019 Initial Diagnosis   Multiple myeloma not having achieved remission ([place])   07/04/2019 -  Chemotherapy   The patient had bortezomib SQ (VELCADE) chemo injection 2.25 mg, 1.3 mg/m2 = 2.25 mg, Subcutaneous,  Once, 1 of 8 cycles Administration: 2.25 mg (07/04/2019), 2.25 mg (07/11/2019)  for chemotherapy treatment.      CANCER STAGING: Cancer Staging No matching staging information was found for the patient.  INTERVAL HISTORY:  Courtney Arnold 79 y.o. female returns for routine follow-up and consideration for next cycle of chemotherapy. Krishna was last seen on 07/11/2019.  Overall, she tells me she has been feeling pretty well. She notes that her back pain continues to get worse. She says that it is worse when she tries to get up and it is worse when walking. She has been taking her medicine as prescribed, but it has not been helping. She says that she is taking her medication every 6 hours more days than not. With pain medication, she says her pain levels go down from a 10 to a 6-8.   She started back on her Revlimid today.    REVIEW OF SYSTEMS:  Review of Systems  Constitutional: Positive for appetite change (moderate, decreased) and fatigue (mild). Negative for chills, diaphoresis and fever.  HENT:   Negative for mouth sores, sore throat and trouble swallowing.   Eyes: Negative for eye problems.  Respiratory: Negative for cough, shortness of breath and wheezing.   Cardiovascular: Negative for chest pain, leg swelling and palpitations.  Gastrointestinal: Negative for abdominal pain,  constipation, diarrhea, nausea and vomiting.  Genitourinary: Negative for bladder incontinence, dysuria and frequency.   Musculoskeletal: Positive for back pain. Negative for arthralgias and myalgias.  Skin: Negative for rash.  Neurological: Negative for dizziness, extremity weakness, headaches and numbness.  Hematological: Does not bruise/bleed easily.  Psychiatric/Behavioral: Negative for depression and sleep disturbance. The patient is not nervous/anxious.     PAST MEDICAL/SURGICAL HISTORY:  Past Medical History:  Diagnosis Date  . Atrial fibrillation ([place]) 2011   Postop, spontaneous conversion to normal sinus after one hour  . Compression fracture 07/24/09   T12; kyphoplasty  . History of echocardiogram 5/11   EF 65%  . Hypertension   . Thyroid disease   . Tobacco abuse    Past Surgical History:  Procedure Laterality Date  . BACK SURGERY    . BACK SURGERY  06/06/2015  . BREAST EXCISIONAL BIOPSY Left    50 years ago  benign  . CHOLECYSTECTOMY N/A 04/25/2015   Procedure: LAPAROSCOPIC CHOLECYSTECTOMY WITH INTRAOPERATIVE CHOLANGIOGRAM;  Surgeon: Mickeal Skinner, MD;  Location: [facility];  Service: General;  Laterality: N/A;  . COLONOSCOPY N/A 11/26/2015   Procedure: COLONOSCOPY;  Surgeon: Daneil Dolin, MD;  Location: [facility];  Service: Endoscopy;  Laterality: N/A;  7:30 am  . ERCP N/A 04/14/2015   Procedure: ENDOSCOPIC RETROGRADE CHOLANGIOPANCREATOGRAPHY (ERCP) Biliary Sphincterotomy, 10x7 stent placement Dilated bilary system just not well seen;  Surgeon: Rogene Houston, MD;  Location: [facility];  Service: Endoscopy;  Laterality: N/A;  . ERCP N/A 06/12/2015   Procedure: ENDOSCOPIC RETROGRADE CHOLANGIOPANCREATOGRAPHY (ERCP);  Surgeon: Rogene Houston, MD;  [facility];  Service: Endoscopy;  Laterality: N/A;  . ESOPHAGOGASTRODUODENOSCOPY N/A 06/12/2015   Procedure: DIAGNOSTIC ESOPHAGOGASTRODUODENOSCOPY (EGD);  Surgeon: Rogene Houston, MD;  [facility];   Service: Endoscopy;  Laterality: N/A;  . FEMUR IM NAIL Right 05/11/2019   Procedure: RETROGRADE INTRAMEDULLARY NAIL FEMORAL;  Surgeon: Shona Needles, MD;  Location: [facility];  Service: Orthopedics;  Laterality: Right;  . STENT REMOVAL  06/12/2015   Procedure: STENT REMOVAL ;  Surgeon: Rogene Houston, MD;  Location: [facility];  Service: Endoscopy;;    SOCIAL HISTORY:  Social History   Socioeconomic History  . Marital status: Widowed    Spouse name: Not on file  . Number of children: 2  . Years of education: Not on file  . Highest education level: Not on file  Occupational History  . Occupation: retired  Tobacco Use  . Smoking status: Former Smoker    Packs/day: 0.15    Years: 20.00    Pack years: 3.00    Quit date: 03/08/2013    Years since quitting: 6.3  . Smokeless tobacco: Never Used  Substance and Sexual Activity  . Alcohol use: No    Alcohol/week: 0.0 standard drinks  . Drug use: No  . Sexual activity: Not Currently  Other Topics Concern  . Not on file  Social History Narrative   Active in gardens and does yard work.   Social Determinants of Health   Financial Resource Strain: Low Risk   . Difficulty of Paying Living Expenses: Not hard at all  Food Insecurity: No Food Insecurity  . Worried About Charity fundraiser in the Last Year: Never true  . Ran Out of Food in the Last Year: Never true  Transportation Needs: No Transportation Needs  . Lack of Transportation (Medical): No  . Lack of Transportation (Non-Medical): No  Physical Activity: Inactive  . Days of Exercise per Week: 0 days  . Minutes of Exercise per Session: 0 min  Stress: No Stress Concern Present  . Feeling of Stress : Not at all  Social Connections:   . Frequency of Communication with Friends and Family:   . Frequency of Social Gatherings with Friends and Family:   . Attends Religious Services:   . Active Member of Clubs or Organizations:   . Attends Archivist Meetings:   [employer] Marital  Status:   Intimate Partner Violence: Not At Risk  . Fear of Current or Ex-Partner: No  . Emotionally Abused: No  . Physically Abused: No  . Sexually Abused: No    FAMILY HISTORY:  Family History  Problem Relation Age of Onset  . Heart attack Father   . Stroke Father   . Breast cancer Sister   . Thyroid disease Neg Hx   . Colon cancer Neg Hx     CURRENT MEDICATIONS:  Current Outpatient Medications  Medication Sig Dispense Refill  . Lactulose 20 GM/30ML SOLN Take 30 mLs (20 g total) by mouth every 3 (three) hours. Until you produce a bowel movement 450 mL 1  . acyclovir (ZOVIRAX) 400 MG tablet Take 1 tablet (400 mg total) by mouth 2 (two) times daily. 60 tablet 3  . ALPRAZolam (XANAX) 0.5 MG tablet Take 1 tablet (0.5 mg total) by mouth at bedtime. (Patient taking differently: Take 0.5 mg by mouth at bedtime as needed. ) 15 tablet 0  . aspirin EC 81 MG tablet Take 81 mg by mouth  daily.    . bortezomib IV (VELCADE) 3.5 MG injection Inject 1.3 mg/m2 into the vein once a week. Day 1, 8, 15 every 21 days    . carboxymethylcellulose (REFRESH PLUS) 0.5 % SOLN Place 1 drop into both eyes in the morning, at noon, in the evening, and at bedtime.    . Cholecalciferol (VITAMIN D) 125 MCG (5000 UT) CAPS Take 5,000 Units by mouth daily. 90 capsule 1  . cyclobenzaprine (FLEXERIL) 5 MG tablet Take 1 tablet (5 mg total) by mouth 3 (three) times daily as needed for muscle spasms. (Patient not taking: Reported on 06/21/2019) 30 tablet 0  . dexamethasone (DECADRON) 4 MG tablet Take 5 tablets (20 mg) on days 1, 8, and 15 of chemo. Repeat every 21 days. 30 tablet 3  . HYDROcodone-acetaminophen (NORCO/VICODIN) 5-325 MG tablet TAKE (1) TABLET BY MOUTH EVERY (6) HOURS AS NEEDED. 60 tablet 0  . lenalidomide (REVLIMID) 15 MG capsule Take one capsule daily on days 1-14 every 21 days. 14 capsule 0  . levothyroxine (SYNTHROID) 100 MCG tablet Take 1 tablet (100 mcg total) by mouth daily. 30 tablet 0  . Lidocaine  HCl-Benzyl Alcohol (SALONPAS LIDOCAINE PLUS) 4-10 % CREA Apply 1 patch topically daily. 85 g 2  . NON FORMULARY Diet -Regular    . prochlorperazine (COMPAZINE) 10 MG tablet Take 1 tablet (10 mg total) by mouth every 6 (six) hours as needed (Nausea or vomiting). (Patient not taking: Reported on 07/04/2019) 30 tablet 1   No current facility-administered medications for this visit.    ALLERGIES:  No Known Allergies  PHYSICAL EXAM:  Performance status (ECOG): 1 - Symptomatic but completely ambulatory  Vitals:   07/26/19 1009  BP: (!) 109/43  Pulse: 76  Resp: 20  Temp: (!) 97.1 F (36.2 C)  SpO2: 97%   Wt Readings from Last 3 Encounters:  07/11/19 130 lb 12.8 oz (59.3 kg)  07/04/19 130 lb 9.6 oz (59.2 kg)  06/21/19 134 lb 4.8 oz (60.9 kg)   Physical Exam Constitutional:      General: She is not in acute distress.    Appearance: Normal appearance. She is normal weight. She is not ill-appearing.  HENT:     Mouth/Throat:     Mouth: Mucous membranes are moist.     Pharynx: No oropharyngeal exudate or posterior oropharyngeal erythema.  Eyes:     Extraocular Movements: Extraocular movements intact.     Pupils: Pupils are equal, round, and reactive to light.  Cardiovascular:     Rate and Rhythm: Normal rate and regular rhythm.     Pulses: Normal pulses.     Heart sounds: Normal heart sounds. No murmur. No friction rub. No gallop.   Pulmonary:     Effort: Pulmonary effort is normal.     Breath sounds: Normal breath sounds. No wheezing, rhonchi or rales.  Abdominal:     Palpations: There is no mass.     Tenderness: There is no abdominal tenderness. There is no guarding.  Musculoskeletal:        General: No swelling or tenderness.     Right lower leg: No edema.     Left lower leg: No edema.  Skin:    Findings: No bruising or erythema.  Neurological:     Mental Status: She is alert and oriented to person, place, and time.     Sensory: No sensory deficit.  Psychiatric:         Mood and Affect: Mood normal.  Behavior: Behavior normal.        Thought Content: Thought content normal.        Judgment: Judgment normal.     LABORATORY DATA:  I have reviewed the labs as listed.  CBC Latest Ref Rng & Units 07/26/2019 07/11/2019 07/04/2019  WBC 4.0 - 10.5 K/uL 2.8(L) 4.7 3.1(L)  Hemoglobin 12.0 - 15.0 g/dL 8.5(L) 8.7(L) 8.9(L)  Hematocrit 36.0 - 46.0 % 27.4(L) 28.1(L) 28.5(L)  Platelets 150 - 400 K/uL 228 159 154   CMP Latest Ref Rng & Units 07/26/2019 07/11/2019 07/04/2019  Glucose 70 - 99 mg/dL 127(H) 147(H) 140(H)  BUN 8 - 23 mg/dL 15 25(H) 19  Creatinine 0.44 - 1.00 mg/dL 0.55 0.67 0.68  Sodium 135 - 145 mmol/L 136 131(L) 134(L)  Potassium 3.5 - 5.1 mmol/L 3.8 4.0 4.0  Chloride 98 - 111 mmol/L 97(L) 98 95(L)  CO2 22 - 32 mmol/L _0 Calcium 8.9 - 10.3 mg/dL 9.0 8.7(L) 9.8  Total Protein 6.5 - 8.1 g/dL 6.8 7.2 7.3  Total Bilirubin 0.3 - 1.2 mg/dL 0.6 0.8 0.5  Alkaline Phos 38 - 126 U/L 160(H) 113 108  AST 15 - 41 U/L 16 13(L) 15  ALT 0 - 44 U/L _1 DIAGNOSTIC IMAGING:  I have independently reviewed the scans and discussed with the patient.  ASSESSMENT & PLAN:  Multiple myeloma not having achieved remission ([facility]) Assessment: -IgA kappa plasma cell myeloma diagnosed on right sacral bone biopsy on 05/17/2019.  SPEP did not show M spike.  Immunofixation shows IgA kappa.  Kappa light chains 42.8, lambda light chains at 9.9, ratio 4.32.  Beta-2 microglobulin 3.7. -PET scan on 06/04/2019 showed expansile lytic lesion involving the right sacral wing.  Lytic lesion involving T10 vertebral body.  Left seventh rib lesion.  Increased uptake in a pathological fracture involving distal aspect of the right femur. 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 -Cycle 1 of RVD started on 07/04/2019.  Revlimid 15 mg 2 weeks on/1 week off.   Plan: 1.IgA kappa plasma cell myeloma: -She missed day 15 of  Velcade.  She is taking 10 mg of dexamethasone when she is off of Revlimid.  Otherwise she takes 20 mg weekly.  I reviewed her labs.  She will proceed with her treatment today.  I will reevaluate in 3 weeks with repeat myeloma labs.  To start back Revlimid tonight.  2.  Back pain: -She is accompanied by her daughter today.  Her daughter reports that back pain is not improved. -She is taking hydrocodone 5/325 3-4 times a day.  Recent MRI of the lumbar spine showed L4 compression fracture.  Pain levels improve from 10 out of 10 to 6 out of 10. -She will follow up with her neurosurgeon to see if any intervention would be helpful.  3.  Normocytic anemia: -Hemoglobin today is 8.5.  Prior ferritin was 64 and percent saturation 9.  Will consider parenteral iron therapy if there is any decline.  4.  ID prophylaxis: -Continue acyclovir twice daily.  Aspirin 81 mg daily.    Orders placed this encounter:  Orders Placed This Encounter  Procedures  . CBC with Differential/Platelet  . Comprehensive metabolic panel  . Magnesium  . Lactate dehydrogenase  . Protein electrophoresis, serum  . Kappa/lambda light chains  . Lactate dehydrogenase       Derek Jack, MD, 07/27/19 3:39 PM  Deneise Lever  [place], am acting as a Education administrator for Dr. Sanda Linger.   I, Derek Jack MD, have reviewed the above documentation for accuracy and completeness, and I agree with the above.

## 2019-07-26 NOTE — Patient Instructions (Signed)
[facility] Discharge Instructions for Patients Receiving Chemotherapy   Beginning January 23rd 2017 lab work for the Cancer Center will be done in the  Main lab at [facility] on 1st floor. If you have a lab appointment with the Cancer Center please come in thru the  Main Entrance and check in at the main information desk   Today you received the following chemotherapy agents Velcade injection. Follow-up as scheduled. Call clinic for any questions or concerns  To help prevent nausea and vomiting after your treatment, we encourage you to take your nausea medication   If you develop nausea and vomiting, or diarrhea that is not controlled by your medication, call the clinic.  The clinic phone number is [phone]. Office hours are Monday-Friday 8:30am-5:00pm.  BELOW ARE SYMPTOMS THAT SHOULD BE REPORTED IMMEDIATELY:  *FEVER GREATER THAN 101.0 F  *CHILLS WITH OR WITHOUT FEVER  NAUSEA AND VOMITING THAT IS NOT CONTROLLED WITH YOUR NAUSEA MEDICATION  *UNUSUAL SHORTNESS OF BREATH  *UNUSUAL BRUISING OR BLEEDING  TENDERNESS IN MOUTH AND THROAT WITH OR WITHOUT PRESENCE OF ULCERS  *URINARY PROBLEMS  *BOWEL PROBLEMS  UNUSUAL RASH Items with * indicate a potential emergency and should be followed up as soon as possible. If you have an emergency after office hours please contact your primary care physician or go to the nearest emergency department.  Please call the clinic during office hours if you have any questions or concerns.   You may also contact the Patient Navigator at [phone] should you have any questions or need assistance in obtaining follow up care.      Resources For Cancer Patients and their Caregivers ? American Cancer Society: Can assist with transportation, wigs, general needs, runs Look Good Feel Better.        1-888-227-6333 ? Cancer Care: Provides financial assistance, online support groups, medication/co-pay assistance.   1-800-813-HOPE (4673) ? [facility] Assists [place] cancer patients and their families through emotional , educational and financial support.  336-427-4357 ? [place] DSS Where to apply for food stamps, Medicaid and utility assistance. 336-342-1394 ? [facility]: Transportation to medical appointments. 336-347-2287 ? Social Security Administration: May apply for disability if have a Stage IV cancer. 336-342-7796 1-800-772-1213 ? [place] Aging, Disability and Transit Services: Assists with nutrition, care and transit needs. 336-349-2343         

## 2019-07-26 NOTE — Progress Notes (Signed)
[id number] Labs reviewed with and pt seen by Dr. Delton Coombes and pt approved for Velcade injection today per MD                                  Courtney Arnold tolerated Velcade injection well without complaints or incident. Pt discharged via wheelchair in satisfactory condition accompanied by caregiver

## 2019-07-27 NOTE — Assessment & Plan Note (Addendum)
Assessment: -IgA kappa plasma cell myeloma diagnosed on right sacral bone biopsy on 05/17/2019.  SPEP did not show M spike.  Immunofixation shows IgA kappa.  Kappa light chains 42.8, lambda light chains at 9.9, ratio 4.32.  Beta-2 microglobulin 3.7. -PET scan on 06/04/2019 showed expansile lytic lesion involving the right sacral wing.  Lytic lesion involving T10 vertebral body.  Left seventh rib lesion.  Increased uptake in a pathological fracture involving distal aspect of the right femur. 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 -Cycle 1 of RVD started on 07/04/2019.  Revlimid 15 mg 2 weeks on/1 week off.   Plan: 1.IgA kappa plasma cell myeloma: -She missed day 15 of Velcade.  She is taking 10 mg of dexamethasone when she is off of Revlimid.  Otherwise she takes 20 mg weekly.  I reviewed her labs.  She will proceed with her treatment today.  I will reevaluate in 3 weeks with repeat myeloma labs.  To start back Revlimid tonight.  2.  Back pain: -She is accompanied by her daughter today.  Her daughter reports that back pain is not improved. -She is taking hydrocodone 5/325 3-4 times a day.  Recent MRI of the lumbar spine showed L4 compression fracture.  Pain levels improve from 10 out of 10 to 6 out of 10. -She will follow up with her neurosurgeon to see if any intervention would be helpful.  3.  Normocytic anemia: -Hemoglobin today is 8.5.  Prior ferritin was 64 and percent saturation 9.  Will consider parenteral iron therapy if there is any decline.  4.  ID prophylaxis: -Continue acyclovir twice daily.  Aspirin 81 mg daily.

## 2019-08-01 DIAGNOSIS — J9601 Acute respiratory failure with hypoxia: Secondary | ICD-10-CM | POA: Diagnosis not present

## 2019-08-01 DIAGNOSIS — M545 Low back pain: Secondary | ICD-10-CM | POA: Diagnosis not present

## 2019-08-01 DIAGNOSIS — I4891 Unspecified atrial fibrillation: Secondary | ICD-10-CM | POA: Diagnosis not present

## 2019-08-01 DIAGNOSIS — G8929 Other chronic pain: Secondary | ICD-10-CM | POA: Diagnosis not present

## 2019-08-02 ENCOUNTER — Inpatient Hospital Stay (HOSPITAL_COMMUNITY): Payer: Medicare Other

## 2019-08-02 ENCOUNTER — Ambulatory Visit (HOSPITAL_COMMUNITY): Payer: Medicare Other

## 2019-08-02 ENCOUNTER — Other Ambulatory Visit: Payer: Self-pay

## 2019-08-02 ENCOUNTER — Encounter (HOSPITAL_COMMUNITY): Payer: Self-pay

## 2019-08-02 VITALS — BP 115/41 | HR 81 | Temp 98.2°F | Resp 18 | Wt 130.0 lb

## 2019-08-02 DIAGNOSIS — C9000 Multiple myeloma not having achieved remission: Secondary | ICD-10-CM

## 2019-08-02 DIAGNOSIS — Z5112 Encounter for antineoplastic immunotherapy: Secondary | ICD-10-CM | POA: Diagnosis not present

## 2019-08-02 DIAGNOSIS — Z79899 Other long term (current) drug therapy: Secondary | ICD-10-CM | POA: Diagnosis not present

## 2019-08-02 LAB — CBC WITH DIFFERENTIAL/PLATELET
Abs Immature Granulocytes: 0.04 10*3/uL (ref 0.00–0.07)
Basophils Absolute: 0 10*3/uL (ref 0.0–0.1)
Basophils Relative: 0 %
Eosinophils Absolute: 0.1 10*3/uL (ref 0.0–0.5)
Eosinophils Relative: 2 %
HCT: 28.3 % — ABNORMAL LOW (ref 36.0–46.0)
Hemoglobin: 8.7 g/dL — ABNORMAL LOW (ref 12.0–15.0)
Immature Granulocytes: 1 %
Lymphocytes Relative: 12 %
Lymphs Abs: 0.4 10*3/uL — ABNORMAL LOW (ref 0.7–4.0)
MCH: 29.5 pg (ref 26.0–34.0)
MCHC: 30.7 g/dL (ref 30.0–36.0)
MCV: 95.9 fL (ref 80.0–100.0)
Monocytes Absolute: 0.1 10*3/uL (ref 0.1–1.0)
Monocytes Relative: 2 %
Neutro Abs: 3.1 10*3/uL (ref 1.7–7.7)
Neutrophils Relative %: 83 %
Platelets: 139 10*3/uL — ABNORMAL LOW (ref 150–400)
RBC: 2.95 MIL/uL — ABNORMAL LOW (ref 3.87–5.11)
RDW: 17.6 % — ABNORMAL HIGH (ref 11.5–15.5)
WBC: 3.7 10*3/uL — ABNORMAL LOW (ref 4.0–10.5)
nRBC: 0 % (ref 0.0–0.2)

## 2019-08-02 LAB — COMPREHENSIVE METABOLIC PANEL
ALT: 13 U/L (ref 0–44)
AST: 16 U/L (ref 15–41)
Albumin: 3.8 g/dL (ref 3.5–5.0)
Alkaline Phosphatase: 157 U/L — ABNORMAL HIGH (ref 38–126)
Anion gap: 10 (ref 5–15)
BUN: 13 mg/dL (ref 8–23)
CO2: 28 mmol/L (ref 22–32)
Calcium: 8.8 mg/dL — ABNORMAL LOW (ref 8.9–10.3)
Chloride: 97 mmol/L — ABNORMAL LOW (ref 98–111)
Creatinine, Ser: 0.56 mg/dL (ref 0.44–1.00)
GFR calc Af Amer: >60 mL/min (ref >60)
GFR calc non Af Amer: >60 mL/min (ref >60)
Glucose, Bld: 114 mg/dL — ABNORMAL HIGH (ref 70–99)
Potassium: 3.7 mmol/L (ref 3.5–5.1)
Sodium: 135 mmol/L (ref 135–145)
Total Bilirubin: 0.6 mg/dL (ref 0.3–1.2)
Total Protein: 6.5 g/dL (ref 6.5–8.1)

## 2019-08-02 MED ORDER — BORTEZOMIB CHEMO SQ INJECTION 3.5 MG (2.5MG/ML)
1.3000 mg/m2 | Freq: Once | INTRAMUSCULAR | Status: AC
  Administered 2019-08-02: 2.25 mg via SUBCUTANEOUS
  Filled 2019-08-02: qty 0.9

## 2019-08-02 MED ORDER — PROCHLORPERAZINE MALEATE 10 MG PO TABS
10.0000 mg | ORAL_TABLET | Freq: Once | ORAL | Status: AC
  Administered 2019-08-02: 10 mg via ORAL
  Filled 2019-08-02: qty 1

## 2019-08-02 NOTE — Progress Notes (Signed)
No new complaints today per patient. Will proceed as planned. Labs meet parameters for treatment.    Treatment given per orders. Patient tolerated it well without problems. Vitals stable and discharged home from clinic via wheelchair with Daughter. Follow up as scheduled.

## 2019-08-07 NOTE — Progress Notes (Signed)

## 2019-08-09 ENCOUNTER — Other Ambulatory Visit: Payer: Self-pay

## 2019-08-09 ENCOUNTER — Inpatient Hospital Stay (HOSPITAL_COMMUNITY): Payer: Medicare Other

## 2019-08-09 ENCOUNTER — Encounter (HOSPITAL_COMMUNITY): Payer: Self-pay

## 2019-08-09 ENCOUNTER — Other Ambulatory Visit (HOSPITAL_COMMUNITY): Payer: Self-pay | Admitting: *Deleted

## 2019-08-09 ENCOUNTER — Inpatient Hospital Stay (HOSPITAL_COMMUNITY): Payer: Medicare Other | Attending: Hematology

## 2019-08-09 VITALS — BP 101/56 | HR 73 | Temp 97.3°F | Resp 18 | Wt 133.8 lb

## 2019-08-09 DIAGNOSIS — C9000 Multiple myeloma not having achieved remission: Secondary | ICD-10-CM

## 2019-08-09 DIAGNOSIS — Z5112 Encounter for antineoplastic immunotherapy: Secondary | ICD-10-CM | POA: Insufficient documentation

## 2019-08-09 LAB — COMPREHENSIVE METABOLIC PANEL
ALT: 29 U/L (ref 0–44)
AST: 31 U/L (ref 15–41)
Albumin: 3.9 g/dL (ref 3.5–5.0)
Alkaline Phosphatase: 156 U/L — ABNORMAL HIGH (ref 38–126)
Anion gap: 11 (ref 5–15)
BUN: 13 mg/dL (ref 8–23)
CO2: 26 mmol/L (ref 22–32)
Calcium: 8.7 mg/dL — ABNORMAL LOW (ref 8.9–10.3)
Chloride: 96 mmol/L — ABNORMAL LOW (ref 98–111)
Creatinine, Ser: 0.62 mg/dL (ref 0.44–1.00)
GFR calc Af Amer: >60 mL/min (ref >60)
GFR calc non Af Amer: >60 mL/min (ref >60)
Glucose, Bld: 118 mg/dL — ABNORMAL HIGH (ref 70–99)
Potassium: 3.7 mmol/L (ref 3.5–5.1)
Sodium: 133 mmol/L — ABNORMAL LOW (ref 135–145)
Total Bilirubin: 0.6 mg/dL (ref 0.3–1.2)
Total Protein: 6.7 g/dL (ref 6.5–8.1)

## 2019-08-09 LAB — CBC WITH DIFFERENTIAL/PLATELET
Abs Immature Granulocytes: 0.02 10*3/uL (ref 0.00–0.07)
Basophils Absolute: 0 10*3/uL (ref 0.0–0.1)
Basophils Relative: 1 %
Eosinophils Absolute: 0.1 10*3/uL (ref 0.0–0.5)
Eosinophils Relative: 1 %
HCT: 28.4 % — ABNORMAL LOW (ref 36.0–46.0)
Hemoglobin: 8.8 g/dL — ABNORMAL LOW (ref 12.0–15.0)
Immature Granulocytes: 1 %
Lymphocytes Relative: 9 %
Lymphs Abs: 0.4 10*3/uL — ABNORMAL LOW (ref 0.7–4.0)
MCH: 29.3 pg (ref 26.0–34.0)
MCHC: 31 g/dL (ref 30.0–36.0)
MCV: 94.7 fL (ref 80.0–100.0)
Monocytes Absolute: 0.1 10*3/uL (ref 0.1–1.0)
Monocytes Relative: 3 %
Neutro Abs: 3.5 10*3/uL (ref 1.7–7.7)
Neutrophils Relative %: 85 %
Platelets: 144 10*3/uL — ABNORMAL LOW (ref 150–400)
RBC: 3 MIL/uL — ABNORMAL LOW (ref 3.87–5.11)
RDW: 17.2 % — ABNORMAL HIGH (ref 11.5–15.5)
WBC: 4.1 10*3/uL (ref 4.0–10.5)
nRBC: 0 % (ref 0.0–0.2)

## 2019-08-09 LAB — LACTATE DEHYDROGENASE: LDH: 142 U/L (ref 98–192)

## 2019-08-09 MED ORDER — BORTEZOMIB CHEMO SQ INJECTION 3.5 MG (2.5MG/ML)
1.3000 mg/m2 | Freq: Once | INTRAMUSCULAR | Status: AC
  Administered 2019-08-09: 2.25 mg via SUBCUTANEOUS
  Filled 2019-08-09: qty 0.9

## 2019-08-09 MED ORDER — CYANOCOBALAMIN 1000 MCG/ML IJ SOLN
1000.0000 ug | Freq: Once | INTRAMUSCULAR | Status: AC
  Administered 2019-08-09: 1000 ug via INTRAMUSCULAR

## 2019-08-09 MED ORDER — CYANOCOBALAMIN 1000 MCG/ML IJ SOLN
INTRAMUSCULAR | Status: AC
  Filled 2019-08-09: qty 1

## 2019-08-09 MED ORDER — LENALIDOMIDE 15 MG PO CAPS: ORAL_CAPSULE | ORAL | 0 refills | Status: DC

## 2019-08-09 MED ORDER — PROCHLORPERAZINE MALEATE 10 MG PO TABS
10.0000 mg | ORAL_TABLET | Freq: Once | ORAL | Status: AC
  Administered 2019-08-09: 10 mg via ORAL
  Filled 2019-08-09: qty 1

## 2019-08-10 LAB — KAPPA/LAMBDA LIGHT CHAINS
Kappa free light chain: 31.9 mg/L — ABNORMAL HIGH (ref 3.3–19.4)
Kappa, lambda light chain ratio: 2.06 — ABNORMAL HIGH (ref 0.26–1.65)
Lambda free light chains: 15.5 mg/L (ref 5.7–26.3)

## 2019-08-10 LAB — PROTEIN ELECTROPHORESIS, SERUM
A/G Ratio: 1.3 (ref 0.7–1.7)
Albumin ELP: 3.5 g/dL (ref 2.9–4.4)
Alpha-1-Globulin: 0.3 g/dL (ref 0.0–0.4)
Alpha-2-Globulin: 0.8 g/dL (ref 0.4–1.0)
Beta Globulin: 1.1 g/dL (ref 0.7–1.3)
Gamma Globulin: 0.5 g/dL (ref 0.4–1.8)
Globulin, Total: 2.7 g/dL (ref 2.2–3.9)
Total Protein ELP: 6.2 g/dL (ref 6.0–8.5)

## 2019-08-16 ENCOUNTER — Inpatient Hospital Stay (HOSPITAL_COMMUNITY): Payer: Medicare Other

## 2019-08-16 ENCOUNTER — Inpatient Hospital Stay (HOSPITAL_BASED_OUTPATIENT_CLINIC_OR_DEPARTMENT_OTHER): Payer: Medicare Other | Admitting: Hematology

## 2019-08-16 ENCOUNTER — Other Ambulatory Visit: Payer: Self-pay

## 2019-08-16 VITALS — BP 117/62 | HR 67 | Temp 96.9°F | Resp 17 | Wt 133.1 lb

## 2019-08-16 DIAGNOSIS — Z5112 Encounter for antineoplastic immunotherapy: Secondary | ICD-10-CM | POA: Diagnosis not present

## 2019-08-16 DIAGNOSIS — C9000 Multiple myeloma not having achieved remission: Secondary | ICD-10-CM

## 2019-08-16 LAB — CBC WITH DIFFERENTIAL/PLATELET
Abs Immature Granulocytes: 0.01 10*3/uL (ref 0.00–0.07)
Basophils Absolute: 0 10*3/uL (ref 0.0–0.1)
Basophils Relative: 0 %
Eosinophils Absolute: 0 10*3/uL (ref 0.0–0.5)
Eosinophils Relative: 0 %
HCT: 30.2 % — ABNORMAL LOW (ref 36.0–46.0)
Hemoglobin: 9.4 g/dL — ABNORMAL LOW (ref 12.0–15.0)
Immature Granulocytes: 0 %
Lymphocytes Relative: 13 %
Lymphs Abs: 0.3 10*3/uL — ABNORMAL LOW (ref 0.7–4.0)
MCH: 29.6 pg (ref 26.0–34.0)
MCHC: 31.1 g/dL (ref 30.0–36.0)
MCV: 95 fL (ref 80.0–100.0)
Monocytes Absolute: 0 10*3/uL — ABNORMAL LOW (ref 0.1–1.0)
Monocytes Relative: 1 %
Neutro Abs: 1.9 10*3/uL (ref 1.7–7.7)
Neutrophils Relative %: 86 %
Platelets: 261 10*3/uL (ref 150–400)
RBC: 3.18 MIL/uL — ABNORMAL LOW (ref 3.87–5.11)
RDW: 16.8 % — ABNORMAL HIGH (ref 11.5–15.5)
WBC: 2.3 10*3/uL — ABNORMAL LOW (ref 4.0–10.5)
nRBC: 0 % (ref 0.0–0.2)

## 2019-08-16 LAB — COMPREHENSIVE METABOLIC PANEL
ALT: 14 U/L (ref 0–44)
AST: 12 U/L — ABNORMAL LOW (ref 15–41)
Albumin: 3.9 g/dL (ref 3.5–5.0)
Alkaline Phosphatase: 161 U/L — ABNORMAL HIGH (ref 38–126)
Anion gap: 10 (ref 5–15)
BUN: 16 mg/dL (ref 8–23)
CO2: 25 mmol/L (ref 22–32)
Calcium: 8.9 mg/dL (ref 8.9–10.3)
Chloride: 98 mmol/L (ref 98–111)
Creatinine, Ser: 0.57 mg/dL (ref 0.44–1.00)
GFR calc Af Amer: >60 mL/min (ref >60)
GFR calc non Af Amer: >60 mL/min (ref >60)
Glucose, Bld: 153 mg/dL — ABNORMAL HIGH (ref 70–99)
Potassium: 4 mmol/L (ref 3.5–5.1)
Sodium: 133 mmol/L — ABNORMAL LOW (ref 135–145)
Total Bilirubin: 0.3 mg/dL (ref 0.3–1.2)
Total Protein: 6.9 g/dL (ref 6.5–8.1)

## 2019-08-16 MED ORDER — BORTEZOMIB CHEMO SQ INJECTION 3.5 MG (2.5MG/ML)
1.3000 mg/m2 | Freq: Once | INTRAMUSCULAR | Status: AC
  Administered 2019-08-16: 2.25 mg via SUBCUTANEOUS
  Filled 2019-08-16: qty 0.9

## 2019-08-16 MED ORDER — PROCHLORPERAZINE MALEATE 10 MG PO TABS
10.0000 mg | ORAL_TABLET | Freq: Once | ORAL | Status: AC
  Administered 2019-08-16: 10 mg via ORAL

## 2019-08-16 MED ORDER — PROCHLORPERAZINE MALEATE 10 MG PO TABS
ORAL_TABLET | ORAL | Status: AC
  Filled 2019-08-16: qty 1

## 2019-08-16 NOTE — Progress Notes (Unsigned)
Labs reviewed with MD at office visit today. Will proceed with velcade injection today per MD.  Treatment given per orders. Patient tolerated it well without problems. Vitals stable and discharged home from clinic via wheelchair. Follow up as scheduled.

## 2019-08-16 NOTE — Patient Instructions (Signed)
[facility] Discharge Instructions  You were seen today by Dr. Katragadda. He went over your recent results. Dr. Katragadda will see you back in 4 weeks for labs and follow up.   Thank you for choosing [facility] at [facility] to provide your oncology and hematology care.  To afford each patient quality time with our provider, please arrive at least 15 minutes before your scheduled appointment time.   If you have a lab appointment with the Cancer Center please come in thru the Main Entrance and check in at the main information desk  You need to re-schedule your appointment should you arrive 10 or more minutes late.  We strive to give you quality time with our providers, and arriving late affects you and other patients whose appointments are after yours.  Also, if you no show three or more times for appointments you may be dismissed from the clinic at the providers discretion.     Again, thank you for choosing [facility].  Our hope is that these requests will decrease the amount of time that you wait before being seen by our physicians.       _____________________________________________________________  Should you have questions after your visit to [facility], please contact our office at [phone] between the hours of 8:00 a.m. and 4:30 p.m.  Voicemails left after 4:00 p.m. will not be returned until the following business day.  For prescription refill requests, have your pharmacy contact our office and allow 72 hours.    Cancer Center Support Programs:   > Cancer Support Group  2nd Tuesday of the month 1pm-2pm, Journey Room    

## 2019-08-16 NOTE — Progress Notes (Signed)
Courtney Arnold, [place] [address]   CLINIC:  Medical Oncology/Hematology  PCP:  [name], [place] / [place] [address] [phone]   REASON FOR VISIT:  Follow-up for IgA kappa plasma cell myeloma  CURRENT THERAPY: Revlimid  BRIEF ONCOLOGIC HISTORY:  Oncology History  Multiple myeloma not having achieved remission ([place])  06/07/2019 Initial Diagnosis   Multiple myeloma not having achieved remission ([place])   07/04/2019 -  Chemotherapy   The patient had bortezomib SQ (VELCADE) chemo injection 2.25 mg, 1.3 mg/m2 = 2.25 mg, Subcutaneous,  Once, 2 of 8 cycles Administration: 2.25 mg (07/04/2019), 2.25 mg (07/11/2019), 2.25 mg (07/26/2019), 2.25 mg (08/02/2019), 2.25 mg (08/09/2019)  for chemotherapy treatment.      CANCER STAGING: Cancer Staging No matching staging information was found for the patient.  INTERVAL HISTORY:  Ms. MARIANNY Arnold, a 79 y.o. female, returns for routine follow-up and consideration for next cycle of chemotherapy. Jamyla was last seen on 07/26/2019.  Due for day #15 of cycle #2 of Velcade today.   Overall, she tells me she has been feeling pretty well. She is tolerating the treatments well. She denies having any numbness or tingling. She took her dexamethasone this morning. She denies having diarrhea or constipation. The last time she needed a dose of Norco was last week.  Overall, she feels ready for next cycle of chemo today.    REVIEW OF SYSTEMS:  Review of Systems  Constitutional: Negative for appetite change and fatigue.  Cardiovascular: Negative for leg swelling.  Gastrointestinal: Negative for constipation and diarrhea.  Neurological: Negative for numbness.  All other systems reviewed and are negative.   PAST MEDICAL/SURGICAL HISTORY:  Past Medical History:  Diagnosis Date  . Atrial fibrillation ([place]) 2011   Postop, spontaneous conversion to normal sinus after one hour  . Compression  fracture 07/24/09   T12; kyphoplasty  . History of echocardiogram 5/11   EF 65%  . Hypertension   . Thyroid disease   . Tobacco abuse    Past Surgical History:  Procedure Laterality Date  . BACK SURGERY    . BACK SURGERY  06/06/2015  . BREAST EXCISIONAL BIOPSY Left    50 years ago  benign  . CHOLECYSTECTOMY N/A 04/25/2015   Procedure: LAPAROSCOPIC CHOLECYSTECTOMY WITH INTRAOPERATIVE CHOLANGIOGRAM;  Surgeon: Mickeal Skinner, MD;  Location: [facility];  Service: General;  Laterality: N/A;  . COLONOSCOPY N/A 11/26/2015   Procedure: COLONOSCOPY;  Surgeon: Daneil Dolin, MD;  Location: [facility];  Service: Endoscopy;  Laterality: N/A;  7:30 am  . ERCP N/A 04/14/2015   Procedure: ENDOSCOPIC RETROGRADE CHOLANGIOPANCREATOGRAPHY (ERCP) Biliary Sphincterotomy, 10x7 stent placement Dilated bilary system just not well seen;  Surgeon: Rogene Houston, MD;  Location: [facility];  Service: Endoscopy;  Laterality: N/A;  . ERCP N/A 06/12/2015   Procedure: ENDOSCOPIC RETROGRADE CHOLANGIOPANCREATOGRAPHY (ERCP);  Surgeon: Rogene Houston, MD;  Location[facility];  Service: Endoscopy;  Laterality: N/A;  . ESOPHAGOGASTRODUODENOSCOPY N/A 06/12/2015   Procedure: DIAGNOSTIC ESOPHAGOGASTRODUODENOSCOPY (EGD);  Surgeon: Rogene Houston, MD;  [facility];  Service: Endoscopy;  Laterality: N/A;  . FEMUR IM NAIL Right 05/11/2019   Procedure: RETROGRADE INTRAMEDULLARY NAIL FEMORAL;  Surgeon: Shona Needles, MD;  Location: [facility];  Service: Orthopedics;  Laterality: Right;  . STENT REMOVAL  06/12/2015   Procedure: STENT REMOVAL ;  Surgeon: Rogene Houston, MD;  Location: [facility];  Service: Endoscopy;;  SOCIAL HISTORY:  Social History   Socioeconomic History  . Marital status: Widowed    Spouse name: Not on file  . Number of children: 2  . Years of education: Not on file  . Highest education level: Not on file  Occupational History  . Occupation: retired  Tobacco Use  . Smoking status: Former  Smoker    Packs/day: 0.15    Years: 20.00    Pack years: 3.00    Quit date: 03/08/2013    Years since quitting: 6.4  . Smokeless tobacco: Never Used  Vaping Use  . Vaping Use: Never used  Substance and Sexual Activity  . Alcohol use: No    Alcohol/week: 0.0 standard drinks  . Drug use: No  . Sexual activity: Not Currently  Other Topics Concern  . Not on file  Social History Narrative   Active in gardens and does yard work.   Social Determinants of Health   Financial Resource Strain: Low Risk   . Difficulty of Paying Living Expenses: Not hard at all  Food Insecurity: No Food Insecurity  . Worried About Charity fundraiser in the Last Year: Never true  . Ran Out of Food in the Last Year: Never true  Transportation Needs: No Transportation Needs  . Lack of Transportation (Medical): No  . Lack of Transportation (Non-Medical): No  Physical Activity: Inactive  . Days of Exercise per Week: 0 days  . Minutes of Exercise per Session: 0 min  Stress: No Stress Concern Present  . Feeling of Stress : Not at all  Social Connections:   . Frequency of Communication with Friends and Family:   . Frequency of Social Gatherings with Friends and Family:   . Attends Religious Services:   . Active Member of Clubs or Organizations:   . Attends Archivist Meetings:   Marland Kitchen Marital Status:   Intimate Partner Violence: Not At Risk  . Fear of Current or Ex-Partner: No  . Emotionally Abused: No  . Physically Abused: No  . Sexually Abused: No    FAMILY HISTORY:  Family History  Problem Relation Age of Onset  . Heart attack Father   . Stroke Father   . Breast cancer Sister   . Thyroid disease Neg Hx   . Colon cancer Neg Hx     CURRENT MEDICATIONS:  Current Outpatient Medications  Medication Sig Dispense Refill  . acyclovir (ZOVIRAX) 400 MG tablet Take 1 tablet (400 mg total) by mouth 2 (two) times daily. 60 tablet 3  . ALPRAZolam (XANAX) 0.5 MG tablet Take 1 tablet (0.5 mg total)  by mouth at bedtime. (Patient taking differently: Take 0.5 mg by mouth at bedtime as needed. ) 15 tablet 0  . aspirin EC 81 MG tablet Take 81 mg by mouth daily.    . bortezomib IV (VELCADE) 3.5 MG injection Inject 1.3 mg/m2 into the vein once a week. Day 1, 8, 15 every 21 days    . carboxymethylcellulose (REFRESH PLUS) 0.5 % SOLN Place 1 drop into both eyes in the morning, at noon, in the evening, and at bedtime.    . Cholecalciferol (VITAMIN D) 125 MCG (5000 UT) CAPS Take 5,000 Units by mouth daily. 90 capsule 1  . cyclobenzaprine (FLEXERIL) 5 MG tablet Take 1 tablet (5 mg total) by mouth 3 (three) times daily as needed for muscle spasms. (Patient not taking: Reported on 06/21/2019) 30 tablet 0  . dexamethasone (DECADRON) 4 MG tablet Take 5 tablets (20 mg) on  days 1, 8, and 15 of chemo. Repeat every 21 days. 30 tablet 3  . HYDROcodone-acetaminophen (NORCO/VICODIN) 5-325 MG tablet TAKE (1) TABLET BY MOUTH EVERY (6) HOURS AS NEEDED. 60 tablet 0  . Lactulose 20 GM/30ML SOLN Take 30 mLs (20 g total) by mouth every 3 (three) hours. Until you produce a bowel movement 450 mL 1  . lenalidomide (REVLIMID) 15 MG capsule Take one capsule daily on days 1-14 every 21 days. 14 capsule 0  . levothyroxine (SYNTHROID) 100 MCG tablet Take 1 tablet (100 mcg total) by mouth daily. 30 tablet 0  . Lidocaine HCl-Benzyl Alcohol (SALONPAS LIDOCAINE PLUS) 4-10 % CREA Apply 1 patch topically daily. 85 g 2  . NON FORMULARY Diet -Regular    . prochlorperazine (COMPAZINE) 10 MG tablet Take 1 tablet (10 mg total) by mouth every 6 (six) hours as needed (Nausea or vomiting). (Patient not taking: Reported on 07/04/2019) 30 tablet 1   No current facility-administered medications for this visit.    ALLERGIES:  No Known Allergies  PHYSICAL EXAM:  Performance status (ECOG): 1 - Symptomatic but completely ambulatory  There were no vitals filed for this visit. Wt Readings from Last 3 Encounters:  08/09/19 133 lb 12.8 oz (60.7 kg)   08/02/19 130 lb (59 kg)  07/11/19 130 lb 12.8 oz (59.3 kg)   Physical Exam Vitals reviewed.  Constitutional:      Appearance: Normal appearance.  Cardiovascular:     Rate and Rhythm: Normal rate and regular rhythm.     Pulses: Normal pulses.     Heart sounds: Normal heart sounds.  Pulmonary:     Effort: Pulmonary effort is normal.     Breath sounds: Normal breath sounds.  Musculoskeletal:     Right lower leg: Edema present.     Left lower leg: No edema.  Neurological:     General: No focal deficit present.     Mental Status: She is alert and oriented to person, place, and time.  Psychiatric:        Mood and Affect: Mood normal.        Behavior: Behavior normal.     LABORATORY DATA:  I have reviewed the labs as listed.  CBC Latest Ref Rng & Units 08/16/2019 08/09/2019 08/02/2019  WBC 4.0 - 10.5 K/uL 2.3(L) 4.1 3.7(L)  Hemoglobin 12.0 - 15.0 g/dL 9.4(L) 8.8(L) 8.7(L)  Hematocrit 36 - 46 % 30.2(L) 28.4(L) 28.3(L)  Platelets 150 - 400 K/uL 261 144(L) 139(L)   CMP Latest Ref Rng & Units 08/16/2019 08/09/2019 08/02/2019  Glucose 70 - 99 mg/dL 153(H) 118(H) 114(H)  BUN 8 - 23 mg/dL 16 13 13   Creatinine 0.44 - 1.00 mg/dL 0.57 0.62 0.56  Sodium 135 - 145 mmol/L 133(L) 133(L) 135  Potassium 3.5 - 5.1 mmol/L 4.0 3.7 3.7  Chloride 98 - 111 mmol/L 98 96(L) 97(L)  CO2 22 - 32 mmol/L 25 26 28   Calcium 8.9 - 10.3 mg/dL 8.9 8.7(L) 8.8(L)  Total Protein 6.5 - 8.1 g/dL 6.9 6.7 6.5  Total Bilirubin 0.3 - 1.2 mg/dL 0.3 0.6 0.6  Alkaline Phos 38 - 126 U/L 161(H) 156(H) 157(H)  AST 15 - 41 U/L 12(L) 31 16  ALT 0 - 44 U/L 14 29 13     DIAGNOSTIC IMAGING:  I have independently reviewed the scans and discussed with the patient.    ASSESSMENT:  1.  IgA kappa plasma cell myeloma: -IgA kappa plasma cell myeloma diagnosed on right sacral bone biopsy on 05/17/2019.  SPEP  did not show M spike.  Immunofixation shows IgA kappa.  Kappa light chains 42.8, lambda light chains at 9.9, ratio 4.32.  Beta-2  microglobulin 3.7. -PET scan on 06/04/2019 showed expansile lytic lesion involving the right sacral wing.  Lytic lesion involving T10 vertebral body.  Left seventh rib lesion.  Increased uptake in a pathological fracture involving distal aspect of the right femur. 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 -Cycle 1 of RVD started on 07/04/2019.  Revlimid 15 mg 2 weeks on/1 week off.   PLAN:  1.  IgA kappa plasma cell myeloma: -She will start her second cycle of Revlimid today. -She is feeling improved energy levels on steroids. -Reviewed myeloma labs from 08/09/2019 which showed improvement in free light chain ratio of 2.06. -I reviewed labs today.  She will proceed with her Velcade.  She will continue weekly Velcade with labs.  I will see her back in 4 weeks with repeat myeloma labs.  2.  Back pain: -She reports improvement of her back pain since we started treatment.  She is not requiring hydrocodone.  3.  Normocytic anemia: -Hemoglobin today is 9.4.  Prior ferritin was 64 and percent saturation of 9. -We will consider parenteral iron therapy if there is any decline.  4.  ID prophylaxis: -Continue aspirin 81 mg daily for thromboprophylaxis.  Continue acyclovir twice daily.   Orders placed this encounter:  No orders of the defined types were placed in this encounter.    Derek Jack, MD [place] [phone]   I, Milinda Antis, am acting as a scribe for Dr. Sanda Linger.  I, Derek Jack MD, have reviewed the above documentation for accuracy and completeness, and I agree with the above.

## 2019-08-16 NOTE — Patient Instructions (Signed)
[facility] Discharge Instructions for Patients Receiving Chemotherapy  Today you received the following chemotherapy agents   To help prevent nausea and vomiting after your treatment, we encourage you to take your nausea medication   If you develop nausea and vomiting that is not controlled by your nausea medication, call the clinic.   BELOW ARE SYMPTOMS THAT SHOULD BE REPORTED IMMEDIATELY:  *FEVER GREATER THAN 100.5 F  *CHILLS WITH OR WITHOUT FEVER  NAUSEA AND VOMITING THAT IS NOT CONTROLLED WITH YOUR NAUSEA MEDICATION  *UNUSUAL SHORTNESS OF BREATH  *UNUSUAL BRUISING OR BLEEDING  TENDERNESS IN MOUTH AND THROAT WITH OR WITHOUT PRESENCE OF ULCERS  *URINARY PROBLEMS  *BOWEL PROBLEMS  UNUSUAL RASH Items with * indicate a potential emergency and should be followed up as soon as possible.  Feel free to call the clinic should you have any questions or concerns. The clinic phone number is [phone].  Please show the CHEMO ALERT CARD at check-in to the Emergency Department and triage nurse.   

## 2019-08-23 ENCOUNTER — Inpatient Hospital Stay (HOSPITAL_COMMUNITY): Payer: Medicare Other

## 2019-08-23 ENCOUNTER — Other Ambulatory Visit: Payer: Self-pay

## 2019-08-23 ENCOUNTER — Encounter (HOSPITAL_COMMUNITY): Payer: Self-pay

## 2019-08-23 VITALS — BP 109/54 | HR 72 | Temp 97.2°F | Resp 18

## 2019-08-23 DIAGNOSIS — C9000 Multiple myeloma not having achieved remission: Secondary | ICD-10-CM

## 2019-08-23 DIAGNOSIS — Z5112 Encounter for antineoplastic immunotherapy: Secondary | ICD-10-CM | POA: Diagnosis not present

## 2019-08-23 LAB — CBC WITH DIFFERENTIAL/PLATELET
Abs Immature Granulocytes: 0.03 10*3/uL (ref 0.00–0.07)
Basophils Absolute: 0 10*3/uL (ref 0.0–0.1)
Basophils Relative: 1 %
Eosinophils Absolute: 0 10*3/uL (ref 0.0–0.5)
Eosinophils Relative: 0 %
HCT: 31.9 % — ABNORMAL LOW (ref 36.0–46.0)
Hemoglobin: 10 g/dL — ABNORMAL LOW (ref 12.0–15.0)
Immature Granulocytes: 1 %
Lymphocytes Relative: 8 %
Lymphs Abs: 0.4 10*3/uL — ABNORMAL LOW (ref 0.7–4.0)
MCH: 29.7 pg (ref 26.0–34.0)
MCHC: 31.3 g/dL (ref 30.0–36.0)
MCV: 94.7 fL (ref 80.0–100.0)
Monocytes Absolute: 0 10*3/uL — ABNORMAL LOW (ref 0.1–1.0)
Monocytes Relative: 1 %
Neutro Abs: 3.9 10*3/uL (ref 1.7–7.7)
Neutrophils Relative %: 89 %
Platelets: 239 10*3/uL (ref 150–400)
RBC: 3.37 MIL/uL — ABNORMAL LOW (ref 3.87–5.11)
RDW: 16.5 % — ABNORMAL HIGH (ref 11.5–15.5)
WBC: 4.4 10*3/uL (ref 4.0–10.5)
nRBC: 0 % (ref 0.0–0.2)

## 2019-08-23 LAB — COMPREHENSIVE METABOLIC PANEL
ALT: 13 U/L (ref 0–44)
AST: 16 U/L (ref 15–41)
Albumin: 3.9 g/dL (ref 3.5–5.0)
Alkaline Phosphatase: 146 U/L — ABNORMAL HIGH (ref 38–126)
Anion gap: 11 (ref 5–15)
BUN: 17 mg/dL (ref 8–23)
CO2: 26 mmol/L (ref 22–32)
Calcium: 9.5 mg/dL (ref 8.9–10.3)
Chloride: 97 mmol/L — ABNORMAL LOW (ref 98–111)
Creatinine, Ser: 0.58 mg/dL (ref 0.44–1.00)
GFR calc Af Amer: >60 mL/min (ref >60)
GFR calc non Af Amer: >60 mL/min (ref >60)
Glucose, Bld: 144 mg/dL — ABNORMAL HIGH (ref 70–99)
Potassium: 3.7 mmol/L (ref 3.5–5.1)
Sodium: 134 mmol/L — ABNORMAL LOW (ref 135–145)
Total Bilirubin: 0.5 mg/dL (ref 0.3–1.2)
Total Protein: 7 g/dL (ref 6.5–8.1)

## 2019-08-23 MED ORDER — PROCHLORPERAZINE MALEATE 10 MG PO TABS
10.0000 mg | ORAL_TABLET | Freq: Once | ORAL | Status: AC
  Administered 2019-08-23: 10 mg via ORAL
  Filled 2019-08-23: qty 1

## 2019-08-23 MED ORDER — BORTEZOMIB CHEMO SQ INJECTION 3.5 MG (2.5MG/ML)
1.3000 mg/m2 | Freq: Once | INTRAMUSCULAR | Status: AC
  Administered 2019-08-23: 2.25 mg via SUBCUTANEOUS
  Filled 2019-08-23: qty 0.9

## 2019-08-23 NOTE — Patient Instructions (Signed)
[facility] Discharge Instructions for Patients Receiving Chemotherapy   Beginning January 23rd 2017 lab work for the Cancer Center will be done in the  Main lab at [facility] on 1st floor. If you have a lab appointment with the Cancer Center please come in thru the  Main Entrance and check in at the main information desk   Today you received the following chemotherapy agents Velcade injection. Follow-up as scheduled  To help prevent nausea and vomiting after your treatment, we encourage you to take your nausea medication   If you develop nausea and vomiting, or diarrhea that is not controlled by your medication, call the clinic.  The clinic phone number is [phone]. Office hours are Monday-Friday 8:30am-5:00pm.  BELOW ARE SYMPTOMS THAT SHOULD BE REPORTED IMMEDIATELY:  *FEVER GREATER THAN 101.0 F  *CHILLS WITH OR WITHOUT FEVER  NAUSEA AND VOMITING THAT IS NOT CONTROLLED WITH YOUR NAUSEA MEDICATION  *UNUSUAL SHORTNESS OF BREATH  *UNUSUAL BRUISING OR BLEEDING  TENDERNESS IN MOUTH AND THROAT WITH OR WITHOUT PRESENCE OF ULCERS  *URINARY PROBLEMS  *BOWEL PROBLEMS  UNUSUAL RASH Items with * indicate a potential emergency and should be followed up as soon as possible. If you have an emergency after office hours please contact your primary care physician or go to the nearest emergency department.  Please call the clinic during office hours if you have any questions or concerns.   You may also contact the Patient Navigator at [phone] should you have any questions or need assistance in obtaining follow up care.      Resources For Cancer Patients and their Caregivers ? American Cancer Society: Can assist with transportation, wigs, general needs, runs Look Good Feel Better.        1-888-227-6333 ? Cancer Care: Provides financial assistance, online support groups, medication/co-pay assistance.  1-800-813-HOPE (4673) ? [facility] Assists [place] cancer patients and their families through emotional , educational and financial support.  336-427-4357 ? [place] DSS Where to apply for food stamps, Medicaid and utility assistance. 336-342-1394 ? [facility]: Transportation to medical appointments. 336-347-2287 ? Social Security Administration: May apply for disability if have a Stage IV cancer. 336-342-7796 1-800-772-1213 ? [place] Aging, Disability and Transit Services: Assists with nutrition, care and transit needs. 336-349-2343         

## 2019-08-23 NOTE — Progress Notes (Signed)
1430 Labs reviewed with Dr. Delton Coombes and pt approved for Velcade injection today per MD                                                       Courtney Arnold tolerated Velcade injection well without complaints or incident. VSS Pt discharged via wheelchair in satisfactory condition accompanied by family member

## 2019-08-24 ENCOUNTER — Other Ambulatory Visit (HOSPITAL_COMMUNITY): Payer: Self-pay | Admitting: *Deleted

## 2019-08-24 DIAGNOSIS — C9000 Multiple myeloma not having achieved remission: Secondary | ICD-10-CM

## 2019-08-24 MED ORDER — LENALIDOMIDE 15 MG PO CAPS: ORAL_CAPSULE | ORAL | 0 refills | Status: DC

## 2019-08-30 ENCOUNTER — Other Ambulatory Visit: Payer: Self-pay

## 2019-08-30 ENCOUNTER — Encounter (HOSPITAL_COMMUNITY): Payer: Self-pay

## 2019-08-30 ENCOUNTER — Inpatient Hospital Stay (HOSPITAL_COMMUNITY): Payer: Medicare Other

## 2019-08-30 VITALS — BP 100/62 | HR 70 | Temp 98.2°F | Resp 18

## 2019-08-30 DIAGNOSIS — C9000 Multiple myeloma not having achieved remission: Secondary | ICD-10-CM

## 2019-08-30 DIAGNOSIS — Z5112 Encounter for antineoplastic immunotherapy: Secondary | ICD-10-CM | POA: Diagnosis not present

## 2019-08-30 LAB — CBC WITH DIFFERENTIAL/PLATELET
Abs Immature Granulocytes: 0.03 10*3/uL (ref 0.00–0.07)
Basophils Absolute: 0 10*3/uL (ref 0.0–0.1)
Basophils Relative: 1 %
Eosinophils Absolute: 0 10*3/uL (ref 0.0–0.5)
Eosinophils Relative: 0 %
HCT: 33.5 % — ABNORMAL LOW (ref 36.0–46.0)
Hemoglobin: 10.2 g/dL — ABNORMAL LOW (ref 12.0–15.0)
Immature Granulocytes: 1 %
Lymphocytes Relative: 14 %
Lymphs Abs: 0.4 10*3/uL — ABNORMAL LOW (ref 0.7–4.0)
MCH: 28.8 pg (ref 26.0–34.0)
MCHC: 30.4 g/dL (ref 30.0–36.0)
MCV: 94.6 fL (ref 80.0–100.0)
Monocytes Absolute: 0.1 10*3/uL (ref 0.1–1.0)
Monocytes Relative: 4 %
Neutro Abs: 2.3 10*3/uL (ref 1.7–7.7)
Neutrophils Relative %: 80 %
Platelets: 200 10*3/uL (ref 150–400)
RBC: 3.54 MIL/uL — ABNORMAL LOW (ref 3.87–5.11)
RDW: 16.3 % — ABNORMAL HIGH (ref 11.5–15.5)
WBC: 2.8 10*3/uL — ABNORMAL LOW (ref 4.0–10.5)
nRBC: 0 % (ref 0.0–0.2)

## 2019-08-30 LAB — COMPREHENSIVE METABOLIC PANEL
ALT: 28 U/L (ref 0–44)
AST: 28 U/L (ref 15–41)
Albumin: 4 g/dL (ref 3.5–5.0)
Alkaline Phosphatase: 143 U/L — ABNORMAL HIGH (ref 38–126)
Anion gap: 9 (ref 5–15)
BUN: 19 mg/dL (ref 8–23)
CO2: 26 mmol/L (ref 22–32)
Calcium: 9 mg/dL (ref 8.9–10.3)
Chloride: 98 mmol/L (ref 98–111)
Creatinine, Ser: 0.69 mg/dL (ref 0.44–1.00)
GFR calc Af Amer: >60 mL/min (ref >60)
GFR calc non Af Amer: >60 mL/min (ref >60)
Glucose, Bld: 147 mg/dL — ABNORMAL HIGH (ref 70–99)
Potassium: 3.5 mmol/L (ref 3.5–5.1)
Sodium: 133 mmol/L — ABNORMAL LOW (ref 135–145)
Total Bilirubin: 0.4 mg/dL (ref 0.3–1.2)
Total Protein: 7 g/dL (ref 6.5–8.1)

## 2019-08-30 MED ORDER — PROCHLORPERAZINE MALEATE 10 MG PO TABS
ORAL_TABLET | ORAL | Status: AC
  Filled 2019-08-30: qty 1

## 2019-08-30 MED ORDER — PROCHLORPERAZINE MALEATE 10 MG PO TABS
10.0000 mg | ORAL_TABLET | Freq: Once | ORAL | Status: AC
  Administered 2019-08-30: 10 mg via ORAL

## 2019-08-30 MED ORDER — BORTEZOMIB CHEMO SQ INJECTION 3.5 MG (2.5MG/ML)
1.3000 mg/m2 | Freq: Once | INTRAMUSCULAR | Status: AC
  Administered 2019-08-30: 2.25 mg via SUBCUTANEOUS
  Filled 2019-08-30: qty 0.9

## 2019-08-30 NOTE — Progress Notes (Signed)
Courtney Arnold tolerated Velcade injection well without complaints or incident. Labs reviewed prior to administering this medication. Pt continues to take her Revlimid as prescribed without issues. VSS Pt discharged via wheelchair in satisfactory condition accompanied by family member

## 2019-08-30 NOTE — Patient Instructions (Signed)
[facility] Discharge Instructions for Patients Receiving Chemotherapy   Beginning January 23rd 2017 lab work for the Cancer Center will be done in the  Main lab at [facility] on 1st floor. If you have a lab appointment with the Cancer Center please come in thru the  Main Entrance and check in at the main information desk   Today you received the following chemotherapy agents Velcade injection. Follow-up as scheduled  To help prevent nausea and vomiting after your treatment, we encourage you to take your nausea medication   If you develop nausea and vomiting, or diarrhea that is not controlled by your medication, call the clinic.  The clinic phone number is [phone]. Office hours are Monday-Friday 8:30am-5:00pm.  BELOW ARE SYMPTOMS THAT SHOULD BE REPORTED IMMEDIATELY:  *FEVER GREATER THAN 101.0 F  *CHILLS WITH OR WITHOUT FEVER  NAUSEA AND VOMITING THAT IS NOT CONTROLLED WITH YOUR NAUSEA MEDICATION  *UNUSUAL SHORTNESS OF BREATH  *UNUSUAL BRUISING OR BLEEDING  TENDERNESS IN MOUTH AND THROAT WITH OR WITHOUT PRESENCE OF ULCERS  *URINARY PROBLEMS  *BOWEL PROBLEMS  UNUSUAL RASH Items with * indicate a potential emergency and should be followed up as soon as possible. If you have an emergency after office hours please contact your primary care physician or go to the nearest emergency department.  Please call the clinic during office hours if you have any questions or concerns.   You may also contact the Patient Navigator at [phone] should you have any questions or need assistance in obtaining follow up care.      Resources For Cancer Patients and their Caregivers ? American Cancer Society: Can assist with transportation, wigs, general needs, runs Look Good Feel Better.        1-888-227-6333 ? Cancer Care: Provides financial assistance, online support groups, medication/co-pay assistance.  1-800-813-HOPE (4673) ? [facility] Assists [place] cancer patients and their families through emotional , educational and financial support.  336-427-4357 ? [place] DSS Where to apply for food stamps, Medicaid and utility assistance. 336-342-1394 ? [facility]: Transportation to medical appointments. 336-347-2287 ? Social Security Administration: May apply for disability if have a Stage IV cancer. 336-342-7796 1-800-772-1213 ? [place] Aging, Disability and Transit Services: Assists with nutrition, care and transit needs. 336-349-2343         

## 2019-09-06 ENCOUNTER — Inpatient Hospital Stay (HOSPITAL_COMMUNITY): Payer: Medicare Other

## 2019-09-06 ENCOUNTER — Encounter (HOSPITAL_COMMUNITY): Payer: Self-pay

## 2019-09-06 ENCOUNTER — Inpatient Hospital Stay (HOSPITAL_COMMUNITY): Payer: Medicare Other | Attending: Hematology

## 2019-09-06 ENCOUNTER — Other Ambulatory Visit: Payer: Self-pay

## 2019-09-06 VITALS — BP 114/59 | HR 77 | Temp 98.0°F | Resp 18

## 2019-09-06 DIAGNOSIS — C9000 Multiple myeloma not having achieved remission: Secondary | ICD-10-CM

## 2019-09-06 DIAGNOSIS — C9001 Multiple myeloma in remission: Secondary | ICD-10-CM | POA: Diagnosis not present

## 2019-09-06 DIAGNOSIS — Z87891 Personal history of nicotine dependence: Secondary | ICD-10-CM | POA: Insufficient documentation

## 2019-09-06 DIAGNOSIS — Z5112 Encounter for antineoplastic immunotherapy: Secondary | ICD-10-CM | POA: Diagnosis not present

## 2019-09-06 DIAGNOSIS — E538 Deficiency of other specified B group vitamins: Secondary | ICD-10-CM | POA: Insufficient documentation

## 2019-09-06 LAB — CBC WITH DIFFERENTIAL/PLATELET
Abs Immature Granulocytes: 0.06 10*3/uL (ref 0.00–0.07)
Basophils Absolute: 0 10*3/uL (ref 0.0–0.1)
Basophils Relative: 1 %
Eosinophils Absolute: 0 10*3/uL (ref 0.0–0.5)
Eosinophils Relative: 0 %
HCT: 32.2 % — ABNORMAL LOW (ref 36.0–46.0)
Hemoglobin: 10.3 g/dL — ABNORMAL LOW (ref 12.0–15.0)
Immature Granulocytes: 1 %
Lymphocytes Relative: 12 %
Lymphs Abs: 0.5 10*3/uL — ABNORMAL LOW (ref 0.7–4.0)
MCH: 29.9 pg (ref 26.0–34.0)
MCHC: 32 g/dL (ref 30.0–36.0)
MCV: 93.3 fL (ref 80.0–100.0)
Monocytes Absolute: 0.1 10*3/uL (ref 0.1–1.0)
Monocytes Relative: 3 %
Neutro Abs: 3.6 10*3/uL (ref 1.7–7.7)
Neutrophils Relative %: 83 %
Platelets: 302 10*3/uL (ref 150–400)
RBC: 3.45 MIL/uL — ABNORMAL LOW (ref 3.87–5.11)
RDW: 16.4 % — ABNORMAL HIGH (ref 11.5–15.5)
WBC: 4.3 10*3/uL (ref 4.0–10.5)
nRBC: 0 % (ref 0.0–0.2)

## 2019-09-06 LAB — COMPREHENSIVE METABOLIC PANEL
ALT: 23 U/L (ref 0–44)
AST: 20 U/L (ref 15–41)
Albumin: 4 g/dL (ref 3.5–5.0)
Alkaline Phosphatase: 152 U/L — ABNORMAL HIGH (ref 38–126)
Anion gap: 11 (ref 5–15)
BUN: 14 mg/dL (ref 8–23)
CO2: 25 mmol/L (ref 22–32)
Calcium: 9.1 mg/dL (ref 8.9–10.3)
Chloride: 97 mmol/L — ABNORMAL LOW (ref 98–111)
Creatinine, Ser: 0.59 mg/dL (ref 0.44–1.00)
GFR calc Af Amer: >60 mL/min (ref >60)
GFR calc non Af Amer: >60 mL/min (ref >60)
Glucose, Bld: 167 mg/dL — ABNORMAL HIGH (ref 70–99)
Potassium: 3.9 mmol/L (ref 3.5–5.1)
Sodium: 133 mmol/L — ABNORMAL LOW (ref 135–145)
Total Bilirubin: 0.4 mg/dL (ref 0.3–1.2)
Total Protein: 7.2 g/dL (ref 6.5–8.1)

## 2019-09-06 MED ORDER — PROCHLORPERAZINE MALEATE 10 MG PO TABS
ORAL_TABLET | ORAL | Status: AC
  Filled 2019-09-06: qty 1

## 2019-09-06 MED ORDER — PROCHLORPERAZINE MALEATE 10 MG PO TABS
10.0000 mg | ORAL_TABLET | Freq: Once | ORAL | Status: AC
  Administered 2019-09-06: 10 mg via ORAL

## 2019-09-06 MED ORDER — BORTEZOMIB CHEMO SQ INJECTION 3.5 MG (2.5MG/ML)
1.3000 mg/m2 | Freq: Once | INTRAMUSCULAR | Status: AC
  Administered 2019-09-06: 2.25 mg via SUBCUTANEOUS
  Filled 2019-09-06: qty 0.9

## 2019-09-06 NOTE — Progress Notes (Signed)
Labs meet parameters for treatment today. Proceed as planned.   Treatment given per orders. Patient tolerated it well without problems. Vitals stable and discharged home from clinic via wheelchair. Follow up as scheduled.

## 2019-09-06 NOTE — Patient Instructions (Signed)
[facility] at [facility]  Discharge Instructions:   _______________________________________________________________  Thank you for choosing [facility] at [facility] to provide your oncology and hematology care.  To afford each patient quality time with our providers, please arrive at least 15 minutes before your scheduled appointment.  You need to re-schedule your appointment if you arrive 10 or more minutes late.  We strive to give you quality time with our providers, and arriving late affects you and other patients whose appointments are after yours.  Also, if you no show three or more times for appointments you may be dismissed from the clinic.  Again, thank you for choosing [facility] at [facility]. Our hope is that these requests will allow you access to exceptional care and in a timely manner. _______________________________________________________________  If you have questions after your visit, please contact our office at [phone] between the hours of 8:30 a.m. and 5:00 p.m. Voicemails left after 4:30 p.m. will not be returned until the following business day. _______________________________________________________________  For prescription refill requests, have your pharmacy contact our office. _______________________________________________________________  Recommendations made by the consultant and any test results will be sent to your referring physician. _______________________________________________________________ 

## 2019-09-12 ENCOUNTER — Other Ambulatory Visit (HOSPITAL_COMMUNITY): Payer: Self-pay | Admitting: *Deleted

## 2019-09-12 DIAGNOSIS — C9000 Multiple myeloma not having achieved remission: Secondary | ICD-10-CM

## 2019-09-12 MED ORDER — LENALIDOMIDE 15 MG PO CAPS: ORAL_CAPSULE | ORAL | 0 refills | Status: DC

## 2019-09-12 NOTE — Telephone Encounter (Signed)
Chart reviewed, per last office note, revlimid refilled.

## 2019-09-13 ENCOUNTER — Inpatient Hospital Stay (HOSPITAL_COMMUNITY): Payer: Medicare Other

## 2019-09-13 ENCOUNTER — Inpatient Hospital Stay (HOSPITAL_BASED_OUTPATIENT_CLINIC_OR_DEPARTMENT_OTHER): Payer: Medicare Other | Admitting: Hematology

## 2019-09-13 ENCOUNTER — Other Ambulatory Visit: Payer: Self-pay

## 2019-09-13 VITALS — BP 122/54 | HR 68 | Temp 98.0°F | Resp 18 | Wt 135.1 lb

## 2019-09-13 DIAGNOSIS — Z5112 Encounter for antineoplastic immunotherapy: Secondary | ICD-10-CM | POA: Diagnosis not present

## 2019-09-13 DIAGNOSIS — C9000 Multiple myeloma not having achieved remission: Secondary | ICD-10-CM | POA: Diagnosis not present

## 2019-09-13 DIAGNOSIS — E538 Deficiency of other specified B group vitamins: Secondary | ICD-10-CM | POA: Diagnosis not present

## 2019-09-13 DIAGNOSIS — Z87891 Personal history of nicotine dependence: Secondary | ICD-10-CM | POA: Diagnosis not present

## 2019-09-13 DIAGNOSIS — C9001 Multiple myeloma in remission: Secondary | ICD-10-CM | POA: Diagnosis not present

## 2019-09-13 LAB — CBC WITH DIFFERENTIAL/PLATELET
Abs Immature Granulocytes: 0.05 10*3/uL (ref 0.00–0.07)
Basophils Absolute: 0 10*3/uL (ref 0.0–0.1)
Basophils Relative: 0 %
Eosinophils Absolute: 0 10*3/uL (ref 0.0–0.5)
Eosinophils Relative: 0 %
HCT: 32.2 % — ABNORMAL LOW (ref 36.0–46.0)
Hemoglobin: 10.1 g/dL — ABNORMAL LOW (ref 12.0–15.0)
Immature Granulocytes: 1 %
Lymphocytes Relative: 7 %
Lymphs Abs: 0.4 10*3/uL — ABNORMAL LOW (ref 0.7–4.0)
MCH: 29.2 pg (ref 26.0–34.0)
MCHC: 31.4 g/dL (ref 30.0–36.0)
MCV: 93.1 fL (ref 80.0–100.0)
Monocytes Absolute: 0 10*3/uL — ABNORMAL LOW (ref 0.1–1.0)
Monocytes Relative: 1 %
Neutro Abs: 5.1 10*3/uL (ref 1.7–7.7)
Neutrophils Relative %: 91 %
Platelets: 283 10*3/uL (ref 150–400)
RBC: 3.46 MIL/uL — ABNORMAL LOW (ref 3.87–5.11)
RDW: 16.7 % — ABNORMAL HIGH (ref 11.5–15.5)
WBC: 5.6 10*3/uL (ref 4.0–10.5)
nRBC: 0 % (ref 0.0–0.2)

## 2019-09-13 LAB — COMPREHENSIVE METABOLIC PANEL
ALT: 28 U/L (ref 0–44)
AST: 29 U/L (ref 15–41)
Albumin: 3.9 g/dL (ref 3.5–5.0)
Alkaline Phosphatase: 140 U/L — ABNORMAL HIGH (ref 38–126)
Anion gap: 12 (ref 5–15)
BUN: 18 mg/dL (ref 8–23)
CO2: 25 mmol/L (ref 22–32)
Calcium: 8.9 mg/dL (ref 8.9–10.3)
Chloride: 97 mmol/L — ABNORMAL LOW (ref 98–111)
Creatinine, Ser: 0.61 mg/dL (ref 0.44–1.00)
GFR calc Af Amer: >60 mL/min (ref >60)
GFR calc non Af Amer: >60 mL/min (ref >60)
Glucose, Bld: 153 mg/dL — ABNORMAL HIGH (ref 70–99)
Potassium: 3.7 mmol/L (ref 3.5–5.1)
Sodium: 134 mmol/L — ABNORMAL LOW (ref 135–145)
Total Bilirubin: 0.5 mg/dL (ref 0.3–1.2)
Total Protein: 7 g/dL (ref 6.5–8.1)

## 2019-09-13 LAB — LACTATE DEHYDROGENASE: LDH: 148 U/L (ref 98–192)

## 2019-09-13 MED ORDER — PROCHLORPERAZINE MALEATE 10 MG PO TABS
10.0000 mg | ORAL_TABLET | Freq: Once | ORAL | Status: AC
  Administered 2019-09-13: 10 mg via ORAL
  Filled 2019-09-13: qty 1

## 2019-09-13 MED ORDER — BORTEZOMIB CHEMO SQ INJECTION 3.5 MG (2.5MG/ML)
1.3000 mg/m2 | Freq: Once | INTRAMUSCULAR | Status: AC
  Administered 2019-09-13: 2.25 mg via SUBCUTANEOUS
  Filled 2019-09-13: qty 0.9

## 2019-09-13 MED ORDER — CYANOCOBALAMIN 1000 MCG/ML IJ SOLN
1000.0000 ug | Freq: Once | INTRAMUSCULAR | Status: AC
  Administered 2019-09-13: 1000 ug via INTRAMUSCULAR
  Filled 2019-09-13: qty 1

## 2019-09-13 NOTE — Patient Instructions (Addendum)
[place] at [facility] Discharge Instructions  You were seen today by Dr. Delton Coombes. He went over your recent results. You received your treatment and vitamin B12 shot today. Continue your usual care with your primary care provider. Dr. Delton Coombes will see you back in 1 month for labs and follow up.   Thank you for choosing [place] at [facility] to provide your oncology and hematology care.  To afford each patient quality time with our provider, please arrive at least 15 minutes before your scheduled appointment time.   If you have a lab appointment with the [facility] please come in thru the Main Entrance and check in at the main information desk  You need to re-schedule your appointment should you arrive 10 or more minutes late.  We strive to give you quality time with our providers, and arriving late affects you and other patients whose appointments are after yours.  Also, if you no show three or more times for appointments you may be dismissed from the clinic at the providers discretion.     Again, thank you for choosing [facility].  Our hope is that these requests will decrease the amount of time that you wait before being seen by our physicians.       _____________________________________________________________  Should you have questions after your visit to [facility], please contact our office at [phone] between the hours of 8:00 a.m. and 4:30 p.m.  Voicemails left after 4:00 p.m. will not be returned until the following business day.  For prescription refill requests, have your pharmacy contact our office and allow 72 hours.    Cancer Center Support Programs:   > Cancer Support Group  2nd Tuesday of the month 1pm-2pm, Journey Room

## 2019-09-13 NOTE — Progress Notes (Signed)
Patient tolerated B12 and  Velcade injection with no complaints voiced.  Lab work reviewed.  See MAR for details.  Injection sites clean and dry with no bruising or swelling noted.  Band aids applied.  VSS.  Patient left in satisfactory condition with no s/s of distress noted.

## 2019-09-13 NOTE — Progress Notes (Signed)
Courtney Arnold, [place] [address]   CLINIC:  Medical Oncology/Hematology  PCP:  [name], [place] / [place] [address] [phone]   REASON FOR VISIT:  Follow-up for IgA kappa plasma cell myeloma  CURRENT THERAPY: Velcade & Revlimid  BRIEF ONCOLOGIC HISTORY:  Oncology History  Multiple myeloma not having achieved remission ([place])  06/07/2019 Initial Diagnosis   Multiple myeloma not having achieved remission ([place])   07/04/2019 -  Chemotherapy   The patient had dexamethasone (DECADRON) 4 MG tablet, 1 of 1 cycle, Start date: 06/21/2019, End date: -- lenalidomide (REVLIMID) 15 MG capsule, 1 of 1 cycle, Start date: 09/12/2019, End date: -- bortezomib SQ (VELCADE) chemo injection 2.25 mg, 1.3 mg/m2 = 2.25 mg, Subcutaneous,  Once, 4 of 8 cycles Administration: 2.25 mg (07/04/2019), 2.25 mg (07/11/2019), 2.25 mg (07/26/2019), 2.25 mg (08/02/2019), 2.25 mg (08/09/2019), 2.25 mg (08/16/2019), 2.25 mg (08/23/2019), 2.25 mg (08/30/2019), 2.25 mg (09/06/2019)  for chemotherapy treatment.      CANCER STAGING: Cancer Staging No matching staging information was found for the patient.  INTERVAL HISTORY:  [name], a 79 y.o. female, returns for routine follow-up and consideration for next cycle of chemotherapy. [place] was last seen on 08/16/2019.  Due for cycle #4 of Velcade today.   Today she is accompanied by her daughter. Overall, she tells me she has been feeling pretty well. She has been taking the Revlimid daily and her 14th dose will be on 09/19/2019. She reports that she does not take the Norco for her back pain since it resolved. She denies any numbness/tingling or N/V/D or jaw pain.  Overall, she feels ready for next cycle of chemo today.    REVIEW OF SYSTEMS:  Review of Systems  Constitutional: Positive for fatigue (mild). Negative for appetite change.  Gastrointestinal: Negative for abdominal pain, nausea and vomiting.    Musculoskeletal: Negative for back pain.  Neurological: Negative for numbness.  All other systems reviewed and are negative.   PAST MEDICAL/SURGICAL HISTORY:  Past Medical History:  Diagnosis Date  . Atrial fibrillation ([facility]) 2011   Postop, spontaneous conversion to normal sinus after one hour  . Compression fracture 07/24/09   T12; kyphoplasty  . History of echocardiogram 5/11   EF 65%  . Hypertension   . Thyroid disease   . Tobacco abuse    Past Surgical History:  Procedure Laterality Date  . BACK SURGERY    . BACK SURGERY  06/06/2015  . BREAST EXCISIONAL BIOPSY Left    50 years ago  benign  . CHOLECYSTECTOMY N/A 04/25/2015   Procedure: LAPAROSCOPIC CHOLECYSTECTOMY WITH INTRAOPERATIVE CHOLANGIOGRAM;  Surgeon: Mickeal Skinner, MD;  Location: [facility];  Service: General;  Laterality: N/A;  . COLONOSCOPY N/A 11/26/2015   Procedure: COLONOSCOPY;  Surgeon: Daneil Dolin, MD;  Location: [facility];  Service: Endoscopy;  Laterality: N/A;  7:30 am  . ERCP N/A 04/14/2015   Procedure: ENDOSCOPIC RETROGRADE CHOLANGIOPANCREATOGRAPHY (ERCP) Biliary Sphincterotomy, 10x7 stent placement Dilated bilary system just not well seen;  Surgeon: Rogene Houston, MD;  Location: [facility];  Service: Endoscopy;  Laterality: N/A;  . ERCP N/A 06/12/2015   Procedure: ENDOSCOPIC RETROGRADE CHOLANGIOPANCREATOGRAPHY (ERCP);  Surgeon: Rogene Houston, MD;  Location: [facility];  Service: Endoscopy;  Laterality: N/A;  . ESOPHAGOGASTRODUODENOSCOPY N/A 06/12/2015   Procedure: DIAGNOSTIC ESOPHAGOGASTRODUODENOSCOPY (EGD);  Surgeon: Rogene Houston, MD;  [facility];  Service: Endoscopy;  Laterality: N/A;  .  FEMUR IM NAIL Right 05/11/2019   Procedure: RETROGRADE INTRAMEDULLARY NAIL FEMORAL;  Surgeon: Shona Needles, MD;  Location: [facility];  Service: Orthopedics;  Laterality: Right;  . STENT REMOVAL  06/12/2015   Procedure: STENT REMOVAL ;  Surgeon: Rogene Houston, MD;  Location: [facility];  Service:  Endoscopy;;    SOCIAL HISTORY:  Social History   Socioeconomic History  . Marital status: Widowed    Spouse name: Not on file  . Number of children: 2  . Years of education: Not on file  . Highest education level: Not on file  Occupational History  . Occupation: retired  Tobacco Use  . Smoking status: Former Smoker    Packs/day: 0.15    Years: 20.00    Pack years: 3.00    Quit date: 03/08/2013    Years since quitting: 6.5  . Smokeless tobacco: Never Used  Vaping Use  . Vaping Use: Never used  Substance and Sexual Activity  . Alcohol use: No    Alcohol/week: 0.0 standard drinks  . Drug use: No  . Sexual activity: Not Currently  Other Topics Concern  . Not on file  Social History Narrative   Active in gardens and does yard work.   Social Determinants of Health   Financial Resource Strain: Low Risk   . Difficulty of Paying Living Expenses: Not hard at all  Food Insecurity: No Food Insecurity  . Worried About Charity fundraiser in the Last Year: Never true  . Ran Out of Food in the Last Year: Never true  Transportation Needs: No Transportation Needs  . Lack of Transportation (Medical): No  . Lack of Transportation (Non-Medical): No  Physical Activity: Inactive  . Days of Exercise per Week: 0 days  . Minutes of Exercise per Session: 0 min  Stress: No Stress Concern Present  . Feeling of Stress : Not at all  Social Connections:   . Frequency of Communication with Friends and Family:   . Frequency of Social Gatherings with Friends and Family:   . Attends Religious Services:   . Active Member of Clubs or Organizations:   . Attends Archivist Meetings:   Marland Kitchen Marital Status:   Intimate Partner Violence: Not At Risk  . Fear of Current or Ex-Partner: No  . Emotionally Abused: No  . Physically Abused: No  . Sexually Abused: No    FAMILY HISTORY:  Family History  Problem Relation Age of Onset  . Heart attack Father   . Stroke Father   . Breast cancer  Sister   . Thyroid disease Neg Hx   . Colon cancer Neg Hx     CURRENT MEDICATIONS:  Current Outpatient Medications  Medication Sig Dispense Refill  . acyclovir (ZOVIRAX) 400 MG tablet Take 1 tablet (400 mg total) by mouth 2 (two) times daily. 60 tablet 3  . aspirin EC 81 MG tablet Take 81 mg by mouth daily.    . bortezomib IV (VELCADE) 3.5 MG injection Inject 1.3 mg/m2 into the vein once a week. Day 1, 8, 15 every 21 days    . carboxymethylcellulose (REFRESH PLUS) 0.5 % SOLN Place 1 drop into both eyes in the morning, at noon, in the evening, and at bedtime.    . Cholecalciferol (VITAMIN D) 125 MCG (5000 UT) CAPS Take 5,000 Units by mouth daily. 90 capsule 1  . dexamethasone (DECADRON) 4 MG tablet Take 5 tablets (20 mg) on days 1, 8, and 15 of chemo. Repeat every  21 days. 30 tablet 3  . Lactulose 20 GM/30ML SOLN Take 30 mLs (20 g total) by mouth every 3 (three) hours. Until you produce a bowel movement 450 mL 1  . lenalidomide (REVLIMID) 15 MG capsule Take one capsule daily on days 1-14 every 21 days. 14 capsule 0  . levothyroxine (SYNTHROID) 100 MCG tablet Take 1 tablet (100 mcg total) by mouth daily. 30 tablet 0  . Lidocaine HCl-Benzyl Alcohol (SALONPAS LIDOCAINE PLUS) 4-10 % CREA Apply 1 patch topically daily. 85 g 2  . NON FORMULARY Diet -Regular    . ALPRAZolam (XANAX) 0.5 MG tablet Take 1 tablet (0.5 mg total) by mouth at bedtime. (Patient not taking: Reported on 09/13/2019) 15 tablet 0  . cyclobenzaprine (FLEXERIL) 5 MG tablet Take 1 tablet (5 mg total) by mouth 3 (three) times daily as needed for muscle spasms. (Patient not taking: Reported on 09/13/2019) 30 tablet 0  . HYDROcodone-acetaminophen (NORCO/VICODIN) 5-325 MG tablet TAKE (1) TABLET BY MOUTH EVERY (6) HOURS AS NEEDED. (Patient not taking: Reported on 09/13/2019) 60 tablet 0  . prochlorperazine (COMPAZINE) 10 MG tablet Take 1 tablet (10 mg total) by mouth every 6 (six) hours as needed (Nausea or vomiting). (Patient not taking:  Reported on 09/13/2019) 30 tablet 1   No current facility-administered medications for this visit.   Facility-Administered Medications Ordered in Other Visits  Medication Dose Route Frequency Provider Last Rate Last Admin  . bortezomib SQ (VELCADE) chemo injection 2.25 mg  1.3 mg/m2 (Treatment Plan Recorded) Subcutaneous Once Derek Jack, MD      . cyanocobalamin ((VITAMIN B-12)) injection 1,000 mcg  1,000 mcg Intramuscular Once Derek Jack, MD      . prochlorperazine (COMPAZINE) tablet 10 mg  10 mg Oral Once Derek Jack, MD        ALLERGIES:  No Known Allergies  PHYSICAL EXAM:  Performance status (ECOG): 1 - Symptomatic but completely ambulatory  Vitals:   09/13/19 1402  BP: (!) 122/54  Pulse: 68  Resp: 18  Temp: 98 F (36.7 C)  SpO2: 97%   Wt Readings from Last 3 Encounters:  09/13/19 61.3 kg (135 lb 1.6 oz)  08/16/19 60.4 kg (133 lb 1.6 oz)  08/09/19 60.7 kg (133 lb 12.8 oz)   Physical Exam Vitals reviewed.  Constitutional:      Appearance: Normal appearance.  Cardiovascular:     Rate and Rhythm: Normal rate and regular rhythm.     Pulses: Normal pulses.     Heart sounds: Normal heart sounds.  Pulmonary:     Effort: Pulmonary effort is normal.     Breath sounds: Normal breath sounds.  Abdominal:     Palpations: Abdomen is soft. There is no mass.     Tenderness: There is no abdominal tenderness.  Musculoskeletal:     Right lower leg: No edema.     Left lower leg: No edema.  Neurological:     General: No focal deficit present.     Mental Status: She is alert and oriented to person, place, and time.  Psychiatric:        Mood and Affect: Mood normal.        Behavior: Behavior normal.     LABORATORY DATA:  I have reviewed the labs as listed.  CBC Latest Ref Rng & Units 09/13/2019 09/06/2019 08/30/2019  WBC 4.0 - 10.5 K/uL 5.6 4.3 2.8(L)  Hemoglobin 12.0 - 15.0 g/dL 10.1(L) 10.3(L) 10.2(L)  Hematocrit 36 - 46 % 32.2(L) 32.2(L) 33.5(L)  Platelets 150 - 400 K/uL 283 302 200   CMP Latest Ref Rng & Units 09/13/2019 09/06/2019 08/30/2019  Glucose 70 - 99 mg/dL 153(H) 167(H) 147(H)  BUN 8 - 23 mg/dL 18 14 19   Creatinine 0.44 - 1.00 mg/dL 0.61 0.59 0.69  Sodium 135 - 145 mmol/L 134(L) 133(L) 133(L)  Potassium 3.5 - 5.1 mmol/L 3.7 3.9 3.5  Chloride 98 - 111 mmol/L 97(L) 97(L) 98  CO2 22 - 32 mmol/L 25 25 26   Calcium 8.9 - 10.3 mg/dL 8.9 9.1 9.0  Total Protein 6.5 - 8.1 g/dL 7.0 7.2 7.0  Total Bilirubin 0.3 - 1.2 mg/dL 0.5 0.4 0.4  Alkaline Phos 38 - 126 U/L 140(H) 152(H) 143(H)  AST 15 - 41 U/L 29 20 28   ALT 0 - 44 U/L 28 23 28    Lab Results  Component Value Date   LDH 148 09/13/2019   LDH 142 08/09/2019   LDH 139 05/08/2019   Lab Results  Component Value Date   TOTALPROTELP 6.2 08/09/2019   ALBUMINELP 3.5 08/09/2019   A1GS 0.3 08/09/2019   A2GS 0.8 08/09/2019   BETS 1.1 08/09/2019   GAMS 0.5 08/09/2019   MSPIKE Not Observed 08/09/2019   SPEI Comment 08/09/2019    Lab Results  Component Value Date   KPAFRELGTCHN 31.9 (H) 08/09/2019   LAMBDASER 15.5 08/09/2019   KAPLAMBRATIO 2.06 (H) 08/09/2019    DIAGNOSTIC IMAGING:  I have independently reviewed the scans and discussed with the patient. No results found.   ASSESSMENT:  1.  IgA kappa plasma cell myeloma: -IgA kappa plasma cell myeloma diagnosed on right sacral bone biopsy on 05/17/2019. SPEP did not show M spike. Immunofixation shows IgA kappa. Kappa light chains 42.8, lambda light chains at 9.9, ratio 4.32. Beta-2 microglobulin 3.7. -PET scan on 06/04/2019 showed expansile lytic lesion involving the right sacral wing. Lytic lesion involving T10 vertebral body. Left seventh rib lesion. Increased uptake in a pathological fracture involving distal aspect of the right femur. 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 -RVD started on 06/26/2019. Revlimid 15 mg 2 weeks on/1 week off.   PLAN:   1.  IgA kappa plasma cell myeloma: -She is continuing to tolerate treatments very well. -She will finish her cycle of Revlimid on 09/19/2019 and start her next cycle on 09/27/2019. -She will proceed with her weekly Velcade today.  We have sent myeloma panel today which we will follow. -We will see her back in 4 weeks with labs.  2. Back pain: -She does not report any back pain at this time.  She is not requiring pain medication.  3. Normocytic anemia: -Hemoglobin today improved to 10.1.  Will consider checking ferritin and iron panel and give parenteral iron therapy if low.  4. ID prophylaxis: -Continue acyclovir twice daily for shingles prophylaxis. -Continue aspirin 81 mg for thromboprophylaxis.   Orders placed this encounter:  Orders Placed This Encounter  Procedures  . CBC with Differential  . Comprehensive metabolic panel  . Protein electrophoresis, serum  . Kappa/lambda light chains  . Lactate dehydrogenase  . Immunofixation electrophoresis     Derek Jack, MD [facility] [phone]   I, Milinda Antis, am acting as a scribe for Dr. Sanda Linger.  I, Derek Jack MD, have reviewed the above documentation for accuracy and completeness, and I agree with the above.

## 2019-09-14 LAB — KAPPA/LAMBDA LIGHT CHAINS
Kappa free light chain: 25.4 mg/L — ABNORMAL HIGH (ref 3.3–19.4)
Kappa, lambda light chain ratio: 1.9 — ABNORMAL HIGH (ref 0.26–1.65)
Lambda free light chains: 13.4 mg/L (ref 5.7–26.3)

## 2019-09-18 LAB — IMMUNOFIXATION ELECTROPHORESIS
IgA: 85 mg/dL (ref 64–422)
IgG (Immunoglobin G), Serum: 558 mg/dL — ABNORMAL LOW (ref 586–1602)
IgM (Immunoglobulin M), Srm: 39 mg/dL (ref 26–217)
Total Protein ELP: 6.2 g/dL (ref 6.0–8.5)

## 2019-09-18 LAB — PROTEIN ELECTROPHORESIS, SERUM
A/G Ratio: 1.3 (ref 0.7–1.7)
Albumin ELP: 3.5 g/dL (ref 2.9–4.4)
Alpha-1-Globulin: 0.3 g/dL (ref 0.0–0.4)
Alpha-2-Globulin: 0.9 g/dL (ref 0.4–1.0)
Beta Globulin: 1.1 g/dL (ref 0.7–1.3)
Gamma Globulin: 0.5 g/dL (ref 0.4–1.8)
Globulin, Total: 2.8 g/dL (ref 2.2–3.9)
Total Protein ELP: 6.3 g/dL (ref 6.0–8.5)

## 2019-09-20 ENCOUNTER — Other Ambulatory Visit (HOSPITAL_COMMUNITY): Payer: Self-pay | Admitting: *Deleted

## 2019-09-20 ENCOUNTER — Other Ambulatory Visit: Payer: Self-pay

## 2019-09-20 ENCOUNTER — Inpatient Hospital Stay (HOSPITAL_COMMUNITY): Payer: Medicare Other

## 2019-09-20 VITALS — BP 110/70 | HR 73 | Temp 98.0°F | Resp 18 | Wt 135.0 lb

## 2019-09-20 DIAGNOSIS — E538 Deficiency of other specified B group vitamins: Secondary | ICD-10-CM | POA: Diagnosis not present

## 2019-09-20 DIAGNOSIS — Z87891 Personal history of nicotine dependence: Secondary | ICD-10-CM | POA: Diagnosis not present

## 2019-09-20 DIAGNOSIS — C9000 Multiple myeloma not having achieved remission: Secondary | ICD-10-CM

## 2019-09-20 DIAGNOSIS — Z5112 Encounter for antineoplastic immunotherapy: Secondary | ICD-10-CM | POA: Diagnosis not present

## 2019-09-20 DIAGNOSIS — C9001 Multiple myeloma in remission: Secondary | ICD-10-CM | POA: Diagnosis not present

## 2019-09-20 LAB — CBC WITH DIFFERENTIAL/PLATELET
Abs Immature Granulocytes: 0.05 10*3/uL (ref 0.00–0.07)
Basophils Absolute: 0 10*3/uL (ref 0.0–0.1)
Basophils Relative: 1 %
Eosinophils Absolute: 0 10*3/uL (ref 0.0–0.5)
Eosinophils Relative: 0 %
HCT: 32.3 % — ABNORMAL LOW (ref 36.0–46.0)
Hemoglobin: 10.2 g/dL — ABNORMAL LOW (ref 12.0–15.0)
Immature Granulocytes: 1 %
Lymphocytes Relative: 9 %
Lymphs Abs: 0.5 10*3/uL — ABNORMAL LOW (ref 0.7–4.0)
MCH: 29.2 pg (ref 26.0–34.0)
MCHC: 31.6 g/dL (ref 30.0–36.0)
MCV: 92.6 fL (ref 80.0–100.0)
Monocytes Absolute: 0.1 10*3/uL (ref 0.1–1.0)
Monocytes Relative: 2 %
Neutro Abs: 4.3 10*3/uL (ref 1.7–7.7)
Neutrophils Relative %: 87 %
Platelets: 239 10*3/uL (ref 150–400)
RBC: 3.49 MIL/uL — ABNORMAL LOW (ref 3.87–5.11)
RDW: 16.6 % — ABNORMAL HIGH (ref 11.5–15.5)
WBC: 5 10*3/uL (ref 4.0–10.5)
nRBC: 0 % (ref 0.0–0.2)

## 2019-09-20 LAB — COMPREHENSIVE METABOLIC PANEL
ALT: 35 U/L (ref 0–44)
AST: 26 U/L (ref 15–41)
Albumin: 4 g/dL (ref 3.5–5.0)
Alkaline Phosphatase: 136 U/L — ABNORMAL HIGH (ref 38–126)
Anion gap: 10 (ref 5–15)
BUN: 15 mg/dL (ref 8–23)
CO2: 26 mmol/L (ref 22–32)
Calcium: 8.8 mg/dL — ABNORMAL LOW (ref 8.9–10.3)
Chloride: 98 mmol/L (ref 98–111)
Creatinine, Ser: 0.62 mg/dL (ref 0.44–1.00)
GFR calc Af Amer: >60 mL/min (ref >60)
GFR calc non Af Amer: >60 mL/min (ref >60)
Glucose, Bld: 141 mg/dL — ABNORMAL HIGH (ref 70–99)
Potassium: 3.8 mmol/L (ref 3.5–5.1)
Sodium: 134 mmol/L — ABNORMAL LOW (ref 135–145)
Total Bilirubin: 0.6 mg/dL (ref 0.3–1.2)
Total Protein: 6.9 g/dL (ref 6.5–8.1)

## 2019-09-20 MED ORDER — PROCHLORPERAZINE MALEATE 10 MG PO TABS
10.0000 mg | ORAL_TABLET | Freq: Once | ORAL | Status: AC
  Administered 2019-09-20: 10 mg via ORAL
  Filled 2019-09-20: qty 1

## 2019-09-20 MED ORDER — BORTEZOMIB CHEMO SQ INJECTION 3.5 MG (2.5MG/ML)
1.3000 mg/m2 | Freq: Once | INTRAMUSCULAR | Status: AC
  Administered 2019-09-20: 2.25 mg via SUBCUTANEOUS
  Filled 2019-09-20: qty 0.9

## 2019-09-20 NOTE — Patient Instructions (Signed)
[facility] Discharge Instructions for Patients Receiving Chemotherapy   Beginning January 23rd 2017 lab work for the Cancer Center will be done in the  Main lab at [facility] on 1st floor. If you have a lab appointment with the Cancer Center please come in thru the  Main Entrance and check in at the main information desk   Today you received the following chemotherapy agents Velcade  To help prevent nausea and vomiting after your treatment, we encourage you to take your nausea medication    If you develop nausea and vomiting, or diarrhea that is not controlled by your medication, call the clinic.  The clinic phone number is [phone]. Office hours are Monday-Friday 8:30am-5:00pm.  BELOW ARE SYMPTOMS THAT SHOULD BE REPORTED IMMEDIATELY:  *FEVER GREATER THAN 101.0 F  *CHILLS WITH OR WITHOUT FEVER  NAUSEA AND VOMITING THAT IS NOT CONTROLLED WITH YOUR NAUSEA MEDICATION  *UNUSUAL SHORTNESS OF BREATH  *UNUSUAL BRUISING OR BLEEDING  TENDERNESS IN MOUTH AND THROAT WITH OR WITHOUT PRESENCE OF ULCERS  *URINARY PROBLEMS  *BOWEL PROBLEMS  UNUSUAL RASH Items with * indicate a potential emergency and should be followed up as soon as possible. If you have an emergency after office hours please contact your primary care physician or go to the nearest emergency department.  Please call the clinic during office hours if you have any questions or concerns.   You may also contact the Patient Navigator at [phone] should you have any questions or need assistance in obtaining follow up care.      Resources For Cancer Patients and their Caregivers ? American Cancer Society: Can assist with transportation, wigs, general needs, runs Look Good Feel Better.        1-888-227-6333 ? Cancer Care: Provides financial assistance, online support groups, medication/co-pay assistance.  1-800-813-HOPE (4673) ? [facility] Assists [place] cancer  patients and their families through emotional , educational and financial support.  336-427-4357 ? [place] DSS Where to apply for food stamps, Medicaid and utility assistance. 336-342-1394 ? [facility]: Transportation to medical appointments. 336-347-2287 ? Social Security Administration: May apply for disability if have a Stage IV cancer. 336-342-7796 1-800-772-1213 ? [place] Aging, Disability and Transit Services: Assists with nutrition, care and transit needs. 336-349-2343          

## 2019-09-20 NOTE — Progress Notes (Signed)
Courtney Arnold presents today for [facility] Velcade injection. Pt denies any new changes or symptoms since last treatment. Lab results and vitals have been reviewed and are stable and within parameters for treatment.  Injection tolerated without incident or complaint. Discharged in satisfactory condition with follow up instructions.

## 2019-09-27 ENCOUNTER — Encounter (HOSPITAL_COMMUNITY): Payer: Self-pay

## 2019-09-27 ENCOUNTER — Inpatient Hospital Stay (HOSPITAL_COMMUNITY): Payer: Medicare Other

## 2019-09-27 ENCOUNTER — Other Ambulatory Visit: Payer: Self-pay

## 2019-09-27 VITALS — BP 106/55 | HR 76 | Temp 97.3°F | Resp 17

## 2019-09-27 DIAGNOSIS — Z5112 Encounter for antineoplastic immunotherapy: Secondary | ICD-10-CM | POA: Diagnosis not present

## 2019-09-27 DIAGNOSIS — Z87891 Personal history of nicotine dependence: Secondary | ICD-10-CM | POA: Diagnosis not present

## 2019-09-27 DIAGNOSIS — C9001 Multiple myeloma in remission: Secondary | ICD-10-CM | POA: Diagnosis not present

## 2019-09-27 DIAGNOSIS — C9000 Multiple myeloma not having achieved remission: Secondary | ICD-10-CM

## 2019-09-27 DIAGNOSIS — E538 Deficiency of other specified B group vitamins: Secondary | ICD-10-CM | POA: Diagnosis not present

## 2019-09-27 LAB — CBC WITH DIFFERENTIAL/PLATELET
Abs Immature Granulocytes: 0.03 10*3/uL (ref 0.00–0.07)
Basophils Absolute: 0 10*3/uL (ref 0.0–0.1)
Basophils Relative: 1 %
Eosinophils Absolute: 0 10*3/uL (ref 0.0–0.5)
Eosinophils Relative: 0 %
HCT: 32.8 % — ABNORMAL LOW (ref 36.0–46.0)
Hemoglobin: 10.4 g/dL — ABNORMAL LOW (ref 12.0–15.0)
Immature Granulocytes: 1 %
Lymphocytes Relative: 11 %
Lymphs Abs: 0.5 10*3/uL — ABNORMAL LOW (ref 0.7–4.0)
MCH: 29.5 pg (ref 26.0–34.0)
MCHC: 31.7 g/dL (ref 30.0–36.0)
MCV: 93.2 fL (ref 80.0–100.0)
Monocytes Absolute: 0.1 10*3/uL (ref 0.1–1.0)
Monocytes Relative: 3 %
Neutro Abs: 4 10*3/uL (ref 1.7–7.7)
Neutrophils Relative %: 84 %
Platelets: 237 10*3/uL (ref 150–400)
RBC: 3.52 MIL/uL — ABNORMAL LOW (ref 3.87–5.11)
RDW: 16.7 % — ABNORMAL HIGH (ref 11.5–15.5)
WBC: 4.7 10*3/uL (ref 4.0–10.5)
nRBC: 0 % (ref 0.0–0.2)

## 2019-09-27 LAB — COMPREHENSIVE METABOLIC PANEL
ALT: 23 U/L (ref 0–44)
AST: 20 U/L (ref 15–41)
Albumin: 3.9 g/dL (ref 3.5–5.0)
Alkaline Phosphatase: 138 U/L — ABNORMAL HIGH (ref 38–126)
Anion gap: 11 (ref 5–15)
BUN: 19 mg/dL (ref 8–23)
CO2: 26 mmol/L (ref 22–32)
Calcium: 9.2 mg/dL (ref 8.9–10.3)
Chloride: 98 mmol/L (ref 98–111)
Creatinine, Ser: 0.59 mg/dL (ref 0.44–1.00)
GFR calc Af Amer: >60 mL/min (ref >60)
GFR calc non Af Amer: >60 mL/min (ref >60)
Glucose, Bld: 150 mg/dL — ABNORMAL HIGH (ref 70–99)
Potassium: 3.9 mmol/L (ref 3.5–5.1)
Sodium: 135 mmol/L (ref 135–145)
Total Bilirubin: 0.4 mg/dL (ref 0.3–1.2)
Total Protein: 7.2 g/dL (ref 6.5–8.1)

## 2019-09-27 MED ORDER — BORTEZOMIB CHEMO SQ INJECTION 3.5 MG (2.5MG/ML)
1.3000 mg/m2 | Freq: Once | INTRAMUSCULAR | Status: AC
  Administered 2019-09-27: 2.25 mg via SUBCUTANEOUS
  Filled 2019-09-27: qty 0.9

## 2019-09-27 MED ORDER — PROCHLORPERAZINE MALEATE 10 MG PO TABS
10.0000 mg | ORAL_TABLET | Freq: Once | ORAL | Status: AC
  Administered 2019-09-27: 10 mg via ORAL
  Filled 2019-09-27: qty 1

## 2019-09-27 NOTE — Progress Notes (Signed)
Patient tolerated Velcade injection with no complaints voiced.  Lab work reviewed.  See MAR for details.  Injection site clean and dry with no bruising or swelling noted.  Band aid applied.  VSS.  Patient left in satisfactory condition with no s/s of distress noted.  

## 2019-10-04 ENCOUNTER — Other Ambulatory Visit: Payer: Self-pay

## 2019-10-04 ENCOUNTER — Inpatient Hospital Stay (HOSPITAL_COMMUNITY): Payer: Medicare Other

## 2019-10-04 ENCOUNTER — Encounter (HOSPITAL_COMMUNITY): Payer: Self-pay

## 2019-10-04 ENCOUNTER — Other Ambulatory Visit (HOSPITAL_COMMUNITY): Payer: Self-pay | Admitting: *Deleted

## 2019-10-04 VITALS — BP 113/70 | HR 73 | Temp 96.9°F | Resp 16

## 2019-10-04 DIAGNOSIS — Z5112 Encounter for antineoplastic immunotherapy: Secondary | ICD-10-CM | POA: Diagnosis not present

## 2019-10-04 DIAGNOSIS — E538 Deficiency of other specified B group vitamins: Secondary | ICD-10-CM | POA: Diagnosis not present

## 2019-10-04 DIAGNOSIS — C9001 Multiple myeloma in remission: Secondary | ICD-10-CM | POA: Diagnosis not present

## 2019-10-04 DIAGNOSIS — C9000 Multiple myeloma not having achieved remission: Secondary | ICD-10-CM

## 2019-10-04 DIAGNOSIS — Z87891 Personal history of nicotine dependence: Secondary | ICD-10-CM | POA: Diagnosis not present

## 2019-10-04 LAB — COMPREHENSIVE METABOLIC PANEL
ALT: 37 U/L (ref 0–44)
AST: 37 U/L (ref 15–41)
Albumin: 4 g/dL (ref 3.5–5.0)
Alkaline Phosphatase: 145 U/L — ABNORMAL HIGH (ref 38–126)
Anion gap: 12 (ref 5–15)
BUN: 18 mg/dL (ref 8–23)
CO2: 24 mmol/L (ref 22–32)
Calcium: 8.9 mg/dL (ref 8.9–10.3)
Chloride: 96 mmol/L — ABNORMAL LOW (ref 98–111)
Creatinine, Ser: 0.69 mg/dL (ref 0.44–1.00)
GFR calc Af Amer: >60 mL/min (ref >60)
GFR calc non Af Amer: >60 mL/min (ref >60)
Glucose, Bld: 150 mg/dL — ABNORMAL HIGH (ref 70–99)
Potassium: 3.9 mmol/L (ref 3.5–5.1)
Sodium: 132 mmol/L — ABNORMAL LOW (ref 135–145)
Total Bilirubin: 0.5 mg/dL (ref 0.3–1.2)
Total Protein: 7.1 g/dL (ref 6.5–8.1)

## 2019-10-04 LAB — IRON AND TIBC
Iron: 43 ug/dL (ref 28–170)
Saturation Ratios: 9 % — ABNORMAL LOW (ref 10.4–31.8)
TIBC: 471 ug/dL — ABNORMAL HIGH (ref 250–450)
UIBC: 428 ug/dL

## 2019-10-04 LAB — CBC WITH DIFFERENTIAL/PLATELET
Abs Immature Granulocytes: 0.08 10*3/uL — ABNORMAL HIGH (ref 0.00–0.07)
Basophils Absolute: 0 10*3/uL (ref 0.0–0.1)
Basophils Relative: 1 %
Eosinophils Absolute: 0 10*3/uL (ref 0.0–0.5)
Eosinophils Relative: 1 %
HCT: 33 % — ABNORMAL LOW (ref 36.0–46.0)
Hemoglobin: 10.3 g/dL — ABNORMAL LOW (ref 12.0–15.0)
Immature Granulocytes: 1 %
Lymphocytes Relative: 8 %
Lymphs Abs: 0.5 10*3/uL — ABNORMAL LOW (ref 0.7–4.0)
MCH: 29.1 pg (ref 26.0–34.0)
MCHC: 31.2 g/dL (ref 30.0–36.0)
MCV: 93.2 fL (ref 80.0–100.0)
Monocytes Absolute: 0.1 10*3/uL (ref 0.1–1.0)
Monocytes Relative: 1 %
Neutro Abs: 5.2 10*3/uL (ref 1.7–7.7)
Neutrophils Relative %: 88 %
Platelets: 278 10*3/uL (ref 150–400)
RBC: 3.54 MIL/uL — ABNORMAL LOW (ref 3.87–5.11)
RDW: 16.8 % — ABNORMAL HIGH (ref 11.5–15.5)
WBC: 5.9 10*3/uL (ref 4.0–10.5)
nRBC: 0 % (ref 0.0–0.2)

## 2019-10-04 LAB — FERRITIN: Ferritin: 59 ng/mL (ref 11–307)

## 2019-10-04 LAB — LACTATE DEHYDROGENASE: LDH: 162 U/L (ref 98–192)

## 2019-10-04 MED ORDER — PROCHLORPERAZINE MALEATE 10 MG PO TABS
10.0000 mg | ORAL_TABLET | Freq: Once | ORAL | Status: AC
  Administered 2019-10-04: 10 mg via ORAL

## 2019-10-04 MED ORDER — BORTEZOMIB CHEMO SQ INJECTION 3.5 MG (2.5MG/ML)
1.3000 mg/m2 | Freq: Once | INTRAMUSCULAR | Status: AC
  Administered 2019-10-04: 2.25 mg via SUBCUTANEOUS
  Filled 2019-10-04: qty 0.9

## 2019-10-04 MED ORDER — PROCHLORPERAZINE MALEATE 10 MG PO TABS
ORAL_TABLET | ORAL | Status: AC
  Filled 2019-10-04: qty 1

## 2019-10-04 MED ORDER — LENALIDOMIDE 15 MG PO CAPS: ORAL_CAPSULE | ORAL | 0 refills | Status: DC

## 2019-10-04 NOTE — Telephone Encounter (Signed)
Chart reviewed, per last office note, revlimid refilled.

## 2019-10-04 NOTE — Patient Instructions (Signed)
[facility] Discharge Instructions for Patients Receiving Chemotherapy  Today you received the following chemotherapy agents   To help prevent nausea and vomiting after your treatment, we encourage you to take your nausea medication   If you develop nausea and vomiting that is not controlled by your nausea medication, call the clinic.   BELOW ARE SYMPTOMS THAT SHOULD BE REPORTED IMMEDIATELY:  *FEVER GREATER THAN 100.5 F  *CHILLS WITH OR WITHOUT FEVER  NAUSEA AND VOMITING THAT IS NOT CONTROLLED WITH YOUR NAUSEA MEDICATION  *UNUSUAL SHORTNESS OF BREATH  *UNUSUAL BRUISING OR BLEEDING  TENDERNESS IN MOUTH AND THROAT WITH OR WITHOUT PRESENCE OF ULCERS  *URINARY PROBLEMS  *BOWEL PROBLEMS  UNUSUAL RASH Items with * indicate a potential emergency and should be followed up as soon as possible.  Feel free to call the clinic should you have any questions or concerns. The clinic phone number is [phone].  Please show the CHEMO ALERT CARD at check-in to the Emergency Department and triage nurse.   

## 2019-10-04 NOTE — Progress Notes (Signed)
Patient presents today for Velcade injection. Labs within parameters for treatment. Vital sings are within parameters for treatment. Patient denies pain today. Patient states she is taking Revlimid 15 mg PO as prescribed with no side effects.   Velcade given today per MD orders. Tolerated without adverse affects. Vital signs stable. No complaints at this time. Discharged from clinic via wheel chair. F/U with [facility] as scheduled.

## 2019-10-05 LAB — KAPPA/LAMBDA LIGHT CHAINS
Kappa free light chain: 30 mg/L — ABNORMAL HIGH (ref 3.3–19.4)
Kappa, lambda light chain ratio: 1.99 — ABNORMAL HIGH (ref 0.26–1.65)
Lambda free light chains: 15.1 mg/L (ref 5.7–26.3)

## 2019-10-05 LAB — IMMUNOFIXATION ELECTROPHORESIS
IgA: 99 mg/dL (ref 64–422)
IgG (Immunoglobin G), Serum: 582 mg/dL — ABNORMAL LOW (ref 586–1602)
IgM (Immunoglobulin M), Srm: 43 mg/dL (ref 26–217)
Total Protein ELP: 6.5 g/dL (ref 6.0–8.5)

## 2019-10-05 LAB — PROTEIN ELECTROPHORESIS, SERUM
A/G Ratio: 1.2 (ref 0.7–1.7)
Albumin ELP: 3.4 g/dL (ref 2.9–4.4)
Alpha-1-Globulin: 0.3 g/dL (ref 0.0–0.4)
Alpha-2-Globulin: 0.9 g/dL (ref 0.4–1.0)
Beta Globulin: 1.1 g/dL (ref 0.7–1.3)
Gamma Globulin: 0.6 g/dL (ref 0.4–1.8)
Globulin, Total: 2.9 g/dL (ref 2.2–3.9)
Total Protein ELP: 6.3 g/dL (ref 6.0–8.5)

## 2019-10-11 ENCOUNTER — Other Ambulatory Visit: Payer: Self-pay

## 2019-10-11 ENCOUNTER — Inpatient Hospital Stay (HOSPITAL_COMMUNITY): Payer: Medicare Other

## 2019-10-11 ENCOUNTER — Inpatient Hospital Stay (HOSPITAL_COMMUNITY): Payer: Medicare Other | Attending: Hematology | Admitting: Hematology

## 2019-10-11 VITALS — BP 100/47 | HR 66 | Temp 97.6°F | Resp 18 | Wt 134.9 lb

## 2019-10-11 DIAGNOSIS — C9000 Multiple myeloma not having achieved remission: Secondary | ICD-10-CM | POA: Diagnosis not present

## 2019-10-11 DIAGNOSIS — Z5112 Encounter for antineoplastic immunotherapy: Secondary | ICD-10-CM | POA: Insufficient documentation

## 2019-10-11 DIAGNOSIS — Z79899 Other long term (current) drug therapy: Secondary | ICD-10-CM | POA: Diagnosis not present

## 2019-10-11 LAB — COMPREHENSIVE METABOLIC PANEL
ALT: 21 U/L (ref 0–44)
AST: 20 U/L (ref 15–41)
Albumin: 3.9 g/dL (ref 3.5–5.0)
Alkaline Phosphatase: 119 U/L (ref 38–126)
Anion gap: 10 (ref 5–15)
BUN: 25 mg/dL — ABNORMAL HIGH (ref 8–23)
CO2: 26 mmol/L (ref 22–32)
Calcium: 8.8 mg/dL — ABNORMAL LOW (ref 8.9–10.3)
Chloride: 96 mmol/L — ABNORMAL LOW (ref 98–111)
Creatinine, Ser: 0.72 mg/dL (ref 0.44–1.00)
GFR calc Af Amer: >60 mL/min (ref >60)
GFR calc non Af Amer: >60 mL/min (ref >60)
Glucose, Bld: 152 mg/dL — ABNORMAL HIGH (ref 70–99)
Potassium: 4.2 mmol/L (ref 3.5–5.1)
Sodium: 132 mmol/L — ABNORMAL LOW (ref 135–145)
Total Bilirubin: 0.6 mg/dL (ref 0.3–1.2)
Total Protein: 7 g/dL (ref 6.5–8.1)

## 2019-10-11 LAB — CBC WITH DIFFERENTIAL/PLATELET
Abs Immature Granulocytes: 0.03 10*3/uL (ref 0.00–0.07)
Basophils Absolute: 0 10*3/uL (ref 0.0–0.1)
Basophils Relative: 0 %
Eosinophils Absolute: 0 10*3/uL (ref 0.0–0.5)
Eosinophils Relative: 0 %
HCT: 31.7 % — ABNORMAL LOW (ref 36.0–46.0)
Hemoglobin: 10 g/dL — ABNORMAL LOW (ref 12.0–15.0)
Immature Granulocytes: 1 %
Lymphocytes Relative: 10 %
Lymphs Abs: 0.5 10*3/uL — ABNORMAL LOW (ref 0.7–4.0)
MCH: 29.2 pg (ref 26.0–34.0)
MCHC: 31.5 g/dL (ref 30.0–36.0)
MCV: 92.7 fL (ref 80.0–100.0)
Monocytes Absolute: 0.1 10*3/uL (ref 0.1–1.0)
Monocytes Relative: 2 %
Neutro Abs: 3.9 10*3/uL (ref 1.7–7.7)
Neutrophils Relative %: 87 %
Platelets: 172 10*3/uL (ref 150–400)
RBC: 3.42 MIL/uL — ABNORMAL LOW (ref 3.87–5.11)
RDW: 16.7 % — ABNORMAL HIGH (ref 11.5–15.5)
WBC: 4.6 10*3/uL (ref 4.0–10.5)
nRBC: 0 % (ref 0.0–0.2)

## 2019-10-11 MED ORDER — PROCHLORPERAZINE MALEATE 10 MG PO TABS
ORAL_TABLET | ORAL | Status: AC
  Filled 2019-10-11: qty 1

## 2019-10-11 MED ORDER — BORTEZOMIB CHEMO SQ INJECTION 3.5 MG (2.5MG/ML)
1.3000 mg/m2 | Freq: Once | INTRAMUSCULAR | Status: AC
  Administered 2019-10-11: 2.25 mg via SUBCUTANEOUS
  Filled 2019-10-11: qty 0.9

## 2019-10-11 MED ORDER — PROCHLORPERAZINE MALEATE 10 MG PO TABS
10.0000 mg | ORAL_TABLET | Freq: Once | ORAL | Status: AC
  Administered 2019-10-11: 10 mg via ORAL

## 2019-10-11 NOTE — Progress Notes (Signed)
Labs reviewed today, will proceed with treatment per MD.   Patient is taking revlimid and has not missed any doses and reports no side effects at this time.   Treatment given per orders. Patient tolerated it well without problems. Vitals stable and discharged home from clinic via wheelchair. Follow up as scheduled.

## 2019-10-11 NOTE — Progress Notes (Signed)
Courtney Arnold, Courtney Arnold [address]   CLINIC:  Medical Oncology/Hematology  PCP:  Courtney Arnold, [place] / [place] [address] [phone]   REASON FOR VISIT:  Follow-up for IgA kappa plasma cell myeloma  PRIOR THERAPY: None  NGS Results: Not done  CURRENT THERAPY: Bortezomib and Revlimid  BRIEF ONCOLOGIC HISTORY:  Oncology History  Multiple myeloma not having achieved remission ([place])  06/07/2019 Initial Diagnosis   Multiple myeloma not having achieved remission ([place])   07/04/2019 -  Chemotherapy   The patient had dexamethasone (DECADRON) 4 MG tablet, 1 of 1 cycle, Start date: 06/21/2019, End date: -- lenalidomide (REVLIMID) 15 MG capsule, 1 of 1 cycle, Start date: 10/04/2019, End date: -- bortezomib SQ (VELCADE) chemo injection 2.25 mg, 1.3 mg/m2 = 2.25 mg, Subcutaneous,  Once, 5 of 8 cycles Administration: 2.25 mg (07/04/2019), 2.25 mg (07/11/2019), 2.25 mg (07/26/2019), 2.25 mg (08/02/2019), 2.25 mg (08/09/2019), 2.25 mg (08/16/2019), 2.25 mg (08/23/2019), 2.25 mg (08/30/2019), 2.25 mg (09/06/2019), 2.25 mg (09/13/2019), 2.25 mg (09/20/2019), 2.25 mg (09/27/2019), 2.25 mg (10/04/2019)  for chemotherapy treatment.      CANCER STAGING: Cancer Staging No matching staging information was found for the patient.  INTERVAL HISTORY:  [name], a 79 y.o. female, returns for routine follow-up and consideration for next cycle of chemotherapy. Courtney Arnold was last seen on 09/13/2019.  Due for day #8 of cycle #5 of bortezomib today.   Today she is accompanied by her daughter. Overall, she tells me she has been feeling pretty well. She is tolerating the bortezomib and Revlimid well; her next cycle of Revlimid will start on 8/12. Her appetite and energy levels are excellent. She denies having any N/V/D, nor numbness or tingling. She broke her right leg and is currently limping when she walks.  Overall, she feels ready for next cycle of chemo  today.    REVIEW OF SYSTEMS:  Review of Systems  Constitutional: Negative for appetite change and fatigue.  Gastrointestinal: Negative for diarrhea, nausea and vomiting.  Musculoskeletal: Negative for arthralgias and myalgias.  Neurological: Negative for numbness.  All other systems reviewed and are negative.   PAST MEDICAL/SURGICAL HISTORY:  Past Medical History:  Diagnosis Date   Atrial fibrillation ([place]) 2011   Postop, spontaneous conversion to normal sinus after one hour   Compression fracture 07/24/09   T12; kyphoplasty   History of echocardiogram 5/11   EF 65%   Hypertension    Thyroid disease    Tobacco abuse    Past Surgical History:  Procedure Laterality Date   BACK SURGERY     BACK SURGERY  06/06/2015   BREAST EXCISIONAL BIOPSY Left    50 years ago  benign   CHOLECYSTECTOMY N/A 04/25/2015   Procedure: LAPAROSCOPIC CHOLECYSTECTOMY WITH INTRAOPERATIVE CHOLANGIOGRAM;  Surgeon: Mickeal Skinner, MD;  Location: [facility];  Service: General;  Laterality: N/A;   COLONOSCOPY N/A 11/26/2015   Procedure: COLONOSCOPY;  Surgeon: Daneil Dolin, MD;  Location[facility];  Service: Endoscopy;  Laterality: N/A;  7:30 am   ERCP N/A 04/14/2015   Procedure: ENDOSCOPIC RETROGRADE CHOLANGIOPANCREATOGRAPHY (ERCP) Biliary Sphincterotomy, 10x7 stent placement Dilated bilary system just not well seen;  Surgeon: Rogene Houston, MD;  Location: [facility];  Service: Endoscopy;  Laterality: N/A;   ERCP N/A 06/12/2015   Procedure: ENDOSCOPIC RETROGRADE CHOLANGIOPANCREATOGRAPHY (ERCP);  Surgeon: Rogene Houston, MD;  [facility];  Service: Endoscopy;  Laterality: N/A;   ESOPHAGOGASTRODUODENOSCOPY N/A  06/12/2015   Procedure: DIAGNOSTIC ESOPHAGOGASTRODUODENOSCOPY (EGD);  Surgeon: Rogene Houston, MD;  [facility];  Service: Endoscopy;  Laterality: N/A;   FEMUR IM NAIL Right 05/11/2019   Procedure: RETROGRADE INTRAMEDULLARY NAIL FEMORAL;  Surgeon: Shona Needles, MD;   Location: [facility];  Service: Orthopedics;  Laterality: Right;   STENT REMOVAL  06/12/2015   Procedure: STENT REMOVAL ;  Surgeon: Rogene Houston, MD;  [facility];  Service: Endoscopy;;    SOCIAL HISTORY:  Social History   Socioeconomic History   Marital status: Widowed    Spouse name: Not on file   Number of children: 2   Years of education: Not on file   Highest education level: Not on file  Occupational History   Occupation: retired  Tobacco Use   Smoking status: Former Smoker    Packs/day: 0.15    Years: 20.00    Pack years: 3.00    Quit date: 03/08/2013    Years since quitting: 6.5   Smokeless tobacco: Never Used  Vaping Use   Vaping Use: Never used  Substance and Sexual Activity   Alcohol use: No    Alcohol/week: 0.0 standard drinks   Drug use: No   Sexual activity: Not Currently  Other Topics Concern   Not on file  Social History Narrative   Active in gardens and does yard work.   Social Determinants of Health   Financial Resource Strain: Low Risk    Difficulty of Paying Living Expenses: Not hard at all  Food Insecurity: No Food Insecurity   Worried About Charity fundraiser in the Last Year: Never true   [place] in the Last Year: Never true  Transportation Needs: No Transportation Needs   Lack of Transportation (Medical): No   Lack of Transportation (Non-Medical): No  Physical Activity: Inactive   Days of Exercise per Week: 0 days   Minutes of Exercise per Session: 0 min  Stress: No Stress Concern Present   Feeling of Stress : Not at all  Social Connections:    Frequency of Communication with Friends and Family:    Frequency of Social Gatherings with Friends and Family:    Attends Religious Services:    Active Member of Clubs or Organizations:    Attends Music therapist:    Marital Status:   Intimate Partner Violence: Not At Risk   Fear of Current or Ex-Partner: No   Emotionally Abused: No    Physically Abused: No   Sexually Abused: No    FAMILY HISTORY:  Family History  Problem Relation Age of Onset   Heart attack Father    Stroke Father    Breast cancer Sister    Thyroid disease Neg Hx    Colon cancer Neg Hx     CURRENT MEDICATIONS:  Current Outpatient Medications  Medication Sig Dispense Refill   acyclovir (ZOVIRAX) 400 MG tablet Take 1 tablet (400 mg total) by mouth 2 (two) times daily. 60 tablet 3   aspirin EC 81 MG tablet Take 81 mg by mouth daily.     bortezomib IV (VELCADE) 3.5 MG injection Inject 1.3 mg/m2 into the vein once a week. Day 1, 8, 15 every 21 days     carboxymethylcellulose (REFRESH PLUS) 0.5 % SOLN Place 1 drop into both eyes in the morning, at noon, in the evening, and at bedtime.     Cholecalciferol (VITAMIN D) 125 MCG (5000 UT) CAPS Take 5,000 Units by mouth  daily. 90 capsule 1   cyclobenzaprine (FLEXERIL) 5 MG tablet Take 1 tablet (5 mg total) by mouth 3 (three) times daily as needed for muscle spasms. 30 tablet 0   dexamethasone (DECADRON) 4 MG tablet Take 5 tablets (20 mg) on days 1, 8, and 15 of chemo. Repeat every 21 days. 30 tablet 3   lenalidomide (REVLIMID) 15 MG capsule Take one capsule daily on days 1-14 every 21 days. 14 capsule 0   levothyroxine (SYNTHROID) 100 MCG tablet Take 1 tablet (100 mcg total) by mouth daily. 30 tablet 0   NON FORMULARY Diet -Regular     prochlorperazine (COMPAZINE) 10 MG tablet Take 1 tablet (10 mg total) by mouth every 6 (six) hours as needed (Nausea or vomiting). 30 tablet 1   No current facility-administered medications for this visit.    ALLERGIES:  No Known Allergies  PHYSICAL EXAM:  Performance status (ECOG): 1 - Symptomatic but completely ambulatory  There were no vitals filed for this visit. Wt Readings from Last 3 Encounters:  09/20/19 135 lb (61.2 kg)  09/13/19 135 lb 1.6 oz (61.3 kg)  08/16/19 133 lb 1.6 oz (60.4 kg)   Physical Exam Vitals reviewed.  Constitutional:       Appearance: Normal appearance.  Cardiovascular:     Rate and Rhythm: Normal rate and regular rhythm.     Pulses: Normal pulses.     Heart sounds: Normal heart sounds.  Pulmonary:     Effort: Pulmonary effort is normal.     Breath sounds: Normal breath sounds.  Musculoskeletal:     Right lower leg: No edema.     Left lower leg: No edema.  Neurological:     General: No focal deficit present.     Mental Status: She is alert and oriented to person, place, and time.     Gait: Gait abnormal.  Psychiatric:        Mood and Affect: Mood normal.        Behavior: Behavior normal.     LABORATORY DATA:  I have reviewed the labs as listed.  CBC Latest Ref Rng & Units 10/11/2019 10/04/2019 09/27/2019  WBC 4.0 - 10.5 K/uL 4.6 5.9 4.7  Hemoglobin 12.0 - 15.0 g/dL 10.0(L) 10.3(L) 10.4(L)  Hematocrit 36 - 46 % 31.7(L) 33.0(L) 32.8(L)  Platelets 150 - 400 K/uL 172 278 237   CMP Latest Ref Rng & Units 10/11/2019 10/04/2019 09/27/2019  Glucose 70 - 99 mg/dL 152(H) 150(H) 150(H)  BUN 8 - 23 mg/dL 25(H) 18 19  Creatinine 0.44 - 1.00 mg/dL 0.72 0.69 0.59  Sodium 135 - 145 mmol/L 132(L) 132(L) 135  Potassium 3.5 - 5.1 mmol/L 4.2 3.9 3.9  Chloride 98 - 111 mmol/L 96(L) 96(L) 98  CO2 22 - 32 mmol/L 26 24 26   Calcium 8.9 - 10.3 mg/dL 8.8(L) 8.9 9.2  Total Protein 6.5 - 8.1 g/dL 7.0 7.1 7.2  Total Bilirubin 0.3 - 1.2 mg/dL 0.6 0.5 0.4  Alkaline Phos 38 - 126 U/L 119 145(H) 138(H)  AST 15 - 41 U/L 20 37 20  ALT 0 - 44 U/L 21 37 23   Lab Results  Component Value Date   LDH 162 10/04/2019   LDH 148 09/13/2019   LDH 142 08/09/2019   Lab Results  Component Value Date   TIBC 471 (H) 10/04/2019   TIBC 349 05/08/2019   TIBC 227 (L) 04/15/2015   FERRITIN 59 10/04/2019   FERRITIN 64 05/08/2019   FERRITIN 371 (H) 04/15/2015  IRONPCTSAT 9 (L) 10/04/2019   IRONPCTSAT 9 (L) 05/08/2019   IRONPCTSAT 12 04/15/2015   Lab Results  Component Value Date   TOTALPROTELP 6.3 10/04/2019   TOTALPROTELP 6.5  10/04/2019   ALBUMINELP 3.4 10/04/2019   A1GS 0.3 10/04/2019   A2GS 0.9 10/04/2019   BETS 1.1 10/04/2019   GAMS 0.6 10/04/2019   MSPIKE Not Observed 10/04/2019   SPEI Comment 10/04/2019    Lab Results  Component Value Date   KPAFRELGTCHN 30.0 (H) 10/04/2019   LAMBDASER 15.1 10/04/2019   KAPLAMBRATIO 1.99 (H) 10/04/2019    DIAGNOSTIC IMAGING:  I have independently reviewed the scans and discussed with the patient. No results found.   ASSESSMENT:  1. IgA kappa plasma cell myeloma: -IgA kappa plasma cell myeloma diagnosed on right sacral bone biopsy on 05/17/2019. SPEP did not show M spike. Immunofixation shows IgA kappa. Kappa light chains 42.8, lambda light chains at 9.9, ratio 4.32. Beta-2 microglobulin 3.7. -PET scan on 06/04/2019 showed expansile lytic lesion involving the right sacral wing. Lytic lesion involving T10 vertebral body. Left seventh rib lesion. Increased uptake in a pathological fracture involving distal aspect of the right femur. 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 -RVD started on 06/26/2019. Revlimid 15 mg 2 weeks on/1 week off.   PLAN:  1. IgA kappa plasma cell myeloma: -She finished Revlimid last night.  She will started back next week.  Reviewed labs from 10/04/2019.  M spike is negative. -Kappa light chains of 30 and ratio of 1.99.  Lambda light chain is 15.1.  Immunofixation is negative. -We will continue weekly Velcade along with Revlimid.  Continue dexamethasone 20 mg weekly. -We will reevaluate her in 1 month.  2. Back pain: -She does not require any pain medication has back pain improved with treatment.  3. Normocytic anemia: -Hemoglobin is 10.  Ferritin was 59 and percent saturation 9 on 10/04/2019. -If hemoglobin drops below 10, will consider Feraheme.  4. ID prophylaxis: -Continue acyclovir twice daily.  Continue aspirin 81 mg for thromboprophylaxis.  5.   Myeloma bone disease: -We talked about starting her on denosumab.  We discussed about the side effects in detail. -She was told to take calcium twice daily. -We will likely initiate denosumab at next week.   Orders placed this encounter:  No orders of the defined types were placed in this encounter.    Derek Jack, MD [place] [phone]   I, Milinda Antis, am acting as a scribe for Dr. Sanda Linger.  I, Derek Jack MD, have reviewed the above documentation for accuracy and completeness, and I agree with the above.

## 2019-10-11 NOTE — Patient Instructions (Signed)
[facility] Discharge Instructions for Patients Receiving Chemotherapy  Today you received the following chemotherapy agents   To help prevent nausea and vomiting after your treatment, we encourage you to take your nausea medication   If you develop nausea and vomiting that is not controlled by your nausea medication, call the clinic.   BELOW ARE SYMPTOMS THAT SHOULD BE REPORTED IMMEDIATELY:  *FEVER GREATER THAN 100.5 F  *CHILLS WITH OR WITHOUT FEVER  NAUSEA AND VOMITING THAT IS NOT CONTROLLED WITH YOUR NAUSEA MEDICATION  *UNUSUAL SHORTNESS OF BREATH  *UNUSUAL BRUISING OR BLEEDING  TENDERNESS IN MOUTH AND THROAT WITH OR WITHOUT PRESENCE OF ULCERS  *URINARY PROBLEMS  *BOWEL PROBLEMS  UNUSUAL RASH Items with * indicate a potential emergency and should be followed up as soon as possible.  Feel free to call the clinic should you have any questions or concerns. The clinic phone number is [phone].  Please show the CHEMO ALERT CARD at check-in to the Emergency Department and triage nurse.   

## 2019-10-11 NOTE — Patient Instructions (Signed)
[place] at [facility] Discharge Instructions  You were seen today by Dr. Delton Coombes. He went over your recent results. You received your injection today; continue getting your injections every week. Buy calcium 500 mg over the counter and start taking one tablet twice daily. Dr. Delton Coombes will see you back in 4 weeks for labs and follow up.   Thank you for choosing [place] at [facility] to provide your oncology and hematology care.  To afford each patient quality time with our provider, please arrive at least 15 minutes before your scheduled appointment time.   If you have a lab appointment with the [facility] please come in thru the Main Entrance and check in at the main information desk  You need to re-schedule your appointment should you arrive 10 or more minutes late.  We strive to give you quality time with our providers, and arriving late affects you and other patients whose appointments are after yours.  Also, if you no show three or more times for appointments you may be dismissed from the clinic at the providers discretion.     Again, thank you for choosing [facility].  Our hope is that these requests will decrease the amount of time that you wait before being seen by our physicians.       _____________________________________________________________  Should you have questions after your visit to [facility], please contact our office at [phone] between the hours of 8:00 a.m. and 4:30 p.m.  Voicemails left after 4:00 p.m. will not be returned until the following business day.  For prescription refill requests, have your pharmacy contact our office and allow 72 hours.    Cancer Center Support Programs:   > Cancer Support Group  2nd Tuesday of the month 1pm-2pm, Journey Room

## 2019-10-17 ENCOUNTER — Other Ambulatory Visit (HOSPITAL_COMMUNITY): Payer: Self-pay | Admitting: Hematology

## 2019-10-17 DIAGNOSIS — C9000 Multiple myeloma not having achieved remission: Secondary | ICD-10-CM

## 2019-10-18 ENCOUNTER — Inpatient Hospital Stay (HOSPITAL_COMMUNITY): Payer: Medicare Other

## 2019-10-18 ENCOUNTER — Other Ambulatory Visit: Payer: Self-pay

## 2019-10-18 ENCOUNTER — Encounter (HOSPITAL_COMMUNITY): Payer: Self-pay

## 2019-10-18 VITALS — BP 117/70 | HR 83 | Resp 18

## 2019-10-18 DIAGNOSIS — Z5112 Encounter for antineoplastic immunotherapy: Secondary | ICD-10-CM | POA: Diagnosis not present

## 2019-10-18 DIAGNOSIS — Z79899 Other long term (current) drug therapy: Secondary | ICD-10-CM | POA: Diagnosis not present

## 2019-10-18 DIAGNOSIS — C9000 Multiple myeloma not having achieved remission: Secondary | ICD-10-CM

## 2019-10-18 LAB — COMPREHENSIVE METABOLIC PANEL
ALT: 28 U/L (ref 0–44)
AST: 24 U/L (ref 15–41)
Albumin: 3.9 g/dL (ref 3.5–5.0)
Alkaline Phosphatase: 127 U/L — ABNORMAL HIGH (ref 38–126)
Anion gap: 14 (ref 5–15)
BUN: 17 mg/dL (ref 8–23)
CO2: 26 mmol/L (ref 22–32)
Calcium: 9.3 mg/dL (ref 8.9–10.3)
Chloride: 94 mmol/L — ABNORMAL LOW (ref 98–111)
Creatinine, Ser: 0.73 mg/dL (ref 0.44–1.00)
GFR calc Af Amer: >60 mL/min (ref >60)
GFR calc non Af Amer: >60 mL/min (ref >60)
Glucose, Bld: 154 mg/dL — ABNORMAL HIGH (ref 70–99)
Potassium: 4.1 mmol/L (ref 3.5–5.1)
Sodium: 134 mmol/L — ABNORMAL LOW (ref 135–145)
Total Bilirubin: 0.5 mg/dL (ref 0.3–1.2)
Total Protein: 7.5 g/dL (ref 6.5–8.1)

## 2019-10-18 LAB — CBC WITH DIFFERENTIAL/PLATELET
Abs Immature Granulocytes: 0.06 10*3/uL (ref 0.00–0.07)
Basophils Absolute: 0 10*3/uL (ref 0.0–0.1)
Basophils Relative: 1 %
Eosinophils Absolute: 0 10*3/uL (ref 0.0–0.5)
Eosinophils Relative: 0 %
HCT: 32.6 % — ABNORMAL LOW (ref 36.0–46.0)
Hemoglobin: 10.7 g/dL — ABNORMAL LOW (ref 12.0–15.0)
Immature Granulocytes: 1 %
Lymphocytes Relative: 11 %
Lymphs Abs: 0.5 10*3/uL — ABNORMAL LOW (ref 0.7–4.0)
MCH: 29.8 pg (ref 26.0–34.0)
MCHC: 32.8 g/dL (ref 30.0–36.0)
MCV: 90.8 fL (ref 80.0–100.0)
Monocytes Absolute: 0.1 10*3/uL (ref 0.1–1.0)
Monocytes Relative: 2 %
Neutro Abs: 4 10*3/uL (ref 1.7–7.7)
Neutrophils Relative %: 85 %
Platelets: 231 10*3/uL (ref 150–400)
RBC: 3.59 MIL/uL — ABNORMAL LOW (ref 3.87–5.11)
RDW: 16.1 % — ABNORMAL HIGH (ref 11.5–15.5)
WBC: 4.7 10*3/uL (ref 4.0–10.5)
nRBC: 0 % (ref 0.0–0.2)

## 2019-10-18 MED ORDER — BORTEZOMIB CHEMO SQ INJECTION 3.5 MG (2.5MG/ML)
1.3000 mg/m2 | Freq: Once | INTRAMUSCULAR | Status: AC
  Administered 2019-10-18: 2.25 mg via SUBCUTANEOUS
  Filled 2019-10-18: qty 0.9

## 2019-10-18 MED ORDER — DENOSUMAB 120 MG/1.7ML ~~LOC~~ SOLN
120.0000 mg | Freq: Once | SUBCUTANEOUS | Status: AC
  Administered 2019-10-18: 120 mg via SUBCUTANEOUS
  Filled 2019-10-18: qty 1.7

## 2019-10-18 MED ORDER — PROCHLORPERAZINE MALEATE 10 MG PO TABS
ORAL_TABLET | ORAL | Status: AC
  Filled 2019-10-18: qty 1

## 2019-10-18 MED ORDER — PROCHLORPERAZINE MALEATE 10 MG PO TABS
10.0000 mg | ORAL_TABLET | Freq: Once | ORAL | Status: AC
  Administered 2019-10-18: 10 mg via ORAL

## 2019-10-18 NOTE — Progress Notes (Signed)
Delton See consent obtained, teaching material given and instructed. Xgeva injection given per orders.      Treatment given per orders. Patient tolerated it well without problems. Vitals stable and discharged home from clinic ambulatory. Follow up as scheduled.

## 2019-10-18 NOTE — Patient Instructions (Signed)
[facility] Discharge Instructions for Patients Receiving Chemotherapy  Today you received the following chemotherapy agents   To help prevent nausea and vomiting after your treatment, we encourage you to take your nausea medication   If you develop nausea and vomiting that is not controlled by your nausea medication, call the clinic.   BELOW ARE SYMPTOMS THAT SHOULD BE REPORTED IMMEDIATELY:  *FEVER GREATER THAN 100.5 F  *CHILLS WITH OR WITHOUT FEVER  NAUSEA AND VOMITING THAT IS NOT CONTROLLED WITH YOUR NAUSEA MEDICATION  *UNUSUAL SHORTNESS OF BREATH  *UNUSUAL BRUISING OR BLEEDING  TENDERNESS IN MOUTH AND THROAT WITH OR WITHOUT PRESENCE OF ULCERS  *URINARY PROBLEMS  *BOWEL PROBLEMS  UNUSUAL RASH Items with * indicate a potential emergency and should be followed up as soon as possible.  Feel free to call the clinic should you have any questions or concerns. The clinic phone number is [phone].  Please show the CHEMO ALERT CARD at check-in to the Emergency Department and triage nurse.   

## 2019-10-22 ENCOUNTER — Other Ambulatory Visit (HOSPITAL_COMMUNITY): Payer: Self-pay | Admitting: *Deleted

## 2019-10-22 DIAGNOSIS — C9000 Multiple myeloma not having achieved remission: Secondary | ICD-10-CM

## 2019-10-22 MED ORDER — LENALIDOMIDE 15 MG PO CAPS: ORAL_CAPSULE | ORAL | 0 refills | Status: DC

## 2019-10-22 NOTE — Telephone Encounter (Signed)
Chart reviewed, revlimid refilled. 

## 2019-10-25 ENCOUNTER — Inpatient Hospital Stay (HOSPITAL_COMMUNITY): Payer: Medicare Other

## 2019-10-25 ENCOUNTER — Other Ambulatory Visit: Payer: Self-pay

## 2019-10-25 VITALS — BP 108/51 | HR 80 | Temp 96.9°F | Resp 18

## 2019-10-25 DIAGNOSIS — C9000 Multiple myeloma not having achieved remission: Secondary | ICD-10-CM

## 2019-10-25 DIAGNOSIS — Z5112 Encounter for antineoplastic immunotherapy: Secondary | ICD-10-CM | POA: Diagnosis not present

## 2019-10-25 DIAGNOSIS — Z79899 Other long term (current) drug therapy: Secondary | ICD-10-CM | POA: Diagnosis not present

## 2019-10-25 LAB — COMPREHENSIVE METABOLIC PANEL
ALT: 21 U/L (ref 0–44)
AST: 21 U/L (ref 15–41)
Albumin: 3.7 g/dL (ref 3.5–5.0)
Alkaline Phosphatase: 114 U/L (ref 38–126)
Anion gap: 13 (ref 5–15)
BUN: 25 mg/dL — ABNORMAL HIGH (ref 8–23)
CO2: 28 mmol/L (ref 22–32)
Calcium: 9.5 mg/dL (ref 8.9–10.3)
Chloride: 94 mmol/L — ABNORMAL LOW (ref 98–111)
Creatinine, Ser: 0.74 mg/dL (ref 0.44–1.00)
GFR calc Af Amer: >60 mL/min (ref >60)
GFR calc non Af Amer: >60 mL/min (ref >60)
Glucose, Bld: 143 mg/dL — ABNORMAL HIGH (ref 70–99)
Potassium: 3.9 mmol/L (ref 3.5–5.1)
Sodium: 135 mmol/L (ref 135–145)
Total Bilirubin: 0.5 mg/dL (ref 0.3–1.2)
Total Protein: 6.8 g/dL (ref 6.5–8.1)

## 2019-10-25 LAB — CBC WITH DIFFERENTIAL/PLATELET
Abs Immature Granulocytes: 0.06 10*3/uL (ref 0.00–0.07)
Basophils Absolute: 0 10*3/uL (ref 0.0–0.1)
Basophils Relative: 0 %
Eosinophils Absolute: 0 10*3/uL (ref 0.0–0.5)
Eosinophils Relative: 0 %
HCT: 31.4 % — ABNORMAL LOW (ref 36.0–46.0)
Hemoglobin: 10 g/dL — ABNORMAL LOW (ref 12.0–15.0)
Immature Granulocytes: 1 %
Lymphocytes Relative: 7 %
Lymphs Abs: 0.5 10*3/uL — ABNORMAL LOW (ref 0.7–4.0)
MCH: 29.6 pg (ref 26.0–34.0)
MCHC: 31.8 g/dL (ref 30.0–36.0)
MCV: 92.9 fL (ref 80.0–100.0)
Monocytes Absolute: 0.1 10*3/uL (ref 0.1–1.0)
Monocytes Relative: 1 %
Neutro Abs: 6.4 10*3/uL (ref 1.7–7.7)
Neutrophils Relative %: 91 %
Platelets: 241 10*3/uL (ref 150–400)
RBC: 3.38 MIL/uL — ABNORMAL LOW (ref 3.87–5.11)
RDW: 16.3 % — ABNORMAL HIGH (ref 11.5–15.5)
WBC: 7.1 10*3/uL (ref 4.0–10.5)
nRBC: 0 % (ref 0.0–0.2)

## 2019-10-25 MED ORDER — CYANOCOBALAMIN 1000 MCG/ML IJ SOLN
1000.0000 ug | Freq: Once | INTRAMUSCULAR | Status: AC
  Administered 2019-10-25: 1000 ug via INTRAMUSCULAR
  Filled 2019-10-25: qty 1

## 2019-10-25 MED ORDER — BORTEZOMIB CHEMO SQ INJECTION 3.5 MG (2.5MG/ML)
1.3000 mg/m2 | Freq: Once | INTRAMUSCULAR | Status: AC
  Administered 2019-10-25: 2.25 mg via SUBCUTANEOUS
  Filled 2019-10-25: qty 0.9

## 2019-10-25 MED ORDER — PROCHLORPERAZINE MALEATE 10 MG PO TABS
10.0000 mg | ORAL_TABLET | Freq: Once | ORAL | Status: AC
  Administered 2019-10-25: 10 mg via ORAL
  Filled 2019-10-25: qty 1

## 2019-10-25 NOTE — Progress Notes (Signed)
Labs reviewed today. Will proceed with velcade as planned. Labs meet parameters for treatment.  BUN 25 noted.  B12 injection given per orders.   Treatment given per orders. Patient tolerated it well without problems. Vitals stable and discharged home from clinic ambulatory. Follow up as scheduled.

## 2019-10-25 NOTE — Patient Instructions (Signed)
[facility] Discharge Instructions for Patients Receiving Chemotherapy  Today you received the following chemotherapy agents   To help prevent nausea and vomiting after your treatment, we encourage you to take your nausea medication   If you develop nausea and vomiting that is not controlled by your nausea medication, call the clinic.   BELOW ARE SYMPTOMS THAT SHOULD BE REPORTED IMMEDIATELY:  *FEVER GREATER THAN 100.5 F  *CHILLS WITH OR WITHOUT FEVER  NAUSEA AND VOMITING THAT IS NOT CONTROLLED WITH YOUR NAUSEA MEDICATION  *UNUSUAL SHORTNESS OF BREATH  *UNUSUAL BRUISING OR BLEEDING  TENDERNESS IN MOUTH AND THROAT WITH OR WITHOUT PRESENCE OF ULCERS  *URINARY PROBLEMS  *BOWEL PROBLEMS  UNUSUAL RASH Items with * indicate a potential emergency and should be followed up as soon as possible.  Feel free to call the clinic should you have any questions or concerns. The clinic phone number is [phone].  Please show the CHEMO ALERT CARD at check-in to the Emergency Department and triage nurse.   

## 2019-11-01 ENCOUNTER — Inpatient Hospital Stay (HOSPITAL_COMMUNITY): Payer: Medicare Other

## 2019-11-01 ENCOUNTER — Other Ambulatory Visit: Payer: Self-pay

## 2019-11-01 ENCOUNTER — Encounter (HOSPITAL_COMMUNITY): Payer: Self-pay

## 2019-11-01 VITALS — BP 118/64 | HR 73 | Temp 97.2°F | Resp 18

## 2019-11-01 DIAGNOSIS — C9000 Multiple myeloma not having achieved remission: Secondary | ICD-10-CM

## 2019-11-01 DIAGNOSIS — Z5112 Encounter for antineoplastic immunotherapy: Secondary | ICD-10-CM | POA: Diagnosis not present

## 2019-11-01 DIAGNOSIS — Z79899 Other long term (current) drug therapy: Secondary | ICD-10-CM | POA: Diagnosis not present

## 2019-11-01 LAB — COMPREHENSIVE METABOLIC PANEL
ALT: 39 U/L (ref 0–44)
AST: 32 U/L (ref 15–41)
Albumin: 4 g/dL (ref 3.5–5.0)
Alkaline Phosphatase: 99 U/L (ref 38–126)
Anion gap: 11 (ref 5–15)
BUN: 29 mg/dL — ABNORMAL HIGH (ref 8–23)
CO2: 26 mmol/L (ref 22–32)
Calcium: 9.2 mg/dL (ref 8.9–10.3)
Chloride: 96 mmol/L — ABNORMAL LOW (ref 98–111)
Creatinine, Ser: 0.74 mg/dL (ref 0.44–1.00)
GFR calc Af Amer: >60 mL/min (ref >60)
GFR calc non Af Amer: >60 mL/min (ref >60)
Glucose, Bld: 150 mg/dL — ABNORMAL HIGH (ref 70–99)
Potassium: 4.1 mmol/L (ref 3.5–5.1)
Sodium: 133 mmol/L — ABNORMAL LOW (ref 135–145)
Total Bilirubin: 0.7 mg/dL (ref 0.3–1.2)
Total Protein: 7.2 g/dL (ref 6.5–8.1)

## 2019-11-01 LAB — CBC WITH DIFFERENTIAL/PLATELET
Abs Immature Granulocytes: 0.05 10*3/uL (ref 0.00–0.07)
Basophils Absolute: 0 10*3/uL (ref 0.0–0.1)
Basophils Relative: 0 %
Eosinophils Absolute: 0 10*3/uL (ref 0.0–0.5)
Eosinophils Relative: 0 %
HCT: 32.6 % — ABNORMAL LOW (ref 36.0–46.0)
Hemoglobin: 10.3 g/dL — ABNORMAL LOW (ref 12.0–15.0)
Immature Granulocytes: 1 %
Lymphocytes Relative: 7 %
Lymphs Abs: 0.4 10*3/uL — ABNORMAL LOW (ref 0.7–4.0)
MCH: 29.7 pg (ref 26.0–34.0)
MCHC: 31.6 g/dL (ref 30.0–36.0)
MCV: 93.9 fL (ref 80.0–100.0)
Monocytes Absolute: 0.1 10*3/uL (ref 0.1–1.0)
Monocytes Relative: 1 %
Neutro Abs: 5.7 10*3/uL (ref 1.7–7.7)
Neutrophils Relative %: 91 %
Platelets: 173 10*3/uL (ref 150–400)
RBC: 3.47 MIL/uL — ABNORMAL LOW (ref 3.87–5.11)
RDW: 16.4 % — ABNORMAL HIGH (ref 11.5–15.5)
WBC: 6.3 10*3/uL (ref 4.0–10.5)
nRBC: 0 % (ref 0.0–0.2)

## 2019-11-01 MED ORDER — BORTEZOMIB CHEMO SQ INJECTION 3.5 MG (2.5MG/ML)
1.3000 mg/m2 | Freq: Once | INTRAMUSCULAR | Status: AC
  Administered 2019-11-01: 2.25 mg via SUBCUTANEOUS
  Filled 2019-11-01: qty 0.9

## 2019-11-01 MED ORDER — PROCHLORPERAZINE MALEATE 10 MG PO TABS
10.0000 mg | ORAL_TABLET | Freq: Once | ORAL | Status: AC
  Administered 2019-11-01: 10 mg via ORAL
  Filled 2019-11-01: qty 1

## 2019-11-01 NOTE — Patient Instructions (Signed)
[facility] Discharge Instructions for Patients Receiving Chemotherapy  Today you received the following chemotherapy agents   To help prevent nausea and vomiting after your treatment, we encourage you to take your nausea medication   If you develop nausea and vomiting that is not controlled by your nausea medication, call the clinic.   BELOW ARE SYMPTOMS THAT SHOULD BE REPORTED IMMEDIATELY:  *FEVER GREATER THAN 100.5 F  *CHILLS WITH OR WITHOUT FEVER  NAUSEA AND VOMITING THAT IS NOT CONTROLLED WITH YOUR NAUSEA MEDICATION  *UNUSUAL SHORTNESS OF BREATH  *UNUSUAL BRUISING OR BLEEDING  TENDERNESS IN MOUTH AND THROAT WITH OR WITHOUT PRESENCE OF ULCERS  *URINARY PROBLEMS  *BOWEL PROBLEMS  UNUSUAL RASH Items with * indicate a potential emergency and should be followed up as soon as possible.  Feel free to call the clinic should you have any questions or concerns. The clinic phone number is [phone].  Please show the CHEMO ALERT CARD at check-in to the Emergency Department and triage nurse.   

## 2019-11-01 NOTE — Progress Notes (Signed)
Labs within parameters for treatment.  Okay to proceed with treatment.   [name] tolerated velcade today with incidence. Discharged via wheelchair with daughter.

## 2019-11-02 LAB — PROTEIN ELECTROPHORESIS, SERUM
A/G Ratio: 1.2 (ref 0.7–1.7)
Albumin ELP: 3.5 g/dL (ref 2.9–4.4)
Alpha-1-Globulin: 0.3 g/dL (ref 0.0–0.4)
Alpha-2-Globulin: 0.9 g/dL (ref 0.4–1.0)
Beta Globulin: 1.1 g/dL (ref 0.7–1.3)
Gamma Globulin: 0.7 g/dL (ref 0.4–1.8)
Globulin, Total: 3 g/dL (ref 2.2–3.9)
Total Protein ELP: 6.5 g/dL (ref 6.0–8.5)

## 2019-11-02 LAB — KAPPA/LAMBDA LIGHT CHAINS
Kappa free light chain: 31.7 mg/L — ABNORMAL HIGH (ref 3.3–19.4)
Kappa, lambda light chain ratio: 2.09 — ABNORMAL HIGH (ref 0.26–1.65)
Lambda free light chains: 15.2 mg/L (ref 5.7–26.3)

## 2019-11-05 LAB — IMMUNOFIXATION ELECTROPHORESIS
IgA: 104 mg/dL (ref 64–422)
IgG (Immunoglobin G), Serum: 586 mg/dL (ref 586–1602)
IgM (Immunoglobulin M), Srm: 47 mg/dL (ref 26–217)
Total Protein ELP: 6.6 g/dL (ref 6.0–8.5)

## 2019-11-08 ENCOUNTER — Inpatient Hospital Stay (HOSPITAL_COMMUNITY): Payer: Medicare Other

## 2019-11-08 ENCOUNTER — Inpatient Hospital Stay (HOSPITAL_COMMUNITY): Payer: Medicare Other | Attending: Hematology

## 2019-11-08 ENCOUNTER — Other Ambulatory Visit: Payer: Self-pay

## 2019-11-08 ENCOUNTER — Inpatient Hospital Stay (HOSPITAL_BASED_OUTPATIENT_CLINIC_OR_DEPARTMENT_OTHER): Payer: Medicare Other | Admitting: Hematology

## 2019-11-08 VITALS — BP 116/62 | HR 72 | Temp 96.9°F | Resp 18 | Wt 136.7 lb

## 2019-11-08 DIAGNOSIS — C9000 Multiple myeloma not having achieved remission: Secondary | ICD-10-CM | POA: Diagnosis not present

## 2019-11-08 DIAGNOSIS — D509 Iron deficiency anemia, unspecified: Secondary | ICD-10-CM

## 2019-11-08 DIAGNOSIS — E538 Deficiency of other specified B group vitamins: Secondary | ICD-10-CM | POA: Insufficient documentation

## 2019-11-08 DIAGNOSIS — Z5112 Encounter for antineoplastic immunotherapy: Secondary | ICD-10-CM | POA: Insufficient documentation

## 2019-11-08 DIAGNOSIS — Z23 Encounter for immunization: Secondary | ICD-10-CM | POA: Diagnosis not present

## 2019-11-08 LAB — COMPREHENSIVE METABOLIC PANEL
ALT: 18 U/L (ref 0–44)
AST: 16 U/L (ref 15–41)
Albumin: 4 g/dL (ref 3.5–5.0)
Alkaline Phosphatase: 88 U/L (ref 38–126)
Anion gap: 10 (ref 5–15)
BUN: 23 mg/dL (ref 8–23)
CO2: 27 mmol/L (ref 22–32)
Calcium: 9.2 mg/dL (ref 8.9–10.3)
Chloride: 98 mmol/L (ref 98–111)
Creatinine, Ser: 0.79 mg/dL (ref 0.44–1.00)
GFR calc Af Amer: >60 mL/min (ref >60)
GFR calc non Af Amer: >60 mL/min (ref >60)
Glucose, Bld: 152 mg/dL — ABNORMAL HIGH (ref 70–99)
Potassium: 3.8 mmol/L (ref 3.5–5.1)
Sodium: 135 mmol/L (ref 135–145)
Total Bilirubin: 0.5 mg/dL (ref 0.3–1.2)
Total Protein: 7.2 g/dL (ref 6.5–8.1)

## 2019-11-08 LAB — CBC WITH DIFFERENTIAL/PLATELET
Abs Immature Granulocytes: 0.03 10*3/uL (ref 0.00–0.07)
Basophils Absolute: 0 10*3/uL (ref 0.0–0.1)
Basophils Relative: 1 %
Eosinophils Absolute: 0 10*3/uL (ref 0.0–0.5)
Eosinophils Relative: 0 %
HCT: 30.7 % — ABNORMAL LOW (ref 36.0–46.0)
Hemoglobin: 9.8 g/dL — ABNORMAL LOW (ref 12.0–15.0)
Immature Granulocytes: 1 %
Lymphocytes Relative: 11 %
Lymphs Abs: 0.4 10*3/uL — ABNORMAL LOW (ref 0.7–4.0)
MCH: 29.8 pg (ref 26.0–34.0)
MCHC: 31.9 g/dL (ref 30.0–36.0)
MCV: 93.3 fL (ref 80.0–100.0)
Monocytes Absolute: 0.1 10*3/uL (ref 0.1–1.0)
Monocytes Relative: 2 %
Neutro Abs: 3.3 10*3/uL (ref 1.7–7.7)
Neutrophils Relative %: 85 %
Platelets: 204 10*3/uL (ref 150–400)
RBC: 3.29 MIL/uL — ABNORMAL LOW (ref 3.87–5.11)
RDW: 16.4 % — ABNORMAL HIGH (ref 11.5–15.5)
WBC: 3.9 10*3/uL — ABNORMAL LOW (ref 4.0–10.5)
nRBC: 0 % (ref 0.0–0.2)

## 2019-11-08 LAB — MAGNESIUM: Magnesium: 2.1 mg/dL (ref 1.7–2.4)

## 2019-11-08 MED ORDER — PROCHLORPERAZINE MALEATE 10 MG PO TABS
ORAL_TABLET | ORAL | Status: AC
  Filled 2019-11-08: qty 1

## 2019-11-08 MED ORDER — BORTEZOMIB CHEMO SQ INJECTION 3.5 MG (2.5MG/ML)
1.3000 mg/m2 | Freq: Once | INTRAMUSCULAR | Status: AC
  Administered 2019-11-08: 2.25 mg via SUBCUTANEOUS
  Filled 2019-11-08: qty 0.9

## 2019-11-08 MED ORDER — PROCHLORPERAZINE MALEATE 10 MG PO TABS
10.0000 mg | ORAL_TABLET | Freq: Once | ORAL | Status: AC
  Administered 2019-11-08: 10 mg via ORAL

## 2019-11-08 NOTE — Patient Instructions (Addendum)
[place] at [facility] Discharge Instructions  You were seen today by Dr. Delton Coombes. He went over your recent results. You received your injection today; continue receiving your injections weekly as usual. You will be scheduled for an iron infusion. Dr. Delton Coombes will see you back in 5 weeks for labs and follow up.   Thank you for choosing [place] at [facility] to provide your oncology and hematology care.  To afford each patient quality time with our provider, please arrive at least 15 minutes before your scheduled appointment time.   If you have a lab appointment with the [facility] please come in thru the Main Entrance and check in at the main information desk  You need to re-schedule your appointment should you arrive 10 or more minutes late.  We strive to give you quality time with our providers, and arriving late affects you and other patients whose appointments are after yours.  Also, if you no show three or more times for appointments you may be dismissed from the clinic at the providers discretion.     Again, thank you for choosing [facility].  Our hope is that these requests will decrease the amount of time that you wait before being seen by our physicians.       _____________________________________________________________  Should you have questions after your visit to [facility], please contact our office at [phone] between the hours of 8:00 a.m. and 4:30 p.m.  Voicemails left after 4:00 p.m. will not be returned until the following business day.  For prescription refill requests, have your pharmacy contact our office and allow 72 hours.    Cancer Center Support Programs:   > Cancer Support Group  2nd Tuesday of the month 1pm-2pm, Journey Room

## 2019-11-08 NOTE — Progress Notes (Signed)
[place], [place] [address]   CLINIC:  Medical Oncology/Hematology  PCP:  [name], MD [address] / [place] [address] [phone]   REASON FOR VISIT:  Follow-up for IgA kappa plasma cell myeloma  PRIOR THERAPY: None  NGS Results: Not done  CURRENT THERAPY: Bortezomib weekly; Revlimid  BRIEF ONCOLOGIC HISTORY:  Oncology History  Multiple myeloma not having achieved remission ([place])  06/07/2019 Initial Diagnosis   Multiple myeloma not having achieved remission ([place])   07/04/2019 -  Chemotherapy   The patient had dexamethasone (DECADRON) 4 MG tablet, 1 of 1 cycle, Start date: 06/21/2019, End date: -- lenalidomide (REVLIMID) 15 MG capsule, 1 of 1 cycle, Start date: 10/22/2019, End date: -- bortezomib SQ (VELCADE) chemo injection 2.25 mg, 1.3 mg/m2 = 2.25 mg, Subcutaneous,  Once, 6 of 8 cycles Administration: 2.25 mg (07/04/2019), 2.25 mg (07/11/2019), 2.25 mg (07/26/2019), 2.25 mg (08/02/2019), 2.25 mg (08/09/2019), 2.25 mg (08/16/2019), 2.25 mg (08/23/2019), 2.25 mg (08/30/2019), 2.25 mg (09/06/2019), 2.25 mg (09/13/2019), 2.25 mg (09/20/2019), 2.25 mg (09/27/2019), 2.25 mg (10/04/2019), 2.25 mg (10/11/2019), 2.25 mg (10/18/2019), 2.25 mg (10/25/2019), 2.25 mg (11/01/2019)  for chemotherapy treatment.      CANCER STAGING: Cancer Staging No matching staging information was found for the patient.  INTERVAL HISTORY:  Courtney Arnold, a 79 y.o. female, returns for routine follow-up and consideration for next cycle of chemotherapy. Courtney Arnold was last seen on 10/11/2019.  Due for day #15 of cycle #6 of bortezomib today.   Overall, she tells me she has been feeling pretty well. She is tolerating the treatments well and denies having any numbness or tingling. She is taking Revlimid 2 weeks on and 1 week off. She takes Decadron 5 tablets on the days of her treatment. She denies having any new back aches or pains or jaw pain. She ambulates with a  cane.  Overall, she feels ready for next cycle of chemo today.    REVIEW OF SYSTEMS:  Review of Systems  Constitutional: Negative for appetite change and fatigue.  Musculoskeletal: Negative for arthralgias and back pain.  Neurological: Negative for numbness.  All other systems reviewed and are negative.   PAST MEDICAL/SURGICAL HISTORY:  Past Medical History:  Diagnosis Date  . Atrial fibrillation ([place]) 2011   Postop, spontaneous conversion to normal sinus after one hour  . Compression fracture 07/24/09   T12; kyphoplasty  . History of echocardiogram 5/11   EF 65%  . Hypertension   . Thyroid disease   . Tobacco abuse    Past Surgical History:  Procedure Laterality Date  . BACK SURGERY    . BACK SURGERY  06/06/2015  . BREAST EXCISIONAL BIOPSY Left    50 years ago  benign  . CHOLECYSTECTOMY N/A 04/25/2015   Procedure: LAPAROSCOPIC CHOLECYSTECTOMY WITH INTRAOPERATIVE CHOLANGIOGRAM;  Surgeon: Mickeal Skinner, MD;  Location: [facility];  Service: General;  Laterality: N/A;  . COLONOSCOPY N/A 11/26/2015   Procedure: COLONOSCOPY;  Surgeon: Daneil Dolin, MD;  Location: [facility];  Service: Endoscopy;  Laterality: N/A;  7:30 am  . ERCP N/A 04/14/2015   Procedure: ENDOSCOPIC RETROGRADE CHOLANGIOPANCREATOGRAPHY (ERCP) Biliary Sphincterotomy, 10x7 stent placement Dilated bilary system just not well seen;  Surgeon: Rogene Houston, MD;  Location: [facility];  Service: Endoscopy;  Laterality: N/A;  . ERCP N/A 06/12/2015   Procedure: ENDOSCOPIC RETROGRADE CHOLANGIOPANCREATOGRAPHY (ERCP);  Surgeon: Rogene Houston, MD;  Location[facility];  Service: Endoscopy;  Laterality:  N/A;  . ESOPHAGOGASTRODUODENOSCOPY N/A 06/12/2015   Procedure: DIAGNOSTIC ESOPHAGOGASTRODUODENOSCOPY (EGD);  Surgeon: Rogene Houston, MD;  [facility];  Service: Endoscopy;  Laterality: N/A;  . FEMUR IM NAIL Right 05/11/2019   Procedure: RETROGRADE INTRAMEDULLARY NAIL FEMORAL;  Surgeon: Shona Needles, MD;   Location: [facility];  Service: Orthopedics;  Laterality: Right;  . STENT REMOVAL  06/12/2015   Procedure: STENT REMOVAL ;  Surgeon: Rogene Houston, MD;  Location: [facility];  Service: Endoscopy;;    SOCIAL HISTORY:  Social History   Socioeconomic History  . Marital status: Widowed    Spouse name: Not on file  . Number of children: 2  . Years of education: Not on file  . Highest education level: Not on file  Occupational History  . Occupation: retired  Tobacco Use  . Smoking status: Former Smoker    Packs/day: 0.15    Years: 20.00    Pack years: 3.00    Quit date: 03/08/2013    Years since quitting: 6.6  . Smokeless tobacco: Never Used  Vaping Use  . Vaping Use: Never used  Substance and Sexual Activity  . Alcohol use: No    Alcohol/week: 0.0 standard drinks  . Drug use: No  . Sexual activity: Not Currently  Other Topics Concern  . Not on file  Social History Narrative   Active in gardens and does yard work.   Social Determinants of Health   Financial Resource Strain: Low Risk   . Difficulty of Paying Living Expenses: Not hard at all  Food Insecurity: No Food Insecurity  . Worried About Charity fundraiser in the Last Year: Never true  . Ran Out of Food in the Last Year: Never true  Transportation Needs: No Transportation Needs  . Lack of Transportation (Medical): No  . Lack of Transportation (Non-Medical): No  Physical Activity: Inactive  . Days of Exercise per Week: 0 days  . Minutes of Exercise per Session: 0 min  Stress: No Stress Concern Present  . Feeling of Stress : Not at all  Social Connections:   . Frequency of Communication with Friends and Family: Not on file  . Frequency of Social Gatherings with Friends and Family: Not on file  . Attends Religious Services: Not on file  . Active Member of Clubs or Organizations: Not on file  . Attends Archivist Meetings: Not on file  . Marital Status: Not on file  Intimate Partner Violence: Not At Risk    . Fear of Current or Ex-Partner: No  . Emotionally Abused: No  . Physically Abused: No  . Sexually Abused: No    FAMILY HISTORY:  Family History  Problem Relation Age of Onset  . Heart attack Father   . Stroke Father   . Breast cancer Sister   . Thyroid disease Neg Hx   . Colon cancer Neg Hx     CURRENT MEDICATIONS:  Current Outpatient Medications  Medication Sig Dispense Refill  . acyclovir (ZOVIRAX) 400 MG tablet TAKE (1) TABLET BY MOUTH TWICE DAILY. 60 tablet 6  . aspirin EC 81 MG tablet Take 81 mg by mouth daily.    . bortezomib IV (VELCADE) 3.5 MG injection Inject 1.3 mg/m2 into the vein once a week. Day 1, 8, 15 every 21 days    . carboxymethylcellulose (REFRESH PLUS) 0.5 % SOLN Place 1 drop into both eyes in the morning, at noon, in the evening, and at bedtime.    Marland Kitchen  Cholecalciferol (VITAMIN D) 125 MCG (5000 UT) CAPS Take 5,000 Units by mouth daily. 90 capsule 1  . cyclobenzaprine (FLEXERIL) 5 MG tablet Take 1 tablet (5 mg total) by mouth 3 (three) times daily as needed for muscle spasms. 30 tablet 0  . dexamethasone (DECADRON) 4 MG tablet Take 5 tablets (20 mg) on days 1, 8, and 15 of chemo. Repeat every 21 days. 30 tablet 3  . lenalidomide (REVLIMID) 15 MG capsule Take one capsule daily on days 1-14 every 21 days. 14 capsule 0  . levothyroxine (SYNTHROID) 100 MCG tablet Take 1 tablet (100 mcg total) by mouth daily. 30 tablet 0  . NON FORMULARY Diet -Regular    . prochlorperazine (COMPAZINE) 10 MG tablet Take 1 tablet (10 mg total) by mouth every 6 (six) hours as needed (Nausea or vomiting). 30 tablet 1   No current facility-administered medications for this visit.    ALLERGIES:  No Known Allergies  PHYSICAL EXAM:  Performance status (ECOG): 1 - Symptomatic but completely ambulatory  Vitals:   11/08/19 1450  BP: 116/62  Pulse: 72  Resp: 18  Temp: (!) 96.9 F (36.1 C)  SpO2: 95%   Wt Readings from Last 3 Encounters:  11/08/19 136 lb 11.2 oz (62 kg)  10/11/19  134 lb 14.4 oz (61.2 kg)  09/20/19 135 lb (61.2 kg)   Physical Exam Vitals reviewed.  Constitutional:      Appearance: Normal appearance.  Neurological:     General: No focal deficit present.     Mental Status: She is alert and oriented to person, place, and time.  Psychiatric:        Mood and Affect: Mood normal.        Behavior: Behavior normal.     LABORATORY DATA:  I have reviewed the labs as listed.  CBC Latest Ref Rng & Units 11/08/2019 11/01/2019 10/25/2019  WBC 4.0 - 10.5 K/uL 3.9(L) 6.3 7.1  Hemoglobin 12.0 - 15.0 g/dL 9.8(L) 10.3(L) 10.0(L)  Hematocrit 36 - 46 % 30.7(L) 32.6(L) 31.4(L)  Platelets 150 - 400 K/uL 204 173 241   CMP Latest Ref Rng & Units 11/08/2019 11/01/2019 10/25/2019  Glucose 70 - 99 mg/dL 152(H) 150(H) 143(H)  BUN 8 - 23 mg/dL 23 29(H) 25(H)  Creatinine 0.44 - 1.00 mg/dL 0.79 0.74 0.74  Sodium 135 - 145 mmol/L 135 133(L) 135  Potassium 3.5 - 5.1 mmol/L 3.8 4.1 3.9  Chloride 98 - 111 mmol/L 98 96(L) 94(L)  CO2 22 - 32 mmol/L 27 26 28   Calcium 8.9 - 10.3 mg/dL 9.2 9.2 9.5  Total Protein 6.5 - 8.1 g/dL 7.2 7.2 6.8  Total Bilirubin 0.3 - 1.2 mg/dL 0.5 0.7 0.5  Alkaline Phos 38 - 126 U/L 88 99 114  AST 15 - 41 U/L 16 32 21  ALT 0 - 44 U/L 18 39 21   Lab Results  Component Value Date   TOTALPROTELP 6.6 11/01/2019   TOTALPROTELP 6.5 11/01/2019   ALBUMINELP 3.5 11/01/2019   A1GS 0.3 11/01/2019   A2GS 0.9 11/01/2019   BETS 1.1 11/01/2019   GAMS 0.7 11/01/2019   MSPIKE Not Observed 11/01/2019   SPEI Comment 11/01/2019    Lab Results  Component Value Date   KPAFRELGTCHN 31.7 (H) 11/01/2019   LAMBDASER 15.2 11/01/2019   KAPLAMBRATIO 2.09 (H) 11/01/2019    DIAGNOSTIC IMAGING:  I have independently reviewed the scans and discussed with the patient. No results found.   ASSESSMENT:  1. IgA kappa plasma cell myeloma: -IgA  kappa plasma cell myeloma diagnosed on right sacral bone biopsy on 05/17/2019. SPEP did not show M spike. Immunofixation shows  IgA kappa. Kappa light chains 42.8, lambda light chains at 9.9, ratio 4.32. Beta-2 microglobulin 3.7. -PET scan on 06/04/2019 showed expansile lytic lesion involving the right sacral wing. Lytic lesion involving T10 vertebral body. Left seventh rib lesion. Increased uptake in a pathological fracture involving distal aspect of the right femur. 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 -RVD started on 06/26/2019.Revlimid 15 mg 2 weeks on/1 week off.   PLAN:  1. IgA kappa plasma cell myeloma: -Myeloma labs from 11/01/2019 shows M spike is 0.  Kappa light chain is 31.7 with ratio of 2.09.  CBC shows normal white count and platelets.  LFTs were normal. -She is tolerating Revlimid 15 mg 2 weeks on 1 week off very well.  She is also taking dexamethasone 20 mg weekly.  No side effects from Velcade so far. -She will continue with treatments.  We will see her back in 4 weeks for follow-up.  If her myeloma panel has been negative for 2-3 times, will consider maintenance Revlimid.  2. Back pain: -This has completely improved since the start of therapy.  3. Normocytic anemia: -Hemoglobin today is 9.8.  Will recommend Feraheme x1 infusion.  4. ID prophylaxis: -Continue acyclovir twice daily.  Continue aspirin 81 mg daily.  5.  Myeloma bone disease: -Denosumab started on 10/18/2019.  She is tolerating very well.  Continue calcium twice daily.   Orders placed this encounter:  No orders of the defined types were placed in this encounter.    Derek Jack, MD [facility] [phone]   I, Milinda Antis, am acting as a scribe for Dr. Sanda Linger.  I, Derek Jack MD, have reviewed the above documentation for accuracy and completeness, and I agree with the above.

## 2019-11-08 NOTE — Progress Notes (Signed)
Patient presents today for Velcade injection. Vital signs stable. Patient has no complaints of any pain or changes since her last visit.   Treatment given today per MD orders. Tolerated  without adverse affects. Vital signs stable. No complaints at this time. Discharged from clinic ambulatory. F/U with [facility] as scheduled.

## 2019-11-08 NOTE — Patient Instructions (Signed)
[facility] Discharge Instructions for Patients Receiving Chemotherapy  Today you received the following chemotherapy agents   To help prevent nausea and vomiting after your treatment, we encourage you to take your nausea medication   If you develop nausea and vomiting that is not controlled by your nausea medication, call the clinic.   BELOW ARE SYMPTOMS THAT SHOULD BE REPORTED IMMEDIATELY:  *FEVER GREATER THAN 100.5 F  *CHILLS WITH OR WITHOUT FEVER  NAUSEA AND VOMITING THAT IS NOT CONTROLLED WITH YOUR NAUSEA MEDICATION  *UNUSUAL SHORTNESS OF BREATH  *UNUSUAL BRUISING OR BLEEDING  TENDERNESS IN MOUTH AND THROAT WITH OR WITHOUT PRESENCE OF ULCERS  *URINARY PROBLEMS  *BOWEL PROBLEMS  UNUSUAL RASH Items with * indicate a potential emergency and should be followed up as soon as possible.  Feel free to call the clinic should you have any questions or concerns. The clinic phone number is [phone].  Please show the CHEMO ALERT CARD at check-in to the Emergency Department and triage nurse.   

## 2019-11-08 NOTE — Progress Notes (Signed)
Patient was assessed by Dr. Katragadda and labs have been reviewed.  Patient is okay to proceed with treatment today. Primary RN and pharmacy aware.   

## 2019-11-13 ENCOUNTER — Other Ambulatory Visit (HOSPITAL_COMMUNITY): Payer: Self-pay

## 2019-11-13 DIAGNOSIS — C9000 Multiple myeloma not having achieved remission: Secondary | ICD-10-CM

## 2019-11-13 MED ORDER — LENALIDOMIDE 15 MG PO CAPS: ORAL_CAPSULE | ORAL | 0 refills | Status: DC

## 2019-11-13 NOTE — Telephone Encounter (Signed)
Chart reviewed. Revlimid refilled per Dr. Delton Coombes.

## 2019-11-15 ENCOUNTER — Inpatient Hospital Stay (HOSPITAL_COMMUNITY): Payer: Medicare Other

## 2019-11-15 ENCOUNTER — Other Ambulatory Visit: Payer: Self-pay

## 2019-11-15 ENCOUNTER — Encounter (HOSPITAL_COMMUNITY): Payer: Self-pay

## 2019-11-15 VITALS — BP 122/54 | HR 68 | Temp 96.7°F | Resp 18

## 2019-11-15 DIAGNOSIS — C9000 Multiple myeloma not having achieved remission: Secondary | ICD-10-CM

## 2019-11-15 DIAGNOSIS — D509 Iron deficiency anemia, unspecified: Secondary | ICD-10-CM | POA: Diagnosis not present

## 2019-11-15 DIAGNOSIS — E538 Deficiency of other specified B group vitamins: Secondary | ICD-10-CM | POA: Diagnosis not present

## 2019-11-15 DIAGNOSIS — Z23 Encounter for immunization: Secondary | ICD-10-CM | POA: Diagnosis not present

## 2019-11-15 DIAGNOSIS — Z5112 Encounter for antineoplastic immunotherapy: Secondary | ICD-10-CM | POA: Diagnosis not present

## 2019-11-15 LAB — CBC WITH DIFFERENTIAL/PLATELET
Abs Immature Granulocytes: 0.08 10*3/uL — ABNORMAL HIGH (ref 0.00–0.07)
Basophils Absolute: 0 10*3/uL (ref 0.0–0.1)
Basophils Relative: 0 %
Eosinophils Absolute: 0 10*3/uL (ref 0.0–0.5)
Eosinophils Relative: 0 %
HCT: 30.6 % — ABNORMAL LOW (ref 36.0–46.0)
Hemoglobin: 9.7 g/dL — ABNORMAL LOW (ref 12.0–15.0)
Immature Granulocytes: 1 %
Lymphocytes Relative: 8 %
Lymphs Abs: 0.5 10*3/uL — ABNORMAL LOW (ref 0.7–4.0)
MCH: 29.8 pg (ref 26.0–34.0)
MCHC: 31.7 g/dL (ref 30.0–36.0)
MCV: 94.2 fL (ref 80.0–100.0)
Monocytes Absolute: 0 10*3/uL — ABNORMAL LOW (ref 0.1–1.0)
Monocytes Relative: 1 %
Neutro Abs: 5.3 10*3/uL (ref 1.7–7.7)
Neutrophils Relative %: 90 %
Platelets: 257 10*3/uL (ref 150–400)
RBC: 3.25 MIL/uL — ABNORMAL LOW (ref 3.87–5.11)
RDW: 16.8 % — ABNORMAL HIGH (ref 11.5–15.5)
WBC: 5.9 10*3/uL (ref 4.0–10.5)
nRBC: 0 % (ref 0.0–0.2)

## 2019-11-15 LAB — COMPREHENSIVE METABOLIC PANEL
ALT: 23 U/L (ref 0–44)
AST: 23 U/L (ref 15–41)
Albumin: 3.9 g/dL (ref 3.5–5.0)
Alkaline Phosphatase: 79 U/L (ref 38–126)
Anion gap: 11 (ref 5–15)
BUN: 17 mg/dL (ref 8–23)
CO2: 28 mmol/L (ref 22–32)
Calcium: 9.2 mg/dL (ref 8.9–10.3)
Chloride: 97 mmol/L — ABNORMAL LOW (ref 98–111)
Creatinine, Ser: 0.78 mg/dL (ref 0.44–1.00)
GFR calc Af Amer: >60 mL/min (ref >60)
GFR calc non Af Amer: >60 mL/min (ref >60)
Glucose, Bld: 153 mg/dL — ABNORMAL HIGH (ref 70–99)
Potassium: 3.8 mmol/L (ref 3.5–5.1)
Sodium: 136 mmol/L (ref 135–145)
Total Bilirubin: 0.6 mg/dL (ref 0.3–1.2)
Total Protein: 6.8 g/dL (ref 6.5–8.1)

## 2019-11-15 MED ORDER — DENOSUMAB 120 MG/1.7ML ~~LOC~~ SOLN
120.0000 mg | Freq: Once | SUBCUTANEOUS | Status: AC
  Administered 2019-11-15: 120 mg via SUBCUTANEOUS
  Filled 2019-11-15: qty 1.7

## 2019-11-15 MED ORDER — BORTEZOMIB CHEMO SQ INJECTION 3.5 MG (2.5MG/ML)
1.3000 mg/m2 | Freq: Once | INTRAMUSCULAR | Status: AC
  Administered 2019-11-15: 2.25 mg via SUBCUTANEOUS
  Filled 2019-11-15: qty 0.9

## 2019-11-15 MED ORDER — PROCHLORPERAZINE MALEATE 10 MG PO TABS
10.0000 mg | ORAL_TABLET | Freq: Once | ORAL | Status: AC
  Administered 2019-11-15: 10 mg via ORAL
  Filled 2019-11-15: qty 1

## 2019-11-15 NOTE — Patient Instructions (Signed)
[place] at [facility] Discharge Instructions  Received Delton See and Velcade injection today. Follow-up as scheduled   Thank you for choosing [place] at [facility] to provide your oncology and hematology care.  To afford each patient quality time with our provider, please arrive at least 15 minutes before your scheduled appointment time.   If you have a lab appointment with the [facility] please come in thru the Main Entrance and check in at the main information desk.  You need to re-schedule your appointment should you arrive 10 or more minutes late.  We strive to give you quality time with our providers, and arriving late affects you and other patients whose appointments are after yours.  Also, if you no show three or more times for appointments you may be dismissed from the clinic at the providers discretion.     Again, thank you for choosing [facility].  Our hope is that these requests will decrease the amount of time that you wait before being seen by our physicians.       _____________________________________________________________  Should you have questions after your visit to [facility], please contact our office at [phone] and follow the prompts.  Our office hours are 8:00 a.m. and 4:30 p.m. Monday - Friday.  Please note that voicemails left after 4:00 p.m. may not be returned until the following business day.  We are closed weekends and major holidays.  You do have access to a nurse 24-7, just call the main number to the clinic [phone] and do not press any options, hold on the line and a nurse will answer the phone.    For prescription refill requests, have your pharmacy contact our office and allow 72 hours.    Due to Covid, you will need to wear a mask upon entering the hospital. If you do not have a mask, a mask will be given to you at the Main Entrance upon arrival. For doctor visits,  patients may have 1 support person age 58 or older with them. For treatment visits, patients can not have anyone with them due to social distancing guidelines and our immunocompromised population.

## 2019-11-15 NOTE — Progress Notes (Signed)
Courtney Arnold tolerated Velcade and Xgeva injections well without complaints or incident. Labs reviewed prior to administering these medications. Calcium 9.2 today and pt denied any tooth or jaw pain and no recent or future dental visits. VSS Pt discharged self ambulatory using her cane in satisfactory condition accompanied by her daughter

## 2019-11-22 ENCOUNTER — Inpatient Hospital Stay (HOSPITAL_COMMUNITY): Payer: Medicare Other

## 2019-11-22 ENCOUNTER — Other Ambulatory Visit: Payer: Self-pay

## 2019-11-22 ENCOUNTER — Other Ambulatory Visit (HOSPITAL_COMMUNITY): Payer: Self-pay | Admitting: Nurse Practitioner

## 2019-11-22 ENCOUNTER — Encounter (HOSPITAL_COMMUNITY): Payer: Self-pay

## 2019-11-22 VITALS — BP 120/47 | HR 73 | Temp 97.0°F | Resp 18

## 2019-11-22 DIAGNOSIS — C9000 Multiple myeloma not having achieved remission: Secondary | ICD-10-CM

## 2019-11-22 DIAGNOSIS — Z5112 Encounter for antineoplastic immunotherapy: Secondary | ICD-10-CM | POA: Diagnosis not present

## 2019-11-22 DIAGNOSIS — D509 Iron deficiency anemia, unspecified: Secondary | ICD-10-CM | POA: Diagnosis not present

## 2019-11-22 DIAGNOSIS — Z23 Encounter for immunization: Secondary | ICD-10-CM | POA: Diagnosis not present

## 2019-11-22 DIAGNOSIS — E538 Deficiency of other specified B group vitamins: Secondary | ICD-10-CM | POA: Diagnosis not present

## 2019-11-22 LAB — COMPREHENSIVE METABOLIC PANEL
ALT: 38 U/L (ref 0–44)
AST: 29 U/L (ref 15–41)
Albumin: 3.8 g/dL (ref 3.5–5.0)
Alkaline Phosphatase: 90 U/L (ref 38–126)
Anion gap: 10 (ref 5–15)
BUN: 19 mg/dL (ref 8–23)
CO2: 28 mmol/L (ref 22–32)
Calcium: 9.4 mg/dL (ref 8.9–10.3)
Chloride: 95 mmol/L — ABNORMAL LOW (ref 98–111)
Creatinine, Ser: 0.81 mg/dL (ref 0.44–1.00)
GFR calc Af Amer: >60 mL/min (ref >60)
GFR calc non Af Amer: >60 mL/min (ref >60)
Glucose, Bld: 154 mg/dL — ABNORMAL HIGH (ref 70–99)
Potassium: 4 mmol/L (ref 3.5–5.1)
Sodium: 133 mmol/L — ABNORMAL LOW (ref 135–145)
Total Bilirubin: 0.6 mg/dL (ref 0.3–1.2)
Total Protein: 6.7 g/dL (ref 6.5–8.1)

## 2019-11-22 LAB — CBC WITH DIFFERENTIAL/PLATELET
Abs Immature Granulocytes: 0.06 10*3/uL (ref 0.00–0.07)
Basophils Absolute: 0 10*3/uL (ref 0.0–0.1)
Basophils Relative: 0 %
Eosinophils Absolute: 0 10*3/uL (ref 0.0–0.5)
Eosinophils Relative: 0 %
HCT: 30.1 % — ABNORMAL LOW (ref 36.0–46.0)
Hemoglobin: 9.4 g/dL — ABNORMAL LOW (ref 12.0–15.0)
Immature Granulocytes: 1 %
Lymphocytes Relative: 8 %
Lymphs Abs: 0.4 10*3/uL — ABNORMAL LOW (ref 0.7–4.0)
MCH: 29.5 pg (ref 26.0–34.0)
MCHC: 31.2 g/dL (ref 30.0–36.0)
MCV: 94.4 fL (ref 80.0–100.0)
Monocytes Absolute: 0.1 10*3/uL (ref 0.1–1.0)
Monocytes Relative: 2 %
Neutro Abs: 4.1 10*3/uL (ref 1.7–7.7)
Neutrophils Relative %: 89 %
Platelets: 217 10*3/uL (ref 150–400)
RBC: 3.19 MIL/uL — ABNORMAL LOW (ref 3.87–5.11)
RDW: 17.2 % — ABNORMAL HIGH (ref 11.5–15.5)
WBC: 4.7 10*3/uL (ref 4.0–10.5)
nRBC: 0 % (ref 0.0–0.2)

## 2019-11-22 MED ORDER — CYANOCOBALAMIN 1000 MCG/ML IJ SOLN
INTRAMUSCULAR | Status: AC
  Filled 2019-11-22: qty 1

## 2019-11-22 MED ORDER — EPINEPHRINE 1 MG/10ML IJ SOSY: 0.2500 mg | PREFILLED_SYRINGE | Freq: Once | INTRAMUSCULAR | Status: DC | PRN

## 2019-11-22 MED ORDER — CYANOCOBALAMIN 1000 MCG/ML IJ SOLN
1000.0000 ug | Freq: Once | INTRAMUSCULAR | Status: AC
  Administered 2019-11-22: 1000 ug via INTRAMUSCULAR

## 2019-11-22 MED ORDER — SODIUM CHLORIDE 0.9 % IV SOLN: Freq: Once | INTRAVENOUS | Status: AC

## 2019-11-22 MED ORDER — ALBUTEROL SULFATE (2.5 MG/3ML) 0.083% IN NEBU: 2.5000 mg | INHALATION_SOLUTION | Freq: Once | RESPIRATORY_TRACT | Status: DC | PRN

## 2019-11-22 MED ORDER — EPINEPHRINE 0.3 MG/0.3ML IJ SOAJ: 0.3000 mg | Freq: Once | INTRAMUSCULAR | Status: DC | PRN

## 2019-11-22 MED ORDER — METHYLPREDNISOLONE SODIUM SUCC 125 MG IJ SOLR: 125.0000 mg | Freq: Once | INTRAMUSCULAR | Status: DC | PRN

## 2019-11-22 MED ORDER — DIPHENHYDRAMINE HCL 50 MG/ML IJ SOLN: 25.0000 mg | Freq: Once | INTRAMUSCULAR | Status: DC | PRN

## 2019-11-22 MED ORDER — BORTEZOMIB CHEMO SQ INJECTION 3.5 MG (2.5MG/ML)
1.3000 mg/m2 | Freq: Once | INTRAMUSCULAR | Status: AC
  Administered 2019-11-22: 2.25 mg via SUBCUTANEOUS
  Filled 2019-11-22: qty 0.9

## 2019-11-22 MED ORDER — SODIUM CHLORIDE 0.9 % IV SOLN: Freq: Once | INTRAVENOUS | Status: DC | PRN

## 2019-11-22 MED ORDER — FAMOTIDINE IN NACL 20-0.9 MG/50ML-% IV SOLN: 20.0000 mg | Freq: Once | INTRAVENOUS | Status: DC | PRN

## 2019-11-22 MED ORDER — DIPHENHYDRAMINE HCL 50 MG/ML IJ SOLN: 50.0000 mg | Freq: Once | INTRAMUSCULAR | Status: DC | PRN

## 2019-11-22 MED ORDER — SODIUM CHLORIDE 0.9 % IV SOLN
510.0000 mg | Freq: Once | INTRAVENOUS | Status: AC
  Administered 2019-11-22: 510 mg via INTRAVENOUS
  Filled 2019-11-22: qty 510

## 2019-11-22 MED ORDER — PROCHLORPERAZINE MALEATE 10 MG PO TABS
10.0000 mg | ORAL_TABLET | Freq: Once | ORAL | Status: AC
  Administered 2019-11-22: 10 mg via ORAL
  Filled 2019-11-22: qty 1

## 2019-11-22 NOTE — Progress Notes (Signed)
Labs reviewed by [facility] NP. Vital signs stable. Labs within parameters for treatment. Proceed with treatment message received.   Feraheme given today per MD orders. Tolerated infusion without adverse affects. Vital signs stable. No complaints at this time. Discharged from clinic ambulatory in stable condition. Alert and oriented x 3.  F/U with [facility] as scheduled.

## 2019-11-22 NOTE — Patient Instructions (Signed)
[facility] Discharge Instructions for Patients Receiving Chemotherapy  Today you received the following chemotherapy agents   To help prevent nausea and vomiting after your treatment, we encourage you to take your nausea medication   If you develop nausea and vomiting that is not controlled by your nausea medication, call the clinic.   BELOW ARE SYMPTOMS THAT SHOULD BE REPORTED IMMEDIATELY:  *FEVER GREATER THAN 100.5 F  *CHILLS WITH OR WITHOUT FEVER  NAUSEA AND VOMITING THAT IS NOT CONTROLLED WITH YOUR NAUSEA MEDICATION  *UNUSUAL SHORTNESS OF BREATH  *UNUSUAL BRUISING OR BLEEDING  TENDERNESS IN MOUTH AND THROAT WITH OR WITHOUT PRESENCE OF ULCERS  *URINARY PROBLEMS  *BOWEL PROBLEMS  UNUSUAL RASH Items with * indicate a potential emergency and should be followed up as soon as possible.  Feel free to call the clinic should you have any questions or concerns. The clinic phone number is [phone].  Please show the CHEMO ALERT CARD at check-in to the Emergency Department and triage nurse.   

## 2019-11-29 ENCOUNTER — Encounter (HOSPITAL_COMMUNITY): Payer: Self-pay

## 2019-11-29 ENCOUNTER — Inpatient Hospital Stay (HOSPITAL_COMMUNITY): Payer: Medicare Other

## 2019-11-29 ENCOUNTER — Other Ambulatory Visit: Payer: Self-pay

## 2019-11-29 VITALS — BP 131/57 | HR 76 | Temp 96.8°F | Resp 18 | Wt 137.8 lb

## 2019-11-29 DIAGNOSIS — Z23 Encounter for immunization: Secondary | ICD-10-CM | POA: Diagnosis not present

## 2019-11-29 DIAGNOSIS — C9000 Multiple myeloma not having achieved remission: Secondary | ICD-10-CM | POA: Diagnosis not present

## 2019-11-29 DIAGNOSIS — E538 Deficiency of other specified B group vitamins: Secondary | ICD-10-CM | POA: Diagnosis not present

## 2019-11-29 DIAGNOSIS — D509 Iron deficiency anemia, unspecified: Secondary | ICD-10-CM | POA: Diagnosis not present

## 2019-11-29 DIAGNOSIS — Z5112 Encounter for antineoplastic immunotherapy: Secondary | ICD-10-CM | POA: Diagnosis not present

## 2019-11-29 LAB — COMPREHENSIVE METABOLIC PANEL
ALT: 23 U/L (ref 0–44)
AST: 18 U/L (ref 15–41)
Albumin: 3.7 g/dL (ref 3.5–5.0)
Alkaline Phosphatase: 89 U/L (ref 38–126)
Anion gap: 9 (ref 5–15)
BUN: 13 mg/dL (ref 8–23)
CO2: 29 mmol/L (ref 22–32)
Calcium: 9.9 mg/dL (ref 8.9–10.3)
Chloride: 98 mmol/L (ref 98–111)
Creatinine, Ser: 0.73 mg/dL (ref 0.44–1.00)
GFR calc Af Amer: >60 mL/min (ref >60)
GFR calc non Af Amer: >60 mL/min (ref >60)
Glucose, Bld: 155 mg/dL — ABNORMAL HIGH (ref 70–99)
Potassium: 4.2 mmol/L (ref 3.5–5.1)
Sodium: 136 mmol/L (ref 135–145)
Total Bilirubin: 0.4 mg/dL (ref 0.3–1.2)
Total Protein: 6.8 g/dL (ref 6.5–8.1)

## 2019-11-29 LAB — CBC WITH DIFFERENTIAL/PLATELET
Abs Immature Granulocytes: 0.04 10*3/uL (ref 0.00–0.07)
Basophils Absolute: 0 10*3/uL (ref 0.0–0.1)
Basophils Relative: 1 %
Eosinophils Absolute: 0 10*3/uL (ref 0.0–0.5)
Eosinophils Relative: 0 %
HCT: 31.2 % — ABNORMAL LOW (ref 36.0–46.0)
Hemoglobin: 9.7 g/dL — ABNORMAL LOW (ref 12.0–15.0)
Immature Granulocytes: 1 %
Lymphocytes Relative: 12 %
Lymphs Abs: 0.4 10*3/uL — ABNORMAL LOW (ref 0.7–4.0)
MCH: 30.4 pg (ref 26.0–34.0)
MCHC: 31.1 g/dL (ref 30.0–36.0)
MCV: 97.8 fL (ref 80.0–100.0)
Monocytes Absolute: 0.1 10*3/uL (ref 0.1–1.0)
Monocytes Relative: 2 %
Neutro Abs: 3.2 10*3/uL (ref 1.7–7.7)
Neutrophils Relative %: 84 %
Platelets: 230 10*3/uL (ref 150–400)
RBC: 3.19 MIL/uL — ABNORMAL LOW (ref 3.87–5.11)
RDW: 18.3 % — ABNORMAL HIGH (ref 11.5–15.5)
WBC: 3.8 10*3/uL — ABNORMAL LOW (ref 4.0–10.5)
nRBC: 0 % (ref 0.0–0.2)

## 2019-11-29 MED ORDER — BORTEZOMIB CHEMO SQ INJECTION 3.5 MG (2.5MG/ML)
1.3000 mg/m2 | Freq: Once | INTRAMUSCULAR | Status: AC
  Administered 2019-11-29: 2.25 mg via SUBCUTANEOUS
  Filled 2019-11-29: qty 0.9

## 2019-11-29 MED ORDER — PROCHLORPERAZINE MALEATE 10 MG PO TABS
ORAL_TABLET | ORAL | Status: AC
  Filled 2019-11-29: qty 1

## 2019-11-29 MED ORDER — PROCHLORPERAZINE MALEATE 10 MG PO TABS
10.0000 mg | ORAL_TABLET | Freq: Once | ORAL | Status: AC
  Administered 2019-11-29: 10 mg via ORAL

## 2019-11-29 NOTE — Patient Instructions (Signed)
[facility] Discharge Instructions for Patients Receiving Chemotherapy  Today you received the following chemotherapy agents   To help prevent nausea and vomiting after your treatment, we encourage you to take your nausea medication   If you develop nausea and vomiting that is not controlled by your nausea medication, call the clinic.   BELOW ARE SYMPTOMS THAT SHOULD BE REPORTED IMMEDIATELY:  *FEVER GREATER THAN 100.5 F  *CHILLS WITH OR WITHOUT FEVER  NAUSEA AND VOMITING THAT IS NOT CONTROLLED WITH YOUR NAUSEA MEDICATION  *UNUSUAL SHORTNESS OF BREATH  *UNUSUAL BRUISING OR BLEEDING  TENDERNESS IN MOUTH AND THROAT WITH OR WITHOUT PRESENCE OF ULCERS  *URINARY PROBLEMS  *BOWEL PROBLEMS  UNUSUAL RASH Items with * indicate a potential emergency and should be followed up as soon as possible.  Feel free to call the clinic should you have any questions or concerns. The clinic phone number is [phone].  Please show the CHEMO ALERT CARD at check-in to the Emergency Department and triage nurse.   

## 2019-11-29 NOTE — Progress Notes (Signed)
Patient presents today for Velcade injection. Vital signs within parameters for treatment. Labs pending. Patient denies pain today. Patient states she has more energy since her Feraheme infusion last week. Patient states she is taking her Revlimid as prescribed with no side effects or complaints.   Labs within parameters for treatment.   Treatment given today per MD orders. Tolerated  without adverse affects. Vital signs stable. No complaints at this time. Discharged from clinic ambulatory in stable condition. Alert and oriented x 3. F/U with [facility] as scheduled.

## 2019-12-06 ENCOUNTER — Other Ambulatory Visit: Payer: Self-pay

## 2019-12-06 ENCOUNTER — Inpatient Hospital Stay (HOSPITAL_COMMUNITY): Payer: Medicare Other

## 2019-12-06 ENCOUNTER — Encounter (HOSPITAL_COMMUNITY): Payer: Self-pay

## 2019-12-06 VITALS — BP 119/47 | HR 73 | Temp 96.8°F | Resp 16

## 2019-12-06 DIAGNOSIS — E538 Deficiency of other specified B group vitamins: Secondary | ICD-10-CM | POA: Diagnosis not present

## 2019-12-06 DIAGNOSIS — D509 Iron deficiency anemia, unspecified: Secondary | ICD-10-CM | POA: Diagnosis not present

## 2019-12-06 DIAGNOSIS — C9000 Multiple myeloma not having achieved remission: Secondary | ICD-10-CM | POA: Diagnosis not present

## 2019-12-06 DIAGNOSIS — Z5112 Encounter for antineoplastic immunotherapy: Secondary | ICD-10-CM | POA: Diagnosis not present

## 2019-12-06 DIAGNOSIS — Z23 Encounter for immunization: Secondary | ICD-10-CM | POA: Diagnosis not present

## 2019-12-06 LAB — COMPREHENSIVE METABOLIC PANEL
ALT: 26 U/L (ref 0–44)
AST: 28 U/L (ref 15–41)
Albumin: 3.8 g/dL (ref 3.5–5.0)
Alkaline Phosphatase: 98 U/L (ref 38–126)
Anion gap: 11 (ref 5–15)
BUN: 18 mg/dL (ref 8–23)
CO2: 29 mmol/L (ref 22–32)
Calcium: 9.4 mg/dL (ref 8.9–10.3)
Chloride: 96 mmol/L — ABNORMAL LOW (ref 98–111)
Creatinine, Ser: 0.81 mg/dL (ref 0.44–1.00)
GFR calc Af Amer: >60 mL/min (ref >60)
GFR calc non Af Amer: >60 mL/min (ref >60)
Glucose, Bld: 151 mg/dL — ABNORMAL HIGH (ref 70–99)
Potassium: 3.6 mmol/L (ref 3.5–5.1)
Sodium: 136 mmol/L (ref 135–145)
Total Bilirubin: 0.3 mg/dL (ref 0.3–1.2)
Total Protein: 6.8 g/dL (ref 6.5–8.1)

## 2019-12-06 LAB — CBC WITH DIFFERENTIAL/PLATELET
Abs Immature Granulocytes: 0.11 10*3/uL — ABNORMAL HIGH (ref 0.00–0.07)
Basophils Absolute: 0 10*3/uL (ref 0.0–0.1)
Basophils Relative: 0 %
Eosinophils Absolute: 0 10*3/uL (ref 0.0–0.5)
Eosinophils Relative: 0 %
HCT: 31.9 % — ABNORMAL LOW (ref 36.0–46.0)
Hemoglobin: 10.3 g/dL — ABNORMAL LOW (ref 12.0–15.0)
Immature Granulocytes: 2 %
Lymphocytes Relative: 8 %
Lymphs Abs: 0.4 10*3/uL — ABNORMAL LOW (ref 0.7–4.0)
MCH: 31.1 pg (ref 26.0–34.0)
MCHC: 32.3 g/dL (ref 30.0–36.0)
MCV: 96.4 fL (ref 80.0–100.0)
Monocytes Absolute: 0 10*3/uL — ABNORMAL LOW (ref 0.1–1.0)
Monocytes Relative: 1 %
Neutro Abs: 4.5 10*3/uL (ref 1.7–7.7)
Neutrophils Relative %: 89 %
Platelets: 247 10*3/uL (ref 150–400)
RBC: 3.31 MIL/uL — ABNORMAL LOW (ref 3.87–5.11)
RDW: 18.6 % — ABNORMAL HIGH (ref 11.5–15.5)
WBC: 5.1 10*3/uL (ref 4.0–10.5)
nRBC: 0 % (ref 0.0–0.2)

## 2019-12-06 LAB — LACTATE DEHYDROGENASE: LDH: 136 U/L (ref 98–192)

## 2019-12-06 MED ORDER — INFLUENZA VAC A&B SA ADJ QUAD 0.5 ML IM PRSY
0.5000 mL | PREFILLED_SYRINGE | Freq: Once | INTRAMUSCULAR | Status: AC
  Administered 2019-12-06: 0.5 mL via INTRAMUSCULAR

## 2019-12-06 MED ORDER — PROCHLORPERAZINE MALEATE 10 MG PO TABS
ORAL_TABLET | ORAL | Status: AC
  Filled 2019-12-06: qty 1

## 2019-12-06 MED ORDER — BORTEZOMIB CHEMO SQ INJECTION 3.5 MG (2.5MG/ML)
1.3000 mg/m2 | Freq: Once | INTRAMUSCULAR | Status: AC
  Administered 2019-12-06: 2.25 mg via SUBCUTANEOUS
  Filled 2019-12-06: qty 0.9

## 2019-12-06 MED ORDER — PROCHLORPERAZINE MALEATE 10 MG PO TABS
10.0000 mg | ORAL_TABLET | Freq: Once | ORAL | Status: AC
  Administered 2019-12-06: 10 mg via ORAL

## 2019-12-06 MED ORDER — INFLUENZA VAC A&B SA ADJ QUAD 0.5 ML IM PRSY
PREFILLED_SYRINGE | INTRAMUSCULAR | Status: AC
  Filled 2019-12-06: qty 0.5

## 2019-12-06 NOTE — Progress Notes (Signed)
Labs within parameter for treatment.  Okay to proceed with treatment.   Pt tolerated velcade today without incidence. Stable to be discharged ambulatory.   Courtney Arnold presents today for injection per the provider's orders.  Flu vaccine administration without incident; injection site WNL; see MAR for injection details.  Patient tolerated procedure well and without incident.  No questions or complaints noted at this time.

## 2019-12-06 NOTE — Patient Instructions (Signed)
[facility] Discharge Instructions for Patients Receiving Chemotherapy  Today you received the following chemotherapy agents   To help prevent nausea and vomiting after your treatment, we encourage you to take your nausea medication   If you develop nausea and vomiting that is not controlled by your nausea medication, call the clinic.   BELOW ARE SYMPTOMS THAT SHOULD BE REPORTED IMMEDIATELY:  *FEVER GREATER THAN 100.5 F  *CHILLS WITH OR WITHOUT FEVER  NAUSEA AND VOMITING THAT IS NOT CONTROLLED WITH YOUR NAUSEA MEDICATION  *UNUSUAL SHORTNESS OF BREATH  *UNUSUAL BRUISING OR BLEEDING  TENDERNESS IN MOUTH AND THROAT WITH OR WITHOUT PRESENCE OF ULCERS  *URINARY PROBLEMS  *BOWEL PROBLEMS  UNUSUAL RASH Items with * indicate a potential emergency and should be followed up as soon as possible.  Feel free to call the clinic should you have any questions or concerns. The clinic phone number is [phone].  Please show the CHEMO ALERT CARD at check-in to the Emergency Department and triage nurse.   

## 2019-12-07 LAB — PROTEIN ELECTROPHORESIS, SERUM
A/G Ratio: 1.3 (ref 0.7–1.7)
Albumin ELP: 3.5 g/dL (ref 2.9–4.4)
Alpha-1-Globulin: 0.3 g/dL (ref 0.0–0.4)
Alpha-2-Globulin: 0.8 g/dL (ref 0.4–1.0)
Beta Globulin: 1 g/dL (ref 0.7–1.3)
Gamma Globulin: 0.6 g/dL (ref 0.4–1.8)
Globulin, Total: 2.8 g/dL (ref 2.2–3.9)
Total Protein ELP: 6.3 g/dL (ref 6.0–8.5)

## 2019-12-07 LAB — KAPPA/LAMBDA LIGHT CHAINS
Kappa free light chain: 27.5 mg/L — ABNORMAL HIGH (ref 3.3–19.4)
Kappa, lambda light chain ratio: 1.95 — ABNORMAL HIGH (ref 0.26–1.65)
Lambda free light chains: 14.1 mg/L (ref 5.7–26.3)

## 2019-12-08 LAB — IMMUNOFIXATION ELECTROPHORESIS
IgA: 111 mg/dL (ref 64–422)
IgG (Immunoglobin G), Serum: 632 mg/dL (ref 586–1602)
IgM (Immunoglobulin M), Srm: 43 mg/dL (ref 26–217)
Total Protein ELP: 6.5 g/dL (ref 6.0–8.5)

## 2019-12-12 ENCOUNTER — Other Ambulatory Visit (HOSPITAL_COMMUNITY): Payer: Self-pay

## 2019-12-12 DIAGNOSIS — C9000 Multiple myeloma not having achieved remission: Secondary | ICD-10-CM

## 2019-12-12 MED ORDER — LENALIDOMIDE 15 MG PO CAPS: ORAL_CAPSULE | ORAL | 0 refills | Status: DC

## 2019-12-12 NOTE — Telephone Encounter (Signed)
Chart reviewed. Revlimid refilled per Dr. Delton Coombes.

## 2019-12-13 ENCOUNTER — Inpatient Hospital Stay (HOSPITAL_COMMUNITY): Payer: Medicare Other | Attending: Hematology

## 2019-12-13 ENCOUNTER — Other Ambulatory Visit: Payer: Self-pay

## 2019-12-13 ENCOUNTER — Inpatient Hospital Stay (HOSPITAL_COMMUNITY): Payer: Medicare Other

## 2019-12-13 ENCOUNTER — Inpatient Hospital Stay (HOSPITAL_BASED_OUTPATIENT_CLINIC_OR_DEPARTMENT_OTHER): Payer: Medicare Other | Admitting: Oncology

## 2019-12-13 VITALS — BP 106/56 | HR 67 | Temp 96.9°F | Resp 18 | Wt 139.6 lb

## 2019-12-13 DIAGNOSIS — Z5112 Encounter for antineoplastic immunotherapy: Secondary | ICD-10-CM | POA: Insufficient documentation

## 2019-12-13 DIAGNOSIS — Z79899 Other long term (current) drug therapy: Secondary | ICD-10-CM | POA: Insufficient documentation

## 2019-12-13 DIAGNOSIS — C9000 Multiple myeloma not having achieved remission: Secondary | ICD-10-CM

## 2019-12-13 DIAGNOSIS — D509 Iron deficiency anemia, unspecified: Secondary | ICD-10-CM | POA: Diagnosis not present

## 2019-12-13 LAB — COMPREHENSIVE METABOLIC PANEL
ALT: 32 U/L (ref 0–44)
AST: 19 U/L (ref 15–41)
Albumin: 3.6 g/dL (ref 3.5–5.0)
Alkaline Phosphatase: 90 U/L (ref 38–126)
Anion gap: 10 (ref 5–15)
BUN: 20 mg/dL (ref 8–23)
CO2: 27 mmol/L (ref 22–32)
Calcium: 8.4 mg/dL — ABNORMAL LOW (ref 8.9–10.3)
Chloride: 97 mmol/L — ABNORMAL LOW (ref 98–111)
Creatinine, Ser: 0.78 mg/dL (ref 0.44–1.00)
GFR calc non Af Amer: >60 mL/min (ref >60)
Glucose, Bld: 147 mg/dL — ABNORMAL HIGH (ref 70–99)
Potassium: 4 mmol/L (ref 3.5–5.1)
Sodium: 134 mmol/L — ABNORMAL LOW (ref 135–145)
Total Bilirubin: 0.6 mg/dL (ref 0.3–1.2)
Total Protein: 6.5 g/dL (ref 6.5–8.1)

## 2019-12-13 LAB — CBC WITH DIFFERENTIAL/PLATELET
Abs Immature Granulocytes: 0.05 10*3/uL (ref 0.00–0.07)
Basophils Absolute: 0 10*3/uL (ref 0.0–0.1)
Basophils Relative: 0 %
Eosinophils Absolute: 0 10*3/uL (ref 0.0–0.5)
Eosinophils Relative: 0 %
HCT: 31.7 % — ABNORMAL LOW (ref 36.0–46.0)
Hemoglobin: 10.2 g/dL — ABNORMAL LOW (ref 12.0–15.0)
Immature Granulocytes: 1 %
Lymphocytes Relative: 8 %
Lymphs Abs: 0.4 10*3/uL — ABNORMAL LOW (ref 0.7–4.0)
MCH: 31 pg (ref 26.0–34.0)
MCHC: 32.2 g/dL (ref 30.0–36.0)
MCV: 96.4 fL (ref 80.0–100.0)
Monocytes Absolute: 0.1 10*3/uL (ref 0.1–1.0)
Monocytes Relative: 2 %
Neutro Abs: 4.2 10*3/uL (ref 1.7–7.7)
Neutrophils Relative %: 89 %
Platelets: 201 10*3/uL (ref 150–400)
RBC: 3.29 MIL/uL — ABNORMAL LOW (ref 3.87–5.11)
RDW: 18.1 % — ABNORMAL HIGH (ref 11.5–15.5)
WBC: 4.8 10*3/uL (ref 4.0–10.5)
nRBC: 0 % (ref 0.0–0.2)

## 2019-12-13 MED ORDER — PROCHLORPERAZINE MALEATE 10 MG PO TABS
10.0000 mg | ORAL_TABLET | Freq: Once | ORAL | Status: AC
  Administered 2019-12-13: 10 mg via ORAL
  Filled 2019-12-13: qty 1

## 2019-12-13 MED ORDER — BORTEZOMIB CHEMO SQ INJECTION 3.5 MG (2.5MG/ML)
1.3000 mg/m2 | Freq: Once | INTRAMUSCULAR | Status: AC
  Administered 2019-12-13: 2.25 mg via SUBCUTANEOUS
  Filled 2019-12-13: qty 0.9

## 2019-12-13 NOTE — Progress Notes (Signed)
[facility] [place], [place] [address]   CLINIC:  Medical Oncology/Hematology  PCP:  [name], MD [address] / [place] [address] [phone]   REASON FOR VISIT:  Follow-up for IgA kappa plasma cell myeloma  PRIOR THERAPY: None  NGS Results: Not done  CURRENT THERAPY: Bortezomib weekly; Revlimid  BRIEF ONCOLOGIC HISTORY:  Oncology History  Multiple myeloma not having achieved remission ([place])  06/07/2019 Initial Diagnosis   Multiple myeloma not having achieved remission ([place])   07/04/2019 -  Chemotherapy   The patient had dexamethasone (DECADRON) 4 MG tablet, 1 of 1 cycle, Start date: 06/21/2019, End date: -- lenalidomide (REVLIMID) 15 MG capsule, 1 of 1 cycle, Start date: 11/13/2019, End date: -- bortezomib SQ (VELCADE) chemo injection 2.25 mg, 1.3 mg/m2 = 2.25 mg, Subcutaneous,  Once, 8 of 8 cycles Administration: 2.25 mg (07/04/2019), 2.25 mg (07/11/2019), 2.25 mg (07/26/2019), 2.25 mg (08/02/2019), 2.25 mg (08/09/2019), 2.25 mg (08/16/2019), 2.25 mg (08/23/2019), 2.25 mg (08/30/2019), 2.25 mg (09/06/2019), 2.25 mg (09/13/2019), 2.25 mg (09/20/2019), 2.25 mg (09/27/2019), 2.25 mg (10/04/2019), 2.25 mg (10/11/2019), 2.25 mg (10/18/2019), 2.25 mg (10/25/2019), 2.25 mg (11/01/2019), 2.25 mg (11/08/2019), 2.25 mg (11/15/2019), 2.25 mg (11/22/2019), 2.25 mg (11/29/2019), 2.25 mg (12/06/2019)  for chemotherapy treatment.      CANCER STAGING: Cancer Staging No matching staging information was found for the patient.  INTERVAL HISTORY:  Courtney Arnold, a 79 y.o. female, returns for routine follow-up and consideration for next cycle of chemotherapy. [name] was last seen on 11/08/2019.  Due for Courtney #8 of cycle #8 of bortezomib today.   Overall, she tells me she has been feeling pretty well. She is tolerating the treatments well and denies having any numbness or tingling. She is taking Revlimid 2 weeks on and 1 week off. She takes Decadron 5 tablets on the days  of her treatment. She denies having any new back aches or pains or jaw pain. She ambulates with a cane.  Her appetite has improved and she is gaining weight.   Overall, she feels ready for next cycle of chemo today.    REVIEW OF SYSTEMS:  Review of Systems  Constitutional: Positive for fatigue. Negative for appetite change, fever and unexpected weight change.  HENT:   Negative for nosebleeds, sore throat and trouble swallowing.   Eyes: Negative.   Respiratory: Negative.  Negative for cough, shortness of breath and wheezing.   Cardiovascular: Negative.  Negative for chest pain and leg swelling.  Gastrointestinal: Negative for abdominal pain, blood in stool, constipation, diarrhea, nausea and vomiting.  Endocrine: Negative.   Genitourinary: Negative.  Negative for bladder incontinence, hematuria and nocturia.   Musculoskeletal: Negative.  Negative for back pain and flank pain.  Skin: Negative.   Neurological: Negative.  Negative for dizziness, headaches, light-headedness and numbness.  Hematological: Negative.   Psychiatric/Behavioral: Negative.  Negative for confusion. The patient is not nervous/anxious.     PAST MEDICAL/SURGICAL HISTORY:  Past Medical History:  Diagnosis Date  . Atrial fibrillation ([place]) 2011   Postop, spontaneous conversion to normal sinus after one hour  . Compression fracture 07/24/09   T12; kyphoplasty  . History of echocardiogram 5/11   EF 65%  . Hypertension   . Thyroid disease   . Tobacco abuse    Past Surgical History:  Procedure Laterality Date  . BACK SURGERY    . BACK SURGERY  06/06/2015  . BREAST EXCISIONAL BIOPSY Left    50 years ago  benign  .  CHOLECYSTECTOMY N/A 04/25/2015   Procedure: LAPAROSCOPIC CHOLECYSTECTOMY WITH INTRAOPERATIVE CHOLANGIOGRAM;  Surgeon: Mickeal Skinner, MD;  Location: [facility];  Service: General;  Laterality: N/A;  . COLONOSCOPY N/A 11/26/2015   Procedure: COLONOSCOPY;  Surgeon: Daneil Dolin, MD;  Location[facility];  Service: Endoscopy;  Laterality: N/A;  7:30 am  . ERCP N/A 04/14/2015   Procedure: ENDOSCOPIC RETROGRADE CHOLANGIOPANCREATOGRAPHY (ERCP) Biliary Sphincterotomy, 10x7 stent placement Dilated bilary system just not well seen;  Surgeon: Rogene Houston, MD;  Location: [facility];  Service: Endoscopy;  Laterality: N/A;  . ERCP N/A 06/12/2015   Procedure: ENDOSCOPIC RETROGRADE CHOLANGIOPANCREATOGRAPHY (ERCP);  Surgeon: Rogene Houston, MD;  [facility];  Service: Endoscopy;  Laterality: N/A;  . ESOPHAGOGASTRODUODENOSCOPY N/A 06/12/2015   Procedure: DIAGNOSTIC ESOPHAGOGASTRODUODENOSCOPY (EGD);  Surgeon: Rogene Houston, MD;  [facility];  Service: Endoscopy;  Laterality: N/A;  . FEMUR IM NAIL Right 05/11/2019   Procedure: RETROGRADE INTRAMEDULLARY NAIL FEMORAL;  Surgeon: Shona Needles, MD;  Location: [facility];  Service: Orthopedics;  Laterality: Right;  . STENT REMOVAL  06/12/2015   Procedure: STENT REMOVAL ;  Surgeon: Rogene Houston, MD;  [facility];  Service: Endoscopy;;    SOCIAL HISTORY:  Social History   Socioeconomic History  . Marital status: Widowed    Spouse name: Not on file  . Number of children: 2  . Years of education: Not on file  . Highest education level: Not on file  Occupational History  . Occupation: retired  Tobacco Use  . Smoking status: Former Smoker    Packs/Courtney: 0.15    Years: 20.00    Pack years: 3.00    Quit date: 03/08/2013    Years since quitting: 6.7  . Smokeless tobacco: Never Used  Vaping Use  . Vaping Use: Never used  Substance and Sexual Activity  . Alcohol use: No    Alcohol/week: 0.0 standard drinks  . Drug use: No  . Sexual activity: Not Currently  Other Topics Concern  . Not on file  Social History Narrative   Active in gardens and does yard work.   Social Determinants of Health   Financial Resource Strain: Low Risk   . Difficulty of Paying Living Expenses: Not hard at all  Food Insecurity: No Food Insecurity    . Worried About Charity fundraiser in the Last Year: Never true  . Ran Out of Food in the Last Year: Never true  Transportation Needs: No Transportation Needs  . Lack of Transportation (Medical): No  . Lack of Transportation (Non-Medical): No  Physical Activity: Inactive  . Days of Exercise per Week: 0 days  . Minutes of Exercise per Session: 0 min  Stress: No Stress Concern Present  . Feeling of Stress : Not at all  Social Connections:   . Frequency of Communication with Friends and Family: Not on file  . Frequency of Social Gatherings with Friends and Family: Not on file  . Attends Religious Services: Not on file  . Active Member of Clubs or Organizations: Not on file  . Attends Archivist Meetings: Not on file  . Marital Status: Not on file  Intimate Partner Violence: Not At Risk  . Fear of Current or Ex-Partner: No  . Emotionally Abused: No  . Physically Abused: No  . Sexually Abused: No    FAMILY HISTORY:  Family History  Problem Relation Age of Onset  . Heart attack Father   .  Stroke Father   . Breast cancer Sister   . Thyroid disease Neg Hx   . Colon cancer Neg Hx     CURRENT MEDICATIONS:  Current Outpatient Medications  Medication Sig Dispense Refill  . acyclovir (ZOVIRAX) 400 MG tablet TAKE (1) TABLET BY MOUTH TWICE DAILY. 60 tablet 6  . aspirin EC 81 MG tablet Take 81 mg by mouth daily.    . bortezomib IV (VELCADE) 3.5 MG injection Inject 1.3 mg/m2 into the vein once a week. Courtney 1, 8, 15 every 21 days    . carboxymethylcellulose (REFRESH PLUS) 0.5 % SOLN Place 1 drop into both eyes in the morning, at noon, in the evening, and at bedtime.    . Cholecalciferol (VITAMIN D) 125 MCG (5000 UT) CAPS Take 5,000 Units by mouth daily. 90 capsule 1  . cyclobenzaprine (FLEXERIL) 5 MG tablet Take 1 tablet (5 mg total) by mouth 3 (three) times daily as needed for muscle spasms. 30 tablet 0  . dexamethasone (DECADRON) 4 MG tablet Take 5 tablets (20 mg) on days 1, 8,  and 15 of chemo. Repeat every 21 days. 30 tablet 3  . lenalidomide (REVLIMID) 15 MG capsule Take one capsule daily on days 1-14 every 21 days. 14 capsule 0  . levothyroxine (SYNTHROID) 100 MCG tablet Take 1 tablet (100 mcg total) by mouth daily. 30 tablet 0  . NON FORMULARY Diet -Regular    . prochlorperazine (COMPAZINE) 10 MG tablet Take 1 tablet (10 mg total) by mouth every 6 (six) hours as needed (Nausea or vomiting). 30 tablet 1   No current facility-administered medications for this visit.    ALLERGIES:  No Known Allergies  PHYSICAL EXAM:  Performance status (ECOG): 1 - Symptomatic but completely ambulatory  There were no vitals filed for this visit. Wt Readings from Last 3 Encounters:  11/29/19 137 lb 12.8 oz (62.5 kg)  11/08/19 136 lb 11.2 oz (62 kg)  10/11/19 134 lb 14.4 oz (61.2 kg)   Physical Exam Vitals reviewed.  Constitutional:      Appearance: Normal appearance.  Neurological:     General: No focal deficit present.     Mental Status: She is alert and oriented to person, place, and time.  Psychiatric:        Mood and Affect: Mood normal.        Behavior: Behavior normal.     LABORATORY DATA:  I have reviewed the labs as listed.  CBC Latest Ref Rng & Units 12/06/2019 11/29/2019 11/22/2019  WBC 4.0 - 10.5 K/uL 5.1 3.8(L) 4.7  Hemoglobin 12.0 - 15.0 g/dL 10.3(L) 9.7(L) 9.4(L)  Hematocrit 36 - 46 % 31.9(L) 31.2(L) 30.1(L)  Platelets 150 - 400 K/uL 247 230 217   CMP Latest Ref Rng & Units 12/06/2019 11/29/2019 11/22/2019  Glucose 70 - 99 mg/dL 151(H) 155(H) 154(H)  BUN 8 - 23 mg/dL _0 Creatinine 0.44 - 1.00 mg/dL 0.81 0.73 0.81  Sodium 135 - 145 mmol/L 136 136 133(L)  Potassium 3.5 - 5.1 mmol/L 3.6 4.2 4.0  Chloride 98 - 111 mmol/L 96(L) 98 95(L)  CO2 22 - 32 mmol/L _1 Calcium 8.9 - 10.3 mg/dL 9.4 9.9 9.4  Total Protein 6.5 - 8.1 g/dL 6.8 6.8 6.7  Total Bilirubin 0.3 - 1.2 mg/dL 0.3 0.4 0.6  Alkaline Phos 38 - 126 U/L 98 89 90  AST 15 - 41 U/L _2 ALT 0 - 44 U/L 26 23 38  Lab Results  Component Value Date   TOTALPROTELP 6.3 12/06/2019   TOTALPROTELP 6.5 12/06/2019   ALBUMINELP 3.5 12/06/2019   A1GS 0.3 12/06/2019   A2GS 0.8 12/06/2019   BETS 1.0 12/06/2019   GAMS 0.6 12/06/2019   MSPIKE Not Observed 12/06/2019   SPEI Comment 12/06/2019    Lab Results  Component Value Date   KPAFRELGTCHN 27.5 (H) 12/06/2019   LAMBDASER 14.1 12/06/2019   KAPLAMBRATIO 1.95 (H) 12/06/2019    DIAGNOSTIC IMAGING:  I have independently reviewed the scans and discussed with the patient. No results found.   ASSESSMENT:  1. IgA kappa plasma cell myeloma: -IgA kappa plasma cell myeloma diagnosed on right sacral bone biopsy on 05/17/2019. SPEP did not show M spike. Immunofixation shows IgA kappa. Kappa light chains 42.8, lambda light chains at 9.9, ratio 4.32. Beta-2 microglobulin 3.7. -PET scan on 06/04/2019 showed expansile lytic lesion involving the right sacral wing. Lytic lesion involving T10 vertebral body. Left seventh rib lesion. Increased uptake in a pathological fracture involving distal aspect of the right femur. 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 -RVD started on 06/26/2019.Revlimid 15 mg 2 weeks on/1 week off.   PLAN:  1. IgA kappa plasma cell myeloma: -Myeloma labs from 11/01/2019 shows M spike is 0.  Kappa light chain is 31.7 with ratio of 2.09.  CBC shows normal white count and platelets.  LFTs were normal. -She is tolerating Revlimid 15 mg 2 weeks on 1 week off very well.  She is also taking dexamethasone 20 mg weekly.  No side effects from Velcade so far. -She will continue with treatments.  We will see her back in 4 weeks for follow-up.  If her myeloma panel has been negative for 2-3 times, will consider maintenance Revlimid.  2. Back pain: -This has completely improved since the start of therapy.  3. Normocytic anemia: -Hemoglobin  today is 10.2 (9.8). Improved with IV iron given on 11/22/2019.  -Daughter is interested in a trial iron tablets.  Recommend iron tablets 3 times a week.  4. ID prophylaxis: -Continue acyclovir twice daily.  Continue aspirin 81 mg daily.  5.  Myeloma bone disease: -Denosumab started on 10/18/2019.  She is tolerating very well.  Continue calcium twice daily.   Orders placed this encounter:  No orders of the defined types were placed in this encounter.  Faythe Casa, NP 12/13/2019 2:38 PM [place] [phone]   Greater than 50% was spent in counseling and coordination of care with this patient including but not limited to discussion of the relevant topics above (See A&P) including, but not limited to diagnosis and management of acute and chronic medical conditions.

## 2019-12-13 NOTE — Progress Notes (Signed)
Labs reviewed with Faythe Casa NP today. Will proceed with treatment as planned.   Treatment given per orders. Patient tolerated it well without problems. Vitals stable and discharged home from clinic ambulatory in stable condition.  Follow up as scheduled.

## 2019-12-20 ENCOUNTER — Encounter (HOSPITAL_COMMUNITY): Payer: Self-pay

## 2019-12-20 ENCOUNTER — Inpatient Hospital Stay (HOSPITAL_COMMUNITY): Payer: Medicare Other

## 2019-12-20 ENCOUNTER — Other Ambulatory Visit: Payer: Self-pay

## 2019-12-20 VITALS — BP 113/52 | HR 74 | Temp 97.0°F | Resp 18 | Wt 139.8 lb

## 2019-12-20 DIAGNOSIS — Z79899 Other long term (current) drug therapy: Secondary | ICD-10-CM | POA: Diagnosis not present

## 2019-12-20 DIAGNOSIS — C9000 Multiple myeloma not having achieved remission: Secondary | ICD-10-CM | POA: Diagnosis not present

## 2019-12-20 DIAGNOSIS — Z5112 Encounter for antineoplastic immunotherapy: Secondary | ICD-10-CM | POA: Diagnosis not present

## 2019-12-20 LAB — CBC WITH DIFFERENTIAL/PLATELET
Abs Immature Granulocytes: 0.05 10*3/uL (ref 0.00–0.07)
Basophils Absolute: 0 10*3/uL (ref 0.0–0.1)
Basophils Relative: 1 %
Eosinophils Absolute: 0 10*3/uL (ref 0.0–0.5)
Eosinophils Relative: 1 %
HCT: 29.6 % — ABNORMAL LOW (ref 36.0–46.0)
Hemoglobin: 9.7 g/dL — ABNORMAL LOW (ref 12.0–15.0)
Immature Granulocytes: 2 %
Lymphocytes Relative: 12 %
Lymphs Abs: 0.4 10*3/uL — ABNORMAL LOW (ref 0.7–4.0)
MCH: 31.4 pg (ref 26.0–34.0)
MCHC: 32.8 g/dL (ref 30.0–36.0)
MCV: 95.8 fL (ref 80.0–100.0)
Monocytes Absolute: 0.1 10*3/uL (ref 0.1–1.0)
Monocytes Relative: 4 %
Neutro Abs: 2.8 10*3/uL (ref 1.7–7.7)
Neutrophils Relative %: 80 %
Platelets: 199 10*3/uL (ref 150–400)
RBC: 3.09 MIL/uL — ABNORMAL LOW (ref 3.87–5.11)
RDW: 18.1 % — ABNORMAL HIGH (ref 11.5–15.5)
WBC: 3.4 10*3/uL — ABNORMAL LOW (ref 4.0–10.5)
nRBC: 0 % (ref 0.0–0.2)

## 2019-12-20 LAB — COMPREHENSIVE METABOLIC PANEL
ALT: 20 U/L (ref 0–44)
AST: 15 U/L (ref 15–41)
Albumin: 3.6 g/dL (ref 3.5–5.0)
Alkaline Phosphatase: 81 U/L (ref 38–126)
Anion gap: 7 (ref 5–15)
BUN: 17 mg/dL (ref 8–23)
CO2: 29 mmol/L (ref 22–32)
Calcium: 8.6 mg/dL — ABNORMAL LOW (ref 8.9–10.3)
Chloride: 99 mmol/L (ref 98–111)
Creatinine, Ser: 0.69 mg/dL (ref 0.44–1.00)
GFR, Estimated: >60 mL/min (ref >60)
Glucose, Bld: 135 mg/dL — ABNORMAL HIGH (ref 70–99)
Potassium: 3.5 mmol/L (ref 3.5–5.1)
Sodium: 135 mmol/L (ref 135–145)
Total Bilirubin: 0.6 mg/dL (ref 0.3–1.2)
Total Protein: 6.6 g/dL (ref 6.5–8.1)

## 2019-12-20 MED ORDER — PROCHLORPERAZINE MALEATE 10 MG PO TABS
10.0000 mg | ORAL_TABLET | Freq: Once | ORAL | Status: AC
  Administered 2019-12-20: 10 mg via ORAL
  Filled 2019-12-20: qty 1

## 2019-12-20 MED ORDER — DENOSUMAB 120 MG/1.7ML ~~LOC~~ SOLN
120.0000 mg | Freq: Once | SUBCUTANEOUS | Status: AC
  Administered 2019-12-20: 120 mg via SUBCUTANEOUS

## 2019-12-20 MED ORDER — BORTEZOMIB CHEMO SQ INJECTION 3.5 MG (2.5MG/ML)
1.3000 mg/m2 | Freq: Once | INTRAMUSCULAR | Status: AC
  Administered 2019-12-20: 2.25 mg via SUBCUTANEOUS
  Filled 2019-12-20: qty 0.9

## 2019-12-20 NOTE — Progress Notes (Signed)
Courtney Arnold tolerated Velcade and Xgeva injections well without complaints or incident. Calcium 8.6 today and pt denied any tooth or jaw pain and no recent or future dental visits prior to administering the Xgeva injection. All lab results reviewed prior to administering the Velcade injection as well. VSS Pt continues to take her Calcium and Revlimid PO as prescribed without issues. Pt discharged self ambulatory in satisfactory condition accompanied by her daughter

## 2019-12-20 NOTE — Patient Instructions (Signed)
[facility] Discharge Instructions for Patients Receiving Chemotherapy   Beginning January 23rd 2017 lab work for the Cancer Center will be done in the  Main lab at [facility] on 1st floor. If you have a lab appointment with the Cancer Center please come in thru the  Main Entrance and check in at the main information desk   Today you received the following chemotherapy agents Velcade injection as well as Xgeva injection. Follow-up as scheduled  To help prevent nausea and vomiting after your treatment, we encourage you to take your nausea medication   If you develop nausea and vomiting, or diarrhea that is not controlled by your medication, call the clinic.  The clinic phone number is [phone]. Office hours are Monday-Friday 8:30am-5:00pm.  BELOW ARE SYMPTOMS THAT SHOULD BE REPORTED IMMEDIATELY:  *FEVER GREATER THAN 101.0 F  *CHILLS WITH OR WITHOUT FEVER  NAUSEA AND VOMITING THAT IS NOT CONTROLLED WITH YOUR NAUSEA MEDICATION  *UNUSUAL SHORTNESS OF BREATH  *UNUSUAL BRUISING OR BLEEDING  TENDERNESS IN MOUTH AND THROAT WITH OR WITHOUT PRESENCE OF ULCERS  *URINARY PROBLEMS  *BOWEL PROBLEMS  UNUSUAL RASH Items with * indicate a potential emergency and should be followed up as soon as possible. If you have an emergency after office hours please contact your primary care physician or go to the nearest emergency department.  Please call the clinic during office hours if you have any questions or concerns.   You may also contact the Patient Navigator at [phone] should you have any questions or need assistance in obtaining follow up care.      Resources For Cancer Patients and their Caregivers ? American Cancer Society: Can assist with transportation, wigs, general needs, runs Look Good Feel Better.        1-888-227-6333 ? Cancer Care: Provides financial assistance, online support groups, medication/co-pay assistance.  1-800-813-HOPE  (4673) ? [facility] Assists [place] cancer patients and their families through emotional , educational and financial support.  336-427-4357 ? [place] DSS Where to apply for food stamps, Medicaid and utility assistance. 336-342-1394 ? RCATS: Transportation to medical appointments. 336-347-2287 ? Social Security Administration: May apply for disability if have a Stage IV cancer. 336-342-7796 1-800-772-1213 ? [place] Aging, Disability and Transit Services: Assists with nutrition, care and transit needs. 336-349-2343         

## 2019-12-26 ENCOUNTER — Other Ambulatory Visit (HOSPITAL_COMMUNITY): Payer: Self-pay

## 2019-12-26 DIAGNOSIS — D509 Iron deficiency anemia, unspecified: Secondary | ICD-10-CM

## 2019-12-26 DIAGNOSIS — C9000 Multiple myeloma not having achieved remission: Secondary | ICD-10-CM

## 2019-12-27 ENCOUNTER — Inpatient Hospital Stay (HOSPITAL_COMMUNITY): Payer: Medicare Other

## 2019-12-27 ENCOUNTER — Other Ambulatory Visit: Payer: Self-pay

## 2019-12-27 ENCOUNTER — Encounter (HOSPITAL_COMMUNITY): Payer: Self-pay

## 2019-12-27 ENCOUNTER — Other Ambulatory Visit (HOSPITAL_COMMUNITY): Payer: Self-pay

## 2019-12-27 VITALS — BP 110/69 | HR 73 | Temp 96.9°F | Resp 18 | Wt 140.0 lb

## 2019-12-27 DIAGNOSIS — C9000 Multiple myeloma not having achieved remission: Secondary | ICD-10-CM

## 2019-12-27 DIAGNOSIS — Z79899 Other long term (current) drug therapy: Secondary | ICD-10-CM | POA: Diagnosis not present

## 2019-12-27 DIAGNOSIS — Z5112 Encounter for antineoplastic immunotherapy: Secondary | ICD-10-CM | POA: Diagnosis not present

## 2019-12-27 DIAGNOSIS — D509 Iron deficiency anemia, unspecified: Secondary | ICD-10-CM

## 2019-12-27 LAB — CBC WITH DIFFERENTIAL/PLATELET
Band Neutrophils: 3 %
Basophils Absolute: 0 10*3/uL (ref 0.0–0.1)
Basophils Relative: 0 %
Eosinophils Absolute: 0 10*3/uL (ref 0.0–0.5)
Eosinophils Relative: 0 %
HCT: 29.3 % — ABNORMAL LOW (ref 36.0–46.0)
Hemoglobin: 9.3 g/dL — ABNORMAL LOW (ref 12.0–15.0)
Lymphocytes Relative: 7 %
Lymphs Abs: 0.4 10*3/uL — ABNORMAL LOW (ref 0.7–4.0)
MCH: 31.4 pg (ref 26.0–34.0)
MCHC: 31.7 g/dL (ref 30.0–36.0)
MCV: 99 fL (ref 80.0–100.0)
Metamyelocytes Relative: 1 %
Monocytes Absolute: 0.1 10*3/uL (ref 0.1–1.0)
Monocytes Relative: 1 %
Myelocytes: 1 %
Neutro Abs: 4.8 10*3/uL (ref 1.7–7.7)
Neutrophils Relative %: 87 %
Platelets: 253 10*3/uL (ref 150–400)
RBC: 2.96 MIL/uL — ABNORMAL LOW (ref 3.87–5.11)
RDW: 18 % — ABNORMAL HIGH (ref 11.5–15.5)
WBC: 5.3 10*3/uL (ref 4.0–10.5)
nRBC: 0 % (ref 0.0–0.2)

## 2019-12-27 LAB — COMPREHENSIVE METABOLIC PANEL
ALT: 17 U/L (ref 0–44)
AST: 20 U/L (ref 15–41)
Albumin: 3.8 g/dL (ref 3.5–5.0)
Alkaline Phosphatase: 72 U/L (ref 38–126)
Anion gap: 10 (ref 5–15)
BUN: 14 mg/dL (ref 8–23)
CO2: 29 mmol/L (ref 22–32)
Calcium: 9.9 mg/dL (ref 8.9–10.3)
Chloride: 97 mmol/L — ABNORMAL LOW (ref 98–111)
Creatinine, Ser: 0.78 mg/dL (ref 0.44–1.00)
GFR, Estimated: >60 mL/min (ref >60)
Glucose, Bld: 139 mg/dL — ABNORMAL HIGH (ref 70–99)
Potassium: 3.7 mmol/L (ref 3.5–5.1)
Sodium: 136 mmol/L (ref 135–145)
Total Bilirubin: 0.7 mg/dL (ref 0.3–1.2)
Total Protein: 6.6 g/dL (ref 6.5–8.1)

## 2019-12-27 MED ORDER — PROCHLORPERAZINE MALEATE 10 MG PO TABS
10.0000 mg | ORAL_TABLET | Freq: Once | ORAL | Status: AC
  Administered 2019-12-27: 10 mg via ORAL

## 2019-12-27 MED ORDER — CYANOCOBALAMIN 1000 MCG/ML IJ SOLN
1000.0000 ug | Freq: Once | INTRAMUSCULAR | Status: AC
  Administered 2019-12-27: 1000 ug via INTRAMUSCULAR

## 2019-12-27 MED ORDER — BORTEZOMIB CHEMO SQ INJECTION 3.5 MG (2.5MG/ML)
1.3000 mg/m2 | Freq: Once | INTRAMUSCULAR | Status: AC
  Administered 2019-12-27: 2.25 mg via SUBCUTANEOUS
  Filled 2019-12-27: qty 0.9

## 2019-12-27 MED ORDER — LENALIDOMIDE 15 MG PO CAPS: ORAL_CAPSULE | ORAL | 0 refills | Status: DC

## 2019-12-27 MED ORDER — CYANOCOBALAMIN 1000 MCG/ML IJ SOLN
INTRAMUSCULAR | Status: AC
  Filled 2019-12-27: qty 1

## 2019-12-27 MED ORDER — PROCHLORPERAZINE MALEATE 10 MG PO TABS
ORAL_TABLET | ORAL | Status: AC
  Filled 2019-12-27: qty 1

## 2019-12-27 NOTE — Telephone Encounter (Signed)
Chart reviewed. Revlimid refilled per Dr. Delton Coombes.

## 2019-12-27 NOTE — Progress Notes (Signed)
Courtney Arnold presents today for injection per the provider's orders.  B12 and Velcade administration without incident; injection site WNL; see MAR for injection details.  Patient tolerated procedure well and without incident.  No questions or complaints noted at this time.  Discharged ambulatory in stable condition.

## 2019-12-28 ENCOUNTER — Encounter: Payer: Self-pay | Admitting: *Deleted

## 2019-12-28 ENCOUNTER — Other Ambulatory Visit: Payer: Self-pay | Admitting: *Deleted

## 2019-12-28 NOTE — Patient Outreach (Addendum)
[facility]) Care Management  12/28/2019  LANEE CHAIN 30-Apr-1940 [id number]   [facility] case closure for complex care skilled nursing facility discharged patient   Referral Date:06/05/19 Referral Source:[place] Crane, [facility] post acute care coordinator Date of Admission:05/21/19 Reason for consult Please assign to [facility] RN CM   Diagnoses A FIB, HTN, femur fracture   Please assign to [facility] RN CM. Discharged from [facility] on Saturday 06/02/19 prior to writer's outreach. Lives alone. Home health arranged.   Insurance:[employer]  Last admission at [facility] 05/10/19- 05/21/19 for fall displaced spiral fracture of shaft of right femur 05/03/19 ED for abdominal pain  05/01/19 ED for abdominal pain,anemia, thrombocytopenia   Outreach successful Spoke with her daughter [name] with the permission of Mrs Frankye Schwegel is able to verify HIPAA identifier In April 2021 Vivien Rota had denied need of [facility] services but had agreed to outreach to [facility] RN CM if services  needed and had agreed to allow [facility] RN CM to outreach after 180 days  Today Vivien Rota confirms again the Mrs Bradway is doing "very well" and states [facility] RN CM services are not needed She does agree and is welcomed to outreach to [facility] RN CM if needs arise   Plans [facility] Case closure as no needs are identified case closure letters to pt and pcp [facility] CM Care Plan Problem One     Most Recent Value  Care Plan Problem One knowledge deficit of home care for right femur fracture,myeloma, atrial fibrillation  Role Documenting the Problem One Care Management Telephonic Coordinator  Care Plan for Problem One Active  [facility] Long Term Goal  over the next 180 days patient will be able to verbalize with outreach interventions to manage femur fracture and myeloma at  home  [place] Goal Start Date 06/14/19  [facility] Long Term Goal Met Date 12/28/19  [facility] CM Short Term Goal #1  over the next 31 days patient will follow up with  primary care MD as evidence by verbalization of completed visit   [facility] CM Short Term Goal #1 Start Date 06/14/19  [facility] CM Short Term Goal #1 Met Date 06/28/19       Joelene Millin L. Lavina Hamman, RN, BSN, [place] Coordinator Office number [phone] Main [facility] number [phone] Fax number [phone]

## 2020-01-03 ENCOUNTER — Inpatient Hospital Stay (HOSPITAL_COMMUNITY): Payer: Medicare Other

## 2020-01-03 ENCOUNTER — Encounter (HOSPITAL_COMMUNITY): Payer: Self-pay

## 2020-01-03 ENCOUNTER — Other Ambulatory Visit: Payer: Self-pay

## 2020-01-03 VITALS — BP 110/51 | HR 73 | Temp 96.8°F | Resp 18

## 2020-01-03 DIAGNOSIS — C9000 Multiple myeloma not having achieved remission: Secondary | ICD-10-CM | POA: Diagnosis not present

## 2020-01-03 DIAGNOSIS — Z5112 Encounter for antineoplastic immunotherapy: Secondary | ICD-10-CM | POA: Diagnosis not present

## 2020-01-03 DIAGNOSIS — Z79899 Other long term (current) drug therapy: Secondary | ICD-10-CM | POA: Diagnosis not present

## 2020-01-03 LAB — CBC WITH DIFFERENTIAL/PLATELET
Abs Immature Granulocytes: 0.03 10*3/uL (ref 0.00–0.07)
Basophils Absolute: 0 10*3/uL (ref 0.0–0.1)
Basophils Relative: 0 %
Eosinophils Absolute: 0 10*3/uL (ref 0.0–0.5)
Eosinophils Relative: 0 %
HCT: 27 % — ABNORMAL LOW (ref 36.0–46.0)
Hemoglobin: 8.4 g/dL — ABNORMAL LOW (ref 12.0–15.0)
Immature Granulocytes: 1 %
Lymphocytes Relative: 7 %
Lymphs Abs: 0.3 10*3/uL — ABNORMAL LOW (ref 0.7–4.0)
MCH: 31.2 pg (ref 26.0–34.0)
MCHC: 31.1 g/dL (ref 30.0–36.0)
MCV: 100.4 fL — ABNORMAL HIGH (ref 80.0–100.0)
Monocytes Absolute: 0.1 10*3/uL (ref 0.1–1.0)
Monocytes Relative: 2 %
Neutro Abs: 3.5 10*3/uL (ref 1.7–7.7)
Neutrophils Relative %: 90 %
Platelets: 183 10*3/uL (ref 150–400)
RBC: 2.69 MIL/uL — ABNORMAL LOW (ref 3.87–5.11)
RDW: 17.5 % — ABNORMAL HIGH (ref 11.5–15.5)
WBC: 4 10*3/uL (ref 4.0–10.5)
nRBC: 0 % (ref 0.0–0.2)

## 2020-01-03 LAB — COMPREHENSIVE METABOLIC PANEL
ALT: 27 U/L (ref 0–44)
AST: 22 U/L (ref 15–41)
Albumin: 3.5 g/dL (ref 3.5–5.0)
Alkaline Phosphatase: 69 U/L (ref 38–126)
Anion gap: 9 (ref 5–15)
BUN: 20 mg/dL (ref 8–23)
CO2: 26 mmol/L (ref 22–32)
Calcium: 8.6 mg/dL — ABNORMAL LOW (ref 8.9–10.3)
Chloride: 100 mmol/L (ref 98–111)
Creatinine, Ser: 0.77 mg/dL (ref 0.44–1.00)
GFR, Estimated: >60 mL/min (ref >60)
Glucose, Bld: 154 mg/dL — ABNORMAL HIGH (ref 70–99)
Potassium: 3.6 mmol/L (ref 3.5–5.1)
Sodium: 135 mmol/L (ref 135–145)
Total Bilirubin: 0.5 mg/dL (ref 0.3–1.2)
Total Protein: 6.2 g/dL — ABNORMAL LOW (ref 6.5–8.1)

## 2020-01-03 MED ORDER — PROCHLORPERAZINE MALEATE 10 MG PO TABS
10.0000 mg | ORAL_TABLET | Freq: Once | ORAL | Status: AC
  Administered 2020-01-03: 10 mg via ORAL

## 2020-01-03 MED ORDER — PROCHLORPERAZINE MALEATE 10 MG PO TABS
ORAL_TABLET | ORAL | Status: AC
  Filled 2020-01-03: qty 1

## 2020-01-03 MED ORDER — BORTEZOMIB CHEMO SQ INJECTION 3.5 MG (2.5MG/ML)
1.3000 mg/m2 | Freq: Once | INTRAMUSCULAR | Status: AC
  Administered 2020-01-03: 2.25 mg via SUBCUTANEOUS
  Filled 2020-01-03: qty 0.9

## 2020-01-03 NOTE — Progress Notes (Signed)
Courtney Arnold tolerated Velcade injection well without complaints or incident. Labs reviewed prior to administering this medication. VSS Pt discharged self ambulatory in satisfactory condition accompanied by her daughter

## 2020-01-03 NOTE — Patient Instructions (Signed)
[facility] Discharge Instructions for Patients Receiving Chemotherapy   Beginning January 23rd 2017 lab work for the [facility] will be done in the  Main lab at [facility] on 1st floor. If you have a lab appointment with the [facility] please come in thru the  Main Entrance and check in at the main information desk   Today you received the following chemotherapy agents Velcade injection. Follow-up as scheduled  To help prevent nausea and vomiting after your treatment, we encourage you to take your nausea medication   If you develop nausea and vomiting, or diarrhea that is not controlled by your medication, call the clinic.  The clinic phone number is [phone]. Office hours are Monday-Friday 8:30am-5:00pm.  BELOW ARE SYMPTOMS THAT SHOULD BE REPORTED IMMEDIATELY:  *FEVER GREATER THAN 101.0 F  *CHILLS WITH OR WITHOUT FEVER  NAUSEA AND VOMITING THAT IS NOT CONTROLLED WITH YOUR NAUSEA MEDICATION  *UNUSUAL SHORTNESS OF BREATH  *UNUSUAL BRUISING OR BLEEDING  TENDERNESS IN MOUTH AND THROAT WITH OR WITHOUT PRESENCE OF ULCERS  *URINARY PROBLEMS  *BOWEL PROBLEMS  UNUSUAL RASH Items with * indicate a potential emergency and should be followed up as soon as possible. If you have an emergency after office hours please contact your primary care physician or go to the nearest emergency department.  Please call the clinic during office hours if you have any questions or concerns.   You may also contact the Patient Navigator at [phone] should you have any questions or need assistance in obtaining follow up care.      Resources For Cancer Patients and their Caregivers ? American Cancer Society: Can assist with transportation, wigs, general needs, runs Look Good Feel Better.        (843)451-1526 ? Cancer Care: Provides financial assistance, online support groups, medication/co-pay assistance.  1-800-813-HOPE 940-734-8018) ? [facility] Assists [place] cancer patients and their families through emotional , educational and financial support.  (229)817-8201 ? [place] DSS Where to apply for food stamps, Medicaid and utility assistance. (813)113-1778 ? RCATS: Transportation to medical appointments. (667) 724-7168 ? Social Security Administration: May apply for disability if have a Stage IV cancer. (206)187-6812 501-066-9769 ? [employer], Disability and Transit Services: Assists with nutrition, care and transit needs. [phone]

## 2020-01-10 ENCOUNTER — Other Ambulatory Visit: Payer: Self-pay

## 2020-01-10 ENCOUNTER — Inpatient Hospital Stay (HOSPITAL_COMMUNITY): Payer: Medicare Other | Attending: Hematology

## 2020-01-10 ENCOUNTER — Inpatient Hospital Stay (HOSPITAL_BASED_OUTPATIENT_CLINIC_OR_DEPARTMENT_OTHER): Payer: Medicare Other | Admitting: Hematology

## 2020-01-10 ENCOUNTER — Inpatient Hospital Stay (HOSPITAL_COMMUNITY): Payer: Medicare Other

## 2020-01-10 VITALS — BP 131/74 | HR 72 | Temp 96.9°F | Resp 17 | Wt 141.1 lb

## 2020-01-10 DIAGNOSIS — Z5112 Encounter for antineoplastic immunotherapy: Secondary | ICD-10-CM | POA: Diagnosis not present

## 2020-01-10 DIAGNOSIS — C9000 Multiple myeloma not having achieved remission: Secondary | ICD-10-CM

## 2020-01-10 LAB — CBC WITH DIFFERENTIAL/PLATELET
Abs Immature Granulocytes: 0.03 10*3/uL (ref 0.00–0.07)
Basophils Absolute: 0 10*3/uL (ref 0.0–0.1)
Basophils Relative: 1 %
Eosinophils Absolute: 0 10*3/uL (ref 0.0–0.5)
Eosinophils Relative: 0 %
HCT: 29.1 % — ABNORMAL LOW (ref 36.0–46.0)
Hemoglobin: 9.2 g/dL — ABNORMAL LOW (ref 12.0–15.0)
Immature Granulocytes: 1 %
Lymphocytes Relative: 11 %
Lymphs Abs: 0.4 10*3/uL — ABNORMAL LOW (ref 0.7–4.0)
MCH: 31.6 pg (ref 26.0–34.0)
MCHC: 31.6 g/dL (ref 30.0–36.0)
MCV: 100 fL (ref 80.0–100.0)
Monocytes Absolute: 0.1 10*3/uL (ref 0.1–1.0)
Monocytes Relative: 2 %
Neutro Abs: 2.8 10*3/uL (ref 1.7–7.7)
Neutrophils Relative %: 85 %
Platelets: 204 10*3/uL (ref 150–400)
RBC: 2.91 MIL/uL — ABNORMAL LOW (ref 3.87–5.11)
RDW: 17.5 % — ABNORMAL HIGH (ref 11.5–15.5)
WBC: 3.3 10*3/uL — ABNORMAL LOW (ref 4.0–10.5)
nRBC: 0 % (ref 0.0–0.2)

## 2020-01-10 LAB — COMPREHENSIVE METABOLIC PANEL
ALT: 14 U/L (ref 0–44)
AST: 15 U/L (ref 15–41)
Albumin: 4 g/dL (ref 3.5–5.0)
Alkaline Phosphatase: 66 U/L (ref 38–126)
Anion gap: 8 (ref 5–15)
BUN: 19 mg/dL (ref 8–23)
CO2: 29 mmol/L (ref 22–32)
Calcium: 9.6 mg/dL (ref 8.9–10.3)
Chloride: 100 mmol/L (ref 98–111)
Creatinine, Ser: 0.75 mg/dL (ref 0.44–1.00)
GFR, Estimated: >60 mL/min (ref >60)
Glucose, Bld: 136 mg/dL — ABNORMAL HIGH (ref 70–99)
Potassium: 3.6 mmol/L (ref 3.5–5.1)
Sodium: 137 mmol/L (ref 135–145)
Total Bilirubin: 0.6 mg/dL (ref 0.3–1.2)
Total Protein: 6.9 g/dL (ref 6.5–8.1)

## 2020-01-10 LAB — LACTATE DEHYDROGENASE: LDH: 137 U/L (ref 98–192)

## 2020-01-10 MED ORDER — DEXAMETHASONE 4 MG PO TABS: ORAL_TABLET | ORAL | 3 refills | Status: DC

## 2020-01-10 MED ORDER — PROCHLORPERAZINE MALEATE 10 MG PO TABS
10.0000 mg | ORAL_TABLET | Freq: Once | ORAL | Status: AC
  Administered 2020-01-10: 10 mg via ORAL
  Filled 2020-01-10: qty 1

## 2020-01-10 MED ORDER — BORTEZOMIB CHEMO SQ INJECTION 3.5 MG (2.5MG/ML)
1.3000 mg/m2 | Freq: Once | INTRAMUSCULAR | Status: AC
  Administered 2020-01-10: 2.25 mg via SUBCUTANEOUS
  Filled 2020-01-10: qty 0.9

## 2020-01-10 NOTE — Progress Notes (Signed)
[place], [place] [address]   CLINIC:  Medical Oncology/Hematology  PCP:  [name], MD [address] / [place] [address] [phone]   REASON FOR VISIT:  Follow-up for IgA kappa plasma cell myeloma  PRIOR THERAPY: None  NGS Results: Not done  CURRENT THERAPY: Bortezomib weekly; Revlimid 2 weeks on, 1 week off  BRIEF ONCOLOGIC HISTORY:  Oncology History  Multiple myeloma not having achieved remission ([facility])  06/07/2019 Initial Diagnosis   Multiple myeloma not having achieved remission ([facility])   07/04/2019 -  Chemotherapy   The patient had dexamethasone (DECADRON) 4 MG tablet, 1 of 1 cycle, Start date: 06/21/2019, End date: -- lenalidomide (REVLIMID) 15 MG capsule, 1 of 1 cycle, Start date: 12/27/2019, End date: -- bortezomib SQ (VELCADE) chemo injection 2.25 mg, 1.3 mg/m2 = 2.25 mg, Subcutaneous,  Once, 9 of 10 cycles Administration: 2.25 mg (07/04/2019), 2.25 mg (07/11/2019), 2.25 mg (07/26/2019), 2.25 mg (08/02/2019), 2.25 mg (08/09/2019), 2.25 mg (08/16/2019), 2.25 mg (08/23/2019), 2.25 mg (08/30/2019), 2.25 mg (09/06/2019), 2.25 mg (09/13/2019), 2.25 mg (09/20/2019), 2.25 mg (09/27/2019), 2.25 mg (10/04/2019), 2.25 mg (10/11/2019), 2.25 mg (10/18/2019), 2.25 mg (10/25/2019), 2.25 mg (11/01/2019), 2.25 mg (11/08/2019), 2.25 mg (11/15/2019), 2.25 mg (11/22/2019), 2.25 mg (11/29/2019), 2.25 mg (12/06/2019), 2.25 mg (12/13/2019), 2.25 mg (12/20/2019), 2.25 mg (12/27/2019)  for chemotherapy treatment.      CANCER STAGING: Cancer Staging No matching staging information was found for the patient.  INTERVAL HISTORY:  Courtney Arnold, a 79 y.o. female, returns for routine follow-up and consideration for next cycle of chemotherapy. Courtney Arnold was last seen on 11/08/2019.  Due for day #15 of cycle #9 of bortezomib today.   Today she is accompanied by her daughter. Overall, she tells me she has been feeling pretty well. She is tolerating the Velcade and  Revlimid well and denies N/V/D/C. She takes Decadron 5 tablets when she takes Revlimid and 2.5 tablets on her off week. Her numbness and tingling has resolved and she no longer has any back pain and is working in her house and yard. She denies having cough, SOB, jaw pain or leg swelling.  She is inquiring about getting her COVID booster shot; she received Moderna COVID vaccine.  Overall, she feels ready for next cycle of chemo today.    REVIEW OF SYSTEMS:  Review of Systems  Constitutional: Negative for appetite change and fatigue.  Respiratory: Negative for cough and shortness of breath.   Cardiovascular: Negative for leg swelling.  Gastrointestinal: Negative for constipation, diarrhea, nausea and vomiting.  Musculoskeletal: Negative for back pain.  Neurological: Negative for numbness.  All other systems reviewed and are negative.   PAST MEDICAL/SURGICAL HISTORY:  Past Medical History:  Diagnosis Date  . Atrial fibrillation ([facility]) 2011   Postop, spontaneous conversion to normal sinus after one hour  . Compression fracture 07/24/09   T12; kyphoplasty  . History of echocardiogram 5/11   EF 65%  . Hypertension   . Thyroid disease   . Tobacco abuse    Past Surgical History:  Procedure Laterality Date  . BACK SURGERY    . BACK SURGERY  06/06/2015  . BREAST EXCISIONAL BIOPSY Left    50 years ago  benign  . CHOLECYSTECTOMY N/A 04/25/2015   Procedure: LAPAROSCOPIC CHOLECYSTECTOMY WITH INTRAOPERATIVE CHOLANGIOGRAM;  Surgeon: Mickeal Skinner, MD;  Location: [facility];  Service: General;  Laterality: N/A;  . COLONOSCOPY N/A 11/26/2015   Procedure: COLONOSCOPY;  Surgeon: Daneil Dolin, MD;  [facility];  Service: Endoscopy;  Laterality: N/A;  7:30 am  . ERCP N/A 04/14/2015   Procedure: ENDOSCOPIC RETROGRADE CHOLANGIOPANCREATOGRAPHY (ERCP) Biliary Sphincterotomy, 10x7 stent placement Dilated bilary system just not well seen;  Surgeon: Rogene Houston, MD;  Location: [facility];   Service: Endoscopy;  Laterality: N/A;  . ERCP N/A 06/12/2015   Procedure: ENDOSCOPIC RETROGRADE CHOLANGIOPANCREATOGRAPHY (ERCP);  Surgeon: Rogene Houston, MD;  [facility];  Service: Endoscopy;  Laterality: N/A;  . ESOPHAGOGASTRODUODENOSCOPY N/A 06/12/2015   Procedure: DIAGNOSTIC ESOPHAGOGASTRODUODENOSCOPY (EGD);  Surgeon: Rogene Houston, MD;  [facility];  Service: Endoscopy;  Laterality: N/A;  . FEMUR IM NAIL Right 05/11/2019   Procedure: RETROGRADE INTRAMEDULLARY NAIL FEMORAL;  Surgeon: Shona Needles, MD;  Location: [facility];  Service: Orthopedics;  Laterality: Right;  . STENT REMOVAL  06/12/2015   Procedure: STENT REMOVAL ;  Surgeon: Rogene Houston, MD;  [facility];  Service: Endoscopy;;    SOCIAL HISTORY:  Social History   Socioeconomic History  . Marital status: Widowed    Spouse name: Not on file  . Number of children: 2  . Years of education: Not on file  . Highest education level: Not on file  Occupational History  . Occupation: retired  Tobacco Use  . Smoking status: Former Smoker    Packs/day: 0.15    Years: 20.00    Pack years: 3.00    Quit date: 03/08/2013    Years since quitting: 6.8  . Smokeless tobacco: Never Used  Vaping Use  . Vaping Use: Never used  Substance and Sexual Activity  . Alcohol use: No    Alcohol/week: 0.0 standard drinks  . Drug use: No  . Sexual activity: Not Currently  Other Topics Concern  . Not on file  Social History Narrative   Active in gardens and does yard work.   Social Determinants of Health   Financial Resource Strain: Low Risk   . Difficulty of Paying Living Expenses: Not hard at all  Food Insecurity: No Food Insecurity  . Worried About Charity fundraiser in the Last Year: Never true  . Ran Out of Food in the Last Year: Never true  Transportation Needs: No Transportation Needs  . Lack of Transportation (Medical): No  . Lack of Transportation (Non-Medical): No  Physical Activity: Inactive  .  Days of Exercise per Week: 0 days  . Minutes of Exercise per Session: 0 min  Stress: No Stress Concern Present  . Feeling of Stress : Not at all  Social Connections:   . Frequency of Communication with Friends and Family: Not on file  . Frequency of Social Gatherings with Friends and Family: Not on file  . Attends Religious Services: Not on file  . Active Member of Clubs or Organizations: Not on file  . Attends Archivist Meetings: Not on file  . Marital Status: Not on file  Intimate Partner Violence: Not At Risk  . Fear of Current or Ex-Partner: No  . Emotionally Abused: No  . Physically Abused: No  . Sexually Abused: No    FAMILY HISTORY:  Family History  Problem Relation Age of Onset  . Heart attack Father   . Stroke Father   . Breast cancer Sister   . Thyroid disease Neg Hx   . Colon cancer Neg Hx     CURRENT MEDICATIONS:  Current Outpatient Medications  Medication Sig Dispense Refill  . acyclovir (ZOVIRAX) 400 MG tablet  TAKE (1) TABLET BY MOUTH TWICE DAILY. 60 tablet 6  . aspirin EC 81 MG tablet Take 81 mg by mouth daily.    . bortezomib IV (VELCADE) 3.5 MG injection Inject 1.3 mg/m2 into the vein once a week. Day 1, 8, 15 every 21 days    . carboxymethylcellulose (REFRESH PLUS) 0.5 % SOLN Place 1 drop into both eyes in the morning, at noon, in the evening, and at bedtime.    . Cholecalciferol (VITAMIN D) 125 MCG (5000 UT) CAPS Take 5,000 Units by mouth daily. 90 capsule 1  . cyclobenzaprine (FLEXERIL) 5 MG tablet Take 1 tablet (5 mg total) by mouth 3 (three) times daily as needed for muscle spasms. 30 tablet 0  . dexamethasone (DECADRON) 4 MG tablet Take 5 tablets (20 mg) on days 1, 8, and 15 of chemo. Repeat every 21 days. 30 tablet 3  . lenalidomide (REVLIMID) 15 MG capsule Take one capsule daily on days 1-14 every 21 days. 14 capsule 0  . levothyroxine (SYNTHROID) 100 MCG tablet Take 1 tablet (100 mcg total) by mouth daily. 30 tablet 0  . NON FORMULARY Diet  -Regular    . prochlorperazine (COMPAZINE) 10 MG tablet Take 1 tablet (10 mg total) by mouth every 6 (six) hours as needed (Nausea or vomiting). (Patient not taking: Reported on 01/10/2020) 30 tablet 1   No current facility-administered medications for this visit.    ALLERGIES:  No Known Allergies  PHYSICAL EXAM:  Performance status (ECOG): 1 - Symptomatic but completely ambulatory  Vitals:   01/10/20 1118  BP: 131/74  Pulse: 72  Resp: 17  Temp: (!) 96.9 F (36.1 C)  SpO2: 94%   Wt Readings from Last 3 Encounters:  01/10/20 141 lb 1.6 oz (64 kg)  12/27/19 140 lb (63.5 kg)  12/20/19 139 lb 12.4 oz (63.4 kg)   Physical Exam Vitals reviewed.  Constitutional:      Appearance: Normal appearance.  Cardiovascular:     Rate and Rhythm: Normal rate and regular rhythm.     Pulses: Normal pulses.     Heart sounds: Normal heart sounds.  Pulmonary:     Effort: Pulmonary effort is normal.     Breath sounds: Normal breath sounds.  Musculoskeletal:     Right lower leg: No edema.     Left lower leg: No edema.  Neurological:     General: No focal deficit present.     Mental Status: She is alert and oriented to person, place, and time.  Psychiatric:        Mood and Affect: Mood normal.        Behavior: Behavior normal.     LABORATORY DATA:  I have reviewed the labs as listed.  CBC Latest Ref Rng & Units 01/10/2020 01/03/2020 12/27/2019  WBC 4.0 - 10.5 K/uL 3.3(L) 4.0 5.3  Hemoglobin 12.0 - 15.0 g/dL 9.2(L) 8.4(L) 9.3(L)  Hematocrit 36 - 46 % 29.1(L) 27.0(L) 29.3(L)  Platelets 150 - 400 K/uL 204 183 253   CMP Latest Ref Rng & Units 01/10/2020 01/03/2020 12/27/2019  Glucose 70 - 99 mg/dL 136(H) 154(H) 139(H)  BUN 8 - 23 mg/dL 19 20 14   Creatinine 0.44 - 1.00 mg/dL 0.75 0.77 0.78  Sodium 135 - 145 mmol/L 137 135 136  Potassium 3.5 - 5.1 mmol/L 3.6 3.6 3.7  Chloride 98 - 111 mmol/L 100 100 97(L)  CO2 22 - 32 mmol/L 29 26 29   Calcium 8.9 - 10.3 mg/dL 9.6 8.6(L) 9.9  Total  Protein 6.5 - 8.1 g/dL 6.9 6.2(L) 6.6  Total Bilirubin 0.3 - 1.2 mg/dL 0.6 0.5 0.7  Alkaline Phos 38 - 126 U/L 66 69 72  AST 15 - 41 U/L 15 22 20   ALT 0 - 44 U/L 14 27 17    Lab Results  Component Value Date   LDH 137 01/10/2020   LDH 136 12/06/2019   LDH 162 10/04/2019   Lab Results  Component Value Date   TOTALPROTELP 6.3 12/06/2019   TOTALPROTELP 6.5 12/06/2019   ALBUMINELP 3.5 12/06/2019   A1GS 0.3 12/06/2019   A2GS 0.8 12/06/2019   BETS 1.0 12/06/2019   GAMS 0.6 12/06/2019   MSPIKE Not Observed 12/06/2019   SPEI Comment 12/06/2019    Lab Results  Component Value Date   KPAFRELGTCHN 27.5 (H) 12/06/2019   LAMBDASER 14.1 12/06/2019   KAPLAMBRATIO 1.95 (H) 12/06/2019    DIAGNOSTIC IMAGING:  I have independently reviewed the scans and discussed with the patient. No results found.   ASSESSMENT:  1. IgA kappa plasma cell myeloma: -IgA kappa plasma cell myeloma diagnosed on right sacral bone biopsy on 05/17/2019. SPEP did not show M spike. Immunofixation shows IgA kappa. Kappa light chains 42.8, lambda light chains at 9.9, ratio 4.32. Beta-2 microglobulin 3.7. -PET scan on 06/04/2019 showed expansile lytic lesion involving the right sacral wing. Lytic lesion involving T10 vertebral body. Left seventh rib lesion. Increased uptake in a pathological fracture involving distal aspect of the right femur. 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 -RVD started on 06/26/2019.Revlimid 15 mg 2 weeks on/1 week off.   PLAN:  1. IgA kappa plasma cell myeloma: -She is neuropathic side effects from Velcade. -Reviewed myeloma labs from 12/06/2019.  M spike is negative.  Free kappa light chains at 27.5 with ratio of 1.95.  Immunofixation is negative. -She has myeloma panel pending from today.  She was told to call us next week. -If the free light chain ratio is better, will consider discontinuing Velcade.  Will  consider increasing dose of Revlimid. -She will proceed with cycle 9-day 15 Velcade today. -RTC 4 weeks with repeat labs.  2. Back pain: -This is completely resolved after the start of therapy.  Not requiring any pain medication.  3. Normocytic anemia: -She has mild improvement after Feraheme infusion.  Today hemoglobin is 9.2.  This is partly from myelosuppression.  4. ID prophylaxis: -Continue acyclovir twice daily.  Continue aspirin 81 mg daily.  5. Myeloma bone disease: -Continue denosumab monthly.  Continue calcium twice daily.   Orders placed this encounter:  No orders of the defined types were placed in this encounter.    Derek Jack, MD [facility] [phone]   I, Milinda Antis, am acting as a scribe for Dr. Sanda Linger.  I, Derek Jack MD, have reviewed the above documentation for accuracy and completeness, and I agree with the above.

## 2020-01-10 NOTE — Patient Instructions (Signed)
[place] at [facility] Discharge Instructions  You were seen today by Dr. Delton Coombes. He went over your recent results. You received your treatment today. Based on your labs from today, we will see if she needs to continue getting weekly Velcade. Dr. Delton Coombes will see you back in 4 weeks for labs and follow up.   Thank you for choosing [place] at [facility] to provide your oncology and hematology care.  To afford each patient quality time with our provider, please arrive at least 15 minutes before your scheduled appointment time.   If you have a lab appointment with the [facility] please come in thru the Main Entrance and check in at the main information desk  You need to re-schedule your appointment should you arrive 10 or more minutes late.  We strive to give you quality time with our providers, and arriving late affects you and other patients whose appointments are after yours.  Also, if you no show three or more times for appointments you may be dismissed from the clinic at the providers discretion.     Again, thank you for choosing [facility].  Our hope is that these requests will decrease the amount of time that you wait before being seen by our physicians.       _____________________________________________________________  Should you have questions after your visit to [facility], please contact our office at [phone] between the hours of 8:00 a.m. and 4:30 p.m.  Voicemails left after 4:00 p.m. will not be returned until the following business day.  For prescription refill requests, have your pharmacy contact our office and allow 72 hours.    Cancer Center Support Programs:   > Cancer Support Group  2nd Tuesday of the month 1pm-2pm, Journey Room

## 2020-01-10 NOTE — Progress Notes (Signed)
1230 Labs reviewed with and pt seen by Dr. Delton Coombes and pt approved for Velcade injection today per MD                                  Courtney Arnold tolerated Velcade injection well without complaints or incident. Pt discharged self ambulatory in satisfactory condition accompanied by her daughter

## 2020-01-10 NOTE — Patient Instructions (Signed)
[facility] Discharge Instructions for Patients Receiving Chemotherapy   Beginning January 23rd 2017 lab work for the [facility] will be done in the  Main lab at [facility] on 1st floor. If you have a lab appointment with the [facility] please come in thru the  Main Entrance and check in at the main information desk   Today you received the following chemotherapy agents Velcade injection. Follow-up as scheduled  To help prevent nausea and vomiting after your treatment, we encourage you to take your nausea medication   If you develop nausea and vomiting, or diarrhea that is not controlled by your medication, call the clinic.  The clinic phone number is [phone]. Office hours are Monday-Friday 8:30am-5:00pm.  BELOW ARE SYMPTOMS THAT SHOULD BE REPORTED IMMEDIATELY:  *FEVER GREATER THAN 101.0 F  *CHILLS WITH OR WITHOUT FEVER  NAUSEA AND VOMITING THAT IS NOT CONTROLLED WITH YOUR NAUSEA MEDICATION  *UNUSUAL SHORTNESS OF BREATH  *UNUSUAL BRUISING OR BLEEDING  TENDERNESS IN MOUTH AND THROAT WITH OR WITHOUT PRESENCE OF ULCERS  *URINARY PROBLEMS  *BOWEL PROBLEMS  UNUSUAL RASH Items with * indicate a potential emergency and should be followed up as soon as possible. If you have an emergency after office hours please contact your primary care physician or go to the nearest emergency department.  Please call the clinic during office hours if you have any questions or concerns.   You may also contact the Patient Navigator at [phone] should you have any questions or need assistance in obtaining follow up care.      Resources For Cancer Patients and their Caregivers ? American Cancer Society: Can assist with transportation, wigs, general needs, runs Look Good Feel Better.        812-783-4289 ? Cancer Care: Provides financial assistance, online support groups, medication/co-pay assistance.  1-800-813-HOPE 925-728-9739) ? [facility] Assists [place] cancer patients and their families through emotional , educational and financial support.  918-545-3200 ? [place] DSS Where to apply for food stamps, Medicaid and utility assistance. 773-093-2742 ? [facility]: Transportation to medical appointments. (562)477-3010 ? Social Security Administration: May apply for disability if have a Stage IV cancer. 6093709151 319-295-2881 ? [employer], Disability and Transit Services: Assists with nutrition, care and transit needs. 760-371-9303

## 2020-01-10 NOTE — Progress Notes (Signed)
Confirmed that patient is taking revlimid as directed and denies any side effects.

## 2020-01-10 NOTE — Progress Notes (Signed)
Patient was assessed by Dr. Katragadda and labs have been reviewed.  Patient is okay to proceed with treatment today. Primary RN and pharmacy aware.   

## 2020-01-11 LAB — PROTEIN ELECTROPHORESIS, SERUM
A/G Ratio: 1.2 (ref 0.7–1.7)
Albumin ELP: 3.5 g/dL (ref 2.9–4.4)
Alpha-1-Globulin: 0.3 g/dL (ref 0.0–0.4)
Alpha-2-Globulin: 0.9 g/dL (ref 0.4–1.0)
Beta Globulin: 1 g/dL (ref 0.7–1.3)
Gamma Globulin: 0.6 g/dL (ref 0.4–1.8)
Globulin, Total: 2.9 g/dL (ref 2.2–3.9)
Total Protein ELP: 6.4 g/dL (ref 6.0–8.5)

## 2020-01-11 LAB — KAPPA/LAMBDA LIGHT CHAINS
Kappa free light chain: 23 mg/L — ABNORMAL HIGH (ref 3.3–19.4)
Kappa, lambda light chain ratio: 1.9 — ABNORMAL HIGH (ref 0.26–1.65)
Lambda free light chains: 12.1 mg/L (ref 5.7–26.3)

## 2020-01-14 DIAGNOSIS — Z23 Encounter for immunization: Secondary | ICD-10-CM | POA: Diagnosis not present

## 2020-01-17 ENCOUNTER — Other Ambulatory Visit (HOSPITAL_COMMUNITY): Payer: Self-pay

## 2020-01-17 DIAGNOSIS — C9000 Multiple myeloma not having achieved remission: Secondary | ICD-10-CM

## 2020-01-17 MED ORDER — LENALIDOMIDE 15 MG PO CAPS: ORAL_CAPSULE | ORAL | 0 refills | Status: DC

## 2020-01-17 NOTE — Telephone Encounter (Signed)
Chart reviewed. Revlimid refilled per Dr. Delton Coombes

## 2020-02-06 ENCOUNTER — Inpatient Hospital Stay (HOSPITAL_BASED_OUTPATIENT_CLINIC_OR_DEPARTMENT_OTHER): Payer: Medicare Other | Admitting: Hematology

## 2020-02-06 ENCOUNTER — Other Ambulatory Visit: Payer: Self-pay

## 2020-02-06 ENCOUNTER — Inpatient Hospital Stay (HOSPITAL_COMMUNITY): Payer: Medicare Other

## 2020-02-06 ENCOUNTER — Other Ambulatory Visit (HOSPITAL_COMMUNITY): Payer: Self-pay

## 2020-02-06 ENCOUNTER — Inpatient Hospital Stay (HOSPITAL_COMMUNITY): Payer: Medicare Other | Attending: Hematology

## 2020-02-06 VITALS — BP 118/63 | HR 71 | Temp 98.3°F | Resp 18 | Wt 139.5 lb

## 2020-02-06 DIAGNOSIS — C9000 Multiple myeloma not having achieved remission: Secondary | ICD-10-CM

## 2020-02-06 LAB — CBC WITH DIFFERENTIAL/PLATELET
Abs Immature Granulocytes: 0.07 10*3/uL (ref 0.00–0.07)
Basophils Absolute: 0 10*3/uL (ref 0.0–0.1)
Basophils Relative: 0 %
Eosinophils Absolute: 0 10*3/uL (ref 0.0–0.5)
Eosinophils Relative: 0 %
HCT: 29.2 % — ABNORMAL LOW (ref 36.0–46.0)
Hemoglobin: 9.1 g/dL — ABNORMAL LOW (ref 12.0–15.0)
Immature Granulocytes: 2 %
Lymphocytes Relative: 8 %
Lymphs Abs: 0.3 10*3/uL — ABNORMAL LOW (ref 0.7–4.0)
MCH: 31.4 pg (ref 26.0–34.0)
MCHC: 31.2 g/dL (ref 30.0–36.0)
MCV: 100.7 fL — ABNORMAL HIGH (ref 80.0–100.0)
Monocytes Absolute: 0 10*3/uL — ABNORMAL LOW (ref 0.1–1.0)
Monocytes Relative: 1 %
Neutro Abs: 3.9 10*3/uL (ref 1.7–7.7)
Neutrophils Relative %: 89 %
Platelets: 282 10*3/uL (ref 150–400)
RBC: 2.9 MIL/uL — ABNORMAL LOW (ref 3.87–5.11)
RDW: 16 % — ABNORMAL HIGH (ref 11.5–15.5)
WBC: 4.3 10*3/uL (ref 4.0–10.5)
nRBC: 0 % (ref 0.0–0.2)

## 2020-02-06 LAB — COMPREHENSIVE METABOLIC PANEL
ALT: 13 U/L (ref 0–44)
AST: 18 U/L (ref 15–41)
Albumin: 3.9 g/dL (ref 3.5–5.0)
Alkaline Phosphatase: 59 U/L (ref 38–126)
Anion gap: 8 (ref 5–15)
BUN: 16 mg/dL (ref 8–23)
CO2: 26 mmol/L (ref 22–32)
Calcium: 10.4 mg/dL — ABNORMAL HIGH (ref 8.9–10.3)
Chloride: 100 mmol/L (ref 98–111)
Creatinine, Ser: 0.67 mg/dL (ref 0.44–1.00)
GFR, Estimated: >60 mL/min (ref >60)
Glucose, Bld: 161 mg/dL — ABNORMAL HIGH (ref 70–99)
Potassium: 3.8 mmol/L (ref 3.5–5.1)
Sodium: 134 mmol/L — ABNORMAL LOW (ref 135–145)
Total Bilirubin: 0.6 mg/dL (ref 0.3–1.2)
Total Protein: 6.7 g/dL (ref 6.5–8.1)

## 2020-02-06 LAB — LACTATE DEHYDROGENASE: LDH: 138 U/L (ref 98–192)

## 2020-02-06 MED ORDER — DENOSUMAB 120 MG/1.7ML ~~LOC~~ SOLN
120.0000 mg | Freq: Once | SUBCUTANEOUS | Status: AC
  Administered 2020-02-06: 120 mg via SUBCUTANEOUS

## 2020-02-06 MED ORDER — DENOSUMAB 120 MG/1.7ML ~~LOC~~ SOLN
SUBCUTANEOUS | Status: AC
  Filled 2020-02-06: qty 1.7

## 2020-02-06 MED ORDER — LENALIDOMIDE 15 MG PO CAPS: ORAL_CAPSULE | ORAL | 0 refills | Status: DC

## 2020-02-06 NOTE — Progress Notes (Signed)
Velcade held today verbal order Dr. Delton Coombes.

## 2020-02-06 NOTE — Telephone Encounter (Signed)
Chart reviewed. Revlimid refilled per Dr. Delton Coombes

## 2020-02-06 NOTE — Progress Notes (Signed)
Give XGeva injection today. Verbal order from Dr. Rosana Fret RN. Calcium 10.4 today.

## 2020-02-06 NOTE — Patient Instructions (Signed)
[place] at [facility] Discharge Instructions  You were seen today by Dr. Delton Coombes. He went over your recent results. You received your Xgeva bone injection today; continue getting your injection monthly. Purchase vitamin B12 and take 1 mg daily. Dr. Delton Coombes will see you back in 2 months for labs and follow up.   Thank you for choosing [place] at [facility] to provide your oncology and hematology care.  To afford each patient quality time with our provider, please arrive at least 15 minutes before your scheduled appointment time.   If you have a lab appointment with the [facility] please come in thru the Main Entrance and check in at the main information desk  You need to re-schedule your appointment should you arrive 10 or more minutes late.  We strive to give you quality time with our providers, and arriving late affects you and other patients whose appointments are after yours.  Also, if you no show three or more times for appointments you may be dismissed from the clinic at the providers discretion.     Again, thank you for choosing [facility].  Our hope is that these requests will decrease the amount of time that you wait before being seen by our physicians.       _____________________________________________________________  Should you have questions after your visit to [facility], please contact our office at [phone] between the hours of 8:00 a.m. and 4:30 p.m.  Voicemails left after 4:00 p.m. will not be returned until the following business day.  For prescription refill requests, have your pharmacy contact our office and allow 72 hours.    Cancer Center Support Programs:   > Cancer Support Group  2nd Tuesday of the month 1pm-2pm, Journey Room

## 2020-02-06 NOTE — Progress Notes (Signed)
[place] [place], [place] [address]   CLINIC:  Medical Oncology/Hematology  PCP:  [name], [place] / [place] [address] [phone]   REASON FOR VISIT:  Follow-up for IgA kappa plasma cell myeloma  PRIOR THERAPY: None  NGS Results: Not done  CURRENT THERAPY: Velcade weekly; Revlimid 2 weeks on, 1 week off  BRIEF ONCOLOGIC HISTORY:  Oncology History  Multiple myeloma not having achieved remission ([place])  06/07/2019 Initial Diagnosis   Multiple myeloma not having achieved remission ([place])   07/04/2019 -  Chemotherapy   The patient had dexamethasone (DECADRON) 4 MG tablet, 1 of 1 cycle, Start date: 01/10/2020, End date: -- lenalidomide (REVLIMID) 15 MG capsule, 1 of 1 cycle, Start date: 01/17/2020, End date: -- bortezomib SQ (VELCADE) chemo injection 2.25 mg, 1.3 mg/m2 = 2.25 mg, Subcutaneous,  Once, 9 of 10 cycles Administration: 2.25 mg (07/04/2019), 2.25 mg (07/11/2019), 2.25 mg (07/26/2019), 2.25 mg (08/02/2019), 2.25 mg (08/09/2019), 2.25 mg (08/16/2019), 2.25 mg (08/23/2019), 2.25 mg (08/30/2019), 2.25 mg (09/06/2019), 2.25 mg (09/13/2019), 2.25 mg (09/20/2019), 2.25 mg (09/27/2019), 2.25 mg (10/04/2019), 2.25 mg (10/11/2019), 2.25 mg (10/18/2019), 2.25 mg (10/25/2019), 2.25 mg (11/01/2019), 2.25 mg (11/08/2019), 2.25 mg (11/15/2019), 2.25 mg (11/22/2019), 2.25 mg (11/29/2019), 2.25 mg (12/06/2019), 2.25 mg (12/13/2019), 2.25 mg (12/20/2019), 2.25 mg (12/27/2019), 2.25 mg (01/03/2020), 2.25 mg (01/10/2020)  for chemotherapy treatment.      CANCER STAGING: Cancer Staging No matching staging information was found for the patient.  INTERVAL HISTORY:  [name], a 79 y.o. female, returns for routine follow-up and consideration for next cycle of immunotherapy. Courtney Arnold was last seen on 01/10/2020.  Due for cycle #10 of Velcade today.   Today she is accompanied by her daughter. Overall, she tells me she has been feeling pretty well. She is  taking calcium chews BID. She is tolerating Velcade well and she continues taking Revlimid 2 weeks on and 1 week off. She takes 5 mg prednisone on her Revlimid weeks and 2.5 mg on her off-week. She denies having any N/V/D, numbness or tingling. Her appetite is excellent.  She will stop getting her Velcade injections and just continue Revlimid.   REVIEW OF SYSTEMS:  Review of Systems  Constitutional: Positive for appetite change (90%) and fatigue (90%).  Gastrointestinal: Negative for diarrhea, nausea and vomiting.  Neurological: Negative for numbness.  All other systems reviewed and are negative.   PAST MEDICAL/SURGICAL HISTORY:  Past Medical History:  Diagnosis Date  . Atrial fibrillation ([place]) 2011   Postop, spontaneous conversion to normal sinus after one hour  . Compression fracture 07/24/09   T12; kyphoplasty  . History of echocardiogram 5/11   EF 65%  . Hypertension   . Thyroid disease   . Tobacco abuse    Past Surgical History:  Procedure Laterality Date  . BACK SURGERY    . BACK SURGERY  06/06/2015  . BREAST EXCISIONAL BIOPSY Left    50 years ago  benign  . CHOLECYSTECTOMY N/A 04/25/2015   Procedure: LAPAROSCOPIC CHOLECYSTECTOMY WITH INTRAOPERATIVE CHOLANGIOGRAM;  Surgeon: Mickeal Skinner, MD;  [facility];  Service: General;  Laterality: N/A;  . COLONOSCOPY N/A 11/26/2015   Procedure: COLONOSCOPY;  Surgeon: Daneil Dolin, MD;  [facility];  Service: Endoscopy;  Laterality: N/A;  7:30 am  . ERCP N/A 04/14/2015   Procedure: ENDOSCOPIC RETROGRADE CHOLANGIOPANCREATOGRAPHY (ERCP) Biliary Sphincterotomy, 10x7 stent placement Dilated bilary system just not well seen;  Surgeon: Mechele Dawley  Laural Golden, MD;  [facility]: [facility];  Service: Endoscopy;  Laterality: N/A;  . ERCP N/A 06/12/2015   Procedure: ENDOSCOPIC RETROGRADE CHOLANGIOPANCREATOGRAPHY (ERCP);  Surgeon: Rogene Houston, MD;  [facility];  Service: Endoscopy;  Laterality: N/A;  .  ESOPHAGOGASTRODUODENOSCOPY N/A 06/12/2015   Procedure: DIAGNOSTIC ESOPHAGOGASTRODUODENOSCOPY (EGD);  Surgeon: Rogene Houston, MD;  [facility];  Service: Endoscopy;  Laterality: N/A;  . FEMUR IM NAIL Right 05/11/2019   Procedure: RETROGRADE INTRAMEDULLARY NAIL FEMORAL;  Surgeon: Shona Needles, MD;  Location: [facility];  Service: Orthopedics;  Laterality: Right;  . STENT REMOVAL  06/12/2015   Procedure: STENT REMOVAL ;  Surgeon: Rogene Houston, MD;  [facility];  Service: Endoscopy;;    SOCIAL HISTORY:  Social History   Socioeconomic History  . Marital status: Widowed    Spouse name: Not on file  . Number of children: 2  . Years of education: Not on file  . Highest education level: Not on file  Occupational History  . Occupation: retired  Tobacco Use  . Smoking status: Former Smoker    Packs/day: 0.15    Years: 20.00    Pack years: 3.00    Quit date: 03/08/2013    Years since quitting: 6.9  . Smokeless tobacco: Never Used  Vaping Use  . Vaping Use: Never used  Substance and Sexual Activity  . Alcohol use: No    Alcohol/week: 0.0 standard drinks  . Drug use: No  . Sexual activity: Not Currently  Other Topics Concern  . Not on file  Social History Narrative   Active in gardens and does yard work.   Social Determinants of Health   Financial Resource Strain: Low Risk   . Difficulty of Paying Living Expenses: Not hard at all  Food Insecurity: No Food Insecurity  . Worried About Charity fundraiser in the Last Year: Never true  . Ran Out of Food in the Last Year: Never true  Transportation Needs: No Transportation Needs  . Lack of Transportation (Medical): No  . Lack of Transportation (Non-Medical): No  Physical Activity: Inactive  . Days of Exercise per Week: 0 days  . Minutes of Exercise per Session: 0 min  Stress: No Stress Concern Present  . Feeling of Stress : Not at all  Social Connections: Moderately Isolated  . Frequency of Communication with  Friends and Family: More than three times a week  . Frequency of Social Gatherings with Friends and Family: More than three times a week  . Attends Religious Services: More than 4 times per year  . Active Member of Clubs or Organizations: No  . Attends Archivist Meetings: Never  . Marital Status: Widowed  Intimate Partner Violence: Not At Risk  . Fear of Current or Ex-Partner: No  . Emotionally Abused: No  . Physically Abused: No  . Sexually Abused: No    FAMILY HISTORY:  Family History  Problem Relation Age of Onset  . Heart attack Father   . Stroke Father   . Breast cancer Sister   . Thyroid disease Neg Hx   . Colon cancer Neg Hx     CURRENT MEDICATIONS:  Current Outpatient Medications  Medication Sig Dispense Refill  . acyclovir (ZOVIRAX) 400 MG tablet TAKE (1) TABLET BY MOUTH TWICE DAILY. 60 tablet 6  . aspirin EC 81 MG tablet Take 81 mg by mouth daily.    . bortezomib IV (VELCADE) 3.5 MG injection Inject 1.3 mg/m2 into  the vein once a week. Day 1, 8, 15 every 21 days    . carboxymethylcellulose (REFRESH PLUS) 0.5 % SOLN Place 1 drop into both eyes in the morning, at noon, in the evening, and at bedtime.    . Cholecalciferol (VITAMIN D) 125 MCG (5000 UT) CAPS Take 5,000 Units by mouth daily. 90 capsule 1  . cyclobenzaprine (FLEXERIL) 5 MG tablet Take 1 tablet (5 mg total) by mouth 3 (three) times daily as needed for muscle spasms. 30 tablet 0  . dexamethasone (DECADRON) 4 MG tablet Take 5 tablets (20 mg) on days 1, 8, and 15 of chemo. Repeat every 21 days. 30 tablet 3  . lenalidomide (REVLIMID) 15 MG capsule Take one capsule daily on days 1-14 every 21 days. 14 capsule 0  . levothyroxine (SYNTHROID) 100 MCG tablet Take 1 tablet (100 mcg total) by mouth daily. 30 tablet 0  . NON FORMULARY Diet -Regular    . prochlorperazine (COMPAZINE) 10 MG tablet Take 1 tablet (10 mg total) by mouth every 6 (six) hours as needed (Nausea or vomiting). 30 tablet 1   No current  facility-administered medications for this visit.   Facility-Administered Medications Ordered in Other Visits  Medication Dose Route Frequency Provider Last Rate Last Admin  . denosumab (XGEVA) injection 120 mg  120 mg Subcutaneous Once Derek Jack, MD        ALLERGIES:  No Known Allergies  PHYSICAL EXAM:  Performance status (ECOG): 1 - Symptomatic but completely ambulatory  Vitals:   02/06/20 1500  BP: 118/63  Pulse: 71  Resp: 18  Temp: 98.3 F (36.8 C)  SpO2: 99%   Wt Readings from Last 3 Encounters:  02/06/20 139 lb 8 oz (63.3 kg)  01/10/20 141 lb 1.6 oz (64 kg)  12/27/19 140 lb (63.5 kg)   Physical Exam Vitals reviewed.  Constitutional:      Appearance: Normal appearance.  Cardiovascular:     Rate and Rhythm: Normal rate and regular rhythm.     Pulses: Normal pulses.     Heart sounds: Normal heart sounds.  Pulmonary:     Effort: Pulmonary effort is normal.     Breath sounds: Normal breath sounds.  Musculoskeletal:     Right lower leg: No edema.     Left lower leg: No edema.  Neurological:     General: No focal deficit present.     Mental Status: She is alert and oriented to person, place, and time.  Psychiatric:        Mood and Affect: Mood normal.        Behavior: Behavior normal.     LABORATORY DATA:  I have reviewed the labs as listed.  CBC Latest Ref Rng & Units 02/06/2020 01/10/2020 01/03/2020  WBC 4.0 - 10.5 K/uL 4.3 3.3(L) 4.0  Hemoglobin 12.0 - 15.0 g/dL 9.1(L) 9.2(L) 8.4(L)  Hematocrit 36 - 46 % 29.2(L) 29.1(L) 27.0(L)  Platelets 150 - 400 K/uL 282 204 183   CMP Latest Ref Rng & Units 02/06/2020 01/10/2020 01/03/2020  Glucose 70 - 99 mg/dL 161(H) 136(H) 154(H)  BUN 8 - 23 mg/dL 16 19 20   Creatinine 0.44 - 1.00 mg/dL 0.67 0.75 0.77  Sodium 135 - 145 mmol/L 134(L) 137 135  Potassium 3.5 - 5.1 mmol/L 3.8 3.6 3.6  Chloride 98 - 111 mmol/L 100 100 100  CO2 22 - 32 mmol/L 26 29 26   Calcium 8.9 - 10.3 mg/dL 10.4(H) 9.6 8.6(L)  Total  Protein 6.5 - 8.1 g/dL 6.7 6.9  6.2(L)  Total Bilirubin 0.3 - 1.2 mg/dL 0.6 0.6 0.5  Alkaline Phos 38 - 126 U/L 59 66 69  AST 15 - 41 U/L 18 15 22   ALT 0 - 44 U/L 13 14 27    Lab Results  Component Value Date   LDH 138 02/06/2020   LDH 137 01/10/2020   LDH 136 12/06/2019   Lab Results  Component Value Date   TOTALPROTELP 6.4 01/10/2020   ALBUMINELP 3.5 01/10/2020   A1GS 0.3 01/10/2020   A2GS 0.9 01/10/2020   BETS 1.0 01/10/2020   GAMS 0.6 01/10/2020   MSPIKE Not Observed 01/10/2020   SPEI Comment 01/10/2020    Lab Results  Component Value Date   KPAFRELGTCHN 23.0 (H) 01/10/2020   LAMBDASER 12.1 01/10/2020   KAPLAMBRATIO 1.90 (H) 01/10/2020    DIAGNOSTIC IMAGING:  I have independently reviewed the scans and discussed with the patient. No results found.   ASSESSMENT:  1. IgA kappa plasma cell myeloma: -IgA kappa plasma cell myeloma diagnosed on right sacral bone biopsy on 05/17/2019. SPEP did not show M spike. Immunofixation shows IgA kappa. Kappa light chains 42.8, lambda light chains at 9.9, ratio 4.32. Beta-2 microglobulin 3.7. -PET scan on 06/04/2019 showed expansile lytic lesion involving the right sacral wing. Lytic lesion involving T10 vertebral body. Left seventh rib lesion. Increased uptake in a pathological fracture involving distal aspect of the right femur. 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 -RVD started on 06/26/2019.Revlimid 15 mg 2 weeks on/1 week off. -Velcade discontinued on 01/10/2020. -She is taking Revlimid 15 mg 2 weeks on 1 week off, with dexamethasone 20 mg on weeks of Revlimid and 10 mg while she is off Revlimid.   PLAN:  1. IgA kappa plasma cell myeloma: -Her energy levels are very good.  She denies any new onset pains. -Reviewed labs from 01/10/2020.  SPEP is negative.  Free light chain ratio improved to 1.9.  Kappa light chains continue to improve at 23. -Velcade was  stopped on 01/10/2020. -Continue Revlimid 15 mg 2 weeks on 1 week off.  Continue dexamethasone 20 mg weekly on the weeks of Revlimid and 10 mg on the week off Revlimid. -RTC 2 months with labs.  2. Back pain: -This is completely resolved after start of therapy.  3. Normocytic anemia: -Hemoglobin is 9.2.  She has received Feraheme.  We will closely monitor.  4. ID prophylaxis: -Continue acyclovir twice daily.  Continue aspirin 81 mg daily.  5. Myeloma bone disease: -Continue denosumab monthly.  Continue calcium twice daily.   Orders placed this encounter:  No orders of the defined types were placed in this encounter.    Derek Jack, MD [place] [phone]   I, Milinda Antis, am acting as a scribe for Dr. Sanda Linger.  I, Derek Jack MD, have reviewed the above documentation for accuracy and completeness, and I agree with the above.

## 2020-02-06 NOTE — Addendum Note (Signed)
Addended by: Benjiman Core D on: 02/06/2020 04:13 PM   Modules accepted: Orders

## 2020-02-07 LAB — KAPPA/LAMBDA LIGHT CHAINS
Kappa free light chain: 21.5 mg/L — ABNORMAL HIGH (ref 3.3–19.4)
Kappa, lambda light chain ratio: 1.75 — ABNORMAL HIGH (ref 0.26–1.65)
Lambda free light chains: 12.3 mg/L (ref 5.7–26.3)

## 2020-02-08 LAB — PROTEIN ELECTROPHORESIS, SERUM
A/G Ratio: 1.2 (ref 0.7–1.7)
Albumin ELP: 3.4 g/dL (ref 2.9–4.4)
Alpha-1-Globulin: 0.3 g/dL (ref 0.0–0.4)
Alpha-2-Globulin: 0.8 g/dL (ref 0.4–1.0)
Beta Globulin: 1 g/dL (ref 0.7–1.3)
Gamma Globulin: 0.6 g/dL (ref 0.4–1.8)
Globulin, Total: 2.8 g/dL (ref 2.2–3.9)
Total Protein ELP: 6.2 g/dL (ref 6.0–8.5)

## 2020-02-11 LAB — IMMUNOFIXATION ELECTROPHORESIS
IgA: 102 mg/dL (ref 64–422)
IgG (Immunoglobin G), Serum: 611 mg/dL (ref 586–1602)
IgM (Immunoglobulin M), Srm: 43 mg/dL (ref 26–217)
Total Protein ELP: 6.2 g/dL (ref 6.0–8.5)

## 2020-02-19 ENCOUNTER — Ambulatory Visit (INDEPENDENT_AMBULATORY_CARE_PROVIDER_SITE_OTHER): Payer: Medicare Other | Admitting: Endocrinology

## 2020-02-19 ENCOUNTER — Other Ambulatory Visit: Payer: Self-pay

## 2020-02-19 ENCOUNTER — Encounter: Payer: Self-pay | Admitting: Endocrinology

## 2020-02-19 ENCOUNTER — Ambulatory Visit: Payer: Medicare Other | Admitting: Endocrinology

## 2020-02-19 VITALS — BP 120/84 | HR 86 | Ht 60.5 in | Wt 141.0 lb

## 2020-02-19 DIAGNOSIS — E039 Hypothyroidism, unspecified: Secondary | ICD-10-CM | POA: Diagnosis not present

## 2020-02-19 LAB — TSH: TSH: 4.63 u[IU]/mL — ABNORMAL HIGH (ref 0.35–4.50)

## 2020-02-19 LAB — T4, FREE: Free T4: 1.13 ng/dL (ref 0.60–1.60)

## 2020-02-19 MED ORDER — LEVOTHYROXINE SODIUM 112 MCG PO TABS: 112.0000 ug | ORAL_TABLET | Freq: Every day | ORAL | 3 refills | Status: DC

## 2020-02-19 NOTE — Patient Instructions (Addendum)
blood tests are requested for you today.  We'll let you know about the results.   It is best to never miss the medication.  However, if you do miss it, next best is to double up the next time.   Please come back for a follow-up appointment in 1 year.    

## 2020-02-19 NOTE — Progress Notes (Signed)
Subjective:    Patient ID: Courtney Arnold, female    DOB: 08/14/1940, 79 y.o.   MRN: [id number]  HPI Pt returns for f/u of hyperthyroidism (head CT showed Graves Ophthalmopathy; dx'ed 2017; after a few mos of tapazole, she took RAI; she has been on Synthroid since then).  pt states she feels well in general.  She takes synthroid as rx'ed.  Past Medical History:  Diagnosis Date  . Atrial fibrillation ([place]) 2011   Postop, spontaneous conversion to normal sinus after one hour  . Compression fracture 07/24/09   T12; kyphoplasty  . History of echocardiogram 5/11   EF 65%  . Hypertension   . Thyroid disease   . Tobacco abuse     Past Surgical History:  Procedure Laterality Date  . BACK SURGERY    . BACK SURGERY  06/06/2015  . BREAST EXCISIONAL BIOPSY Left    50 years ago  benign  . CHOLECYSTECTOMY N/A 04/25/2015   Procedure: LAPAROSCOPIC CHOLECYSTECTOMY WITH INTRAOPERATIVE CHOLANGIOGRAM;  Surgeon: Mickeal Skinner, MD;  [facility];  Service: General;  Laterality: N/A;  . COLONOSCOPY N/A 11/26/2015   Procedure: COLONOSCOPY;  Surgeon: Daneil Dolin, MD;  [facility];  Service: Endoscopy;  Laterality: N/A;  7:30 am  . ERCP N/A 04/14/2015   Procedure: ENDOSCOPIC RETROGRADE CHOLANGIOPANCREATOGRAPHY (ERCP) Biliary Sphincterotomy, 10x7 stent placement Dilated bilary system just not well seen;  Surgeon: Rogene Houston, MD;  [facility];  Service: Endoscopy;  Laterality: N/A;  . ERCP N/A 06/12/2015   Procedure: ENDOSCOPIC RETROGRADE CHOLANGIOPANCREATOGRAPHY (ERCP);  Surgeon: Rogene Houston, MD;  [facility];  Service: Endoscopy;  Laterality: N/A;  . ESOPHAGOGASTRODUODENOSCOPY N/A 06/12/2015   Procedure: DIAGNOSTIC ESOPHAGOGASTRODUODENOSCOPY (EGD);  Surgeon: Rogene Houston, MD;  [facility];  Service: Endoscopy;  Laterality: N/A;  . FEMUR IM NAIL Right 05/11/2019   Procedure: RETROGRADE INTRAMEDULLARY NAIL FEMORAL;  Surgeon: Shona Needles, MD;   Location: [facility];  Service: Orthopedics;  Laterality: Right;  . STENT REMOVAL  06/12/2015   Procedure: STENT REMOVAL ;  Surgeon: Rogene Houston, MD;  [facility];  Service: Endoscopy;;    Social History   Socioeconomic History  . Marital status: Widowed    Spouse name: Not on file  . Number of children: 2  . Years of education: Not on file  . Highest education level: Not on file  Occupational History  . Occupation: retired  Tobacco Use  . Smoking status: Former Smoker    Packs/day: 0.15    Years: 20.00    Pack years: 3.00    Quit date: 03/08/2013    Years since quitting: 6.9  . Smokeless tobacco: Never Used  Vaping Use  . Vaping Use: Never used  Substance and Sexual Activity  . Alcohol use: No    Alcohol/week: 0.0 standard drinks  . Drug use: No  . Sexual activity: Not Currently  Other Topics Concern  . Not on file  Social History Narrative   Active in gardens and does yard work.   Social Determinants of Health   Financial Resource Strain: Low Risk   . Difficulty of Paying Living Expenses: Not hard at all  Food Insecurity: No Food Insecurity  . Worried About Charity fundraiser in the Last Year: Never true  . Ran Out of Food in the Last Year: Never true  Transportation Needs: No Transportation Needs  . Lack of Transportation (Medical): No  . Lack of  Transportation (Non-Medical): No  Physical Activity: Inactive  . Days of Exercise per Week: 0 days  . Minutes of Exercise per Session: 0 min  Stress: No Stress Concern Present  . Feeling of Stress : Not at all  Social Connections: Moderately Isolated  . Frequency of Communication with Friends and Family: More than three times a week  . Frequency of Social Gatherings with Friends and Family: More than three times a week  . Attends Religious Services: More than 4 times per year  . Active Member of Clubs or Organizations: No  . Attends Archivist Meetings: Never  . Marital Status: Widowed  Intimate  Partner Violence: Not At Risk  . Fear of Current or Ex-Partner: No  . Emotionally Abused: No  . Physically Abused: No  . Sexually Abused: No    Current Outpatient Medications on File Prior to Visit  Medication Sig Dispense Refill  . acyclovir (ZOVIRAX) 400 MG tablet TAKE (1) TABLET BY MOUTH TWICE DAILY. 60 tablet 6  . aspirin EC 81 MG tablet Take 81 mg by mouth daily.    . carboxymethylcellulose (REFRESH PLUS) 0.5 % SOLN Place 1 drop into both eyes in the morning, at noon, in the evening, and at bedtime.    . Cholecalciferol (VITAMIN D) 125 MCG (5000 UT) CAPS Take 5,000 Units by mouth daily. 90 capsule 1  . dexamethasone (DECADRON) 4 MG tablet Take 5 tablets (20 mg) on days 1, 8, and 15 of chemo. Repeat every 21 days. 30 tablet 3  . lenalidomide (REVLIMID) 15 MG capsule Take one capsule daily on days 1-14 every 21 days. 14 capsule 0  . NON FORMULARY Diet -Regular    . prochlorperazine (COMPAZINE) 10 MG tablet Take 1 tablet (10 mg total) by mouth every 6 (six) hours as needed (Nausea or vomiting). 30 tablet 1  . bortezomib IV (VELCADE) 3.5 MG injection Inject 1.3 mg/m2 into the vein once a week. Day 1, 8, 15 every 21 days (Patient not taking: Reported on 02/19/2020)    . cyclobenzaprine (FLEXERIL) 5 MG tablet Take 1 tablet (5 mg total) by mouth 3 (three) times daily as needed for muscle spasms. (Patient not taking: Reported on 02/19/2020) 30 tablet 0   No current facility-administered medications on file prior to visit.    No Known Allergies  Family History  Problem Relation Age of Onset  . Heart attack Father   . Stroke Father   . Breast cancer Sister   . Thyroid disease Neg Hx   . Colon cancer Neg Hx     BP 120/84   Pulse 86   Ht 5' 0.5" (1.537 m)   Wt 141 lb (64 kg)   SpO2 98%   BMI 27.08 kg/m    Review of Systems     Objective:   Physical Exam VITAL SIGNS:  See vs page GENERAL: no distress NECK: There is no palpable thyroid enlargement.  No thyroid nodule is  palpable.  No palpable lymphadenopathy at the anterior neck.  Lab Results  Component Value Date   TSH 4.63 (H) 02/19/2020   T3TOTAL 206 (H) 04/15/2015       Assessment & Plan:  Post-RAI hypothyroidism: uncontrolled.  I have sent a prescription to your pharmacy, to increase synthroid

## 2020-02-26 ENCOUNTER — Other Ambulatory Visit (HOSPITAL_COMMUNITY): Payer: Self-pay

## 2020-02-26 DIAGNOSIS — C9000 Multiple myeloma not having achieved remission: Secondary | ICD-10-CM

## 2020-02-26 MED ORDER — LENALIDOMIDE 15 MG PO CAPS: ORAL_CAPSULE | ORAL | 0 refills | Status: DC

## 2020-02-26 NOTE — Telephone Encounter (Signed)
Chart reviewed. Revlimid refilled per Dr. Delton Coombes.

## 2020-03-05 ENCOUNTER — Other Ambulatory Visit: Payer: Self-pay

## 2020-03-05 ENCOUNTER — Inpatient Hospital Stay (HOSPITAL_COMMUNITY): Payer: Medicare Other

## 2020-03-05 VITALS — BP 135/70 | HR 73 | Temp 96.3°F | Resp 18

## 2020-03-05 DIAGNOSIS — C9000 Multiple myeloma not having achieved remission: Secondary | ICD-10-CM

## 2020-03-05 LAB — COMPREHENSIVE METABOLIC PANEL
ALT: 13 U/L (ref 0–44)
AST: 14 U/L — ABNORMAL LOW (ref 15–41)
Albumin: 3.7 g/dL (ref 3.5–5.0)
Alkaline Phosphatase: 65 U/L (ref 38–126)
Anion gap: 10 (ref 5–15)
BUN: 14 mg/dL (ref 8–23)
CO2: 28 mmol/L (ref 22–32)
Calcium: 9.9 mg/dL (ref 8.9–10.3)
Chloride: 94 mmol/L — ABNORMAL LOW (ref 98–111)
Creatinine, Ser: 0.8 mg/dL (ref 0.44–1.00)
GFR, Estimated: >60 mL/min (ref >60)
Glucose, Bld: 141 mg/dL — ABNORMAL HIGH (ref 70–99)
Potassium: 3.5 mmol/L (ref 3.5–5.1)
Sodium: 132 mmol/L — ABNORMAL LOW (ref 135–145)
Total Bilirubin: 0.5 mg/dL (ref 0.3–1.2)
Total Protein: 7 g/dL (ref 6.5–8.1)

## 2020-03-05 MED ORDER — DENOSUMAB 120 MG/1.7ML ~~LOC~~ SOLN
120.0000 mg | Freq: Once | SUBCUTANEOUS | Status: AC
  Administered 2020-03-05: 13:00:00 120 mg via SUBCUTANEOUS
  Filled 2020-03-05: qty 1.7

## 2020-03-05 NOTE — Progress Notes (Signed)
Patient tolerated Xgeva injection with no complaints voiced.  Site clean and dry with no bruising or swelling noted.  No complaints of pain.  Discharged with vital signs stable and no signs or symptoms of distress noted.  

## 2020-03-06 MED ORDER — PEGFILGRASTIM-CBQV 6 MG/0.6ML ~~LOC~~ SOSY
PREFILLED_SYRINGE | SUBCUTANEOUS | Status: AC
  Filled 2020-03-06: qty 0.6

## 2020-03-17 ENCOUNTER — Other Ambulatory Visit (HOSPITAL_COMMUNITY): Payer: Self-pay

## 2020-03-17 DIAGNOSIS — C9000 Multiple myeloma not having achieved remission: Secondary | ICD-10-CM

## 2020-03-17 MED ORDER — LENALIDOMIDE 15 MG PO CAPS: ORAL_CAPSULE | ORAL | 0 refills | Status: DC

## 2020-03-17 NOTE — Telephone Encounter (Signed)
Chart reviewed. Revlimid refilled per Dr. Delton Coombes

## 2020-03-26 ENCOUNTER — Other Ambulatory Visit: Payer: Self-pay

## 2020-03-26 ENCOUNTER — Inpatient Hospital Stay (HOSPITAL_COMMUNITY): Payer: Medicare Other | Attending: Hematology

## 2020-03-26 DIAGNOSIS — Z87891 Personal history of nicotine dependence: Secondary | ICD-10-CM | POA: Insufficient documentation

## 2020-03-26 DIAGNOSIS — C9000 Multiple myeloma not having achieved remission: Secondary | ICD-10-CM | POA: Insufficient documentation

## 2020-03-26 DIAGNOSIS — M549 Dorsalgia, unspecified: Secondary | ICD-10-CM | POA: Insufficient documentation

## 2020-03-26 DIAGNOSIS — Z7982 Long term (current) use of aspirin: Secondary | ICD-10-CM | POA: Diagnosis not present

## 2020-03-26 DIAGNOSIS — I10 Essential (primary) hypertension: Secondary | ICD-10-CM | POA: Diagnosis not present

## 2020-03-26 DIAGNOSIS — I4891 Unspecified atrial fibrillation: Secondary | ICD-10-CM | POA: Insufficient documentation

## 2020-03-26 DIAGNOSIS — D649 Anemia, unspecified: Secondary | ICD-10-CM | POA: Diagnosis not present

## 2020-03-26 LAB — CBC WITH DIFFERENTIAL/PLATELET
Abs Immature Granulocytes: 0.02 10*3/uL (ref 0.00–0.07)
Basophils Absolute: 0 10*3/uL (ref 0.0–0.1)
Basophils Relative: 1 %
Eosinophils Absolute: 0.5 10*3/uL (ref 0.0–0.5)
Eosinophils Relative: 9 %
HCT: 32.1 % — ABNORMAL LOW (ref 36.0–46.0)
Hemoglobin: 9.6 g/dL — ABNORMAL LOW (ref 12.0–15.0)
Immature Granulocytes: 0 %
Lymphocytes Relative: 22 %
Lymphs Abs: 1.1 10*3/uL (ref 0.7–4.0)
MCH: 27.6 pg (ref 26.0–34.0)
MCHC: 29.9 g/dL — ABNORMAL LOW (ref 30.0–36.0)
MCV: 92.2 fL (ref 80.0–100.0)
Monocytes Absolute: 0.5 10*3/uL (ref 0.1–1.0)
Monocytes Relative: 9 %
Neutro Abs: 3 10*3/uL (ref 1.7–7.7)
Neutrophils Relative %: 59 %
Platelets: 267 10*3/uL (ref 150–400)
RBC: 3.48 MIL/uL — ABNORMAL LOW (ref 3.87–5.11)
RDW: 15.9 % — ABNORMAL HIGH (ref 11.5–15.5)
WBC: 5.1 10*3/uL (ref 4.0–10.5)
nRBC: 0 % (ref 0.0–0.2)

## 2020-03-26 LAB — COMPREHENSIVE METABOLIC PANEL
ALT: 14 U/L (ref 0–44)
AST: 13 U/L — ABNORMAL LOW (ref 15–41)
Albumin: 3.4 g/dL — ABNORMAL LOW (ref 3.5–5.0)
Alkaline Phosphatase: 66 U/L (ref 38–126)
Anion gap: 9 (ref 5–15)
BUN: 12 mg/dL (ref 8–23)
CO2: 30 mmol/L (ref 22–32)
Calcium: 9.4 mg/dL (ref 8.9–10.3)
Chloride: 96 mmol/L — ABNORMAL LOW (ref 98–111)
Creatinine, Ser: 0.71 mg/dL (ref 0.44–1.00)
GFR, Estimated: >60 mL/min (ref >60)
Glucose, Bld: 91 mg/dL (ref 70–99)
Potassium: 2.9 mmol/L — ABNORMAL LOW (ref 3.5–5.1)
Sodium: 135 mmol/L (ref 135–145)
Total Bilirubin: 0.3 mg/dL (ref 0.3–1.2)
Total Protein: 6.4 g/dL — ABNORMAL LOW (ref 6.5–8.1)

## 2020-03-26 LAB — LACTATE DEHYDROGENASE: LDH: 109 U/L (ref 98–192)

## 2020-03-27 LAB — PROTEIN ELECTROPHORESIS, SERUM
A/G Ratio: 1.1 (ref 0.7–1.7)
Albumin ELP: 3.2 g/dL (ref 2.9–4.4)
Alpha-1-Globulin: 0.3 g/dL (ref 0.0–0.4)
Alpha-2-Globulin: 0.8 g/dL (ref 0.4–1.0)
Beta Globulin: 1 g/dL (ref 0.7–1.3)
Gamma Globulin: 0.7 g/dL (ref 0.4–1.8)
Globulin, Total: 2.9 g/dL (ref 2.2–3.9)
Total Protein ELP: 6.1 g/dL (ref 6.0–8.5)

## 2020-03-27 LAB — KAPPA/LAMBDA LIGHT CHAINS
Kappa free light chain: 33.3 mg/L — ABNORMAL HIGH (ref 3.3–19.4)
Kappa, lambda light chain ratio: 1.44 (ref 0.26–1.65)
Lambda free light chains: 23.2 mg/L (ref 5.7–26.3)

## 2020-03-28 LAB — IMMUNOFIXATION ELECTROPHORESIS
IgA: 182 mg/dL (ref 64–422)
IgG (Immunoglobin G), Serum: 632 mg/dL (ref 586–1602)
IgM (Immunoglobulin M), Srm: 55 mg/dL (ref 26–217)
Total Protein ELP: 6.2 g/dL (ref 6.0–8.5)

## 2020-04-02 ENCOUNTER — Other Ambulatory Visit (HOSPITAL_COMMUNITY): Payer: Medicare Other

## 2020-04-02 ENCOUNTER — Inpatient Hospital Stay (HOSPITAL_COMMUNITY): Payer: Medicare Other

## 2020-04-02 ENCOUNTER — Inpatient Hospital Stay (HOSPITAL_BASED_OUTPATIENT_CLINIC_OR_DEPARTMENT_OTHER): Payer: Medicare Other | Admitting: Hematology

## 2020-04-02 ENCOUNTER — Other Ambulatory Visit: Payer: Self-pay

## 2020-04-02 VITALS — BP 147/64 | HR 79 | Temp 97.0°F | Resp 18 | Wt 141.0 lb

## 2020-04-02 DIAGNOSIS — C9001 Multiple myeloma in remission: Secondary | ICD-10-CM

## 2020-04-02 DIAGNOSIS — C9000 Multiple myeloma not having achieved remission: Secondary | ICD-10-CM | POA: Diagnosis not present

## 2020-04-02 DIAGNOSIS — Z7982 Long term (current) use of aspirin: Secondary | ICD-10-CM | POA: Diagnosis not present

## 2020-04-02 DIAGNOSIS — D649 Anemia, unspecified: Secondary | ICD-10-CM | POA: Diagnosis not present

## 2020-04-02 DIAGNOSIS — I4891 Unspecified atrial fibrillation: Secondary | ICD-10-CM | POA: Diagnosis not present

## 2020-04-02 DIAGNOSIS — I10 Essential (primary) hypertension: Secondary | ICD-10-CM | POA: Diagnosis not present

## 2020-04-02 DIAGNOSIS — M549 Dorsalgia, unspecified: Secondary | ICD-10-CM | POA: Diagnosis not present

## 2020-04-02 MED ORDER — DENOSUMAB 120 MG/1.7ML ~~LOC~~ SOLN
120.0000 mg | Freq: Once | SUBCUTANEOUS | Status: AC
Start: 2020-04-02 — End: 2020-04-02
  Administered 2020-04-02: 120 mg via SUBCUTANEOUS
  Filled 2020-04-02: qty 1.7

## 2020-04-02 NOTE — Patient Instructions (Signed)
[place] at [facility] Discharge Instructions  You were seen today by Dr. Delton Coombes. He went over your recent results. You received your Xgeva injection today; continue getting your injection every month. Continue taking Revlimid for 2 weeks on and 1 week off. Dr. Delton Coombes will see you back in 8 weeks for labs and follow up.   Thank you for choosing [place] at [facility] to provide your oncology and hematology care.  To afford each patient quality time with our provider, please arrive at least 15 minutes before your scheduled appointment time.   If you have a lab appointment with the [facility] please come in thru the Main Entrance and check in at the main information desk  You need to re-schedule your appointment should you arrive 10 or more minutes late.  We strive to give you quality time with our providers, and arriving late affects you and other patients whose appointments are after yours.  Also, if you no show three or more times for appointments you may be dismissed from the clinic at the providers discretion.     Again, thank you for choosing [facility].  Our hope is that these requests will decrease the amount of time that you wait before being seen by our physicians.       _____________________________________________________________  Should you have questions after your visit to [facility], please contact our office at [phone] between the hours of 8:00 a.m. and 4:30 p.m.  Voicemails left after 4:00 p.m. will not be returned until the following business day.  For prescription refill requests, have your pharmacy contact our office and allow 72 hours.    Cancer Center Support Programs:   > Cancer Support Group  2nd Tuesday of the month 1pm-2pm, Journey Room

## 2020-04-02 NOTE — Progress Notes (Signed)
Courtney Arnold presents today for office visit and injection per the provider's orders.  Xgeva administration without incident to right arm; injection site WNL; see MAR for injection details.  Patient tolerated procedure well and without incident.  No questions or complaints noted at this time.  Patient discharged ambulatory and in stable condition with family member.

## 2020-04-02 NOTE — Progress Notes (Signed)
[place], [place] [address]   CLINIC:  Medical Oncology/Hematology  PCP:  [name], [place] / [place] [address]  [phone]  REASON FOR VISIT:  Follow-up for IgA kappa plasma cell myeloma  PRIOR THERAPY: Velcade x 9 cycles from 07/04/2019 to 01/10/2020  CURRENT THERAPY: Revlimid 2 weeks on, 1 week off; Delton See monthly  INTERVAL HISTORY:  [name], a 80 y.o. female, returns for routine follow-up for her IgA kappa plasma cell myeloma. Courtney Arnold was last seen on 02/06/2020.  Today she is accompanied by her daughter and she reports feeling okay. She will start Revlimid on 1/27. She denies having any back pain or jaw pain.   REVIEW OF SYSTEMS:  Review of Systems  Constitutional: Positive for appetite change and fatigue.  Musculoskeletal: Negative for back pain.    PAST MEDICAL/SURGICAL HISTORY:  Past Medical History:  Diagnosis Date  . Atrial fibrillation ([facility]) 2011   Postop, spontaneous conversion to normal sinus after one hour  . Compression fracture 07/24/09   T12; kyphoplasty  . History of echocardiogram 5/11   EF 65%  . Hypertension   . Thyroid disease   . Tobacco abuse    Past Surgical History:  Procedure Laterality Date  . BACK SURGERY    . BACK SURGERY  06/06/2015  . BREAST EXCISIONAL BIOPSY Left    50 years ago  benign  . CHOLECYSTECTOMY N/A 04/25/2015   Procedure: LAPAROSCOPIC CHOLECYSTECTOMY WITH INTRAOPERATIVE CHOLANGIOGRAM;  Surgeon: Mickeal Skinner, MD;  Location: [facility];  Service: General;  Laterality: N/A;  . COLONOSCOPY N/A 11/26/2015   Procedure: COLONOSCOPY;  Surgeon: Daneil Dolin, MD;  [facility];  Service: Endoscopy;  Laterality: N/A;  7:30 am  . ERCP N/A 04/14/2015   Procedure: ENDOSCOPIC RETROGRADE CHOLANGIOPANCREATOGRAPHY (ERCP) Biliary Sphincterotomy, 10x7 stent placement Dilated bilary system just not well seen;  Surgeon: Rogene Houston, MD;  [facility]:  [facility];  Service: Endoscopy;  Laterality: N/A;  . ERCP N/A 06/12/2015   Procedure: ENDOSCOPIC RETROGRADE CHOLANGIOPANCREATOGRAPHY (ERCP);  Surgeon: Rogene Houston, MD;  [facility];  Service: Endoscopy;  Laterality: N/A;  . ESOPHAGOGASTRODUODENOSCOPY N/A 06/12/2015   Procedure: DIAGNOSTIC ESOPHAGOGASTRODUODENOSCOPY (EGD);  Surgeon: Rogene Houston, MD;  [facility];  Service: Endoscopy;  Laterality: N/A;  . FEMUR IM NAIL Right 05/11/2019   Procedure: RETROGRADE INTRAMEDULLARY NAIL FEMORAL;  Surgeon: Shona Needles, MD;  Location: [facility];  Service: Orthopedics;  Laterality: Right;  . STENT REMOVAL  06/12/2015   Procedure: STENT REMOVAL ;  Surgeon: Rogene Houston, MD;  [facility];  Service: Endoscopy;;    SOCIAL HISTORY:  Social History   Socioeconomic History  . Marital status: Widowed    Spouse name: Not on file  . Number of children: 2  . Years of education: Not on file  . Highest education level: Not on file  Occupational History  . Occupation: retired  Tobacco Use  . Smoking status: Former Smoker    Packs/day: 0.15    Years: 20.00    Pack years: 3.00    Quit date: 03/08/2013    Years since quitting: 7.0  . Smokeless tobacco: Never Used  Vaping Use  . Vaping Use: Never used  Substance and Sexual Activity  . Alcohol use: No    Alcohol/week: 0.0 standard drinks  . Drug use: No  . Sexual activity: Not Currently  Other Topics Concern  . Not  on file  Social History Narrative   Active in gardens and does yard work.   Social Determinants of Health   Financial Resource Strain: Low Risk   . Difficulty of Paying Living Expenses: Not hard at all  Food Insecurity: No Food Insecurity  . Worried About Charity fundraiser in the Last Year: Never true  . Ran Out of Food in the Last Year: Never true  Transportation Needs: No Transportation Needs  . Lack of Transportation (Medical): No  . Lack of Transportation (Non-Medical): No  Physical Activity:  Inactive  . Days of Exercise per Week: 0 days  . Minutes of Exercise per Session: 0 min  Stress: No Stress Concern Present  . Feeling of Stress : Not at all  Social Connections: Moderately Isolated  . Frequency of Communication with Friends and Family: More than three times a week  . Frequency of Social Gatherings with Friends and Family: More than three times a week  . Attends Religious Services: More than 4 times per year  . Active Member of Clubs or Organizations: No  . Attends Archivist Meetings: Never  . Marital Status: Widowed  Intimate Partner Violence: Not At Risk  . Fear of Current or Ex-Partner: No  . Emotionally Abused: No  . Physically Abused: No  . Sexually Abused: No    FAMILY HISTORY:  Family History  Problem Relation Age of Onset  . Heart attack Father   . Stroke Father   . Breast cancer Sister   . Thyroid disease Neg Hx   . Colon cancer Neg Hx     CURRENT MEDICATIONS:  Current Outpatient Medications  Medication Sig Dispense Refill  . acyclovir (ZOVIRAX) 400 MG tablet TAKE (1) TABLET BY MOUTH TWICE DAILY. 60 tablet 6  . aspirin EC 81 MG tablet Take 81 mg by mouth daily.    . bortezomib IV (VELCADE) 3.5 MG injection Inject 1.3 mg/m2 into the vein once a week. Day 1, 8, 15 every 21 days (Patient not taking: Reported on 02/19/2020)    . carboxymethylcellulose (REFRESH PLUS) 0.5 % SOLN Place 1 drop into both eyes in the morning, at noon, in the evening, and at bedtime.    . Cholecalciferol (VITAMIN D) 125 MCG (5000 UT) CAPS Take 5,000 Units by mouth daily. 90 capsule 1  . cyclobenzaprine (FLEXERIL) 5 MG tablet Take 1 tablet (5 mg total) by mouth 3 (three) times daily as needed for muscle spasms. (Patient not taking: Reported on 02/19/2020) 30 tablet 0  . dexamethasone (DECADRON) 4 MG tablet Take 5 tablets (20 mg) on days 1, 8, and 15 of chemo. Repeat every 21 days. 30 tablet 3  . lenalidomide (REVLIMID) 15 MG capsule Take one capsule daily on days 1-14  every 21 days. 14 capsule 0  . levothyroxine (SYNTHROID) 112 MCG tablet Take 1 tablet (112 mcg total) by mouth daily. 90 tablet 3  . NON FORMULARY Diet -Regular    . prochlorperazine (COMPAZINE) 10 MG tablet Take 1 tablet (10 mg total) by mouth every 6 (six) hours as needed (Nausea or vomiting). 30 tablet 1   No current facility-administered medications for this visit.    ALLERGIES:  No Known Allergies  PHYSICAL EXAM:  Performance status (ECOG): 1 - Symptomatic but completely ambulatory  Vitals:   04/02/20 1359  BP: (!) 147/64  Pulse: 79  Resp: 18  Temp: (!) 97 F (36.1 C)  SpO2: 98%   Wt Readings from Last 3 Encounters:  04/02/20 141  lb (64 kg)  02/19/20 141 lb (64 kg)  02/06/20 139 lb 8 oz (63.3 kg)   Physical Exam Vitals reviewed.  Constitutional:      Appearance: Normal appearance.  Cardiovascular:     Rate and Rhythm: Normal rate and regular rhythm.     Pulses: Normal pulses.     Heart sounds: Normal heart sounds.  Pulmonary:     Effort: Pulmonary effort is normal.     Breath sounds: Normal breath sounds.  Abdominal:     Palpations: Abdomen is soft. There is no hepatomegaly or mass.     Tenderness: There is no abdominal tenderness.     Hernia: No hernia is present.  Musculoskeletal:     Right lower leg: No edema.     Left lower leg: No edema.  Neurological:     General: No focal deficit present.     Mental Status: She is alert and oriented to person, place, and time.  Psychiatric:        Mood and Affect: Mood normal.        Behavior: Behavior normal.     LABORATORY DATA:  I have reviewed the labs as listed.  CBC Latest Ref Rng & Units 03/26/2020 02/06/2020 01/10/2020  WBC 4.0 - 10.5 K/uL 5.1 4.3 3.3(L)  Hemoglobin 12.0 - 15.0 g/dL 9.6(L) 9.1(L) 9.2(L)  Hematocrit 36.0 - 46.0 % 32.1(L) 29.2(L) 29.1(L)  Platelets 150 - 400 K/uL 267 282 204   CMP Latest Ref Rng & Units 03/26/2020 03/05/2020 02/06/2020  Glucose 70 - 99 mg/dL 91 141(H) 161(H)  BUN 8 - 23  mg/dL 12 14 16   Creatinine 0.44 - 1.00 mg/dL 0.71 0.80 0.67  Sodium 135 - 145 mmol/L 135 132(L) 134(L)  Potassium 3.5 - 5.1 mmol/L 2.9(L) 3.5 3.8  Chloride 98 - 111 mmol/L 96(L) 94(L) 100  CO2 22 - 32 mmol/L 30 28 26   Calcium 8.9 - 10.3 mg/dL 9.4 9.9 10.4(H)  Total Protein 6.5 - 8.1 g/dL 6.4(L) 7.0 6.7  Total Bilirubin 0.3 - 1.2 mg/dL 0.3 0.5 0.6  Alkaline Phos 38 - 126 U/L 66 65 59  AST 15 - 41 U/L 13(L) 14(L) 18  ALT 0 - 44 U/L 14 13 13       Component Value Date/Time   RBC 3.48 (L) 03/26/2020 1427   MCV 92.2 03/26/2020 1427   MCH 27.6 03/26/2020 1427   MCHC 29.9 (L) 03/26/2020 1427   RDW 15.9 (H) 03/26/2020 1427   LYMPHSABS 1.1 03/26/2020 1427   MONOABS 0.5 03/26/2020 1427   EOSABS 0.5 03/26/2020 1427   BASOSABS 0.0 03/26/2020 1427   Lab Results  Component Value Date   LDH 109 03/26/2020   LDH 138 02/06/2020   LDH 137 01/10/2020   Lab Results  Component Value Date   TOTALPROTELP 6.1 03/26/2020   TOTALPROTELP 6.2 03/26/2020   ALBUMINELP 3.2 03/26/2020   A1GS 0.3 03/26/2020   A2GS 0.8 03/26/2020   BETS 1.0 03/26/2020   GAMS 0.7 03/26/2020   MSPIKE Not Observed 03/26/2020   SPEI Comment 03/26/2020    Lab Results  Component Value Date   KPAFRELGTCHN 33.3 (H) 03/26/2020   LAMBDASER 23.2 03/26/2020   KAPLAMBRATIO 1.44 03/26/2020    DIAGNOSTIC IMAGING:  I have independently reviewed the scans and discussed with the patient. No results found.   ASSESSMENT:  1. IgA kappa plasma cell myeloma: -IgA kappa plasma cell myeloma diagnosed on right sacral bone biopsy on 05/17/2019. SPEP did not show M spike. Immunofixation shows IgA  kappa. Kappa light chains 42.8, lambda light chains at 9.9, ratio 4.32. Beta-2 microglobulin 3.7. -PET scan on 06/04/2019 showed expansile lytic lesion involving the right sacral wing. Lytic lesion involving T10 vertebral body. Left seventh rib lesion. Increased uptake in a pathological fracture involving distal aspect of the right  femur. 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 -RVD started on 06/26/2019.Revlimid 15 mg 2 weeks on/1 week off. -Velcade discontinued on 01/10/2020. -She is taking Revlimid 15 mg 2 weeks on 1 week off, with dexamethasone 20 mg on weeks of Revlimid and 10 mg while she is off Revlimid.   PLAN:  1. IgA kappa plasma cell myeloma: -She is tolerating Revlimid 15 mg 2 weeks on/1 week off very well.  She takes dexamethasone 20 mg weekly on the weeks on Revlimid and 10 mg on the week off Revlimid. -Reviewed labs from 03/26/2020.  M spike is negative.  Free light chain ratio is normal.  Immunofixation is normal. -Continue the current regimen.  RTC 2 months for follow-up with repeat labs.  2. Back pain: -She does not report any back pain at this time.  3. Normocytic anemia: -Hemoglobin is 9.6.  Will check ferritin and iron panel prior to next visit.  4. ID prophylaxis: -Continue acyclovir twice daily.  Continue aspirin 81 mg for thromboprophylaxis.  5. Myeloma bone disease: -Continue denosumab monthly.  Continue calcium twice daily.  Orders placed this encounter:  No orders of the defined types were placed in this encounter.    Derek Jack, MD [place] [phone]   I, Milinda Antis, am acting as a scribe for Dr. Sanda Linger.  I, Derek Jack MD, have reviewed the above documentation for accuracy and completeness, and I agree with the above.

## 2020-04-02 NOTE — Progress Notes (Signed)
Patient was assessed by Dr. Katragadda and labs have been reviewed.  Patient is okay to proceed with Xgeva injection today. Primary nurse and pharmacy aware.   

## 2020-04-08 ENCOUNTER — Other Ambulatory Visit (HOSPITAL_COMMUNITY): Payer: Self-pay

## 2020-04-08 DIAGNOSIS — C9000 Multiple myeloma not having achieved remission: Secondary | ICD-10-CM

## 2020-04-08 MED ORDER — LENALIDOMIDE 15 MG PO CAPS: ORAL_CAPSULE | ORAL | 0 refills | Status: DC

## 2020-04-08 NOTE — Telephone Encounter (Signed)
Chart reviewed. Revlimid refilled per Dr. Delton Coombes

## 2020-04-10 DIAGNOSIS — H2513 Age-related nuclear cataract, bilateral: Secondary | ICD-10-CM | POA: Diagnosis not present

## 2020-04-10 DIAGNOSIS — H524 Presbyopia: Secondary | ICD-10-CM | POA: Diagnosis not present

## 2020-04-10 DIAGNOSIS — H04123 Dry eye syndrome of bilateral lacrimal glands: Secondary | ICD-10-CM | POA: Diagnosis not present

## 2020-04-30 ENCOUNTER — Inpatient Hospital Stay (HOSPITAL_COMMUNITY): Payer: Medicare Other

## 2020-04-30 ENCOUNTER — Other Ambulatory Visit: Payer: Self-pay

## 2020-04-30 ENCOUNTER — Inpatient Hospital Stay (HOSPITAL_COMMUNITY): Payer: Medicare Other | Attending: Hematology

## 2020-04-30 ENCOUNTER — Encounter (HOSPITAL_COMMUNITY): Payer: Self-pay

## 2020-04-30 VITALS — BP 125/64 | HR 70 | Temp 97.1°F | Resp 18

## 2020-04-30 DIAGNOSIS — C9000 Multiple myeloma not having achieved remission: Secondary | ICD-10-CM

## 2020-04-30 LAB — COMPREHENSIVE METABOLIC PANEL
ALT: 12 U/L (ref 0–44)
AST: 16 U/L (ref 15–41)
Albumin: 3.6 g/dL (ref 3.5–5.0)
Alkaline Phosphatase: 55 U/L (ref 38–126)
Anion gap: 7 (ref 5–15)
BUN: 13 mg/dL (ref 8–23)
CO2: 28 mmol/L (ref 22–32)
Calcium: 8.2 mg/dL — ABNORMAL LOW (ref 8.9–10.3)
Chloride: 100 mmol/L (ref 98–111)
Creatinine, Ser: 0.68 mg/dL (ref 0.44–1.00)
GFR, Estimated: >60 mL/min (ref >60)
Glucose, Bld: 98 mg/dL (ref 70–99)
Potassium: 3.4 mmol/L — ABNORMAL LOW (ref 3.5–5.1)
Sodium: 135 mmol/L (ref 135–145)
Total Bilirubin: 0.4 mg/dL (ref 0.3–1.2)
Total Protein: 6.7 g/dL (ref 6.5–8.1)

## 2020-04-30 MED ORDER — DENOSUMAB 120 MG/1.7ML ~~LOC~~ SOLN
120.0000 mg | Freq: Once | SUBCUTANEOUS | Status: AC
  Administered 2020-04-30: 120 mg via SUBCUTANEOUS
  Filled 2020-04-30: qty 1.7

## 2020-04-30 NOTE — Progress Notes (Signed)
Taking calcium with vitamin D as directed.  Denied tooth, jaw, and leg pain.  No s/s of distress noted.   Patient taking calcium as directed.  Denied tooth, jaw, and leg pain.  No recent or upcoming dental visits.  Labs reviewed.  Patient tolerated injection with no complaints voiced.  See MAR for details.  Site clean and dry with no bruising or swelling noted.  Band aid applied.  Vss with discharge and left ambulatory with no s/s of distress noted.

## 2020-05-06 ENCOUNTER — Other Ambulatory Visit (HOSPITAL_COMMUNITY): Payer: Self-pay

## 2020-05-06 DIAGNOSIS — C9000 Multiple myeloma not having achieved remission: Secondary | ICD-10-CM

## 2020-05-06 MED ORDER — LENALIDOMIDE 15 MG PO CAPS: ORAL_CAPSULE | ORAL | 0 refills | Status: DC

## 2020-05-06 NOTE — Telephone Encounter (Signed)
Chart reviewed. Revlimid refilled per Dr. Delton Coombes

## 2020-05-19 ENCOUNTER — Other Ambulatory Visit (HOSPITAL_COMMUNITY): Payer: Self-pay | Admitting: Hematology

## 2020-05-19 DIAGNOSIS — C9000 Multiple myeloma not having achieved remission: Secondary | ICD-10-CM

## 2020-05-22 ENCOUNTER — Other Ambulatory Visit (HOSPITAL_COMMUNITY): Payer: Self-pay

## 2020-05-22 DIAGNOSIS — C9000 Multiple myeloma not having achieved remission: Secondary | ICD-10-CM

## 2020-05-22 MED ORDER — LENALIDOMIDE 15 MG PO CAPS: ORAL_CAPSULE | ORAL | 0 refills | Status: DC

## 2020-05-22 NOTE — Telephone Encounter (Signed)
Chart reviewed. Revlimid refilled per Dr. Delton Coombes

## 2020-05-26 ENCOUNTER — Inpatient Hospital Stay (HOSPITAL_COMMUNITY): Payer: Medicare Other | Attending: Hematology

## 2020-05-26 ENCOUNTER — Other Ambulatory Visit: Payer: Self-pay

## 2020-05-26 DIAGNOSIS — D649 Anemia, unspecified: Secondary | ICD-10-CM | POA: Insufficient documentation

## 2020-05-26 DIAGNOSIS — C9001 Multiple myeloma in remission: Secondary | ICD-10-CM

## 2020-05-26 DIAGNOSIS — C9000 Multiple myeloma not having achieved remission: Secondary | ICD-10-CM | POA: Insufficient documentation

## 2020-05-26 LAB — COMPREHENSIVE METABOLIC PANEL
ALT: 16 U/L (ref 0–44)
AST: 15 U/L (ref 15–41)
Albumin: 3.6 g/dL (ref 3.5–5.0)
Alkaline Phosphatase: 61 U/L (ref 38–126)
Anion gap: 9 (ref 5–15)
BUN: 17 mg/dL (ref 8–23)
CO2: 27 mmol/L (ref 22–32)
Calcium: 8.7 mg/dL — ABNORMAL LOW (ref 8.9–10.3)
Chloride: 100 mmol/L (ref 98–111)
Creatinine, Ser: 0.77 mg/dL (ref 0.44–1.00)
GFR, Estimated: >60 mL/min (ref >60)
Glucose, Bld: 113 mg/dL — ABNORMAL HIGH (ref 70–99)
Potassium: 3.3 mmol/L — ABNORMAL LOW (ref 3.5–5.1)
Sodium: 136 mmol/L (ref 135–145)
Total Bilirubin: 0.4 mg/dL (ref 0.3–1.2)
Total Protein: 6.4 g/dL — ABNORMAL LOW (ref 6.5–8.1)

## 2020-05-26 LAB — CBC WITH DIFFERENTIAL/PLATELET
Abs Immature Granulocytes: 0.03 10*3/uL (ref 0.00–0.07)
Basophils Absolute: 0 10*3/uL (ref 0.0–0.1)
Basophils Relative: 1 %
Eosinophils Absolute: 0.2 10*3/uL (ref 0.0–0.5)
Eosinophils Relative: 3 %
HCT: 34.7 % — ABNORMAL LOW (ref 36.0–46.0)
Hemoglobin: 10.6 g/dL — ABNORMAL LOW (ref 12.0–15.0)
Immature Granulocytes: 1 %
Lymphocytes Relative: 23 %
Lymphs Abs: 1.2 10*3/uL (ref 0.7–4.0)
MCH: 28.1 pg (ref 26.0–34.0)
MCHC: 30.5 g/dL (ref 30.0–36.0)
MCV: 92 fL (ref 80.0–100.0)
Monocytes Absolute: 0.5 10*3/uL (ref 0.1–1.0)
Monocytes Relative: 10 %
Neutro Abs: 3.3 10*3/uL (ref 1.7–7.7)
Neutrophils Relative %: 62 %
Platelets: 235 10*3/uL (ref 150–400)
RBC: 3.77 MIL/uL — ABNORMAL LOW (ref 3.87–5.11)
RDW: 18.1 % — ABNORMAL HIGH (ref 11.5–15.5)
WBC: 5.1 10*3/uL (ref 4.0–10.5)
nRBC: 0 % (ref 0.0–0.2)

## 2020-05-26 LAB — LACTATE DEHYDROGENASE: LDH: 116 U/L (ref 98–192)

## 2020-05-27 LAB — KAPPA/LAMBDA LIGHT CHAINS
Kappa free light chain: 21.3 mg/L — ABNORMAL HIGH (ref 3.3–19.4)
Kappa, lambda light chain ratio: 1.55 (ref 0.26–1.65)
Lambda free light chains: 13.7 mg/L (ref 5.7–26.3)

## 2020-05-28 ENCOUNTER — Inpatient Hospital Stay (HOSPITAL_BASED_OUTPATIENT_CLINIC_OR_DEPARTMENT_OTHER): Payer: Medicare Other | Admitting: Hematology

## 2020-05-28 ENCOUNTER — Other Ambulatory Visit: Payer: Self-pay

## 2020-05-28 ENCOUNTER — Inpatient Hospital Stay (HOSPITAL_COMMUNITY): Payer: Medicare Other

## 2020-05-28 VITALS — BP 101/45 | HR 80 | Temp 96.7°F | Resp 18 | Wt 134.2 lb

## 2020-05-28 DIAGNOSIS — C9000 Multiple myeloma not having achieved remission: Secondary | ICD-10-CM

## 2020-05-28 DIAGNOSIS — C9001 Multiple myeloma in remission: Secondary | ICD-10-CM

## 2020-05-28 DIAGNOSIS — D649 Anemia, unspecified: Secondary | ICD-10-CM | POA: Diagnosis not present

## 2020-05-28 LAB — PROTEIN ELECTROPHORESIS, SERUM
A/G Ratio: 1.5 (ref 0.7–1.7)
Albumin ELP: 3.5 g/dL (ref 2.9–4.4)
Alpha-1-Globulin: 0.3 g/dL (ref 0.0–0.4)
Alpha-2-Globulin: 0.8 g/dL (ref 0.4–1.0)
Beta Globulin: 1 g/dL (ref 0.7–1.3)
Gamma Globulin: 0.4 g/dL (ref 0.4–1.8)
Globulin, Total: 2.4 g/dL (ref 2.2–3.9)
Total Protein ELP: 5.9 g/dL — ABNORMAL LOW (ref 6.0–8.5)

## 2020-05-28 MED ORDER — DENOSUMAB 120 MG/1.7ML ~~LOC~~ SOLN
120.0000 mg | Freq: Once | SUBCUTANEOUS | Status: AC
  Administered 2020-05-28: 120 mg via SUBCUTANEOUS
  Filled 2020-05-28: qty 1.7

## 2020-05-28 NOTE — Progress Notes (Signed)
[place], [place] [address]   CLINIC:  Medical Oncology/Hematology  PCP:  [name], [place] / [place] [address]  [phone]  REASON FOR VISIT:  Follow-up for IgA kappa plasma cell myeloma  PRIOR THERAPY: Velcade x 9 cycles from 07/04/2019 to 01/10/2020  CURRENT THERAPY: Revlimid 15 mg 2/3 weeks; Delton See monthly  INTERVAL HISTORY:  Courtney Arnold, a 80 y.o. female, returns for routine follow-up for her IgA kappa plasma cell myeloma. Courtney Arnold was last seen on 04/02/2020.  Today she is accompanied by her daughter and she reports feeling well. Her energy levels are still good. She is taking Revlimid 2 weeks on and 1 week off along with Decadron 20 mg on weeks that she is taking Revlimid and 10 mg on her off week. She denies having any new pains, N/V/D/C, numbness or tingling. She is working in her yard and taking care of her flowers.   REVIEW OF SYSTEMS:  Review of Systems  Constitutional: Positive for fatigue (75%). Negative for appetite change.  Gastrointestinal: Negative for constipation, diarrhea, nausea and vomiting.  Musculoskeletal: Negative for arthralgias and myalgias.  Neurological: Negative for numbness.  All other systems reviewed and are negative.   PAST MEDICAL/SURGICAL HISTORY:  Past Medical History:  Diagnosis Date  . Atrial fibrillation ([facility]) 2011   Postop, spontaneous conversion to normal sinus after one hour  . Compression fracture 07/24/09   T12; kyphoplasty  . History of echocardiogram 5/11   EF 65%  . Hypertension   . Thyroid disease   . Tobacco abuse    Past Surgical History:  Procedure Laterality Date  . BACK SURGERY    . BACK SURGERY  06/06/2015  . BREAST EXCISIONAL BIOPSY Left    50 years ago  benign  . CHOLECYSTECTOMY N/A 04/25/2015   Procedure: LAPAROSCOPIC CHOLECYSTECTOMY WITH INTRAOPERATIVE CHOLANGIOGRAM;  Surgeon: Mickeal Skinner, MD;  [facility];  Service:  General;  Laterality: N/A;  . COLONOSCOPY N/A 11/26/2015   Procedure: COLONOSCOPY;  Surgeon: Daneil Dolin, MD;  [facility];  Service: Endoscopy;  Laterality: N/A;  7:30 am  . ERCP N/A 04/14/2015   Procedure: ENDOSCOPIC RETROGRADE CHOLANGIOPANCREATOGRAPHY (ERCP) Biliary Sphincterotomy, 10x7 stent placement Dilated bilary system just not well seen;  Surgeon: Rogene Houston, MD;  [facility];  Service: Endoscopy;  Laterality: N/A;  . ERCP N/A 06/12/2015   Procedure: ENDOSCOPIC RETROGRADE CHOLANGIOPANCREATOGRAPHY (ERCP);  Surgeon: Rogene Houston, MD;  [facility];  Service: Endoscopy;  Laterality: N/A;  . ESOPHAGOGASTRODUODENOSCOPY N/A 06/12/2015   Procedure: DIAGNOSTIC ESOPHAGOGASTRODUODENOSCOPY (EGD);  Surgeon: Rogene Houston, MD;  [facility];  Service: Endoscopy;  Laterality: N/A;  . FEMUR IM NAIL Right 05/11/2019   Procedure: RETROGRADE INTRAMEDULLARY NAIL FEMORAL;  Surgeon: Shona Needles, MD;  Location: [facility];  Service: Orthopedics;  Laterality: Right;  . STENT REMOVAL  06/12/2015   Procedure: STENT REMOVAL ;  Surgeon: Rogene Houston, MD;  [facility];  Service: Endoscopy;;    SOCIAL HISTORY:  Social History   Socioeconomic History  . Marital status: Widowed    Spouse name: Not on file  . Number of children: 2  . Years of education: Not on file  . Highest education level: Not on file  Occupational History  . Occupation: retired  Tobacco Use  . Smoking status: Former Smoker    Packs/day: 0.15    Years: 20.00    Pack  years: 3.00    Quit date: 03/08/2013    Years since quitting: 7.2  . Smokeless tobacco: Never Used  Vaping Use  . Vaping Use: Never used  Substance and Sexual Activity  . Alcohol use: No    Alcohol/week: 0.0 standard drinks  . Drug use: No  . Sexual activity: Not Currently  Other Topics Concern  . Not on file  Social History Narrative   Active in gardens and does yard work.   Social Determinants of Health    Financial Resource Strain: Not on file  Food Insecurity: No Food Insecurity  . Worried About Charity fundraiser in the Last Year: Never true  . Ran Out of Food in the Last Year: Never true  Transportation Needs: No Transportation Needs  . Lack of Transportation (Medical): No  . Lack of Transportation (Non-Medical): No  Physical Activity: Not on file  Stress: Not on file  Social Connections: Moderately Isolated  . Frequency of Communication with Friends and Family: More than three times a week  . Frequency of Social Gatherings with Friends and Family: More than three times a week  . Attends Religious Services: More than 4 times per year  . Active Member of Clubs or Organizations: No  . Attends Archivist Meetings: Never  . Marital Status: Widowed  Intimate Partner Violence: Not on file    FAMILY HISTORY:  Family History  Problem Relation Age of Onset  . Heart attack Father   . Stroke Father   . Breast cancer Sister   . Thyroid disease Neg Hx   . Colon cancer Neg Hx     CURRENT MEDICATIONS:  Current Outpatient Medications  Medication Sig Dispense Refill  . acyclovir (ZOVIRAX) 400 MG tablet TAKE (1) TABLET BY MOUTH TWICE DAILY. 60 tablet 0  . aspirin EC 81 MG tablet Take 81 mg by mouth daily.    . bortezomib IV (VELCADE) 3.5 MG injection Inject 1.3 mg/m2 into the vein once a week. Day 1, 8, 15 every 21 days    . carboxymethylcellulose (REFRESH PLUS) 0.5 % SOLN Place 1 drop into both eyes in the morning, at noon, in the evening, and at bedtime.    . Cholecalciferol (VITAMIN D) 125 MCG (5000 UT) CAPS Take 5,000 Units by mouth daily. 90 capsule 1  . cyclobenzaprine (FLEXERIL) 5 MG tablet Take 1 tablet (5 mg total) by mouth 3 (three) times daily as needed for muscle spasms. 30 tablet 0  . dexamethasone (DECADRON) 4 MG tablet Take 5 tablets (20 mg) on days 1, 8, and 15 of chemo. Repeat every 21 days. 30 tablet 3  . lenalidomide (REVLIMID) 15 MG capsule Take one capsule  daily on days 1-14 every 21 days. 14 capsule 0  . levothyroxine (SYNTHROID) 112 MCG tablet Take 1 tablet (112 mcg total) by mouth daily. 90 tablet 3  . NON FORMULARY Diet -Regular    . prochlorperazine (COMPAZINE) 10 MG tablet Take 1 tablet (10 mg total) by mouth every 6 (six) hours as needed (Nausea or vomiting). 30 tablet 1   No current facility-administered medications for this visit.    ALLERGIES:  No Known Allergies  PHYSICAL EXAM:  Performance status (ECOG): 1 - Symptomatic but completely ambulatory  Vitals:   05/28/20 1408  BP: (!) 101/45  Pulse: 80  Resp: 18  Temp: (!) 96.7 F (35.9 C)  SpO2: 98%   Wt Readings from Last 3 Encounters:  05/28/20 134 lb 3.2 oz (60.9 kg)  04/02/20  141 lb (64 kg)  02/19/20 141 lb (64 kg)   Physical Exam Vitals reviewed.  Constitutional:      Appearance: Normal appearance.  Cardiovascular:     Rate and Rhythm: Normal rate and regular rhythm.     Pulses: Normal pulses.     Heart sounds: Normal heart sounds.  Pulmonary:     Effort: Pulmonary effort is normal.     Breath sounds: Normal breath sounds.  Musculoskeletal:     Thoracic back: No tenderness or bony tenderness.     Lumbar back: No tenderness or bony tenderness.     Right lower leg: No edema.     Left lower leg: No edema.  Neurological:     General: No focal deficit present.     Mental Status: She is alert and oriented to person, place, and time.  Psychiatric:        Mood and Affect: Mood normal.        Behavior: Behavior normal.     LABORATORY DATA:  I have reviewed the labs as listed.  CBC Latest Ref Rng & Units 05/26/2020 03/26/2020 02/06/2020  WBC 4.0 - 10.5 K/uL 5.1 5.1 4.3  Hemoglobin 12.0 - 15.0 g/dL 10.6(L) 9.6(L) 9.1(L)  Hematocrit 36.0 - 46.0 % 34.7(L) 32.1(L) 29.2(L)  Platelets 150 - 400 K/uL 235 267 282   CMP Latest Ref Rng & Units 05/26/2020 04/30/2020 03/26/2020  Glucose 70 - 99 mg/dL 113(H) 98 91  BUN 8 - 23 mg/dL 17 13 12   Creatinine 0.44 - 1.00 mg/dL  0.77 0.68 0.71  Sodium 135 - 145 mmol/L 136 135 135  Potassium 3.5 - 5.1 mmol/L 3.3(L) 3.4(L) 2.9(L)  Chloride 98 - 111 mmol/L 100 100 96(L)  CO2 22 - 32 mmol/L 27 28 30   Calcium 8.9 - 10.3 mg/dL 8.7(L) 8.2(L) 9.4  Total Protein 6.5 - 8.1 g/dL 6.4(L) 6.7 6.4(L)  Total Bilirubin 0.3 - 1.2 mg/dL 0.4 0.4 0.3  Alkaline Phos 38 - 126 U/L 61 55 66  AST 15 - 41 U/L 15 16 13(L)  ALT 0 - 44 U/L 16 12 14       Component Value Date/Time   RBC 3.77 (L) 05/26/2020 1311   MCV 92.0 05/26/2020 1311   MCH 28.1 05/26/2020 1311   MCHC 30.5 05/26/2020 1311   RDW 18.1 (H) 05/26/2020 1311   LYMPHSABS 1.2 05/26/2020 1311   MONOABS 0.5 05/26/2020 1311   EOSABS 0.2 05/26/2020 1311   BASOSABS 0.0 05/26/2020 1311   Lab Results  Component Value Date   LDH 116 05/26/2020   LDH 109 03/26/2020   LDH 138 02/06/2020   Lab Results  Component Value Date   TOTALPROTELP 6.1 03/26/2020   TOTALPROTELP 6.2 03/26/2020   ALBUMINELP 3.2 03/26/2020   A1GS 0.3 03/26/2020   A2GS 0.8 03/26/2020   BETS 1.0 03/26/2020   GAMS 0.7 03/26/2020   MSPIKE Not Observed 03/26/2020   SPEI Comment 03/26/2020    Lab Results  Component Value Date   KPAFRELGTCHN 21.3 (H) 05/26/2020   LAMBDASER 13.7 05/26/2020   KAPLAMBRATIO 1.55 05/26/2020    DIAGNOSTIC IMAGING:  I have independently reviewed the scans and discussed with the patient. No results found.   ASSESSMENT:  1. IgA kappa plasma cell myeloma: -IgA kappa plasma cell myeloma diagnosed on right sacral bone biopsy on 05/17/2019. SPEP did not show M spike. Immunofixation shows IgA kappa. Kappa light chains 42.8, lambda light chains at 9.9, ratio 4.32. Beta-2 microglobulin 3.7. -PET scan on 06/04/2019 showed expansile  lytic lesion involving the right sacral wing. Lytic lesion involving T10 vertebral body. Left seventh rib lesion. Increased uptake in a pathological fracture involving distal aspect of the right femur. -FISH panel shows loss of 1p, gain of 1 q.,  hyperdiploidy/monosomy of 13 q. and 14 representing high risk. -RVD started on 06/26/2019.Revlimid 15 mg 2 weeks on/1 week off. -Velcade discontinued on 01/10/2020. -She is taking Revlimid 15 mg 2 weeks on 1 week off, with dexamethasone 20 mg on weeks of Revlimid and 10 mg while she is off Revlimid.   PLAN:  1. IgA kappa plasma cell myeloma: -She is taking Revlimid 15 mg 2 weeks on/1 week off. -She is taking dexamethasone 20 mg during the weeks of Revlimid and 10 mg during the off week. -Reviewed myeloma labs from today which showed kappa light chains 21.3 and ratio of 1.55.  SPEP and immunofixation are pending.  Labs from 03/26/2020 showed negative SPEP and immunofixation. -Recommend to cut back on dexamethasone to 10 mg weekly and maintain the dose of Revlimid 15 mg 2 weeks on/1 week off. -RTC 2 months for follow-up with repeat labs.  2. Back pain: -She does not report any back pain at this time.  3. Normocytic anemia: -Hemoglobin has improved to 10.6.  We will closely monitor.  4. ID prophylaxis: -Continue acyclovir twice daily.  Continue aspirin for thromboprophylaxis.  5. Myeloma bone disease: -Continue denosumab monthly.  Continue calcium supplements daily.  Orders placed this encounter:  Orders Placed This Encounter  Procedures  . CBC with Differential/Platelet  . Comprehensive metabolic panel  . Immunofixation electrophoresis  . Kappa/lambda light chains  . Protein electrophoresis, serum     Derek Jack, MD [place] [phone]   I, Milinda Antis, am acting as a scribe for Dr. Sanda Linger.  I, Derek Jack MD, have reviewed the above documentation for accuracy and completeness, and I agree with the above.

## 2020-05-28 NOTE — Progress Notes (Signed)
.  Courtney Arnold presents today for injection per the provider's orders.  Xgeva administration without incident; injection site WNL; see MAR for injection details. No recent dental or future appointments. Pt is taking Vit.D as directed and no tooth or jaw pain  Patient tolerated procedure well and without incident.  No questions or complaints noted at this time. Pt discharged ambulatory in stable condition accompanied by daughter.

## 2020-05-28 NOTE — Progress Notes (Signed)
Patient was assessed by Dr. Katragadda and labs have been reviewed.  Patient is okay to proceed with Xgeva injection today. Primary nurse and pharmacy aware.   

## 2020-05-28 NOTE — Patient Instructions (Addendum)
[place] at [facility] Discharge Instructions  You were seen today by Dr. Delton Coombes. He went over your recent results. You received your Xgeva injection today; continue getting your injection every month. Continue taking Revlimid 2 weeks on with 1 week off and start taking 2.5 tablets of Decadron (10 mg) once a week every week. Eat at least 1 fruit per day to improve your potassium levels. Dr. Delton Coombes will see you back in 2 months for labs and follow up.   Thank you for choosing [place] at [facility] to provide your oncology and hematology care.  To afford each patient quality time with our provider, please arrive at least 15 minutes before your scheduled appointment time.   If you have a lab appointment with the [facility] please come in thru the Main Entrance and check in at the main information desk  You need to re-schedule your appointment should you arrive 10 or more minutes late.  We strive to give you quality time with our providers, and arriving late affects you and other patients whose appointments are after yours.  Also, if you no show three or more times for appointments you may be dismissed from the clinic at the providers discretion.     Again, thank you for choosing [facility].  Our hope is that these requests will decrease the amount of time that you wait before being seen by our physicians.       _____________________________________________________________  Should you have questions after your visit to [facility], please contact our office at [phone] between the hours of 8:00 a.m. and 4:30 p.m.  Voicemails left after 4:00 p.m. will not be returned until the following business day.  For prescription refill requests, have your pharmacy contact our office and allow 72 hours.    Cancer Center Support Programs:   > Cancer Support Group  2nd Tuesday of the month 1pm-2pm, Journey Room

## 2020-05-29 LAB — IMMUNOFIXATION ELECTROPHORESIS
IgA: 150 mg/dL (ref 64–422)
IgG (Immunoglobin G), Serum: 497 mg/dL — ABNORMAL LOW (ref 586–1602)
IgM (Immunoglobulin M), Srm: 28 mg/dL (ref 26–217)
Total Protein ELP: 6 g/dL (ref 6.0–8.5)

## 2020-06-09 ENCOUNTER — Other Ambulatory Visit (HOSPITAL_COMMUNITY): Payer: Self-pay

## 2020-06-09 DIAGNOSIS — C9000 Multiple myeloma not having achieved remission: Secondary | ICD-10-CM

## 2020-06-09 MED ORDER — LENALIDOMIDE 15 MG PO CAPS: ORAL_CAPSULE | ORAL | 0 refills | Status: DC

## 2020-06-09 NOTE — Telephone Encounter (Signed)
Chart reviewed. Revlimid refilled per Dr. Delton Coombes

## 2020-06-16 ENCOUNTER — Other Ambulatory Visit (HOSPITAL_COMMUNITY): Payer: Self-pay | Admitting: Hematology

## 2020-06-16 DIAGNOSIS — C9000 Multiple myeloma not having achieved remission: Secondary | ICD-10-CM

## 2020-06-25 ENCOUNTER — Inpatient Hospital Stay (HOSPITAL_COMMUNITY): Payer: Medicare Other | Attending: Hematology

## 2020-06-25 ENCOUNTER — Inpatient Hospital Stay (HOSPITAL_COMMUNITY): Payer: Medicare Other

## 2020-06-25 ENCOUNTER — Other Ambulatory Visit: Payer: Self-pay

## 2020-06-25 VITALS — BP 126/50 | HR 75 | Temp 97.6°F | Resp 16

## 2020-06-25 DIAGNOSIS — C9001 Multiple myeloma in remission: Secondary | ICD-10-CM | POA: Insufficient documentation

## 2020-06-25 DIAGNOSIS — C9000 Multiple myeloma not having achieved remission: Secondary | ICD-10-CM

## 2020-06-25 LAB — CBC WITH DIFFERENTIAL/PLATELET
Abs Immature Granulocytes: 0.02 10*3/uL (ref 0.00–0.07)
Basophils Absolute: 0 10*3/uL (ref 0.0–0.1)
Basophils Relative: 1 %
Eosinophils Absolute: 0.1 10*3/uL (ref 0.0–0.5)
Eosinophils Relative: 3 %
HCT: 32.5 % — ABNORMAL LOW (ref 36.0–46.0)
Hemoglobin: 10.1 g/dL — ABNORMAL LOW (ref 12.0–15.0)
Immature Granulocytes: 1 %
Lymphocytes Relative: 42 %
Lymphs Abs: 1.4 10*3/uL (ref 0.7–4.0)
MCH: 29.4 pg (ref 26.0–34.0)
MCHC: 31.1 g/dL (ref 30.0–36.0)
MCV: 94.5 fL (ref 80.0–100.0)
Monocytes Absolute: 0.7 10*3/uL (ref 0.1–1.0)
Monocytes Relative: 20 %
Neutro Abs: 1.1 10*3/uL — ABNORMAL LOW (ref 1.7–7.7)
Neutrophils Relative %: 33 %
Platelets: 213 10*3/uL (ref 150–400)
RBC: 3.44 MIL/uL — ABNORMAL LOW (ref 3.87–5.11)
RDW: 18 % — ABNORMAL HIGH (ref 11.5–15.5)
WBC: 3.3 10*3/uL — ABNORMAL LOW (ref 4.0–10.5)
nRBC: 0 % (ref 0.0–0.2)

## 2020-06-25 LAB — COMPREHENSIVE METABOLIC PANEL
ALT: 12 U/L (ref 0–44)
AST: 14 U/L — ABNORMAL LOW (ref 15–41)
Albumin: 3.7 g/dL (ref 3.5–5.0)
Alkaline Phosphatase: 58 U/L (ref 38–126)
Anion gap: 8 (ref 5–15)
BUN: 19 mg/dL (ref 8–23)
CO2: 26 mmol/L (ref 22–32)
Calcium: 8.5 mg/dL — ABNORMAL LOW (ref 8.9–10.3)
Chloride: 102 mmol/L (ref 98–111)
Creatinine, Ser: 0.74 mg/dL (ref 0.44–1.00)
GFR, Estimated: >60 mL/min (ref >60)
Glucose, Bld: 106 mg/dL — ABNORMAL HIGH (ref 70–99)
Potassium: 3.5 mmol/L (ref 3.5–5.1)
Sodium: 136 mmol/L (ref 135–145)
Total Bilirubin: 0.3 mg/dL (ref 0.3–1.2)
Total Protein: 6.7 g/dL (ref 6.5–8.1)

## 2020-06-25 LAB — LACTATE DEHYDROGENASE: LDH: 116 U/L (ref 98–192)

## 2020-06-25 MED ORDER — DENOSUMAB 120 MG/1.7ML ~~LOC~~ SOLN
120.0000 mg | Freq: Once | SUBCUTANEOUS | Status: AC
Start: 2020-06-25 — End: 2020-06-25
  Administered 2020-06-25: 120 mg via SUBCUTANEOUS
  Filled 2020-06-25: qty 1.7

## 2020-06-25 NOTE — Progress Notes (Signed)
Patient denies any dental work needed, denies any problems with her gums or teeth. Patient states she is taking her calcium and vit. D.

## 2020-06-25 NOTE — Patient Instructions (Signed)
[facility] at [facility]  Discharge Instructions:   _______________________________________________________________  Thank you for choosing [facility] at [facility] to provide your oncology and hematology care.  To afford each patient quality time with our providers, please arrive at least 15 minutes before your scheduled appointment.  You need to re-schedule your appointment if you arrive 10 or more minutes late.  We strive to give you quality time with our providers, and arriving late affects you and other patients whose appointments are after yours.  Also, if you no show three or more times for appointments you may be dismissed from the clinic.  Again, thank you for choosing [facility] at [facility]. Our hope is that these requests will allow you access to exceptional care and in a timely manner. _______________________________________________________________  If you have questions after your visit, please contact our office at [phone] between the hours of 8:30 a.m. and 5:00 p.m. Voicemails left after 4:30 p.m. will not be returned until the following business day. _______________________________________________________________  For prescription refill requests, have your pharmacy contact our office. _______________________________________________________________  Recommendations made by the consultant and any test results will be sent to your referring physician. _______________________________________________________________ 

## 2020-06-26 LAB — KAPPA/LAMBDA LIGHT CHAINS
Kappa free light chain: 20.6 mg/L — ABNORMAL HIGH (ref 3.3–19.4)
Kappa, lambda light chain ratio: 1.75 — ABNORMAL HIGH (ref 0.26–1.65)
Lambda free light chains: 11.8 mg/L (ref 5.7–26.3)

## 2020-06-26 LAB — PROTEIN ELECTROPHORESIS, SERUM
A/G Ratio: 1.2 (ref 0.7–1.7)
Albumin ELP: 3.4 g/dL (ref 2.9–4.4)
Alpha-1-Globulin: 0.3 g/dL (ref 0.0–0.4)
Alpha-2-Globulin: 0.9 g/dL (ref 0.4–1.0)
Beta Globulin: 1.2 g/dL (ref 0.7–1.3)
Gamma Globulin: 0.5 g/dL (ref 0.4–1.8)
Globulin, Total: 2.8 g/dL (ref 2.2–3.9)
Total Protein ELP: 6.2 g/dL (ref 6.0–8.5)

## 2020-06-27 LAB — IMMUNOFIXATION ELECTROPHORESIS
IgA: 161 mg/dL (ref 64–422)
IgG (Immunoglobin G), Serum: 462 mg/dL — ABNORMAL LOW (ref 586–1602)
IgM (Immunoglobulin M), Srm: 33 mg/dL (ref 26–217)
Total Protein ELP: 6.3 g/dL (ref 6.0–8.5)

## 2020-06-30 ENCOUNTER — Other Ambulatory Visit (HOSPITAL_COMMUNITY): Payer: Self-pay

## 2020-06-30 DIAGNOSIS — C9000 Multiple myeloma not having achieved remission: Secondary | ICD-10-CM

## 2020-06-30 MED ORDER — LENALIDOMIDE 15 MG PO CAPS: ORAL_CAPSULE | ORAL | 0 refills | Status: DC

## 2020-06-30 NOTE — Telephone Encounter (Signed)
Chart reviewed. Revlimid refilled per Dr. Delton Coombes

## 2020-07-17 ENCOUNTER — Other Ambulatory Visit: Payer: Self-pay

## 2020-07-17 ENCOUNTER — Inpatient Hospital Stay (HOSPITAL_COMMUNITY): Payer: Medicare Other | Attending: Hematology

## 2020-07-17 DIAGNOSIS — C9000 Multiple myeloma not having achieved remission: Secondary | ICD-10-CM | POA: Diagnosis not present

## 2020-07-17 DIAGNOSIS — D649 Anemia, unspecified: Secondary | ICD-10-CM | POA: Insufficient documentation

## 2020-07-17 DIAGNOSIS — C9001 Multiple myeloma in remission: Secondary | ICD-10-CM

## 2020-07-17 LAB — CBC WITH DIFFERENTIAL/PLATELET
Abs Immature Granulocytes: 0.03 10*3/uL (ref 0.00–0.07)
Basophils Absolute: 0 10*3/uL (ref 0.0–0.1)
Basophils Relative: 1 %
Eosinophils Absolute: 0 10*3/uL (ref 0.0–0.5)
Eosinophils Relative: 2 %
HCT: 33.1 % — ABNORMAL LOW (ref 36.0–46.0)
Hemoglobin: 10.2 g/dL — ABNORMAL LOW (ref 12.0–15.0)
Immature Granulocytes: 1 %
Lymphocytes Relative: 22 %
Lymphs Abs: 0.5 10*3/uL — ABNORMAL LOW (ref 0.7–4.0)
MCH: 29.9 pg (ref 26.0–34.0)
MCHC: 30.8 g/dL (ref 30.0–36.0)
MCV: 97.1 fL (ref 80.0–100.0)
Monocytes Absolute: 0.2 10*3/uL (ref 0.1–1.0)
Monocytes Relative: 8 %
Neutro Abs: 1.6 10*3/uL — ABNORMAL LOW (ref 1.7–7.7)
Neutrophils Relative %: 66 %
Platelets: 185 10*3/uL (ref 150–400)
RBC: 3.41 MIL/uL — ABNORMAL LOW (ref 3.87–5.11)
RDW: 17 % — ABNORMAL HIGH (ref 11.5–15.5)
WBC: 2.4 10*3/uL — ABNORMAL LOW (ref 4.0–10.5)
nRBC: 0 % (ref 0.0–0.2)

## 2020-07-17 LAB — COMPREHENSIVE METABOLIC PANEL
ALT: 14 U/L (ref 0–44)
AST: 14 U/L — ABNORMAL LOW (ref 15–41)
Albumin: 3.7 g/dL (ref 3.5–5.0)
Alkaline Phosphatase: 66 U/L (ref 38–126)
Anion gap: 7 (ref 5–15)
BUN: 13 mg/dL (ref 8–23)
CO2: 27 mmol/L (ref 22–32)
Calcium: 8.6 mg/dL — ABNORMAL LOW (ref 8.9–10.3)
Chloride: 104 mmol/L (ref 98–111)
Creatinine, Ser: 0.72 mg/dL (ref 0.44–1.00)
GFR, Estimated: >60 mL/min (ref >60)
Glucose, Bld: 128 mg/dL — ABNORMAL HIGH (ref 70–99)
Potassium: 3.7 mmol/L (ref 3.5–5.1)
Sodium: 138 mmol/L (ref 135–145)
Total Bilirubin: 0.5 mg/dL (ref 0.3–1.2)
Total Protein: 6.8 g/dL (ref 6.5–8.1)

## 2020-07-18 LAB — KAPPA/LAMBDA LIGHT CHAINS
Kappa free light chain: 18.5 mg/L (ref 3.3–19.4)
Kappa, lambda light chain ratio: 1.68 — ABNORMAL HIGH (ref 0.26–1.65)
Lambda free light chains: 11 mg/L (ref 5.7–26.3)

## 2020-07-19 ENCOUNTER — Other Ambulatory Visit (HOSPITAL_COMMUNITY): Payer: Self-pay | Admitting: Hematology

## 2020-07-19 DIAGNOSIS — C9000 Multiple myeloma not having achieved remission: Secondary | ICD-10-CM

## 2020-07-21 LAB — PROTEIN ELECTROPHORESIS, SERUM
A/G Ratio: 1.3 (ref 0.7–1.7)
Albumin ELP: 3.5 g/dL (ref 2.9–4.4)
Alpha-1-Globulin: 0.3 g/dL (ref 0.0–0.4)
Alpha-2-Globulin: 0.9 g/dL (ref 0.4–1.0)
Beta Globulin: 1.1 g/dL (ref 0.7–1.3)
Gamma Globulin: 0.4 g/dL (ref 0.4–1.8)
Globulin, Total: 2.8 g/dL (ref 2.2–3.9)
Total Protein ELP: 6.3 g/dL (ref 6.0–8.5)

## 2020-07-22 DIAGNOSIS — H2513 Age-related nuclear cataract, bilateral: Secondary | ICD-10-CM | POA: Diagnosis not present

## 2020-07-22 DIAGNOSIS — H5203 Hypermetropia, bilateral: Secondary | ICD-10-CM | POA: Diagnosis not present

## 2020-07-22 NOTE — Progress Notes (Signed)
[place], [place] [address]   CLINIC:  Medical Oncology/Hematology  PCP:  [name], MD [address] / [place] [address] [phone]   REASON FOR VISIT:  Follow-up for IgA kappa plasma cell myeloma  PRIOR THERAPY: Velcade x 9 cycles from 07/04/2019 to 01/10/2020  NGS Results: not done  CURRENT THERAPY: Revlimid 15 mg 2/3 weeks; Xgeva monthly  BRIEF ONCOLOGIC HISTORY:  Oncology History  Multiple myeloma not having achieved remission ([place])  06/07/2019 Initial Diagnosis   Multiple myeloma not having achieved remission ([place])   07/04/2019 - 01/10/2020 Chemotherapy   The patient had dexamethasone (DECADRON) 4 MG tablet, 1 of 1 cycle, Start date: 01/10/2020, End date: -- lenalidomide (REVLIMID) 15 MG capsule, 1 of 1 cycle, Start date: 02/26/2020, End date: -- bortezomib SQ (VELCADE) chemo injection 2.25 mg, 1.3 mg/m2 = 2.25 mg, Subcutaneous,  Once, 9 of 10 cycles Administration: 2.25 mg (07/04/2019), 2.25 mg (07/11/2019), 2.25 mg (07/26/2019), 2.25 mg (08/02/2019), 2.25 mg (08/09/2019), 2.25 mg (08/16/2019), 2.25 mg (08/23/2019), 2.25 mg (08/30/2019), 2.25 mg (09/06/2019), 2.25 mg (09/13/2019), 2.25 mg (09/20/2019), 2.25 mg (09/27/2019), 2.25 mg (10/04/2019), 2.25 mg (10/11/2019), 2.25 mg (10/18/2019), 2.25 mg (10/25/2019), 2.25 mg (11/01/2019), 2.25 mg (11/08/2019), 2.25 mg (11/15/2019), 2.25 mg (11/22/2019), 2.25 mg (11/29/2019), 2.25 mg (12/06/2019), 2.25 mg (12/13/2019), 2.25 mg (12/20/2019), 2.25 mg (12/27/2019), 2.25 mg (01/03/2020), 2.25 mg (01/10/2020)  for chemotherapy treatment.      CANCER STAGING: Cancer Staging No matching staging information was found for the patient.  INTERVAL HISTORY:  [name], a 80 y.o. female, returns for routine follow-up of her IgA kappa plasma cell myeloma. Courtney Arnold was last seen on 05/28/2020.   Today she reports feeling good and is accompanied by her daughter. She reports excellent levels of activity. She  has been taking Revlimid 2 weeks on/1 week off and does not complain of any side-effects. Her daughter reports her diet is high in sugar. She denies any n/v/d/c, tingling, or back pain.   REVIEW OF SYSTEMS:  Review of Systems  Constitutional: Negative for appetite change and fatigue.  Gastrointestinal: Negative for abdominal pain, constipation, diarrhea, nausea and vomiting.  Musculoskeletal: Negative for back pain.  Neurological: Negative for numbness (no tingling).  All other systems reviewed and are negative.   PAST MEDICAL/SURGICAL HISTORY:  Past Medical History:  Diagnosis Date  . Atrial fibrillation ([place]) 2011   Postop, spontaneous conversion to normal sinus after one hour  . Compression fracture 07/24/09   T12; kyphoplasty  . History of echocardiogram 5/11   EF 65%  . Hypertension   . Thyroid disease   . Tobacco abuse    Past Surgical History:  Procedure Laterality Date  . BACK SURGERY    . BACK SURGERY  06/06/2015  . BREAST EXCISIONAL BIOPSY Left    50 years ago  benign  . CHOLECYSTECTOMY N/A 04/25/2015   Procedure: LAPAROSCOPIC CHOLECYSTECTOMY WITH INTRAOPERATIVE CHOLANGIOGRAM;  Surgeon: Mickeal Skinner, MD;  Location: [facility];  Service: General;  Laterality: N/A;  . COLONOSCOPY N/A 11/26/2015   Procedure: COLONOSCOPY;  Surgeon: Daneil Dolin, MD;  Location: [facility];  Service: Endoscopy;  Laterality: N/A;  7:30 am  . ERCP N/A 04/14/2015   Procedure: ENDOSCOPIC RETROGRADE CHOLANGIOPANCREATOGRAPHY (ERCP) Biliary Sphincterotomy, 10x7 stent placement Dilated bilary system just not well seen;  Surgeon: Rogene Houston, MD;  Location: [facility];  Service: Endoscopy;  Laterality: N/A;  . ERCP N/A 06/12/2015   Procedure: ENDOSCOPIC RETROGRADE  CHOLANGIOPANCREATOGRAPHY (ERCP);  Surgeon: Rogene Houston, MD;  Location: [facility];  Service: Endoscopy;  Laterality: N/A;  . ESOPHAGOGASTRODUODENOSCOPY N/A 06/12/2015   Procedure: DIAGNOSTIC ESOPHAGOGASTRODUODENOSCOPY (EGD);   Surgeon: Rogene Houston, MD;  Location: [facility];  Service: Endoscopy;  Laterality: N/A;  . FEMUR IM NAIL Right 05/11/2019   Procedure: RETROGRADE INTRAMEDULLARY NAIL FEMORAL;  Surgeon: Shona Needles, MD;  Location: [facility];  Service: Orthopedics;  Laterality: Right;  . STENT REMOVAL  06/12/2015   Procedure: STENT REMOVAL ;  Surgeon: Rogene Houston, MD;  Location: [facility];  Service: Endoscopy;;    SOCIAL HISTORY:  Social History   Socioeconomic History  . Marital status: Widowed    Spouse name: Not on file  . Number of children: 2  . Years of education: Not on file  . Highest education level: Not on file  Occupational History  . Occupation: retired  Tobacco Use  . Smoking status: Former Smoker    Packs/day: 0.15    Years: 20.00    Pack years: 3.00    Quit date: 03/08/2013    Years since quitting: 7.3  . Smokeless tobacco: Never Used  Vaping Use  . Vaping Use: Never used  Substance and Sexual Activity  . Alcohol use: No    Alcohol/week: 0.0 standard drinks  . Drug use: No  . Sexual activity: Not Currently  Other Topics Concern  . Not on file  Social History Narrative   Active in gardens and does yard work.   Social Determinants of Health   Financial Resource Strain: Not on file  Food Insecurity: No Food Insecurity  . Worried About Charity fundraiser in the Last Year: Never true  . Ran Out of Food in the Last Year: Never true  Transportation Needs: Not on file  Physical Activity: Not on file  Stress: Not on file  Social Connections: Moderately Isolated  . Frequency of Communication with Friends and Family: More than three times a week  . Frequency of Social Gatherings with Friends and Family: More than three times a week  . Attends Religious Services: More than 4 times per year  . Active Member of Clubs or Organizations: No  . Attends Archivist Meetings: Never  . Marital Status: Widowed  Intimate Partner Violence: Not on file    FAMILY  HISTORY:  Family History  Problem Relation Age of Onset  . Heart attack Father   . Stroke Father   . Breast cancer Sister   . Thyroid disease Neg Hx   . Colon cancer Neg Hx     CURRENT MEDICATIONS:  Current Outpatient Medications  Medication Sig Dispense Refill  . acyclovir (ZOVIRAX) 400 MG tablet TAKE (1) TABLET BY MOUTH TWICE DAILY. 60 tablet 6  . aspirin EC 81 MG tablet Take 81 mg by mouth daily.    . bortezomib IV (VELCADE) 3.5 MG injection Inject 1.3 mg/m2 into the vein once a week. Day 1, 8, 15 every 21 days    . carboxymethylcellulose (REFRESH PLUS) 0.5 % SOLN Place 1 drop into both eyes in the morning, at noon, in the evening, and at bedtime.    . Cholecalciferol (VITAMIN D) 125 MCG (5000 UT) CAPS Take 5,000 Units by mouth daily. 90 capsule 1  . cyclobenzaprine (FLEXERIL) 5 MG tablet Take 1 tablet (5 mg total) by mouth 3 (three) times daily as needed for muscle spasms. 30 tablet 0  . dexamethasone (DECADRON) 4 MG tablet Take 5  tablets (20 mg) on days 1, 8, and 15 of chemo. Repeat every 21 days. 30 tablet 3  . lenalidomide (REVLIMID) 15 MG capsule Take one capsule daily on days 1-14 every 21 days. 14 capsule 0  . levothyroxine (SYNTHROID) 112 MCG tablet Take 1 tablet (112 mcg total) by mouth daily. 90 tablet 3  . NON FORMULARY Diet -Regular    . prochlorperazine (COMPAZINE) 10 MG tablet Take 1 tablet (10 mg total) by mouth every 6 (six) hours as needed (Nausea or vomiting). 30 tablet 1   No current facility-administered medications for this visit.    ALLERGIES:  No Known Allergies  PHYSICAL EXAM:  Performance status (ECOG): 1 - Symptomatic but completely ambulatory  There were no vitals filed for this visit. Wt Readings from Last 3 Encounters:  05/28/20 134 lb 3.2 oz (60.9 kg)  04/02/20 141 lb (64 kg)  02/19/20 141 lb (64 kg)   Physical Exam Vitals reviewed.  Constitutional:      Appearance: Normal appearance.  Cardiovascular:     Rate and Rhythm: Normal rate and  regular rhythm.     Pulses: Normal pulses.     Heart sounds: Normal heart sounds.  Pulmonary:     Effort: Pulmonary effort is normal.     Breath sounds: Normal breath sounds.  Abdominal:     Palpations: Abdomen is soft. There is no hepatomegaly, splenomegaly or mass.     Tenderness: There is no abdominal tenderness.  Musculoskeletal:     Right lower leg: No edema.     Left lower leg: No edema.  Neurological:     General: No focal deficit present.     Mental Status: She is alert and oriented to person, place, and time.  Psychiatric:        Mood and Affect: Mood normal.        Behavior: Behavior normal.      LABORATORY DATA:  I have reviewed the labs as listed.  CBC Latest Ref Rng & Units 07/17/2020 06/25/2020 05/26/2020  WBC 4.0 - 10.5 K/uL 2.4(L) 3.3(L) 5.1  Hemoglobin 12.0 - 15.0 g/dL 10.2(L) 10.1(L) 10.6(L)  Hematocrit 36.0 - 46.0 % 33.1(L) 32.5(L) 34.7(L)  Platelets 150 - 400 K/uL 185 213 235   CMP Latest Ref Rng & Units 07/17/2020 06/25/2020 05/26/2020  Glucose 70 - 99 mg/dL 128(H) 106(H) 113(H)  BUN 8 - 23 mg/dL _0 Creatinine 0.44 - 1.00 mg/dL 0.72 0.74 0.77  Sodium 135 - 145 mmol/L 138 136 136  Potassium 3.5 - 5.1 mmol/L 3.7 3.5 3.3(L)  Chloride 98 - 111 mmol/L 104 102 100  CO2 22 - 32 mmol/L _1 Calcium 8.9 - 10.3 mg/dL 8.6(L) 8.5(L) 8.7(L)  Total Protein 6.5 - 8.1 g/dL 6.8 6.7 6.4(L)  Total Bilirubin 0.3 - 1.2 mg/dL 0.5 0.3 0.4  Alkaline Phos 38 - 126 U/L 66 58 61  AST 15 - 41 U/L 14(L) 14(L) 15  ALT 0 - 44 U/L _2 DIAGNOSTIC IMAGING:  I have independently reviewed the scans and discussed with the patient. No results found.   ASSESSMENT:  1. IgA kappa plasma cell myeloma: -IgA kappa plasma cell myeloma diagnosed on right sacral bone biopsy on 05/17/2019. SPEP did not show M spike. Immunofixation shows IgA kappa. Kappa light chains 42.8, lambda light chains at 9.9, ratio 4.32. Beta-2 microglobulin 3.7. -PET scan on 06/04/2019 showed  expansile lytic lesion involving the right sacral wing. Lytic lesion involving T10 vertebral  body. Left seventh rib lesion. Increased uptake in a pathological fracture involving distal aspect of the right femur. 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 -RVD started on 06/26/2019.Revlimid 15 mg 2 weeks on/1 week off. -Velcade discontinued on 01/10/2020. -She is taking Revlimid 15 mg 2 weeks on 1 week off, with dexamethasone 20 mg on weeks of Revlimid and 10 mg while she is off Revlimid.   PLAN:  1. IgA kappa plasma cell myeloma: -She is taking Revlimid 15 mg 2 weeks on/1 week off. - We have cut back dexamethasone to 10 mg weekly. - Reviewed myeloma labs from 07/17/2020.  SPEP was negative.  Free light chain ratio is 1.68.  Immunofixation shows IgA kappa. - Reviewed LFTs which are grossly within normal limits.  Creatinine is 0.72.  CBC shows slightly low white count 2.4 with ANC 1.6. - Recommend continuing same dose of Revlimid and dexamethasone.  RTC 8 weeks for follow-up with repeat myeloma labs.  2. Back pain: -She does not have any back pain at this time.  3. Normocytic anemia: -Hemoglobin has improved to 10.2.  4. ID prophylaxis: -Continue acyclovir twice daily.  Continue aspirin for thromboprophylaxis.  5. Myeloma bone disease: -Continue denosumab monthly.  Continue calcium supplements daily.   Orders placed this encounter:  No orders of the defined types were placed in this encounter.    Derek Jack, MD [place] [phone]   I, Thana Ates, am acting as a scribe for Dr. Derek Jack.  I, Derek Jack MD, have reviewed the above documentation for accuracy and completeness, and I agree with the above.

## 2020-07-23 ENCOUNTER — Inpatient Hospital Stay (HOSPITAL_BASED_OUTPATIENT_CLINIC_OR_DEPARTMENT_OTHER): Payer: Medicare Other | Admitting: Hematology

## 2020-07-23 ENCOUNTER — Other Ambulatory Visit: Payer: Self-pay

## 2020-07-23 ENCOUNTER — Inpatient Hospital Stay (HOSPITAL_COMMUNITY): Payer: Medicare Other

## 2020-07-23 VITALS — BP 108/58 | HR 78 | Temp 97.0°F | Resp 18 | Wt 139.9 lb

## 2020-07-23 DIAGNOSIS — C9001 Multiple myeloma in remission: Secondary | ICD-10-CM | POA: Diagnosis not present

## 2020-07-23 DIAGNOSIS — D649 Anemia, unspecified: Secondary | ICD-10-CM | POA: Diagnosis not present

## 2020-07-23 DIAGNOSIS — C9000 Multiple myeloma not having achieved remission: Secondary | ICD-10-CM | POA: Diagnosis not present

## 2020-07-23 MED ORDER — DENOSUMAB 120 MG/1.7ML ~~LOC~~ SOLN
120.0000 mg | Freq: Once | SUBCUTANEOUS | Status: AC
  Administered 2020-07-23: 120 mg via SUBCUTANEOUS
  Filled 2020-07-23: qty 1.7

## 2020-07-23 NOTE — Progress Notes (Signed)
.  Courtney Arnold presents today for injection per the provider's orders.  Xgeva administration without incident; injection site WNL; see MAR for injection details. Pt denies any tooth or jaw pain and no recent dental appointments. Pt reports taking calcium and vit D as directed. Pt in stable condition before and after discharge. Pt accompanied by daughter follow-up appointments given at check out.Pt denies any tooth or jaw pain and no recent dental appointments.

## 2020-07-23 NOTE — Patient Instructions (Signed)
[facility] at [facility] Discharge Instructions  You were seen today by Dr. Katragadda. He went over your recent results, and you received your treatment. Dr. Katragadda will see you back in 8 weeks for labs and follow up.   Thank you for choosing  [facility] at [facility] to provide your oncology and hematology care.  To afford each patient quality time with our provider, please arrive at least 15 minutes before your scheduled appointment time.   If you have a lab appointment with the Cancer Center please come in thru the Main Entrance and check in at the main information desk  You need to re-schedule your appointment should you arrive 10 or more minutes late.  We strive to give you quality time with our providers, and arriving late affects you and other patients whose appointments are after yours.  Also, if you no show three or more times for appointments you may be dismissed from the clinic at the providers discretion.     Again, thank you for choosing [facility].  Our hope is that these requests will decrease the amount of time that you wait before being seen by our physicians.       _____________________________________________________________  Should you have questions after your visit to  Cancer Center, please contact our office at [phone] between the hours of 8:00 a.m. and 4:30 p.m.  Voicemails left after 4:00 p.m. will not be returned until the following business day.  For prescription refill requests, have your pharmacy contact our office and allow 72 hours.    Cancer Center Support Programs:   > Cancer Support Group  2nd Tuesday of the month 1pm-2pm, Journey Room   

## 2020-07-24 LAB — IMMUNOFIXATION ELECTROPHORESIS
IgA: 155 mg/dL (ref 64–422)
IgG (Immunoglobin G), Serum: 469 mg/dL — ABNORMAL LOW (ref 586–1602)
IgM (Immunoglobulin M), Srm: 33 mg/dL (ref 26–217)
Total Protein ELP: 6.1 g/dL (ref 6.0–8.5)

## 2020-07-25 ENCOUNTER — Other Ambulatory Visit (HOSPITAL_COMMUNITY): Payer: Self-pay

## 2020-07-25 DIAGNOSIS — C9000 Multiple myeloma not having achieved remission: Secondary | ICD-10-CM

## 2020-07-25 MED ORDER — LENALIDOMIDE 15 MG PO CAPS: ORAL_CAPSULE | ORAL | 0 refills | Status: DC

## 2020-07-25 NOTE — Telephone Encounter (Signed)
Chart reviewed. Revlimid refilled per Dr. Delton Coombes

## 2020-07-29 DIAGNOSIS — Z23 Encounter for immunization: Secondary | ICD-10-CM | POA: Diagnosis not present

## 2020-08-11 ENCOUNTER — Other Ambulatory Visit (HOSPITAL_COMMUNITY): Payer: Self-pay

## 2020-08-11 DIAGNOSIS — C9000 Multiple myeloma not having achieved remission: Secondary | ICD-10-CM

## 2020-08-11 MED ORDER — LENALIDOMIDE 15 MG PO CAPS: ORAL_CAPSULE | ORAL | 0 refills | Status: DC

## 2020-08-11 NOTE — Telephone Encounter (Signed)
Chart reviewed. Revlimid refilled per Dr. Delton Coombes

## 2020-08-18 DIAGNOSIS — H2511 Age-related nuclear cataract, right eye: Secondary | ICD-10-CM | POA: Diagnosis not present

## 2020-08-18 DIAGNOSIS — H25811 Combined forms of age-related cataract, right eye: Secondary | ICD-10-CM | POA: Diagnosis not present

## 2020-08-20 ENCOUNTER — Other Ambulatory Visit: Payer: Self-pay

## 2020-08-20 ENCOUNTER — Inpatient Hospital Stay (HOSPITAL_COMMUNITY): Payer: Medicare Other

## 2020-08-20 ENCOUNTER — Inpatient Hospital Stay (HOSPITAL_COMMUNITY): Payer: Medicare Other | Attending: Hematology

## 2020-08-20 VITALS — BP 116/51 | HR 71 | Temp 96.8°F | Resp 18

## 2020-08-20 DIAGNOSIS — C9000 Multiple myeloma not having achieved remission: Secondary | ICD-10-CM | POA: Diagnosis not present

## 2020-08-20 LAB — COMPREHENSIVE METABOLIC PANEL
ALT: 20 U/L (ref 0–44)
AST: 17 U/L (ref 15–41)
Albumin: 3.8 g/dL (ref 3.5–5.0)
Alkaline Phosphatase: 66 U/L (ref 38–126)
Anion gap: 7 (ref 5–15)
BUN: 23 mg/dL (ref 8–23)
CO2: 28 mmol/L (ref 22–32)
Calcium: 8.4 mg/dL — ABNORMAL LOW (ref 8.9–10.3)
Chloride: 101 mmol/L (ref 98–111)
Creatinine, Ser: 0.87 mg/dL (ref 0.44–1.00)
GFR, Estimated: >60 mL/min (ref >60)
Glucose, Bld: 92 mg/dL (ref 70–99)
Potassium: 3.6 mmol/L (ref 3.5–5.1)
Sodium: 136 mmol/L (ref 135–145)
Total Bilirubin: 0.4 mg/dL (ref 0.3–1.2)
Total Protein: 6.4 g/dL — ABNORMAL LOW (ref 6.5–8.1)

## 2020-08-20 MED ORDER — DENOSUMAB 120 MG/1.7ML ~~LOC~~ SOLN
120.0000 mg | Freq: Once | SUBCUTANEOUS | Status: AC
  Administered 2020-08-20: 120 mg via SUBCUTANEOUS

## 2020-08-20 MED ORDER — DENOSUMAB 120 MG/1.7ML ~~LOC~~ SOLN
SUBCUTANEOUS | Status: AC
  Filled 2020-08-20: qty 1.7

## 2020-08-20 NOTE — Progress Notes (Signed)
Patient tolerated Xgeva 120 mg injection with no complaints voiced.  Site clean and dry with no bruising or swelling noted.  No complaints of pain.  Discharged with vital signs stable and no signs or symptoms of distress noted.

## 2020-08-27 ENCOUNTER — Other Ambulatory Visit (HOSPITAL_COMMUNITY): Payer: Self-pay

## 2020-08-27 DIAGNOSIS — C9000 Multiple myeloma not having achieved remission: Secondary | ICD-10-CM

## 2020-08-27 MED ORDER — LENALIDOMIDE 15 MG PO CAPS: ORAL_CAPSULE | ORAL | 0 refills | Status: DC

## 2020-08-27 NOTE — Telephone Encounter (Signed)
Chart reviewed. Revlimid refilled per Dr. Delton Coombes

## 2020-09-17 ENCOUNTER — Other Ambulatory Visit (HOSPITAL_COMMUNITY): Payer: Self-pay | Admitting: *Deleted

## 2020-09-17 ENCOUNTER — Encounter (HOSPITAL_COMMUNITY): Payer: Self-pay | Admitting: Hematology and Oncology

## 2020-09-17 ENCOUNTER — Inpatient Hospital Stay (HOSPITAL_COMMUNITY): Payer: Medicare Other

## 2020-09-17 ENCOUNTER — Other Ambulatory Visit (HOSPITAL_COMMUNITY): Payer: Self-pay

## 2020-09-17 ENCOUNTER — Inpatient Hospital Stay (HOSPITAL_COMMUNITY): Payer: Medicare Other | Attending: Hematology | Admitting: Hematology and Oncology

## 2020-09-17 ENCOUNTER — Other Ambulatory Visit: Payer: Self-pay

## 2020-09-17 DIAGNOSIS — C9000 Multiple myeloma not having achieved remission: Secondary | ICD-10-CM

## 2020-09-17 DIAGNOSIS — C9001 Multiple myeloma in remission: Secondary | ICD-10-CM

## 2020-09-17 DIAGNOSIS — D61818 Other pancytopenia: Secondary | ICD-10-CM | POA: Diagnosis not present

## 2020-09-17 DIAGNOSIS — Z9119 Patient's noncompliance with other medical treatment and regimen: Secondary | ICD-10-CM | POA: Insufficient documentation

## 2020-09-17 DIAGNOSIS — D649 Anemia, unspecified: Secondary | ICD-10-CM

## 2020-09-17 LAB — COMPREHENSIVE METABOLIC PANEL
ALT: 13 U/L (ref 0–44)
AST: 12 U/L — ABNORMAL LOW (ref 15–41)
Albumin: 3.7 g/dL (ref 3.5–5.0)
Alkaline Phosphatase: 58 U/L (ref 38–126)
Anion gap: 5 (ref 5–15)
BUN: 19 mg/dL (ref 8–23)
CO2: 28 mmol/L (ref 22–32)
Calcium: 8.3 mg/dL — ABNORMAL LOW (ref 8.9–10.3)
Chloride: 103 mmol/L (ref 98–111)
Creatinine, Ser: 0.85 mg/dL (ref 0.44–1.00)
GFR, Estimated: >60 mL/min (ref >60)
Glucose, Bld: 112 mg/dL — ABNORMAL HIGH (ref 70–99)
Potassium: 3.7 mmol/L (ref 3.5–5.1)
Sodium: 136 mmol/L (ref 135–145)
Total Bilirubin: 0.5 mg/dL (ref 0.3–1.2)
Total Protein: 6.5 g/dL (ref 6.5–8.1)

## 2020-09-17 LAB — IRON AND TIBC
Iron: 51 ug/dL (ref 28–170)
Saturation Ratios: 12 % (ref 10.4–31.8)
TIBC: 411 ug/dL (ref 250–450)
UIBC: 360 ug/dL

## 2020-09-17 LAB — CBC WITH DIFFERENTIAL/PLATELET
Abs Immature Granulocytes: 0.02 K/uL (ref 0.00–0.07)
Basophils Absolute: 0 K/uL (ref 0.0–0.1)
Basophils Relative: 1 %
Eosinophils Absolute: 0.2 K/uL (ref 0.0–0.5)
Eosinophils Relative: 4 %
HCT: 32.4 % — ABNORMAL LOW (ref 36.0–46.0)
Hemoglobin: 10.2 g/dL — ABNORMAL LOW (ref 12.0–15.0)
Immature Granulocytes: 1 %
Lymphocytes Relative: 39 %
Lymphs Abs: 1.4 K/uL (ref 0.7–4.0)
MCH: 30.5 pg (ref 26.0–34.0)
MCHC: 31.5 g/dL (ref 30.0–36.0)
MCV: 97 fL (ref 80.0–100.0)
Monocytes Absolute: 0.6 K/uL (ref 0.1–1.0)
Monocytes Relative: 19 %
Neutro Abs: 1.2 K/uL — ABNORMAL LOW (ref 1.7–7.7)
Neutrophils Relative %: 36 %
Platelets: 220 K/uL (ref 150–400)
RBC: 3.34 MIL/uL — ABNORMAL LOW (ref 3.87–5.11)
RDW: 16.7 % — ABNORMAL HIGH (ref 11.5–15.5)
WBC: 3.4 K/uL — ABNORMAL LOW (ref 4.0–10.5)
nRBC: 0 % (ref 0.0–0.2)

## 2020-09-17 LAB — FERRITIN: Ferritin: 42 ng/mL (ref 11–307)

## 2020-09-17 MED ORDER — LENALIDOMIDE 15 MG PO CAPS: ORAL_CAPSULE | ORAL | 0 refills | Status: DC

## 2020-09-17 MED ORDER — DEXAMETHASONE 4 MG PO TABS: ORAL_TABLET | ORAL | 3 refills | Status: DC

## 2020-09-17 NOTE — Assessment & Plan Note (Signed)
Her recent myeloma show excellent myeloma control She will continue Revlimid I recommend gentle taper of dexamethasone to 8 mg once a week Due to noncompliance with calcium and vitamin D supplement and progressive hypocalcemia, I will hold her Delton See today I reminded her the importance of getting dental evaluation while on Xgeva She is reminded to take calcium with vitamin D supplement

## 2020-09-17 NOTE — Telephone Encounter (Signed)
Chart reviewed. Revlimid refilled per Dr. Tomie China last office note.

## 2020-09-17 NOTE — Assessment & Plan Note (Signed)
She is noncompliant taking calcium with vitamin D supplement I will cancel her Xgeva injection today I told her deferring Xgeva injection will not compromise her treatment

## 2020-09-17 NOTE — Assessment & Plan Note (Signed)
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

## 2020-09-17 NOTE — Progress Notes (Signed)
[facility] progress notes  Patient Care Team: [name], MD as PCP - General (Family Medicine) Gala Romney, Cristopher Estimable, MD as Consulting Physician (Gastroenterology) Donetta Potts, RN as Oncology Nurse Navigator (Oncology) Derek Jack, MD as Medical Oncologist (Oncology)  CHIEF COMPLAINTS/PURPOSE OF VISIT:  Multiple myeloma, for further evaluation  HISTORY OF PRESENTING ILLNESS:  Courtney Arnold 80 y.o. female is seen because of her oncologist not available She is here accompanied by her daughter She is currently receiving dexamethasone with Revlimid She has not seen a dentist for 9 months She stated she has not been compliant taking calcium with vitamin D supplement She denies recent bone pain No recent infection No recent dental issues  I reviewed the patient's records extensive and collaborated the history with the patient. Summary of her history is as follows: Oncology History  Multiple myeloma not having achieved remission ([place])  06/07/2019 Initial Diagnosis   Multiple myeloma not having achieved remission ([place])    07/04/2019 - 01/10/2020 Chemotherapy   The patient had dexamethasone (DECADRON) 4 MG tablet, 1 of 1 cycle, Start date: 01/10/2020, End date: -- lenalidomide (REVLIMID) 15 MG capsule, 1 of 1 cycle, Start date: 02/26/2020, End date: -- bortezomib SQ (VELCADE) chemo injection 2.25 mg, 1.3 mg/m2 = 2.25 mg, Subcutaneous,  Once, 9 of 10 cycles Administration: 2.25 mg (07/04/2019), 2.25 mg (07/11/2019), 2.25 mg (07/26/2019), 2.25 mg (08/02/2019), 2.25 mg (08/09/2019), 2.25 mg (08/16/2019), 2.25 mg (08/23/2019), 2.25 mg (08/30/2019), 2.25 mg (09/06/2019), 2.25 mg (09/13/2019), 2.25 mg (09/20/2019), 2.25 mg (09/27/2019), 2.25 mg (10/04/2019), 2.25 mg (10/11/2019), 2.25 mg (10/18/2019), 2.25 mg (10/25/2019), 2.25 mg (11/01/2019), 2.25 mg (11/08/2019), 2.25 mg (11/15/2019), 2.25 mg (11/22/2019), 2.25 mg (11/29/2019), 2.25 mg (12/06/2019), 2.25 mg (12/13/2019), 2.25 mg  (12/20/2019), 2.25 mg (12/27/2019), 2.25 mg (01/03/2020), 2.25 mg (01/10/2020)   for chemotherapy treatment.       MEDICAL HISTORY:  Past Medical History:  Diagnosis Date   Atrial fibrillation ([place]) 2011   Postop, spontaneous conversion to normal sinus after one hour   Compression fracture 07/24/09   T12; kyphoplasty   History of echocardiogram 5/11   EF 65%   Hypertension    Thyroid disease    Tobacco abuse     SURGICAL HISTORY: Past Surgical History:  Procedure Laterality Date   BACK SURGERY     BACK SURGERY  06/06/2015   BREAST EXCISIONAL BIOPSY Left    50 years ago  benign   CHOLECYSTECTOMY N/A 04/25/2015   Procedure: LAPAROSCOPIC CHOLECYSTECTOMY WITH INTRAOPERATIVE CHOLANGIOGRAM;  Surgeon: Mickeal Skinner, MD;  [facility];  Service: General;  Laterality: N/A;   COLONOSCOPY N/A 11/26/2015   Procedure: COLONOSCOPY;  Surgeon: Daneil Dolin, MD;  [facility];  Service: Endoscopy;  Laterality: N/A;  7:30 am   ERCP N/A 04/14/2015   Procedure: ENDOSCOPIC RETROGRADE CHOLANGIOPANCREATOGRAPHY (ERCP) Biliary Sphincterotomy, 10x7 stent placement Dilated bilary system just not well seen;  Surgeon: Rogene Houston, MD;  [facility];  Service: Endoscopy;  Laterality: N/A;   ERCP N/A 06/12/2015   Procedure: ENDOSCOPIC RETROGRADE CHOLANGIOPANCREATOGRAPHY (ERCP);  Surgeon: Rogene Houston, MD;  [facility];  Service: Endoscopy;  Laterality: N/A;   ESOPHAGOGASTRODUODENOSCOPY N/A 06/12/2015   Procedure: DIAGNOSTIC ESOPHAGOGASTRODUODENOSCOPY (EGD);  Surgeon: Rogene Houston, MD;  [facility];  Service: Endoscopy;  Laterality: N/A;   FEMUR IM NAIL Right 05/11/2019   Procedure: RETROGRADE INTRAMEDULLARY NAIL FEMORAL;  Surgeon: Shona Needles, MD;  Location: [facility];  Service:  Orthopedics;  Laterality: Right;   STENT REMOVAL  06/12/2015   Procedure: STENT REMOVAL ;  Surgeon: Rogene Houston, MD;  [facility];  Service: Endoscopy;;    SOCIAL  HISTORY: Social History   Socioeconomic History   Marital status: Widowed    Spouse name: Not on file   Number of children: 2   Years of education: Not on file   Highest education level: Not on file  Occupational History   Occupation: retired  Tobacco Use   Smoking status: Former    Packs/day: 0.15    Years: 20.00    Pack years: 3.00    Types: Cigarettes    Quit date: 03/08/2013    Years since quitting: 7.5   Smokeless tobacco: Never  Vaping Use   Vaping Use: Never used  Substance and Sexual Activity   Alcohol use: No    Alcohol/week: 0.0 standard drinks   Drug use: No   Sexual activity: Not Currently  Other Topics Concern   Not on file  Social History Narrative   Active in gardens and does yard work.   Social Determinants of Health   Financial Resource Strain: Not on file  Food Insecurity: No Food Insecurity   Worried About Charity fundraiser in the Last Year: Never true   Ran Out of Food in the Last Year: Never true  Transportation Needs: Not on file  Physical Activity: Not on file  Stress: Not on file  Social Connections: Moderately Isolated   Frequency of Communication with Friends and Family: More than three times a week   Frequency of Social Gatherings with Friends and Family: More than three times a week   Attends Religious Services: More than 4 times per year   Active Member of [employer] or Organizations: No   Attends Archivist Meetings: Never   Marital Status: Widowed  Human resources officer Violence: Not on file    FAMILY HISTORY: Family History  Problem Relation Age of Onset   Heart attack Father    Stroke Father    Breast cancer Sister    Thyroid disease Neg Hx    Colon cancer Neg Hx     ALLERGIES:  has No Known Allergies.  MEDICATIONS:  Current Outpatient Medications  Medication Sig Dispense Refill   acyclovir (ZOVIRAX) 400 MG tablet TAKE (1) TABLET BY MOUTH TWICE DAILY. 60 tablet 6   aspirin EC 81 MG tablet Take 81 mg by mouth daily.      bortezomib IV (VELCADE) 3.5 MG injection Inject 1.3 mg/m2 into the vein once a week. Day 1, 8, 15 every 21 days     carboxymethylcellulose (REFRESH PLUS) 0.5 % SOLN Place 1 drop into both eyes in the morning, at noon, in the evening, and at bedtime.     Cholecalciferol (VITAMIN D) 125 MCG (5000 UT) CAPS Take 5,000 Units by mouth daily. 90 capsule 1   cyclobenzaprine (FLEXERIL) 5 MG tablet Take 1 tablet (5 mg total) by mouth 3 (three) times daily as needed for muscle spasms. (Patient not taking: Reported on 09/17/2020) 30 tablet 0   dexamethasone (DECADRON) 4 MG tablet Take 2 tablets once a week on Thursdays 30 tablet 3   lenalidomide (REVLIMID) 15 MG capsule Take one capsule daily on days 1-14 every 21 days. 14 capsule 0   levothyroxine (SYNTHROID) 112 MCG tablet Take 1 tablet (112 mcg total) by mouth daily. 90 tablet 3   moxifloxacin (VIGAMOX) 0.5 % ophthalmic solution Place 1 drop into  the right eye 4 (four) times daily.     NON FORMULARY Diet -Regular     prednisoLONE acetate (PRED FORTE) 1 % ophthalmic suspension Place 1 drop into the right eye 4 (four) times daily.     prochlorperazine (COMPAZINE) 10 MG tablet Take 1 tablet (10 mg total) by mouth every 6 (six) hours as needed (Nausea or vomiting). (Patient not taking: Reported on 09/17/2020) 30 tablet 1   No current facility-administered medications for this visit.    REVIEW OF SYSTEMS:   Constitutional: Denies fevers, chills or abnormal night sweats Eyes: Denies blurriness of vision, double vision or watery eyes Ears, nose, mouth, throat, and face: Denies mucositis or sore throat Respiratory: Denies cough, dyspnea or wheezes Cardiovascular: Denies palpitation, chest discomfort or lower extremity swelling Gastrointestinal:  Denies nausea, heartburn or change in bowel habits Skin: Denies abnormal skin rashes Lymphatics: Denies new lymphadenopathy or easy bruising Neurological:Denies numbness, tingling or new weaknesses Behavioral/Psych:  Mood is stable, no new changes  All other systems were reviewed with the patient and are negative.  PHYSICAL EXAMINATION: ECOG PERFORMANCE STATUS: 1 - Symptomatic but completely ambulatory  Vitals:   09/17/20 1449  BP: (!) 93/51  Pulse: 78  Resp: 18  Temp: (!) 96.8 F (36 C)  SpO2: 99%   Filed Weights   09/17/20 1449  Weight: 140 lb 3.2 oz (63.6 kg)    GENERAL:alert, no distress and comfortable NEURO: no focal motor/sensory deficits  LABORATORY DATA:  I have reviewed the data as listed Lab Results  Component Value Date   WBC 3.4 (L) 09/17/2020   HGB 10.2 (L) 09/17/2020   HCT 32.4 (L) 09/17/2020   MCV 97.0 09/17/2020   PLT 220 09/17/2020   Recent Labs    11/22/19 1259 11/29/19 1247 12/06/19 1347 12/13/19 1321 07/17/20 0916 08/20/20 1343 09/17/20 1317  NA 133* 136 136   < > 138 136 136  K 4.0 4.2 3.6   < > 3.7 3.6 3.7  CL 95* 98 96*   < > 104 101 103  CO2 28 29 29    < > 27 28 28   GLUCOSE 154* 155* 151*   < > 128* 92 112*  BUN 19 13 18    < > 13 23 19   CREATININE 0.81 0.73 0.81   < > 0.72 0.87 0.85  CALCIUM 9.4 9.9 9.4   < > 8.6* 8.4* 8.3*  GFRNONAA >60 >60 >60   < > >60 >60 >60  GFRAA >60 >60 >60  --   --   --   --   PROT 6.7 6.8 6.8   < > 6.8 6.4* 6.5  ALBUMIN 3.8 3.7 3.8   < > 3.7 3.8 3.7  AST 29 18 28    < > 14* 17 12*  ALT 38 23 26   < > 14 20 13   ALKPHOS 90 89 98   < > 66 66 58  BILITOT 0.6 0.4 0.3   < > 0.5 0.4 0.5   < > = values in this interval not displayed.    RADIOGRAPHIC STUDIES: I have personally reviewed the radiological images as listed and agreed with the findings in the report. No results found.  ASSESSMENT & PLAN:  Multiple myeloma not having achieved remission ([place]) Her recent myeloma show excellent myeloma control She will continue Revlimid I recommend gentle taper of dexamethasone to 8 mg once a week Due to noncompliance with calcium and vitamin D supplement and progressive hypocalcemia, I will hold her Delton See  today I reminded her  the importance of getting dental evaluation while on Xgeva She is reminded to take calcium with vitamin D supplement  Pancytopenia, acquired ([place]) Pancytopenia is due to Revlimid She is not symptomatic Observe only She will continue same dose   Hypocalcemia She is noncompliant taking calcium with vitamin D supplement I will cancel her Xgeva injection today I told her deferring Xgeva injection will not compromise her treatment  Orders Placed This Encounter  Procedures   CBC with Differential    Standing Status:   Standing    Number of Occurrences:   20    Standing Expiration Date:   09/17/2021   Comprehensive metabolic panel    Standing Status:   Standing    Number of Occurrences:   20    Standing Expiration Date:   09/17/2021   Lactate dehydrogenase    Standing Status:   Standing    Number of Occurrences:   20    Standing Expiration Date:   09/17/2021   Kappa/lambda light chains    Standing Status:   Standing    Number of Occurrences:   20    Standing Expiration Date:   09/17/2021   Immunofixation electrophoresis    Standing Status:   Standing    Number of Occurrences:   20    Standing Expiration Date:   09/17/2021   Protein electrophoresis, serum    Standing Status:   Standing    Number of Occurrences:   20    Standing Expiration Date:   09/17/2021   Iron and TIBC    Standing Status:   Standing    Number of Occurrences:   20    Standing Expiration Date:   09/17/2021   Ferritin    Standing Status:   Standing    Number of Occurrences:   20    Standing Expiration Date:   09/17/2021    All questions were answered. The patient knows to call the clinic with any problems, questions or concerns. The total time spent in the appointment was 20 minutes encounter with patients including review of chart and various tests results, discussions about plan of care and coordination of care plan   Heath Lark, MD 09/17/2020 5:28 PM

## 2020-09-18 LAB — KAPPA/LAMBDA LIGHT CHAINS
Kappa free light chain: 18.3 mg/L (ref 3.3–19.4)
Kappa, lambda light chain ratio: 1.83 — ABNORMAL HIGH (ref 0.26–1.65)
Lambda free light chains: 10 mg/L (ref 5.7–26.3)

## 2020-09-22 LAB — PROTEIN ELECTROPHORESIS, SERUM
A/G Ratio: 1.4 (ref 0.7–1.7)
Albumin ELP: 3.5 g/dL (ref 2.9–4.4)
Alpha-1-Globulin: 0.3 g/dL (ref 0.0–0.4)
Alpha-2-Globulin: 0.8 g/dL (ref 0.4–1.0)
Beta Globulin: 1.1 g/dL (ref 0.7–1.3)
Gamma Globulin: 0.3 g/dL — ABNORMAL LOW (ref 0.4–1.8)
Globulin, Total: 2.5 g/dL (ref 2.2–3.9)
Total Protein ELP: 6 g/dL (ref 6.0–8.5)

## 2020-09-22 LAB — IMMUNOFIXATION ELECTROPHORESIS
IgA: 156 mg/dL (ref 64–422)
IgG (Immunoglobin G), Serum: 375 mg/dL — ABNORMAL LOW (ref 586–1602)
IgM (Immunoglobulin M), Srm: 22 mg/dL — ABNORMAL LOW (ref 26–217)
Total Protein ELP: 6 g/dL (ref 6.0–8.5)

## 2020-10-13 ENCOUNTER — Other Ambulatory Visit (HOSPITAL_COMMUNITY): Payer: Self-pay

## 2020-10-13 DIAGNOSIS — C9000 Multiple myeloma not having achieved remission: Secondary | ICD-10-CM

## 2020-10-13 MED ORDER — LENALIDOMIDE 15 MG PO CAPS: ORAL_CAPSULE | ORAL | 0 refills | Status: DC

## 2020-10-13 NOTE — Telephone Encounter (Signed)
Chart reviewed. Revlimid refilled per last office visit with Dr. Alvy Bimler

## 2020-10-19 NOTE — Progress Notes (Signed)
[place], [place] [address]   CLINIC:  Medical Oncology/Hematology  PCP:  [name], MD [address] / [place] [address] [phone]   REASON FOR VISIT:  Follow-up for IgA kappa plasma cell myeloma  PRIOR THERAPY: Velcade x 9 cycles from 07/04/2019 to 01/10/2020  NGS Results: not done  CURRENT THERAPY: Revlimid 15 mg 2/3 weeks; Xgeva monthly  BRIEF ONCOLOGIC HISTORY:  Oncology History  Multiple myeloma not having achieved remission ([facility])  06/07/2019 Initial Diagnosis   Multiple myeloma not having achieved remission ([place])   07/04/2019 - 01/10/2020 Chemotherapy   The patient had dexamethasone (DECADRON) 4 MG tablet, 1 of 1 cycle, Start date: 01/10/2020, End date: -- lenalidomide (REVLIMID) 15 MG capsule, 1 of 1 cycle, Start date: 02/26/2020, End date: -- bortezomib SQ (VELCADE) chemo injection 2.25 mg, 1.3 mg/m2 = 2.25 mg, Subcutaneous,  Once, 9 of 10 cycles Administration: 2.25 mg (07/04/2019), 2.25 mg (07/11/2019), 2.25 mg (07/26/2019), 2.25 mg (08/02/2019), 2.25 mg (08/09/2019), 2.25 mg (08/16/2019), 2.25 mg (08/23/2019), 2.25 mg (08/30/2019), 2.25 mg (09/06/2019), 2.25 mg (09/13/2019), 2.25 mg (09/20/2019), 2.25 mg (09/27/2019), 2.25 mg (10/04/2019), 2.25 mg (10/11/2019), 2.25 mg (10/18/2019), 2.25 mg (10/25/2019), 2.25 mg (11/01/2019), 2.25 mg (11/08/2019), 2.25 mg (11/15/2019), 2.25 mg (11/22/2019), 2.25 mg (11/29/2019), 2.25 mg (12/06/2019), 2.25 mg (12/13/2019), 2.25 mg (12/20/2019), 2.25 mg (12/27/2019), 2.25 mg (01/03/2020), 2.25 mg (01/10/2020)   for chemotherapy treatment.       CANCER STAGING: Cancer Staging No matching staging information was found for the patient.  INTERVAL HISTORY:  [name], a 80 y.o. female, returns for routine follow-up of her  IgA kappa plasma cell myeloma. Fotini was last seen on 07/23/20.   Today she reports feeling good. She denies any jaw pain, new pains, fevers, bleeding issues, n/v/d, or recent  infections. She reports that she is sleeping well and has a good appetite. She is taking calcium, and she is taking Revlimid and is tolerating it well.   REVIEW OF SYSTEMS:  Review of Systems  Constitutional:  Negative for appetite change, fatigue and fever.  HENT:   Negative for nosebleeds.   Gastrointestinal:  Negative for blood in stool, diarrhea, nausea and vomiting.  Genitourinary:  Negative for hematuria.   Musculoskeletal:  Negative for arthralgias.  Hematological:  Does not bruise/bleed easily.  Psychiatric/Behavioral:  Negative for sleep disturbance.   All other systems reviewed and are negative.  PAST MEDICAL/SURGICAL HISTORY:  Past Medical History:  Diagnosis Date   Atrial fibrillation ([place]) 2011   Postop, spontaneous conversion to normal sinus after one hour   Compression fracture 07/24/09   T12; kyphoplasty   History of echocardiogram 5/11   EF 65%   Hypertension    Thyroid disease    Tobacco abuse    Past Surgical History:  Procedure Laterality Date   BACK SURGERY     BACK SURGERY  06/06/2015   BREAST EXCISIONAL BIOPSY Left    50 years ago  benign   CHOLECYSTECTOMY N/A 04/25/2015   Procedure: LAPAROSCOPIC CHOLECYSTECTOMY WITH INTRAOPERATIVE CHOLANGIOGRAM;  Surgeon: Mickeal Skinner, MD;  [facility];  Service: General;  Laterality: N/A;   COLONOSCOPY N/A 11/26/2015   Procedure: COLONOSCOPY;  Surgeon: Daneil Dolin, MD;  [facility];  Service: Endoscopy;  Laterality: N/A;  7:30 am   ERCP N/A 04/14/2015   Procedure: ENDOSCOPIC RETROGRADE CHOLANGIOPANCREATOGRAPHY (ERCP) Biliary Sphincterotomy, 10x7 stent placement Dilated bilary system just not well seen;  Surgeon: Mechele Dawley  Laural Golden, MD;  [facility];  Service: Endoscopy;  Laterality: N/A;   ERCP N/A 06/12/2015   Procedure: ENDOSCOPIC RETROGRADE CHOLANGIOPANCREATOGRAPHY (ERCP);  Surgeon: Rogene Houston, MD;  [facility];  Service: Endoscopy;  Laterality: N/A;    ESOPHAGOGASTRODUODENOSCOPY N/A 06/12/2015   Procedure: DIAGNOSTIC ESOPHAGOGASTRODUODENOSCOPY (EGD);  Surgeon: Rogene Houston, MD;  [facility];  Service: Endoscopy;  Laterality: N/A;   FEMUR IM NAIL Right 05/11/2019   Procedure: RETROGRADE INTRAMEDULLARY NAIL FEMORAL;  Surgeon: Shona Needles, MD;  Location: [facility];  Service: Orthopedics;  Laterality: Right;   STENT REMOVAL  06/12/2015   Procedure: STENT REMOVAL ;  Surgeon: Rogene Houston, MD;  [facility];  Service: Endoscopy;;    SOCIAL HISTORY:  Social History   Socioeconomic History   Marital status: Widowed    Spouse name: Not on file   Number of children: 2   Years of education: Not on file   Highest education level: Not on file  Occupational History   Occupation: retired  Tobacco Use   Smoking status: Former    Packs/day: 0.15    Years: 20.00    Pack years: 3.00    Types: Cigarettes    Quit date: 03/08/2013    Years since quitting: 7.6   Smokeless tobacco: Never  Vaping Use   Vaping Use: Never used  Substance and Sexual Activity   Alcohol use: No    Alcohol/week: 0.0 standard drinks   Drug use: No   Sexual activity: Not Currently  Other Topics Concern   Not on file  Social History Narrative   Active in gardens and does yard work.   Social Determinants of Health   Financial Resource Strain: Not on file  Food Insecurity: No Food Insecurity   Worried About Charity fundraiser in the Last Year: Never true   Ran Out of Food in the Last Year: Never true  Transportation Needs: Not on file  Physical Activity: Not on file  Stress: Not on file  Social Connections: Moderately Isolated   Frequency of Communication with Friends and Family: More than three times a week   Frequency of Social Gatherings with Friends and Family: More than three times a week   Attends Religious Services: More than 4 times per year   Active Member of [employer] or Organizations: No   Attends Archivist Meetings: Never    Marital Status: Widowed  Human resources officer Violence: Not on file    FAMILY HISTORY:  Family History  Problem Relation Age of Onset   Heart attack Father    Stroke Father    Breast cancer Sister    Thyroid disease Neg Hx    Colon cancer Neg Hx     CURRENT MEDICATIONS:  Current Outpatient Medications  Medication Sig Dispense Refill   acyclovir (ZOVIRAX) 400 MG tablet TAKE (1) TABLET BY MOUTH TWICE DAILY. 60 tablet 6   aspirin EC 81 MG tablet Take 81 mg by mouth daily.     bortezomib IV (VELCADE) 3.5 MG injection Inject 1.3 mg/m2 into the vein once a week. Day 1, 8, 15 every 21 days     carboxymethylcellulose (REFRESH PLUS) 0.5 % SOLN Place 1 drop into both eyes in the morning, at noon, in the evening, and at bedtime.     Cholecalciferol (VITAMIN D) 125 MCG (5000 UT) CAPS Take 5,000 Units by mouth daily. 90 capsule 1   cyclobenzaprine (FLEXERIL) 5 MG tablet Take 1 tablet (5  mg total) by mouth 3 (three) times daily as needed for muscle spasms. (Patient not taking: Reported on 09/17/2020) 30 tablet 0   dexamethasone (DECADRON) 4 MG tablet Take 2 tablets once a week on Thursdays 30 tablet 3   lenalidomide (REVLIMID) 15 MG capsule Take one capsule daily on days 1-14 every 21 days. 14 capsule 0   levothyroxine (SYNTHROID) 112 MCG tablet Take 1 tablet (112 mcg total) by mouth daily. 90 tablet 3   moxifloxacin (VIGAMOX) 0.5 % ophthalmic solution Place 1 drop into the right eye 4 (four) times daily.     NON FORMULARY Diet -Regular     prednisoLONE acetate (PRED FORTE) 1 % ophthalmic suspension Place 1 drop into the right eye 4 (four) times daily.     prochlorperazine (COMPAZINE) 10 MG tablet Take 1 tablet (10 mg total) by mouth every 6 (six) hours as needed (Nausea or vomiting). (Patient not taking: Reported on 09/17/2020) 30 tablet 1   No current facility-administered medications for this visit.    ALLERGIES:  No Known Allergies  PHYSICAL EXAM:  Performance status (ECOG): 1 - Symptomatic  but completely ambulatory  There were no vitals filed for this visit. Wt Readings from Last 3 Encounters:  09/17/20 140 lb 3.2 oz (63.6 kg)  07/23/20 139 lb 14.4 oz (63.5 kg)  05/28/20 134 lb 3.2 oz (60.9 kg)   Physical Exam Vitals reviewed.  Constitutional:      Appearance: Normal appearance.  Cardiovascular:     Rate and Rhythm: Normal rate and regular rhythm.     Pulses: Normal pulses.     Heart sounds: Normal heart sounds.  Pulmonary:     Effort: Pulmonary effort is normal.     Breath sounds: Normal breath sounds.  Neurological:     General: No focal deficit present.     Mental Status: She is alert and oriented to person, place, and time.  Psychiatric:        Mood and Affect: Mood normal.        Behavior: Behavior normal.     LABORATORY DATA:  I have reviewed the labs as listed.  CBC Latest Ref Rng & Units 09/17/2020 07/17/2020 06/25/2020  WBC 4.0 - 10.5 K/uL 3.4(L) 2.4(L) 3.3(L)  Hemoglobin 12.0 - 15.0 g/dL 10.2(L) 10.2(L) 10.1(L)  Hematocrit 36.0 - 46.0 % 32.4(L) 33.1(L) 32.5(L)  Platelets 150 - 400 K/uL 220 185 213   CMP Latest Ref Rng & Units 09/17/2020 08/20/2020 07/17/2020  Glucose 70 - 99 mg/dL 112(H) 92 128(H)  BUN 8 - 23 mg/dL 19 23 13   Creatinine 0.44 - 1.00 mg/dL 0.85 0.87 0.72  Sodium 135 - 145 mmol/L 136 136 138  Potassium 3.5 - 5.1 mmol/L 3.7 3.6 3.7  Chloride 98 - 111 mmol/L 103 101 104  CO2 22 - 32 mmol/L 28 28 27   Calcium 8.9 - 10.3 mg/dL 8.3(L) 8.4(L) 8.6(L)  Total Protein 6.5 - 8.1 g/dL 6.5 6.4(L) 6.8  Total Bilirubin 0.3 - 1.2 mg/dL 0.5 0.4 0.5  Alkaline Phos 38 - 126 U/L 58 66 66  AST 15 - 41 U/L 12(L) 17 14(L)  ALT 0 - 44 U/L 13 20 14     DIAGNOSTIC IMAGING:  I have independently reviewed the scans and discussed with the patient. No results found.   ASSESSMENT:  1.  IgA kappa plasma cell myeloma: -IgA kappa plasma cell myeloma diagnosed on right sacral bone biopsy on 05/17/2019.  SPEP did not show M spike.  Immunofixation shows IgA kappa.  Kappa light chains 42.8, lambda light chains at 9.9, ratio 4.32.  Beta-2 microglobulin 3.7. -PET scan on 06/04/2019 showed expansile lytic lesion involving the right sacral wing.  Lytic lesion involving T10 vertebral body.  Left seventh rib lesion.  Increased uptake in a pathological fracture involving distal aspect of the right femur. 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 -RVD started on 06/26/2019.  Revlimid 15 mg 2 weeks on/1 week off. -Velcade discontinued on 01/10/2020. -She is taking Revlimid 15 mg 2 weeks on 1 week off, with dexamethasone 20 mg on weeks of Revlimid and 10 mg while she is off Revlimid.   PLAN:  1.  IgA kappa plasma cell myeloma: - We reviewed myeloma panel from 09/17/2020.  M spike is undetectable.  Free light chain ratio is 1.83 with kappa light chains 18.3.  Immunofixation shows IgA kappa. - We reviewed her labs today which showed normal creatinine and calcium.  LFTs were normal.  Mild leukopenia from Revlimid. - Continue Revlimid 15 mg 2 weeks on/1 week off. - Dexamethasone was dose reduced to 8 mg once weekly. - RTC 8 weeks with repeat myeloma panel.   2.  Back pain: -She does not have any back pain at this time.   3.  Normocytic anemia: - Hemoglobin is 9.8 today.  Last ferritin was 42. - We will start her on iron tablet daily.   4.  ID prophylaxis: - Continue aspirin for thromboprophylaxis.  Continue acyclovir twice daily.   5.  Myeloma bone disease: - Calcium level today is 10.2.  She is taking 2 calcium Gummies daily. - We will restart her back on denosumab today.  She will continue calcium supplements.  She will also follow-up with her dentist.   Orders placed this encounter:  No orders of the defined types were placed in this encounter.    Derek Jack, MD [place] [phone]   I, Thana Ates, am acting as a scribe for Dr. Derek Jack.  I,  Derek Jack MD, have reviewed the above documentation for accuracy and completeness, and I agree with the above.

## 2020-10-20 ENCOUNTER — Inpatient Hospital Stay (HOSPITAL_COMMUNITY): Payer: Medicare Other | Attending: Hematology

## 2020-10-20 ENCOUNTER — Inpatient Hospital Stay (HOSPITAL_COMMUNITY): Payer: Medicare Other

## 2020-10-20 ENCOUNTER — Other Ambulatory Visit: Payer: Self-pay

## 2020-10-20 ENCOUNTER — Inpatient Hospital Stay (HOSPITAL_BASED_OUTPATIENT_CLINIC_OR_DEPARTMENT_OTHER): Payer: Medicare Other | Admitting: Hematology

## 2020-10-20 VITALS — BP 140/58 | HR 70 | Temp 98.1°F | Resp 16 | Wt 141.8 lb

## 2020-10-20 DIAGNOSIS — C9000 Multiple myeloma not having achieved remission: Secondary | ICD-10-CM

## 2020-10-20 DIAGNOSIS — C9001 Multiple myeloma in remission: Secondary | ICD-10-CM | POA: Diagnosis not present

## 2020-10-20 DIAGNOSIS — Z7982 Long term (current) use of aspirin: Secondary | ICD-10-CM | POA: Insufficient documentation

## 2020-10-20 DIAGNOSIS — Z7952 Long term (current) use of systemic steroids: Secondary | ICD-10-CM | POA: Diagnosis not present

## 2020-10-20 DIAGNOSIS — D72819 Decreased white blood cell count, unspecified: Secondary | ICD-10-CM | POA: Diagnosis not present

## 2020-10-20 LAB — CBC WITH DIFFERENTIAL/PLATELET
Abs Immature Granulocytes: 0.02 10*3/uL (ref 0.00–0.07)
Basophils Absolute: 0 10*3/uL (ref 0.0–0.1)
Basophils Relative: 0 %
Eosinophils Absolute: 0.1 10*3/uL (ref 0.0–0.5)
Eosinophils Relative: 3 %
HCT: 31.3 % — ABNORMAL LOW (ref 36.0–46.0)
Hemoglobin: 9.8 g/dL — ABNORMAL LOW (ref 12.0–15.0)
Immature Granulocytes: 1 %
Lymphocytes Relative: 32 %
Lymphs Abs: 1.1 10*3/uL (ref 0.7–4.0)
MCH: 30.1 pg (ref 26.0–34.0)
MCHC: 31.3 g/dL (ref 30.0–36.0)
MCV: 96 fL (ref 80.0–100.0)
Monocytes Absolute: 0.3 10*3/uL (ref 0.1–1.0)
Monocytes Relative: 8 %
Neutro Abs: 1.9 10*3/uL (ref 1.7–7.7)
Neutrophils Relative %: 56 %
Platelets: 160 10*3/uL (ref 150–400)
RBC: 3.26 MIL/uL — ABNORMAL LOW (ref 3.87–5.11)
RDW: 17 % — ABNORMAL HIGH (ref 11.5–15.5)
WBC: 3.4 10*3/uL — ABNORMAL LOW (ref 4.0–10.5)
nRBC: 0 % (ref 0.0–0.2)

## 2020-10-20 LAB — COMPREHENSIVE METABOLIC PANEL
ALT: 15 U/L (ref 0–44)
AST: 15 U/L (ref 15–41)
Albumin: 3.7 g/dL (ref 3.5–5.0)
Alkaline Phosphatase: 75 U/L (ref 38–126)
Anion gap: 8 (ref 5–15)
BUN: 16 mg/dL (ref 8–23)
CO2: 32 mmol/L (ref 22–32)
Calcium: 10.2 mg/dL (ref 8.9–10.3)
Chloride: 97 mmol/L — ABNORMAL LOW (ref 98–111)
Creatinine, Ser: 0.92 mg/dL (ref 0.44–1.00)
GFR, Estimated: >60 mL/min (ref >60)
Glucose, Bld: 104 mg/dL — ABNORMAL HIGH (ref 70–99)
Potassium: 3.3 mmol/L — ABNORMAL LOW (ref 3.5–5.1)
Sodium: 137 mmol/L (ref 135–145)
Total Bilirubin: 0.6 mg/dL (ref 0.3–1.2)
Total Protein: 6.5 g/dL (ref 6.5–8.1)

## 2020-10-20 LAB — LACTATE DEHYDROGENASE: LDH: 96 U/L — ABNORMAL LOW (ref 98–192)

## 2020-10-20 MED ORDER — DENOSUMAB 120 MG/1.7ML ~~LOC~~ SOLN
120.0000 mg | Freq: Once | SUBCUTANEOUS | Status: AC
  Administered 2020-10-20: 120 mg via SUBCUTANEOUS
  Filled 2020-10-20: qty 1.7

## 2020-10-20 NOTE — Patient Instructions (Addendum)
[facility] at [facility] Discharge Instructions  You were seen today by Dr. Katragadda. He went over your recent results, and you received your injection. Dr. Katragadda will see you back in 2 months for labs and follow up.   Thank you for choosing [facility] at [facility] to provide your oncology and hematology care.  To afford each patient quality time with our provider, please arrive at least 15 minutes before your scheduled appointment time.   If you have a lab appointment with the Cancer Center please come in thru the Main Entrance and check in at the main information desk  You need to re-schedule your appointment should you arrive 10 or more minutes late.  We strive to give you quality time with our providers, and arriving late affects you and other patients whose appointments are after yours.  Also, if you no show three or more times for appointments you may be dismissed from the clinic at the providers discretion.     Again, thank you for choosing [facility].  Our hope is that these requests will decrease the amount of time that you wait before being seen by our physicians.       _____________________________________________________________  Should you have questions after your visit to [facility], please contact our office at [phone] between the hours of 8:00 a.m. and 4:30 p.m.  Voicemails left after 4:00 p.m. will not be returned until the following business day.  For prescription refill requests, have your pharmacy contact our office and allow 72 hours.    Cancer Center Support Programs:   > Cancer Support Group  2nd Tuesday of the month 1pm-2pm, Journey Room   

## 2020-10-20 NOTE — Progress Notes (Signed)
Patient taking calcium as directed.  Denied tooth, jaw, and leg pain.  No recent or upcoming dental visits.  Labs reviewed.  Patient tolerated injection with no complaints voiced.  See MAR for details.  Patient stable during and after injection.  Site clean and dry with no bruising or swelling noted.  Band aid applied.  Vss with discharge and left in satisfactory condition with no s/s of distress noted.   

## 2020-10-20 NOTE — Patient Instructions (Signed)
[facility]  Discharge Instructions: Thank you for choosing [facility] to provide your oncology and hematology care.  If you have a lab appointment with the Cancer Center, please come in thru the Main Entrance and check in at the main information desk.  Wear comfortable clothing and clothing appropriate for easy access to any Portacath or PICC line.   We strive to give you quality time with your provider. You may need to reschedule your appointment if you arrive late (15 or more minutes).  Arriving late affects you and other patients whose appointments are after yours.  Also, if you miss three or more appointments without notifying the office, you may be dismissed from the clinic at the provider's discretion.      For prescription refill requests, have your pharmacy contact our office and allow 72 hours for refills to be completed.    Today you received the following chemotherapy and/or immunotherapy agents xgeva.       To help prevent nausea and vomiting after your treatment, we encourage you to take your nausea medication as directed.  BELOW ARE SYMPTOMS THAT SHOULD BE REPORTED IMMEDIATELY: *FEVER GREATER THAN 100.4 F (38 C) OR HIGHER *CHILLS OR SWEATING *NAUSEA AND VOMITING THAT IS NOT CONTROLLED WITH YOUR NAUSEA MEDICATION *UNUSUAL SHORTNESS OF BREATH *UNUSUAL BRUISING OR BLEEDING *URINARY PROBLEMS (pain or burning when urinating, or frequent urination) *BOWEL PROBLEMS (unusual diarrhea, constipation, pain near the anus) TENDERNESS IN MOUTH AND THROAT WITH OR WITHOUT PRESENCE OF ULCERS (sore throat, sores in mouth, or a toothache) UNUSUAL RASH, SWELLING OR PAIN  UNUSUAL VAGINAL DISCHARGE OR ITCHING   Items with * indicate a potential emergency and should be followed up as soon as possible or go to the Emergency Department if any problems should occur.  Please show the CHEMOTHERAPY ALERT CARD or IMMUNOTHERAPY ALERT CARD at check-in to the Emergency  Department and triage nurse.  Should you have questions after your visit or need to cancel or reschedule your appointment, please contact [facility] [phone]  and follow the prompts.  Office hours are 8:00 a.m. to 4:30 p.m. Monday - Friday. Please note that voicemails left after 4:00 p.m. may not be returned until the following business day.  We are closed weekends and major holidays. You have access to a nurse at all times for urgent questions. Please call the main number to the clinic [phone] and follow the prompts.  For any non-urgent questions, you may also contact your provider using MyChart. We now offer e-Visits for anyone 18 and older to request care online for non-urgent symptoms. For details visit [email or web address].   Also download the MyChart app! Go to the app store, search "MyChart", open the app, select [facility], and log in with your MyChart username and password.  Due to Covid, a mask is required upon entering the hospital/clinic. If you do not have a mask, one will be given to you upon arrival. For doctor visits, patients may have 1 support person aged 18 or older with them. For treatment visits, patients cannot have anyone with them due to current Covid guidelines and our immunocompromised population.  

## 2020-10-21 ENCOUNTER — Telehealth (HOSPITAL_COMMUNITY): Payer: Self-pay | Admitting: *Deleted

## 2020-10-21 LAB — KAPPA/LAMBDA LIGHT CHAINS
Kappa free light chain: 24.8 mg/L — ABNORMAL HIGH (ref 3.3–19.4)
Kappa, lambda light chain ratio: 1.72 — ABNORMAL HIGH (ref 0.26–1.65)
Lambda free light chains: 14.4 mg/L (ref 5.7–26.3)

## 2020-10-21 NOTE — Telephone Encounter (Signed)
Advised daughter of recommendations regarding iron.  Verbalized understanding.

## 2020-10-21 NOTE — Telephone Encounter (Signed)
-----   Message from Derek Jack, MD sent at 10/20/2020  4:57 PM EDT ----- Please call her and let her know to start iron tablet once daily.  She may also need a stool softener if she develops any constipation.

## 2020-10-22 LAB — PROTEIN ELECTROPHORESIS, SERUM
A/G Ratio: 1.4 (ref 0.7–1.7)
Albumin ELP: 3.3 g/dL (ref 2.9–4.4)
Alpha-1-Globulin: 0.2 g/dL (ref 0.0–0.4)
Alpha-2-Globulin: 0.8 g/dL (ref 0.4–1.0)
Beta Globulin: 1.1 g/dL (ref 0.7–1.3)
Gamma Globulin: 0.3 g/dL — ABNORMAL LOW (ref 0.4–1.8)
Globulin, Total: 2.4 g/dL (ref 2.2–3.9)
Total Protein ELP: 5.7 g/dL — ABNORMAL LOW (ref 6.0–8.5)

## 2020-10-22 LAB — IMMUNOFIXATION ELECTROPHORESIS
IgA: 147 mg/dL (ref 64–422)
IgG (Immunoglobin G), Serum: 380 mg/dL — ABNORMAL LOW (ref 586–1602)
IgM (Immunoglobulin M), Srm: 21 mg/dL — ABNORMAL LOW (ref 26–217)
Total Protein ELP: 5.8 g/dL — ABNORMAL LOW (ref 6.0–8.5)

## 2020-11-06 ENCOUNTER — Other Ambulatory Visit (HOSPITAL_COMMUNITY): Payer: Self-pay

## 2020-11-06 DIAGNOSIS — C9000 Multiple myeloma not having achieved remission: Secondary | ICD-10-CM

## 2020-11-06 MED ORDER — LENALIDOMIDE 15 MG PO CAPS: ORAL_CAPSULE | ORAL | 0 refills | Status: DC

## 2020-11-06 NOTE — Telephone Encounter (Signed)
Chart reviewed. Revlimid refilled per last office visit with Dr. Delton Coombes

## 2020-11-11 ENCOUNTER — Other Ambulatory Visit (HOSPITAL_COMMUNITY): Payer: Self-pay | Admitting: Hematology

## 2020-11-11 DIAGNOSIS — C9000 Multiple myeloma not having achieved remission: Secondary | ICD-10-CM

## 2020-11-12 DIAGNOSIS — H25812 Combined forms of age-related cataract, left eye: Secondary | ICD-10-CM | POA: Diagnosis not present

## 2020-11-12 DIAGNOSIS — H2512 Age-related nuclear cataract, left eye: Secondary | ICD-10-CM | POA: Diagnosis not present

## 2020-11-17 ENCOUNTER — Inpatient Hospital Stay (HOSPITAL_COMMUNITY): Payer: Medicare Other

## 2020-11-17 ENCOUNTER — Encounter (HOSPITAL_COMMUNITY): Payer: Self-pay

## 2020-11-17 ENCOUNTER — Inpatient Hospital Stay (HOSPITAL_COMMUNITY): Payer: Medicare Other | Attending: Hematology

## 2020-11-17 ENCOUNTER — Other Ambulatory Visit: Payer: Self-pay

## 2020-11-17 VITALS — BP 118/55 | HR 74 | Temp 98.3°F | Resp 18 | Wt 140.4 lb

## 2020-11-17 DIAGNOSIS — C9000 Multiple myeloma not having achieved remission: Secondary | ICD-10-CM

## 2020-11-17 LAB — COMPREHENSIVE METABOLIC PANEL
ALT: 12 U/L (ref 0–44)
AST: 12 U/L — ABNORMAL LOW (ref 15–41)
Albumin: 3.7 g/dL (ref 3.5–5.0)
Alkaline Phosphatase: 80 U/L (ref 38–126)
Anion gap: 7 (ref 5–15)
BUN: 19 mg/dL (ref 8–23)
CO2: 30 mmol/L (ref 22–32)
Calcium: 8.9 mg/dL (ref 8.9–10.3)
Chloride: 100 mmol/L (ref 98–111)
Creatinine, Ser: 0.91 mg/dL (ref 0.44–1.00)
GFR, Estimated: >60 mL/min (ref >60)
Glucose, Bld: 109 mg/dL — ABNORMAL HIGH (ref 70–99)
Potassium: 3.6 mmol/L (ref 3.5–5.1)
Sodium: 137 mmol/L (ref 135–145)
Total Bilirubin: 0.5 mg/dL (ref 0.3–1.2)
Total Protein: 6.4 g/dL — ABNORMAL LOW (ref 6.5–8.1)

## 2020-11-17 MED ORDER — DENOSUMAB 120 MG/1.7ML ~~LOC~~ SOLN
120.0000 mg | Freq: Once | SUBCUTANEOUS | Status: AC
  Administered 2020-11-17: 120 mg via SUBCUTANEOUS
  Filled 2020-11-17: qty 1.7

## 2020-11-17 NOTE — Patient Instructions (Signed)
[facility]  Discharge Instructions: Thank you for choosing [facility] to provide your oncology and hematology care.  If you have a lab appointment with the [facility], please come in thru the Main Entrance and check in at the main information desk.  Wear comfortable clothing and clothing appropriate for easy access to any Portacath or PICC line.   We strive to give you quality time with your provider. You may need to reschedule your appointment if you arrive late (15 or more minutes).  Arriving late affects you and other patients whose appointments are after yours.  Also, if you miss three or more appointments without notifying the office, you may be dismissed from the clinic at the provider's discretion.      For prescription refill requests, have your pharmacy contact our office and allow 72 hours for refills to be completed.    Today you received the following chemotherapy and/or immunotherapy agents x-geva      To help prevent nausea and vomiting after your treatment, we encourage you to take your nausea medication as directed.  BELOW ARE SYMPTOMS THAT SHOULD BE REPORTED IMMEDIATELY: *FEVER GREATER THAN 100.4 F (38 C) OR HIGHER *CHILLS OR SWEATING *NAUSEA AND VOMITING THAT IS NOT CONTROLLED WITH YOUR NAUSEA MEDICATION *UNUSUAL SHORTNESS OF BREATH *UNUSUAL BRUISING OR BLEEDING *URINARY PROBLEMS (pain or burning when urinating, or frequent urination) *BOWEL PROBLEMS (unusual diarrhea, constipation, pain near the anus) TENDERNESS IN MOUTH AND THROAT WITH OR WITHOUT PRESENCE OF ULCERS (sore throat, sores in mouth, or a toothache) UNUSUAL RASH, SWELLING OR PAIN  UNUSUAL VAGINAL DISCHARGE OR ITCHING   Items with * indicate a potential emergency and should be followed up as soon as possible or go to the Emergency Department if any problems should occur.  Please show the CHEMOTHERAPY ALERT CARD or IMMUNOTHERAPY ALERT CARD at check-in to the Emergency  Department and triage nurse.  Should you have questions after your visit or need to cancel or reschedule your appointment, please contact [facility] [phone]  and follow the prompts.  Office hours are 8:00 a.m. to 4:30 p.m. Monday - Friday. Please note that voicemails left after 4:00 p.m. may not be returned until the following business day.  We are closed weekends and major holidays. You have access to a nurse at all times for urgent questions. Please call the main number to the clinic [phone] and follow the prompts.  For any non-urgent questions, you may also contact your provider using MyChart. We now offer e-Visits for anyone 40 and older to request care online for non-urgent symptoms. For details visit [email or web address].   Also download the MyChart app! Go to the app store, search "MyChart", open the app, select [facility], and log in with your MyChart username and password.  Due to Covid, a mask is required upon entering the hospital/clinic. If you do not have a mask, one will be given to you upon arrival. For doctor visits, patients may have 1 support person aged 65 or older with them. For treatment visits, patients cannot have anyone with them due to current Covid guidelines and our immunocompromised population.   Denosumab injection What is this medication? DENOSUMAB (den oh sue mab) slows bone breakdown. Prolia is used to treat osteoporosis in women after menopause and in men, and in people who are taking corticosteroids for 6 months or more. Delton See is used to treat a high calcium level due to cancer and to prevent bone fractures and other  bone problems caused by multiple myeloma or cancer bone metastases. Delton See is also used to treat giant cell tumor of the bone. This medicine may be used for other purposes; ask your health care provider or pharmacist if you have questions. COMMON BRAND NAME(S): Prolia, XGEVA What should I tell my care team before I take this  medication? They need to know if you have any of these conditions: dental disease having surgery or tooth extraction infection kidney disease low levels of calcium or Vitamin D in the blood malnutrition on hemodialysis skin conditions or sensitivity thyroid or parathyroid disease an unusual reaction to denosumab, other medicines, foods, dyes, or preservatives pregnant or trying to get pregnant breast-feeding How should I use this medication? This medicine is for injection under the skin. It is given by a health care professional in a hospital or clinic setting. A special MedGuide will be given to you before each treatment. Be sure to read this information carefully each time. For Prolia, talk to your pediatrician regarding the use of this medicine in children. Special care may be needed. For Delton See, talk to your pediatrician regarding the use of this medicine in children. While this drug may be prescribed for children as young as 13 years for selected conditions, precautions do apply. Overdosage: If you think you have taken too much of this medicine contact a poison control center or emergency room at once. NOTE: This medicine is only for you. Do not share this medicine with others. What if I miss a dose? It is important not to miss your dose. Call your doctor or health care professional if you are unable to keep an appointment. What may interact with this medication? Do not take this medicine with any of the following medications: other medicines containing denosumab This medicine may also interact with the following medications: medicines that lower your chance of fighting infection steroid medicines like prednisone or cortisone This list may not describe all possible interactions. Give your health care provider a list of all the medicines, herbs, non-prescription drugs, or dietary supplements you use. Also tell them if you smoke, drink alcohol, or use illegal drugs. Some items may  interact with your medicine. What should I watch for while using this medication? Visit your doctor or health care professional for regular checks on your progress. Your doctor or health care professional may order blood tests and other tests to see how you are doing. Call your doctor or health care professional for advice if you get a fever, chills or sore throat, or other symptoms of a cold or flu. Do not treat yourself. This drug may decrease your body's ability to fight infection. Try to avoid being around people who are sick. You should make sure you get enough calcium and vitamin D while you are taking this medicine, unless your doctor tells you not to. Discuss the foods you eat and the vitamins you take with your health care professional. See your dentist regularly. Brush and floss your teeth as directed. Before you have any dental work done, tell your dentist you are receiving this medicine. Do not become pregnant while taking this medicine or for 5 months after stopping it. Talk with your doctor or health care professional about your birth control options while taking this medicine. Women should inform their doctor if they wish to become pregnant or think they might be pregnant. There is a potential for serious side effects to an unborn child. Talk to your health care professional or pharmacist for  more information. What side effects may I notice from receiving this medication? Side effects that you should report to your doctor or health care professional as soon as possible: allergic reactions like skin rash, itching or hives, swelling of the face, lips, or tongue bone pain breathing problems dizziness jaw pain, especially after dental work redness, blistering, peeling of the skin signs and symptoms of infection like fever or chills; cough; sore throat; pain or trouble passing urine signs of low calcium like fast heartbeat, muscle cramps or muscle pain; pain, tingling, numbness in the hands  or feet; seizures unusual bleeding or bruising unusually weak or tired Side effects that usually do not require medical attention (report to your doctor or health care professional if they continue or are bothersome): constipation diarrhea headache joint pain loss of appetite muscle pain runny nose tiredness upset stomach This list may not describe all possible side effects. Call your doctor for medical advice about side effects. You may report side effects to FDA at 1-800-FDA-1088. Where should I keep my medication? This medicine is only given in a clinic, doctor's office, or other health care setting and will not be stored at home. NOTE: This sheet is a summary. It may not cover all possible information. If you have questions about this medicine, talk to your doctor, pharmacist, or health care provider.  2022 Elsevier/Gold Standard (2017-07-01 16:10:44)

## 2020-11-17 NOTE — Progress Notes (Signed)
Courtney Arnold presents today for injection per the provider's orders.  X-geva in right arm administration without incident; injection site WNL; see MAR for injection details.  Patient tolerated procedure well and without incident.  No questions or complaints noted at this time.  Stable during and after injection.  AVS reviewed.  Discharged in stable condition ambulatory.

## 2020-12-03 ENCOUNTER — Other Ambulatory Visit (HOSPITAL_COMMUNITY): Payer: Self-pay

## 2020-12-03 DIAGNOSIS — C9000 Multiple myeloma not having achieved remission: Secondary | ICD-10-CM

## 2020-12-03 MED ORDER — LENALIDOMIDE 15 MG PO CAPS: ORAL_CAPSULE | ORAL | 0 refills | Status: DC

## 2020-12-03 NOTE — Telephone Encounter (Signed)
Chart reviewed. Revlimid refilled per last office note with Dr. Katragadda.  

## 2020-12-09 DIAGNOSIS — L600 Ingrowing nail: Secondary | ICD-10-CM | POA: Diagnosis not present

## 2020-12-09 DIAGNOSIS — L03031 Cellulitis of right toe: Secondary | ICD-10-CM | POA: Diagnosis not present

## 2020-12-09 DIAGNOSIS — M79671 Pain in right foot: Secondary | ICD-10-CM | POA: Diagnosis not present

## 2020-12-09 DIAGNOSIS — M79674 Pain in right toe(s): Secondary | ICD-10-CM | POA: Diagnosis not present

## 2020-12-13 NOTE — Progress Notes (Signed)
[place], [place] [address]   CLINIC:  Medical Oncology/Hematology  PCP:  [name], MD [address] / [place] [address] [phone]   REASON FOR VISIT:  Follow-up for IgA kappa plasma cell myeloma  PRIOR THERAPY: Velcade x 9 cycles from 07/04/2019 to 01/10/2020  NGS Results: not done  CURRENT THERAPY: Revlimid 15 mg 2/3 weeks; Xgeva monthly  BRIEF ONCOLOGIC HISTORY:  Oncology History  Multiple myeloma not having achieved remission ([place])  06/07/2019 Initial Diagnosis   Multiple myeloma not having achieved remission ([place])   07/04/2019 - 01/10/2020 Chemotherapy   The patient had dexamethasone (DECADRON) 4 MG tablet, 1 of 1 cycle, Start date: 01/10/2020, End date: -- lenalidomide (REVLIMID) 15 MG capsule, 1 of 1 cycle, Start date: 02/26/2020, End date: -- bortezomib SQ (VELCADE) chemo injection 2.25 mg, 1.3 mg/m2 = 2.25 mg, Subcutaneous,  Once, 9 of 10 cycles Administration: 2.25 mg (07/04/2019), 2.25 mg (07/11/2019), 2.25 mg (07/26/2019), 2.25 mg (08/02/2019), 2.25 mg (08/09/2019), 2.25 mg (08/16/2019), 2.25 mg (08/23/2019), 2.25 mg (08/30/2019), 2.25 mg (09/06/2019), 2.25 mg (09/13/2019), 2.25 mg (09/20/2019), 2.25 mg (09/27/2019), 2.25 mg (10/04/2019), 2.25 mg (10/11/2019), 2.25 mg (10/18/2019), 2.25 mg (10/25/2019), 2.25 mg (11/01/2019), 2.25 mg (11/08/2019), 2.25 mg (11/15/2019), 2.25 mg (11/22/2019), 2.25 mg (11/29/2019), 2.25 mg (12/06/2019), 2.25 mg (12/13/2019), 2.25 mg (12/20/2019), 2.25 mg (12/27/2019), 2.25 mg (01/03/2020), 2.25 mg (01/10/2020)   for chemotherapy treatment.       CANCER STAGING: Cancer Staging No matching staging information was found for the patient.  INTERVAL HISTORY:  [name], a 80 y.o. female, returns for routine follow-up of her  IgA kappa plasma cell myeloma. Rosielee was last seen on 10/20/2020.   Today she reports feeling good, and she is accompanied by her daughter. She denies fatigue, jaw pain, dental  issues, back pain, black stools, and any current bleeding. She is taking 1 iron tablet daily.    REVIEW OF SYSTEMS:  Review of Systems  Constitutional:  Negative for appetite change and fatigue.  HENT:   Negative for nosebleeds.   Respiratory:  Negative for hemoptysis.   Gastrointestinal:  Negative for blood in stool.  Genitourinary:  Negative for hematuria and vaginal bleeding.   Musculoskeletal:  Negative for arthralgias and back pain.  Hematological:  Does not bruise/bleed easily.  All other systems reviewed and are negative.  PAST MEDICAL/SURGICAL HISTORY:  Past Medical History:  Diagnosis Date   Atrial fibrillation ([place]) 2011   Postop, spontaneous conversion to normal sinus after one hour   Compression fracture 07/24/09   T12; kyphoplasty   History of echocardiogram 5/11   EF 65%   Hypertension    Thyroid disease    Tobacco abuse    Past Surgical History:  Procedure Laterality Date   BACK SURGERY     BACK SURGERY  06/06/2015   BREAST EXCISIONAL BIOPSY Left    50 years ago  benign   CHOLECYSTECTOMY N/A 04/25/2015   Procedure: LAPAROSCOPIC CHOLECYSTECTOMY WITH INTRAOPERATIVE CHOLANGIOGRAM;  Surgeon: Mickeal Skinner, MD;  Location: [facility];  Service: General;  Laterality: N/A;   COLONOSCOPY N/A 11/26/2015   Procedure: COLONOSCOPY;  Surgeon: Daneil Dolin, MD;  Location: [facility];  Service: Endoscopy;  Laterality: N/A;  7:30 am   ERCP N/A 04/14/2015   Procedure: ENDOSCOPIC RETROGRADE CHOLANGIOPANCREATOGRAPHY (ERCP) Biliary Sphincterotomy, 10x7 stent placement Dilated bilary system just not well seen;  Surgeon: Rogene Houston, MD;  Location: [facility];  Service: Endoscopy;  Laterality: N/A;   ERCP N/A 06/12/2015   Procedure: ENDOSCOPIC RETROGRADE CHOLANGIOPANCREATOGRAPHY (ERCP);  Surgeon: Rogene Houston, MD;  [facility];  Service: Endoscopy;  Laterality: N/A;   ESOPHAGOGASTRODUODENOSCOPY N/A 06/12/2015   Procedure: DIAGNOSTIC ESOPHAGOGASTRODUODENOSCOPY (EGD);   Surgeon: Rogene Houston, MD;  [facility];  Service: Endoscopy;  Laterality: N/A;   FEMUR IM NAIL Right 05/11/2019   Procedure: RETROGRADE INTRAMEDULLARY NAIL FEMORAL;  Surgeon: Shona Needles, MD;  Location: [facility];  Service: Orthopedics;  Laterality: Right;   STENT REMOVAL  06/12/2015   Procedure: STENT REMOVAL ;  Surgeon: Rogene Houston, MD;  [facility];  Service: Endoscopy;;    SOCIAL HISTORY:  Social History   Socioeconomic History   Marital status: Widowed    Spouse name: Not on file   Number of children: 2   Years of education: Not on file   Highest education level: Not on file  Occupational History   Occupation: retired  Tobacco Use   Smoking status: Former    Packs/day: 0.15    Years: 20.00    Pack years: 3.00    Types: Cigarettes    Quit date: 03/08/2013    Years since quitting: 7.7   Smokeless tobacco: Never  Vaping Use   Vaping Use: Never used  Substance and Sexual Activity   Alcohol use: No    Alcohol/week: 0.0 standard drinks   Drug use: No   Sexual activity: Not Currently  Other Topics Concern   Not on file  Social History Narrative   Active in gardens and does yard work.   Social Determinants of Health   Financial Resource Strain: Not on file  Food Insecurity: No Food Insecurity   Worried About Charity fundraiser in the Last Year: Never true   Ran Out of Food in the Last Year: Never true  Transportation Needs: Not on file  Physical Activity: Not on file  Stress: Not on file  Social Connections: Moderately Isolated   Frequency of Communication with Friends and Family: More than three times a week   Frequency of Social Gatherings with Friends and Family: More than three times a week   Attends Religious Services: More than 4 times per year   Active Member of [employer] or Organizations: No   Attends Archivist Meetings: Never   Marital Status: Widowed  Human resources officer Violence: Not on file    FAMILY HISTORY:  Family  History  Problem Relation Age of Onset   Heart attack Father    Stroke Father    Breast cancer Sister    Thyroid disease Neg Hx    Colon cancer Neg Hx     CURRENT MEDICATIONS:  Current Outpatient Medications  Medication Sig Dispense Refill   acyclovir (ZOVIRAX) 400 MG tablet TAKE (1) TABLET BY MOUTH TWICE DAILY. 60 tablet 6   aspirin EC 81 MG tablet Take 81 mg by mouth daily.     carboxymethylcellulose (REFRESH PLUS) 0.5 % SOLN Place 1 drop into both eyes in the morning, at noon, in the evening, and at bedtime.     Cholecalciferol (VITAMIN D) 125 MCG (5000 UT) CAPS Take 5,000 Units by mouth daily. 90 capsule 1   cyclobenzaprine (FLEXERIL) 5 MG tablet Take 1 tablet (5 mg total) by mouth 3 (three) times daily as needed for muscle spasms. 30 tablet 0   dexamethasone (DECADRON) 4 MG tablet TAKE 5 TABLETS BY MOUTH ON DAYS 1,8, AND 15 OF CHEMOTHERAPY. REPEAT  EVERY 21 DAYS. 30 tablet 0   lenalidomide (REVLIMID) 15 MG capsule Take one capsule daily on days 1-14 every 21 days. 14 capsule 0   levothyroxine (SYNTHROID) 112 MCG tablet Take 1 tablet (112 mcg total) by mouth daily. 90 tablet 3   moxifloxacin (VIGAMOX) 0.5 % ophthalmic solution Place 1 drop into the right eye 4 (four) times daily.     NON FORMULARY Diet -Regular     prednisoLONE acetate (PRED FORTE) 1 % ophthalmic suspension Place 1 drop into the right eye 4 (four) times daily.     prochlorperazine (COMPAZINE) 10 MG tablet Take 1 tablet (10 mg total) by mouth every 6 (six) hours as needed (Nausea or vomiting). 30 tablet 1   No current facility-administered medications for this visit.    ALLERGIES:  No Known Allergies  PHYSICAL EXAM:  Performance status (ECOG): 1 - Symptomatic but completely ambulatory  There were no vitals filed for this visit. Wt Readings from Last 3 Encounters:  11/17/20 140 lb 6.4 oz (63.7 kg)  10/20/20 141 lb 12.8 oz (64.3 kg)  09/17/20 140 lb 3.2 oz (63.6 kg)   Physical Exam Vitals reviewed.   Constitutional:      Appearance: Normal appearance.  Cardiovascular:     Rate and Rhythm: Normal rate and regular rhythm.     Pulses: Normal pulses.     Heart sounds: Normal heart sounds.  Pulmonary:     Effort: Pulmonary effort is normal.     Breath sounds: Normal breath sounds.  Abdominal:     Palpations: Abdomen is soft. There is no hepatomegaly, splenomegaly or mass.     Tenderness: There is no abdominal tenderness.  Musculoskeletal:     Right lower leg: No edema.     Left lower leg: No edema.  Neurological:     General: No focal deficit present.     Mental Status: She is alert and oriented to person, place, and time.  Psychiatric:        Mood and Affect: Mood normal.        Behavior: Behavior normal.     LABORATORY DATA:  I have reviewed the labs as listed.  CBC Latest Ref Rng & Units 10/20/2020 09/17/2020 07/17/2020  WBC 4.0 - 10.5 K/uL 3.4(L) 3.4(L) 2.4(L)  Hemoglobin 12.0 - 15.0 g/dL 9.8(L) 10.2(L) 10.2(L)  Hematocrit 36.0 - 46.0 % 31.3(L) 32.4(L) 33.1(L)  Platelets 150 - 400 K/uL 160 220 185   CMP Latest Ref Rng & Units 11/17/2020 10/20/2020 09/17/2020  Glucose 70 - 99 mg/dL 109(H) 104(H) 112(H)  BUN 8 - 23 mg/dL _0 Creatinine 0.44 - 1.00 mg/dL 0.91 0.92 0.85  Sodium 135 - 145 mmol/L 137 137 136  Potassium 3.5 - 5.1 mmol/L 3.6 3.3(L) 3.7  Chloride 98 - 111 mmol/L 100 97(L) 103  CO2 22 - 32 mmol/L 30 32 28  Calcium 8.9 - 10.3 mg/dL 8.9 10.2 8.3(L)  Total Protein 6.5 - 8.1 g/dL 6.4(L) 6.5 6.5  Total Bilirubin 0.3 - 1.2 mg/dL 0.5 0.6 0.5  Alkaline Phos 38 - 126 U/L 80 75 58  AST 15 - 41 U/L 12(L) 15 12(L)  ALT 0 - 44 U/L _1 DIAGNOSTIC IMAGING:  I have independently reviewed the scans and discussed with the patient. No results found.   ASSESSMENT:  1.  IgA kappa plasma cell myeloma: -IgA kappa plasma cell myeloma diagnosed on right sacral bone biopsy on 05/17/2019.  SPEP did not show M spike.  Immunofixation shows IgA kappa.  Kappa light chains  42.8, lambda light chains at 9.9, ratio 4.32.  Beta-2 microglobulin 3.7. -PET scan on 06/04/2019 showed expansile lytic lesion involving the right sacral wing.  Lytic lesion involving T10 vertebral body.  Left seventh rib lesion.  Increased uptake in a pathological fracture involving distal aspect of the right femur. 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 -RVD started on 06/26/2019.  Revlimid 15 mg 2 weeks on/1 week off. -Velcade discontinued on 01/10/2020. -She is taking Revlimid 15 mg 2 weeks on 1 week off, with dexamethasone 20 mg on weeks of Revlimid and 10 mg while she is off Revlimid.   PLAN:  1.  IgA kappa plasma cell myeloma: - We reviewed myeloma panel from 10/20/2020.  M spike is not observed.  Free light chain ratio is 1.72 and stable.  Kappa light chains are 24.8.  Immunofixation shows IgA kappa. - Labs from today shows white count 3.0 and ANC normal.  Platelet count was normal.  Creatinine and calcium were normal. - Continue Revlimid 15 mg 2 weeks on/1 week off along with dexamethasone 8 mg weekly. - Myeloma panel from today is pending. - RTC 8 weeks for follow-up.   2.  Back pain: - She does not have any new onset pains.  Back pain completely improved.   3.  Normocytic anemia: - She is taking iron tablet daily.  Her hemoglobin dropped to 8.5 today.  MCV is 97.  Ferritin decreased to 38 from 46 previously in July. - Recommend Feraheme weekly x2.   4.  ID prophylaxis: - Continue aspirin for thromboprophylaxis. - Continue acyclovir twice daily.   5.  Myeloma bone disease: - Continue 2 calcium Gummies daily.  Calcium today is normal.  Continue monthly denosumab.   Orders placed this encounter:  No orders of the defined types were placed in this encounter.    Derek Jack, MD [facility] [phone]   I, Thana Ates, am acting as a scribe for Dr. Derek Jack.  I, Derek Jack MD, have reviewed the above documentation for accuracy and completeness, and I agree with the above.

## 2020-12-15 ENCOUNTER — Inpatient Hospital Stay (HOSPITAL_COMMUNITY): Payer: Medicare Other

## 2020-12-15 ENCOUNTER — Inpatient Hospital Stay (HOSPITAL_COMMUNITY): Payer: Medicare Other | Attending: Hematology

## 2020-12-15 ENCOUNTER — Inpatient Hospital Stay (HOSPITAL_BASED_OUTPATIENT_CLINIC_OR_DEPARTMENT_OTHER): Payer: Medicare Other | Admitting: Hematology

## 2020-12-15 ENCOUNTER — Other Ambulatory Visit: Payer: Self-pay

## 2020-12-15 VITALS — BP 125/77 | HR 80 | Temp 96.9°F | Resp 18 | Wt 141.9 lb

## 2020-12-15 DIAGNOSIS — D649 Anemia, unspecified: Secondary | ICD-10-CM | POA: Diagnosis not present

## 2020-12-15 DIAGNOSIS — C9000 Multiple myeloma not having achieved remission: Secondary | ICD-10-CM | POA: Diagnosis not present

## 2020-12-15 DIAGNOSIS — Z23 Encounter for immunization: Secondary | ICD-10-CM | POA: Diagnosis not present

## 2020-12-15 DIAGNOSIS — C9001 Multiple myeloma in remission: Secondary | ICD-10-CM

## 2020-12-15 DIAGNOSIS — D509 Iron deficiency anemia, unspecified: Secondary | ICD-10-CM | POA: Diagnosis not present

## 2020-12-15 LAB — CBC WITH DIFFERENTIAL/PLATELET
Abs Immature Granulocytes: 0.02 10*3/uL (ref 0.00–0.07)
Basophils Absolute: 0 10*3/uL (ref 0.0–0.1)
Basophils Relative: 0 %
Eosinophils Absolute: 0.1 10*3/uL (ref 0.0–0.5)
Eosinophils Relative: 4 %
HCT: 26.5 % — ABNORMAL LOW (ref 36.0–46.0)
Hemoglobin: 8.5 g/dL — ABNORMAL LOW (ref 12.0–15.0)
Immature Granulocytes: 1 %
Lymphocytes Relative: 34 %
Lymphs Abs: 1 10*3/uL (ref 0.7–4.0)
MCH: 31.1 pg (ref 26.0–34.0)
MCHC: 32.1 g/dL (ref 30.0–36.0)
MCV: 97.1 fL (ref 80.0–100.0)
Monocytes Absolute: 0.1 10*3/uL (ref 0.1–1.0)
Monocytes Relative: 5 %
Neutro Abs: 1.7 10*3/uL (ref 1.7–7.7)
Neutrophils Relative %: 56 %
Platelets: 196 10*3/uL (ref 150–400)
RBC: 2.73 MIL/uL — ABNORMAL LOW (ref 3.87–5.11)
RDW: 17.8 % — ABNORMAL HIGH (ref 11.5–15.5)
WBC: 3 10*3/uL — ABNORMAL LOW (ref 4.0–10.5)
nRBC: 0 % (ref 0.0–0.2)

## 2020-12-15 LAB — COMPREHENSIVE METABOLIC PANEL
ALT: 12 U/L (ref 0–44)
AST: 14 U/L — ABNORMAL LOW (ref 15–41)
Albumin: 3.6 g/dL (ref 3.5–5.0)
Alkaline Phosphatase: 76 U/L (ref 38–126)
Anion gap: 9 (ref 5–15)
BUN: 16 mg/dL (ref 8–23)
CO2: 29 mmol/L (ref 22–32)
Calcium: 9 mg/dL (ref 8.9–10.3)
Chloride: 100 mmol/L (ref 98–111)
Creatinine, Ser: 0.9 mg/dL (ref 0.44–1.00)
GFR, Estimated: >60 mL/min (ref >60)
Glucose, Bld: 102 mg/dL — ABNORMAL HIGH (ref 70–99)
Potassium: 3.3 mmol/L — ABNORMAL LOW (ref 3.5–5.1)
Sodium: 138 mmol/L (ref 135–145)
Total Bilirubin: 0.6 mg/dL (ref 0.3–1.2)
Total Protein: 6.2 g/dL — ABNORMAL LOW (ref 6.5–8.1)

## 2020-12-15 LAB — IRON AND TIBC
Iron: 56 ug/dL (ref 28–170)
Saturation Ratios: 13 % (ref 10.4–31.8)
TIBC: 421 ug/dL (ref 250–450)
UIBC: 365 ug/dL

## 2020-12-15 LAB — LACTATE DEHYDROGENASE: LDH: 118 U/L (ref 98–192)

## 2020-12-15 LAB — FERRITIN: Ferritin: 38 ng/mL (ref 11–307)

## 2020-12-15 MED ORDER — INFLUENZA VAC A&B SA ADJ QUAD 0.5 ML IM PRSY
0.5000 mL | PREFILLED_SYRINGE | Freq: Once | INTRAMUSCULAR | Status: AC
  Administered 2020-12-15: 0.5 mL via INTRAMUSCULAR
  Filled 2020-12-15: qty 0.5

## 2020-12-15 MED ORDER — INFLUENZA VAC A&B SA ADJ QUAD 0.5 ML IM PRSY: 0.5000 mL | PREFILLED_SYRINGE | Freq: Once | INTRAMUSCULAR | Status: DC

## 2020-12-15 MED ORDER — DENOSUMAB 120 MG/1.7ML ~~LOC~~ SOLN
120.0000 mg | Freq: Once | SUBCUTANEOUS | Status: AC
  Administered 2020-12-15: 120 mg via SUBCUTANEOUS
  Filled 2020-12-15: qty 1.7

## 2020-12-15 NOTE — Progress Notes (Signed)
Patient has been assessed, vital signs and labs have been reviewed by Dr. Delton Coombes. ANC, Creatinine, LFTs, and Platelets are within treatment parameters per Dr. Delton Coombes. The patient is good to proceed with treatment Xgeva at this time.  Primary RN and pharmacy aware.

## 2020-12-15 NOTE — Progress Notes (Signed)
Courtney Arnold presents today for injection per the provider's orders.  Xgeva administration without incident; injection site WNL; see MAR for injection details.Pt is taking Calcium and Vit D as directed. Pt denies any tooth or jaw pain at this time. No recent or future dental appointments. Patient tolerated procedure well and without incident.  No questions or complaints noted at this time.   Xgeva injection given today per MD orders. Tolerated infusion without adverse affects. Vital signs stable. No complaints at this time. Discharged from clinic ambulatory in stable condition. Alert and oriented x 3. F/U with [facility] as scheduled.

## 2020-12-15 NOTE — Patient Instructions (Signed)
[facility] at [facility] Discharge Instructions  You were seen and examined today by Dr. Katragadda. Please follow up as scheduled.    Thank you for choosing [facility] at [facility] to provide your oncology and hematology care.  To afford each patient quality time with our provider, please arrive at least 15 minutes before your scheduled appointment time.   If you have a lab appointment with the Cancer Center please come in thru the Main Entrance and check in at the main information desk.  You need to re-schedule your appointment should you arrive 10 or more minutes late.  We strive to give you quality time with our providers, and arriving late affects you and other patients whose appointments are after yours.  Also, if you no show three or more times for appointments you may be dismissed from the clinic at the providers discretion.     Again, thank you for choosing [facility].  Our hope is that these requests will decrease the amount of time that you wait before being seen by our physicians.       _____________________________________________________________  Should you have questions after your visit to  Cancer Center, please contact our office at [phone] and follow the prompts.  Our office hours are 8:00 a.m. and 4:30 p.m. Monday - Friday.  Please note that voicemails left after 4:00 p.m. may not be returned until the following business day.  We are closed weekends and major holidays.  You do have access to a nurse 24-7, just call the main number to the clinic [phone] and do not press any options, hold on the line and a nurse will answer the phone.    For prescription refill requests, have your pharmacy contact our office and allow 72 hours.    Due to Covid, you will need to wear a mask upon entering the hospital. If you do not have a mask, a mask will be given to you at the Main Entrance upon arrival. For  doctor visits, patients may have 1 support person age 18 or older with them. For treatment visits, patients can not have anyone with them due to social distancing guidelines and our immunocompromised population.      

## 2020-12-15 NOTE — Progress Notes (Signed)
Patient come in for office visit. Flu vaccine ordered, See MAR for administration information. Patient remained stable during injection. Patient was discharged from clinic with daughter ambulatory and in stable condition.

## 2020-12-15 NOTE — Patient Instructions (Signed)
Courtney Arnold  Discharge Instructions: Thank you for choosing [facility] to provide your oncology and hematology care.  If you have a lab appointment with the [facility], please come in thru the Main Entrance and check in at the main information desk.  Wear comfortable clothing and clothing appropriate for easy access to any Portacath or PICC line.   We strive to give you quality time with your provider. You may need to reschedule your appointment if you arrive late (15 or more minutes).  Arriving late affects you and other patients whose appointments are after yours.  Also, if you miss three or more appointments without notifying the office, you may be dismissed from the clinic at the provider's discretion.      For prescription refill requests, have your pharmacy contact our office and allow 72 hours for refills to be completed.    Today you received Xgeva.   BELOW ARE SYMPTOMS THAT SHOULD BE REPORTED IMMEDIATELY: *FEVER GREATER THAN 100.4 F (38 C) OR HIGHER *CHILLS OR SWEATING *NAUSEA AND VOMITING THAT IS NOT CONTROLLED WITH YOUR NAUSEA MEDICATION *UNUSUAL SHORTNESS OF BREATH *UNUSUAL BRUISING OR BLEEDING *URINARY PROBLEMS (pain or burning when urinating, or frequent urination) *BOWEL PROBLEMS (unusual diarrhea, constipation, pain near the anus) TENDERNESS IN MOUTH AND THROAT WITH OR WITHOUT PRESENCE OF ULCERS (sore throat, sores in mouth, or a toothache) UNUSUAL RASH, SWELLING OR PAIN  UNUSUAL VAGINAL DISCHARGE OR ITCHING   Items with * indicate a potential emergency and should be followed up as soon as possible or go to the Emergency Department if any problems should occur.  Please show the CHEMOTHERAPY ALERT CARD or IMMUNOTHERAPY ALERT CARD at check-in to the Emergency Department and triage nurse.  Should you have questions after your visit or need to cancel or reschedule your appointment, please contact [facility] [phone]  and follow  the prompts.  Office hours are 8:00 a.m. to 4:30 p.m. Monday - Friday. Please note that voicemails left after 4:00 p.m. may not be returned until the following business day.  We are closed weekends and major holidays. You have access to a nurse at all times for urgent questions. Please call the main number to the clinic [phone] and follow the prompts.  For any non-urgent questions, you may also contact your provider using MyChart. We now offer e-Visits for anyone 30 and older to request care online for non-urgent symptoms. For details visit [email or web address].   Also download the MyChart app! Go to the app store, search "MyChart", open the app, select [facility], and log in with your MyChart username and password.  Due to Covid, a mask is required upon entering the hospital/clinic. If you do not have a mask, one will be given to you upon arrival. For doctor visits, patients may have 1 support person aged 62 or older with them. For treatment visits, patients cannot have anyone with them due to current Covid guidelines and our immunocompromised population.

## 2020-12-16 LAB — KAPPA/LAMBDA LIGHT CHAINS
Kappa free light chain: 22.1 mg/L — ABNORMAL HIGH (ref 3.3–19.4)
Kappa, lambda light chain ratio: 2.26 — ABNORMAL HIGH (ref 0.26–1.65)
Lambda free light chains: 9.8 mg/L (ref 5.7–26.3)

## 2020-12-17 LAB — PROTEIN ELECTROPHORESIS, SERUM
A/G Ratio: 1.3 (ref 0.7–1.7)
Albumin ELP: 3.2 g/dL (ref 2.9–4.4)
Alpha-1-Globulin: 0.3 g/dL (ref 0.0–0.4)
Alpha-2-Globulin: 0.8 g/dL (ref 0.4–1.0)
Beta Globulin: 1.1 g/dL (ref 0.7–1.3)
Gamma Globulin: 0.3 g/dL — ABNORMAL LOW (ref 0.4–1.8)
Globulin, Total: 2.5 g/dL (ref 2.2–3.9)
Total Protein ELP: 5.7 g/dL — ABNORMAL LOW (ref 6.0–8.5)

## 2020-12-18 ENCOUNTER — Ambulatory Visit (HOSPITAL_COMMUNITY): Payer: Medicare Other

## 2020-12-18 LAB — IMMUNOFIXATION ELECTROPHORESIS
IgA: 148 mg/dL (ref 64–422)
IgG (Immunoglobin G), Serum: 307 mg/dL — ABNORMAL LOW (ref 586–1602)
IgM (Immunoglobulin M), Srm: 14 mg/dL — ABNORMAL LOW (ref 26–217)
Total Protein ELP: 5.5 g/dL — ABNORMAL LOW (ref 6.0–8.5)

## 2020-12-19 ENCOUNTER — Other Ambulatory Visit (HOSPITAL_COMMUNITY): Payer: Self-pay

## 2020-12-19 DIAGNOSIS — C9000 Multiple myeloma not having achieved remission: Secondary | ICD-10-CM

## 2020-12-19 MED ORDER — LENALIDOMIDE 15 MG PO CAPS: ORAL_CAPSULE | ORAL | 0 refills | Status: DC

## 2020-12-19 NOTE — Telephone Encounter (Signed)
Chart reviewed. Revlimid refilled per last office note with Dr. Katragadda.  

## 2020-12-22 ENCOUNTER — Other Ambulatory Visit: Payer: Self-pay

## 2020-12-22 ENCOUNTER — Inpatient Hospital Stay (HOSPITAL_COMMUNITY): Payer: Medicare Other

## 2020-12-22 ENCOUNTER — Encounter (HOSPITAL_COMMUNITY): Payer: Self-pay

## 2020-12-22 VITALS — BP 119/50 | HR 78 | Temp 96.7°F | Resp 17

## 2020-12-22 DIAGNOSIS — D509 Iron deficiency anemia, unspecified: Secondary | ICD-10-CM | POA: Diagnosis not present

## 2020-12-22 DIAGNOSIS — Z23 Encounter for immunization: Secondary | ICD-10-CM | POA: Diagnosis not present

## 2020-12-22 DIAGNOSIS — C9000 Multiple myeloma not having achieved remission: Secondary | ICD-10-CM | POA: Diagnosis not present

## 2020-12-22 MED ORDER — SODIUM CHLORIDE 0.9 % IV SOLN
510.0000 mg | Freq: Once | INTRAVENOUS | Status: AC
  Administered 2020-12-22: 510 mg via INTRAVENOUS
  Filled 2020-12-22: qty 17

## 2020-12-22 MED ORDER — SODIUM CHLORIDE 0.9 % IV SOLN: Freq: Once | INTRAVENOUS | Status: AC

## 2020-12-22 NOTE — Patient Instructions (Signed)
[facility]  Discharge Instructions: Thank you for choosing [facility] to provide your oncology and hematology care.  If you have a lab appointment with the Cancer Center, please come in thru the Main Entrance and check in at the main information desk.  Wear comfortable clothing and clothing appropriate for easy access to any Portacath or PICC line.   We strive to give you quality time with your provider. You may need to reschedule your appointment if you arrive late (15 or more minutes).  Arriving late affects you and other patients whose appointments are after yours.  Also, if you miss three or more appointments without notifying the office, you may be dismissed from the clinic at the provider's discretion.      For prescription refill requests, have your pharmacy contact our office and allow 72 hours for refills to be completed.    Today you received the following: Feraheme. Return as scheduled.   To help prevent nausea and vomiting after your treatment, we encourage you to take your nausea medication as directed.  BELOW ARE SYMPTOMS THAT SHOULD BE REPORTED IMMEDIATELY: *FEVER GREATER THAN 100.4 F (38 C) OR HIGHER *CHILLS OR SWEATING *NAUSEA AND VOMITING THAT IS NOT CONTROLLED WITH YOUR NAUSEA MEDICATION *UNUSUAL SHORTNESS OF BREATH *UNUSUAL BRUISING OR BLEEDING *URINARY PROBLEMS (pain or burning when urinating, or frequent urination) *BOWEL PROBLEMS (unusual diarrhea, constipation, pain near the anus) TENDERNESS IN MOUTH AND THROAT WITH OR WITHOUT PRESENCE OF ULCERS (sore throat, sores in mouth, or a toothache) UNUSUAL RASH, SWELLING OR PAIN  UNUSUAL VAGINAL DISCHARGE OR ITCHING   Items with * indicate a potential emergency and should be followed up as soon as possible or go to the Emergency Department if any problems should occur.  Please show the CHEMOTHERAPY ALERT CARD or IMMUNOTHERAPY ALERT CARD at check-in to the Emergency Department and triage  nurse.  Should you have questions after your visit or need to cancel or reschedule your appointment, please contact [facility] [phone]  and follow the prompts.  Office hours are 8:00 a.m. to 4:30 p.m. Monday - Friday. Please note that voicemails left after 4:00 p.m. may not be returned until the following business day.  We are closed weekends and major holidays. You have access to a nurse at all times for urgent questions. Please call the main number to the clinic [phone] and follow the prompts.  For any non-urgent questions, you may also contact your provider using MyChart. We now offer e-Visits for anyone 18 and older to request care online for non-urgent symptoms. For details visit [email or web address].   Also download the MyChart app! Go to the app store, search "MyChart", open the app, select [facility], and log in with your MyChart username and password.  Due to Covid, a mask is required upon entering the hospital/clinic. If you do not have a mask, one will be given to you upon arrival. For doctor visits, patients may have 1 support person aged 18 or older with them. For treatment visits, patients cannot have anyone with them due to current Covid guidelines and our immunocompromised population.  

## 2020-12-22 NOTE — Progress Notes (Signed)
Patient tolerated iron infusion with no complaints voiced.  Peripheral IV site clean and dry with good blood return noted before and after infusion.  Band aid applied.  VSS with discharge and left in satisfactory condition with no s/s of distress noted.   

## 2020-12-23 DIAGNOSIS — L03031 Cellulitis of right toe: Secondary | ICD-10-CM | POA: Diagnosis not present

## 2020-12-23 DIAGNOSIS — L600 Ingrowing nail: Secondary | ICD-10-CM | POA: Diagnosis not present

## 2020-12-23 DIAGNOSIS — M79671 Pain in right foot: Secondary | ICD-10-CM | POA: Diagnosis not present

## 2020-12-23 DIAGNOSIS — M79674 Pain in right toe(s): Secondary | ICD-10-CM | POA: Diagnosis not present

## 2020-12-25 ENCOUNTER — Ambulatory Visit (HOSPITAL_COMMUNITY): Payer: Medicare Other

## 2020-12-29 ENCOUNTER — Encounter (HOSPITAL_COMMUNITY): Payer: Self-pay

## 2020-12-29 ENCOUNTER — Other Ambulatory Visit: Payer: Self-pay

## 2020-12-29 ENCOUNTER — Inpatient Hospital Stay (HOSPITAL_COMMUNITY): Payer: Medicare Other

## 2020-12-29 VITALS — BP 119/70 | HR 76 | Temp 98.3°F | Resp 20

## 2020-12-29 DIAGNOSIS — Z23 Encounter for immunization: Secondary | ICD-10-CM | POA: Diagnosis not present

## 2020-12-29 DIAGNOSIS — C9000 Multiple myeloma not having achieved remission: Secondary | ICD-10-CM | POA: Diagnosis not present

## 2020-12-29 DIAGNOSIS — D509 Iron deficiency anemia, unspecified: Secondary | ICD-10-CM | POA: Diagnosis not present

## 2020-12-29 MED ORDER — SODIUM CHLORIDE 0.9 % IV SOLN
510.0000 mg | Freq: Once | INTRAVENOUS | Status: AC
  Administered 2020-12-29: 510 mg via INTRAVENOUS
  Filled 2020-12-29: qty 510

## 2020-12-29 MED ORDER — SODIUM CHLORIDE 0.9 % IV SOLN: Freq: Once | INTRAVENOUS | Status: AC

## 2020-12-29 NOTE — Progress Notes (Signed)
Feraheme given today per MD orders. Tolerated infusion without adverse affects. Vital signs stable. No complaints at this time. Discharged from clinic ambulatory in stable condition.Patient declined to wait the full 30 minute wait time post iron infusion. Patient teaching performed and understanding verbalized. Alert and oriented x 3. F/U with [facility] as scheduled.

## 2020-12-29 NOTE — Patient Instructions (Signed)
[facility]  Discharge Instructions: Thank you for choosing [facility] to provide your oncology and hematology care.  If you have a lab appointment with the [facility], please come in thru the Main Entrance and check in at the main information desk.  Wear comfortable clothing and clothing appropriate for easy access to any Portacath or PICC line.   We strive to give you quality time with your provider. You may need to reschedule your appointment if you arrive late (15 or more minutes).  Arriving late affects you and other patients whose appointments are after yours.  Also, if you miss three or more appointments without notifying the office, you may be dismissed from the clinic at the provider's discretion.      For prescription refill requests, have your pharmacy contact our office and allow 72 hours for refills to be completed.    Today you received the following: Feraheme.       To help prevent nausea and vomiting after your treatment, we encourage you to take your nausea medication as directed.  BELOW ARE SYMPTOMS THAT SHOULD BE REPORTED IMMEDIATELY: *FEVER GREATER THAN 100.4 F (38 C) OR HIGHER *CHILLS OR SWEATING *NAUSEA AND VOMITING THAT IS NOT CONTROLLED WITH YOUR NAUSEA MEDICATION *UNUSUAL SHORTNESS OF BREATH *UNUSUAL BRUISING OR BLEEDING *URINARY PROBLEMS (pain or burning when urinating, or frequent urination) *BOWEL PROBLEMS (unusual diarrhea, constipation, pain near the anus) TENDERNESS IN MOUTH AND THROAT WITH OR WITHOUT PRESENCE OF ULCERS (sore throat, sores in mouth, or a toothache) UNUSUAL RASH, SWELLING OR PAIN  UNUSUAL VAGINAL DISCHARGE OR ITCHING   Items with * indicate a potential emergency and should be followed up as soon as possible or go to the Emergency Department if any problems should occur.  Please show the CHEMOTHERAPY ALERT CARD or IMMUNOTHERAPY ALERT CARD at check-in to the Emergency Department and triage nurse.  Should you  have questions after your visit or need to cancel or reschedule your appointment, please contact [facility] [phone]  and follow the prompts.  Office hours are 8:00 a.m. to 4:30 p.m. Monday - Friday. Please note that voicemails left after 4:00 p.m. may not be returned until the following business day.  We are closed weekends and major holidays. You have access to a nurse at all times for urgent questions. Please call the main number to the clinic [phone] and follow the prompts.  For any non-urgent questions, you may also contact your provider using MyChart. We now offer e-Visits for anyone 92 and older to request care online for non-urgent symptoms. For details visit [email or web address].   Also download the MyChart app! Go to the app store, search "MyChart", open the app, select [facility], and log in with your MyChart username and password.  Due to Covid, a mask is required upon entering the hospital/clinic. If you do not have a mask, one will be given to you upon arrival. For doctor visits, patients may have 1 support person aged 32 or older with them. For treatment visits, patients cannot have anyone with them due to current Covid guidelines and our immunocompromised population.

## 2021-01-02 ENCOUNTER — Other Ambulatory Visit (HOSPITAL_COMMUNITY): Payer: Self-pay | Admitting: Hematology

## 2021-01-02 DIAGNOSIS — C9000 Multiple myeloma not having achieved remission: Secondary | ICD-10-CM

## 2021-01-05 ENCOUNTER — Encounter (HOSPITAL_COMMUNITY): Payer: Self-pay | Admitting: Hematology

## 2021-01-06 ENCOUNTER — Other Ambulatory Visit (HOSPITAL_COMMUNITY): Payer: Self-pay

## 2021-01-06 DIAGNOSIS — C9000 Multiple myeloma not having achieved remission: Secondary | ICD-10-CM

## 2021-01-06 MED ORDER — LENALIDOMIDE 15 MG PO CAPS: ORAL_CAPSULE | ORAL | 0 refills | Status: DC

## 2021-01-06 NOTE — Telephone Encounter (Signed)
Chart reviewed. Revlimid refilled per last office note with Dr. Katragadda.  

## 2021-01-12 ENCOUNTER — Inpatient Hospital Stay (HOSPITAL_COMMUNITY): Payer: Medicare Other

## 2021-01-12 ENCOUNTER — Inpatient Hospital Stay (HOSPITAL_COMMUNITY): Payer: Medicare Other | Attending: Hematology

## 2021-01-12 ENCOUNTER — Other Ambulatory Visit: Payer: Self-pay

## 2021-01-12 VITALS — BP 127/57 | HR 73 | Temp 96.9°F | Resp 18

## 2021-01-12 DIAGNOSIS — C9000 Multiple myeloma not having achieved remission: Secondary | ICD-10-CM | POA: Diagnosis not present

## 2021-01-12 LAB — COMPREHENSIVE METABOLIC PANEL
ALT: 21 U/L (ref 0–44)
AST: 18 U/L (ref 15–41)
Albumin: 3.7 g/dL (ref 3.5–5.0)
Alkaline Phosphatase: 86 U/L (ref 38–126)
Anion gap: 9 (ref 5–15)
BUN: 15 mg/dL (ref 8–23)
CO2: 29 mmol/L (ref 22–32)
Calcium: 9.6 mg/dL (ref 8.9–10.3)
Chloride: 99 mmol/L (ref 98–111)
Creatinine, Ser: 1.08 mg/dL — ABNORMAL HIGH (ref 0.44–1.00)
GFR, Estimated: 52 mL/min — ABNORMAL LOW (ref >60)
Glucose, Bld: 95 mg/dL (ref 70–99)
Potassium: 3.7 mmol/L (ref 3.5–5.1)
Sodium: 137 mmol/L (ref 135–145)
Total Bilirubin: 0.3 mg/dL (ref 0.3–1.2)
Total Protein: 6.3 g/dL — ABNORMAL LOW (ref 6.5–8.1)

## 2021-01-12 MED ORDER — DENOSUMAB 120 MG/1.7ML ~~LOC~~ SOLN
120.0000 mg | Freq: Once | SUBCUTANEOUS | Status: AC
  Administered 2021-01-12: 120 mg via SUBCUTANEOUS
  Filled 2021-01-12: qty 1.7

## 2021-01-12 NOTE — Progress Notes (Signed)
Courtney Arnold presents today for Xgeva injection per the provider's orders. Patient has been taking Calcium and vitamin D supplements, has has no prior or upcoming dental work or jaw pain.  Stable during administration without incident; injection site WNL; see MAR for injection details.  Patient tolerated procedure well and without incident.  No questions or complaints noted at this time.  Discharge from clinic ambulatory in stable condition.  Alert and oriented X 3.  Follow up with [facility] as scheduled.

## 2021-01-12 NOTE — Patient Instructions (Signed)
[facility]  Discharge Instructions: Thank you for choosing [facility] to provide your oncology and hematology care.  If you have a lab appointment with the Cancer Center, please come in thru the Main Entrance and check in at the main information desk.  Wear comfortable clothing and clothing appropriate for easy access to any Portacath or PICC line.   We strive to give you quality time with your provider. You may need to reschedule your appointment if you arrive late (15 or more minutes).  Arriving late affects you and other patients whose appointments are after yours.  Also, if you miss three or more appointments without notifying the office, you may be dismissed from the clinic at the provider's discretion.      For prescription refill requests, have your pharmacy contact our office and allow 72 hours for refills to be completed.    Today you received the following chemotherapy and/or immunotherapy agents Xgeva      To help prevent nausea and vomiting after your treatment, we encourage you to take your nausea medication as directed.  BELOW ARE SYMPTOMS THAT SHOULD BE REPORTED IMMEDIATELY: *FEVER GREATER THAN 100.4 F (38 C) OR HIGHER *CHILLS OR SWEATING *NAUSEA AND VOMITING THAT IS NOT CONTROLLED WITH YOUR NAUSEA MEDICATION *UNUSUAL SHORTNESS OF BREATH *UNUSUAL BRUISING OR BLEEDING *URINARY PROBLEMS (pain or burning when urinating, or frequent urination) *BOWEL PROBLEMS (unusual diarrhea, constipation, pain near the anus) TENDERNESS IN MOUTH AND THROAT WITH OR WITHOUT PRESENCE OF ULCERS (sore throat, sores in mouth, or a toothache) UNUSUAL RASH, SWELLING OR PAIN  UNUSUAL VAGINAL DISCHARGE OR ITCHING   Items with * indicate a potential emergency and should be followed up as soon as possible or go to the Emergency Department if any problems should occur.  Please show the CHEMOTHERAPY ALERT CARD or IMMUNOTHERAPY ALERT CARD at check-in to the Emergency Department  and triage nurse.  Should you have questions after your visit or need to cancel or reschedule your appointment, please contact [facility] [phone]  and follow the prompts.  Office hours are 8:00 a.m. to 4:30 p.m. Monday - Friday. Please note that voicemails left after 4:00 p.m. may not be returned until the following business day.  We are closed weekends and major holidays. You have access to a nurse at all times for urgent questions. Please call the main number to the clinic [phone] and follow the prompts.  For any non-urgent questions, you may also contact your provider using MyChart. We now offer e-Visits for anyone 18 and older to request care online for non-urgent symptoms. For details visit [email or web address].   Also download the MyChart app! Go to the app store, search "MyChart", open the app, select , and log in with your MyChart username and password.  Due to Covid, a mask is required upon entering the hospital/clinic. If you do not have a mask, one will be given to you upon arrival. For doctor visits, patients may have 1 support person aged 18 or older with them. For treatment visits, patients cannot have anyone with them due to current Covid guidelines and our immunocompromised population.  

## 2021-01-26 ENCOUNTER — Other Ambulatory Visit (HOSPITAL_COMMUNITY): Payer: Self-pay

## 2021-01-26 DIAGNOSIS — C9000 Multiple myeloma not having achieved remission: Secondary | ICD-10-CM

## 2021-01-26 MED ORDER — LENALIDOMIDE 15 MG PO CAPS: ORAL_CAPSULE | ORAL | 0 refills | Status: DC

## 2021-01-26 NOTE — Telephone Encounter (Signed)
Chart reviewed. Revlimid refilled per last office note with Dr. Katragadda.  

## 2021-02-04 ENCOUNTER — Other Ambulatory Visit (HOSPITAL_COMMUNITY): Payer: Self-pay | Admitting: Hematology

## 2021-02-04 DIAGNOSIS — C9000 Multiple myeloma not having achieved remission: Secondary | ICD-10-CM

## 2021-02-08 NOTE — Progress Notes (Signed)
Courtney Arnold [address], [place] [address]   CLINIC:  Medical Oncology/Hematology  PCP:  Courtney Found, MD [address] / [place] [address] [phone]   REASON FOR VISIT:  Follow-up for IgA kappa plasma cell myeloma  PRIOR THERAPY: Velcade x 9 cycles from 07/04/2019 to 01/10/2020  NGS Results: not done  CURRENT THERAPY: Revlimid 15 mg 2/3 weeks; Xgeva monthly  BRIEF ONCOLOGIC HISTORY:  Oncology History  Multiple myeloma not having achieved remission (HCC)  06/07/2019 Initial Diagnosis   Multiple myeloma not having achieved remission (HCC)   07/04/2019 - 01/10/2020 Chemotherapy   The patient had dexamethasone (DECADRON) 4 MG tablet, 1 of 1 cycle, Start date: 01/10/2020, End date: -- lenalidomide (REVLIMID) 15 MG capsule, 1 of 1 cycle, Start date: 02/26/2020, End date: -- bortezomib SQ (VELCADE) chemo injection 2.25 mg, 1.3 mg/m2 = 2.25 mg, Subcutaneous,  Once, 9 of 10 cycles Administration: 2.25 mg (07/04/2019), 2.25 mg (07/11/2019), 2.25 mg (07/26/2019), 2.25 mg (08/02/2019), 2.25 mg (08/09/2019), 2.25 mg (08/16/2019), 2.25 mg (08/23/2019), 2.25 mg (08/30/2019), 2.25 mg (09/06/2019), 2.25 mg (09/13/2019), 2.25 mg (09/20/2019), 2.25 mg (09/27/2019), 2.25 mg (10/04/2019), 2.25 mg (10/11/2019), 2.25 mg (10/18/2019), 2.25 mg (10/25/2019), 2.25 mg (11/01/2019), 2.25 mg (11/08/2019), 2.25 mg (11/15/2019), 2.25 mg (11/22/2019), 2.25 mg (11/29/2019), 2.25 mg (12/06/2019), 2.25 mg (12/13/2019), 2.25 mg (12/20/2019), 2.25 mg (12/27/2019), 2.25 mg (01/03/2020), 2.25 mg (01/10/2020)   for chemotherapy treatment.       CANCER STAGING:  Cancer Staging  No matching staging information was Arnold for the patient.  INTERVAL HISTORY:  Courtney Arnold, a 80 y.o. female, returns for routine follow-up of her IgA kappa plasma cell myeloma. Courtney Arnold was last seen on 12/15/2020.   Today she reports feeling good. She has gained 2 pounds since her last visit. She did not notice a change in  energy following her last iron infusion. She is not currently taking potassium. She denies n/v/d/c, recent infections, tingling/numbness, new pains, and fatigue.   REVIEW OF SYSTEMS:  Review of Systems  Constitutional:  Negative for appetite change, fatigue and unexpected weight change (+2 lbs).  Gastrointestinal:  Negative for constipation, diarrhea, nausea and vomiting.  Neurological:  Negative for numbness.  All other systems reviewed and are negative.  PAST MEDICAL/SURGICAL HISTORY:  Past Medical History:  Diagnosis Date   Atrial fibrillation (HCC) 2011   Postop, spontaneous conversion to normal sinus after one hour   Compression fracture 07/24/09   T12; kyphoplasty   History of echocardiogram 5/11   EF 65%   Hypertension    Thyroid disease    Tobacco abuse    Past Surgical History:  Procedure Laterality Date   BACK SURGERY     BACK SURGERY  06/06/2015   BREAST EXCISIONAL BIOPSY Left    50 years ago  benign   CHOLECYSTECTOMY N/A 04/25/2015   Procedure: LAPAROSCOPIC CHOLECYSTECTOMY WITH INTRAOPERATIVE CHOLANGIOGRAM;  Surgeon: Courtney Pickle, MD;  Location: [facility];  Service: General;  Laterality: N/A;   COLONOSCOPY N/A 11/26/2015   Procedure: COLONOSCOPY;  Surgeon: Courtney Ade, MD;  Location: [facility];  Service: Endoscopy;  Laterality: N/A;  7:30 am   ERCP N/A 04/14/2015   Procedure: ENDOSCOPIC RETROGRADE CHOLANGIOPANCREATOGRAPHY (ERCP) Biliary Sphincterotomy, 10x7 stent placement Dilated bilary system just not well seen;  Surgeon: Courtney Hippo, MD;  Location: [facility];  Service: Endoscopy;  Laterality: N/A;   ERCP N/A 06/12/2015   Procedure: ENDOSCOPIC RETROGRADE CHOLANGIOPANCREATOGRAPHY (ERCP);  Surgeon: Courtney Hippo,  MD;  Location[facility];  Service: Endoscopy;  Laterality: N/A;   ESOPHAGOGASTRODUODENOSCOPY N/A 06/12/2015   Procedure: DIAGNOSTIC ESOPHAGOGASTRODUODENOSCOPY (EGD);  Surgeon: Courtney Houston, MD;  [facility];  Service: Endoscopy;   Laterality: N/A;   FEMUR IM NAIL Right 05/11/2019   Procedure: RETROGRADE INTRAMEDULLARY NAIL FEMORAL;  Surgeon: Courtney Needles, MD;  Location: [facility];  Service: Orthopedics;  Laterality: Right;   STENT REMOVAL  06/12/2015   Procedure: STENT REMOVAL ;  Surgeon: Courtney Houston, MD;  [facility];  Service: Endoscopy;;    SOCIAL HISTORY:  Social History   Socioeconomic History   Marital status: Widowed    Spouse name: Not on file   Number of children: 2   Years of education: Not on file   Highest education level: Not on file  Occupational History   Occupation: retired  Tobacco Use   Smoking status: Former    Packs/day: 0.15    Years: 20.00    Pack years: 3.00    Types: Cigarettes    Quit date: 03/08/2013    Years since quitting: 7.9   Smokeless tobacco: Never  Vaping Use   Vaping Use: Never used  Substance and Sexual Activity   Alcohol use: No    Alcohol/week: 0.0 standard drinks   Drug use: No   Sexual activity: Not Currently  Other Topics Concern   Not on file  Social History Narrative   Active in gardens and does yard work.   Social Determinants of Health   Financial Resource Strain: Not on file  Food Insecurity: Not on file  Transportation Needs: Not on file  Physical Activity: Not on file  Stress: Not on file  Social Connections: Not on file  Intimate Partner Violence: Not on file    FAMILY HISTORY:  Family History  Problem Relation Age of Onset   Heart attack Father    Stroke Father    Breast cancer Sister    Thyroid disease Neg Hx    Colon cancer Neg Hx     CURRENT MEDICATIONS:  Current Outpatient Medications  Medication Sig Dispense Refill   acyclovir (ZOVIRAX) 400 MG tablet TAKE (1) TABLET BY MOUTH TWICE DAILY. 60 tablet 3   aspirin EC 81 MG tablet Take 81 mg by mouth daily.     carboxymethylcellulose (REFRESH PLUS) 0.5 % SOLN Place 1 drop into both eyes in the morning, at noon, in the evening, and at bedtime.     Cholecalciferol (VITAMIN  D) 125 MCG (5000 UT) CAPS Take 5,000 Units by mouth daily. 90 capsule 1   dexamethasone (DECADRON) 4 MG tablet TAKE 2 TABLETS BY MOUTH once weekly 30 tablet 3   lenalidomide (REVLIMID) 15 MG capsule Take one capsule daily on days 1-14 every 21 days. 14 capsule 0   levothyroxine (SYNTHROID) 112 MCG tablet Take 1 tablet (112 mcg total) by mouth daily. 90 tablet 3   prednisoLONE acetate (PRED FORTE) 1 % ophthalmic suspension Place 1 drop into the right eye 4 (four) times daily.     No current facility-administered medications for this visit.    ALLERGIES:  No Known Allergies  PHYSICAL EXAM:  Performance status (ECOG): 1 - Symptomatic but completely ambulatory  Vitals:   02/09/21 1520  BP: 120/75  Pulse: 72  Resp: 18  Temp: 98.3 F (36.8 C)  SpO2: 100%   Wt Readings from Last 3 Encounters:  02/09/21 143 lb 12.8 oz (65.2 kg)  12/15/20 141 lb 14.4 oz (  64.4 kg)  11/17/20 140 lb 6.4 oz (63.7 kg)   Physical Exam Vitals reviewed.  Constitutional:      Appearance: Normal appearance.  Cardiovascular:     Rate and Rhythm: Normal rate and regular rhythm.     Pulses: Normal pulses.     Heart sounds: Normal heart sounds.  Pulmonary:     Effort: Pulmonary effort is normal.     Breath sounds: Normal breath sounds.  Musculoskeletal:     Right lower leg: No edema.     Left lower leg: No edema.  Neurological:     General: No focal deficit present.     Mental Status: She is alert and oriented to person, place, and time.  Psychiatric:        Mood and Affect: Mood normal.        Behavior: Behavior normal.     LABORATORY DATA:  I have reviewed the labs as listed.  CBC Latest Ref Rng & Units 02/09/2021 12/15/2020 10/20/2020  WBC 4.0 - 10.5 K/uL 2.5(L) 3.0(L) 3.4(L)  Hemoglobin 12.0 - 15.0 g/dL 9.1(L) 8.5(L) 9.8(L)  Hematocrit 36.0 - 46.0 % 28.0(L) 26.5(L) 31.3(L)  Platelets 150 - 400 K/uL 104(L) 196 160   CMP Latest Ref Rng & Units 02/09/2021 01/12/2021 12/15/2020  Glucose 70 - 99 mg/dL  97 95 102(H)  BUN 8 - 23 mg/dL _0 Creatinine 0.44 - 1.00 mg/dL 0.74 1.08(H) 0.90  Sodium 135 - 145 mmol/L 136 137 138  Potassium 3.5 - 5.1 mmol/L 3.1(L) 3.7 3.3(L)  Chloride 98 - 111 mmol/L 101 99 100  CO2 22 - 32 mmol/L _1 Calcium 8.9 - 10.3 mg/dL 8.4(L) 9.6 9.0  Total Protein 6.5 - 8.1 g/dL 6.0(L) 6.3(L) 6.2(L)  Total Bilirubin 0.3 - 1.2 mg/dL 0.3 0.3 0.6  Alkaline Phos 38 - 126 U/L 73 86 76  AST 15 - 41 U/L 15 18 14(L)  ALT 0 - 44 U/L _2 DIAGNOSTIC IMAGING:  I have independently reviewed the scans and discussed with the patient. No results Arnold.   ASSESSMENT:  1.  IgA kappa plasma cell myeloma: -IgA kappa plasma cell myeloma diagnosed on right sacral bone biopsy on 05/17/2019.  SPEP did not show M spike.  Immunofixation shows IgA kappa.  Kappa light chains 42.8, lambda light chains at 9.9, ratio 4.32.  Beta-2 microglobulin 3.7. -PET scan on 06/04/2019 showed expansile lytic lesion involving the right sacral wing.  Lytic lesion involving T10 vertebral body.  Left seventh rib lesion.  Increased uptake in a pathological fracture involving distal aspect of the right femur. 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 -RVD started on 06/26/2019.  Revlimid 15 mg 2 weeks on/1 week off. -Velcade discontinued on 01/10/2020. -She is taking Revlimid 15 mg 2 weeks on 1 week off, with dexamethasone 20 mg on weeks of Revlimid and 10 mg while she is off Revlimid.   PLAN:  1.  IgA kappa plasma cell myeloma: - We reviewed myeloma panel from 12/15/2020.  M spike is not detectable.  Free light chain ratio is 2.26 and stable.  Kappa light chains are 22.  Immunofixation shows IgA kappa. - Reviewed labs from 02/09/2021 which showed white count 2.5 with ANC of 0.9.  Platelet count 104.  Hemoglobin 9.1. - Continue Revlimid 15 mg 2 weeks on 1 week off.  Continue dexamethasone 8 mg on Thursdays. - RTC 8  weeks for follow-up  with repeat myeloma labs.   2.  Hypokalemia: - She stopped taking potassium.  Potassium today 3.1. - She was told to start back on OTC potassium daily.   3.  Normocytic anemia: - She received Feraheme x2 in September. - Ferritin today is 392 with percent saturation 19.  Hemoglobin is 9.1.  Partly anemia from myelosuppression from Revlimid.   4.  ID prophylaxis: - Continue acyclovir twice daily. - Continue aspirin for thromboprophylaxis.   5.  Myeloma bone disease: - Continue calcium tablets daily.  Calcium is within normal limits.  Continue monthly denosumab.   Orders placed this encounter:  No orders of the defined types were placed in this encounter.    Derek Jack, MD [facility] [phone]   I, Thana Ates, am acting as a scribe for Dr. Derek Jack.  I, Derek Jack MD, have reviewed the above documentation for accuracy and completeness, and I agree with the above.

## 2021-02-09 ENCOUNTER — Inpatient Hospital Stay (HOSPITAL_COMMUNITY): Payer: Medicare Other | Attending: Hematology

## 2021-02-09 ENCOUNTER — Inpatient Hospital Stay (HOSPITAL_COMMUNITY): Payer: Medicare Other

## 2021-02-09 ENCOUNTER — Other Ambulatory Visit: Payer: Self-pay

## 2021-02-09 ENCOUNTER — Inpatient Hospital Stay (HOSPITAL_BASED_OUTPATIENT_CLINIC_OR_DEPARTMENT_OTHER): Payer: Medicare Other | Admitting: Hematology

## 2021-02-09 VITALS — BP 120/75 | HR 72 | Temp 98.3°F | Resp 18 | Wt 143.8 lb

## 2021-02-09 DIAGNOSIS — D513 Other dietary vitamin B12 deficiency anemia: Secondary | ICD-10-CM | POA: Diagnosis not present

## 2021-02-09 DIAGNOSIS — C9000 Multiple myeloma not having achieved remission: Secondary | ICD-10-CM

## 2021-02-09 DIAGNOSIS — D649 Anemia, unspecified: Secondary | ICD-10-CM | POA: Insufficient documentation

## 2021-02-09 DIAGNOSIS — E876 Hypokalemia: Secondary | ICD-10-CM | POA: Diagnosis not present

## 2021-02-09 DIAGNOSIS — D520 Dietary folate deficiency anemia: Secondary | ICD-10-CM | POA: Diagnosis not present

## 2021-02-09 LAB — CBC WITH DIFFERENTIAL/PLATELET
Abs Immature Granulocytes: 0 10*3/uL (ref 0.00–0.07)
Basophils Absolute: 0 10*3/uL (ref 0.0–0.1)
Basophils Relative: 0 %
Eosinophils Absolute: 0.1 10*3/uL (ref 0.0–0.5)
Eosinophils Relative: 5 %
HCT: 28 % — ABNORMAL LOW (ref 36.0–46.0)
Hemoglobin: 9.1 g/dL — ABNORMAL LOW (ref 12.0–15.0)
Immature Granulocytes: 0 %
Lymphocytes Relative: 49 %
Lymphs Abs: 1.2 10*3/uL (ref 0.7–4.0)
MCH: 33.2 pg (ref 26.0–34.0)
MCHC: 32.5 g/dL (ref 30.0–36.0)
MCV: 102.2 fL — ABNORMAL HIGH (ref 80.0–100.0)
Monocytes Absolute: 0.3 10*3/uL (ref 0.1–1.0)
Monocytes Relative: 12 %
Neutro Abs: 0.9 10*3/uL — ABNORMAL LOW (ref 1.7–7.7)
Neutrophils Relative %: 34 %
Platelets: 104 10*3/uL — ABNORMAL LOW (ref 150–400)
RBC: 2.74 MIL/uL — ABNORMAL LOW (ref 3.87–5.11)
RDW: 18 % — ABNORMAL HIGH (ref 11.5–15.5)
WBC: 2.5 10*3/uL — ABNORMAL LOW (ref 4.0–10.5)
nRBC: 0 % (ref 0.0–0.2)

## 2021-02-09 LAB — COMPREHENSIVE METABOLIC PANEL
ALT: 15 U/L (ref 0–44)
AST: 15 U/L (ref 15–41)
Albumin: 3.6 g/dL (ref 3.5–5.0)
Alkaline Phosphatase: 73 U/L (ref 38–126)
Anion gap: 7 (ref 5–15)
BUN: 13 mg/dL (ref 8–23)
CO2: 28 mmol/L (ref 22–32)
Calcium: 8.4 mg/dL — ABNORMAL LOW (ref 8.9–10.3)
Chloride: 101 mmol/L (ref 98–111)
Creatinine, Ser: 0.74 mg/dL (ref 0.44–1.00)
GFR, Estimated: >60 mL/min (ref >60)
Glucose, Bld: 97 mg/dL (ref 70–99)
Potassium: 3.1 mmol/L — ABNORMAL LOW (ref 3.5–5.1)
Sodium: 136 mmol/L (ref 135–145)
Total Bilirubin: 0.3 mg/dL (ref 0.3–1.2)
Total Protein: 6 g/dL — ABNORMAL LOW (ref 6.5–8.1)

## 2021-02-09 LAB — FERRITIN: Ferritin: 392 ng/mL — ABNORMAL HIGH (ref 11–307)

## 2021-02-09 LAB — IRON AND TIBC
Iron: 69 ug/dL (ref 28–170)
Saturation Ratios: 19 % (ref 10.4–31.8)
TIBC: 368 ug/dL (ref 250–450)
UIBC: 299 ug/dL

## 2021-02-09 LAB — LACTATE DEHYDROGENASE: LDH: 112 U/L (ref 98–192)

## 2021-02-09 MED ORDER — DENOSUMAB 120 MG/1.7ML ~~LOC~~ SOLN
120.0000 mg | Freq: Once | SUBCUTANEOUS | Status: AC
  Administered 2021-02-09: 120 mg via SUBCUTANEOUS
  Filled 2021-02-09: qty 1.7

## 2021-02-09 NOTE — Patient Instructions (Signed)
[place] at [facility] Discharge Instructions  You were seen and examined today by Dr. Delton Coombes. He reviewed your most recent labs and everything looks stable. The labs that are low are coming from the Revlimid. Continue taking your Revlimid two weeks on one week off. Start taking potassium over the counter once daily. Please keep follow up appointment as scheduled.   Thank you for choosing [place] [facility] at [facility] to provide your oncology and hematology care.  To afford each patient quality time with our provider, please arrive at least 15 minutes before your scheduled appointment time.   If you have a lab appointment with the [facility] please come in thru the Main Entrance and check in at the main information desk.  You need to re-schedule your appointment should you arrive 10 or more minutes late.  We strive to give you quality time with our providers, and arriving late affects you and other patients whose appointments are after yours.  Also, if you no show three or more times for appointments you may be dismissed from the clinic at the providers discretion.     Again, thank you for choosing [facility].  Our hope is that these requests will decrease the amount of time that you wait before being seen by our physicians.       _____________________________________________________________  Should you have questions after your visit to [facility], please contact our office at [phone] and follow the prompts.  Our office hours are 8:00 a.m. and 4:30 p.m. Monday - Friday.  Please note that voicemails left after 4:00 p.m. may not be returned until the following business day.  We are closed weekends and major holidays.  You do have access to a nurse 24-7, just call the main number to the clinic [phone] and do not press any options, hold on the line and a nurse will answer the phone.    For prescription refill  requests, have your pharmacy contact our office and allow 72 hours.    Due to Covid, you will need to wear a mask upon entering the hospital. If you do not have a mask, a mask will be given to you at the Main Entrance upon arrival. For doctor visits, patients may have 1 support person age 73 or older with them. For treatment visits, patients can not have anyone with them due to social distancing guidelines and our immunocompromised population.

## 2021-02-09 NOTE — Progress Notes (Signed)
Courtney Arnold presents today for injection per the provider's orders.  Xgeva administration without incident; injection site WNL; see MAR for injection details.Pt takes Calcium and Vit D supplements as directed. No recent or future dental appointments. No tooth or jaw pain noted. Patient tolerated procedure well and without incident.  No questions or complaints noted at this time.   Discharged from clinic ambulatory in stable condition. Alert and oriented x 3. F/U with [facility] as scheduled.

## 2021-02-09 NOTE — Patient Instructions (Signed)
[facility]  Discharge Instructions: Thank you for choosing [facility] to provide your oncology and hematology care.  If you have a lab appointment with the [facility], please come in thru the Main Entrance and check in at the main information desk.  Wear comfortable clothing and clothing appropriate for easy access to any Portacath or PICC line.   We strive to give you quality time with your provider. You may need to reschedule your appointment if you arrive late (15 or more minutes).  Arriving late affects you and other patients whose appointments are after yours.  Also, if you miss three or more appointments without notifying the office, you may be dismissed from the clinic at the provider's discretion.      For prescription refill requests, have your pharmacy contact our office and allow 72 hours for refills to be completed.    Today you received Xgeva injections.     BELOW ARE SYMPTOMS THAT SHOULD BE REPORTED IMMEDIATELY: *FEVER GREATER THAN 100.4 F (38 C) OR HIGHER *CHILLS OR SWEATING *NAUSEA AND VOMITING THAT IS NOT CONTROLLED WITH YOUR NAUSEA MEDICATION *UNUSUAL SHORTNESS OF BREATH *UNUSUAL BRUISING OR BLEEDING *URINARY PROBLEMS (pain or burning when urinating, or frequent urination) *BOWEL PROBLEMS (unusual diarrhea, constipation, pain near the anus) TENDERNESS IN MOUTH AND THROAT WITH OR WITHOUT PRESENCE OF ULCERS (sore throat, sores in mouth, or a toothache) UNUSUAL RASH, SWELLING OR PAIN  UNUSUAL VAGINAL DISCHARGE OR ITCHING   Items with * indicate a potential emergency and should be followed up as soon as possible or go to the Emergency Department if any problems should occur.  Please show the CHEMOTHERAPY ALERT CARD or IMMUNOTHERAPY ALERT CARD at check-in to the Emergency Department and triage nurse.  Should you have questions after your visit or need to cancel or reschedule your appointment, please contact [facility]  [phone]  and follow the prompts.  Office hours are 8:00 a.m. to 4:30 p.m. Monday - Friday. Please note that voicemails left after 4:00 p.m. may not be returned until the following business day.  We are closed weekends and major holidays. You have access to a nurse at all times for urgent questions. Please call the main number to the clinic [phone] and follow the prompts.  For any non-urgent questions, you may also contact your provider using MyChart. We now offer e-Visits for anyone 106 and older to request care online for non-urgent symptoms. For details visit [email or web address].   Also download the MyChart app! Go to the app store, search "MyChart", open the app, select [facility], and log in with your MyChart username and password.  Due to Covid, a mask is required upon entering the hospital/clinic. If you do not have a mask, one will be given to you upon arrival. For doctor visits, patients may have 1 support person aged 40 or older with them. For treatment visits, patients cannot have anyone with them due to current Covid guidelines and our immunocompromised population.

## 2021-02-09 NOTE — Progress Notes (Signed)
Patient has been assessed, vital signs and labs have been reviewed by Dr. Katragadda. ANC, Creatinine, LFTs, and Platelets are within treatment parameters per Dr. Katragadda. The patient is good to proceed with treatment at this time. Primary RN and pharmacy aware.  

## 2021-02-10 LAB — KAPPA/LAMBDA LIGHT CHAINS
Kappa free light chain: 18.8 mg/L (ref 3.3–19.4)
Kappa, lambda light chain ratio: 2.11 — ABNORMAL HIGH (ref 0.26–1.65)
Lambda free light chains: 8.9 mg/L (ref 5.7–26.3)

## 2021-02-11 LAB — PROTEIN ELECTROPHORESIS, SERUM
A/G Ratio: 1.6 (ref 0.7–1.7)
Albumin ELP: 3.5 g/dL (ref 2.9–4.4)
Alpha-1-Globulin: 0.2 g/dL (ref 0.0–0.4)
Alpha-2-Globulin: 0.8 g/dL (ref 0.4–1.0)
Beta Globulin: 1 g/dL (ref 0.7–1.3)
Gamma Globulin: 0.2 g/dL — ABNORMAL LOW (ref 0.4–1.8)
Globulin, Total: 2.2 g/dL (ref 2.2–3.9)
Total Protein ELP: 5.7 g/dL — ABNORMAL LOW (ref 6.0–8.5)

## 2021-02-13 LAB — IMMUNOFIXATION ELECTROPHORESIS
IgA: 173 mg/dL (ref 64–422)
IgG (Immunoglobin G), Serum: 276 mg/dL — ABNORMAL LOW (ref 586–1602)
IgM (Immunoglobulin M), Srm: 14 mg/dL — ABNORMAL LOW (ref 26–217)
Total Protein ELP: 5.6 g/dL — ABNORMAL LOW (ref 6.0–8.5)

## 2021-02-16 ENCOUNTER — Other Ambulatory Visit (HOSPITAL_COMMUNITY): Payer: Self-pay

## 2021-02-16 DIAGNOSIS — C9000 Multiple myeloma not having achieved remission: Secondary | ICD-10-CM

## 2021-02-16 MED ORDER — LENALIDOMIDE 15 MG PO CAPS: ORAL_CAPSULE | ORAL | 0 refills | Status: DC

## 2021-02-16 NOTE — Telephone Encounter (Signed)
Chart reviewed. Revlimid refilled per last office note with Dr. Katragadda.  

## 2021-02-17 ENCOUNTER — Other Ambulatory Visit: Payer: Self-pay

## 2021-02-17 ENCOUNTER — Encounter: Payer: Self-pay | Admitting: Endocrinology

## 2021-02-17 ENCOUNTER — Ambulatory Visit (INDEPENDENT_AMBULATORY_CARE_PROVIDER_SITE_OTHER): Payer: Medicare Other | Admitting: Endocrinology

## 2021-02-17 VITALS — BP 116/58 | HR 74 | Ht 60.5 in | Wt 144.6 lb

## 2021-02-17 DIAGNOSIS — E039 Hypothyroidism, unspecified: Secondary | ICD-10-CM

## 2021-02-17 LAB — T4, FREE: Free T4: 0.88 ng/dL (ref 0.60–1.60)

## 2021-02-17 LAB — TSH: TSH: 12.14 u[IU]/mL — ABNORMAL HIGH (ref 0.35–5.50)

## 2021-02-17 MED ORDER — LEVOTHYROXINE SODIUM 137 MCG PO TABS: 137.0000 ug | ORAL_TABLET | Freq: Every day | ORAL | 3 refills | Status: DC

## 2021-02-17 NOTE — Progress Notes (Signed)
Subjective:    Patient ID: Courtney Arnold, female    DOB: March 11, 1940, 80 y.o.   MRN: [id number]  HPI Pt returns for f/u of hyperthyroidism (head CT showed Graves Ophthalmopathy; dx'ed 2017; after a few mos of tapazole, she took RAI; she has been on Synthroid since then).  pt states she feels well in general.  She takes synthroid as rx'ed.   Past Medical History:  Diagnosis Date   Atrial fibrillation ([place]) 2011   Postop, spontaneous conversion to normal sinus after one hour   Compression fracture 07/24/09   T12; kyphoplasty   History of echocardiogram 5/11   EF 65%   Hypertension    Thyroid disease    Tobacco abuse     Past Surgical History:  Procedure Laterality Date   BACK SURGERY     BACK SURGERY  06/06/2015   BREAST EXCISIONAL BIOPSY Left    50 years ago  benign   CHOLECYSTECTOMY N/A 04/25/2015   Procedure: LAPAROSCOPIC CHOLECYSTECTOMY WITH INTRAOPERATIVE CHOLANGIOGRAM;  Surgeon: Mickeal Skinner, MD;  [facility];  Service: General;  Laterality: N/A;   COLONOSCOPY N/A 11/26/2015   Procedure: COLONOSCOPY;  Surgeon: Daneil Dolin, MD;  [facility];  Service: Endoscopy;  Laterality: N/A;  7:30 am   ERCP N/A 04/14/2015   Procedure: ENDOSCOPIC RETROGRADE CHOLANGIOPANCREATOGRAPHY (ERCP) Biliary Sphincterotomy, 10x7 stent placement Dilated bilary system just not well seen;  Surgeon: Rogene Houston, MD;  [facility]: [facility];  Service: Endoscopy;  Laterality: N/A;   ERCP N/A 06/12/2015   Procedure: ENDOSCOPIC RETROGRADE CHOLANGIOPANCREATOGRAPHY (ERCP);  Surgeon: Rogene Houston, MD;  [facility];  Service: Endoscopy;  Laterality: N/A;   ESOPHAGOGASTRODUODENOSCOPY N/A 06/12/2015   Procedure: DIAGNOSTIC ESOPHAGOGASTRODUODENOSCOPY (EGD);  Surgeon: Rogene Houston, MD;  [facility];  Service: Endoscopy;  Laterality: N/A;   FEMUR IM NAIL Right 05/11/2019   Procedure: RETROGRADE INTRAMEDULLARY NAIL FEMORAL;  Surgeon: Shona Needles, MD;  Location: [facility];   Service: Orthopedics;  Laterality: Right;   STENT REMOVAL  06/12/2015   Procedure: STENT REMOVAL ;  Surgeon: Rogene Houston, MD;  [facility];  Service: Endoscopy;;    Social History   Socioeconomic History   Marital status: Widowed    Spouse name: Not on file   Number of children: 2   Years of education: Not on file   Highest education level: Not on file  Occupational History   Occupation: retired  Tobacco Use   Smoking status: Former    Packs/day: 0.15    Years: 20.00    Pack years: 3.00    Types: Cigarettes    Quit date: 03/08/2013    Years since quitting: 7.9   Smokeless tobacco: Never  Vaping Use   Vaping Use: Never used  Substance and Sexual Activity   Alcohol use: No    Alcohol/week: 0.0 standard drinks   Drug use: No   Sexual activity: Not Currently  Other Topics Concern   Not on file  Social History Narrative   Active in gardens and does yard work.   Social Determinants of Health   Financial Resource Strain: Not on file  Food Insecurity: Not on file  Transportation Needs: Not on file  Physical Activity: Not on file  Stress: Not on file  Social Connections: Not on file  Intimate Partner Violence: Not on file    Current Outpatient Medications on File Prior to Visit  Medication Sig Dispense Refill   acyclovir (ZOVIRAX)  400 MG tablet TAKE (1) TABLET BY MOUTH TWICE DAILY. 60 tablet 3   aspirin EC 81 MG tablet Take 81 mg by mouth daily.     carboxymethylcellulose (REFRESH PLUS) 0.5 % SOLN Place 1 drop into both eyes in the morning, at noon, in the evening, and at bedtime.     Cholecalciferol (VITAMIN D) 125 MCG (5000 UT) CAPS Take 5,000 Units by mouth daily. 90 capsule 1   dexamethasone (DECADRON) 4 MG tablet TAKE 2 TABLETS BY MOUTH once weekly 30 tablet 3   lenalidomide (REVLIMID) 15 MG capsule Take one capsule daily on days 1-14 every 21 days. 14 capsule 0   prednisoLONE acetate (PRED FORTE) 1 % ophthalmic suspension Place 1 drop into the right eye  4 (four) times daily.     No current facility-administered medications on file prior to visit.    No Known Allergies  Family History  Problem Relation Age of Onset   Heart attack Father    Stroke Father    Breast cancer Sister    Thyroid disease Neg Hx    Colon cancer Neg Hx     BP (!) 116/58 (BP Location: Left Arm, Patient Position: Sitting, Cuff Size: Normal)    Pulse 74    Ht 5' 0.5" (1.537 m)    Wt 144 lb 9.6 oz (65.6 kg)    SpO2 95%    BMI 27.78 kg/m    Review of Systems     Objective:   Physical Exam VITAL SIGNS:  See vs page GENERAL: no distress NECK: There is no palpable thyroid enlargement.  No thyroid nodule is palpable.  No palpable lymphadenopathy at the anterior neck.   Lab Results  Component Value Date   TSH 12.14 (H) 02/17/2021   T3TOTAL 206 (H) 04/15/2015      Assessment & Plan:  Hypothyroidism: uncontrolled.  I have sent a prescription to your pharmacy, to increase synthroid. Please come back for a follow-up appointment in 2 months.

## 2021-02-17 NOTE — Patient Instructions (Signed)
blood tests are requested for you today.  We'll let you know about the results.  It is best to never miss the medication.  However, if you do miss it, next best is to double up the next time.   Please come back for a follow-up appointment in 1 year.

## 2021-03-06 ENCOUNTER — Other Ambulatory Visit (HOSPITAL_COMMUNITY): Payer: Self-pay

## 2021-03-06 DIAGNOSIS — C9000 Multiple myeloma not having achieved remission: Secondary | ICD-10-CM

## 2021-03-06 MED ORDER — LENALIDOMIDE 15 MG PO CAPS: ORAL_CAPSULE | ORAL | 0 refills | Status: DC

## 2021-03-06 NOTE — Telephone Encounter (Signed)
Chart reviewed. Revlimid refilled per last office note with Dr. Katragadda.  

## 2021-03-10 ENCOUNTER — Inpatient Hospital Stay (HOSPITAL_COMMUNITY): Payer: Medicare Other

## 2021-03-10 ENCOUNTER — Other Ambulatory Visit: Payer: Self-pay

## 2021-03-10 ENCOUNTER — Encounter (HOSPITAL_COMMUNITY): Payer: Self-pay

## 2021-03-10 ENCOUNTER — Inpatient Hospital Stay (HOSPITAL_COMMUNITY): Payer: Medicare Other | Attending: Hematology

## 2021-03-10 VITALS — BP 102/53 | HR 89 | Temp 98.1°F | Resp 18

## 2021-03-10 DIAGNOSIS — C9000 Multiple myeloma not having achieved remission: Secondary | ICD-10-CM | POA: Insufficient documentation

## 2021-03-10 LAB — COMPREHENSIVE METABOLIC PANEL
ALT: 21 U/L (ref 0–44)
AST: 20 U/L (ref 15–41)
Albumin: 3.6 g/dL (ref 3.5–5.0)
Alkaline Phosphatase: 87 U/L (ref 38–126)
Anion gap: 10 (ref 5–15)
BUN: 17 mg/dL (ref 8–23)
CO2: 28 mmol/L (ref 22–32)
Calcium: 7.8 mg/dL — ABNORMAL LOW (ref 8.9–10.3)
Chloride: 98 mmol/L (ref 98–111)
Creatinine, Ser: 0.8 mg/dL (ref 0.44–1.00)
GFR, Estimated: >60 mL/min (ref >60)
Glucose, Bld: 106 mg/dL — ABNORMAL HIGH (ref 70–99)
Potassium: 3.2 mmol/L — ABNORMAL LOW (ref 3.5–5.1)
Sodium: 136 mmol/L (ref 135–145)
Total Bilirubin: 0.2 mg/dL — ABNORMAL LOW (ref 0.3–1.2)
Total Protein: 6.3 g/dL — ABNORMAL LOW (ref 6.5–8.1)

## 2021-03-10 MED ORDER — DENOSUMAB 120 MG/1.7ML ~~LOC~~ SOLN
120.0000 mg | Freq: Once | SUBCUTANEOUS | Status: AC
  Administered 2021-03-10: 120 mg via SUBCUTANEOUS
  Filled 2021-03-10: qty 1.7

## 2021-03-10 NOTE — Progress Notes (Signed)
Corrected calcium 8.1  OK to proceed with corrected calcium >8 per Dr Delton Coombes.  T.O. Dr Rhys Martini, PharmD

## 2021-03-10 NOTE — Progress Notes (Signed)
Pt here for x-geva today.  Calcium 7.8, corrected calcium 8.12.  okay to give per Dr Raliegh Ip if corrected calcium is greater than 8.   Courtney Arnold presents today for injection per the provider's orders.  X-geva administration without incident; injection site WNL; see MAR for injection details.  Patient tolerated procedure well and without incident.  No questions or complaints noted at this time. Stable during and after injection.  Tolerated injection without incidence today.  AVS reviewed.  Discharged in stable condition ambulatory.   Patient is taking revlimid as prescribed.  Pt has not missed any doses and reports no side effects at this time.

## 2021-03-10 NOTE — Patient Instructions (Signed)
[facility]  Discharge Instructions: Thank you for choosing [facility] to provide your oncology and hematology care.  If you have a lab appointment with the [facility], please come in thru the Main Entrance and check in at the main information desk.  Wear comfortable clothing and clothing appropriate for easy access to any Portacath or PICC line.   We strive to give you quality time with your provider. You may need to reschedule your appointment if you arrive late (15 or more minutes).  Arriving late affects you and other patients whose appointments are after yours.  Also, if you miss three or more appointments without notifying the office, you may be dismissed from the clinic at the providers discretion.      For prescription refill requests, have your pharmacy contact our office and allow 72 hours for refills to be completed.    Today you received the following chemotherapy and/or immunotherapy agents x-geva, please remember to take your calcium supplement.  Todays calcium level was 7.8.       To help prevent nausea and vomiting after your treatment, we encourage you to take your nausea medication as directed.  BELOW ARE SYMPTOMS THAT SHOULD BE REPORTED IMMEDIATELY: *FEVER GREATER THAN 100.4 F (38 C) OR HIGHER *CHILLS OR SWEATING *NAUSEA AND VOMITING THAT IS NOT CONTROLLED WITH YOUR NAUSEA MEDICATION *UNUSUAL SHORTNESS OF BREATH *UNUSUAL BRUISING OR BLEEDING *URINARY PROBLEMS (pain or burning when urinating, or frequent urination) *BOWEL PROBLEMS (unusual diarrhea, constipation, pain near the anus) TENDERNESS IN MOUTH AND THROAT WITH OR WITHOUT PRESENCE OF ULCERS (sore throat, sores in mouth, or a toothache) UNUSUAL RASH, SWELLING OR PAIN  UNUSUAL VAGINAL DISCHARGE OR ITCHING   Items with * indicate a potential emergency and should be followed up as soon as possible or go to the Emergency Department if any problems should occur.  Please show the  CHEMOTHERAPY ALERT CARD or IMMUNOTHERAPY ALERT CARD at check-in to the Emergency Department and triage nurse.  Should you have questions after your visit or need to cancel or reschedule your appointment, please contact [facility] [phone]  and follow the prompts.  Office hours are 8:00 a.m. to 4:30 p.m. Monday - Friday. Please note that voicemails left after 4:00 p.m. may not be returned until the following business day.  We are closed weekends and major holidays. You have access to a nurse at all times for urgent questions. Please call the main number to the clinic [phone] and follow the prompts.  For any non-urgent questions, you may also contact your provider using MyChart. We now offer e-Visits for anyone 75 and older to request care online for non-urgent symptoms. For details visit [email or web address].   Also download the MyChart app! Go to the app store, search "MyChart", open the app, select [facility], and log in with your MyChart username and password.  Due to Covid, a mask is required upon entering the hospital/clinic. If you do not have a mask, one will be given to you upon arrival. For doctor visits, patients may have 1 support person aged 63 or older with them. For treatment visits, patients cannot have anyone with them due to current Covid guidelines and our immunocompromised population.

## 2021-03-11 ENCOUNTER — Telehealth (HOSPITAL_COMMUNITY): Payer: Self-pay | Admitting: *Deleted

## 2021-03-11 NOTE — Telephone Encounter (Signed)
Daughter called and expressed that patient has been experiencing headaches, diarrhea and generalized malaise over the past two days.  Has been taking aleve for headache, however has not subsided.  Advised her to see her PCP to rule out possible viral infection.  She stated that she will perform a COVID home test and go from there.  She is to contact us with outcome.  Verbalized understanding.

## 2021-04-01 ENCOUNTER — Other Ambulatory Visit (HOSPITAL_COMMUNITY): Payer: Self-pay

## 2021-04-01 DIAGNOSIS — C9000 Multiple myeloma not having achieved remission: Secondary | ICD-10-CM

## 2021-04-01 MED ORDER — LENALIDOMIDE 15 MG PO CAPS: ORAL_CAPSULE | ORAL | 0 refills | Status: DC

## 2021-04-01 NOTE — Telephone Encounter (Signed)
Chart reviewed. Revlimid refilled per last office note with Dr. Katragadda.  

## 2021-04-07 ENCOUNTER — Inpatient Hospital Stay (HOSPITAL_COMMUNITY): Payer: Medicare Other

## 2021-04-07 ENCOUNTER — Other Ambulatory Visit: Payer: Self-pay

## 2021-04-07 ENCOUNTER — Inpatient Hospital Stay (HOSPITAL_BASED_OUTPATIENT_CLINIC_OR_DEPARTMENT_OTHER): Payer: Medicare Other | Admitting: Hematology

## 2021-04-07 VITALS — BP 109/64 | HR 82 | Temp 98.1°F | Resp 18 | Ht 60.5 in | Wt 143.4 lb

## 2021-04-07 DIAGNOSIS — D520 Dietary folate deficiency anemia: Secondary | ICD-10-CM

## 2021-04-07 DIAGNOSIS — D649 Anemia, unspecified: Secondary | ICD-10-CM | POA: Diagnosis not present

## 2021-04-07 DIAGNOSIS — C9000 Multiple myeloma not having achieved remission: Secondary | ICD-10-CM

## 2021-04-07 DIAGNOSIS — D513 Other dietary vitamin B12 deficiency anemia: Secondary | ICD-10-CM

## 2021-04-07 LAB — CBC WITH DIFFERENTIAL/PLATELET
Abs Immature Granulocytes: 0.01 10*3/uL (ref 0.00–0.07)
Basophils Absolute: 0 10*3/uL (ref 0.0–0.1)
Basophils Relative: 0 %
Eosinophils Absolute: 0.1 10*3/uL (ref 0.0–0.5)
Eosinophils Relative: 7 %
HCT: 26.5 % — ABNORMAL LOW (ref 36.0–46.0)
Hemoglobin: 8.4 g/dL — ABNORMAL LOW (ref 12.0–15.0)
Immature Granulocytes: 1 %
Lymphocytes Relative: 59 %
Lymphs Abs: 0.9 10*3/uL (ref 0.7–4.0)
MCH: 32.1 pg (ref 26.0–34.0)
MCHC: 31.7 g/dL (ref 30.0–36.0)
MCV: 101.1 fL — ABNORMAL HIGH (ref 80.0–100.0)
Monocytes Absolute: 0.1 10*3/uL (ref 0.1–1.0)
Monocytes Relative: 8 %
Neutro Abs: 0.4 10*3/uL — CL (ref 1.7–7.7)
Neutrophils Relative %: 25 %
Platelets: 95 10*3/uL — ABNORMAL LOW (ref 150–400)
RBC: 2.62 MIL/uL — ABNORMAL LOW (ref 3.87–5.11)
RDW: 17.6 % — ABNORMAL HIGH (ref 11.5–15.5)
WBC: 1.4 10*3/uL — CL (ref 4.0–10.5)
nRBC: 0 % (ref 0.0–0.2)

## 2021-04-07 LAB — COMPREHENSIVE METABOLIC PANEL
ALT: 23 U/L (ref 0–44)
AST: 18 U/L (ref 15–41)
Albumin: 3.6 g/dL (ref 3.5–5.0)
Alkaline Phosphatase: 94 U/L (ref 38–126)
Anion gap: 7 (ref 5–15)
BUN: 19 mg/dL (ref 8–23)
CO2: 31 mmol/L (ref 22–32)
Calcium: 9.1 mg/dL (ref 8.9–10.3)
Chloride: 100 mmol/L (ref 98–111)
Creatinine, Ser: 0.75 mg/dL (ref 0.44–1.00)
GFR, Estimated: >60 mL/min (ref >60)
Glucose, Bld: 116 mg/dL — ABNORMAL HIGH (ref 70–99)
Potassium: 3.4 mmol/L — ABNORMAL LOW (ref 3.5–5.1)
Sodium: 138 mmol/L (ref 135–145)
Total Bilirubin: 0.4 mg/dL (ref 0.3–1.2)
Total Protein: 6.3 g/dL — ABNORMAL LOW (ref 6.5–8.1)

## 2021-04-07 LAB — IRON AND TIBC
Iron: 57 ug/dL (ref 28–170)
Saturation Ratios: 15 % (ref 10.4–31.8)
TIBC: 381 ug/dL (ref 250–450)
UIBC: 324 ug/dL

## 2021-04-07 LAB — FERRITIN: Ferritin: 297 ng/mL (ref 11–307)

## 2021-04-07 LAB — VITAMIN B12: Vitamin B-12: 5718 pg/mL — ABNORMAL HIGH (ref 180–914)

## 2021-04-07 LAB — FOLATE: Folate: 11 ng/mL (ref >5.9)

## 2021-04-07 MED ORDER — DENOSUMAB 120 MG/1.7ML ~~LOC~~ SOLN
120.0000 mg | Freq: Once | SUBCUTANEOUS | Status: AC
  Administered 2021-04-07: 120 mg via SUBCUTANEOUS
  Filled 2021-04-07: qty 1.7

## 2021-04-07 MED ORDER — LENALIDOMIDE 10 MG PO CAPS: ORAL_CAPSULE | ORAL | 5 refills | Status: DC

## 2021-04-07 NOTE — Progress Notes (Signed)
Courtney Arnold presents today for Xgeva injection per the provider's orders.  Stable during administration without incident; injection site WNL; see MAR for injection details.  Patient tolerated procedure well and without incident.  No questions or complaints noted at this time.  Discharge from clinic ambulatory in stable condition.  Alert and oriented X 3.  Follow up with [facility] as scheduled.

## 2021-04-07 NOTE — Progress Notes (Signed)
[place], [name] [address]   CLINIC:  Medical Oncology/Hematology  PCP:  [name], MD [address] / [place] [address] [phone]   REASON FOR VISIT:  Follow-up for IgA kappa plasma cell myeloma  PRIOR THERAPY: Velcade x 9 cycles from 07/04/2019 to 01/10/2020  NGS Results: not done  CURRENT THERAPY: Revlimid 15 mg 2/3 weeks; Xgeva monthly  BRIEF ONCOLOGIC HISTORY:  Oncology History  Multiple myeloma not having achieved remission ([place])  06/07/2019 Initial Diagnosis   Multiple myeloma not having achieved remission ([place])   07/04/2019 - 01/10/2020 Chemotherapy   The patient had dexamethasone (DECADRON) 4 MG tablet, 1 of 1 cycle, Start date: 01/10/2020, End date: -- lenalidomide (REVLIMID) 15 MG capsule, 1 of 1 cycle, Start date: 02/26/2020, End date: -- bortezomib SQ (VELCADE) chemo injection 2.25 mg, 1.3 mg/m2 = 2.25 mg, Subcutaneous,  Once, 9 of 10 cycles Administration: 2.25 mg (07/04/2019), 2.25 mg (07/11/2019), 2.25 mg (07/26/2019), 2.25 mg (08/02/2019), 2.25 mg (08/09/2019), 2.25 mg (08/16/2019), 2.25 mg (08/23/2019), 2.25 mg (08/30/2019), 2.25 mg (09/06/2019), 2.25 mg (09/13/2019), 2.25 mg (09/20/2019), 2.25 mg (09/27/2019), 2.25 mg (10/04/2019), 2.25 mg (10/11/2019), 2.25 mg (10/18/2019), 2.25 mg (10/25/2019), 2.25 mg (11/01/2019), 2.25 mg (11/08/2019), 2.25 mg (11/15/2019), 2.25 mg (11/22/2019), 2.25 mg (11/29/2019), 2.25 mg (12/06/2019), 2.25 mg (12/13/2019), 2.25 mg (12/20/2019), 2.25 mg (12/27/2019), 2.25 mg (01/03/2020), 2.25 mg (01/10/2020)   for chemotherapy treatment.       CANCER STAGING:  Cancer Staging  No matching staging information was found for the patient.  INTERVAL HISTORY:  [name], a 81 y.o. female, returns for routine follow-up of her IgA kappa plasma cell myeloma. Courtney Arnold was last seen on 02/09/2021.   Today she reports feeling good, and she is accompanied by her daughter. Her appetite is good, and she denies  nausea. She is taking Revlimid 2 weeks on and 1 week off; she last dose of Revlimid before her off week will be tomorrow. She denies any current bleeding, and she denies fatigue. She denies new pains and infections.   REVIEW OF SYSTEMS:  Review of Systems  Constitutional:  Negative for appetite change and fatigue.  HENT:   Negative for nosebleeds.   Respiratory:  Negative for hemoptysis.   Gastrointestinal:  Negative for blood in stool and nausea.  Genitourinary:  Negative for hematuria and vaginal bleeding.   Hematological:  Does not bruise/bleed easily.  All other systems reviewed and are negative.  PAST MEDICAL/SURGICAL HISTORY:  Past Medical History:  Diagnosis Date   Atrial fibrillation ([place]) 2011   Postop, spontaneous conversion to normal sinus after one hour   Compression fracture 07/24/09   T12; kyphoplasty   History of echocardiogram 5/11   EF 65%   Hypertension    Thyroid disease    Tobacco abuse    Past Surgical History:  Procedure Laterality Date   BACK SURGERY     BACK SURGERY  06/06/2015   BREAST EXCISIONAL BIOPSY Left    50 years ago  benign   CHOLECYSTECTOMY N/A 04/25/2015   Procedure: LAPAROSCOPIC CHOLECYSTECTOMY WITH INTRAOPERATIVE CHOLANGIOGRAM;  Surgeon: Mickeal Skinner, MD;  [facility];  Service: General;  Laterality: N/A;   COLONOSCOPY N/A 11/26/2015   Procedure: COLONOSCOPY;  Surgeon: Daneil Dolin, MD;  [facility];  Service: Endoscopy;  Laterality: N/A;  7:30 am   ERCP N/A 04/14/2015   Procedure: ENDOSCOPIC RETROGRADE CHOLANGIOPANCREATOGRAPHY (ERCP) Biliary Sphincterotomy, 10x7 stent placement Dilated bilary system just  not well seen;  Surgeon: Rogene Houston, MD;  [facility];  Service: Endoscopy;  Laterality: N/A;   ERCP N/A 06/12/2015   Procedure: ENDOSCOPIC RETROGRADE CHOLANGIOPANCREATOGRAPHY (ERCP);  Surgeon: Rogene Houston, MD;  [facility];  Service: Endoscopy;  Laterality: N/A;   ESOPHAGOGASTRODUODENOSCOPY N/A  06/12/2015   Procedure: DIAGNOSTIC ESOPHAGOGASTRODUODENOSCOPY (EGD);  Surgeon: Rogene Houston, MD;  [facility];  Service: Endoscopy;  Laterality: N/A;   FEMUR IM NAIL Right 05/11/2019   Procedure: RETROGRADE INTRAMEDULLARY NAIL FEMORAL;  Surgeon: Shona Needles, MD;  Location: [facility];  Service: Orthopedics;  Laterality: Right;   STENT REMOVAL  06/12/2015   Procedure: STENT REMOVAL ;  Surgeon: Rogene Houston, MD;  [facility];  Service: Endoscopy;;    SOCIAL HISTORY:  Social History   Socioeconomic History   Marital status: Widowed    Spouse name: Not on file   Number of children: 2   Years of education: Not on file   Highest education level: Not on file  Occupational History   Occupation: retired  Tobacco Use   Smoking status: Former    Packs/day: 0.15    Years: 20.00    Pack years: 3.00    Types: Cigarettes    Quit date: 03/08/2013    Years since quitting: 8.0   Smokeless tobacco: Never  Vaping Use   Vaping Use: Never used  Substance and Sexual Activity   Alcohol use: No    Alcohol/week: 0.0 standard drinks   Drug use: No   Sexual activity: Not Currently  Other Topics Concern   Not on file  Social History Narrative   Active in gardens and does yard work.   Social Determinants of Health   Financial Resource Strain: Not on file  Food Insecurity: Not on file  Transportation Needs: Not on file  Physical Activity: Not on file  Stress: Not on file  Social Connections: Not on file  Intimate Partner Violence: Not on file    FAMILY HISTORY:  Family History  Problem Relation Age of Onset   Heart attack Father    Stroke Father    Breast cancer Sister    Thyroid disease Neg Hx    Colon cancer Neg Hx     CURRENT MEDICATIONS:  Current Outpatient Medications  Medication Sig Dispense Refill   acyclovir (ZOVIRAX) 400 MG tablet TAKE (1) TABLET BY MOUTH TWICE DAILY. 60 tablet 3   aspirin EC 81 MG tablet Take 81 mg by mouth daily.      carboxymethylcellulose (REFRESH PLUS) 0.5 % SOLN Place 1 drop into both eyes in the morning, at noon, in the evening, and at bedtime.     Cholecalciferol (VITAMIN D) 125 MCG (5000 UT) CAPS Take 5,000 Units by mouth daily. 90 capsule 1   dexamethasone (DECADRON) 4 MG tablet TAKE 2 TABLETS BY MOUTH once weekly 30 tablet 3   lenalidomide (REVLIMID) 15 MG capsule Take one capsule daily on days 1-14 every 21 days. 14 capsule 0   levothyroxine (SYNTHROID) 137 MCG tablet Take 1 tablet (137 mcg total) by mouth daily before breakfast. 90 tablet 3   prednisoLONE acetate (PRED FORTE) 1 % ophthalmic suspension Place 1 drop into the right eye 4 (four) times daily.     No current facility-administered medications for this visit.   Facility-Administered Medications Ordered in Other Visits  Medication Dose Route Frequency Provider Last Rate Last Admin   denosumab (XGEVA) injection 120 mg  120 mg Subcutaneous  Once Derek Jack, MD        ALLERGIES:  No Known Allergies  PHYSICAL EXAM:  Performance status (ECOG): 1 - Symptomatic but completely ambulatory  Vitals:   04/07/21 1333  BP: 109/64  Pulse: 82  Resp: 18  Temp: 98.1 F (36.7 C)  SpO2: 96%   Wt Readings from Last 3 Encounters:  04/07/21 143 lb 6.4 oz (65 kg)  02/17/21 144 lb 9.6 oz (65.6 kg)  02/09/21 143 lb 12.8 oz (65.2 kg)   Physical Exam Vitals reviewed.  Constitutional:      Appearance: Normal appearance.  Cardiovascular:     Rate and Rhythm: Normal rate and regular rhythm.     Pulses: Normal pulses.     Heart sounds: Normal heart sounds.  Pulmonary:     Effort: Pulmonary effort is normal.     Breath sounds: Normal breath sounds.  Musculoskeletal:     Right lower leg: No edema.     Left lower leg: No edema.  Neurological:     General: No focal deficit present.     Mental Status: She is alert and oriented to person, place, and time.  Psychiatric:        Mood and Affect: Mood normal.        Behavior: Behavior  normal.     LABORATORY DATA:  I have reviewed the labs as listed.  CBC Latest Ref Rng & Units 04/07/2021 02/09/2021 12/15/2020  WBC 4.0 - 10.5 K/uL 1.4(LL) 2.5(L) 3.0(L)  Hemoglobin 12.0 - 15.0 g/dL 8.4(L) 9.1(L) 8.5(L)  Hematocrit 36.0 - 46.0 % 26.5(L) 28.0(L) 26.5(L)  Platelets 150 - 400 K/uL 95(L) 104(L) 196   CMP Latest Ref Rng & Units 04/07/2021 03/10/2021 02/09/2021  Glucose 70 - 99 mg/dL 116(H) 106(H) 97  BUN 8 - 23 mg/dL _0 Creatinine 0.44 - 1.00 mg/dL 0.75 0.80 0.74  Sodium 135 - 145 mmol/L 138 136 136  Potassium 3.5 - 5.1 mmol/L 3.4(L) 3.2(L) 3.1(L)  Chloride 98 - 111 mmol/L 100 98 101  CO2 22 - 32 mmol/L _1 Calcium 8.9 - 10.3 mg/dL 9.1 7.8(L) 8.4(L)  Total Protein 6.5 - 8.1 g/dL 6.3(L) 6.3(L) 6.0(L)  Total Bilirubin 0.3 - 1.2 mg/dL 0.4 0.2(L) 0.3  Alkaline Phos 38 - 126 U/L 94 87 73  AST 15 - 41 U/L _2 ALT 0 - 44 U/L _3 DIAGNOSTIC IMAGING:  I have independently reviewed the scans and discussed with the patient. No results found.   ASSESSMENT:  1.  IgA kappa plasma cell myeloma: -IgA kappa plasma cell myeloma diagnosed on right sacral bone biopsy on 05/17/2019.  SPEP did not show M spike.  Immunofixation shows IgA kappa.  Kappa light chains 42.8, lambda light chains at 9.9, ratio 4.32.  Beta-2 microglobulin 3.7. -PET scan on 06/04/2019 showed expansile lytic lesion involving the right sacral wing.  Lytic lesion involving T10 vertebral body.  Left seventh rib lesion.  Increased uptake in a pathological fracture involving distal aspect of the right femur. 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 -RVD started on 06/26/2019.  Revlimid 15 mg 2 weeks on/1 week off. -Velcade discontinued on 01/10/2020. -She is taking Revlimid 15 mg 2 weeks on 1 week off, with dexamethasone 20 mg on weeks of Revlimid and 10 mg while she is off Revlimid.   PLAN:  1.  IgA kappa plasma cell myeloma: -  She  is taking Revlimid 15 mg 2 weeks on/1 week off.  She is taking dexamethasone 8 mg once weekly. - Reviewed myeloma labs from 02/09/2021 which showed M spike is 0.  Free light chain ratio is 2.11 with immunofixation showing IgA kappa. - Labs from today shows white count 1.4 with ANC of 400.  Platelet count is 95 and hemoglobin 8.4 with MCV 101.  Renal function and calcium are normal. - She has developed pancytopenia from Revlimid. - Recommend holding Revlimid at this time. - We will decrease the dose of Revlimid to 10 mg 2 weeks on/1 week off when we restarted after her counts normalized. - RTC 4 weeks with repeat labs.  She may continue dexamethasone weekly in the interim.   2.  Hypokalemia: - Continue OTC potassium daily.  Potassium today 3.4.   3.  Normocytic anemia: - She received Feraheme x2 in September. - Ferritin today is 297 with percent saturation 15.  Folic acid is normal.  However hemoglobin dropped to 8.4.  Likely from myelosuppression from Revlimid.   4.  ID prophylaxis: - Continue acyclovir twice daily.  Continue aspirin for thromboprophylaxis.   5.  Myeloma bone disease: - Continue calcium tablets daily. - Continue denosumab monthly.   Orders placed this encounter:  No orders of the defined types were placed in this encounter.    Derek Jack, MD [facility] [phone]   I, Thana Ates, am acting as a scribe for Dr. Derek Jack.  I, Derek Jack MD, have reviewed the above documentation for accuracy and completeness, and I agree with the above.

## 2021-04-07 NOTE — Progress Notes (Signed)
Patient has been assessed, vital signs and labs have been reviewed by Dr. Delton Coombes.  Dr. Delton Coombes is aware that wbc 1.4 and ANC 0.4, Creatinine, LFTs, and Platelets are within treatment parameters per Dr. Delton Coombes. The patient is good to proceed with Xgeva injection treatment at this time.  Primary RN and pharmacy aware.

## 2021-04-07 NOTE — Patient Instructions (Addendum)
[place] at [facility] Discharge Instructions  You were seen and examined today by Dr. Delton Coombes. He reviewed your most recent labs and your hemoglobin, platelets and white blood cell count are all low. Hold the Revlimid until you come back to see Dr. Delton Coombes. Continue taking the Dexamethasone. Dr. Delton Coombes sent in new prescription for Revlimid 10 mg just hold on to this and do not start it until you come back and see Dr. Delton Coombes.  Please keep follow up appointment as scheduled in 4 weeks.  You received your Xgeva injection today.   Thank you for choosing [place] at [facility] to provide your oncology and hematology care.  To afford each patient quality time with our provider, please arrive at least 15 minutes before your scheduled appointment time.   If you have a lab appointment with the [facility] please come in thru the Main Entrance and check in at the main information desk.  You need to re-schedule your appointment should you arrive 10 or more minutes late.  We strive to give you quality time with our providers, and arriving late affects you and other patients whose appointments are after yours.  Also, if you no show three or more times for appointments you may be dismissed from the clinic at the providers discretion.     Again, thank you for choosing [facility].  Our hope is that these requests will decrease the amount of time that you wait before being seen by our physicians.       _____________________________________________________________  Should you have questions after your visit to [facility], please contact our office at [phone] and follow the prompts.  Our office hours are 8:00 a.m. and 4:30 p.m. Monday - Friday.  Please note that voicemails left after 4:00 p.m. may not be returned until the following business day.  We are closed weekends and major holidays.  You do have access to a  nurse 24-7, just call the main number to the clinic [phone] and do not press any options, hold on the line and a nurse will answer the phone.    For prescription refill requests, have your pharmacy contact our office and allow 72 hours.    Due to Covid, you will need to wear a mask upon entering the hospital. If you do not have a mask, a mask will be given to you at the Main Entrance upon arrival. For doctor visits, patients may have 1 support person age 12 or older with them. For treatment visits, patients can not have anyone with them due to social distancing guidelines and our immunocompromised population.

## 2021-04-07 NOTE — Patient Instructions (Signed)
[facility]  Discharge Instructions: Thank you for choosing [facility] to provide your oncology and hematology care.  If you have a lab appointment with the Cancer Center, please come in thru the Main Entrance and check in at the main information desk.  Wear comfortable clothing and clothing appropriate for easy access to any Portacath or PICC line.   We strive to give you quality time with your provider. You may need to reschedule your appointment if you arrive late (15 or more minutes).  Arriving late affects you and other patients whose appointments are after yours.  Also, if you miss three or more appointments without notifying the office, you may be dismissed from the clinic at the provider's discretion.      For prescription refill requests, have your pharmacy contact our office and allow 72 hours for refills to be completed.    Today you received the following chemotherapy and/or immunotherapy agents Xgeva      To help prevent nausea and vomiting after your treatment, we encourage you to take your nausea medication as directed.  BELOW ARE SYMPTOMS THAT SHOULD BE REPORTED IMMEDIATELY: *FEVER GREATER THAN 100.4 F (38 C) OR HIGHER *CHILLS OR SWEATING *NAUSEA AND VOMITING THAT IS NOT CONTROLLED WITH YOUR NAUSEA MEDICATION *UNUSUAL SHORTNESS OF BREATH *UNUSUAL BRUISING OR BLEEDING *URINARY PROBLEMS (pain or burning when urinating, or frequent urination) *BOWEL PROBLEMS (unusual diarrhea, constipation, pain near the anus) TENDERNESS IN MOUTH AND THROAT WITH OR WITHOUT PRESENCE OF ULCERS (sore throat, sores in mouth, or a toothache) UNUSUAL RASH, SWELLING OR PAIN  UNUSUAL VAGINAL DISCHARGE OR ITCHING   Items with * indicate a potential emergency and should be followed up as soon as possible or go to the Emergency Department if any problems should occur.  Please show the CHEMOTHERAPY ALERT CARD or IMMUNOTHERAPY ALERT CARD at check-in to the Emergency Department  and triage nurse.  Should you have questions after your visit or need to cancel or reschedule your appointment, please contact [facility] [phone]  and follow the prompts.  Office hours are 8:00 a.m. to 4:30 p.m. Monday - Friday. Please note that voicemails left after 4:00 p.m. may not be returned until the following business day.  We are closed weekends and major holidays. You have access to a nurse at all times for urgent questions. Please call the main number to the clinic [phone] and follow the prompts.  For any non-urgent questions, you may also contact your provider using MyChart. We now offer e-Visits for anyone 18 and older to request care online for non-urgent symptoms. For details visit [email or web address].   Also download the MyChart app! Go to the app store, search "MyChart", open the app, select [facility], and log in with your MyChart username and password.  Due to Covid, a mask is required upon entering the hospital/clinic. If you do not have a mask, one will be given to you upon arrival. For doctor visits, patients may have 1 support person aged 18 or older with them. For treatment visits, patients cannot have anyone with them due to current Covid guidelines and our immunocompromised population.  

## 2021-04-07 NOTE — Progress Notes (Signed)
CRITICAL VALUE ALERT Critical value received:  WBC 1.4 and ANC 0.4 Date of notification:  04-07-2021 Time of notification: 14:06 pm Critical value read back:  Yes.   Nurse who received alert:  B. Mohd Clemons RN MD notified time and response:  Dr. Delton Coombes.

## 2021-04-08 LAB — KAPPA/LAMBDA LIGHT CHAINS
Kappa free light chain: 29.5 mg/L — ABNORMAL HIGH (ref 3.3–19.4)
Kappa, lambda light chain ratio: 2.19 — ABNORMAL HIGH (ref 0.26–1.65)
Lambda free light chains: 13.5 mg/L (ref 5.7–26.3)

## 2021-04-09 LAB — PROTEIN ELECTROPHORESIS, SERUM
A/G Ratio: 1.3 (ref 0.7–1.7)
Albumin ELP: 3.2 g/dL (ref 2.9–4.4)
Alpha-1-Globulin: 0.3 g/dL (ref 0.0–0.4)
Alpha-2-Globulin: 0.8 g/dL (ref 0.4–1.0)
Beta Globulin: 1 g/dL (ref 0.7–1.3)
Gamma Globulin: 0.3 g/dL — ABNORMAL LOW (ref 0.4–1.8)
Globulin, Total: 2.4 g/dL (ref 2.2–3.9)
Total Protein ELP: 5.6 g/dL — ABNORMAL LOW (ref 6.0–8.5)

## 2021-04-10 LAB — COPPER, SERUM: Copper: 129 ug/dL (ref 80–158)

## 2021-04-28 ENCOUNTER — Other Ambulatory Visit (HOSPITAL_COMMUNITY): Payer: Self-pay

## 2021-05-05 ENCOUNTER — Inpatient Hospital Stay (HOSPITAL_COMMUNITY): Payer: Medicare Other

## 2021-05-05 ENCOUNTER — Inpatient Hospital Stay (HOSPITAL_COMMUNITY): Payer: Medicare Other | Attending: Hematology | Admitting: Hematology

## 2021-05-05 ENCOUNTER — Other Ambulatory Visit (HOSPITAL_COMMUNITY): Payer: Self-pay

## 2021-05-05 ENCOUNTER — Other Ambulatory Visit: Payer: Self-pay

## 2021-05-05 VITALS — BP 126/59 | HR 82 | Temp 96.7°F | Resp 17 | Ht 60.5 in | Wt 139.9 lb

## 2021-05-05 DIAGNOSIS — C9000 Multiple myeloma not having achieved remission: Secondary | ICD-10-CM

## 2021-05-05 DIAGNOSIS — D649 Anemia, unspecified: Secondary | ICD-10-CM | POA: Diagnosis not present

## 2021-05-05 DIAGNOSIS — E876 Hypokalemia: Secondary | ICD-10-CM | POA: Insufficient documentation

## 2021-05-05 LAB — COMPREHENSIVE METABOLIC PANEL
ALT: 9 U/L (ref 0–44)
AST: 13 U/L — ABNORMAL LOW (ref 15–41)
Albumin: 3.8 g/dL (ref 3.5–5.0)
Alkaline Phosphatase: 65 U/L (ref 38–126)
Anion gap: 10 (ref 5–15)
BUN: 15 mg/dL (ref 8–23)
CO2: 28 mmol/L (ref 22–32)
Calcium: 9.5 mg/dL (ref 8.9–10.3)
Chloride: 98 mmol/L (ref 98–111)
Creatinine, Ser: 0.71 mg/dL (ref 0.44–1.00)
GFR, Estimated: >60 mL/min (ref >60)
Glucose, Bld: 99 mg/dL (ref 70–99)
Potassium: 3.6 mmol/L (ref 3.5–5.1)
Sodium: 136 mmol/L (ref 135–145)
Total Bilirubin: 0.5 mg/dL (ref 0.3–1.2)
Total Protein: 6.6 g/dL (ref 6.5–8.1)

## 2021-05-05 LAB — CBC WITH DIFFERENTIAL/PLATELET
Abs Immature Granulocytes: 0.01 10*3/uL (ref 0.00–0.07)
Basophils Absolute: 0 10*3/uL (ref 0.0–0.1)
Basophils Relative: 0 %
Eosinophils Absolute: 0.1 10*3/uL (ref 0.0–0.5)
Eosinophils Relative: 3 %
HCT: 29.5 % — ABNORMAL LOW (ref 36.0–46.0)
Hemoglobin: 9.1 g/dL — ABNORMAL LOW (ref 12.0–15.0)
Immature Granulocytes: 0 %
Lymphocytes Relative: 43 %
Lymphs Abs: 1.3 10*3/uL (ref 0.7–4.0)
MCH: 31 pg (ref 26.0–34.0)
MCHC: 30.8 g/dL (ref 30.0–36.0)
MCV: 100.3 fL — ABNORMAL HIGH (ref 80.0–100.0)
Monocytes Absolute: 0.4 10*3/uL (ref 0.1–1.0)
Monocytes Relative: 12 %
Neutro Abs: 1.2 10*3/uL — ABNORMAL LOW (ref 1.7–7.7)
Neutrophils Relative %: 42 %
Platelets: 137 10*3/uL — ABNORMAL LOW (ref 150–400)
RBC: 2.94 MIL/uL — ABNORMAL LOW (ref 3.87–5.11)
RDW: 16.7 % — ABNORMAL HIGH (ref 11.5–15.5)
WBC: 3 10*3/uL — ABNORMAL LOW (ref 4.0–10.5)
nRBC: 0 % (ref 0.0–0.2)

## 2021-05-05 MED ORDER — DENOSUMAB 120 MG/1.7ML ~~LOC~~ SOLN
120.0000 mg | Freq: Once | SUBCUTANEOUS | Status: AC
  Administered 2021-05-05: 120 mg via SUBCUTANEOUS
  Filled 2021-05-05: qty 1.7

## 2021-05-05 MED ORDER — LENALIDOMIDE 10 MG PO CAPS: ORAL_CAPSULE | ORAL | 0 refills | Status: DC

## 2021-05-05 NOTE — Progress Notes (Signed)
Verbal order received from A. Ouida Sills RN / Dr. Delton Coombes okay to proceed with Xgeva injection. Calcium 9.5. Vital signs stable. Patient takes Cholecalciferol (Vitamin D) 125 mcg (5000 UT) capsule daily. Patient has no complaints of jaw pain or upcoming major dental work or mouth surgeries.  Courtney Arnold presents today for injection per the provider's orders.  Xgeva administration without incident; injection site WNL; see MAR for injection details.  Patient tolerated procedure well and without incident.  No questions or complaints noted at this time. Treatment given today per MD orders. Tolerated infusion without adverse affects. Discharged from clinic ambulatory in stable condition. Alert and oriented x 3. F/U with [facility] as scheduled.

## 2021-05-05 NOTE — Patient Instructions (Signed)
[facility]  Discharge Instructions: Thank you for choosing [facility] to provide your oncology and hematology care.  If you have a lab appointment with the [facility], please come in thru the Main Entrance and check in at the main information desk.  Wear comfortable clothing and clothing appropriate for easy access to any Portacath or PICC line.   We strive to give you quality time with your provider. You may need to reschedule your appointment if you arrive late (15 or more minutes).  Arriving late affects you and other patients whose appointments are after yours.  Also, if you miss three or more appointments without notifying the office, you may be dismissed from the clinic at the providers discretion.      For prescription refill requests, have your pharmacy contact our office and allow 72 hours for refills to be completed.    Today you received the following : Xgeva injection.       To help prevent nausea and vomiting after your treatment, we encourage you to take your nausea medication as directed.  BELOW ARE SYMPTOMS THAT SHOULD BE REPORTED IMMEDIATELY: *FEVER GREATER THAN 100.4 F (38 C) OR HIGHER *CHILLS OR SWEATING *NAUSEA AND VOMITING THAT IS NOT CONTROLLED WITH YOUR NAUSEA MEDICATION *UNUSUAL SHORTNESS OF BREATH *UNUSUAL BRUISING OR BLEEDING *URINARY PROBLEMS (pain or burning when urinating, or frequent urination) *BOWEL PROBLEMS (unusual diarrhea, constipation, pain near the anus) TENDERNESS IN MOUTH AND THROAT WITH OR WITHOUT PRESENCE OF ULCERS (sore throat, sores in mouth, or a toothache) UNUSUAL RASH, SWELLING OR PAIN  UNUSUAL VAGINAL DISCHARGE OR ITCHING   Items with * indicate a potential emergency and should be followed up as soon as possible or go to the Emergency Department if any problems should occur.  Please show the CHEMOTHERAPY ALERT CARD or IMMUNOTHERAPY ALERT CARD at check-in to the Emergency Department and triage  nurse.  Should you have questions after your visit or need to cancel or reschedule your appointment, please contact [facility] [phone]  and follow the prompts.  Office hours are 8:00 a.m. to 4:30 p.m. Monday - Friday. Please note that voicemails left after 4:00 p.m. may not be returned until the following business day.  We are closed weekends and major holidays. You have access to a nurse at all times for urgent questions. Please call the main number to the clinic [phone] and follow the prompts.  For any non-urgent questions, you may also contact your provider using MyChart. We now offer e-Visits for anyone 80 and older to request care online for non-urgent symptoms. For details visit [email or web address].   Also download the MyChart app! Go to the app store, search "MyChart", open the app, select [place], and log in with your MyChart username and password.  Due to Covid, a mask is required upon entering the hospital/clinic. If you do not have a mask, one will be given to you upon arrival. For doctor visits, patients may have 1 support person aged 80 or older with them. For treatment visits, patients cannot have anyone with them due to current Covid guidelines and our immunocompromised population.

## 2021-05-05 NOTE — Patient Instructions (Signed)
[place] at [facility] Discharge Instructions   You were seen and examined today by Dr. Delton Coombes.  He reviewed your lab results with you.  They have improved.  You may restart Revlimid 10 mg 2 weeks on 1 week off.   You will receive Xgeva injection today.  Return as scheduled for lab work and injections.  Return as scheduled for office visit.    Thank you for choosing [place] at [facility] to provide your oncology and hematology care.  To afford each patient quality time with our provider, please arrive at least 15 minutes before your scheduled appointment time.   If you have a lab appointment with the [facility] please come in thru the Main Entrance and check in at the main information desk.  You need to re-schedule your appointment should you arrive 10 or more minutes late.  We strive to give you quality time with our providers, and arriving late affects you and other patients whose appointments are after yours.  Also, if you no show three or more times for appointments you may be dismissed from the clinic at the providers discretion.     Again, thank you for choosing [facility].  Our hope is that these requests will decrease the amount of time that you wait before being seen by our physicians.       _____________________________________________________________  Should you have questions after your visit to [facility], please contact our office at [phone] and follow the prompts.  Our office hours are 8:00 a.m. and 4:30 p.m. Monday - Friday.  Please note that voicemails left after 4:00 p.m. may not be returned until the following business day.  We are closed weekends and major holidays.  You do have access to a nurse 24-7, just call the main number to the clinic [phone] and do not press any options, hold on the line and a nurse will answer the phone.    For prescription refill requests, have  your pharmacy contact our office and allow 72 hours.    Due to Covid, you will need to wear a mask upon entering the hospital. If you do not have a mask, a mask will be given to you at the Main Entrance upon arrival. For doctor visits, patients may have 1 support person age 80 or older with them. For treatment visits, patients can not have anyone with them due to social distancing guidelines and our immunocompromised population.

## 2021-05-05 NOTE — Telephone Encounter (Signed)
Chart reviewed. Revlimid refilled per last office note with Dr. Katragadda.  

## 2021-05-05 NOTE — Progress Notes (Signed)
[facility] [place], [place] [address]   CLINIC:  Medical Oncology/Hematology  PCP:  [name], MD [address] 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  PRIOR THERAPY: Velcade x 9 cycles from 07/04/2019 to 01/10/2020  NGS Results: not done  CURRENT THERAPY: Revlimid 15 mg 2/3 weeks; Xgeva monthly  BRIEF ONCOLOGIC HISTORY:  Oncology History  Multiple myeloma not having achieved remission ([place])  06/07/2019 Initial Diagnosis   Multiple myeloma not having achieved remission ([place])   07/04/2019 - 01/10/2020 Chemotherapy   The patient had dexamethasone (DECADRON) 4 MG tablet, 1 of 1 cycle, Start date: 01/10/2020, End date: -- lenalidomide (REVLIMID) 15 MG capsule, 1 of 1 cycle, Start date: 02/26/2020, End date: -- bortezomib SQ (VELCADE) chemo injection 2.25 mg, 1.3 mg/m2 = 2.25 mg, Subcutaneous,  Once, 9 of 10 cycles Administration: 2.25 mg (07/04/2019), 2.25 mg (07/11/2019), 2.25 mg (07/26/2019), 2.25 mg (08/02/2019), 2.25 mg (08/09/2019), 2.25 mg (08/16/2019), 2.25 mg (08/23/2019), 2.25 mg (08/30/2019), 2.25 mg (09/06/2019), 2.25 mg (09/13/2019), 2.25 mg (09/20/2019), 2.25 mg (09/27/2019), 2.25 mg (10/04/2019), 2.25 mg (10/11/2019), 2.25 mg (10/18/2019), 2.25 mg (10/25/2019), 2.25 mg (11/01/2019), 2.25 mg (11/08/2019), 2.25 mg (11/15/2019), 2.25 mg (11/22/2019), 2.25 mg (11/29/2019), 2.25 mg (12/06/2019), 2.25 mg (12/13/2019), 2.25 mg (12/20/2019), 2.25 mg (12/27/2019), 2.25 mg (01/03/2020), 2.25 mg (01/10/2020)   for chemotherapy treatment.       CANCER STAGING:  Cancer Staging  No matching staging information was found for the patient.  INTERVAL HISTORY:  [name], a 81 y.o. female, returns for routine follow-up of her IgA kappa plasma cell myeloma. Courtney Arnold was last seen on 04/07/2021.   Today she reports feeling good. She has stopped Revlimid, and she has continued dexamethasone. She continues  to take 81 mg aspirin.   REVIEW OF SYSTEMS:  Review of Systems  Constitutional:  Negative for appetite change and fatigue.  All other systems reviewed and are negative.  PAST MEDICAL/SURGICAL HISTORY:  Past Medical History:  Diagnosis Date   Atrial fibrillation ([place]) 2011   Postop, spontaneous conversion to normal sinus after one hour   Compression fracture 07/24/09   T12; kyphoplasty   History of echocardiogram 5/11   EF 65%   Hypertension    Thyroid disease    Tobacco abuse    Past Surgical History:  Procedure Laterality Date   BACK SURGERY     BACK SURGERY  06/06/2015   BREAST EXCISIONAL BIOPSY Left    50 years ago  benign   CHOLECYSTECTOMY N/A 04/25/2015   Procedure: LAPAROSCOPIC CHOLECYSTECTOMY WITH INTRAOPERATIVE CHOLANGIOGRAM;  Surgeon: Mickeal Skinner, MD;  Location: [facility] OR[facility];  Service: General;  Laterality: N/A;   COLONOSCOPY N/A 11/26/2015   Procedure: COLONOSCOPY;  Surgeon: Daneil Dolin, MD;  Location: [facility];  Service: Endoscopy;  Laterality: N/A;  7:30 am   ERCP N/A 04/14/2015   Procedure: ENDOSCOPIC RETROGRADE CHOLANGIOPANCREATOGRAPHY (ERCP) Biliary Sphincterotomy, 10x7 stent placement Dilated bilary system just not well seen;  Surgeon: Rogene Houston, MD;  Location: [facility]S;  Service: Endoscopy;  Laterality: N/A;   ERCP N/A 06/12/2015   Procedure: ENDOSCOPIC RETROGRADE CHOLANGIOPANCREATOGRAPHY (ERCP);  Surgeon: Rogene Houston, MD;  Location: [facility];  Service: Endoscopy;  Laterality: N/A;   ESOPHAGOGASTRODUODENOSCOPY N/A 06/12/2015   Procedure: DIAGNOSTIC ESOPHAGOGASTRODUODENOSCOPY (EGD);  Surgeon: Rogene Houston, MD;  [facility]: [facility];  Service: Endoscopy;  Laterality: N/A;   FEMUR  IM NAIL Right 05/11/2019   Procedure: RETROGRADE INTRAMEDULLARY NAIL FEMORAL;  Surgeon: Shona Needles, MD;  Location: [facility];  Service: Orthopedics;  Laterality: Right;   STENT REMOVAL  06/12/2015   Procedure: STENT REMOVAL ;  Surgeon: Rogene Houston, MD;  Location: [facility]  END[facility];  Service: Endoscopy;;    SOCIAL HISTORY:  Social History   Socioeconomic History   Marital status: Widowed    Spouse name: Not on file   Number of children: 2   Years of education: Not on file   Highest education level: Not on file  Occupational History   Occupation: retired  Tobacco Use   Smoking status: Former    Packs/day: 0.15    Years: 20.00    Pack years: 3.00    Types: Cigarettes    Quit date: 03/08/2013    Years since quitting: 8.1   Smokeless tobacco: Never  Vaping Use   Vaping Use: Never used  Substance and Sexual Activity   Alcohol use: No    Alcohol/week: 0.0 standard drinks   Drug use: No   Sexual activity: Not Currently  Other Topics Concern   Not on file  Social History Narrative   Active in gardens and does yard work.   Social Determinants of Health   Financial Resource Strain: Not on file  Food Insecurity: Not on file  Transportation Needs: Not on file  Physical Activity: Not on file  Stress: Not on file  Social Connections: Not on file  Intimate Partner Violence: Not on file    FAMILY HISTORY:  Family History  Problem Relation Age of Onset   Heart attack Father    Stroke Father    Breast cancer Sister    Thyroid disease Neg Hx    Colon cancer Neg Hx     CURRENT MEDICATIONS:  Current Outpatient Medications  Medication Sig Dispense Refill   acyclovir (ZOVIRAX) 400 MG tablet TAKE (1) TABLET BY MOUTH TWICE DAILY. 60 tablet 3   aspirin EC 81 MG tablet Take 81 mg by mouth daily.     carboxymethylcellulose (REFRESH PLUS) 0.5 % SOLN Place 1 drop into both eyes in the morning, at noon, in the evening, and at bedtime.     Cholecalciferol (VITAMIN D) 125 MCG (5000 UT) CAPS Take 5,000 Units by mouth daily. 90 capsule 1   dexamethasone (DECADRON) 4 MG tablet TAKE 2 TABLETS BY MOUTH once weekly 30 tablet 3   lenalidomide (REVLIMID) 10 MG capsule Take one capsule daily on days 1-14 every 21 days. 14 capsule 5   levothyroxine (SYNTHROID)  137 MCG tablet Take 1 tablet (137 mcg total) by mouth daily before breakfast. 90 tablet 3   prednisoLONE acetate (PRED FORTE) 1 % ophthalmic suspension Place 1 drop into the right eye 4 (four) times daily.     No current facility-administered medications for this visit.    ALLERGIES:  No Known Allergies  PHYSICAL EXAM:  Performance status (ECOG): 1 - Symptomatic but completely ambulatory  Vitals:   05/05/21 1255  BP: (!) 126/59  Pulse: 82  Resp: 17  Temp: (!) 96.7 F (35.9 C)  SpO2: 97%   Wt Readings from Last 3 Encounters:  05/05/21 139 lb 14.4 oz (63.5 kg)  04/07/21 143 lb 6.4 oz (65 kg)  02/17/21 144 lb 9.6 oz (65.6 kg)   Physical Exam Vitals reviewed.  Constitutional:      Appearance: Normal appearance.  Cardiovascular:     Rate and Rhythm: Normal rate and regular  rhythm.     Pulses: Normal pulses.     Heart sounds: Normal heart sounds.  Pulmonary:     Effort: Pulmonary effort is normal.     Breath sounds: Normal breath sounds.  Neurological:     General: No focal deficit present.     Mental Status: She is alert and oriented to person, place, and time.  Psychiatric:        Mood and Affect: Mood normal.        Behavior: Behavior normal.     LABORATORY DATA:  I have reviewed the labs as listed.  CBC Latest Ref Rng & Units 05/05/2021 04/07/2021 02/09/2021  WBC 4.0 - 10.5 K/uL 3.0(L) 1.4(LL) 2.5(L)  Hemoglobin 12.0 - 15.0 g/dL 9.1(L) 8.4(L) 9.1(L)  Hematocrit 36.0 - 46.0 % 29.5(L) 26.5(L) 28.0(L)  Platelets 150 - 400 K/uL 137(L) 95(L) 104(L)   CMP Latest Ref Rng & Units 05/05/2021 04/07/2021 03/10/2021  Glucose 70 - 99 mg/dL 99 116(H) 106(H)  BUN 8 - 23 mg/dL 15 19 17   Creatinine 0.44 - 1.00 mg/dL 0.71 0.75 0.80  Sodium 135 - 145 mmol/L 136 138 136  Potassium 3.5 - 5.1 mmol/L 3.6 3.4(L) 3.2(L)  Chloride 98 - 111 mmol/L 98 100 98  CO2 22 - 32 mmol/L 28 31 28   Calcium 8.9 - 10.3 mg/dL 9.5 9.1 7.8(L)  Total Protein 6.5 - 8.1 g/dL 6.6 6.3(L) 6.3(L)  Total Bilirubin  0.3 - 1.2 mg/dL 0.5 0.4 0.2(L)  Alkaline Phos 38 - 126 U/L 65 94 87  AST 15 - 41 U/L 13(L) 18 20  ALT 0 - 44 U/L 9 23 21     DIAGNOSTIC IMAGING:  I have independently reviewed the scans and discussed with the patient. No results found.   ASSESSMENT:  1.  IgA kappa plasma cell myeloma: -IgA kappa plasma cell myeloma diagnosed on right sacral bone biopsy on 05/17/2019.  SPEP did not show M spike.  Immunofixation shows IgA kappa.  Kappa light chains 42.8, lambda light chains at 9.9, ratio 4.32.  Beta-2 microglobulin 3.7. -PET scan on 06/04/2019 showed expansile lytic lesion involving the right sacral wing.  Lytic lesion involving T10 vertebral body.  Left seventh rib lesion.  Increased uptake in a pathological fracture involving distal aspect of the right femur. 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 -RVD started on 06/26/2019.  Revlimid 15 mg 2 weeks on/1 week off. -Velcade discontinued on 01/10/2020. -She is taking Revlimid 15 mg 2 weeks on 1 week off, with dexamethasone 20 mg on weeks of Revlimid and 10 mg while she is off Revlimid.   PLAN:  1.  IgA kappa plasma cell myeloma: -We have reviewed myeloma labs from 04/07/2021.  M spike was 0.  Light chain ratio was 2.19 and stable.  Kappa light chains are 29.5 and stable. - We held her Revlimid on 04/07/2021 she developed severe neutropenia with ANC 0.4 and white count 1.4.  She also had thrombocytopenia of 95. - We have repeated her blood counts today.  White count is 3.0 with ANC 1.2.  Platelet count improved to 137.  Hemoglobin also slightly improved to 9.1. - Recommend starting Revlimid at a lower dose 10 mg 2 weeks on/1 week off. - We will plan to repeat CBC in 4 weeks. - RTC 8 weeks for follow-up with repeat labs.   2.  Hypokalemia: -Continue OTC potassium daily.  Potassium today is 3.6.   3.  Normocytic anemia: -Last Feraheme was in September 2022. - Latest ferritin was  297 with percent saturation 15.  Q06, folic acid and copper were normal. - She has macrocytic anemia from myelosuppression.   4.  ID prophylaxis: -Continue acyclovir twice daily. - Continue aspirin 81 mg for thromboprophylaxis.   5.  Myeloma bone disease: -Continue calcium tablets daily.  Continue denosumab monthly.  Calcium is normal.   Orders placed this encounter:  No orders of the defined types were placed in this encounter.    Derek Jack, MD [place] [phone]   I, Thana Ates, am acting as a scribe for Dr. Derek Jack.  I, Derek Jack MD, have reviewed the above documentation for accuracy and completeness, and I agree with the above.

## 2021-05-07 ENCOUNTER — Other Ambulatory Visit (HOSPITAL_COMMUNITY): Payer: Self-pay | Admitting: Hematology

## 2021-05-07 DIAGNOSIS — C9000 Multiple myeloma not having achieved remission: Secondary | ICD-10-CM

## 2021-05-28 ENCOUNTER — Other Ambulatory Visit (HOSPITAL_COMMUNITY): Payer: Self-pay

## 2021-05-28 DIAGNOSIS — M79674 Pain in right toe(s): Secondary | ICD-10-CM | POA: Diagnosis not present

## 2021-05-28 DIAGNOSIS — C9000 Multiple myeloma not having achieved remission: Secondary | ICD-10-CM

## 2021-05-28 DIAGNOSIS — L03031 Cellulitis of right toe: Secondary | ICD-10-CM | POA: Diagnosis not present

## 2021-05-28 DIAGNOSIS — M79671 Pain in right foot: Secondary | ICD-10-CM | POA: Diagnosis not present

## 2021-05-28 DIAGNOSIS — L600 Ingrowing nail: Secondary | ICD-10-CM | POA: Diagnosis not present

## 2021-05-28 MED ORDER — LENALIDOMIDE 10 MG PO CAPS: ORAL_CAPSULE | ORAL | 0 refills | Status: DC

## 2021-05-28 NOTE — Telephone Encounter (Signed)
Chart reviewed. Revlimid refilled per last office note with Dr. Katragadda.  

## 2021-06-02 ENCOUNTER — Inpatient Hospital Stay (HOSPITAL_COMMUNITY): Payer: Medicare Other

## 2021-06-02 ENCOUNTER — Encounter (HOSPITAL_COMMUNITY): Payer: Self-pay

## 2021-06-02 ENCOUNTER — Ambulatory Visit (HOSPITAL_COMMUNITY): Payer: Medicare Other | Admitting: Hematology

## 2021-06-02 ENCOUNTER — Other Ambulatory Visit: Payer: Self-pay

## 2021-06-02 ENCOUNTER — Other Ambulatory Visit (HOSPITAL_COMMUNITY): Payer: Medicare Other

## 2021-06-02 ENCOUNTER — Inpatient Hospital Stay (HOSPITAL_COMMUNITY): Payer: Medicare Other | Attending: Hematology

## 2021-06-02 VITALS — BP 121/55 | HR 82 | Temp 98.2°F | Resp 18

## 2021-06-02 DIAGNOSIS — C9000 Multiple myeloma not having achieved remission: Secondary | ICD-10-CM | POA: Insufficient documentation

## 2021-06-02 LAB — CBC WITH DIFFERENTIAL/PLATELET
Abs Immature Granulocytes: 0.01 10*3/uL (ref 0.00–0.07)
Basophils Absolute: 0 10*3/uL (ref 0.0–0.1)
Basophils Relative: 1 %
Eosinophils Absolute: 0.1 10*3/uL (ref 0.0–0.5)
Eosinophils Relative: 2 %
HCT: 27.3 % — ABNORMAL LOW (ref 36.0–46.0)
Hemoglobin: 8.3 g/dL — ABNORMAL LOW (ref 12.0–15.0)
Immature Granulocytes: 1 %
Lymphocytes Relative: 41 %
Lymphs Abs: 0.9 10*3/uL (ref 0.7–4.0)
MCH: 30.2 pg (ref 26.0–34.0)
MCHC: 30.4 g/dL (ref 30.0–36.0)
MCV: 99.3 fL (ref 80.0–100.0)
Monocytes Absolute: 0.1 10*3/uL (ref 0.1–1.0)
Monocytes Relative: 3 %
Neutro Abs: 1.1 10*3/uL — ABNORMAL LOW (ref 1.7–7.7)
Neutrophils Relative %: 52 %
Platelets: 132 10*3/uL — ABNORMAL LOW (ref 150–400)
RBC: 2.75 MIL/uL — ABNORMAL LOW (ref 3.87–5.11)
RDW: 17.6 % — ABNORMAL HIGH (ref 11.5–15.5)
WBC: 2.1 10*3/uL — ABNORMAL LOW (ref 4.0–10.5)
nRBC: 0 % (ref 0.0–0.2)

## 2021-06-02 LAB — COMPREHENSIVE METABOLIC PANEL
ALT: 12 U/L (ref 0–44)
AST: 14 U/L — ABNORMAL LOW (ref 15–41)
Albumin: 3.7 g/dL (ref 3.5–5.0)
Alkaline Phosphatase: 80 U/L (ref 38–126)
Anion gap: 11 (ref 5–15)
BUN: 15 mg/dL (ref 8–23)
CO2: 30 mmol/L (ref 22–32)
Calcium: 9.2 mg/dL (ref 8.9–10.3)
Chloride: 98 mmol/L (ref 98–111)
Creatinine, Ser: 0.87 mg/dL (ref 0.44–1.00)
GFR, Estimated: >60 mL/min (ref >60)
Glucose, Bld: 105 mg/dL — ABNORMAL HIGH (ref 70–99)
Potassium: 3.6 mmol/L (ref 3.5–5.1)
Sodium: 139 mmol/L (ref 135–145)
Total Bilirubin: 0.6 mg/dL (ref 0.3–1.2)
Total Protein: 6.3 g/dL — ABNORMAL LOW (ref 6.5–8.1)

## 2021-06-02 MED ORDER — DENOSUMAB 120 MG/1.7ML ~~LOC~~ SOLN
120.0000 mg | Freq: Once | SUBCUTANEOUS | Status: AC
  Administered 2021-06-02: 120 mg via SUBCUTANEOUS
  Filled 2021-06-02: qty 1.7

## 2021-06-02 NOTE — Patient Instructions (Signed)
[facility]  Discharge Instructions: Thank you for choosing [facility] to provide your oncology and hematology care.  If you have a lab appointment with the Cancer Center, please come in thru the Main Entrance and check in at the main information desk.  Wear comfortable clothing and clothing appropriate for easy access to any Portacath or PICC line.   We strive to give you quality time with your provider. You may need to reschedule your appointment if you arrive late (15 or more minutes).  Arriving late affects you and other patients whose appointments are after yours.  Also, if you miss three or more appointments without notifying the office, you may be dismissed from the clinic at the provider's discretion.      For prescription refill requests, have your pharmacy contact our office and allow 72 hours for refills to be completed.        To help prevent nausea and vomiting after your treatment, we encourage you to take your nausea medication as directed.  BELOW ARE SYMPTOMS THAT SHOULD BE REPORTED IMMEDIATELY: *FEVER GREATER THAN 100.4 F (38 C) OR HIGHER *CHILLS OR SWEATING *NAUSEA AND VOMITING THAT IS NOT CONTROLLED WITH YOUR NAUSEA MEDICATION *UNUSUAL SHORTNESS OF BREATH *UNUSUAL BRUISING OR BLEEDING *URINARY PROBLEMS (pain or burning when urinating, or frequent urination) *BOWEL PROBLEMS (unusual diarrhea, constipation, pain near the anus) TENDERNESS IN MOUTH AND THROAT WITH OR WITHOUT PRESENCE OF ULCERS (sore throat, sores in mouth, or a toothache) UNUSUAL RASH, SWELLING OR PAIN  UNUSUAL VAGINAL DISCHARGE OR ITCHING   Items with * indicate a potential emergency and should be followed up as soon as possible or go to the Emergency Department if any problems should occur.  Please show the CHEMOTHERAPY ALERT CARD or IMMUNOTHERAPY ALERT CARD at check-in to the Emergency Department and triage nurse.  Should you have questions after your visit or need to cancel  or reschedule your appointment, please contact [facility] [phone]  and follow the prompts.  Office hours are 8:00 a.m. to 4:30 p.m. Monday - Friday. Please note that voicemails left after 4:00 p.m. may not be returned until the following business day.  We are closed weekends and major holidays. You have access to a nurse at all times for urgent questions. Please call the main number to the clinic [phone] and follow the prompts.  For any non-urgent questions, you may also contact your provider using MyChart. We now offer e-Visits for anyone 18 and older to request care online for non-urgent symptoms. For details visit [email or web address].   Also download the MyChart app! Go to the app store, search "MyChart", open the app, select [facility], and log in with your MyChart username and password.  Due to Covid, a mask is required upon entering the hospital/clinic. If you do not have a mask, one will be given to you upon arrival. For doctor visits, patients may have 1 support person aged 18 or older with them. For treatment visits, patients cannot have anyone with them due to current Covid guidelines and our immunocompromised population.  

## 2021-06-03 DIAGNOSIS — M79674 Pain in right toe(s): Secondary | ICD-10-CM | POA: Diagnosis not present

## 2021-06-03 DIAGNOSIS — Z0389 Encounter for observation for other suspected diseases and conditions ruled out: Secondary | ICD-10-CM | POA: Diagnosis not present

## 2021-06-10 DIAGNOSIS — M79671 Pain in right foot: Secondary | ICD-10-CM | POA: Diagnosis not present

## 2021-06-10 DIAGNOSIS — M79674 Pain in right toe(s): Secondary | ICD-10-CM | POA: Diagnosis not present

## 2021-06-10 DIAGNOSIS — L600 Ingrowing nail: Secondary | ICD-10-CM | POA: Diagnosis not present

## 2021-06-10 DIAGNOSIS — L03031 Cellulitis of right toe: Secondary | ICD-10-CM | POA: Diagnosis not present

## 2021-06-19 ENCOUNTER — Other Ambulatory Visit (HOSPITAL_COMMUNITY): Payer: Self-pay

## 2021-06-19 DIAGNOSIS — C9000 Multiple myeloma not having achieved remission: Secondary | ICD-10-CM

## 2021-06-19 MED ORDER — LENALIDOMIDE 10 MG PO CAPS: ORAL_CAPSULE | ORAL | 0 refills | Status: DC

## 2021-06-19 NOTE — Telephone Encounter (Signed)
Chart reviewed. Revlimid refilled per last office note with Dr. Katragadda.  

## 2021-06-23 ENCOUNTER — Inpatient Hospital Stay (HOSPITAL_COMMUNITY): Payer: Medicare Other | Attending: Hematology

## 2021-06-23 DIAGNOSIS — E876 Hypokalemia: Secondary | ICD-10-CM | POA: Diagnosis not present

## 2021-06-23 DIAGNOSIS — C9000 Multiple myeloma not having achieved remission: Secondary | ICD-10-CM | POA: Insufficient documentation

## 2021-06-23 DIAGNOSIS — D649 Anemia, unspecified: Secondary | ICD-10-CM | POA: Diagnosis not present

## 2021-06-23 LAB — CBC WITH DIFFERENTIAL/PLATELET
Abs Immature Granulocytes: 0.03 10*3/uL (ref 0.00–0.07)
Basophils Absolute: 0 10*3/uL (ref 0.0–0.1)
Basophils Relative: 1 %
Eosinophils Absolute: 0.1 10*3/uL (ref 0.0–0.5)
Eosinophils Relative: 5 %
HCT: 26.8 % — ABNORMAL LOW (ref 36.0–46.0)
Hemoglobin: 8 g/dL — ABNORMAL LOW (ref 12.0–15.0)
Immature Granulocytes: 2 %
Lymphocytes Relative: 35 %
Lymphs Abs: 0.7 10*3/uL (ref 0.7–4.0)
MCH: 28.7 pg (ref 26.0–34.0)
MCHC: 29.9 g/dL — ABNORMAL LOW (ref 30.0–36.0)
MCV: 96.1 fL (ref 80.0–100.0)
Monocytes Absolute: 0.1 10*3/uL (ref 0.1–1.0)
Monocytes Relative: 4 %
Neutro Abs: 1.1 10*3/uL — ABNORMAL LOW (ref 1.7–7.7)
Neutrophils Relative %: 53 %
Platelets: 127 10*3/uL — ABNORMAL LOW (ref 150–400)
RBC: 2.79 MIL/uL — ABNORMAL LOW (ref 3.87–5.11)
RDW: 17.6 % — ABNORMAL HIGH (ref 11.5–15.5)
WBC: 2.1 10*3/uL — ABNORMAL LOW (ref 4.0–10.5)
nRBC: 0 % (ref 0.0–0.2)

## 2021-06-23 LAB — COMPREHENSIVE METABOLIC PANEL
ALT: 16 U/L (ref 0–44)
AST: 16 U/L (ref 15–41)
Albumin: 3.4 g/dL — ABNORMAL LOW (ref 3.5–5.0)
Alkaline Phosphatase: 84 U/L (ref 38–126)
Anion gap: 7 (ref 5–15)
BUN: 20 mg/dL (ref 8–23)
CO2: 29 mmol/L (ref 22–32)
Calcium: 8.7 mg/dL — ABNORMAL LOW (ref 8.9–10.3)
Chloride: 100 mmol/L (ref 98–111)
Creatinine, Ser: 0.83 mg/dL (ref 0.44–1.00)
GFR, Estimated: >60 mL/min (ref >60)
Glucose, Bld: 118 mg/dL — ABNORMAL HIGH (ref 70–99)
Potassium: 3.5 mmol/L (ref 3.5–5.1)
Sodium: 136 mmol/L (ref 135–145)
Total Bilirubin: 0.4 mg/dL (ref 0.3–1.2)
Total Protein: 6.3 g/dL — ABNORMAL LOW (ref 6.5–8.1)

## 2021-06-23 LAB — LACTATE DEHYDROGENASE: LDH: 102 U/L (ref 98–192)

## 2021-06-24 DIAGNOSIS — L03031 Cellulitis of right toe: Secondary | ICD-10-CM | POA: Diagnosis not present

## 2021-06-24 DIAGNOSIS — M79674 Pain in right toe(s): Secondary | ICD-10-CM | POA: Diagnosis not present

## 2021-06-24 DIAGNOSIS — L600 Ingrowing nail: Secondary | ICD-10-CM | POA: Diagnosis not present

## 2021-06-24 DIAGNOSIS — M79671 Pain in right foot: Secondary | ICD-10-CM | POA: Diagnosis not present

## 2021-06-24 LAB — PROTEIN ELECTROPHORESIS, SERUM
A/G Ratio: 1.4 (ref 0.7–1.7)
Albumin ELP: 3.4 g/dL (ref 2.9–4.4)
Alpha-1-Globulin: 0.3 g/dL (ref 0.0–0.4)
Alpha-2-Globulin: 0.8 g/dL (ref 0.4–1.0)
Beta Globulin: 1.2 g/dL (ref 0.7–1.3)
Gamma Globulin: 0.2 g/dL — ABNORMAL LOW (ref 0.4–1.8)
Globulin, Total: 2.5 g/dL (ref 2.2–3.9)
Total Protein ELP: 5.9 g/dL — ABNORMAL LOW (ref 6.0–8.5)

## 2021-06-24 LAB — KAPPA/LAMBDA LIGHT CHAINS
Kappa free light chain: 20.6 mg/L — ABNORMAL HIGH (ref 3.3–19.4)
Kappa, lambda light chain ratio: 2.75 — ABNORMAL HIGH (ref 0.26–1.65)
Lambda free light chains: 7.5 mg/L (ref 5.7–26.3)

## 2021-06-25 LAB — IMMUNOFIXATION ELECTROPHORESIS
IgA: 258 mg/dL (ref 64–422)
IgG (Immunoglobin G), Serum: 238 mg/dL — ABNORMAL LOW (ref 586–1602)
IgM (Immunoglobulin M), Srm: 9 mg/dL — ABNORMAL LOW (ref 26–217)
Total Protein ELP: 5.6 g/dL — ABNORMAL LOW (ref 6.0–8.5)

## 2021-06-30 ENCOUNTER — Other Ambulatory Visit (HOSPITAL_COMMUNITY): Payer: Medicare Other

## 2021-06-30 ENCOUNTER — Inpatient Hospital Stay (HOSPITAL_COMMUNITY): Payer: Medicare Other

## 2021-06-30 ENCOUNTER — Inpatient Hospital Stay (HOSPITAL_BASED_OUTPATIENT_CLINIC_OR_DEPARTMENT_OTHER): Payer: Medicare Other | Admitting: Hematology

## 2021-06-30 VITALS — BP 117/61 | HR 86 | Temp 97.7°F | Resp 18 | Ht 60.5 in | Wt 142.6 lb

## 2021-06-30 DIAGNOSIS — C9000 Multiple myeloma not having achieved remission: Secondary | ICD-10-CM

## 2021-06-30 DIAGNOSIS — E876 Hypokalemia: Secondary | ICD-10-CM | POA: Diagnosis not present

## 2021-06-30 DIAGNOSIS — D649 Anemia, unspecified: Secondary | ICD-10-CM | POA: Diagnosis not present

## 2021-06-30 LAB — IRON AND TIBC
Iron: 23 ug/dL — ABNORMAL LOW (ref 28–170)
Saturation Ratios: 7 % — ABNORMAL LOW (ref 10.4–31.8)
TIBC: 348 ug/dL (ref 250–450)
UIBC: 325 ug/dL

## 2021-06-30 LAB — FOLATE: Folate: 11 ng/mL (ref >5.9)

## 2021-06-30 LAB — VITAMIN B12: Vitamin B-12: 5462 pg/mL — ABNORMAL HIGH (ref 180–914)

## 2021-06-30 LAB — FERRITIN: Ferritin: 171 ng/mL (ref 11–307)

## 2021-06-30 MED ORDER — DENOSUMAB 120 MG/1.7ML ~~LOC~~ SOLN
120.0000 mg | Freq: Once | SUBCUTANEOUS | Status: AC
  Administered 2021-06-30: 120 mg via SUBCUTANEOUS
  Filled 2021-06-30: qty 1.7

## 2021-06-30 MED ORDER — DEXAMETHASONE 4 MG PO TABS: ORAL_TABLET | ORAL | 3 refills | Status: DC

## 2021-06-30 NOTE — Progress Notes (Signed)
Patient is taking Revlimid as prescribed.  She has not missed any doses and reports no side effects at this time.   

## 2021-06-30 NOTE — Progress Notes (Signed)
Courtney Arnold presents today for Xgeva injection per the provider's orders.  Patient has been taking her Calcium and Vitamin D supplements, Has had no dental work prior or upcoming, and no jaw pain.  Stable during administration without incident; injection site WNL; see MAR for injection details.  Patient tolerated procedure well and without incident.  No questions or complaints noted at this time.  ? ?Additional labs drawn peripherally per Dr. Fransico Michael order. ?

## 2021-06-30 NOTE — Progress Notes (Signed)
? ?[facility] ?[address] ?[place], [place] [address] ? ? ?CLINIC:  ?Medical Oncology/Hematology ? ?PCP:  ?[name], MD ?[address] / [place] [address] ?[phone] ? ? ?REASON FOR VISIT:  ?Follow-up for IgA kappa plasma cell myeloma ? ?PRIOR THERAPY: Velcade x 9 cycles from 07/04/2019 to 01/10/2020 ? ?NGS Results: not done ? ?CURRENT THERAPY: Revlimid 15 mg 2/3 weeks; Xgeva monthly ? ?BRIEF ONCOLOGIC HISTORY:  ?Oncology History  ?Multiple myeloma not having achieved remission ([place])  ?06/07/2019 Initial Diagnosis  ? Multiple myeloma not having achieved remission ([place]) ? ?  ?07/04/2019 - 01/10/2020 Chemotherapy  ? The patient had dexamethasone (DECADRON) 4 MG tablet, 1 of 1 cycle, Start date: 01/10/2020, End date: -- ?lenalidomide (REVLIMID) 15 MG capsule, 1 of 1 cycle, Start date: 02/26/2020, End date: -- ?bortezomib SQ (VELCADE) chemo injection 2.25 mg, 1.3 mg/m2 = 2.25 mg, Subcutaneous,  Once, 9 of 10 cycles ?Administration: 2.25 mg (07/04/2019), 2.25 mg (07/11/2019), 2.25 mg (07/26/2019), 2.25 mg (08/02/2019), 2.25 mg (08/09/2019), 2.25 mg (08/16/2019), 2.25 mg (08/23/2019), 2.25 mg (08/30/2019), 2.25 mg (09/06/2019), 2.25 mg (09/13/2019), 2.25 mg (09/20/2019), 2.25 mg (09/27/2019), 2.25 mg (10/04/2019), 2.25 mg (10/11/2019), 2.25 mg (10/18/2019), 2.25 mg (10/25/2019), 2.25 mg (11/01/2019), 2.25 mg (11/08/2019), 2.25 mg (11/15/2019), 2.25 mg (11/22/2019), 2.25 mg (11/29/2019), 2.25 mg (12/06/2019), 2.25 mg (12/13/2019), 2.25 mg (12/20/2019), 2.25 mg (12/27/2019), 2.25 mg (01/03/2020), 2.25 mg (01/10/2020) ? ? for chemotherapy treatment.  ? ?  ? ? ?CANCER STAGING: ? Cancer Staging  ?No matching staging information was found for the patient. ? ?INTERVAL HISTORY:  ?Courtney Arnold, a 81 y.o. female, returns for routine follow-up of her IgA kappa plasma cell myeloma. Courtney was last seen on 05/05/2021.  ? ?Today she reports feeling good. She denies fatigue. She denies new pains, back pain, jaw pain, hematuria,  hematochezia, black stools, light headedness, and CP. She is taking 1 iron tablet daily.  ? ?REVIEW OF SYSTEMS:  ?Review of Systems  ?Constitutional:  Negative for appetite change and fatigue.  ?Cardiovascular:  Negative for chest pain.  ?Gastrointestinal:  Negative for blood in stool.  ?Genitourinary:  Negative for hematuria.   ?Musculoskeletal:  Negative for back pain.  ?Neurological:  Negative for light-headedness.  ?All other systems reviewed and are negative. ? ?PAST MEDICAL/SURGICAL HISTORY:  ?Past Medical History:  ?Diagnosis Date  ? Atrial fibrillation ([place]) 2011  ? Postop, spontaneous conversion to normal sinus after one hour  ? Compression fracture 07/24/09  ? T12; kyphoplasty  ? History of echocardiogram 5/11  ? EF 65%  ? Hypertension   ? Thyroid disease   ? Tobacco abuse   ? ?Past Surgical History:  ?Procedure Laterality Date  ? BACK SURGERY    ? BACK SURGERY  06/06/2015  ? BREAST EXCISIONAL BIOPSY Left   ? 50 years ago  benign  ? CHOLECYSTECTOMY N/A 04/25/2015  ? Procedure: LAPAROSCOPIC CHOLECYSTECTOMY WITH INTRAOPERATIVE CHOLANGIOGRAM;  Surgeon: Mickeal Skinner, MD;  Location: [facility];  Service: General;  Laterality: N/A;  ? COLONOSCOPY N/A 11/26/2015  ? Procedure: COLONOSCOPY;  Surgeon: Daneil Dolin, MD;  [facility]: [facility];  Service: Endoscopy;  Laterality: N/A;  7:30 am  ? ERCP N/A 04/14/2015  ? Procedure: ENDOSCOPIC RETROGRADE CHOLANGIOPANCREATOGRAPHY (ERCP) Biliary Sphincterotomy, 10x7 stent placement Dilated bilary system just not well seen;  Surgeon: Rogene Houston, MD;  Location: [facility];  Service: Endoscopy;  Laterality: N/A;  ? ERCP N/A 06/12/2015  ? Procedure: ENDOSCOPIC RETROGRADE CHOLANGIOPANCREATOGRAPHY (ERCP);  Surgeon:  Rogene Houston, MD;  [facility];  Service: Endoscopy;  Laterality: N/A;  ? ESOPHAGOGASTRODUODENOSCOPY N/A 06/12/2015  ? Procedure: DIAGNOSTIC ESOPHAGOGASTRODUODENOSCOPY (EGD);  Surgeon: Rogene Houston, MD;  [facility];  Service: Endoscopy;   Laterality: N/A;  ? FEMUR IM NAIL Right 05/11/2019  ? Procedure: RETROGRADE INTRAMEDULLARY NAIL FEMORAL;  Surgeon: Shona Needles, MD;  Location: [facility];  Service: Orthopedics;  Laterality: Right;  ? STENT REMOVAL  06/12/2015  ? Procedure: STENT REMOVAL ;  Surgeon: Rogene Houston, MD;  [facility];  Service: Endoscopy;;  ? ? ?SOCIAL HISTORY:  ?Social History  ? ?Socioeconomic History  ? Marital status: Widowed  ?  Spouse name: Not on file  ? Number of children: 2  ? Years of education: Not on file  ? Highest education level: Not on file  ?Occupational History  ? Occupation: retired  ?Tobacco Use  ? Smoking status: Former  ?  Packs/day: 0.15  ?  Years: 20.00  ?  Pack years: 3.00  ?  Types: Cigarettes  ?  Quit date: 03/08/2013  ?  Years since quitting: 8.3  ? Smokeless tobacco: Never  ?Vaping Use  ? Vaping Use: Never used  ?Substance and Sexual Activity  ? Alcohol use: No  ?  Alcohol/week: 0.0 standard drinks  ? Drug use: No  ? Sexual activity: Not Currently  ?Other Topics Concern  ? Not on file  ?Social History Narrative  ? Active in gardens and does yard work.  ? ?Social Determinants of Health  ? ?Financial Resource Strain: Not on file  ?Food Insecurity: Not on file  ?Transportation Needs: Not on file  ?Physical Activity: Not on file  ?Stress: Not on file  ?Social Connections: Not on file  ?Intimate Partner Violence: Not on file  ? ? ?FAMILY HISTORY:  ?Family History  ?Problem Relation Age of Onset  ? Heart attack Father   ? Stroke Father   ? Breast cancer Sister   ? Thyroid disease Neg Hx   ? Colon cancer Neg Hx   ? ? ?CURRENT MEDICATIONS:  ?Current Outpatient Medications  ?Medication Sig Dispense Refill  ? acyclovir (ZOVIRAX) 400 MG tablet TAKE (1) TABLET BY MOUTH TWICE DAILY. 60 tablet 6  ? aspirin EC 81 MG tablet Take 81 mg by mouth daily.    ? carboxymethylcellulose (REFRESH PLUS) 0.5 % SOLN Place 1 drop into both eyes in the morning, at noon, in the evening, and at bedtime.    ? Cholecalciferol (VITAMIN  D) 125 MCG (5000 UT) CAPS Take 5,000 Units by mouth daily. 90 capsule 1  ? lenalidomide (REVLIMID) 10 MG capsule Take one capsule daily on days 1-14 every 21 days. 14 capsule 0  ? levothyroxine (SYNTHROID) 137 MCG tablet Take 1 tablet (137 mcg total) by mouth daily before breakfast. 90 tablet 3  ? prednisoLONE acetate (PRED FORTE) 1 % ophthalmic suspension Place 1 drop into the right eye 4 (four) times daily.    ? SSD 1 % cream Apply topically daily.    ? dexamethasone (DECADRON) 4 MG tablet TAKE 2 TABLETS BY MOUTH once weekly 30 tablet 3  ? ?No current facility-administered medications for this visit.  ? ?Facility-Administered Medications Ordered in Other Visits  ?Medication Dose Route Frequency Provider Last Rate Last Admin  ? denosumab (XGEVA) injection 120 mg  120 mg Subcutaneous Once Derek Jack, MD      ? ? ?ALLERGIES:  ?No Known Allergies ? ?PHYSICAL EXAM:  ?Performance status (  ECOG): 1 - Symptomatic but completely ambulatory ? ?Vitals:  ? 06/30/21 1347  ?BP: 117/61  ?Pulse: 86  ?Resp: 18  ?Temp: 97.7 ?F (36.5 ?C)  ?SpO2: 100%  ? ?Wt Readings from Last 3 Encounters:  ?06/30/21 142 lb 10.2 oz (64.7 kg)  ?05/05/21 139 lb 14.4 oz (63.5 kg)  ?04/07/21 143 lb 6.4 oz (65 kg)  ? ?Physical Exam ?Vitals reviewed.  ?Constitutional:   ?   Appearance: Normal appearance.  ?Cardiovascular:  ?   Rate and Rhythm: Normal rate and regular rhythm.  ?   Pulses: Normal pulses.  ?   Heart sounds: Normal heart sounds.  ?Pulmonary:  ?   Effort: Pulmonary effort is normal.  ?   Breath sounds: Normal breath sounds.  ?Neurological:  ?   General: No focal deficit present.  ?   Mental Status: She is alert and oriented to person, place, and time.  ?Psychiatric:     ?   Mood and Affect: Mood normal.     ?   Behavior: Behavior normal.  ?  ? ?LABORATORY DATA:  ?I have reviewed the labs as listed.  ? ?  Latest Ref Rng & Units 06/23/2021  ?  1:04 PM 06/02/2021  ?  1:52 PM 05/05/2021  ? 11:50 AM  ?CBC  ?WBC 4.0 - 10.5 K/uL 2.1   2.1   3.0     ?Hemoglobin 12.0 - 15.0 g/dL 8.0   8.3   9.1    ?Hematocrit 36.0 - 46.0 % 26.8   27.3   29.5    ?Platelets 150 - 400 K/uL 127   132   137    ? ? ?  Latest Ref Rng & Units 06/23/2021  ?  1:04 PM 3/28/

## 2021-06-30 NOTE — Patient Instructions (Signed)
[facility]  Discharge Instructions: ?Thank you for choosing [facility] to provide your oncology and hematology care.  ?If you have a lab appointment with the [facility], please come in thru the Main Entrance and check in at the main information desk. ? ?Wear comfortable clothing and clothing appropriate for easy access to any Portacath or PICC line.  ? ?We strive to give you quality time with your provider. You may need to reschedule your appointment if you arrive late (15 or more minutes).  Arriving late affects you and other patients whose appointments are after yours.  Also, if you miss three or more appointments without notifying the office, you may be dismissed from the clinic at the provider?s discretion.    ?  ?For prescription refill requests, have your pharmacy contact our office and allow 72 hours for refills to be completed.   ? ?Today you received the following chemotherapy and/or immunotherapy agents Xgeva and labs draw.    ?  ?To help prevent nausea and vomiting after your treatment, we encourage you to take your nausea medication as directed. ? ?BELOW ARE SYMPTOMS THAT SHOULD BE REPORTED IMMEDIATELY: ?*FEVER GREATER THAN 100.4 F (38 ?C) OR HIGHER ?*CHILLS OR SWEATING ?*NAUSEA AND VOMITING THAT IS NOT CONTROLLED WITH YOUR NAUSEA MEDICATION ?*UNUSUAL SHORTNESS OF BREATH ?*UNUSUAL BRUISING OR BLEEDING ?*URINARY PROBLEMS (pain or burning when urinating, or frequent urination) ?*BOWEL PROBLEMS (unusual diarrhea, constipation, pain near the anus) ?TENDERNESS IN MOUTH AND THROAT WITH OR WITHOUT PRESENCE OF ULCERS (sore throat, sores in mouth, or a toothache) ?UNUSUAL RASH, SWELLING OR PAIN  ?UNUSUAL VAGINAL DISCHARGE OR ITCHING  ? ?Items with * indicate a potential emergency and should be followed up as soon as possible or go to the Emergency Department if any problems should occur. ? ?Please show the CHEMOTHERAPY ALERT CARD or IMMUNOTHERAPY ALERT CARD at check-in to the  Emergency Department and triage nurse. ? ?Should you have questions after your visit or need to cancel or reschedule your appointment, please contact [facility] [phone]  and follow the prompts.  Office hours are 8:00 a.m. to 4:30 p.m. Monday - Friday. Please note that voicemails left after 4:00 p.m. may not be returned until the following business day.  We are closed weekends and major holidays. You have access to a nurse at all times for urgent questions. Please call the main number to the clinic [phone] and follow the prompts. ? ?For any non-urgent questions, you may also contact your provider using MyChart. We now offer e-Visits for anyone 21 and older to request care online for non-urgent symptoms. For details visit [email or web address]. ?  ?Also download the MyChart app! Go to the app store, search "MyChart", open the app, select [facility], and log in with your MyChart username and password. ? ?Due to Covid, a mask is required upon entering the hospital/clinic. If you do not have a mask, one will be given to you upon arrival. For doctor visits, patients may have 1 support person aged 60 or older with them. For treatment visits, patients cannot have anyone with them due to current Covid guidelines and our immunocompromised population.  ?

## 2021-06-30 NOTE — Patient Instructions (Addendum)
[facility] ?Discharge Instructions ? ? ?You were seen and examined today by Dr. Delton Coombes. ? ?He reviewed your lab results.  Your hemoglobin is low. We will check additional blood work today.  If those results are normal, we will arrange for you to have a bone marrow biopsy to further investigate your anemia. ? ?We will proceed with your Xgeva injection today.  ? ?Return as scheduled.  ? ? ?Thank you for choosing [facility] at [facility] to provide your oncology and hematology care.  To afford each patient quality time with our provider, please arrive at least 15 minutes before your scheduled appointment time.  ? ?If you have a lab appointment with the [facility] please come in thru the Main Entrance and check in at the main information desk. ? ?You need to re-schedule your appointment should you arrive 10 or more minutes late.  We strive to give you quality time with our providers, and arriving late affects you and other patients whose appointments are after yours.  Also, if you no show three or more times for appointments you may be dismissed from the clinic at the providers discretion.     ?Again, thank you for choosing [facility].  Our hope is that these requests will decrease the amount of time that you wait before being seen by our physicians.       ?_____________________________________________________________ ? ?Should you have questions after your visit to [facility], please contact our office at [phone] and follow the prompts.  Our office hours are 8:00 a.m. and 4:30 p.m. Monday - Friday.  Please note that voicemails left after 4:00 p.m. may not be returned until the following business day.  We are closed weekends and major holidays.  You do have access to a nurse 24-7, just call the main number to the clinic [phone] and do not press any options, hold on the line and a nurse will answer the phone.    ? ?For prescription refill requests, have your pharmacy contact our office and allow 72 hours.   ? ?Due to Covid, you will need to wear a mask upon entering the hospital. If you do not have a mask, a mask will be given to you at the Main Entrance upon arrival. For doctor visits, patients may have 1 support person age 77 or older with them. For treatment visits, patients can not have anyone with them due to social distancing guidelines and our immunocompromised population.  ? ?   ?

## 2021-07-02 LAB — METHYLMALONIC ACID, SERUM: Methylmalonic Acid, Quantitative: 201 nmol/L (ref 0–378)

## 2021-07-16 ENCOUNTER — Other Ambulatory Visit (HOSPITAL_COMMUNITY): Payer: Self-pay

## 2021-07-16 ENCOUNTER — Other Ambulatory Visit: Payer: Self-pay | Admitting: Radiology

## 2021-07-16 DIAGNOSIS — C9000 Multiple myeloma not having achieved remission: Secondary | ICD-10-CM

## 2021-07-16 MED ORDER — LENALIDOMIDE 10 MG PO CAPS: ORAL_CAPSULE | ORAL | 0 refills | Status: DC

## 2021-07-16 NOTE — Telephone Encounter (Signed)
Chart reviewed. Revlimid refilled per last office note with Dr. Katragadda.  

## 2021-07-16 NOTE — H&P (Addendum)
*Following lab results, pt was found to have Hgb of 6.4. Dr. Elby Showers made aware and advises pt needs to reschedule bone marrow biopsy when her levels are within normal limits.  Dr. Marice Potter office was notified of critical Hgb and requests that pt go directly to their office this morning for repeat labs and transfusion. Plan explained to patient.   Chief Complaint: Patient was seen in consultation today for bone marrow biopsy and aspirationat the request of Katragadda,Sreedhar  Referring Physician(s): Katragadda,Sreedhar  Supervising Physician: Marliss Coots  Patient Status: [facility] - Out-pt  History of Present Illness: [facility] is a 81 y.o. female with past medical history of transient A-fib, HTN and tobacco abuse.  Patient being followed by Dr. Ellin Saba for multiple myeloma.  Patient was diagnosed 06/07/2019 and has been receiving treatments.  Dr. Ellin Saba is requesting bone marrow biopsy due to not having achieved remission of multiple myeloma and progressing anemia.  Past Medical History:  Diagnosis Date   Atrial fibrillation (HCC) 2011   Postop, spontaneous conversion to normal sinus after one hour   Compression fracture 07/24/09   T12; kyphoplasty   History of echocardiogram 5/11   EF 65%   Hypertension    Thyroid disease    Tobacco abuse     Past Surgical History:  Procedure Laterality Date   BACK SURGERY     BACK SURGERY  06/06/2015   BREAST EXCISIONAL BIOPSY Left    50 years ago  benign   CHOLECYSTECTOMY N/A 04/25/2015   Procedure: LAPAROSCOPIC CHOLECYSTECTOMY WITH INTRAOPERATIVE CHOLANGIOGRAM;  Surgeon: Rodman Pickle, MD;  Location: [facility];  Service: General;  Laterality: N/A;   COLONOSCOPY N/A 11/26/2015   Procedure: COLONOSCOPY;  Surgeon: Corbin Ade, MD;  [facility];  Service: Endoscopy;  Laterality: N/A;  7:30 am   ERCP N/A 04/14/2015   Procedure: ENDOSCOPIC RETROGRADE CHOLANGIOPANCREATOGRAPHY (ERCP) Biliary Sphincterotomy, 10x7  stent placement Dilated bilary system just not well seen;  Surgeon: Malissa Hippo, MD;  Location: [facility];  Service: Endoscopy;  Laterality: N/A;   ERCP N/A 06/12/2015   Procedure: ENDOSCOPIC RETROGRADE CHOLANGIOPANCREATOGRAPHY (ERCP);  Surgeon: Malissa Hippo, MD;  [facility];  Service: Endoscopy;  Laterality: N/A;   ESOPHAGOGASTRODUODENOSCOPY N/A 06/12/2015   Procedure: DIAGNOSTIC ESOPHAGOGASTRODUODENOSCOPY (EGD);  Surgeon: Malissa Hippo, MD;  [facility];  Service: Endoscopy;  Laterality: N/A;   FEMUR IM NAIL Right 05/11/2019   Procedure: RETROGRADE INTRAMEDULLARY NAIL FEMORAL;  Surgeon: Roby Lofts, MD;  Location: [facility];  Service: Orthopedics;  Laterality: Right;   STENT REMOVAL  06/12/2015   Procedure: STENT REMOVAL ;  Surgeon: Malissa Hippo, MD;  [facility];  Service: Endoscopy;;    Allergies: Patient has no known allergies.  Medications: Prior to Admission medications   Medication Sig Start Date End Date Taking? Authorizing Provider  acyclovir (ZOVIRAX) 400 MG tablet TAKE (1) TABLET BY MOUTH TWICE DAILY. 05/07/21   Doreatha Massed, MD  aspirin EC 81 MG tablet Take 81 mg by mouth daily.    [provider]  carboxymethylcellulose (REFRESH PLUS) 0.5 % SOLN Place 1 drop into both eyes in the morning, at noon, in the evening, and at bedtime.    [provider]  Cholecalciferol (VITAMIN D) 125 MCG (5000 UT) CAPS Take 5,000 Units by mouth daily. 05/14/19   West Bali, PA-C  dexamethasone (DECADRON) 4 MG tablet TAKE 2 TABLETS BY MOUTH once weekly 06/30/21   Doreatha Massed, MD  lenalidomide (  REVLIMID) 10 MG capsule Take one capsule daily on days 1-14 every 21 days. 07/16/21   Doreatha Massed, MD  levothyroxine (SYNTHROID) 137 MCG tablet Take 1 tablet (137 mcg total) by mouth daily before breakfast. 02/17/21   Romero Belling, MD  prednisoLONE acetate (PRED FORTE) 1 % ophthalmic suspension Place 1 drop into the right eye 4  (four) times daily. 08/15/20   [provider]  SSD 1 % cream Apply topically daily. 06/10/21   [provider]     Family History  Problem Relation Age of Onset   Heart attack Father    Stroke Father    Breast cancer Sister    Thyroid disease Neg Hx    Colon cancer Neg Hx     Social History   Socioeconomic History   Marital status: Widowed    Spouse name: Not on file   Number of children: 2   Years of education: Not on file   Highest education level: Not on file  Occupational History   Occupation: retired  Tobacco Use   Smoking status: Former    Packs/day: 0.15    Years: 20.00    Pack years: 3.00    Types: Cigarettes    Quit date: 03/08/2013    Years since quitting: 8.3   Smokeless tobacco: Never  Vaping Use   Vaping Use: Never used  Substance and Sexual Activity   Alcohol use: No    Alcohol/week: 0.0 standard drinks   Drug use: No   Sexual activity: Not Currently  Other Topics Concern   Not on file  Social History Narrative   Active in gardens and does yard work.   Social Determinants of Health   Financial Resource Strain: Not on file  Food Insecurity: Not on file  Transportation Needs: Not on file  Physical Activity: Not on file  Stress: Not on file  Social Connections: Not on file    Review of Systems: A 12 point ROS discussed and pertinent positives are indicated in the HPI above.  All other systems are negative.  Review of Systems  Constitutional:  Negative for appetite change, chills and fever.  Respiratory:  Negative for shortness of breath.   Cardiovascular:  Negative for chest pain and leg swelling.  Gastrointestinal:  Negative for abdominal pain, nausea and vomiting.  Musculoskeletal:  Positive for myalgias.  Neurological:  Negative for dizziness and headaches.   Vital Signs: BP 134/68 (BP Location: Right Arm)   Pulse 67   Temp 97.8 F (36.6 C) (Oral)   Resp 15   SpO2 98%   Physical Exam Constitutional:      General:  She is not in acute distress.    Appearance: Normal appearance. She is not ill-appearing.  HENT:     Head: Normocephalic and atraumatic.     Mouth/Throat:     Mouth: Mucous membranes are moist.     Pharynx: Oropharynx is clear.  Eyes:     Extraocular Movements: Extraocular movements intact.     Pupils: Pupils are equal, round, and reactive to light.     Comments: Pt has ecchymosis under right eye from recent fall   Cardiovascular:     Rate and Rhythm: Normal rate and regular rhythm.     Pulses: Normal pulses.     Heart sounds: Normal heart sounds.  Pulmonary:     Effort: Pulmonary effort is normal. No respiratory distress.  Abdominal:     General: Bowel sounds are normal. There is no distension.  Palpations: Abdomen is soft.     Tenderness: There is no abdominal tenderness. There is no guarding.  Musculoskeletal:     Right lower leg: No edema.     Left lower leg: No edema.  Skin:    General: Skin is warm and dry.  Neurological:     Mental Status: She is alert and oriented to person, place, and time.  Psychiatric:        Mood and Affect: Mood normal.        Behavior: Behavior normal.        Thought Content: Thought content normal.        Judgment: Judgment normal.    Imaging: No results found.  Labs:  CBC: Recent Labs    04/07/21 1252 05/05/21 1150 06/02/21 1352 06/23/21 1304  WBC 1.4* 3.0* 2.1* 2.1*  HGB 8.4* 9.1* 8.3* 8.0*  HCT 26.5* 29.5* 27.3* 26.8*  PLT 95* 137* 132* 127*    COAGS: No results for input(s): INR, APTT in the last 8760 hours.  BMP: Recent Labs    04/07/21 1252 05/05/21 1150 06/02/21 1352 06/23/21 1304  NA 138 136 139 136  K 3.4* 3.6 3.6 3.5  CL 100 98 98 100  CO2 31 28 30 29   GLUCOSE 116* 99 105* 118*  BUN 19 15 15 20   CALCIUM 9.1 9.5 9.2 8.7*  CREATININE 0.75 0.71 0.87 0.83  GFRNONAA >60 >60 >60 >60    LIVER FUNCTION TESTS: Recent Labs    04/07/21 1252 05/05/21 1150 06/02/21 1352 06/23/21 1304  BILITOT 0.4 0.5 0.6  0.4  AST 18 13* 14* 16  ALT 23 9 12 16   ALKPHOS 94 65 80 84  PROT 6.3* 6.6 6.3* 6.3*  ALBUMIN 3.6 3.8 3.7 3.4*    TUMOR MARKERS: No results for input(s): AFPTM, CEA, CA199, CHROMGRNA in the last 8760 hours.  Assessment and Plan: History of transient A-fib, HTN and tobacco abuse.  Patient being followed by Dr. Ellin Saba for multiple myeloma.  Patient was diagnosed 06/07/2019 and has been receiving treatments.  Dr. Ellin Saba is requesting bone marrow biopsy due to not having achieved remission of multiple myeloma and progressing anemia.  Pt resting on stretcher. She is A&O, calm and pleasant.  She is in no distress.  Pt states she is NPO per order.  Today's labs pending.   Risks and benefits of bone marrow biopsy and aspiration with moderate sedation was discussed with the patient and/or patient's family including, but not limited to bleeding, infection, damage to adjacent structures or low yield requiring additional tests.  All of the questions were answered and there is agreement to proceed.  Consent signed and in chart.   Thank you for this interesting consult.  I greatly enjoyed meeting EMBERLI LINNEHAN and look forward to participating in their care.  A copy of this report was sent to the requesting provider on this date.  Electronically Signed: Shon Hough, NP 07/17/2021, 8:05 AM   I spent a total of 20 minutes in face to face in clinical consultation, greater than 50% of which was counseling/coordinating care for bone marrow biopsy and aspiration.

## 2021-07-17 ENCOUNTER — Other Ambulatory Visit (HOSPITAL_COMMUNITY): Payer: Self-pay | Admitting: Physician Assistant

## 2021-07-17 ENCOUNTER — Other Ambulatory Visit: Payer: Self-pay

## 2021-07-17 ENCOUNTER — Encounter (HOSPITAL_COMMUNITY): Payer: Self-pay

## 2021-07-17 ENCOUNTER — Inpatient Hospital Stay (HOSPITAL_COMMUNITY): Payer: Medicare Other | Attending: Physician Assistant

## 2021-07-17 ENCOUNTER — Ambulatory Visit (HOSPITAL_COMMUNITY)
Admission: RE | Admit: 2021-07-17 | Discharge: 2021-07-17 | Disposition: A | Discharge status: Home / Self Care | Payer: Medicare Other | Source: Ambulatory Visit | Attending: Hematology | Admitting: Hematology

## 2021-07-17 ENCOUNTER — Inpatient Hospital Stay (HOSPITAL_COMMUNITY): Payer: Medicare Other

## 2021-07-17 VITALS — BP 110/62 | HR 63 | Temp 98.3°F | Resp 18

## 2021-07-17 DIAGNOSIS — C9000 Multiple myeloma not having achieved remission: Secondary | ICD-10-CM | POA: Diagnosis not present

## 2021-07-17 DIAGNOSIS — D649 Anemia, unspecified: Secondary | ICD-10-CM | POA: Diagnosis not present

## 2021-07-17 LAB — CBC WITH DIFFERENTIAL/PLATELET
Abs Immature Granulocytes: 0.01 10*3/uL (ref 0.00–0.07)
Abs Immature Granulocytes: 0.02 10*3/uL (ref 0.00–0.07)
Basophils Absolute: 0 10*3/uL (ref 0.0–0.1)
Basophils Absolute: 0 10*3/uL (ref 0.0–0.1)
Basophils Relative: 0 %
Basophils Relative: 1 %
Eosinophils Absolute: 0 10*3/uL (ref 0.0–0.5)
Eosinophils Absolute: 0 10*3/uL (ref 0.0–0.5)
Eosinophils Relative: 0 %
Eosinophils Relative: 1 %
HCT: 21 % — ABNORMAL LOW (ref 36.0–46.0)
HCT: 21.8 % — ABNORMAL LOW (ref 36.0–46.0)
Hemoglobin: 6.4 g/dL — CL (ref 12.0–15.0)
Hemoglobin: 6.8 g/dL — CL (ref 12.0–15.0)
Immature Granulocytes: 1 %
Immature Granulocytes: 1 %
Lymphocytes Relative: 31 %
Lymphocytes Relative: 33 %
Lymphs Abs: 0.6 10*3/uL — ABNORMAL LOW (ref 0.7–4.0)
Lymphs Abs: 0.7 10*3/uL (ref 0.7–4.0)
MCH: 29 pg (ref 26.0–34.0)
MCH: 29.1 pg (ref 26.0–34.0)
MCHC: 30.5 g/dL (ref 30.0–36.0)
MCHC: 31.2 g/dL (ref 30.0–36.0)
MCV: 95 fL (ref 80.0–100.0)
MCV: 93.2 fL (ref 80.0–100.0)
Monocytes Absolute: 0.2 10*3/uL (ref 0.1–1.0)
Monocytes Absolute: 0.2 10*3/uL (ref 0.1–1.0)
Monocytes Relative: 10 %
Monocytes Relative: 7 %
Neutro Abs: 1.1 10*3/uL — ABNORMAL LOW (ref 1.7–7.7)
Neutro Abs: 1.3 10*3/uL — ABNORMAL LOW (ref 1.7–7.7)
Neutrophils Relative %: 58 %
Neutrophils Relative %: 57 %
Platelets: 125 10*3/uL — ABNORMAL LOW (ref 150–400)
Platelets: 130 10*3/uL — ABNORMAL LOW (ref 150–400)
RBC: 2.21 MIL/uL — ABNORMAL LOW (ref 3.87–5.11)
RBC: 2.34 MIL/uL — ABNORMAL LOW (ref 3.87–5.11)
RDW: 18.1 % — ABNORMAL HIGH (ref 11.5–15.5)
RDW: 18.2 % — ABNORMAL HIGH (ref 11.5–15.5)
WBC: 1.9 10*3/uL — ABNORMAL LOW (ref 4.0–10.5)
WBC: 2.2 10*3/uL — ABNORMAL LOW (ref 4.0–10.5)
nRBC: 0 % (ref 0.0–0.2)
nRBC: 0 % (ref 0.0–0.2)

## 2021-07-17 LAB — PREPARE RBC (CROSSMATCH)

## 2021-07-17 MED ORDER — ACETAMINOPHEN 325 MG PO TABS
650.0000 mg | ORAL_TABLET | Freq: Once | ORAL | Status: AC
  Administered 2021-07-17: 650 mg via ORAL
  Filled 2021-07-17: qty 2

## 2021-07-17 MED ORDER — SODIUM CHLORIDE 0.9 % IV SOLN: INTRAVENOUS | Status: DC

## 2021-07-17 MED ORDER — DIPHENHYDRAMINE HCL 25 MG PO CAPS
25.0000 mg | ORAL_CAPSULE | Freq: Once | ORAL | Status: AC
  Administered 2021-07-17: 25 mg via ORAL
  Filled 2021-07-17: qty 1

## 2021-07-17 MED ORDER — SODIUM CHLORIDE 0.9% IV SOLUTION
250.0000 mL | Freq: Once | INTRAVENOUS | Status: AC
  Administered 2021-07-17: 250 mL via INTRAVENOUS

## 2021-07-17 NOTE — Progress Notes (Signed)
Patient's daughter was notified that pt has critically low Hgb and bone marrow biopsy planned for today will need to be rescheduled. She was also advised that Dr. Tomie China office is requesting her to come this morning for blood transfusion. Pt IV being left in place so she may [facility].  ?Pt's daughter verbalized understanding of plan.  ? ? ? ?Narda Rutherford, AGNP-BC ?07/17/2021, 9:12 AM ? ? ?

## 2021-07-17 NOTE — Progress Notes (Signed)
Date and time results received: 07/17/21 0810 ? ?Test: Hgb ?Critical Value: 6.4 ? ?Name of Provider Notified: Rowe Robert, PA ? ?Orders Received? Or Actions Taken?:  No orders given ?

## 2021-07-17 NOTE — Patient Instructions (Signed)
[facility]  Discharge Instructions: ?Thank you for choosing [facility] to provide your oncology and hematology care.  ?If you have a lab appointment with the [facility], please come in thru the Main Entrance and check in at the main information desk. ? ?Wear comfortable clothing and clothing appropriate for easy access to any Portacath or PICC line.  ? ?We strive to give you quality time with your provider. You may need to reschedule your appointment if you arrive late (15 or more minutes).  Arriving late affects you and other patients whose appointments are after yours.  Also, if you miss three or more appointments without notifying the office, you may be dismissed from the clinic at the provider?s discretion.    ?  ?For prescription refill requests, have your pharmacy contact our office and allow 72 hours for refills to be completed.   ? ?Today you received the following chemotherapy and/or immunotherapy agents  2 units of blood.     ?  ?To help prevent nausea and vomiting after your treatment, we encourage you to take your nausea medication as directed. ? ?BELOW ARE SYMPTOMS THAT SHOULD BE REPORTED IMMEDIATELY: ?*FEVER GREATER THAN 100.4 F (38 ?C) OR HIGHER ?*CHILLS OR SWEATING ?*NAUSEA AND VOMITING THAT IS NOT CONTROLLED WITH YOUR NAUSEA MEDICATION ?*UNUSUAL SHORTNESS OF BREATH ?*UNUSUAL BRUISING OR BLEEDING ?*URINARY PROBLEMS (pain or burning when urinating, or frequent urination) ?*BOWEL PROBLEMS (unusual diarrhea, constipation, pain near the anus) ?TENDERNESS IN MOUTH AND THROAT WITH OR WITHOUT PRESENCE OF ULCERS (sore throat, sores in mouth, or a toothache) ?UNUSUAL RASH, SWELLING OR PAIN  ?UNUSUAL VAGINAL DISCHARGE OR ITCHING  ? ?Items with * indicate a potential emergency and should be followed up as soon as possible or go to the Emergency Department if any problems should occur. ? ?Please show the CHEMOTHERAPY ALERT CARD or IMMUNOTHERAPY ALERT CARD at check-in to the  Emergency Department and triage nurse. ? ?Should you have questions after your visit or need to cancel or reschedule your appointment, please contact [facility] [phone]  and follow the prompts.  Office hours are 8:00 a.m. to 4:30 p.m. Monday - Friday. Please note that voicemails left after 4:00 p.m. may not be returned until the following business day.  We are closed weekends and major holidays. You have access to a nurse at all times for urgent questions. Please call the main number to the clinic [phone] and follow the prompts. ? ?For any non-urgent questions, you may also contact your provider using MyChart. We now offer e-Visits for anyone 73 and older to request care online for non-urgent symptoms. For details visit [email or web address]. ?  ?Also download the MyChart app! Go to the app store, search "MyChart", open the app, select [place], and log in with your MyChart username and password. ? ?Due to Covid, a mask is required upon entering the hospital/clinic. If you do not have a mask, one will be given to you upon arrival. For doctor visits, patients may have 1 support person aged 2 or older with them. For treatment visits, patients cannot have anyone with them due to current Covid guidelines and our immunocompromised population.  ?

## 2021-07-17 NOTE — Progress Notes (Signed)
CRITICAL VALUE ALERT ?Critical value received:  HGB 6.8 ?Date of notification:  07/17/2021 ?Time of notification: 11:36 am ?Critical value read back:  yes ?Nurse who received alert:  B. Lorenza Shakir RN ?MD notified time and response:  11:38 am R. Pennington . Infuse 2 units of PRBC's.  ? ? Patient presents today for 2 units of PRBC's. Patient was at [facility] for bone marrow biopsy and reached out to our clinic to give patient 2 units of blood. HGB 6.4. Bone marrow biopsy not performed. Patient denies any shortness of breath, dizziness or pain today. Patient has no complaints.  ? ?2 units of blood given today per MD orders. Tolerated infusion without adverse affects. Vital signs stable. No complaints at this time. Discharged from clinic ambulatory in stable condition. Alert and oriented x 3. F/U with [facility] as scheduled.   ?

## 2021-07-18 LAB — BPAM RBC
Blood Product Expiration Date: 202306132359
Blood Product Expiration Date: 202306132359
ISSUE DATE / TIME: 202305121143
ISSUE DATE / TIME: 202305121328
Unit Type and Rh: 5100
Unit Type and Rh: 5100

## 2021-07-18 LAB — TYPE AND SCREEN
ABO/RH(D): O POS
Antibody Screen: NEGATIVE
Unit division: 0
Unit division: 0

## 2021-07-20 ENCOUNTER — Other Ambulatory Visit (HOSPITAL_COMMUNITY): Payer: Self-pay | Admitting: *Deleted

## 2021-07-20 ENCOUNTER — Encounter (HOSPITAL_COMMUNITY): Payer: Self-pay

## 2021-07-20 DIAGNOSIS — C9000 Multiple myeloma not having achieved remission: Secondary | ICD-10-CM

## 2021-07-20 DIAGNOSIS — D649 Anemia, unspecified: Secondary | ICD-10-CM

## 2021-07-20 NOTE — Progress Notes (Signed)
Advised patient's daughter that she can restart revlimid per Dr. Delton Coombes. Request made to reschedule BMBx and to not cancel moving forward due to anemia, as anemia is the reason for the BMBx. ?

## 2021-07-28 ENCOUNTER — Inpatient Hospital Stay (HOSPITAL_COMMUNITY): Payer: Medicare Other

## 2021-07-28 ENCOUNTER — Ambulatory Visit (HOSPITAL_COMMUNITY): Payer: Medicare Other | Admitting: Hematology

## 2021-07-28 ENCOUNTER — Other Ambulatory Visit (HOSPITAL_COMMUNITY): Payer: Self-pay

## 2021-07-28 VITALS — BP 113/55 | HR 81 | Temp 98.1°F | Resp 18

## 2021-07-28 DIAGNOSIS — C9000 Multiple myeloma not having achieved remission: Secondary | ICD-10-CM

## 2021-07-28 DIAGNOSIS — D649 Anemia, unspecified: Secondary | ICD-10-CM | POA: Diagnosis not present

## 2021-07-28 LAB — COMPREHENSIVE METABOLIC PANEL
ALT: 14 U/L (ref 0–44)
AST: 14 U/L — ABNORMAL LOW (ref 15–41)
Albumin: 3.5 g/dL (ref 3.5–5.0)
Alkaline Phosphatase: 97 U/L (ref 38–126)
Anion gap: 9 (ref 5–15)
BUN: 17 mg/dL (ref 8–23)
CO2: 28 mmol/L (ref 22–32)
Calcium: 8.7 mg/dL — ABNORMAL LOW (ref 8.9–10.3)
Chloride: 100 mmol/L (ref 98–111)
Creatinine, Ser: 0.79 mg/dL (ref 0.44–1.00)
GFR, Estimated: >60 mL/min (ref >60)
Glucose, Bld: 101 mg/dL — ABNORMAL HIGH (ref 70–99)
Potassium: 3.5 mmol/L (ref 3.5–5.1)
Sodium: 137 mmol/L (ref 135–145)
Total Bilirubin: 0.4 mg/dL (ref 0.3–1.2)
Total Protein: 6 g/dL — ABNORMAL LOW (ref 6.5–8.1)

## 2021-07-28 LAB — CBC WITH DIFFERENTIAL/PLATELET
Abs Immature Granulocytes: 0.01 10*3/uL (ref 0.00–0.07)
Basophils Absolute: 0 10*3/uL (ref 0.0–0.1)
Basophils Relative: 1 %
Eosinophils Absolute: 0.1 10*3/uL (ref 0.0–0.5)
Eosinophils Relative: 4 %
HCT: 30.4 % — ABNORMAL LOW (ref 36.0–46.0)
Hemoglobin: 9.7 g/dL — ABNORMAL LOW (ref 12.0–15.0)
Immature Granulocytes: 1 %
Lymphocytes Relative: 50 %
Lymphs Abs: 1 10*3/uL (ref 0.7–4.0)
MCH: 29.1 pg (ref 26.0–34.0)
MCHC: 31.9 g/dL (ref 30.0–36.0)
MCV: 91.3 fL (ref 80.0–100.0)
Monocytes Absolute: 0.3 10*3/uL (ref 0.1–1.0)
Monocytes Relative: 16 %
Neutro Abs: 0.5 10*3/uL — ABNORMAL LOW (ref 1.7–7.7)
Neutrophils Relative %: 28 %
Platelets: 70 10*3/uL — ABNORMAL LOW (ref 150–400)
RBC: 3.33 MIL/uL — ABNORMAL LOW (ref 3.87–5.11)
RDW: 16.1 % — ABNORMAL HIGH (ref 11.5–15.5)
WBC: 2 10*3/uL — ABNORMAL LOW (ref 4.0–10.5)
nRBC: 0 % (ref 0.0–0.2)

## 2021-07-28 LAB — SAMPLE TO BLOOD BANK

## 2021-07-28 LAB — LACTATE DEHYDROGENASE: LDH: 100 U/L (ref 98–192)

## 2021-07-28 MED ORDER — DENOSUMAB 120 MG/1.7ML ~~LOC~~ SOLN
120.0000 mg | Freq: Once | SUBCUTANEOUS | Status: AC
  Administered 2021-07-28: 120 mg via SUBCUTANEOUS
  Filled 2021-07-28: qty 1.7

## 2021-07-28 NOTE — Progress Notes (Signed)
Patient here today for xgeva per MD orders.  Patient remained stable during injection and tolerated wtihout incidence.  She was discharged ambulatory and in stable condition from clinic.  She will follow up next week for labs and then after her bmbx follow up with provider.

## 2021-07-28 NOTE — Patient Instructions (Signed)
You received your xgeva injection today.

## 2021-07-29 LAB — KAPPA/LAMBDA LIGHT CHAINS
Kappa free light chain: 24.5 mg/L — ABNORMAL HIGH (ref 3.3–19.4)
Kappa, lambda light chain ratio: 3.27 — ABNORMAL HIGH (ref 0.26–1.65)
Lambda free light chains: 7.5 mg/L (ref 5.7–26.3)

## 2021-07-30 LAB — PROTEIN ELECTROPHORESIS, SERUM
A/G Ratio: 1.3 (ref 0.7–1.7)
Albumin ELP: 3.2 g/dL (ref 2.9–4.4)
Alpha-1-Globulin: 0.3 g/dL (ref 0.0–0.4)
Alpha-2-Globulin: 0.8 g/dL (ref 0.4–1.0)
Beta Globulin: 1.2 g/dL (ref 0.7–1.3)
Gamma Globulin: 0.2 g/dL — ABNORMAL LOW (ref 0.4–1.8)
Globulin, Total: 2.5 g/dL (ref 2.2–3.9)
M-Spike, %: 0.3 g/dL — ABNORMAL HIGH
Total Protein ELP: 5.7 g/dL — ABNORMAL LOW (ref 6.0–8.5)

## 2021-07-30 LAB — IMMUNOFIXATION ELECTROPHORESIS
IgA: 355 mg/dL (ref 64–422)
IgG (Immunoglobin G), Serum: 250 mg/dL — ABNORMAL LOW (ref 586–1602)
IgM (Immunoglobulin M), Srm: 9 mg/dL — ABNORMAL LOW (ref 26–217)
Total Protein ELP: 5.7 g/dL — ABNORMAL LOW (ref 6.0–8.5)

## 2021-08-04 ENCOUNTER — Other Ambulatory Visit (HOSPITAL_COMMUNITY): Payer: Self-pay

## 2021-08-04 DIAGNOSIS — C9000 Multiple myeloma not having achieved remission: Secondary | ICD-10-CM

## 2021-08-04 MED ORDER — LENALIDOMIDE 10 MG PO CAPS: ORAL_CAPSULE | ORAL | 0 refills | Status: DC

## 2021-08-04 NOTE — Telephone Encounter (Signed)
Chart reviewed. Revlimid refilled per last office note with Dr. Katragadda.  

## 2021-08-06 ENCOUNTER — Other Ambulatory Visit: Payer: Self-pay | Admitting: Student

## 2021-08-06 ENCOUNTER — Inpatient Hospital Stay (HOSPITAL_COMMUNITY): Payer: Medicare Other | Attending: Hematology

## 2021-08-06 DIAGNOSIS — Z7961 Long term (current) use of immunomodulator: Secondary | ICD-10-CM | POA: Diagnosis not present

## 2021-08-06 DIAGNOSIS — D649 Anemia, unspecified: Secondary | ICD-10-CM | POA: Diagnosis not present

## 2021-08-06 DIAGNOSIS — D61818 Other pancytopenia: Secondary | ICD-10-CM

## 2021-08-06 DIAGNOSIS — Z79890 Hormone replacement therapy (postmenopausal): Secondary | ICD-10-CM | POA: Diagnosis not present

## 2021-08-06 DIAGNOSIS — E876 Hypokalemia: Secondary | ICD-10-CM | POA: Diagnosis not present

## 2021-08-06 DIAGNOSIS — Z7952 Long term (current) use of systemic steroids: Secondary | ICD-10-CM | POA: Diagnosis not present

## 2021-08-06 DIAGNOSIS — C9002 Multiple myeloma in relapse: Secondary | ICD-10-CM | POA: Insufficient documentation

## 2021-08-06 DIAGNOSIS — C9000 Multiple myeloma not having achieved remission: Secondary | ICD-10-CM

## 2021-08-06 LAB — CBC WITH DIFFERENTIAL/PLATELET
Abs Immature Granulocytes: 0.03 10*3/uL (ref 0.00–0.07)
Basophils Absolute: 0 10*3/uL (ref 0.0–0.1)
Basophils Relative: 0 %
Eosinophils Absolute: 0 10*3/uL (ref 0.0–0.5)
Eosinophils Relative: 0 %
HCT: 29.5 % — ABNORMAL LOW (ref 36.0–46.0)
Hemoglobin: 9.4 g/dL — ABNORMAL LOW (ref 12.0–15.0)
Immature Granulocytes: 1 %
Lymphocytes Relative: 21 %
Lymphs Abs: 0.6 10*3/uL — ABNORMAL LOW (ref 0.7–4.0)
MCH: 29.2 pg (ref 26.0–34.0)
MCHC: 31.9 g/dL (ref 30.0–36.0)
MCV: 91.6 fL (ref 80.0–100.0)
Monocytes Absolute: 0 10*3/uL — ABNORMAL LOW (ref 0.1–1.0)
Monocytes Relative: 1 %
Neutro Abs: 2.1 10*3/uL (ref 1.7–7.7)
Neutrophils Relative %: 77 %
Platelets: 93 10*3/uL — ABNORMAL LOW (ref 150–400)
RBC: 3.22 MIL/uL — ABNORMAL LOW (ref 3.87–5.11)
RDW: 16.8 % — ABNORMAL HIGH (ref 11.5–15.5)
WBC: 2.8 10*3/uL — ABNORMAL LOW (ref 4.0–10.5)
nRBC: 0 % (ref 0.0–0.2)

## 2021-08-06 LAB — SAMPLE TO BLOOD BANK

## 2021-08-07 ENCOUNTER — Encounter (HOSPITAL_COMMUNITY): Payer: Self-pay

## 2021-08-07 ENCOUNTER — Ambulatory Visit (HOSPITAL_COMMUNITY)
Admission: RE | Admit: 2021-08-07 | Discharge: 2021-08-07 | Disposition: A | Discharge status: Home / Self Care | Payer: Medicare Other | Source: Ambulatory Visit | Attending: Hematology | Admitting: Hematology

## 2021-08-07 DIAGNOSIS — D649 Anemia, unspecified: Secondary | ICD-10-CM | POA: Insufficient documentation

## 2021-08-07 DIAGNOSIS — I480 Paroxysmal atrial fibrillation: Secondary | ICD-10-CM | POA: Insufficient documentation

## 2021-08-07 DIAGNOSIS — Q998 Other specified chromosome abnormalities: Secondary | ICD-10-CM | POA: Insufficient documentation

## 2021-08-07 DIAGNOSIS — Z87891 Personal history of nicotine dependence: Secondary | ICD-10-CM | POA: Diagnosis not present

## 2021-08-07 DIAGNOSIS — D61818 Other pancytopenia: Secondary | ICD-10-CM | POA: Insufficient documentation

## 2021-08-07 DIAGNOSIS — C9000 Multiple myeloma not having achieved remission: Secondary | ICD-10-CM | POA: Diagnosis not present

## 2021-08-07 DIAGNOSIS — E079 Disorder of thyroid, unspecified: Secondary | ICD-10-CM | POA: Diagnosis not present

## 2021-08-07 DIAGNOSIS — I10 Essential (primary) hypertension: Secondary | ICD-10-CM | POA: Insufficient documentation

## 2021-08-07 DIAGNOSIS — D4989 Neoplasm of unspecified behavior of other specified sites: Secondary | ICD-10-CM | POA: Diagnosis not present

## 2021-08-07 LAB — CBC WITH DIFFERENTIAL/PLATELET
Abs Immature Granulocytes: 0.02 10*3/uL (ref 0.00–0.07)
Basophils Absolute: 0 10*3/uL (ref 0.0–0.1)
Basophils Relative: 0 %
Eosinophils Absolute: 0 10*3/uL (ref 0.0–0.5)
Eosinophils Relative: 0 %
HCT: 28 % — ABNORMAL LOW (ref 36.0–46.0)
Hemoglobin: 8.8 g/dL — ABNORMAL LOW (ref 12.0–15.0)
Immature Granulocytes: 1 %
Lymphocytes Relative: 29 %
Lymphs Abs: 0.6 10*3/uL — ABNORMAL LOW (ref 0.7–4.0)
MCH: 28.9 pg (ref 26.0–34.0)
MCHC: 31.4 g/dL (ref 30.0–36.0)
MCV: 91.8 fL (ref 80.0–100.0)
Monocytes Absolute: 0.1 10*3/uL (ref 0.1–1.0)
Monocytes Relative: 6 %
Neutro Abs: 1.4 10*3/uL — ABNORMAL LOW (ref 1.7–7.7)
Neutrophils Relative %: 64 %
Platelets: 84 10*3/uL — ABNORMAL LOW (ref 150–400)
RBC: 3.05 MIL/uL — ABNORMAL LOW (ref 3.87–5.11)
RDW: 17 % — ABNORMAL HIGH (ref 11.5–15.5)
WBC: 2.2 10*3/uL — ABNORMAL LOW (ref 4.0–10.5)
nRBC: 0 % (ref 0.0–0.2)

## 2021-08-07 MED ORDER — FENTANYL CITRATE (PF) 100 MCG/2ML IJ SOLN
INTRAMUSCULAR | Status: AC
  Filled 2021-08-07: qty 2

## 2021-08-07 MED ORDER — MIDAZOLAM HCL 2 MG/2ML IJ SOLN
INTRAMUSCULAR | Status: AC
  Filled 2021-08-07: qty 2

## 2021-08-07 MED ORDER — LIDOCAINE HCL 1 % IJ SOLN
INTRAMUSCULAR | Status: AC | PRN
  Administered 2021-08-07: 10 mL via INTRADERMAL

## 2021-08-07 MED ORDER — MIDAZOLAM HCL 2 MG/2ML IJ SOLN
INTRAMUSCULAR | Status: AC | PRN
  Administered 2021-08-07: .5 mg via INTRAVENOUS

## 2021-08-07 MED ORDER — FENTANYL CITRATE (PF) 100 MCG/2ML IJ SOLN
INTRAMUSCULAR | Status: AC | PRN
  Administered 2021-08-07: 25 ug via INTRAVENOUS

## 2021-08-07 MED ORDER — SODIUM CHLORIDE 0.9 % IV SOLN: INTRAVENOUS | Status: DC

## 2021-08-07 MED ORDER — NALOXONE HCL 0.4 MG/ML IJ SOLN
INTRAMUSCULAR | Status: AC
  Filled 2021-08-07: qty 1

## 2021-08-07 MED ORDER — FLUMAZENIL 0.5 MG/5ML IV SOLN
INTRAVENOUS | Status: AC
  Filled 2021-08-07: qty 5

## 2021-08-07 NOTE — Procedures (Signed)
Interventional Radiology Procedure:   Indications: Multiple myeloma  Procedure: CT guided bone marrow biopsy  Findings: 2 aspirates and 1 core from right ilium  Complications: None     EBL: Minimal, less than 10 ml  Plan: Discharge to home in one hour.   Sailor Haughn R. Kenasia Scheller, MD  Pager: [phone]   

## 2021-08-07 NOTE — H&P (Signed)
Referring Physician(s): Katragadda,Sreedhar  Supervising Physician: Markus Daft  Patient Status:  [facility]  Chief Complaint:  "I'm getting a biopsy"  Subjective: Patient known to IR service from right sacral ala lytic lesion biopsy on 05/17/2019.  She has a past medical history significant for paroxysmal atrial fibrillation, T12 compression fracture with kyphoplasty, hypertension, thyroid disease, prior tobacco abuse as well as multiple myeloma and progressive anemia.  She presented to [facility] on 5/12 for CT-guided bone marrow biopsy ,however procedure was canceled due to hemoglobin of 6.4.  She presents again today for CT-guided bone marrow biopsy for further evaluation.  She currently denies fever, headache, chest pain, dyspnea, cough, abdominal/back pain, nausea, vomiting or bleeding.  Past Medical History:  Diagnosis Date   Atrial fibrillation ([place]) 2011   Postop, spontaneous conversion to normal sinus after one hour   Compression fracture 07/24/2009   T12; kyphoplasty   History of echocardiogram 07/2009   EF 65%   Hypertension    Thyroid disease    Tobacco abuse    Past Surgical History:  Procedure Laterality Date   BACK SURGERY     BACK SURGERY  06/06/2015   BREAST EXCISIONAL BIOPSY Left    50 years ago  benign   CHOLECYSTECTOMY N/A 04/25/2015   Procedure: LAPAROSCOPIC CHOLECYSTECTOMY WITH INTRAOPERATIVE CHOLANGIOGRAM;  Surgeon: Mickeal Skinner, MD;  Location: [facility];  Service: General;  Laterality: N/A;   COLONOSCOPY N/A 11/26/2015   Procedure: COLONOSCOPY;  Surgeon: Daneil Dolin, MD;  [facility];  Service: Endoscopy;  Laterality: N/A;  7:30 am   ERCP N/A 04/14/2015   Procedure: ENDOSCOPIC RETROGRADE CHOLANGIOPANCREATOGRAPHY (ERCP) Biliary Sphincterotomy, 10x7 stent placement Dilated bilary system just not well seen;  Surgeon: Rogene Houston, MD;  Location: [facility];  Service: Endoscopy;  Laterality: N/A;   ERCP N/A 06/12/2015   Procedure:  ENDOSCOPIC RETROGRADE CHOLANGIOPANCREATOGRAPHY (ERCP);  Surgeon: Rogene Houston, MD;  [facility];  Service: Endoscopy;  Laterality: N/A;   ESOPHAGOGASTRODUODENOSCOPY N/A 06/12/2015   Procedure: DIAGNOSTIC ESOPHAGOGASTRODUODENOSCOPY (EGD);  Surgeon: Rogene Houston, MD;  [facility];  Service: Endoscopy;  Laterality: N/A;   FEMUR IM NAIL Right 05/11/2019   Procedure: RETROGRADE INTRAMEDULLARY NAIL FEMORAL;  Surgeon: Shona Needles, MD;  Location: [facility];  Service: Orthopedics;  Laterality: Right;   STENT REMOVAL  06/12/2015   Procedure: STENT REMOVAL ;  Surgeon: Rogene Houston, MD;  [facility];  Service: Endoscopy;;      Allergies: Patient has no known allergies.  Medications: Prior to Admission medications   Medication Sig Start Date End Date Taking? Authorizing Provider  acyclovir (ZOVIRAX) 400 MG tablet TAKE (1) TABLET BY MOUTH TWICE DAILY. 05/07/21  Yes Derek Jack, MD  aspirin EC 81 MG tablet Take 81 mg by mouth daily.   Yes [provider]  carboxymethylcellulose (REFRESH PLUS) 0.5 % SOLN Place 1 drop into both eyes in the morning, at noon, in the evening, and at bedtime.   Yes [provider]  Cholecalciferol (VITAMIN D) 125 MCG (5000 UT) CAPS Take 5,000 Units by mouth daily. 05/14/19  Yes McClung, Sarah A, PA-C  dexamethasone (DECADRON) 4 MG tablet TAKE 2 TABLETS BY MOUTH once weekly 06/30/21  Yes Derek Jack, MD  lenalidomide (REVLIMID) 10 MG capsule Take one capsule daily on days 1-14 every 21 days. 08/04/21  Yes Derek Jack, MD  levothyroxine (SYNTHROID) 137 MCG tablet Take 1 tablet (137 mcg total) by mouth daily  before breakfast. 02/17/21  Yes Renato Shin, MD  prednisoLONE acetate (PRED FORTE) 1 % ophthalmic suspension Place 1 drop into the right eye 4 (four) times daily. 08/15/20  Yes [provider]  SSD 1 % cream Apply topically daily. 06/10/21   [provider]     Vital Signs: BP  (!) 141/47   Pulse 65   Temp (!) 97.5 F (36.4 C) (Oral)   Resp 16   Ht 5' 3"  (1.6 m)   Wt 114 lb (51.7 kg)   SpO2 97%   BMI 20.19 kg/m   Physical Exam awake, alert.  Chest with distant but clear breath sounds bilaterally.  Heart with regular rate and rhythm.  Abdomen soft, positive bowel sounds, nontender.  No significant lower extremity edema.  Imaging: No results found.  Labs:  CBC: Recent Labs    07/17/21 0730 07/17/21 1027 07/28/21 1025 08/06/21 1328  WBC 1.9* 2.2* 2.0* 2.8*  HGB 6.4* 6.8* 9.7* 9.4*  HCT 21.0* 21.8* 30.4* 29.5*  PLT 125* 130* 70* 93*    COAGS: No results for input(s): INR, APTT in the last 8760 hours.  BMP: Recent Labs    05/05/21 1150 06/02/21 1352 06/23/21 1304 07/28/21 1025  NA 136 139 136 137  K 3.6 3.6 3.5 3.5  CL 98 98 100 100  CO2 28 30 29 28   GLUCOSE 99 105* 118* 101*  BUN 15 15 20 17   CALCIUM 9.5 9.2 8.7* 8.7*  CREATININE 0.71 0.87 0.83 0.79  GFRNONAA >60 >60 >60 >60    LIVER FUNCTION TESTS: Recent Labs    05/05/21 1150 06/02/21 1352 06/23/21 1304 07/28/21 1025  BILITOT 0.5 0.6 0.4 0.4  AST 13* 14* 16 14*  ALT 9 12 16 14   ALKPHOS 65 80 84 97  PROT 6.6 6.3* 6.3* 6.0*  ALBUMIN 3.8 3.7 3.4* 3.5    Assessment and Plan: Patient known to IR service from right sacral ala lytic lesion biopsy on 05/17/2019.  She has a past medical history significant for paroxysmal atrial fibrillation, T12 compression fracture with kyphoplasty, hypertension, thyroid disease, prior tobacco abuse as well as multiple myeloma and progressive anemia.  She presented to [facility] on 5/12 for CT-guided bone marrow biopsy ,however procedure was canceled due to hemoglobin of 6.4.  She presents again today for CT-guided bone marrow biopsy for further evaluation. Risks and benefits of procedure was discussed with the patient including, but not limited to bleeding, infection, damage to adjacent structures or low yield requiring additional  tests.  All of the questions were answered and there is agreement to proceed.  Consent signed and in chart.  CBC pending today  Electronically Signed: D. Rowe Robert, PA-C 08/07/2021, 8:01 AM   I spent a total of 20 minutes at the the patient's bedside AND on the patient's hospital floor or unit, greater than 50% of which was counseling/coordinating care for CT-guided bone marrow biopsy

## 2021-08-12 LAB — SURGICAL PATHOLOGY

## 2021-08-14 ENCOUNTER — Encounter (HOSPITAL_COMMUNITY): Payer: Self-pay

## 2021-08-18 ENCOUNTER — Encounter (HOSPITAL_COMMUNITY): Payer: Self-pay | Admitting: Hematology

## 2021-08-25 ENCOUNTER — Inpatient Hospital Stay (HOSPITAL_COMMUNITY): Payer: Medicare Other

## 2021-08-25 ENCOUNTER — Encounter (HOSPITAL_COMMUNITY): Payer: Self-pay | Admitting: Hematology

## 2021-08-25 ENCOUNTER — Other Ambulatory Visit (HOSPITAL_COMMUNITY): Payer: Self-pay

## 2021-08-25 ENCOUNTER — Telehealth (HOSPITAL_COMMUNITY): Payer: Self-pay | Admitting: Pharmacy Technician

## 2021-08-25 ENCOUNTER — Inpatient Hospital Stay (HOSPITAL_BASED_OUTPATIENT_CLINIC_OR_DEPARTMENT_OTHER): Payer: Medicare Other | Admitting: Hematology

## 2021-08-25 VITALS — BP 122/47 | HR 77 | Temp 98.8°F | Resp 18 | Ht 62.0 in | Wt 138.4 lb

## 2021-08-25 DIAGNOSIS — Z7961 Long term (current) use of immunomodulator: Secondary | ICD-10-CM | POA: Diagnosis not present

## 2021-08-25 DIAGNOSIS — C9002 Multiple myeloma in relapse: Secondary | ICD-10-CM | POA: Diagnosis not present

## 2021-08-25 DIAGNOSIS — C9000 Multiple myeloma not having achieved remission: Secondary | ICD-10-CM

## 2021-08-25 DIAGNOSIS — Z79890 Hormone replacement therapy (postmenopausal): Secondary | ICD-10-CM | POA: Diagnosis not present

## 2021-08-25 DIAGNOSIS — D649 Anemia, unspecified: Secondary | ICD-10-CM | POA: Diagnosis not present

## 2021-08-25 DIAGNOSIS — Z7952 Long term (current) use of systemic steroids: Secondary | ICD-10-CM | POA: Diagnosis not present

## 2021-08-25 DIAGNOSIS — E876 Hypokalemia: Secondary | ICD-10-CM | POA: Diagnosis not present

## 2021-08-25 LAB — COMPREHENSIVE METABOLIC PANEL
ALT: 12 U/L (ref 0–44)
AST: 14 U/L — ABNORMAL LOW (ref 15–41)
Albumin: 3.4 g/dL — ABNORMAL LOW (ref 3.5–5.0)
Alkaline Phosphatase: 77 U/L (ref 38–126)
Anion gap: 5 (ref 5–15)
BUN: 14 mg/dL (ref 8–23)
CO2: 30 mmol/L (ref 22–32)
Calcium: 9.1 mg/dL (ref 8.9–10.3)
Chloride: 103 mmol/L (ref 98–111)
Creatinine, Ser: 0.74 mg/dL (ref 0.44–1.00)
GFR, Estimated: >60 mL/min (ref >60)
Glucose, Bld: 106 mg/dL — ABNORMAL HIGH (ref 70–99)
Potassium: 3.4 mmol/L — ABNORMAL LOW (ref 3.5–5.1)
Sodium: 138 mmol/L (ref 135–145)
Total Bilirubin: 0.3 mg/dL (ref 0.3–1.2)
Total Protein: 6 g/dL — ABNORMAL LOW (ref 6.5–8.1)

## 2021-08-25 LAB — CBC WITH DIFFERENTIAL/PLATELET
Abs Immature Granulocytes: 0.01 10*3/uL (ref 0.00–0.07)
Basophils Absolute: 0 10*3/uL (ref 0.0–0.1)
Basophils Relative: 0 %
Eosinophils Absolute: 0.1 10*3/uL (ref 0.0–0.5)
Eosinophils Relative: 4 %
HCT: 23.2 % — ABNORMAL LOW (ref 36.0–46.0)
Hemoglobin: 7.3 g/dL — ABNORMAL LOW (ref 12.0–15.0)
Immature Granulocytes: 1 %
Lymphocytes Relative: 39 %
Lymphs Abs: 0.7 10*3/uL (ref 0.7–4.0)
MCH: 29.7 pg (ref 26.0–34.0)
MCHC: 31.5 g/dL (ref 30.0–36.0)
MCV: 94.3 fL (ref 80.0–100.0)
Monocytes Absolute: 0.1 10*3/uL (ref 0.1–1.0)
Monocytes Relative: 4 %
Neutro Abs: 1 10*3/uL — ABNORMAL LOW (ref 1.7–7.7)
Neutrophils Relative %: 52 %
Platelets: 96 10*3/uL — ABNORMAL LOW (ref 150–400)
RBC: 2.46 MIL/uL — ABNORMAL LOW (ref 3.87–5.11)
RDW: 19.2 % — ABNORMAL HIGH (ref 11.5–15.5)
WBC: 1.8 10*3/uL — ABNORMAL LOW (ref 4.0–10.5)
nRBC: 0 % (ref 0.0–0.2)

## 2021-08-25 LAB — SAMPLE TO BLOOD BANK

## 2021-08-25 LAB — IRON AND TIBC
Iron: 50 ug/dL (ref 28–170)
Saturation Ratios: 15 % (ref 10.4–31.8)
TIBC: 337 ug/dL (ref 250–450)
UIBC: 287 ug/dL

## 2021-08-25 LAB — LACTATE DEHYDROGENASE: LDH: 99 U/L (ref 98–192)

## 2021-08-25 LAB — FERRITIN: Ferritin: 264 ng/mL (ref 11–307)

## 2021-08-25 MED ORDER — DENOSUMAB 120 MG/1.7ML ~~LOC~~ SOLN
120.0000 mg | Freq: Once | SUBCUTANEOUS | Status: AC
  Administered 2021-08-25: 120 mg via SUBCUTANEOUS
  Filled 2021-08-25: qty 1.7

## 2021-08-25 MED ORDER — POMALIDOMIDE 2 MG PO CAPS
2.0000 mg | ORAL_CAPSULE | Freq: Every day | ORAL | 0 refills | Status: DC
  Filled 2021-08-25: qty 21, 21d supply, fill #0

## 2021-08-25 NOTE — Progress Notes (Signed)
ON PATHWAY REGIMEN - Multiple Myeloma and Other Plasma Cell Dyscrasias  No Change  Continue With Treatment as Ordered.  Original Decision Date/Time: 06/07/2019 17:20     A cycle is every 21 days:     Bortezomib      Lenalidomide      Dexamethasone   **Always confirm dose/schedule in your pharmacy ordering system**  Patient Characteristics: Multiple Myeloma, Newly Diagnosed, Transplant Ineligible or Refused, High Risk Disease Classification: Multiple Myeloma R-ISS Staging: Unknown Therapeutic Status: Newly Diagnosed Is Patient Eligible for Transplant<= Transplant Ineligible or Refused Risk Status: High Risk Intent of Therapy: Non-Curative / Palliative Intent, Discussed with Patient

## 2021-08-25 NOTE — Progress Notes (Signed)
DISCONTINUE ON PATHWAY REGIMEN - Multiple Myeloma and Other Plasma Cell Dyscrasias     A cycle is every 21 days:     Bortezomib      Lenalidomide      Dexamethasone   **Always confirm dose/schedule in your pharmacy ordering system**  REASON: Disease Progression PRIOR TREATMENT: [id number]: VRd (Bortezomib 1.3 mg/m2 SUBQ D1, 8, 15 + Lenalidomide 25 mg + Dexamethasone 40 mg) q21 Days x 8 Cycles TREATMENT RESPONSE: Progressive Disease (PD)  START ON PATHWAY REGIMEN - Multiple Myeloma and Other Plasma Cell Dyscrasias     Cycles 1 and 2: A cycle is every 28 days:     Pomalidomide      Dexamethasone      Daratumumab and hyaluronidase-fihj    Cycles 3 through 6: A cycle is every 28 days:     Pomalidomide      Dexamethasone      Dexamethasone      Daratumumab and hyaluronidase-fihj    Cycles 7 and beyond: A cycle is every 28 days:     Pomalidomide      Dexamethasone      Dexamethasone      Daratumumab and hyaluronidase-fihj   **Always confirm dose/schedule in your pharmacy ordering system**  Patient Characteristics: Multiple Myeloma, Relapsed / Refractory, Second through Fourth Lines of Therapy, Fit or Candidate for Triplet Therapy, Lenalidomide-Refractory or Lenalidomide-based Regimen Not Preferred, Candidate for Anti-CD38 Antibody Disease Classification: Multiple Myeloma R-ISS Staging: Unknown Therapeutic Status: Relapsed Line of Therapy: Second Line Anti-CD38 Antibody Candidacy: Candidate for Anti-CD38 Antibody Lenalidomide-based Regimen Preference/Candidacy: Lenalidomide-Refractory Intent of Therapy: Non-Curative / Palliative Intent, Discussed with Patient 

## 2021-08-25 NOTE — Progress Notes (Signed)
[facility], [place] [address]   CLINIC:  Medical Oncology/Hematology  PCP:  [name], [place] / [place] [address]  [phone]  REASON FOR VISIT:  Follow-up for IgA kappa plasma cell myeloma  PRIOR THERAPY: Velcade x 9 cycles from 07/04/2019 to 01/10/2020  CURRENT THERAPY: Revlimid 15 mg 2/3 weeks; Delton See monthly  INTERVAL HISTORY:  [name], a 81 y.o. female, returns for routine follow-up for her IgA kappa plasma cell myeloma. Courtney Arnold was last seen on 06/30/2021.  Today she reports feeling good. She denies neuropathy. She reports sinus congestion with clear nasal drainage. She denies fatigue. She denies new pains and n/v/d/c. Her appetite is good.   REVIEW OF SYSTEMS:  Review of Systems  Constitutional:  Negative for appetite change and fatigue.  Gastrointestinal:  Negative for constipation, diarrhea, nausea and vomiting.  Neurological:  Negative for numbness.  All other systems reviewed and are negative.   PAST MEDICAL/SURGICAL HISTORY:  Past Medical History:  Diagnosis Date   Atrial fibrillation ([place]) 2011   Postop, spontaneous conversion to normal sinus after one hour   Compression fracture 07/24/2009   T12; kyphoplasty   History of echocardiogram 07/2009   EF 65%   Hypertension    Thyroid disease    Tobacco abuse    Past Surgical History:  Procedure Laterality Date   BACK SURGERY     BACK SURGERY  06/06/2015   BREAST EXCISIONAL BIOPSY Left    50 years ago  benign   CHOLECYSTECTOMY N/A 04/25/2015   Procedure: LAPAROSCOPIC CHOLECYSTECTOMY WITH INTRAOPERATIVE CHOLANGIOGRAM;  Surgeon: Mickeal Skinner, MD;  Location: [facility];  Service: General;  Laterality: N/A;   COLONOSCOPY N/A 11/26/2015   Procedure: COLONOSCOPY;  Surgeon: Daneil Dolin, MD;  [facility];  Service: Endoscopy;  Laterality: N/A;  7:30 am   ERCP N/A 04/14/2015   Procedure: ENDOSCOPIC RETROGRADE  CHOLANGIOPANCREATOGRAPHY (ERCP) Biliary Sphincterotomy, 10x7 stent placement Dilated bilary system just not well seen;  Surgeon: Rogene Houston, MD;  Location: [facility];  Service: Endoscopy;  Laterality: N/A;   ERCP N/A 06/12/2015   Procedure: ENDOSCOPIC RETROGRADE CHOLANGIOPANCREATOGRAPHY (ERCP);  Surgeon: Rogene Houston, MD;  [facility];  Service: Endoscopy;  Laterality: N/A;   ESOPHAGOGASTRODUODENOSCOPY N/A 06/12/2015   Procedure: DIAGNOSTIC ESOPHAGOGASTRODUODENOSCOPY (EGD);  Surgeon: Rogene Houston, MD;  [facility];  Service: Endoscopy;  Laterality: N/A;   FEMUR IM NAIL Right 05/11/2019   Procedure: RETROGRADE INTRAMEDULLARY NAIL FEMORAL;  Surgeon: Shona Needles, MD;  Location: [facility];  Service: Orthopedics;  Laterality: Right;   STENT REMOVAL  06/12/2015   Procedure: STENT REMOVAL ;  Surgeon: Rogene Houston, MD;  [facility];  Service: Endoscopy;;    SOCIAL HISTORY:  Social History   Socioeconomic History   Marital status: Widowed    Spouse name: Not on file   Number of children: 2   Years of education: Not on file   Highest education level: Not on file  Occupational History   Occupation: retired  Tobacco Use   Smoking status: Former    Packs/day: 0.15    Years: 20.00    Total pack years: 3.00    Types: Cigarettes    Quit date: 03/08/2013    Years since quitting: 8.4   Smokeless tobacco: Never  Vaping Use   Vaping Use: Never used  Substance and Sexual Activity   Alcohol use: No  Alcohol/week: 0.0 standard drinks of alcohol   Drug use: No   Sexual activity: Not Currently  Other Topics Concern   Not on file  Social History Narrative   Active in gardens and does yard work.   Social Determinants of Health   Financial Resource Strain: Low Risk  (05/08/2019)   Overall Financial Resource Strain (CARDIA)    Difficulty of Paying Living Expenses: Not hard at all  Food Insecurity: No Food Insecurity (12/28/2019)   Hunger Vital Sign    Worried  About Running Out of Food in the Last Year: Never true    Ran Out of Food in the Last Year: Never true  Transportation Needs: No Transportation Needs (06/14/2019)   PRAPARE - Hydrologist (Medical): No    Lack of Transportation (Non-Medical): No  Physical Activity: Inactive (05/08/2019)   Exercise Vital Sign    Days of Exercise per Week: 0 days    Minutes of Exercise per Session: 0 min  Stress: No Stress Concern Present (05/08/2019)   [place]    Feeling of Stress : Not at all  Social Connections: Moderately Isolated (02/06/2020)   Social Connection and Isolation Panel [NHANES]    Frequency of Communication with Friends and Family: More than three times a week    Frequency of Social Gatherings with Friends and Family: More than three times a week    Attends Religious Services: More than 4 times per year    Active Member of [employer] or Organizations: No    Attends Archivist Meetings: Never    Marital Status: Widowed  Intimate Partner Violence: Not At Risk (05/08/2019)   Humiliation, Afraid, Rape, and Kick questionnaire    Fear of Current or Ex-Partner: No    Emotionally Abused: No    Physically Abused: No    Sexually Abused: No    FAMILY HISTORY:  Family History  Problem Relation Age of Onset   Heart attack Father    Stroke Father    Breast cancer Sister    Thyroid disease Neg Hx    Colon cancer Neg Hx     CURRENT MEDICATIONS:  Current Outpatient Medications  Medication Sig Dispense Refill   acyclovir (ZOVIRAX) 400 MG tablet TAKE (1) TABLET BY MOUTH TWICE DAILY. 60 tablet 6   aspirin EC 81 MG tablet Take 81 mg by mouth daily.     carboxymethylcellulose (REFRESH PLUS) 0.5 % SOLN Place 1 drop into both eyes in the morning, at noon, in the evening, and at bedtime.     Cholecalciferol (VITAMIN D) 125 MCG (5000 UT) CAPS Take 5,000 Units by mouth daily. 90 capsule 1   dexamethasone  (DECADRON) 4 MG tablet TAKE 2 TABLETS BY MOUTH once weekly 30 tablet 3   levothyroxine (SYNTHROID) 137 MCG tablet Take 1 tablet (137 mcg total) by mouth daily before breakfast. 90 tablet 3   pomalidomide (POMALYST) 2 MG capsule Take 1 capsule (2 mg total) by mouth daily. 21 capsule 0   prednisoLONE acetate (PRED FORTE) 1 % ophthalmic suspension Place 1 drop into the right eye 4 (four) times daily.     SSD 1 % cream Apply topically daily.     No current facility-administered medications for this visit.    ALLERGIES:  No Known Allergies  PHYSICAL EXAM:  Performance status (ECOG): 1 - Symptomatic but completely ambulatory  Vitals:   08/25/21 1337  BP: (!) 122/47  Pulse: 77  Resp: 18  Temp: 98.8 F (37.1 C)  SpO2: 98%   Wt Readings from Last 3 Encounters:  08/25/21 138 lb 6.4 oz (62.8 kg)  08/07/21 114 lb (51.7 kg)  06/30/21 142 lb 10.2 oz (64.7 kg)   Physical Exam Vitals reviewed.  Constitutional:      Appearance: Normal appearance.  Cardiovascular:     Rate and Rhythm: Normal rate and regular rhythm.     Pulses: Normal pulses.     Heart sounds: Normal heart sounds.  Pulmonary:     Effort: Pulmonary effort is normal.     Breath sounds: Normal breath sounds.  Neurological:     General: No focal deficit present.     Mental Status: She is alert and oriented to person, place, and time.  Psychiatric:        Mood and Affect: Mood normal.        Behavior: Behavior normal.     LABORATORY DATA:  I have reviewed the labs as listed.     Latest Ref Rng & Units 08/07/2021    7:12 AM 08/06/2021    1:28 PM 07/28/2021   10:25 AM  CBC  WBC 4.0 - 10.5 K/uL 2.2  2.8  2.0   Hemoglobin 12.0 - 15.0 g/dL 8.8  9.4  9.7   Hematocrit 36.0 - 46.0 % 28.0  29.5  30.4   Platelets 150 - 400 K/uL 84  93  70       Latest Ref Rng & Units 08/25/2021    1:03 PM 07/28/2021   10:25 AM 06/23/2021    1:04 PM  CMP  Glucose 70 - 99 mg/dL 106  101  118   BUN 8 - 23 mg/dL _0 Creatinine 0.44  - 1.00 mg/dL 0.74  0.79  0.83   Sodium 135 - 145 mmol/L 138  137  136   Potassium 3.5 - 5.1 mmol/L 3.4  3.5  3.5   Chloride 98 - 111 mmol/L 103  100  100   CO2 22 - 32 mmol/L _1 Calcium 8.9 - 10.3 mg/dL 9.1  8.7  8.7   Total Protein 6.5 - 8.1 g/dL 6.0  6.0  6.3   Total Bilirubin 0.3 - 1.2 mg/dL 0.3  0.4  0.4   Alkaline Phos 38 - 126 U/L 77  97  84   AST 15 - 41 U/L _2 ALT 0 - 44 U/L _3 Component Value Date/Time   RBC 3.05 (L) 08/07/2021 0712   MCV 91.8 08/07/2021 0712   MCH 28.9 08/07/2021 0712   MCHC 31.4 08/07/2021 0712   RDW 17.0 (H) 08/07/2021 0712   LYMPHSABS 0.6 (L) 08/07/2021 0712   MONOABS 0.1 08/07/2021 0712   EOSABS 0.0 08/07/2021 0712   BASOSABS 0.0 08/07/2021 4268    DIAGNOSTIC IMAGING:  I have independently reviewed the scans and discussed with the patient. CT BONE MARROW BIOPSY & ASPIRATION  Result Date: 08/07/2021 INDICATION: 81 year old with multiple myeloma. EXAM: CT GUIDED BONE MARROW ASPIRATES AND BIOPSY Physician: Stephan Minister. Henn, MD MEDICATIONS: Moderate sedation ANESTHESIA/SEDATION: Moderate (conscious) sedation was employed during this procedure. A total of Versed 1.97m and fentanyl 50 mcg was administered intravenously at the order of the provider performing the procedure. Total intra-service moderate sedation time: 10 minutes. Patient's level of consciousness and vital signs were monitored continuously by radiology nurse  throughout the procedure under the supervision of the provider performing the procedure. COMPLICATIONS: None immediate. PROCEDURE: The procedure was explained to the patient. The risks and benefits of the procedure were discussed and the patient's questions were addressed. Informed consent was obtained from the patient. The patient was placed prone on CT table. Images of the pelvis were obtained. The right side of back was prepped and draped in sterile fashion. The skin and right posterior ilium were anesthetized  with 1% lidocaine. 11 gauge bone needle was directed into the right ilium with CT guidance. Two aspirates and one core biopsy were obtained. Bandage placed over the puncture site. RADIATION DOSE REDUCTION: This exam was performed according to the departmental dose-optimization program which includes automated exposure control, adjustment of the mA and/or kV according to patient size and/or use of iterative reconstruction technique. FINDINGS: Lucent lesion involving the right sacral ala has decreased in size since the biopsy on 05/17/2019 and suggestive for post treatment changes. Biopsy needle directed into the posterior right ilium. IMPRESSION: CT guided bone marrow aspiration and core biopsy. Electronically Signed   By: Markus Daft M.D.   On: 08/07/2021 14:47   CT Biopsy  Result Date: 08/07/2021 INDICATION: 81 year old with multiple myeloma. EXAM: CT GUIDED BONE MARROW ASPIRATES AND BIOPSY Physician: Stephan Minister. Henn, MD MEDICATIONS: Moderate sedation ANESTHESIA/SEDATION: Moderate (conscious) sedation was employed during this procedure. A total of Versed 1.39m and fentanyl 50 mcg was administered intravenously at the order of the provider performing the procedure. Total intra-service moderate sedation time: 10 minutes. Patient's level of consciousness and vital signs were monitored continuously by radiology nurse throughout the procedure under the supervision of the provider performing the procedure. COMPLICATIONS: None immediate. PROCEDURE: The procedure was explained to the patient. The risks and benefits of the procedure were discussed and the patient's questions were addressed. Informed consent was obtained from the patient. The patient was placed prone on CT table. Images of the pelvis were obtained. The right side of back was prepped and draped in sterile fashion. The skin and right posterior ilium were anesthetized with 1% lidocaine. 11 gauge bone needle was directed into the right ilium with CT guidance. Two  aspirates and one core biopsy were obtained. Bandage placed over the puncture site. RADIATION DOSE REDUCTION: This exam was performed according to the departmental dose-optimization program which includes automated exposure control, adjustment of the mA and/or kV according to patient size and/or use of iterative reconstruction technique. FINDINGS: Lucent lesion involving the right sacral ala has decreased in size since the biopsy on 05/17/2019 and suggestive for post treatment changes. Biopsy needle directed into the posterior right ilium. IMPRESSION: CT guided bone marrow aspiration and core biopsy. Electronically Signed   By: AMarkus DaftM.D.   On: 08/07/2021 14:47     ASSESSMENT:  1.  IgA kappa plasma cell myeloma: -IgA kappa plasma cell myeloma diagnosed on right sacral bone biopsy on 05/17/2019.  SPEP did not show M spike.  Immunofixation shows IgA kappa.  Kappa light chains 42.8, lambda light chains at 9.9, ratio 4.32.  Beta-2 microglobulin 3.7. -PET scan on 06/04/2019 showed expansile lytic lesion involving the right sacral wing.  Lytic lesion involving T10 vertebral body.  Left seventh rib lesion.  Increased uptake in a pathological fracture involving distal aspect of the right femur. 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 -RVD started on 06/26/2019.  Revlimid 15 mg 2 weeks on/1 week off. -  Velcade discontinued on 01/10/2020. -She is taking Revlimid 15 mg 2 weeks on 1 week off, with dexamethasone 20 mg on weeks of Revlimid and 10 mg while she is off Revlimid.  Revlimid dose further reduced to 10 mg 3 weeks on/1 week off.  She has developed progressive anemia. - BMBX (08/07/2021): 38% plasma cells in the marrow.  Cytogenetics was normal.  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 - DPD regimen started on   PLAN:  1.  Relapsed IgA kappa plasma cell myeloma: - We have recommended bone marrow biopsy as she was getting  progressively anemic, requiring transfusion. - We discussed bone marrow biopsy results from 08/07/2021.  There is 38% plasma cells.  FISH panel shows gain of 1 q. which is a high risk feature along with monosomy 13. - I have recommended switching treatments at this time.  We will discontinue lenalidomide. - Recommend DPD regimen containing Darzalex, Pomalyst and dexamethasone every 28-day cycle. - We will start Pomalyst at 2 mg 3 weeks on/1 week off.  Dexamethasone will be 20 mg weekly.  We discussed side effects in detail.   2.  Hypokalemia: - Continue OTC potassium daily.  Potassium today is 3.4.   3.  Normocytic anemia: - Previous nutritional deficiency work-up is negative.  Anemia from infiltration by myeloma.  Ferritin is 264 and percent saturation is 15.  Hemoglobin today 7.3.  We will closely monitor for possible transfusions.   4.  ID prophylaxis: - Continue acyclovir twice daily.  Continue on aspirin 81 mg for thromboprophylaxis.   5.  Myeloma bone disease: - Calcium is 9.1.  Continue denosumab monthly. - Continue calcium supplements.  Orders placed this encounter:  No orders of the defined types were placed in this encounter.    Derek Jack, MD [place] [phone]   I, Thana Ates, am acting as a scribe for Dr. Derek Jack.  I, Derek Jack MD, have reviewed the above documentation for accuracy and completeness, and I agree with the above.

## 2021-08-25 NOTE — Patient Instructions (Addendum)
[place] at [facility] Discharge Instructions   You were seen and examined today by Dr. Delton Coombes.  He reviewed the results of your bone marrow biopsy. It shows that your myeloma has come back. The Revlimid by itself is not doing the job anymore to control your myeloma. We will stop the Revlimid. We will prescribe a pill for you called Pomalyst. Dr. Raliegh Ip also discussed adding an injection called Darzalex. You receive this injection weekly for a period of time, then every other week, then monthly. The Pomalyst will be taken 3 weeks on and one week off. We will also need to increase the dose of your steroids once you start treatment.   Return as scheduled.    Thank you for choosing [place] at [facility] to provide your oncology and hematology care.  To afford each patient quality time with our provider, please arrive at least 15 minutes before your scheduled appointment time.   If you have a lab appointment with the [facility] please come in thru the Main Entrance and check in at the main information desk.  You need to re-schedule your appointment should you arrive 10 or more minutes late.  We strive to give you quality time with our providers, and arriving late affects you and other patients whose appointments are after yours.  Also, if you no show three or more times for appointments you may be dismissed from the clinic at the providers discretion.     Again, thank you for choosing [facility].  Our hope is that these requests will decrease the amount of time that you wait before being seen by our physicians.       _____________________________________________________________  Should you have questions after your visit to [facility], please contact our office at [phone] and follow the prompts.  Our office hours are 8:00 a.m. and 4:30 p.m. Monday - Friday.  Please note that voicemails left after 4:00 p.m. may  not be returned until the following business day.  We are closed weekends and major holidays.  You do have access to a nurse 24-7, just call the main number to the clinic [phone] and do not press any options, hold on the line and a nurse will answer the phone.    For prescription refill requests, have your pharmacy contact our office and allow 72 hours.    Due to Covid, you will need to wear a mask upon entering the hospital. If you do not have a mask, a mask will be given to you at the Main Entrance upon arrival. For doctor visits, patients may have 1 support person age 44 or older with them. For treatment visits, patients can not have anyone with them due to social distancing guidelines and our immunocompromised population.

## 2021-08-26 ENCOUNTER — Telehealth (HOSPITAL_COMMUNITY): Payer: Self-pay | Admitting: Pharmacist

## 2021-08-26 ENCOUNTER — Other Ambulatory Visit (HOSPITAL_COMMUNITY): Payer: Self-pay

## 2021-08-26 DIAGNOSIS — C9000 Multiple myeloma not having achieved remission: Secondary | ICD-10-CM

## 2021-08-26 LAB — KAPPA/LAMBDA LIGHT CHAINS
Kappa free light chain: 22.2 mg/L — ABNORMAL HIGH (ref 3.3–19.4)
Kappa, lambda light chain ratio: 3.47 — ABNORMAL HIGH (ref 0.26–1.65)
Lambda free light chains: 6.4 mg/L (ref 5.7–26.3)

## 2021-08-26 MED ORDER — POMALIDOMIDE 2 MG PO CAPS: 2.0000 mg | ORAL_CAPSULE | Freq: Every day | ORAL | 0 refills | Status: DC

## 2021-08-26 NOTE — Telephone Encounter (Signed)
Oral Oncology Patient Advocate Encounter  Prior Authorization for Pomalyst has been approved.    PA# [id number] Effective dates: 08/25/21 through 03/07/22  Patients co-pay is $1127.46.   Obtained a grant through [employer] to cover copay.  Oral Oncology Clinic will continue to follow.   Courtney Arnold Patient [place] Phone [phone] Fax [phone] 08/26/2021 11:52 AM

## 2021-08-26 NOTE — Telephone Encounter (Signed)
Oral Oncology Pharmacist Encounter  Received new prescription for Pomalyst (pomalidomide) for the treatment of relapsed IgA kappa multiple myeloma in conjunction with daratumumab and dexamethasone, planned duration until disease progression or unacceptable drug toxicity.  CMP from 08/25/21 assessed, no relevant lab abnormalities. Prescription dose and frequency assessed.   Current medication list in Epic reviewed, no DDIs with pomalidomide identified.  Evaluated chart and no patient barriers to medication adherence identified.   Prescription has been e-scribed to the [facility] for benefits analysis and approval.  Oral Oncology Clinic will continue to follow for insurance authorization, copayment issues, initial counseling and start date.   Darl Pikes, PharmD, BCPS, BCOP, CPP Hematology/Oncology Clinical Pharmacist Practitioner [place]/[facility] Oral [facility] [phone]  08/26/2021 9:51 AM

## 2021-08-26 NOTE — Telephone Encounter (Signed)
Pomalyst sent per verbal order from Dr. Delton Coombes

## 2021-08-26 NOTE — Telephone Encounter (Signed)
Oral Oncology Patient Advocate Encounter   Received notification from [employer] that prior authorization for Pomalyst is required.   PA submitted on CoverMyMeds Key [id number] Status is pending   Oral Oncology Clinic will continue to follow.  [place] Patient [place] Phone [phone] Fax [phone] 08/26/2021 11:50 AM

## 2021-08-27 LAB — PROTEIN ELECTROPHORESIS, SERUM
A/G Ratio: 1.3 (ref 0.7–1.7)
Albumin ELP: 3.1 g/dL (ref 2.9–4.4)
Alpha-1-Globulin: 0.3 g/dL (ref 0.0–0.4)
Alpha-2-Globulin: 0.7 g/dL (ref 0.4–1.0)
Beta Globulin: 1.2 g/dL (ref 0.7–1.3)
Gamma Globulin: 0.2 g/dL — ABNORMAL LOW (ref 0.4–1.8)
Globulin, Total: 2.4 g/dL (ref 2.2–3.9)
Total Protein ELP: 5.5 g/dL — ABNORMAL LOW (ref 6.0–8.5)

## 2021-08-28 DIAGNOSIS — H26493 Other secondary cataract, bilateral: Secondary | ICD-10-CM | POA: Diagnosis not present

## 2021-08-28 DIAGNOSIS — H353132 Nonexudative age-related macular degeneration, bilateral, intermediate dry stage: Secondary | ICD-10-CM | POA: Diagnosis not present

## 2021-08-28 DIAGNOSIS — Z961 Presence of intraocular lens: Secondary | ICD-10-CM | POA: Diagnosis not present

## 2021-08-31 LAB — IMMUNOFIXATION ELECTROPHORESIS
IgA: 366 mg/dL (ref 64–422)
IgG (Immunoglobin G), Serum: 212 mg/dL — ABNORMAL LOW (ref 586–1602)
IgM (Immunoglobulin M), Srm: 8 mg/dL — ABNORMAL LOW (ref 26–217)
Total Protein ELP: 5.7 g/dL — ABNORMAL LOW (ref 6.0–8.5)

## 2021-09-03 ENCOUNTER — Inpatient Hospital Stay (HOSPITAL_COMMUNITY): Payer: Medicare Other

## 2021-09-03 ENCOUNTER — Other Ambulatory Visit (HOSPITAL_COMMUNITY): Payer: Self-pay

## 2021-09-03 DIAGNOSIS — D649 Anemia, unspecified: Secondary | ICD-10-CM

## 2021-09-03 DIAGNOSIS — Z79890 Hormone replacement therapy (postmenopausal): Secondary | ICD-10-CM | POA: Diagnosis not present

## 2021-09-03 DIAGNOSIS — Z7961 Long term (current) use of immunomodulator: Secondary | ICD-10-CM | POA: Diagnosis not present

## 2021-09-03 DIAGNOSIS — E876 Hypokalemia: Secondary | ICD-10-CM | POA: Diagnosis not present

## 2021-09-03 DIAGNOSIS — C9000 Multiple myeloma not having achieved remission: Secondary | ICD-10-CM

## 2021-09-03 DIAGNOSIS — C9002 Multiple myeloma in relapse: Secondary | ICD-10-CM | POA: Diagnosis not present

## 2021-09-03 DIAGNOSIS — Z7952 Long term (current) use of systemic steroids: Secondary | ICD-10-CM | POA: Diagnosis not present

## 2021-09-03 LAB — TYPE AND SCREEN
ABO/RH(D): O POS
Antibody Screen: NEGATIVE

## 2021-09-03 LAB — CBC WITH DIFFERENTIAL/PLATELET
Abs Immature Granulocytes: 0 10*3/uL (ref 0.00–0.07)
Basophils Absolute: 0 10*3/uL (ref 0.0–0.1)
Basophils Relative: 0 %
Eosinophils Absolute: 0.1 10*3/uL (ref 0.0–0.5)
Eosinophils Relative: 3 %
HCT: 23.2 % — ABNORMAL LOW (ref 36.0–46.0)
Hemoglobin: 7.2 g/dL — ABNORMAL LOW (ref 12.0–15.0)
Immature Granulocytes: 0 %
Lymphocytes Relative: 59 %
Lymphs Abs: 1.1 10*3/uL (ref 0.7–4.0)
MCH: 29.5 pg (ref 26.0–34.0)
MCHC: 30.6 g/dL (ref 30.0–36.0)
MCV: 96.3 fL (ref 80.0–100.0)
Monocytes Absolute: 0.3 10*3/uL (ref 0.1–1.0)
Monocytes Relative: 15 %
Neutro Abs: 0.4 10*3/uL — CL (ref 1.7–7.7)
Neutrophils Relative %: 23 %
Platelets: 80 10*3/uL — ABNORMAL LOW (ref 150–400)
RBC: 2.41 MIL/uL — ABNORMAL LOW (ref 3.87–5.11)
RDW: 19.7 % — ABNORMAL HIGH (ref 11.5–15.5)
WBC: 1.9 10*3/uL — ABNORMAL LOW (ref 4.0–10.5)
nRBC: 0 % (ref 0.0–0.2)

## 2021-09-03 LAB — SAMPLE TO BLOOD BANK

## 2021-09-03 NOTE — Telephone Encounter (Signed)
Per Marlowe Kays at [facility] was delivered on 09/01/21.  [place] Patient [place] Phone [phone] Fax [phone] 09/03/2021 1:14 PM

## 2021-09-03 NOTE — Progress Notes (Unsigned)
CRITICAL VALUE ALERT Critical value received:  WBC 0.4 Date of notification:  09-03-21 Time of notification: 0927 Critical value read back:  Yes.   Nurse who received alert:  C. Jermaine Tholl RN MD notified time and response:  [phone]

## 2021-09-03 NOTE — Progress Notes (Signed)
MD made aware of critical WBC count, no new orders pertaining to that. Hemoglobin 7.2 today. Patient states she feels great,denies any SOB or fatigue. MD aware. No blood needed today per MD.   discharged home from clinic ambulatory. Follow up as scheduled.

## 2021-09-04 LAB — PRETREATMENT RBC PHENOTYPE

## 2021-09-09 ENCOUNTER — Inpatient Hospital Stay (HOSPITAL_COMMUNITY): Payer: Medicare Other | Attending: Hematology

## 2021-09-09 DIAGNOSIS — Z5112 Encounter for antineoplastic immunotherapy: Secondary | ICD-10-CM | POA: Diagnosis not present

## 2021-09-09 DIAGNOSIS — C9002 Multiple myeloma in relapse: Secondary | ICD-10-CM | POA: Diagnosis not present

## 2021-09-09 DIAGNOSIS — Z79899 Other long term (current) drug therapy: Secondary | ICD-10-CM | POA: Diagnosis not present

## 2021-09-09 DIAGNOSIS — D649 Anemia, unspecified: Secondary | ICD-10-CM | POA: Insufficient documentation

## 2021-09-09 DIAGNOSIS — C9000 Multiple myeloma not having achieved remission: Secondary | ICD-10-CM

## 2021-09-09 LAB — CBC WITH DIFFERENTIAL/PLATELET
Abs Immature Granulocytes: 0.01 10*3/uL (ref 0.00–0.07)
Basophils Absolute: 0 10*3/uL (ref 0.0–0.1)
Basophils Relative: 0 %
Eosinophils Absolute: 0.1 10*3/uL (ref 0.0–0.5)
Eosinophils Relative: 5 %
HCT: 22 % — ABNORMAL LOW (ref 36.0–46.0)
Hemoglobin: 6.8 g/dL — CL (ref 12.0–15.0)
Immature Granulocytes: 1 %
Lymphocytes Relative: 48 %
Lymphs Abs: 1 10*3/uL (ref 0.7–4.0)
MCH: 29.6 pg (ref 26.0–34.0)
MCHC: 30.9 g/dL (ref 30.0–36.0)
MCV: 95.7 fL (ref 80.0–100.0)
Monocytes Absolute: 0.2 10*3/uL (ref 0.1–1.0)
Monocytes Relative: 12 %
Neutro Abs: 0.7 10*3/uL — ABNORMAL LOW (ref 1.7–7.7)
Neutrophils Relative %: 34 %
Platelets: 76 10*3/uL — ABNORMAL LOW (ref 150–400)
RBC: 2.3 MIL/uL — ABNORMAL LOW (ref 3.87–5.11)
RDW: 19.2 % — ABNORMAL HIGH (ref 11.5–15.5)
WBC: 2 10*3/uL — ABNORMAL LOW (ref 4.0–10.5)
nRBC: 0 % (ref 0.0–0.2)

## 2021-09-09 LAB — PREPARE RBC (CROSSMATCH)

## 2021-09-09 LAB — SAMPLE TO BLOOD BANK

## 2021-09-09 NOTE — Progress Notes (Unsigned)
CRITICAL VALUE ALERT Critical value received:  HGB 6.8 Date of notification:  09/09/2021 Time of notification: 11:00 am Critical value read back:  Yes.   Nurse who received alert:  B. Ashaki Frosch RN MD notified time and response:  [place]. Standing orders. Patient to receive one unit of blood 09-11-2021.

## 2021-09-11 ENCOUNTER — Inpatient Hospital Stay (HOSPITAL_COMMUNITY): Payer: Medicare Other

## 2021-09-11 DIAGNOSIS — Z5112 Encounter for antineoplastic immunotherapy: Secondary | ICD-10-CM | POA: Diagnosis not present

## 2021-09-11 DIAGNOSIS — Z79899 Other long term (current) drug therapy: Secondary | ICD-10-CM | POA: Diagnosis not present

## 2021-09-11 DIAGNOSIS — C9000 Multiple myeloma not having achieved remission: Secondary | ICD-10-CM

## 2021-09-11 DIAGNOSIS — D649 Anemia, unspecified: Secondary | ICD-10-CM | POA: Diagnosis not present

## 2021-09-11 DIAGNOSIS — C9002 Multiple myeloma in relapse: Secondary | ICD-10-CM | POA: Diagnosis not present

## 2021-09-11 MED ORDER — DIPHENHYDRAMINE HCL 25 MG PO CAPS
25.0000 mg | ORAL_CAPSULE | Freq: Once | ORAL | Status: AC
  Administered 2021-09-11: 25 mg via ORAL
  Filled 2021-09-11: qty 1

## 2021-09-11 MED ORDER — ACETAMINOPHEN 325 MG PO TABS
650.0000 mg | ORAL_TABLET | Freq: Once | ORAL | Status: AC
  Administered 2021-09-11: 650 mg via ORAL
  Filled 2021-09-11: qty 2

## 2021-09-11 MED ORDER — SODIUM CHLORIDE 0.9% IV SOLUTION
250.0000 mL | Freq: Once | INTRAVENOUS | Status: AC
  Administered 2021-09-11: 250 mL via INTRAVENOUS

## 2021-09-11 NOTE — Progress Notes (Signed)
Patient presents today for blood transfusion.  Patient is in satisfactory condition with no complaints voiced.  Vital signs are stable.  Hemoglobin on 09/09/21 was 6.8.  Patient will receive one unit of PRBC today.    Patient tolerated transfusion well with no complaints voiced.  Patient left ambulatory in stable condition.  Vital signs stable at discharge.  Follow up as scheduled.

## 2021-09-11 NOTE — Patient Instructions (Signed)
[facility]  Discharge Instructions: Thank you for choosing [facility] to provide your oncology and hematology care.  If you have a lab appointment with the Cancer Center, please come in thru the Main Entrance and check in at the main information desk.  Wear comfortable clothing and clothing appropriate for easy access to any Portacath or PICC line.   We strive to give you quality time with your provider. You may need to reschedule your appointment if you arrive late (15 or more minutes).  Arriving late affects you and other patients whose appointments are after yours.  Also, if you miss three or more appointments without notifying the office, you may be dismissed from the clinic at the provider's discretion.      For prescription refill requests, have your pharmacy contact our office and allow 72 hours for refills to be completed.         To help prevent nausea and vomiting after your treatment, we encourage you to take your nausea medication as directed.  BELOW ARE SYMPTOMS THAT SHOULD BE REPORTED IMMEDIATELY: *FEVER GREATER THAN 100.4 F (38 C) OR HIGHER *CHILLS OR SWEATING *NAUSEA AND VOMITING THAT IS NOT CONTROLLED WITH YOUR NAUSEA MEDICATION *UNUSUAL SHORTNESS OF BREATH *UNUSUAL BRUISING OR BLEEDING *URINARY PROBLEMS (pain or burning when urinating, or frequent urination) *BOWEL PROBLEMS (unusual diarrhea, constipation, pain near the anus) TENDERNESS IN MOUTH AND THROAT WITH OR WITHOUT PRESENCE OF ULCERS (sore throat, sores in mouth, or a toothache) UNUSUAL RASH, SWELLING OR PAIN  UNUSUAL VAGINAL DISCHARGE OR ITCHING   Items with * indicate a potential emergency and should be followed up as soon as possible or go to the Emergency Department if any problems should occur.  Please show the CHEMOTHERAPY ALERT CARD or IMMUNOTHERAPY ALERT CARD at check-in to the Emergency Department and triage nurse.  Should you have questions after your visit or need to  cancel or reschedule your appointment, please contact [facility] [phone]  and follow the prompts.  Office hours are 8:00 a.m. to 4:30 p.m. Monday - Friday. Please note that voicemails left after 4:00 p.m. may not be returned until the following business day.  We are closed weekends and major holidays. You have access to a nurse at all times for urgent questions. Please call the main number to the clinic [phone] and follow the prompts.  For any non-urgent questions, you may also contact your provider using MyChart. We now offer e-Visits for anyone 18 and older to request care online for non-urgent symptoms. For details visit [email or web address].   Also download the MyChart app! Go to the app store, search "MyChart", open the app, select [facility], and log in with your MyChart username and password.  Masks are optional in the cancer centers. If you would like for your care team to wear a mask while they are taking care of you, please let them know. For doctor visits, patients may have with them one support person who is at least 80 years old. At this time, visitors are not allowed in the infusion area.  

## 2021-09-12 LAB — TYPE AND SCREEN
ABO/RH(D): O POS
Antibody Screen: NEGATIVE
Unit division: 0

## 2021-09-12 LAB — BPAM RBC
Blood Product Expiration Date: 202308032359
ISSUE DATE / TIME: 202307071027
Unit Type and Rh: 5100

## 2021-09-14 ENCOUNTER — Other Ambulatory Visit (HOSPITAL_COMMUNITY): Payer: Self-pay

## 2021-09-14 DIAGNOSIS — C9000 Multiple myeloma not having achieved remission: Secondary | ICD-10-CM

## 2021-09-14 DIAGNOSIS — D649 Anemia, unspecified: Secondary | ICD-10-CM

## 2021-09-15 ENCOUNTER — Inpatient Hospital Stay (HOSPITAL_COMMUNITY): Payer: Medicare Other

## 2021-09-15 ENCOUNTER — Inpatient Hospital Stay (HOSPITAL_BASED_OUTPATIENT_CLINIC_OR_DEPARTMENT_OTHER): Payer: Medicare Other | Admitting: Hematology

## 2021-09-15 VITALS — BP 119/59 | HR 78 | Temp 98.3°F | Resp 18

## 2021-09-15 VITALS — BP 120/46 | HR 82 | Temp 98.2°F | Resp 18 | Wt 137.1 lb

## 2021-09-15 DIAGNOSIS — C9000 Multiple myeloma not having achieved remission: Secondary | ICD-10-CM

## 2021-09-15 DIAGNOSIS — D649 Anemia, unspecified: Secondary | ICD-10-CM | POA: Diagnosis not present

## 2021-09-15 DIAGNOSIS — Z5112 Encounter for antineoplastic immunotherapy: Secondary | ICD-10-CM | POA: Diagnosis not present

## 2021-09-15 DIAGNOSIS — Z79899 Other long term (current) drug therapy: Secondary | ICD-10-CM | POA: Diagnosis not present

## 2021-09-15 DIAGNOSIS — C9002 Multiple myeloma in relapse: Secondary | ICD-10-CM | POA: Diagnosis not present

## 2021-09-15 LAB — COMPREHENSIVE METABOLIC PANEL
ALT: 14 U/L (ref 0–44)
AST: 17 U/L (ref 15–41)
Albumin: 3.6 g/dL (ref 3.5–5.0)
Alkaline Phosphatase: 85 U/L (ref 38–126)
Anion gap: 5 (ref 5–15)
BUN: 18 mg/dL (ref 8–23)
CO2: 29 mmol/L (ref 22–32)
Calcium: 8.6 mg/dL — ABNORMAL LOW (ref 8.9–10.3)
Chloride: 102 mmol/L (ref 98–111)
Creatinine, Ser: 0.76 mg/dL (ref 0.44–1.00)
GFR, Estimated: >60 mL/min (ref >60)
Glucose, Bld: 107 mg/dL — ABNORMAL HIGH (ref 70–99)
Potassium: 3.5 mmol/L (ref 3.5–5.1)
Sodium: 136 mmol/L (ref 135–145)
Total Bilirubin: 0.5 mg/dL (ref 0.3–1.2)
Total Protein: 6.5 g/dL (ref 6.5–8.1)

## 2021-09-15 LAB — CBC WITH DIFFERENTIAL/PLATELET
Abs Immature Granulocytes: 0.01 10*3/uL (ref 0.00–0.07)
Basophils Absolute: 0 10*3/uL (ref 0.0–0.1)
Basophils Relative: 0 %
Eosinophils Absolute: 0.1 10*3/uL (ref 0.0–0.5)
Eosinophils Relative: 4 %
HCT: 26.6 % — ABNORMAL LOW (ref 36.0–46.0)
Hemoglobin: 8.2 g/dL — ABNORMAL LOW (ref 12.0–15.0)
Immature Granulocytes: 1 %
Lymphocytes Relative: 55 %
Lymphs Abs: 1.1 10*3/uL (ref 0.7–4.0)
MCH: 29.5 pg (ref 26.0–34.0)
MCHC: 30.8 g/dL (ref 30.0–36.0)
MCV: 95.7 fL (ref 80.0–100.0)
Monocytes Absolute: 0.2 10*3/uL (ref 0.1–1.0)
Monocytes Relative: 9 %
Neutro Abs: 0.6 10*3/uL — ABNORMAL LOW (ref 1.7–7.7)
Neutrophils Relative %: 31 %
Platelets: 55 10*3/uL — ABNORMAL LOW (ref 150–400)
RBC: 2.78 MIL/uL — ABNORMAL LOW (ref 3.87–5.11)
RDW: 18.2 % — ABNORMAL HIGH (ref 11.5–15.5)
WBC: 2 10*3/uL — ABNORMAL LOW (ref 4.0–10.5)
nRBC: 0 % (ref 0.0–0.2)

## 2021-09-15 LAB — SAMPLE TO BLOOD BANK

## 2021-09-15 MED ORDER — ACETAMINOPHEN 325 MG PO TABS
650.0000 mg | ORAL_TABLET | Freq: Once | ORAL | Status: AC
  Administered 2021-09-15: 650 mg via ORAL

## 2021-09-15 MED ORDER — DARATUMUMAB-HYALURONIDASE-FIHJ 1800-30000 MG-UT/15ML ~~LOC~~ SOLN
1800.0000 mg | Freq: Once | SUBCUTANEOUS | Status: DC
  Filled 2021-09-15 (×3): qty 15

## 2021-09-15 MED ORDER — DEXAMETHASONE 4 MG PO TABS
20.0000 mg | ORAL_TABLET | Freq: Once | ORAL | Status: AC
  Administered 2021-09-15: 20 mg via ORAL

## 2021-09-15 MED ORDER — DARATUMUMAB-HYALURONIDASE-FIHJ 1800-30000 MG-UT/15ML ~~LOC~~ SOLN
1800.0000 mg | Freq: Once | SUBCUTANEOUS | Status: AC
  Administered 2021-09-15: 1800 mg via SUBCUTANEOUS
  Filled 2021-09-15: qty 15

## 2021-09-15 MED ORDER — MONTELUKAST SODIUM 10 MG PO TABS
10.0000 mg | ORAL_TABLET | Freq: Once | ORAL | Status: AC
  Administered 2021-09-15: 10 mg via ORAL

## 2021-09-15 MED ORDER — DARATUMUMAB-HYALURONIDASE-FIHJ 1800-30000 MG-UT/15ML ~~LOC~~ SOLN
1800.0000 mg | Freq: Once | SUBCUTANEOUS | Status: DC
  Filled 2021-09-15: qty 15

## 2021-09-15 MED ORDER — DIPHENHYDRAMINE HCL 25 MG PO CAPS
50.0000 mg | ORAL_CAPSULE | Freq: Once | ORAL | Status: AC
  Administered 2021-09-15: 50 mg via ORAL

## 2021-09-15 MED ORDER — SODIUM CHLORIDE 0.9 % IV SOLN: INTRAVENOUS | Status: AC

## 2021-09-15 NOTE — Addendum Note (Signed)
Addended by: Henreitta Leber E on: 09/15/2021 11:42 AM   Modules accepted: Orders

## 2021-09-15 NOTE — Addendum Note (Signed)
Addended by: Geralynn Ochs on: 09/15/2021 11:15 AM   Modules accepted: Orders

## 2021-09-15 NOTE — Progress Notes (Signed)
Patient tolerated Daratumumab injection with no complaints voiced. See MAR for details. Lab reviewed. Injection site clean and dry with no bruising or swelling noted at site. Band aid applied. Patient stayed 2 hour wait time. Vss with discharge and left in satisfactory condition with nos/s of distress noted.

## 2021-09-15 NOTE — Patient Instructions (Signed)
[facility]  Discharge Instructions: Thank you for choosing [facility] to provide your oncology and hematology care.  If you have a lab appointment with the [facility], please come in thru the Main Entrance and check in at the main information desk.  Wear comfortable clothing and clothing appropriate for easy access to any Portacath or PICC line.   We strive to give you quality time with your provider. You may need to reschedule your appointment if you arrive late (15 or more minutes).  Arriving late affects you and other patients whose appointments are after yours.  Also, if you miss three or more appointments without notifying the office, you may be dismissed from the clinic at the provider's discretion.      For prescription refill requests, have your pharmacy contact our office and allow 72 hours for refills to be completed.    Today you received the following chemotherapy and/or immunotherapy agentsDaratumumab, return as scheduled. Daratumumab; Hyaluronidase Injection What is this medication? DARATUMUMAB; HYALURONIDASE (dar a toom ue mab / hye al ur ON i dase) is a monoclonal antibody. Hyaluronidase is used to improve the effects of daratumumab. It treats certain types of cancer. Some of the cancers treated are multiple myeloma and light-chain amyloidosis. This medicine may be used for other purposes; ask your health care provider or pharmacist if you have questions. COMMON BRAND NAME(S): DARZALEX FASPRO What should I tell my care team before I take this medication? They need to know if you have any of these conditions: heart disease infection especially a viral infection such as chickenpox, cold sores, herpes, or hepatitis B lung or breathing disease an unusual or allergic reaction to daratumumab, hyaluronidase, other medicines, foods, dyes, or preservatives pregnant or trying to get pregnant breast-feeding How should I use this medication? This medicine  is for injection under the skin. It is given by a health care professional in a hospital or clinic setting. Talk to your pediatrician regarding the use of this medicine in children. Special care may be needed. Overdosage: If you think you have taken too much of this medicine contact a poison control center or emergency room at once. NOTE: This medicine is only for you. Do not share this medicine with others. What if I miss a dose? Keep appointments for follow-up doses as directed. It is important not to miss your dose. Call your doctor or health care professional if you are unable to keep an appointment. What may interact with this medication? Interactions have not been studied. This list may not describe all possible interactions. Give your health care provider a list of all the medicines, herbs, non-prescription drugs, or dietary supplements you use. Also tell them if you smoke, drink alcohol, or use illegal drugs. Some items may interact with your medicine. What should I watch for while using this medication? Your condition will be monitored carefully while you are receiving this medicine. This medicine can cause serious allergic reactions. To reduce your risk, your health care provider may give you other medicine to take before receiving this one. Be sure to follow the directions from your health care provider. This medicine can affect the results of blood tests to match your blood type. These changes can last for up to 6 months after the final dose. Your healthcare provider will do blood tests to match your blood type before you start treatment. Tell all of your healthcare providers that you are being treated with this medicine before receiving a blood  transfusion. This medicine can affect the results of some tests used to determine treatment response; extra tests may be needed to evaluate response. Do not become pregnant while taking this medicine or for 3 months after stopping it. Women should  inform their health care provider if they wish to become pregnant or think they might be pregnant. There is a potential for serious side effects to an unborn child. Talk to your health care provider for more information. Do not breast-feed an infant while taking this medicine. What side effects may I notice from receiving this medication? Side effects that you should report to your care team as soon as possible: Allergic reactions--skin rash, itching or hives, swelling of the face, lips, or tongue Blood clot--chest pain, shortness of breath, pain, swelling or warmth in the leg Blurred vision Fast, irregular heartbeat Infection--fever, chills, cough, sore throat, pain or trouble passing urine Injection reactions--dizziness, fast heartbeat, feeling faint or lightheaded, falls, headache, increase in blood pressure, nausea, vomiting, or wheezing or trouble breathing with loud or whistling sounds Low red blood cell counts--trouble breathing, feeling faint, lightheaded or falls, unusually weak or tired Unusual bleeding or bruising Side effects that usually do not require medical attention (report these to your care team if they continue or are bothersome): Back pain Constipation Diarrhea Pain, tingling, numbness in the hands or feet Pain, redness, or irritation at site where injected Muscle cramp or pain Swelling of the ankles, feet, hands Tiredness Trouble sleeping This list may not describe all possible side effects. Call your doctor for medical advice about side effects. You may report side effects to FDA at 1-800-FDA-1088. Where should I keep my medication? This drug is given in a hospital or clinic and will not be stored at home. NOTE: This sheet is a summary. It may not cover all possible information. If you have questions about this medicine, talk to your doctor, pharmacist, or health care provider.  2023 Elsevier/Gold Standard (2021-01-23 00:00:00)    To help prevent nausea and  vomiting after your treatment, we encourage you to take your nausea medication as directed.  BELOW ARE SYMPTOMS THAT SHOULD BE REPORTED IMMEDIATELY: *FEVER GREATER THAN 100.4 F (38 C) OR HIGHER *CHILLS OR SWEATING *NAUSEA AND VOMITING THAT IS NOT CONTROLLED WITH YOUR NAUSEA MEDICATION *UNUSUAL SHORTNESS OF BREATH *UNUSUAL BRUISING OR BLEEDING *URINARY PROBLEMS (pain or burning when urinating, or frequent urination) *BOWEL PROBLEMS (unusual diarrhea, constipation, pain near the anus) TENDERNESS IN MOUTH AND THROAT WITH OR WITHOUT PRESENCE OF ULCERS (sore throat, sores in mouth, or a toothache) UNUSUAL RASH, SWELLING OR PAIN  UNUSUAL VAGINAL DISCHARGE OR ITCHING   Items with * indicate a potential emergency and should be followed up as soon as possible or go to the Emergency Department if any problems should occur.  Please show the CHEMOTHERAPY ALERT CARD or IMMUNOTHERAPY ALERT CARD at check-in to the Emergency Department and triage nurse.  Should you have questions after your visit or need to cancel or reschedule your appointment, please contact [facility] [phone]  and follow the prompts.  Office hours are 8:00 a.m. to 4:30 p.m. Monday - Friday. Please note that voicemails left after 4:00 p.m. may not be returned until the following business day.  We are closed weekends and major holidays. You have access to a nurse at all times for urgent questions. Please call the main number to the clinic [phone] and follow the prompts.  For any non-urgent questions, you may also contact your provider using MyChart.  We now offer e-Visits for anyone 62 and older to request care online for non-urgent symptoms. For details visit [email or web address].   Also download the MyChart app! Go to the app store, search "MyChart", open the app, select [facility], and log in with your MyChart username and password.  Masks are optional in the cancer centers. If you would like for your care team  to wear a mask while they are taking care of you, please let them know. For doctor visits, patients may have with them one support person who is at least 80 years old. At this time, visitors are not allowed in the infusion area.

## 2021-09-15 NOTE — Addendum Note (Signed)
Addended by: Henreitta Leber E on: 09/15/2021 11:34 AM   Modules accepted: Orders

## 2021-09-15 NOTE — Patient Instructions (Addendum)
[facility] Discharge Instructions   You were seen and examined today by Dr. Delton Coombes.  He reviewed the results of your lab work which is stable.  We will proceed with Darzalex injection today. Continue to hold the Pomalyst until we see you again.   Return as scheduled.    Thank you for choosing [facility] at [facility] to provide your oncology and hematology care.  To afford each patient quality time with our provider, please arrive at least 15 minutes before your scheduled appointment time.   If you have a lab appointment with the [facility] please come in thru the Main Entrance and check in at the main information desk.  You need to re-schedule your appointment should you arrive 10 or more minutes late.  We strive to give you quality time with our providers, and arriving late affects you and other patients whose appointments are after yours.  Also, if you no show three or more times for appointments you may be dismissed from the clinic at the providers discretion.     Again, thank you for choosing [facility].  Our hope is that these requests will decrease the amount of time that you wait before being seen by our physicians.       _____________________________________________________________  Should you have questions after your visit to [facility], please contact our office at [phone] and follow the prompts.  Our office hours are 8:00 a.m. and 4:30 p.m. Monday - Friday.  Please note that voicemails left after 4:00 p.m. may not be returned until the following business day.  We are closed weekends and major holidays.  You do have access to a nurse 24-7, just call the main number to the clinic [phone] and do not press any options, hold on the line and a nurse will answer the phone.    For prescription refill requests, have your pharmacy contact our office and allow 72 hours.    Due to  Covid, you will need to wear a mask upon entering the hospital. If you do not have a mask, a mask will be given to you at the Main Entrance upon arrival. For doctor visits, patients may have 1 support person age 103 or older with them. For treatment visits, patients can not have anyone with them due to social distancing guidelines and our immunocompromised population.

## 2021-09-15 NOTE — Progress Notes (Signed)
Courtney Arnold, Courtney Arnold [id number]   CLINIC:  Medical Oncology/Hematology  PCP:  [name], MD [address] / [place] [address] [phone]   REASON FOR VISIT:  Follow-up for IgA kappa plasma cell myeloma  PRIOR THERAPY: Velcade x 9 cycles from 07/04/2019 to 01/10/2020  CURRENT THERAPY: Revlimid 15 mg 2/3 weeks; Xgeva monthly  BRIEF ONCOLOGIC HISTORY:  Oncology History  Multiple myeloma not having achieved remission ([place])  06/07/2019 Initial Diagnosis   Multiple myeloma not having achieved remission ([place])   07/04/2019 - 01/10/2020 Chemotherapy   The patient had dexamethasone (DECADRON) 4 MG tablet, 1 of 1 cycle, Start date: 01/10/2020, End date: -- lenalidomide (REVLIMID) 15 MG capsule, 1 of 1 cycle, Start date: 02/26/2020, End date: -- bortezomib SQ (VELCADE) chemo injection 2.25 mg, 1.3 mg/m2 = 2.25 mg, Subcutaneous,  Once, 9 of 10 cycles Administration: 2.25 mg (07/04/2019), 2.25 mg (07/11/2019), 2.25 mg (07/26/2019), 2.25 mg (08/02/2019), 2.25 mg (08/09/2019), 2.25 mg (08/16/2019), 2.25 mg (08/23/2019), 2.25 mg (08/30/2019), 2.25 mg (09/06/2019), 2.25 mg (09/13/2019), 2.25 mg (09/20/2019), 2.25 mg (09/27/2019), 2.25 mg (10/04/2019), 2.25 mg (10/11/2019), 2.25 mg (10/18/2019), 2.25 mg (10/25/2019), 2.25 mg (11/01/2019), 2.25 mg (11/08/2019), 2.25 mg (11/15/2019), 2.25 mg (11/22/2019), 2.25 mg (11/29/2019), 2.25 mg (12/06/2019), 2.25 mg (12/13/2019), 2.25 mg (12/20/2019), 2.25 mg (12/27/2019), 2.25 mg (01/03/2020), 2.25 mg (01/10/2020)  for chemotherapy treatment.    09/15/2021 -  Chemotherapy   Patient is on Treatment Plan : MYELOMA RELAPSED REFRACTORY Daratumumab SQ + Pomalidomide + Dexamethasone (DaraPd) q28d       CANCER STAGING:  Cancer Staging  No matching staging information was found for the patient.  INTERVAL HISTORY:  Ms. BRYNLIE DAZA, a 81 y.o. female, returns for routine follow-up and consideration for next cycle of chemotherapy. Siren was  last seen on 08/25/2021.  Due for cycle #1 of DaraPd today.   Overall, she tells me she has been feeling pretty well, and she is accompanied by her daughter. Her weight is stable, and her energy levels are good. She did not take dexamethasone last week.   Overall, she feels ready for next cycle of chemo today.    REVIEW OF SYSTEMS:  Review of Systems  Constitutional:  Negative for appetite change and fatigue.  All other systems reviewed and are negative.   PAST MEDICAL/SURGICAL HISTORY:  Past Medical History:  Diagnosis Date   Atrial fibrillation ([place]) 2011   Postop, spontaneous conversion to normal sinus after one hour   Compression fracture 07/24/2009   T12; kyphoplasty   History of echocardiogram 07/2009   EF 65%   Hypertension    Thyroid disease    Tobacco abuse    Past Surgical History:  Procedure Laterality Date   BACK SURGERY     BACK SURGERY  06/06/2015   BREAST EXCISIONAL BIOPSY Left    50 years ago  benign   CHOLECYSTECTOMY N/A 04/25/2015   Procedure: LAPAROSCOPIC CHOLECYSTECTOMY WITH INTRAOPERATIVE CHOLANGIOGRAM;  Surgeon: Mickeal Skinner, MD;  Location: [facility];  Service: General;  Laterality: N/A;   COLONOSCOPY N/A 11/26/2015   Procedure: COLONOSCOPY;  Surgeon: Daneil Dolin, MD;  Location: [facility];  Service: Endoscopy;  Laterality: N/A;  7:30 am   ERCP N/A 04/14/2015   Procedure: ENDOSCOPIC RETROGRADE CHOLANGIOPANCREATOGRAPHY (ERCP) Biliary Sphincterotomy, 10x7 stent placement Dilated bilary system just not well seen;  Surgeon: Rogene Houston, MD;  Location: [facility];  Service: Endoscopy;  Laterality: N/A;   ERCP N/A 06/12/2015  Procedure: ENDOSCOPIC RETROGRADE CHOLANGIOPANCREATOGRAPHY (ERCP);  Surgeon: Rogene Houston, MD;  Location: [facility];  Service: Endoscopy;  Laterality: N/A;   ESOPHAGOGASTRODUODENOSCOPY N/A 06/12/2015   Procedure: DIAGNOSTIC ESOPHAGOGASTRODUODENOSCOPY (EGD);  Surgeon: Rogene Houston, MD;  [facility];  Service:  Endoscopy;  Laterality: N/A;   FEMUR IM NAIL Right 05/11/2019   Procedure: RETROGRADE INTRAMEDULLARY NAIL FEMORAL;  Surgeon: Shona Needles, MD;  Location: [facility];  Service: Orthopedics;  Laterality: Right;   STENT REMOVAL  06/12/2015   Procedure: STENT REMOVAL ;  Surgeon: Rogene Houston, MD;  Location: [facility];  Service: Endoscopy;;    SOCIAL HISTORY:  Social History   Socioeconomic History   Marital status: Widowed    Spouse name: Not on file   Number of children: 2   Years of education: Not on file   Highest education level: Not on file  Occupational History   Occupation: retired  Tobacco Use   Smoking status: Former    Packs/day: 0.15    Years: 20.00    Total pack years: 3.00    Types: Cigarettes    Quit date: 03/08/2013    Years since quitting: 8.5   Smokeless tobacco: Never  Vaping Use   Vaping Use: Never used  Substance and Sexual Activity   Alcohol use: No    Alcohol/week: 0.0 standard drinks of alcohol   Drug use: No   Sexual activity: Not Currently  Other Topics Concern   Not on file  Social History Narrative   Active in gardens and does yard work.   Social Determinants of Health   Financial Resource Strain: Low Risk  (05/08/2019)   Overall Financial Resource Strain (CARDIA)    Difficulty of Paying Living Expenses: Not hard at all  Food Insecurity: No Food Insecurity (12/28/2019)   Hunger Vital Sign    Worried About Running Out of Food in the Last Year: Never true    Ran Out of Food in the Last Year: Never true  Transportation Needs: No Transportation Needs (06/14/2019)   PRAPARE - Hydrologist (Medical): No    Lack of Transportation (Non-Medical): No  Physical Activity: Inactive (05/08/2019)   Exercise Vital Sign    Days of Exercise per Week: 0 days    Minutes of Exercise per Session: 0 min  Stress: No Stress Concern Present (05/08/2019)   [place]    Feeling  of Stress : Not at all  Social Connections: Moderately Isolated (02/06/2020)   Social Connection and Isolation Panel [NHANES]    Frequency of Communication with Friends and Family: More than three times a week    Frequency of Social Gatherings with Friends and Family: More than three times a week    Attends Religious Services: More than 4 times per year    Active Member of [employer] or Organizations: No    Attends Archivist Meetings: Never    Marital Status: Widowed  Intimate Partner Violence: Not At Risk (05/08/2019)   Humiliation, Afraid, Rape, and Kick questionnaire    Fear of Current or Ex-Partner: No    Emotionally Abused: No    Physically Abused: No    Sexually Abused: No    FAMILY HISTORY:  Family History  Problem Relation Age of Onset   Heart attack Father    Stroke Father    Breast cancer Sister    Thyroid disease Neg Hx    Colon cancer  Neg Hx     CURRENT MEDICATIONS:  Current Outpatient Medications  Medication Sig Dispense Refill   acyclovir (ZOVIRAX) 400 MG tablet TAKE (1) TABLET BY MOUTH TWICE DAILY. 60 tablet 6   aspirin EC 81 MG tablet Take 81 mg by mouth daily.     carboxymethylcellulose (REFRESH PLUS) 0.5 % SOLN Place 1 drop into both eyes in the morning, at noon, in the evening, and at bedtime.     Cholecalciferol (VITAMIN D) 125 MCG (5000 UT) CAPS Take 5,000 Units by mouth daily. 90 capsule 1   dexamethasone (DECADRON) 4 MG tablet TAKE 2 TABLETS BY MOUTH once weekly 30 tablet 3   levothyroxine (SYNTHROID) 137 MCG tablet Take 1 tablet (137 mcg total) by mouth daily before breakfast. 90 tablet 3   pomalidomide (POMALYST) 2 MG capsule Take 1 capsule (2 mg total) by mouth daily. Take for 21 days, then hold for 7 days. Repeat every 28 days. 21 capsule 0   prednisoLONE acetate (PRED FORTE) 1 % ophthalmic suspension Place 1 drop into the right eye 4 (four) times daily.     SSD 1 % cream Apply topically daily.     No current facility-administered medications for  this visit.    ALLERGIES:  No Known Allergies  PHYSICAL EXAM:  Performance status (ECOG): 1 - Symptomatic but completely ambulatory  There were no vitals filed for this visit. Wt Readings from Last 3 Encounters:  08/25/21 138 lb 6.4 oz (62.8 kg)  08/07/21 114 lb (51.7 kg)  06/30/21 142 lb 10.2 oz (64.7 kg)   Physical Exam Vitals reviewed.  Constitutional:      Appearance: Normal appearance.  Cardiovascular:     Rate and Rhythm: Normal rate and regular rhythm.     Pulses: Normal pulses.     Heart sounds: Normal heart sounds.  Pulmonary:     Effort: Pulmonary effort is normal.     Breath sounds: Normal breath sounds.  Neurological:     General: No focal deficit present.     Mental Status: She is alert and oriented to person, place, and time.  Psychiatric:        Mood and Affect: Mood normal.        Behavior: Behavior normal.     LABORATORY DATA:  I have reviewed the labs as listed.     Latest Ref Rng & Units 09/15/2021    8:42 AM 09/09/2021   10:35 AM 09/03/2021    9:02 AM  CBC  WBC 4.0 - 10.5 K/uL 2.0  2.0  1.9   Hemoglobin 12.0 - 15.0 g/dL 8.2  6.8  7.2   Hematocrit 36.0 - 46.0 % 26.6  22.0  23.2   Platelets 150 - 400 K/uL 55  76  80       Latest Ref Rng & Units 09/15/2021    8:42 AM 08/25/2021    1:03 PM 07/28/2021   10:25 AM  CMP  Glucose 70 - 99 mg/dL 107  106  101   BUN 8 - 23 mg/dL _0 Creatinine 0.44 - 1.00 mg/dL 0.76  0.74  0.79   Sodium 135 - 145 mmol/L 136  138  137   Potassium 3.5 - 5.1 mmol/L 3.5  3.4  3.5   Chloride 98 - 111 mmol/L 102  103  100   CO2 22 - 32 mmol/L _1 Calcium 8.9 - 10.3 mg/dL 8.6  9.1  8.7  Total Protein 6.5 - 8.1 g/dL 6.5  6.0  6.0   Total Bilirubin 0.3 - 1.2 mg/dL 0.5  0.3  0.4   Alkaline Phos 38 - 126 U/L 85  77  97   AST 15 - 41 U/L _0 ALT 0 - 44 U/L _1 DIAGNOSTIC IMAGING:  I have independently reviewed the scans and discussed with the patient. No results found.   ASSESSMENT:   1.  IgA kappa plasma cell myeloma: -IgA kappa plasma cell myeloma diagnosed on right sacral bone biopsy on 05/17/2019.  SPEP did not show M spike.  Immunofixation shows IgA kappa.  Kappa light chains 42.8, lambda light chains at 9.9, ratio 4.32.  Beta-2 microglobulin 3.7. -PET scan on 06/04/2019 showed expansile lytic lesion involving the right sacral wing.  Lytic lesion involving T10 vertebral body.  Left seventh rib lesion.  Increased uptake in a pathological fracture involving distal aspect of the right femur. 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 -RVD started on 06/26/2019.  Revlimid 15 mg 2 weeks on/1 week off. -Velcade discontinued on 01/10/2020. -She is taking Revlimid 15 mg 2 weeks on 1 week off, with dexamethasone 20 mg on weeks of Revlimid and 10 mg while she is off Revlimid.  Revlimid dose further reduced to 10 mg 3 weeks on/1 week off.  She has developed progressive anemia. - BMBX (08/07/2021): 38% plasma cells in the marrow.  Cytogenetics was normal.  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 - DPD regimen started on, single agent Darzalex and dexamethasone started on 09/15/2021.   PLAN:  1.  Relapsed IgA kappa plasma cell myeloma: - She has received pomalidomide 2 mg tablets #21 in the mail. - Reviewed labs today which showed normal creatinine and calcium.  LFTs are normal.  CBC shows leukopenia with neutropenia and thrombocytopenia which is worsening at 55.  Hemoglobin is 8.2 after transfusion. - I have recommended starting Darzalex with dexamethasone 20 mg weekly.  She gets dexamethasone and premeds.  We will hold off on starting pomalidomide at this time.  I will reevaluate her in 2 weeks.  Once the counts are adequate I will start her on Pomalyst.   2.  Hypokalemia: - Continue OTC potassium daily.  Potassium normal.   3.  Normocytic anemia: - She has anemia from myeloma infiltration.  Previous  nutritional deficiency work-up is negative.  Had to receive blood transfusion on 09/09/2021.   4.  ID prophylaxis: - Continue acyclovir twice daily and aspirin 81 mg daily.   5.  Myeloma bone disease: - Continue calcium supplements.  Continue denosumab monthly.   Orders placed this encounter:  No orders of the defined types were placed in this encounter.    Derek Jack, MD [place] [phone]   I, Thana Ates, am acting as a scribe for Dr. Derek Jack.  I, Derek Jack MD, have reviewed the above documentation for accuracy and completeness, and I agree with the above.

## 2021-09-17 ENCOUNTER — Telehealth (HOSPITAL_COMMUNITY): Payer: Self-pay | Admitting: *Deleted

## 2021-09-17 NOTE — Telephone Encounter (Signed)
Called placed after receiving chemotherapy. Spoke with daughter and she stated pt denies shortness of breath, headaches, diarrhea, nausea and vomiting.  Advised to call the clinic if needed.

## 2021-09-21 ENCOUNTER — Other Ambulatory Visit (HOSPITAL_COMMUNITY): Payer: Self-pay | Admitting: *Deleted

## 2021-09-21 MED ORDER — ONDANSETRON HCL 8 MG PO TABS
8.0000 mg | ORAL_TABLET | Freq: Three times a day (TID) | ORAL | 0 refills | Status: DC | PRN
Start: 2021-09-21 — End: 2022-03-31

## 2021-09-21 NOTE — Progress Notes (Unsigned)
Daughter Vivien Rota called to advise that patient has been having severe abdominal pain and side pain since Saturday, associated with nausea and failure to thrive. Wants to cancel appointments for tomorrow, however left her on the schedule in the event she decided she would like to come in for fluids, pain meds and antiemetics.  Daughter expressed that she has stated that she does not want to go through treatment any further.  Prescription sent in for Zofran 8 mg tablets every 8 hours as needed for nausea.  Will consult with Dr. Delton Coombes regarding pain management.  Made her aware to go to the ER if she continues to worsen and does not choose to come to appointment tomorrow.  Verbalized understanding. She will update [place] if she makes a definitive decision regarding stopping treatment.

## 2021-09-22 ENCOUNTER — Telehealth (HOSPITAL_COMMUNITY): Payer: Self-pay | Admitting: *Deleted

## 2021-09-22 ENCOUNTER — Inpatient Hospital Stay (HOSPITAL_COMMUNITY): Payer: Medicare Other

## 2021-09-22 ENCOUNTER — Encounter (HOSPITAL_COMMUNITY): Payer: Self-pay | Admitting: Radiology

## 2021-09-22 ENCOUNTER — Other Ambulatory Visit (HOSPITAL_COMMUNITY): Payer: Self-pay | Admitting: Hematology

## 2021-09-22 ENCOUNTER — Other Ambulatory Visit (HOSPITAL_COMMUNITY): Payer: Self-pay

## 2021-09-22 ENCOUNTER — Inpatient Hospital Stay (HOSPITAL_COMMUNITY)
Admission: EM | Admit: 2021-09-22 | Discharge: 2021-09-24 | DRG: 808 | Disposition: A | Discharge status: Home / Self Care | Payer: Medicare Other | Attending: Family Medicine | Admitting: Family Medicine

## 2021-09-22 ENCOUNTER — Inpatient Hospital Stay (HOSPITAL_BASED_OUTPATIENT_CLINIC_OR_DEPARTMENT_OTHER): Payer: Medicare Other | Admitting: Hematology

## 2021-09-22 ENCOUNTER — Other Ambulatory Visit: Payer: Self-pay

## 2021-09-22 ENCOUNTER — Emergency Department (HOSPITAL_COMMUNITY): Payer: Medicare Other

## 2021-09-22 VITALS — BP 96/51 | HR 99 | Temp 98.9°F | Resp 17 | Ht 62.0 in | Wt 133.9 lb

## 2021-09-22 DIAGNOSIS — Z66 Do not resuscitate: Secondary | ICD-10-CM | POA: Diagnosis not present

## 2021-09-22 DIAGNOSIS — D649 Anemia, unspecified: Secondary | ICD-10-CM

## 2021-09-22 DIAGNOSIS — E869 Volume depletion, unspecified: Secondary | ICD-10-CM | POA: Diagnosis present

## 2021-09-22 DIAGNOSIS — N179 Acute kidney failure, unspecified: Secondary | ICD-10-CM | POA: Diagnosis not present

## 2021-09-22 DIAGNOSIS — J189 Pneumonia, unspecified organism: Secondary | ICD-10-CM | POA: Diagnosis present

## 2021-09-22 DIAGNOSIS — C9000 Multiple myeloma not having achieved remission: Secondary | ICD-10-CM

## 2021-09-22 DIAGNOSIS — H1131 Conjunctival hemorrhage, right eye: Secondary | ICD-10-CM | POA: Diagnosis not present

## 2021-09-22 DIAGNOSIS — R918 Other nonspecific abnormal finding of lung field: Secondary | ICD-10-CM

## 2021-09-22 DIAGNOSIS — Y842 Radiological procedure and radiotherapy as the cause of abnormal reaction of the patient, or of later complication, without mention of misadventure at the time of the procedure: Secondary | ICD-10-CM | POA: Diagnosis present

## 2021-09-22 DIAGNOSIS — E871 Hypo-osmolality and hyponatremia: Secondary | ICD-10-CM | POA: Diagnosis present

## 2021-09-22 DIAGNOSIS — R1013 Epigastric pain: Secondary | ICD-10-CM | POA: Diagnosis not present

## 2021-09-22 DIAGNOSIS — Z79890 Hormone replacement therapy (postmenopausal): Secondary | ICD-10-CM

## 2021-09-22 DIAGNOSIS — R109 Unspecified abdominal pain: Secondary | ICD-10-CM | POA: Diagnosis present

## 2021-09-22 DIAGNOSIS — E890 Postprocedural hypothyroidism: Secondary | ICD-10-CM | POA: Diagnosis present

## 2021-09-22 DIAGNOSIS — Z7982 Long term (current) use of aspirin: Secondary | ICD-10-CM | POA: Diagnosis not present

## 2021-09-22 DIAGNOSIS — D849 Immunodeficiency, unspecified: Secondary | ICD-10-CM | POA: Diagnosis present

## 2021-09-22 DIAGNOSIS — Z9049 Acquired absence of other specified parts of digestive tract: Secondary | ICD-10-CM

## 2021-09-22 DIAGNOSIS — Z79899 Other long term (current) drug therapy: Secondary | ICD-10-CM | POA: Diagnosis not present

## 2021-09-22 DIAGNOSIS — D696 Thrombocytopenia, unspecified: Secondary | ICD-10-CM | POA: Diagnosis present

## 2021-09-22 DIAGNOSIS — R5381 Other malaise: Secondary | ICD-10-CM | POA: Diagnosis present

## 2021-09-22 DIAGNOSIS — I10 Essential (primary) hypertension: Secondary | ICD-10-CM | POA: Diagnosis present

## 2021-09-22 DIAGNOSIS — Z87891 Personal history of nicotine dependence: Secondary | ICD-10-CM | POA: Diagnosis not present

## 2021-09-22 DIAGNOSIS — Z9221 Personal history of antineoplastic chemotherapy: Secondary | ICD-10-CM

## 2021-09-22 DIAGNOSIS — I480 Paroxysmal atrial fibrillation: Secondary | ICD-10-CM | POA: Diagnosis present

## 2021-09-22 DIAGNOSIS — K5730 Diverticulosis of large intestine without perforation or abscess without bleeding: Secondary | ICD-10-CM | POA: Diagnosis not present

## 2021-09-22 DIAGNOSIS — N281 Cyst of kidney, acquired: Secondary | ICD-10-CM | POA: Diagnosis not present

## 2021-09-22 DIAGNOSIS — D61818 Other pancytopenia: Secondary | ICD-10-CM | POA: Diagnosis not present

## 2021-09-22 LAB — CBC WITH DIFFERENTIAL/PLATELET
Abs Immature Granulocytes: 0 10*3/uL (ref 0.00–0.07)
Band Neutrophils: 9 %
Basophils Absolute: 0 10*3/uL (ref 0.0–0.1)
Basophils Relative: 0 %
Eosinophils Absolute: 0 10*3/uL (ref 0.0–0.5)
Eosinophils Relative: 0 %
HCT: 15.3 % — ABNORMAL LOW (ref 36.0–46.0)
Hemoglobin: 5 g/dL — CL (ref 12.0–15.0)
Lymphocytes Relative: 28 %
Lymphs Abs: 0.7 10*3/uL (ref 0.7–4.0)
MCH: 30.9 pg (ref 26.0–34.0)
MCHC: 32.7 g/dL (ref 30.0–36.0)
MCV: 94.4 fL (ref 80.0–100.0)
Monocytes Absolute: 0.2 10*3/uL (ref 0.1–1.0)
Monocytes Relative: 8 %
Neutro Abs: 1.7 10*3/uL (ref 1.7–7.7)
Neutrophils Relative %: 55 %
Platelets: 42 10*3/uL — ABNORMAL LOW (ref 150–400)
RBC: 1.62 MIL/uL — ABNORMAL LOW (ref 3.87–5.11)
RDW: 18.7 % — ABNORMAL HIGH (ref 11.5–15.5)
WBC: 2.6 10*3/uL — ABNORMAL LOW (ref 4.0–10.5)
nRBC: 0.8 % — ABNORMAL HIGH (ref 0.0–0.2)

## 2021-09-22 LAB — COMPREHENSIVE METABOLIC PANEL
ALT: 16 U/L (ref 0–44)
AST: 16 U/L (ref 15–41)
Albumin: 3.2 g/dL — ABNORMAL LOW (ref 3.5–5.0)
Alkaline Phosphatase: 76 U/L (ref 38–126)
Anion gap: 11 (ref 5–15)
BUN: 26 mg/dL — ABNORMAL HIGH (ref 8–23)
CO2: 28 mmol/L (ref 22–32)
Calcium: 9.2 mg/dL (ref 8.9–10.3)
Chloride: 91 mmol/L — ABNORMAL LOW (ref 98–111)
Creatinine, Ser: 1.65 mg/dL — ABNORMAL HIGH (ref 0.44–1.00)
GFR, Estimated: 31 mL/min — ABNORMAL LOW (ref >60)
Glucose, Bld: 124 mg/dL — ABNORMAL HIGH (ref 70–99)
Potassium: 3.9 mmol/L (ref 3.5–5.1)
Sodium: 130 mmol/L — ABNORMAL LOW (ref 135–145)
Total Bilirubin: 0.8 mg/dL (ref 0.3–1.2)
Total Protein: 6.4 g/dL — ABNORMAL LOW (ref 6.5–8.1)

## 2021-09-22 LAB — SAMPLE TO BLOOD BANK

## 2021-09-22 LAB — MAGNESIUM: Magnesium: 2 mg/dL (ref 1.7–2.4)

## 2021-09-22 LAB — TROPONIN I (HIGH SENSITIVITY)
Troponin I (High Sensitivity): 5 ng/L (ref <18)
Troponin I (High Sensitivity): 5 ng/L (ref <18)

## 2021-09-22 LAB — PREPARE RBC (CROSSMATCH)

## 2021-09-22 MED ORDER — SODIUM CHLORIDE 0.9% FLUSH: 10.0000 mL | INTRAVENOUS | Status: DC | PRN

## 2021-09-22 MED ORDER — SODIUM CHLORIDE 0.9 % IV SOLN: 10.0000 mL/h | Freq: Once | INTRAVENOUS | Status: DC

## 2021-09-22 MED ORDER — SODIUM CHLORIDE 0.9 % IV SOLN
500.0000 mg | INTRAVENOUS | Status: DC
  Administered 2021-09-23 (×2): 500 mg via INTRAVENOUS
  Filled 2021-09-22 (×2): qty 5

## 2021-09-22 MED ORDER — ACETAMINOPHEN 650 MG RE SUPP: 650.0000 mg | Freq: Four times a day (QID) | RECTAL | Status: DC | PRN

## 2021-09-22 MED ORDER — HYDROMORPHONE HCL 1 MG/ML IJ SOLN
0.5000 mg | Freq: Once | INTRAMUSCULAR | Status: AC
  Administered 2021-09-22: 0.5 mg via INTRAVENOUS
  Filled 2021-09-22: qty 0.5

## 2021-09-22 MED ORDER — ACETAMINOPHEN 325 MG PO TABS
650.0000 mg | ORAL_TABLET | Freq: Once | ORAL | Status: DC
  Filled 2021-09-22: qty 2

## 2021-09-22 MED ORDER — DIPHENHYDRAMINE HCL 25 MG PO CAPS
25.0000 mg | ORAL_CAPSULE | Freq: Once | ORAL | Status: DC
  Filled 2021-09-22: qty 1

## 2021-09-22 MED ORDER — SODIUM CHLORIDE 0.9% IV SOLUTION: 250.0000 mL | Freq: Once | INTRAVENOUS | Status: DC

## 2021-09-22 MED ORDER — ACYCLOVIR 800 MG PO TABS
400.0000 mg | ORAL_TABLET | Freq: Two times a day (BID) | ORAL | Status: DC
  Administered 2021-09-22 – 2021-09-24 (×4): 400 mg via ORAL
  Filled 2021-09-22 (×4): qty 1

## 2021-09-22 MED ORDER — ACETAMINOPHEN 325 MG PO TABS
650.0000 mg | ORAL_TABLET | Freq: Four times a day (QID) | ORAL | Status: DC | PRN
  Administered 2021-09-22 – 2021-09-23 (×2): 650 mg via ORAL
  Filled 2021-09-22 (×2): qty 2

## 2021-09-22 MED ORDER — IOHEXOL 300 MG/ML  SOLN
100.0000 mL | Freq: Once | INTRAMUSCULAR | Status: AC | PRN
  Administered 2021-09-22: 75 mL via INTRAVENOUS

## 2021-09-22 NOTE — Patient Instructions (Addendum)
[place] at [facility] Discharge Instructions   You were seen and examined today by Dr. Delton Coombes.  He reviewed the results of your lab work. Your hemoglobin is 5. You will need to receive 2 units of blood.   With all of your multiple symptoms, we will send you to the ER. Especially to help get your pain under control.  We will schedule you follow up before you are discharged from the hospital.    Thank you for choosing [place] at [facility] to provide your oncology and hematology care.  To afford each patient quality time with our provider, please arrive at least 15 minutes before your scheduled appointment time.   If you have a lab appointment with the [facility] please come in thru the Main Entrance and check in at the main information desk.  You need to re-schedule your appointment should you arrive 10 or more minutes late.  We strive to give you quality time with our providers, and arriving late affects you and other patients whose appointments are after yours.  Also, if you no show three or more times for appointments you may be dismissed from the clinic at the providers discretion.     Again, thank you for choosing [facility].  Our hope is that these requests will decrease the amount of time that you wait before being seen by our physicians.       _____________________________________________________________  Should you have questions after your visit to [facility], please contact our office at [phone] and follow the prompts.  Our office hours are 8:00 a.m. and 4:30 p.m. Monday - Friday.  Please note that voicemails left after 4:00 p.m. may not be returned until the following business day.  We are closed weekends and major holidays.  You do have access to a nurse 24-7, just call the main number to the clinic [phone] and do not press any options, hold on the line and a nurse will answer the  phone.    For prescription refill requests, have your pharmacy contact our office and allow 72 hours.    Due to Covid, you will need to wear a mask upon entering the hospital. If you do not have a mask, a mask will be given to you at the Main Entrance upon arrival. For doctor visits, patients may have 1 support person age 46 or older with them. For treatment visits, patients can not have anyone with them due to social distancing guidelines and our immunocompromised population.

## 2021-09-22 NOTE — ED Notes (Signed)
Per pt family- she takes acyclovir twice a day- 7a & 7p- to prevent shingles.

## 2021-09-22 NOTE — Progress Notes (Signed)
CRITICAL VALUE ALERT Critical value received:  hemoglobin 5.0 Date of notification:  09-22-21 Time of notification: 2637 Critical value read back:  Yes.   Nurse who received alert:  C. Kaylem Gidney RN MD notified time and response:  8588, give 2 units of blood today per MD

## 2021-09-22 NOTE — ED Provider Notes (Signed)
[facility] EMERGENCY DEPARTMENT Provider Note   CSN: [id number] Arrival date & time: 09/22/21  1109     History  Chief Complaint  Patient presents with   Abdominal Pain   abnormal labs    Cancer patient    Courtney Arnold is a 81 y.o. female.  Patient was seen by her oncologist today.  She has multiple myeloma.  She started a new chemo agent last week and since she got it she has been hurting in her epigastric area.  Her oncologist did labs on her and found her hemoglobin was 5.0 so Dr. Delton Coombes sent her to the emergency department to be admitted and transfused and evaluated more for her abdominal discomfort  The history is provided by the patient and medical records. No language interpreter was used.  Abdominal Pain Pain location:  Epigastric Pain quality: aching   Pain radiates to:  Does not radiate Pain severity:  Moderate Onset quality:  Sudden Timing:  Constant Progression:  Waxing and waning Chronicity:  New Context: not alcohol use   Associated symptoms: no chest pain, no cough, no diarrhea, no fatigue and no hematuria        Home Medications Prior to Admission medications   Medication Sig Start Date End Date Taking? Authorizing Provider  acyclovir (ZOVIRAX) 400 MG tablet TAKE (1) TABLET BY MOUTH TWICE DAILY. 05/07/21   Derek Jack, MD  aspirin EC 81 MG tablet Take 81 mg by mouth daily.    [provider]  carboxymethylcellulose (REFRESH PLUS) 0.5 % SOLN Place 1 drop into both eyes in the morning, at noon, in the evening, and at bedtime.    [provider]  Cholecalciferol (VITAMIN D) 125 MCG (5000 UT) CAPS Take 5,000 Units by mouth daily. 05/14/19   Corinne Ports, PA-C  dexamethasone (DECADRON) 4 MG tablet TAKE 2 TABLETS BY MOUTH once weekly 06/30/21   Derek Jack, MD  levothyroxine (SYNTHROID) 137 MCG tablet Take 1 tablet (137 mcg total) by mouth daily before breakfast. 02/17/21   Renato Shin, MD  ondansetron (ZOFRAN) 8 MG  tablet Take 1 tablet (8 mg total) by mouth every 8 (eight) hours as needed for nausea or vomiting. 09/21/21   Derek Jack, MD  pomalidomide (POMALYST) 2 MG capsule Take 1 capsule (2 mg total) by mouth daily. Take for 21 days, then hold for 7 days. Repeat every 28 days. 08/26/21   Derek Jack, MD  prednisoLONE acetate (PRED FORTE) 1 % ophthalmic suspension Place 1 drop into the right eye 4 (four) times daily. 08/15/20   [provider]  SSD 1 % cream Apply topically daily. 06/10/21   [provider]      Allergies    Patient has no known allergies.    Review of Systems   Review of Systems  Constitutional:  Negative for appetite change and fatigue.  HENT:  Negative for congestion, ear discharge and sinus pressure.   Eyes:  Negative for discharge.  Respiratory:  Negative for cough.   Cardiovascular:  Negative for chest pain.  Gastrointestinal:  Positive for abdominal pain. Negative for diarrhea.  Genitourinary:  Negative for frequency and hematuria.  Musculoskeletal:  Negative for back pain.  Skin:  Negative for rash.  Neurological:  Negative for seizures and headaches.  Psychiatric/Behavioral:  Negative for hallucinations.     Physical Exam Updated Vital Signs BP (!) 104/53   Pulse 92   Temp 98.1 F (36.7 C) (Oral)   Resp 18   SpO2 98%  Physical Exam Vitals and nursing note reviewed.  Constitutional:      Appearance: She is well-developed.  HENT:     Head: Normocephalic.  Eyes:     General: No scleral icterus.    Conjunctiva/sclera: Conjunctivae normal.  Neck:     Thyroid: No thyromegaly.  Cardiovascular:     Rate and Rhythm: Normal rate and regular rhythm.     Heart sounds: No murmur heard.    No friction rub. No gallop.  Pulmonary:     Breath sounds: No stridor. No wheezing or rales.  Chest:     Chest wall: No tenderness.  Abdominal:     General: There is no distension.     Tenderness: There is no abdominal tenderness. There is no  rebound.  Musculoskeletal:        General: Normal range of motion.     Cervical back: Neck supple.  Lymphadenopathy:     Cervical: No cervical adenopathy.  Skin:    Findings: No erythema or rash.  Neurological:     Mental Status: She is alert and oriented to person, place, and time.     Motor: No abnormal muscle tone.     Coordination: Coordination normal.  Psychiatric:        Behavior: Behavior normal.     ED Results / Procedures / Treatments   Labs (all labs ordered are listed, but only abnormal results are displayed) Labs Reviewed  PREPARE RBC (CROSSMATCH)  TROPONIN I (HIGH SENSITIVITY)  TROPONIN I (HIGH SENSITIVITY)    EKG EKG Interpretation  Date/Time:  Tuesday September 22 2021 11:35:45 EDT Ventricular Rate:  94 PR Interval:  162 QRS Duration: 92 QT Interval:  346 QTC Calculation: 433 R Axis:   83 Text Interpretation: Sinus rhythm Atrial premature complex Consider right ventricular hypertrophy Baseline wander in lead(s) I III aVR aVL V1 V5 Confirmed by Milton Ferguson [phone]) on 09/22/2021 1:54:44 PM  Radiology DG Chest Port 1 View  Result Date: 09/22/2021 CLINICAL DATA:  Abdominal pain, multiple myeloma EXAM: PORTABLE CHEST 1 VIEW COMPARISON:  05/10/2018 chest radiograph. FINDINGS: Stable cardiomediastinal silhouette with normal heart size. No pneumothorax. No pleural effusion. Streaky upper left lung opacities are new. Vertebroplasty material overlies mid and lower thoracic vertebral bodies, unchanged. Healed posterolateral left sixth rib fracture. IMPRESSION: Streaky upper left lung opacities are new and could represent pneumonia and/or atelectasis. Follow-up chest radiographs advised. Electronically Signed   By: Ilona Sorrel M.D.   On: 09/22/2021 12:53   CT ABDOMEN PELVIS W CONTRAST  Result Date: 09/22/2021 CLINICAL DATA:  Abdominal pain, acute, nonlocalized EXAM: CT ABDOMEN AND PELVIS WITH CONTRAST TECHNIQUE: Multidetector CT imaging of the abdomen and pelvis was  performed using the standard protocol following bolus administration of intravenous contrast. RADIATION DOSE REDUCTION: This exam was performed according to the departmental dose-optimization program which includes automated exposure control, adjustment of the mA and/or kV according to patient size and/or use of iterative reconstruction technique. CONTRAST:  89m OMNIPAQUE IOHEXOL 300 MG/ML  SOLN COMPARISON:  March 2021 FINDINGS: Lower chest: No acute abnormality. Hepatobiliary: No focal liver abnormality is seen. Status post cholecystectomy. No unexpected biliary dilatation. Pancreas: Unremarkable. Spleen: Unremarkable. Adrenals/Urinary Tract: Adrenals are unremarkable. Bilateral renal cysts. Bladder is unremarkable. Stomach/Bowel: Stomach is within normal limits. A small hiatal hernia is present bowel is normal in caliber. Normal appendix. Sigmoid diverticulosis. Vascular/Lymphatic: Atherosclerosis.  No enlarged nodes. Reproductive: Uterus and bilateral adnexa are unremarkable. Other: No free fluid. Musculoskeletal: Right femoral intramedullary rod is partially imaged. Chronic  treated T12 compression fracture. Chronic L4 compression fracture. No new destructive osseous lesion. IMPRESSION: No acute abnormality. Sigmoid diverticulosis.  Small hiatal hernia. Electronically Signed   By: Macy Mis M.D.   On: 09/22/2021 12:30    Procedures Procedures    Medications Ordered in ED Medications  0.9 %  sodium chloride infusion (has no administration in time range)  HYDROmorphone (DILAUDID) injection 0.5 mg (0.5 mg Intravenous Given 09/22/21 1157)  iohexol (OMNIPAQUE) 300 MG/ML solution 100 mL (75 mLs Intravenous Contrast Given 09/22/21 1206)    ED Course/ Medical Decision Making/ A&P  CRITICAL CARE Performed by: Milton Ferguson Total critical care time: 40 minutes Critical care time was exclusive of separately billable procedures and treating other patients. Critical care was necessary to treat or prevent  imminent or life-threatening deterioration. Critical care was time spent personally by me on the following activities: development of treatment plan with patient and/or surrogate as well as nursing, discussions with consultants, evaluation of patient's response to treatment, examination of patient, obtaining history from patient or surrogate, ordering and performing treatments and interventions, ordering and review of laboratory studies, ordering and review of radiographic studies, pulse oximetry and re-evaluation of patient's condition.                          Medical Decision Making Amount and/or Complexity of Data Reviewed Labs: ordered. Radiology: ordered. ECG/medicine tests: ordered.  Risk Prescription drug management. Decision regarding hospitalization.  This patient presents to the ED for concern of abdominal pain, this involves an extensive number of treatment options, and is a complaint that carries with it a high risk of complications and morbidity.  The differential diagnosis includes worsening multiple myeloma, gastritis   Co morbidities that complicate the patient evaluation  Multiple myeloma   Additional history obtained:  Additional history obtained from her doctor External records from outside source obtained and reviewed including hospital records   Lab Tests:  I Ordered, and personally interpreted labs.  The pertinent results include: Hemoglobin 5.0   Imaging Studies ordered:  I ordered imaging studies including CT abdomen I independently visualized and interpreted imaging which showed no acute disease I agree with the radiologist interpretation   Cardiac Monitoring: / EKG:  The patient was maintained on a cardiac monitor.  I personally viewed and interpreted the cardiac monitored which showed an underlying rhythm of: Normal sinus rhythm   Consultations Obtained:  I requested consultation with the hospitalist and oncologist,  and discussed lab and  imaging findings as well as pertinent plan - they recommend: Admission to the hospital and transfusion   Problem List / ED Course / Critical interventions / Medication management  Multiple myeloma, abdominal pain, anemia I ordered medication including 2 units packed red blood cells for anemia Reevaluation of the patient after these medicines showed that the patient stayed the same I have reviewed the patients home medicines and have made adjustments as needed   Social Determinants of Health:  None   Test / Admission - Considered:  No other tests considered  Patient with pain related to her chemo medicine for multiple myeloma and anemia.  She will be admitted to medicine and transfused        Final Clinical Impression(s) / ED Diagnoses Final diagnoses:  Epigastric pain    Rx / DC Orders ED Discharge Orders     None         Milton Ferguson, MD 09/24/21 1015

## 2021-09-22 NOTE — H&P (Addendum)
History and Physical    Patient: Courtney Arnold UEK:[id number] DOB: June 27, 1940 DOA: 09/22/2021 DOS: the patient was seen and examined on 09/22/2021 PCP: [name], MD  Patient coming from: Home  Chief Complaint:  Chief Complaint  Patient presents with   Abdominal Pain   abnormal labs    Cancer patient   HPI: Courtney Arnold is a 81 year old female with a history of myeloma, hypothyroidism, paroxysmal atrial fibrillation, and hypertension presenting with 1 week history of malaise, myalgias, and abdominal pain.  The patient was recently started on a new regimen for her myeloma.  She was started on  Darzalex with dexamethasone 20 mg weekly on 09/15/21.  Since then, the patient states that she has had no energy to do anything.  She denies any fevers, chills, chest pain, shortness of breath, coughing, hemoptysis, nausea, vomiting, diarrhea, hematochezia, melena.  She denies any rashes or synovitis.  She has been feeling dizzy but denies any syncope or falls. The patient went to her usual oncology appointment on the morning of 09/22/2021 for her next round of therapy.  Routine blood work showed hemoglobin 5.0, WBC 2.6, and platelets 42,000.  As result, the patient was directed to the emergency department for further evaluation and treatment. In the ED, the patient was afebrile hemodynamically stable with oxygen saturation 95-98% room air.  BMP showed sodium 130, potassium 3.9, bicarbonate 28, serum creatinine 1.65.  LFTs were unremarkable.  CBC as discussed above.  EKG shows sinus rhythm with nonspecific ST changes.  CT of the abdomen and pelvis was negative for any acute findings.  Chest x-ray showed LUL streaky opacity.  Troponin 5>>5  Review of Systems: As mentioned in the history of present illness. All other systems reviewed and are negative. Past Medical History:  Diagnosis Date   Atrial fibrillation ([place]) 2011   Postop, spontaneous conversion to normal sinus after one hour   Compression  fracture 07/24/2009   T12; kyphoplasty   History of echocardiogram 07/2009   EF 65%   Hypertension    Thyroid disease    Tobacco abuse    Past Surgical History:  Procedure Laterality Date   BACK SURGERY     BACK SURGERY  06/06/2015   BREAST EXCISIONAL BIOPSY Left    50 years ago  benign   CHOLECYSTECTOMY N/A 04/25/2015   Procedure: LAPAROSCOPIC CHOLECYSTECTOMY WITH INTRAOPERATIVE CHOLANGIOGRAM;  Surgeon: Mickeal Skinner, MD;  Location: [facility];  Service: General;  Laterality: N/A;   COLONOSCOPY N/A 11/26/2015   Procedure: COLONOSCOPY;  Surgeon: Daneil Dolin, MD;  Location: [facility];  Service: Endoscopy;  Laterality: N/A;  7:30 am   ERCP N/A 04/14/2015   Procedure: ENDOSCOPIC RETROGRADE CHOLANGIOPANCREATOGRAPHY (ERCP) Biliary Sphincterotomy, 10x7 stent placement Dilated bilary system just not well seen;  Surgeon: Rogene Houston, MD;  Location: [facility];  Service: Endoscopy;  Laterality: N/A;   ERCP N/A 06/12/2015   Procedure: ENDOSCOPIC RETROGRADE CHOLANGIOPANCREATOGRAPHY (ERCP);  Surgeon: Rogene Houston, MD;  Location: [facility];  Service: Endoscopy;  Laterality: N/A;   ESOPHAGOGASTRODUODENOSCOPY N/A 06/12/2015   Procedure: DIAGNOSTIC ESOPHAGOGASTRODUODENOSCOPY (EGD);  Surgeon: Rogene Houston, MD;  Location: [facility];  Service: Endoscopy;  Laterality: N/A;   FEMUR IM NAIL Right 05/11/2019   Procedure: RETROGRADE INTRAMEDULLARY NAIL FEMORAL;  Surgeon: Shona Needles, MD;  Location: [facility];  Service: Orthopedics;  Laterality: Right;   STENT REMOVAL  06/12/2015   Procedure: STENT REMOVAL ;  Surgeon: Rogene Houston, MD;  Location: [facility];  Service: Endoscopy;;   Social History:  reports that she quit smoking about 8 years ago. Her smoking use included cigarettes. She has a 3.00 pack-year smoking history. She has never used smokeless tobacco. She reports that she does not drink alcohol and does not use drugs.  No Known Allergies  Family History  Problem Relation Age of  Onset   Heart attack Father    Stroke Father    Breast cancer Sister    Thyroid disease Neg Hx    Colon cancer Neg Hx     Prior to Admission medications   Medication Sig Start Date End Date Taking? Authorizing Provider  acyclovir (ZOVIRAX) 400 MG tablet TAKE (1) TABLET BY MOUTH TWICE DAILY. Patient taking differently: Take 400 mg by mouth 2 (two) times daily. 05/07/21  Yes Derek Jack, MD  aspirin EC 81 MG tablet Take 81 mg by mouth daily.   Yes [provider]  carboxymethylcellulose (REFRESH PLUS) 0.5 % SOLN Place 1 drop into both eyes in the morning, at noon, in the evening, and at bedtime.   Yes [provider]  Cholecalciferol (VITAMIN D) 125 MCG (5000 UT) CAPS Take 5,000 Units by mouth daily. 05/14/19  Yes McClung, Sarah A, PA-C  dexamethasone (DECADRON) 4 MG tablet TAKE 2 TABLETS BY MOUTH once weekly Patient taking differently: Take 8 mg by mouth once a week. 06/30/21  Yes Derek Jack, MD  levothyroxine (SYNTHROID) 137 MCG tablet Take 1 tablet (137 mcg total) by mouth daily before breakfast. 02/17/21  Yes Renato Shin, MD  ondansetron (ZOFRAN) 8 MG tablet Take 1 tablet (8 mg total) by mouth every 8 (eight) hours as needed for nausea or vomiting. 09/21/21  Yes Derek Jack, MD  pomalidomide (POMALYST) 2 MG capsule Take 1 capsule (2 mg total) by mouth daily. Take for 21 days, then hold for 7 days. Repeat every 28 days. 08/26/21  Yes Derek Jack, MD  SSD 1 % cream Apply 1 Application topically daily. 06/10/21  Yes [provider]    Physical Exam: Vitals:   09/22/21 1133 09/22/21 1400  BP: (!) 104/53 (!) 107/46  Pulse: 92 78  Resp: 18 17  Temp: 98.1 F (36.7 C)   TempSrc: Oral   SpO2: 98% 92%   HEENT:  [place]/AT, No thrush, no icterus CV:  RRR, no rub, no S3, no S4 Lung:  scattered rales.  No wheeze Abd:  soft/+BS, NT Ext:  No edema, no lymphangitis, no synovitis, no rash  Data Reviewed: Results reviewed above in  history  Assessment and Plan: * Symptomatic anemia Baseline hemoglobin 7-8 Presented with hemoglobin of 5.0 Secondary to recent new immunotherapy Check FOBT Repeat I09 Repeat folic acid Transfuse 2 units PRBC  Hyponatremia Due to volume depletion and poor solute intake Start IVF  Pulmonary infiltrates Personally reviewed CXR--LUL opacity Start empiric antibiotics given pt's immunocompromised status and inability to form inflammatory response -check PCT -blood culture  AKI (acute kidney injury) ([place]) Baseline creatinine 0.7-0.8 Presented with serum creatinine 1.65 Lifestyle modification  Pancytopenia, acquired ([place]) Secondary to recent immunotherapy Repeat B35 Repeat folic acid  Multiple myeloma not having achieved remission [facility]) Patient follows Dr. 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  Previous nutritional deficiency work-up is negative.  Had to receive blood transfusion on 09/09/2021  Hypothyroidism following radioiodine therapy Continue levothyroxine  Abdominal pain 7/18 CT abd negative -UA/urine culture  Advance Care Planning: FULL  Consults: none  Family Communication: none  Severity of Illness: The appropriate patient status for this patient is OBSERVATION. Observation status is judged to be reasonable and necessary in order to provide the required intensity of service to ensure the patient's safety. The patient's presenting symptoms, physical exam findings, and initial radiographic and laboratory data in the context of their medical condition is felt to place them at decreased risk for further clinical deterioration. Furthermore, it is anticipated that the patient will be medically stable for discharge from the hospital within 2 midnights of admission.   Author: Orson Eva, MD 09/22/2021 3:05 PM  For on call review  [email or web address].

## 2021-09-22 NOTE — Hospital Course (Signed)
81 year old female with a history of myeloma, hypothyroidism, paroxysmal atrial fibrillation, and hypertension presenting with 1 week history of malaise, myalgias, and abdominal pain.  The patient was recently started on a new regimen for her myeloma.  She was started on  Darzalex with dexamethasone 20 mg weekly on 09/15/21.  Since then, the patient states that she has had no energy to do anything.  She denies any fevers, chills, chest pain, shortness of breath, coughing, hemoptysis, nausea, vomiting, diarrhea, hematochezia, melena.  She denies any rashes or synovitis.  She has been feeling dizzy but denies any syncope or falls. The patient went to her usual oncology appointment on the morning of 09/22/2021 for her next round of therapy.  Routine blood work showed hemoglobin 5.0, WBC 2.6, and platelets 42,000.  As result, the patient was directed to the emergency department for further evaluation and treatment. In the ED, the patient was afebrile hemodynamically stable with oxygen saturation 95-98% room air.  BMP showed sodium 130, potassium 3.9, bicarbonate 28, serum creatinine 1.65.  LFTs were unremarkable.  CBC as discussed above.  EKG shows sinus rhythm with nonspecific ST changes.  CT of the abdomen and pelvis was negative for any acute findings.  Chest x-ray showed LUL streaky opacity.  Troponin 5>>5

## 2021-09-22 NOTE — ED Triage Notes (Signed)
Pt to ED via POV from cancer center c/o abdominal pain that started aprox 1 week ago after change in chemo medication. Pt also presents with abnormal labs from cancer center. HGB 5.

## 2021-09-22 NOTE — Assessment & Plan Note (Signed)
Continue levothyroxine 

## 2021-09-22 NOTE — Assessment & Plan Note (Addendum)
Due to volume depletion and poor solute intake Resolved after IVF treatment

## 2021-09-22 NOTE — Progress Notes (Signed)
Message received from A . Ouida Sills RN / Dr. Delton Coombes patient being sent to the ED. No treatment today.

## 2021-09-22 NOTE — ED Notes (Signed)
Blood started

## 2021-09-22 NOTE — Assessment & Plan Note (Addendum)
Baseline creatinine 0.7-0.8 Presented with serum creatinine 1.65 Treated with IV fluid and trending down to normal baseline

## 2021-09-22 NOTE — Progress Notes (Signed)
[place], [place] [address]   CLINIC:  Medical Oncology/Hematology  PCP:  [name], MD [address] / [place] [address] [phone]   REASON FOR VISIT:  Follow-up for IgA kappa plasma cell myeloma  PRIOR THERAPY: Velcade x 9 cycles from 07/04/2019 to 01/10/2020  CURRENT THERAPY: Revlimid 15 mg 2/3 weeks; Xgeva monthly  BRIEF ONCOLOGIC HISTORY:  Oncology History  Multiple myeloma not having achieved remission ([place])  06/07/2019 Initial Diagnosis   Multiple myeloma not having achieved remission ([place])   07/04/2019 - 01/10/2020 Chemotherapy   The patient had dexamethasone (DECADRON) 4 MG tablet, 1 of 1 cycle, Start date: 01/10/2020, End date: -- lenalidomide (REVLIMID) 15 MG capsule, 1 of 1 cycle, Start date: 02/26/2020, End date: -- bortezomib SQ (VELCADE) chemo injection 2.25 mg, 1.3 mg/m2 = 2.25 mg, Subcutaneous,  Once, 9 of 10 cycles Administration: 2.25 mg (07/04/2019), 2.25 mg (07/11/2019), 2.25 mg (07/26/2019), 2.25 mg (08/02/2019), 2.25 mg (08/09/2019), 2.25 mg (08/16/2019), 2.25 mg (08/23/2019), 2.25 mg (08/30/2019), 2.25 mg (09/06/2019), 2.25 mg (09/13/2019), 2.25 mg (09/20/2019), 2.25 mg (09/27/2019), 2.25 mg (10/04/2019), 2.25 mg (10/11/2019), 2.25 mg (10/18/2019), 2.25 mg (10/25/2019), 2.25 mg (11/01/2019), 2.25 mg (11/08/2019), 2.25 mg (11/15/2019), 2.25 mg (11/22/2019), 2.25 mg (11/29/2019), 2.25 mg (12/06/2019), 2.25 mg (12/13/2019), 2.25 mg (12/20/2019), 2.25 mg (12/27/2019), 2.25 mg (01/03/2020), 2.25 mg (01/10/2020)  for chemotherapy treatment.    09/15/2021 -  Chemotherapy   Patient is on Treatment Plan : MYELOMA RELAPSED REFRACTORY Daratumumab SQ + Pomalidomide + Dexamethasone (DaraPd) q28d       CANCER STAGING:  Cancer Staging  No matching staging information was found for the patient.  INTERVAL HISTORY:  Courtney Arnold, a 81 y.o. female, returns for routine follow-up and consideration for next cycle of chemotherapy. Courtney Arnold was  last seen on 09/15/2021.  Due for day #8 cycle #1 of Darzalex Faspro today.   Overall, she tells me she has been feeling pretty well, and she is accompanied by her daughter. She reports severe pain in her abdomen across her stomach, back, and legs which started following her last injection. She denies flatulence. Her last BM was this morning. She reports vomiting, and she has not been eating or drinking well since 7/15; all she has eaten today was part of 1 chicken nugget. Her last episode of vomiting was yesterday. She reports headache for which she has been taking Aleve. She denies fever. She reports urinary urgency today.  Overall, she is not ready for next cycle of chemo today.   REVIEW OF SYSTEMS:  Review of Systems  Constitutional:  Positive for appetite change and fatigue. Negative for fever.  Respiratory:  Positive for shortness of breath.   Gastrointestinal:  Positive for abdominal pain (6/10), nausea and vomiting.  Musculoskeletal:  Positive for arthralgias (6/10 legs) and back pain (6/10).  Neurological:  Positive for headaches.  Psychiatric/Behavioral:  Positive for depression. The patient is nervous/anxious.   All other systems reviewed and are negative.   PAST MEDICAL/SURGICAL HISTORY:  Past Medical History:  Diagnosis Date   Atrial fibrillation ([place]) 2011   Postop, spontaneous conversion to normal sinus after one hour   Compression fracture 07/24/2009   T12; kyphoplasty   History of echocardiogram 07/2009   EF 65%   Hypertension    Thyroid disease    Tobacco abuse    Past Surgical History:  Procedure Laterality Date   BACK SURGERY     BACK SURGERY  06/06/2015  BREAST EXCISIONAL BIOPSY Left    50 years ago  benign   CHOLECYSTECTOMY N/A 04/25/2015   Procedure: LAPAROSCOPIC CHOLECYSTECTOMY WITH INTRAOPERATIVE CHOLANGIOGRAM;  Surgeon: Mickeal Skinner, MD;  Location: [facility];  Service: General;  Laterality: N/A;   COLONOSCOPY N/A 11/26/2015   Procedure:  COLONOSCOPY;  Surgeon: Daneil Dolin, MD;  Location: [facility];  Service: Endoscopy;  Laterality: N/A;  7:30 am   ERCP N/A 04/14/2015   Procedure: ENDOSCOPIC RETROGRADE CHOLANGIOPANCREATOGRAPHY (ERCP) Biliary Sphincterotomy, 10x7 stent placement Dilated bilary system just not well seen;  Surgeon: Rogene Houston, MD;  Location: [facility];  Service: Endoscopy;  Laterality: N/A;   ERCP N/A 06/12/2015   Procedure: ENDOSCOPIC RETROGRADE CHOLANGIOPANCREATOGRAPHY (ERCP);  Surgeon: Rogene Houston, MD;  [facility];  Service: Endoscopy;  Laterality: N/A;   ESOPHAGOGASTRODUODENOSCOPY N/A 06/12/2015   Procedure: DIAGNOSTIC ESOPHAGOGASTRODUODENOSCOPY (EGD);  Surgeon: Rogene Houston, MD;  [facility];  Service: Endoscopy;  Laterality: N/A;   FEMUR IM NAIL Right 05/11/2019   Procedure: RETROGRADE INTRAMEDULLARY NAIL FEMORAL;  Surgeon: Shona Needles, MD;  Location: [facility];  Service: Orthopedics;  Laterality: Right;   STENT REMOVAL  06/12/2015   Procedure: STENT REMOVAL ;  Surgeon: Rogene Houston, MD;  [facility];  Service: Endoscopy;;    SOCIAL HISTORY:  Social History   Socioeconomic History   Marital status: Widowed    Spouse name: Not on file   Number of children: 2   Years of education: Not on file   Highest education level: Not on file  Occupational History   Occupation: retired  Tobacco Use   Smoking status: Former    Packs/day: 0.15    Years: 20.00    Total pack years: 3.00    Types: Cigarettes    Quit date: 03/08/2013    Years since quitting: 8.5   Smokeless tobacco: Never  Vaping Use   Vaping Use: Never used  Substance and Sexual Activity   Alcohol use: No    Alcohol/week: 0.0 standard drinks of alcohol   Drug use: No   Sexual activity: Not Currently  Other Topics Concern   Not on file  Social History Narrative   Active in gardens and does yard work.   Social Determinants of Health   Financial Resource Strain: Low Risk  (05/08/2019)   Overall  Financial Resource Strain (CARDIA)    Difficulty of Paying Living Expenses: Not hard at all  Food Insecurity: No Food Insecurity (12/28/2019)   Hunger Vital Sign    Worried About Running Out of Food in the Last Year: Never true    Ran Out of Food in the Last Year: Never true  Transportation Needs: No Transportation Needs (06/14/2019)   PRAPARE - Hydrologist (Medical): No    Lack of Transportation (Non-Medical): No  Physical Activity: Inactive (05/08/2019)   Exercise Vital Sign    Days of Exercise per Week: 0 days    Minutes of Exercise per Session: 0 min  Stress: No Stress Concern Present (05/08/2019)   [place]    Feeling of Stress : Not at all  Social Connections: Moderately Isolated (02/06/2020)   Social Connection and Isolation Panel [NHANES]    Frequency of Communication with Friends and Family: More than three times a week    Frequency of Social Gatherings with Friends and Family: More than three times a week    Attends Religious  Services: More than 4 times per year    Active Member of Clubs or Organizations: No    Attends Archivist Meetings: Never    Marital Status: Widowed  Intimate Partner Violence: Not At Risk (05/08/2019)   Humiliation, Afraid, Rape, and Kick questionnaire    Fear of Current or Ex-Partner: No    Emotionally Abused: No    Physically Abused: No    Sexually Abused: No    FAMILY HISTORY:  Family History  Problem Relation Age of Onset   Heart attack Father    Stroke Father    Breast cancer Sister    Thyroid disease Neg Hx    Colon cancer Neg Hx     CURRENT MEDICATIONS:  Current Outpatient Medications  Medication Sig Dispense Refill   acyclovir (ZOVIRAX) 400 MG tablet TAKE (1) TABLET BY MOUTH TWICE DAILY. 60 tablet 6   aspirin EC 81 MG tablet Take 81 mg by mouth daily.     carboxymethylcellulose (REFRESH PLUS) 0.5 % SOLN Place 1 drop into both eyes  in the morning, at noon, in the evening, and at bedtime.     Cholecalciferol (VITAMIN D) 125 MCG (5000 UT) CAPS Take 5,000 Units by mouth daily. 90 capsule 1   dexamethasone (DECADRON) 4 MG tablet TAKE 2 TABLETS BY MOUTH once weekly 30 tablet 3   levothyroxine (SYNTHROID) 137 MCG tablet Take 1 tablet (137 mcg total) by mouth daily before breakfast. 90 tablet 3   ondansetron (ZOFRAN) 8 MG tablet Take 1 tablet (8 mg total) by mouth every 8 (eight) hours as needed for nausea or vomiting. 60 tablet 0   pomalidomide (POMALYST) 2 MG capsule Take 1 capsule (2 mg total) by mouth daily. Take for 21 days, then hold for 7 days. Repeat every 28 days. 21 capsule 0   prednisoLONE acetate (PRED FORTE) 1 % ophthalmic suspension Place 1 drop into the right eye 4 (four) times daily.     SSD 1 % cream Apply topically daily.     No current facility-administered medications for this visit.   Facility-Administered Medications Ordered in Other Visits  Medication Dose Route Frequency Provider Last Rate Last Admin   0.9 %  sodium chloride infusion   Intravenous Continuous Derek Jack, MD   Stopped at 09/15/21 1454   daratumumab-hyaluronidase-fihj (DARZALEX FASPRO) 1800-30000 MG-UT/15ML chemo SQ injection 1,800 mg  1,800 mg Subcutaneous Once Derek Jack, MD        ALLERGIES:  No Known Allergies  PHYSICAL EXAM:  Performance status (ECOG): 1 - Symptomatic but completely ambulatory  There were no vitals filed for this visit. Wt Readings from Last 3 Encounters:  09/15/21 137 lb 1.6 oz (62.2 kg)  08/25/21 138 lb 6.4 oz (62.8 kg)  08/07/21 114 lb (51.7 kg)   Physical Exam Vitals reviewed.  Constitutional:      Appearance: Normal appearance.     Comments: In wheelchair  Cardiovascular:     Rate and Rhythm: Normal rate and regular rhythm.     Pulses: Normal pulses.     Heart sounds: Normal heart sounds.  Pulmonary:     Effort: Pulmonary effort is normal.     Breath sounds: Normal breath  sounds.  Abdominal:     Palpations: There is no mass.  Skin:    Findings: No rash.  Neurological:     General: No focal deficit present.     Mental Status: She is alert and oriented to person, place, and time.  Psychiatric:  Mood and Affect: Mood normal.        Behavior: Behavior normal.     LABORATORY DATA:  I have reviewed the labs as listed.     Latest Ref Rng & Units 09/15/2021    8:42 AM 09/09/2021   10:35 AM 09/03/2021    9:02 AM  CBC  WBC 4.0 - 10.5 K/uL 2.0  2.0  1.9   Hemoglobin 12.0 - 15.0 g/dL 8.2  6.8  7.2   Hematocrit 36.0 - 46.0 % 26.6  22.0  23.2   Platelets 150 - 400 K/uL 55  76  80       Latest Ref Rng & Units 09/15/2021    8:42 AM 08/25/2021    1:03 PM 07/28/2021   10:25 AM  CMP  Glucose 70 - 99 mg/dL 107  106  101   BUN 8 - 23 mg/dL _0 Creatinine 0.44 - 1.00 mg/dL 0.76  0.74  0.79   Sodium 135 - 145 mmol/L 136  138  137   Potassium 3.5 - 5.1 mmol/L 3.5  3.4  3.5   Chloride 98 - 111 mmol/L 102  103  100   CO2 22 - 32 mmol/L _1 Calcium 8.9 - 10.3 mg/dL 8.6  9.1  8.7   Total Protein 6.5 - 8.1 g/dL 6.5  6.0  6.0   Total Bilirubin 0.3 - 1.2 mg/dL 0.5  0.3  0.4   Alkaline Phos 38 - 126 U/L 85  77  97   AST 15 - 41 U/L _2 ALT 0 - 44 U/L _3 DIAGNOSTIC IMAGING:  I have independently reviewed the scans and discussed with the patient. No results found.   ASSESSMENT:  1.  IgA kappa plasma cell myeloma: -IgA kappa plasma cell myeloma diagnosed on right sacral bone biopsy on 05/17/2019.  SPEP did not show M spike.  Immunofixation shows IgA kappa.  Kappa light chains 42.8, lambda light chains at 9.9, ratio 4.32.  Beta-2 microglobulin 3.7. -PET scan on 06/04/2019 showed expansile lytic lesion involving the right sacral wing.  Lytic lesion involving T10 vertebral body.  Left seventh rib lesion.  Increased uptake in a pathological fracture involving distal aspect of the right femur. 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 -RVD started on 06/26/2019.  Revlimid 15 mg 2 weeks on/1 week off. -Velcade discontinued on 01/10/2020. -She is taking Revlimid 15 mg 2 weeks on 1 week off, with dexamethasone 20 mg on weeks of Revlimid and 10 mg while she is off Revlimid.  Revlimid dose further reduced to 10 mg 3 weeks on/1 week off.  She has developed progressive anemia. - BMBX (08/07/2021): 38% plasma cells in the marrow.  Cytogenetics was normal.  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 - DPD regimen started on, single agent Darzalex and dexamethasone started on 09/15/2021.   PLAN:  1.  Relapsed IgA kappa plasma cell myeloma: - She has received first dose of daratumumab with 20 mg dexamethasone on 09/15/2021.  She complained of abdominal pain after the injection.  She vomited once yesterday.  She did have a bowel movement today.  Also reported headaches and was taking Advil. - Examination of the injection site does not reveal any cellulitis or swelling.  Nonspecific tenderness diffusely over the  abdomen. - I have recommended evaluation in the ER with CT scan of the abdomen and pelvis. - I have recommended blood transfusion for low hemoglobin.  Her creatinine is also elevated on labs today at 1.65.  She will require IV hydration to bring it down. - I have called and talked to ER physician who will evaluate her in the ER and do the scan.   2.  Hypokalemia: - Continue OTC potassium daily.   3.  Normocytic anemia: - Anemia and thrombocytopenia from myeloma infiltration.  Transfuse as needed.   4.  ID prophylaxis: - Continue acyclovir twice daily and aspirin 81 mg daily.   5.  Myeloma bone disease: - Continue calcium supplements and monthly denosumab.   Orders placed this encounter:  No orders of the defined types were placed in this encounter.    Derek Jack, MD [place] [phone]   I, Thana Ates, am acting  as a scribe for Dr. Derek Jack.  I, Derek Jack MD, have reviewed the above documentation for accuracy and completeness, and I agree with the above.

## 2021-09-22 NOTE — Assessment & Plan Note (Addendum)
7/18 CT abd negative - resolved now

## 2021-09-22 NOTE — Progress Notes (Signed)
PROGRESS NOTE  Courtney Arnold ZOX:[id number] DOB: Nov 03, 1940 DOA: 09/22/2021 PCP: [name], MD  Brief History:  81 year old female with a history of myeloma, hypothyroidism, paroxysmal atrial fibrillation, and hypertension presenting with 1 week history of malaise, myalgias, and abdominal pain.  The patient was recently started on a new regimen for her myeloma.  She was started on  Darzalex with dexamethasone 20 mg weekly on 09/15/21.  Since then, the patient states that she has had no energy to do anything.  She denies any fevers, chills, chest pain, shortness of breath, coughing, hemoptysis, nausea, vomiting, diarrhea, hematochezia, melena.  She denies any rashes or synovitis.  She has been feeling dizzy but denies any syncope or falls. The patient went to her usual oncology appointment on the morning of 09/22/2021 for her next round of therapy.  Routine blood work showed hemoglobin 5.0, WBC 2.6, and platelets 42,000.  As result, the patient was directed to the emergency department for further evaluation and treatment. In the ED, the patient was afebrile hemodynamically stable with oxygen saturation 95-98% room air.  BMP showed sodium 130, potassium 3.9, bicarbonate 28, serum creatinine 1.65.  LFTs were unremarkable.  CBC as discussed above.  EKG shows sinus rhythm with nonspecific ST changes.  CT of the abdomen and pelvis was negative for any acute findings.  Chest x-ray showed LUL streaky opacity.  Troponin 5>>5    Assessment and Plan: * Symptomatic anemia Baseline hemoglobin 7-8 Presented with hemoglobin of 5.0 Secondary to recent new immunotherapy Check FOBT Repeat W11 Repeat folic acid  Hyponatremia Due to volume depletion and poor solute intake Start IVF  Pulmonary infiltrates Personally reviewed CXR--LUL opacity Start empiric antibiotics given pt's immunocompromised status and inability to form inflammatory response -check PCT -blood culture  AKI (acute kidney  injury) ([place]) Baseline creatinine 0.7-0.8 Presented with serum creatinine 1.65 Lifestyle modification  Pancytopenia, acquired ([place]) Secondary to recent immunotherapy Repeat B14 Repeat folic acid  Multiple myeloma not having achieved remission [facility]) Patient follows Dr. 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  Previous nutritional deficiency work-up is negative.  Had to receive blood transfusion on 09/09/2021  Hypothyroidism following radioiodine therapy Continue levothyroxine  Abdominal pain 7/18 CT abd negative -UA/urine culture         Status is: Observation The patient remains OBS appropriate and will d/c before 2 midnights.    Family Communication:   Family at bedside  Consultants:    Code Status:  FULL / DNR  DVT Prophylaxis:  [place] Heparin / [place] Lovenox   Procedures: As Listed in Progress Note Above  Antibiotics: None  RN Pressure Injury Documentation:        Subjective:   Objective: Vitals:   09/22/21 1133  BP: (!) 104/53  Pulse: 92  Resp: 18  Temp: 98.1 F (36.7 C)  TempSrc: Oral  SpO2: 98%   No intake or output data in the 24 hours ending 09/22/21 1458 Weight change:  Exam:  General:  Pt is alert, follows commands appropriately, not in acute distress HEENT: No icterus, No thrush, No neck mass, [place]/AT Cardiovascular: RRR, S1/S2, no rubs, no gallops Respiratory: CTA bilaterally, no wheezing, no crackles, no rhonchi Abdomen: Soft/+BS, non tender, non distended, no guarding Extremities: No edema, No lymphangitis, No petechiae, No rashes, no synovitis   Data Reviewed: I have personally reviewed following labs  and imaging studies Basic Metabolic Panel: Recent Labs  Lab 09/22/21 0942  NA 130*  K 3.9  CL 91*  CO2 28  GLUCOSE 124*  BUN 26*  CREATININE 1.65*  CALCIUM 9.2  MG 2.0   Liver Function  Tests: Recent Labs  Lab 09/22/21 0942  AST 16  ALT 16  ALKPHOS 76  BILITOT 0.8  PROT 6.4*  ALBUMIN 3.2*   No results for input(s): "LIPASE", "AMYLASE" in the last 168 hours. No results for input(s): "AMMONIA" in the last 168 hours. Coagulation Profile: No results for input(s): "INR", "PROTIME" in the last 168 hours. CBC: Recent Labs  Lab 09/22/21 0942  WBC 2.6*  NEUTROABS 1.7  HGB 5.0*  HCT 15.3*  MCV 94.4  PLT 42*   Cardiac Enzymes: No results for input(s): "CKTOTAL", "CKMB", "CKMBINDEX", "TROPONINI" in the last 168 hours. BNP: Invalid input(s): "POCBNP" CBG: No results for input(s): "GLUCAP" in the last 168 hours. HbA1C: No results for input(s): "HGBA1C" in the last 72 hours. Urine analysis:    Component Value Date/Time   COLORURINE YELLOW 05/11/2019 0819   APPEARANCEUR CLEAR 05/11/2019 0819   LABSPEC 1.021 05/11/2019 0819   PHURINE 5.0 05/11/2019 0819   GLUCOSEU NEGATIVE 05/11/2019 0819   HGBUR MODERATE (A) 05/11/2019 0819   BILIRUBINUR NEGATIVE 05/11/2019 0819   KETONESUR NEGATIVE 05/11/2019 0819   PROTEINUR NEGATIVE 05/11/2019 0819   UROBILINOGEN 0.2 10/02/2008 0830   NITRITE NEGATIVE 05/11/2019 0819   LEUKOCYTESUR NEGATIVE 05/11/2019 0819   Sepsis Labs: @LABRCNTIP (procalcitonin:4,lacticidven:4) )No results found for this or any previous visit (from the past 240 hour(s)).   Scheduled Meds: Continuous Infusions:  sodium chloride      Procedures/Studies: DG Chest Port 1 View  Result Date: 09/22/2021 CLINICAL DATA:  Abdominal pain, multiple myeloma EXAM: PORTABLE CHEST 1 VIEW COMPARISON:  05/10/2018 chest radiograph. FINDINGS: Stable cardiomediastinal silhouette with normal heart size. No pneumothorax. No pleural effusion. Streaky upper left lung opacities are new. Vertebroplasty material overlies mid and lower thoracic vertebral bodies, unchanged. Healed posterolateral left sixth rib fracture. IMPRESSION: Streaky upper left lung opacities are new and  could represent pneumonia and/or atelectasis. Follow-up chest radiographs advised. Electronically Signed   By: Ilona Sorrel M.D.   On: 09/22/2021 12:53   CT ABDOMEN PELVIS W CONTRAST  Result Date: 09/22/2021 CLINICAL DATA:  Abdominal pain, acute, nonlocalized EXAM: CT ABDOMEN AND PELVIS WITH CONTRAST TECHNIQUE: Multidetector CT imaging of the abdomen and pelvis was performed using the standard protocol following bolus administration of intravenous contrast. RADIATION DOSE REDUCTION: This exam was performed according to the departmental dose-optimization program which includes automated exposure control, adjustment of the mA and/or kV according to patient size and/or use of iterative reconstruction technique. CONTRAST:  73m OMNIPAQUE IOHEXOL 300 MG/ML  SOLN COMPARISON:  March 2021 FINDINGS: Lower chest: No acute abnormality. Hepatobiliary: No focal liver abnormality is seen. Status post cholecystectomy. No unexpected biliary dilatation. Pancreas: Unremarkable. Spleen: Unremarkable. Adrenals/Urinary Tract: Adrenals are unremarkable. Bilateral renal cysts. Bladder is unremarkable. Stomach/Bowel: Stomach is within normal limits. A small hiatal hernia is present bowel is normal in caliber. Normal appendix. Sigmoid diverticulosis. Vascular/Lymphatic: Atherosclerosis.  No enlarged nodes. Reproductive: Uterus and bilateral adnexa are unremarkable. Other: No free fluid. Musculoskeletal: Right femoral intramedullary rod is partially imaged. Chronic treated T12 compression fracture. Chronic L4 compression fracture. No new destructive osseous lesion. IMPRESSION: No acute abnormality. Sigmoid diverticulosis.  Small hiatal hernia. Electronically Signed   By: PMacy MisM.D.   On: 09/22/2021 12:30    DOrson Eva  DO  [facility]  If 7PM-7AM, please contact night-coverage [email or web address] Password [email or web address] 09/22/2021, 2:58 PM   LOS: 0 days

## 2021-09-22 NOTE — Telephone Encounter (Signed)
Confirmed with daughter Vivien Rota that patient will come in for evaluation by Dr. Delton Coombes for previously mentioned symptoms and treatment plan.

## 2021-09-22 NOTE — Assessment & Plan Note (Addendum)
Baseline hemoglobin 7-8 Presented with hemoglobin of 5.0 Secondary to recent new immunotherapy Check FOBT Repeat E09 Repeat folic acid Transfused 2 units PRBC on 7/18  Hg up to 8.5

## 2021-09-22 NOTE — Progress Notes (Signed)
Report called to Nira Conn, RN in the ER and report given to Dr. Roderic Palau by Dr. Delton Coombes.

## 2021-09-22 NOTE — Assessment & Plan Note (Signed)
Personally reviewed CXR--LUL opacity Start empiric antibiotics given pt's immunocompromised status and inability to form inflammatory response -check PCT -blood culture

## 2021-09-22 NOTE — Assessment & Plan Note (Signed)
Patient follows Dr. 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  Previous nutritional deficiency work-up is negative.  Had to receive blood transfusion on 09/09/2021

## 2021-09-22 NOTE — Assessment & Plan Note (Signed)
Secondary to recent immunotherapy Repeat Z12 Repeat folic acid

## 2021-09-22 NOTE — ED Notes (Signed)
Family at bedside. 

## 2021-09-22 NOTE — ED Notes (Signed)
Paged Dr. Carles Collet and he called to verify that pt can have the blood that is labeled as "least compatible" and approved by Dr. Saralyn Pilar with pathology

## 2021-09-23 DIAGNOSIS — I480 Paroxysmal atrial fibrillation: Secondary | ICD-10-CM | POA: Diagnosis present

## 2021-09-23 DIAGNOSIS — E890 Postprocedural hypothyroidism: Secondary | ICD-10-CM | POA: Diagnosis present

## 2021-09-23 DIAGNOSIS — C9000 Multiple myeloma not having achieved remission: Secondary | ICD-10-CM | POA: Diagnosis present

## 2021-09-23 DIAGNOSIS — Z66 Do not resuscitate: Secondary | ICD-10-CM | POA: Diagnosis not present

## 2021-09-23 DIAGNOSIS — D649 Anemia, unspecified: Secondary | ICD-10-CM | POA: Diagnosis not present

## 2021-09-23 DIAGNOSIS — R5381 Other malaise: Secondary | ICD-10-CM | POA: Diagnosis present

## 2021-09-23 DIAGNOSIS — R1013 Epigastric pain: Secondary | ICD-10-CM | POA: Diagnosis present

## 2021-09-23 DIAGNOSIS — H1131 Conjunctival hemorrhage, right eye: Secondary | ICD-10-CM | POA: Diagnosis not present

## 2021-09-23 DIAGNOSIS — E869 Volume depletion, unspecified: Secondary | ICD-10-CM | POA: Diagnosis present

## 2021-09-23 DIAGNOSIS — N179 Acute kidney failure, unspecified: Secondary | ICD-10-CM | POA: Diagnosis present

## 2021-09-23 DIAGNOSIS — Z79890 Hormone replacement therapy (postmenopausal): Secondary | ICD-10-CM | POA: Diagnosis not present

## 2021-09-23 DIAGNOSIS — J189 Pneumonia, unspecified organism: Secondary | ICD-10-CM | POA: Diagnosis present

## 2021-09-23 DIAGNOSIS — I10 Essential (primary) hypertension: Secondary | ICD-10-CM | POA: Diagnosis present

## 2021-09-23 DIAGNOSIS — D61818 Other pancytopenia: Secondary | ICD-10-CM | POA: Diagnosis present

## 2021-09-23 DIAGNOSIS — E871 Hypo-osmolality and hyponatremia: Secondary | ICD-10-CM | POA: Diagnosis present

## 2021-09-23 DIAGNOSIS — Z9221 Personal history of antineoplastic chemotherapy: Secondary | ICD-10-CM | POA: Diagnosis not present

## 2021-09-23 DIAGNOSIS — Z7982 Long term (current) use of aspirin: Secondary | ICD-10-CM | POA: Diagnosis not present

## 2021-09-23 DIAGNOSIS — Z79899 Other long term (current) drug therapy: Secondary | ICD-10-CM | POA: Diagnosis not present

## 2021-09-23 DIAGNOSIS — Z9049 Acquired absence of other specified parts of digestive tract: Secondary | ICD-10-CM | POA: Diagnosis not present

## 2021-09-23 DIAGNOSIS — R918 Other nonspecific abnormal finding of lung field: Secondary | ICD-10-CM | POA: Diagnosis not present

## 2021-09-23 DIAGNOSIS — D849 Immunodeficiency, unspecified: Secondary | ICD-10-CM | POA: Diagnosis present

## 2021-09-23 DIAGNOSIS — Y842 Radiological procedure and radiotherapy as the cause of abnormal reaction of the patient, or of later complication, without mention of misadventure at the time of the procedure: Secondary | ICD-10-CM | POA: Diagnosis present

## 2021-09-23 DIAGNOSIS — Z87891 Personal history of nicotine dependence: Secondary | ICD-10-CM | POA: Diagnosis not present

## 2021-09-23 HISTORY — DX: Pneumonia, unspecified organism: J18.9

## 2021-09-23 LAB — MAGNESIUM: Magnesium: 2.1 mg/dL (ref 1.7–2.4)

## 2021-09-23 LAB — PROCALCITONIN: Procalcitonin: 2.52 ng/mL

## 2021-09-23 LAB — TSH: TSH: 3.626 u[IU]/mL (ref 0.350–4.500)

## 2021-09-23 LAB — CBC
HCT: 25.1 % — ABNORMAL LOW (ref 36.0–46.0)
Hemoglobin: 8.5 g/dL — ABNORMAL LOW (ref 12.0–15.0)
MCH: 30.4 pg (ref 26.0–34.0)
MCHC: 33.9 g/dL (ref 30.0–36.0)
MCV: 89.6 fL (ref 80.0–100.0)
Platelets: 30 10*3/uL — ABNORMAL LOW (ref 150–400)
RBC: 2.8 MIL/uL — ABNORMAL LOW (ref 3.87–5.11)
RDW: 17.7 % — ABNORMAL HIGH (ref 11.5–15.5)
WBC: 1.9 10*3/uL — ABNORMAL LOW (ref 4.0–10.5)
nRBC: 0 % (ref 0.0–0.2)

## 2021-09-23 LAB — BASIC METABOLIC PANEL
Anion gap: 9 (ref 5–15)
BUN: 29 mg/dL — ABNORMAL HIGH (ref 8–23)
CO2: 27 mmol/L (ref 22–32)
Calcium: 8.5 mg/dL — ABNORMAL LOW (ref 8.9–10.3)
Chloride: 97 mmol/L — ABNORMAL LOW (ref 98–111)
Creatinine, Ser: 1.45 mg/dL — ABNORMAL HIGH (ref 0.44–1.00)
GFR, Estimated: 36 mL/min — ABNORMAL LOW (ref >60)
Glucose, Bld: 110 mg/dL — ABNORMAL HIGH (ref 70–99)
Potassium: 3.6 mmol/L (ref 3.5–5.1)
Sodium: 133 mmol/L — ABNORMAL LOW (ref 135–145)

## 2021-09-23 LAB — FOLATE: Folate: 15.5 ng/mL (ref >5.9)

## 2021-09-23 LAB — VITAMIN B12: Vitamin B-12: 8465 pg/mL — ABNORMAL HIGH (ref 180–914)

## 2021-09-23 MED ORDER — POTASSIUM CHLORIDE IN NACL 20-0.9 MEQ/L-% IV SOLN: INTRAVENOUS | Status: DC

## 2021-09-23 MED ORDER — SODIUM CHLORIDE 0.9 % IV SOLN
1.0000 g | INTRAVENOUS | Status: DC
  Administered 2021-09-23 – 2021-09-24 (×2): 1 g via INTRAVENOUS
  Filled 2021-09-23 (×2): qty 10

## 2021-09-23 MED ORDER — ENSURE ENLIVE PO LIQD
237.0000 mL | Freq: Two times a day (BID) | ORAL | Status: DC
  Administered 2021-09-24: 237 mL via ORAL

## 2021-09-23 MED ORDER — KETOROLAC TROMETHAMINE 15 MG/ML IJ SOLN
15.0000 mg | Freq: Once | INTRAMUSCULAR | Status: AC
  Administered 2021-09-23: 15 mg via INTRAVENOUS
  Filled 2021-09-23: qty 1

## 2021-09-23 MED ORDER — ONDANSETRON HCL 4 MG/2ML IJ SOLN
4.0000 mg | Freq: Four times a day (QID) | INTRAMUSCULAR | Status: DC | PRN
  Administered 2021-09-23: 4 mg via INTRAVENOUS
  Filled 2021-09-23: qty 2

## 2021-09-23 MED ORDER — ONDANSETRON HCL 4 MG PO TABS: 4.0000 mg | ORAL_TABLET | Freq: Four times a day (QID) | ORAL | Status: DC | PRN

## 2021-09-23 MED ORDER — VITAMIN D 25 MCG (1000 UNIT) PO TABS
5000.0000 [IU] | ORAL_TABLET | Freq: Every day | ORAL | Status: DC
  Administered 2021-09-23 – 2021-09-24 (×2): 5000 [IU] via ORAL
  Filled 2021-09-23 (×2): qty 5

## 2021-09-23 MED ORDER — ADULT MULTIVITAMIN W/MINERALS CH
1.0000 | ORAL_TABLET | Freq: Every day | ORAL | Status: DC
  Administered 2021-09-23 – 2021-09-24 (×2): 1 via ORAL
  Filled 2021-09-23 (×2): qty 1

## 2021-09-23 MED ORDER — LEVOTHYROXINE SODIUM 137 MCG PO TABS
137.0000 ug | ORAL_TABLET | Freq: Every day | ORAL | Status: DC
  Administered 2021-09-24: 137 ug via ORAL
  Filled 2021-09-23 (×2): qty 1

## 2021-09-23 NOTE — TOC Progression Note (Signed)
  Transition of Care [facility]) Screening Note   Patient Details  Name: Courtney Arnold Date of Birth: Apr 08, 1940   Transition of Care [facility]) CM/SW Contact:    Boneta Lucks, RN Phone Number: 09/23/2021, 9:55 AM    Transition of Care Department [facility]) has reviewed patient and no TOC needs have been identified at this time. We will continue to monitor patient advancement through interdisciplinary progression rounds. If new patient transition needs arise, please place a TOC consult.      Barriers to Discharge: Continued Medical Work up

## 2021-09-23 NOTE — Progress Notes (Signed)
Initial Nutrition Assessment  DOCUMENTATION CODES:   Not applicable  INTERVENTION:   -Ensure Enlive po BID, each supplement provides 350 kcal and 20 grams of protein.  -MVI with minerals daily  NUTRITION DIAGNOSIS:   Increased nutrient needs related to cancer and cancer related treatments as evidenced by estimated needs.  GOAL:   Patient will meet greater than or equal to 90% of their needs  MONITOR:   PO intake, Supplement acceptance  REASON FOR ASSESSMENT:   Malnutrition Screening Tool    ASSESSMENT:   Pt with a history of myeloma, hypothyroidism, paroxysmal atrial fibrillation, and hypertension presenting with 1 week history of malaise, myalgias, and abdominal pain.  Pt admitted with symptomatic anemia.   Reviewed I/O's: +896 ml x 24 hours  UOP: 200 ml x 24 hours  Pt unavailable at time of visit. Attempted to speak with pt via call to hospital room phone, however, unable to reach. RD unable to obtain further nutrition-related history or complete nutrition-focused physical exam at this time.     Pt with multiple myeloma followed by [facility]. She is currently on Darzalex with dexamethasone 20 mg weekly (started 09/15/21).   Pt currently on a regular diet. Noted meal completions 100%.   Reviewed wt hx; pt has experienced a 5.4% wt loss over the past 3 months, which is not significant for time frame.   Pt with increased nutritional needs and would benefit from addition of oral nutrition supplements.   Medications reviewed and include vitamin D3 and 0.9% NaCl with KCl 20 mEq/L infusion @ 75 ml/hr.   Labs reviewed: Na: 133, CBGS: 114.   Diet Order:   Diet Order             Diet regular Room service appropriate? Yes; Fluid consistency: Thin  Diet effective now                   EDUCATION NEEDS:   No education needs have been identified at this time  Skin:  Skin Assessment: Reviewed RN Assessment  Last BM:  09/22/21  Height:   Ht Readings  from Last 1 Encounters:  09/22/21 5' 2"  (1.575 m)    Weight:   Wt Readings from Last 1 Encounters:  09/23/21 61.2 kg    Ideal Body Weight:  50 kg  BMI:  Body mass index is 24.68 kg/m.  Estimated Nutritional Needs:   Kcal:  1650-1850  Protein:  80-95 grams  Fluid:  > 1.6 L    Loistine Chance, RD, LDN, [place] Registered Dietitian II Certified Diabetes Care and Education Specialist Please refer to [facility] for RD and/or RD on-call/weekend/after hours pager

## 2021-09-23 NOTE — Progress Notes (Signed)
PROGRESS NOTE   Courtney Arnold  ZOX:[id number] DOB: 1940-12-05 DOA: 09/22/2021 PCP: [name], MD   Chief Complaint  Patient presents with   Abdominal Pain   abnormal labs    Cancer patient   Level of care: Med-Surg  Brief Admission History:  81 year old female with a history of myeloma, hypothyroidism, paroxysmal atrial fibrillation, and hypertension presenting with 1 week history of malaise, myalgias, and abdominal pain.  The patient was recently started on a new regimen for her myeloma.  She was started on  Darzalex with dexamethasone 20 mg weekly on 09/15/21.  Since then, the patient states that she has had no energy to do anything.  She denies any fevers, chills, chest pain, shortness of breath, coughing, hemoptysis, nausea, vomiting, diarrhea, hematochezia, melena.  She denies any rashes or synovitis.  She has been feeling dizzy but denies any syncope or falls. The patient went to her usual oncology appointment on the morning of 09/22/2021 for her next round of therapy.  Routine blood work showed hemoglobin 5.0, WBC 2.6, and platelets 42,000.  As result, the patient was directed to the emergency department for further evaluation and treatment. In the ED, the patient was afebrile hemodynamically stable with oxygen saturation 95-98% room air.  BMP showed sodium 130, potassium 3.9, bicarbonate 28, serum creatinine 1.65.  LFTs were unremarkable.  CBC as discussed above.  EKG shows sinus rhythm with nonspecific ST changes.  CT of the abdomen and pelvis was negative for any acute findings.  Chest x-ray showed LUL streaky opacity.  Troponin 5>>5   Assessment and Plan: * Symptomatic anemia Baseline hemoglobin 7-8 Presented with hemoglobin of 5.0 Secondary to recent new immunotherapy Check FOBT Repeat W11 Repeat folic acid Transfuse 2 units PRBC on 7/18  Hg up to 8.5   CAP (community acquired pneumonia) -continue IV antibiotics as ordered and supportive measures  Hyponatremia Due to  volume depletion and poor solute intake Start IVF  Pulmonary infiltrates Personally reviewed CXR--LUL opacity Continue IV antibiotics given pt's immunocompromised status -blood cultures NGTD   AKI (acute kidney injury) ([place]) Baseline creatinine 0.7-0.8 Presented with serum creatinine 1.65 Continue IV fluid, recheck BMP in AM   Pancytopenia, acquired ([place]) Secondary to recent immunotherapy Repeat B14 Repeat folic acid  Multiple myeloma not having achieved remission [facility]) Patient follows Dr. 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  Previous nutritional deficiency work-up is negative.  Had to receive blood transfusion on 09/09/2021  Hypothyroidism following radioiodine therapy Continue levothyroxine  Abdominal pain 7/18 CT abd negative -UA/urine culture  DVT prophylaxis: SCDs Code Status: Full  Family Communication:  Disposition: Status is: Inpatient Remains inpatient appropriate because: IV fluids required, recheck critical platelet count in AM    Consultants:   Procedures:   Antimicrobials:    Subjective: Pt says she is less weak feeling after transfusion  Objective: Vitals:   09/23/21 0118 09/23/21 0330 09/23/21 0745 09/23/21 1434  BP: (!) 99/42 (!) 117/56 (!) 109/46 (!) 103/58  Pulse: 88 79 91 98  Resp: _0 Temp: 98.2 F (36.8 C) 98.2 F (36.8 C) (!) 97.5 F (36.4 C) 98.3 F (36.8 C)  TempSrc: Oral Oral Oral Oral  SpO2: 94% 96% 96% 93%  Weight:      Height:        Intake/Output Summary (Last 24 hours) at 09/23/2021 1616 Last data filed at 09/23/2021 1515 Gross  per 24 hour  Intake 1455.9 ml  Output 200 ml  Net 1255.9 ml   Filed Weights   09/22/21 2017 09/23/21 0041  Weight: 60.7 kg 61.2 kg   Examination:  General exam: pt appears very frail and weak, NAD.   Respiratory system: no increased work of  breathing. Cardiovascular system: normal S1 & S2 heard. No JVD, murmurs, rubs, gallops or clicks. No pedal edema. Gastrointestinal system: Abdomen is nondistended, soft and nontender. No organomegaly or masses felt. Normal bowel sounds heard. Central nervous system: Alert and oriented. No focal neurological deficits. Extremities: Symmetric 5 x 5 power. Skin: No rashes, lesions or ulcers. Psychiatry: Judgement and insight appear normal. Mood & affect appropriate.   Data Reviewed: I have personally reviewed following labs and imaging studies  CBC: Recent Labs  Lab 09/22/21 0942 09/23/21 0631  WBC 2.6* 1.9*  NEUTROABS 1.7  --   HGB 5.0* 8.5*  HCT 15.3* 25.1*  MCV 94.4 89.6  PLT 42* 30*    Basic Metabolic Panel: Recent Labs  Lab 09/22/21 0942 09/23/21 0631  NA 130* 133*  K 3.9 3.6  CL 91* 97*  CO2 28 27  GLUCOSE 124* 110*  BUN 26* 29*  CREATININE 1.65* 1.45*  CALCIUM 9.2 8.5*  MG 2.0 2.1    CBG: No results for input(s): "GLUCAP" in the last 168 hours.  Recent Results (from the past 240 hour(s))  Culture, blood (Routine X 2) w Reflex to ID Panel     Status: None (Preliminary result)   Collection Time: 09/23/21  6:30 AM   Specimen: BLOOD  Result Value Ref Range Status   Specimen Description BLOOD RIGHT ANTECUBITAL  Final   Special Requests   Final    BOTTLES DRAWN AEROBIC AND ANAEROBIC Blood Culture adequate volume Performed at [facility], [place] [address] [place], [place] [address]    Culture PENDING  Incomplete   Report Status PENDING  Incomplete  Culture, blood (Routine X 2) w Reflex to ID Panel     Status: None (Preliminary result)   Collection Time: 09/23/21 11:04 AM   Specimen: BLOOD  Result Value Ref Range Status   Specimen Description BLOOD RIGHT ANTECUBITAL  Final   Special Requests   Final    BOTTLES DRAWN AEROBIC AND ANAEROBIC Blood Culture adequate volume Performed at [facility], [address], [place], [place] [address]     Culture PENDING  Incomplete   Report Status PENDING  Incomplete     Radiology Studies: DG Chest Port 1 View  Result Date: 09/22/2021 CLINICAL DATA:  Abdominal pain, multiple myeloma EXAM: PORTABLE CHEST 1 VIEW COMPARISON:  05/10/2018 chest radiograph. FINDINGS: Stable cardiomediastinal silhouette with normal heart size. No pneumothorax. No pleural effusion. Streaky upper left lung opacities are new. Vertebroplasty material overlies mid and lower thoracic vertebral bodies, unchanged. Healed posterolateral left sixth rib fracture. IMPRESSION: Streaky upper left lung opacities are new and could represent pneumonia and/or atelectasis. Follow-up chest radiographs advised. Electronically Signed   By: Ilona Sorrel M.D.   On: 09/22/2021 12:53   CT ABDOMEN PELVIS W CONTRAST  Result Date: 09/22/2021 CLINICAL DATA:  Abdominal pain, acute, nonlocalized EXAM: CT ABDOMEN AND PELVIS WITH CONTRAST TECHNIQUE: Multidetector CT imaging of the abdomen and pelvis was performed using the standard protocol following bolus administration of intravenous contrast. RADIATION DOSE REDUCTION: This exam was performed according to the departmental dose-optimization program which includes automated exposure control, adjustment of the mA and/or kV according to patient size and/or use  of iterative reconstruction technique. CONTRAST:  59m OMNIPAQUE IOHEXOL 300 MG/ML  SOLN COMPARISON:  March 2021 FINDINGS: Lower chest: No acute abnormality. Hepatobiliary: No focal liver abnormality is seen. Status post cholecystectomy. No unexpected biliary dilatation. Pancreas: Unremarkable. Spleen: Unremarkable. Adrenals/Urinary Tract: Adrenals are unremarkable. Bilateral renal cysts. Bladder is unremarkable. Stomach/Bowel: Stomach is within normal limits. A small hiatal hernia is present bowel is normal in caliber. Normal appendix. Sigmoid diverticulosis. Vascular/Lymphatic: Atherosclerosis.  No enlarged nodes. Reproductive: Uterus and bilateral  adnexa are unremarkable. Other: No free fluid. Musculoskeletal: Right femoral intramedullary rod is partially imaged. Chronic treated T12 compression fracture. Chronic L4 compression fracture. No new destructive osseous lesion. IMPRESSION: No acute abnormality. Sigmoid diverticulosis.  Small hiatal hernia. Electronically Signed   By: PMacy MisM.D.   On: 09/22/2021 12:30    Scheduled Meds:  acyclovir  400 mg Oral BID   cholecalciferol  5,000 Units Oral Daily   feeding supplement  237 mL Oral BID BM   levothyroxine  137 mcg Oral QAC breakfast   multivitamin with minerals  1 tablet Oral Daily   Continuous Infusions:  0.9 % NaCl with KCl 20 mEq / L     azithromycin 500 mg (09/23/21 0528)   cefTRIAXone (ROCEPHIN)  IV Stopped (09/23/21 0506)     LOS: 0 days   Time spent: 325mins  Libertie Hausler JWynetta Emery MD How to contact the [facility] CampusAttending or Consulting provider [place] covering provider during after hours 7[place] for this patient?  Check the care team in [facility] look for a) attending/consulting TRH provider listed and b) the T[facility]team listed Log into [email or web address] and use [place]'s universal password to access. If you do not have the password, please contact the hospital operator. Locate the T[facility] Hospitalprovider you are looking for under [facility] and page to a number that you can be directly reached. If you still have difficulty reaching the provider, please page the [facility](Director on Call) for the Hospitalists listed on amion for assistance.  09/23/2021, 4:16 PM

## 2021-09-23 NOTE — Plan of Care (Signed)
  Problem: Acute Rehab PT Goals(only PT should resolve) Goal: Patient Will Transfer Sit To/From Stand Outcome: Progressing Flowsheets (Taken 09/23/2021 1303) Patient will transfer sit to/from stand:  with supervision  with min guard assist Goal: Pt Will Transfer Bed To Chair/Chair To Bed Outcome: Progressing Flowsheets (Taken 09/23/2021 1303) Pt will Transfer Bed to Chair/Chair to Bed:  with supervision  min guard assist Goal: Pt Will Ambulate Outcome: Progressing Flowsheets (Taken 09/23/2021 1303) Pt will Ambulate:  with least restrictive assistive device  50 feet  with min guard assist Goal: Pt/caregiver will Perform Home Exercise Program Outcome: Progressing Flowsheets (Taken 09/23/2021 1303) Pt/caregiver will Perform Home Exercise Program:  For improved balance  With Supervision, verbal cues required/provided  For increased strengthening   Problem: Acute Rehab PT Goals(only PT should resolve) Goal: Patient Will Transfer Sit To/From Stand Outcome: Progressing Flowsheets (Taken 09/23/2021 1303) Patient will transfer sit to/from stand:  with supervision  with min guard assist Goal: Pt Will Transfer Bed To Chair/Chair To Bed Outcome: Progressing Flowsheets (Taken 09/23/2021 1303) Pt will Transfer Bed to Chair/Chair to Bed:  with supervision  min guard assist Goal: Pt Will Ambulate Outcome: Progressing Flowsheets (Taken 09/23/2021 1303) Pt will Ambulate:  with least restrictive assistive device  50 feet  with min guard assist Goal: Pt/caregiver will Perform Home Exercise Program Outcome: Progressing Flowsheets (Taken 09/23/2021 1303) Pt/caregiver will Perform Home Exercise Program:  For improved balance  With Supervision, verbal cues required/provided  For increased strengthening   Pamala Hurry D. Hartnett-Rands, MS, PT Per [place] [phone] 09/23/2021

## 2021-09-23 NOTE — TOC Progression Note (Signed)
Transition of Care [facility]) - Progression Note    Patient Details  Name: Courtney Arnold MRN: [id number] Date of Birth: Oct 14, 1940  [facility] of [facility]) CM/SW Contact  Boneta Lucks, RN Phone Number: 09/23/2021, 2:07 PM  Clinical Narrative:   PT is recommending HHPT, TOC called the room, daughter on the phone, patient does not want in home therapy.   Expected Discharge Plan: Home/Self Care Barriers to Discharge: Continued Medical Work up  Expected Discharge Plan and Services Expected Discharge Plan: Home/Self Care

## 2021-09-23 NOTE — Assessment & Plan Note (Addendum)
-  treated wtih IV antibiotics as ordered and supportive measures -dc home on oral doxycycline 100 mg BID x 3 more days

## 2021-09-23 NOTE — Progress Notes (Signed)
UNMATCHED BLOOD PRODUCT NOTE  Compare the patient ID on the blood tag to the patient ID on the hospital armband and Blood Bank armband. Then confirm the unit number on the blood tag matches the unit number on the blood product.  If a discrepancy is discovered return the product to blood bank immediately.   Blood Product Type: Packed Red Blood Cells  Unit #: (Found on blood product bag, begins with W) [id number]  Product Code #: (Found on blood product bag, begins with E) [id number]   Start Time: 0103  Starting Rate: 120 ml/hr  Rate increase/decreased  (if applicable):   756   ml/hr  15 min interval after blood start vital signs ; Time 0118 B/P- 99/42, P- 86, R- 18, O2 Sat 94% room air, Temp 98.2 (oral) No reaction noted.  Rate changed time (if applicable): 4332    Stop Time: 0330 VS B/P 117/56, P 79, R 18, O@ 96 room air , Temp 98.2 (oral)  No reaction noted    All Other Documentation should be documented within the Blood Admin Flowsheet per policy.  BLOOD VERIFIED BY: Deeann Dowse RN and Cyndra Numbers RN

## 2021-09-23 NOTE — Evaluation (Signed)
Physical Therapy Evaluation Patient Details Name: Courtney Arnold MRN: [id number] DOB: [birth year] Today's Date: 09/23/2021  History of Present Illness  Courtney Arnold is a 81 year old female with a history of myeloma, hypothyroidism, paroxysmal atrial fibrillation, and hypertension presenting with 1 week history of malaise, myalgias, and abdominal pain.  The patient was recently started on a new regimen for her myeloma.  She was started on  Darzalex with dexamethasone 20 mg weekly on 09/15/21.  Since then, the patient states that she has had no energy to do anything.  She denies any fevers, chills, chest pain, shortness of breath, coughing, hemoptysis, nausea, vomiting, diarrhea, hematochezia, melena.  She denies any rashes or synovitis.  She has been feeling dizzy but denies any syncope or falls.  The patient went to her usual oncology appointment on the morning of 09/22/2021 for her next round of therapy.  Routine blood work showed hemoglobin 5.0, WBC 2.6, and platelets 42,000.  As result, the patient was directed to the emergency department for further evaluation and treatment.   Clinical Impression  Patient supine in bed with two daughters present upon therapist arrival. Patient agreeable to participating in PT evaluation. Patient modified independent for bed mobility. Daughters and patient provide information about patient's previous functional status and equipment availability vs use. Patient refuses to use assistive devices at home and today during evaluation. Patient reports she uses her furniture and walls to walk around in her home. Daughters state they do have 2 non-family members who patient would agree to have them come sit with patient during the day if she needed more supervision or assistance once at home. Patient refused to mobilize with therapist but was agreeable to perform transfers and ambulation with one hand held of daughter with min guard to min assist for steadying. Patient  agreeable to sitting in chair at end of session with daughters reporting one of them would be in room until after dinner tonight. Patient reports she has not interest in HHPT at this time.  Patient would continue to benefit from skilled physical therapy in current environment and next venue to continue return to prior function and increase strength, endurance, balance, coordination, and functional mobility and gait skills.        Recommendations for follow up therapy are one component of a multi-disciplinary discharge planning process, led by the attending physician.  Recommendations may be updated based on patient status, additional functional criteria and insurance authorization.  Follow Up Recommendations Home health PT      Assistance Recommended at Discharge Intermittent Supervision/Assistance  Patient can return home with the following  A little help with walking and/or transfers;A little help with bathing/dressing/bathroom;Assistance with cooking/housework;Assist for transportation;Help with stairs or ramp for entrance    Equipment Recommendations BSC/3in1  Recommendations for Other Services       Functional Status Assessment Patient has had a recent decline in their functional status and demonstrates the ability to make significant improvements in function in a reasonable and predictable amount of time.     Precautions / Restrictions Precautions Precautions: Fall Precaution Comments: patient reports no falls in the last six months Restrictions Weight Bearing Restrictions: No      Mobility  Bed Mobility Overal bed mobility: Modified Independent      Transfers   Equipment used: 1 person hand held assist     General transfer comment: patient refused SPC or RW use; daughter reports patient uses armrest and bedrail to assist with standing at home    Ambulation/Gait  Ambulation/Gait assistance: Min guard, Min assist Gait Distance (Feet): 20 Feet Assistive device: 1  person hand held assist Gait Pattern/deviations: Step-through pattern, Decreased step length - right, Decreased step length - left, Decreased stride length, Wide base of support, Trunk flexed Gait velocity: decreased     General Gait Details: somewhat slow, labored cadence with 1 hand held (refusal to use an assistive device or therapist's hand but would take daughter's hand) for steadying, limited by fatigue; on room air  Stairs      Wheelchair Mobility    Modified Rankin (Stroke Patients Only)       Balance Overall balance assessment: Needs assistance Sitting-balance support: Bilateral upper extremity supported, Feet supported Sitting balance-Leahy Scale: Good Sitting balance - Comments: seated at EOB   Standing balance support: Single extremity supported Standing balance-Leahy Scale: Fair Standing balance comment: reliant on one hand held for balance         Pertinent Vitals/Pain Pain Assessment Pain Assessment: No/denies pain    Home Living Family/patient expects to be discharged to:: Private residence Living Arrangements: Alone Available Help at Discharge: Family;Available PRN/intermittently Type of Home: House Home Access: Level entry       Home Layout: Multi-level;Other (Comment) (2 small stairs to gain entry in to main home with handrails) Home Equipment: Shower seat - built in;Hand held shower head;Grab bars - tub/shower;Grab bars - toilet;Rollator (4 wheels);Cane - single point      Prior Function Prior Level of Function : Driving;Independent/Modified Independent   ADLs Comments: did have help for keeping up her yard     Hand Dominance        Extremity/Trunk Assessment   Upper Extremity Assessment Upper Extremity Assessment: Defer to OT evaluation    Lower Extremity Assessment Lower Extremity Assessment: Generalized weakness    Cervical / Trunk Assessment Cervical / Trunk Assessment: Kyphotic  Communication   Communication: No  difficulties  Cognition Arousal/Alertness: Awake/alert Behavior During Therapy: WFL for tasks assessed/performed Overall Cognitive Status: Within Functional Limits for tasks assessed        General Comments      Exercises     Assessment/Plan    PT Assessment Patient needs continued PT services  PT Problem List Decreased strength;Decreased mobility;Decreased activity tolerance;Decreased balance;Decreased knowledge of use of DME       PT Treatment Interventions DME instruction;Therapeutic exercise;Gait training;Balance training;Functional mobility training;Therapeutic activities;Patient/family education    PT Goals (Current goals can be found in the Care Plan section)  Acute Rehab PT Goals Patient Stated Goal: Go home with family to assist only PT Goal Formulation: With patient/family Time For Goal Achievement: 10/07/21 Potential to Achieve Goals: Fair    Frequency Min 3X/week        AM-PAC PT "6 Clicks" Mobility  Outcome Measure Help needed turning from your back to your side while in a flat bed without using bedrails?: None Help needed moving from lying on your back to sitting on the side of a flat bed without using bedrails?: None Help needed moving to and from a bed to a chair (including a wheelchair)?: A Little Help needed standing up from a chair using your arms (e.g., wheelchair or bedside chair)?: A Little Help needed to walk in hospital room?: A Little Help needed climbing 3-5 steps with a railing? : A Lot 6 Click Score: 19    End of Session   Activity Tolerance: Patient tolerated treatment well;Patient limited by fatigue Patient left: in chair;with family/visitor present;with call bell/phone within reach Nurse Communication: Mobility  status PT Visit Diagnosis: Unsteadiness on feet (R26.81);Other abnormalities of gait and mobility (R26.89);Muscle weakness (generalized) (M62.81)    Time: 4734-0370 PT Time Calculation (min) (ACUTE ONLY): 28 min   Charges:    PT Evaluation $PT Eval Low Complexity: 1 Low PT Treatments $Therapeutic Activity: 8-22 mins        Floria Raveling. Hartnett-Rands, MS, PT Per [place] [phone]  Pamala Hurry  Hartnett-Rands 09/23/2021, 1:02 PM

## 2021-09-24 DIAGNOSIS — D649 Anemia, unspecified: Secondary | ICD-10-CM | POA: Diagnosis not present

## 2021-09-24 DIAGNOSIS — D696 Thrombocytopenia, unspecified: Secondary | ICD-10-CM | POA: Diagnosis present

## 2021-09-24 DIAGNOSIS — J189 Pneumonia, unspecified organism: Secondary | ICD-10-CM | POA: Diagnosis not present

## 2021-09-24 DIAGNOSIS — E871 Hypo-osmolality and hyponatremia: Secondary | ICD-10-CM | POA: Diagnosis not present

## 2021-09-24 DIAGNOSIS — N179 Acute kidney failure, unspecified: Secondary | ICD-10-CM | POA: Diagnosis not present

## 2021-09-24 LAB — BPAM RBC
Blood Product Expiration Date: 202308032359
Blood Product Expiration Date: 202308032359
ISSUE DATE / TIME: 202307182049
ISSUE DATE / TIME: 202307190008
Unit Type and Rh: 5100
Unit Type and Rh: 5100

## 2021-09-24 LAB — CBC
HCT: 23.9 % — ABNORMAL LOW (ref 36.0–46.0)
Hemoglobin: 7.5 g/dL — ABNORMAL LOW (ref 12.0–15.0)
MCH: 30 pg (ref 26.0–34.0)
MCHC: 31.4 g/dL (ref 30.0–36.0)
MCV: 95.6 fL (ref 80.0–100.0)
Platelets: 28 10*3/uL — CL (ref 150–400)
RBC: 2.5 MIL/uL — ABNORMAL LOW (ref 3.87–5.11)
RDW: 18.3 % — ABNORMAL HIGH (ref 11.5–15.5)
WBC: 1.8 10*3/uL — ABNORMAL LOW (ref 4.0–10.5)
nRBC: 0 % (ref 0.0–0.2)

## 2021-09-24 LAB — TYPE AND SCREEN
ABO/RH(D): O POS
Antibody Screen: POSITIVE
Donor AG Type: NEGATIVE
Donor AG Type: NEGATIVE
Unit division: 0
Unit division: 0

## 2021-09-24 LAB — BASIC METABOLIC PANEL
Anion gap: 7 (ref 5–15)
BUN: 15 mg/dL (ref 8–23)
CO2: 27 mmol/L (ref 22–32)
Calcium: 7.7 mg/dL — ABNORMAL LOW (ref 8.9–10.3)
Chloride: 103 mmol/L (ref 98–111)
Creatinine, Ser: 0.79 mg/dL (ref 0.44–1.00)
GFR, Estimated: >60 mL/min (ref >60)
Glucose, Bld: 102 mg/dL — ABNORMAL HIGH (ref 70–99)
Potassium: 4.1 mmol/L (ref 3.5–5.1)
Sodium: 137 mmol/L (ref 135–145)

## 2021-09-24 LAB — MAGNESIUM: Magnesium: 1.9 mg/dL (ref 1.7–2.4)

## 2021-09-24 MED ORDER — SODIUM CHLORIDE 0.9% IV SOLUTION: Freq: Once | INTRAVENOUS | Status: AC

## 2021-09-24 MED ORDER — ENSURE ENLIVE PO LIQD: 237.0000 mL | Freq: Two times a day (BID) | ORAL | 1 refills | Status: AC

## 2021-09-24 MED ORDER — DOXYCYCLINE HYCLATE 100 MG PO CAPS: 100.0000 mg | ORAL_CAPSULE | Freq: Two times a day (BID) | ORAL | 0 refills | Status: DC

## 2021-09-24 MED ORDER — ADULT MULTIVITAMIN W/MINERALS CH: 1.0000 | ORAL_TABLET | Freq: Every day | ORAL | Status: AC

## 2021-09-24 NOTE — Progress Notes (Signed)
Critical value 28 plt count, down from 30. Notified night shift hospitalist Dr. Earnest Conroy.

## 2021-09-24 NOTE — Care Management Important Message (Signed)
Important Message  Patient Details  Name: Courtney Arnold MRN: [id number] Date of Birth: 15-Mar-1940   Medicare Important Message Given:  N/A - LOS <3 / Initial given by admissions     Tommy Medal 09/24/2021, 9:55 AM

## 2021-09-24 NOTE — Discharge Summary (Signed)
Physician Discharge Summary  Courtney Arnold YTK:[id number] DOB: 23-Sep-1940 DOA: 09/22/2021  PCP: [name], MD Oncologist/Hematologist: Dr. Delton Coombes  Admit date: 09/22/2021 Discharge date: 09/24/2021  Admitted From:  Home  Disposition: Home (declined [place])   Recommendations for Outpatient Follow-up:  Follow up with Dr. Delton Coombes in the week  Please obtain CBC in 3 days  Home Health: declined   Discharge Condition: STABLE   CODE STATUS: FULL  DIET: resume prior home diet    Brief Hospitalization Summary: Please see all hospital notes, images, labs for full details of the hospitalization. 81 year old female with a history of myeloma, hypothyroidism, paroxysmal atrial fibrillation, and hypertension presenting with 1 week history of malaise, myalgias, and abdominal pain.  The patient was recently started on a new regimen for her myeloma.  She was started on  Darzalex with dexamethasone 20 mg weekly on 09/15/21.  Since then, the patient states that she has had no energy to do anything.  She denies any fevers, chills, chest pain, shortness of breath, coughing, hemoptysis, nausea, vomiting, diarrhea, hematochezia, melena.  She denies any rashes or synovitis.  She has been feeling dizzy but denies any syncope or falls. The patient went to her usual oncology appointment on the morning of 09/22/2021 for her next round of therapy.  Routine blood work showed hemoglobin 5.0, WBC 2.6, and platelets 42,000.  As result, the patient was directed to the emergency department for further evaluation and treatment. In the ED, the patient was afebrile hemodynamically stable with oxygen saturation 95-98% room air.  BMP showed sodium 130, potassium 3.9, bicarbonate 28, serum creatinine 1.65.  LFTs were unremarkable.  CBC as discussed above.  EKG shows sinus rhythm with nonspecific ST changes.  CT of the abdomen and pelvis was negative for any acute findings.  Chest x-ray showed LUL streaky opacity.  Troponin  5>>5  Hospital course by problem   * Symptomatic anemia Baseline hemoglobin 7-8 Presented with hemoglobin of 5.0 Secondary to recent new immunotherapy Check FOBT Repeat X51 Repeat folic acid Transfused 2 units PRBC on 7/18  Hg up to 8.5   Thrombocytopenia (HCC) - platelet down to 28 today from 30 yesterday, also with right eye subconjunctival hemorrhage, discussed with hematologist Dr. Delton Coombes who said to give 1 unit of platelets and can discharge home today and he will follow up with her outpatient with close outpatient visit.  Pt discharging after platelet transfusion completed.    CAP (community acquired pneumonia) -treated wtih IV antibiotics as ordered and supportive measures -dc home on oral doxycycline 100 mg BID x 3 more days  Hyponatremia Due to volume depletion and poor solute intake Resolved after IVF treatment  Pulmonary infiltrates Personally reviewed CXR--LUL opacity Continue IV antibiotics given pt's immunocompromised status -blood cultures NGTD   AKI (acute kidney injury) ([place]) Baseline creatinine 0.7-0.8 Presented with serum creatinine 1.65 Treated with IV fluid and trending down to normal baseline  Pancytopenia, acquired ([place]) Secondary to recent immunotherapy Repeat Z00 Repeat folic acid  Multiple myeloma not having achieved remission [facility]) Patient follows Dr. 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  Previous nutritional deficiency work-up is negative.  Had to receive blood transfusion on 09/09/2021  Hypothyroidism following radioiodine therapy Continue levothyroxine  Abdominal pain 7/18 CT abd negative - resolved now  Discharge Diagnoses:  Principal Problem:   Symptomatic anemia Active Problems:   Abdominal pain   Hypothyroidism following radioiodine  therapy   Multiple myeloma not having achieved remission  (HCC)   Pancytopenia, acquired ([place])   AKI (acute kidney injury) ([place])   Pulmonary infiltrates   Hyponatremia   CAP (community acquired pneumonia)   Thrombocytopenia [facility]   Discharge Instructions: Discharge Instructions     Ambulatory referral to Hematology / Oncology   Complete by: As directed    Hospital follow up      Allergies as of 09/24/2021   No Known Allergies      Medication List     STOP taking these medications    aspirin EC 81 MG tablet   pomalidomide 2 MG capsule Commonly known as: POMALYST       TAKE these medications    acyclovir 400 MG tablet Commonly known as: ZOVIRAX TAKE (1) TABLET BY MOUTH TWICE DAILY. What changed: See the new instructions.   carboxymethylcellulose 0.5 % Soln Commonly known as: REFRESH PLUS Place 1 drop into both eyes in the morning, at noon, in the evening, and at bedtime.   dexamethasone 4 MG tablet Commonly known as: DECADRON TAKE 2 TABLETS BY MOUTH once weekly What changed:  how much to take how to take this when to take this additional instructions   doxycycline 100 MG capsule Commonly known as: VIBRAMYCIN Take 1 capsule (100 mg total) by mouth 2 (two) times daily for 3 days. Start taking on: September 25, 2021   feeding supplement Liqd Take 237 mLs by mouth 2 (two) times daily between meals. Start taking on: September 25, 2021   levothyroxine 137 MCG tablet Commonly known as: SYNTHROID Take 1 tablet (137 mcg total) by mouth daily before breakfast.   multivitamin with minerals Tabs tablet Take 1 tablet by mouth daily. Start taking on: September 25, 2021   ondansetron 8 MG tablet Commonly known as: Zofran Take 1 tablet (8 mg total) by mouth every 8 (eight) hours as needed for nausea or vomiting.   SSD 1 % cream Generic drug: silver sulfADIAZINE Apply 1 Application topically daily.   Vitamin D 125 MCG (5000 UT) Caps Take 5,000 Units by mouth daily.               Durable Medical Equipment  (From  admission, onward)           Start     Ordered   09/24/21 1432  For home use only DME Bedside commode  Once       Question:  Patient needs a bedside commode to treat with the following condition  Answer:  At high risk for falls   09/24/21 1431            Follow-up Information     [facility], [facility] Follow up.   Why: [employer] will drop ship a 3n1 bedside commode to your home. Contact information: [address] [place] [address] [phone]         Derek Jack, MD. Schedule an appointment as soon as possible for a visit in 3 day(s).   Specialty: Hematology Why: Hospital Follow Up Contact information: [place] [address] [phone]                No Known Allergies Allergies as of 09/24/2021   No Known Allergies      Medication List     STOP taking these medications    aspirin EC 81 MG tablet   pomalidomide 2 MG capsule Commonly known as: POMALYST  TAKE these medications    acyclovir 400 MG tablet Commonly known as: ZOVIRAX TAKE (1) TABLET BY MOUTH TWICE DAILY. What changed: See the new instructions.   carboxymethylcellulose 0.5 % Soln Commonly known as: REFRESH PLUS Place 1 drop into both eyes in the morning, at noon, in the evening, and at bedtime.   dexamethasone 4 MG tablet Commonly known as: DECADRON TAKE 2 TABLETS BY MOUTH once weekly What changed:  how much to take how to take this when to take this additional instructions   doxycycline 100 MG capsule Commonly known as: VIBRAMYCIN Take 1 capsule (100 mg total) by mouth 2 (two) times daily for 3 days. Start taking on: September 25, 2021   feeding supplement Liqd Take 237 mLs by mouth 2 (two) times daily between meals. Start taking on: September 25, 2021   levothyroxine 137 MCG tablet Commonly known as: SYNTHROID Take 1 tablet (137 mcg total) by mouth daily before breakfast.   multivitamin with minerals Tabs tablet Take 1  tablet by mouth daily. Start taking on: September 25, 2021   ondansetron 8 MG tablet Commonly known as: Zofran Take 1 tablet (8 mg total) by mouth every 8 (eight) hours as needed for nausea or vomiting.   SSD 1 % cream Generic drug: silver sulfADIAZINE Apply 1 Application topically daily.   Vitamin D 125 MCG (5000 UT) Caps Take 5,000 Units by mouth daily.               Durable Medical Equipment  (From admission, onward)           Start     Ordered   09/24/21 1432  For home use only DME Bedside commode  Once       Question:  Patient needs a bedside commode to treat with the following condition  Answer:  At high risk for falls   09/24/21 1431            Procedures/Studies: DG Chest Port 1 View  Result Date: 09/22/2021 CLINICAL DATA:  Abdominal pain, multiple myeloma EXAM: PORTABLE CHEST 1 VIEW COMPARISON:  05/10/2018 chest radiograph. FINDINGS: Stable cardiomediastinal silhouette with normal heart size. No pneumothorax. No pleural effusion. Streaky upper left lung opacities are new. Vertebroplasty material overlies mid and lower thoracic vertebral bodies, unchanged. Healed posterolateral left sixth rib fracture. IMPRESSION: Streaky upper left lung opacities are new and could represent pneumonia and/or atelectasis. Follow-up chest radiographs advised. Electronically Signed   By: Ilona Sorrel M.D.   On: 09/22/2021 12:53   CT ABDOMEN PELVIS W CONTRAST  Result Date: 09/22/2021 CLINICAL DATA:  Abdominal pain, acute, nonlocalized EXAM: CT ABDOMEN AND PELVIS WITH CONTRAST TECHNIQUE: Multidetector CT imaging of the abdomen and pelvis was performed using the standard protocol following bolus administration of intravenous contrast. RADIATION DOSE REDUCTION: This exam was performed according to the departmental dose-optimization program which includes automated exposure control, adjustment of the mA and/or kV according to patient size and/or use of iterative reconstruction technique.  CONTRAST:  72m OMNIPAQUE IOHEXOL 300 MG/ML  SOLN COMPARISON:  March 2021 FINDINGS: Lower chest: No acute abnormality. Hepatobiliary: No focal liver abnormality is seen. Status post cholecystectomy. No unexpected biliary dilatation. Pancreas: Unremarkable. Spleen: Unremarkable. Adrenals/Urinary Tract: Adrenals are unremarkable. Bilateral renal cysts. Bladder is unremarkable. Stomach/Bowel: Stomach is within normal limits. A small hiatal hernia is present bowel is normal in caliber. Normal appendix. Sigmoid diverticulosis. Vascular/Lymphatic: Atherosclerosis.  No enlarged nodes. Reproductive: Uterus and bilateral adnexa are unremarkable. Other: No free fluid. Musculoskeletal: Right femoral intramedullary  rod is partially imaged. Chronic treated T12 compression fracture. Chronic L4 compression fracture. No new destructive osseous lesion. IMPRESSION: No acute abnormality. Sigmoid diverticulosis.  Small hiatal hernia. Electronically Signed   By: Macy Mis M.D.   On: 09/22/2021 12:30     Subjective: Pt really wants to go home today does not want to stay another night, no specific complaints.    Discharge Exam: Vitals:   09/24/21 1516 09/24/21 1715  BP: 107/60 (!) 120/52  Pulse: 100 (!) 102  Resp:  20  Temp: 99.4 F (37.4 C) 98.1 F (36.7 C)  SpO2: 93% 93%   Vitals:   09/24/21 1444 09/24/21 1516 09/24/21 1516 09/24/21 1715  BP: (!) 89/51 107/60 107/60 (!) 120/52  Pulse: (!) 105 100 100 (!) 102  Resp: _0 Temp: 99.6 F (37.6 C) 99.4 F (37.4 C) 99.4 F (37.4 C) 98.1 F (36.7 C)  TempSrc:   Oral Oral  SpO2: 92%  93% 93%  Weight:      Height:        General: right eye subconjunctival hemorrhage, Pt is alert, awake, not in acute distress Cardiovascular: RRR, S1/S2 +, no rubs, no gallops Respiratory: CTA bilaterally, no wheezing, no rhonchi Abdominal: Soft, NT, ND, bowel sounds + Extremities: no edema, no cyanosis   The results of significant diagnostics from this  hospitalization (including imaging, microbiology, ancillary and laboratory) are listed below for reference.     Microbiology: Recent Results (from the past 240 hour(s))  Culture, blood (Routine X 2) w Reflex to ID Panel     Status: None (Preliminary result)   Collection Time: 09/23/21  6:30 AM   Specimen: BLOOD  Result Value Ref Range Status   Specimen Description   Final    BLOOD RIGHT ANTECUBITAL Performed at [facility], [place] [address], [place], [place] [address]    Special Requests   Final    BOTTLES DRAWN AEROBIC AND ANAEROBIC Blood Culture adequate volume Performed at [facility], [place] [address] [place], [place] [address]    Culture   Final    NO GROWTH 1 DAY Performed at [facility], [address] [place], [place] [address]    Report Status PENDING  Incomplete  Culture, blood (Routine X 2) w Reflex to ID Panel     Status: None (Preliminary result)   Collection Time: 09/23/21 11:04 AM   Specimen: BLOOD  Result Value Ref Range Status   Specimen Description BLOOD RIGHT ANTECUBITAL  Final   Special Requests   Final    BOTTLES DRAWN AEROBIC AND ANAEROBIC Blood Culture adequate volume   Culture   Final    NO GROWTH < 24 HOURS Performed at [facility], [address] [place], [place] [address]    Report Status PENDING  Incomplete     Labs: BNP (last 3 results) No results for input(s): "BNP" in the last 8760 hours. Basic Metabolic Panel: Recent Labs  Lab 09/22/21 0942 09/23/21 0631 09/24/21 0556  NA 130* 133* 137  K 3.9 3.6 4.1  CL 91* 97* 103  CO2 _1 GLUCOSE 124* 110* 102*  BUN 26* 29* 15  CREATININE 1.65* 1.45* 0.79  CALCIUM 9.2 8.5* 7.7*  MG 2.0 2.1 1.9   Liver Function Tests: Recent Labs  Lab 09/22/21 0942  AST 16  ALT 16  ALKPHOS 76  BILITOT 0.8  PROT 6.4*  ALBUMIN 3.2*   No results for input(s): "LIPASE", "AMYLASE"  in the last 168 hours. No results for input(s): "AMMONIA" in the last  168 hours. CBC: Recent Labs  Lab 09/22/21 0942 09/23/21 0631 09/24/21 0556  WBC 2.6* 1.9* 1.8*  NEUTROABS 1.7  --   --   HGB 5.0* 8.5* 7.5*  HCT 15.3* 25.1* 23.9*  MCV 94.4 89.6 95.6  PLT 42* 30* 28*   Cardiac Enzymes: No results for input(s): "CKTOTAL", "CKMB", "CKMBINDEX", "TROPONINI" in the last 168 hours. BNP: Invalid input(s): "POCBNP" CBG: No results for input(s): "GLUCAP" in the last 168 hours. D-Dimer No results for input(s): "DDIMER" in the last 72 hours. Hgb A1c No results for input(s): "HGBA1C" in the last 72 hours. Lipid Profile No results for input(s): "CHOL", "HDL", "LDLCALC", "TRIG", "CHOLHDL", "LDLDIRECT" in the last 72 hours. Thyroid function studies Recent Labs    09/23/21 0630  TSH 3.626   Anemia work up Recent Labs    09/23/21 0630 09/23/21 1104  VITAMINB12  --  8,465*  FOLATE 15.5  --    Urinalysis    Component Value Date/Time   COLORURINE YELLOW 05/11/2019 [place] 05/11/2019 0819   LABSPEC 1.021 05/11/2019 0819   PHURINE 5.0 05/11/2019 0819   GLUCOSEU NEGATIVE 05/11/2019 0819   HGBUR MODERATE (A) 05/11/2019 0819   BILIRUBINUR NEGATIVE 05/11/2019 0819   KETONESUR NEGATIVE 05/11/2019 0819   PROTEINUR NEGATIVE 05/11/2019 0819   UROBILINOGEN 0.2 10/02/2008 0830   NITRITE NEGATIVE 05/11/2019 0819   LEUKOCYTESUR NEGATIVE 05/11/2019 0819   Sepsis Labs Recent Labs  Lab 09/22/21 0942 09/23/21 0631 09/24/21 0556  WBC 2.6* 1.9* 1.8*   Microbiology Recent Results (from the past 240 hour(s))  Culture, blood (Routine X 2) w Reflex to ID Panel     Status: None (Preliminary result)   Collection Time: 09/23/21  6:30 AM   Specimen: BLOOD  Result Value Ref Range Status   Specimen Description   Final    BLOOD RIGHT ANTECUBITAL Performed at [facility], [place] [address] [place], [place] [address]    Special Requests   Final    BOTTLES DRAWN AEROBIC AND ANAEROBIC Blood Culture adequate  volume Performed at [facility], [place] [address] [place], [place] [address]    Culture   Final    NO GROWTH 1 DAY Performed at [facility], [address] [place], [place] [address]    Report Status PENDING  Incomplete  Culture, blood (Routine X 2) w Reflex to ID Panel     Status: None (Preliminary result)   Collection Time: 09/23/21 11:04 AM   Specimen: BLOOD  Result Value Ref Range Status   Specimen Description BLOOD RIGHT ANTECUBITAL  Final   Special Requests   Final    BOTTLES DRAWN AEROBIC AND ANAEROBIC Blood Culture adequate volume   Culture   Final    NO GROWTH < 24 HOURS Performed at [facility], [address] [place], [place] [address]    Report Status PENDING  Incomplete   Time coordinating discharge: 35 mins   SIGNED:  Irwin Brakeman, MD  [facility] 09/24/2021, 5:30 PM How to contact the [facility] Attending or Consulting provider [place] or covering provider during after hours [place], for this patient?  Check the care team in [facility] and look for a) attending/consulting TR[facility] provider listed and b) the [facility] team listed Log into [email or web address] and use [place]'s universal password to access. If you do not have the password, please contact the hospital operator. Locate  the [facility] provider you are looking for under [facility] and page to a number that you can be directly reached. If you still have difficulty reaching the provider, please page the [facility] (Director on Call) for the Hospitalists listed on amion for assistance.

## 2021-09-24 NOTE — Assessment & Plan Note (Addendum)
-   platelet down to 28 today from 30 yesterday, also with right eye subconjunctival hemorrhage, discussed with hematologist Dr. Delton Coombes who said to give 1 unit of platelets and can discharge home today and he will follow up with her outpatient with close outpatient visit.  Pt discharging after platelet transfusion completed.

## 2021-09-24 NOTE — TOC Transition Note (Signed)
Transition of Care [facility]) - CM/SW Discharge Note   Patient Details  Name: Courtney Arnold MRN: [id number] Date of Birth: April 03, 1940  [facility]) CM/SW Contact:  Ihor Gully, LCSW Phone Number: 09/24/2021, 2:31 PM   Clinical Narrative:    3n1 to be dropped shipped via [employer] to patient.    Final next level of care: Home/Self Care Barriers to Discharge: Barriers Resolved   Patient Goals and CMS Choice        Discharge Placement                       Discharge Plan and Services                DME Arranged: 3-N-1 DME Agency: [facility] Date DME Agency Contacted: 09/24/21 Time DME Agency Contacted: [id number] Representative spoke with at DME Agency: [place] ([place]) Interventions     Readmission Risk Interventions     No data to display

## 2021-09-25 ENCOUNTER — Encounter (HOSPITAL_COMMUNITY): Payer: Self-pay | Admitting: Hematology

## 2021-09-25 LAB — PREPARE PLATELET PHERESIS: Unit division: 0

## 2021-09-25 LAB — BPAM PLATELET PHERESIS
Blood Product Expiration Date: 202307232359
ISSUE DATE / TIME: 202307201453
Unit Type and Rh: 6200

## 2021-09-27 ENCOUNTER — Inpatient Hospital Stay (HOSPITAL_COMMUNITY): Payer: Medicare Other

## 2021-09-27 ENCOUNTER — Other Ambulatory Visit (HOSPITAL_COMMUNITY): Payer: Self-pay

## 2021-09-27 ENCOUNTER — Other Ambulatory Visit: Payer: Self-pay

## 2021-09-27 ENCOUNTER — Inpatient Hospital Stay (HOSPITAL_COMMUNITY)
Admission: EM | Admit: 2021-09-27 | Discharge: 2021-10-02 | DRG: 150 | Disposition: A | Discharge status: Home / Self Care | Payer: Medicare Other | Attending: Internal Medicine | Admitting: Internal Medicine

## 2021-09-27 ENCOUNTER — Encounter (HOSPITAL_COMMUNITY): Payer: Self-pay

## 2021-09-27 ENCOUNTER — Emergency Department (HOSPITAL_COMMUNITY): Payer: Medicare Other

## 2021-09-27 DIAGNOSIS — D849 Immunodeficiency, unspecified: Secondary | ICD-10-CM | POA: Diagnosis not present

## 2021-09-27 DIAGNOSIS — J189 Pneumonia, unspecified organism: Secondary | ICD-10-CM | POA: Diagnosis not present

## 2021-09-27 DIAGNOSIS — Z87891 Personal history of nicotine dependence: Secondary | ICD-10-CM | POA: Diagnosis not present

## 2021-09-27 DIAGNOSIS — D61818 Other pancytopenia: Secondary | ICD-10-CM

## 2021-09-27 DIAGNOSIS — E8809 Other disorders of plasma-protein metabolism, not elsewhere classified: Secondary | ICD-10-CM | POA: Diagnosis present

## 2021-09-27 DIAGNOSIS — Z8249 Family history of ischemic heart disease and other diseases of the circulatory system: Secondary | ICD-10-CM

## 2021-09-27 DIAGNOSIS — B370 Candidal stomatitis: Secondary | ICD-10-CM | POA: Diagnosis present

## 2021-09-27 DIAGNOSIS — Z20822 Contact with and (suspected) exposure to covid-19: Secondary | ICD-10-CM | POA: Diagnosis present

## 2021-09-27 DIAGNOSIS — E876 Hypokalemia: Secondary | ICD-10-CM | POA: Diagnosis present

## 2021-09-27 DIAGNOSIS — X58XXXA Exposure to other specified factors, initial encounter: Secondary | ICD-10-CM | POA: Diagnosis present

## 2021-09-27 DIAGNOSIS — D696 Thrombocytopenia, unspecified: Secondary | ICD-10-CM | POA: Diagnosis present

## 2021-09-27 DIAGNOSIS — R1110 Vomiting, unspecified: Secondary | ICD-10-CM | POA: Diagnosis not present

## 2021-09-27 DIAGNOSIS — Z823 Family history of stroke: Secondary | ICD-10-CM | POA: Diagnosis not present

## 2021-09-27 DIAGNOSIS — R5381 Other malaise: Secondary | ICD-10-CM | POA: Diagnosis not present

## 2021-09-27 DIAGNOSIS — D62 Acute posthemorrhagic anemia: Secondary | ICD-10-CM | POA: Diagnosis not present

## 2021-09-27 DIAGNOSIS — T451X5A Adverse effect of antineoplastic and immunosuppressive drugs, initial encounter: Secondary | ICD-10-CM | POA: Diagnosis present

## 2021-09-27 DIAGNOSIS — I480 Paroxysmal atrial fibrillation: Secondary | ICD-10-CM | POA: Diagnosis present

## 2021-09-27 DIAGNOSIS — Z803 Family history of malignant neoplasm of breast: Secondary | ICD-10-CM | POA: Diagnosis not present

## 2021-09-27 DIAGNOSIS — J90 Pleural effusion, not elsewhere classified: Secondary | ICD-10-CM | POA: Diagnosis present

## 2021-09-27 DIAGNOSIS — D649 Anemia, unspecified: Secondary | ICD-10-CM

## 2021-09-27 DIAGNOSIS — Z9049 Acquired absence of other specified parts of digestive tract: Secondary | ICD-10-CM | POA: Diagnosis not present

## 2021-09-27 DIAGNOSIS — C9002 Multiple myeloma in relapse: Secondary | ICD-10-CM | POA: Diagnosis present

## 2021-09-27 DIAGNOSIS — R040 Epistaxis: Principal | ICD-10-CM | POA: Diagnosis present

## 2021-09-27 DIAGNOSIS — R918 Other nonspecific abnormal finding of lung field: Secondary | ICD-10-CM | POA: Diagnosis not present

## 2021-09-27 DIAGNOSIS — Z79890 Hormone replacement therapy (postmenopausal): Secondary | ICD-10-CM

## 2021-09-27 DIAGNOSIS — Z66 Do not resuscitate: Secondary | ICD-10-CM | POA: Diagnosis present

## 2021-09-27 DIAGNOSIS — E039 Hypothyroidism, unspecified: Secondary | ICD-10-CM | POA: Diagnosis not present

## 2021-09-27 DIAGNOSIS — D61810 Antineoplastic chemotherapy induced pancytopenia: Secondary | ICD-10-CM | POA: Diagnosis present

## 2021-09-27 DIAGNOSIS — A419 Sepsis, unspecified organism: Secondary | ICD-10-CM

## 2021-09-27 DIAGNOSIS — D6959 Other secondary thrombocytopenia: Secondary | ICD-10-CM | POA: Diagnosis not present

## 2021-09-27 DIAGNOSIS — Z79899 Other long term (current) drug therapy: Secondary | ICD-10-CM

## 2021-09-27 DIAGNOSIS — I10 Essential (primary) hypertension: Secondary | ICD-10-CM | POA: Diagnosis present

## 2021-09-27 DIAGNOSIS — J690 Pneumonitis due to inhalation of food and vomit: Secondary | ICD-10-CM | POA: Diagnosis present

## 2021-09-27 LAB — COMPREHENSIVE METABOLIC PANEL
ALT: 15 U/L (ref 0–44)
AST: 16 U/L (ref 15–41)
Albumin: 2.3 g/dL — ABNORMAL LOW (ref 3.5–5.0)
Alkaline Phosphatase: 100 U/L (ref 38–126)
Anion gap: 8 (ref 5–15)
BUN: 23 mg/dL (ref 8–23)
CO2: 28 mmol/L (ref 22–32)
Calcium: 7.5 mg/dL — ABNORMAL LOW (ref 8.9–10.3)
Chloride: 97 mmol/L — ABNORMAL LOW (ref 98–111)
Creatinine, Ser: 0.94 mg/dL (ref 0.44–1.00)
GFR, Estimated: >60 mL/min (ref >60)
Glucose, Bld: 134 mg/dL — ABNORMAL HIGH (ref 70–99)
Potassium: 3.3 mmol/L — ABNORMAL LOW (ref 3.5–5.1)
Sodium: 133 mmol/L — ABNORMAL LOW (ref 135–145)
Total Bilirubin: 0.9 mg/dL (ref 0.3–1.2)
Total Protein: 5.3 g/dL — ABNORMAL LOW (ref 6.5–8.1)

## 2021-09-27 LAB — CBC
HCT: 19 % — ABNORMAL LOW (ref 36.0–46.0)
Hemoglobin: 6.2 g/dL — CL (ref 12.0–15.0)
MCH: 30.4 pg (ref 26.0–34.0)
MCHC: 32.6 g/dL (ref 30.0–36.0)
MCV: 93.1 fL (ref 80.0–100.0)
Platelets: 36 10*3/uL — ABNORMAL LOW (ref 150–400)
RBC: 2.04 MIL/uL — ABNORMAL LOW (ref 3.87–5.11)
RDW: 16.7 % — ABNORMAL HIGH (ref 11.5–15.5)
WBC: 3.2 10*3/uL — ABNORMAL LOW (ref 4.0–10.5)
nRBC: 0 % (ref 0.0–0.2)

## 2021-09-27 LAB — APTT: aPTT: 23 seconds — ABNORMAL LOW (ref 24–36)

## 2021-09-27 LAB — PROTIME-INR
INR: 1.1 (ref 0.8–1.2)
Prothrombin Time: 13.7 seconds (ref 11.4–15.2)

## 2021-09-27 LAB — PREPARE RBC (CROSSMATCH)

## 2021-09-27 LAB — SARS CORONAVIRUS 2 BY RT PCR: SARS Coronavirus 2 by RT PCR: NEGATIVE

## 2021-09-27 LAB — LACTIC ACID, PLASMA: Lactic Acid, Venous: 1.3 mmol/L (ref 0.5–1.9)

## 2021-09-27 MED ORDER — SODIUM CHLORIDE 0.9 % IV SOLN: INTRAVENOUS | Status: AC

## 2021-09-27 MED ORDER — PIPERACILLIN-TAZOBACTAM 3.375 G IVPB: 3.3750 g | Freq: Three times a day (TID) | INTRAVENOUS | Status: DC

## 2021-09-27 MED ORDER — SODIUM CHLORIDE 0.9 % IV SOLN: 3.0000 g | Freq: Three times a day (TID) | INTRAVENOUS | Status: DC

## 2021-09-27 MED ORDER — VANCOMYCIN HCL 1250 MG/250ML IV SOLN
1250.0000 mg | Freq: Once | INTRAVENOUS | Status: AC
Start: 2021-09-27 — End: 2021-09-28
  Administered 2021-09-28: 1250 mg via INTRAVENOUS
  Filled 2021-09-27: qty 250

## 2021-09-27 MED ORDER — ACETAMINOPHEN 325 MG PO TABS: 650.0000 mg | ORAL_TABLET | Freq: Four times a day (QID) | ORAL | Status: DC | PRN

## 2021-09-27 MED ORDER — POTASSIUM CHLORIDE CRYS ER 20 MEQ PO TBCR
40.0000 meq | EXTENDED_RELEASE_TABLET | Freq: Once | ORAL | Status: AC
  Administered 2021-09-27: 40 meq via ORAL
  Filled 2021-09-27: qty 2

## 2021-09-27 MED ORDER — SODIUM CHLORIDE 0.9% IV SOLUTION: Freq: Once | INTRAVENOUS | Status: AC

## 2021-09-27 MED ORDER — SODIUM CHLORIDE 0.9 % IV SOLN
2.0000 g | INTRAVENOUS | Status: DC
  Administered 2021-09-27: 2 g via INTRAVENOUS
  Filled 2021-09-27: qty 20

## 2021-09-27 MED ORDER — ACYCLOVIR 400 MG PO TABS
400.0000 mg | ORAL_TABLET | Freq: Two times a day (BID) | ORAL | Status: DC
  Administered 2021-09-28 – 2021-10-01 (×8): 400 mg via ORAL
  Filled 2021-09-27 (×11): qty 1

## 2021-09-27 MED ORDER — SODIUM CHLORIDE 0.9 % IV BOLUS
1000.0000 mL | Freq: Once | INTRAVENOUS | Status: AC
  Administered 2021-09-27: 1000 mL via INTRAVENOUS

## 2021-09-27 MED ORDER — OXYMETAZOLINE HCL 0.05 % NA SOLN
1.0000 | Freq: Once | NASAL | Status: DC
Start: 2021-09-27 — End: 2021-09-29
  Filled 2021-09-27: qty 30

## 2021-09-27 MED ORDER — ONDANSETRON HCL 4 MG/2ML IJ SOLN: 4.0000 mg | Freq: Four times a day (QID) | INTRAMUSCULAR | Status: DC | PRN

## 2021-09-27 MED ORDER — SODIUM CHLORIDE 0.9 % IV SOLN
500.0000 mg | INTRAVENOUS | Status: DC
  Administered 2021-09-28: 500 mg via INTRAVENOUS
  Filled 2021-09-27: qty 5

## 2021-09-27 MED ORDER — SODIUM CHLORIDE 0.9 % IV SOLN: 10.0000 mL/h | Freq: Once | INTRAVENOUS | Status: DC

## 2021-09-27 MED ORDER — VANCOMYCIN HCL 750 MG/150ML IV SOLN: 750.0000 mg | INTRAVENOUS | Status: DC

## 2021-09-27 MED ORDER — LEVOTHYROXINE SODIUM 25 MCG PO TABS
137.0000 ug | ORAL_TABLET | Freq: Every day | ORAL | Status: DC
  Administered 2021-09-28 – 2021-10-02 (×5): 137 ug via ORAL
  Filled 2021-09-27 (×5): qty 1

## 2021-09-27 MED ORDER — ACETAMINOPHEN 650 MG RE SUPP: 650.0000 mg | Freq: Four times a day (QID) | RECTAL | Status: DC | PRN

## 2021-09-27 MED ORDER — SODIUM CHLORIDE 0.9% IV SOLUTION: Freq: Once | INTRAVENOUS | Status: DC

## 2021-09-27 NOTE — ED Notes (Signed)
Per Lynnae Sandhoff, [place] patient is unable to receive our blood here at [facility] due to hx of transfusion rxn.

## 2021-09-27 NOTE — Progress Notes (Addendum)
Pharmacy Antibiotic Note  Courtney Arnold is a 81 y.o. female admitted on 09/27/2021 presenting from [facility] with nose bleed, hx multiple myeloma, concern for pna.  Pharmacy has been consulted for vancomycin and zosyn dosing.  Plan: Vancomycin 1250 mg IV x 1, then 750 mg IV q24h (eAUC 479, SCr 0.94) Zosyn 3.375g IV q 8h (extended 4h infusion) Add MRSA PCR Monitor renal function, Cx/PCR to narrow Vancomycin levels as needed  Height: _0  (157.5 cm) Weight: 60.8 kg (134 lb 0.6 oz) IBW/kg (Calculated) : 50.1  Temp (24hrs), Avg:98.4 F (36.9 C), Min:97.8 F (36.6 C), Max:98.8 F (37.1 C)  Recent Labs  Lab 09/22/21 0942 09/23/21 0631 09/24/21 0556 09/27/21 1700  WBC 2.6* 1.9* 1.8* 3.2*  CREATININE 1.65* 1.45* 0.79 0.94  LATICACIDVEN  --   --   --  1.3    Estimated Creatinine Clearance: 41 mL/min (by C-G formula based on SCr of 0.94 mg/dL).    No Known Allergies  Bertis Ruddy, PharmD Clinical Pharmacist ED Pharmacist Phone # [phone] 09/27/2021 9:30 PM  Addendum  D/w Dr Marlowe Sax, we will change zosyn to ceftriaxone and add back azithromycin.   Ceftriaxone 2g IV q24 Azithromycin 543m IV q24  MOnnie Boer PharmD, [place] AAHIVP, CPP Infectious Disease Pharmacist 09/27/2021 9:59 PM

## 2021-09-27 NOTE — ED Notes (Signed)
Dr Constance Holster paged to Dr Sabra Heck

## 2021-09-27 NOTE — ED Notes (Signed)
Labs ordered at previous facility acknowledged and collected, not drawn by this RN but drawn at previous facility.

## 2021-09-27 NOTE — ED Provider Notes (Signed)
This is a critically ill-appearing 81 year old female presenting to the hospital with a nosebleed.  The patient's pertinent medical history includes multiple myeloma with pancytopenia requiring recurrent blood transfusions in fact she had a blood transfusion several days ago for severe anemia.  She states that the bleeding started from her left nostril, it was rapid, there was a voluminous amount according to the daughter who accompanies the patient at the bedside.  The patient is DNR and does not want to be placed on life support.  The patient reports that the bleeding seems to be coming from both nostrils but predominantly the left.  The patient was recently treated for pneumonia and states that she is still little bit short of breath and appears mildly tachypneic.  Blood pressure is 750 systolic, heart rate is 518 and a sinus tachycardia  The patient is pale, weak pulses, no edema, clear lungs.  On ENT exam the patient has blood in the posterior oropharynx, the right nostril is near completely occluded from what appears to be a deviated septum and tissue.  The left nostril was widely patent and there does appear to be a large nasal septal defect.  There is no obvious source of active bleeding though there is some blood which is bright red laying in the nasal cavity.  I placed a posterior nasal pack in the left cavity and inflated, there is was significantly uncomfortable for the patient but she eventually tolerated it.  This caused a large amount of blood to be pushed into the posterior pharynx for which she immediately vomited up.  The patient appears to be unstable, she will need to be transferred to a major center.  I did discuss the care with the consultant Dr. Elie Goody who is excepted the patient to be seen in consultation, the patient will likely need to be admitted to the medical service due to the constellation of medical complications including her pancytopenia and need for ongoing blood  transfusions.  I had a discussion with the patient and her daughter, they are staunchly DNR and do not want any aggressive or heroic measures outside of an ENT evaluation to stop the bleeding and blood transfusions as needed.  I discussed case with Dr. Darl Householder - who will accept ED to ED at [facility] hospital.  Pt unable to be given blood products prior to transfer due to need for expedited need for specialty care not available here.  Critical care provided  .Critical Care  Performed by: Noemi Chapel, MD Authorized by: Noemi Chapel, MD   Critical care provider statement:    Critical care time (minutes):  30   Critical care time was exclusive of:  Separately billable procedures and treating other patients and teaching time   Critical care was necessary to treat or prevent imminent or life-threatening deterioration of the following conditions: hemorrhage / pancytopenia / severe anemia.   Critical care was time spent personally by me on the following activities:  Development of treatment plan with patient or surrogate, discussions with consultants, evaluation of patient's response to treatment, examination of patient, ordering and review of laboratory studies, ordering and review of radiographic studies, ordering and performing treatments and interventions, pulse oximetry, re-evaluation of patient's condition and review of old charts  Final diagnoses:  Epistaxis  Severe anemia  Pancytopenia ([place])      Noemi Chapel, MD 09/27/21 [phone]

## 2021-09-27 NOTE — ED Triage Notes (Addendum)
Pt arrives today with a nosebleed since lunchtime. Pt was seen received blood Thursday; platelets Friday.  Clots noted with family. Bleeding mostly out of left nostril Pt is [place] pt.

## 2021-09-27 NOTE — H&P (Signed)
History and Physical    Courtney Arnold:[id number] DOB: 12-08-1940 DOA: 09/27/2021  PCP: [name], MD  Patient coming from: Home  Chief Complaint: Nosebleed  HPI: Courtney Arnold is a 81 y.o. female with medical history significant of relapsed IgA kappa plasma cell myeloma, hypothyroidism, paroxysmal A-fib, hypertension.  Recently admitted at [facility] from 7/18-7/20 for pancytopenia in the setting of recently being started on daratumumab with dexamethasone for her myeloma.  Presented with hemoglobin 5.0, WBC 2.6, platelets 42,000.  She was transfused with 2 units PRBCs and hemoglobin 7.5 on the day of discharge.  Platelet count dropped to 28k on the day of discharge and was given 1 unit of platelets.  WBC 1.8 on the day of discharge.  Work-up also suggestive of pneumonia and she was treated with IV antibiotics and discharged on oral doxycycline 100 mg twice daily x3 more days.  Blood cultures showing NGTD.  Also presented with creatinine 1.6 (baseline 0.7-0.8) and treated with IV fluids with creatinine trending down to baseline.  Patient returned to [facility] ED today with epistaxis (bleeding from left nostril).  On arrival to the ED, patient was afebrile but tachycardic and tachypneic with soft blood pressure.  She was satting 93% on room air.  Labs significant for WBC 3.2, hemoglobin 6.2, platelet count 36k.  Sodium 133, potassium 3.3, chloride 97, bicarb 28, BUN 23, creatinine 0.9, glucose 134.  Calcium 8.9 when corrected for hypoalbuminemia.  INR 1.1.  Lactic acid 1.3.  Blood cultures drawn.  Chest x-ray showing significant interval increase in heterogeneous and consolidative airspace opacity of the left apex concerning for infection. Posterior nasal packing was placed and patient transferred to [facility] ED for evaluation by ENT. At [facility] ED, 2 units PRBCs ordered.  Bleeding had stopped and ED physician discussed the case with [name] from ENT who will consult in the  morning.  History provided by the patient and her 2 daughters at bedside.  Daughter states patient has had nosebleeds for several months but this afternoon after lunch she started bleeding from her left nostril and the bleeding would not stop.  The bleeding was spontaneous and not the result of an injury to her nose.  Daughter is concerned that a lot of blood came out and patient swallowed blood and vomited afterwards.  States she was previously on aspirin 81 mg daily but it was stopped during recent hospitalization at [facility].  She was prescribed doxycycline for pneumonia and finished treatment course today.  Patient denies dizziness, headaches, shortness of breath, chest pain, or abdominal pain.  No other complaints.  Review of Systems:  Review of Systems  All other systems reviewed and are negative.   Past Medical History:  Diagnosis Date   Atrial fibrillation ([place]) 2011   Postop, spontaneous conversion to normal sinus after one hour   Compression fracture 07/24/2009   T12; kyphoplasty   History of echocardiogram 07/2009   EF 65%   Hypertension    Thyroid disease    Tobacco abuse     Past Surgical History:  Procedure Laterality Date   BACK SURGERY     BACK SURGERY  06/06/2015   BREAST EXCISIONAL BIOPSY Left    50 years ago  benign   CHOLECYSTECTOMY N/A 04/25/2015   Procedure: LAPAROSCOPIC CHOLECYSTECTOMY WITH INTRAOPERATIVE CHOLANGIOGRAM;  Surgeon: Mickeal Skinner, MD;  Location: [facility] ORS;  Service: General;  Laterality: N/A;   COLONOSCOPY N/A 11/26/2015   Procedure: COLONOSCOPY;  Surgeon: Cristopher Estimable  Rourk, MD;  Location: [facility] ENDO SUITE;  Service: Endoscopy;  Laterality: N/A;  7:30 am   ERCP N/A 04/14/2015   Procedure: ENDOSCOPIC RETROGRADE CHOLANGIOPANCREATOGRAPHY (ERCP) Biliary Sphincterotomy, 10x7 stent placement Dilated bilary system just not well seen;  Surgeon: Rogene Houston, MD;  Location: [facility] ORS;  Service: Endoscopy;  Laterality: N/A;   ERCP N/A 06/12/2015   Procedure:  ENDOSCOPIC RETROGRADE CHOLANGIOPANCREATOGRAPHY (ERCP);  Surgeon: Rogene Houston, MD;  Location: [facility];  Service: Endoscopy;  Laterality: N/A;   ESOPHAGOGASTRODUODENOSCOPY N/A 06/12/2015   Procedure: DIAGNOSTIC ESOPHAGOGASTRODUODENOSCOPY (EGD);  Surgeon: Rogene Houston, MD;  Location: [facility];  Service: Endoscopy;  Laterality: N/A;   FEMUR IM NAIL Right 05/11/2019   Procedure: RETROGRADE INTRAMEDULLARY NAIL FEMORAL;  Surgeon: Shona Needles, MD;  Location: [place];  Service: Orthopedics;  Laterality: Right;   STENT REMOVAL  06/12/2015   Procedure: STENT REMOVAL ;  Surgeon: Rogene Houston, MD;  Location: [facility];  Service: Endoscopy;;     reports that she quit smoking about 8 years ago. Her smoking use included cigarettes. She has a 3.00 pack-year smoking history. She has never used smokeless tobacco. She reports that she does not drink alcohol and does not use drugs.  No Known Allergies  Family History  Problem Relation Age of Onset   Heart attack Father    Stroke Father    Breast cancer Sister    Thyroid disease Neg Hx    Colon cancer Neg Hx     Prior to Admission medications   Medication Sig Start Date End Date Taking? Authorizing Provider  acyclovir (ZOVIRAX) 400 MG tablet TAKE (1) TABLET BY MOUTH TWICE DAILY. Patient taking differently: Take 400 mg by mouth 2 (two) times daily. 05/07/21  Yes Derek Jack, MD  CALCIUM PO Take 1 capsule by mouth daily.   Yes [provider]  carboxymethylcellulose (REFRESH PLUS) 0.5 % SOLN Place 1 drop into both eyes in the morning, at noon, in the evening, and at bedtime.   Yes [provider]  Cholecalciferol (VITAMIN D) 125 MCG (5000 UT) CAPS Take 5,000 Units by mouth daily. 05/14/19  Yes McClung, Sarah A, PA-C  Cyanocobalamin (B-12 PO) Take 1 capsule by mouth daily.   Yes [provider]  doxycycline (VIBRAMYCIN) 100 MG capsule Take 1 capsule (100 mg total) by mouth 2 (two) times daily for 3 days.  09/25/21 09/28/21 Yes Johnson, Clanford L, MD  feeding supplement (ENSURE ENLIVE / ENSURE PLUS) LIQD Take 237 mLs by mouth 2 (two) times daily between meals. 09/25/21 11/24/21 Yes Johnson, Clanford L, MD  Ferrous Sulfate (IRON PO) Take 1 capsule by mouth daily.   Yes [provider]  levothyroxine (SYNTHROID) 137 MCG tablet Take 1 tablet (137 mcg total) by mouth daily before breakfast. 02/17/21  Yes Renato Shin, MD  Multiple Vitamin (MULTIVITAMIN WITH MINERALS) TABS tablet Take 1 tablet by mouth daily. 09/25/21  Yes Johnson, Clanford L, MD  ondansetron (ZOFRAN) 8 MG tablet Take 1 tablet (8 mg total) by mouth every 8 (eight) hours as needed for nausea or vomiting. 09/21/21  Yes Derek Jack, MD  POTASSIUM PO Take 1 capsule by mouth daily.   Yes [provider]  dexamethasone (DECADRON) 4 MG tablet TAKE 2 TABLETS BY MOUTH once weekly Patient not taking: Reported on 09/27/2021 06/30/21   Derek Jack, MD  SSD 1 % cream Apply 1 Application topically daily. Patient not taking: Reported on 09/27/2021 06/10/21   [provider]  Physical Exam: Vitals:   09/27/21 1807 09/27/21 1857 09/27/21 1858 09/27/21 1859  BP: (!) 100/40  (!) 112/50   Pulse: (!) 106  (!) 108   Resp: 15  20   Temp: 98.7 F (37.1 C)  98.8 F (37.1 C)   TempSrc: Oral  Oral   SpO2: 92% 95% 95%   Weight:    60.8 kg  Height:    '5\' 2"'$  (1.575 m)    Physical Exam Vitals reviewed.  Constitutional:      General: She is not in acute distress. HENT:     Head: Normocephalic and atraumatic.     Nose:     Comments: Left nostril nasal packing in place, no active epistaxis Eyes:     Extraocular Movements: Extraocular movements intact.  Cardiovascular:     Rate and Rhythm: Normal rate and regular rhythm.     Pulses: Normal pulses.  Pulmonary:     Breath sounds: No wheezing or rales.     Comments: Tachypneic Abdominal:     General: Bowel sounds are normal. There is no distension.      Palpations: Abdomen is soft.     Tenderness: There is no abdominal tenderness.  Musculoskeletal:        General: No swelling or tenderness.     Cervical back: Normal range of motion.  Skin:    General: Skin is warm and dry.  Neurological:     General: No focal deficit present.     Mental Status: She is alert and oriented to person, place, and time.      Labs on Admission: I have personally reviewed following labs and imaging studies  CBC: Recent Labs  Lab 09/22/21 0942 09/23/21 0631 09/24/21 0556 09/27/21 1700  WBC 2.6* 1.9* 1.8* 3.2*  NEUTROABS 1.7  --   --   --   HGB 5.0* 8.5* 7.5* 6.2*  HCT 15.3* 25.1* 23.9* 19.0*  MCV 94.4 89.6 95.6 93.1  PLT 42* 30* 28* 36*   Basic Metabolic Panel: Recent Labs  Lab 09/22/21 0942 09/23/21 0631 09/24/21 0556 09/27/21 1700  NA 130* 133* 137 133*  K 3.9 3.6 4.1 3.3*  CL 91* 97* 103 97*  CO2 '28 27 27 28  '$ GLUCOSE 124* 110* 102* 134*  BUN 26* 29* 15 23  CREATININE 1.65* 1.45* 0.79 0.94  CALCIUM 9.2 8.5* 7.7* 7.5*  MG 2.0 2.1 1.9  --    GFR: Estimated Creatinine Clearance: 41 mL/min (by C-G formula based on SCr of 0.94 mg/dL). Liver Function Tests: Recent Labs  Lab 09/22/21 0942 09/27/21 1700  AST 16 16  ALT 16 15  ALKPHOS 76 100  BILITOT 0.8 0.9  PROT 6.4* 5.3*  ALBUMIN 3.2* 2.3*   No results for input(s): "LIPASE", "AMYLASE" in the last 168 hours. No results for input(s): "AMMONIA" in the last 168 hours. Coagulation Profile: Recent Labs  Lab 09/27/21 1700  INR 1.1   Cardiac Enzymes: No results for input(s): "CKTOTAL", "CKMB", "CKMBINDEX", "TROPONINI" in the last 168 hours. BNP (last 3 results) No results for input(s): "PROBNP" in the last 8760 hours. HbA1C: No results for input(s): "HGBA1C" in the last 72 hours. CBG: No results for input(s): "GLUCAP" in the last 168 hours. Lipid Profile: No results for input(s): "CHOL", "HDL", "LDLCALC", "TRIG", "CHOLHDL", "LDLDIRECT" in the last 72 hours. Thyroid Function  Tests: No results for input(s): "TSH", "T4TOTAL", "FREET4", "T3FREE", "THYROIDAB" in the last 72 hours. Anemia Panel: No results for input(s): "VITAMINB12", "FOLATE", "FERRITIN", "TIBC", "IRON", "RETICCTPCT"  in the last 72 hours. Urine analysis:    Component Value Date/Time   COLORURINE YELLOW 05/11/2019 [place] 05/11/2019 0819   LABSPEC 1.021 05/11/2019 0819   PHURINE 5.0 05/11/2019 0819   GLUCOSEU NEGATIVE 05/11/2019 0819   HGBUR MODERATE (A) 05/11/2019 0819   BILIRUBINUR NEGATIVE 05/11/2019 0819   KETONESUR NEGATIVE 05/11/2019 0819   PROTEINUR NEGATIVE 05/11/2019 0819   UROBILINOGEN 0.2 10/02/2008 0830   NITRITE NEGATIVE 05/11/2019 0819   LEUKOCYTESUR NEGATIVE 05/11/2019 0819    Radiological Exams on Admission: I have personally reviewed images DG Chest Port 1 View  Result Date: 09/27/2021 CLINICAL DATA:  Nosebleed EXAM: PORTABLE CHEST 1 VIEW COMPARISON:  09/22/2021 FINDINGS: The heart size and mediastinal contours are within normal limits. Significant interval increase in heterogeneous and consolidative airspace opacity of the left apex. The visualized skeletal structures are unremarkable. IMPRESSION: Significant interval increase in heterogeneous and consolidative airspace opacity of the left apex, concerning for infection. Consider CT to further evaluate. Electronically Signed   By: Delanna Ahmadi M.D.   On: 09/27/2021 17:57    EKG: Independently reviewed.  Sinus tachycardia.  Assessment and Plan  Epistaxis -In the setting of thrombocytopenia. -Left nare posterior nasal packing placed in the ED and not actively bleeding at this time -ENT will consult in a.m., call tonight if patient starts bleeding again  Thrombocytopenia -Platelet count 36k. - I discussed the case with patient's primary oncologist Dr. Delton Coombes and appreciate his recommendations.  He recommends giving 1 unit of platelets at this time and keeping platelet count >50,000. -Continue to  monitor CBC -No anticoagulation for DVT prophylaxis  Acute blood loss anemia -Hemoglobin 6.2 -2 units PRBCs ordered in the ED -Follow-up posttransfusion H&H and continue to transfuse if Hgb <7 -No anticoagulation for DVT prophylaxis  Sepsis secondary to worsening pneumonia Recently treated with IV antibiotics at [facility] and discharged on doxycycline.  Repeat chest x-ray showing significant interval increase in heterogeneous and consolidative airspace opacity of the left apex concerning for infection.  ?Aspiration event given large volume epistaxis.  Tachycardic and tachypneic, WBC 3.2.  Initial lactate normal and blood pressure stable at present.  Not hypoxic at present. -Vancomycin, ceftriaxone, azithromycin -IV fluid hydration -CT chest without contrast for further evaluation -Strep pneumo/Legionella urinary antigens -Avoiding MRSA nasal swab at this time in the setting thrombocytopenia/epistaxis, can be done later once platelet count improves. -COVID throat swab -Repeat lactate -Blood cultures pending  Mild hypokalemia -Replace potassium -Check magnesium level and replace if low -Monitor electrolytes  Relapsed IgA kappa plasma cell myeloma -Patient pancytopenic in the setting of recently being started on daratumumab with dexamethasone. -Outpatient oncology follow-up -Continue acyclovir for ID prophylaxis  Hypothyroidism -Continue Synthroid  Paroxysmal A-fib Currently rate controlled.  Not on anticoagulation or rate/rhythm control agents. -Cardiac monitoring  DVT prophylaxis: SCDs Code Status: DNR/DNI (discussed with the patient and her 2 daughters at bedside) Family Communication: Daughter is at bedside. Level of care: Progressive Care Unit Admission status: It is my clinical opinion that admission to INPATIENT is reasonable and necessary because of the expectation that this patient will require hospital care that crosses at least 2 midnights to treat this condition  based on the medical complexity of the problems presented.  Given the aforementioned information, the predictability of an adverse outcome is felt to be significant.   Shela Leff MD [facility]  If 7PM-7AM, please contact night-coverage [email or web address]  09/27/2021, 7:50 PM

## 2021-09-27 NOTE — ED Triage Notes (Signed)
Pt BIB [facility] from [facility] due to nosebleed since lunch time that has not stopped.  Pt has taken chemo and platelets are low.  There is currently a rhino rocket in the left nare.  VS 105/80, HR 105, SpO2 98%.  No pain reported.

## 2021-09-27 NOTE — ED Provider Notes (Signed)
[place] EMERGENCY DEPARTMENT Provider Note   CSN: [id number] Arrival date & time: 09/27/21  1605     History  Chief Complaint  Patient presents with   Epistaxis    Courtney Arnold is a 81 y.o. female   Courtney Arnold is an 81 year old female with past medical history of multiple myeloma presenting today for epistaxis.  Her daughter reports that she started bleeding a couple of hours ago and that blood was "all over the kitchen counter and on the floor ".  Bleeding continued for at least an hour which prompted prompted her desire to be seen in the emergency department.  Endorsed shortness of breath. Patient reports that a few days ago she received a blood and platelet transfusion for anemia and thrombocytopenia associated with her multiple myeloma.  Denies chest pain and syncope.   Epistaxis      Home Medications Prior to Admission medications   Medication Sig Start Date End Date Taking? Authorizing Provider  acyclovir (ZOVIRAX) 400 MG tablet TAKE (1) TABLET BY MOUTH TWICE DAILY. Patient taking differently: Take 400 mg by mouth 2 (two) times daily. 05/07/21  Yes Derek Jack, MD  CALCIUM PO Take 1 capsule by mouth daily.   Yes [provider]  carboxymethylcellulose (REFRESH PLUS) 0.5 % SOLN Place 1 drop into both eyes in the morning, at noon, in the evening, and at bedtime.   Yes [provider]  Cholecalciferol (VITAMIN D) 125 MCG (5000 UT) CAPS Take 5,000 Units by mouth daily. 05/14/19  Yes McClung, Sarah A, PA-C  Cyanocobalamin (B-12 PO) Take 1 capsule by mouth daily.   Yes [provider]  doxycycline (VIBRAMYCIN) 100 MG capsule Take 1 capsule (100 mg total) by mouth 2 (two) times daily for 3 days. 09/25/21 09/28/21 Yes Johnson, Clanford L, MD  feeding supplement (ENSURE ENLIVE / ENSURE PLUS) LIQD Take 237 mLs by mouth 2 (two) times daily between meals. 09/25/21 11/24/21 Yes Johnson, Clanford L, MD  Ferrous Sulfate (IRON PO) Take 1  capsule by mouth daily.   Yes [provider]  levothyroxine (SYNTHROID) 137 MCG tablet Take 1 tablet (137 mcg total) by mouth daily before breakfast. 02/17/21  Yes Renato Shin, MD  Multiple Vitamin (MULTIVITAMIN WITH MINERALS) TABS tablet Take 1 tablet by mouth daily. 09/25/21  Yes Johnson, Clanford L, MD  ondansetron (ZOFRAN) 8 MG tablet Take 1 tablet (8 mg total) by mouth every 8 (eight) hours as needed for nausea or vomiting. 09/21/21  Yes Derek Jack, MD  POTASSIUM PO Take 1 capsule by mouth daily.   Yes [provider]  dexamethasone (DECADRON) 4 MG tablet TAKE 2 TABLETS BY MOUTH once weekly Patient not taking: Reported on 09/27/2021 06/30/21   Derek Jack, MD  SSD 1 % cream Apply 1 Application topically daily. Patient not taking: Reported on 09/27/2021 06/10/21   [provider]      Allergies    Patient has no known allergies.    Review of Systems   Review of Systems  HENT:  Positive for nosebleeds.   Respiratory:  Positive for shortness of breath.     Physical Exam Updated Vital Signs BP (!) 112/50 (BP Location: Left Arm)   Pulse (!) 108   Temp 98.8 F (37.1 C) (Oral)   Resp 20   Ht 5' 2"  (1.575 m)   Wt 60.8 kg   SpO2 95%   BMI 24.52 kg/m  Physical Exam Vitals reviewed.  Constitutional:      General: She  is in acute distress.     Appearance: She is ill-appearing.  HENT:     Nose: Septal deviation present.     Right Nostril: Occlusion present.     Left Nostril: Epistaxis present.     Right Turbinates: Enlarged.     Comments: Partially absent nasal septum noted on exam.     Mouth/Throat:     Mouth: Mucous membranes are dry.  Eyes:     Conjunctiva/sclera:     Right eye: Hemorrhage present.  Cardiovascular:     Rate and Rhythm: Regular rhythm. Tachycardia present.  Pulmonary:     Effort: Respiratory distress present.     Breath sounds: Rhonchi present.  Abdominal:     General: Abdomen is flat.     Palpations: Abdomen  is soft.  Skin:    General: Skin is warm and dry.     Findings: Bruising and ecchymosis present.     Comments: Ecchymosis noted on the back, both arms, and chest wall  Neurological:     General: No focal deficit present.     Mental Status: She is alert.     ED Results / Procedures / Treatments   Labs (all labs ordered are listed, but only abnormal results are displayed) Labs Reviewed  CBC - Abnormal; Notable for the following components:      Result Value   WBC 3.2 (*)    RBC 2.04 (*)    Hemoglobin 6.2 (*)    HCT 19.0 (*)    RDW 16.7 (*)    Platelets 36 (*)    All other components within normal limits  APTT - Abnormal; Notable for the following components:   aPTT 23 (*)    All other components within normal limits  COMPREHENSIVE METABOLIC PANEL - Abnormal; Notable for the following components:   Sodium 133 (*)    Potassium 3.3 (*)    Chloride 97 (*)    Glucose, Bld 134 (*)    Calcium 7.5 (*)    Total Protein 5.3 (*)    Albumin 2.3 (*)    All other components within normal limits  CULTURE, BLOOD (ROUTINE X 2)  CULTURE, BLOOD (ROUTINE X 2)  PROTIME-INR  LACTIC ACID, PLASMA  TYPE AND SCREEN  PREPARE RBC (CROSSMATCH)  PREPARE PLATELET PHERESIS  TYPE AND SCREEN  PREPARE RBC (CROSSMATCH)    EKG None  Radiology DG Chest Port 1 View  Result Date: 09/27/2021 CLINICAL DATA:  Nosebleed EXAM: PORTABLE CHEST 1 VIEW COMPARISON:  09/22/2021 FINDINGS: The heart size and mediastinal contours are within normal limits. Significant interval increase in heterogeneous and consolidative airspace opacity of the left apex. The visualized skeletal structures are unremarkable. IMPRESSION: Significant interval increase in heterogeneous and consolidative airspace opacity of the left apex, concerning for infection. Consider CT to further evaluate. Electronically Signed   By: Delanna Ahmadi M.D.   On: 09/27/2021 17:57    Procedures .Epistaxis Management  Date/Time: 09/27/2021 9:10  PM  Performed by: Harriet Pho, PA-C Authorized by: Harriet Pho, PA-C   Consent:    Consent obtained:  Verbal   Consent given by:  Patient   Risks, benefits, and alternatives were discussed: yes     Risks discussed:  Bleeding   Alternatives discussed:  Alternative treatment Universal protocol:    Procedure explained and questions answered to patient or proxy's satisfaction: yes     Relevant documents present and verified: yes   Anesthesia:    Anesthesia method:  None Procedure details:  Treatment site:  L septum   Treatment method:  Nasal balloon   Treatment complexity:  Limited   Treatment episode: initial   Post-procedure details:    Assessment:  Bleeding decreased   Procedure completion:  Tolerated     Medications Ordered in ED Medications  oxymetazoline (AFRIN) 0.05 % nasal spray 1 spray (1 spray Each Nare Not Given 09/27/21 2033)  0.9 %  sodium chloride infusion (10 mL/hr Intravenous Not Given 09/27/21 2055)  0.9 %  sodium chloride infusion (Manually program via Guardrails IV Fluids) ( Intravenous Not Given 09/27/21 2056)  0.9 %  sodium chloride infusion (Manually program via Guardrails IV Fluids) (has no administration in time range)    ED Course/ Medical Decision Making/ A&P                           Medical Decision Making Patient is critically ill and will require multiple hospital resources. This patient presents to the ED for concern of epistaxis, however during encounter patient was in respiratory distress, hypoxic, hypotensive, and tachycardic. With recent history of significant blood loss, subconjunctival hemorrhage, and diffuse ecchymosis there was an immediate concern for significant blood loss with likely severe anemia and thrombocytopenia in the setting of her multiple myeloma. Labs revealed pancytopenia.  Concerning presentation prompted immediate transfer to [facility], ICU.  Ordered 2 units of blood but was unable to administer due to the fact that  patient has history of multiple blood transfusions and has developed antibodies requiring customized additives. Patient did maintain stable vitals albeit tachycardic and hypertensive. For epistaxis, administered a rhino rocket to prevent further bleeding.   Co morbidities: Discussed in HPI     EMR reviewed including pt PMHx, past surgical history and past visits to ER.   See HPI for more details   Lab Tests:   I ordered and independently interpreted labs. Labs notable for anemia and thrombocytopenia.    Imaging Studies:  Abnormal findings. I personally reviewed all imaging studies. CXR notable for significant interval increase in heterogeneous and consolidative airspace opacity of the left apex, concerning for infection.      Cardiac Monitoring:       The patient was maintained on a cardiac monitor.  I personally viewed and interpreted the cardiac monitored which showed an underlying rhythm of: sinus tachycardia   Medicines ordered:  I ordered 2 units of blood  for  significant blood loss and anemia revealed from labs. Did not administer blood product d/t h/o antibody formation because of multiple formations. Deferred until arrival to [facility] ICU.  Ordered fluids for hypotension.  Performed nasal packing for epistaxis with with improvement.   Critical Interventions:       Hypotension requiring volume bolus -  Acute blood loss and pancytopenia prompting immediate transfer to ICU    Consults/Attending Physician        Consulted critical team at [facility] in preparation for transfer to ICU.   Reevaluation:  After the interventions noted above I re-evaluated patient and found that they have : stayed the same   Problem List / ED Course:  Patient initially presented with episodic epistaxis but physical exam revealed concerns for shortness of breath, respiratory distress hypertension, tachycardia, and acute blood loss.  It was quickly decided to transfer to  ICU at [facility]. I did order 2 units of blood to be given before transfer but was informed by her daughter that she has developed antibodies in  the past due to multiple transfusions.  Deferred blood transfusion until arrival to ICU and ordered fluids.  Maintain stable vitals throughout.  Nasal packing required for epistaxis.   Dispostion:  After consideration of the diagnostic results and the patients response to treatment, I feel that the patent would benefit from immediate transfer to ICU for ongoing management for acute blood loss, hypotension, and pancytopenia.    Amount and/or Complexity of Data Reviewed Labs: ordered. Radiology: ordered.  Risk OTC drugs. Prescription drug management. Decision regarding hospitalization.           Final Clinical Impression(s) / ED Diagnoses Final diagnoses:  Epistaxis  Severe anemia  Pancytopenia [facility])    Rx / DC Orders ED Discharge Orders     None         Harriet Pho, PA-C 09/27/21 2329    Noemi Chapel, MD 10/01/21 [phone]

## 2021-09-27 NOTE — ED Notes (Signed)
Both sets of blood cultures drawn before antibiotic administration  

## 2021-09-28 ENCOUNTER — Other Ambulatory Visit: Payer: Self-pay

## 2021-09-28 DIAGNOSIS — R040 Epistaxis: Secondary | ICD-10-CM | POA: Diagnosis not present

## 2021-09-28 DIAGNOSIS — E039 Hypothyroidism, unspecified: Secondary | ICD-10-CM | POA: Diagnosis not present

## 2021-09-28 DIAGNOSIS — D62 Acute posthemorrhagic anemia: Secondary | ICD-10-CM

## 2021-09-28 DIAGNOSIS — E876 Hypokalemia: Secondary | ICD-10-CM | POA: Diagnosis not present

## 2021-09-28 LAB — BPAM PLATELET PHERESIS
Blood Product Expiration Date: 202307252359
ISSUE DATE / TIME: 202307232255
Unit Type and Rh: 7300

## 2021-09-28 LAB — CULTURE, BLOOD (ROUTINE X 2)
Culture: NO GROWTH
Culture: NO GROWTH
Special Requests: ADEQUATE
Special Requests: ADEQUATE

## 2021-09-28 LAB — BASIC METABOLIC PANEL
Anion gap: 8 (ref 5–15)
BUN: 17 mg/dL (ref 8–23)
CO2: 25 mmol/L (ref 22–32)
Calcium: 7.2 mg/dL — ABNORMAL LOW (ref 8.9–10.3)
Chloride: 106 mmol/L (ref 98–111)
Creatinine, Ser: 0.77 mg/dL (ref 0.44–1.00)
GFR, Estimated: >60 mL/min (ref >60)
Glucose, Bld: 118 mg/dL — ABNORMAL HIGH (ref 70–99)
Potassium: 4.1 mmol/L (ref 3.5–5.1)
Sodium: 139 mmol/L (ref 135–145)

## 2021-09-28 LAB — CBC
HCT: 27.2 % — ABNORMAL LOW (ref 36.0–46.0)
Hemoglobin: 9.1 g/dL — ABNORMAL LOW (ref 12.0–15.0)
MCH: 29.5 pg (ref 26.0–34.0)
MCHC: 33.5 g/dL (ref 30.0–36.0)
MCV: 88.3 fL (ref 80.0–100.0)
Platelets: 54 10*3/uL — ABNORMAL LOW (ref 150–400)
RBC: 3.08 MIL/uL — ABNORMAL LOW (ref 3.87–5.11)
RDW: 15.5 % (ref 11.5–15.5)
WBC: 3.5 10*3/uL — ABNORMAL LOW (ref 4.0–10.5)
nRBC: 0 % (ref 0.0–0.2)

## 2021-09-28 LAB — LACTIC ACID, PLASMA: Lactic Acid, Venous: 1 mmol/L (ref 0.5–1.9)

## 2021-09-28 LAB — PREPARE PLATELET PHERESIS: Unit division: 0

## 2021-09-28 LAB — MAGNESIUM: Magnesium: 2 mg/dL (ref 1.7–2.4)

## 2021-09-28 MED ORDER — SODIUM CHLORIDE 0.9 % IV SOLN
3.0000 g | Freq: Three times a day (TID) | INTRAVENOUS | Status: DC
  Administered 2021-09-28 – 2021-10-02 (×12): 3 g via INTRAVENOUS
  Filled 2021-09-28 (×15): qty 8

## 2021-09-28 NOTE — Progress Notes (Signed)
Pt experiencing nose bleeding. Unable to perform MRSA swab at this time.  Lavenia Atlas, RN

## 2021-09-28 NOTE — Consult Note (Signed)
Reason for Consult: Epistaxis Referring Physician: Jonetta Osgood, MD  Courtney Arnold is an 81 y.o. female.  HPI: History of multiple myeloma, pancytopenia, admitted yesterday here after being transferred from [facility] emergency department where she had been treated for recurrent epistaxis.  She has been transfused and is currently stable.  There has been no bleeding since the packing was placed late yesterday in [place].  Past Medical History:  Diagnosis Date   Atrial fibrillation ([place]) 2011   Postop, spontaneous conversion to normal sinus after one hour   Compression fracture 07/24/2009   T12; kyphoplasty   History of echocardiogram 07/2009   EF 65%   Hypertension    Thyroid disease    Tobacco abuse     Past Surgical History:  Procedure Laterality Date   BACK SURGERY     BACK SURGERY  06/06/2015   BREAST EXCISIONAL BIOPSY Left    50 years ago  benign   CHOLECYSTECTOMY N/A 04/25/2015   Procedure: LAPAROSCOPIC CHOLECYSTECTOMY WITH INTRAOPERATIVE CHOLANGIOGRAM;  Surgeon: Mickeal Skinner, MD;  Location: [facility] OR[facility];  Service: General;  Laterality: N/A;   COLONOSCOPY N/A 11/26/2015   Procedure: COLONOSCOPY;  Surgeon: Daneil Dolin, MD;  Location: [facility];  Service: Endoscopy;  Laterality: N/A;  7:30 am   ERCP N/A 04/14/2015   Procedure: ENDOSCOPIC RETROGRADE CHOLANGIOPANCREATOGRAPHY (ERCP) Biliary Sphincterotomy, 10x7 stent placement Dilated bilary system just not well seen;  Surgeon: Rogene Houston, MD;  Location: [facility];  Service: Endoscopy;  Laterality: N/A;   ERCP N/A 06/12/2015   Procedure: ENDOSCOPIC RETROGRADE CHOLANGIOPANCREATOGRAPHY (ERCP);  Surgeon: Rogene Houston, MD;  Location[facility];  Service: Endoscopy;  Laterality: N/A;   ESOPHAGOGASTRODUODENOSCOPY N/A 06/12/2015   Procedure: DIAGNOSTIC ESOPHAGOGASTRODUODENOSCOPY (EGD);  Surgeon: Rogene Houston, MD;  [facility];  Service: Endoscopy;  Laterality: N/A;   FEMUR IM NAIL Right 05/11/2019    Procedure: RETROGRADE INTRAMEDULLARY NAIL FEMORAL;  Surgeon: Shona Needles, MD;  Location: [place];  Service: Orthopedics;  Laterality: Right;   STENT REMOVAL  06/12/2015   Procedure: STENT REMOVAL ;  Surgeon: Rogene Houston, MD;  [facility];  Service: Endoscopy;;    Family History  Problem Relation Age of Onset   Heart attack Father    Stroke Father    Breast cancer Sister    Thyroid disease Neg Hx    Colon cancer Neg Hx     Social History:  reports that she quit smoking about 8 years ago. Her smoking use included cigarettes. She has a 3.00 pack-year smoking history. She has never used smokeless tobacco. She reports that she does not drink alcohol and does not use drugs.  Allergies: No Known Allergies  Medications: Reviewed  Results for orders placed or performed during the hospital encounter of 09/27/21 (from the past 48 hour(s))  Blood culture (routine x 2)     Status: None (Preliminary result)   Collection Time: 09/27/21  4:49 PM   Specimen: BLOOD  Result Value Ref Range   Specimen Description      BLOOD RIGHT ANTECUBITAL Performed at [facility], [place] [address], [place], [place] [address]    Special Requests      BOTTLES DRAWN AEROBIC AND ANAEROBIC Blood Culture results may not be optimal due to an excessive volume of blood received in culture bottles Performed at [place] [address], [place], [place] [address]    Culture      NO  GROWTH < 12 HOURS Performed at [facility], [address], [place], [place] [address]    Report Status PENDING   CBC     Status: Abnormal   Collection Time: 09/27/21  5:00 PM  Result Value Ref Range   WBC 3.2 (L) 4.0 - 10.5 K/uL   RBC 2.04 (L) 3.87 - 5.11 MIL/uL   Hemoglobin 6.2 (LL) 12.0 - 15.0 g/dL    Comment: REPEATED TO VERIFY THIS CRITICAL RESULT HAS VERIFIED AND BEEN CALLED TO EANES,M BY JAMIE WOODIE ON 07 23 2023 AT 1741, AND HAS BEEN READ BACK.     HCT 19.0 (L) 36.0 - 46.0 %   MCV 93.1 80.0 -  100.0 fL   MCH 30.4 26.0 - 34.0 pg   MCHC 32.6 30.0 - 36.0 g/dL   RDW 16.7 (H) 11.5 - 15.5 %   Platelets 36 (L) 150 - 400 K/uL    Comment: SPECIMEN CHECKED FOR CLOTS REPEATED TO VERIFY    nRBC 0.0 0.0 - 0.2 %    Comment: Performed at [facility], [address] [place], [place] [address]  APTT     Status: Abnormal   Collection Time: 09/27/21  5:00 PM  Result Value Ref Range   aPTT 23 (L) 24 - 36 seconds    Comment: Performed at [facility], [address], [place], [place] [address]  Protime-INR     Status: None   Collection Time: 09/27/21  5:00 PM  Result Value Ref Range   Prothrombin Time 13.7 11.4 - 15.2 seconds   INR 1.1 0.8 - 1.2    Comment: (NOTE) INR goal varies based on device and disease states. Performed at [facility], [address], [place], [place] [address]   Lactic acid, plasma     Status: None   Collection Time: 09/27/21  5:00 PM  Result Value Ref Range   Lactic Acid, Venous 1.3 0.5 - 1.9 mmol/L    Comment: Performed at [facility], [address] [place], [place] [address]  Comprehensive metabolic panel     Status: Abnormal   Collection Time: 09/27/21  5:00 PM  Result Value Ref Range   Sodium 133 (L) 135 - 145 mmol/L   Potassium 3.3 (L) 3.5 - 5.1 mmol/L   Chloride 97 (L) 98 - 111 mmol/L   CO2 28 22 - 32 mmol/L   Glucose, Bld 134 (H) 70 - 99 mg/dL    Comment: Glucose reference range applies only to samples taken after fasting for at least 8 hours.   BUN 23 8 - 23 mg/dL   Creatinine, Ser 0.94 0.44 - 1.00 mg/dL   Calcium 7.5 (L) 8.9 - 10.3 mg/dL   Total Protein 5.3 (L) 6.5 - 8.1 g/dL   Albumin 2.3 (L) 3.5 - 5.0 g/dL   AST 16 15 - 41 U/L   ALT 15 0 - 44 U/L   Alkaline Phosphatase 100 38 - 126 U/L   Total Bilirubin 0.9 0.3 - 1.2 mg/dL   GFR, Estimated >60 >60 mL/min    Comment: (NOTE) Calculated using the CKD-EPI Creatinine Equation (2021)    Anion gap 8 5 - 15    Comment: Performed at [facility], [address] [place],  [address]  Blood  culture (routine x 2)     Status: None (Preliminary result)   Collection Time: 09/27/21  5:08 PM   Specimen: BLOOD  Result Value Ref Range   Specimen Description      BLOOD LEFT ANTECUBITAL Performed  at [facility], [place] [address], [place], [place] [address]    Special Requests      BOTTLES DRAWN AEROBIC AND ANAEROBIC Blood Culture results may not be optimal due to an excessive volume of blood received in culture bottles Performed at [place] [address], [place], [place] [address]    Culture      NO GROWTH < 12 HOURS Performed at [facility], [address] [place], [place] [address]    Report Status PENDING   Prepare RBC (crossmatch)     Status: None   Collection Time: 09/27/21  7:38 PM  Result Value Ref Range   Order Confirmation      ORDER PROCESSED BY BLOOD BANK Performed at [facility], [place] [address] [place], [place] [address]   Type and screen     Status: None (Preliminary result)   Collection Time: 09/27/21  8:50 PM  Result Value Ref Range   ABO/RH(D) O POS    Antibody Screen POS    Sample Expiration 09/30/2021,2359    Antibody Identification NON SPECIFIC ANTIBODY REACTIVITY    Unit Number [id number]    Blood Component Type RBC, LR IRR    Unit division 00    Status of Unit ISSUED    Transfusion Status OK TO TRANSFUSE    Crossmatch Result COMPATIBLE    Unit Number [id number]    Blood Component Type RBC, LR IRR    Unit division 00    Status of Unit ISSUED    Transfusion Status OK TO TRANSFUSE    Crossmatch Result COMPATIBLE   Prepare platelet pheresis     Status: None   Collection Time: 09/27/21  9:10 PM  Result Value Ref Range   Unit Number [id number]    Blood Component Type PLTP2 PSORALEN TREATED    Unit division 00    Status of Unit ISSUED,FINAL    Transfusion Status      OK TO TRANSFUSE Performed at [facility], [place] [address] [place], [place] [address]   SARS Coronavirus 2 by RT PCR (hospital order,  performed in [facility] hospital lab) *cepheid single result test* Throat     Status: None   Collection Time: 09/27/21 10:16 PM   Specimen: Throat; Nasal Swab  Result Value Ref Range   SARS Coronavirus 2 by RT PCR NEGATIVE NEGATIVE    Comment: (NOTE) SARS-CoV-2 target nucleic acids are NOT DETECTED.  The SARS-CoV-2 RNA is generally detectable in upper and lower respiratory specimens during the acute phase of infection. The lowest concentration of SARS-CoV-2 viral copies this assay can detect is 250 copies / mL. A negative result does not preclude SARS-CoV-2 infection and should not be used as the sole basis for treatment or other patient management decisions.  A negative result may occur with improper specimen collection / handling, submission of specimen other than nasopharyngeal swab, presence of viral mutation(s) within the areas targeted by this assay, and inadequate number of viral copies (<250 copies / mL). A negative result must be combined with clinical observations, patient history, and epidemiological information.  Fact Sheet for Patients:   https://www.patel.info/  Fact Sheet for Healthcare Providers: https://hall.com/  This test is not yet approved or  cleared by the [place] FDA and has been authorized for detection and/or diagnosis of SARS-CoV-2 by FDA under an Emergency Use Authorization (EUA).  This EUA will remain in effect (meaning this test can be used) for the duration of the  COVID-19 declaration under Section 564(b)(1) of the Act, 21 U.S.C. section 360bbb-3(b)(1), unless the authorization is terminated or revoked sooner.  Performed at [facility], [place] [address] [place],  [address]     CT CHEST WO CONTRAST  Result Date: 09/27/2021 CLINICAL DATA:  Shortness of breath, tachypnea, recent pneumonia EXAM: CT CHEST WITHOUT CONTRAST TECHNIQUE: Multidetector CT imaging of the chest was performed  following the standard protocol without IV contrast. RADIATION DOSE REDUCTION: This exam was performed according to the departmental dose-optimization program which includes automated exposure control, adjustment of the mA and/or kV according to patient size and/or use of iterative reconstruction technique. COMPARISON:  Chest radiograph dated 09/27/2021 and 09/22/2021. FINDINGS: Cardiovascular: The heart is normal in size. No pericardial effusion. No evidence of thoracic aortic aneurysm. Atherosclerotic calcifications of the aortic arch. Three vessel coronary atherosclerosis. Mediastinum/Nodes: Small mediastinal lymph nodes as a 90 pathologic CT size criteria. Visualized thyroid is unremarkable. Lungs/Pleura: Consolidative patchy opacity in the left lung apex/left upper lobe with associated air bronchograms in the medial left upper lobe/lingula. Additional mild patchy opacity in the posterior right upper lobe. When coupled with the rapid progression on chest radiographs, this appearance is compatible with multifocal pneumonia. Small left pleural effusion. No pneumothorax. Upper Abdomen: Visualized upper abdomen is notable for mild vascular calcifications and a 3.2 cm anterior left upper pole renal cyst. Musculoskeletal: Multiple lytic lesions, including in the medial clavicles, sternum, and multiple thoracic vertebral bodies, compatible with the patient's known multiple myeloma. Prior vertebral augmentation at T5, T6, and T11. Severe compression fracture deformity at T9. IMPRESSION: Multifocal upper lobe pneumonia, left greater than right. Associated small left pleural effusion. Consider follow-up chest radiographs in 4-6 weeks to document clearance. Multiple lytic lesions, compatible with the patient's known multiple myeloma. Aortic Atherosclerosis (ICD10-I70.0). Electronically Signed   By: Julian Hy M.D.   On: 09/27/2021 23:34   DG Chest Port 1 View  Result Date: 09/27/2021 CLINICAL DATA:  Nosebleed  EXAM: PORTABLE CHEST 1 VIEW COMPARISON:  09/22/2021 FINDINGS: The heart size and mediastinal contours are within normal limits. Significant interval increase in heterogeneous and consolidative airspace opacity of the left apex. The visualized skeletal structures are unremarkable. IMPRESSION: Significant interval increase in heterogeneous and consolidative airspace opacity of the left apex, concerning for infection. Consider CT to further evaluate. Electronically Signed   By: Delanna Ahmadi M.D.   On: 09/27/2021 17:57    TGY:BWLSLHTD except as listed in admit H&P  Blood pressure 129/68, pulse 92, temperature (!) 97.5 F (36.4 C), temperature source Oral, resp. rate (!) 27, height _0  (1.575 m), weight 60.8 kg, SpO2 97 %.  PHYSICAL EXAM: Overall appearance: Elderly lady, in no distress Head:  Normocephalic, atraumatic. Ears: External ears look healthy. Nose: External nose is healthy in appearance.  Inflatable packing in the left nasal cavity, no active or recent bleeding. Oral Cavity/Pharynx:  There are no mucosal lesions or masses identified. Larynx/Hypopharynx: Deferred Neuro:  No identifiable neurologic deficits. Neck: No palpable neck masses.  Studies Reviewed: none  Procedures: none   Assessment/Plan: Epistaxis secondary to pancytopenia.  Fluid and blood product resuscitation ongoing.  Medical care ongoing.  No bleeding since arrival here last night.  Leave packing in place for 5 days.  We will plan to remove it either in the office or here in the hospital on Friday.  If any further bleeding will manage as needed.  R04.0    Medical Decision Making: #/Complex Problems: 2  Data Reviewed:2  Management:2 (  1-Straightforward, 2-Low, 3-Moderate, 4-High)   Courtney Arnold 09/28/2021, 8:45 AM

## 2021-09-28 NOTE — ED Notes (Signed)
Pt receiving blood

## 2021-09-28 NOTE — ED Provider Notes (Signed)
Physical Exam  BP 121/61   Pulse 92   Temp 98.4 F (36.9 C) (Oral)   Resp (!) 28   Ht 5' 2"  (1.575 m)   Wt 60.8 kg   SpO2 95%   BMI 24.52 kg/m   Physical Exam Constitutional:      General: She is not in acute distress.    Appearance: She is ill-appearing.     Comments: Pale in appearance  HENT:     Head: Atraumatic.     Right Ear: External ear normal.     Left Ear: External ear normal.     Nose:     Comments: Occlusion device present in the left nare.  No ongoing bleeding.    Mouth/Throat:     Mouth: Mucous membranes are moist.     Pharynx: Oropharynx is clear.     Comments: No obvious blood identified in the posterior oropharynx. Eyes:     Extraocular Movements: Extraocular movements intact.     Pupils: Pupils are equal, round, and reactive to light.  Cardiovascular:     Rate and Rhythm: Tachycardia present.     Pulses: Normal pulses.     Heart sounds: Normal heart sounds.  Pulmonary:     Effort: Pulmonary effort is normal.     Breath sounds: No wheezing, rhonchi or rales.  Abdominal:     Tenderness: There is abdominal tenderness (Mild epigastric tenderness). There is no guarding or rebound.  Musculoskeletal:     Right lower leg: No edema.     Left lower leg: No edema.  Skin:    Capillary Refill: Capillary refill takes less than 2 seconds.  Neurological:     General: No focal deficit present.     Mental Status: She is oriented to person, place, and time. Mental status is at baseline.     Procedures  Procedures  ED Course / MDM    Medical Decision Making Problems Addressed: Epistaxis: complicated acute illness or injury with systemic symptoms that poses a threat to life or bodily functions Pancytopenia ([place]): complicated acute illness or injury with systemic symptoms that poses a threat to life or bodily functions Severe anemia: complicated acute illness or injury with systemic symptoms that poses a threat to life or bodily functions  Amount and/or  Complexity of Data Reviewed Labs: ordered. Radiology: ordered.  Risk Decision regarding hospitalization.   Patient is an 81 year old female with a history of multiple myeloma who presents emergency department as a transfer from [facility] for admission in the setting of epistaxis.  Patient had an admission earlier this week in the setting of ongoing nosebleed and had received blood transfusions and platelet transfusions.  She was discharged this past Thursday, however today presented again to the emergency department in the setting of ongoing bleeding from her nose.  Patient had evidence at [facility] emergency department of blood in her posterior oropharynx raising suspicion for a posterior nosebleed.  Posterior nasal packing was provided with an inflated cuff.  This did temporarily tamponade the bleeding.  Her initial hemoglobin at outside hospital was 6.2 with platelets of 36.  Blood products have been ordered however they have not been administered prior to her being transferred.  On arrival patient is hemodynamically stable.  She is not having any ongoing bleeding from her nose.  ENT was aware of the patient and felt it would be appropriate for hospital admission.  Oncology was consulted in the setting of her underlying multiple myeloma and went to transfuse.  They recommended transfusion at any level in the setting of ongoing bleeding, however only transfuse the platelets if they are under 10,000, but if the patient has a fever then transfuse the platelets under 20,000.  They also recommended a goal hemoglobin of 8.  Thus 2 units of packed red blood cells were ordered for this patient.  Given her previous notes regarding possible antibodies to certain components of the blood products irradiated red blood cells were ordered.  In the absence of any ongoing bleeding no platelet transfusions were ordered.  Hospitalist team was contacted for admission for the patient.  Please see inpatient provider notes  for additional details regarding this patient's ongoing hospital course and management.       Nelta Numbers, MD 09/28/21 0044    Drenda Freeze, MD 10/06/21 1010

## 2021-09-28 NOTE — Progress Notes (Signed)
Pharmacy Antibiotic Note  Courtney Arnold is a 81 y.o. female admitted on 09/27/2021 presenting from [facility] with nose bleed. Patient has history of multiple myeloma and concern for pneumonia.  Pharmacy has been consulted for Unasyn dosing.   Plan: Unasyn 3 gm every 8 hours Monitor renal function  Height: 5' 2"  (157.5 cm) Weight: 60.8 kg (134 lb 0.6 oz) IBW/kg (Calculated) : 50.1  Temp (24hrs), Avg:98.3 F (36.8 C), Min:97.5 F (36.4 C), Max:98.8 F (37.1 C)  Recent Labs  Lab 09/22/21 0942 09/23/21 0631 09/24/21 0556 09/27/21 1700  WBC 2.6* 1.9* 1.8* 3.2*  CREATININE 1.65* 1.45* 0.79 0.94  LATICACIDVEN  --   --   --  1.3    Estimated Creatinine Clearance: 41 mL/min (by C-G formula based on SCr of 0.94 mg/dL).    No Known Allergies  Antimicrobials this admission: Unasyn 7/24 >>  Azithromycin 7/24 x1 Ceftriaxone 7/23 x1 Vancomycin 7/24 x1  Microbiology results: 7/23 BCx: no growth 12 hours  Thank you for allowing pharmacy to be a part of this patient's care.  Jeneen Rinks 5/95/3967 28:97 AM

## 2021-09-28 NOTE — ED Notes (Signed)
Admitting paged about the 2 extra units of blood in the [facility] for the pt

## 2021-09-28 NOTE — Progress Notes (Signed)
PROGRESS NOTE        PATIENT DETAILS Name: Courtney Arnold Age: 81 y.o. Sex: female Date of Birth: 07/09/1940 Admit Date: 09/27/2021 Admitting Physician Shela Leff, MD RFV:[id number], [name], MD  Brief Summary: Patient is a 81 y.o.  female with history of multiple myeloma-pancytopenia-who presented with severe epistaxis and acute blood loss anemia.  See below for further details.   Significant events: 7/23>> transferred to [facility] from [facility] for ENT evaluation-severe epistaxis.  Significant studies: 7/23>> CT chest: Multifocal upper lobe PNA-left> right.  Small left pleural effusion.  Multiple lytic lesions.  Significant microbiology data: 7/23>> blood culture: No growth 7/23>> COVID PCR: Negative  Procedures: None  Consults: None  Subjective: Lying comfortably in bed-denies any chest pain or shortness of breath.  No further epistaxis.  Objective: Vitals: Blood pressure 129/68, pulse 92, temperature (!) 97.5 F (36.4 C), temperature source Oral, resp. rate (!) 27, height 5' 2"  (1.575 m), weight 60.8 kg, SpO2 97 %.   Exam: Gen Exam:Alert awake-not in any distress.  Looks frail/weak. HEENT:atraumatic, normocephalic Chest: B/L clear to auscultation anteriorly CVS:S1S2 regular Abdomen:soft non tender, non distended Extremities:no edema Neurology: Non focal Skin: no rash  Pertinent Labs/Radiology:    Latest Ref Rng & Units 09/27/2021    5:00 PM 09/24/2021    5:56 AM 09/23/2021    6:31 AM  CBC  WBC 4.0 - 10.5 K/uL 3.2  1.8  1.9   Hemoglobin 12.0 - 15.0 g/dL 6.2  7.5  8.5   Hematocrit 36.0 - 46.0 % 19.0  23.9  25.1   Platelets 150 - 400 K/uL 36  28  30     Lab Results  Component Value Date   NA 133 (L) 09/27/2021   K 3.3 (L) 09/27/2021   CL 97 (L) 09/27/2021   CO2 28 09/27/2021      Assessment/Plan: Epistaxis: In the setting of thrombocytopenia-packing in place-ENT following.  Thankfully epistaxis seems to have resolved.  Continue to  follow closely.  Pancytopenia-worsening acute blood loss anemia due to epistaxis-worsening thrombocytopenia due to multiple myeloma/chemotherapy-history of relapsed IgA multiple myeloma: Difficult situation-has had worsening pancytopenia-due to underlying multiple myeloma/with chemotherapy-suspect that if she continues to have severe thrombocytopenia-we will put her at risk for further bleeding episodes.  We will touch base with oncology to see if other therapies are required.  In the meantime-continue to follow CBC-getting second unit of PRBC this morning-received for his platelets yesterday.  Aspiration pneumonia: With history of ongoing epistaxis-1 episode of vomiting highly suspicion that she aspirated.  Sepsis physiology not present-sepsis ruled out.  Stop vancomycin/Rocephin/Zithromax-switch to Unasyn.  Hypothyroidism: Continue Synthroid  PAF: Not a candidate for anticoagulation-maintaining sinus rhythm.  Monitor on telemetry.  Debility/deconditioning: PT/OT eval ordered.  BMI: Estimated body mass index is 24.52 kg/m as calculated from the following:   Height as of this encounter: 5' 2"  (1.575 m).   Weight as of this encounter: 60.8 kg.   Code status:   Code Status: DNR (reconfirmed with patient and daughter at bedside on 7/24)  DVT Prophylaxis: SCDs Start: 09/27/21 2120   Family Communication: Daughter at bedside.   Disposition Plan: Status is: Inpatient Remains inpatient appropriate because: Severe pancytopenia-epistaxis-aspiration pneumonia-not stable for discharge.   Planned Discharge Destination:Home health  Diet: Diet Order             Diet Heart Room service  appropriate? Yes; Fluid consistency: Thin  Diet effective now                     Antimicrobial agents: Anti-infectives (From admission, onward)    Start     Dose/Rate Route Frequency Ordered Stop   09/28/21 2200  vancomycin (VANCOREADY) IVPB 750 mg/150 mL        750 mg 150 mL/hr over 60 Minutes  Intravenous Every 24 hours 09/27/21 2153     09/27/21 2230  azithromycin (ZITHROMAX) 500 mg in sodium chloride 0.9 % 250 mL IVPB        500 mg 250 mL/hr over 60 Minutes Intravenous Every 24 hours 09/27/21 2158     09/27/21 2200  acyclovir (ZOVIRAX) tablet 400 mg        400 mg Oral 2 times daily 09/27/21 2126     09/27/21 2200  vancomycin (VANCOREADY) IVPB 1250 mg/250 mL        1,250 mg 166.7 mL/hr over 90 Minutes Intravenous  Once 09/27/21 2153 09/28/21 0256   09/27/21 2200  piperacillin-tazobactam (ZOSYN) IVPB 3.375 g  Status:  Discontinued        3.375 g 12.5 mL/hr over 240 Minutes Intravenous Every 8 hours 09/27/21 2153 09/27/21 2158   09/27/21 2200  cefTRIAXone (ROCEPHIN) 2 g in sodium chloride 0.9 % 100 mL IVPB        2 g 200 mL/hr over 30 Minutes Intravenous Every 24 hours 09/27/21 2158     09/27/21 2145  Ampicillin-Sulbactam (UNASYN) 3 g in sodium chloride 0.9 % 100 mL IVPB  Status:  Discontinued        3 g 200 mL/hr over 30 Minutes Intravenous Every 8 hours 09/27/21 2136 09/27/21 2145        MEDICATIONS: Scheduled Meds:  sodium chloride   Intravenous Once   acyclovir  400 mg Oral BID   levothyroxine  137 mcg Oral QAC breakfast   oxymetazoline  1 spray Each Nare Once   Continuous Infusions:  sodium chloride     sodium chloride Stopped (09/28/21 0736)   azithromycin Stopped (09/28/21 0201)   cefTRIAXone (ROCEPHIN)  IV Stopped (09/28/21 0030)   vancomycin     PRN Meds:.acetaminophen **OR** acetaminophen, ondansetron (ZOFRAN) IV   I have personally reviewed following labs and imaging studies  LABORATORY DATA: CBC: Recent Labs  Lab 09/22/21 0942 09/23/21 0631 09/24/21 0556 09/27/21 1700  WBC 2.6* 1.9* 1.8* 3.2*  NEUTROABS 1.7  --   --   --   HGB 5.0* 8.5* 7.5* 6.2*  HCT 15.3* 25.1* 23.9* 19.0*  MCV 94.4 89.6 95.6 93.1  PLT 42* 30* 28* 36*    Basic Metabolic Panel: Recent Labs  Lab 09/22/21 0942 09/23/21 0631 09/24/21 0556 09/27/21 1700  NA 130* 133*  137 133*  K 3.9 3.6 4.1 3.3*  CL 91* 97* 103 97*  CO2 28 27 27 28   GLUCOSE 124* 110* 102* 134*  BUN 26* 29* 15 23  CREATININE 1.65* 1.45* 0.79 0.94  CALCIUM 9.2 8.5* 7.7* 7.5*  MG 2.0 2.1 1.9  --     GFR: Estimated Creatinine Clearance: 41 mL/min (by C-G formula based on SCr of 0.94 mg/dL).  Liver Function Tests: Recent Labs  Lab 09/22/21 0942 09/27/21 1700  AST 16 16  ALT 16 15  ALKPHOS 76 100  BILITOT 0.8 0.9  PROT 6.4* 5.3*  ALBUMIN 3.2* 2.3*   No results for input(s): "LIPASE", "AMYLASE" in the last 168 hours. No results for  input(s): "AMMONIA" in the last 168 hours.  Coagulation Profile: Recent Labs  Lab 09/27/21 1700  INR 1.1    Cardiac Enzymes: No results for input(s): "CKTOTAL", "CKMB", "CKMBINDEX", "TROPONINI" in the last 168 hours.  BNP (last 3 results) No results for input(s): "PROBNP" in the last 8760 hours.  Lipid Profile: No results for input(s): "CHOL", "HDL", "LDLCALC", "TRIG", "CHOLHDL", "LDLDIRECT" in the last 72 hours.  Thyroid Function Tests: No results for input(s): "TSH", "T4TOTAL", "FREET4", "T3FREE", "THYROIDAB" in the last 72 hours.  Anemia Panel: No results for input(s): "VITAMINB12", "FOLATE", "FERRITIN", "TIBC", "IRON", "RETICCTPCT" in the last 72 hours.  Urine analysis:    Component Value Date/Time   COLORURINE YELLOW 05/11/2019 [place] 05/11/2019 0819   LABSPEC 1.021 05/11/2019 0819   PHURINE 5.0 05/11/2019 0819   GLUCOSEU NEGATIVE 05/11/2019 0819   HGBUR MODERATE (A) 05/11/2019 0819   BILIRUBINUR NEGATIVE 05/11/2019 0819   KETONESUR NEGATIVE 05/11/2019 0819   PROTEINUR NEGATIVE 05/11/2019 0819   UROBILINOGEN 0.2 10/02/2008 0830   NITRITE NEGATIVE 05/11/2019 0819   LEUKOCYTESUR NEGATIVE 05/11/2019 0819    Sepsis Labs: Lactic Acid, Venous    Component Value Date/Time   LATICACIDVEN 1.3 09/27/2021 1700    MICROBIOLOGY: Recent Results (from the past 240 hour(s))  Culture, blood (Routine X 2) w  Reflex to ID Panel     Status: None   Collection Time: 09/23/21  6:30 AM   Specimen: BLOOD  Result Value Ref Range Status   Specimen Description   Final    BLOOD RIGHT ANTECUBITAL Performed at [facility], [place] [address], [place], [place] [address]    Special Requests   Final    BOTTLES DRAWN AEROBIC AND ANAEROBIC Blood Culture adequate volume Performed at [facility], [place] [address], [place], [place] [address]    Culture   Final    NO GROWTH 5 DAYS Performed at [facility], [address], [place], [place] [address]    Report Status 09/28/2021 FINAL  Final  Culture, blood (Routine X 2) w Reflex to ID Panel     Status: None   Collection Time: 09/23/21 11:04 AM   Specimen: BLOOD  Result Value Ref Range Status   Specimen Description BLOOD RIGHT ANTECUBITAL  Final   Special Requests   Final    BOTTLES DRAWN AEROBIC AND ANAEROBIC Blood Culture adequate volume   Culture   Final    NO GROWTH 5 DAYS Performed at [facility], [address] [place], [place] [address]    Report Status 09/28/2021 FINAL  Final  Blood culture (routine x 2)     Status: None (Preliminary result)   Collection Time: 09/27/21  4:49 PM   Specimen: BLOOD  Result Value Ref Range Status   Specimen Description   Final    BLOOD RIGHT ANTECUBITAL Performed at [facility], [place] [address] [place], [place] [address]    Special Requests   Final    BOTTLES DRAWN AEROBIC AND ANAEROBIC Blood Culture results may not be optimal due to an excessive volume of blood received in culture bottles Performed at [place] [address], [place], [place] [address]    Culture   Final    NO GROWTH < 12 HOURS Performed at [facility], [address], [place],  [address]    Report Status PENDING  Incomplete  Blood culture (routine x 2)     Status: None (Preliminary result)  Collection Time: 09/27/21  5:08 PM   Specimen: BLOOD  Result Value Ref Range  Status   Specimen Description   Final    BLOOD LEFT ANTECUBITAL Performed at [facility], [place] [address] [place], [place] [address]    Special Requests   Final    BOTTLES DRAWN AEROBIC AND ANAEROBIC Blood Culture results may not be optimal due to an excessive volume of blood received in culture bottles Performed at [place] [address], [place], [place] [address]    Culture   Final    NO GROWTH < 12 HOURS Performed at [facility], [address], [place], [place] [address]    Report Status PENDING  Incomplete  SARS Coronavirus 2 by RT PCR (hospital order, performed in [facility] hospital lab) *cepheid single result test* Throat     Status: None   Collection Time: 09/27/21 10:16 PM   Specimen: Throat; Nasal Swab  Result Value Ref Range Status   SARS Coronavirus 2 by RT PCR NEGATIVE NEGATIVE Final    Comment: (NOTE) SARS-CoV-2 target nucleic acids are NOT DETECTED.  The SARS-CoV-2 RNA is generally detectable in upper and lower respiratory specimens during the acute phase of infection. The lowest concentration of SARS-CoV-2 viral copies this assay can detect is 250 copies / mL. A negative result does not preclude SARS-CoV-2 infection and should not be used as the sole basis for treatment or other patient management decisions.  A negative result may occur with improper specimen collection / handling, submission of specimen other than nasopharyngeal swab, presence of viral mutation(s) within the areas targeted by this assay, and inadequate number of viral copies (<250 copies / mL). A negative result must be combined with clinical observations, patient history, and epidemiological information.  Fact Sheet for Patients:   https://www.patel.info/  Fact Sheet for Healthcare Providers: https://hall.com/  This test is not yet approved or  cleared by the [place] FDA and has been authorized for detection and/or  diagnosis of SARS-CoV-2 by FDA under an Emergency Use Authorization (EUA).  This EUA will remain in effect (meaning this test can be used) for the duration of the COVID-19 declaration under Section 564(b)(1) of the Act, 21 U.S.C. section 360bbb-3(b)(1), unless the authorization is terminated or revoked sooner.  Performed at [facility], [place] [address] [place], [place] [address]     RADIOLOGY STUDIES/RESULTS: CT CHEST WO CONTRAST  Result Date: 09/27/2021 CLINICAL DATA:  Shortness of breath, tachypnea, recent pneumonia EXAM: CT CHEST WITHOUT CONTRAST TECHNIQUE: Multidetector CT imaging of the chest was performed following the standard protocol without IV contrast. RADIATION DOSE REDUCTION: This exam was performed according to the departmental dose-optimization program which includes automated exposure control, adjustment of the mA and/or kV according to patient size and/or use of iterative reconstruction technique. COMPARISON:  Chest radiograph dated 09/27/2021 and 09/22/2021. FINDINGS: Cardiovascular: The heart is normal in size. No pericardial effusion. No evidence of thoracic aortic aneurysm. Atherosclerotic calcifications of the aortic arch. Three vessel coronary atherosclerosis. Mediastinum/Nodes: Small mediastinal lymph nodes as a 90 pathologic CT size criteria. Visualized thyroid is unremarkable. Lungs/Pleura: Consolidative patchy opacity in the left lung apex/left upper lobe with associated air bronchograms in the medial left upper lobe/lingula. Additional mild patchy opacity in the posterior right upper lobe. When coupled with the rapid progression on chest radiographs, this appearance is compatible with multifocal pneumonia. Small left pleural effusion. No pneumothorax. Upper Abdomen: Visualized upper abdomen is notable for mild vascular calcifications and  a 3.2 cm anterior left upper pole renal cyst. Musculoskeletal: Multiple lytic lesions, including in the medial clavicles, sternum,  and multiple thoracic vertebral bodies, compatible with the patient's known multiple myeloma. Prior vertebral augmentation at T5, T6, and T11. Severe compression fracture deformity at T9. IMPRESSION: Multifocal upper lobe pneumonia, left greater than right. Associated small left pleural effusion. Consider follow-up chest radiographs in 4-6 weeks to document clearance. Multiple lytic lesions, compatible with the patient's known multiple myeloma. Aortic Atherosclerosis (ICD10-I70.0). Electronically Signed   By: Julian Hy M.D.   On: 09/27/2021 23:34   DG Chest Port 1 View  Result Date: 09/27/2021 CLINICAL DATA:  Nosebleed EXAM: PORTABLE CHEST 1 VIEW COMPARISON:  09/22/2021 FINDINGS: The heart size and mediastinal contours are within normal limits. Significant interval increase in heterogeneous and consolidative airspace opacity of the left apex. The visualized skeletal structures are unremarkable. IMPRESSION: Significant interval increase in heterogeneous and consolidative airspace opacity of the left apex, concerning for infection. Consider CT to further evaluate. Electronically Signed   By: Delanna Ahmadi M.D.   On: 09/27/2021 17:57     LOS: 1 day   Oren Binet, MD  [facility]    To contact the attending provider between 7A-7P or the covering provider during after hours 7P-7A, please log into the web site [email or web address] and access using universal [place] password for that web site. If you do not have the password, please call the hospital operator.  09/28/2021, 9:04 AM

## 2021-09-28 NOTE — Progress Notes (Addendum)
OT Cancellation Note  Patient Details Name: Courtney Arnold MRN: [id number] DOB: 21-Jul-1940   Cancelled Treatment:    Reason Eval/Treat Not Completed: Other (comment) (Pt getting IV with IV team. OT to follow-up later today for OT eval as schedule allows.)  Update as of 1130am: Pt refused OT at this time stating "I do not need therapy at this time." Daughter in room to confirm that pt sometimes just wants to rest.  Jefferey Pica, OTR/L Acute Rehabilitation Services Office: [phone]   Codie Hainer C 09/28/2021, 11:12 AM

## 2021-09-28 NOTE — Progress Notes (Signed)
Mobility Specialist: Progress Note   09/28/21 1216  Mobility  Activity Transferred from bed to chair  Level of Assistance Minimal assist, patient does 75% or more  Assistive Device Other (Comment) (HHA)  Distance Ambulated (ft) 2 ft  Activity Response Tolerated well  $Mobility charge 1 Mobility   Pre-Mobility: 85 HR, 119/76 (88) BP, 96% SpO2 Post-Mobility: 89 HR, 96% SpO2  Pt received in the bed and assist to the chair per request. Encouraged ambulation but pt declining. Pt to the chair with call bell at her side. Chair alarm is on.   [facility] Courtney Arnold Mobility Specialist Mobility Specialist [address]: [phone]

## 2021-09-28 NOTE — Progress Notes (Signed)
PT Cancellation Note  Patient Details Name: Courtney Arnold MRN: [id number] DOB: 1940-12-16   Cancelled Treatment:    Reason Eval/Treat Not Completed: Fatigue/lethargy limiting ability to participate - will check back tomorrow.   Stacie Glaze, PT DPT Acute Rehabilitation Services Pager [phone]  Office [phone]    Louis Matte 09/28/2021, 1:41 PM

## 2021-09-29 ENCOUNTER — Inpatient Hospital Stay (HOSPITAL_COMMUNITY): Payer: Medicare Other

## 2021-09-29 ENCOUNTER — Ambulatory Visit (HOSPITAL_COMMUNITY): Payer: Medicare Other

## 2021-09-29 ENCOUNTER — Ambulatory Visit (HOSPITAL_COMMUNITY): Payer: Medicare Other | Admitting: Hematology

## 2021-09-29 DIAGNOSIS — R040 Epistaxis: Secondary | ICD-10-CM | POA: Diagnosis not present

## 2021-09-29 DIAGNOSIS — D62 Acute posthemorrhagic anemia: Secondary | ICD-10-CM | POA: Diagnosis not present

## 2021-09-29 DIAGNOSIS — E876 Hypokalemia: Secondary | ICD-10-CM | POA: Diagnosis not present

## 2021-09-29 DIAGNOSIS — E039 Hypothyroidism, unspecified: Secondary | ICD-10-CM | POA: Diagnosis not present

## 2021-09-29 LAB — COMPREHENSIVE METABOLIC PANEL
ALT: 18 U/L (ref 0–44)
AST: 20 U/L (ref 15–41)
Albumin: 2 g/dL — ABNORMAL LOW (ref 3.5–5.0)
Alkaline Phosphatase: 82 U/L (ref 38–126)
Anion gap: 9 (ref 5–15)
BUN: 14 mg/dL (ref 8–23)
CO2: 23 mmol/L (ref 22–32)
Calcium: 6.8 mg/dL — ABNORMAL LOW (ref 8.9–10.3)
Chloride: 105 mmol/L (ref 98–111)
Creatinine, Ser: 0.81 mg/dL (ref 0.44–1.00)
GFR, Estimated: >60 mL/min (ref >60)
Glucose, Bld: 93 mg/dL (ref 70–99)
Potassium: 3.6 mmol/L (ref 3.5–5.1)
Sodium: 137 mmol/L (ref 135–145)
Total Bilirubin: 0.5 mg/dL (ref 0.3–1.2)
Total Protein: 5 g/dL — ABNORMAL LOW (ref 6.5–8.1)

## 2021-09-29 LAB — BPAM RBC
Blood Product Expiration Date: 202308152359
Blood Product Expiration Date: 202308202359
ISSUE DATE / TIME: 202307240146
ISSUE DATE / TIME: 202307240600
Unit Type and Rh: 5100
Unit Type and Rh: 5100

## 2021-09-29 LAB — TYPE AND SCREEN
ABO/RH(D): O POS
Antibody Screen: POSITIVE
Donor AG Type: NEGATIVE
Donor AG Type: NEGATIVE
Unit division: 0
Unit division: 0

## 2021-09-29 LAB — CBC
HCT: 24.6 % — ABNORMAL LOW (ref 36.0–46.0)
Hemoglobin: 8.1 g/dL — ABNORMAL LOW (ref 12.0–15.0)
MCH: 29.5 pg (ref 26.0–34.0)
MCHC: 32.9 g/dL (ref 30.0–36.0)
MCV: 89.5 fL (ref 80.0–100.0)
Platelets: 41 10*3/uL — ABNORMAL LOW (ref 150–400)
RBC: 2.75 MIL/uL — ABNORMAL LOW (ref 3.87–5.11)
RDW: 15.7 % — ABNORMAL HIGH (ref 11.5–15.5)
WBC: 2.6 10*3/uL — ABNORMAL LOW (ref 4.0–10.5)
nRBC: 0 % (ref 0.0–0.2)

## 2021-09-29 LAB — MAGNESIUM: Magnesium: 2 mg/dL (ref 1.7–2.4)

## 2021-09-29 MED ORDER — OXYMETAZOLINE HCL 0.05 % NA SOLN
3.0000 | Freq: Two times a day (BID) | NASAL | Status: DC | PRN
Start: 2021-09-29 — End: 2021-10-02
  Administered 2021-09-29: 3 via NASAL

## 2021-09-29 NOTE — Evaluation (Signed)
Physical Therapy Evaluation Patient Details Name: Courtney Arnold MRN: [id number] DOB: [birth year] Today's Date: 09/29/2021  History of Present Illness  Pt is an 81 y.o. female transfer from [facility] to [facility] 09/27/21 with epistaxis secondary to pancytopenia. Chest CT 7/23 with multifocal PNA, small L pleural effusion, multiple lytic lesions. Of note, recent admission for anemia, thrombocytopenia, CAP with d/c 09/24/21. Other PMH includes relapsed IgA kappa plasma cell myeloma, hypothyroidism, PAF, HTN.   Clinical Impression  Pt presents with an overall decrease in functional mobility secondary to above. PTA, pt mod indep without DME, lives alone with PRN assist from daughters. Today, pt able to mobilize in room and perform ADL tasks with intermittent UE support, min guard for balance; noted fatigue and SOB. Pt declines additional mobility progression, no reason specified. Pt would benefit from continued acute PT services to maximize functional mobility and independence prior to d/c home; pt declines recommendation for HHPT services.     SpO2 97% on RA, HR 89    Recommendations for follow up therapy are one component of a multi-disciplinary discharge planning process, led by the attending physician.  Recommendations may be updated based on patient status, additional functional criteria and insurance authorization.  Follow Up Recommendations No PT follow up (declines HHPT)      Assistance Recommended at Discharge Intermittent Supervision/Assistance  Patient can return home with the following  A little help with walking and/or transfers;A little help with bathing/dressing/bathroom;Assistance with cooking/housework;Assist for transportation;Help with stairs or ramp for entrance    Equipment Recommendations None recommended by PT  Recommendations for Other Services   Mobility Specialists   Functional Status Assessment Patient has had a recent decline in their functional status and demonstrates the  ability to make significant improvements in function in a reasonable and predictable amount of time.     Precautions / Restrictions Precautions Precautions: Fall;Other (comment) Precaution Comments: urinary urgency Restrictions Weight Bearing Restrictions: No      Mobility  Bed Mobility Overal bed mobility: Modified Independent             General bed mobility comments: HOB elevated    Transfers Overall transfer level: Needs assistance Equipment used: None, 1 person hand held assist Transfers: Sit to/from Stand Sit to Stand: Min guard           General transfer comment: pt reaching for HHA to stabilize standing from EOB; additional sit<>stands from recliner and BSC without UE support, min guard for balance    Ambulation/Gait Ambulation/Gait assistance: Min guard Gait Distance (Feet): 20 Feet Assistive device: 1 person hand held assist, None Gait Pattern/deviations: Step-through pattern, Decreased stride length, Trunk flexed Gait velocity: decreased     General Gait Details: pt only agreeable to ambulate across room to [facility] and back to recliner, initial HHA to stabilize, then pt reaching to furniture for support; min guard for balance; declines DME use. noted SOB; pt declines further mobility, no reason specified  Stairs            Wheelchair Mobility    Modified Rankin (Stroke Patients Only)       Balance Overall balance assessment: Needs assistance Sitting-balance support: No upper extremity supported Sitting balance-Leahy Scale: Good Sitting balance - Comments: able to don socks sitting edge of recliner, indep with pericare   Standing balance support: No upper extremity supported, Single extremity supported Standing balance-Leahy Scale: Fair Standing balance comment: can static stand and take steps without UE support, preference for single UE support to perform standing pericare  and ambulate                             Pertinent  Vitals/Pain Pain Assessment Pain Assessment: No/denies pain    Home Living Family/patient expects to be discharged to:: Private residence Living Arrangements: Alone Available Help at Discharge: Family;Available PRN/intermittently Type of Home: House Home Access: Stairs to enter Entrance Stairs-Rails: Psychiatric nurse of Steps: 2     Home Equipment: Shower seat - built in;Hand held shower head;Grab bars - tub/shower;Grab bars - toilet;Rollator (4 wheels);Cane - single point Additional Comments: pt appears frustrated by questions; home set-up and PLOF info taken from PT eval 6 days prior at [facility]    Prior Function Prior Level of Function : Driving;Independent/Modified Independent               ADLs Comments: did have help for keeping up her yard     Hand Dominance        Extremity/Trunk Assessment   Upper Extremity Assessment Upper Extremity Assessment: Generalized weakness    Lower Extremity Assessment Lower Extremity Assessment: Generalized weakness    Cervical / Trunk Assessment Cervical / Trunk Assessment: Kyphotic  Communication   Communication: No difficulties  Cognition Arousal/Alertness: Awake/alert Behavior During Therapy: Flat affect Overall Cognitive Status: Within Functional Limits for tasks assessed                                 General Comments: WFL for simple tasks, not formally assessed; pt reports she cannot decide if she wants to stay on acute PT caseload until after she eats her breakfast. pt HOH        General Comments General comments (skin integrity, edema, etc.): SpO2 97% on RA, HR 89. pt with limited desire to participate in physical therapy, when asked if she wants to stay on acute PT caseload, pt reports she cannot decide until after eating breakfast; when discussing follow-up recommendations, pt adamantly declines HHPT services    Exercises     Assessment/Plan    PT Assessment Patient needs continued  PT services  PT Problem List Decreased strength;Decreased mobility;Decreased activity tolerance;Decreased balance;Decreased knowledge of use of DME       PT Treatment Interventions DME instruction;Therapeutic exercise;Gait training;Balance training;Functional mobility training;Therapeutic activities;Patient/family education;Stair training    PT Goals (Current goals can be found in the Care Plan section)  Acute Rehab PT Goals Patient Stated Goal: go home tomorrow; not interested in follow-up PT services PT Goal Formulation: With patient Time For Goal Achievement: 10/13/21 Potential to Achieve Goals: Good    Frequency Min 3X/week     Co-evaluation               AM-PAC PT "6 Clicks" Mobility  Outcome Measure Help needed turning from your back to your side while in a flat bed without using bedrails?: None Help needed moving from lying on your back to sitting on the side of a flat bed without using bedrails?: None Help needed moving to and from a bed to a chair (including a wheelchair)?: A Little Help needed standing up from a chair using your arms (e.g., wheelchair or bedside chair)?: A Little Help needed to walk in hospital room?: A Little Help needed climbing 3-5 steps with a railing? : A Lot 6 Click Score: 19    End of Session   Activity Tolerance: Patient limited by fatigue Patient  left: in chair;with call bell/phone within reach;with chair alarm set Nurse Communication: Mobility status PT Visit Diagnosis: Other abnormalities of gait and mobility (R26.89);Muscle weakness (generalized) (M62.81)    Time: 1165-7903 PT Time Calculation (min) (ACUTE ONLY): 21 min   Charges:   PT Evaluation $PT Eval Low Complexity: [place], PT, DPT Acute Rehabilitation Services  Personal: [facility] Office: [place] 09/29/2021, 9:02 AM

## 2021-09-29 NOTE — Progress Notes (Signed)
OT Cancellation Note  Patient Details Name: Courtney Arnold MRN: [id number] DOB: [birth year]   Cancelled Treatment:    Reason Eval/Treat Not Completed: Patient met seated in recliner. Declined OT eval this date. Reports fatigue and inability to sleep at night. OT to re-attempt 7/26 at which time OT to sign off if patient continue to refuse.   Gloris Manchester OTR/L Supplemental OT, Department of rehab services [phone]  Derin Granquist R H. 09/29/2021, 1:14 PM

## 2021-09-29 NOTE — Progress Notes (Signed)
PROGRESS NOTE        PATIENT DETAILS Name: Courtney Arnold Age: 81 y.o. Sex: female Date of Birth: Jul 18, 1940 Admit Date: 09/27/2021 Admitting Physician Shela Leff, MD GYF:VCBSWHQ, [name], MD  Brief Summary: Patient is a 81 y.o.  female with history of multiple myeloma-pancytopenia-who presented with severe epistaxis and acute blood loss anemia.  See below for further details.   Significant events: 7/23>> transferred to [facility] from [facility] for ENT evaluation-severe epistaxis.  Significant studies: 7/23>> CT chest: Multifocal upper lobe PNA-left> right.  Small left pleural effusion.  Multiple lytic lesions.  Significant microbiology data: 7/23>> blood culture: No growth 7/23>> COVID PCR: Negative  Procedures: None  Consults: None  Subjective: Wants to go home-no epistaxis overnight.  Packing remains in place.  Objective: Vitals: Blood pressure (!) 136/48, pulse 86, temperature 98.3 F (36.8 C), temperature source Oral, resp. rate (!) 25, height 5' 2" (1.575 m), weight 60.8 kg, SpO2 95 %.   Exam: Gen Exam:Alert awake-not in any distress HEENT:atraumatic, normocephalic Chest: B/L clear to auscultation anteriorly CVS:S1S2 regular Abdomen:soft non tender, non distended Extremities:no edema Neurology: Non focal Skin: no rash   Pertinent Labs/Radiology:    Latest Ref Rng & Units 09/29/2021    1:36 AM 09/28/2021   11:04 AM 09/27/2021    5:00 PM  CBC  WBC 4.0 - 10.5 K/uL 2.6  3.5  3.2   Hemoglobin 12.0 - 15.0 g/dL 8.1  9.1  6.2   Hematocrit 36.0 - 46.0 % 24.6  27.2  19.0   Platelets 150 - 400 K/uL 41  54  36     Lab Results  Component Value Date   NA 137 09/29/2021   K 3.6 09/29/2021   CL 105 09/29/2021   CO2 23 09/29/2021      Assessment/Plan: Epistaxis: In the setting of thrombocytopenia-packing in place-ENT following.  No further epistaxis overnight-we will continue to monitor for 1 more day before consideration of discharge.     Pancytopenia due to relapsed IgA multiple myeloma-with bone marrow infiltration-worsening acute blood loss anemia due to epistaxis-worsening thrombocytopenia due to bone marrow involvement/chemotherapy/recent antibiotic use: Difficult situation-discussed with patient's primary oncologist-Dr. Delton Coombes on 7/24-recommends supportive care-and to transfuse if platelet count drops less than 50,000.  Platelet counts decreasing-but no bleeding-watch closely-repeat CBC tomorrow morning.  Aspiration pneumonia: In the setting of epistaxis/vomiting-on room air this morning-immunocompromised due to recent chemotherapy/leukopenia-continue Unasyn for an additional day-for transitioning to oral antimicrobial therapy.  Hypothyroidism: Continue Synthroid  PAF: Not a candidate for anticoagulation-maintaining sinus rhythm.  Monitor on telemetry.  Debility/deconditioning: PT/OT appreciated-refusing home health services.  BMI: Estimated body mass index is 24.52 kg/m as calculated from the following:   Height as of this encounter: 5' 2" (1.575 m).   Weight as of this encounter: 60.8 kg.   Code status:   Code Status: DNR (reconfirmed with patient and daughter at bedside on 7/24)  DVT Prophylaxis: SCDs Start: 09/27/21 2120   Family Communication: Daughter at bedside on 7/25-none at bedside on 7/26.   Disposition Plan: Status is: Inpatient Remains inpatient appropriate because: Severe pancytopenia-epistaxis-aspiration pneumonia-not stable for discharge.  Likely DC on 7/26 if she remains clinically stable.   Planned Discharge Destination:Home health  Diet: Diet Order             Diet Heart Room service appropriate? No; Fluid consistency: Thin  Diet effective  now                     Antimicrobial agents: Anti-infectives (From admission, onward)    Start     Dose/Rate Route Frequency Ordered Stop   09/28/21 2200  vancomycin (VANCOREADY) IVPB 750 mg/150 mL  Status:  Discontinued        750  mg 150 mL/hr over 60 Minutes Intravenous Every 24 hours 09/27/21 2153 09/28/21 0915   09/28/21 1200  Ampicillin-Sulbactam (UNASYN) 3 g in sodium chloride 0.9 % 100 mL IVPB        3 g 200 mL/hr over 30 Minutes Intravenous Every 8 hours 09/28/21 1014     09/27/21 2230  azithromycin (ZITHROMAX) 500 mg in sodium chloride 0.9 % 250 mL IVPB  Status:  Discontinued        500 mg 250 mL/hr over 60 Minutes Intravenous Every 24 hours 09/27/21 2158 09/28/21 0915   09/27/21 2200  acyclovir (ZOVIRAX) tablet 400 mg        400 mg Oral 2 times daily 09/27/21 2126     09/27/21 2200  vancomycin (VANCOREADY) IVPB 1250 mg/250 mL        1,250 mg 166.7 mL/hr over 90 Minutes Intravenous  Once 09/27/21 2153 09/28/21 0256   09/27/21 2200  piperacillin-tazobactam (ZOSYN) IVPB 3.375 g  Status:  Discontinued        3.375 g 12.5 mL/hr over 240 Minutes Intravenous Every 8 hours 09/27/21 2153 09/27/21 2158   09/27/21 2200  cefTRIAXone (ROCEPHIN) 2 g in sodium chloride 0.9 % 100 mL IVPB  Status:  Discontinued        2 g 200 mL/hr over 30 Minutes Intravenous Every 24 hours 09/27/21 2158 09/28/21 0915   09/27/21 2145  Ampicillin-Sulbactam (UNASYN) 3 g in sodium chloride 0.9 % 100 mL IVPB  Status:  Discontinued        3 g 200 mL/hr over 30 Minutes Intravenous Every 8 hours 09/27/21 2136 09/27/21 2145        MEDICATIONS: Scheduled Meds:  acyclovir  400 mg Oral BID   levothyroxine  137 mcg Oral QAC breakfast   Continuous Infusions:  ampicillin-sulbactam (UNASYN) IV Stopped (09/29/21 0410)   PRN Meds:.acetaminophen **OR** acetaminophen, ondansetron (ZOFRAN) IV   I have personally reviewed following labs and imaging studies  LABORATORY DATA: CBC: Recent Labs  Lab 09/23/21 0631 09/24/21 0556 09/27/21 1700 09/28/21 1104 09/29/21 0136  WBC 1.9* 1.8* 3.2* 3.5* 2.6*  HGB 8.5* 7.5* 6.2* 9.1* 8.1*  HCT 25.1* 23.9* 19.0* 27.2* 24.6*  MCV 89.6 95.6 93.1 88.3 89.5  PLT 30* 28* 36* 54* 41*     Basic Metabolic  Panel: Recent Labs  Lab 09/23/21 0631 09/24/21 0556 09/27/21 1700 09/28/21 1104 09/29/21 0136  NA 133* 137 133* 139 137  K 3.6 4.1 3.3* 4.1 3.6  CL 97* 103 97* 106 105  CO2 _0 GLUCOSE 110* 102* 134* 118* 93  BUN 29* _1 CREATININE 1.45* 0.79 0.94 0.77 0.81  CALCIUM 8.5* 7.7* 7.5* 7.2* 6.8*  MG 2.1 1.9  --  2.0 2.0     GFR: Estimated Creatinine Clearance: 47.6 mL/min (by C-G formula based on SCr of 0.81 mg/dL).  Liver Function Tests: Recent Labs  Lab 09/27/21 1700 09/29/21 0136  AST 16 20  ALT 15 18  ALKPHOS 100 82  BILITOT 0.9 0.5  PROT 5.3* 5.0*  ALBUMIN 2.3* 2.0*    No results for input(s): "  LIPASE", "AMYLASE" in the last 168 hours. No results for input(s): "AMMONIA" in the last 168 hours.  Coagulation Profile: Recent Labs  Lab 09/27/21 1700  INR 1.1     Cardiac Enzymes: No results for input(s): "CKTOTAL", "CKMB", "CKMBINDEX", "TROPONINI" in the last 168 hours.  BNP (last 3 results) No results for input(s): "PROBNP" in the last 8760 hours.  Lipid Profile: No results for input(s): "CHOL", "HDL", "LDLCALC", "TRIG", "CHOLHDL", "LDLDIRECT" in the last 72 hours.  Thyroid Function Tests: No results for input(s): "TSH", "T4TOTAL", "FREET4", "T3FREE", "THYROIDAB" in the last 72 hours.  Anemia Panel: No results for input(s): "VITAMINB12", "FOLATE", "FERRITIN", "TIBC", "IRON", "RETICCTPCT" in the last 72 hours.  Urine analysis:    Component Value Date/Time   COLORURINE YELLOW 05/11/2019 [place] 05/11/2019 0819   LABSPEC 1.021 05/11/2019 0819   PHURINE 5.0 05/11/2019 0819   GLUCOSEU NEGATIVE 05/11/2019 0819   HGBUR MODERATE (A) 05/11/2019 0819   BILIRUBINUR NEGATIVE 05/11/2019 0819   KETONESUR NEGATIVE 05/11/2019 0819   PROTEINUR NEGATIVE 05/11/2019 0819   UROBILINOGEN 0.2 10/02/2008 0830   NITRITE NEGATIVE 05/11/2019 0819   LEUKOCYTESUR NEGATIVE 05/11/2019 0819    Sepsis Labs: Lactic Acid, Venous     Component Value Date/Time   LATICACIDVEN 1.0 09/28/2021 1104    MICROBIOLOGY: Recent Results (from the past 240 hour(s))  Culture, blood (Routine X 2) w Reflex to ID Panel     Status: None   Collection Time: 09/23/21  6:30 AM   Specimen: BLOOD  Result Value Ref Range Status   Specimen Description   Final    BLOOD RIGHT ANTECUBITAL Performed at [facility], [place] [address], [place], [place] [address]    Special Requests   Final    BOTTLES DRAWN AEROBIC AND ANAEROBIC Blood Culture adequate volume Performed at [facility], [place] [address] [place], [place] [address]    Culture   Final    NO GROWTH 5 DAYS Performed at [facility], [address] [place], [place] [address]    Report Status 09/28/2021 FINAL  Final  Culture, blood (Routine X 2) w Reflex to ID Panel     Status: None   Collection Time: 09/23/21 11:04 AM   Specimen: BLOOD  Result Value Ref Range Status   Specimen Description BLOOD RIGHT ANTECUBITAL  Final   Special Requests   Final    BOTTLES DRAWN AEROBIC AND ANAEROBIC Blood Culture adequate volume   Culture   Final    NO GROWTH 5 DAYS Performed at [facility], [address], [place], [place] [address]    Report Status 09/28/2021 FINAL  Final  Blood culture (routine x 2)     Status: None (Preliminary result)   Collection Time: 09/27/21  4:49 PM   Specimen: BLOOD  Result Value Ref Range Status   Specimen Description   Final    BLOOD RIGHT ANTECUBITAL Performed at [facility], [place] [address], [place], [place] [address]    Special Requests   Final    BOTTLES DRAWN AEROBIC AND ANAEROBIC Blood Culture results may not be optimal due to an excessive volume of blood received in culture bottles Performed at [place] [address] [place], [place] [address]    Culture   Final    NO GROWTH 2 DAYS Performed at [facility], [address] [place], [place] [address]    Report Status PENDING   Incomplete  Blood culture (routine x  2)     Status: None (Preliminary result)   Collection Time: 09/27/21  5:08 PM   Specimen: BLOOD  Result Value Ref Range Status   Specimen Description   Final    BLOOD LEFT ANTECUBITAL Performed at [facility], [place] [address] [place], [place] [address]    Special Requests   Final    BOTTLES DRAWN AEROBIC AND ANAEROBIC Blood Culture results may not be optimal due to an excessive volume of blood received in culture bottles Performed at [place] [address] [place], [place] [address]    Culture   Final    NO GROWTH 2 DAYS Performed at [facility], [address] [place], [place] [address]    Report Status PENDING  Incomplete  SARS Coronavirus 2 by RT PCR (hospital order, performed in [facility] hospital lab) *cepheid single result test* Throat     Status: None   Collection Time: 09/27/21 10:16 PM   Specimen: Throat; Nasal Swab  Result Value Ref Range Status   SARS Coronavirus 2 by RT PCR NEGATIVE NEGATIVE Final    Comment: (NOTE) SARS-CoV-2 target nucleic acids are NOT DETECTED.  The SARS-CoV-2 RNA is generally detectable in upper and lower respiratory specimens during the acute phase of infection. The lowest concentration of SARS-CoV-2 viral copies this assay can detect is 250 copies / mL. A negative result does not preclude SARS-CoV-2 infection and should not be used as the sole basis for treatment or other patient management decisions.  A negative result may occur with improper specimen collection / handling, submission of specimen other than nasopharyngeal swab, presence of viral mutation(s) within the areas targeted by this assay, and inadequate number of viral copies (<250 copies / mL). A negative result must be combined with clinical observations, patient history, and epidemiological information.  Fact Sheet for Patients:   https://www.patel.info/  Fact Sheet for Healthcare  Providers: https://hall.com/  This test is not yet approved or  cleared by the [place] FDA and has been authorized for detection and/or diagnosis of SARS-CoV-2 by FDA under an Emergency Use Authorization (EUA).  This EUA will remain in effect (meaning this test can be used) for the duration of the COVID-19 declaration under Section 564(b)(1) of the Act, 21 U.S.C. section 360bbb-3(b)(1), unless the authorization is terminated or revoked sooner.  Performed at [facility], [place] [address] [place], [place] [address]     RADIOLOGY STUDIES/RESULTS: CT CHEST WO CONTRAST  Result Date: 09/27/2021 CLINICAL DATA:  Shortness of breath, tachypnea, recent pneumonia EXAM: CT CHEST WITHOUT CONTRAST TECHNIQUE: Multidetector CT imaging of the chest was performed following the standard protocol without IV contrast. RADIATION DOSE REDUCTION: This exam was performed according to the departmental dose-optimization program which includes automated exposure control, adjustment of the mA and/or kV according to patient size and/or use of iterative reconstruction technique. COMPARISON:  Chest radiograph dated 09/27/2021 and 09/22/2021. FINDINGS: Cardiovascular: The heart is normal in size. No pericardial effusion. No evidence of thoracic aortic aneurysm. Atherosclerotic calcifications of the aortic arch. Three vessel coronary atherosclerosis. Mediastinum/Nodes: Small mediastinal lymph nodes as a 90 pathologic CT size criteria. Visualized thyroid is unremarkable. Lungs/Pleura: Consolidative patchy opacity in the left lung apex/left upper lobe with associated air bronchograms in the medial left upper lobe/lingula. Additional mild patchy opacity in the posterior right upper lobe. When coupled with the rapid progression on chest radiographs, this appearance is compatible with multifocal pneumonia. Small left pleural effusion. No pneumothorax. Upper Abdomen: Visualized  upper abdomen is  notable for mild vascular calcifications and a 3.2 cm anterior left upper pole renal cyst. Musculoskeletal: Multiple lytic lesions, including in the medial clavicles, sternum, and multiple thoracic vertebral bodies, compatible with the patient's known multiple myeloma. Prior vertebral augmentation at T5, T6, and T11. Severe compression fracture deformity at T9. IMPRESSION: Multifocal upper lobe pneumonia, left greater than right. Associated small left pleural effusion. Consider follow-up chest radiographs in 4-6 weeks to document clearance. Multiple lytic lesions, compatible with the patient's known multiple myeloma. Aortic Atherosclerosis (ICD10-I70.0). Electronically Signed   By: Julian Hy M.D.   On: 09/27/2021 23:34   DG Chest Port 1 View  Result Date: 09/27/2021 CLINICAL DATA:  Nosebleed EXAM: PORTABLE CHEST 1 VIEW COMPARISON:  09/22/2021 FINDINGS: The heart size and mediastinal contours are within normal limits. Significant interval increase in heterogeneous and consolidative airspace opacity of the left apex. The visualized skeletal structures are unremarkable. IMPRESSION: Significant interval increase in heterogeneous and consolidative airspace opacity of the left apex, concerning for infection. Consider CT to further evaluate. Electronically Signed   By: Delanna Ahmadi M.D.   On: 09/27/2021 17:57     LOS: 2 days   Oren Binet, MD  [facility]    To contact the attending provider between 7A-7P or the covering provider during after hours 7P-7A, please log into the web site [email or web address] and access using universal [place] password for that web site. If you do not have the password, please call the hospital operator.  09/29/2021, 11:20 AM

## 2021-09-29 NOTE — Progress Notes (Signed)
  Transition of Care [facility]) Screening Note   Patient Details  Name: Courtney Arnold Date of Birth: 09-09-1940   Transition of Care [facility]) CM/SW Contact:    Dawayne Patricia, RN Phone Number: 09/29/2021, 2:27 PM    Transition of Care Department [facility]) has reviewed patient and no TOC needs have been identified at this time. We will continue to monitor patient advancement through interdisciplinary progression rounds. If new patient transition needs arise, please place a TOC consult.

## 2021-09-30 DIAGNOSIS — E039 Hypothyroidism, unspecified: Secondary | ICD-10-CM | POA: Diagnosis not present

## 2021-09-30 DIAGNOSIS — E876 Hypokalemia: Secondary | ICD-10-CM | POA: Diagnosis not present

## 2021-09-30 DIAGNOSIS — R040 Epistaxis: Secondary | ICD-10-CM | POA: Diagnosis not present

## 2021-09-30 DIAGNOSIS — D62 Acute posthemorrhagic anemia: Secondary | ICD-10-CM | POA: Diagnosis not present

## 2021-09-30 LAB — CBC
HCT: 27.1 % — ABNORMAL LOW (ref 36.0–46.0)
Hemoglobin: 9.2 g/dL — ABNORMAL LOW (ref 12.0–15.0)
MCH: 30.2 pg (ref 26.0–34.0)
MCHC: 33.9 g/dL (ref 30.0–36.0)
MCV: 88.9 fL (ref 80.0–100.0)
Platelets: 29 10*3/uL — CL (ref 150–400)
RBC: 3.05 MIL/uL — ABNORMAL LOW (ref 3.87–5.11)
RDW: 15.5 % (ref 11.5–15.5)
WBC: 2.9 10*3/uL — ABNORMAL LOW (ref 4.0–10.5)
nRBC: 0 % (ref 0.0–0.2)

## 2021-09-30 MED ORDER — ACETAMINOPHEN 325 MG PO TABS
650.0000 mg | ORAL_TABLET | Freq: Once | ORAL | Status: AC
  Administered 2021-09-30: 650 mg via ORAL
  Filled 2021-09-30: qty 2

## 2021-09-30 MED ORDER — SODIUM CHLORIDE 0.9% IV SOLUTION: Freq: Once | INTRAVENOUS | Status: DC

## 2021-09-30 MED ORDER — DIPHENHYDRAMINE HCL 50 MG/ML IJ SOLN
25.0000 mg | Freq: Once | INTRAMUSCULAR | Status: AC
  Administered 2021-09-30: 25 mg via INTRAVENOUS
  Filled 2021-09-30: qty 1

## 2021-09-30 MED ORDER — FLUCONAZOLE 100 MG PO TABS
100.0000 mg | ORAL_TABLET | Freq: Every day | ORAL | Status: DC
  Administered 2021-09-30 – 2021-10-02 (×3): 100 mg via ORAL
  Filled 2021-09-30 (×3): qty 1

## 2021-09-30 MED ORDER — FUROSEMIDE 10 MG/ML IJ SOLN
20.0000 mg | Freq: Once | INTRAMUSCULAR | Status: AC
  Administered 2021-09-30: 20 mg via INTRAVENOUS
  Filled 2021-09-30: qty 2

## 2021-09-30 NOTE — Progress Notes (Signed)
Platelet transfusion ended at 1345

## 2021-09-30 NOTE — Evaluation (Signed)
Occupational Therapy Evaluation Patient Details Name: Courtney Arnold MRN: [id number] DOB: 04-03-1940 Today's Date: 09/30/2021   History of Present Illness Pt is an 81 y.o. female transfer from [facility] to [facility] 09/27/21 with epistaxis secondary to pancytopenia. Chest CT 7/23 with multifocal PNA, small L pleural effusion, multiple lytic lesions. Of note, recent admission for anemia, thrombocytopenia, CAP with d/c 09/24/21. Other PMH includes relapsed IgA kappa plasma cell myeloma, hypothyroidism, PAF, HTN.   Clinical Impression   Pt PTA: Pt living with family, but reports independence with mobility and ADL tasks. Daughter describes patient's home as very small and rooms are close together. Often she uses sturdy furniture to walk around her small home and narrow hallways.  Pt supervisionA overall for OOB ADL and mobility. Pt does not require continued OT skilled services as pt appears to be at her functional baseline for OOB mobility and ADL tasks in standing and sitting with no AD. Pt and daughter agree that pt is close to her baseline and pt is easily irritated and wants to go home, but the daughter reminds her frequently that this is out of their control and working with therapy is good for her discharge plan. Pt does not require continued OT skilled services. OT signing off. Thank you.     Recommendations for follow up therapy are one component of a multi-disciplinary discharge planning process, led by the attending physician.  Recommendations may be updated based on patient status, additional functional criteria and insurance authorization.   Follow Up Recommendations  No OT follow up    Assistance Recommended at Discharge Set up Supervision/Assistance  Patient can return home with the following A little help with bathing/dressing/bathroom;Assist for transportation;Help with stairs or ramp for entrance    Functional Status Assessment  Patient has had a recent decline in their functional status and  demonstrates the ability to make significant improvements in function in a reasonable and predictable amount of time.  Equipment Recommendations  None recommended by OT    Recommendations for Other Services       Precautions / Restrictions Precautions Precautions: Fall;Other (comment) Precaution Comments: urinary urgency Restrictions Weight Bearing Restrictions: No      Mobility Bed Mobility Overal bed mobility: Modified Independent             General bed mobility comments: HOB elevated    Transfers Overall transfer level: Needs assistance Equipment used: None Transfers: Sit to/from Stand Sit to Stand: Supervision                  Balance Overall balance assessment: Needs assistance Sitting-balance support: No upper extremity supported Sitting balance-Leahy Scale: Good Sitting balance - Comments: able to don socks sitting edge of recliner, indep with pericare   Standing balance support: No upper extremity supported, Single extremity supported Standing balance-Leahy Scale: Fair Standing balance comment: can static stand and take steps without UE support, preference for single UE support to perform standing pericare and ambulate                           ADL either performed or assessed with clinical judgement   ADL Overall ADL's : At baseline;Modified independent                                       General ADL Comments: Pt supervisionA level for OOB ADL tasks, ambulating with  no AD in room occasionally using furniture to walk     Vision Baseline Vision/History: 1 Wears glasses Ability to See in Adequate Light: 0 Adequate Patient Visual Report: No change from baseline Vision Assessment?: No apparent visual deficits Additional Comments: Pt begrudgingly reading clock and rolling eyes when asked orientation questions- asked to use cues in room.     Perception     Praxis      Pertinent Vitals/Pain Pain Assessment Pain  Assessment: Faces Faces Pain Scale: No hurt     Hand Dominance Right   Extremity/Trunk Assessment Upper Extremity Assessment Upper Extremity Assessment: Overall WFL for tasks assessed   Lower Extremity Assessment Lower Extremity Assessment: Overall WFL for tasks assessed   Cervical / Trunk Assessment Cervical / Trunk Assessment: Kyphotic   Communication Communication Communication: HOH   Cognition Arousal/Alertness: Awake/alert Behavior During Therapy: Flat affect Overall Cognitive Status: Within Functional Limits for tasks assessed                                       General Comments  VSS on RA. Pt and daughter agree that pt appears close to her baseline. HR <105 BPM with exertion.    Exercises     Shoulder Instructions      Home Living Family/patient expects to be discharged to:: Private residence Living Arrangements: Alone Available Help at Discharge: Family;Available PRN/intermittently Type of Home: House Home Access: Stairs to enter [employer] of Steps: 2 Entrance Stairs-Rails: Right;Left Home Layout: Multi-level;Other (Comment)     Bathroom Shower/Tub: Occupational psychologist: Handicapped height     Home Equipment: Shower seat - built in;Hand held shower head;Grab bars - tub/shower;Grab bars - toilet;Rollator (4 wheels);Cane - single point          Prior Functioning/Environment Prior Level of Function : Driving;Independent/Modified Independent               ADLs Comments: Assist with iADL; daughters involved for ADL/iADL needs        OT Problem List: Decreased activity tolerance      OT Treatment/Interventions:      OT Goals(Current goals can be found in the care plan section) Acute Rehab OT Goals Patient Stated Goal: to go home and be left alone OT Goal Formulation: All assessment and education complete, DC therapy Potential to Achieve Goals: Good  OT Frequency:      Co-evaluation               AM-PAC OT "6 Clicks" Daily Activity     Outcome Measure Help from another person eating meals?: None Help from another person taking care of personal grooming?: None Help from another person toileting, which includes using toliet, bedpan, or urinal?: None Help from another person bathing (including washing, rinsing, drying)?: A Little Help from another person to put on and taking off regular upper body clothing?: None Help from another person to put on and taking off regular lower body clothing?: None 6 Click Score: 23   End of Session Equipment Utilized During Treatment: Gait belt Nurse Communication: Mobility status  Activity Tolerance: Patient tolerated treatment well Patient left: in chair;with call bell/phone within reach;with chair alarm set;with family/visitor present  OT Visit Diagnosis: Unsteadiness on feet (R26.81)                Time: 1610-9604 OT Time Calculation (min): 20 min Charges:  OT General Charges $  OT Visit: 1 Visit OT Evaluation $OT Eval Moderate Complexity: 1 Mod  Jefferey Pica, OTR/L Acute Rehabilitation Services Office: [phone]   Jerene Pitch 09/30/2021, 1:35 PM

## 2021-09-30 NOTE — Progress Notes (Signed)
PT Cancellation Note  Patient Details Name: Courtney Arnold MRN: [id number] DOB: 11-17-1940   Cancelled Treatment:    Reason Eval/Treat Not Completed: Other (comment), RN giving platelets, will check back as schedule allows to continue with PT POC.  Audry Riles. PTA Acute Rehabilitation Services Office: [place] 09/30/2021, 10:35 AM

## 2021-09-30 NOTE — Plan of Care (Signed)
?  Problem: Clinical Measurements: ?Goal: Will remain free from infection ?Outcome: Progressing ?Goal: Diagnostic test results will improve ?Outcome: Progressing ?Goal: Respiratory complications will improve ?Outcome: Progressing ?  ?

## 2021-09-30 NOTE — Progress Notes (Signed)
PROGRESS NOTE        PATIENT DETAILS Name: Courtney Arnold Age: 81 y.o. Sex: female Date of Birth: 25-Feb-1941 Admit Date: 09/27/2021 Admitting Physician Shela Leff, MD [id number], [name], MD  Brief Summary: Patient is a 81 y.o.  female with history of multiple myeloma-pancytopenia-who presented with severe epistaxis and acute blood loss anemia.  See below for further details.   Significant events: 7/23>> transferred to [facility] from [facility] for ENT evaluation-severe epistaxis.  Significant studies: 7/23>> CT chest: Multifocal upper lobe PNA-left> right.  Small left pleural effusion.  Multiple lytic lesions.  Significant microbiology data: 7/23>> blood culture: No growth 7/23>> COVID PCR: Negative  Procedures: None  Consults: None  Subjective: No epistaxis.  No other major issues overnight.  Lying comfortably in bed.  Inquiring about discharge plans.  Objective: Vitals: Blood pressure (!) 155/66, pulse 84, temperature 98.2 F (36.8 C), temperature source Oral, resp. rate 20, height 5' 2"  (1.575 m), weight 60.8 kg, SpO2 96 %.   Exam: Gen Exam:Alert awake-not in any distress HEENT:atraumatic, normocephalic Chest: B/L clear to auscultation anteriorly CVS:S1S2 regular Abdomen:soft non tender, non distended Extremities:no edema Neurology: Non focal Skin: no rash   Pertinent Labs/Radiology:    Latest Ref Rng & Units 09/30/2021    7:51 AM 09/29/2021    1:36 AM 09/28/2021   11:04 AM  CBC  WBC 4.0 - 10.5 K/uL 2.9  2.6  3.5   Hemoglobin 12.0 - 15.0 g/dL 9.2  8.1  9.1   Hematocrit 36.0 - 46.0 % 27.1  24.6  27.2   Platelets 150 - 400 K/uL 29  41  54     Lab Results  Component Value Date   NA 137 09/29/2021   K 3.6 09/29/2021   CL 105 09/29/2021   CO2 23 09/29/2021      Assessment/Plan: Epistaxis: In the setting of thrombocytopenia-packing in place-ENT following.  No further epistaxis for almost 48 hours.    Pancytopenia due to relapsed  IgA multiple myeloma-with bone marrow infiltration-worsening acute blood loss anemia due to epistaxis-worsening thrombocytopenia due to bone marrow involvement/chemotherapy/recent antibiotic use: Hemoglobin has stabilized after 2 units of PRBC transfusion on admission-unfortunately platelet count continues to decrease.  Discussed with oncologist-Dr. Delton Coombes on 7/26-recommends 1 unit of pheresis platelets.    Aspiration pneumonia: In the setting of epistaxis/vomiting-on room air this morning-immunocompromised due to recent chemotherapy/leukopenia-continue IV Unasyn-we will transition to Augmentin on discharge.  Hypothyroidism: Continue Synthroid  PAF: Not a candidate for anticoagulation-maintaining sinus rhythm.  Monitor on telemetry.  Debility/deconditioning: PT/OT appreciated-refusing home health services.  BMI: Estimated body mass index is 24.52 kg/m as calculated from the following:   Height as of this encounter: 5' 2"  (1.575 m).   Weight as of this encounter: 60.8 kg.   Code status:   Code Status: DNR (reconfirmed with patient and daughter at bedside on 7/24)  DVT Prophylaxis: SCDs Start: 09/27/21 2120   Family Communication: Daughter-[name] Williams-[phone]-updated over the phone on 7/26.    Disposition Plan: Status is: Inpatient Remains inpatient appropriate because: Severe pancytopenia-epistaxis-aspiration pneumonia-not stable for discharge.  Likely DC on 7/27-if no further bleeding/platelet count stabilizes.   Planned Discharge Destination:Home health  Diet: Diet Order             Diet Heart Room service appropriate? No; Fluid consistency: Thin  Diet effective now  Antimicrobial agents: Anti-infectives (From admission, onward)    Start     Dose/Rate Route Frequency Ordered Stop   09/28/21 2200  vancomycin (VANCOREADY) IVPB 750 mg/150 mL  Status:  Discontinued        750 mg 150 mL/hr over 60 Minutes Intravenous Every 24 hours 09/27/21  2153 09/28/21 0915   09/28/21 1200  Ampicillin-Sulbactam (UNASYN) 3 g in sodium chloride 0.9 % 100 mL IVPB        3 g 200 mL/hr over 30 Minutes Intravenous Every 8 hours 09/28/21 1014     09/27/21 2230  azithromycin (ZITHROMAX) 500 mg in sodium chloride 0.9 % 250 mL IVPB  Status:  Discontinued        500 mg 250 mL/hr over 60 Minutes Intravenous Every 24 hours 09/27/21 2158 09/28/21 0915   09/27/21 2200  acyclovir (ZOVIRAX) tablet 400 mg        400 mg Oral 2 times daily 09/27/21 2126     09/27/21 2200  vancomycin (VANCOREADY) IVPB 1250 mg/250 mL        1,250 mg 166.7 mL/hr over 90 Minutes Intravenous  Once 09/27/21 2153 09/28/21 0256   09/27/21 2200  piperacillin-tazobactam (ZOSYN) IVPB 3.375 g  Status:  Discontinued        3.375 g 12.5 mL/hr over 240 Minutes Intravenous Every 8 hours 09/27/21 2153 09/27/21 2158   09/27/21 2200  cefTRIAXone (ROCEPHIN) 2 g in sodium chloride 0.9 % 100 mL IVPB  Status:  Discontinued        2 g 200 mL/hr over 30 Minutes Intravenous Every 24 hours 09/27/21 2158 09/28/21 0915   09/27/21 2145  Ampicillin-Sulbactam (UNASYN) 3 g in sodium chloride 0.9 % 100 mL IVPB  Status:  Discontinued        3 g 200 mL/hr over 30 Minutes Intravenous Every 8 hours 09/27/21 2136 09/27/21 2145        MEDICATIONS: Scheduled Meds:  sodium chloride   Intravenous Once   acetaminophen  650 mg Oral Once   acyclovir  400 mg Oral BID   diphenhydrAMINE  25 mg Intravenous Once   furosemide  20 mg Intravenous Once   levothyroxine  137 mcg Oral QAC breakfast   Continuous Infusions:  ampicillin-sulbactam (UNASYN) IV Stopped (09/30/21 0357)   PRN Meds:.acetaminophen **OR** acetaminophen, ondansetron (ZOFRAN) IV, oxymetazoline   I have personally reviewed following labs and imaging studies  LABORATORY DATA: CBC: Recent Labs  Lab 09/24/21 0556 09/27/21 1700 09/28/21 1104 09/29/21 0136 09/30/21 0751  WBC 1.8* 3.2* 3.5* 2.6* 2.9*  HGB 7.5* 6.2* 9.1* 8.1* 9.2*  HCT 23.9*  19.0* 27.2* 24.6* 27.1*  MCV 95.6 93.1 88.3 89.5 88.9  PLT 28* 36* 54* 41* 29*     Basic Metabolic Panel: Recent Labs  Lab 09/24/21 0556 09/27/21 1700 09/28/21 1104 09/29/21 0136  NA 137 133* 139 137  K 4.1 3.3* 4.1 3.6  CL 103 97* 106 105  CO2 27 28 25 23   GLUCOSE 102* 134* 118* 93  BUN 15 23 17 14   CREATININE 0.79 0.94 0.77 0.81  CALCIUM 7.7* 7.5* 7.2* 6.8*  MG 1.9  --  2.0 2.0     GFR: Estimated Creatinine Clearance: 47.6 mL/min (by C-G formula based on SCr of 0.81 mg/dL).  Liver Function Tests: Recent Labs  Lab 09/27/21 1700 09/29/21 0136  AST 16 20  ALT 15 18  ALKPHOS 100 82  BILITOT 0.9 0.5  PROT 5.3* 5.0*  ALBUMIN 2.3* 2.0*    No results for  input(s): "LIPASE", "AMYLASE" in the last 168 hours. No results for input(s): "AMMONIA" in the last 168 hours.  Coagulation Profile: Recent Labs  Lab 09/27/21 1700  INR 1.1     Cardiac Enzymes: No results for input(s): "CKTOTAL", "CKMB", "CKMBINDEX", "TROPONINI" in the last 168 hours.  BNP (last 3 results) No results for input(s): "PROBNP" in the last 8760 hours.  Lipid Profile: No results for input(s): "CHOL", "HDL", "LDLCALC", "TRIG", "CHOLHDL", "LDLDIRECT" in the last 72 hours.  Thyroid Function Tests: No results for input(s): "TSH", "T4TOTAL", "FREET4", "T3FREE", "THYROIDAB" in the last 72 hours.  Anemia Panel: No results for input(s): "VITAMINB12", "FOLATE", "FERRITIN", "TIBC", "IRON", "RETICCTPCT" in the last 72 hours.  Urine analysis:    Component Value Date/Time   COLORURINE YELLOW 05/11/2019 [place] 05/11/2019 0819   LABSPEC 1.021 05/11/2019 0819   PHURINE 5.0 05/11/2019 0819   GLUCOSEU NEGATIVE 05/11/2019 0819   HGBUR MODERATE (A) 05/11/2019 0819   BILIRUBINUR NEGATIVE 05/11/2019 0819   KETONESUR NEGATIVE 05/11/2019 0819   PROTEINUR NEGATIVE 05/11/2019 0819   UROBILINOGEN 0.2 10/02/2008 0830   NITRITE NEGATIVE 05/11/2019 0819   LEUKOCYTESUR NEGATIVE 05/11/2019 0819     Sepsis Labs: Lactic Acid, Venous    Component Value Date/Time   LATICACIDVEN 1.0 09/28/2021 1104    MICROBIOLOGY: Recent Results (from the past 240 hour(s))  Culture, blood (Routine X 2) w Reflex to ID Panel     Status: None   Collection Time: 09/23/21  6:30 AM   Specimen: BLOOD  Result Value Ref Range Status   Specimen Description   Final    BLOOD RIGHT ANTECUBITAL Performed at [facility], [place] [address] [place], [place] [address]    Special Requests   Final    BOTTLES DRAWN AEROBIC AND ANAEROBIC Blood Culture adequate volume Performed at [facility], [place] [address] [place], [place] [address]    Culture   Final    NO GROWTH 5 DAYS Performed at [facility], [address], [place], [place] [address]    Report Status 09/28/2021 FINAL  Final  Culture, blood (Routine X 2) w Reflex to ID Panel     Status: None   Collection Time: 09/23/21 11:04 AM   Specimen: BLOOD  Result Value Ref Range Status   Specimen Description BLOOD RIGHT ANTECUBITAL  Final   Special Requests   Final    BOTTLES DRAWN AEROBIC AND ANAEROBIC Blood Culture adequate volume   Culture   Final    NO GROWTH 5 DAYS Performed at [facility], [address], [place], [place] [address]    Report Status 09/28/2021 FINAL  Final  Blood culture (routine x 2)     Status: None (Preliminary result)   Collection Time: 09/27/21  4:49 PM   Specimen: BLOOD  Result Value Ref Range Status   Specimen Description   Final    BLOOD RIGHT ANTECUBITAL Performed at [facility], [place] [address], [place], [place] [address]    Special Requests   Final    BOTTLES DRAWN AEROBIC AND ANAEROBIC Blood Culture results may not be optimal due to an excessive volume of blood received in culture bottles Performed at [place] [address] [place], [place] [address]    Culture   Final    NO GROWTH 3 DAYS Performed at [facility], [address] [place],  [place] [address]    Report Status PENDING  Incomplete  Blood culture (routine  x 2)     Status: None (Preliminary result)   Collection Time: 09/27/21  5:08 PM   Specimen: BLOOD  Result Value Ref Range Status   Specimen Description   Final    BLOOD LEFT ANTECUBITAL Performed at [facility], [place] [address], [place], [place] [address]    Special Requests   Final    BOTTLES DRAWN AEROBIC AND ANAEROBIC Blood Culture results may not be optimal due to an excessive volume of blood received in culture bottles Performed at [place] [address] [place], [place] [address]    Culture   Final    NO GROWTH 3 DAYS Performed at [facility], [address] [place], [place] [address]    Report Status PENDING  Incomplete  SARS Coronavirus 2 by RT PCR (hospital order, performed in [facility] hospital lab) *cepheid single result test* Throat     Status: None   Collection Time: 09/27/21 10:16 PM   Specimen: Throat; Nasal Swab  Result Value Ref Range Status   SARS Coronavirus 2 by RT PCR NEGATIVE NEGATIVE Final    Comment: (NOTE) SARS-CoV-2 target nucleic acids are NOT DETECTED.  The SARS-CoV-2 RNA is generally detectable in upper and lower respiratory specimens during the acute phase of infection. The lowest concentration of SARS-CoV-2 viral copies this assay can detect is 250 copies / mL. A negative result does not preclude SARS-CoV-2 infection and should not be used as the sole basis for treatment or other patient management decisions.  A negative result may occur with improper specimen collection / handling, submission of specimen other than nasopharyngeal swab, presence of viral mutation(s) within the areas targeted by this assay, and inadequate number of viral copies (<250 copies / mL). A negative result must be combined with clinical observations, patient history, and epidemiological information.  Fact Sheet for Patients:   https://www.patel.info/  Fact  Sheet for Healthcare Providers: https://hall.com/  This test is not yet approved or  cleared by the [place] FDA and has been authorized for detection and/or diagnosis of SARS-CoV-2 by FDA under an Emergency Use Authorization (EUA).  This EUA will remain in effect (meaning this test can be used) for the duration of the COVID-19 declaration under Section 564(b)(1) of the Act, 21 U.S.C. section 360bbb-3(b)(1), unless the authorization is terminated or revoked sooner.  Performed at [facility], [place] [address], [place], [place] [address]     RADIOLOGY STUDIES/RESULTS: No results found.   LOS: 3 days   Oren Binet, MD  [facility]    To contact the attending provider between 7A-7P or the covering provider during after hours 7P-7A, please log into the web site [email or web address] and access using universal [place] password for that web site. If you do not have the password, please call the hospital operator.  09/30/2021, 10:04 AM

## 2021-09-30 NOTE — Consult Note (Signed)
   [facility] Inpatient Consult   09/30/2021  Courtney Arnold Jul 30, 1940 [id number]  [facility] [ACO] Patient: Medicare ACO REACH  Primary Care Provider:  [name], MD, [facility], is an Independent embedded provider with a Chronic Care Management team and program, and is listed for the transition of care follow up and appointments.  Patient was screened for  readmission request follow up for less than 7 days and showing as Medium risk score. Patient noted to being followed by the Oncology team per encounter.  Plan:  Follow for disposition team and update the referral for readmission follow up.  Please contact for further questions,  Natividad Brood, RN BSN [facility] Liaison  [phone] business mobile phone Toll free office [phone]  Fax number: [phone] [email or web address]

## 2021-09-30 NOTE — Progress Notes (Signed)
Physical Therapy Treatment Patient Details Name: Courtney Arnold MRN: [id number] DOB: 09-10-1940 Today's Date: 09/30/2021   History of Present Illness Pt is an 81 y.o. female transfer from [facility] to [facility] 09/27/21 with epistaxis secondary to pancytopenia. Chest CT 7/23 with multifocal PNA, small L pleural effusion, multiple lytic lesions. Of note, recent admission for anemia, thrombocytopenia, CAP with d/c 09/24/21. Other PMH includes relapsed IgA kappa plasma cell myeloma, hypothyroidism, PAF, HTN.    PT Comments    Pt received supine and agreeable to ambulation in room to recliner, however session limited as pt lunch tray present and pt requesting to eat and declining further mobility and exercise. Pt demonstrating ambulation with min guard "furniture surfing" throughout room without LOB. Pt becoming agitated during LE therex, and requesting session end. Pt continues to benefit from skilled PT services to progress toward functional mobility goals.    Recommendations for follow up therapy are one component of a multi-disciplinary discharge planning process, led by the attending physician.  Recommendations may be updated based on patient status, additional functional criteria and insurance authorization.  Follow Up Recommendations  No PT follow up     Assistance Recommended at Discharge Intermittent Supervision/Assistance  Patient can return home with the following A little help with walking and/or transfers;A little help with bathing/dressing/bathroom;Assistance with cooking/housework;Assist for transportation;Help with stairs or ramp for entrance   Equipment Recommendations  None recommended by PT    Recommendations for Other Services       Precautions / Restrictions Precautions Precautions: Fall;Other (comment) Precaution Comments: urinary urgency Restrictions Weight Bearing Restrictions: No     Mobility  Bed Mobility Overal bed mobility: Modified Independent              General bed mobility comments: HOB elevated    Transfers Overall transfer level: Needs assistance Equipment used: None Transfers: Sit to/from Stand Sit to Stand: Supervision           General transfer comment: pt reaching for HHA to stabilize standing from EOB;    Ambulation/Gait Ambulation/Gait assistance: Min guard Gait Distance (Feet): 12 Feet Assistive device: 1 person hand held assist, None Gait Pattern/deviations: Step-through pattern, Decreased stride length, Trunk flexed Gait velocity: decreased     General Gait Details: pt only agreeable to ambulate across room to to recliner to eat lunch, initial HHA to stabilize, then pt reaching to furniture for support; min guard for balance; declines DME use. pt declines further mobility, no reason specified   Stairs             Wheelchair Mobility    Modified Rankin (Stroke Patients Only)       Balance Overall balance assessment: Needs assistance Sitting-balance support: No upper extremity supported Sitting balance-Leahy Scale: Good Sitting balance - Comments: able to don socks sitting edge of recliner, indep with pericare   Standing balance support: No upper extremity supported, Single extremity supported Standing balance-Leahy Scale: Fair Standing balance comment: can static stand and take steps without UE support, preference for single UE support to perform standing pericare and ambulate                            Cognition Arousal/Alertness: Awake/alert Behavior During Therapy: Flat affect Overall Cognitive Status: Within Functional Limits for tasks assessed  Exercises General Exercises - Lower Extremity Long Arc Quad: Both, 10 reps, Seated Hip Flexion/Marching: AROM, Right, Left, 10 reps, Seated    General Comments General comments (skin integrity, edema, etc.): VSS on RA      Pertinent Vitals/Pain Pain Assessment Pain  Assessment: Faces Faces Pain Scale: No hurt    Home Living Family/patient expects to be discharged to:: Private residence Living Arrangements: Alone Available Help at Discharge: Family;Available PRN/intermittently Type of Home: House Home Access: Stairs to enter Entrance Stairs-Rails: Right;Left Entrance Stairs-Number of Steps: 2   Home Layout: Multi-level;Other (Comment) Home Equipment: Shower seat - built in;Hand held shower head;Grab bars - tub/shower;Grab bars - toilet;Rollator (4 wheels);Cane - single point      Prior Function            PT Goals (current goals can now be found in the care plan section) Acute Rehab PT Goals PT Goal Formulation: With patient Time For Goal Achievement: 10/13/21    Frequency    Min 3X/week      PT Plan      Co-evaluation              AM-PAC PT "6 Clicks" Mobility   Outcome Measure  Help needed turning from your back to your side while in a flat bed without using bedrails?: None Help needed moving from lying on your back to sitting on the side of a flat bed without using bedrails?: None Help needed moving to and from a bed to a chair (including a wheelchair)?: A Little Help needed standing up from a chair using your arms (e.g., wheelchair or bedside chair)?: A Little Help needed to walk in hospital room?: A Little Help needed climbing 3-5 steps with a railing? : A Lot 6 Click Score: 19    End of Session   Activity Tolerance: Patient limited by fatigue Patient left: in chair;with call bell/phone within reach;with family/visitor present Nurse Communication: Mobility status PT Visit Diagnosis: Other abnormalities of gait and mobility (R26.89);Muscle weakness (generalized) (M62.81)     Time: 9233-0076 PT Time Calculation (min) (ACUTE ONLY): 11 min  Charges:  $Therapeutic Activity: 8-22 mins                     Defne Gerling R. PTA Acute Rehabilitation Services Office: [place] 09/30/2021, 2:20 PM

## 2021-10-01 DIAGNOSIS — D62 Acute posthemorrhagic anemia: Secondary | ICD-10-CM | POA: Diagnosis not present

## 2021-10-01 DIAGNOSIS — E039 Hypothyroidism, unspecified: Secondary | ICD-10-CM | POA: Diagnosis not present

## 2021-10-01 DIAGNOSIS — E876 Hypokalemia: Secondary | ICD-10-CM | POA: Diagnosis not present

## 2021-10-01 DIAGNOSIS — R040 Epistaxis: Secondary | ICD-10-CM | POA: Diagnosis not present

## 2021-10-01 LAB — CBC
HCT: 26 % — ABNORMAL LOW (ref 36.0–46.0)
Hemoglobin: 8.5 g/dL — ABNORMAL LOW (ref 12.0–15.0)
MCH: 29 pg (ref 26.0–34.0)
MCHC: 32.7 g/dL (ref 30.0–36.0)
MCV: 88.7 fL (ref 80.0–100.0)
Platelets: 43 10*3/uL — ABNORMAL LOW (ref 150–400)
RBC: 2.93 MIL/uL — ABNORMAL LOW (ref 3.87–5.11)
RDW: 15.1 % (ref 11.5–15.5)
WBC: 2.8 10*3/uL — ABNORMAL LOW (ref 4.0–10.5)
nRBC: 0 % (ref 0.0–0.2)

## 2021-10-01 LAB — BPAM PLATELET PHERESIS
Blood Product Expiration Date: 202307272359
ISSUE DATE / TIME: 202307261048
Unit Type and Rh: 5100

## 2021-10-01 LAB — PREPARE PLATELET PHERESIS: Unit division: 0

## 2021-10-01 NOTE — Progress Notes (Signed)
Physical Therapy Treatment Patient Details Name: Courtney Arnold MRN: [id number] DOB: [birth year] Today's Date: 10/01/2021   History of Present Illness Pt is an 81 y.o. female transfer from [facility] to [facility] 09/27/21 with epistaxis secondary to pancytopenia. Chest CT 7/23 with multifocal PNA, small L pleural effusion, multiple lytic lesions. Of note, recent admission for anemia, thrombocytopenia, CAP with d/c 09/24/21. Other PMH includes relapsed IgA kappa plasma cell myeloma, hypothyroidism, PAF, HTN.    PT Comments    Pt received supine and agreeable to room level ambulation with encouragement. Pt reaching for initial HHA upon coming to stand to steady and requiring min assist with HHA when ambulating in areas of room where pt unable to reach for furniture, when pt able to hold onto furniture pt requiring min guard for safety. Pt declining further ambulation and exercise despite encouragement. Pt continued to decline DME use. Pt continues to benefit from skilled PT services to progress toward functional mobility goals.    Recommendations for follow up therapy are one component of a multi-disciplinary discharge planning process, led by the attending physician.  Recommendations may be updated based on patient status, additional functional criteria and insurance authorization.  Follow Up Recommendations  No PT follow up     Assistance Recommended at Discharge Intermittent Supervision/Assistance  Patient can return home with the following A little help with walking and/or transfers;A little help with bathing/dressing/bathroom;Assistance with cooking/housework;Assist for transportation;Help with stairs or ramp for entrance   Equipment Recommendations  None recommended by PT    Recommendations for Other Services       Precautions / Restrictions Precautions Precautions: Fall;Other (comment) Precaution Comments: urinary urgency Restrictions Weight Bearing Restrictions: No     Mobility  Bed  Mobility Overal bed mobility: Modified Independent             General bed mobility comments: HOB elevated, increased time and use of bedrails    Transfers Overall transfer level: Needs assistance Equipment used: None Transfers: Sit to/from Stand Sit to Stand: Supervision           General transfer comment: pt reaching for HHA to stabilize standing from EOB    Ambulation/Gait Ambulation/Gait assistance: Min guard Gait Distance (Feet): 50 Feet Assistive device: 1 person hand held assist, None Gait Pattern/deviations: Step-through pattern, Decreased stride length, Trunk flexed Gait velocity: decreased     General Gait Details: pt agreeable to in room ambualtion only, initial HHA to stabilize, then pt reaching to furniture for support; min guard for balance; declines DME use. pt declines further mobility, no reason specified   Stairs             Wheelchair Mobility    Modified Rankin (Stroke Patients Only)       Balance Overall balance assessment: Needs assistance Sitting-balance support: No upper extremity supported Sitting balance-Leahy Scale: Good Sitting balance - Comments: able to don socks sitting edge of recliner, indep with pericare   Standing balance support: No upper extremity supported, Single extremity supported Standing balance-Leahy Scale: Fair Standing balance comment: can static stand and take steps without UE support, preference for single UE support to perform standing pericare and ambulate                            Cognition Arousal/Alertness: Awake/alert Behavior During Therapy: Flat affect Overall Cognitive Status: Within Functional Limits for tasks assessed  Exercises      General Comments General comments (skin integrity, edema, etc.): HR 95-117      Pertinent Vitals/Pain Pain Assessment Pain Assessment: Faces Faces Pain Scale: Hurts a little bit Pain  Location: nose Pain Descriptors / Indicators: Discomfort, Nagging Pain Intervention(s): Monitored during session    Home Living                          Prior Function            PT Goals (current goals can now be found in the care plan section) Acute Rehab PT Goals PT Goal Formulation: With patient Time For Goal Achievement: 10/13/21    Frequency    Min 3X/week      PT Plan      Co-evaluation              AM-PAC PT "6 Clicks" Mobility   Outcome Measure  Help needed turning from your back to your side while in a flat bed without using bedrails?: None Help needed moving from lying on your back to sitting on the side of a flat bed without using bedrails?: None Help needed moving to and from a bed to a chair (including a wheelchair)?: A Little Help needed standing up from a chair using your arms (e.g., wheelchair or bedside chair)?: A Little Help needed to walk in hospital room?: A Little Help needed climbing 3-5 steps with a railing? : A Lot 6 Click Score: 19    End of Session   Activity Tolerance: Patient limited by fatigue Patient left: with call bell/phone within reach;in bed Nurse Communication: Mobility status PT Visit Diagnosis: Other abnormalities of gait and mobility (R26.89);Muscle weakness (generalized) (M62.81)     Time: 1470-9295 PT Time Calculation (min) (ACUTE ONLY): 16 min  Charges:  $Therapeutic Activity: 8-22 mins                     Selda Jalbert R. PTA Acute Rehabilitation Services Office: [place] 10/01/2021, 10:05 AM

## 2021-10-01 NOTE — Progress Notes (Signed)
PROGRESS NOTE        PATIENT DETAILS Name: Courtney Arnold Age: 81 y.o. Sex: female Date of Birth: 1940/11/23 Admit Date: 09/27/2021 Admitting Physician Shela Leff, MD [facility]:[email or web address], [name], MD  Brief Summary: Patient is a 81 y.o.  female with history of multiple myeloma-pancytopenia-who presented with severe epistaxis and acute blood loss anemia.  See below for further details.   Significant events: 7/23>> transferred to [facility] from [facility] for ENT evaluation-severe epistaxis.  Significant studies: 7/23>> CT chest: Multifocal upper lobe PNA-left> right.  Small left pleural effusion.  Multiple lytic lesions.  Significant microbiology data: 7/23>> blood culture: No growth 7/23>> COVID PCR: Negative  Procedures: None  Consults: None  Subjective: Once her nasal packing removed.  No hemoptysis.  Lying comfortably in bed.  Objective: Vitals: Blood pressure (!) 129/59, pulse 90, temperature 97.9 F (36.6 C), temperature source Oral, resp. rate 17, height _0  (1.575 m), weight 60.8 kg, SpO2 96 %.   Exam: Gen Exam:Alert awake-not in any distress HEENT:atraumatic, normocephalic Chest: B/L clear to auscultation anteriorly CVS:S1S2 regular Abdomen:soft non tender, non distended Extremities:no edema Neurology: Non focal Skin: no rash   Pertinent Labs/Radiology:    Latest Ref Rng & Units 10/01/2021    1:41 AM 09/30/2021    7:51 AM 09/29/2021    1:36 AM  CBC  WBC 4.0 - 10.5 K/uL 2.8  2.9  2.6   Hemoglobin 12.0 - 15.0 g/dL 8.5  9.2  8.1   Hematocrit 36.0 - 46.0 % 26.0  27.1  24.6   Platelets 150 - 400 K/uL 43  29  41     Lab Results  Component Value Date   NA 137 09/29/2021   K 3.6 09/29/2021   CL 105 09/29/2021   CO2 23 09/29/2021      Assessment/Plan: Epistaxis: In the setting of thrombocytopenia-packing in place-ENT following.  No further epistaxis for almost since admission.  Discussed with ENT MD-Dr. Rosen-recommends that we continue  to monitor closely given high risk for rebleeding-he intends to remove nasal packing tomorrow.  Pancytopenia due to relapsed IgA multiple myeloma-with bone marrow infiltration-worsening acute blood loss anemia due to epistaxis-worsening thrombocytopenia due to bone marrow involvement/chemotherapy/recent antibiotic use: Hemoglobin has stabilized after 2 units of PRBC transfusion on admission-unfortunately platelet count decreased and required another unit of pheresis platelets on 7/26.  Platelet count relatively stable today.  Recheck CBC tomorrow.   Aspiration pneumonia: In the setting of epistaxis/vomiting-on room air this morning-immunocompromised due to recent chemotherapy/leukopenia-continue IV Unasyn-we will transition to Augmentin on discharge.  We will plan on 7 days of empiric antimicrobial therapy.  Hypothyroidism: Continue Synthroid  PAF: Not a candidate for anticoagulation-maintaining sinus rhythm.  Monitor on telemetry.  Debility/deconditioning: PT/OT appreciated-refusing home health services.  BMI: Estimated body mass index is 24.52 kg/m as calculated from the following:   Height as of this encounter: _1  (1.575 m).   Weight as of this encounter: 60.8 kg.   Code status:   Code Status: DNR (reconfirmed with patient and daughter at bedside on 7/24)  DVT Prophylaxis: SCDs Start: 09/27/21 2120   Family Communication: Daughter-[name] Williams-[phone]-updated over the phone on 7/27.    Disposition Plan: Status is: Inpatient Remains inpatient appropriate because: Severe pancytopenia-epistaxis-aspiration pneumonia-not stable for discharge.   Planned Discharge Destination: Home with hospice.  Diet: Diet Order  Diet Heart Room service appropriate? No; Fluid consistency: Thin  Diet effective now                     Antimicrobial agents: Anti-infectives (From admission, onward)    Start     Dose/Rate Route Frequency Ordered Stop   09/30/21 1445   fluconazole (DIFLUCAN) tablet 100 mg        100 mg Oral Daily 09/30/21 1352 10/07/21 0959   09/28/21 2200  vancomycin (VANCOREADY) IVPB 750 mg/150 mL  Status:  Discontinued        750 mg 150 mL/hr over 60 Minutes Intravenous Every 24 hours 09/27/21 2153 09/28/21 0915   09/28/21 1200  Ampicillin-Sulbactam (UNASYN) 3 g in sodium chloride 0.9 % 100 mL IVPB        3 g 200 mL/hr over 30 Minutes Intravenous Every 8 hours 09/28/21 1014     09/27/21 2230  azithromycin (ZITHROMAX) 500 mg in sodium chloride 0.9 % 250 mL IVPB  Status:  Discontinued        500 mg 250 mL/hr over 60 Minutes Intravenous Every 24 hours 09/27/21 2158 09/28/21 0915   09/27/21 2200  acyclovir (ZOVIRAX) tablet 400 mg        400 mg Oral 2 times daily 09/27/21 2126     09/27/21 2200  vancomycin (VANCOREADY) IVPB 1250 mg/250 mL        1,250 mg 166.7 mL/hr over 90 Minutes Intravenous  Once 09/27/21 2153 09/28/21 0256   09/27/21 2200  piperacillin-tazobactam (ZOSYN) IVPB 3.375 g  Status:  Discontinued        3.375 g 12.5 mL/hr over 240 Minutes Intravenous Every 8 hours 09/27/21 2153 09/27/21 2158   09/27/21 2200  cefTRIAXone (ROCEPHIN) 2 g in sodium chloride 0.9 % 100 mL IVPB  Status:  Discontinued        2 g 200 mL/hr over 30 Minutes Intravenous Every 24 hours 09/27/21 2158 09/28/21 0915   09/27/21 2145  Ampicillin-Sulbactam (UNASYN) 3 g in sodium chloride 0.9 % 100 mL IVPB  Status:  Discontinued        3 g 200 mL/hr over 30 Minutes Intravenous Every 8 hours 09/27/21 2136 09/27/21 2145        MEDICATIONS: Scheduled Meds:  sodium chloride   Intravenous Once   acyclovir  400 mg Oral BID   fluconazole  100 mg Oral Daily   levothyroxine  137 mcg Oral QAC breakfast   Continuous Infusions:  ampicillin-sulbactam (UNASYN) IV 3 g (10/01/21 0642)   PRN Meds:.acetaminophen **OR** acetaminophen, ondansetron (ZOFRAN) IV, oxymetazoline   I have personally reviewed following labs and imaging studies  LABORATORY  DATA: CBC: Recent Labs  Lab 09/27/21 1700 09/28/21 1104 09/29/21 0136 09/30/21 0751 10/01/21 0141  WBC 3.2* 3.5* 2.6* 2.9* 2.8*  HGB 6.2* 9.1* 8.1* 9.2* 8.5*  HCT 19.0* 27.2* 24.6* 27.1* 26.0*  MCV 93.1 88.3 89.5 88.9 88.7  PLT 36* 54* 41* 29* 43*     Basic Metabolic Panel: Recent Labs  Lab 09/27/21 1700 09/28/21 1104 09/29/21 0136  NA 133* 139 137  K 3.3* 4.1 3.6  CL 97* 106 105  CO2 _0 GLUCOSE 134* 118* 93  BUN _1 CREATININE 0.94 0.77 0.81  CALCIUM 7.5* 7.2* 6.8*  MG  --  2.0 2.0     GFR: Estimated Creatinine Clearance: 47.6 mL/min (by C-G formula based on SCr of 0.81 mg/dL).  Liver Function Tests: Recent Labs  Lab 09/27/21 1700  09/29/21 0136  AST 16 20  ALT 15 18  ALKPHOS 100 82  BILITOT 0.9 0.5  PROT 5.3* 5.0*  ALBUMIN 2.3* 2.0*    No results for input(s): "LIPASE", "AMYLASE" in the last 168 hours. No results for input(s): "AMMONIA" in the last 168 hours.  Coagulation Profile: Recent Labs  Lab 09/27/21 1700  INR 1.1     Cardiac Enzymes: No results for input(s): "CKTOTAL", "CKMB", "CKMBINDEX", "TROPONINI" in the last 168 hours.  BNP (last 3 results) No results for input(s): "PROBNP" in the last 8760 hours.  Lipid Profile: No results for input(s): "CHOL", "HDL", "LDLCALC", "TRIG", "CHOLHDL", "LDLDIRECT" in the last 72 hours.  Thyroid Function Tests: No results for input(s): "TSH", "T4TOTAL", "FREET4", "T3FREE", "THYROIDAB" in the last 72 hours.  Anemia Panel: No results for input(s): "VITAMINB12", "FOLATE", "FERRITIN", "TIBC", "IRON", "RETICCTPCT" in the last 72 hours.  Urine analysis:    Component Value Date/Time   COLORURINE YELLOW 05/11/2019 [place] 05/11/2019 0819   LABSPEC 1.021 05/11/2019 0819   PHURINE 5.0 05/11/2019 0819   GLUCOSEU NEGATIVE 05/11/2019 0819   HGBUR MODERATE (A) 05/11/2019 0819   BILIRUBINUR NEGATIVE 05/11/2019 0819   KETONESUR NEGATIVE 05/11/2019 0819   PROTEINUR NEGATIVE  05/11/2019 0819   UROBILINOGEN 0.2 10/02/2008 0830   NITRITE NEGATIVE 05/11/2019 0819   LEUKOCYTESUR NEGATIVE 05/11/2019 0819    Sepsis Labs: Lactic Acid, Venous    Component Value Date/Time   LATICACIDVEN 1.0 09/28/2021 1104    MICROBIOLOGY: Recent Results (from the past 240 hour(s))  Culture, blood (Routine X 2) w Reflex to ID Panel     Status: None   Collection Time: 09/23/21  6:30 AM   Specimen: BLOOD  Result Value Ref Range Status   Specimen Description   Final    BLOOD RIGHT ANTECUBITAL Performed at [facility], [place] [address], [place], [place] [address]    Special Requests   Final    BOTTLES DRAWN AEROBIC AND ANAEROBIC Blood Culture adequate volume Performed at [facility], [place] [address], [place], [place] [address]    Culture   Final    NO GROWTH 5 DAYS Performed at [facility], [address] [place], [place] [address]    Report Status 09/28/2021 FINAL  Final  Culture, blood (Routine X 2) w Reflex to ID Panel     Status: None   Collection Time: 09/23/21 11:04 AM   Specimen: BLOOD  Result Value Ref Range Status   Specimen Description BLOOD RIGHT ANTECUBITAL  Final   Special Requests   Final    BOTTLES DRAWN AEROBIC AND ANAEROBIC Blood Culture adequate volume   Culture   Final    NO GROWTH 5 DAYS Performed at [facility], [address], [place], [place] [address]    Report Status 09/28/2021 FINAL  Final  Blood culture (routine x 2)     Status: None (Preliminary result)   Collection Time: 09/27/21  4:49 PM   Specimen: BLOOD  Result Value Ref Range Status   Specimen Description   Final    BLOOD RIGHT ANTECUBITAL Performed at [facility], [place] [address], [place], [place] [address]    Special Requests   Final    BOTTLES DRAWN AEROBIC AND ANAEROBIC Blood Culture results may not be optimal due to an excessive volume of blood received in culture bottles Performed at [place] [address], [place],  [address]    Culture  Final    NO GROWTH 4 DAYS Performed at [facility], [address], [place], [place] [address]    Report Status PENDING  Incomplete  Blood culture (routine x 2)     Status: None (Preliminary result)   Collection Time: 09/27/21  5:08 PM   Specimen: BLOOD  Result Value Ref Range Status   Specimen Description   Final    BLOOD LEFT ANTECUBITAL Performed at [facility], [place] [address] [place], [place] [address]    Special Requests   Final    BOTTLES DRAWN AEROBIC AND ANAEROBIC Blood Culture results may not be optimal due to an excessive volume of blood received in culture bottles Performed at [place] [address], [place], [place] [address]    Culture   Final    NO GROWTH 4 DAYS Performed at [facility], [address] [place], [place] [address]    Report Status PENDING  Incomplete  SARS Coronavirus 2 by RT PCR (hospital order, performed in [facility] hospital lab) *cepheid single result test* Throat     Status: None   Collection Time: 09/27/21 10:16 PM   Specimen: Throat; Nasal Swab  Result Value Ref Range Status   SARS Coronavirus 2 by RT PCR NEGATIVE NEGATIVE Final    Comment: (NOTE) SARS-CoV-2 target nucleic acids are NOT DETECTED.  The SARS-CoV-2 RNA is generally detectable in upper and lower respiratory specimens during the acute phase of infection. The lowest concentration of SARS-CoV-2 viral copies this assay can detect is 250 copies / mL. A negative result does not preclude SARS-CoV-2 infection and should not be used as the sole basis for treatment or other patient management decisions.  A negative result may occur with improper specimen collection / handling, submission of specimen other than nasopharyngeal swab, presence of viral mutation(s) within the areas targeted by this assay, and inadequate number of viral copies (<250 copies / mL). A negative result must be combined with clinical observations,  patient history, and epidemiological information.  Fact Sheet for Patients:   https://www.patel.info/  Fact Sheet for Healthcare Providers: https://hall.com/  This test is not yet approved or  cleared by the [place] FDA and has been authorized for detection and/or diagnosis of SARS-CoV-2 by FDA under an Emergency Use Authorization (EUA).  This EUA will remain in effect (meaning this test can be used) for the duration of the COVID-19 declaration under Section 564(b)(1) of the Act, 21 U.S.C. section 360bbb-3(b)(1), unless the authorization is terminated or revoked sooner.  Performed at [facility], [place] [address], [place],  [address]     RADIOLOGY STUDIES/RESULTS: No results found.   LOS: 4 days   Oren Binet, MD  [facility]    To contact the attending provider between 7A-7P or the covering provider during after hours 7P-7A, please log into the web site [email or web address] and access using universal [place] password for that web site. If you do not have the password, please call the hospital operator.  10/01/2021, 10:58 AM

## 2021-10-01 NOTE — Progress Notes (Signed)
Pharmacy Antibiotic Note  Courtney Arnold is a 81 y.o. female admitted on 09/27/2021 presenting from [facility] with nose bleed. Patient has history of multiple myeloma and concern for pneumonia. Pharmacy has been consulted for Unasyn dosing.   Plan: Unasyn 3 gm every 8 hours Monitor renal function--BMP ordered for 7/31 Unasyn>>Augmentin upon discharge  Height: _0  (157.5 cm) Weight: 60.8 kg (134 lb 0.6 oz) IBW/kg (Calculated) : 50.1  Temp (24hrs), Avg:97.9 F (36.6 C), Min:97.5 F (36.4 C), Max:98.2 F (36.8 C)  Recent Labs  Lab 09/27/21 1700 09/28/21 1104 09/29/21 0136 09/30/21 0751 10/01/21 0141  WBC 3.2* 3.5* 2.6* 2.9* 2.8*  CREATININE 0.94 0.77 0.81  --   --   LATICACIDVEN 1.3 1.0  --   --   --      Estimated Creatinine Clearance: 47.6 mL/min (by C-G formula based on SCr of 0.81 mg/dL).    No Known Allergies  Antimicrobials this admission: Unasyn 7/24 >>  Azithromycin 7/24 x1 Ceftriaxone 7/23 x1 Vancomycin 7/24 x1  Fluconazole 7/26>>8/2 Acyclovir chronically PTA>>  Microbiology results: 7/19 Bcx: no growth  7/23 BCx: no growth   Thank you for allowing pharmacy to be a part of this patient's care.  Billey Gosling, PharmD PGY1 Pharmacy Resident  7/27/20238:37 AM

## 2021-10-01 NOTE — Progress Notes (Signed)
Mobility Specialist: Progress Note   10/01/21 1559  Mobility  Activity Refused mobility   Pt refused mobility stating she has already worked with therapy today. Explained to pt our goal is to walk multiple times each Corwin Kuiken, despite encouragement pt still refusing. Will f/u as able.   [facility] Deborha Moseley Mobility Specialist Mobility Specialist [address]: [phone]

## 2021-10-01 NOTE — Plan of Care (Signed)
  Problem: Clinical Measurements: Goal: Will remain free from infection Outcome: Progressing Goal: Diagnostic test results will improve Outcome: Progressing   

## 2021-10-01 NOTE — Progress Notes (Signed)
Notified by CCMD that patient's heart rate elevated to 160's. HR returned to 70-80's. Patient asymptomatic.  Dr. Myna Hidalgo notified. Will continue to monitor

## 2021-10-02 ENCOUNTER — Other Ambulatory Visit (HOSPITAL_COMMUNITY): Payer: Self-pay

## 2021-10-02 DIAGNOSIS — D61818 Other pancytopenia: Secondary | ICD-10-CM | POA: Diagnosis not present

## 2021-10-02 DIAGNOSIS — D649 Anemia, unspecified: Secondary | ICD-10-CM

## 2021-10-02 DIAGNOSIS — J189 Pneumonia, unspecified organism: Secondary | ICD-10-CM | POA: Diagnosis not present

## 2021-10-02 DIAGNOSIS — D62 Acute posthemorrhagic anemia: Secondary | ICD-10-CM | POA: Diagnosis not present

## 2021-10-02 DIAGNOSIS — R040 Epistaxis: Secondary | ICD-10-CM | POA: Diagnosis not present

## 2021-10-02 LAB — CBC
HCT: 24.5 % — ABNORMAL LOW (ref 36.0–46.0)
Hemoglobin: 8.1 g/dL — ABNORMAL LOW (ref 12.0–15.0)
MCH: 29.3 pg (ref 26.0–34.0)
MCHC: 33.1 g/dL (ref 30.0–36.0)
MCV: 88.8 fL (ref 80.0–100.0)
Platelets: 35 10*3/uL — ABNORMAL LOW (ref 150–400)
RBC: 2.76 MIL/uL — ABNORMAL LOW (ref 3.87–5.11)
RDW: 15 % (ref 11.5–15.5)
WBC: 2.2 10*3/uL — ABNORMAL LOW (ref 4.0–10.5)
nRBC: 0 % (ref 0.0–0.2)

## 2021-10-02 LAB — CULTURE, BLOOD (ROUTINE X 2)
Culture: NO GROWTH
Culture: NO GROWTH

## 2021-10-02 MED ORDER — FLUCONAZOLE 100 MG PO TABS: 100.0000 mg | ORAL_TABLET | Freq: Every day | ORAL | 0 refills | Status: AC

## 2021-10-02 MED ORDER — AMOXICILLIN-POT CLAVULANATE 875-125 MG PO TABS: 1.0000 | ORAL_TABLET | Freq: Two times a day (BID) | ORAL | 0 refills | Status: AC

## 2021-10-02 NOTE — Discharge Summary (Signed)
PATIENT DETAILS Name: Courtney Arnold Age: 81 y.o. Sex: female Date of Birth: [birth year] MRN: [id number]. Admitting Physician: Shela Leff, MD ASN:[id number], [name], MD  Admit Date: 09/27/2021 Discharge date: 10/02/2021  Recommendations for Outpatient Follow-up:  Follow up with PCP in 1-2 weeks Please obtain CMP/CBC in one week Please follow-up with oncologist-Dr. Delton Coombes this coming Monday for repeat CBC.  Admitted From:  Home  Disposition: Home   Discharge Condition: fair  CODE STATUS:   Code Status: DNR   Diet recommendation:  Diet Order             Diet - low sodium heart healthy           Diet Heart Room service appropriate? No; Fluid consistency: Thin  Diet effective now                    Brief Summary: Patient is a 81 y.o.  female with history of multiple myeloma-pancytopenia-who presented with severe epistaxis and acute blood loss anemia.  See below for further details.    Significant events: 7/23>> transferred to [facility] from [facility] for ENT evaluation-severe epistaxis.   Significant studies: 7/23>> CT chest: Multifocal upper lobe PNA-left> right.  Small left pleural effusion.  Multiple lytic lesions.   Significant microbiology data: 7/23>> blood culture: No growth 7/23>> COVID PCR: Negative   Procedures: None   Consults: None  Brief Hospital Course: Epistaxis: In the setting of thrombocytopenia-nasal packing was placed in the emergency room-followed by ENT closely-packing removed on 7/28-no further bleeding.  Discussed with Dr. Gerre Couch to discharge.  Unfortunately-continues to have some amount of thrombocytopenia due to bone marrow involvement from multiple myeloma-discussed with her oncologist Dr. Delton Coombes today-okay to discharge-he will ensure follow-up at his office for repeat CBC on 7/31.     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  See above regarding plans to recheck CBC on 7/31 at oncologist office.  Both patient/family aware of difficult situation.   Aspiration pneumonia: In the setting of epistaxis/vomiting-treated with IV Unasyn-we will transition to Augmentin to complete a 7-day course of antimicrobial therapy given immunocompromise state.  Oral thrush: Continue fluconazole-x1 week total.  Hypothyroidism: Continue Synthroid   PAF: Not a candidate for anticoagulation-maintaining sinus rhythm.  Monitor on telemetry.   Debility/deconditioning: PT/OT appreciated-refusing home health services.   BMI: Estimated body mass index is 24.52 kg/m as calculated from the following:   Height as of this encounter: 5' 2"  (1.575 m).   Weight as of this encounter: 60.8 kg.    Discharge Diagnoses:  Principal Problem:   Epistaxis Active Problems:   Hypokalemia   Hypothyroidism   CAP (community acquired pneumonia)   Thrombocytopenia ([place])   Acute blood loss anemia   Sepsis ([place])   Discharge Instructions:  Activity:  As tolerated  Discharge Instructions     Call MD for:  difficulty breathing, headache or visual disturbances   Complete by: As directed    Diet - low sodium heart healthy   Complete by: As directed    Discharge instructions   Complete by: As directed    Follow with Primary MD  Courtney Sites, MD in 1-2 weeks  Follow-up with the oncologist office on 7/31 for repeat CBC.  If you have recurrent epistaxis-please  seek immediate medical attention.  Please get a complete blood count and chemistry panel checked by your Primary MD at your next visit, and again as instructed by your Primary MD.  Get Medicines reviewed and adjusted: Please take all your  medications with you for your next visit with your Primary MD  Laboratory/radiological data: Please request your Primary MD to go over all hospital tests and procedure/radiological results at the follow up, please ask your Primary MD to get all Hospital records sent to his/her office.  In some cases, they will be blood work, cultures and biopsy results pending at the time of your discharge. Please request that your primary care M.D. follows up on these results.  Also Note the following: If you experience worsening of your admission symptoms, develop shortness of breath, life threatening emergency, suicidal or homicidal thoughts you must seek medical attention immediately by calling 911 or calling your MD immediately  if symptoms less severe.  You must read complete instructions/literature along with all the possible adverse reactions/side effects for all the Medicines you take and that have been prescribed to you. Take any new Medicines after you have completely understood and accpet all the possible adverse reactions/side effects.   Do not drive when taking Pain medications or sleeping medications (Benzodaizepines)  Do not take more than prescribed Pain, Sleep and Anxiety Medications. It is not advisable to combine anxiety,sleep and pain medications without talking with your primary care practitioner  Special Instructions: If you have smoked or chewed Tobacco  in the last 2 yrs please stop smoking, stop any regular Alcohol  and or any Recreational drug use.  Wear Seat belts while driving.  Please note: You were cared for by a hospitalist during your hospital stay. Once you are discharged, your primary care physician will handle any further medical issues. Please note that NO REFILLS for any discharge medications will be authorized once you are discharged, as it is imperative that you return to your primary care physician (or establish a relationship with a primary care physician if you do not  have one) for your post hospital discharge needs so that they can reassess your need for medications and monitor your lab values.   Increase activity slowly   Complete by: As directed       Allergies as of 10/02/2021   No Known Allergies      Medication List     STOP taking these medications    dexamethasone 4 MG tablet Commonly known as: DECADRON   doxycycline 100 MG capsule Commonly known as: VIBRAMYCIN       TAKE these medications    acyclovir 400 MG tablet Commonly known as: ZOVIRAX TAKE (1) TABLET BY MOUTH TWICE DAILY. What changed: See the new instructions.   amoxicillin-clavulanate 875-125 MG tablet Commonly known as: AUGMENTIN Take 1 tablet by mouth 2 (two) times daily for 2 days.   B-12 PO Take 1 capsule by mouth daily.   CALCIUM PO Take 1 capsule by mouth daily.   carboxymethylcellulose 0.5 % Soln Commonly known as: REFRESH PLUS Place 1 drop into both eyes in the morning, at noon, in the evening, and at bedtime.   feeding supplement Liqd Take 237 mLs by mouth 2 (two) times daily between meals.   fluconazole 100 MG tablet Commonly known as: DIFLUCAN Take 1 tablet (100 mg total) by mouth daily for 4 days.   IRON PO Take 1 capsule by mouth daily.   levothyroxine 137 MCG tablet Commonly known as: SYNTHROID  Take 1 tablet (137 mcg total) by mouth daily before breakfast.   multivitamin with minerals Tabs tablet Take 1 tablet by mouth daily.   ondansetron 8 MG tablet Commonly known as: Zofran Take 1 tablet (8 mg total) by mouth every 8 (eight) hours as needed for nausea or vomiting.   POTASSIUM PO Take 1 capsule by mouth daily.   SSD 1 % cream Generic drug: silver sulfADIAZINE Apply 1 Application topically daily.   Vitamin D 125 MCG (5000 UT) Caps Take 5,000 Units by mouth daily.        Follow-up Information     Izora Gala, MD Follow up.   Specialty: Otolaryngology Why: As needed Contact information: [address] [place] [address] [phone]         Courtney Sites, MD Follow up in 1 week(s).   Specialty: Family Medicine Contact information: [address] [place] [address] [phone]         Derek Jack, MD Follow up on 10/05/2021.   Specialty: Hematology Why: Repeat Complete Blood Count Contact information: [place] [address] [phone]                No Known Allergies   Other Procedures/Studies: CT CHEST WO CONTRAST  Result Date: 09/27/2021 CLINICAL DATA:  Shortness of breath, tachypnea, recent pneumonia EXAM: CT CHEST WITHOUT CONTRAST TECHNIQUE: Multidetector CT imaging of the chest was performed following the standard protocol without IV contrast. RADIATION DOSE REDUCTION: This exam was performed according to the departmental dose-optimization program which includes automated exposure control, adjustment of the mA and/or kV according to patient size and/or use of iterative reconstruction technique. COMPARISON:  Chest radiograph dated 09/27/2021 and 09/22/2021. FINDINGS: Cardiovascular: The heart is normal in size. No pericardial effusion. No evidence of thoracic aortic aneurysm. Atherosclerotic calcifications of the aortic arch. Three vessel coronary atherosclerosis. Mediastinum/Nodes: Small mediastinal lymph nodes as a 90 pathologic CT size criteria. Visualized thyroid is unremarkable. Lungs/Pleura: Consolidative patchy opacity in the left lung apex/left upper lobe with associated air bronchograms in the medial left upper lobe/lingula. Additional mild patchy opacity in the posterior right upper lobe. When coupled with the rapid progression on chest radiographs, this appearance is compatible with multifocal pneumonia. Small left pleural effusion. No pneumothorax. Upper Abdomen: Visualized upper abdomen is notable for mild vascular calcifications and a 3.2 cm anterior left upper pole renal cyst. Musculoskeletal: Multiple lytic lesions,  including in the medial clavicles, sternum, and multiple thoracic vertebral bodies, compatible with the patient's known multiple myeloma. Prior vertebral augmentation at T5, T6, and T11. Severe compression fracture deformity at T9. IMPRESSION: Multifocal upper lobe pneumonia, left greater than right. Associated small left pleural effusion. Consider follow-up chest radiographs in 4-6 weeks to document clearance. Multiple lytic lesions, compatible with the patient's known multiple myeloma. Aortic Atherosclerosis (ICD10-I70.0). Electronically Signed   By: Julian Hy M.D.   On: 09/27/2021 23:34   DG Chest Port 1 View  Result Date: 09/27/2021 CLINICAL DATA:  Nosebleed EXAM: PORTABLE CHEST 1 VIEW COMPARISON:  09/22/2021 FINDINGS: The heart size and mediastinal contours are within normal limits. Significant interval increase in heterogeneous and consolidative airspace opacity of the left apex. The visualized skeletal structures are unremarkable. IMPRESSION: Significant interval increase in heterogeneous and consolidative airspace opacity of the left apex, concerning for infection. Consider CT to further evaluate. Electronically Signed   By: Delanna Ahmadi M.D.   On: 09/27/2021 17:57   DG Chest Port 1 View  Result Date:  09/22/2021 CLINICAL DATA:  Abdominal pain, multiple myeloma EXAM: PORTABLE CHEST 1 VIEW COMPARISON:  05/10/2018 chest radiograph. FINDINGS: Stable cardiomediastinal silhouette with normal heart size. No pneumothorax. No pleural effusion. Streaky upper left lung opacities are new. Vertebroplasty material overlies mid and lower thoracic vertebral bodies, unchanged. Healed posterolateral left sixth rib fracture. IMPRESSION: Streaky upper left lung opacities are new and could represent pneumonia and/or atelectasis. Follow-up chest radiographs advised. Electronically Signed   By: Ilona Sorrel M.D.   On: 09/22/2021 12:53   CT ABDOMEN PELVIS W CONTRAST  Result Date: 09/22/2021 CLINICAL DATA:   Abdominal pain, acute, nonlocalized EXAM: CT ABDOMEN AND PELVIS WITH CONTRAST TECHNIQUE: Multidetector CT imaging of the abdomen and pelvis was performed using the standard protocol following bolus administration of intravenous contrast. RADIATION DOSE REDUCTION: This exam was performed according to the departmental dose-optimization program which includes automated exposure control, adjustment of the mA and/or kV according to patient size and/or use of iterative reconstruction technique. CONTRAST:  45m OMNIPAQUE IOHEXOL 300 MG/ML  SOLN COMPARISON:  March 2021 FINDINGS: Lower chest: No acute abnormality. Hepatobiliary: No focal liver abnormality is seen. Status post cholecystectomy. No unexpected biliary dilatation. Pancreas: Unremarkable. Spleen: Unremarkable. Adrenals/Urinary Tract: Adrenals are unremarkable. Bilateral renal cysts. Bladder is unremarkable. Stomach/Bowel: Stomach is within normal limits. A small hiatal hernia is present bowel is normal in caliber. Normal appendix. Sigmoid diverticulosis. Vascular/Lymphatic: Atherosclerosis.  No enlarged nodes. Reproductive: Uterus and bilateral adnexa are unremarkable. Other: No free fluid. Musculoskeletal: Right femoral intramedullary rod is partially imaged. Chronic treated T12 compression fracture. Chronic L4 compression fracture. No new destructive osseous lesion. IMPRESSION: No acute abnormality. Sigmoid diverticulosis.  Small hiatal hernia. Electronically Signed   By: PMacy MisM.D.   On: 09/22/2021 12:30     TODAY-DAY OF DISCHARGE:  Subjective:   Courtney Highlandtoday has no headache,no chest abdominal pain,no new weakness tingling or numbness, feels much better wants to go home today.   Objective:   Blood pressure 122/63, pulse 78, temperature 98.3 F (36.8 C), temperature source Oral, resp. rate 17, height 5' 2"  (1.575 m), weight 60.8 kg, SpO2 98 %.  Intake/Output Summary (Last 24 hours) at 10/02/2021 1117 Last data filed at 10/02/2021  0658 Gross per 24 hour  Intake 314.8 ml  Output --  Net 314.8 ml   Filed Weights   09/27/21 1618 09/27/21 1859  Weight: 60.8 kg 60.8 kg    Exam: Awake Alert, Oriented *3, No new F.N deficits, Normal affect [place].AT,PERRAL Supple Neck,No JVD, No cervical lymphadenopathy appriciated.  Symmetrical Chest wall movement, Good air movement bilaterally, CTAB RRR,No Gallops,Rubs or new Murmurs, No Parasternal Heave +ve B.Sounds, Abd Soft, Non tender, No organomegaly appriciated, No rebound -guarding or rigidity. No Cyanosis, Clubbing or edema, No new Rash or bruise   PERTINENT RADIOLOGIC STUDIES: No results found.   PERTINENT LAB RESULTS: CBC: Recent Labs    10/01/21 0141 10/02/21 0124  WBC 2.8* 2.2*  HGB 8.5* 8.1*  HCT 26.0* 24.5*  PLT 43* 35*   CMET CMP     Component Value Date/Time   NA 137 09/29/2021 0136   K 3.6 09/29/2021 0136   CL 105 09/29/2021 0136   CO2 23 09/29/2021 0136   GLUCOSE 93 09/29/2021 0136   BUN 14 09/29/2021 0136   CREATININE 0.81 09/29/2021 0136   CALCIUM 6.8 (L) 09/29/2021 0136   PROT 5.0 (L) 09/29/2021 0136   ALBUMIN 2.0 (L) 09/29/2021 0136   AST 20 09/29/2021 0136   ALT 18 09/29/2021 0136  ALKPHOS 82 09/29/2021 0136   BILITOT 0.5 09/29/2021 0136   GFRNONAA >60 09/29/2021 0136   GFRAA >60 12/06/2019 1347    GFR Estimated Creatinine Clearance: 47.6 mL/min (by C-G formula based on SCr of 0.81 mg/dL). No results for input(s): "LIPASE", "AMYLASE" in the last 72 hours. No results for input(s): "CKTOTAL", "CKMB", "CKMBINDEX", "TROPONINI" in the last 72 hours. Invalid input(s): "POCBNP" No results for input(s): "DDIMER" in the last 72 hours. No results for input(s): "HGBA1C" in the last 72 hours. No results for input(s): "CHOL", "HDL", "LDLCALC", "TRIG", "CHOLHDL", "LDLDIRECT" in the last 72 hours. No results for input(s): "TSH", "T4TOTAL", "T3FREE", "THYROIDAB" in the last 72 hours.  Invalid input(s): "FREET3" No results for input(s):  "VITAMINB12", "FOLATE", "FERRITIN", "TIBC", "IRON", "RETICCTPCT" in the last 72 hours. Coags: No results for input(s): "INR" in the last 72 hours.  Invalid input(s): "PT" Microbiology: Recent Results (from the past 240 hour(s))  Culture, blood (Routine X 2) w Reflex to ID Panel     Status: None   Collection Time: 09/23/21  6:30 AM   Specimen: BLOOD  Result Value Ref Range Status   Specimen Description   Final    BLOOD RIGHT ANTECUBITAL Performed at [facility], [place] [address], [place], [place] [address]    Special Requests   Final    BOTTLES DRAWN AEROBIC AND ANAEROBIC Blood Culture adequate volume Performed at [facility], [place] [address], [place], [place] [address]    Culture   Final    NO GROWTH 5 DAYS Performed at [facility], [address], [place], [place] [address]    Report Status 09/28/2021 FINAL  Final  Culture, blood (Routine X 2) w Reflex to ID Panel     Status: None   Collection Time: 09/23/21 11:04 AM   Specimen: BLOOD  Result Value Ref Range Status   Specimen Description BLOOD RIGHT ANTECUBITAL  Final   Special Requests   Final    BOTTLES DRAWN AEROBIC AND ANAEROBIC Blood Culture adequate volume   Culture   Final    NO GROWTH 5 DAYS Performed at [facility], [address] [place], [place] [address]    Report Status 09/28/2021 FINAL  Final  Blood culture (routine x 2)     Status: None   Collection Time: 09/27/21  4:49 PM   Specimen: BLOOD  Result Value Ref Range Status   Specimen Description   Final    BLOOD RIGHT ANTECUBITAL Performed at [facility], [place] [address] [place], [place] [address]    Special Requests   Final    BOTTLES DRAWN AEROBIC AND ANAEROBIC Blood Culture results may not be optimal due to an excessive volume of blood received in culture bottles Performed at [place] [address] [place], [place] [address]    Culture   Final    NO GROWTH 5 DAYS Performed at [facility], [address] [place],  [address]    Report Status 10/02/2021 FINAL  Final  Blood culture (routine x 2)     Status: None   Collection Time: 09/27/21  5:08 PM   Specimen: BLOOD  Result Value Ref Range Status   Specimen Description   Final    BLOOD LEFT ANTECUBITAL Performed at [facility], [place] [address], [place],  [address]    Special Requests   Final    BOTTLES DRAWN AEROBIC AND ANAEROBIC Blood Culture results may not be optimal  due to an excessive volume of blood received in culture bottles Performed at [place] [address] [place], [place] [address]    Culture   Final    NO GROWTH 5 DAYS Performed at [facility], [address], [place], [place] [address]    Report Status 10/02/2021 FINAL  Final  SARS Coronavirus 2 by RT PCR (hospital order, performed [facility]) *cepheid single result test* Throat     Status: None   Collection Time: 09/27/21 10:16 PM   Specimen: Throat; Nasal Swab  Result Value Ref Range Status   SARS Coronavirus 2 by RT PCR NEGATIVE NEGATIVE Final    Comment: (NOTE) SARS-CoV-2 target nucleic acids are NOT DETECTED.  The SARS-CoV-2 RNA is generally detectable in upper and lower respiratory specimens during the acute phase of infection. The lowest concentration of SARS-CoV-2 viral copies this assay can detect is 250 copies / mL. A negative result does not preclude SARS-CoV-2 infection and should not be used as the sole basis for treatment or other patient management decisions.  A negative result may occur with improper specimen collection / handling, submission of specimen other than nasopharyngeal swab, presence of viral mutation(s) within the areas targeted by this assay, and inadequate number of viral copies (<250 copies / mL). A negative result must be combined with clinical observations, patient history, and epidemiological information.  Fact Sheet for Patients:    https://www.patel.info/  Fact Sheet for Healthcare Providers: https://hall.com/  This test is not yet approved or  cleared by the [place] FDA and has been authorized for detection and/or diagnosis of SARS-CoV-2 by FDA under an Emergency Use Authorization (EUA).  This EUA will remain in effect (meaning this test can be used) for the duration of the COVID-19 declaration under Section 564(b)(1) of the Act, 21 U.S.C. section 360bbb-3(b)(1), unless the authorization is terminated or revoked sooner.  Performed at [facility], [place] [address] [place], [place] [address]     FURTHER DISCHARGE INSTRUCTIONS:  Get Medicines reviewed and adjusted: Please take all your medications with you for your next visit with your Primary MD  Laboratory/radiological data: Please request your Primary MD to go over all hospital tests and procedure/radiological results at the follow up, please ask your Primary MD to get all Hospital records sent to his/her office.  In some cases, they will be blood work, cultures and biopsy results pending at the time of your discharge. Please request that your primary care M.D. goes through all the records of your hospital data and follows up on these results.  Also Note the following: If you experience worsening of your admission symptoms, develop shortness of breath, life threatening emergency, suicidal or homicidal thoughts you must seek medical attention immediately by calling 911 or calling your MD immediately  if symptoms less severe.  You must read complete instructions/literature along with all the possible adverse reactions/side effects for all the Medicines you take and that have been prescribed to you. Take any new Medicines after you have completely understood and accpet all the possible adverse reactions/side effects.   Do not drive when taking Pain medications or sleeping medications (Benzodaizepines)  Do  not take more than prescribed Pain, Sleep and Anxiety Medications. It is not advisable to combine anxiety,sleep and pain medications without talking with your primary care practitioner  Special Instructions: If you have smoked or chewed Tobacco  in the last 2 yrs please stop smoking, stop any regular Alcohol  and  or any Recreational drug use.  Wear Seat belts while driving.  Please note: You were cared for by a hospitalist during your hospital stay. Once you are discharged, your primary care physician will handle any further medical issues. Please note that NO REFILLS for any discharge medications will be authorized once you are discharged, as it is imperative that you return to your primary care physician (or establish a relationship with a primary care physician if you do not have one) for your post hospital discharge needs so that they can reassess your need for medications and monitor your lab values.  Total Time spent coordinating discharge including counseling, education and face to face time equals greater than 30 minutes.  SignedOren Binet 10/02/2021 11:17 AM

## 2021-10-02 NOTE — Discharge Instructions (Signed)
Use nasal saline spray 5-10 times daily.

## 2021-10-02 NOTE — Progress Notes (Signed)
ENT follow-up, no bleeding.  Packing removed easily.  Still no bleeding.  Start using saline spray several times daily.  Follow-up as needed.

## 2021-10-02 NOTE — Care Management Important Message (Signed)
Important Message  Patient Details  Name: Courtney Arnold MRN: [id number] Date of Birth: 1940/04/10   Medicare Important Message Given:  Yes     Shelda Altes 10/02/2021, 9:23 AM

## 2021-10-02 NOTE — Progress Notes (Signed)
Discharge instructions (including medications) discussed with and copy provided to patient/caregiver 

## 2021-10-05 ENCOUNTER — Other Ambulatory Visit (HOSPITAL_COMMUNITY): Payer: Self-pay | Admitting: *Deleted

## 2021-10-05 ENCOUNTER — Inpatient Hospital Stay (HOSPITAL_COMMUNITY): Payer: Medicare Other

## 2021-10-05 ENCOUNTER — Encounter (HOSPITAL_COMMUNITY): Payer: Self-pay

## 2021-10-05 ENCOUNTER — Other Ambulatory Visit (HOSPITAL_COMMUNITY): Payer: Self-pay

## 2021-10-05 DIAGNOSIS — C9000 Multiple myeloma not having achieved remission: Secondary | ICD-10-CM

## 2021-10-05 DIAGNOSIS — D649 Anemia, unspecified: Secondary | ICD-10-CM

## 2021-10-05 DIAGNOSIS — E876 Hypokalemia: Secondary | ICD-10-CM

## 2021-10-05 DIAGNOSIS — Z5112 Encounter for antineoplastic immunotherapy: Secondary | ICD-10-CM | POA: Diagnosis not present

## 2021-10-05 DIAGNOSIS — C9002 Multiple myeloma in relapse: Secondary | ICD-10-CM | POA: Diagnosis not present

## 2021-10-05 DIAGNOSIS — Z79899 Other long term (current) drug therapy: Secondary | ICD-10-CM | POA: Diagnosis not present

## 2021-10-05 LAB — COMPREHENSIVE METABOLIC PANEL
ALT: 21 U/L (ref 0–44)
AST: 21 U/L (ref 15–41)
Albumin: 2.9 g/dL — ABNORMAL LOW (ref 3.5–5.0)
Alkaline Phosphatase: 72 U/L (ref 38–126)
Anion gap: 9 (ref 5–15)
BUN: 8 mg/dL (ref 8–23)
CO2: 29 mmol/L (ref 22–32)
Calcium: 8.1 mg/dL — ABNORMAL LOW (ref 8.9–10.3)
Chloride: 98 mmol/L (ref 98–111)
Creatinine, Ser: 0.7 mg/dL (ref 0.44–1.00)
GFR, Estimated: >60 mL/min (ref >60)
Glucose, Bld: 105 mg/dL — ABNORMAL HIGH (ref 70–99)
Potassium: 2.7 mmol/L — CL (ref 3.5–5.1)
Sodium: 136 mmol/L (ref 135–145)
Total Bilirubin: 0.8 mg/dL (ref 0.3–1.2)
Total Protein: 6.1 g/dL — ABNORMAL LOW (ref 6.5–8.1)

## 2021-10-05 LAB — CBC
HCT: 25.4 % — ABNORMAL LOW (ref 36.0–46.0)
Hemoglobin: 8.3 g/dL — ABNORMAL LOW (ref 12.0–15.0)
MCH: 29.9 pg (ref 26.0–34.0)
MCHC: 32.7 g/dL (ref 30.0–36.0)
MCV: 91.4 fL (ref 80.0–100.0)
Platelets: 22 10*3/uL — CL (ref 150–400)
RBC: 2.78 MIL/uL — ABNORMAL LOW (ref 3.87–5.11)
RDW: 15.2 % (ref 11.5–15.5)
WBC: 2 10*3/uL — ABNORMAL LOW (ref 4.0–10.5)
nRBC: 0 % (ref 0.0–0.2)

## 2021-10-05 LAB — SAMPLE TO BLOOD BANK

## 2021-10-05 LAB — MAGNESIUM: Magnesium: 2 mg/dL (ref 1.7–2.4)

## 2021-10-05 MED ORDER — POTASSIUM CHLORIDE CRYS ER 20 MEQ PO TBCR: 20.0000 meq | EXTENDED_RELEASE_TABLET | ORAL | 1 refills | Status: DC | PRN

## 2021-10-05 NOTE — Telephone Encounter (Signed)
CRITICAL VALUE STICKER  CRITICAL VALUE:Platelets 22  RECEIVER (on-site recipient of call):Charlyne Petrin, RN  DATE & TIME NOTIFIED: 10/05/2021 0901  MESSENGER (representative from lab):Trena Mcclain at [facility]  MD NOTIFIED: Delton Coombes  TIME OF NOTIFICATION:0902  RESPONSE: Transfuse one unit of platelets.  Orders entered.    Called and spoke with the daughter for lab appointment and lab results for Tuesday's appointment.  Verbalized understanding.

## 2021-10-05 NOTE — Progress Notes (Signed)
CRITICAL VALUE STICKER  CRITICAL VALUE: Potassium 2.7  RECEIVER (on-site recipient of call): Charlyne Petrin, Rn  [place] NOTIFIED: 10/05/2021 at [place] (representative from lab):C. Carrolyn Leigh   MD NOTIFIED: Delton Coombes  TIME OF NOTIFICATION:10/05/2021 at [phone]  RESPONSE:  Instruct the patient to take Potassium 40 meq by mouth, wait three hours then take Potassium 20 meq by mouth and wait three hours and take Potassium 40 meq by mouth to equal 100 meq total.  Repeat CMET before oncology follow up tomorrow verbal order Dr. Delton Coombes.    Called the daughter and spoke with her for potassium instructions with understanding verbalized.  Prescription sent to [facility].

## 2021-10-05 NOTE — Progress Notes (Signed)
CRITICAL VALUE STICKER  CRITICAL VALUE: Platelets 22  RECEIVER (on-site recipient of call): Charlyne Petrin, RN  DATE & TIME NOTIFIED: 10/05/2021 at [place] (representative from lab):Trena Mcclain at [place]  MD NOTIFIED: Delton Coombes   TIME OF NOTIFICATION:0902  RESPONSE: Transfuse one unit of platelets.  Family aware.  Lab notified. Orders entered.

## 2021-10-05 NOTE — Progress Notes (Signed)
cri 

## 2021-10-06 ENCOUNTER — Inpatient Hospital Stay: Payer: Medicare Other | Attending: Hematology

## 2021-10-06 ENCOUNTER — Inpatient Hospital Stay: Payer: Medicare Other

## 2021-10-06 ENCOUNTER — Other Ambulatory Visit: Payer: Self-pay

## 2021-10-06 ENCOUNTER — Other Ambulatory Visit: Payer: Self-pay | Admitting: *Deleted

## 2021-10-06 ENCOUNTER — Inpatient Hospital Stay (HOSPITAL_COMMUNITY): Payer: Medicare Other

## 2021-10-06 ENCOUNTER — Inpatient Hospital Stay (HOSPITAL_BASED_OUTPATIENT_CLINIC_OR_DEPARTMENT_OTHER): Payer: Medicare Other | Admitting: Hematology

## 2021-10-06 VITALS — BP 129/55 | HR 77 | Temp 97.7°F | Resp 18

## 2021-10-06 DIAGNOSIS — C9000 Multiple myeloma not having achieved remission: Secondary | ICD-10-CM

## 2021-10-06 DIAGNOSIS — C9002 Multiple myeloma in relapse: Secondary | ICD-10-CM | POA: Diagnosis not present

## 2021-10-06 DIAGNOSIS — Z79899 Other long term (current) drug therapy: Secondary | ICD-10-CM | POA: Insufficient documentation

## 2021-10-06 DIAGNOSIS — Z5112 Encounter for antineoplastic immunotherapy: Secondary | ICD-10-CM | POA: Insufficient documentation

## 2021-10-06 DIAGNOSIS — D649 Anemia, unspecified: Secondary | ICD-10-CM

## 2021-10-06 LAB — CBC WITH DIFFERENTIAL/PLATELET
Abs Immature Granulocytes: 0.01 10*3/uL (ref 0.00–0.07)
Basophils Absolute: 0 10*3/uL (ref 0.0–0.1)
Basophils Relative: 1 %
Eosinophils Absolute: 0 10*3/uL (ref 0.0–0.5)
Eosinophils Relative: 1 %
HCT: 23.8 % — ABNORMAL LOW (ref 36.0–46.0)
Hemoglobin: 7.7 g/dL — ABNORMAL LOW (ref 12.0–15.0)
Immature Granulocytes: 1 %
Lymphocytes Relative: 43 %
Lymphs Abs: 0.9 10*3/uL (ref 0.7–4.0)
MCH: 30.2 pg (ref 26.0–34.0)
MCHC: 32.4 g/dL (ref 30.0–36.0)
MCV: 93.3 fL (ref 80.0–100.0)
Monocytes Absolute: 0.3 10*3/uL (ref 0.1–1.0)
Monocytes Relative: 15 %
Neutro Abs: 0.8 10*3/uL — ABNORMAL LOW (ref 1.7–7.7)
Neutrophils Relative %: 39 %
Platelets: 21 10*3/uL — CL (ref 150–400)
RBC: 2.55 MIL/uL — ABNORMAL LOW (ref 3.87–5.11)
RDW: 15.3 % (ref 11.5–15.5)
WBC: 2 10*3/uL — ABNORMAL LOW (ref 4.0–10.5)
nRBC: 0 % (ref 0.0–0.2)

## 2021-10-06 LAB — COMPREHENSIVE METABOLIC PANEL
ALT: 20 U/L (ref 0–44)
AST: 20 U/L (ref 15–41)
Albumin: 2.8 g/dL — ABNORMAL LOW (ref 3.5–5.0)
Alkaline Phosphatase: 72 U/L (ref 38–126)
Anion gap: 7 (ref 5–15)
BUN: 7 mg/dL — ABNORMAL LOW (ref 8–23)
CO2: 29 mmol/L (ref 22–32)
Calcium: 8.4 mg/dL — ABNORMAL LOW (ref 8.9–10.3)
Chloride: 100 mmol/L (ref 98–111)
Creatinine, Ser: 0.68 mg/dL (ref 0.44–1.00)
GFR, Estimated: >60 mL/min (ref >60)
Glucose, Bld: 112 mg/dL — ABNORMAL HIGH (ref 70–99)
Potassium: 4 mmol/L (ref 3.5–5.1)
Sodium: 136 mmol/L (ref 135–145)
Total Bilirubin: 0.8 mg/dL (ref 0.3–1.2)
Total Protein: 6 g/dL — ABNORMAL LOW (ref 6.5–8.1)

## 2021-10-06 LAB — MAGNESIUM: Magnesium: 2.1 mg/dL (ref 1.7–2.4)

## 2021-10-06 MED ORDER — SODIUM CHLORIDE 0.9 % IV SOLN: INTRAVENOUS | Status: DC

## 2021-10-06 MED ORDER — DIPHENHYDRAMINE HCL 25 MG PO CAPS: 25.0000 mg | ORAL_CAPSULE | Freq: Once | ORAL | Status: DC

## 2021-10-06 MED ORDER — ACETAMINOPHEN 325 MG PO TABS
650.0000 mg | ORAL_TABLET | Freq: Once | ORAL | Status: AC
  Administered 2021-10-06: 650 mg via ORAL
  Filled 2021-10-06: qty 2

## 2021-10-06 MED ORDER — DIPHENHYDRAMINE HCL 25 MG PO CAPS
25.0000 mg | ORAL_CAPSULE | Freq: Once | ORAL | Status: AC
  Administered 2021-10-06: 25 mg via ORAL
  Filled 2021-10-06: qty 1

## 2021-10-06 MED ORDER — SODIUM CHLORIDE 0.9% IV SOLUTION: 250.0000 mL | Freq: Once | INTRAVENOUS | Status: AC

## 2021-10-06 MED ORDER — ACETAMINOPHEN 325 MG PO TABS: 650.0000 mg | ORAL_TABLET | Freq: Once | ORAL | Status: DC

## 2021-10-06 NOTE — Patient Instructions (Signed)
[facility]  Discharge Instructions: Thank you for choosing [facility] to provide your oncology and hematology care.  If you have a lab appointment with the [facility], please come in thru the Main Entrance and check in at the main information desk.  Wear comfortable clothing and clothing appropriate for easy access to any Portacath or PICC line.   We strive to give you quality time with your provider. You may need to reschedule your appointment if you arrive late (15 or more minutes).  Arriving late affects you and other patients whose appointments are after yours.  Also, if you miss three or more appointments without notifying the office, you may be dismissed from the clinic at the provider's discretion.      For prescription refill requests, have your pharmacy contact our office and allow 72 hours for refills to be completed.    Today you received the following chemotherapy and/or immunotherapy agents 1 unit platelets, 1UPRBC      To help prevent nausea and vomiting after your treatment, we encourage you to take your nausea medication as directed.  BELOW ARE SYMPTOMS THAT SHOULD BE REPORTED IMMEDIATELY: *FEVER GREATER THAN 100.4 F (38 C) OR HIGHER *CHILLS OR SWEATING *NAUSEA AND VOMITING THAT IS NOT CONTROLLED WITH YOUR NAUSEA MEDICATION *UNUSUAL SHORTNESS OF BREATH *UNUSUAL BRUISING OR BLEEDING *URINARY PROBLEMS (pain or burning when urinating, or frequent urination) *BOWEL PROBLEMS (unusual diarrhea, constipation, pain near the anus) TENDERNESS IN MOUTH AND THROAT WITH OR WITHOUT PRESENCE OF ULCERS (sore throat, sores in mouth, or a toothache) UNUSUAL RASH, SWELLING OR PAIN  UNUSUAL VAGINAL DISCHARGE OR ITCHING   Items with * indicate a potential emergency and should be followed up as soon as possible or go to the Emergency Department if any problems should occur.  Please show the CHEMOTHERAPY ALERT CARD or IMMUNOTHERAPY ALERT CARD at check-in  to the Emergency Department and triage nurse.  Should you have questions after your visit or need to cancel or reschedule your appointment, please contact [facility] [phone]  and follow the prompts.  Office hours are 8:00 a.m. to 4:30 p.m. Monday - Friday. Please note that voicemails left after 4:00 p.m. may not be returned until the following business day.  We are closed weekends and major holidays. You have access to a nurse at all times for urgent questions. Please call the main number to the clinic [phone] and follow the prompts.  For any non-urgent questions, you may also contact your provider using MyChart. We now offer e-Visits for anyone 57 and older to request care online for non-urgent symptoms. For details visit [email or web address].   Also download the MyChart app! Go to the app store, search "MyChart", open the app, select [facility], and log in with your MyChart username and password.  Masks are optional in the cancer centers. If you would like for your care team to wear a mask while they are taking care of you, please let them know. For doctor visits, patients may have with them one support person who is at least 80 years old. At this time, visitors are not allowed in the infusion area.

## 2021-10-06 NOTE — Progress Notes (Signed)
Patient presents today for one Unit of platelets and 1 UPRBC per providers order.  Vital signs WNL.  Patient has no new complaints at this time.  Stable during transfusion without adverse affects.  Vital signs stable.  No complaints at this time.  Discharge from clinic ambulatory in stable condition.  Alert and oriented X 3.  Follow up with [facility] as scheduled.

## 2021-10-06 NOTE — Progress Notes (Signed)
UNMATCHED BLOOD PRODUCT NOTE  Compare the patient ID on the blood tag to the patient ID on the hospital armband and Blood Bank armband. Then confirm the unit number on the blood tag matches the unit number on the blood product.  If a discrepancy is discovered return the product to blood bank immediately.   Blood verified by 2 nurses: Ronne Binning RN and Cathrine Page RN  Blood Product Type: Packed Red Blood Cells  Unit #: (Found on blood product bag, begins with W) [id number]  Product Code #: (Found on blood product bag, begins with E) [id number]   Start Time: 1110  Starting Rate: 120 ml/hr  Rate increase/decreased  (if applicable):  342 ml/hr  Rate changed time (if applicable):  8768  Stop Time: 1240   All Other Documentation should be documented within the Blood Admin Flowsheet per policy.

## 2021-10-06 NOTE — Progress Notes (Signed)
[place], [place] [address]   CLINIC:  Medical Oncology/Hematology  PCP:  [name], MD [address] / [place] [address] [phone]   REASON FOR VISIT:  Follow-up for IgA kappa plasma cell myeloma  PRIOR THERAPY: Velcade x 9 cycles from 07/04/2019 to 01/10/2020  CURRENT THERAPY: Revlimid 15 mg 2/3 weeks; Xgeva monthly  BRIEF ONCOLOGIC HISTORY:  Oncology History  Multiple myeloma not having achieved remission ([facility])  06/07/2019 Initial Diagnosis   Multiple myeloma not having achieved remission ([place])   07/04/2019 - 01/10/2020 Chemotherapy   The patient had dexamethasone (DECADRON) 4 MG tablet, 1 of 1 cycle, Start date: 01/10/2020, End date: -- lenalidomide (REVLIMID) 15 MG capsule, 1 of 1 cycle, Start date: 02/26/2020, End date: -- bortezomib SQ (VELCADE) chemo injection 2.25 mg, 1.3 mg/m2 = 2.25 mg, Subcutaneous,  Once, 9 of 10 cycles Administration: 2.25 mg (07/04/2019), 2.25 mg (07/11/2019), 2.25 mg (07/26/2019), 2.25 mg (08/02/2019), 2.25 mg (08/09/2019), 2.25 mg (08/16/2019), 2.25 mg (08/23/2019), 2.25 mg (08/30/2019), 2.25 mg (09/06/2019), 2.25 mg (09/13/2019), 2.25 mg (09/20/2019), 2.25 mg (09/27/2019), 2.25 mg (10/04/2019), 2.25 mg (10/11/2019), 2.25 mg (10/18/2019), 2.25 mg (10/25/2019), 2.25 mg (11/01/2019), 2.25 mg (11/08/2019), 2.25 mg (11/15/2019), 2.25 mg (11/22/2019), 2.25 mg (11/29/2019), 2.25 mg (12/06/2019), 2.25 mg (12/13/2019), 2.25 mg (12/20/2019), 2.25 mg (12/27/2019), 2.25 mg (01/03/2020), 2.25 mg (01/10/2020)  for chemotherapy treatment.    09/15/2021 -  Chemotherapy   Patient is on Treatment Plan : MYELOMA RELAPSED REFRACTORY Daratumumab SQ + Pomalidomide + Dexamethasone (DaraPd) q28d       CANCER STAGING:  Cancer Staging  No matching staging information was found for the patient.  INTERVAL HISTORY:  Courtney Arnold, a 81 y.o. female, returns for routine follow-up and consideration for next cycle of chemotherapy. Courtney Arnold was  last seen on 09/22/2021.  Due for day #8 cycle #1 of DARZALEX FASPRO today.   Overall, she tells me she has been feeling pretty well. She denies nosebleeds. She denies hematochezia and black stools. She denies cough, and she is eating well.   Overall, she feels ready for next cycle of chemo today.    REVIEW OF SYSTEMS:  Review of Systems  Constitutional:  Negative for appetite change and fatigue.  HENT:   Negative for nosebleeds.   Respiratory:  Negative for cough.   Gastrointestinal:  Negative for blood in stool.  All other systems reviewed and are negative.   PAST MEDICAL/SURGICAL HISTORY:  Past Medical History:  Diagnosis Date   Atrial fibrillation ([place]) 2011   Postop, spontaneous conversion to normal sinus after one hour   Compression fracture 07/24/2009   T12; kyphoplasty   History of echocardiogram 07/2009   EF 65%   Hypertension    Thyroid disease    Tobacco abuse    Past Surgical History:  Procedure Laterality Date   BACK SURGERY     BACK SURGERY  06/06/2015   BREAST EXCISIONAL BIOPSY Left    50 years ago  benign   CHOLECYSTECTOMY N/A 04/25/2015   Procedure: LAPAROSCOPIC CHOLECYSTECTOMY WITH INTRAOPERATIVE CHOLANGIOGRAM;  Surgeon: Mickeal Skinner, MD;  Location: [facility];  Service: General;  Laterality: N/A;   COLONOSCOPY N/A 11/26/2015   Procedure: COLONOSCOPY;  Surgeon: Daneil Dolin, MD;  Location: [facility];  Service: Endoscopy;  Laterality: N/A;  7:30 am   ERCP N/A 04/14/2015   Procedure: ENDOSCOPIC RETROGRADE CHOLANGIOPANCREATOGRAPHY (ERCP) Biliary Sphincterotomy, 10x7 stent placement Dilated bilary system just not well seen;  Surgeon:  Rogene Houston, MD;  Location: [facility];  Service: Endoscopy;  Laterality: N/A;   ERCP N/A 06/12/2015   Procedure: ENDOSCOPIC RETROGRADE CHOLANGIOPANCREATOGRAPHY (ERCP);  Surgeon: Rogene Houston, MD;  Location: [facility];  Service: Endoscopy;  Laterality: N/A;   ESOPHAGOGASTRODUODENOSCOPY N/A 06/12/2015   Procedure:  DIAGNOSTIC ESOPHAGOGASTRODUODENOSCOPY (EGD);  Surgeon: Rogene Houston, MD;  Location: [facility];  Service: Endoscopy;  Laterality: N/A;   FEMUR IM NAIL Right 05/11/2019   Procedure: RETROGRADE INTRAMEDULLARY NAIL FEMORAL;  Surgeon: Shona Needles, MD;  Location: [facility];  Service: Orthopedics;  Laterality: Right;   STENT REMOVAL  06/12/2015   Procedure: STENT REMOVAL ;  Surgeon: Rogene Houston, MD;  Location: [facility];  Service: Endoscopy;;    SOCIAL HISTORY:  Social History   Socioeconomic History   Marital status: Widowed    Spouse name: Not on file   Number of children: 2   Years of education: Not on file   Highest education level: Not on file  Occupational History   Occupation: retired  Tobacco Use   Smoking status: Former    Packs/day: 0.15    Years: 20.00    Total pack years: 3.00    Types: Cigarettes    Quit date: 03/08/2013    Years since quitting: 8.5   Smokeless tobacco: Never  Vaping Use   Vaping Use: Never used  Substance and Sexual Activity   Alcohol use: No    Alcohol/week: 0.0 standard drinks of alcohol   Drug use: No   Sexual activity: Not Currently  Other Topics Concern   Not on file  Social History Narrative   Active in gardens and does yard work.   Social Determinants of Health   Financial Resource Strain: Low Risk  (05/08/2019)   Overall Financial Resource Strain (CARDIA)    Difficulty of Paying Living Expenses: Not hard at all  Food Insecurity: No Food Insecurity (12/28/2019)   Hunger Vital Sign    Worried About Running Out of Food in the Last Year: Never true    Ran Out of Food in the Last Year: Never true  Transportation Needs: No Transportation Needs (06/14/2019)   PRAPARE - Hydrologist (Medical): No    Lack of Transportation (Non-Medical): No  Physical Activity: Inactive (05/08/2019)   Exercise Vital Sign    Days of Exercise per Week: 0 days    Minutes of Exercise per Session: 0 min  Stress: No Stress Concern  Present (05/08/2019)   [place]    Feeling of Stress : Not at all  Social Connections: Moderately Isolated (02/06/2020)   Social Connection and Isolation Panel [NHANES]    Frequency of Communication with Friends and Family: More than three times a week    Frequency of Social Gatherings with Friends and Family: More than three times a week    Attends Religious Services: More than 4 times per year    Active Member of [employer] or Organizations: No    Attends Archivist Meetings: Never    Marital Status: Widowed  Intimate Partner Violence: Not At Risk (05/08/2019)   Humiliation, Afraid, Rape, and Kick questionnaire    Fear of Current or Ex-Partner: No    Emotionally Abused: No    Physically Abused: No    Sexually Abused: No    FAMILY HISTORY:  Family History  Problem Relation Age of Onset   Heart attack Father  Stroke Father    Breast cancer Sister    Thyroid disease Neg Hx    Colon cancer Neg Hx     CURRENT MEDICATIONS:  Current Outpatient Medications  Medication Sig Dispense Refill   acyclovir (ZOVIRAX) 400 MG tablet TAKE (1) TABLET BY MOUTH TWICE DAILY. (Patient taking differently: Take 400 mg by mouth 2 (two) times daily.) 60 tablet 6   CALCIUM PO Take 1 capsule by mouth daily.     carboxymethylcellulose (REFRESH PLUS) 0.5 % SOLN Place 1 drop into both eyes in the morning, at noon, in the evening, and at bedtime.     Cholecalciferol (VITAMIN D) 125 MCG (5000 UT) CAPS Take 5,000 Units by mouth daily. 90 capsule 1   Cyanocobalamin (B-12 PO) Take 1 capsule by mouth daily.     feeding supplement (ENSURE ENLIVE / ENSURE PLUS) LIQD Take 237 mLs by mouth 2 (two) times daily between meals. 14220 mL 1   Ferrous Sulfate (IRON PO) Take 1 capsule by mouth daily.     fluconazole (DIFLUCAN) 100 MG tablet Take 1 tablet (100 mg total) by mouth daily for 4 days. 4 tablet 0   levothyroxine (SYNTHROID) 137 MCG tablet Take 1  tablet (137 mcg total) by mouth daily before breakfast. 90 tablet 3   Multiple Vitamin (MULTIVITAMIN WITH MINERALS) TABS tablet Take 1 tablet by mouth daily.     ondansetron (ZOFRAN) 8 MG tablet Take 1 tablet (8 mg total) by mouth every 8 (eight) hours as needed for nausea or vomiting. 60 tablet 0   potassium chloride SA (KLOR-CON M) 20 MEQ tablet Take 1 tablet (20 mEq total) by mouth as needed. Take Potassium 40 meq by mouth then wait three hours and take 20 meq by mouth and wait three hours and take 40 meq by mouth for a total of Potassium 100 meq. 5 tablet 1   POTASSIUM PO Take 1 capsule by mouth daily.     SSD 1 % cream Apply 1 Application topically daily. (Patient not taking: Reported on 09/27/2021)     No current facility-administered medications for this visit.   Facility-Administered Medications Ordered in Other Visits  Medication Dose Route Frequency Provider Last Rate Last Admin   0.9 %  sodium chloride infusion   Intravenous Continuous Derek Jack, MD   Stopped at 09/15/21 1454   daratumumab-hyaluronidase-fihj (DARZALEX FASPRO) 1800-30000 MG-UT/15ML chemo SQ injection 1,800 mg  1,800 mg Subcutaneous Once Derek Jack, MD        ALLERGIES:  No Known Allergies  PHYSICAL EXAM:  Performance status (ECOG): 1 - Symptomatic but completely ambulatory  There were no vitals filed for this visit. Wt Readings from Last 3 Encounters:  09/27/21 134 lb 0.6 oz (60.8 kg)  09/23/21 134 lb 14.7 oz (61.2 kg)  09/22/21 133 lb 14.4 oz (60.7 kg)   Physical Exam Vitals reviewed.  Constitutional:      Appearance: Normal appearance.  Cardiovascular:     Rate and Rhythm: Normal rate and regular rhythm.     Pulses: Normal pulses.     Heart sounds: Normal heart sounds.  Pulmonary:     Effort: Pulmonary effort is normal.     Breath sounds: Normal breath sounds.  Neurological:     General: No focal deficit present.     Mental Status: She is alert and oriented to person, place, and  time.  Psychiatric:        Mood and Affect: Mood normal.        Behavior: Behavior  normal.     LABORATORY DATA:  I have reviewed the labs as listed.     Latest Ref Rng & Units 10/05/2021    8:29 AM 10/02/2021    1:24 AM 10/01/2021    1:41 AM  CBC  WBC 4.0 - 10.5 K/uL 2.0  2.2  2.8   Hemoglobin 12.0 - 15.0 g/dL 8.3  8.1  8.5   Hematocrit 36.0 - 46.0 % 25.4  24.5  26.0   Platelets 150 - 400 K/uL 22  35  43       Latest Ref Rng & Units 10/05/2021    9:16 AM 09/29/2021    1:36 AM 09/28/2021   11:04 AM  CMP  Glucose 70 - 99 mg/dL 105  93  118   BUN 8 - 23 mg/dL 8  14  17    Creatinine 0.44 - 1.00 mg/dL 0.70  0.81  0.77   Sodium 135 - 145 mmol/L 136  137  139   Potassium 3.5 - 5.1 mmol/L 2.7  3.6  4.1   Chloride 98 - 111 mmol/L 98  105  106   CO2 22 - 32 mmol/L 29  23  25    Calcium 8.9 - 10.3 mg/dL 8.1  6.8  7.2   Total Protein 6.5 - 8.1 g/dL 6.1  5.0    Total Bilirubin 0.3 - 1.2 mg/dL 0.8  0.5    Alkaline Phos 38 - 126 U/L 72  82    AST 15 - 41 U/L 21  20    ALT 0 - 44 U/L 21  18      DIAGNOSTIC IMAGING:  I have independently reviewed the scans and discussed with the patient. CT CHEST WO CONTRAST  Result Date: 09/27/2021 CLINICAL DATA:  Shortness of breath, tachypnea, recent pneumonia EXAM: CT CHEST WITHOUT CONTRAST TECHNIQUE: Multidetector CT imaging of the chest was performed following the standard protocol without IV contrast. RADIATION DOSE REDUCTION: This exam was performed according to the departmental dose-optimization program which includes automated exposure control, adjustment of the mA and/or kV according to patient size and/or use of iterative reconstruction technique. COMPARISON:  Chest radiograph dated 09/27/2021 and 09/22/2021. FINDINGS: Cardiovascular: The heart is normal in size. No pericardial effusion. No evidence of thoracic aortic aneurysm. Atherosclerotic calcifications of the aortic arch. Three vessel coronary atherosclerosis. Mediastinum/Nodes: Small  mediastinal lymph nodes as a 90 pathologic CT size criteria. Visualized thyroid is unremarkable. Lungs/Pleura: Consolidative patchy opacity in the left lung apex/left upper lobe with associated air bronchograms in the medial left upper lobe/lingula. Additional mild patchy opacity in the posterior right upper lobe. When coupled with the rapid progression on chest radiographs, this appearance is compatible with multifocal pneumonia. Small left pleural effusion. No pneumothorax. Upper Abdomen: Visualized upper abdomen is notable for mild vascular calcifications and a 3.2 cm anterior left upper pole renal cyst. Musculoskeletal: Multiple lytic lesions, including in the medial clavicles, sternum, and multiple thoracic vertebral bodies, compatible with the patient's known multiple myeloma. Prior vertebral augmentation at T5, T6, and T11. Severe compression fracture deformity at T9. IMPRESSION: Multifocal upper lobe pneumonia, left greater than right. Associated small left pleural effusion. Consider follow-up chest radiographs in 4-6 weeks to document clearance. Multiple lytic lesions, compatible with the patient's known multiple myeloma. Aortic Atherosclerosis (ICD10-I70.0). Electronically Signed   By: Julian Hy M.D.   On: 09/27/2021 23:34   DG Chest Port 1 View  Result Date: 09/27/2021 CLINICAL DATA:  Nosebleed EXAM: PORTABLE CHEST 1 VIEW COMPARISON:  09/22/2021  FINDINGS: The heart size and mediastinal contours are within normal limits. Significant interval increase in heterogeneous and consolidative airspace opacity of the left apex. The visualized skeletal structures are unremarkable. IMPRESSION: Significant interval increase in heterogeneous and consolidative airspace opacity of the left apex, concerning for infection. Consider CT to further evaluate. Electronically Signed   By: Delanna Ahmadi M.D.   On: 09/27/2021 17:57   DG Chest Port 1 View  Result Date: 09/22/2021 CLINICAL DATA:  Abdominal pain,  multiple myeloma EXAM: PORTABLE CHEST 1 VIEW COMPARISON:  05/10/2018 chest radiograph. FINDINGS: Stable cardiomediastinal silhouette with normal heart size. No pneumothorax. No pleural effusion. Streaky upper left lung opacities are new. Vertebroplasty material overlies mid and lower thoracic vertebral bodies, unchanged. Healed posterolateral left sixth rib fracture. IMPRESSION: Streaky upper left lung opacities are new and could represent pneumonia and/or atelectasis. Follow-up chest radiographs advised. Electronically Signed   By: Ilona Sorrel M.D.   On: 09/22/2021 12:53   CT ABDOMEN PELVIS W CONTRAST  Result Date: 09/22/2021 CLINICAL DATA:  Abdominal pain, acute, nonlocalized EXAM: CT ABDOMEN AND PELVIS WITH CONTRAST TECHNIQUE: Multidetector CT imaging of the abdomen and pelvis was performed using the standard protocol following bolus administration of intravenous contrast. RADIATION DOSE REDUCTION: This exam was performed according to the departmental dose-optimization program which includes automated exposure control, adjustment of the mA and/or kV according to patient size and/or use of iterative reconstruction technique. CONTRAST:  7m OMNIPAQUE IOHEXOL 300 MG/ML  SOLN COMPARISON:  March 2021 FINDINGS: Lower chest: No acute abnormality. Hepatobiliary: No focal liver abnormality is seen. Status post cholecystectomy. No unexpected biliary dilatation. Pancreas: Unremarkable. Spleen: Unremarkable. Adrenals/Urinary Tract: Adrenals are unremarkable. Bilateral renal cysts. Bladder is unremarkable. Stomach/Bowel: Stomach is within normal limits. A small hiatal hernia is present bowel is normal in caliber. Normal appendix. Sigmoid diverticulosis. Vascular/Lymphatic: Atherosclerosis.  No enlarged nodes. Reproductive: Uterus and bilateral adnexa are unremarkable. Other: No free fluid. Musculoskeletal: Right femoral intramedullary rod is partially imaged. Chronic treated T12 compression fracture. Chronic L4  compression fracture. No new destructive osseous lesion. IMPRESSION: No acute abnormality. Sigmoid diverticulosis.  Small hiatal hernia. Electronically Signed   By: PMacy MisM.D.   On: 09/22/2021 12:30     ASSESSMENT:  1.  IgA kappa plasma cell myeloma: -IgA kappa plasma cell myeloma diagnosed on right sacral bone biopsy on 05/17/2019.  SPEP did not show M spike.  Immunofixation shows IgA kappa.  Kappa light chains 42.8, lambda light chains at 9.9, ratio 4.32.  Beta-2 microglobulin 3.7. -PET scan on 06/04/2019 showed expansile lytic lesion involving the right sacral wing.  Lytic lesion involving T10 vertebral body.  Left seventh rib lesion.  Increased uptake in a pathological fracture involving distal aspect of the right femur. 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 -RVD started on 06/26/2019.  Revlimid 15 mg 2 weeks on/1 week off. -Velcade discontinued on 01/10/2020. -She is taking Revlimid 15 mg 2 weeks on 1 week off, with dexamethasone 20 mg on weeks of Revlimid and 10 mg while she is off Revlimid.  Revlimid dose further reduced to 10 mg 3 weeks on/1 week off.  She has developed progressive anemia. - BMBX (08/07/2021): 38% plasma cells in the marrow.  Cytogenetics was normal.  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 - DPD regimen started on, single agent Darzalex and dexamethasone started on 09/15/2021.  Daratumumab held after first treatment due to pneumonia. - Pomalidomide  2 mg 3 weeks on/1 week off and dexamethasone 20 mg weekly started on 10/06/2021.   PLAN:  1.  Relapsed IgA kappa plasma cell myeloma: - She has received first dose of daratumumab with 20 mg dexamethasone on 09/15/2021. - She developed pneumonia and was admitted to the hospital. - She was admitted second time with nosebleed and required nasal packing and platelet transfusions. - I have reviewed hospitalization records. - Since discharge, she  does not report any nosebleeds.  She has finished Diflucan and Augmentin yesterday.  Overall her energy levels have improved. - Reviewed labs today which showed CMP is within normal limits.  White count is 2.0 with ANC of 0.8.  Platelet count is 21. - I have recommended starting Pomalyst 2 mg 3 weeks on/1 week off along with dexamethasone 20 mg weekly starting today. - I will reevaluate her in 1 week with repeat labs.   2.  Hypokalemia: - Continue OTC potassium daily.   3.  Normocytic anemia: - Hemoglobin today 7.7.  Will transfuse 1 unit PRBC. - Platelet count is 21.  She will receive 1 unit platelets.   4.  ID prophylaxis: - Continue acyclovir twice daily and aspirin 81 mg daily.   5.  Myeloma bone disease: - Continue calcium supplements and monthly denosumab.   Orders placed this encounter:  No orders of the defined types were placed in this encounter.    Derek Jack, MD [facility] [phone]   I, Thana Ates, am acting as a scribe for Dr. Derek Jack.  I, Derek Jack MD, have reviewed the above documentation for accuracy and completeness, and I agree with the above.

## 2021-10-06 NOTE — Progress Notes (Unsigned)
CRITICAL VALUE ALERT Critical value received:  platelets 21.0 Date of notification:  10-06-21 Time of notification: 0911 Critical value read back:  Yes.   Nurse who received alert:  C. Jariah Jarmon RN MD notified time and response:  [phone] , Dr. Raliegh Ip, will give one unit of blood and one unit of platelets per MD.

## 2021-10-06 NOTE — Patient Instructions (Signed)
[place] at [facility] Discharge Instructions   You were seen and examined today by Dr. Delton Coombes.  He reviewed the results of your lab work. Your platelets are low and your hemoglobin is low. We will give you blood and platelets today.  Start Pomalyst today and take as prescribed. Also start dexamethasone 5 tablets. This is taken once a week.   We will recheck your blood work on Friday.   We will see back in 1 week.    Thank you for choosing [place] at [facility] to provide your oncology and hematology care.  To afford each patient quality time with our provider, please arrive at least 15 minutes before your scheduled appointment time.   If you have a lab appointment with the [facility] please come in thru the Main Entrance and check in at the main information desk.  You need to re-schedule your appointment should you arrive 10 or more minutes late.  We strive to give you quality time with our providers, and arriving late affects you and other patients whose appointments are after yours.  Also, if you no show three or more times for appointments you may be dismissed from the clinic at the providers discretion.     Again, thank you for choosing [facility].  Our hope is that these requests will decrease the amount of time that you wait before being seen by our physicians.       _____________________________________________________________  Should you have questions after your visit to [facility], please contact our office at [phone] and follow the prompts.  Our office hours are 8:00 a.m. and 4:30 p.m. Monday - Friday.  Please note that voicemails left after 4:00 p.m. may not be returned until the following business day.  We are closed weekends and major holidays.  You do have access to a nurse 24-7, just call the main number to the clinic [phone] and do not press any options, hold on the line and a  nurse will answer the phone.    For prescription refill requests, have your pharmacy contact our office and allow 72 hours.    Due to Covid, you will need to wear a mask upon entering the hospital. If you do not have a mask, a mask will be given to you at the Main Entrance upon arrival. For doctor visits, patients may have 1 support person age 56 or older with them. For treatment visits, patients can not have anyone with them due to social distancing guidelines and our immunocompromised population.

## 2021-10-07 ENCOUNTER — Encounter: Payer: Self-pay | Admitting: Hematology

## 2021-10-07 ENCOUNTER — Encounter (HOSPITAL_COMMUNITY): Payer: Self-pay | Admitting: Hematology

## 2021-10-07 LAB — BPAM PLATELET PHERESIS
Blood Product Expiration Date: 202308012359
ISSUE DATE / TIME: 202308011257
Unit Type and Rh: 6200

## 2021-10-07 LAB — PREPARE PLATELET PHERESIS: Unit division: 0

## 2021-10-09 ENCOUNTER — Inpatient Hospital Stay: Payer: Medicare Other

## 2021-10-09 DIAGNOSIS — C9000 Multiple myeloma not having achieved remission: Secondary | ICD-10-CM

## 2021-10-09 DIAGNOSIS — Z79899 Other long term (current) drug therapy: Secondary | ICD-10-CM | POA: Diagnosis not present

## 2021-10-09 DIAGNOSIS — Z5112 Encounter for antineoplastic immunotherapy: Secondary | ICD-10-CM | POA: Diagnosis not present

## 2021-10-09 DIAGNOSIS — C9002 Multiple myeloma in relapse: Secondary | ICD-10-CM | POA: Diagnosis not present

## 2021-10-09 LAB — CBC
HCT: 27.9 % — ABNORMAL LOW (ref 36.0–46.0)
Hemoglobin: 9.1 g/dL — ABNORMAL LOW (ref 12.0–15.0)
MCH: 29.2 pg (ref 26.0–34.0)
MCHC: 32.6 g/dL (ref 30.0–36.0)
MCV: 89.4 fL (ref 80.0–100.0)
Platelets: 35 10*3/uL — ABNORMAL LOW (ref 150–400)
RBC: 3.12 MIL/uL — ABNORMAL LOW (ref 3.87–5.11)
RDW: 17.7 % — ABNORMAL HIGH (ref 11.5–15.5)
WBC: 1.9 10*3/uL — ABNORMAL LOW (ref 4.0–10.5)
nRBC: 0 % (ref 0.0–0.2)

## 2021-10-09 LAB — SAMPLE TO BLOOD BANK

## 2021-10-09 NOTE — Progress Notes (Signed)
Patient Plt count is 35 today. Per providers parameters no platelets needed at this time. Copy of labs given to family. Voiced understanding.

## 2021-10-10 LAB — TYPE AND SCREEN
ABO/RH(D): O POS
Antibody Screen: POSITIVE
Donor AG Type: NEGATIVE
Donor AG Type: NEGATIVE
Unit division: 0
Unit division: 0

## 2021-10-10 LAB — BPAM RBC
Blood Product Expiration Date: 202308122359
ISSUE DATE / TIME: 202308011056
Unit Type and Rh: 5100

## 2021-10-12 ENCOUNTER — Inpatient Hospital Stay: Payer: Medicare Other

## 2021-10-12 DIAGNOSIS — C9000 Multiple myeloma not having achieved remission: Secondary | ICD-10-CM

## 2021-10-12 DIAGNOSIS — Z5112 Encounter for antineoplastic immunotherapy: Secondary | ICD-10-CM | POA: Diagnosis not present

## 2021-10-12 DIAGNOSIS — C9002 Multiple myeloma in relapse: Secondary | ICD-10-CM | POA: Diagnosis not present

## 2021-10-12 DIAGNOSIS — Z79899 Other long term (current) drug therapy: Secondary | ICD-10-CM | POA: Diagnosis not present

## 2021-10-12 LAB — COMPREHENSIVE METABOLIC PANEL
ALT: 24 U/L (ref 0–44)
AST: 19 U/L (ref 15–41)
Albumin: 3.1 g/dL — ABNORMAL LOW (ref 3.5–5.0)
Alkaline Phosphatase: 86 U/L (ref 38–126)
Anion gap: 9 (ref 5–15)
BUN: 13 mg/dL (ref 8–23)
CO2: 29 mmol/L (ref 22–32)
Calcium: 9 mg/dL (ref 8.9–10.3)
Chloride: 96 mmol/L — ABNORMAL LOW (ref 98–111)
Creatinine, Ser: 0.78 mg/dL (ref 0.44–1.00)
GFR, Estimated: >60 mL/min (ref >60)
Glucose, Bld: 120 mg/dL — ABNORMAL HIGH (ref 70–99)
Potassium: 3.6 mmol/L (ref 3.5–5.1)
Sodium: 134 mmol/L — ABNORMAL LOW (ref 135–145)
Total Bilirubin: 0.6 mg/dL (ref 0.3–1.2)
Total Protein: 6.2 g/dL — ABNORMAL LOW (ref 6.5–8.1)

## 2021-10-12 LAB — TYPE AND SCREEN
ABO/RH(D): O POS
Antibody Screen: POSITIVE

## 2021-10-12 LAB — CBC WITH DIFFERENTIAL/PLATELET
Abs Immature Granulocytes: 0 10*3/uL (ref 0.00–0.07)
Band Neutrophils: 5 %
Basophils Absolute: 0 10*3/uL (ref 0.0–0.1)
Basophils Relative: 0 %
Eosinophils Absolute: 0 10*3/uL (ref 0.0–0.5)
Eosinophils Relative: 2 %
HCT: 25.8 % — ABNORMAL LOW (ref 36.0–46.0)
Hemoglobin: 8.1 g/dL — ABNORMAL LOW (ref 12.0–15.0)
Lymphocytes Relative: 37 %
Lymphs Abs: 0.5 10*3/uL — ABNORMAL LOW (ref 0.7–4.0)
MCH: 28.6 pg (ref 26.0–34.0)
MCHC: 31.4 g/dL (ref 30.0–36.0)
MCV: 91.2 fL (ref 80.0–100.0)
Monocytes Absolute: 0 10*3/uL — ABNORMAL LOW (ref 0.1–1.0)
Monocytes Relative: 1 %
Neutro Abs: 0.8 10*3/uL — ABNORMAL LOW (ref 1.7–7.7)
Neutrophils Relative %: 55 %
RBC: 2.83 MIL/uL — ABNORMAL LOW (ref 3.87–5.11)
RDW: 17.1 % — ABNORMAL HIGH (ref 11.5–15.5)
Smear Review: UNDETERMINED
WBC: 1.3 10*3/uL — CL (ref 4.0–10.5)
nRBC: 0 % (ref 0.0–0.2)

## 2021-10-12 LAB — SAMPLE TO BLOOD BANK

## 2021-10-12 LAB — MAGNESIUM: Magnesium: 2 mg/dL (ref 1.7–2.4)

## 2021-10-12 NOTE — Progress Notes (Unsigned)
CRITICAL VALUE ALERT Critical value received:  1.3 WBC, Platelets DCLMP unable to estimate. ANC 0.8.  Date of notification:  10/12/2021 Time of notification: 12:21pm.  Critical value read back:  Yes.   Nurse who received alert:  Bpresnell RN MD notified time and response:  [place] @ 12:50 pm. Patient to receive 1 unit of platelets tomorrow.

## 2021-10-13 ENCOUNTER — Inpatient Hospital Stay (HOSPITAL_BASED_OUTPATIENT_CLINIC_OR_DEPARTMENT_OTHER): Payer: Medicare Other | Admitting: Hematology

## 2021-10-13 ENCOUNTER — Inpatient Hospital Stay: Payer: Medicare Other

## 2021-10-13 DIAGNOSIS — D649 Anemia, unspecified: Secondary | ICD-10-CM

## 2021-10-13 DIAGNOSIS — C9002 Multiple myeloma in relapse: Secondary | ICD-10-CM | POA: Diagnosis not present

## 2021-10-13 DIAGNOSIS — C9000 Multiple myeloma not having achieved remission: Secondary | ICD-10-CM

## 2021-10-13 DIAGNOSIS — Z5112 Encounter for antineoplastic immunotherapy: Secondary | ICD-10-CM | POA: Diagnosis not present

## 2021-10-13 DIAGNOSIS — Z79899 Other long term (current) drug therapy: Secondary | ICD-10-CM | POA: Diagnosis not present

## 2021-10-13 MED ORDER — SODIUM CHLORIDE 0.9% IV SOLUTION
250.0000 mL | Freq: Once | INTRAVENOUS | Status: AC
  Administered 2021-10-13: 250 mL via INTRAVENOUS

## 2021-10-13 MED ORDER — ACETAMINOPHEN 325 MG PO TABS
650.0000 mg | ORAL_TABLET | Freq: Once | ORAL | Status: AC
  Administered 2021-10-13: 650 mg via ORAL
  Filled 2021-10-13: qty 2

## 2021-10-13 MED ORDER — DIPHENHYDRAMINE HCL 25 MG PO CAPS
25.0000 mg | ORAL_CAPSULE | Freq: Once | ORAL | Status: AC
  Administered 2021-10-13: 25 mg via ORAL
  Filled 2021-10-13: qty 1

## 2021-10-13 MED ORDER — PROCHLORPERAZINE EDISYLATE 10 MG/2ML IJ SOLN
10.0000 mg | Freq: Once | INTRAMUSCULAR | Status: AC
  Administered 2021-10-13: 10 mg via INTRAVENOUS
  Filled 2021-10-13: qty 2

## 2021-10-13 NOTE — Progress Notes (Signed)
Patient is taking Pomalyst as prescribed.  She has not missed any doses and reports no side effects at this time.   

## 2021-10-13 NOTE — Progress Notes (Signed)
[place], [place] [address]   CLINIC:  Medical Oncology/Hematology  PCP:  [name], MD [address] / [place] [address] [phone]   REASON FOR VISIT:  Follow-up for IgA kappa plasma cell myeloma  PRIOR THERAPY: Velcade x 9 cycles from 07/04/2019 to 01/10/2020  CURRENT THERAPY: Revlimid 15 mg 2/3 weeks; Xgeva monthly  BRIEF ONCOLOGIC HISTORY:  Oncology History  Multiple myeloma not having achieved remission ([place])  06/07/2019 Initial Diagnosis   Multiple myeloma not having achieved remission ([place])   07/04/2019 - 01/10/2020 Chemotherapy   The patient had dexamethasone (DECADRON) 4 MG tablet, 1 of 1 cycle, Start date: 01/10/2020, End date: -- lenalidomide (REVLIMID) 15 MG capsule, 1 of 1 cycle, Start date: 02/26/2020, End date: -- bortezomib SQ (VELCADE) chemo injection 2.25 mg, 1.3 mg/m2 = 2.25 mg, Subcutaneous,  Once, 9 of 10 cycles Administration: 2.25 mg (07/04/2019), 2.25 mg (07/11/2019), 2.25 mg (07/26/2019), 2.25 mg (08/02/2019), 2.25 mg (08/09/2019), 2.25 mg (08/16/2019), 2.25 mg (08/23/2019), 2.25 mg (08/30/2019), 2.25 mg (09/06/2019), 2.25 mg (09/13/2019), 2.25 mg (09/20/2019), 2.25 mg (09/27/2019), 2.25 mg (10/04/2019), 2.25 mg (10/11/2019), 2.25 mg (10/18/2019), 2.25 mg (10/25/2019), 2.25 mg (11/01/2019), 2.25 mg (11/08/2019), 2.25 mg (11/15/2019), 2.25 mg (11/22/2019), 2.25 mg (11/29/2019), 2.25 mg (12/06/2019), 2.25 mg (12/13/2019), 2.25 mg (12/20/2019), 2.25 mg (12/27/2019), 2.25 mg (01/03/2020), 2.25 mg (01/10/2020)  for chemotherapy treatment.    09/15/2021 -  Chemotherapy   Patient is on Treatment Plan : MYELOMA RELAPSED REFRACTORY Daratumumab SQ + Pomalidomide + Dexamethasone (DaraPd) q28d       CANCER STAGING:  Cancer Staging  No matching staging information was found for the patient.  INTERVAL HISTORY:  Courtney Arnold, a 81 y.o. female, is seen for follow-up of multiple myeloma and severe thrombocytopenia and nosebleeds.  She  started taking pomalidomide 2 mg daily on 10/06/2021.  She is also taking 10 mg of dexamethasone on Tuesday and 10 mg on Wednesday.  She reported having nosebleed on Saturday but was easily contained.  She has some soreness in the throat and decreased hearing.   REVIEW OF SYSTEMS:  Review of Systems  Constitutional:  Negative for appetite change and fatigue.  HENT:   Positive for nosebleeds.   Respiratory:  Negative for cough.   Gastrointestinal:  Negative for blood in stool.  All other systems reviewed and are negative.   PAST MEDICAL/SURGICAL HISTORY:  Past Medical History:  Diagnosis Date   Atrial fibrillation ([place]) 2011   Postop, spontaneous conversion to normal sinus after one hour   Compression fracture 07/24/2009   T12; kyphoplasty   History of echocardiogram 07/2009   EF 65%   Hypertension    Thyroid disease    Tobacco abuse    Past Surgical History:  Procedure Laterality Date   BACK SURGERY     BACK SURGERY  06/06/2015   BREAST EXCISIONAL BIOPSY Left    50 years ago  benign   CHOLECYSTECTOMY N/A 04/25/2015   Procedure: LAPAROSCOPIC CHOLECYSTECTOMY WITH INTRAOPERATIVE CHOLANGIOGRAM;  Surgeon: Mickeal Skinner, MD;  Location: [facility];  Service: General;  Laterality: N/A;   COLONOSCOPY N/A 11/26/2015   Procedure: COLONOSCOPY;  Surgeon: Daneil Dolin, MD;  Location: [facility];  Service: Endoscopy;  Laterality: N/A;  7:30 am   ERCP N/A 04/14/2015   Procedure: ENDOSCOPIC RETROGRADE CHOLANGIOPANCREATOGRAPHY (ERCP) Biliary Sphincterotomy, 10x7 stent placement Dilated bilary system just not well seen;  Surgeon: Rogene Houston, MD;  Location: [facility];  Service:  Endoscopy;  Laterality: N/A;   ERCP N/A 06/12/2015   Procedure: ENDOSCOPIC RETROGRADE CHOLANGIOPANCREATOGRAPHY (ERCP);  Surgeon: Rogene Houston, MD;  [facility]: [facility];  Service: Endoscopy;  Laterality: N/A;   ESOPHAGOGASTRODUODENOSCOPY N/A 06/12/2015   Procedure: DIAGNOSTIC ESOPHAGOGASTRODUODENOSCOPY (EGD);   Surgeon: Rogene Houston, MD;  [facility];  Service: Endoscopy;  Laterality: N/A;   FEMUR IM NAIL Right 05/11/2019   Procedure: RETROGRADE INTRAMEDULLARY NAIL FEMORAL;  Surgeon: Shona Needles, MD;  Location: [facility];  Service: Orthopedics;  Laterality: Right;   STENT REMOVAL  06/12/2015   Procedure: STENT REMOVAL ;  Surgeon: Rogene Houston, MD;  [facility]: [facility];  Service: Endoscopy;;    SOCIAL HISTORY:  Social History   Socioeconomic History   Marital status: Widowed    Spouse name: Not on file   Number of children: 2   Years of education: Not on file   Highest education level: Not on file  Occupational History   Occupation: retired  Tobacco Use   Smoking status: Former    Packs/day: 0.15    Years: 20.00    Total pack years: 3.00    Types: Cigarettes    Quit date: 03/08/2013    Years since quitting: 8.6   Smokeless tobacco: Never  Vaping Use   Vaping Use: Never used  Substance and Sexual Activity   Alcohol use: No    Alcohol/week: 0.0 standard drinks of alcohol   Drug use: No   Sexual activity: Not Currently  Other Topics Concern   Not on file  Social History Narrative   Active in gardens and does yard work.   Social Determinants of Health   Financial Resource Strain: Low Risk  (05/08/2019)   Overall Financial Resource Strain (CARDIA)    Difficulty of Paying Living Expenses: Not hard at all  Food Insecurity: No Food Insecurity (12/28/2019)   Hunger Vital Sign    Worried About Running Out of Food in the Last Year: Never true    Ran Out of Food in the Last Year: Never true  Transportation Needs: No Transportation Needs (06/14/2019)   PRAPARE - Hydrologist (Medical): No    Lack of Transportation (Non-Medical): No  Physical Activity: Inactive (05/08/2019)   Exercise Vital Sign    Days of Exercise per Week: 0 days    Minutes of Exercise per Session: 0 min  Stress: No Stress Concern Present (05/08/2019)   [place]    Feeling of Stress : Not at all  Social Connections: Moderately Isolated (02/06/2020)   Social Connection and Isolation Panel [NHANES]    Frequency of Communication with Friends and Family: More than three times a week    Frequency of Social Gatherings with Friends and Family: More than three times a week    Attends Religious Services: More than 4 times per year    Active Member of [employer] or Organizations: No    Attends Archivist Meetings: Never    Marital Status: Widowed  Intimate Partner Violence: Not At Risk (05/08/2019)   Humiliation, Afraid, Rape, and Kick questionnaire    Fear of Current or Ex-Partner: No    Emotionally Abused: No    Physically Abused: No    Sexually Abused: No    FAMILY HISTORY:  Family History  Problem Relation Age of Onset   Heart attack Father    Stroke Father    Breast cancer Sister  Thyroid disease Neg Hx    Colon cancer Neg Hx     CURRENT MEDICATIONS:  Current Outpatient Medications  Medication Sig Dispense Refill   acyclovir (ZOVIRAX) 400 MG tablet TAKE (1) TABLET BY MOUTH TWICE DAILY. (Patient taking differently: Take 400 mg by mouth 2 (two) times daily.) 60 tablet 6   CALCIUM PO Take 1 capsule by mouth daily.     carboxymethylcellulose (REFRESH PLUS) 0.5 % SOLN Place 1 drop into both eyes in the morning, at noon, in the evening, and at bedtime.     Cholecalciferol (VITAMIN D) 125 MCG (5000 UT) CAPS Take 5,000 Units by mouth daily. 90 capsule 1   Cyanocobalamin (B-12 PO) Take 1 capsule by mouth daily.     feeding supplement (ENSURE ENLIVE / ENSURE PLUS) LIQD Take 237 mLs by mouth 2 (two) times daily between meals. 14220 mL 1   Ferrous Sulfate (IRON PO) Take 1 capsule by mouth daily.     levothyroxine (SYNTHROID) 137 MCG tablet Take 1 tablet (137 mcg total) by mouth daily before breakfast. 90 tablet 3   Multiple Vitamin (MULTIVITAMIN WITH MINERALS) TABS tablet Take 1 tablet by  mouth daily.     ondansetron (ZOFRAN) 8 MG tablet Take 1 tablet (8 mg total) by mouth every 8 (eight) hours as needed for nausea or vomiting. 60 tablet 0   potassium chloride SA (KLOR-CON M) 20 MEQ tablet Take 1 tablet (20 mEq total) by mouth as needed. Take Potassium 40 meq by mouth then wait three hours and take 20 meq by mouth and wait three hours and take 40 meq by mouth for a total of Potassium 100 meq. 5 tablet 1   POTASSIUM PO Take 1 capsule by mouth daily.     SSD 1 % cream Apply 1 Application topically daily.     No current facility-administered medications for this visit.   Facility-Administered Medications Ordered in Other Visits  Medication Dose Route Frequency Provider Last Rate Last Admin   0.9 %  sodium chloride infusion (Manually program via Guardrails IV Fluids)  250 mL Intravenous Once Derek Jack, MD       0.9 %  sodium chloride infusion   Intravenous Continuous Derek Jack, MD   Stopped at 09/15/21 1454   daratumumab-hyaluronidase-fihj (DARZALEX FASPRO) 1800-30000 MG-UT/15ML chemo SQ injection 1,800 mg  1,800 mg Subcutaneous Once Derek Jack, MD        ALLERGIES:  No Known Allergies  PHYSICAL EXAM:  Performance status (ECOG): 1 - Symptomatic but completely ambulatory  There were no vitals filed for this visit. Wt Readings from Last 3 Encounters:  09/27/21 134 lb 0.6 oz (60.8 kg)  09/23/21 134 lb 14.7 oz (61.2 kg)  09/22/21 133 lb 14.4 oz (60.7 kg)   Physical Exam Vitals reviewed.  Constitutional:      Appearance: Normal appearance.  Cardiovascular:     Rate and Rhythm: Normal rate and regular rhythm.     Pulses: Normal pulses.     Heart sounds: Normal heart sounds.  Pulmonary:     Effort: Pulmonary effort is normal.     Breath sounds: Normal breath sounds.  Neurological:     General: No focal deficit present.     Mental Status: She is alert and oriented to person, place, and time.  Psychiatric:        Mood and Affect: Mood  normal.        Behavior: Behavior normal.   Blood in the back of the throat.  LABORATORY DATA:  I have reviewed the labs as listed.     Latest Ref Rng & Units 10/12/2021   11:06 AM 10/09/2021    7:59 AM 10/06/2021    8:21 AM  CBC  WBC 4.0 - 10.5 K/uL 1.3  1.9  2.0   Hemoglobin 12.0 - 15.0 g/dL 8.1  9.1  7.7   Hematocrit 36.0 - 46.0 % 25.8  27.9  23.8   Platelets 150 - 400 K/uL DCLMP  35  21       Latest Ref Rng & Units 10/12/2021   11:06 AM 10/06/2021    8:21 AM 10/05/2021    9:16 AM  CMP  Glucose 70 - 99 mg/dL 120  112  105   BUN 8 - 23 mg/dL 13  7  8    Creatinine 0.44 - 1.00 mg/dL 0.78  0.68  0.70   Sodium 135 - 145 mmol/L 134  136  136   Potassium 3.5 - 5.1 mmol/L 3.6  4.0  2.7   Chloride 98 - 111 mmol/L 96  100  98   CO2 22 - 32 mmol/L 29  29  29    Calcium 8.9 - 10.3 mg/dL 9.0  8.4  8.1   Total Protein 6.5 - 8.1 g/dL 6.2  6.0  6.1   Total Bilirubin 0.3 - 1.2 mg/dL 0.6  0.8  0.8   Alkaline Phos 38 - 126 U/L 86  72  72   AST 15 - 41 U/L 19  20  21    ALT 0 - 44 U/L 24  20  21      DIAGNOSTIC IMAGING:  I have independently reviewed the scans and discussed with the patient. CT CHEST WO CONTRAST  Result Date: 09/27/2021 CLINICAL DATA:  Shortness of breath, tachypnea, recent pneumonia EXAM: CT CHEST WITHOUT CONTRAST TECHNIQUE: Multidetector CT imaging of the chest was performed following the standard protocol without IV contrast. RADIATION DOSE REDUCTION: This exam was performed according to the departmental dose-optimization program which includes automated exposure control, adjustment of the mA and/or kV according to patient size and/or use of iterative reconstruction technique. COMPARISON:  Chest radiograph dated 09/27/2021 and 09/22/2021. FINDINGS: Cardiovascular: The heart is normal in size. No pericardial effusion. No evidence of thoracic aortic aneurysm. Atherosclerotic calcifications of the aortic arch. Three vessel coronary atherosclerosis. Mediastinum/Nodes: Small mediastinal  lymph nodes as a 90 pathologic CT size criteria. Visualized thyroid is unremarkable. Lungs/Pleura: Consolidative patchy opacity in the left lung apex/left upper lobe with associated air bronchograms in the medial left upper lobe/lingula. Additional mild patchy opacity in the posterior right upper lobe. When coupled with the rapid progression on chest radiographs, this appearance is compatible with multifocal pneumonia. Small left pleural effusion. No pneumothorax. Upper Abdomen: Visualized upper abdomen is notable for mild vascular calcifications and a 3.2 cm anterior left upper pole renal cyst. Musculoskeletal: Multiple lytic lesions, including in the medial clavicles, sternum, and multiple thoracic vertebral bodies, compatible with the patient's known multiple myeloma. Prior vertebral augmentation at T5, T6, and T11. Severe compression fracture deformity at T9. IMPRESSION: Multifocal upper lobe pneumonia, left greater than right. Associated small left pleural effusion. Consider follow-up chest radiographs in 4-6 weeks to document clearance. Multiple lytic lesions, compatible with the patient's known multiple myeloma. Aortic Atherosclerosis (ICD10-I70.0). Electronically Signed   By: Julian Hy M.D.   On: 09/27/2021 23:34   DG Chest Port 1 View  Result Date: 09/27/2021 CLINICAL DATA:  Nosebleed EXAM: PORTABLE CHEST 1 VIEW COMPARISON:  09/22/2021 FINDINGS: The heart size  and mediastinal contours are within normal limits. Significant interval increase in heterogeneous and consolidative airspace opacity of the left apex. The visualized skeletal structures are unremarkable. IMPRESSION: Significant interval increase in heterogeneous and consolidative airspace opacity of the left apex, concerning for infection. Consider CT to further evaluate. Electronically Signed   By: Delanna Ahmadi M.D.   On: 09/27/2021 17:57   DG Chest Port 1 View  Result Date: 09/22/2021 CLINICAL DATA:  Abdominal pain, multiple myeloma  EXAM: PORTABLE CHEST 1 VIEW COMPARISON:  05/10/2018 chest radiograph. FINDINGS: Stable cardiomediastinal silhouette with normal heart size. No pneumothorax. No pleural effusion. Streaky upper left lung opacities are new. Vertebroplasty material overlies mid and lower thoracic vertebral bodies, unchanged. Healed posterolateral left sixth rib fracture. IMPRESSION: Streaky upper left lung opacities are new and could represent pneumonia and/or atelectasis. Follow-up chest radiographs advised. Electronically Signed   By: Ilona Sorrel M.D.   On: 09/22/2021 12:53   CT ABDOMEN PELVIS W CONTRAST  Result Date: 09/22/2021 CLINICAL DATA:  Abdominal pain, acute, nonlocalized EXAM: CT ABDOMEN AND PELVIS WITH CONTRAST TECHNIQUE: Multidetector CT imaging of the abdomen and pelvis was performed using the standard protocol following bolus administration of intravenous contrast. RADIATION DOSE REDUCTION: This exam was performed according to the departmental dose-optimization program which includes automated exposure control, adjustment of the mA and/or kV according to patient size and/or use of iterative reconstruction technique. CONTRAST:  9m OMNIPAQUE IOHEXOL 300 MG/ML  SOLN COMPARISON:  March 2021 FINDINGS: Lower chest: No acute abnormality. Hepatobiliary: No focal liver abnormality is seen. Status post cholecystectomy. No unexpected biliary dilatation. Pancreas: Unremarkable. Spleen: Unremarkable. Adrenals/Urinary Tract: Adrenals are unremarkable. Bilateral renal cysts. Bladder is unremarkable. Stomach/Bowel: Stomach is within normal limits. A small hiatal hernia is present bowel is normal in caliber. Normal appendix. Sigmoid diverticulosis. Vascular/Lymphatic: Atherosclerosis.  No enlarged nodes. Reproductive: Uterus and bilateral adnexa are unremarkable. Other: No free fluid. Musculoskeletal: Right femoral intramedullary rod is partially imaged. Chronic treated T12 compression fracture. Chronic L4 compression fracture. No  new destructive osseous lesion. IMPRESSION: No acute abnormality. Sigmoid diverticulosis.  Small hiatal hernia. Electronically Signed   By: PMacy MisM.D.   On: 09/22/2021 12:30     ASSESSMENT:  1.  IgA kappa plasma cell myeloma: -IgA kappa plasma cell myeloma diagnosed on right sacral bone biopsy on 05/17/2019.  SPEP did not show M spike.  Immunofixation shows IgA kappa.  Kappa light chains 42.8, lambda light chains at 9.9, ratio 4.32.  Beta-2 microglobulin 3.7. -PET scan on 06/04/2019 showed expansile lytic lesion involving the right sacral wing.  Lytic lesion involving T10 vertebral body.  Left seventh rib lesion.  Increased uptake in a pathological fracture involving distal aspect of the right femur. 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 -RVD started on 06/26/2019.  Revlimid 15 mg 2 weeks on/1 week off. -Velcade discontinued on 01/10/2020. -She is taking Revlimid 15 mg 2 weeks on 1 week off, with dexamethasone 20 mg on weeks of Revlimid and 10 mg while she is off Revlimid.  Revlimid dose further reduced to 10 mg 3 weeks on/1 week off.  She has developed progressive anemia. - BMBX (08/07/2021): 38% plasma cells in the marrow.  Cytogenetics was normal.  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 - DPD regimen started on, single agent Darzalex and dexamethasone started on 09/15/2021.  Daratumumab held after first treatment due to pneumonia. - Pomalidomide 2 mg 3 weeks  on/1 week off and dexamethasone 20 mg weekly started on 10/06/2021.   PLAN:  1.  Relapsed IgA kappa plasma cell myeloma: - Pomalidomide 2 mg 3 weeks on/1 week off started on 10/06/2021.  She did not have any GI side effects.  She is also taking dexamethasone 10 mg on Tuesday and 10 mg on Wednesday. - Reviewed labs from 10/12/2021 which showed white count 1.3 with ANC of 0.8.  Platelets are clumped.  Hemoglobin 8.1.  LFTs are normal.  Creatinine was normal. -  She will be transfused 1 unit of platelets today.  We will check her CBC again on Thursday evening to see if she needs any transfusion on Friday. - Continued pomalidomide and dexamethasone. - She will be reevaluated in 1 week.  If there is no clear improvement, will start Darzalex at that time.   2.  Hypokalemia: - Continue OTC potassium.  Potassium is normal today.   3.  Normocytic anemia: - Bone marrow infiltration by myeloma.  Hemoglobin is 8.1.  No transfusion needed.   4.  ID prophylaxis: - Continue acyclovir twice daily.  Hold aspirin 81 mg daily.   5.  Myeloma bone disease: - Continue calcium supplements and monthly denosumab.   Orders placed this encounter:  No orders of the defined types were placed in this encounter.    Derek Jack, MD [place] [phone]   I, Thana Ates, am acting as a scribe for Dr. Derek Jack.  I, Derek Jack MD, have reviewed the above documentation for accuracy and completeness, and I agree with the above.

## 2021-10-13 NOTE — Patient Instructions (Signed)
[facility]  Discharge Instructions: Thank you for choosing [facility] to provide your oncology and hematology care.  If you have a lab appointment with the [facility], please come in thru the Main Entrance and check in at the main information desk.  Wear comfortable clothing and clothing appropriate for easy access to any Portacath or PICC line.   We strive to give you quality time with your provider. You may need to reschedule your appointment if you arrive late (15 or more minutes).  Arriving late affects you and other patients whose appointments are after yours.  Also, if you miss three or more appointments without notifying the office, you may be dismissed from the clinic at the provider's discretion.      For prescription refill requests, have your pharmacy contact our office and allow 72 hours for refills to be completed.    You received one unit of platelets today per MD orders.    To help prevent nausea and vomiting after your treatment, we encourage you to take your nausea medication as directed.  BELOW ARE SYMPTOMS THAT SHOULD BE REPORTED IMMEDIATELY: *FEVER GREATER THAN 100.4 F (38 C) OR HIGHER *CHILLS OR SWEATING *NAUSEA AND VOMITING THAT IS NOT CONTROLLED WITH YOUR NAUSEA MEDICATION *UNUSUAL SHORTNESS OF BREATH *UNUSUAL BRUISING OR BLEEDING *URINARY PROBLEMS (pain or burning when urinating, or frequent urination) *BOWEL PROBLEMS (unusual diarrhea, constipation, pain near the anus) TENDERNESS IN MOUTH AND THROAT WITH OR WITHOUT PRESENCE OF ULCERS (sore throat, sores in mouth, or a toothache) UNUSUAL RASH, SWELLING OR PAIN  UNUSUAL VAGINAL DISCHARGE OR ITCHING   Items with * indicate a potential emergency and should be followed up as soon as possible or go to the Emergency Department if any problems should occur.  Please show the CHEMOTHERAPY ALERT CARD or IMMUNOTHERAPY ALERT CARD at check-in to the Emergency Department and triage  nurse.  Should you have questions after your visit or need to cancel or reschedule your appointment, please contact [facility] [phone]  and follow the prompts.  Office hours are 8:00 a.m. to 4:30 p.m. Monday - Friday. Please note that voicemails left after 4:00 p.m. may not be returned until the following business day.  We are closed weekends and major holidays. You have access to a nurse at all times for urgent questions. Please call the main number to the clinic [phone] and follow the prompts.  For any non-urgent questions, you may also contact your provider using MyChart. We now offer e-Visits for anyone 16 and older to request care online for non-urgent symptoms. For details visit [email or web address].   Also download the MyChart app! Go to the app store, search "MyChart", open the app, select [facility], and log in with your MyChart username and password.  Masks are optional in the cancer centers. If you would like for your care team to wear a mask while they are taking care of you, please let them know. For doctor visits, patients may have with them one support person who is at least 80 years old. At this time, visitors are not allowed in the infusion area.

## 2021-10-13 NOTE — Progress Notes (Unsigned)
One unit of platelets given per orders. Patient tolerated it well without problems. Vitals stable and discharged home from clinic via wheelchair. Follow up as scheduled.

## 2021-10-13 NOTE — Patient Instructions (Addendum)
[place] at [facility] Discharge Instructions   You were seen and examined today by Dr. Delton Coombes.  He reviewed the results of your lab work. Your platelet count was unable to be determined because the cells clumped. We plan to give you platelets today due to your recent history of nose bleeding.   We will arrange for you to have blood checked on Thursday afternoon. If needed, we will give you blood and/or platelets on Friday.   Return as scheduled.    Thank you for choosing [place] at [facility] to provide your oncology and hematology care.  To afford each patient quality time with our provider, please arrive at least 15 minutes before your scheduled appointment time.   If you have a lab appointment with the [facility] please come in thru the Main Entrance and check in at the main information desk.  You need to re-schedule your appointment should you arrive 10 or more minutes late.  We strive to give you quality time with our providers, and arriving late affects you and other patients whose appointments are after yours.  Also, if you no show three or more times for appointments you may be dismissed from the clinic at the providers discretion.     Again, thank you for choosing [facility].  Our hope is that these requests will decrease the amount of time that you wait before being seen by our physicians.       _____________________________________________________________  Should you have questions after your visit to [facility], please contact our office at [phone] and follow the prompts.  Our office hours are 8:00 a.m. and 4:30 p.m. Monday - Friday.  Please note that voicemails left after 4:00 p.m. may not be returned until the following business day.  We are closed weekends and major holidays.  You do have access to a nurse 24-7, just call the main number to the clinic [phone] and do not press any  options, hold on the line and a nurse will answer the phone.    For prescription refill requests, have your pharmacy contact our office and allow 72 hours.    Due to Covid, you will need to wear a mask upon entering the hospital. If you do not have a mask, a mask will be given to you at the Main Entrance upon arrival. For doctor visits, patients may have 1 support person age 35 or older with them. For treatment visits, patients can not have anyone with them due to social distancing guidelines and our immunocompromised population.

## 2021-10-14 ENCOUNTER — Other Ambulatory Visit: Payer: Self-pay

## 2021-10-14 DIAGNOSIS — C9000 Multiple myeloma not having achieved remission: Secondary | ICD-10-CM

## 2021-10-14 LAB — BPAM PLATELET PHERESIS
Blood Product Expiration Date: 202308092359
ISSUE DATE / TIME: 202308080847
Unit Type and Rh: 7300

## 2021-10-14 LAB — PREPARE PLATELET PHERESIS: Unit division: 0

## 2021-10-14 MED ORDER — POMALIDOMIDE 2 MG PO CAPS: 2.0000 mg | ORAL_CAPSULE | Freq: Every day | ORAL | 0 refills | Status: DC

## 2021-10-14 NOTE — Telephone Encounter (Signed)
Chart reviewed. Pomalyst refilled per last office note with Dr. Katragadda.  

## 2021-10-15 ENCOUNTER — Inpatient Hospital Stay: Payer: Medicare Other

## 2021-10-15 ENCOUNTER — Encounter (HOSPITAL_COMMUNITY): Payer: Self-pay | Admitting: Hematology

## 2021-10-15 ENCOUNTER — Encounter: Payer: Self-pay | Admitting: Hematology

## 2021-10-15 DIAGNOSIS — C9000 Multiple myeloma not having achieved remission: Secondary | ICD-10-CM

## 2021-10-15 DIAGNOSIS — Z79899 Other long term (current) drug therapy: Secondary | ICD-10-CM | POA: Diagnosis not present

## 2021-10-15 DIAGNOSIS — Z5112 Encounter for antineoplastic immunotherapy: Secondary | ICD-10-CM | POA: Diagnosis not present

## 2021-10-15 DIAGNOSIS — C9002 Multiple myeloma in relapse: Secondary | ICD-10-CM | POA: Diagnosis not present

## 2021-10-15 LAB — CBC WITH DIFFERENTIAL/PLATELET
Abs Immature Granulocytes: 0 10*3/uL (ref 0.00–0.07)
Basophils Absolute: 0 10*3/uL (ref 0.0–0.1)
Basophils Relative: 0 %
Eosinophils Absolute: 0 10*3/uL (ref 0.0–0.5)
Eosinophils Relative: 1 %
HCT: 23.8 % — ABNORMAL LOW (ref 36.0–46.0)
Hemoglobin: 7.5 g/dL — ABNORMAL LOW (ref 12.0–15.0)
Immature Granulocytes: 0 %
Lymphocytes Relative: 41 %
Lymphs Abs: 0.7 10*3/uL (ref 0.7–4.0)
MCH: 29 pg (ref 26.0–34.0)
MCHC: 31.5 g/dL (ref 30.0–36.0)
MCV: 91.9 fL (ref 80.0–100.0)
Monocytes Absolute: 0.2 10*3/uL (ref 0.1–1.0)
Monocytes Relative: 11 %
Neutro Abs: 0.8 10*3/uL — ABNORMAL LOW (ref 1.7–7.7)
Neutrophils Relative %: 47 %
Platelets: 33 10*3/uL — ABNORMAL LOW (ref 150–400)
RBC: 2.59 MIL/uL — ABNORMAL LOW (ref 3.87–5.11)
RDW: 16.9 % — ABNORMAL HIGH (ref 11.5–15.5)
WBC: 1.7 10*3/uL — ABNORMAL LOW (ref 4.0–10.5)
nRBC: 0 % (ref 0.0–0.2)

## 2021-10-15 LAB — SAMPLE TO BLOOD BANK

## 2021-10-15 NOTE — Progress Notes (Unsigned)
Platelets 33 and HGB 7.5. Called and spoke with patient and patient's daughter [name]. No blood products needed tomorrow. Patient denies any nose bleeds or drainage in the back of the throat. Patient states she feels well per patient's words. Informed daughter of new appointment time for Wednesdsay Aug 15 for poss blood products @ 11:30 am. Patient has appointment Tuesday for blood work and f/u with Dr. Delton Coombes. Understanding verbalized.

## 2021-10-16 ENCOUNTER — Inpatient Hospital Stay: Payer: Medicare Other

## 2021-10-20 ENCOUNTER — Inpatient Hospital Stay: Payer: Medicare Other

## 2021-10-20 ENCOUNTER — Inpatient Hospital Stay (HOSPITAL_BASED_OUTPATIENT_CLINIC_OR_DEPARTMENT_OTHER): Payer: Medicare Other | Admitting: Hematology

## 2021-10-20 VITALS — BP 103/50 | HR 96 | Temp 98.0°F | Resp 18 | Ht 62.0 in | Wt 123.2 lb

## 2021-10-20 VITALS — BP 99/48 | HR 78 | Temp 97.6°F | Resp 16

## 2021-10-20 DIAGNOSIS — C9000 Multiple myeloma not having achieved remission: Secondary | ICD-10-CM

## 2021-10-20 DIAGNOSIS — Z79899 Other long term (current) drug therapy: Secondary | ICD-10-CM | POA: Diagnosis not present

## 2021-10-20 DIAGNOSIS — C9002 Multiple myeloma in relapse: Secondary | ICD-10-CM | POA: Diagnosis not present

## 2021-10-20 DIAGNOSIS — Z5112 Encounter for antineoplastic immunotherapy: Secondary | ICD-10-CM | POA: Diagnosis not present

## 2021-10-20 LAB — CBC WITH DIFFERENTIAL/PLATELET
Abs Immature Granulocytes: 0.01 10*3/uL (ref 0.00–0.07)
Basophils Absolute: 0 10*3/uL (ref 0.0–0.1)
Basophils Relative: 0 %
Eosinophils Absolute: 0 10*3/uL (ref 0.0–0.5)
Eosinophils Relative: 1 %
HCT: 18.5 % — ABNORMAL LOW (ref 36.0–46.0)
Hemoglobin: 5.9 g/dL — CL (ref 12.0–15.0)
Immature Granulocytes: 1 %
Lymphocytes Relative: 75 %
Lymphs Abs: 1.1 10*3/uL (ref 0.7–4.0)
MCH: 29.2 pg (ref 26.0–34.0)
MCHC: 31.9 g/dL (ref 30.0–36.0)
MCV: 91.6 fL (ref 80.0–100.0)
Monocytes Absolute: 0.1 10*3/uL (ref 0.1–1.0)
Monocytes Relative: 9 %
Neutro Abs: 0.2 10*3/uL — CL (ref 1.7–7.7)
Neutrophils Relative %: 14 %
Platelets: 19 10*3/uL — CL (ref 150–400)
RBC: 2.02 MIL/uL — ABNORMAL LOW (ref 3.87–5.11)
RDW: 17.2 % — ABNORMAL HIGH (ref 11.5–15.5)
WBC: 1.5 10*3/uL — ABNORMAL LOW (ref 4.0–10.5)
nRBC: 0 % (ref 0.0–0.2)

## 2021-10-20 LAB — COMPREHENSIVE METABOLIC PANEL
ALT: 21 U/L (ref 0–44)
AST: 16 U/L (ref 15–41)
Albumin: 2.9 g/dL — ABNORMAL LOW (ref 3.5–5.0)
Alkaline Phosphatase: 96 U/L (ref 38–126)
Anion gap: 8 (ref 5–15)
BUN: 21 mg/dL (ref 8–23)
CO2: 28 mmol/L (ref 22–32)
Calcium: 8.6 mg/dL — ABNORMAL LOW (ref 8.9–10.3)
Chloride: 98 mmol/L (ref 98–111)
Creatinine, Ser: 0.72 mg/dL (ref 0.44–1.00)
GFR, Estimated: >60 mL/min (ref >60)
Glucose, Bld: 142 mg/dL — ABNORMAL HIGH (ref 70–99)
Potassium: 3.7 mmol/L (ref 3.5–5.1)
Sodium: 134 mmol/L — ABNORMAL LOW (ref 135–145)
Total Bilirubin: 0.5 mg/dL (ref 0.3–1.2)
Total Protein: 5.8 g/dL — ABNORMAL LOW (ref 6.5–8.1)

## 2021-10-20 LAB — PREPARE RBC (CROSSMATCH)

## 2021-10-20 LAB — SAMPLE TO BLOOD BANK

## 2021-10-20 LAB — IRON AND TIBC
Iron: 73 ug/dL (ref 28–170)
Saturation Ratios: 27 % (ref 10.4–31.8)
TIBC: 270 ug/dL (ref 250–450)
UIBC: 197 ug/dL

## 2021-10-20 LAB — LACTATE DEHYDROGENASE: LDH: 106 U/L (ref 98–192)

## 2021-10-20 LAB — FERRITIN: Ferritin: 531 ng/mL — ABNORMAL HIGH (ref 11–307)

## 2021-10-20 LAB — MAGNESIUM: Magnesium: 1.9 mg/dL (ref 1.7–2.4)

## 2021-10-20 MED ORDER — SODIUM CHLORIDE 0.9% IV SOLUTION
250.0000 mL | Freq: Once | INTRAVENOUS | Status: AC
  Administered 2021-10-20: 250 mL via INTRAVENOUS

## 2021-10-20 MED ORDER — DIPHENHYDRAMINE HCL 25 MG PO CAPS
25.0000 mg | ORAL_CAPSULE | Freq: Once | ORAL | Status: AC
  Administered 2021-10-20: 25 mg via ORAL
  Filled 2021-10-20: qty 1

## 2021-10-20 MED ORDER — ACETAMINOPHEN 325 MG PO TABS
650.0000 mg | ORAL_TABLET | Freq: Once | ORAL | Status: AC
  Administered 2021-10-20: 650 mg via ORAL
  Filled 2021-10-20: qty 2

## 2021-10-20 NOTE — Patient Instructions (Signed)
[place] at [facility] Discharge Instructions   You were seen and examined today by Dr. Delton Coombes.  He reviewed the results of your lab work. Your hemoglobin is 5.9 and your platelet count is 19. We will plan to give you platelets today and give you blood tomorrow.   Continue Pomalyst and dexamethasone as prescribed.   Return as scheduled.    Thank you for choosing [place] at [facility] to provide your oncology and hematology care.  To afford each patient quality time with our provider, please arrive at least 15 minutes before your scheduled appointment time.   If you have a lab appointment with the [facility] please come in thru the Main Entrance and check in at the main information desk.  You need to re-schedule your appointment should you arrive 10 or more minutes late.  We strive to give you quality time with our providers, and arriving late affects you and other patients whose appointments are after yours.  Also, if you no show three or more times for appointments you may be dismissed from the clinic at the providers discretion.     Again, thank you for choosing [facility].  Our hope is that these requests will decrease the amount of time that you wait before being seen by our physicians.       _____________________________________________________________  Should you have questions after your visit to [facility], please contact our office at [phone] and follow the prompts.  Our office hours are 8:00 a.m. and 4:30 p.m. Monday - Friday.  Please note that voicemails left after 4:00 p.m. may not be returned until the following business day.  We are closed weekends and major holidays.  You do have access to a nurse 24-7, just call the main number to the clinic [phone] and do not press any options, hold on the line and a nurse will answer the phone.    For prescription refill requests, have your  pharmacy contact our office and allow 72 hours.    Due to Covid, you will need to wear a mask upon entering the hospital. If you do not have a mask, a mask will be given to you at the Main Entrance upon arrival. For doctor visits, patients may have 1 support person age 26 or older with them. For treatment visits, patients can not have anyone with them due to social distancing guidelines and our immunocompromised population.

## 2021-10-20 NOTE — Progress Notes (Signed)
Patient tolerated transfusion with no complaints voiced.  Side effects with management reviewed with understanding verbalized.  Port site clean and dry with no bruising or swelling noted at site.  Good blood return noted before and after administration.  Band aid applied.  Patient left in satisfactory condition with VSS and no s/s of distress noted.   ?

## 2021-10-20 NOTE — Progress Notes (Signed)
CRITICAL VALUE ALERT Critical value received:  hgb 5.9 Date of notification:  10-20-21 Time of notification: 0839 Critical value read back:  Yes.   Nurse who received alert:  C. Maddyx Wieck RN MD notified time and response:  Dr. Raliegh Ip 0840   CRITICAL VALUE ALERT Critical value received:  platelets 19, ANC 0.2 Date of notification:  10/20/21 Time of notification: 0853 Critical value read back:  Yes.   Nurse who received alert:  C. Ondra Deboard RN MD notified time and response:  [phone], Dr. Delton Coombes

## 2021-10-20 NOTE — Patient Instructions (Signed)
[facility]  Discharge Instructions: Thank you for choosing [facility] to provide your oncology and hematology care.  If you have a lab appointment with the Cancer Center, please come in thru the Main Entrance and check in at the main information desk.  Wear comfortable clothing and clothing appropriate for easy access to any Portacath or PICC line.   We strive to give you quality time with your provider. You may need to reschedule your appointment if you arrive late (15 or more minutes).  Arriving late affects you and other patients whose appointments are after yours.  Also, if you miss three or more appointments without notifying the office, you may be dismissed from the clinic at the provider's discretion.      For prescription refill requests, have your pharmacy contact our office and allow 72 hours for refills to be completed.      To help prevent nausea and vomiting after your treatment, we encourage you to take your nausea medication as directed.  BELOW ARE SYMPTOMS THAT SHOULD BE REPORTED IMMEDIATELY: *FEVER GREATER THAN 100.4 F (38 C) OR HIGHER *CHILLS OR SWEATING *NAUSEA AND VOMITING THAT IS NOT CONTROLLED WITH YOUR NAUSEA MEDICATION *UNUSUAL SHORTNESS OF BREATH *UNUSUAL BRUISING OR BLEEDING *URINARY PROBLEMS (pain or burning when urinating, or frequent urination) *BOWEL PROBLEMS (unusual diarrhea, constipation, pain near the anus) TENDERNESS IN MOUTH AND THROAT WITH OR WITHOUT PRESENCE OF ULCERS (sore throat, sores in mouth, or a toothache) UNUSUAL RASH, SWELLING OR PAIN  UNUSUAL VAGINAL DISCHARGE OR ITCHING   Items with * indicate a potential emergency and should be followed up as soon as possible or go to the Emergency Department if any problems should occur.  Please show the CHEMOTHERAPY ALERT CARD or IMMUNOTHERAPY ALERT CARD at check-in to the Emergency Department and triage nurse.  Should you have questions after your visit or need to  cancel or reschedule your appointment, please contact [facility] [phone]  and follow the prompts.  Office hours are 8:00 a.m. to 4:30 p.m. Monday - Friday. Please note that voicemails left after 4:00 p.m. may not be returned until the following business day.  We are closed weekends and major holidays. You have access to a nurse at all times for urgent questions. Please call the main number to the clinic [phone] and follow the prompts.  For any non-urgent questions, you may also contact your provider using MyChart. We now offer e-Visits for anyone 18 and older to request care online for non-urgent symptoms. For details visit [email or web address].   Also download the MyChart app! Go to the app store, search "MyChart", open the app, select [facility], and log in with your MyChart username and password.  Masks are optional in the cancer centers. If you would like for your care team to wear a mask while they are taking care of you, please let them know. For doctor visits, patients may have with them one support person who is at least 80 years old. At this time, visitors are not allowed in the infusion area.  

## 2021-10-20 NOTE — Progress Notes (Signed)
Will give one unit of platelets today and 2 units of blood tomorrow per MD.

## 2021-10-21 ENCOUNTER — Inpatient Hospital Stay: Payer: Medicare Other

## 2021-10-21 VITALS — BP 145/60 | HR 61 | Temp 97.6°F | Resp 18

## 2021-10-21 DIAGNOSIS — C9000 Multiple myeloma not having achieved remission: Secondary | ICD-10-CM

## 2021-10-21 DIAGNOSIS — Z79899 Other long term (current) drug therapy: Secondary | ICD-10-CM | POA: Diagnosis not present

## 2021-10-21 DIAGNOSIS — Z5112 Encounter for antineoplastic immunotherapy: Secondary | ICD-10-CM | POA: Diagnosis not present

## 2021-10-21 DIAGNOSIS — C9002 Multiple myeloma in relapse: Secondary | ICD-10-CM | POA: Diagnosis not present

## 2021-10-21 LAB — PREPARE PLATELET PHERESIS: Unit division: 0

## 2021-10-21 LAB — BPAM PLATELET PHERESIS
Blood Product Expiration Date: 202308162359
ISSUE DATE / TIME: 202308151012
Unit Type and Rh: 5100

## 2021-10-21 LAB — KAPPA/LAMBDA LIGHT CHAINS
Kappa free light chain: 30.6 mg/L — ABNORMAL HIGH (ref 3.3–19.4)
Kappa, lambda light chain ratio: 11.33 — ABNORMAL HIGH (ref 0.26–1.65)
Lambda free light chains: 2.7 mg/L — ABNORMAL LOW (ref 5.7–26.3)

## 2021-10-21 MED ORDER — DARATUMUMAB-HYALURONIDASE-FIHJ 1800-30000 MG-UT/15ML ~~LOC~~ SOLN
1800.0000 mg | Freq: Once | SUBCUTANEOUS | Status: AC
  Administered 2021-10-21: 1800 mg via SUBCUTANEOUS
  Filled 2021-10-21: qty 15

## 2021-10-21 MED ORDER — DEXAMETHASONE 4 MG PO TABS
20.0000 mg | ORAL_TABLET | Freq: Once | ORAL | Status: AC
  Administered 2021-10-21: 20 mg via ORAL
  Filled 2021-10-21: qty 5

## 2021-10-21 MED ORDER — ACETAMINOPHEN 325 MG PO TABS
ORAL_TABLET | ORAL | Status: AC
  Administered 2021-10-21: 650 mg
  Filled 2021-10-21: qty 2

## 2021-10-21 MED ORDER — MONTELUKAST SODIUM 10 MG PO TABS
10.0000 mg | ORAL_TABLET | Freq: Once | ORAL | Status: AC
  Administered 2021-10-21: 10 mg via ORAL
  Filled 2021-10-21: qty 1

## 2021-10-21 MED ORDER — DIPHENHYDRAMINE HCL 25 MG PO CAPS
50.0000 mg | ORAL_CAPSULE | Freq: Once | ORAL | Status: AC
  Filled 2021-10-21: qty 2

## 2021-10-21 MED ORDER — DIPHENHYDRAMINE HCL 25 MG PO CAPS
ORAL_CAPSULE | ORAL | Status: AC
  Administered 2021-10-21: 25 mg
  Filled 2021-10-21: qty 1

## 2021-10-21 MED ORDER — DENOSUMAB 120 MG/1.7ML ~~LOC~~ SOLN
120.0000 mg | Freq: Once | SUBCUTANEOUS | Status: AC
  Administered 2021-10-21: 120 mg via SUBCUTANEOUS
  Filled 2021-10-21: qty 1.7

## 2021-10-21 NOTE — Patient Instructions (Signed)
[facility]  Discharge Instructions: Thank you for choosing [facility] to provide your oncology and hematology care.  If you have a lab appointment with the [facility], please come in thru the Main Entrance and check in at the main information desk.  Wear comfortable clothing and clothing appropriate for easy access to any Portacath or PICC line.   We strive to give you quality time with your provider. You may need to reschedule your appointment if you arrive late (15 or more minutes).  Arriving late affects you and other patients whose appointments are after yours.  Also, if you miss three or more appointments without notifying the office, you may be dismissed from the clinic at the provider's discretion.      For prescription refill requests, have your pharmacy contact our office and allow 72 hours for refills to be completed.    Today you received the following chemo today. Darzalez SQ. 2 units of blood, xgeva   To help prevent nausea and vomiting after your treatment, we encourage you to take your nausea medication as directed.  BELOW ARE SYMPTOMS THAT SHOULD BE REPORTED IMMEDIATELY: *FEVER GREATER THAN 100.4 F (38 C) OR HIGHER *CHILLS OR SWEATING *NAUSEA AND VOMITING THAT IS NOT CONTROLLED WITH YOUR NAUSEA MEDICATION *UNUSUAL SHORTNESS OF BREATH *UNUSUAL BRUISING OR BLEEDING *URINARY PROBLEMS (pain or burning when urinating, or frequent urination) *BOWEL PROBLEMS (unusual diarrhea, constipation, pain near the anus) TENDERNESS IN MOUTH AND THROAT WITH OR WITHOUT PRESENCE OF ULCERS (sore throat, sores in mouth, or a toothache) UNUSUAL RASH, SWELLING OR PAIN  UNUSUAL VAGINAL DISCHARGE OR ITCHING   Items with * indicate a potential emergency and should be followed up as soon as possible or go to the Emergency Department if any problems should occur.  Please show the CHEMOTHERAPY ALERT CARD or IMMUNOTHERAPY ALERT CARD at check-in to the Emergency  Department and triage nurse.  Should you have questions after your visit or need to cancel or reschedule your appointment, please contact [facility] [phone]  and follow the prompts.  Office hours are 8:00 a.m. to 4:30 p.m. Monday - Friday. Please note that voicemails left after 4:00 p.m. may not be returned until the following business day.  We are closed weekends and major holidays. You have access to a nurse at all times for urgent questions. Please call the main number to the clinic [phone] and follow the prompts.  For any non-urgent questions, you may also contact your provider using MyChart. We now offer e-Visits for anyone 58 and older to request care online for non-urgent symptoms. For details visit [email or web address].   Also download the MyChart app! Go to the app store, search "MyChart", open the app, select [facility], and log in with your MyChart username and password.  Masks are optional in the cancer centers. If you would like for your care team to wear a mask while they are taking care of you, please let them know. You may have one support person who is at least 80 years old accompany you for your appointments.

## 2021-10-21 NOTE — Progress Notes (Signed)
UNMATCHED BLOOD PRODUCT NOTE  Compare the patient ID on the blood tag to the patient ID on the hospital armband and Blood Bank armband. Then confirm the unit number on the blood tag matches the unit number on the blood product.  If a discrepancy is discovered return the product to blood bank immediately. Unit checked by Ronne Binning RN, Barnetta Chapel Jery Hollern RN  Blood Product Type: Packed Red Blood Cells  Unit #: (Found on blood product bag, begins with W) [id number]  Product Code #: (Found on blood product bag, begins with E) [id number]   Start Time: 1210  Starting Rate: 120 ml/hr  Rate increase/decreased  (if applicable):     509 ml/hr  Rate changed time (if applicable): 3267   Stop Time: 1330   All Other Documentation should be documented within the Blood Admin Flowsheet per policy.

## 2021-10-21 NOTE — Progress Notes (Signed)
Labs reviewed at office visit earlier this week. Ok to treat today per MD.   Patient took '650mg'$  PO Tylenol for blood today.

## 2021-10-21 NOTE — Progress Notes (Signed)
Stop time 13:33 pm. Unit 1

## 2021-10-22 LAB — BPAM RBC
Blood Product Expiration Date: 202309082359
Blood Product Expiration Date: 202309202359
ISSUE DATE / TIME: 202308161201
Unit Type and Rh: 5100
Unit Type and Rh: 5100

## 2021-10-22 LAB — TYPE AND SCREEN
ABO/RH(D): O POS
Antibody Screen: POSITIVE
Donor AG Type: NEGATIVE
Donor AG Type: NEGATIVE
Unit division: 0
Unit division: 0

## 2021-10-23 ENCOUNTER — Encounter: Payer: Self-pay | Admitting: Hematology

## 2021-10-23 ENCOUNTER — Encounter (HOSPITAL_COMMUNITY): Payer: Self-pay | Admitting: Hematology

## 2021-10-23 LAB — PROTEIN ELECTROPHORESIS, SERUM
A/G Ratio: 1.1 (ref 0.7–1.7)
Albumin ELP: 2.7 g/dL — ABNORMAL LOW (ref 2.9–4.4)
Alpha-1-Globulin: 0.3 g/dL (ref 0.0–0.4)
Alpha-2-Globulin: 0.7 g/dL (ref 0.4–1.0)
Beta Globulin: 1.3 g/dL (ref 0.7–1.3)
Gamma Globulin: 0.1 g/dL — ABNORMAL LOW (ref 0.4–1.8)
Globulin, Total: 2.5 g/dL (ref 2.2–3.9)
M-Spike, %: 0.6 g/dL — ABNORMAL HIGH
Total Protein ELP: 5.2 g/dL — ABNORMAL LOW (ref 6.0–8.5)

## 2021-10-23 NOTE — Progress Notes (Signed)
[place], [place] [address]   CLINIC:  Medical Oncology/Hematology  PCP:  [name], MD [address] / [place] [address] [phone]   REASON FOR VISIT:  Follow-up for IgA kappa plasma cell myeloma  PRIOR THERAPY: Velcade x 9 cycles from 07/04/2019 to 01/10/2020  CURRENT THERAPY: Revlimid 15 mg 2/3 weeks; Xgeva monthly  BRIEF ONCOLOGIC HISTORY:  Oncology History  Multiple myeloma not having achieved remission ([place])  06/07/2019 Initial Diagnosis   Multiple myeloma not having achieved remission ([facility])   07/04/2019 - 01/10/2020 Chemotherapy   The patient had dexamethasone (DECADRON) 4 MG tablet, 1 of 1 cycle, Start date: 01/10/2020, End date: -- lenalidomide (REVLIMID) 15 MG capsule, 1 of 1 cycle, Start date: 02/26/2020, End date: -- bortezomib SQ (VELCADE) chemo injection 2.25 mg, 1.3 mg/m2 = 2.25 mg, Subcutaneous,  Once, 9 of 10 cycles Administration: 2.25 mg (07/04/2019), 2.25 mg (07/11/2019), 2.25 mg (07/26/2019), 2.25 mg (08/02/2019), 2.25 mg (08/09/2019), 2.25 mg (08/16/2019), 2.25 mg (08/23/2019), 2.25 mg (08/30/2019), 2.25 mg (09/06/2019), 2.25 mg (09/13/2019), 2.25 mg (09/20/2019), 2.25 mg (09/27/2019), 2.25 mg (10/04/2019), 2.25 mg (10/11/2019), 2.25 mg (10/18/2019), 2.25 mg (10/25/2019), 2.25 mg (11/01/2019), 2.25 mg (11/08/2019), 2.25 mg (11/15/2019), 2.25 mg (11/22/2019), 2.25 mg (11/29/2019), 2.25 mg (12/06/2019), 2.25 mg (12/13/2019), 2.25 mg (12/20/2019), 2.25 mg (12/27/2019), 2.25 mg (01/03/2020), 2.25 mg (01/10/2020)  for chemotherapy treatment.    09/15/2021 -  Chemotherapy   Patient is on Treatment Plan : MYELOMA RELAPSED REFRACTORY Daratumumab SQ + Pomalidomide + Dexamethasone (DaraPd) q28d       CANCER STAGING:  Cancer Staging  No matching staging information was found for the patient.  INTERVAL HISTORY:  Courtney Arnold, a 81 y.o. female, seen for follow-up of multiple myeloma.  She reported nosebleed this morning.  No soreness in  the throat.  Hearing is getting better.  She is taking dexamethasone 10 mg on Tuesday and Wednesday.  She is also continuing Pomalyst 2 mg tablets daily.  She is using Afrin for nosebleeds.   REVIEW OF SYSTEMS:  Review of Systems  Constitutional:  Negative for appetite change and fatigue.  HENT:   Positive for nosebleeds.   Respiratory:  Negative for cough.   Gastrointestinal:  Negative for blood in stool.  All other systems reviewed and are negative.   PAST MEDICAL/SURGICAL HISTORY:  Past Medical History:  Diagnosis Date   Atrial fibrillation ([facility]) 2011   Postop, spontaneous conversion to normal sinus after one hour   Compression fracture 07/24/2009   T12; kyphoplasty   History of echocardiogram 07/2009   EF 65%   Hypertension    Thyroid disease    Tobacco abuse    Past Surgical History:  Procedure Laterality Date   BACK SURGERY     BACK SURGERY  06/06/2015   BREAST EXCISIONAL BIOPSY Left    50 years ago  benign   CHOLECYSTECTOMY N/A 04/25/2015   Procedure: LAPAROSCOPIC CHOLECYSTECTOMY WITH INTRAOPERATIVE CHOLANGIOGRAM;  Surgeon: Mickeal Skinner, MD;  Location: [facility];  Service: General;  Laterality: N/A;   COLONOSCOPY N/A 11/26/2015   Procedure: COLONOSCOPY;  Surgeon: Daneil Dolin, MD;  Location: [facility];  Service: Endoscopy;  Laterality: N/A;  7:30 am   ERCP N/A 04/14/2015   Procedure: ENDOSCOPIC RETROGRADE CHOLANGIOPANCREATOGRAPHY (ERCP) Biliary Sphincterotomy, 10x7 stent placement Dilated bilary system just not well seen;  Surgeon: Rogene Houston, MD;  Location: [facility];  Service: Endoscopy;  Laterality: N/A;   ERCP N/A 06/12/2015  Procedure: ENDOSCOPIC RETROGRADE CHOLANGIOPANCREATOGRAPHY (ERCP);  Surgeon: Rogene Houston, MD;  [facility]: [facility];  Service: Endoscopy;  Laterality: N/A;   ESOPHAGOGASTRODUODENOSCOPY N/A 06/12/2015   Procedure: DIAGNOSTIC ESOPHAGOGASTRODUODENOSCOPY (EGD);  Surgeon: Rogene Houston, MD;  [facility];  Service: Endoscopy;   Laterality: N/A;   FEMUR IM NAIL Right 05/11/2019   Procedure: RETROGRADE INTRAMEDULLARY NAIL FEMORAL;  Surgeon: Shona Needles, MD;  Location: [facility];  Service: Orthopedics;  Laterality: Right;   STENT REMOVAL  06/12/2015   Procedure: STENT REMOVAL ;  Surgeon: Rogene Houston, MD;  [facility]: [facility];  Service: Endoscopy;;    SOCIAL HISTORY:  Social History   Socioeconomic History   Marital status: Widowed    Spouse name: Not on file   Number of children: 2   Years of education: Not on file   Highest education level: Not on file  Occupational History   Occupation: retired  Tobacco Use   Smoking status: Former    Packs/day: 0.15    Years: 20.00    Total pack years: 3.00    Types: Cigarettes    Quit date: 03/08/2013    Years since quitting: 8.6   Smokeless tobacco: Never  Vaping Use   Vaping Use: Never used  Substance and Sexual Activity   Alcohol use: No    Alcohol/week: 0.0 standard drinks of alcohol   Drug use: No   Sexual activity: Not Currently  Other Topics Concern   Not on file  Social History Narrative   Active in gardens and does yard work.   Social Determinants of Health   Financial Resource Strain: Low Risk  (05/08/2019)   Overall Financial Resource Strain (CARDIA)    Difficulty of Paying Living Expenses: Not hard at all  Food Insecurity: No Food Insecurity (12/28/2019)   Hunger Vital Sign    Worried About Running Out of Food in the Last Year: Never true    Ran Out of Food in the Last Year: Never true  Transportation Needs: No Transportation Needs (06/14/2019)   PRAPARE - Hydrologist (Medical): No    Lack of Transportation (Non-Medical): No  Physical Activity: Inactive (05/08/2019)   Exercise Vital Sign    Days of Exercise per Week: 0 days    Minutes of Exercise per Session: 0 min  Stress: No Stress Concern Present (05/08/2019)   [place]    Feeling of Stress :  Not at all  Social Connections: Moderately Isolated (02/06/2020)   Social Connection and Isolation Panel [NHANES]    Frequency of Communication with Friends and Family: More than three times a week    Frequency of Social Gatherings with Friends and Family: More than three times a week    Attends Religious Services: More than 4 times per year    Active Member of [employer] or Organizations: No    Attends Archivist Meetings: Never    Marital Status: Widowed  Intimate Partner Violence: Not At Risk (05/08/2019)   Humiliation, Afraid, Rape, and Kick questionnaire    Fear of Current or Ex-Partner: No    Emotionally Abused: No    Physically Abused: No    Sexually Abused: No    FAMILY HISTORY:  Family History  Problem Relation Age of Onset   Heart attack Father    Stroke Father    Breast cancer Sister    Thyroid disease Neg Hx    Colon cancer  Neg Hx     CURRENT MEDICATIONS:  Current Outpatient Medications  Medication Sig Dispense Refill   acyclovir (ZOVIRAX) 400 MG tablet TAKE (1) TABLET BY MOUTH TWICE DAILY. (Patient taking differently: Take 400 mg by mouth 2 (two) times daily.) 60 tablet 6   CALCIUM PO Take 1 capsule by mouth daily.     carboxymethylcellulose (REFRESH PLUS) 0.5 % SOLN Place 1 drop into both eyes in the morning, at noon, in the evening, and at bedtime.     Cholecalciferol (VITAMIN D) 125 MCG (5000 UT) CAPS Take 5,000 Units by mouth daily. 90 capsule 1   Cyanocobalamin (B-12 PO) Take 1 capsule by mouth daily.     feeding supplement (ENSURE ENLIVE / ENSURE PLUS) LIQD Take 237 mLs by mouth 2 (two) times daily between meals. 14220 mL 1   Ferrous Sulfate (IRON PO) Take 1 capsule by mouth daily.     levothyroxine (SYNTHROID) 137 MCG tablet Take 1 tablet (137 mcg total) by mouth daily before breakfast. 90 tablet 3   Multiple Vitamin (MULTIVITAMIN WITH MINERALS) TABS tablet Take 1 tablet by mouth daily.     ondansetron (ZOFRAN) 8 MG tablet Take 1 tablet (8 mg total) by  mouth every 8 (eight) hours as needed for nausea or vomiting. 60 tablet 0   pomalidomide (POMALYST) 2 MG capsule Take 1 capsule (2 mg total) by mouth daily. Take for 21 days, then hold for 7 days. Repeat every 28 days. 21 capsule 0   potassium chloride SA (KLOR-CON M) 20 MEQ tablet Take 1 tablet (20 mEq total) by mouth as needed. Take Potassium 40 meq by mouth then wait three hours and take 20 meq by mouth and wait three hours and take 40 meq by mouth for a total of Potassium 100 meq. 5 tablet 1   POTASSIUM PO Take 1 capsule by mouth daily.     SSD 1 % cream Apply 1 Application topically daily.     No current facility-administered medications for this visit.   Facility-Administered Medications Ordered in Other Visits  Medication Dose Route Frequency Provider Last Rate Last Admin   0.9 %  sodium chloride infusion (Manually program via Guardrails IV Fluids)  250 mL Intravenous Once Derek Jack, MD       0.9 %  sodium chloride infusion   Intravenous Continuous Derek Jack, MD   Stopped at 09/15/21 1454   daratumumab-hyaluronidase-fihj (DARZALEX FASPRO) 1800-30000 MG-UT/15ML chemo SQ injection 1,800 mg  1,800 mg Subcutaneous Once Derek Jack, MD        ALLERGIES:  No Known Allergies  PHYSICAL EXAM:  Performance status (ECOG): 1 - Symptomatic but completely ambulatory  Vitals:   10/20/21 0831  BP: (!) 103/50  Pulse: 96  Resp: 18  Temp: 98 F (36.7 C)  SpO2: 95%   Wt Readings from Last 3 Encounters:  10/20/21 123 lb 3.2 oz (55.9 kg)  10/13/21 133 lb (60.3 kg)  09/27/21 134 lb 0.6 oz (60.8 kg)   Physical Exam Vitals reviewed.  Constitutional:      Appearance: Normal appearance.  Cardiovascular:     Rate and Rhythm: Normal rate and regular rhythm.     Pulses: Normal pulses.     Heart sounds: Normal heart sounds.  Pulmonary:     Effort: Pulmonary effort is normal.     Breath sounds: Normal breath sounds.  Neurological:     General: No focal deficit  present.     Mental Status: She is alert and oriented to person,  place, and time.  Psychiatric:        Mood and Affect: Mood normal.        Behavior: Behavior normal.   Blood in the back of the throat.  LABORATORY DATA:  I have reviewed the labs as listed.     Latest Ref Rng & Units 10/20/2021    8:02 AM 10/15/2021    8:36 AM 10/12/2021   11:06 AM  CBC  WBC 4.0 - 10.5 K/uL 1.5  1.7  1.3   Hemoglobin 12.0 - 15.0 g/dL 5.9  7.5  8.1   Hematocrit 36.0 - 46.0 % 18.5  23.8  25.8   Platelets 150 - 400 K/uL 19  33  DCLMP       Latest Ref Rng & Units 10/20/2021    8:02 AM 10/12/2021   11:06 AM 10/06/2021    8:21 AM  CMP  Glucose 70 - 99 mg/dL 142  120  112   BUN 8 - 23 mg/dL _0 Creatinine 0.44 - 1.00 mg/dL 0.72  0.78  0.68   Sodium 135 - 145 mmol/L 134  134  136   Potassium 3.5 - 5.1 mmol/L 3.7  3.6  4.0   Chloride 98 - 111 mmol/L 98  96  100   CO2 22 - 32 mmol/L _1 Calcium 8.9 - 10.3 mg/dL 8.6  9.0  8.4   Total Protein 6.5 - 8.1 g/dL 5.8  6.2  6.0   Total Bilirubin 0.3 - 1.2 mg/dL 0.5  0.6  0.8   Alkaline Phos 38 - 126 U/L 96  86  72   AST 15 - 41 U/L _2 ALT 0 - 44 U/L _3 DIAGNOSTIC IMAGING:  I have independently reviewed the scans and discussed with the patient. CT CHEST WO CONTRAST  Result Date: 09/27/2021 CLINICAL DATA:  Shortness of breath, tachypnea, recent pneumonia EXAM: CT CHEST WITHOUT CONTRAST TECHNIQUE: Multidetector CT imaging of the chest was performed following the standard protocol without IV contrast. RADIATION DOSE REDUCTION: This exam was performed according to the departmental dose-optimization program which includes automated exposure control, adjustment of the mA and/or kV according to patient size and/or use of iterative reconstruction technique. COMPARISON:  Chest radiograph dated 09/27/2021 and 09/22/2021. FINDINGS: Cardiovascular: The heart is normal in size. No pericardial effusion. No evidence of thoracic aortic  aneurysm. Atherosclerotic calcifications of the aortic arch. Three vessel coronary atherosclerosis. Mediastinum/Nodes: Small mediastinal lymph nodes as a 90 pathologic CT size criteria. Visualized thyroid is unremarkable. Lungs/Pleura: Consolidative patchy opacity in the left lung apex/left upper lobe with associated air bronchograms in the medial left upper lobe/lingula. Additional mild patchy opacity in the posterior right upper lobe. When coupled with the rapid progression on chest radiographs, this appearance is compatible with multifocal pneumonia. Small left pleural effusion. No pneumothorax. Upper Abdomen: Visualized upper abdomen is notable for mild vascular calcifications and a 3.2 cm anterior left upper pole renal cyst. Musculoskeletal: Multiple lytic lesions, including in the medial clavicles, sternum, and multiple thoracic vertebral bodies, compatible with the patient's known multiple myeloma. Prior vertebral augmentation at T5, T6, and T11. Severe compression fracture deformity at T9. IMPRESSION: Multifocal upper lobe pneumonia, left greater than right. Associated small left pleural effusion. Consider follow-up chest radiographs in 4-6 weeks to document clearance. Multiple lytic lesions, compatible with the patient's known multiple myeloma. Aortic Atherosclerosis (ICD10-I70.0). Electronically  Signed   By: Julian Hy M.D.   On: 09/27/2021 23:34   DG Chest Port 1 View  Result Date: 09/27/2021 CLINICAL DATA:  Nosebleed EXAM: PORTABLE CHEST 1 VIEW COMPARISON:  09/22/2021 FINDINGS: The heart size and mediastinal contours are within normal limits. Significant interval increase in heterogeneous and consolidative airspace opacity of the left apex. The visualized skeletal structures are unremarkable. IMPRESSION: Significant interval increase in heterogeneous and consolidative airspace opacity of the left apex, concerning for infection. Consider CT to further evaluate. Electronically Signed   By: Delanna Ahmadi M.D.   On: 09/27/2021 17:57     ASSESSMENT:  1.  IgA kappa plasma cell myeloma: -IgA kappa plasma cell myeloma diagnosed on right sacral bone biopsy on 05/17/2019.  SPEP did not show M spike.  Immunofixation shows IgA kappa.  Kappa light chains 42.8, lambda light chains at 9.9, ratio 4.32.  Beta-2 microglobulin 3.7. -PET scan on 06/04/2019 showed expansile lytic lesion involving the right sacral wing.  Lytic lesion involving T10 vertebral body.  Left seventh rib lesion.  Increased uptake in a pathological fracture involving distal aspect of the right femur. 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 -RVD started on 06/26/2019.  Revlimid 15 mg 2 weeks on/1 week off. -Velcade discontinued on 01/10/2020. -She is taking Revlimid 15 mg 2 weeks on 1 week off, with dexamethasone 20 mg on weeks of Revlimid and 10 mg while she is off Revlimid.  Revlimid dose further reduced to 10 mg 3 weeks on/1 week off.  She has developed progressive anemia. - BMBX (08/07/2021): 38% plasma cells in the marrow.  Cytogenetics was normal.  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 - DPD regimen started on, single agent Darzalex and dexamethasone started on 09/15/2021.  Daratumumab held after first treatment due to pneumonia. - Pomalidomide 2 mg 3 weeks on/1 week off and dexamethasone 20 mg weekly started on 10/06/2021.  Daratumumab added on 10/21/2021.   PLAN:  1.  Relapsed IgA kappa plasma cell myeloma: - She is tolerating pomalidomide 2 mg tablet daily very well.  She is taking dexamethasone 10 mg on Tuesday and Wednesday. - We have reviewed labs today which showed normal creatinine and calcium.  Hemoglobin is down to 5.9.  Platelets are 19.  White count is 1.5 with ANC of 0.2. - She has pancytopenia from bone marrow infiltration from myeloma.  Hence I have recommended adding daratumumab to the regimen of pomalidomide and dexamethasone to  increase response. - We will transfuse 1 unit of platelets and 2 units PRBC.  She will be evaluated again in 1 week with repeat labs.   2.  Hypokalemia: - Continue potassium daily.  Potassium today is normal.   3.  Normocytic anemia: - From bone marrow infiltration.  Hemoglobin today is 5.9.  Will transfuse 2 units PRBC tomorrow.   4.  ID prophylaxis: - Continue acyclovir twice daily.  Hold aspirin 81 mg daily.   5.  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   Orders placed this encounter:  No orders of the defined types were placed in this encounter.    Derek Jack, MD [place] [phone]   I, Thana Ates, am acting as a scribe for Dr. Derek Jack.  I, Derek Jack MD, have reviewed the above documentation for accuracy and completeness, and I agree with the above.

## 2021-10-26 LAB — IMMUNOFIXATION ELECTROPHORESIS
IgA: 654 mg/dL — ABNORMAL HIGH (ref 64–422)
IgG (Immunoglobin G), Serum: 163 mg/dL — ABNORMAL LOW (ref 586–1602)
IgM (Immunoglobulin M), Srm: 8 mg/dL — ABNORMAL LOW (ref 26–217)
Total Protein ELP: 5.3 g/dL — ABNORMAL LOW (ref 6.0–8.5)

## 2021-10-27 ENCOUNTER — Inpatient Hospital Stay: Payer: Medicare Other | Admitting: Hematology

## 2021-10-27 ENCOUNTER — Inpatient Hospital Stay: Payer: Medicare Other

## 2021-10-28 ENCOUNTER — Inpatient Hospital Stay: Payer: Medicare Other

## 2021-10-28 ENCOUNTER — Inpatient Hospital Stay (HOSPITAL_BASED_OUTPATIENT_CLINIC_OR_DEPARTMENT_OTHER): Payer: Medicare Other | Admitting: Hematology

## 2021-10-28 VITALS — BP 133/53 | HR 67 | Temp 98.3°F | Resp 17 | Ht 62.0 in | Wt 127.1 lb

## 2021-10-28 DIAGNOSIS — C9000 Multiple myeloma not having achieved remission: Secondary | ICD-10-CM | POA: Diagnosis not present

## 2021-10-28 DIAGNOSIS — C9002 Multiple myeloma in relapse: Secondary | ICD-10-CM | POA: Diagnosis not present

## 2021-10-28 DIAGNOSIS — Z5112 Encounter for antineoplastic immunotherapy: Secondary | ICD-10-CM | POA: Diagnosis not present

## 2021-10-28 DIAGNOSIS — D649 Anemia, unspecified: Secondary | ICD-10-CM | POA: Diagnosis not present

## 2021-10-28 DIAGNOSIS — Z79899 Other long term (current) drug therapy: Secondary | ICD-10-CM | POA: Diagnosis not present

## 2021-10-28 LAB — CBC WITH DIFFERENTIAL/PLATELET
Abs Immature Granulocytes: 0 10*3/uL (ref 0.00–0.07)
Basophils Absolute: 0 10*3/uL (ref 0.0–0.1)
Basophils Relative: 0 %
Eosinophils Absolute: 0 10*3/uL (ref 0.0–0.5)
Eosinophils Relative: 1 %
HCT: 23.6 % — ABNORMAL LOW (ref 36.0–46.0)
Hemoglobin: 7.7 g/dL — ABNORMAL LOW (ref 12.0–15.0)
Lymphocytes Relative: 65 %
Lymphs Abs: 0.8 10*3/uL (ref 0.7–4.0)
MCH: 29.4 pg (ref 26.0–34.0)
MCHC: 32.6 g/dL (ref 30.0–36.0)
MCV: 90.1 fL (ref 80.0–100.0)
Monocytes Absolute: 0.1 10*3/uL (ref 0.1–1.0)
Monocytes Relative: 6 %
Neutro Abs: 0.3 10*3/uL — CL (ref 1.7–7.7)
Neutrophils Relative %: 28 %
Platelets: 30 10*3/uL — ABNORMAL LOW (ref 150–400)
RBC: 2.62 MIL/uL — ABNORMAL LOW (ref 3.87–5.11)
RDW: 16.1 % — ABNORMAL HIGH (ref 11.5–15.5)
WBC: 1.2 10*3/uL — CL (ref 4.0–10.5)
nRBC: 0 % (ref 0.0–0.2)

## 2021-10-28 LAB — PREPARE RBC (CROSSMATCH)

## 2021-10-28 LAB — SAMPLE TO BLOOD BANK

## 2021-10-28 MED ORDER — DEXAMETHASONE 4 MG PO TABS
20.0000 mg | ORAL_TABLET | Freq: Once | ORAL | Status: AC
  Administered 2021-10-28: 20 mg via ORAL
  Filled 2021-10-28: qty 5

## 2021-10-28 MED ORDER — ACETAMINOPHEN 325 MG PO TABS
650.0000 mg | ORAL_TABLET | Freq: Once | ORAL | Status: AC
  Administered 2021-10-28: 650 mg via ORAL
  Filled 2021-10-28: qty 2

## 2021-10-28 MED ORDER — MONTELUKAST SODIUM 10 MG PO TABS
10.0000 mg | ORAL_TABLET | Freq: Once | ORAL | Status: AC
  Administered 2021-10-28: 10 mg via ORAL
  Filled 2021-10-28: qty 1

## 2021-10-28 MED ORDER — DIPHENHYDRAMINE HCL 25 MG PO CAPS
50.0000 mg | ORAL_CAPSULE | Freq: Once | ORAL | Status: AC
  Administered 2021-10-28: 50 mg via ORAL
  Filled 2021-10-28: qty 2

## 2021-10-28 MED ORDER — MONTELUKAST SODIUM 10 MG PO TABS: ORAL_TABLET | ORAL | 2 refills | Status: AC

## 2021-10-28 MED ORDER — DARATUMUMAB-HYALURONIDASE-FIHJ 1800-30000 MG-UT/15ML ~~LOC~~ SOLN
1800.0000 mg | Freq: Once | SUBCUTANEOUS | Status: AC
  Administered 2021-10-28: 1800 mg via SUBCUTANEOUS
  Filled 2021-10-28: qty 15

## 2021-10-28 NOTE — Progress Notes (Signed)
[facility], [place] [address]   CLINIC:  Medical Oncology/Hematology  PCP:  [name], MD [address] / [place] [address] [phone]   REASON FOR VISIT:  Follow-up for IgA kappa plasma cell myeloma  PRIOR THERAPY: Velcade x 9 cycles from 07/04/2019 to 01/10/2020  CURRENT THERAPY: Revlimid 15 mg 2/3 weeks, weekly Darzalex; Xgeva monthly  BRIEF ONCOLOGIC HISTORY:  Oncology History  Multiple myeloma not having achieved remission ([place])  06/07/2019 Initial Diagnosis   Multiple myeloma not having achieved remission ([place])   07/04/2019 - 01/10/2020 Chemotherapy   The patient had dexamethasone (DECADRON) 4 MG tablet, 1 of 1 cycle, Start date: 01/10/2020, End date: -- lenalidomide (REVLIMID) 15 MG capsule, 1 of 1 cycle, Start date: 02/26/2020, End date: -- bortezomib SQ (VELCADE) chemo injection 2.25 mg, 1.3 mg/m2 = 2.25 mg, Subcutaneous,  Once, 9 of 10 cycles Administration: 2.25 mg (07/04/2019), 2.25 mg (07/11/2019), 2.25 mg (07/26/2019), 2.25 mg (08/02/2019), 2.25 mg (08/09/2019), 2.25 mg (08/16/2019), 2.25 mg (08/23/2019), 2.25 mg (08/30/2019), 2.25 mg (09/06/2019), 2.25 mg (09/13/2019), 2.25 mg (09/20/2019), 2.25 mg (09/27/2019), 2.25 mg (10/04/2019), 2.25 mg (10/11/2019), 2.25 mg (10/18/2019), 2.25 mg (10/25/2019), 2.25 mg (11/01/2019), 2.25 mg (11/08/2019), 2.25 mg (11/15/2019), 2.25 mg (11/22/2019), 2.25 mg (11/29/2019), 2.25 mg (12/06/2019), 2.25 mg (12/13/2019), 2.25 mg (12/20/2019), 2.25 mg (12/27/2019), 2.25 mg (01/03/2020), 2.25 mg (01/10/2020)  for chemotherapy treatment.    09/15/2021 -  Chemotherapy   Patient is on Treatment Plan : MYELOMA RELAPSED REFRACTORY Daratumumab SQ + Pomalidomide + Dexamethasone (DaraPd) q28d       CANCER STAGING:  Cancer Staging  No matching staging information was found for the patient.  INTERVAL HISTORY:  [facility], a 81 y.o. female, seen for follow-up of multiple myeloma and severe cytopenias.  Last week we  have started back on Darzalex on 10/21/2021.  She reported on and off right ear pain.  No nosebleeds since last visit.  Normal dysphagia or odynophagia.  Gained about 4 pounds since last visit.  No GI symptoms.   REVIEW OF SYSTEMS:  Review of Systems  Constitutional:  Negative for appetite change and fatigue.  HENT:   Negative for nosebleeds.   Respiratory:  Negative for cough.   Gastrointestinal:  Negative for blood in stool.  All other systems reviewed and are negative.   PAST MEDICAL/SURGICAL HISTORY:  Past Medical History:  Diagnosis Date   Atrial fibrillation ([place]) 2011   Postop, spontaneous conversion to normal sinus after one hour   Compression fracture 07/24/2009   T12; kyphoplasty   History of echocardiogram 07/2009   EF 65%   Hypertension    Thyroid disease    Tobacco abuse    Past Surgical History:  Procedure Laterality Date   BACK SURGERY     BACK SURGERY  06/06/2015   BREAST EXCISIONAL BIOPSY Left    50 years ago  benign   CHOLECYSTECTOMY N/A 04/25/2015   Procedure: LAPAROSCOPIC CHOLECYSTECTOMY WITH INTRAOPERATIVE CHOLANGIOGRAM;  Surgeon: Mickeal Skinner, MD;  Location: [facility];  Service: General;  Laterality: N/A;   COLONOSCOPY N/A 11/26/2015   Procedure: COLONOSCOPY;  Surgeon: Daneil Dolin, MD;  Location: [facility];  Service: Endoscopy;  Laterality: N/A;  7:30 am   ERCP N/A 04/14/2015   Procedure: ENDOSCOPIC RETROGRADE CHOLANGIOPANCREATOGRAPHY (ERCP) Biliary Sphincterotomy, 10x7 stent placement Dilated bilary system just not well seen;  Surgeon: Rogene Houston, MD;  Location: [facility];  Service: Endoscopy;  Laterality: N/A;  ERCP N/A 06/12/2015   Procedure: ENDOSCOPIC RETROGRADE CHOLANGIOPANCREATOGRAPHY (ERCP);  Surgeon: Rogene Houston, MD;  [facility];  Service: Endoscopy;  Laterality: N/A;   ESOPHAGOGASTRODUODENOSCOPY N/A 06/12/2015   Procedure: DIAGNOSTIC ESOPHAGOGASTRODUODENOSCOPY (EGD);  Surgeon: Rogene Houston, MD;  [facility];   Service: Endoscopy;  Laterality: N/A;   FEMUR IM NAIL Right 05/11/2019   Procedure: RETROGRADE INTRAMEDULLARY NAIL FEMORAL;  Surgeon: Shona Needles, MD;  Location: [facility];  Service: Orthopedics;  Laterality: Right;   STENT REMOVAL  06/12/2015   Procedure: STENT REMOVAL ;  Surgeon: Rogene Houston, MD;  [facility];  Service: Endoscopy;;    SOCIAL HISTORY:  Social History   Socioeconomic History   Marital status: Widowed    Spouse name: Not on file   Number of children: 2   Years of education: Not on file   Highest education level: Not on file  Occupational History   Occupation: retired  Tobacco Use   Smoking status: Former    Packs/day: 0.15    Years: 20.00    Total pack years: 3.00    Types: Cigarettes    Quit date: 03/08/2013    Years since quitting: 8.6   Smokeless tobacco: Never  Vaping Use   Vaping Use: Never used  Substance and Sexual Activity   Alcohol use: No    Alcohol/week: 0.0 standard drinks of alcohol   Drug use: No   Sexual activity: Not Currently  Other Topics Concern   Not on file  Social History Narrative   Active in gardens and does yard work.   Social Determinants of Health   Financial Resource Strain: Low Risk  (05/08/2019)   Overall Financial Resource Strain (CARDIA)    Difficulty of Paying Living Expenses: Not hard at all  Food Insecurity: No Food Insecurity (12/28/2019)   Hunger Vital Sign    Worried About Running Out of Food in the Last Year: Never true    Ran Out of Food in the Last Year: Never true  Transportation Needs: No Transportation Needs (06/14/2019)   PRAPARE - Hydrologist (Medical): No    Lack of Transportation (Non-Medical): No  Physical Activity: Inactive (05/08/2019)   Exercise Vital Sign    Days of Exercise per Week: 0 days    Minutes of Exercise per Session: 0 min  Stress: No Stress Concern Present (05/08/2019)   [place]     Feeling of Stress : Not at all  Social Connections: Moderately Isolated (02/06/2020)   Social Connection and Isolation Panel [NHANES]    Frequency of Communication with Friends and Family: More than three times a week    Frequency of Social Gatherings with Friends and Family: More than three times a week    Attends Religious Services: More than 4 times per year    Active Member of [employer] or Organizations: No    Attends Archivist Meetings: Never    Marital Status: Widowed  Intimate Partner Violence: Not At Risk (05/08/2019)   Humiliation, Afraid, Rape, and Kick questionnaire    Fear of Current or Ex-Partner: No    Emotionally Abused: No    Physically Abused: No    Sexually Abused: No    FAMILY HISTORY:  Family History  Problem Relation Age of Onset   Heart attack Father    Stroke Father    Breast cancer Sister    Thyroid disease Neg Hx  Colon cancer Neg Hx     CURRENT MEDICATIONS:  Current Outpatient Medications  Medication Sig Dispense Refill   acyclovir (ZOVIRAX) 400 MG tablet TAKE (1) TABLET BY MOUTH TWICE DAILY. (Patient taking differently: Take 400 mg by mouth 2 (two) times daily.) 60 tablet 6   CALCIUM PO Take 1 capsule by mouth daily.     carboxymethylcellulose (REFRESH PLUS) 0.5 % SOLN Place 1 drop into both eyes in the morning, at noon, in the evening, and at bedtime.     Cholecalciferol (VITAMIN D) 125 MCG (5000 UT) CAPS Take 5,000 Units by mouth daily. 90 capsule 1   Cyanocobalamin (B-12 PO) Take 1 capsule by mouth daily.     feeding supplement (ENSURE ENLIVE / ENSURE PLUS) LIQD Take 237 mLs by mouth 2 (two) times daily between meals. 14220 mL 1   Ferrous Sulfate (IRON PO) Take 1 capsule by mouth daily.     levothyroxine (SYNTHROID) 137 MCG tablet Take 1 tablet (137 mcg total) by mouth daily before breakfast. 90 tablet 3   Multiple Vitamin (MULTIVITAMIN WITH MINERALS) TABS tablet Take 1 tablet by mouth daily.     ondansetron (ZOFRAN) 8 MG tablet Take 1 tablet  (8 mg total) by mouth every 8 (eight) hours as needed for nausea or vomiting. 60 tablet 0   pomalidomide (POMALYST) 2 MG capsule Take 1 capsule (2 mg total) by mouth daily. Take for 21 days, then hold for 7 days. Repeat every 28 days. 21 capsule 0   potassium chloride SA (KLOR-CON M) 20 MEQ tablet Take 1 tablet (20 mEq total) by mouth as needed. Take Potassium 40 meq by mouth then wait three hours and take 20 meq by mouth and wait three hours and take 40 meq by mouth for a total of Potassium 100 meq. 5 tablet 1   POTASSIUM PO Take 1 capsule by mouth daily.     SSD 1 % cream Apply 1 Application topically daily.     No current facility-administered medications for this visit.   Facility-Administered Medications Ordered in Other Visits  Medication Dose Route Frequency Provider Last Rate Last Admin   0.9 %  sodium chloride infusion (Manually program via Guardrails IV Fluids)  250 mL Intravenous Once Derek Jack, MD       0.9 %  sodium chloride infusion   Intravenous Continuous Derek Jack, MD   Stopped at 09/15/21 1454   daratumumab-hyaluronidase-fihj (DARZALEX FASPRO) 1800-30000 MG-UT/15ML chemo SQ injection 1,800 mg  1,800 mg Subcutaneous Once Derek Jack, MD        ALLERGIES:  No Known Allergies  PHYSICAL EXAM:  Performance status (ECOG): 1 - Symptomatic but completely ambulatory  There were no vitals filed for this visit.  Wt Readings from Last 3 Encounters:  10/20/21 123 lb 3.2 oz (55.9 kg)  10/13/21 133 lb (60.3 kg)  09/27/21 134 lb 0.6 oz (60.8 kg)   Physical Exam Vitals reviewed.  Constitutional:      Appearance: Normal appearance.  Cardiovascular:     Rate and Rhythm: Normal rate and regular rhythm.     Pulses: Normal pulses.     Heart sounds: Normal heart sounds.  Pulmonary:     Effort: Pulmonary effort is normal.     Breath sounds: Normal breath sounds.  Neurological:     General: No focal deficit present.     Mental Status: She is alert and  oriented to person, place, and time.  Psychiatric:        Mood and Affect:  Mood normal.        Behavior: Behavior normal.   Blood in the back of the throat.  LABORATORY DATA:  I have reviewed the labs as listed.     Latest Ref Rng & Units 10/20/2021    8:02 AM 10/15/2021    8:36 AM 10/12/2021   11:06 AM  CBC  WBC 4.0 - 10.5 K/uL 1.5  1.7  1.3   Hemoglobin 12.0 - 15.0 g/dL 5.9  7.5  8.1   Hematocrit 36.0 - 46.0 % 18.5  23.8  25.8   Platelets 150 - 400 K/uL 19  33  DCLMP       Latest Ref Rng & Units 10/20/2021    8:02 AM 10/12/2021   11:06 AM 10/06/2021    8:21 AM  CMP  Glucose 70 - 99 mg/dL 142  120  112   BUN 8 - 23 mg/dL 21  13  7    Creatinine 0.44 - 1.00 mg/dL 0.72  0.78  0.68   Sodium 135 - 145 mmol/L 134  134  136   Potassium 3.5 - 5.1 mmol/L 3.7  3.6  4.0   Chloride 98 - 111 mmol/L 98  96  100   CO2 22 - 32 mmol/L 28  29  29    Calcium 8.9 - 10.3 mg/dL 8.6  9.0  8.4   Total Protein 6.5 - 8.1 g/dL 5.8  6.2  6.0   Total Bilirubin 0.3 - 1.2 mg/dL 0.5  0.6  0.8   Alkaline Phos 38 - 126 U/L 96  86  72   AST 15 - 41 U/L 16  19  20    ALT 0 - 44 U/L 21  24  20      DIAGNOSTIC IMAGING:  I have independently reviewed the scans and discussed with the patient. No results found.   ASSESSMENT:  1.  IgA kappa plasma cell myeloma: -IgA kappa plasma cell myeloma diagnosed on right sacral bone biopsy on 05/17/2019.  SPEP did not show M spike.  Immunofixation shows IgA kappa.  Kappa light chains 42.8, lambda light chains at 9.9, ratio 4.32.  Beta-2 microglobulin 3.7. -PET scan on 06/04/2019 showed expansile lytic lesion involving the right sacral wing.  Lytic lesion involving T10 vertebral body.  Left seventh rib lesion.  Increased uptake in a pathological fracture involving distal aspect of the right femur. 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 -RVD started on 06/26/2019.  Revlimid 15 mg 2 weeks on/1 week off. -Velcade  discontinued on 01/10/2020. -She is taking Revlimid 15 mg 2 weeks on 1 week off, with dexamethasone 20 mg on weeks of Revlimid and 10 mg while she is off Revlimid.  Revlimid dose further reduced to 10 mg 3 weeks on/1 week off.  She has developed progressive anemia. - BMBX (08/07/2021): 38% plasma cells in the marrow.  Cytogenetics was normal.  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 - DPD regimen started on, single agent Darzalex and dexamethasone started on 09/15/2021.  Daratumumab held after first treatment due to pneumonia. - Pomalidomide 2 mg 3 weeks on/1 week off and dexamethasone 20 mg weekly started on 10/06/2021.  Daratumumab added on 10/21/2021.   PLAN:  1.  Relapsed IgA kappa plasma cell myeloma: - She has tolerated the pomalidomide 2 mg 3 weeks on/1 week off and dexamethasone 10 mg on Tuesday and Wednesday every week. - She will restart cycle 2  of pomalidomide on 11/03/2021. - She has tolerated last week treatment of Darzalex. - Continue weekly labs and possible transfusion. - Complains of right ear pain since nasal rocket was put in for nosebleeds.  We will make referral to ENT. - She will start cycle 2 next week.  RTC 5 weeks with myeloma labs 1 week prior.   2.  Hypokalemia: - Continue potassium daily.   3.  Normocytic anemia: - From bone marrow infiltration.  Hemoglobin today 7.7.  Will transfuse 1 unit this week.   4.  ID prophylaxis: - Continue acyclovir twice daily.  Hold aspirin 81 mg daily.   5.  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   Orders placed this encounter:  No orders of the defined types were placed in this encounter.    Derek Jack, MD [place] [phone]

## 2021-10-28 NOTE — Patient Instructions (Signed)
[facility]  Discharge Instructions: Thank you for choosing [facility] to provide your oncology and hematology care.  If you have a lab appointment with the [facility], please come in thru the Main Entrance and check in at the main information desk.  Wear comfortable clothing and clothing appropriate for easy access to any Portacath or PICC line.   We strive to give you quality time with your provider. You may need to reschedule your appointment if you arrive late (15 or more minutes).  Arriving late affects you and other patients whose appointments are after yours.  Also, if you miss three or more appointments without notifying the office, you may be dismissed from the clinic at the provider's discretion.      For prescription refill requests, have your pharmacy contact our office and allow 72 hours for refills to be completed.    Today you received the following chemotherapy and/or immunotherapy agents Darzalex Faspro. Daratumumab Injection What is this medication? DARATUMUMAB (dar a toom ue mab) treats multiple myeloma, a type of bone marrow cancer. It works by helping your immune system slow or stop the spread of cancer cells. It is a monoclonal antibody. This medicine may be used for other purposes; ask your health care provider or pharmacist if you have questions. COMMON BRAND NAME(S): DARZALEX What should I tell my care team before I take this medication? They need to know if you have any of these conditions: Hereditary fructose intolerance Infection, such as chickenpox, herpes, hepatitis B virus Lung or breathing disease, such as asthma, COPD An unusual or allergic reaction to daratumumab, sorbitol, other medications, foods, dyes, or preservatives Pregnant or trying to get pregnant Breast-feeding How should I use this medication? This medication is injected into a vein. It is given by your care team in a hospital or clinic setting. Talk to  your care team about the use of this medication in children. Special care may be needed. Overdosage: If you think you have taken too much of this medicine contact a poison control center or emergency room at once. NOTE: This medicine is only for you. Do not share this medicine with others. What if I miss a dose? Keep appointments for follow-up doses. It is important not to miss your dose. Call your care team if you are unable to keep an appointment. What may interact with this medication? Interactions have not been studied. This list may not describe all possible interactions. Give your health care provider a list of all the medicines, herbs, non-prescription drugs, or dietary supplements you use. Also tell them if you smoke, drink alcohol, or use illegal drugs. Some items may interact with your medicine. What should I watch for while using this medication? Your condition will be monitored carefully while you are receiving this medication. This medication can cause serious allergic reactions. To reduce your risk, your care team may give you other medication to take before receiving this one. Be sure to follow the directions from your care team. This medication can affect the results of blood tests to match your blood type. These changes can last for up to 6 months after the final dose. Your care team will do blood tests to match your blood type before you start treatment. Tell all of your care team that you are being treated with this medication before receiving a blood transfusion. This medication can affect the results of some tests used to determine treatment response; extra tests may be needed  to evaluate response. Talk to your care team if you wish to become pregnant or think you are pregnant. This medication can cause serious birth defects if taken during pregnancy and for 3 months after the last dose. A reliable form of contraception is recommended while taking this medication and for 3 months  after the last dose. Talk to your care team about effective forms of contraception. Do not breast-feed while taking this medication. What side effects may I notice from receiving this medication? Side effects that you should report to your care team as soon as possible: Allergic reactions--skin rash, itching, hives, swelling of the face, lips, tongue, or throat Infection--fever, chills, cough, sore throat, wounds that don't heal, pain or trouble when passing urine, general feeling of discomfort or being unwell Infusion reactions--chest pain, shortness of breath or trouble breathing, feeling faint or lightheaded Unusual bruising or bleeding Side effects that usually do not require medical attention (report to your care team if they continue or are bothersome): Constipation Diarrhea Fatigue Nausea Pain, tingling, or numbness in the hands or feet Swelling of the ankles, hands, or feet This list may not describe all possible side effects. Call your doctor for medical advice about side effects. You may report side effects to FDA at 1-800-FDA-1088. Where should I keep my medication? This medication is given in a hospital or clinic. It will not be stored at home. NOTE: This sheet is a summary. It may not cover all possible information. If you have questions about this medicine, talk to your doctor, pharmacist, or health care provider.  2023 Elsevier/Gold Standard (2014-01-21 00:00:00)       To help prevent nausea and vomiting after your treatment, we encourage you to take your nausea medication as directed.  BELOW ARE SYMPTOMS THAT SHOULD BE REPORTED IMMEDIATELY: *FEVER GREATER THAN 100.4 F (38 C) OR HIGHER *CHILLS OR SWEATING *NAUSEA AND VOMITING THAT IS NOT CONTROLLED WITH YOUR NAUSEA MEDICATION *UNUSUAL SHORTNESS OF BREATH *UNUSUAL BRUISING OR BLEEDING *URINARY PROBLEMS (pain or burning when urinating, or frequent urination) *BOWEL PROBLEMS (unusual diarrhea, constipation, pain near the  anus) TENDERNESS IN MOUTH AND THROAT WITH OR WITHOUT PRESENCE OF ULCERS (sore throat, sores in mouth, or a toothache) UNUSUAL RASH, SWELLING OR PAIN  UNUSUAL VAGINAL DISCHARGE OR ITCHING   Items with * indicate a potential emergency and should be followed up as soon as possible or go to the Emergency Department if any problems should occur.  Please show the CHEMOTHERAPY ALERT CARD or IMMUNOTHERAPY ALERT CARD at check-in to the Emergency Department and triage nurse.  Should you have questions after your visit or need to cancel or reschedule your appointment, please contact [facility] [phone]  and follow the prompts.  Office hours are 8:00 a.m. to 4:30 p.m. Monday - Friday. Please note that voicemails left after 4:00 p.m. may not be returned until the following business day.  We are closed weekends and major holidays. You have access to a nurse at all times for urgent questions. Please call the main number to the clinic [phone] and follow the prompts.  For any non-urgent questions, you may also contact your provider using MyChart. We now offer e-Visits for anyone 24 and older to request care online for non-urgent symptoms. For details visit [email or web address].   Also download the MyChart app! Go to the app store, search "MyChart", open the app, select [facility], and log in with your MyChart username and password.  Masks are optional in the cancer centers.  If you would like for your care team to wear a mask while they are taking care of you, please let them know. You may have one support person who is at least 80 years old accompany you for your appointments.

## 2021-10-28 NOTE — Patient Instructions (Addendum)
[facility] at [facility] Discharge Instructions   You were seen and examined today by Dr. Delton Coombes.  Your lab results are pending today.   Continue Pomalyst as prescribed.  We will refer you to an ENT doctor for right ear hearing loss and ear pain.   Return as scheduled.    Thank you for choosing [facility] at [facility] to provide your oncology and hematology care.  To afford each patient quality time with our provider, please arrive at least 15 minutes before your scheduled appointment time.   If you have a lab appointment with the [facility] please come in thru the Main Entrance and check in at the main information desk.  You need to re-schedule your appointment should you arrive 10 or more minutes late.  We strive to give you quality time with our providers, and arriving late affects you and other patients whose appointments are after yours.  Also, if you no show three or more times for appointments you may be dismissed from the clinic at the providers discretion.     Again, thank you for choosing [facility].  Our hope is that these requests will decrease the amount of time that you wait before being seen by our physicians.       _____________________________________________________________  Should you have questions after your visit to [facility], please contact our office at [phone] and follow the prompts.  Our office hours are 8:00 a.m. and 4:30 p.m. Monday - Friday.  Please note that voicemails left after 4:00 p.m. may not be returned until the following business day.  We are closed weekends and major holidays.  You do have access to a nurse 24-7, just call the main number to the clinic [phone] and do not press any options, hold on the line and a nurse will answer the phone.    For prescription refill requests, have your pharmacy contact our office and allow 72 hours.    Due to Covid, you  will need to wear a mask upon entering the hospital. If you do not have a mask, a mask will be given to you at the Main Entrance upon arrival. For doctor visits, patients may have 1 support person age 69 or older with them. For treatment visits, patients can not have anyone with them due to social distancing guidelines and our immunocompromised population.

## 2021-10-28 NOTE — Progress Notes (Signed)
CRITICAL VALUE ALERT Critical value received:  WBC 1.2 and ANC 0.3.  Date of notification:  10-28-2021 Time of notification: 08:24 am.  Critical value read back:  Yes.   Nurse who received alert:  B. Emmaleigh Longo RN  MD notified time and response:  Dr. Delton Coombes @ 08:47am. Patient being seen by Dr. Delton Coombes today. No new orders received.    Patient presents today for Darzalex Faspro [place] and f/u visit with Dr. Delton Coombes. Labs reviewed and critical labs reported. MD aware. Vital signs within parameters for treatment.    Message received from A. Anderson RN/ Dr. Delton Coombes to proceed with tx. Verbal order received to proceed with treatment. Infuse 1 unit of platelets and 1 unit of blood 10/29/2021.  Darzalex Faspro given today per MD orders. Tolerated without adverse affects. Vital signs stable. No complaints at this time. Discharged from clinic ambulatory in stable condition. Alert and oriented x 3. F/U with [facility] as scheduled.

## 2021-10-29 ENCOUNTER — Inpatient Hospital Stay: Payer: Medicare Other

## 2021-10-29 DIAGNOSIS — C9000 Multiple myeloma not having achieved remission: Secondary | ICD-10-CM

## 2021-10-29 DIAGNOSIS — Z79899 Other long term (current) drug therapy: Secondary | ICD-10-CM | POA: Diagnosis not present

## 2021-10-29 DIAGNOSIS — C9002 Multiple myeloma in relapse: Secondary | ICD-10-CM | POA: Diagnosis not present

## 2021-10-29 DIAGNOSIS — Z5112 Encounter for antineoplastic immunotherapy: Secondary | ICD-10-CM | POA: Diagnosis not present

## 2021-10-29 MED ORDER — DIPHENHYDRAMINE HCL 25 MG PO CAPS: 25.0000 mg | ORAL_CAPSULE | Freq: Once | ORAL | Status: DC

## 2021-10-29 MED ORDER — ACETAMINOPHEN 325 MG PO TABS: 650.0000 mg | ORAL_TABLET | Freq: Once | ORAL | Status: DC

## 2021-10-29 MED ORDER — SODIUM CHLORIDE 0.9% IV SOLUTION
250.0000 mL | Freq: Once | INTRAVENOUS | Status: AC
  Administered 2021-10-29: 250 mL via INTRAVENOUS

## 2021-10-29 NOTE — Patient Instructions (Signed)
[facility]  Discharge Instructions: Thank you for choosing [facility] to provide your oncology and hematology care.  If you have a lab appointment with the [facility], please come in thru the Main Entrance and check in at the main information desk.  Wear comfortable clothing and clothing appropriate for easy access to any Portacath or PICC line.   We strive to give you quality time with your provider. You may need to reschedule your appointment if you arrive late (15 or more minutes).  Arriving late affects you and other patients whose appointments are after yours.  Also, if you miss three or more appointments without notifying the office, you may be dismissed from the clinic at the provider's discretion.      For prescription refill requests, have your pharmacy contact our office and allow 72 hours for refills to be completed.    One unit of blood and one unit of platelets given today per MD.    To help prevent nausea and vomiting after your treatment, we encourage you to take your nausea medication as directed.  BELOW ARE SYMPTOMS THAT SHOULD BE REPORTED IMMEDIATELY: *FEVER GREATER THAN 100.4 F (38 C) OR HIGHER *CHILLS OR SWEATING *NAUSEA AND VOMITING THAT IS NOT CONTROLLED WITH YOUR NAUSEA MEDICATION *UNUSUAL SHORTNESS OF BREATH *UNUSUAL BRUISING OR BLEEDING *URINARY PROBLEMS (pain or burning when urinating, or frequent urination) *BOWEL PROBLEMS (unusual diarrhea, constipation, pain near the anus) TENDERNESS IN MOUTH AND THROAT WITH OR WITHOUT PRESENCE OF ULCERS (sore throat, sores in mouth, or a toothache) UNUSUAL RASH, SWELLING OR PAIN  UNUSUAL VAGINAL DISCHARGE OR ITCHING   Items with * indicate a potential emergency and should be followed up as soon as possible or go to the Emergency Department if any problems should occur.  Please show the CHEMOTHERAPY ALERT CARD or IMMUNOTHERAPY ALERT CARD at check-in to the Emergency Department and  triage nurse.  Should you have questions after your visit or need to cancel or reschedule your appointment, please contact [facility] [phone]  and follow the prompts.  Office hours are 8:00 a.m. to 4:30 p.m. Monday - Friday. Please note that voicemails left after 4:00 p.m. may not be returned until the following business day.  We are closed weekends and major holidays. You have access to a nurse at all times for urgent questions. Please call the main number to the clinic [phone] and follow the prompts.  For any non-urgent questions, you may also contact your provider using MyChart. We now offer e-Visits for anyone 13 and older to request care online for non-urgent symptoms. For details visit [email or web address].   Also download the MyChart app! Go to the app store, search "MyChart", open the app, select [facility], and log in with your MyChart username and password.  Masks are optional in the cancer centers. If you would like for your care team to wear a mask while they are taking care of you, please let them know. You may have one support person who is at least 80 years old accompany you for your appointments.

## 2021-10-29 NOTE — Progress Notes (Signed)
Patient presented today for her blood infusion. Patient took her pre-meds at home at 1100.   One unit of platelets and blood given per orders. Patient tolerated it well without problems. Vitals stable and discharged home from clinic via wheelchair. Follow up as scheduled.

## 2021-10-30 LAB — TYPE AND SCREEN
ABO/RH(D): O POS
Antibody Screen: POSITIVE
Donor AG Type: NEGATIVE
Unit division: 0
Unit division: 0

## 2021-10-30 LAB — BPAM RBC
Blood Product Expiration Date: 202308252359
Blood Product Expiration Date: 202309172359
ISSUE DATE / TIME: 202308241337
Unit Type and Rh: 5100
Unit Type and Rh: 6200

## 2021-10-30 LAB — BPAM PLATELET PHERESIS
Blood Product Expiration Date: 202308252359
ISSUE DATE / TIME: 202308241337
Unit Type and Rh: 6200

## 2021-10-30 LAB — PREPARE PLATELET PHERESIS: Unit division: 0

## 2021-11-03 ENCOUNTER — Inpatient Hospital Stay: Payer: Medicare Other

## 2021-11-03 ENCOUNTER — Inpatient Hospital Stay: Payer: Medicare Other | Admitting: Hematology

## 2021-11-04 ENCOUNTER — Inpatient Hospital Stay: Payer: Medicare Other | Admitting: Hematology

## 2021-11-04 ENCOUNTER — Inpatient Hospital Stay: Payer: Medicare Other

## 2021-11-04 VITALS — BP 141/50 | HR 73 | Temp 99.2°F | Resp 18 | Wt 128.1 lb

## 2021-11-04 DIAGNOSIS — C9000 Multiple myeloma not having achieved remission: Secondary | ICD-10-CM

## 2021-11-04 DIAGNOSIS — D649 Anemia, unspecified: Secondary | ICD-10-CM

## 2021-11-04 DIAGNOSIS — Z5112 Encounter for antineoplastic immunotherapy: Secondary | ICD-10-CM | POA: Diagnosis not present

## 2021-11-04 DIAGNOSIS — Z79899 Other long term (current) drug therapy: Secondary | ICD-10-CM | POA: Diagnosis not present

## 2021-11-04 DIAGNOSIS — C9002 Multiple myeloma in relapse: Secondary | ICD-10-CM | POA: Diagnosis not present

## 2021-11-04 LAB — CBC WITH DIFFERENTIAL/PLATELET
Abs Immature Granulocytes: 0.03 10*3/uL (ref 0.00–0.07)
Basophils Absolute: 0 10*3/uL (ref 0.0–0.1)
Basophils Relative: 1 %
Eosinophils Absolute: 0 10*3/uL (ref 0.0–0.5)
Eosinophils Relative: 1 %
HCT: 26.6 % — ABNORMAL LOW (ref 36.0–46.0)
Hemoglobin: 8.6 g/dL — ABNORMAL LOW (ref 12.0–15.0)
Immature Granulocytes: 2 %
Lymphocytes Relative: 41 %
Lymphs Abs: 0.9 10*3/uL (ref 0.7–4.0)
MCH: 29.2 pg (ref 26.0–34.0)
MCHC: 32.3 g/dL (ref 30.0–36.0)
MCV: 90.2 fL (ref 80.0–100.0)
Monocytes Absolute: 0.3 10*3/uL (ref 0.1–1.0)
Monocytes Relative: 16 %
Neutro Abs: 0.8 10*3/uL — ABNORMAL LOW (ref 1.7–7.7)
Neutrophils Relative %: 39 %
Platelets: 78 10*3/uL — ABNORMAL LOW (ref 150–400)
RBC: 2.95 MIL/uL — ABNORMAL LOW (ref 3.87–5.11)
RDW: 15.8 % — ABNORMAL HIGH (ref 11.5–15.5)
WBC: 2 10*3/uL — ABNORMAL LOW (ref 4.0–10.5)
nRBC: 0 % (ref 0.0–0.2)

## 2021-11-04 LAB — COMPREHENSIVE METABOLIC PANEL
ALT: 23 U/L (ref 0–44)
AST: 17 U/L (ref 15–41)
Albumin: 3.1 g/dL — ABNORMAL LOW (ref 3.5–5.0)
Alkaline Phosphatase: 99 U/L (ref 38–126)
Anion gap: 9 (ref 5–15)
BUN: 17 mg/dL (ref 8–23)
CO2: 26 mmol/L (ref 22–32)
Calcium: 8.6 mg/dL — ABNORMAL LOW (ref 8.9–10.3)
Chloride: 101 mmol/L (ref 98–111)
Creatinine, Ser: 0.67 mg/dL (ref 0.44–1.00)
GFR, Estimated: >60 mL/min (ref >60)
Glucose, Bld: 126 mg/dL — ABNORMAL HIGH (ref 70–99)
Potassium: 3.7 mmol/L (ref 3.5–5.1)
Sodium: 136 mmol/L (ref 135–145)
Total Bilirubin: 0.6 mg/dL (ref 0.3–1.2)
Total Protein: 6.2 g/dL — ABNORMAL LOW (ref 6.5–8.1)

## 2021-11-04 LAB — MAGNESIUM: Magnesium: 2.1 mg/dL (ref 1.7–2.4)

## 2021-11-04 LAB — SAMPLE TO BLOOD BANK

## 2021-11-04 MED ORDER — DARATUMUMAB-HYALURONIDASE-FIHJ 1800-30000 MG-UT/15ML ~~LOC~~ SOLN
1800.0000 mg | Freq: Once | SUBCUTANEOUS | Status: AC
  Administered 2021-11-04: 1800 mg via SUBCUTANEOUS
  Filled 2021-11-04: qty 15

## 2021-11-04 NOTE — Patient Instructions (Signed)
[facility]  Discharge Instructions: Thank you for choosing [facility] to provide your oncology and hematology care.  If you have a lab appointment with the Cancer Center, please come in thru the Main Entrance and check in at the main information desk.  Wear comfortable clothing and clothing appropriate for easy access to any Portacath or PICC line.   We strive to give you quality time with your provider. You may need to reschedule your appointment if you arrive late (15 or more minutes).  Arriving late affects you and other patients whose appointments are after yours.  Also, if you miss three or more appointments without notifying the office, you may be dismissed from the clinic at the provider's discretion.      For prescription refill requests, have your pharmacy contact our office and allow 72 hours for refills to be completed.    Today you received the following chemotherapy and/or immunotherapy agents Daratumumab      To help prevent nausea and vomiting after your treatment, we encourage you to take your nausea medication as directed.  BELOW ARE SYMPTOMS THAT SHOULD BE REPORTED IMMEDIATELY: *FEVER GREATER THAN 100.4 F (38 C) OR HIGHER *CHILLS OR SWEATING *NAUSEA AND VOMITING THAT IS NOT CONTROLLED WITH YOUR NAUSEA MEDICATION *UNUSUAL SHORTNESS OF BREATH *UNUSUAL BRUISING OR BLEEDING *URINARY PROBLEMS (pain or burning when urinating, or frequent urination) *BOWEL PROBLEMS (unusual diarrhea, constipation, pain near the anus) TENDERNESS IN MOUTH AND THROAT WITH OR WITHOUT PRESENCE OF ULCERS (sore throat, sores in mouth, or a toothache) UNUSUAL RASH, SWELLING OR PAIN  UNUSUAL VAGINAL DISCHARGE OR ITCHING   Items with * indicate a potential emergency and should be followed up as soon as possible or go to the Emergency Department if any problems should occur.  Please show the CHEMOTHERAPY ALERT CARD or IMMUNOTHERAPY ALERT CARD at check-in to the  Emergency Department and triage nurse.  Should you have questions after your visit or need to cancel or reschedule your appointment, please contact [facility] AT  [phone]  and follow the prompts.  Office hours are 8:00 a.m. to 4:30 p.m. Monday - Friday. Please note that voicemails left after 4:00 p.m. may not be returned until the following business day.  We are closed weekends and major holidays. You have access to a nurse at all times for urgent questions. Please call the main number to the clinic [phone] and follow the prompts.  For any non-urgent questions, you may also contact your provider using MyChart. We now offer e-Visits for anyone 18 and older to request care online for non-urgent symptoms. For details visit [email or web address].   Also download the MyChart app! Go to the app store, search "MyChart", open the app, select , and log in with your MyChart username and password.  Masks are optional in the cancer centers. If you would like for your care team to wear a mask while they are taking care of you, please let them know. You may have one support person who is at least 80 years old accompany you for your appointments.  

## 2021-11-04 NOTE — Progress Notes (Signed)
Patient presents today for Daratumumab injection per providers order.  Vital signs within parameters for treatment.  Labs reviewed and [place] noted to be 0.8, Katragadda notified.  Message received from Anastasio Champion RN/Dr. Delton Coombes patient okay for treatment.  Patient took her premedications at home.  Stable during administration without incident; injection site WNL; see MAR for injection details.  Patient tolerated procedure well and without incident.  No questions or complaints noted at this time. Discharge from clinic via wheelchair in stable condition.  Alert and oriented X 3.  Follow up with [facility] as scheduled.

## 2021-11-05 ENCOUNTER — Inpatient Hospital Stay: Payer: Medicare Other

## 2021-11-09 ENCOUNTER — Other Ambulatory Visit: Payer: Self-pay | Admitting: Hematology

## 2021-11-09 DIAGNOSIS — C9000 Multiple myeloma not having achieved remission: Secondary | ICD-10-CM

## 2021-11-11 ENCOUNTER — Inpatient Hospital Stay: Payer: Medicare Other

## 2021-11-11 ENCOUNTER — Inpatient Hospital Stay: Payer: Medicare Other | Attending: Hematology

## 2021-11-11 ENCOUNTER — Inpatient Hospital Stay: Payer: Medicare Other | Admitting: Hematology

## 2021-11-11 VITALS — BP 122/52 | HR 63 | Temp 97.2°F | Resp 18 | Ht 62.0 in | Wt 132.7 lb

## 2021-11-11 DIAGNOSIS — Z79899 Other long term (current) drug therapy: Secondary | ICD-10-CM | POA: Insufficient documentation

## 2021-11-11 DIAGNOSIS — D649 Anemia, unspecified: Secondary | ICD-10-CM | POA: Insufficient documentation

## 2021-11-11 DIAGNOSIS — C9000 Multiple myeloma not having achieved remission: Secondary | ICD-10-CM

## 2021-11-11 DIAGNOSIS — Z5112 Encounter for antineoplastic immunotherapy: Secondary | ICD-10-CM | POA: Diagnosis present

## 2021-11-11 DIAGNOSIS — C9002 Multiple myeloma in relapse: Secondary | ICD-10-CM | POA: Insufficient documentation

## 2021-11-11 LAB — CBC WITH DIFFERENTIAL/PLATELET
Abs Immature Granulocytes: 0.02 10*3/uL (ref 0.00–0.07)
Basophils Absolute: 0 10*3/uL (ref 0.0–0.1)
Basophils Relative: 0 %
Eosinophils Absolute: 0 10*3/uL (ref 0.0–0.5)
Eosinophils Relative: 0 %
HCT: 25.3 % — ABNORMAL LOW (ref 36.0–46.0)
Hemoglobin: 8.1 g/dL — ABNORMAL LOW (ref 12.0–15.0)
Immature Granulocytes: 1 %
Lymphocytes Relative: 32 %
Lymphs Abs: 0.8 10*3/uL (ref 0.7–4.0)
MCH: 29.7 pg (ref 26.0–34.0)
MCHC: 32 g/dL (ref 30.0–36.0)
MCV: 92.7 fL (ref 80.0–100.0)
Monocytes Absolute: 0.2 10*3/uL (ref 0.1–1.0)
Monocytes Relative: 7 %
Neutro Abs: 1.5 10*3/uL — ABNORMAL LOW (ref 1.7–7.7)
Neutrophils Relative %: 60 %
Platelets: 100 10*3/uL — ABNORMAL LOW (ref 150–400)
RBC: 2.73 MIL/uL — ABNORMAL LOW (ref 3.87–5.11)
RDW: 17.2 % — ABNORMAL HIGH (ref 11.5–15.5)
WBC: 2.5 10*3/uL — ABNORMAL LOW (ref 4.0–10.5)
nRBC: 0 % (ref 0.0–0.2)

## 2021-11-11 LAB — SAMPLE TO BLOOD BANK

## 2021-11-11 MED ORDER — DARATUMUMAB-HYALURONIDASE-FIHJ 1800-30000 MG-UT/15ML ~~LOC~~ SOLN
1800.0000 mg | Freq: Once | SUBCUTANEOUS | Status: AC
  Administered 2021-11-11: 1800 mg via SUBCUTANEOUS
  Filled 2021-11-11: qty 15

## 2021-11-11 NOTE — Progress Notes (Signed)
Patient presents today for Daratumumab, patient reports taking pre-medications at home before coming in today. Patient tolerated Daratumumab injection with no complaints voiced. See MAR for details. Lab reviewed. Injection site clean and dry with no bruising or swelling noted at site. Band aid applied. Vss with discharge and left in satisfactory condition with nos/s of distress noted.

## 2021-11-11 NOTE — Patient Instructions (Signed)
[facility]  Discharge Instructions: Thank you for choosing [facility] to provide your oncology and hematology care.  If you have a lab appointment with the Cancer Center, please come in thru the Main Entrance and check in at the main information desk.  Wear comfortable clothing and clothing appropriate for easy access to any Portacath or PICC line.   We strive to give you quality time with your provider. You may need to reschedule your appointment if you arrive late (15 or more minutes).  Arriving late affects you and other patients whose appointments are after yours.  Also, if you miss three or more appointments without notifying the office, you may be dismissed from the clinic at the provider's discretion.      For prescription refill requests, have your pharmacy contact our office and allow 72 hours for refills to be completed.    Today you received the following chemotherapy and/or immunotherapy agents Daratumumab, return as scheduled.   To help prevent nausea and vomiting after your treatment, we encourage you to take your nausea medication as directed.  BELOW ARE SYMPTOMS THAT SHOULD BE REPORTED IMMEDIATELY: *FEVER GREATER THAN 100.4 F (38 C) OR HIGHER *CHILLS OR SWEATING *NAUSEA AND VOMITING THAT IS NOT CONTROLLED WITH YOUR NAUSEA MEDICATION *UNUSUAL SHORTNESS OF BREATH *UNUSUAL BRUISING OR BLEEDING *URINARY PROBLEMS (pain or burning when urinating, or frequent urination) *BOWEL PROBLEMS (unusual diarrhea, constipation, pain near the anus) TENDERNESS IN MOUTH AND THROAT WITH OR WITHOUT PRESENCE OF ULCERS (sore throat, sores in mouth, or a toothache) UNUSUAL RASH, SWELLING OR PAIN  UNUSUAL VAGINAL DISCHARGE OR ITCHING   Items with * indicate a potential emergency and should be followed up as soon as possible or go to the Emergency Department if any problems should occur.  Please show the CHEMOTHERAPY ALERT CARD or IMMUNOTHERAPY ALERT CARD at  check-in to the Emergency Department and triage nurse.  Should you have questions after your visit or need to cancel or reschedule your appointment, please contact [facility] [phone]  and follow the prompts.  Office hours are 8:00 a.m. to 4:30 p.m. Monday - Friday. Please note that voicemails left after 4:00 p.m. may not be returned until the following business day.  We are closed weekends and major holidays. You have access to a nurse at all times for urgent questions. Please call the main number to the clinic [phone] and follow the prompts.  For any non-urgent questions, you may also contact your provider using MyChart. We now offer e-Visits for anyone 18 and older to request care online for non-urgent symptoms. For details visit [email or web address].   Also download the MyChart app! Go to the app store, search "MyChart", open the app, select [facility], and log in with your MyChart username and password.  Masks are optional in the cancer centers. If you would like for your care team to wear a mask while they are taking care of you, please let them know. You may have one support person who is at least 80 years old accompany you for your appointments.  

## 2021-11-12 ENCOUNTER — Inpatient Hospital Stay: Payer: Medicare Other

## 2021-11-16 ENCOUNTER — Other Ambulatory Visit: Payer: Self-pay

## 2021-11-16 DIAGNOSIS — C9000 Multiple myeloma not having achieved remission: Secondary | ICD-10-CM

## 2021-11-16 MED ORDER — POMALIDOMIDE 2 MG PO CAPS: 2.0000 mg | ORAL_CAPSULE | Freq: Every day | ORAL | 0 refills | Status: DC

## 2021-11-16 NOTE — Telephone Encounter (Signed)
Chart reviewed. Pomalyst refilled per last office note with Dr. Katragadda.  

## 2021-11-18 ENCOUNTER — Inpatient Hospital Stay: Payer: Medicare Other

## 2021-11-18 VITALS — BP 118/62 | HR 70 | Temp 98.1°F | Resp 18 | Wt 127.8 lb

## 2021-11-18 DIAGNOSIS — Z5112 Encounter for antineoplastic immunotherapy: Secondary | ICD-10-CM | POA: Diagnosis not present

## 2021-11-18 DIAGNOSIS — C9000 Multiple myeloma not having achieved remission: Secondary | ICD-10-CM

## 2021-11-18 DIAGNOSIS — D649 Anemia, unspecified: Secondary | ICD-10-CM

## 2021-11-18 LAB — CBC WITH DIFFERENTIAL/PLATELET
Abs Immature Granulocytes: 0.03 10*3/uL (ref 0.00–0.07)
Basophils Absolute: 0 10*3/uL (ref 0.0–0.1)
Basophils Relative: 1 %
Eosinophils Absolute: 0 10*3/uL (ref 0.0–0.5)
Eosinophils Relative: 0 %
HCT: 24.1 % — ABNORMAL LOW (ref 36.0–46.0)
Hemoglobin: 7.8 g/dL — ABNORMAL LOW (ref 12.0–15.0)
Immature Granulocytes: 2 %
Lymphocytes Relative: 40 %
Lymphs Abs: 0.6 10*3/uL — ABNORMAL LOW (ref 0.7–4.0)
MCH: 30 pg (ref 26.0–34.0)
MCHC: 32.4 g/dL (ref 30.0–36.0)
MCV: 92.7 fL (ref 80.0–100.0)
Monocytes Absolute: 0.2 10*3/uL (ref 0.1–1.0)
Monocytes Relative: 14 %
Neutro Abs: 0.6 10*3/uL — ABNORMAL LOW (ref 1.7–7.7)
Neutrophils Relative %: 43 %
Platelets: 71 10*3/uL — ABNORMAL LOW (ref 150–400)
RBC: 2.6 MIL/uL — ABNORMAL LOW (ref 3.87–5.11)
RDW: 18.1 % — ABNORMAL HIGH (ref 11.5–15.5)
WBC: 1.5 10*3/uL — ABNORMAL LOW (ref 4.0–10.5)
nRBC: 0 % (ref 0.0–0.2)

## 2021-11-18 LAB — COMPREHENSIVE METABOLIC PANEL
ALT: 29 U/L (ref 0–44)
AST: 17 U/L (ref 15–41)
Albumin: 3.6 g/dL (ref 3.5–5.0)
Alkaline Phosphatase: 120 U/L (ref 38–126)
Anion gap: 7 (ref 5–15)
BUN: 23 mg/dL (ref 8–23)
CO2: 27 mmol/L (ref 22–32)
Calcium: 9 mg/dL (ref 8.9–10.3)
Chloride: 98 mmol/L (ref 98–111)
Creatinine, Ser: 0.77 mg/dL (ref 0.44–1.00)
GFR, Estimated: >60 mL/min (ref >60)
Glucose, Bld: 128 mg/dL — ABNORMAL HIGH (ref 70–99)
Potassium: 4.2 mmol/L (ref 3.5–5.1)
Sodium: 132 mmol/L — ABNORMAL LOW (ref 135–145)
Total Bilirubin: 0.7 mg/dL (ref 0.3–1.2)
Total Protein: 7 g/dL (ref 6.5–8.1)

## 2021-11-18 LAB — SAMPLE TO BLOOD BANK

## 2021-11-18 LAB — MAGNESIUM: Magnesium: 2.2 mg/dL (ref 1.7–2.4)

## 2021-11-18 MED ORDER — DENOSUMAB 120 MG/1.7ML ~~LOC~~ SOLN
120.0000 mg | Freq: Once | SUBCUTANEOUS | Status: AC
  Administered 2021-11-18: 120 mg via SUBCUTANEOUS
  Filled 2021-11-18: qty 1.7

## 2021-11-18 MED ORDER — DARATUMUMAB-HYALURONIDASE-FIHJ 1800-30000 MG-UT/15ML ~~LOC~~ SOLN
1800.0000 mg | Freq: Once | SUBCUTANEOUS | Status: AC
  Administered 2021-11-18: 1800 mg via SUBCUTANEOUS
  Filled 2021-11-18: qty 15

## 2021-11-18 NOTE — Progress Notes (Signed)
Patient presents today for Daratumumab and Xgeva injections.  Patient is in satisfactory condition with no complaint voiced.  Vital signs are stable.  Labs reviewed.  Platelets today are 71 and ANC is 0.6.  Dr. Grayland Ormond notified.  Ok to continue with treatment per Dr. Grayland Ormond. Pre-medications taken at 1225 at home prior to visit.   Patient tolerated injections well with no complaints voiced.  Patient left ambulatory in stable condition.  Vital signs stable at discharge.  Follow up as scheduled.

## 2021-11-18 NOTE — Patient Instructions (Signed)
[facility]  Discharge Instructions: Thank you for choosing [facility] to provide your oncology and hematology care.  If you have a lab appointment with the Cancer Center, please come in thru the Main Entrance and check in at the main information desk.  Wear comfortable clothing and clothing appropriate for easy access to any Portacath or PICC line.   We strive to give you quality time with your provider. You may need to reschedule your appointment if you arrive late (15 or more minutes).  Arriving late affects you and other patients whose appointments are after yours.  Also, if you miss three or more appointments without notifying the office, you may be dismissed from the clinic at the provider's discretion.      For prescription refill requests, have your pharmacy contact our office and allow 72 hours for refills to be completed.    Today you received the following chemotherapy and/or immunotherapy agents Daratumumab & Xgeva.   Denosumab Injection (Oncology) What is this medication? DENOSUMAB (den oh SUE mab) prevents weakened bones caused by cancer. It may also be used to treat noncancerous bone tumors that cannot be removed by surgery. It can also be used to treat high calcium levels in the blood caused by cancer. It works by blocking a protein that causes bones to break down quickly. This slows down the release of calcium from bones, which lowers calcium levels in your blood. It also makes your bones stronger and less likely to break (fracture). This medicine may be used for other purposes; ask your health care provider or pharmacist if you have questions. COMMON BRAND NAME(S): XGEVA What should I tell my care team before I take this medication? They need to know if you have any of these conditions: Dental disease Having surgery or tooth extraction Infection Kidney disease Low levels of calcium or vitamin D in the blood Malnutrition On  hemodialysis Skin conditions or sensitivity Thyroid or parathyroid disease An unusual reaction to denosumab, other medications, foods, dyes, or preservatives Pregnant or trying to get pregnant Breast-feeding How should I use this medication? This medication is for injection under the skin. It is given by your care team in a hospital or clinic setting. A special MedGuide will be given to you before each treatment. Be sure to read this information carefully each time. Talk to your care team about the use of this medication in children. While it may be prescribed for children as young as 13 years for selected conditions, precautions do apply. Overdosage: If you think you have taken too much of this medicine contact a poison control center or emergency room at once. NOTE: This medicine is only for you. Do not share this medicine with others. What if I miss a dose? Keep appointments for follow-up doses. It is important not to miss your dose. Call your care team if you are unable to keep an appointment. What may interact with this medication? Do not take this medication with any of the following: Other medications containing denosumab This medication may also interact with the following: Medications that lower your chance of fighting infection Steroid medications, such as prednisone or cortisone This list may not describe all possible interactions. Give your health care provider a list of all the medicines, herbs, non-prescription drugs, or dietary supplements you use. Also tell them if you smoke, drink alcohol, or use illegal drugs. Some items may interact with your medicine. What should I watch for while using this medication?   Your condition will be monitored carefully while you are receiving this medication. You may need blood work while taking this medication. This medication may increase your risk of getting an infection. Call your care team for advice if you get a fever, chills, sore throat,  or other symptoms of a cold or flu. Do not treat yourself. Try to avoid being around people who are sick. You should make sure you get enough calcium and vitamin D while you are taking this medication, unless your care team tells you not to. Discuss the foods you eat and the vitamins you take with your care team. Some people who take this medication have severe bone, joint, or muscle pain. This medication may also increase your risk for jaw problems or a broken thigh bone. Tell your care team right away if you have severe pain in your jaw, bones, joints, or muscles. Tell your care team if you have any pain that does not go away or that gets worse. Talk to your care team if you may be pregnant. Serious birth defects can occur if you take this medication during pregnancy and for 5 months after the last dose. You will need a negative pregnancy test before starting this medication. Contraception is recommended while taking this medication and for 5 months after the last dose. Your care team can help you find the option that works for you. What side effects may I notice from receiving this medication? Side effects that you should report to your care team as soon as possible: Allergic reactions--skin rash, itching, hives, swelling of the face, lips, tongue, or throat Bone, joint, or muscle pain Low calcium level--muscle pain or cramps, confusion, tingling, or numbness in the hands or feet Osteonecrosis of the jaw--pain, swelling, or redness in the mouth, numbness of the jaw, poor healing after dental work, unusual discharge from the mouth, visible bones in the mouth Side effects that usually do not require medical attention (report to your care team if they continue or are bothersome): Cough Diarrhea Fatigue Headache Nausea This list may not describe all possible side effects. Call your doctor for medical advice about side effects. You may report side effects to FDA at 1-800-FDA-1088. Where should I keep  my medication? This medication is given in a hospital or clinic. It will not be stored at home. NOTE: This sheet is a summary. It may not cover all possible information. If you have questions about this medicine, talk to your doctor, pharmacist, or health care provider.  2023 Elsevier/Gold Standard (2021-07-13 00:00:00)   Daratumumab; Hyaluronidase Injection What is this medication? DARATUMUMAB; HYALURONIDASE (dar a toom ue mab; hye al ur ON i dase) treats multiple myeloma, a type of bone marrow cancer. Daratumumab works by blocking a protein that causes cancer cells to grow and multiply. This helps to slow or stop the spread of cancer cells. Hyaluronidase works by increasing the absorption of other medications in the body to help them work better. This medication may also be used treat amyloidosis, a condition that causes the buildup of a protein (amyloid) in your body. It works by reducing the buildup of this protein, which decreases symptoms. It is a combination medication that contains a monoclonal antibody. This medicine may be used for other purposes; ask your health care provider or pharmacist if you have questions. COMMON BRAND NAME(S): DARZALEX FASPRO What should I tell my care team before I take this medication? They need to know if you have any of these conditions: Heart disease   Infection, such as chickenpox, cold sores, herpes, hepatitis B Lung or breathing disease An unusual or allergic reaction to daratumumab, hyaluronidase, other medications, foods, dyes, or preservatives Pregnant or trying to get pregnant Breast-feeding How should I use this medication? This medication is injected under the skin. It is given by your care team in a hospital or clinic setting. Talk to your care team about the use of this medication in children. Special care may be needed. Overdosage: If you think you have taken too much of this medicine contact a poison control center or emergency room at  once. NOTE: This medicine is only for you. Do not share this medicine with others. What if I miss a dose? Keep appointments for follow-up doses. It is important not to miss your dose. Call your care team if you are unable to keep an appointment. What may interact with this medication? Interactions have not been studied. This list may not describe all possible interactions. Give your health care provider a list of all the medicines, herbs, non-prescription drugs, or dietary supplements you use. Also tell them if you smoke, drink alcohol, or use illegal drugs. Some items may interact with your medicine. What should I watch for while using this medication? Your condition will be monitored carefully while you are receiving this medication. This medication can cause serious allergic reactions. To reduce your risk, your care team may give you other medication to take before receiving this one. Be sure to follow the directions from your care team. This medication can affect the results of blood tests to match your blood type. These changes can last for up to 6 months after the final dose. Your care team will do blood tests to match your blood type before you start treatment. Tell all of your care team that you are being treated with this medication before receiving a blood transfusion. This medication can affect the results of some tests used to determine treatment response; extra tests may be needed to evaluate response. Talk to your care team if you wish to become pregnant or think you are pregnant. This medication can cause serious birth defects if taken during pregnancy and for 3 months after the last dose. A reliable form of contraception is recommended while taking this medication and for 3 months after the last dose. Talk to your care team about effective forms of contraception. Do not breast-feed while taking this medication. What side effects may I notice from receiving this medication? Side effects  that you should report to your care team as soon as possible: Allergic reactions--skin rash, itching, hives, swelling of the face, lips, tongue, or throat Heart rhythm changes--fast or irregular heartbeat, dizziness, feeling faint or lightheaded, chest pain, trouble breathing Infection--fever, chills, cough, sore throat, wounds that don't heal, pain or trouble when passing urine, general feeling of discomfort or being unwell Infusion reactions--chest pain, shortness of breath or trouble breathing, feeling faint or lightheaded Sudden eye pain or change in vision such as blurry vision, seeing halos around lights, vision loss Unusual bruising or bleeding Side effects that usually do not require medical attention (report to your care team if they continue or are bothersome): Constipation Diarrhea Fatigue Nausea Pain, tingling, or numbness in the hands or feet Swelling of the ankles, hands, or feet This list may not describe all possible side effects. Call your doctor for medical advice about side effects. You may report side effects to FDA at 1-800-FDA-1088. Where should I keep my medication? This medication is given   in a hospital or clinic. It will not be stored at home. NOTE: This sheet is a summary. It may not cover all possible information. If you have questions about this medicine, talk to your doctor, pharmacist, or health care provider.  2023 Elsevier/Gold Standard (2021-06-17 00:00:00)        To help prevent nausea and vomiting after your treatment, we encourage you to take your nausea medication as directed.  BELOW ARE SYMPTOMS THAT SHOULD BE REPORTED IMMEDIATELY: *FEVER GREATER THAN 100.4 F (38 C) OR HIGHER *CHILLS OR SWEATING *NAUSEA AND VOMITING THAT IS NOT CONTROLLED WITH YOUR NAUSEA MEDICATION *UNUSUAL SHORTNESS OF BREATH *UNUSUAL BRUISING OR BLEEDING *URINARY PROBLEMS (pain or burning when urinating, or frequent urination) *BOWEL PROBLEMS (unusual diarrhea, constipation,  pain near the anus) TENDERNESS IN MOUTH AND THROAT WITH OR WITHOUT PRESENCE OF ULCERS (sore throat, sores in mouth, or a toothache) UNUSUAL RASH, SWELLING OR PAIN  UNUSUAL VAGINAL DISCHARGE OR ITCHING   Items with * indicate a potential emergency and should be followed up as soon as possible or go to the Emergency Department if any problems should occur.  Please show the CHEMOTHERAPY ALERT CARD or IMMUNOTHERAPY ALERT CARD at check-in to the Emergency Department and triage nurse.  Should you have questions after your visit or need to cancel or reschedule your appointment, please contact [facility] [phone]  and follow the prompts.  Office hours are 8:00 a.m. to 4:30 p.m. Monday - Friday. Please note that voicemails left after 4:00 p.m. may not be returned until the following business day.  We are closed weekends and major holidays. You have access to a nurse at all times for urgent questions. Please call the main number to the clinic [phone] and follow the prompts.  For any non-urgent questions, you may also contact your provider using MyChart. We now offer e-Visits for anyone 18 and older to request care online for non-urgent symptoms. For details visit [email or web address].   Also download the MyChart app! Go to the app store, search "MyChart", open the app, select [facility], and log in with your MyChart username and password.  Masks are optional in the cancer centers. If you would like for your care team to wear a mask while they are taking care of you, please let them know. You may have one support person who is at least 80 years old accompany you for your appointments.  

## 2021-11-19 ENCOUNTER — Inpatient Hospital Stay: Payer: Medicare Other

## 2021-11-23 DIAGNOSIS — H90A22 Sensorineural hearing loss, unilateral, left ear, with restricted hearing on the contralateral side: Secondary | ICD-10-CM | POA: Diagnosis not present

## 2021-11-23 DIAGNOSIS — H90A31 Mixed conductive and sensorineural hearing loss, unilateral, right ear with restricted hearing on the contralateral side: Secondary | ICD-10-CM | POA: Diagnosis not present

## 2021-11-23 DIAGNOSIS — H9190 Unspecified hearing loss, unspecified ear: Secondary | ICD-10-CM | POA: Diagnosis not present

## 2021-11-25 ENCOUNTER — Inpatient Hospital Stay: Payer: Medicare Other

## 2021-11-25 DIAGNOSIS — Z5112 Encounter for antineoplastic immunotherapy: Secondary | ICD-10-CM | POA: Diagnosis not present

## 2021-11-25 DIAGNOSIS — C9000 Multiple myeloma not having achieved remission: Secondary | ICD-10-CM

## 2021-11-25 DIAGNOSIS — D649 Anemia, unspecified: Secondary | ICD-10-CM

## 2021-11-25 LAB — CBC WITH DIFFERENTIAL/PLATELET
Abs Immature Granulocytes: 0.02 10*3/uL (ref 0.00–0.07)
Basophils Absolute: 0 10*3/uL (ref 0.0–0.1)
Basophils Relative: 0 %
Eosinophils Absolute: 0 10*3/uL (ref 0.0–0.5)
Eosinophils Relative: 1 %
HCT: 21.1 % — ABNORMAL LOW (ref 36.0–46.0)
Hemoglobin: 6.8 g/dL — CL (ref 12.0–15.0)
Immature Granulocytes: 1 %
Lymphocytes Relative: 34 %
Lymphs Abs: 0.6 10*3/uL — ABNORMAL LOW (ref 0.7–4.0)
MCH: 30.4 pg (ref 26.0–34.0)
MCHC: 32.2 g/dL (ref 30.0–36.0)
MCV: 94.2 fL (ref 80.0–100.0)
Monocytes Absolute: 0.2 10*3/uL (ref 0.1–1.0)
Monocytes Relative: 14 %
Neutro Abs: 0.8 10*3/uL — ABNORMAL LOW (ref 1.7–7.7)
Neutrophils Relative %: 50 %
Platelets: 37 10*3/uL — ABNORMAL LOW (ref 150–400)
RBC: 2.24 MIL/uL — ABNORMAL LOW (ref 3.87–5.11)
RDW: 19.2 % — ABNORMAL HIGH (ref 11.5–15.5)
WBC: 1.6 10*3/uL — ABNORMAL LOW (ref 4.0–10.5)
nRBC: 0 % (ref 0.0–0.2)

## 2021-11-25 LAB — COMPREHENSIVE METABOLIC PANEL
ALT: 25 U/L (ref 0–44)
AST: 18 U/L (ref 15–41)
Albumin: 3.4 g/dL — ABNORMAL LOW (ref 3.5–5.0)
Alkaline Phosphatase: 103 U/L (ref 38–126)
Anion gap: 9 (ref 5–15)
BUN: 20 mg/dL (ref 8–23)
CO2: 23 mmol/L (ref 22–32)
Calcium: 8.2 mg/dL — ABNORMAL LOW (ref 8.9–10.3)
Chloride: 103 mmol/L (ref 98–111)
Creatinine, Ser: 0.77 mg/dL (ref 0.44–1.00)
GFR, Estimated: >60 mL/min (ref >60)
Glucose, Bld: 124 mg/dL — ABNORMAL HIGH (ref 70–99)
Potassium: 4.2 mmol/L (ref 3.5–5.1)
Sodium: 135 mmol/L (ref 135–145)
Total Bilirubin: 0.7 mg/dL (ref 0.3–1.2)
Total Protein: 6.6 g/dL (ref 6.5–8.1)

## 2021-11-25 LAB — SAMPLE TO BLOOD BANK

## 2021-11-25 LAB — LACTATE DEHYDROGENASE: LDH: 92 U/L — ABNORMAL LOW (ref 98–192)

## 2021-11-25 LAB — PREPARE RBC (CROSSMATCH)

## 2021-11-25 LAB — MAGNESIUM: Magnesium: 2.4 mg/dL (ref 1.7–2.4)

## 2021-11-25 MED ORDER — DARATUMUMAB-HYALURONIDASE-FIHJ 1800-30000 MG-UT/15ML ~~LOC~~ SOLN
1800.0000 mg | Freq: Once | SUBCUTANEOUS | Status: AC
  Administered 2021-11-25: 1800 mg via SUBCUTANEOUS
  Filled 2021-11-25: qty 15

## 2021-11-25 NOTE — Patient Instructions (Signed)
[facility]  Discharge Instructions: Thank you for choosing [facility] to provide your oncology and hematology care.  If you have a lab appointment with the [facility], please come in thru the Main Entrance and check in at the main information desk.  Wear comfortable clothing and clothing appropriate for easy access to any Portacath or PICC line.   We strive to give you quality time with your provider. You may need to reschedule your appointment if you arrive late (15 or more minutes).  Arriving late affects you and other patients whose appointments are after yours.  Also, if you miss three or more appointments without notifying the office, you may be dismissed from the clinic at the provider's discretion.      For prescription refill requests, have your pharmacy contact our office and allow 72 hours for refills to be completed.    Today you received the following chemotherapy and/or immunotherapy agents Dara SQ injection.   To help prevent nausea and vomiting after your treatment, we encourage you to take your nausea medication as directed.  Daratumumab Injection What is this medication? DARATUMUMAB (dar a toom ue mab) treats multiple myeloma, a type of bone marrow cancer. It works by helping your immune system slow or stop the spread of cancer cells. It is a monoclonal antibody. This medicine may be used for other purposes; ask your health care provider or pharmacist if you have questions. COMMON BRAND NAME(S): DARZALEX What should I tell my care team before I take this medication? They need to know if you have any of these conditions: Hereditary fructose intolerance Infection, such as chickenpox, herpes, hepatitis B virus Lung or breathing disease, such as asthma, COPD An unusual or allergic reaction to daratumumab, sorbitol, other medications, foods, dyes, or preservatives Pregnant or trying to get pregnant Breast-feeding How should I use this  medication? This medication is injected into a vein. It is given by your care team in a hospital or clinic setting. Talk to your care team about the use of this medication in children. Special care may be needed. Overdosage: If you think you have taken too much of this medicine contact a poison control center or emergency room at once. NOTE: This medicine is only for you. Do not share this medicine with others. What if I miss a dose? Keep appointments for follow-up doses. It is important not to miss your dose. Call your care team if you are unable to keep an appointment. What may interact with this medication? Interactions have not been studied. This list may not describe all possible interactions. Give your health care provider a list of all the medicines, herbs, non-prescription drugs, or dietary supplements you use. Also tell them if you smoke, drink alcohol, or use illegal drugs. Some items may interact with your medicine. What should I watch for while using this medication? Your condition will be monitored carefully while you are receiving this medication. This medication can cause serious allergic reactions. To reduce your risk, your care team may give you other medication to take before receiving this one. Be sure to follow the directions from your care team. This medication can affect the results of blood tests to match your blood type. These changes can last for up to 6 months after the final dose. Your care team will do blood tests to match your blood type before you start treatment. Tell all of your care team that you are being treated with this medication before  receiving a blood transfusion. This medication can affect the results of some tests used to determine treatment response; extra tests may be needed to evaluate response. Talk to your care team if you wish to become pregnant or think you are pregnant. This medication can cause serious birth defects if taken during pregnancy and for  3 months after the last dose. A reliable form of contraception is recommended while taking this medication and for 3 months after the last dose. Talk to your care team about effective forms of contraception. Do not breast-feed while taking this medication. What side effects may I notice from receiving this medication? Side effects that you should report to your care team as soon as possible: Allergic reactions--skin rash, itching, hives, swelling of the face, lips, tongue, or throat Infection--fever, chills, cough, sore throat, wounds that don't heal, pain or trouble when passing urine, general feeling of discomfort or being unwell Infusion reactions--chest pain, shortness of breath or trouble breathing, feeling faint or lightheaded Unusual bruising or bleeding Side effects that usually do not require medical attention (report to your care team if they continue or are bothersome): Constipation Diarrhea Fatigue Nausea Pain, tingling, or numbness in the hands or feet Swelling of the ankles, hands, or feet This list may not describe all possible side effects. Call your doctor for medical advice about side effects. You may report side effects to FDA at 1-800-FDA-1088. Where should I keep my medication? This medication is given in a hospital or clinic. It will not be stored at home. NOTE: This sheet is a summary. It may not cover all possible information. If you have questions about this medicine, talk to your doctor, pharmacist, or health care provider.  2023 Elsevier/Gold Standard (2014-01-21 00:00:00)   BELOW ARE SYMPTOMS THAT SHOULD BE REPORTED IMMEDIATELY: *FEVER GREATER THAN 100.4 F (38 C) OR HIGHER *CHILLS OR SWEATING *NAUSEA AND VOMITING THAT IS NOT CONTROLLED WITH YOUR NAUSEA MEDICATION *UNUSUAL SHORTNESS OF BREATH *UNUSUAL BRUISING OR BLEEDING *URINARY PROBLEMS (pain or burning when urinating, or frequent urination) *BOWEL PROBLEMS (unusual diarrhea, constipation, pain near the  anus) TENDERNESS IN MOUTH AND THROAT WITH OR WITHOUT PRESENCE OF ULCERS (sore throat, sores in mouth, or a toothache) UNUSUAL RASH, SWELLING OR PAIN  UNUSUAL VAGINAL DISCHARGE OR ITCHING   Items with * indicate a potential emergency and should be followed up as soon as possible or go to the Emergency Department if any problems should occur.  Please show the CHEMOTHERAPY ALERT CARD or IMMUNOTHERAPY ALERT CARD at check-in to the Emergency Department and triage nurse.  Should you have questions after your visit or need to cancel or reschedule your appointment, please contact [facility] [phone]  and follow the prompts.  Office hours are 8:00 a.m. to 4:30 p.m. Monday - Friday. Please note that voicemails left after 4:00 p.m. may not be returned until the following business day.  We are closed weekends and major holidays. You have access to a nurse at all times for urgent questions. Please call the main number to the clinic [phone] and follow the prompts.  For any non-urgent questions, you may also contact your provider using MyChart. We now offer e-Visits for anyone 61 and older to request care online for non-urgent symptoms. For details visit [email or web address].   Also download the MyChart app! Go to the app store, search "MyChart", open the app, select [facility], and log in with your MyChart username and password.  Masks are optional in the cancer centers. If  you would like for your care team to wear a mask while they are taking care of you, please let them know. You may have one support person who is at least 80 years old accompany you for your appointments.

## 2021-11-25 NOTE — Progress Notes (Signed)
CRITICAL VALUE ALERT Critical value received:  hgb 6.8 Date of notification:  11-25-21 Time of notification: 0923 Critical value read back:  Yes.   Nurse who received alert:  C. Annora Guderian RN MD notified time and response:  1100 . Dr Alen Blew, will give 2 units tomorrow due to patient having antibodies per MD.

## 2021-11-25 NOTE — Progress Notes (Signed)
Pt presents today for Dara SQ per provider's order. Vital signs and other labs WNL for treatment. Pt's hemoglobin is 6.8, platelets 37, and ANC 0.8 today. Message sent to Dr. Alen Blew, and he stated to give 2 units of blood tomorrow. Okay to proceed with treatment today per Dr. Alen Blew.  Pt took pre-meds at home prior to arrival.  Dara SQ given today per MD orders. Tolerated infusion without adverse affects. Vital signs stable. No complaints at this time. Discharged from clinic via wheelchair in stable condition. Alert and oriented x 3. F/U with [facility] as scheduled.

## 2021-11-26 ENCOUNTER — Inpatient Hospital Stay: Payer: Medicare Other

## 2021-11-26 DIAGNOSIS — C9000 Multiple myeloma not having achieved remission: Secondary | ICD-10-CM

## 2021-11-26 DIAGNOSIS — Z5112 Encounter for antineoplastic immunotherapy: Secondary | ICD-10-CM | POA: Diagnosis not present

## 2021-11-26 MED ORDER — SODIUM CHLORIDE 0.9% IV SOLUTION
250.0000 mL | Freq: Once | INTRAVENOUS | Status: AC
  Administered 2021-11-26: 250 mL via INTRAVENOUS

## 2021-11-26 NOTE — Progress Notes (Signed)
UNMATCHED BLOOD PRODUCT NOTE  Compare the patient ID on the blood tag to the patient ID on the hospital armband and Blood Bank armband. Then confirm the unit number on the blood tag matches the unit number on the blood product.  If a discrepancy is discovered return the product to blood bank immediately.   Blood Product Type: Packed Red Blood Cells  Unit #: (Found on blood product bag, begins with W) [id number]  Product Code #: (Found on blood product bag, begins with E)    [id number]   Start Time: 1200  Starting Rate: 120 ml/hr  Rate increase/decreased  (if applicable):      ml/hr  225  Rate changed time (if applicable): 7159   Stop Time: 1325   All Other Documentation should be documented within the Blood Admin Flowsheet per policy.   Blood checked off today with Benjiman Core RN.  One unit of blood given today per orders. Patient tolerated it well without problems. Vitals stable and discharged home from clinic via wheelchair. Follow up as scheduled.

## 2021-11-26 NOTE — Patient Instructions (Signed)
[facility]  Discharge Instructions: Thank you for choosing [facility] to provide your oncology and hematology care.  If you have a lab appointment with the [facility], please come in thru the Main Entrance and check in at the main information desk.  Wear comfortable clothing and clothing appropriate for easy access to any Portacath or PICC line.   We strive to give you quality time with your provider. You may need to reschedule your appointment if you arrive late (15 or more minutes).  Arriving late affects you and other patients whose appointments are after yours.  Also, if you miss three or more appointments without notifying the office, you may be dismissed from the clinic at the provider's discretion.      For prescription refill requests, have your pharmacy contact our office and allow 72 hours for refills to be completed.    Today you received the following, 2 units of blood today      To help prevent nausea and vomiting after your treatment, we encourage you to take your nausea medication as directed.  BELOW ARE SYMPTOMS THAT SHOULD BE REPORTED IMMEDIATELY: *FEVER GREATER THAN 100.4 F (38 C) OR HIGHER *CHILLS OR SWEATING *NAUSEA AND VOMITING THAT IS NOT CONTROLLED WITH YOUR NAUSEA MEDICATION *UNUSUAL SHORTNESS OF BREATH *UNUSUAL BRUISING OR BLEEDING *URINARY PROBLEMS (pain or burning when urinating, or frequent urination) *BOWEL PROBLEMS (unusual diarrhea, constipation, pain near the anus) TENDERNESS IN MOUTH AND THROAT WITH OR WITHOUT PRESENCE OF ULCERS (sore throat, sores in mouth, or a toothache) UNUSUAL RASH, SWELLING OR PAIN  UNUSUAL VAGINAL DISCHARGE OR ITCHING   Items with * indicate a potential emergency and should be followed up as soon as possible or go to the Emergency Department if any problems should occur.  Please show the CHEMOTHERAPY ALERT CARD or IMMUNOTHERAPY ALERT CARD at check-in to the Emergency Department and triage  nurse.  Should you have questions after your visit or need to cancel or reschedule your appointment, please contact [facility] [phone]  and follow the prompts.  Office hours are 8:00 a.m. to 4:30 p.m. Monday - Friday. Please note that voicemails left after 4:00 p.m. may not be returned until the following business day.  We are closed weekends and major holidays. You have access to a nurse at all times for urgent questions. Please call the main number to the clinic [phone] and follow the prompts.  For any non-urgent questions, you may also contact your provider using MyChart. We now offer e-Visits for anyone 89 and older to request care online for non-urgent symptoms. For details visit [email or web address].   Also download the MyChart app! Go to the app store, search "MyChart", open the app, select [facility], and log in with your MyChart username and password.  Masks are optional in the cancer centers. If you would like for your care team to wear a mask while they are taking care of you, please let them know. You may have one support person who is at least 80 years old accompany you for your appointments.

## 2021-11-27 LAB — TYPE AND SCREEN
ABO/RH(D): O POS
Antibody Screen: POSITIVE
Donor AG Type: NEGATIVE
Donor AG Type: NEGATIVE
Unit division: 0
Unit division: 0

## 2021-11-27 LAB — BPAM RBC
Blood Product Expiration Date: 202310072359
Blood Product Expiration Date: 202310192359
ISSUE DATE / TIME: 202309211147
Unit Type and Rh: 5100
Unit Type and Rh: 600

## 2021-11-30 LAB — IMMUNOFIXATION ELECTROPHORESIS
IgA: 477 mg/dL — ABNORMAL HIGH (ref 64–422)
IgG (Immunoglobin G), Serum: 212 mg/dL — ABNORMAL LOW (ref 586–1602)
IgM (Immunoglobulin M), Srm: 6 mg/dL — ABNORMAL LOW (ref 26–217)
Total Protein ELP: 5.9 g/dL — ABNORMAL LOW (ref 6.0–8.5)

## 2021-11-30 LAB — PROTEIN ELECTROPHORESIS, SERUM
A/G Ratio: 1.2 (ref 0.7–1.7)
Albumin ELP: 3.2 g/dL (ref 2.9–4.4)
Alpha-1-Globulin: 0.4 g/dL (ref 0.0–0.4)
Alpha-2-Globulin: 0.9 g/dL (ref 0.4–1.0)
Beta Globulin: 1.3 g/dL (ref 0.7–1.3)
Gamma Globulin: 0.2 g/dL — ABNORMAL LOW (ref 0.4–1.8)
Globulin, Total: 2.7 g/dL (ref 2.2–3.9)
M-Spike, %: 0.5 g/dL — ABNORMAL HIGH
Total Protein ELP: 5.9 g/dL — ABNORMAL LOW (ref 6.0–8.5)

## 2021-12-02 ENCOUNTER — Inpatient Hospital Stay: Payer: Medicare Other

## 2021-12-02 ENCOUNTER — Inpatient Hospital Stay (HOSPITAL_BASED_OUTPATIENT_CLINIC_OR_DEPARTMENT_OTHER): Payer: Medicare Other | Admitting: Hematology

## 2021-12-02 DIAGNOSIS — C9000 Multiple myeloma not having achieved remission: Secondary | ICD-10-CM

## 2021-12-02 DIAGNOSIS — D649 Anemia, unspecified: Secondary | ICD-10-CM

## 2021-12-02 DIAGNOSIS — Z5112 Encounter for antineoplastic immunotherapy: Secondary | ICD-10-CM | POA: Diagnosis not present

## 2021-12-02 LAB — SAMPLE TO BLOOD BANK

## 2021-12-02 LAB — MAGNESIUM: Magnesium: 2.3 mg/dL (ref 1.7–2.4)

## 2021-12-02 LAB — CBC WITH DIFFERENTIAL/PLATELET
Abs Immature Granulocytes: 0.07 10*3/uL (ref 0.00–0.07)
Basophils Absolute: 0 10*3/uL (ref 0.0–0.1)
Basophils Relative: 0 %
Eosinophils Absolute: 0 10*3/uL (ref 0.0–0.5)
Eosinophils Relative: 0 %
HCT: 28.2 % — ABNORMAL LOW (ref 36.0–46.0)
Hemoglobin: 8.9 g/dL — ABNORMAL LOW (ref 12.0–15.0)
Immature Granulocytes: 3 %
Lymphocytes Relative: 36 %
Lymphs Abs: 0.8 10*3/uL (ref 0.7–4.0)
MCH: 29.3 pg (ref 26.0–34.0)
MCHC: 31.6 g/dL (ref 30.0–36.0)
MCV: 92.8 fL (ref 80.0–100.0)
Monocytes Absolute: 0.3 10*3/uL (ref 0.1–1.0)
Monocytes Relative: 14 %
Neutro Abs: 1.1 10*3/uL — ABNORMAL LOW (ref 1.7–7.7)
Neutrophils Relative %: 47 %
RBC: 3.04 MIL/uL — ABNORMAL LOW (ref 3.87–5.11)
RDW: 18.3 % — ABNORMAL HIGH (ref 11.5–15.5)
Smear Review: UNDETERMINED
WBC: 2.3 10*3/uL — ABNORMAL LOW (ref 4.0–10.5)
nRBC: 0 % (ref 0.0–0.2)

## 2021-12-02 LAB — COMPREHENSIVE METABOLIC PANEL
ALT: 20 U/L (ref 0–44)
AST: 19 U/L (ref 15–41)
Albumin: 3.5 g/dL (ref 3.5–5.0)
Alkaline Phosphatase: 82 U/L (ref 38–126)
Anion gap: 7 (ref 5–15)
BUN: 21 mg/dL (ref 8–23)
CO2: 26 mmol/L (ref 22–32)
Calcium: 8.8 mg/dL — ABNORMAL LOW (ref 8.9–10.3)
Chloride: 102 mmol/L (ref 98–111)
Creatinine, Ser: 0.69 mg/dL (ref 0.44–1.00)
GFR, Estimated: >60 mL/min (ref >60)
Glucose, Bld: 104 mg/dL — ABNORMAL HIGH (ref 70–99)
Potassium: 4 mmol/L (ref 3.5–5.1)
Sodium: 135 mmol/L (ref 135–145)
Total Bilirubin: 0.6 mg/dL (ref 0.3–1.2)
Total Protein: 6.4 g/dL — ABNORMAL LOW (ref 6.5–8.1)

## 2021-12-02 MED ORDER — DARATUMUMAB-HYALURONIDASE-FIHJ 1800-30000 MG-UT/15ML ~~LOC~~ SOLN
1800.0000 mg | Freq: Once | SUBCUTANEOUS | Status: AC
  Administered 2021-12-02: 1800 mg via SUBCUTANEOUS
  Filled 2021-12-02: qty 15

## 2021-12-02 NOTE — Patient Instructions (Signed)
[facility]  Discharge Instructions  You were seen and examined today by Dr. Katragadda.  Proceed with treatment as scheduled.  Follow-up as scheduled.  Thank you for choosing [facility] to provide your oncology and hematology care.   To afford each patient quality time with our provider, please arrive at least 15 minutes before your scheduled appointment time. You may need to reschedule your appointment if you arrive late (10 or more minutes). Arriving late affects you and other patients whose appointments are after yours.  Also, if you miss three or more appointments without notifying the office, you may be dismissed from the clinic at the provider's discretion.    Again, thank you for choosing [facility].  Our hope is that these requests will decrease the amount of time that you wait before being seen by our physicians.   If you have a lab appointment with the Cancer Center please come in thru the Main Entrance and check in at the main information desk.           _____________________________________________________________  Should you have questions after your visit to  Cancer Center, please contact our office at [phone] and follow the prompts.  Our office hours are 8:00 a.m. to 4:30 p.m. Monday - Thursday and 8:00 a.m. to 2:30 p.m. Friday.  Please note that voicemails left after 4:00 p.m. may not be returned until the following business day.  We are closed weekends and all major holidays.  You do have access to a nurse 24-7, just call the main number to the clinic [phone] and do not press any options, hold on the line and a nurse will answer the phone.    For prescription refill requests, have your pharmacy contact our office and allow 72 hours.    Masks are optional in the cancer centers. If you would like for your care team to wear a mask while they are taking care of you, please let them know.  You may have one support person who is at least 80 years old accompany you for your appointments.  

## 2021-12-02 NOTE — Progress Notes (Signed)
[address], [place] [address]   CLINIC:  Medical Oncology/Hematology  PCP:  [name], MD [address] / [place] [address] [phone]   REASON FOR VISIT:  Follow-up for IgA kappa plasma cell myeloma  PRIOR THERAPY: Velcade x 9 cycles from 07/04/2019 to 01/10/2020  CURRENT THERAPY: Revlimid 15 mg 2/3 weeks, weekly Darzalex; Xgeva monthly  BRIEF ONCOLOGIC HISTORY:  Oncology History  Multiple myeloma not having achieved remission ([facility])  06/07/2019 Initial Diagnosis   Multiple myeloma not having achieved remission ([facility])   07/04/2019 - 01/10/2020 Chemotherapy   The patient had dexamethasone (DECADRON) 4 MG tablet, 1 of 1 cycle, Start date: 01/10/2020, End date: -- lenalidomide (REVLIMID) 15 MG capsule, 1 of 1 cycle, Start date: 02/26/2020, End date: -- bortezomib SQ (VELCADE) chemo injection 2.25 mg, 1.3 mg/m2 = 2.25 mg, Subcutaneous,  Once, 9 of 10 cycles Administration: 2.25 mg (07/04/2019), 2.25 mg (07/11/2019), 2.25 mg (07/26/2019), 2.25 mg (08/02/2019), 2.25 mg (08/09/2019), 2.25 mg (08/16/2019), 2.25 mg (08/23/2019), 2.25 mg (08/30/2019), 2.25 mg (09/06/2019), 2.25 mg (09/13/2019), 2.25 mg (09/20/2019), 2.25 mg (09/27/2019), 2.25 mg (10/04/2019), 2.25 mg (10/11/2019), 2.25 mg (10/18/2019), 2.25 mg (10/25/2019), 2.25 mg (11/01/2019), 2.25 mg (11/08/2019), 2.25 mg (11/15/2019), 2.25 mg (11/22/2019), 2.25 mg (11/29/2019), 2.25 mg (12/06/2019), 2.25 mg (12/13/2019), 2.25 mg (12/20/2019), 2.25 mg (12/27/2019), 2.25 mg (01/03/2020), 2.25 mg (01/10/2020)  for chemotherapy treatment.    09/15/2021 - 11/04/2021 Chemotherapy   Patient is on Treatment Plan : MYELOMA RELAPSED REFRACTORY Daratumumab SQ + Pomalidomide + Dexamethasone (DaraPd) q28d     09/15/2021 -  Chemotherapy   Patient is on Treatment Plan : MYELOMA RELAPSED REFRACTORY Daratumumab SQ + Pomalidomide + Dexamethasone (DaraPd) q28d       CANCER STAGING:  Cancer Staging  No matching staging information was  found for the patient.  INTERVAL HISTORY:  Ms. NAINIKA NEWLUN, a 81 y.o. female, seen for follow-up of multiple myeloma and severe cytopenias.  She had started Pomalyst next cycle on 12/01/2021.  Denies any bleeding issues.  Received 1 unit of PRBC last week.  Energy levels have improved to 80%.   REVIEW OF SYSTEMS:  Review of Systems  Constitutional:  Negative for appetite change and fatigue.  HENT:   Negative for nosebleeds.   Respiratory:  Negative for cough.   Gastrointestinal:  Negative for blood in stool.  All other systems reviewed and are negative.   PAST MEDICAL/SURGICAL HISTORY:  Past Medical History:  Diagnosis Date   Atrial fibrillation ([place] [place]) 2011   Postop, spontaneous conversion to normal sinus after one hour   Compression fracture 07/24/2009   T12; kyphoplasty   History of echocardiogram 07/2009   EF 65%   Hypertension    Thyroid disease    Tobacco abuse    Past Surgical History:  Procedure Laterality Date   BACK SURGERY     BACK SURGERY  06/06/2015   BREAST EXCISIONAL BIOPSY Left    50 years ago  benign   CHOLECYSTECTOMY N/A 04/25/2015   Procedure: LAPAROSCOPIC CHOLECYSTECTOMY WITH INTRAOPERATIVE CHOLANGIOGRAM;  Surgeon: Mickeal Skinner, MD;  Location: [facility];  Service: General;  Laterality: N/A;   COLONOSCOPY N/A 11/26/2015   Procedure: COLONOSCOPY;  Surgeon: Daneil Dolin, MD;  Location: [facility];  Service: Endoscopy;  Laterality: N/A;  7:30 am   ERCP N/A 04/14/2015   Procedure: ENDOSCOPIC RETROGRADE CHOLANGIOPANCREATOGRAPHY (ERCP) Biliary Sphincterotomy, 10x7 stent placement Dilated bilary system just not well seen;  Surgeon: Rogene Houston,  MD;  Location: [facility];  Service: Endoscopy;  Laterality: N/A;   ERCP N/A 06/12/2015   Procedure: ENDOSCOPIC RETROGRADE CHOLANGIOPANCREATOGRAPHY (ERCP);  Surgeon: Rogene Houston, MD;  Location: [facility];  Service: Endoscopy;  Laterality: N/A;   ESOPHAGOGASTRODUODENOSCOPY N/A 06/12/2015   Procedure: DIAGNOSTIC  ESOPHAGOGASTRODUODENOSCOPY (EGD);  Surgeon: Rogene Houston, MD;  Location: [facility];  Service: Endoscopy;  Laterality: N/A;   FEMUR IM NAIL Right 05/11/2019   Procedure: RETROGRADE INTRAMEDULLARY NAIL FEMORAL;  Surgeon: Shona Needles, MD;  Location: [facility];  Service: Orthopedics;  Laterality: Right;   STENT REMOVAL  06/12/2015   Procedure: STENT REMOVAL ;  Surgeon: Rogene Houston, MD;  Location: [facility];  Service: Endoscopy;;    SOCIAL HISTORY:  Social History   Socioeconomic History   Marital status: Widowed    Spouse name: Not on file   Number of children: 2   Years of education: Not on file   Highest education level: Not on file  Occupational History   Occupation: retired  Tobacco Use   Smoking status: Former    Packs/day: 0.15    Years: 20.00    Total pack years: 3.00    Types: Cigarettes    Quit date: 03/08/2013    Years since quitting: 8.7   Smokeless tobacco: Never  Vaping Use   Vaping Use: Never used  Substance and Sexual Activity   Alcohol use: No    Alcohol/week: 0.0 standard drinks of alcohol   Drug use: No   Sexual activity: Not Currently  Other Topics Concern   Not on file  Social History Narrative   Active in gardens and does yard work.   Social Determinants of Health   Financial Resource Strain: Low Risk  (05/08/2019)   Overall Financial Resource Strain (CARDIA)    Difficulty of Paying Living Expenses: Not hard at all  Food Insecurity: No Food Insecurity (12/28/2019)   Hunger Vital Sign    Worried About Running Out of Food in the Last Year: Never true    Ran Out of Food in the Last Year: Never true  Transportation Needs: No Transportation Needs (06/14/2019)   PRAPARE - Hydrologist (Medical): No    Lack of Transportation (Non-Medical): No  Physical Activity: Inactive (05/08/2019)   Exercise Vital Sign    Days of Exercise per Week: 0 days    Minutes of Exercise per Session: 0 min  Stress: No Stress Concern Present  (05/08/2019)   [place]    Feeling of Stress : Not at all  Social Connections: Moderately Isolated (02/06/2020)   Social Connection and Isolation Panel [NHANES]    Frequency of Communication with Friends and Family: More than three times a week    Frequency of Social Gatherings with Friends and Family: More than three times a week    Attends Religious Services: More than 4 times per year    Active Member of [employer] or Organizations: No    Attends Archivist Meetings: Never    Marital Status: Widowed  Intimate Partner Violence: Not At Risk (05/08/2019)   Humiliation, Afraid, Rape, and Kick questionnaire    Fear of Current or Ex-Partner: No    Emotionally Abused: No    Physically Abused: No    Sexually Abused: No    FAMILY HISTORY:  Family History  Problem Relation Age of Onset   Heart attack Father    Stroke Father  Breast cancer Sister    Thyroid disease Neg Hx    Colon cancer Neg Hx     CURRENT MEDICATIONS:  Current Outpatient Medications  Medication Sig Dispense Refill   acyclovir (ZOVIRAX) 400 MG tablet TAKE (1) TABLET BY MOUTH TWICE DAILY. (Patient taking differently: Take 400 mg by mouth 2 (two) times daily.) 60 tablet 6   CALCIUM PO Take 1 capsule by mouth daily.     carboxymethylcellulose (REFRESH PLUS) 0.5 % SOLN Place 1 drop into both eyes in the morning, at noon, in the evening, and at bedtime.     carboxymethylcellulose (REFRESH PLUS) 0.5 % SOLN Apply to eye.     Cholecalciferol (VITAMIN D) 125 MCG (5000 UT) CAPS Take 5,000 Units by mouth daily. 90 capsule 1   Cyanocobalamin (B-12 PO) Take 1 capsule by mouth daily.     dexamethasone (DECADRON) 4 MG tablet Take 8 mg by mouth once a week.     Ferrous Sulfate (IRON PO) Take 1 capsule by mouth daily.     fluconazole (DIFLUCAN) 100 MG tablet      levothyroxine (SYNTHROID) 137 MCG tablet Take 1 tablet (137 mcg total) by mouth daily before breakfast.  90 tablet 3   montelukast (SINGULAIR) 10 MG tablet Take one tablet by mouth 1 hour prior to Darzalex injection appointments 30 tablet 2   Multiple Vitamin (MULTIVITAMIN WITH MINERALS) TABS tablet Take 1 tablet by mouth daily.     ondansetron (ZOFRAN) 8 MG tablet Take 1 tablet (8 mg total) by mouth every 8 (eight) hours as needed for nausea or vomiting. 60 tablet 0   pomalidomide (POMALYST) 2 MG capsule Take 1 capsule (2 mg total) by mouth daily. Take for 21 days, then hold for 7 days. Repeat every 28 days. 21 capsule 0   potassium chloride SA (KLOR-CON M) 20 MEQ tablet Take 1 tablet (20 mEq total) by mouth as needed. Take Potassium 40 meq by mouth then wait three hours and take 20 meq by mouth and wait three hours and take 40 meq by mouth for a total of Potassium 100 meq. 5 tablet 1   SSD 1 % cream Apply 1 Application topically daily.     No current facility-administered medications for this visit.   Facility-Administered Medications Ordered in Other Visits  Medication Dose Route Frequency Provider Last Rate Last Admin   0.9 %  sodium chloride infusion (Manually program via Guardrails IV Fluids)  250 mL Intravenous Once Derek Jack, MD       0.9 %  sodium chloride infusion   Intravenous Continuous Derek Jack, MD   Stopped at 09/15/21 1454    ALLERGIES:  No Known Allergies  PHYSICAL EXAM:  Performance status (ECOG): 1 - Symptomatic but completely ambulatory  There were no vitals filed for this visit.  Wt Readings from Last 3 Encounters:  11/18/21 127 lb 12.8 oz (58 kg)  11/11/21 132 lb 11.5 oz (60.2 kg)  11/04/21 128 lb 1.4 oz (58.1 kg)   Physical Exam Vitals reviewed.  Constitutional:      Appearance: Normal appearance.  Cardiovascular:     Rate and Rhythm: Normal rate and regular rhythm.     Pulses: Normal pulses.     Heart sounds: Normal heart sounds.  Pulmonary:     Effort: Pulmonary effort is normal.     Breath sounds: Normal breath sounds.   Neurological:     General: No focal deficit present.     Mental Status: She is alert and oriented  to person, place, and time.  Psychiatric:        Mood and Affect: Mood normal.        Behavior: Behavior normal.  Blood in the back of the throat.  LABORATORY DATA:  I have reviewed the labs as listed.     Latest Ref Rng & Units 11/25/2021    9:43 AM 11/18/2021   12:07 PM 11/11/2021    8:08 AM  CBC  WBC 4.0 - 10.5 K/uL 1.6  1.5  2.5   Hemoglobin 12.0 - 15.0 g/dL 6.8  7.8  8.1   Hematocrit 36.0 - 46.0 % 21.1  24.1  25.3   Platelets 150 - 400 K/uL 37  71  100       Latest Ref Rng & Units 12/02/2021    9:28 AM 11/25/2021    9:43 AM 11/18/2021   12:07 PM  CMP  Glucose 70 - 99 mg/dL 104  124  128   BUN 8 - 23 mg/dL 21  20  23    Creatinine 0.44 - 1.00 mg/dL 0.69  0.77  0.77   Sodium 135 - 145 mmol/L 135  135  132   Potassium 3.5 - 5.1 mmol/L 4.0  4.2  4.2   Chloride 98 - 111 mmol/L 102  103  98   CO2 22 - 32 mmol/L 26  23  27    Calcium 8.9 - 10.3 mg/dL 8.8  8.2  9.0   Total Protein 6.5 - 8.1 g/dL 6.4  6.6  7.0   Total Bilirubin 0.3 - 1.2 mg/dL 0.6  0.7  0.7   Alkaline Phos 38 - 126 U/L 82  103  120   AST 15 - 41 U/L 19  18  17    ALT 0 - 44 U/L 20  25  29      DIAGNOSTIC IMAGING:  I have independently reviewed the scans and discussed with the patient. No results found.   ASSESSMENT:  1.  IgA kappa plasma cell myeloma: -IgA kappa plasma cell myeloma diagnosed on right sacral bone biopsy on 05/17/2019.  SPEP did not show M spike.  Immunofixation shows IgA kappa.  Kappa light chains 42.8, lambda light chains at 9.9, ratio 4.32.  Beta-2 microglobulin 3.7. -PET scan on 06/04/2019 showed expansile lytic lesion involving the right sacral wing.  Lytic lesion involving T10 vertebral body.  Left seventh rib lesion.  Increased uptake in a pathological fracture involving distal aspect of the right femur. 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 -RVD started on 06/26/2019.  Revlimid 15 mg 2 weeks on/1 week off. -Velcade discontinued on 01/10/2020. -She is taking Revlimid 15 mg 2 weeks on 1 week off, with dexamethasone 20 mg on weeks of Revlimid and 10 mg while she is off Revlimid.  Revlimid dose further reduced to 10 mg 3 weeks on/1 week off.  She has developed progressive anemia. - BMBX (08/07/2021): 38% plasma cells in the marrow.  Cytogenetics was normal.  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 - DPD regimen started on, single agent Darzalex and dexamethasone started on 09/15/2021.  Daratumumab held after first treatment due to pneumonia. - Pomalidomide 2 mg 3 weeks on/1 week off and dexamethasone 20 mg weekly started on 10/06/2021.  Daratumumab added on 10/21/2021.   PLAN:  1.  Relapsed IgA kappa plasma cell myeloma: - She is tolerating pomalidomide 2 mg 3 weeks on/1 week off very  well.  Dexamethasone will be 10 mg on Tuesday and Wednesday. - She reported occasional dark stools.  She is also taking iron tablet daily. - No other bleeding issues reported.  She has good energy levels.  Labs today showed hemoglobin 8.9.  White count improved to 2.3 and ANC of 1.1.  Platelet count is clumped.  Platelet count 2 weeks ago has improved to 100.  M spike has improved to 0.5 g from 0.6 g previously.  Free light chains were not done. - I have recommended continuing Darzalex.  Pomalidomide was started on 12/01/2021, cycle 3. - Because of dark stools, recommend stool for occult blood test.  RTC 4 weeks for follow-up with repeat myeloma labs.   2.  Hypokalemia: - Continue potassium daily.  Potassium normal.   3.  Normocytic anemia: - Bone marrow infiltration and myelosuppression from treatment.  Hemoglobin today is 8.9.  Last transfusion was last week.   4.  ID prophylaxis: - Continue acyclovir twice daily.  Continue to hold aspirin.   5.  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   Orders placed  this encounter:  No orders of the defined types were placed in this encounter.    Derek Jack, MD [facility] [phone]

## 2021-12-02 NOTE — Progress Notes (Signed)
Pt presents today Dara SQ per provider's order. Vital signs and other labs WNL for treatment today. Pt's ANC is 1.1 and platetets were unable to estimate. Okay to proceed with treatment today per Dr.K. Message sent to Dr.K asking if pt needed platelets. No platelets needed today, pt will be re-checked next week per Dr.K.  Pt took pre-meds at home prior to arrival  Dara SQ given today per MD orders. Tolerated infusion without adverse affects. Vital signs stable. No complaints at this time. Discharged from clinic via wheelchair in stable condition. Alert and oriented x 3. F/U with [facility] as scheduled.

## 2021-12-02 NOTE — Progress Notes (Signed)
Patient has been assessed, vital signs and labs have been reviewed by Dr. Katragadda. ANC, Creatinine, LFTs, and Platelets are within treatment parameters per Dr. Katragadda. The patient is good to proceed with treatment at this time. Primary RN and pharmacy aware.  

## 2021-12-03 ENCOUNTER — Inpatient Hospital Stay: Payer: Medicare Other

## 2021-12-03 DIAGNOSIS — Z23 Encounter for immunization: Secondary | ICD-10-CM | POA: Diagnosis not present

## 2021-12-08 ENCOUNTER — Other Ambulatory Visit: Payer: Self-pay

## 2021-12-08 DIAGNOSIS — Z79899 Other long term (current) drug therapy: Secondary | ICD-10-CM | POA: Diagnosis not present

## 2021-12-08 DIAGNOSIS — Z5112 Encounter for antineoplastic immunotherapy: Secondary | ICD-10-CM | POA: Diagnosis present

## 2021-12-08 DIAGNOSIS — C9002 Multiple myeloma in relapse: Secondary | ICD-10-CM | POA: Diagnosis not present

## 2021-12-08 DIAGNOSIS — C9000 Multiple myeloma not having achieved remission: Secondary | ICD-10-CM

## 2021-12-08 LAB — OCCULT BLOOD X 1 CARD TO LAB, STOOL
Fecal Occult Bld: NEGATIVE
Fecal Occult Bld: NEGATIVE
Fecal Occult Bld: NEGATIVE

## 2021-12-09 ENCOUNTER — Inpatient Hospital Stay: Payer: Medicare Other | Admitting: Hematology

## 2021-12-09 ENCOUNTER — Inpatient Hospital Stay: Payer: Medicare Other

## 2021-12-09 ENCOUNTER — Inpatient Hospital Stay: Payer: Medicare Other | Attending: Hematology

## 2021-12-09 VITALS — BP 116/47 | HR 64 | Temp 98.4°F | Resp 16 | Wt 133.2 lb

## 2021-12-09 DIAGNOSIS — Z5112 Encounter for antineoplastic immunotherapy: Secondary | ICD-10-CM | POA: Diagnosis not present

## 2021-12-09 DIAGNOSIS — C9000 Multiple myeloma not having achieved remission: Secondary | ICD-10-CM

## 2021-12-09 DIAGNOSIS — D649 Anemia, unspecified: Secondary | ICD-10-CM

## 2021-12-09 LAB — CBC WITH DIFFERENTIAL/PLATELET
Abs Immature Granulocytes: 0.1 10*3/uL — ABNORMAL HIGH (ref 0.00–0.07)
Band Neutrophils: 16 %
Basophils Absolute: 0 10*3/uL (ref 0.0–0.1)
Basophils Relative: 0 %
Eosinophils Absolute: 0 10*3/uL (ref 0.0–0.5)
Eosinophils Relative: 1 %
HCT: 23.8 % — ABNORMAL LOW (ref 36.0–46.0)
Hemoglobin: 7.7 g/dL — ABNORMAL LOW (ref 12.0–15.0)
Lymphocytes Relative: 29 %
Lymphs Abs: 0.6 10*3/uL — ABNORMAL LOW (ref 0.7–4.0)
MCH: 30.2 pg (ref 26.0–34.0)
MCHC: 32.4 g/dL (ref 30.0–36.0)
MCV: 93.3 fL (ref 80.0–100.0)
Metamyelocytes Relative: 3 %
Monocytes Absolute: 0.1 10*3/uL (ref 0.1–1.0)
Monocytes Relative: 3 %
Neutro Abs: 1.3 10*3/uL — ABNORMAL LOW (ref 1.7–7.7)
Neutrophils Relative %: 48 %
Platelets: 56 10*3/uL — ABNORMAL LOW (ref 150–400)
RBC: 2.55 MIL/uL — ABNORMAL LOW (ref 3.87–5.11)
RDW: 19.1 % — ABNORMAL HIGH (ref 11.5–15.5)
WBC: 2.1 10*3/uL — ABNORMAL LOW (ref 4.0–10.5)
nRBC: 0 % (ref 0.0–0.2)

## 2021-12-09 LAB — COMPREHENSIVE METABOLIC PANEL
ALT: 21 U/L (ref 0–44)
AST: 17 U/L (ref 15–41)
Albumin: 3.2 g/dL — ABNORMAL LOW (ref 3.5–5.0)
Alkaline Phosphatase: 79 U/L (ref 38–126)
Anion gap: 7 (ref 5–15)
BUN: 24 mg/dL — ABNORMAL HIGH (ref 8–23)
CO2: 26 mmol/L (ref 22–32)
Calcium: 9.1 mg/dL (ref 8.9–10.3)
Chloride: 102 mmol/L (ref 98–111)
Creatinine, Ser: 0.73 mg/dL (ref 0.44–1.00)
GFR, Estimated: >60 mL/min (ref >60)
Glucose, Bld: 116 mg/dL — ABNORMAL HIGH (ref 70–99)
Potassium: 4.3 mmol/L (ref 3.5–5.1)
Sodium: 135 mmol/L (ref 135–145)
Total Bilirubin: 0.3 mg/dL (ref 0.3–1.2)
Total Protein: 6 g/dL — ABNORMAL LOW (ref 6.5–8.1)

## 2021-12-09 LAB — SAMPLE TO BLOOD BANK

## 2021-12-09 LAB — MAGNESIUM: Magnesium: 2.1 mg/dL (ref 1.7–2.4)

## 2021-12-09 MED ORDER — DARATUMUMAB-HYALURONIDASE-FIHJ 1800-30000 MG-UT/15ML ~~LOC~~ SOLN
1800.0000 mg | Freq: Once | SUBCUTANEOUS | Status: AC
  Administered 2021-12-09: 1800 mg via SUBCUTANEOUS
  Filled 2021-12-09: qty 15

## 2021-12-09 NOTE — Patient Instructions (Signed)
[facility]  Discharge Instructions: Thank you for choosing [facility] to provide your oncology and hematology care.  If you have a lab appointment with the [facility], please come in thru the Main Entrance and check in at the main information desk.  Wear comfortable clothing and clothing appropriate for easy access to any Portacath or PICC line.   We strive to give you quality time with your provider. You may need to reschedule your appointment if you arrive late (15 or more minutes).  Arriving late affects you and other patients whose appointments are after yours.  Also, if you miss three or more appointments without notifying the office, you may be dismissed from the clinic at the provider's discretion.      For prescription refill requests, have your pharmacy contact our office and allow 72 hours for refills to be completed.    Today you received the following chemotherapy and/or immunotherapy agents Daratumumab.  Daratumumab; Hyaluronidase Injection What is this medication? DARATUMUMAB; HYALURONIDASE (dar a toom ue mab; hye al ur ON i dase) treats multiple myeloma, a type of bone marrow cancer. Daratumumab works by blocking a protein that causes cancer cells to grow and multiply. This helps to slow or stop the spread of cancer cells. Hyaluronidase works by increasing the absorption of other medications in the body to help them work better. This medication may also be used treat amyloidosis, a condition that causes the buildup of a protein (amyloid) in your body. It works by reducing the buildup of this protein, which decreases symptoms. It is a combination medication that contains a monoclonal antibody. This medicine may be used for other purposes; ask your health care provider or pharmacist if you have questions. COMMON BRAND NAME(S): DARZALEX FASPRO What should I tell my care team before I take this medication? They need to know if you have any of  these conditions: Heart disease Infection, such as chickenpox, cold sores, herpes, hepatitis B Lung or breathing disease An unusual or allergic reaction to daratumumab, hyaluronidase, other medications, foods, dyes, or preservatives Pregnant or trying to get pregnant Breast-feeding How should I use this medication? This medication is injected under the skin. It is given by your care team in a hospital or clinic setting. Talk to your care team about the use of this medication in children. Special care may be needed. Overdosage: If you think you have taken too much of this medicine contact a poison control center or emergency room at once. NOTE: This medicine is only for you. Do not share this medicine with others. What if I miss a dose? Keep appointments for follow-up doses. It is important not to miss your dose. Call your care team if you are unable to keep an appointment. What may interact with this medication? Interactions have not been studied. This list may not describe all possible interactions. Give your health care provider a list of all the medicines, herbs, non-prescription drugs, or dietary supplements you use. Also tell them if you smoke, drink alcohol, or use illegal drugs. Some items may interact with your medicine. What should I watch for while using this medication? Your condition will be monitored carefully while you are receiving this medication. This medication can cause serious allergic reactions. To reduce your risk, your care team may give you other medication to take before receiving this one. Be sure to follow the directions from your care team. This medication can affect the results of blood tests to match  your blood type. These changes can last for up to 6 months after the final dose. Your care team will do blood tests to match your blood type before you start treatment. Tell all of your care team that you are being treated with this medication before receiving a blood  transfusion. This medication can affect the results of some tests used to determine treatment response; extra tests may be needed to evaluate response. Talk to your care team if you wish to become pregnant or think you are pregnant. This medication can cause serious birth defects if taken during pregnancy and for 3 months after the last dose. A reliable form of contraception is recommended while taking this medication and for 3 months after the last dose. Talk to your care team about effective forms of contraception. Do not breast-feed while taking this medication. What side effects may I notice from receiving this medication? Side effects that you should report to your care team as soon as possible: Allergic reactions--skin rash, itching, hives, swelling of the face, lips, tongue, or throat Heart rhythm changes--fast or irregular heartbeat, dizziness, feeling faint or lightheaded, chest pain, trouble breathing Infection--fever, chills, cough, sore throat, wounds that don't heal, pain or trouble when passing urine, general feeling of discomfort or being unwell Infusion reactions--chest pain, shortness of breath or trouble breathing, feeling faint or lightheaded Sudden eye pain or change in vision such as blurry vision, seeing halos around lights, vision loss Unusual bruising or bleeding Side effects that usually do not require medical attention (report to your care team if they continue or are bothersome): Constipation Diarrhea Fatigue Nausea Pain, tingling, or numbness in the hands or feet Swelling of the ankles, hands, or feet This list may not describe all possible side effects. Call your doctor for medical advice about side effects. You may report side effects to FDA at 1-800-FDA-1088. Where should I keep my medication? This medication is given in a hospital or clinic. It will not be stored at home. NOTE: This sheet is a summary. It may not cover all possible information. If you have  questions about this medicine, talk to your doctor, pharmacist, or health care provider.  2023 Elsevier/Gold Standard (2021-06-17 00:00:00)        To help prevent nausea and vomiting after your treatment, we encourage you to take your nausea medication as directed.  BELOW ARE SYMPTOMS THAT SHOULD BE REPORTED IMMEDIATELY: *FEVER GREATER THAN 100.4 F (38 C) OR HIGHER *CHILLS OR SWEATING *NAUSEA AND VOMITING THAT IS NOT CONTROLLED WITH YOUR NAUSEA MEDICATION *UNUSUAL SHORTNESS OF BREATH *UNUSUAL BRUISING OR BLEEDING *URINARY PROBLEMS (pain or burning when urinating, or frequent urination) *BOWEL PROBLEMS (unusual diarrhea, constipation, pain near the anus) TENDERNESS IN MOUTH AND THROAT WITH OR WITHOUT PRESENCE OF ULCERS (sore throat, sores in mouth, or a toothache) UNUSUAL RASH, SWELLING OR PAIN  UNUSUAL VAGINAL DISCHARGE OR ITCHING   Items with * indicate a potential emergency and should be followed up as soon as possible or go to the Emergency Department if any problems should occur.  Please show the CHEMOTHERAPY ALERT CARD or IMMUNOTHERAPY ALERT CARD at check-in to the Emergency Department and triage nurse.  Should you have questions after your visit or need to cancel or reschedule your appointment, please contact [facility] [phone]  and follow the prompts.  Office hours are 8:00 a.m. to 4:30 p.m. Monday - Friday. Please note that voicemails left after 4:00 p.m. may not be returned until the following business day.  We are closed weekends and major holidays. You have access to a nurse at all times for urgent questions. Please call the main number to the clinic [phone] and follow the prompts.  For any non-urgent questions, you may also contact your provider using MyChart. We now offer e-Visits for anyone 61 and older to request care online for non-urgent symptoms. For details visit [email or web address].   Also download the MyChart app! Go to the app  store, search "MyChart", open the app, select [facility], and log in with your MyChart username and password.  Masks are optional in the cancer centers. If you would like for your care team to wear a mask while they are taking care of you, please let them know. You may have one support person who is at least 80 years old accompany you for your appointments.

## 2021-12-09 NOTE — Progress Notes (Signed)
Patient presents today for chemotherapy injection.  Patient is in satisfactory condition with no new complaints voiced.  Vital signs are stable.  Labs reviewed.  Platelets today are 56 and ANC is 1.3.  MD made aware.  All other labs are within treatment parameters.  Pre-medications were taken at home at 0900 prior to visit.  We will proceed with treatment per MD orders.   Patient tolerated injection well with no complaints voiced.  Patient left ambulatory in stable condition.  Vital signs stable at discharge.  Follow up as scheduled.

## 2021-12-18 ENCOUNTER — Other Ambulatory Visit: Payer: Self-pay

## 2021-12-18 DIAGNOSIS — C9000 Multiple myeloma not having achieved remission: Secondary | ICD-10-CM

## 2021-12-18 MED ORDER — POMALIDOMIDE 2 MG PO CAPS: 2.0000 mg | ORAL_CAPSULE | Freq: Every day | ORAL | 0 refills | Status: DC

## 2021-12-18 NOTE — Telephone Encounter (Signed)
Chart reviewed. Pomalyst refilled per last office note with Dr. Katragadda.  

## 2021-12-22 ENCOUNTER — Other Ambulatory Visit: Payer: Self-pay

## 2021-12-22 MED ORDER — DEXAMETHASONE 4 MG PO TABS: 8.0000 mg | ORAL_TABLET | ORAL | 3 refills | Status: DC

## 2021-12-23 ENCOUNTER — Inpatient Hospital Stay: Payer: Medicare Other | Admitting: Hematology

## 2021-12-23 ENCOUNTER — Inpatient Hospital Stay: Payer: Medicare Other

## 2021-12-23 ENCOUNTER — Other Ambulatory Visit: Payer: Self-pay | Admitting: *Deleted

## 2021-12-23 DIAGNOSIS — D649 Anemia, unspecified: Secondary | ICD-10-CM

## 2021-12-23 DIAGNOSIS — C9000 Multiple myeloma not having achieved remission: Secondary | ICD-10-CM

## 2021-12-23 DIAGNOSIS — Z5112 Encounter for antineoplastic immunotherapy: Secondary | ICD-10-CM | POA: Diagnosis not present

## 2021-12-23 LAB — CBC WITH DIFFERENTIAL/PLATELET
Abs Immature Granulocytes: 0.08 10*3/uL — ABNORMAL HIGH (ref 0.00–0.07)
Basophils Absolute: 0 10*3/uL (ref 0.0–0.1)
Basophils Relative: 0 %
Eosinophils Absolute: 0 10*3/uL (ref 0.0–0.5)
Eosinophils Relative: 1 %
HCT: 16.5 % — ABNORMAL LOW (ref 36.0–46.0)
Hemoglobin: 5.4 g/dL — CL (ref 12.0–15.0)
Immature Granulocytes: 5 %
Lymphocytes Relative: 38 %
Lymphs Abs: 0.7 10*3/uL (ref 0.7–4.0)
MCH: 31.6 pg (ref 26.0–34.0)
MCHC: 32.7 g/dL (ref 30.0–36.0)
MCV: 96.5 fL (ref 80.0–100.0)
Monocytes Absolute: 0.2 10*3/uL (ref 0.1–1.0)
Monocytes Relative: 10 %
Neutro Abs: 0.8 10*3/uL — ABNORMAL LOW (ref 1.7–7.7)
Neutrophils Relative %: 46 %
Platelets: 32 10*3/uL — ABNORMAL LOW (ref 150–400)
RBC: 1.71 MIL/uL — ABNORMAL LOW (ref 3.87–5.11)
RDW: 21.1 % — ABNORMAL HIGH (ref 11.5–15.5)
WBC: 1.8 10*3/uL — ABNORMAL LOW (ref 4.0–10.5)
nRBC: 0 % (ref 0.0–0.2)

## 2021-12-23 LAB — COMPREHENSIVE METABOLIC PANEL
ALT: 35 U/L (ref 0–44)
AST: 25 U/L (ref 15–41)
Albumin: 3.3 g/dL — ABNORMAL LOW (ref 3.5–5.0)
Alkaline Phosphatase: 99 U/L (ref 38–126)
Anion gap: 7 (ref 5–15)
BUN: 28 mg/dL — ABNORMAL HIGH (ref 8–23)
CO2: 26 mmol/L (ref 22–32)
Calcium: 8.8 mg/dL — ABNORMAL LOW (ref 8.9–10.3)
Chloride: 102 mmol/L (ref 98–111)
Creatinine, Ser: 0.72 mg/dL (ref 0.44–1.00)
GFR, Estimated: >60 mL/min (ref >60)
Glucose, Bld: 145 mg/dL — ABNORMAL HIGH (ref 70–99)
Potassium: 4.1 mmol/L (ref 3.5–5.1)
Sodium: 135 mmol/L (ref 135–145)
Total Bilirubin: 0.5 mg/dL (ref 0.3–1.2)
Total Protein: 6.4 g/dL — ABNORMAL LOW (ref 6.5–8.1)

## 2021-12-23 LAB — MAGNESIUM: Magnesium: 2.4 mg/dL (ref 1.7–2.4)

## 2021-12-23 LAB — PREPARE RBC (CROSSMATCH)

## 2021-12-23 LAB — SAMPLE TO BLOOD BANK

## 2021-12-23 MED ORDER — DEXAMETHASONE 4 MG PO TABS: 10.0000 mg | ORAL_TABLET | ORAL | 4 refills | Status: AC

## 2021-12-23 NOTE — Progress Notes (Signed)
Patient here today for treatment.  She feels weak.  She is aware of her labs today and appt times for tomorrow.  She will take her premeds again tomorrow and will take the tylenol and benadryl for the blood tomorrow before her arrival.  She was discharged in wheelchair with her daughter in stable condition.

## 2021-12-23 NOTE — Progress Notes (Signed)
Critical value alert:   WBC 1.8  Hgb 5.4 Hct 16.5 Platelet 32 ANC 0.8  Dr. Delton Coombes aware.  We will hold treatment today.  She will come back tomorrow for 2 units of PRBC and treatment.

## 2021-12-24 ENCOUNTER — Inpatient Hospital Stay: Payer: Medicare Other

## 2021-12-24 VITALS — BP 132/70 | HR 69 | Temp 98.6°F | Resp 18 | Wt 133.6 lb

## 2021-12-24 DIAGNOSIS — Z5112 Encounter for antineoplastic immunotherapy: Secondary | ICD-10-CM | POA: Diagnosis not present

## 2021-12-24 DIAGNOSIS — C9000 Multiple myeloma not having achieved remission: Secondary | ICD-10-CM

## 2021-12-24 LAB — KAPPA/LAMBDA LIGHT CHAINS
Kappa free light chain: 24.2 mg/L — ABNORMAL HIGH (ref 3.3–19.4)
Kappa, lambda light chain ratio: 12.1 — ABNORMAL HIGH (ref 0.26–1.65)
Lambda free light chains: 2 mg/L — ABNORMAL LOW (ref 5.7–26.3)

## 2021-12-24 MED ORDER — DARATUMUMAB-HYALURONIDASE-FIHJ 1800-30000 MG-UT/15ML ~~LOC~~ SOLN
1800.0000 mg | Freq: Once | SUBCUTANEOUS | Status: AC
  Administered 2021-12-24: 1800 mg via SUBCUTANEOUS
  Filled 2021-12-24: qty 15

## 2021-12-24 MED ORDER — HEPARIN SOD (PORK) LOCK FLUSH 100 UNIT/ML IV SOLN: 500.0000 [IU] | Freq: Every day | INTRAVENOUS | Status: DC | PRN

## 2021-12-24 MED ORDER — SODIUM CHLORIDE 0.9% IV SOLUTION
250.0000 mL | Freq: Once | INTRAVENOUS | Status: AC
  Administered 2021-12-24: 250 mL via INTRAVENOUS

## 2021-12-24 MED ORDER — SODIUM CHLORIDE 0.9% FLUSH: 10.0000 mL | INTRAVENOUS | Status: DC | PRN

## 2021-12-24 NOTE — Progress Notes (Signed)
UNMATCHED BLOOD PRODUCT NOTE  Compare the patient ID on the blood tag to the patient ID on the hospital armband and Blood Bank armband. Then confirm the unit number on the blood tag matches the unit number on the blood product.  If a discrepancy is discovered return the product to blood bank immediately. Verified by 2 nurses: Ronne Binning RN Ronnald Nian RN  Blood Product Type: Packed Red Blood Cells  Unit #: (Found on blood product bag, begins with W) [id number]  Product Code #: (Found on blood product bag, begins with E) [id number]   Start Time: 1236  Starting Rate: 120 ml/hr  Rate increase/decreased  (if applicable):      ml/hr 32m/hr  Rate changed time (if applicable): 19810  Stop Time: 1228   All Other Documentation should be documented within the Blood Admin Flowsheet per policy.

## 2021-12-24 NOTE — Patient Instructions (Signed)
[facility]  Discharge Instructions: Thank you for choosing [facility] to provide your oncology and hematology care.  If you have a lab appointment with the [facility], please come in thru the Main Entrance and check in at the main information desk.  Wear comfortable clothing and clothing appropriate for easy access to any Portacath or PICC line.   We strive to give you quality time with your provider. You may need to reschedule your appointment if you arrive late (15 or more minutes).  Arriving late affects you and other patients whose appointments are after yours.  Also, if you miss three or more appointments without notifying the office, you may be dismissed from the clinic at the provider's discretion.      For prescription refill requests, have your pharmacy contact our office and allow 72 hours for refills to be completed.    Today you received the following chemotherapy and/or immunotherapy agents 2 UPRBC, Daratumumab      To help prevent nausea and vomiting after your treatment, we encourage you to take your nausea medication as directed.  BELOW ARE SYMPTOMS THAT SHOULD BE REPORTED IMMEDIATELY: *FEVER GREATER THAN 100.4 F (38 C) OR HIGHER *CHILLS OR SWEATING *NAUSEA AND VOMITING THAT IS NOT CONTROLLED WITH YOUR NAUSEA MEDICATION *UNUSUAL SHORTNESS OF BREATH *UNUSUAL BRUISING OR BLEEDING *URINARY PROBLEMS (pain or burning when urinating, or frequent urination) *BOWEL PROBLEMS (unusual diarrhea, constipation, pain near the anus) TENDERNESS IN MOUTH AND THROAT WITH OR WITHOUT PRESENCE OF ULCERS (sore throat, sores in mouth, or a toothache) UNUSUAL RASH, SWELLING OR PAIN  UNUSUAL VAGINAL DISCHARGE OR ITCHING   Items with * indicate a potential emergency and should be followed up as soon as possible or go to the Emergency Department if any problems should occur.  Please show the CHEMOTHERAPY ALERT CARD or IMMUNOTHERAPY ALERT CARD at check-in to  the Emergency Department and triage nurse.  Should you have questions after your visit or need to cancel or reschedule your appointment, please contact [facility] [phone]  and follow the prompts.  Office hours are 8:00 a.m. to 4:30 p.m. Monday - Friday. Please note that voicemails left after 4:00 p.m. may not be returned until the following business day.  We are closed weekends and major holidays. You have access to a nurse at all times for urgent questions. Please call the main number to the clinic [phone] and follow the prompts.  For any non-urgent questions, you may also contact your provider using MyChart. We now offer e-Visits for anyone 12 and older to request care online for non-urgent symptoms. For details visit [email or web address].   Also download the MyChart app! Go to the app store, search "MyChart", open the app, select [facility], and log in with your MyChart username and password.  Masks are optional in the cancer centers. If you would like for your care team to wear a mask while they are taking care of you, please let them know. You may have one support person who is at least 80 years old accompany you for your appointments.

## 2021-12-24 NOTE — Progress Notes (Signed)
Patient presents today for [facility] and Daratumumab injection per providers order.  Vital signs WNL. Labs 12/23/21, MD reviewed.  Patient took Tylenol, Benadryl, and Claritin at home prior to appointment.  Treatment given today per MD orders.  Stable during infusion without adverse affects.  Vital signs stable.  Daratumumab administration without incident; injection site WNL; see MAR for injection details.  Patient tolerated procedure well and without incident.  No complaints at this time.  Discharge from clinic ambulatory in stable condition.  Alert and oriented X 3.  Follow up with [facility] as scheduled.

## 2021-12-25 LAB — PROTEIN ELECTROPHORESIS, SERUM
A/G Ratio: 1.3 (ref 0.7–1.7)
Albumin ELP: 3.2 g/dL (ref 2.9–4.4)
Alpha-1-Globulin: 0.3 g/dL (ref 0.0–0.4)
Alpha-2-Globulin: 0.8 g/dL (ref 0.4–1.0)
Beta Globulin: 1.3 g/dL (ref 0.7–1.3)
Gamma Globulin: 0.2 g/dL — ABNORMAL LOW (ref 0.4–1.8)
Globulin, Total: 2.5 g/dL (ref 2.2–3.9)
M-Spike, %: 0.4 g/dL — ABNORMAL HIGH
Total Protein ELP: 5.7 g/dL — ABNORMAL LOW (ref 6.0–8.5)

## 2021-12-25 LAB — BPAM RBC
Blood Product Expiration Date: 202311042359
Blood Product Expiration Date: 202311202359
ISSUE DATE / TIME: 202310191225
ISSUE DATE / TIME: 202310191615
Unit Type and Rh: 5100
Unit Type and Rh: 5100

## 2021-12-25 LAB — TYPE AND SCREEN
ABO/RH(D): O POS
Antibody Screen: POSITIVE
Donor AG Type: NEGATIVE
Donor AG Type: NEGATIVE
Unit division: 0
Unit division: 0

## 2021-12-30 ENCOUNTER — Other Ambulatory Visit: Payer: Self-pay

## 2022-01-05 ENCOUNTER — Other Ambulatory Visit: Payer: Self-pay

## 2022-01-06 ENCOUNTER — Other Ambulatory Visit: Payer: Self-pay | Admitting: *Deleted

## 2022-01-06 ENCOUNTER — Inpatient Hospital Stay: Payer: Medicare Other

## 2022-01-06 ENCOUNTER — Inpatient Hospital Stay: Payer: Medicare Other | Attending: Hematology | Admitting: Hematology

## 2022-01-06 DIAGNOSIS — Z5112 Encounter for antineoplastic immunotherapy: Secondary | ICD-10-CM | POA: Diagnosis present

## 2022-01-06 DIAGNOSIS — D649 Anemia, unspecified: Secondary | ICD-10-CM

## 2022-01-06 DIAGNOSIS — C9002 Multiple myeloma in relapse: Secondary | ICD-10-CM | POA: Insufficient documentation

## 2022-01-06 DIAGNOSIS — Z23 Encounter for immunization: Secondary | ICD-10-CM | POA: Diagnosis not present

## 2022-01-06 DIAGNOSIS — C9000 Multiple myeloma not having achieved remission: Secondary | ICD-10-CM

## 2022-01-06 DIAGNOSIS — Z79899 Other long term (current) drug therapy: Secondary | ICD-10-CM | POA: Insufficient documentation

## 2022-01-06 LAB — CBC WITH DIFFERENTIAL/PLATELET
Abs Immature Granulocytes: 1.3 10*3/uL — ABNORMAL HIGH (ref 0.00–0.07)
Basophils Absolute: 0 10*3/uL (ref 0.0–0.1)
Basophils Relative: 1 %
Eosinophils Absolute: 0 10*3/uL (ref 0.0–0.5)
Eosinophils Relative: 0 %
HCT: 22 % — ABNORMAL LOW (ref 36.0–46.0)
Hemoglobin: 7 g/dL — ABNORMAL LOW (ref 12.0–15.0)
Lymphocytes Relative: 30 %
Lymphs Abs: 0.7 10*3/uL (ref 0.7–4.0)
MCH: 30.4 pg (ref 26.0–34.0)
MCHC: 31.8 g/dL (ref 30.0–36.0)
MCV: 95.7 fL (ref 80.0–100.0)
Monocytes Absolute: 0.2 10*3/uL (ref 0.1–1.0)
Monocytes Relative: 8 %
Neutro Abs: 1.4 10*3/uL — ABNORMAL LOW (ref 1.7–7.7)
Neutrophils Relative %: 61 %
Platelets: 34 10*3/uL — ABNORMAL LOW (ref 150–400)
RBC: 2.3 MIL/uL — ABNORMAL LOW (ref 3.87–5.11)
RDW: 18.1 % — ABNORMAL HIGH (ref 11.5–15.5)
Smear Review: DECREASED
WBC: 2.3 10*3/uL — ABNORMAL LOW (ref 4.0–10.5)
nRBC: 0 % (ref 0.0–0.2)

## 2022-01-06 LAB — COMPREHENSIVE METABOLIC PANEL
ALT: 37 U/L (ref 0–44)
AST: 25 U/L (ref 15–41)
Albumin: 3.3 g/dL — ABNORMAL LOW (ref 3.5–5.0)
Alkaline Phosphatase: 92 U/L (ref 38–126)
Anion gap: 9 (ref 5–15)
BUN: 30 mg/dL — ABNORMAL HIGH (ref 8–23)
CO2: 25 mmol/L (ref 22–32)
Calcium: 9.1 mg/dL (ref 8.9–10.3)
Chloride: 101 mmol/L (ref 98–111)
Creatinine, Ser: 0.78 mg/dL (ref 0.44–1.00)
GFR, Estimated: >60 mL/min (ref >60)
Glucose, Bld: 154 mg/dL — ABNORMAL HIGH (ref 70–99)
Potassium: 4.2 mmol/L (ref 3.5–5.1)
Sodium: 135 mmol/L (ref 135–145)
Total Bilirubin: 0.5 mg/dL (ref 0.3–1.2)
Total Protein: 6.4 g/dL — ABNORMAL LOW (ref 6.5–8.1)

## 2022-01-06 LAB — PREPARE RBC (CROSSMATCH)

## 2022-01-06 LAB — SAMPLE TO BLOOD BANK

## 2022-01-06 LAB — MAGNESIUM: Magnesium: 2.3 mg/dL (ref 1.7–2.4)

## 2022-01-06 MED ORDER — DENOSUMAB 120 MG/1.7ML ~~LOC~~ SOLN
120.0000 mg | Freq: Once | SUBCUTANEOUS | Status: AC
  Administered 2022-01-06: 120 mg via SUBCUTANEOUS

## 2022-01-06 MED ORDER — DARATUMUMAB-HYALURONIDASE-FIHJ 1800-30000 MG-UT/15ML ~~LOC~~ SOLN
1800.0000 mg | Freq: Once | SUBCUTANEOUS | Status: AC
  Administered 2022-01-06: 1800 mg via SUBCUTANEOUS
  Filled 2022-01-06: qty 15

## 2022-01-06 MED ORDER — INFLUENZA VAC A&B SA ADJ QUAD 0.5 ML IM PRSY
0.5000 mL | PREFILLED_SYRINGE | Freq: Once | INTRAMUSCULAR | Status: AC
  Administered 2022-01-06: 0.5 mL via INTRAMUSCULAR
  Filled 2022-01-06: qty 0.5

## 2022-01-06 NOTE — Progress Notes (Signed)
Courtney Arnold, Courtney Arnold [address]   CLINIC:  Medical Oncology/Hematology  PCP:  [name], MD [address] / [place] [address] [phone]   REASON FOR VISIT:  Follow-up for IgA kappa plasma cell myeloma  PRIOR THERAPY: Velcade x 9 cycles from 07/04/2019 to 01/10/2020  CURRENT THERAPY: Pomalidomide, daratumumab and dexamethasone  BRIEF ONCOLOGIC HISTORY:  Oncology History  Multiple myeloma not having achieved remission ([place])  06/07/2019 Initial Diagnosis   Multiple myeloma not having achieved remission ([place])   07/04/2019 - 01/10/2020 Chemotherapy   The patient had dexamethasone (DECADRON) 4 MG tablet, 1 of 1 cycle, Start date: 01/10/2020, End date: -- lenalidomide (REVLIMID) 15 MG capsule, 1 of 1 cycle, Start date: 02/26/2020, End date: -- bortezomib SQ (VELCADE) chemo injection 2.25 mg, 1.3 mg/m2 = 2.25 mg, Subcutaneous,  Once, 9 of 10 cycles Administration: 2.25 mg (07/04/2019), 2.25 mg (07/11/2019), 2.25 mg (07/26/2019), 2.25 mg (08/02/2019), 2.25 mg (08/09/2019), 2.25 mg (08/16/2019), 2.25 mg (08/23/2019), 2.25 mg (08/30/2019), 2.25 mg (09/06/2019), 2.25 mg (09/13/2019), 2.25 mg (09/20/2019), 2.25 mg (09/27/2019), 2.25 mg (10/04/2019), 2.25 mg (10/11/2019), 2.25 mg (10/18/2019), 2.25 mg (10/25/2019), 2.25 mg (11/01/2019), 2.25 mg (11/08/2019), 2.25 mg (11/15/2019), 2.25 mg (11/22/2019), 2.25 mg (11/29/2019), 2.25 mg (12/06/2019), 2.25 mg (12/13/2019), 2.25 mg (12/20/2019), 2.25 mg (12/27/2019), 2.25 mg (01/03/2020), 2.25 mg (01/10/2020)  for chemotherapy treatment.    09/15/2021 - 11/04/2021 Chemotherapy   Patient is on Treatment Plan : MYELOMA RELAPSED REFRACTORY Daratumumab SQ + Pomalidomide + Dexamethasone (DaraPd) q28d     09/15/2021 -  Chemotherapy   Patient is on Treatment Plan : MYELOMA RELAPSED REFRACTORY Daratumumab SQ + Pomalidomide + Dexamethasone (DaraPd) q28d       CANCER STAGING:  Cancer Staging  No matching staging information was found for the  patient.  INTERVAL HISTORY:  Courtney Arnold, a 81 y.o. female, seen for follow-up of multiple myeloma.  She had 1 episode of left-sided nosebleeding which was very minimal.  Denied any fevers or infections.  No constipation, diarrhea or nausea reported.  Last cycle of pomalidomide started on 12/29/2021.  She is taking dexamethasone 10 mg on Tuesday and 10 mg on Wednesday.  Denies any lightheadedness, chest pains or shortness of breath.   REVIEW OF SYSTEMS:  Review of Systems  Constitutional:  Negative for appetite change and fatigue.  HENT:   Negative for nosebleeds.   Respiratory:  Negative for cough.   Gastrointestinal:  Negative for blood in stool.  All other systems reviewed and are negative.   PAST MEDICAL/SURGICAL HISTORY:  Past Medical History:  Diagnosis Date   Atrial fibrillation ([place]) 2011   Postop, spontaneous conversion to normal sinus after one hour   Compression fracture 07/24/2009   T12; kyphoplasty   History of echocardiogram 07/2009   EF 65%   Hypertension    Thyroid disease    Tobacco abuse    Past Surgical History:  Procedure Laterality Date   BACK SURGERY     BACK SURGERY  06/06/2015   BREAST EXCISIONAL BIOPSY Left    50 years ago  benign   CHOLECYSTECTOMY N/A 04/25/2015   Procedure: LAPAROSCOPIC CHOLECYSTECTOMY WITH INTRAOPERATIVE CHOLANGIOGRAM;  Surgeon: Mickeal Skinner, MD;  Location: [facility];  Service: General;  Laterality: N/A;   COLONOSCOPY N/A 11/26/2015   Procedure: COLONOSCOPY;  Surgeon: Daneil Dolin, MD;  [facility];  Service: Endoscopy;  Laterality: N/A;  7:30 am   ERCP N/A 04/14/2015   Procedure: ENDOSCOPIC  RETROGRADE CHOLANGIOPANCREATOGRAPHY (ERCP) Biliary Sphincterotomy, 10x7 stent placement Dilated bilary system just not well seen;  Surgeon: Rogene Houston, MD;  Location: [facility];  Service: Endoscopy;  Laterality: N/A;   ERCP N/A 06/12/2015   Procedure: ENDOSCOPIC RETROGRADE CHOLANGIOPANCREATOGRAPHY (ERCP);  Surgeon: Rogene Houston, MD;  [facility];  Service: Endoscopy;  Laterality: N/A;   ESOPHAGOGASTRODUODENOSCOPY N/A 06/12/2015   Procedure: DIAGNOSTIC ESOPHAGOGASTRODUODENOSCOPY (EGD);  Surgeon: Rogene Houston, MD;  [facility];  Service: Endoscopy;  Laterality: N/A;   FEMUR IM NAIL Right 05/11/2019   Procedure: RETROGRADE INTRAMEDULLARY NAIL FEMORAL;  Surgeon: Shona Needles, MD;  Location: [facility];  Service: Orthopedics;  Laterality: Right;   STENT REMOVAL  06/12/2015   Procedure: STENT REMOVAL ;  Surgeon: Rogene Houston, MD;  [facility];  Service: Endoscopy;;    SOCIAL HISTORY:  Social History   Socioeconomic History   Marital status: Widowed    Spouse name: Not on file   Number of children: 2   Years of education: Not on file   Highest education level: Not on file  Occupational History   Occupation: retired  Tobacco Use   Smoking status: Former    Packs/day: 0.15    Years: 20.00    Total pack years: 3.00    Types: Cigarettes    Quit date: 03/08/2013    Years since quitting: 8.8   Smokeless tobacco: Never  Vaping Use   Vaping Use: Never used  Substance and Sexual Activity   Alcohol use: No    Alcohol/week: 0.0 standard drinks of alcohol   Drug use: No   Sexual activity: Not Currently  Other Topics Concern   Not on file  Social History Narrative   Active in gardens and does yard work.   Social Determinants of Health   Financial Resource Strain: Low Risk  (05/08/2019)   Overall Financial Resource Strain (CARDIA)    Difficulty of Paying Living Expenses: Not hard at all  Food Insecurity: No Food Insecurity (12/28/2019)   Hunger Vital Sign    Worried About Running Out of Food in the Last Year: Never true    Ran Out of Food in the Last Year: Never true  Transportation Needs: No Transportation Needs (06/14/2019)   PRAPARE - Hydrologist (Medical): No    Lack of Transportation (Non-Medical): No  Physical Activity: Inactive  (05/08/2019)   Exercise Vital Sign    Days of Exercise per Week: 0 days    Minutes of Exercise per Session: 0 min  Stress: No Stress Concern Present (05/08/2019)   [place]    Feeling of Stress : Not at all  Social Connections: Moderately Isolated (02/06/2020)   Social Connection and Isolation Panel [NHANES]    Frequency of Communication with Friends and Family: More than three times a week    Frequency of Social Gatherings with Friends and Family: More than three times a week    Attends Religious Services: More than 4 times per year    Active Member of [employer] or Organizations: No    Attends Archivist Meetings: Never    Marital Status: Widowed  Intimate Partner Violence: Not At Risk (05/08/2019)   Humiliation, Afraid, Rape, and Kick questionnaire    Fear of Current or Ex-Partner: No    Emotionally Abused: No    Physically Abused: No    Sexually Abused: No    FAMILY  HISTORY:  Family History  Problem Relation Age of Onset   Heart attack Father    Stroke Father    Breast cancer Sister    Thyroid disease Neg Hx    Colon cancer Neg Hx     CURRENT MEDICATIONS:  Current Outpatient Medications  Medication Sig Dispense Refill   acyclovir (ZOVIRAX) 400 MG tablet TAKE (1) TABLET BY MOUTH TWICE DAILY. (Patient taking differently: Take 400 mg by mouth 2 (two) times daily.) 60 tablet 6   CALCIUM PO Take 1 capsule by mouth daily.     carboxymethylcellulose (REFRESH PLUS) 0.5 % SOLN Place 1 drop into both eyes in the morning, at noon, in the evening, and at bedtime.     carboxymethylcellulose (REFRESH PLUS) 0.5 % SOLN Apply to eye.     Cholecalciferol (VITAMIN D) 125 MCG (5000 UT) CAPS Take 5,000 Units by mouth daily. 90 capsule 1   Cyanocobalamin (B-12 PO) Take 1 capsule by mouth daily.     dexamethasone (DECADRON) 4 MG tablet Take 2.5 tablets (10 mg total) by mouth 2 (two) times a week. 20 tablet 4   Ferrous Sulfate  (IRON PO) Take 1 capsule by mouth daily.     fluconazole (DIFLUCAN) 100 MG tablet      levothyroxine (SYNTHROID) 137 MCG tablet Take 1 tablet (137 mcg total) by mouth daily before breakfast. 90 tablet 3   montelukast (SINGULAIR) 10 MG tablet Take one tablet by mouth 1 hour prior to Darzalex injection appointments 30 tablet 2   Multiple Vitamin (MULTIVITAMIN WITH MINERALS) TABS tablet Take 1 tablet by mouth daily.     ondansetron (ZOFRAN) 8 MG tablet Take 1 tablet (8 mg total) by mouth every 8 (eight) hours as needed for nausea or vomiting. 60 tablet 0   pomalidomide (POMALYST) 2 MG capsule Take 1 capsule (2 mg total) by mouth daily. Take for 21 days, then hold for 7 days. Repeat every 28 days. 21 capsule 0   potassium chloride SA (KLOR-CON M) 20 MEQ tablet Take 1 tablet (20 mEq total) by mouth as needed. Take Potassium 40 meq by mouth then wait three hours and take 20 meq by mouth and wait three hours and take 40 meq by mouth for a total of Potassium 100 meq. 5 tablet 1   SSD 1 % cream Apply 1 Application topically daily.     No current facility-administered medications for this visit.   Facility-Administered Medications Ordered in Other Visits  Medication Dose Route Frequency Provider Last Rate Last Admin   0.9 %  sodium chloride infusion (Manually program via Guardrails IV Fluids)  250 mL Intravenous Once Derek Jack, MD       0.9 %  sodium chloride infusion   Intravenous Continuous Derek Jack, MD   Stopped at 09/15/21 1454    ALLERGIES:  No Known Allergies  PHYSICAL EXAM:  Performance status (ECOG): 1 - Symptomatic but completely ambulatory  There were no vitals filed for this visit.  Wt Readings from Last 3 Encounters:  12/24/21 133 lb 9.5 oz (60.6 kg)  12/23/21 133 lb 9.6 oz (60.6 kg)  12/09/21 133 lb 2.5 oz (60.4 kg)   Physical Exam Vitals reviewed.  Constitutional:      Appearance: Normal appearance.  Cardiovascular:     Rate and Rhythm: Normal rate and  regular rhythm.     Pulses: Normal pulses.     Heart sounds: Normal heart sounds.  Pulmonary:     Effort: Pulmonary effort is normal.  Breath sounds: Normal breath sounds.  Neurological:     General: No focal deficit present.     Mental Status: She is alert and oriented to person, place, and time.  Psychiatric:        Mood and Affect: Mood normal.        Behavior: Behavior normal.   Blood in the back of the throat.  LABORATORY DATA:  I have reviewed the labs as listed.     Latest Ref Rng & Units 12/23/2021   11:44 AM 12/09/2021    9:43 AM 12/02/2021    9:28 AM  CBC  WBC 4.0 - 10.5 K/uL 1.8  2.1  2.3   Hemoglobin 12.0 - 15.0 g/dL 5.4  7.7  8.9   Hematocrit 36.0 - 46.0 % 16.5  23.8  28.2   Platelets 150 - 400 K/uL 32  56  DCLMP       Latest Ref Rng & Units 12/23/2021   11:44 AM 12/09/2021    9:43 AM 12/02/2021    9:28 AM  CMP  Glucose 70 - 99 mg/dL 145  116  104   BUN 8 - 23 mg/dL _0 Creatinine 0.44 - 1.00 mg/dL 0.72  0.73  0.69   Sodium 135 - 145 mmol/L 135  135  135   Potassium 3.5 - 5.1 mmol/L 4.1  4.3  4.0   Chloride 98 - 111 mmol/L 102  102  102   CO2 22 - 32 mmol/L _1 Calcium 8.9 - 10.3 mg/dL 8.8  9.1  8.8   Total Protein 6.5 - 8.1 g/dL 6.4  6.0  6.4   Total Bilirubin 0.3 - 1.2 mg/dL 0.5  0.3  0.6   Alkaline Phos 38 - 126 U/L 99  79  82   AST 15 - 41 U/L _2 ALT 0 - 44 U/L 35  21  20     DIAGNOSTIC IMAGING:  I have independently reviewed the scans and discussed with the patient. No results found.   ASSESSMENT:  1.  IgA kappa plasma cell myeloma: -IgA kappa plasma cell myeloma diagnosed on right sacral bone biopsy on 05/17/2019.  SPEP did not show M spike.  Immunofixation shows IgA kappa.  Kappa light chains 42.8, lambda light chains at 9.9, ratio 4.32.  Beta-2 microglobulin 3.7. -PET scan on 06/04/2019 showed expansile lytic lesion involving the right sacral wing.  Lytic lesion involving T10 vertebral body.  Left seventh rib  lesion.  Increased uptake in a pathological fracture involving distal aspect of the right femur. 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 -RVD started on 06/26/2019.  Revlimid 15 mg 2 weeks on/1 week off. -Velcade discontinued on 01/10/2020. -She is taking Revlimid 15 mg 2 weeks on 1 week off, with dexamethasone 20 mg on weeks of Revlimid and 10 mg while she is off Revlimid.  Revlimid dose further reduced to 10 mg 3 weeks on/1 week off.  She has developed progressive anemia. - BMBX (08/07/2021): 38% plasma cells in the marrow.  Cytogenetics was normal.  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 - DPD regimen started on, single agent Darzalex and dexamethasone started on 09/15/2021.  Daratumumab held after first treatment due to pneumonia. - Pomalidomide 2 mg 3 weeks on/1 week off and dexamethasone 20 mg weekly started on 10/06/2021.  Daratumumab  added on 10/21/2021.   PLAN:  1.  Relapsed IgA kappa plasma cell myeloma: - She is tolerating pomalidomide 2 mg 3 weeks on/1 week off with dexamethasone 10 mg on Tuesday and Wednesday very well. - Reviewed myeloma labs from 12/23/2021.  M spike has decreased to 0.4 g.  Kappa light chains improved to 24.  Ratio is 12.1. - Reviewed labs today which showed platelet count 34 and hemoglobin 7 and white count 2.3 with ANC of 1.4.  Creatinine is normal. - Proceed with Darzalex every 2 weeks, cycle 4-day 1.  Continue pomalidomide at the current dose. - RTC 4 weeks for follow-up. - We will plan to increase pomalidomide to 3 mg 3 weeks on/1 week off at next visit.   2.  Hypokalemia: - Continue potassium daily.  Potassium today is normal.   3.  Normocytic anemia: - Bone marrow infiltration and myelosuppression.  Hemoglobin today is 7.0.  We will arrange for 1 unit transfusion.   4.  ID prophylaxis: - Continue acyclovir twice daily.  Continue to hold aspirin due to thrombocytopenia.   5.   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  Calcium is normal.   Orders placed this encounter:  No orders of the defined types were placed in this encounter.    Derek Jack, MD [place] [phone]

## 2022-01-06 NOTE — Progress Notes (Signed)
Labs reviewed at MD appt. Today. Ok to treat with ANC 1.4 today. Will schedule for one unit of blood per MD for tomorrow. Proceed as planned.   Patient took her pre-meds this am at home.  Treatment given per orders. Flu shot given today per pt request. Patient tolerated it well without problems. Vitals stable and discharged home from clinic ambulatory. Follow up as scheduled.

## 2022-01-06 NOTE — Patient Instructions (Signed)
[facility]  Discharge Instructions: Thank you for choosing [facility] to provide your oncology and hematology care.  If you have a lab appointment with the [facility], please come in thru the Main Entrance and check in at the main information desk.  Wear comfortable clothing and clothing appropriate for easy access to any Portacath or PICC line.   We strive to give you quality time with your provider. You may need to reschedule your appointment if you arrive late (15 or more minutes).  Arriving late affects you and other patients whose appointments are after yours.  Also, if you miss three or more appointments without notifying the office, you may be dismissed from the clinic at the provider's discretion.      For prescription refill requests, have your pharmacy contact our office and allow 72 hours for refills to be completed.    Today you received the following chemotherapy and/or immunotherapy agents Darzalex injection   To help prevent nausea and vomiting after your treatment, we encourage you to take your nausea medication as directed.  BELOW ARE SYMPTOMS THAT SHOULD BE REPORTED IMMEDIATELY: *FEVER GREATER THAN 100.4 F (38 C) OR HIGHER *CHILLS OR SWEATING *NAUSEA AND VOMITING THAT IS NOT CONTROLLED WITH YOUR NAUSEA MEDICATION *UNUSUAL SHORTNESS OF BREATH *UNUSUAL BRUISING OR BLEEDING *URINARY PROBLEMS (pain or burning when urinating, or frequent urination) *BOWEL PROBLEMS (unusual diarrhea, constipation, pain near the anus) TENDERNESS IN MOUTH AND THROAT WITH OR WITHOUT PRESENCE OF ULCERS (sore throat, sores in mouth, or a toothache) UNUSUAL RASH, SWELLING OR PAIN  UNUSUAL VAGINAL DISCHARGE OR ITCHING   Items with * indicate a potential emergency and should be followed up as soon as possible or go to the Emergency Department if any problems should occur.  Please show the CHEMOTHERAPY ALERT CARD or IMMUNOTHERAPY ALERT CARD at check-in to the  Emergency Department and triage nurse.  Should you have questions after your visit or need to cancel or reschedule your appointment, please contact [facility] [phone]  and follow the prompts.  Office hours are 8:00 a.m. to 4:30 p.m. Monday - Friday. Please note that voicemails left after 4:00 p.m. may not be returned until the following business day.  We are closed weekends and major holidays. You have access to a nurse at all times for urgent questions. Please call the main number to the clinic [phone] and follow the prompts.  For any non-urgent questions, you may also contact your provider using MyChart. We now offer e-Visits for anyone 76 and older to request care online for non-urgent symptoms. For details visit [email or web address].   Also download the MyChart app! Go to the app store, search "MyChart", open the app, select [facility], and log in with your MyChart username and password.  Masks are optional in the cancer centers. If you would like for your care team to wear a mask while they are taking care of you, please let them know. You may have one support person who is at least 81 years old accompany you for your appointments.

## 2022-01-06 NOTE — Progress Notes (Signed)
Patient is taking Pomalyst as prescribed.  She has not missed any doses and reports no side effects at this time.    Patient has been examined by Dr. Delton Coombes, and vital signs and labs have been reviewed. ANC, Creatinine, LFTs, hemoglobin, and platelets are within treatment parameters per M.D. - pt may proceed with treatment.  Primary RN and pharmacy notified.

## 2022-01-06 NOTE — Patient Instructions (Addendum)
[place] at [facility] Discharge Instructions   You were seen and examined today by Dr. Delton Coombes.  He reviewed the results of your lab work which are stable. Your m-spike has improved to 0.4.  We will proceed with your treatment today.  You will receive a flu shot today.  We will give you blood 1 unit tomorrow.   Return as scheduled.    Thank you for choosing [facility] at [facility] to provide your oncology and hematology care.  To afford each patient quality time with our provider, please arrive at least 15 minutes before your scheduled appointment time.   If you have a lab appointment with the [facility] please come in thru the Main Entrance and check in at the main information desk.  You need to re-schedule your appointment should you arrive 10 or more minutes late.  We strive to give you quality time with our providers, and arriving late affects you and other patients whose appointments are after yours.  Also, if you no show three or more times for appointments you may be dismissed from the clinic at the providers discretion.     Again, thank you for choosing [facility].  Our hope is that these requests will decrease the amount of time that you wait before being seen by our physicians.       _____________________________________________________________  Should you have questions after your visit to [facility], please contact our office at [phone] and follow the prompts.  Our office hours are 8:00 a.m. and 4:30 p.m. Monday - Friday.  Please note that voicemails left after 4:00 p.m. may not be returned until the following business day.  We are closed weekends and major holidays.  You do have access to a nurse 24-7, just call the main number to the clinic [phone] and do not press any options, hold on the line and a nurse will answer the phone.    For prescription refill requests, have your  pharmacy contact our office and allow 72 hours.    Due to Covid, you will need to wear a mask upon entering the hospital. If you do not have a mask, a mask will be given to you at the Main Entrance upon arrival. For doctor visits, patients may have 1 support person age 21 or older with them. For treatment visits, patients can not have anyone with them due to social distancing guidelines and our immunocompromised population.

## 2022-01-07 ENCOUNTER — Inpatient Hospital Stay: Payer: Medicare Other

## 2022-01-07 DIAGNOSIS — C9000 Multiple myeloma not having achieved remission: Secondary | ICD-10-CM

## 2022-01-07 DIAGNOSIS — Z5112 Encounter for antineoplastic immunotherapy: Secondary | ICD-10-CM | POA: Diagnosis not present

## 2022-01-07 DIAGNOSIS — D649 Anemia, unspecified: Secondary | ICD-10-CM

## 2022-01-07 MED ORDER — SODIUM CHLORIDE 0.9% FLUSH: 10.0000 mL | INTRAVENOUS | Status: DC | PRN

## 2022-01-07 MED ORDER — SODIUM CHLORIDE 0.9% IV SOLUTION
250.0000 mL | Freq: Once | INTRAVENOUS | Status: AC
  Administered 2022-01-07: 250 mL via INTRAVENOUS

## 2022-01-07 NOTE — Progress Notes (Signed)
Patient tolerated Blood transfusion with no complaints voiced. Peripheral IV site clean and dry with good blood return noted before and after infusion. Band aid applied. VSS with discharge and left in satisfactory condition with no s/s of distress noted.

## 2022-01-07 NOTE — Patient Instructions (Signed)
[facility]  Discharge Instructions: Thank you for choosing [facility] to provide your oncology and hematology care.  If you have a lab appointment with the Cancer Center, please come in thru the Main Entrance and check in at the main information desk.  Wear comfortable clothing and clothing appropriate for easy access to any Portacath or PICC line.   We strive to give you quality time with your provider. You may need to reschedule your appointment if you arrive late (15 or more minutes).  Arriving late affects you and other patients whose appointments are after yours.  Also, if you miss three or more appointments without notifying the office, you may be dismissed from the clinic at the provider's discretion.      For prescription refill requests, have your pharmacy contact our office and allow 72 hours for refills to be completed.    Today you received the following 1 unit of PRBCs, return as scheduled.   To help prevent nausea and vomiting after your treatment, we encourage you to take your nausea medication as directed.  BELOW ARE SYMPTOMS THAT SHOULD BE REPORTED IMMEDIATELY: *FEVER GREATER THAN 100.4 F (38 C) OR HIGHER *CHILLS OR SWEATING *NAUSEA AND VOMITING THAT IS NOT CONTROLLED WITH YOUR NAUSEA MEDICATION *UNUSUAL SHORTNESS OF BREATH *UNUSUAL BRUISING OR BLEEDING *URINARY PROBLEMS (pain or burning when urinating, or frequent urination) *BOWEL PROBLEMS (unusual diarrhea, constipation, pain near the anus) TENDERNESS IN MOUTH AND THROAT WITH OR WITHOUT PRESENCE OF ULCERS (sore throat, sores in mouth, or a toothache) UNUSUAL RASH, SWELLING OR PAIN  UNUSUAL VAGINAL DISCHARGE OR ITCHING   Items with * indicate a potential emergency and should be followed up as soon as possible or go to the Emergency Department if any problems should occur.  Please show the CHEMOTHERAPY ALERT CARD or IMMUNOTHERAPY ALERT CARD at check-in to the Emergency Department  and triage nurse.  Should you have questions after your visit or need to cancel or reschedule your appointment, please contact [facility] [phone]  and follow the prompts.  Office hours are 8:00 a.m. to 4:30 p.m. Monday - Friday. Please note that voicemails left after 4:00 p.m. may not be returned until the following business day.  We are closed weekends and major holidays. You have access to a nurse at all times for urgent questions. Please call the main number to the clinic [phone] and follow the prompts.  For any non-urgent questions, you may also contact your provider using MyChart. We now offer e-Visits for anyone 18 and older to request care online for non-urgent symptoms. For details visit [email or web address].   Also download the MyChart app! Go to the app store, search "MyChart", open the app, select [facility], and log in with your MyChart username and password.  Masks are optional in the cancer centers. If you would like for your care team to wear a mask while they are taking care of you, please let them know. You may have one support person who is at least 80 years old accompany you for your appointments.  

## 2022-01-08 LAB — TYPE AND SCREEN
ABO/RH(D): O POS
Antibody Screen: POSITIVE
Donor AG Type: NEGATIVE
Unit division: 0

## 2022-01-08 LAB — BPAM RBC
Blood Product Expiration Date: 202312052359
ISSUE DATE / TIME: 202311021115
Unit Type and Rh: 5100

## 2022-01-09 ENCOUNTER — Other Ambulatory Visit: Payer: Self-pay | Admitting: Hematology

## 2022-01-09 DIAGNOSIS — C9000 Multiple myeloma not having achieved remission: Secondary | ICD-10-CM

## 2022-01-12 ENCOUNTER — Other Ambulatory Visit: Payer: Self-pay

## 2022-01-13 ENCOUNTER — Other Ambulatory Visit: Payer: Self-pay

## 2022-01-13 ENCOUNTER — Encounter (HOSPITAL_COMMUNITY): Payer: Self-pay

## 2022-01-13 ENCOUNTER — Observation Stay (HOSPITAL_COMMUNITY)
Admission: EM | Admit: 2022-01-13 | Discharge: 2022-01-15 | Disposition: A | Discharge status: Home / Self Care | Payer: Medicare Other | Attending: Family Medicine | Admitting: Family Medicine

## 2022-01-13 DIAGNOSIS — E039 Hypothyroidism, unspecified: Secondary | ICD-10-CM | POA: Diagnosis not present

## 2022-01-13 DIAGNOSIS — I4891 Unspecified atrial fibrillation: Secondary | ICD-10-CM | POA: Diagnosis not present

## 2022-01-13 DIAGNOSIS — D61818 Other pancytopenia: Secondary | ICD-10-CM | POA: Insufficient documentation

## 2022-01-13 DIAGNOSIS — R040 Epistaxis: Secondary | ICD-10-CM | POA: Diagnosis not present

## 2022-01-13 DIAGNOSIS — Z66 Do not resuscitate: Secondary | ICD-10-CM | POA: Insufficient documentation

## 2022-01-13 DIAGNOSIS — C9002 Multiple myeloma in relapse: Secondary | ICD-10-CM | POA: Diagnosis not present

## 2022-01-13 DIAGNOSIS — Z7901 Long term (current) use of anticoagulants: Secondary | ICD-10-CM | POA: Insufficient documentation

## 2022-01-13 DIAGNOSIS — N179 Acute kidney failure, unspecified: Secondary | ICD-10-CM | POA: Insufficient documentation

## 2022-01-13 DIAGNOSIS — C9000 Multiple myeloma not having achieved remission: Secondary | ICD-10-CM

## 2022-01-13 DIAGNOSIS — I10 Essential (primary) hypertension: Secondary | ICD-10-CM | POA: Diagnosis not present

## 2022-01-13 DIAGNOSIS — Z79899 Other long term (current) drug therapy: Secondary | ICD-10-CM | POA: Insufficient documentation

## 2022-01-13 DIAGNOSIS — D649 Anemia, unspecified: Secondary | ICD-10-CM | POA: Diagnosis not present

## 2022-01-13 DIAGNOSIS — Z87891 Personal history of nicotine dependence: Secondary | ICD-10-CM | POA: Diagnosis not present

## 2022-01-13 DIAGNOSIS — D62 Acute posthemorrhagic anemia: Principal | ICD-10-CM | POA: Insufficient documentation

## 2022-01-13 DIAGNOSIS — D696 Thrombocytopenia, unspecified: Secondary | ICD-10-CM | POA: Insufficient documentation

## 2022-01-13 LAB — CBC WITH DIFFERENTIAL/PLATELET
Abs Immature Granulocytes: 0 10*3/uL (ref 0.00–0.07)
Band Neutrophils: 1 %
Basophils Absolute: 0 10*3/uL (ref 0.0–0.1)
Basophils Relative: 0 %
Eosinophils Absolute: 0 10*3/uL (ref 0.0–0.5)
Eosinophils Relative: 0 %
HCT: 14.8 % — ABNORMAL LOW (ref 36.0–46.0)
Hemoglobin: 4.7 g/dL — CL (ref 12.0–15.0)
Lymphocytes Relative: 53 %
Lymphs Abs: 0.8 10*3/uL (ref 0.7–4.0)
MCH: 30.5 pg (ref 26.0–34.0)
MCHC: 31.8 g/dL (ref 30.0–36.0)
MCV: 96.1 fL (ref 80.0–100.0)
Metamyelocytes Relative: 2 %
Monocytes Absolute: 0 10*3/uL — ABNORMAL LOW (ref 0.1–1.0)
Monocytes Relative: 2 %
Neutro Abs: 0.6 10*3/uL — ABNORMAL LOW (ref 1.7–7.7)
Neutrophils Relative %: 42 %
Platelets: 20 10*3/uL — CL (ref 150–400)
RBC: 1.54 MIL/uL — ABNORMAL LOW (ref 3.87–5.11)
RDW: 17.2 % — ABNORMAL HIGH (ref 11.5–15.5)
WBC: 1.5 10*3/uL — ABNORMAL LOW (ref 4.0–10.5)
nRBC: 0 % (ref 0.0–0.2)

## 2022-01-13 LAB — COMPREHENSIVE METABOLIC PANEL
ALT: 26 U/L (ref 0–44)
AST: 23 U/L (ref 15–41)
Albumin: 3.1 g/dL — ABNORMAL LOW (ref 3.5–5.0)
Alkaline Phosphatase: 95 U/L (ref 38–126)
Anion gap: 12 (ref 5–15)
BUN: 51 mg/dL — ABNORMAL HIGH (ref 8–23)
CO2: 23 mmol/L (ref 22–32)
Calcium: 7.7 mg/dL — ABNORMAL LOW (ref 8.9–10.3)
Chloride: 99 mmol/L (ref 98–111)
Creatinine, Ser: 1.6 mg/dL — ABNORMAL HIGH (ref 0.44–1.00)
GFR, Estimated: 32 mL/min — ABNORMAL LOW (ref >60)
Glucose, Bld: 208 mg/dL — ABNORMAL HIGH (ref 70–99)
Potassium: 4.4 mmol/L (ref 3.5–5.1)
Sodium: 134 mmol/L — ABNORMAL LOW (ref 135–145)
Total Bilirubin: 0.6 mg/dL (ref 0.3–1.2)
Total Protein: 5.9 g/dL — ABNORMAL LOW (ref 6.5–8.1)

## 2022-01-13 LAB — PREPARE RBC (CROSSMATCH)

## 2022-01-13 MED ORDER — SODIUM CHLORIDE 0.9% IV SOLUTION: Freq: Once | INTRAVENOUS | Status: AC

## 2022-01-13 MED ORDER — OXYMETAZOLINE HCL 0.05 % NA SOLN
1.0000 | Freq: Once | NASAL | Status: AC
  Administered 2022-01-13: 1 via NASAL
  Filled 2022-01-13: qty 30

## 2022-01-13 MED ORDER — POMALIDOMIDE 2 MG PO CAPS: 2.0000 mg | ORAL_CAPSULE | Freq: Every day | ORAL | 0 refills | Status: DC

## 2022-01-13 MED ORDER — SODIUM CHLORIDE 0.9% IV SOLUTION: Freq: Once | INTRAVENOUS | Status: DC

## 2022-01-13 MED ORDER — ONDANSETRON HCL 4 MG/2ML IJ SOLN
4.0000 mg | Freq: Four times a day (QID) | INTRAMUSCULAR | Status: DC | PRN
  Administered 2022-01-13: 4 mg via INTRAVENOUS
  Filled 2022-01-13: qty 2

## 2022-01-13 NOTE — Assessment & Plan Note (Signed)
Hold chemo for now. May need heme/onc consult to help with transfusion(PRBC +/- platelet) goals.

## 2022-01-13 NOTE — ED Notes (Signed)
Emergency unit of blood completed. VSS, NAD noted at this time

## 2022-01-13 NOTE — Subjective & Objective (Signed)
CC: epistaxis HPI: 81 year old female with a history of uncontrolled multiple myeloma, chronic pancytopenia, history of atrial fibrillation, DNR/DNI presents to the ER with a 3 to 4-day history of epistaxis.  Patient has a history of epistaxis.  Patient is not supposed to blow her nose forcefully or pick at her nose.  She has done both in the last several days.  She blew her nose forcefully 3 days ago and a large amount of blood came out of her nose.  Family had difficulty controlling her bloody nose and brought her to the [facility], ER.  Rhino Rocket was placed.  Patient given an emergency unit of packed red blood cells due to hemoglobin of 4.7.  Patient receives nearly monthly blood transfusions.  Dr. Byers([facility]) contacted. Pt is followed by [name] with [facility].  Patient denies any nausea, vomiting, diarrhea.  No chest pain.  No shortness of breath.  Patient is currently taking p.o. pomalidomide.

## 2022-01-13 NOTE — Assessment & Plan Note (Signed)
Chronic. Has been as low at 19K in the past.

## 2022-01-13 NOTE — Assessment & Plan Note (Signed)
Currently has rhinorocket in left nare that was placed by EDP at [facility]. Apparently ENT was consulted over the phone. Will need outpatient follow up with ENT. If bleeds more, may need posterior pack vs Neuro IR consult for embolization.

## 2022-01-13 NOTE — ED Notes (Signed)
Rhino rocket to left nare.

## 2022-01-13 NOTE — H&P (Signed)
History and Physical    Courtney Arnold TDV:[id number] DOB: Apr 03, 1940 DOA: 01/13/2022  DOS: the patient was seen and examined on 01/13/2022  PCP: [name], MD   Patient coming from: Home  I have personally briefly reviewed patient's old medical records in [place]  CC: epistaxis HPI: 81 year old female with a history of uncontrolled multiple myeloma, chronic pancytopenia, history of atrial fibrillation, DNR/DNI presents to the ER with a 3 to 4-day history of epistaxis.  Patient has a history of epistaxis.  Patient is not supposed to blow her nose forcefully or pick at her nose.  She has done both in the last several days.  She blew her nose forcefully 3 days ago and a large amount of blood came out of her nose.  Family had difficulty controlling her bloody nose and brought her to the [facility], ER.  Rhino Rocket was placed.  Patient given an emergency unit of packed red blood cells due to hemoglobin of 4.7.  Patient receives nearly monthly blood transfusions.  Dr. Byers([facility]) contacted. Pt is followed by [name] with [facility].  Patient denies any nausea, vomiting, diarrhea.  No chest pain.  No shortness of breath.  Patient is currently taking p.o. pomalidomide.    ED Course: HgB 4.7. rhinorocket placed in left nare. 1 unit emergency O Negative blood transfused at [facility]  Review of Systems:  Review of Systems  Constitutional: Negative.   HENT:  Positive for nosebleeds.   Eyes: Negative.   Respiratory: Negative.    Cardiovascular: Negative.   Gastrointestinal: Negative.   Genitourinary: Negative.   Musculoskeletal: Negative.   Skin: Negative.   Neurological: Negative.   Endo/Heme/Allergies:  Bruises/bleeds easily.  Psychiatric/Behavioral: Negative.    All other systems reviewed and are negative.   Past Medical History:  Diagnosis Date   Atrial fibrillation ([place]) 2011   Postop, spontaneous conversion to normal sinus after one hour   CAP (community  acquired pneumonia) 09/23/2021   Cholecystitis, acute 04/25/2015   Compression fracture 07/24/2009   T12; kyphoplasty   History of echocardiogram 07/2009   EF 65%   Hypertension    Thyroid disease    Tobacco abuse     Past Surgical History:  Procedure Laterality Date   BACK SURGERY     BACK SURGERY  06/06/2015   BREAST EXCISIONAL BIOPSY Left    50 years ago  benign   CHOLECYSTECTOMY N/A 04/25/2015   Procedure: LAPAROSCOPIC CHOLECYSTECTOMY WITH INTRAOPERATIVE CHOLANGIOGRAM;  Surgeon: Mickeal Skinner, MD;  Location: [facility];  Service: General;  Laterality: N/A;   COLONOSCOPY N/A 11/26/2015   Procedure: COLONOSCOPY;  Surgeon: Daneil Dolin, MD;  Location: [facility];  Service: Endoscopy;  Laterality: N/A;  7:30 am   ERCP N/A 04/14/2015   Procedure: ENDOSCOPIC RETROGRADE CHOLANGIOPANCREATOGRAPHY (ERCP) Biliary Sphincterotomy, 10x7 stent placement Dilated bilary system just not well seen;  Surgeon: Rogene Houston, MD;  Location: [facility];  Service: Endoscopy;  Laterality: N/A;   ERCP N/A 06/12/2015   Procedure: ENDOSCOPIC RETROGRADE CHOLANGIOPANCREATOGRAPHY (ERCP);  Surgeon: Rogene Houston, MD;  Location: [facility];  Service: Endoscopy;  Laterality: N/A;   ESOPHAGOGASTRODUODENOSCOPY N/A 06/12/2015   Procedure: DIAGNOSTIC ESOPHAGOGASTRODUODENOSCOPY (EGD);  Surgeon: Rogene Houston, MD;  Location: [facility];  Service: Endoscopy;  Laterality: N/A;   FEMUR IM NAIL Right 05/11/2019   Procedure: RETROGRADE INTRAMEDULLARY NAIL FEMORAL;  Surgeon: Shona Needles, MD;  Location: [place];  Service: Orthopedics;  Laterality: Right;   STENT  REMOVAL  06/12/2015   Procedure: STENT REMOVAL ;  Surgeon: Rogene Houston, MD;  Location: [facility];  Service: Endoscopy;;     reports that she quit smoking about 8 years ago. Her smoking use included cigarettes. She has a 3.00 pack-year smoking history. She has never used smokeless tobacco. She reports that she does not drink alcohol and does not use  drugs.  No Known Allergies  Family History  Problem Relation Age of Onset   Heart attack Father    Stroke Father    Breast cancer Sister    Thyroid disease Neg Hx    Colon cancer Neg Hx     Prior to Admission medications   Medication Sig Start Date End Date Taking? Authorizing Provider  acyclovir (ZOVIRAX) 400 MG tablet TAKE (1) TABLET BY MOUTH TWICE DAILY. 01/11/22   Derek Jack, MD  CALCIUM PO Take 1 capsule by mouth daily.    [provider]  carboxymethylcellulose (REFRESH PLUS) 0.5 % SOLN Place 1 drop into both eyes in the morning, at noon, in the evening, and at bedtime.    [provider]  carboxymethylcellulose (REFRESH PLUS) 0.5 % SOLN Apply to eye.    [provider]  Cholecalciferol (VITAMIN D) 125 MCG (5000 UT) CAPS Take 5,000 Units by mouth daily. 05/14/19   Corinne Ports, PA-C  Cyanocobalamin (B-12 PO) Take 1 capsule by mouth daily.    [provider]  dexamethasone (DECADRON) 4 MG tablet Take 2.5 tablets (10 mg total) by mouth 2 (two) times a week. 12/24/21   Derek Jack, MD  Ferrous Sulfate (IRON PO) Take 1 capsule by mouth daily.    [provider]  fluconazole (DIFLUCAN) 100 MG tablet  10/02/21   [provider]  levothyroxine (SYNTHROID) 137 MCG tablet Take 1 tablet (137 mcg total) by mouth daily before breakfast. 02/17/21   Renato Shin, MD  montelukast (SINGULAIR) 10 MG tablet Take one tablet by mouth 1 hour prior to Darzalex injection appointments 10/28/21   Derek Jack, MD  Multiple Vitamin (MULTIVITAMIN WITH MINERALS) TABS tablet Take 1 tablet by mouth daily. 09/25/21   Johnson, Clanford L, MD  ondansetron (ZOFRAN) 8 MG tablet Take 1 tablet (8 mg total) by mouth every 8 (eight) hours as needed for nausea or vomiting. 09/21/21   Derek Jack, MD  pomalidomide (POMALYST) 2 MG capsule Take 1 capsule (2 mg total) by mouth daily. Take for 21 days, then hold for 7 days. Repeat every 28  days. 01/13/22   Derek Jack, MD  potassium chloride SA (KLOR-CON M) 20 MEQ tablet Take 1 tablet (20 mEq total) by mouth as needed. Take Potassium 40 meq by mouth then wait three hours and take 20 meq by mouth and wait three hours and take 40 meq by mouth for a total of Potassium 100 meq. 10/05/21   Derek Jack, MD  SSD 1 % cream Apply 1 Application topically daily. 06/10/21   [provider]    Physical Exam: Vitals:   01/13/22 1826 01/13/22 1830 01/13/22 1956 01/13/22 2000  BP: (!) 118/44 (!) 120/52 (!) 120/46 (!) 109/42  Pulse: 75 78 69 66  Resp: 18 16 (!) 25 (!) 36  Temp: 97.7 F (36.5 C)  (!) 97.5 F (36.4 C) (!) 97.5 F (36.4 C)  TempSrc: Oral  Oral Oral  SpO2: 98% 98% 100% 100%  Weight:      Height:        Physical Exam Vitals and nursing note  reviewed.  Constitutional:      Comments: Appears pale and chronically ill  HENT:     Head: Normocephalic.     Nose:     Comments: Left nare occluded Slight oozing of venous blood from right nare Cardiovascular:     Rate and Rhythm: Normal rate and regular rhythm.  Pulmonary:     Effort: Pulmonary effort is normal.     Breath sounds: Normal breath sounds.  Abdominal:     General: Bowel sounds are normal. There is no distension.     Tenderness: There is no abdominal tenderness. There is no guarding.  Skin:    General: Skin is warm and dry.     Capillary Refill: Capillary refill takes less than 2 seconds.  Neurological:     General: No focal deficit present.     Mental Status: She is alert and oriented to person, place, and time.      Labs on Admission: I have personally reviewed following labs and imaging studies  CBC: Recent Labs  Lab 01/13/22 1436  WBC 1.5*  NEUTROABS 0.6*  HGB 4.7*  HCT 14.8*  MCV 96.1  PLT 20*   Basic Metabolic Panel: Recent Labs  Lab 01/13/22 1547  NA 134*  K 4.4  CL 99  CO2 23  GLUCOSE 208*  BUN 51*  CREATININE 1.60*  CALCIUM 7.7*   GFR: Estimated  Creatinine Clearance: 24.1 mL/min (A) (by C-G formula based on SCr of 1.6 mg/dL (H)). Liver Function Tests: Recent Labs  Lab 01/13/22 1547  AST 23  ALT 26  ALKPHOS 95  BILITOT 0.6  PROT 5.9*  ALBUMIN 3.1*   No results for input(s): "LIPASE", "AMYLASE" in the last 168 hours. No results for input(s): "AMMONIA" in the last 168 hours. Coagulation Profile: No results for input(s): "INR", "PROTIME" in the last 168 hours. Cardiac Enzymes: No results for input(s): "CKTOTAL", "CKMB", "CKMBINDEX", "TROPONINI", "TROPONINIHS" in the last 168 hours. BNP (last 3 results) No results for input(s): "PROBNP" in the last 8760 hours. HbA1C: No results for input(s): "HGBA1C" in the last 72 hours. CBG: No results for input(s): "GLUCAP" in the last 168 hours. Lipid Profile: No results for input(s): "CHOL", "HDL", "LDLCALC", "TRIG", "CHOLHDL", "LDLDIRECT" in the last 72 hours. Thyroid Function Tests: No results for input(s): "TSH", "T4TOTAL", "FREET4", "T3FREE", "THYROIDAB" in the last 72 hours. Anemia Panel: No results for input(s): "VITAMINB12", "FOLATE", "FERRITIN", "TIBC", "IRON", "RETICCTPCT" in the last 72 hours. Urine analysis:    Component Value Date/Time   COLORURINE YELLOW 05/11/2019 0819   APPEARANCEUR CLEAR 05/11/2019 0819   LABSPEC 1.021 05/11/2019 0819   PHURINE 5.0 05/11/2019 0819   GLUCOSEU NEGATIVE 05/11/2019 0819   HGBUR MODERATE (A) 05/11/2019 0819   BILIRUBINUR NEGATIVE 05/11/2019 0819   KETONESUR NEGATIVE 05/11/2019 0819   PROTEINUR NEGATIVE 05/11/2019 0819   UROBILINOGEN 0.2 10/02/2008 0830   NITRITE NEGATIVE 05/11/2019 0819   LEUKOCYTESUR NEGATIVE 05/11/2019 0819    Radiological Exams on Admission: I have personally reviewed images No results found.  EKG: My personal interpretation of EKG shows: no EKG  Assessment/Plan Principal Problem:   Symptomatic anemia Active Problems:   AKI (acute kidney injury) ([place])   Epistaxis   Multiple myeloma not having achieved  remission (HCC)   Pancytopenia, acquired (HCC)   Thrombocytopenia (HCC)   DNR (do not resuscitate)/DNI(Do Not Intubate)    Assessment and Plan: * Symptomatic anemia Observation med/surg bed at [facility]. Pt received 1 unit emergency release blood at [facility]. Orders for 3 units  of PRBC. May need to ask Heme/Onc if pt would benefit from platelet transfusion as well. Repeat CBC in AM.  Epistaxis Currently has rhinorocket in left nare that was placed by EDP at [facility]. Apparently ENT was consulted over the phone. Will need outpatient follow up with ENT. If bleeds more, may need posterior pack vs Neuro IR consult for embolization.  AKI (acute kidney injury) ([place]) Likely due to acute blood loss anemia. Hold nephrotoxic agents. Hold IV lasix for now. If she develops SOB with her 3rd units of PRBC, consider small 20 mg dose of IV lasix. Repeat BMP in AM.  Thrombocytopenia (HCC) Chronic. Has been as low at 19K in the past.  Pancytopenia, acquired ([place]) Chronic.  Multiple myeloma not having achieved remission ([place]) Hold chemo for now. May need heme/onc consult to help with transfusion(PRBC +/- platelet) goals.  DNR (do not resuscitate)/DNI(Do Not Intubate) Verified DNR/DNI status with pt and her dtr Vivien Rota. Pt will need yellow DNR form completed for discharge. Dtr does not have DNR form at home.   DVT prophylaxis: SCDs Code Status: DNR/DNI(Do NOT Intubate) verified with pt and dtr Vivien Rota Family Communication: discussed with pt and dtr [name] Disposition Plan: return home  Consults called: EDP consulted [name] with ENT. [name] sent secure chat stating that pt was a patient of [name] with [facility].  Admission status: Observation, Med-Surg   Kristopher Oppenheim, DO [facility] 01/13/2022, 9:12 PM

## 2022-01-13 NOTE — Assessment & Plan Note (Signed)
Likely due to acute blood loss anemia. Hold nephrotoxic agents. Hold IV lasix for now. If she develops SOB with her 3rd units of PRBC, consider small 20 mg dose of IV lasix. Repeat BMP in AM.

## 2022-01-13 NOTE — ED Notes (Signed)
[facility] at bedside and blood product will be continued upon transport.

## 2022-01-13 NOTE — ED Provider Notes (Signed)
[facility] EMERGENCY DEPARTMENT Provider Note  CSN: [id number] Arrival date & time: 01/13/22 1328  Chief Complaint(s) Epistaxis  HPI Courtney Arnold is a 81 y.o. female with PMH A-fib not on Eliquis, multiple myeloma on active chemotherapy who presents to the emergency department for evaluation of epistaxis.  Patient states that she has had intermittent epistaxis over the last 3 days and patient's daughter states that she will blow her nose strongly and pull out large clots.  On arrival, patient has slow trickle of blood out of both anterior nares and a mild amount of blood in the posterior oropharynx but does not appear to be a brisk arterial bleed.  She arrives hemodynamically stable and denies chest pain, shortness of breath, abdominal pain, nausea, vomiting or other systemic symptoms.  Patient has endorse of headaches.   Past Medical History Past Medical History:  Diagnosis Date   Atrial fibrillation ([place]) 2011   Postop, spontaneous conversion to normal sinus after one hour   Compression fracture 07/24/2009   T12; kyphoplasty   History of echocardiogram 07/2009   EF 65%   Hypertension    Thyroid disease    Tobacco abuse    Patient Active Problem List   Diagnosis Date Noted   Epistaxis 09/27/2021   Acute blood loss anemia 09/27/2021   Sepsis ([place]) 09/27/2021   Thrombocytopenia ([place]) 09/24/2021   CAP (community acquired pneumonia) 09/23/2021   Symptomatic anemia 09/22/2021   AKI (acute kidney injury) ([place]) 09/22/2021   Pulmonary infiltrates 09/22/2021   Hyponatremia 09/22/2021   Pancytopenia, acquired ([place]) 09/17/2020   Hypocalcemia 09/17/2020   Multiple myeloma not having achieved remission ([place]) 06/07/2019   Goals of care, counseling/discussion 06/07/2019   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   Acute respiratory failure with hypoxia ([place]) 05/22/2019   Vitamin B 12 deficiency 05/22/2019   Chronic anxiety 05/22/2019   Closed fracture of right femur ([place]) 05/10/2019    Hypothyroidism 05/10/2019   Unspecified atrial fibrillation ([place]) 05/10/2019   Anxiety 05/10/2019   Anemia, unspecified 05/10/2019   Sacral lytic lesions 05/10/2019   Bone lesion 05/08/2019   Osteoarthritis of knee 04/06/2019   Common bile duct dilation 09/01/2017   Lower back pain 09/01/2017   Vitamin D deficiency 02/03/2016   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 Authorizing Provider  acyclovir (ZOVIRAX) 400 MG tablet TAKE (1) TABLET BY MOUTH TWICE DAILY. 01/11/22   Derek Jack, MD  CALCIUM PO Take 1 capsule by mouth daily.    [provider]  carboxymethylcellulose (REFRESH PLUS) 0.5 % SOLN Place 1 drop into both eyes in the morning, at noon, in the evening, and at bedtime.    [provider]  carboxymethylcellulose (REFRESH PLUS) 0.5 % SOLN Apply to eye.    [provider]  Cholecalciferol (VITAMIN D) 125 MCG (5000 UT) CAPS Take 5,000 Units by mouth daily. 05/14/19   Corinne Ports, PA-C  Cyanocobalamin (B-12 PO) Take 1  capsule by mouth daily.    [provider]  dexamethasone (DECADRON) 4 MG tablet Take 2.5 tablets (10 mg total) by mouth 2 (two) times a week. 12/24/21   Derek Jack, MD  Ferrous Sulfate (IRON PO) Take 1 capsule by mouth daily.    [provider]  fluconazole (DIFLUCAN) 100 MG tablet  10/02/21   [provider]   levothyroxine (SYNTHROID) 137 MCG tablet Take 1 tablet (137 mcg total) by mouth daily before breakfast. 02/17/21   Renato Shin, MD  montelukast (SINGULAIR) 10 MG tablet Take one tablet by mouth 1 hour prior to Darzalex injection appointments 10/28/21   Derek Jack, MD  Multiple Vitamin (MULTIVITAMIN WITH MINERALS) TABS tablet Take 1 tablet by mouth daily. 09/25/21   Johnson, Clanford L, MD  ondansetron (ZOFRAN) 8 MG tablet Take 1 tablet (8 mg total) by mouth every 8 (eight) hours as needed for nausea or vomiting. 09/21/21   Derek Jack, MD  pomalidomide (POMALYST) 2 MG capsule Take 1 capsule (2 mg total) by mouth daily. Take for 21 days, then hold for 7 days. Repeat every 28 days. 01/13/22   Derek Jack, MD  potassium chloride SA (KLOR-CON M) 20 MEQ tablet Take 1 tablet (20 mEq total) by mouth as needed. Take Potassium 40 meq by mouth then wait three hours and take 20 meq by mouth and wait three hours and take 40 meq by mouth for a total of Potassium 100 meq. 10/05/21   Derek Jack, MD  SSD 1 % cream Apply 1 Application topically daily. 06/10/21   [provider]                                                                                                                                    Past Surgical History Past Surgical History:  Procedure Laterality Date   BACK SURGERY     BACK SURGERY  06/06/2015   BREAST EXCISIONAL BIOPSY Left    50 years ago  benign   CHOLECYSTECTOMY N/A 04/25/2015   Procedure: LAPAROSCOPIC CHOLECYSTECTOMY WITH INTRAOPERATIVE CHOLANGIOGRAM;  Surgeon: Mickeal Skinner, MD;  Location: [facility];  Service: General;  Laterality: N/A;   COLONOSCOPY N/A 11/26/2015   Procedure: COLONOSCOPY;  Surgeon: Daneil Dolin, MD;  Location: [facility];  Service: Endoscopy;  Laterality: N/A;  7:30 am   ERCP N/A 04/14/2015   Procedure: ENDOSCOPIC RETROGRADE CHOLANGIOPANCREATOGRAPHY (ERCP) Biliary Sphincterotomy, 10x7 stent placement Dilated bilary  system just not well seen;  Surgeon: Rogene Houston, MD;  Location: [facility];  Service: Endoscopy;  Laterality: N/A;   ERCP N/A 06/12/2015   Procedure: ENDOSCOPIC RETROGRADE CHOLANGIOPANCREATOGRAPHY (ERCP);  Surgeon: Rogene Houston, MD;  Location: [facility];  Service: Endoscopy;  Laterality: N/A;   ESOPHAGOGASTRODUODENOSCOPY N/A 06/12/2015   Procedure: DIAGNOSTIC ESOPHAGOGASTRODUODENOSCOPY (EGD);  Surgeon: Rogene Houston, MD;  Location: [facility];  Service: Endoscopy;  Laterality: N/A;   FEMUR IM NAIL  Right 05/11/2019   Procedure: RETROGRADE INTRAMEDULLARY NAIL FEMORAL;  Surgeon: Shona Needles, MD;  Location: [place] [facility];  Service: Orthopedics;  Laterality: Right;   STENT REMOVAL  06/12/2015   Procedure: STENT REMOVAL ;  Surgeon: Rogene Houston, MD;  Location: [facility];  Service: Endoscopy;;   Family History Family History  Problem Relation Age of Onset   Heart attack Father    Stroke Father    Breast cancer Sister    Thyroid disease Neg Hx    Colon cancer Neg Hx     Social History Social History   Tobacco Use   Smoking status: Former    Packs/day: 0.15    Years: 20.00    Total pack years: 3.00    Types: Cigarettes    Quit date: 03/08/2013    Years since quitting: 8.8   Smokeless tobacco: Never  Vaping Use   Vaping Use: Never used  Substance Use Topics   Alcohol use: No    Alcohol/week: 0.0 standard drinks of alcohol   Drug use: No   Allergies Patient has no known allergies.  Review of Systems Review of Systems  HENT:  Positive for nosebleeds.   Neurological:  Positive for headaches.    Physical Exam Vital Signs  I have reviewed the triage vital signs BP (!) 112/51   Pulse 72   Temp 97.9 F (36.6 C)   Resp 18   Ht _0  (1.575 m)   Wt 61.2 kg   SpO2 97%   BMI 24.69 kg/m   Physical Exam Vitals and nursing note reviewed.  Constitutional:      General: She is not in acute distress.    Appearance: She is well-developed.  HENT:     Head: Normocephalic  and atraumatic.     Comments: Large blood clots in bilateral nares, septal deviation into the right nare Eyes:     Conjunctiva/sclera: Conjunctivae normal.  Cardiovascular:     Rate and Rhythm: Normal rate and regular rhythm.     Heart sounds: No murmur heard. Pulmonary:     Effort: Pulmonary effort is normal. No respiratory distress.     Breath sounds: Normal breath sounds.  Abdominal:     Palpations: Abdomen is soft.     Tenderness: There is no abdominal tenderness.  Musculoskeletal:        General: No swelling.     Cervical back: Neck supple.  Skin:    General: Skin is warm and dry.     Capillary Refill: Capillary refill takes less than 2 seconds.  Neurological:     Mental Status: She is alert.  Psychiatric:        Mood and Affect: Mood normal.     ED Results and Treatments Labs (all labs ordered are listed, but only abnormal results are displayed) Labs Reviewed  CBC WITH DIFFERENTIAL/PLATELET - Abnormal; Notable for the following components:      Result Value   WBC 1.5 (*)    RBC 1.54 (*)    Hemoglobin 4.7 (*)    HCT 14.8 (*)    RDW 17.2 (*)    Platelets 20 (*)    Neutro Abs 0.6 (*)    Monocytes Absolute 0.0 (*)    All other components within normal limits  COMPREHENSIVE METABOLIC PANEL - Abnormal; Notable for the following components:   Sodium 134 (*)    Glucose, Bld 208 (*)    BUN 51 (*)    Creatinine, Ser 1.60 (*)  Calcium 7.7 (*)    Total Protein 5.9 (*)    Albumin 3.1 (*)    GFR, Estimated 32 (*)    All other components within normal limits  TYPE AND SCREEN  PREPARE RBC (CROSSMATCH)  PREPARE PLATELET PHERESIS                                                                                                                          Radiology No results found.  Pertinent labs & imaging results that were available during my care of the patient were reviewed by me and considered in my medical decision making (see MDM for details).  Medications Ordered  in ED Medications  0.9 %  sodium chloride infusion (Manually program via Guardrails IV Fluids) ( Intravenous Not Given 01/13/22 1619)  oxymetazoline (AFRIN) 0.05 % nasal spray 1 spray (1 spray Each Nare Given 01/13/22 1620)  0.9 %  sodium chloride infusion (Manually program via Guardrails IV Fluids) ( Intravenous New Bag/Given 01/13/22 1650)                                                                                                                                     Procedures .Critical Care  Performed by: Teressa Lower, MD Authorized by: Teressa Lower, MD   Critical care provider statement:    Critical care time (minutes):  75   Critical care was necessary to treat or prevent imminent or life-threatening deterioration of the following conditions:  Circulatory failure (Critical anemia and thrombocytopenia requiring transfusion)   Critical care was time spent personally by me on the following activities:  Development of treatment plan with patient or surrogate, discussions with consultants, evaluation of patient's response to treatment, examination of patient, ordering and review of laboratory studies, ordering and review of radiographic studies, ordering and performing treatments and interventions, pulse oximetry, re-evaluation of patient's condition and review of old charts .Epistaxis Management  Date/Time: 01/13/2022 6:13 PM  Performed by: Teressa Lower, MD Authorized by: Teressa Lower, MD   Consent:    Consent obtained:  Verbal   Consent given by:  Patient   Risks, benefits, and alternatives were discussed: yes     Risks discussed:  Bleeding   Alternatives discussed:  No treatment, delayed treatment and alternative treatment Anesthesia:    Anesthesia method:  None Procedure details:    Treatment site:  R anterior   Treatment method:  Nasal  balloon   Treatment complexity:  Extensive   Treatment episode: recurring   Post-procedure details:    Assessment:  Bleeding  decreased   Procedure completion:  Tolerated well, no immediate complications   (including critical care time)  Medical Decision Making / ED Course   This patient presents to the ED for concern of epistaxis, this involves an extensive number of treatment options, and is a complaint that carries with it a high risk of complications and morbidity.  The differential diagnosis includes anterior epistaxis, posterior epistaxis, septal hematoma, thrombocytopenia, anemia  MDM: Patient seen in the emergency room for evaluation of epistaxis.  Physical exam with epistaxis from bilateral nares, left greater than right.  Small amount of blood in the posterior oropharynx and a large blood clot was removed from the open left nare as the patient appears to have a significant septal deviation into the right nare.  We attempted nasal packing with cotton soaked in ox metolazone but bleeding continued.  A Rhino Rocket was then placed in the left nare with decrease in the flow of bleeding.  Laboratory evaluation is quite concerning with severe pancytopenia with a 0.5, hemoglobin 4.9, platelet count of 20.  Patient is currently on a treatment regimen that interferes with crossmatching of blood and I spoke with the medical director of the blood bank Dr. Claudette Laws who approved emergency release blood for this patient as well as platelet products.  3 units packed red blood cells ordered and 1 pool of platelets was ordered.   Additional history obtained: -Additional history obtained from *** -External records from outside source obtained and reviewed including: Chart review including previous notes, labs, imaging, consultation notes   Lab Tests: -I ordered, reviewed, and interpreted labs.   The pertinent results include:   Labs Reviewed  CBC WITH DIFFERENTIAL/PLATELET - Abnormal; Notable for the following components:      Result Value   WBC 1.5 (*)    RBC 1.54 (*)    Hemoglobin 4.7 (*)    HCT 14.8 (*)    RDW  17.2 (*)    Platelets 20 (*)    Neutro Abs 0.6 (*)    Monocytes Absolute 0.0 (*)    All other components within normal limits  COMPREHENSIVE METABOLIC PANEL - Abnormal; Notable for the following components:   Sodium 134 (*)    Glucose, Bld 208 (*)    BUN 51 (*)    Creatinine, Ser 1.60 (*)    Calcium 7.7 (*)    Total Protein 5.9 (*)    Albumin 3.1 (*)    GFR, Estimated 32 (*)    All other components within normal limits  TYPE AND SCREEN  PREPARE RBC (CROSSMATCH)  PREPARE PLATELET PHERESIS      EKG ***  EKG Interpretation  Date/Time:    Ventricular Rate:    PR Interval:    QRS Duration:   QT Interval:    QTC Calculation:   R Axis:     Text Interpretation:           Imaging Studies ordered: I ordered imaging studies including *** I independently visualized and interpreted imaging. I agree with the radiologist interpretation   Medicines ordered and prescription drug management: Meds ordered this encounter  Medications   oxymetazoline (AFRIN) 0.05 % nasal spray 1 spray   0.9 %  sodium chloride infusion (Manually program via Guardrails IV Fluids)   0.9 %  sodium chloride infusion (Manually program via Guardrails IV Fluids)    -  I have reviewed the patients home medicines and have made adjustments as needed  Critical interventions ***  Consultations Obtained: I requested consultation with the ***,  and discussed lab and imaging findings as well as pertinent plan - they recommend: ***   Cardiac Monitoring: The patient was maintained on a cardiac monitor.  I personally viewed and interpreted the cardiac monitored which showed an underlying rhythm of: ***  Social Determinants of Health:  Factors impacting patients care include: ***   Reevaluation: After the interventions noted above, I reevaluated the patient and found that they have :{resolved/improved/worsened:23923::"improved"}  Co morbidities that complicate the patient evaluation  Past Medical  History:  Diagnosis Date   Atrial fibrillation ([place]) 2011   Postop, spontaneous conversion to normal sinus after one hour   Compression fracture 07/24/2009   T12; kyphoplasty   History of echocardiogram 07/2009   EF 65%   Hypertension    Thyroid disease    Tobacco abuse       Dispostion: I considered admission for this patient, ***     Final Clinical Impression(s) / ED Diagnoses Final diagnoses:  Epistaxis     _0 @

## 2022-01-13 NOTE — ED Notes (Addendum)
Patient arrived blood transfusing. CC of no pain, dizziness, or concerns.

## 2022-01-13 NOTE — ED Provider Notes (Signed)
Patient transferred from outside facility due to epistasis.  She has a history of plasma cell myeloma and presented to the [facility] emergency department with epistaxis.  No thinners.  3 units of PRBCs and 1 pool of platelets were ordered from [facility] and patient was transferred here.   Physical Exam  BP (!) 118/44   Pulse 75   Temp 97.7 F (36.5 C) (Oral)   Resp 18   Ht '5\' 2"'$  (1.575 m)   Wt 61.2 kg   SpO2 98%   BMI 24.69 kg/m   Physical Exam Vitals and nursing note reviewed.  Constitutional:      Appearance: Normal appearance.  HENT:     Head: Normocephalic and atraumatic.     Nose:     Comments: Rhino Rocket in left nare.  Slow dark ooze from the right nare.   Eyes:     General: No scleral icterus.    Conjunctiva/sclera: Conjunctivae normal.  Cardiovascular:     Rate and Rhythm: Normal rate.  Pulmonary:     Effort: Pulmonary effort is normal. No respiratory distress.  Skin:    Findings: No rash.  Neurological:     Mental Status: She is alert.  Psychiatric:        Mood and Affect: Mood normal.    Procedures  Procedures  ED Course / MDM    Medical Decision Making Amount and/or Complexity of Data Reviewed Labs: ordered.  Risk OTC drugs. Prescription drug management. Decision regarding hospitalization.   On arrival patient is not short of breath, tachycardic, dizzy or lightheaded.  She says that she feels "just fine."  7:35p: Spoke with [name] with ENT who is aware that the patient has arrived and that she is still having slow bleeding from her right nare.  He says that the hope is that it will stop with the blood products that he will follow.   Dr. Bridgett Larsson accepts the patient for admission.      Darliss Ridgel 01/13/22 2103    Tretha Sciara, MD 01/13/22 2231

## 2022-01-13 NOTE — Assessment & Plan Note (Signed)
Observation med/surg bed at [facility]. Pt received 1 unit emergency release blood at [facility]. Orders for 3 units of PRBC. May need to ask Heme/Onc if pt would benefit from platelet transfusion as well. Repeat CBC in AM.

## 2022-01-13 NOTE — ED Notes (Signed)
Emergency relase blood started at this time and verified by 2 Rns, Lyn Henri.

## 2022-01-13 NOTE — Assessment & Plan Note (Signed)
Chronic. 

## 2022-01-13 NOTE — ED Triage Notes (Signed)
Nose bleed for off and on x3 days. CA with chemo.

## 2022-01-13 NOTE — ED Notes (Signed)
ED Provider at bedside. 

## 2022-01-13 NOTE — Telephone Encounter (Signed)
Chart reviewed. Pomalyst refilled per last office note with Dr. Katragadda.  

## 2022-01-13 NOTE — ED Notes (Signed)
Patient had medium clot removed from patient's left nostril and sprayed with afrin and packed with nasal packing at this time.

## 2022-01-13 NOTE — ED Notes (Signed)
Per Lab patient's blood cannot get here until tomorrow unless patient is transferred to [facility].

## 2022-01-13 NOTE — ED Notes (Signed)
Emergency Release blood obtained from lab at this time. O positive blood unit number [id number]; expires Feb 16 2022

## 2022-01-13 NOTE — ED Provider Triage Note (Signed)
Emergency Medicine Provider Triage Evaluation Note  Courtney Arnold , a 81 y.o. female  was evaluated in triage.  Pt complains of epistaxis off and on for 3 days.  History of cancer, low platelets at baseline.  Lots of blood loss at home per daughter..  Review of Systems  Per HPI  Physical Exam  BP 95/83 (BP Location: Left Arm)   Pulse 84   Temp 97.7 F (36.5 C) (Oral)   Resp 18   Ht '5\' 2"'$  (1.575 m)   Wt 61.2 kg   SpO2 100%   BMI 24.69 kg/m  Gen:   Awake, no distress   Resp:  Normal effort  MSK:   Moves extremities without difficulty  Other:  No active bleed, blood products in nare  Medical Decision Making  Medically screening exam initiated at 2:06 PM.  Appropriate orders placed.  Courtney Arnold was informed that the remainder of the evaluation will be completed by another provider, this initial triage assessment does not replace that evaluation, and the importance of remaining in the ED until their evaluation is complete.     Sherrill Raring, PA-C 01/13/22 1406

## 2022-01-13 NOTE — Assessment & Plan Note (Signed)
Verified DNR/DNI status with pt and her dtr Vivien Rota. Pt will need yellow DNR form completed for discharge. Dtr does not have DNR form at home.

## 2022-01-14 DIAGNOSIS — R040 Epistaxis: Secondary | ICD-10-CM | POA: Diagnosis not present

## 2022-01-14 DIAGNOSIS — N179 Acute kidney failure, unspecified: Secondary | ICD-10-CM | POA: Diagnosis not present

## 2022-01-14 DIAGNOSIS — C9000 Multiple myeloma not having achieved remission: Secondary | ICD-10-CM | POA: Diagnosis not present

## 2022-01-14 DIAGNOSIS — D696 Thrombocytopenia, unspecified: Secondary | ICD-10-CM | POA: Diagnosis not present

## 2022-01-14 DIAGNOSIS — Z7901 Long term (current) use of anticoagulants: Secondary | ICD-10-CM | POA: Diagnosis not present

## 2022-01-14 DIAGNOSIS — Z79899 Other long term (current) drug therapy: Secondary | ICD-10-CM | POA: Diagnosis not present

## 2022-01-14 DIAGNOSIS — D649 Anemia, unspecified: Secondary | ICD-10-CM | POA: Diagnosis not present

## 2022-01-14 DIAGNOSIS — D62 Acute posthemorrhagic anemia: Secondary | ICD-10-CM | POA: Diagnosis not present

## 2022-01-14 DIAGNOSIS — I10 Essential (primary) hypertension: Secondary | ICD-10-CM | POA: Diagnosis not present

## 2022-01-14 DIAGNOSIS — Z66 Do not resuscitate: Secondary | ICD-10-CM | POA: Diagnosis not present

## 2022-01-14 DIAGNOSIS — I4891 Unspecified atrial fibrillation: Secondary | ICD-10-CM | POA: Diagnosis not present

## 2022-01-14 DIAGNOSIS — D61818 Other pancytopenia: Secondary | ICD-10-CM | POA: Diagnosis not present

## 2022-01-14 DIAGNOSIS — E039 Hypothyroidism, unspecified: Secondary | ICD-10-CM | POA: Diagnosis not present

## 2022-01-14 LAB — COMPREHENSIVE METABOLIC PANEL
ALT: 27 U/L (ref 0–44)
AST: 18 U/L (ref 15–41)
Albumin: 2.8 g/dL — ABNORMAL LOW (ref 3.5–5.0)
Alkaline Phosphatase: 76 U/L (ref 38–126)
Anion gap: 7 (ref 5–15)
BUN: 55 mg/dL — ABNORMAL HIGH (ref 8–23)
CO2: 26 mmol/L (ref 22–32)
Calcium: 7.6 mg/dL — ABNORMAL LOW (ref 8.9–10.3)
Chloride: 104 mmol/L (ref 98–111)
Creatinine, Ser: 1.28 mg/dL — ABNORMAL HIGH (ref 0.44–1.00)
GFR, Estimated: 42 mL/min — ABNORMAL LOW (ref >60)
Glucose, Bld: 103 mg/dL — ABNORMAL HIGH (ref 70–99)
Potassium: 4.2 mmol/L (ref 3.5–5.1)
Sodium: 137 mmol/L (ref 135–145)
Total Bilirubin: 0.7 mg/dL (ref 0.3–1.2)
Total Protein: 5.4 g/dL — ABNORMAL LOW (ref 6.5–8.1)

## 2022-01-14 LAB — CBC WITH DIFFERENTIAL/PLATELET
Abs Immature Granulocytes: 0 10*3/uL (ref 0.00–0.07)
Basophils Absolute: 0 10*3/uL (ref 0.0–0.1)
Basophils Relative: 1 %
Eosinophils Absolute: 0 10*3/uL (ref 0.0–0.5)
Eosinophils Relative: 0 %
HCT: 26.7 % — ABNORMAL LOW (ref 36.0–46.0)
Hemoglobin: 9.1 g/dL — ABNORMAL LOW (ref 12.0–15.0)
Immature Granulocytes: 0 %
Lymphocytes Relative: 52 %
Lymphs Abs: 0.7 10*3/uL (ref 0.7–4.0)
MCH: 31.1 pg (ref 26.0–34.0)
MCHC: 34.1 g/dL (ref 30.0–36.0)
MCV: 91.1 fL (ref 80.0–100.0)
Monocytes Absolute: 0.2 10*3/uL (ref 0.1–1.0)
Monocytes Relative: 17 %
Neutro Abs: 0.4 10*3/uL — CL (ref 1.7–7.7)
Neutrophils Relative %: 30 %
Platelets: 21 10*3/uL — CL (ref 150–400)
RBC: 2.93 MIL/uL — ABNORMAL LOW (ref 3.87–5.11)
RDW: 14.7 % (ref 11.5–15.5)
WBC: 1.3 10*3/uL — CL (ref 4.0–10.5)
nRBC: 0 % (ref 0.0–0.2)

## 2022-01-14 LAB — HEMOGLOBIN AND HEMATOCRIT, BLOOD
HCT: 27.8 % — ABNORMAL LOW (ref 36.0–46.0)
HCT: 30.1 % — ABNORMAL LOW (ref 36.0–46.0)
Hemoglobin: 9.7 g/dL — ABNORMAL LOW (ref 12.0–15.0)
Hemoglobin: 10.2 g/dL — ABNORMAL LOW (ref 12.0–15.0)

## 2022-01-14 LAB — ABO/RH: ABO/RH(D): O POS

## 2022-01-14 LAB — PROTIME-INR
INR: 1 (ref 0.8–1.2)
Prothrombin Time: 12.7 seconds (ref 11.4–15.2)

## 2022-01-14 LAB — MAGNESIUM: Magnesium: 2.6 mg/dL — ABNORMAL HIGH (ref 1.7–2.4)

## 2022-01-14 LAB — APTT: aPTT: 22 seconds — ABNORMAL LOW (ref 24–36)

## 2022-01-14 MED ORDER — SODIUM CHLORIDE 0.9% IV SOLUTION: Freq: Once | INTRAVENOUS | Status: AC

## 2022-01-14 MED ORDER — AMOXICILLIN-POT CLAVULANATE 500-125 MG PO TABS
1.0000 | ORAL_TABLET | Freq: Two times a day (BID) | ORAL | Status: DC
  Administered 2022-01-14 – 2022-01-15 (×2): 1 via ORAL
  Filled 2022-01-14 (×2): qty 1

## 2022-01-14 MED ORDER — ONDANSETRON HCL 4 MG/2ML IJ SOLN: 4.0000 mg | Freq: Four times a day (QID) | INTRAMUSCULAR | Status: DC | PRN

## 2022-01-14 MED ORDER — ONDANSETRON HCL 4 MG PO TABS: 4.0000 mg | ORAL_TABLET | Freq: Four times a day (QID) | ORAL | Status: DC | PRN

## 2022-01-14 MED ORDER — ACETAMINOPHEN 650 MG RE SUPP: 650.0000 mg | Freq: Four times a day (QID) | RECTAL | Status: DC | PRN

## 2022-01-14 MED ORDER — ACETAMINOPHEN 325 MG PO TABS
650.0000 mg | ORAL_TABLET | Freq: Four times a day (QID) | ORAL | Status: DC | PRN
  Administered 2022-01-14: 650 mg via ORAL
  Filled 2022-01-14: qty 2

## 2022-01-14 MED ORDER — ACYCLOVIR 400 MG PO TABS
400.0000 mg | ORAL_TABLET | Freq: Two times a day (BID) | ORAL | Status: DC
  Administered 2022-01-14 – 2022-01-15 (×3): 400 mg via ORAL
  Filled 2022-01-14 (×3): qty 1

## 2022-01-14 MED ORDER — LEVOTHYROXINE SODIUM 25 MCG PO TABS
137.0000 ug | ORAL_TABLET | Freq: Every day | ORAL | Status: DC
  Administered 2022-01-14 – 2022-01-15 (×2): 137 ug via ORAL
  Filled 2022-01-14 (×2): qty 1

## 2022-01-14 MED ORDER — ALBUTEROL SULFATE (2.5 MG/3ML) 0.083% IN NEBU: 2.5000 mg | INHALATION_SOLUTION | RESPIRATORY_TRACT | Status: DC | PRN

## 2022-01-14 NOTE — Progress Notes (Addendum)
PROGRESS NOTE    Courtney Arnold  ZMC:[id number] DOB: January 11, 1941 DOA: 01/13/2022 PCP: [name], MD    Brief Narrative:  81 year old female with a history of uncontrolled multiple myeloma, chronic pancytopenia, history of atrial fibrillation, DNR/DNI presented to the ED with history of epistaxis for 3 to 4 days.  Family had difficulty controlling epistaxis and had initially brought her to [facility]  At [facility], [facility] was placed in and patient was given PRBC due to hemoglobin of 4.7.  At baseline, patient receives nearly monthly blood transfusion.  Dr. Byers([facility]) contacted. Pt is followed by [name] with [facility].   Assessment and plan. Principal Problem:   Symptomatic anemia Active Problems:   AKI (acute kidney injury) ([place])   Epistaxis   Multiple myeloma not having achieved remission (HCC)   Pancytopenia, acquired ([place])   Thrombocytopenia ([place])   DNR (do not resuscitate)/DNI(Do Not Intubate)   Symptomatic anemia Secondary to epistaxis.  Patient received 3 unit of packed RBC, we will monitor CBC closely.  Hemoglobin after transfusion at 9.1 from 4.7.  Platelet at 21 K from 20 K.  I spoke with Dr. [name] hematology who recommended platelet transfusion to keep around 30 K..  Recommended to check PT PTT INR complete work-up.  Continue supportive care.  Epistaxis History of nasal bleeds.  Patient had a Rhino Rocket in the left nare that was placed by ED physician at [facility].  ENT, Dr Constance Holster was consulted over the phone.  Recommended to keep the packing for 5 days with outpatient follow-up on discharge.  We will add a prophylactic antibiotic since the patient is immunocompromised.   AKI (acute kidney injury) ([place]) Secondary to acute blood loss anemia.  Holding Lasix.  Received Lasix with PRBC transfusion.  Creatinine at 1.6 presentation.  Has improved to 1.2 at this time.  We will continue to monitor.  Thrombocytopenia (HCC) Chronic. Has been as low at 19K  in the past.  Platelet count of 20 K on 01/13/2022.  Please allow for transfusion at 21 K.  Will receive 1 unit of packed RBC in the context of bleeding.   Pancytopenia, acquired ([place]) Chronic.  Followed by oncology as outpatient.   Multiple myeloma not having achieved remission ([place]) Chemotherapy on hold.  Spoke with oncology Dr. [name].  We will transfuse 1 unit of packed RBC.  Currently supportive care only.    DNR (do not resuscitate)/DNI(Do Not Intubate)    DVT prophylaxis: SCDs Start: 01/14/22 0319   Code Status:     Code Status: DNR  Disposition: Home likely on 01/15/2022  Status is: Observation  The patient will require care spanning > 2 midnights and should be moved to inpatient because: Platelet transfusion, epistaxis status post nasal packing, , possible need for PRBC transfusion if bleeding.   Family Communication:  Spoke with the patient's daughter on the phone and updated her about the clinical condition of the patient.  Consultants:  Oncology Dr. [name] [facility] -Dr Constance Holster verbal consult  Procedures:  Nasal packing in the ED.  Antimicrobials:  Acyclovir Augmentin  Anti-infectives (From admission, onward)    Start     Dose/Rate Route Frequency Ordered Stop   01/14/22 1000  acyclovir (ZOVIRAX) tablet 400 mg        400 mg Oral 2 times daily 01/14/22 0318        Subjective: Today, patient was seen and examined at bedside.  Patient denies current bleeding.  Denies any dizziness, lightheadedness shortness  of breath chest pain.  Denies shortness of breath.  Objective: Vitals:   01/14/22 0245 01/14/22 0318 01/14/22 0505 01/14/22 0956  BP: (!) 131/56  130/64 (!) 100/44  Pulse: 62  63 79  Resp: _0 Temp: 97.6 F (36.4 C)  97.7 F (36.5 C) 97.6 F (36.4 C)  TempSrc:   Oral Oral  SpO2: 100%  98% 98%  Weight:  60.5 kg    Height:        Intake/Output Summary (Last 24 hours) at 01/14/2022 1157 Last data filed at 01/14/2022 1010 Gross per 24  hour  Intake 1334.17 ml  Output 0 ml  Net 1334.17 ml   Filed Weights   01/13/22 1348 01/14/22 0318  Weight: 61.2 kg 60.5 kg    Physical Examination: Body mass index is 24.4 kg/m.   General:  Average built, not in obvious distress, appears chronically ill, HENT: Pallor positive.  Oral mucosa is moist.  Left nare with Rhino pack. Chest:  Clear breath sounds.  No crackles or wheezes.  CVS: S1 &S2 heard. No murmur.  Regular rate and rhythm. Abdomen: Soft, nontender, nondistended.  Bowel sounds are heard.   Extremities: No cyanosis, clubbing or edema.  Peripheral pulses are palpable. Psych: Alert, awake and oriented, normal mood CNS:  No cranial nerve deficits.  Power equal in all extremities.   Skin: Warm and dry.  No rashes noted.  Data Reviewed:   CBC: Recent Labs  Lab 01/13/22 1436 01/14/22 0804  WBC 1.5* 1.3*  NEUTROABS 0.6* 0.4*  HGB 4.7* 9.1*  HCT 14.8* 26.7*  MCV 96.1 91.1  PLT 20* 21*    Basic Metabolic Panel: Recent Labs  Lab 01/13/22 1547 01/14/22 0804  NA 134* 137  K 4.4 4.2  CL 99 104  CO2 23 26  GLUCOSE 208* 103*  BUN 51* 55*  CREATININE 1.60* 1.28*  CALCIUM 7.7* 7.6*  MG  --  2.6*    Liver Function Tests: Recent Labs  Lab 01/13/22 1547 01/14/22 0804  AST 23 18  ALT 26 27  ALKPHOS 95 76  BILITOT 0.6 0.7  PROT 5.9* 5.4*  ALBUMIN 3.1* 2.8*     Radiology Studies: No results found.    LOS: 0 days    Flora Lipps, MD [facility] Available via Epic secure chat 7am-7pm After these hours, please refer to coverage provider listed on [email or web address] 01/14/2022, 11:57 AM

## 2022-01-14 NOTE — Consult Note (Signed)
Hematology/Oncology Consult Note  Clinical Summary: [name] is an 81 year old female with medical history significant for multiple myeloma under the care of of Dr. Delton Coombes at [facility] who presents with epistaxis.  Reason for Consult: Epistaxis and severe cytopenias in the setting of multiple myeloma  HPI: [name] is an 81 year old female with medical history significant for multiple myeloma currently under the care of Dr. Delton Coombes at [facility].  The patient was last seen by Dr. Delton Coombes on 01/06/2022.  At that time she was continued on her current therapy of Darzalex, pomalidomide, and dexamethasone.  At the time her labs showed platelet count 34 and hemoglobin 7 and white count 2.3 with ANC of 1.4.  She tends to struggle with cytopenias based on review of her records from [facility].  On 01/13/2022 Courtney Arnold presented to the [facility] emergency department with nasal bleeding.  It persisted 3 days prior to her presentation.  She notes that she will blow her nose strongly and pull out large blood clots.  She had a slow trickle of blood out of both nostrils.  A Rhino Rocket was placed in the left nostril and she received 3 units of packed red blood cells for hemoglobin of 4.7 and 1 unit of platelets for her thrombocytopenia.  ENT was consulted over the phone and recommended transfer to [facility] long hospital.  Labs collected on 01/14/2022 showed white blood cell count 1.3, hemoglobin 9.1, and platelets of 21.  Due to concern for these severe cytopenias hematology was consulted for further evaluation and management.  On exam today Courtney Arnold is accompanied by her daughter and son-in-law.  She reports that it is predominantly bleeding through her left nostril.  She is having some headache but no shortness of breath or chest pain.  She continues to eat well.  She is not currently taking any blood thinner medication that she is aware of.  She reports he had a similar  episode back in February 2023.  She previously established with [name] and ENT.  She does that she was admitted to the hospital for 7 to 10 days last time this occurred.  She has not yet met with ENT today.  Overall the bleeding appears to be under good control and she is otherwise asymptomatic.  She is eager to go home.  She otherwise denies any fevers, chills, sweats, nausea, vomiting or diarrhea.  A full 10 point ROS was otherwise negative.  O:  Vitals:   01/14/22 1504 01/14/22 1533  BP: (!) 111/41 (!) 104/58  Pulse: 77 79  Resp: 16 20  Temp: 99.1 F (37.3 C) 98 F (36.7 C)  SpO2: 98% 100%      Latest Ref Rng & Units 01/14/2022    8:04 AM 01/13/2022    3:47 PM 01/06/2022   10:03 AM  CMP  Glucose 70 - 99 mg/dL 103  208  154   BUN 8 - 23 mg/dL 55  51  30   Creatinine 0.44 - 1.00 mg/dL 1.28  1.60  0.78   Sodium 135 - 145 mmol/L 137  134  135   Potassium 3.5 - 5.1 mmol/L 4.2  4.4  4.2   Chloride 98 - 111 mmol/L 104  99  101   CO2 22 - 32 mmol/L _0 Calcium 8.9 - 10.3 mg/dL 7.6  7.7  9.1   Total Protein 6.5 - 8.1 g/dL 5.4  5.9  6.4  Total Bilirubin 0.3 - 1.2 mg/dL 0.7  0.6  0.5   Alkaline Phos 38 - 126 U/L 76  95  92   AST 15 - 41 U/L _0 ALT 0 - 44 U/L 27  26  37       Latest Ref Rng & Units 01/14/2022    4:44 PM 01/14/2022   12:15 PM 01/14/2022    8:04 AM  CBC  WBC 4.0 - 10.5 K/uL   1.3   Hemoglobin 12.0 - 15.0 g/dL 10.2  9.7  9.1   Hematocrit 36.0 - 46.0 % 30.1  27.8  26.7   Platelets 150 - 400 K/uL   21       GENERAL: Chronically ill-appearing elderly Caucasian female in NAD  SKIN: skin color, texture, turgor are normal, no rashes or significant lesions EYES: conjunctiva are pink and non-injected, sclera clear OROPHARYNX: Rhino Rocket in left nostril with crusted blood around both nostrils.  No exudate, no erythema; lips, buccal mucosa, and tongue normal  LUNGS: clear to auscultation and percussion with normal breathing effort HEART: regular rate  & rhythm and no murmurs and no lower extremity edema Musculoskeletal: no cyanosis of digits and no clubbing  PSYCH: alert & oriented x 3, fluent speech NEURO: no focal motor/sensory deficits  Assessment/Plan: Courtney Arnold is an 81 year old female with medical history significant for multiple myeloma under the care of of Dr. Delton Coombes at [facility] who presents with epistaxis.  #Epistaxis in Setting of Severe Pancytopenia -- Pancytopenia is thought to be secondary to myeloma tumor burden. -- Appreciate the care of ENT and managing this bleeding. -- Would recommend coagulation studies with PT, INR, and PTT and was assure there is no underlying coagulopathy contributing to her epistaxis. -- Patient's CBC is otherwise at baseline. -- Would recommend transfusion for Hgb <8.0 or Plt <20. Can transfuse for Plt <30 if active bleeding.  --Hematology will continue to follow.   # Relapsed IgA Kappa Plasma Cell Myeloma -- Continue management per the recommendations of Dr. Delton Coombes in the outpatient setting. -- Please assure she has close follow-up with Dr. Delton Coombes upon discharge.  Currently scheduled for labs and infusion on 01/20/2022.   Ledell Peoples, MD Department of Hematology/Oncology [place] [facility] Phone: [phone] Pager: [phone] Email: [name]

## 2022-01-14 NOTE — ED Notes (Signed)
[facility] notified that pt is ready for transport

## 2022-01-14 NOTE — Hospital Course (Addendum)
81 year old female with a history of uncontrolled multiple myeloma, chronic pancytopenia, history of atrial fibrillation, DNR/DNI presented to the ED with history of epistaxis for 3 to 4 days.  Family had difficulty controlling Xarelto had initially brought her to any pain hospital.  At any pain Rhino Rocket was placed in and patient was given PRBC due to hemoglobin of 4.7.  At baseline patient receives nearly monthly blood transfusion.  Dr. Byers([facility]) contacted. Pt is followed by [name] with [facility].   Assessment and plan.  Symptomatic anemia Secondary to epistaxis.  Patient received 3 unit of packed RBC, we will monitor CBC closely.  Hemoglobin after transfusion at 9.1 from 4.7.  Platelet at 21 K from 20 K.  I spoke with Dr. [name] heme at oncology who recommended platelet transfusion to keep around 30 K..     Epistaxis Patient had a Rhino Rocket in the left nare that was placed by ED physician at [facility].  ENT was consulted over the phone.  We will need to follow-up with ENT and need posterior packing if continues to bleed.  Spoke with [name] who recommended to speak with [facility].  Have placed the call for ENT service.   AKI (acute kidney injury) ([place]) Secondary to acute blood loss anemia.  Holding Lasix.  Received Lasix with PRBC transfusion.  We will continue to monitor with BMP.  Thrombocytopenia (HCC) Chronic. Has been as low at 19K in the past.  Platelet count of 20 K on 01/13/2022.  Please allow for transfusion at 21 K.  Will receive 1 unit of packed RBC in the context of bleeding.   Pancytopenia, acquired ([place]) Chronic.  Followed by oncology as outpatient.   Multiple myeloma not having achieved remission ([place]) Chemotherapy on hold.  Spoke with oncology Dr. [name]..  We will transfuse 1 unit of packed RBC.  Currently supportive care only.    DNR (do not resuscitate)/DNI(Do Not Intubate)

## 2022-01-15 ENCOUNTER — Other Ambulatory Visit: Payer: Self-pay

## 2022-01-15 ENCOUNTER — Encounter (HOSPITAL_COMMUNITY): Payer: Self-pay

## 2022-01-15 ENCOUNTER — Emergency Department (HOSPITAL_COMMUNITY)
Admission: EM | Admit: 2022-01-15 | Discharge: 2022-01-15 | Disposition: A | Discharge status: Home / Self Care | Payer: Medicare Other | Source: Home / Self Care | Attending: Emergency Medicine | Admitting: Emergency Medicine

## 2022-01-15 DIAGNOSIS — R040 Epistaxis: Secondary | ICD-10-CM

## 2022-01-15 DIAGNOSIS — D62 Acute posthemorrhagic anemia: Secondary | ICD-10-CM | POA: Diagnosis not present

## 2022-01-15 DIAGNOSIS — D649 Anemia, unspecified: Secondary | ICD-10-CM | POA: Diagnosis not present

## 2022-01-15 DIAGNOSIS — J3489 Other specified disorders of nose and nasal sinuses: Secondary | ICD-10-CM | POA: Diagnosis not present

## 2022-01-15 DIAGNOSIS — C9000 Multiple myeloma not having achieved remission: Secondary | ICD-10-CM | POA: Diagnosis not present

## 2022-01-15 LAB — BPAM RBC
Blood Product Expiration Date: 202312022359
Blood Product Expiration Date: 202312022359
ISSUE DATE / TIME: 202311082300
ISSUE DATE / TIME: 202311090129
Unit Type and Rh: 5100
Unit Type and Rh: 5100

## 2022-01-15 LAB — CBC
HCT: 25.2 % — ABNORMAL LOW (ref 36.0–46.0)
Hemoglobin: 8.5 g/dL — ABNORMAL LOW (ref 12.0–15.0)
MCH: 31.7 pg (ref 26.0–34.0)
MCHC: 33.7 g/dL (ref 30.0–36.0)
MCV: 94 fL (ref 80.0–100.0)
Platelets: 24 10*3/uL — CL (ref 150–400)
RBC: 2.68 MIL/uL — ABNORMAL LOW (ref 3.87–5.11)
RDW: 15.2 % (ref 11.5–15.5)
WBC: 1.1 10*3/uL — CL (ref 4.0–10.5)
nRBC: 0 % (ref 0.0–0.2)

## 2022-01-15 LAB — TYPE AND SCREEN
ABO/RH(D): O POS
Antibody Screen: POSITIVE
DAT, IgG: POSITIVE
Donor AG Type: NEGATIVE
Donor AG Type: NEGATIVE
Unit division: 0
Unit division: 0

## 2022-01-15 LAB — BPAM PLATELET PHERESIS
Blood Product Expiration Date: 202311082359
Blood Product Expiration Date: 202311112359
ISSUE DATE / TIME: 202311081643
ISSUE DATE / TIME: 202311091512
Unit Type and Rh: 6200
Unit Type and Rh: 5100

## 2022-01-15 LAB — PREPARE PLATELET PHERESIS
Unit division: 0
Unit division: 0

## 2022-01-15 LAB — BASIC METABOLIC PANEL
Anion gap: 6 (ref 5–15)
BUN: 31 mg/dL — ABNORMAL HIGH (ref 8–23)
CO2: 26 mmol/L (ref 22–32)
Calcium: 7.4 mg/dL — ABNORMAL LOW (ref 8.9–10.3)
Chloride: 104 mmol/L (ref 98–111)
Creatinine, Ser: 0.71 mg/dL (ref 0.44–1.00)
GFR, Estimated: >60 mL/min (ref >60)
Glucose, Bld: 94 mg/dL (ref 70–99)
Potassium: 3.8 mmol/L (ref 3.5–5.1)
Sodium: 136 mmol/L (ref 135–145)

## 2022-01-15 LAB — MAGNESIUM: Magnesium: 2.3 mg/dL (ref 1.7–2.4)

## 2022-01-15 LAB — HEMOGLOBIN AND HEMATOCRIT, BLOOD
HCT: 25.3 % — ABNORMAL LOW (ref 36.0–46.0)
Hemoglobin: 8.6 g/dL — ABNORMAL LOW (ref 12.0–15.0)

## 2022-01-15 MED ORDER — AMOXICILLIN-POT CLAVULANATE 500-125 MG PO TABS: 1.0000 | ORAL_TABLET | Freq: Two times a day (BID) | ORAL | 0 refills | Status: AC

## 2022-01-15 NOTE — ED Provider Notes (Signed)
[facility] DEPT Provider Note   CSN: [id number] Arrival date & time: 01/15/22  1308     History  Chief Complaint  Patient presents with   Epistaxis    Courtney Arnold is a 81 y.o. female.  HPI Patient with nosebleed discharge from hospital just prior to coming to the ER.  Was up on the floor after having had transfusions for nosebleed.  Rapid Rhino had slid out some.  They were unable to get it replaced upstairs so sent down here.  Patient feels fine.  No lightheadedness or dizziness and is eager to go home.  Is due to have follow-up with [name].    Home Medications Prior to Admission medications   Medication Sig Start Date End Date Taking? Authorizing Provider  acyclovir (ZOVIRAX) 400 MG tablet TAKE (1) TABLET BY MOUTH TWICE DAILY. Patient taking differently: Take 400 mg by mouth 2 (two) times daily. 01/11/22   Derek Jack, MD  amoxicillin-clavulanate (AUGMENTIN) 500-125 MG tablet Take 1 tablet by mouth 2 (two) times daily for 10 days. 01/15/22 01/25/22  Shawna Clamp, MD  CALCIUM PO Take 1 capsule by mouth daily.    [provider]  carboxymethylcellulose (REFRESH PLUS) 0.5 % SOLN Place 1 drop into both eyes at bedtime.    [provider]  Cholecalciferol (VITAMIN D) 125 MCG (5000 UT) CAPS Take 5,000 Units by mouth daily. 05/14/19   Corinne Ports, PA-C  Cyanocobalamin (B-12 PO) Take 1 capsule by mouth daily.    [provider]  dexamethasone (DECADRON) 4 MG tablet Take 2.5 tablets (10 mg total) by mouth 2 (two) times a week. Patient taking differently: Take 10 mg by mouth 2 (two) times a week. Takes Tuesday and Wednesday 12/24/21   Derek Jack, MD  Ferrous Sulfate (IRON PO) Take 1 capsule by mouth daily.    [provider]  levothyroxine (SYNTHROID) 137 MCG tablet Take 1 tablet (137 mcg total) by mouth daily before breakfast. 02/17/21   Renato Shin, MD  montelukast (SINGULAIR) 10 MG  tablet Take one tablet by mouth 1 hour prior to Darzalex injection appointments Patient taking differently: Take one tablet by mouth 1 hour prior to Darzalex injection appointments - given every other Wednesday 10/28/21   Derek Jack, MD  Multiple Vitamin (MULTIVITAMIN WITH MINERALS) TABS tablet Take 1 tablet by mouth daily. 09/25/21   Johnson, Clanford L, MD  ondansetron (ZOFRAN) 8 MG tablet Take 1 tablet (8 mg total) by mouth every 8 (eight) hours as needed for nausea or vomiting. 09/21/21   Derek Jack, MD  pomalidomide (POMALYST) 2 MG capsule Take 1 capsule (2 mg total) by mouth daily. Take for 21 days, then hold for 7 days. Repeat every 28 days. 01/13/22   Derek Jack, MD  potassium gluconate 595 (99 K) MG TABS tablet Take 595 mg by mouth daily.    [provider]      Allergies    Patient has no known allergies.    Review of Systems   Review of Systems  Physical Exam Updated Vital Signs Ht '5\' 2"'$  (1.575 m)   Wt 60.5 kg   BMI 24.40 kg/m  Physical Exam Vitals and nursing note reviewed.  HENT:     Nose:     Comments: Nasal balloon in place on left side but half out.  Some blood in mask. Skin:    Capillary Refill: Capillary refill takes less than 2 seconds.  Neurological:     Mental Status: She  is alert.     ED Results / Procedures / Treatments   Labs (all labs ordered are listed, but only abnormal results are displayed) Labs Reviewed - No data to display  EKG None  Radiology No results found.  Procedures Procedures    Medications Ordered in ED Medications - No data to display  ED Course/ Medical Decision Making/ A&P                           Medical Decision Making  Patient with epistaxis.  Nasal balloon out of place.  Reviewed recent discharge note and had discussed with hospitalist prior to coming down here.  Will replace balloon.  Family has discussed with Dr. Janeice Robinson office.  They state they can get her in right now.  They  requested that I not change the balloon.  Will discharge.        Final Clinical Impression(s) / ED Diagnoses Final diagnoses:  Epistaxis    Rx / DC Orders ED Discharge Orders     None         Davonna Belling, MD 01/15/22 1406

## 2022-01-15 NOTE — Progress Notes (Signed)
Nasal packing to remain in place for 5 days, per discharge instructions. Physician came to evaluate packing prior to discharge. Packing in place/no signs of bleeding. Patient discharge completed, no further questions. Awaiting daughter to come pick her up.

## 2022-01-15 NOTE — Discharge Instructions (Signed)
Advised to follow-up with primary care physician in 1 week. Advised to follow-up with the ENT [name] in 5 days. Advised to continue nasal packing for 5 days. Advised to take Augmentin 500 twice a day for 10 days.

## 2022-01-15 NOTE — Discharge Summary (Signed)
Physician Discharge Summary  Courtney Arnold QHU:[id number] DOB: 22-May-1940 DOA: 01/13/2022  PCP: [name], MD  Admit date: 01/13/2022  Discharge date: 01/15/2022  Admitted From: Home  Disposition:  Home.  Recommendations for Outpatient Follow-up:  Follow up with PCP in 1-2 weeks. Please obtain BMP/CBC in one week. Advised to follow-up with the ENT [name] in 5 days. Advised to continue nasal packing for 5 days. Advised to take Augmentin 500 twice a day for 10 days.  Home Health: None Equipment/Devices:Nasal packing  Discharge Condition: Stable CODE STATUS:Full code Diet recommendation: Heart Healthy   Brief [facility] Course: This 81 year old female with history of uncontrolled multiple myeloma, chronic pancytopenia, history of atrial fibrillation, DNR/DNI presented to the ED with history of epistaxis for 3 to 4 days.  Family had difficulty controlling epistaxis and had initially brought her to [facility].  At [facility], [name] was placed in and patient was given PRBC due to hemoglobin of 4.7.  At baseline, patient receives nearly monthly blood transfusion.  [name] ([facility]) contacted. Pt is followed by [name] with [facility].  Patient was admitted for further management.  Patient has so far 3 units of packed red blood cells,  hemoglobin remains stable 8.5 which is her usual baseline as per Dr. [name].  Epistaxis has resolved.  Patient had nasal packing.  Patient feels better,  wants to be discharged.  Patient is being discharged home.  Patient is being discharged on Augmentin twice a day for 10 days.  [name] was notified, since nasal packing came off, [name] was notified.  Appointment was made.  Patient will follow-up with [name] today in the office for checkup.  Patient is being discharged home.  Discharge Diagnoses:  Principal Problem:   Symptomatic anemia Active Problems:   AKI (acute kidney injury) ([place])   Epistaxis   Multiple myeloma  not having achieved remission (HCC)   Pancytopenia, acquired ([place])   Thrombocytopenia ([place])   DNR (do not resuscitate)/DNI(Do Not Intubate)  Symptomatic anemia Secondary to epistaxis.  Patient received 3 unit of packed RBC,  Hemoglobin after transfusion at 9.1 from 4.7. Platelet at 21 K from 20 K.  Continue supportive care.   Epistaxis: History of nasal bleeds.  Patient had a Rhino Rocket in the left nare that was placed by ED physician at [name].  ENT, Dr Constance Holster was consulted over the phone.  Recommended to keep the packing for 5 days with outpatient follow-up on discharge.  Since nasal packing fell off.  [name] is kind enough to see the patient in the clinic today.  Patient is being discharged home.   AKI (acute kidney injury) ([place]) Secondary to acute blood loss anemia.  Holding Lasix.  Received Lasix with PRBC transfusion.  Creatinine at 1.6 presentation.  Has improved to 1.2 at this time.     Thrombocytopenia (HCC) Chronic. Has been as low at 19K in the past.  Platelet count of 20 K on 01/13/2022.  Please allow for transfusion at 21 K.  S/p 1 unit PRBC.  Platelet 24.   Pancytopenia, acquired ([place]) Chronic.  Followed by oncology as outpatient.   Multiple myeloma not having achieved remission ([name]) Chemotherapy on hold.  Spoke with oncology Dr. [name].  S/p3 unit PRBC.  Currently supportive care only.    Discharge Instructions  Discharge Instructions     Call MD for:  difficulty breathing, headache or visual disturbances   Complete by: As directed  Call MD for:  persistant dizziness or light-headedness   Complete by: As directed    Call MD for:  persistant nausea and vomiting   Complete by: As directed    Diet - low sodium heart healthy   Complete by: As directed    Diet Carb Modified   Complete by: As directed    Discharge instructions   Complete by: As directed    Advised to follow-up with primary care physician in 1 week. Advised to follow-up with the ENT [name]  in 5 days. Advised to continue nasal packing for 5 days. Advised to take Augmentin 500 twice a day for 10 days.   Increase activity slowly   Complete by: As directed       Allergies as of 01/15/2022   No Known Allergies      Medication List     STOP taking these medications    potassium chloride SA 20 MEQ tablet Commonly known as: KLOR-CON M       TAKE these medications    acyclovir 400 MG tablet Commonly known as: ZOVIRAX TAKE (1) TABLET BY MOUTH TWICE DAILY. What changed: See the new instructions.   amoxicillin-clavulanate 500-125 MG tablet Commonly known as: AUGMENTIN Take 1 tablet by mouth 2 (two) times daily for 10 days.   B-12 PO Take 1 capsule by mouth daily.   CALCIUM PO Take 1 capsule by mouth daily.   carboxymethylcellulose 0.5 % Soln Commonly known as: REFRESH PLUS Place 1 drop into both eyes at bedtime.   dexamethasone 4 MG tablet Commonly known as: DECADRON Take 2.5 tablets (10 mg total) by mouth 2 (two) times a week. What changed: additional instructions   IRON PO Take 1 capsule by mouth daily.   levothyroxine 137 MCG tablet Commonly known as: SYNTHROID Take 1 tablet (137 mcg total) by mouth daily before breakfast.   montelukast 10 MG tablet Commonly known as: Singulair Take one tablet by mouth 1 hour prior to Darzalex injection appointments What changed: additional instructions   multivitamin with minerals Tabs tablet Take 1 tablet by mouth daily.   ondansetron 8 MG tablet Commonly known as: Zofran Take 1 tablet (8 mg total) by mouth every 8 (eight) hours as needed for nausea or vomiting.   pomalidomide 2 MG capsule Commonly known as: POMALYST Take 1 capsule (2 mg total) by mouth daily. Take for 21 days, then hold for 7 days. Repeat every 28 days.   potassium gluconate 595 (99 K) MG Tabs tablet Take 595 mg by mouth daily.   Vitamin D 125 MCG (5000 UT) Caps Take 5,000 Units by mouth daily.        Follow-up Information      [name], MD Follow up in 1 week(s).   Specialty: Family Medicine Contact information: [address] [place] [address] [phone]         Derek Jack, MD Follow up in 1 week(s).   Specialty: Hematology Contact information: [place] [address] [phone]         Izora Gala, MD Follow up.   Specialty: Otolaryngology Contact information: [address] [place] [address] [phone]                No Known Allergies  Consultations: ENT Hematology   Procedures/Studies: No results found.  Subjective: Patient was seen and examined at bedside.  Overnight events noted.  Patient reports doing much better,  bleeding has stopped.  Hemoglobin stayed stable.  Patient is being discharged and patient will follow-up with [name] in the office today.  Discharge Exam: Vitals:   01/15/22 0136 01/15/22 0514  BP: 115/64 (!) 102/40  Pulse: 77 85  Resp: 18 15  Temp: 98.1 F (36.7 C) 98.1 F (36.7 C)  SpO2: 98% 95%   Vitals:   01/14/22 1750 01/14/22 1915 01/15/22 0136 01/15/22 0514  BP: (!) 111/43 (!) 122/52 115/64 (!) 102/40  Pulse: 77 72 77 85  Resp: _0 Temp: 98.4 F (36.9 C) 99.2 F (37.3 C) 98.1 F (36.7 C) 98.1 F (36.7 C)  TempSrc: Oral Oral    SpO2: 98% 98% 98% 95%  Weight:      Height:        General: Pt is alert, awake, not in acute distress, nasal packing in left nostril Cardiovascular: RRR, S1/S2 +, no rubs, no gallops Respiratory: CTA bilaterally, no wheezing, no rhonchi Abdominal: Soft, NT, ND, bowel sounds + Extremities: no edema, no cyanosis    The results of significant diagnostics from this hospitalization (including imaging, microbiology, ancillary and laboratory) are listed below for reference.     Microbiology: No results found for this or any previous visit (from the past 240 hour(s)).   Labs: BNP (last 3 results) No results for input(s): "BNP" in  the last 8760 hours. Basic Metabolic Panel: Recent Labs  Lab 01/13/22 1547 01/14/22 0804 01/15/22 0504  NA 134* 137 136  K 4.4 4.2 3.8  CL 99 104 104  CO2 _1 GLUCOSE 208* 103* 94  BUN 51* 55* 31*  CREATININE 1.60* 1.28* 0.71  CALCIUM 7.7* 7.6* 7.4*  MG  --  2.6* 2.3   Liver Function Tests: Recent Labs  Lab 01/13/22 1547 01/14/22 0804  AST 23 18  ALT 26 27  ALKPHOS 95 76  BILITOT 0.6 0.7  PROT 5.9* 5.4*  ALBUMIN 3.1* 2.8*   No results for input(s): "LIPASE", "AMYLASE" in the last 168 hours. No results for input(s): "AMMONIA" in the last 168 hours. CBC: Recent Labs  Lab 01/13/22 1436 01/14/22 0804 01/14/22 1215 01/14/22 1644 01/15/22 0504 01/15/22 0835  WBC 1.5* 1.3*  --   --   --  1.1*  NEUTROABS 0.6* 0.4*  --   --   --   --   HGB 4.7* 9.1* 9.7* 10.2* 8.6* 8.5*  HCT 14.8* 26.7* 27.8* 30.1* 25.3* 25.2*  MCV 96.1 91.1  --   --   --  94.0  PLT 20* 21*  --   --   --  24*   Cardiac Enzymes: No results for input(s): "CKTOTAL", "CKMB", "CKMBINDEX", "TROPONINI" in the last 168 hours. BNP: Invalid input(s): "POCBNP" CBG: No results for input(s): "GLUCAP" in the last 168 hours. D-Dimer No results for input(s): "DDIMER" in the last 72 hours. Hgb A1c No results for input(s): "HGBA1C" in the last 72 hours. Lipid Profile No results for input(s): "CHOL", "HDL", "LDLCALC", "TRIG", "CHOLHDL", "LDLDIRECT" in the last 72 hours. Thyroid function studies No results for input(s): "TSH", "T4TOTAL", "T3FREE", "THYROIDAB" in the last 72 hours.  Invalid input(s): "FREET3" Anemia work up No results for input(s): "VITAMINB12", "FOLATE", "FERRITIN", "TIBC", "IRON", "RETICCTPCT" in the last 72 hours. Urinalysis    Component Value Date/Time   COLORURINE YELLOW 05/11/2019 [place] 05/11/2019 0819   LABSPEC 1.021 05/11/2019 0819   PHURINE 5.0 05/11/2019 0819   GLUCOSEU NEGATIVE 05/11/2019 0819   HGBUR MODERATE (A) 05/11/2019 0819   BILIRUBINUR NEGATIVE  05/11/2019 [place] 05/11/2019 0819   PROTEINUR NEGATIVE 05/11/2019 0819   UROBILINOGEN 0.2 10/02/2008 0830   NITRITE NEGATIVE 05/11/2019 0819   LEUKOCYTESUR NEGATIVE 05/11/2019 0819   Sepsis Labs Recent Labs  Lab 01/13/22 1436 01/14/22 0804 01/15/22 0835  WBC 1.5* 1.3* 1.1*   Microbiology No results found for this or any previous visit (from the past 240 hour(s)).   Time coordinating discharge: Over 30 minutes  SIGNED:   Shawna Clamp, MD  [facility] 01/15/2022, 2:39 PM Pager   If 7PM-7AM, please contact night-coverage

## 2022-01-15 NOTE — ED Triage Notes (Signed)
Pt dc from hospital was supposed to follow up with ent. Per daughter is still having nose bleeds

## 2022-01-18 ENCOUNTER — Encounter: Payer: Self-pay | Admitting: *Deleted

## 2022-01-18 LAB — TYPE AND SCREEN
ABO/RH(D): O POS
Antibody Screen: POSITIVE
DAT, IgG: POSITIVE

## 2022-01-18 NOTE — Progress Notes (Signed)
Courtney Arnold' daughter [name] was contacted by telephone to verify understanding of discharge instructions status post their most recent discharge from the hospital on the date:  01/15/22.  Inpatient discharge AVS was re-reviewed with patient, along with cancer center appointments.  Verification of understanding for oncology specific follow-up was validated using the Teach Back method.    Transportation to appointments were confirmed for the patient as being self/caregiver.  Toni's questions were addressed to their satisfaction upon completion of this post discharge follow-up call for outpatient oncology.  Appointments reviewed for 11/15 and patient will be present.

## 2022-01-19 ENCOUNTER — Other Ambulatory Visit: Payer: Self-pay

## 2022-01-19 DIAGNOSIS — C9000 Multiple myeloma not having achieved remission: Secondary | ICD-10-CM

## 2022-01-20 ENCOUNTER — Inpatient Hospital Stay: Payer: Medicare Other

## 2022-01-20 ENCOUNTER — Ambulatory Visit: Payer: Medicare Other | Admitting: Hematology

## 2022-01-20 VITALS — BP 121/49 | HR 58 | Temp 97.6°F | Resp 16 | Wt 132.0 lb

## 2022-01-20 DIAGNOSIS — C9000 Multiple myeloma not having achieved remission: Secondary | ICD-10-CM

## 2022-01-20 DIAGNOSIS — Z5112 Encounter for antineoplastic immunotherapy: Secondary | ICD-10-CM | POA: Diagnosis not present

## 2022-01-20 DIAGNOSIS — D649 Anemia, unspecified: Secondary | ICD-10-CM

## 2022-01-20 LAB — COMPREHENSIVE METABOLIC PANEL
ALT: 28 U/L (ref 0–44)
AST: 24 U/L (ref 15–41)
Albumin: 2.9 g/dL — ABNORMAL LOW (ref 3.5–5.0)
Alkaline Phosphatase: 99 U/L (ref 38–126)
Anion gap: 11 (ref 5–15)
BUN: 20 mg/dL (ref 8–23)
CO2: 25 mmol/L (ref 22–32)
Calcium: 8.4 mg/dL — ABNORMAL LOW (ref 8.9–10.3)
Chloride: 101 mmol/L (ref 98–111)
Creatinine, Ser: 0.71 mg/dL (ref 0.44–1.00)
GFR, Estimated: >60 mL/min (ref >60)
Glucose, Bld: 169 mg/dL — ABNORMAL HIGH (ref 70–99)
Potassium: 3.5 mmol/L (ref 3.5–5.1)
Sodium: 137 mmol/L (ref 135–145)
Total Bilirubin: 0.3 mg/dL (ref 0.3–1.2)
Total Protein: 6.3 g/dL — ABNORMAL LOW (ref 6.5–8.1)

## 2022-01-20 LAB — BPAM RBC
Blood Product Expiration Date: 202312122359
Blood Product Expiration Date: 202312162359
Blood Product Expiration Date: 202312162359
Blood Product Expiration Date: 202312162359
ISSUE DATE / TIME: 202311081802
Unit Type and Rh: 5100
Unit Type and Rh: 5100
Unit Type and Rh: 5100
Unit Type and Rh: 5100

## 2022-01-20 LAB — TYPE AND SCREEN
ABO/RH(D): O POS
Antibody Screen: POSITIVE
Donor AG Type: NEGATIVE
Donor AG Type: NEGATIVE
Donor AG Type: NEGATIVE
Unit division: 0
Unit division: 0
Unit division: 0
Unit division: 0

## 2022-01-20 LAB — SAMPLE TO BLOOD BANK

## 2022-01-20 LAB — CBC WITH DIFFERENTIAL/PLATELET
Basophils Relative: 0 %
Eosinophils Relative: 0 %
HCT: 26.7 % — ABNORMAL LOW (ref 36.0–46.0)
Hemoglobin: 8.8 g/dL — ABNORMAL LOW (ref 12.0–15.0)
Lymphocytes Relative: 43 %
MCH: 31.2 pg (ref 26.0–34.0)
MCHC: 33 g/dL (ref 30.0–36.0)
MCV: 94.7 fL (ref 80.0–100.0)
Monocytes Relative: 6 %
Neutro Abs: 0.8 10*3/uL — ABNORMAL LOW (ref 1.7–7.7)
Neutrophils Relative %: 51 %
Platelets: 26 10*3/uL — CL (ref 150–400)
RBC: 2.82 MIL/uL — ABNORMAL LOW (ref 3.87–5.11)
RDW: 14.6 % (ref 11.5–15.5)
WBC: 1.6 10*3/uL — ABNORMAL LOW (ref 4.0–10.5)
nRBC: 0 % (ref 0.0–0.2)

## 2022-01-20 LAB — LACTATE DEHYDROGENASE: LDH: 121 U/L (ref 98–192)

## 2022-01-20 LAB — MAGNESIUM: Magnesium: 2.2 mg/dL (ref 1.7–2.4)

## 2022-01-20 MED ORDER — DARATUMUMAB-HYALURONIDASE-FIHJ 1800-30000 MG-UT/15ML ~~LOC~~ SOLN
1800.0000 mg | Freq: Once | SUBCUTANEOUS | Status: AC
  Administered 2022-01-20: 1800 mg via SUBCUTANEOUS
  Filled 2022-01-20: qty 15

## 2022-01-20 NOTE — Progress Notes (Signed)
CRITICAL VALUE ALERT Critical value received:  Platelets 26 Date of notification:  01-20-2022 Time of notification: 13:02 pm. Critical value read back:  Yes.   Nurse who received alert:  B.Trent Theisen RN. MD notified time and response:  Dr. Delton Coombes @ 13:09 pm. HGB 8.8 today.

## 2022-01-20 NOTE — Progress Notes (Signed)
Patient presents today for Daratumumab injection per providers order.  Vital signs within parameters.  Labs reviewed and ANC noted to be 0.8, platelets 26, MD notified.  Message received from Anastasio Champion RN/Dr. Delton Coombes okay to proceed with treatment.  Patient took premedications at home.  Stable during administration without incident; injection site WNL; see MAR for injection details.  Patient tolerated procedure well and without incident.  No questions or complaints noted at this time.  Discharge from clinic via wheelchair in stable condition.  Alert and oriented X 3.  Follow up with [facility] as scheduled.

## 2022-01-20 NOTE — Progress Notes (Signed)
Ok to treat with platelets 26k  T.O. Dr Rhys Martini, PharmD

## 2022-01-21 LAB — KAPPA/LAMBDA LIGHT CHAINS
Kappa free light chain: 17.5 mg/L (ref 3.3–19.4)
Kappa, lambda light chain ratio: 11.67 — ABNORMAL HIGH (ref 0.26–1.65)
Lambda free light chains: 1.5 mg/L — ABNORMAL LOW (ref 5.7–26.3)

## 2022-01-25 ENCOUNTER — Other Ambulatory Visit: Payer: Self-pay

## 2022-01-25 DIAGNOSIS — Z5112 Encounter for antineoplastic immunotherapy: Secondary | ICD-10-CM | POA: Diagnosis not present

## 2022-01-25 LAB — PROTEIN ELECTROPHORESIS, SERUM
A/G Ratio: 1 (ref 0.7–1.7)
Albumin ELP: 2.9 g/dL (ref 2.9–4.4)
Alpha-1-Globulin: 0.4 g/dL (ref 0.0–0.4)
Alpha-2-Globulin: 0.9 g/dL (ref 0.4–1.0)
Beta Globulin: 1.5 g/dL — ABNORMAL HIGH (ref 0.7–1.3)
Gamma Globulin: 0.2 g/dL — ABNORMAL LOW (ref 0.4–1.8)
Globulin, Total: 3 g/dL (ref 2.2–3.9)
M-Spike, %: 0.7 g/dL — ABNORMAL HIGH
Total Protein ELP: 5.9 g/dL — ABNORMAL LOW (ref 6.0–8.5)

## 2022-02-03 ENCOUNTER — Inpatient Hospital Stay: Payer: Medicare Other

## 2022-02-03 ENCOUNTER — Inpatient Hospital Stay (HOSPITAL_BASED_OUTPATIENT_CLINIC_OR_DEPARTMENT_OTHER): Payer: Medicare Other | Admitting: Hematology

## 2022-02-03 DIAGNOSIS — C9000 Multiple myeloma not having achieved remission: Secondary | ICD-10-CM

## 2022-02-03 DIAGNOSIS — D649 Anemia, unspecified: Secondary | ICD-10-CM

## 2022-02-03 DIAGNOSIS — Z5112 Encounter for antineoplastic immunotherapy: Secondary | ICD-10-CM | POA: Diagnosis not present

## 2022-02-03 LAB — COMPREHENSIVE METABOLIC PANEL
ALT: 24 U/L (ref 0–44)
AST: 16 U/L (ref 15–41)
Albumin: 3.1 g/dL — ABNORMAL LOW (ref 3.5–5.0)
Alkaline Phosphatase: 116 U/L (ref 38–126)
Anion gap: 12 (ref 5–15)
BUN: 43 mg/dL — ABNORMAL HIGH (ref 8–23)
CO2: 25 mmol/L (ref 22–32)
Calcium: 8.7 mg/dL — ABNORMAL LOW (ref 8.9–10.3)
Chloride: 99 mmol/L (ref 98–111)
Creatinine, Ser: 1.11 mg/dL — ABNORMAL HIGH (ref 0.44–1.00)
GFR, Estimated: 50 mL/min — ABNORMAL LOW (ref >60)
Glucose, Bld: 138 mg/dL — ABNORMAL HIGH (ref 70–99)
Potassium: 3.5 mmol/L (ref 3.5–5.1)
Sodium: 136 mmol/L (ref 135–145)
Total Bilirubin: 0.6 mg/dL (ref 0.3–1.2)
Total Protein: 6.6 g/dL (ref 6.5–8.1)

## 2022-02-03 LAB — CBC WITH DIFFERENTIAL/PLATELET
Abs Immature Granulocytes: 0 10*3/uL (ref 0.00–0.07)
Basophils Absolute: 0 10*3/uL (ref 0.0–0.1)
Basophils Relative: 0 %
Eosinophils Absolute: 0 10*3/uL (ref 0.0–0.5)
Eosinophils Relative: 0 %
HCT: 20.9 % — ABNORMAL LOW (ref 36.0–46.0)
Hemoglobin: 6.8 g/dL — CL (ref 12.0–15.0)
Lymphocytes Relative: 35 %
Lymphs Abs: 0.6 10*3/uL — ABNORMAL LOW (ref 0.7–4.0)
MCH: 31.6 pg (ref 26.0–34.0)
MCHC: 32.5 g/dL (ref 30.0–36.0)
MCV: 97.2 fL (ref 80.0–100.0)
Monocytes Absolute: 0.1 10*3/uL (ref 0.1–1.0)
Monocytes Relative: 6 %
Neutro Abs: 1.1 10*3/uL — ABNORMAL LOW (ref 1.7–7.7)
Neutrophils Relative %: 59 %
Platelets: 52 10*3/uL — ABNORMAL LOW (ref 150–400)
RBC: 2.15 MIL/uL — ABNORMAL LOW (ref 3.87–5.11)
RDW: 16.3 % — ABNORMAL HIGH (ref 11.5–15.5)
WBC: 1.8 10*3/uL — ABNORMAL LOW (ref 4.0–10.5)
nRBC: 0 % (ref 0.0–0.2)

## 2022-02-03 LAB — SAMPLE TO BLOOD BANK

## 2022-02-03 LAB — MAGNESIUM: Magnesium: 2.5 mg/dL — ABNORMAL HIGH (ref 1.7–2.4)

## 2022-02-03 LAB — PREPARE RBC (CROSSMATCH)

## 2022-02-03 MED ORDER — DARATUMUMAB-HYALURONIDASE-FIHJ 1800-30000 MG-UT/15ML ~~LOC~~ SOLN
1800.0000 mg | Freq: Once | SUBCUTANEOUS | Status: AC
  Administered 2022-02-03: 1800 mg via SUBCUTANEOUS
  Filled 2022-02-03: qty 15

## 2022-02-03 NOTE — Patient Instructions (Signed)
Courtney Arnold at [facility] Discharge Instructions   You were seen and examined today by Dr. Delton Coombes.  He reviewed the results of your lab work which are stable.   We will proceed with your treatment today.  We will give you 2 units blood tomorrow.   Return as scheduled.     Thank you for choosing [facility] at [facility] to provide your oncology and hematology care.  To afford each patient quality time with our provider, please arrive at least 15 minutes before your scheduled appointment time.   If you have a lab appointment with the [facility] please come in thru the Main Entrance and check in at the main information desk.  You need to re-schedule your appointment should you arrive 10 or more minutes late.  We strive to give you quality time with our providers, and arriving late affects you and other patients whose appointments are after yours.  Also, if you no show three or more times for appointments you may be dismissed from the clinic at the providers discretion.     Again, thank you for choosing [facility].  Our hope is that these requests will decrease the amount of time that you wait before being seen by our physicians.       _____________________________________________________________  Should you have questions after your visit to [facility] please contact our office at [phone] and follow the prompts.  Our office hours are 8:00 a.m. and 4:30 p.m. Monday - Friday.  Please note that voicemails left after 4:00 p.m. may not be returned until the following business day.  We are closed weekends and major holidays.  You do have access to a nurse 24-7, just call the main number to the clinic [phone] and do not press any options, hold on the line and a nurse will answer the phone.    For prescription refill requests, have your pharmacy contact our office and allow 72 hours.    Due to Covid, you  will need to wear a mask upon entering the hospital. If you do not have a mask, a mask will be given to you at the Main Entrance upon arrival. For doctor visits, patients may have 1 support person age 8 or older with them. For treatment visits, patients can not have anyone with them due to social distancing guidelines and our immunocompromised population.

## 2022-02-03 NOTE — Progress Notes (Signed)
CRITICAL VALUE ALERT Critical value received:  HGB 6.8. Date of notification:  02-03-2022 Time of notification: 12:23 pm. Critical value read back:  Yes.   Nurse who received alert:  C. Page RN.  MD notified time and response:  Dr. Delton Coombes @ 12:42 pm. Standing orders followed.   Written orders received by secure chat to give patient 2 units of blood 02-04-2022 per Dr. Freddy Jaksch.

## 2022-02-03 NOTE — Progress Notes (Signed)
[address] [place], [place] [address]   CLINIC:  Medical Oncology/Hematology  PCP:  [name], MD [address] / [place] [address] [phone]   REASON FOR VISIT:  Follow-up for IgA kappa plasma cell myeloma  PRIOR THERAPY: Velcade x 9 cycles from 07/04/2019 to 01/10/2020  CURRENT THERAPY: Pomalidomide, daratumumab and dexamethasone  BRIEF ONCOLOGIC HISTORY:  Oncology History  Multiple myeloma not having achieved remission ([facility])  06/07/2019 Initial Diagnosis   Multiple myeloma not having achieved remission ([facility])   07/04/2019 - 01/10/2020 Chemotherapy   The patient had dexamethasone (DECADRON) 4 MG tablet, 1 of 1 cycle, Start date: 01/10/2020, End date: -- lenalidomide (REVLIMID) 15 MG capsule, 1 of 1 cycle, Start date: 02/26/2020, End date: -- bortezomib SQ (VELCADE) chemo injection 2.25 mg, 1.3 mg/m2 = 2.25 mg, Subcutaneous,  Once, 9 of 10 cycles Administration: 2.25 mg (07/04/2019), 2.25 mg (07/11/2019), 2.25 mg (07/26/2019), 2.25 mg (08/02/2019), 2.25 mg (08/09/2019), 2.25 mg (08/16/2019), 2.25 mg (08/23/2019), 2.25 mg (08/30/2019), 2.25 mg (09/06/2019), 2.25 mg (09/13/2019), 2.25 mg (09/20/2019), 2.25 mg (09/27/2019), 2.25 mg (10/04/2019), 2.25 mg (10/11/2019), 2.25 mg (10/18/2019), 2.25 mg (10/25/2019), 2.25 mg (11/01/2019), 2.25 mg (11/08/2019), 2.25 mg (11/15/2019), 2.25 mg (11/22/2019), 2.25 mg (11/29/2019), 2.25 mg (12/06/2019), 2.25 mg (12/13/2019), 2.25 mg (12/20/2019), 2.25 mg (12/27/2019), 2.25 mg (01/03/2020), 2.25 mg (01/10/2020)  for chemotherapy treatment.    09/15/2021 - 11/04/2021 Chemotherapy   Patient is on Treatment Plan : MYELOMA RELAPSED REFRACTORY Daratumumab SQ + Pomalidomide + Dexamethasone (DaraPd) q28d     09/15/2021 -  Chemotherapy   Patient is on Treatment Plan : MYELOMA RELAPSED REFRACTORY Daratumumab SQ + Pomalidomide + Dexamethasone (DaraPd) q28d       CANCER STAGING:  Cancer Staging  No matching staging information was found for the  patient.  INTERVAL HISTORY:  Ms. THESSALY MCCULLERS, a 81 y.o. female, seen for follow-up of multiple myeloma.  She is accompanied by her daughter today.  She was recently hospitalized with nosebleeds from 01/13/2022 through 01/15/2022 with a hemoglobin of 4.7 and had to receive blood transfusions.  She was seen by [name] as an outpatient after discharge.  She has lost about 6 pounds in the last 1 month.  She reports decreased eating for 4 to 5 days around the time of hospitalization.  She is reportedly eating better when she takes steroids.  She also took antibiotics for 7 days after DC from the hospital.   REVIEW OF SYSTEMS:  Review of Systems  Constitutional:  Negative for appetite change and fatigue.  HENT:   Negative for nosebleeds.   Respiratory:  Negative for cough.   Gastrointestinal:  Negative for blood in stool.  Neurological:  Positive for headaches.  All other systems reviewed and are negative.   PAST MEDICAL/SURGICAL HISTORY:  Past Medical History:  Diagnosis Date   Atrial fibrillation ([facility]) 2011   Postop, spontaneous conversion to normal sinus after one hour   CAP (community acquired pneumonia) 09/23/2021   Cholecystitis, acute 04/25/2015   Compression fracture 07/24/2009   T12; kyphoplasty   History of echocardiogram 07/2009   EF 65%   Hypertension    Thyroid disease    Tobacco abuse    Past Surgical History:  Procedure Laterality Date   BACK SURGERY     BACK SURGERY  06/06/2015   BREAST EXCISIONAL BIOPSY Left    50 years ago  benign   CHOLECYSTECTOMY N/A 04/25/2015   Procedure: LAPAROSCOPIC CHOLECYSTECTOMY WITH INTRAOPERATIVE CHOLANGIOGRAM;  Surgeon: Arta Bruce Kinsinger, MD;  Location: [facility];  Service: General;  Laterality: N/A;   COLONOSCOPY N/A 11/26/2015   Procedure: COLONOSCOPY;  Surgeon: Daneil Dolin, MD;  [facility];  Service: Endoscopy;  Laterality: N/A;  7:30 am   ERCP N/A 04/14/2015   Procedure: ENDOSCOPIC RETROGRADE  CHOLANGIOPANCREATOGRAPHY (ERCP) Biliary Sphincterotomy, 10x7 stent placement Dilated bilary system just not well seen;  Surgeon: Rogene Houston, MD;  Location: [facility];  Service: Endoscopy;  Laterality: N/A;   ERCP N/A 06/12/2015   Procedure: ENDOSCOPIC RETROGRADE CHOLANGIOPANCREATOGRAPHY (ERCP);  Surgeon: Rogene Houston, MD;  [facility];  Service: Endoscopy;  Laterality: N/A;   ESOPHAGOGASTRODUODENOSCOPY N/A 06/12/2015   Procedure: DIAGNOSTIC ESOPHAGOGASTRODUODENOSCOPY (EGD);  Surgeon: Rogene Houston, MD;  [facility];  Service: Endoscopy;  Laterality: N/A;   FEMUR IM NAIL Right 05/11/2019   Procedure: RETROGRADE INTRAMEDULLARY NAIL FEMORAL;  Surgeon: Shona Needles, MD;  Location: [facility];  Service: Orthopedics;  Laterality: Right;   STENT REMOVAL  06/12/2015   Procedure: STENT REMOVAL ;  Surgeon: Rogene Houston, MD;  [facility];  Service: Endoscopy;;    SOCIAL HISTORY:  Social History   Socioeconomic History   Marital status: Widowed    Spouse name: Not on file   Number of children: 2   Years of education: Not on file   Highest education level: Not on file  Occupational History   Occupation: retired  Tobacco Use   Smoking status: Former    Packs/day: 0.15    Years: 20.00    Total pack years: 3.00    Types: Cigarettes    Quit date: 03/08/2013    Years since quitting: 8.9   Smokeless tobacco: Never  Vaping Use   Vaping Use: Never used  Substance and Sexual Activity   Alcohol use: No    Alcohol/week: 0.0 standard drinks of alcohol   Drug use: No   Sexual activity: Not Currently  Other Topics Concern   Not on file  Social History Narrative   Active in gardens and does yard work.   Social Determinants of Health   Financial Resource Strain: Low Risk  (05/08/2019)   Overall Financial Resource Strain (CARDIA)    Difficulty of Paying Living Expenses: Not hard at all  Food Insecurity: No Food Insecurity (01/14/2022)   Hunger Vital Sign    Worried  About Running Out of Food in the Last Year: Never true    Ran Out of Food in the Last Year: Never true  Transportation Needs: No Transportation Needs (01/14/2022)   PRAPARE - Hydrologist (Medical): No    Lack of Transportation (Non-Medical): No  Physical Activity: Inactive (05/08/2019)   Exercise Vital Sign    Days of Exercise per Week: 0 days    Minutes of Exercise per Session: 0 min  Stress: No Stress Concern Present (05/08/2019)   [place]    Feeling of Stress : Not at all  Social Connections: Moderately Isolated (02/06/2020)   Social Connection and Isolation Panel [NHANES]    Frequency of Communication with Friends and Family: More than three times a week    Frequency of Social Gatherings with Friends and Family: More than three times a week    Attends Religious Services: More than 4 times per year    Active Member of [employer] or Organizations: No    Attends Archivist Meetings: Never  Marital Status: Widowed  Intimate Partner Violence: Not At Risk (01/14/2022)   Humiliation, Afraid, Rape, and Kick questionnaire    Fear of Current or Ex-Partner: No    Emotionally Abused: No    Physically Abused: No    Sexually Abused: No    FAMILY HISTORY:  Family History  Problem Relation Age of Onset   Heart attack Father    Stroke Father    Breast cancer Sister    Thyroid disease Neg Hx    Colon cancer Neg Hx     CURRENT MEDICATIONS:  Current Outpatient Medications  Medication Sig Dispense Refill   acyclovir (ZOVIRAX) 400 MG tablet TAKE (1) TABLET BY MOUTH TWICE DAILY. (Patient taking differently: Take 400 mg by mouth 2 (two) times daily.) 60 tablet 6   CALCIUM PO Take 1 capsule by mouth daily.     carboxymethylcellulose (REFRESH PLUS) 0.5 % SOLN Place 1 drop into both eyes at bedtime.     Cholecalciferol (VITAMIN D) 125 MCG (5000 UT) CAPS Take 5,000 Units by mouth daily. 90 capsule 1    Cyanocobalamin (B-12 PO) Take 1 capsule by mouth daily.     dexamethasone (DECADRON) 4 MG tablet Take 2.5 tablets (10 mg total) by mouth 2 (two) times a week. (Patient taking differently: Take 10 mg by mouth 2 (two) times a week. Takes Tuesday and Wednesday) 20 tablet 4   Ferrous Sulfate (IRON PO) Take 1 capsule by mouth daily.     levothyroxine (SYNTHROID) 137 MCG tablet Take 1 tablet (137 mcg total) by mouth daily before breakfast. 90 tablet 3   montelukast (SINGULAIR) 10 MG tablet Take one tablet by mouth 1 hour prior to Darzalex injection appointments (Patient taking differently: Take one tablet by mouth 1 hour prior to Darzalex injection appointments - given every other Wednesday) 30 tablet 2   Multiple Vitamin (MULTIVITAMIN WITH MINERALS) TABS tablet Take 1 tablet by mouth daily.     ondansetron (ZOFRAN) 8 MG tablet Take 1 tablet (8 mg total) by mouth every 8 (eight) hours as needed for nausea or vomiting. 60 tablet 0   pomalidomide (POMALYST) 2 MG capsule Take 1 capsule (2 mg total) by mouth daily. Take for 21 days, then hold for 7 days. Repeat every 28 days. 21 capsule 0   potassium gluconate 595 (99 K) MG TABS tablet Take 595 mg by mouth daily.     No current facility-administered medications for this visit.   Facility-Administered Medications Ordered in Other Visits  Medication Dose Route Frequency Provider Last Rate Last Admin   0.9 %  sodium chloride infusion (Manually program via Guardrails IV Fluids)  250 mL Intravenous Once Derek Jack, MD       0.9 %  sodium chloride infusion   Intravenous Continuous Derek Jack, MD   Stopped at 09/15/21 1454    ALLERGIES:  No Known Allergies  PHYSICAL EXAM:  Performance status (ECOG): 1 - Symptomatic but completely ambulatory  Vitals:   02/03/22 1259  BP: (!) 118/49  Pulse: 67  Resp: 17  Temp: 98.1 F (36.7 C)  SpO2: 100%    Wt Readings from Last 3 Encounters:  02/03/22 125 lb 6.4 oz (56.9 kg)  01/20/22 132 lb  (59.9 kg)  01/15/22 133 lb 6.1 oz (60.5 kg)   Physical Exam Vitals reviewed.  Constitutional:      Appearance: Normal appearance.  Cardiovascular:     Rate and Rhythm: Normal rate and regular rhythm.     Pulses: Normal pulses.  Heart sounds: Normal heart sounds.  Pulmonary:     Effort: Pulmonary effort is normal.     Breath sounds: Normal breath sounds.  Neurological:     General: No focal deficit present.     Mental Status: She is alert and oriented to person, place, and time.  Psychiatric:        Mood and Affect: Mood normal.        Behavior: Behavior normal.   Blood in the back of the throat.  LABORATORY DATA:  I have reviewed the labs as listed.     Latest Ref Rng & Units 02/03/2022   12:04 PM 01/20/2022   11:53 AM 01/15/2022    8:35 AM  CBC  WBC 4.0 - 10.5 K/uL 1.8  1.6  1.1   Hemoglobin 12.0 - 15.0 g/dL 6.8  8.8  8.5   Hematocrit 36.0 - 46.0 % 20.9  26.7  25.2   Platelets 150 - 400 K/uL 52  26  24       Latest Ref Rng & Units 02/03/2022   12:04 PM 01/20/2022   12:30 PM 01/15/2022    5:04 AM  CMP  Glucose 70 - 99 mg/dL 138  169  94   BUN 8 - 23 mg/dL 43  20  31   Creatinine 0.44 - 1.00 mg/dL 1.11  0.71  0.71   Sodium 135 - 145 mmol/L 136  137  136   Potassium 3.5 - 5.1 mmol/L 3.5  3.5  3.8   Chloride 98 - 111 mmol/L 99  101  104   CO2 22 - 32 mmol/L _0 Calcium 8.9 - 10.3 mg/dL 8.7  8.4  7.4   Total Protein 6.5 - 8.1 g/dL 6.6  6.3    Total Bilirubin 0.3 - 1.2 mg/dL 0.6  0.3    Alkaline Phos 38 - 126 U/L 116  99    AST 15 - 41 U/L 16  24    ALT 0 - 44 U/L 24  28      DIAGNOSTIC IMAGING:  I have independently reviewed the scans and discussed with the patient. No results found.   ASSESSMENT:  1.  IgA kappa plasma cell myeloma: -IgA kappa plasma cell myeloma diagnosed on right sacral bone biopsy on 05/17/2019.  SPEP did not show M spike.  Immunofixation shows IgA kappa.  Kappa light chains 42.8, lambda light chains at 9.9, ratio 4.32.  Beta-2  microglobulin 3.7. -PET scan on 06/04/2019 showed expansile lytic lesion involving the right sacral wing.  Lytic lesion involving T10 vertebral body.  Left seventh rib lesion.  Increased uptake in a pathological fracture involving distal aspect of the right femur. 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 -RVD started on 06/26/2019.  Revlimid 15 mg 2 weeks on/1 week off. -Velcade discontinued on 01/10/2020. -She is taking Revlimid 15 mg 2 weeks on 1 week off, with dexamethasone 20 mg on weeks of Revlimid and 10 mg while she is off Revlimid.  Revlimid dose further reduced to 10 mg 3 weeks on/1 week off.  She has developed progressive anemia. - BMBX (08/07/2021): 38% plasma cells in the marrow.  Cytogenetics was normal.  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 - DPD regimen started on, single agent Darzalex and dexamethasone started on 09/15/2021.  Daratumumab held after first treatment due to pneumonia. - Pomalidomide 2 mg 3 weeks  on/1 week off and dexamethasone 20 mg weekly started on 10/06/2021.  Daratumumab added on 10/21/2021.   PLAN:  1.  Relapsed IgA kappa plasma cell myeloma: - She was hospitalized from 01/13/2022 through 01/15/2022 with nosebleed and hemoglobin of 4.7.  She had received 2 PRBC transfusion and nasal packs.  She was seen by [name] as outpatient. - She lost about 6 pounds in the last 1 month due to decreased eating around the time of recent hospital admission.  She is eating better when she takes steroids.  She also completed a 7-day course of antibiotic after DC from the hospital. - She started Pomalyst 3 weeks on/1 week off on 01/26/2022. - Reviewed myeloma labs from 01/20/2022 which showed M spike increased to 0.7 g from 0.4 g.  However free light chain ratio has improved to 11.67 from 12.10.  Kappa light chains improved to 17.5 from 24.2. - She may proceed with daratumumab today and every 2 weeks.  I  will reevaluate her in 2 weeks.  Will send myeloma panel at next visit.   2.  Hypokalemia: - Potassium is normal.  Continue potassium daily.   3.  Normocytic anemia: - Bone marrow infiltration and myelosuppression as etiology.  Hemoglobin today is 6.8.  We will arrange for 2 units of PRBC tomorrow.   4.  ID prophylaxis: - Continue acyclovir twice daily.  Continue to hold aspirin due to thrombocytopenia.   5.  Myeloma bone disease: - Calcium is normal.  Continue denosumab monthly.   Orders placed this encounter:  No orders of the defined types were placed in this encounter.    Derek Jack, MD [place] [phone]

## 2022-02-03 NOTE — Progress Notes (Signed)
Patient has been examined by Dr. Delton Coombes, and vital signs and labs have been reviewed. ANC, Creatinine, LFTs, hemoglobin, and platelets are within treatment parameters per M.D. - pt may proceed with treatment. Pt to receive 2 units PRBC tomorrow. Primary RN and pharmacy notified.

## 2022-02-03 NOTE — Progress Notes (Signed)
Patient presents today for Daratumumab. Hgb 6.8, per Dr. Delton Coombes patient okay for treatment with additional orders received for 2 units of blood on 11/30.  Patient tolerated Daratumumab injection with no complaints voiced. See MAR for details. Lab reviewed. Injection site clean and dry with no bruising or swelling noted at site. Band aid applied. Vss with discharge and left in satisfactory condition with nos/s of distress noted.

## 2022-02-03 NOTE — Patient Instructions (Signed)
[facility]  Discharge Instructions: Thank you for choosing [facility] to provide your oncology and hematology care.  If you have a lab appointment with the Cancer Center, please come in thru the Main Entrance and check in at the main information desk.  Wear comfortable clothing and clothing appropriate for easy access to any Portacath or PICC line.   We strive to give you quality time with your provider. You may need to reschedule your appointment if you arrive late (15 or more minutes).  Arriving late affects you and other patients whose appointments are after yours.  Also, if you miss three or more appointments without notifying the office, you may be dismissed from the clinic at the provider's discretion.      For prescription refill requests, have your pharmacy contact our office and allow 72 hours for refills to be completed.    Today you received the following chemotherapy and/or immunotherapy agents Daratumumab, return as scheduled.   To help prevent nausea and vomiting after your treatment, we encourage you to take your nausea medication as directed.  BELOW ARE SYMPTOMS THAT SHOULD BE REPORTED IMMEDIATELY: *FEVER GREATER THAN 100.4 F (38 C) OR HIGHER *CHILLS OR SWEATING *NAUSEA AND VOMITING THAT IS NOT CONTROLLED WITH YOUR NAUSEA MEDICATION *UNUSUAL SHORTNESS OF BREATH *UNUSUAL BRUISING OR BLEEDING *URINARY PROBLEMS (pain or burning when urinating, or frequent urination) *BOWEL PROBLEMS (unusual diarrhea, constipation, pain near the anus) TENDERNESS IN MOUTH AND THROAT WITH OR WITHOUT PRESENCE OF ULCERS (sore throat, sores in mouth, or a toothache) UNUSUAL RASH, SWELLING OR PAIN  UNUSUAL VAGINAL DISCHARGE OR ITCHING   Items with * indicate a potential emergency and should be followed up as soon as possible or go to the Emergency Department if any problems should occur.  Please show the CHEMOTHERAPY ALERT CARD or IMMUNOTHERAPY ALERT CARD at  check-in to the Emergency Department and triage nurse.  Should you have questions after your visit or need to cancel or reschedule your appointment, please contact [facility] [phone]  and follow the prompts.  Office hours are 8:00 a.m. to 4:30 p.m. Monday - Friday. Please note that voicemails left after 4:00 p.m. may not be returned until the following business day.  We are closed weekends and major holidays. You have access to a nurse at all times for urgent questions. Please call the main number to the clinic [phone] and follow the prompts.  For any non-urgent questions, you may also contact your provider using MyChart. We now offer e-Visits for anyone 18 and older to request care online for non-urgent symptoms. For details visit [email or web address].   Also download the MyChart app! Go to the app store, search "MyChart", open the app, select [facility], and log in with your MyChart username and password.  Masks are optional in the cancer centers. If you would like for your care team to wear a mask while they are taking care of you, please let them know. You may have one support person who is at least 80 years old accompany you for your appointments.  

## 2022-02-04 ENCOUNTER — Inpatient Hospital Stay: Payer: Medicare Other

## 2022-02-04 ENCOUNTER — Other Ambulatory Visit: Payer: Self-pay

## 2022-02-04 DIAGNOSIS — Z5112 Encounter for antineoplastic immunotherapy: Secondary | ICD-10-CM | POA: Diagnosis not present

## 2022-02-04 DIAGNOSIS — C9000 Multiple myeloma not having achieved remission: Secondary | ICD-10-CM

## 2022-02-04 MED ORDER — SODIUM CHLORIDE 0.9% IV SOLUTION
250.0000 mL | Freq: Once | INTRAVENOUS | Status: AC
  Administered 2022-02-04: 250 mL via INTRAVENOUS

## 2022-02-04 NOTE — Progress Notes (Signed)
UNMATCHED BLOOD PRODUCT NOTE  Compare the patient ID on the blood tag to the patient ID on the hospital armband and Blood Bank armband. Then confirm the unit number on the blood tag matches the unit number on the blood product.  If a discrepancy is discovered return the product to blood bank immediately.   Blood Product Type: Packed Red Blood Cells  Unit #: (Found on blood product bag, begins with W) [id number]  Product Code #: (Found on blood product bag, begins with E) [id number]   Start Time: 1225  Starting Rate: 120 ml/hr  Rate increase/decreased  (if applicable):      ml/hr  Rate changed time (if applicable): 0867 - 619JK/DT   Stop Time: 2671   All Other Documentation should be documented within the Blood Admin Flowsheet per policy.

## 2022-02-04 NOTE — Patient Instructions (Signed)
[facility]  Discharge Instructions: Thank you for choosing [facility] to provide your oncology and hematology care.  If you have a lab appointment with the Cancer Center, please come in thru the Main Entrance and check in at the main information desk.  Wear comfortable clothing and clothing appropriate for easy access to any Portacath or PICC line.   We strive to give you quality time with your provider. You may need to reschedule your appointment if you arrive late (15 or more minutes).  Arriving late affects you and other patients whose appointments are after yours.  Also, if you miss three or more appointments without notifying the office, you may be dismissed from the clinic at the provider's discretion.      For prescription refill requests, have your pharmacy contact our office and allow 72 hours for refills to be completed.     To help prevent nausea and vomiting after your treatment, we encourage you to take your nausea medication as directed.  BELOW ARE SYMPTOMS THAT SHOULD BE REPORTED IMMEDIATELY: *FEVER GREATER THAN 100.4 F (38 C) OR HIGHER *CHILLS OR SWEATING *NAUSEA AND VOMITING THAT IS NOT CONTROLLED WITH YOUR NAUSEA MEDICATION *UNUSUAL SHORTNESS OF BREATH *UNUSUAL BRUISING OR BLEEDING *URINARY PROBLEMS (pain or burning when urinating, or frequent urination) *BOWEL PROBLEMS (unusual diarrhea, constipation, pain near the anus) TENDERNESS IN MOUTH AND THROAT WITH OR WITHOUT PRESENCE OF ULCERS (sore throat, sores in mouth, or a toothache) UNUSUAL RASH, SWELLING OR PAIN  UNUSUAL VAGINAL DISCHARGE OR ITCHING   Items with * indicate a potential emergency and should be followed up as soon as possible or go to the Emergency Department if any problems should occur.  Please show the CHEMOTHERAPY ALERT CARD or IMMUNOTHERAPY ALERT CARD at check-in to the Emergency Department and triage nurse.  Should you have questions after your visit or need to  cancel or reschedule your appointment, please contact [facility] [phone]  and follow the prompts.  Office hours are 8:00 a.m. to 4:30 p.m. Monday - Friday. Please note that voicemails left after 4:00 p.m. may not be returned until the following business day.  We are closed weekends and major holidays. You have access to a nurse at all times for urgent questions. Please call the main number to the clinic [phone] and follow the prompts.  For any non-urgent questions, you may also contact your provider using MyChart. We now offer e-Visits for anyone 18 and older to request care online for non-urgent symptoms. For details visit [email or web address].   Also download the MyChart app! Go to the app store, search "MyChart", open the app, select [facility], and log in with your MyChart username and password.  Masks are optional in the cancer centers. If you would like for your care team to wear a mask while they are taking care of you, please let them know. You may have one support person who is at least 80 years old accompany you for your appointments.  

## 2022-02-04 NOTE — Progress Notes (Signed)
Patient presents today for two unit of PRBC.  Patient is in satisfactory condition with no new complaints voiced.  Vital signs are stable.  Tylenol and Benadryl were taken at 0830 at home prior to visit.  IV site clean and dry with good blood return noted.  We will proceed with transfusion per provider orders.   Patient tolerated transfusions well with no complaints voiced.  Patient left via wheelchair with daughter in stable condition.  Vital signs stable at discharge.  Follow up as scheduled.

## 2022-02-05 LAB — BPAM RBC
Blood Product Expiration Date: 202312212359
Blood Product Expiration Date: 202312282359
ISSUE DATE / TIME: 202311301013
ISSUE DATE / TIME: 202311301209
Unit Type and Rh: 5100
Unit Type and Rh: 5100

## 2022-02-05 LAB — TYPE AND SCREEN
ABO/RH(D): O POS
Antibody Screen: POSITIVE
Donor AG Type: NEGATIVE
Donor AG Type: NEGATIVE
Unit division: 0
Unit division: 0

## 2022-02-10 ENCOUNTER — Inpatient Hospital Stay: Payer: Medicare Other | Attending: Hematology

## 2022-02-10 DIAGNOSIS — Z79899 Other long term (current) drug therapy: Secondary | ICD-10-CM | POA: Diagnosis not present

## 2022-02-10 DIAGNOSIS — Z5112 Encounter for antineoplastic immunotherapy: Secondary | ICD-10-CM | POA: Diagnosis present

## 2022-02-10 DIAGNOSIS — D649 Anemia, unspecified: Secondary | ICD-10-CM | POA: Diagnosis not present

## 2022-02-10 DIAGNOSIS — C9000 Multiple myeloma not having achieved remission: Secondary | ICD-10-CM

## 2022-02-10 DIAGNOSIS — C9002 Multiple myeloma in relapse: Secondary | ICD-10-CM | POA: Insufficient documentation

## 2022-02-10 LAB — CBC WITH DIFFERENTIAL/PLATELET
Abs Immature Granulocytes: 0 10*3/uL (ref 0.00–0.07)
Basophils Absolute: 0 10*3/uL (ref 0.0–0.1)
Basophils Relative: 0 %
Eosinophils Absolute: 0 10*3/uL (ref 0.0–0.5)
Eosinophils Relative: 0 %
HCT: 31.7 % — ABNORMAL LOW (ref 36.0–46.0)
Hemoglobin: 10.6 g/dL — ABNORMAL LOW (ref 12.0–15.0)
Lymphocytes Relative: 47 %
Lymphs Abs: 0.5 10*3/uL — ABNORMAL LOW (ref 0.7–4.0)
MCH: 31.2 pg (ref 26.0–34.0)
MCHC: 33.4 g/dL (ref 30.0–36.0)
MCV: 93.2 fL (ref 80.0–100.0)
Metamyelocytes Relative: 1 %
Monocytes Absolute: 0.1 10*3/uL (ref 0.1–1.0)
Monocytes Relative: 14 %
Neutro Abs: 0.4 10*3/uL — CL (ref 1.7–7.7)
Neutrophils Relative %: 38 %
Platelets: 26 10*3/uL — CL (ref 150–400)
RBC: 3.4 MIL/uL — ABNORMAL LOW (ref 3.87–5.11)
RDW: 15.1 % (ref 11.5–15.5)
WBC: 1 10*3/uL — CL (ref 4.0–10.5)
nRBC: 0 % (ref 0.0–0.2)

## 2022-02-10 LAB — COMPREHENSIVE METABOLIC PANEL
ALT: 21 U/L (ref 0–44)
AST: 19 U/L (ref 15–41)
Albumin: 3.1 g/dL — ABNORMAL LOW (ref 3.5–5.0)
Alkaline Phosphatase: 110 U/L (ref 38–126)
Anion gap: 13 (ref 5–15)
BUN: 41 mg/dL — ABNORMAL HIGH (ref 8–23)
CO2: 27 mmol/L (ref 22–32)
Calcium: 7.7 mg/dL — ABNORMAL LOW (ref 8.9–10.3)
Chloride: 97 mmol/L — ABNORMAL LOW (ref 98–111)
Creatinine, Ser: 1.42 mg/dL — ABNORMAL HIGH (ref 0.44–1.00)
GFR, Estimated: 37 mL/min — ABNORMAL LOW (ref >60)
Glucose, Bld: 150 mg/dL — ABNORMAL HIGH (ref 70–99)
Potassium: 3.8 mmol/L (ref 3.5–5.1)
Sodium: 137 mmol/L (ref 135–145)
Total Bilirubin: 0.4 mg/dL (ref 0.3–1.2)
Total Protein: 6.6 g/dL (ref 6.5–8.1)

## 2022-02-10 LAB — SAMPLE TO BLOOD BANK

## 2022-02-10 LAB — MAGNESIUM: Magnesium: 2.3 mg/dL (ref 1.7–2.4)

## 2022-02-10 NOTE — Progress Notes (Unsigned)
CRITICAL VALUE STICKER  CRITICAL VALUE: WBC 1.0, Platelets 26, ANC 0.3  RECEIVER (on-site recipient of call): Ronne Binning RN  DATE & TIME NOTIFIED: [phone] 02/10/22  MESSENGER (representative from lab): Chelsey  MD NOTIFIED: Delton Coombes  TIME OF NOTIFICATION:1328   RESPONSE:  1 Unit platelets

## 2022-02-10 NOTE — Progress Notes (Signed)
CRITICAL VALUE ALERT Critical value received:  ANC 0.4 Date of notification:  02-10-22 Time of notification: 1173 Critical value read back:  Yes.   Nurse who received alert:  C. Laniece Hornbaker RN MD notified time and response:  5670, Dr. Delton Coombes , no change

## 2022-02-12 ENCOUNTER — Inpatient Hospital Stay: Payer: Medicare Other

## 2022-02-12 VITALS — BP 94/58 | HR 98 | Temp 97.5°F | Resp 18

## 2022-02-12 DIAGNOSIS — Z5112 Encounter for antineoplastic immunotherapy: Secondary | ICD-10-CM | POA: Diagnosis not present

## 2022-02-12 DIAGNOSIS — C9000 Multiple myeloma not having achieved remission: Secondary | ICD-10-CM

## 2022-02-12 MED ORDER — SODIUM CHLORIDE 0.9% IV SOLUTION
250.0000 mL | Freq: Once | INTRAVENOUS | Status: AC
  Administered 2022-02-12: 250 mL via INTRAVENOUS

## 2022-02-12 NOTE — Patient Instructions (Signed)
[facility]  Discharge Instructions: Thank you for choosing [facility] to provide your oncology and hematology care.  If you have a lab appointment with the [facility], please come in thru the Main Entrance and check in at the main information desk.  Wear comfortable clothing and clothing appropriate for easy access to any Portacath or PICC line.   We strive to give you quality time with your provider. You may need to reschedule your appointment if you arrive late (15 or more minutes).  Arriving late affects you and other patients whose appointments are after yours.  Also, if you miss three or more appointments without notifying the office, you may be dismissed from the clinic at the provider's discretion.      For prescription refill requests, have your pharmacy contact our office and allow 72 hours for refills to be completed.    Today you received the following chemotherapy and/or immunotherapy agents 1 unit of platelets.       To help prevent nausea and vomiting after your treatment, we encourage you to take your nausea medication as directed.  BELOW ARE SYMPTOMS THAT SHOULD BE REPORTED IMMEDIATELY: *FEVER GREATER THAN 100.4 F (38 C) OR HIGHER *CHILLS OR SWEATING *NAUSEA AND VOMITING THAT IS NOT CONTROLLED WITH YOUR NAUSEA MEDICATION *UNUSUAL SHORTNESS OF BREATH *UNUSUAL BRUISING OR BLEEDING *URINARY PROBLEMS (pain or burning when urinating, or frequent urination) *BOWEL PROBLEMS (unusual diarrhea, constipation, pain near the anus) TENDERNESS IN MOUTH AND THROAT WITH OR WITHOUT PRESENCE OF ULCERS (sore throat, sores in mouth, or a toothache) UNUSUAL RASH, SWELLING OR PAIN  UNUSUAL VAGINAL DISCHARGE OR ITCHING   Items with * indicate a potential emergency and should be followed up as soon as possible or go to the Emergency Department if any problems should occur.  Please show the CHEMOTHERAPY ALERT CARD or IMMUNOTHERAPY ALERT CARD at check-in  to the Emergency Department and triage nurse.  Should you have questions after your visit or need to cancel or reschedule your appointment, please contact [facility] [phone]  and follow the prompts.  Office hours are 8:00 a.m. to 4:30 p.m. Monday - Friday. Please note that voicemails left after 4:00 p.m. may not be returned until the following business day.  We are closed weekends and major holidays. You have access to a nurse at all times for urgent questions. Please call the main number to the clinic [phone] and follow the prompts.  For any non-urgent questions, you may also contact your provider using MyChart. We now offer e-Visits for anyone 59 and older to request care online for non-urgent symptoms. For details visit [email or web address].   Also download the MyChart app! Go to the app store, search "MyChart", open the app, select [facility], and log in with your MyChart username and password.  Masks are optional in the cancer centers. If you would like for your care team to wear a mask while they are taking care of you, please let them know. You may have one support person who is at least 80 years old accompany you for your appointments.

## 2022-02-12 NOTE — Progress Notes (Signed)
Patient presents today for 1 unit of platelets. Vital signs stable. Patient has no complaints today. Patient denies any bleeding from nose or gums. MAR reviewed and updated.   1 unit of platelets given today per MD orders. Tolerated infusion without adverse affects. Vital signs stable. No complaints at this time. Discharged from clinic by wheel chair and accompanied by daughter in stable condition. Alert and oriented x 3. F/U with [facility] as scheduled.

## 2022-02-13 LAB — BPAM PLATELET PHERESIS
Blood Product Expiration Date: 202312102359
ISSUE DATE / TIME: 202312080950
Unit Type and Rh: 5100

## 2022-02-13 LAB — PREPARE PLATELET PHERESIS: Unit division: 0

## 2022-02-14 LAB — TYPE AND SCREEN
ABO/RH(D): O POS
Antibody Screen: POSITIVE
Donor AG Type: NEGATIVE
Donor AG Type: NEGATIVE
Unit division: 0
Unit division: 0

## 2022-02-14 LAB — BPAM RBC
Blood Product Expiration Date: 202401052359
Unit Type and Rh: 5100

## 2022-02-16 ENCOUNTER — Telehealth: Payer: Self-pay | Admitting: Internal Medicine

## 2022-02-16 MED ORDER — LEVOTHYROXINE SODIUM 137 MCG PO TABS: 137.0000 ug | ORAL_TABLET | Freq: Every day | ORAL | 0 refills | Status: AC

## 2022-02-16 NOTE — Telephone Encounter (Signed)
MEDICATION: Levothyroxine  PHARMACY:  [facility] CONTACTED THEIR PHARMACY?  yes  IS THIS A 90 DAY SUPPLY : unknown  IS PATIENT OUT OF MEDICATION:   IF NOT; HOW MUCH IS LEFT:   LAST APPOINTMENT DATE: would have been 02/17/22 with Dr Loanne Drilling  NEXT APPOINTMENT DATE:'@2'$ /16/2024 with Dr Kelton Pillar  DO WE HAVE YOUR PERMISSION TO LEAVE A DETAILED MESSAGE?: YES  OTHER COMMENTS: - Daughter is also asking if any thyroid labs that may be needed be ordered so that when patient has lab draw tomorrow at [facility] they can use that one draw

## 2022-02-16 NOTE — Telephone Encounter (Signed)
done

## 2022-02-17 ENCOUNTER — Encounter: Payer: Self-pay | Admitting: Hematology

## 2022-02-17 ENCOUNTER — Other Ambulatory Visit: Payer: Self-pay

## 2022-02-17 ENCOUNTER — Inpatient Hospital Stay: Payer: Medicare Other

## 2022-02-17 ENCOUNTER — Ambulatory Visit: Payer: Medicare Other

## 2022-02-17 ENCOUNTER — Ambulatory Visit: Payer: Medicare Other | Admitting: Endocrinology

## 2022-02-17 ENCOUNTER — Other Ambulatory Visit: Payer: Medicare Other

## 2022-02-17 ENCOUNTER — Inpatient Hospital Stay (HOSPITAL_BASED_OUTPATIENT_CLINIC_OR_DEPARTMENT_OTHER): Payer: Medicare Other | Admitting: Hematology

## 2022-02-17 VITALS — BP 116/63 | HR 94 | Temp 97.6°F | Resp 18

## 2022-02-17 DIAGNOSIS — C9000 Multiple myeloma not having achieved remission: Secondary | ICD-10-CM

## 2022-02-17 DIAGNOSIS — Z5112 Encounter for antineoplastic immunotherapy: Secondary | ICD-10-CM | POA: Diagnosis not present

## 2022-02-17 DIAGNOSIS — D649 Anemia, unspecified: Secondary | ICD-10-CM

## 2022-02-17 LAB — COMPREHENSIVE METABOLIC PANEL
ALT: 20 U/L (ref 0–44)
AST: 17 U/L (ref 15–41)
Albumin: 2.9 g/dL — ABNORMAL LOW (ref 3.5–5.0)
Alkaline Phosphatase: 94 U/L (ref 38–126)
Anion gap: 14 (ref 5–15)
BUN: 48 mg/dL — ABNORMAL HIGH (ref 8–23)
CO2: 27 mmol/L (ref 22–32)
Calcium: 8.6 mg/dL — ABNORMAL LOW (ref 8.9–10.3)
Chloride: 97 mmol/L — ABNORMAL LOW (ref 98–111)
Creatinine, Ser: 1.15 mg/dL — ABNORMAL HIGH (ref 0.44–1.00)
GFR, Estimated: 48 mL/min — ABNORMAL LOW (ref >60)
Glucose, Bld: 129 mg/dL — ABNORMAL HIGH (ref 70–99)
Potassium: 3 mmol/L — ABNORMAL LOW (ref 3.5–5.1)
Sodium: 138 mmol/L (ref 135–145)
Total Bilirubin: 0.6 mg/dL (ref 0.3–1.2)
Total Protein: 6.2 g/dL — ABNORMAL LOW (ref 6.5–8.1)

## 2022-02-17 LAB — SAMPLE TO BLOOD BANK

## 2022-02-17 LAB — CBC WITH DIFFERENTIAL/PLATELET
Abs Immature Granulocytes: 0 10*3/uL (ref 0.00–0.07)
Basophils Absolute: 0 10*3/uL (ref 0.0–0.1)
Basophils Relative: 0 %
Eosinophils Absolute: 0 10*3/uL (ref 0.0–0.5)
Eosinophils Relative: 2 %
HCT: 25.2 % — ABNORMAL LOW (ref 36.0–46.0)
Hemoglobin: 8.4 g/dL — ABNORMAL LOW (ref 12.0–15.0)
Lymphocytes Relative: 56 %
Lymphs Abs: 0.5 10*3/uL — ABNORMAL LOW (ref 0.7–4.0)
MCH: 31.1 pg (ref 26.0–34.0)
MCHC: 33.3 g/dL (ref 30.0–36.0)
MCV: 93.3 fL (ref 80.0–100.0)
Monocytes Absolute: 0 10*3/uL — ABNORMAL LOW (ref 0.1–1.0)
Monocytes Relative: 4 %
Neutro Abs: 0.3 10*3/uL — CL (ref 1.7–7.7)
Neutrophils Relative %: 38 %
Platelets: 29 10*3/uL — CL (ref 150–400)
RBC: 2.7 MIL/uL — ABNORMAL LOW (ref 3.87–5.11)
RDW: 15.2 % (ref 11.5–15.5)
WBC: 0.9 10*3/uL — CL (ref 4.0–10.5)
nRBC: 0 % (ref 0.0–0.2)

## 2022-02-17 LAB — MAGNESIUM: Magnesium: 2.2 mg/dL (ref 1.7–2.4)

## 2022-02-17 MED ORDER — POMALIDOMIDE 2 MG PO CAPS: 2.0000 mg | ORAL_CAPSULE | Freq: Every day | ORAL | 0 refills | Status: DC

## 2022-02-17 MED ORDER — METHYLPHENIDATE HCL 5 MG PO TABS: 5.0000 mg | ORAL_TABLET | Freq: Every day | ORAL | 0 refills | Status: DC | PRN

## 2022-02-17 MED ORDER — DARATUMUMAB-HYALURONIDASE-FIHJ 1800-30000 MG-UT/15ML ~~LOC~~ SOLN
1800.0000 mg | Freq: Once | SUBCUTANEOUS | Status: AC
  Administered 2022-02-17: 1800 mg via SUBCUTANEOUS
  Filled 2022-02-17: qty 15

## 2022-02-17 MED ORDER — POTASSIUM CHLORIDE CRYS ER 20 MEQ PO TBCR
40.0000 meq | EXTENDED_RELEASE_TABLET | Freq: Once | ORAL | Status: AC
  Administered 2022-02-17: 40 meq via ORAL
  Filled 2022-02-17: qty 2

## 2022-02-17 MED ORDER — POMALIDOMIDE 3 MG PO CAPS: 3.0000 mg | ORAL_CAPSULE | Freq: Every day | ORAL | 0 refills | Status: DC

## 2022-02-17 NOTE — Patient Instructions (Signed)
[facility]  Discharge Instructions: Thank you for choosing [facility] to provide your oncology and hematology care.  If you have a lab appointment with the [facility], please come in thru the Main Entrance and check in at the main information desk.  Wear comfortable clothing and clothing appropriate for easy access to any Portacath or PICC line.   We strive to give you quality time with your provider. You may need to reschedule your appointment if you arrive late (15 or more minutes).  Arriving late affects you and other patients whose appointments are after yours.  Also, if you miss three or more appointments without notifying the office, you may be dismissed from the clinic at the provider's discretion.      For prescription refill requests, have your pharmacy contact our office and allow 72 hours for refills to be completed.    Today you received the following chemotherapy and/or immunotherapy agents Daratumumab      To help prevent nausea and vomiting after your treatment, we encourage you to take your nausea medication as directed.  BELOW ARE SYMPTOMS THAT SHOULD BE REPORTED IMMEDIATELY: *FEVER GREATER THAN 100.4 F (38 C) OR HIGHER *CHILLS OR SWEATING *NAUSEA AND VOMITING THAT IS NOT CONTROLLED WITH YOUR NAUSEA MEDICATION *UNUSUAL SHORTNESS OF BREATH *UNUSUAL BRUISING OR BLEEDING *URINARY PROBLEMS (pain or burning when urinating, or frequent urination) *BOWEL PROBLEMS (unusual diarrhea, constipation, pain near the anus) TENDERNESS IN MOUTH AND THROAT WITH OR WITHOUT PRESENCE OF ULCERS (sore throat, sores in mouth, or a toothache) UNUSUAL RASH, SWELLING OR PAIN  UNUSUAL VAGINAL DISCHARGE OR ITCHING   Items with * indicate a potential emergency and should be followed up as soon as possible or go to the Emergency Department if any problems should occur.  Please show the CHEMOTHERAPY ALERT CARD or IMMUNOTHERAPY ALERT CARD at check-in to the  Emergency Department and triage nurse.  Should you have questions after your visit or need to cancel or reschedule your appointment, please contact [facility] [phone]  and follow the prompts.  Office hours are 8:00 a.m. to 4:30 p.m. Monday - Friday. Please note that voicemails left after 4:00 p.m. may not be returned until the following business day.  We are closed weekends and major holidays. You have access to a nurse at all times for urgent questions. Please call the main number to the clinic [phone] and follow the prompts.  For any non-urgent questions, you may also contact your provider using MyChart. We now offer e-Visits for anyone 26 and older to request care online for non-urgent symptoms. For details visit [email or web address].   Also download the MyChart app! Go to the app store, search "MyChart", open the app, select [facility], and log in with your MyChart username and password.  Masks are optional in the cancer centers. If you would like for your care team to wear a mask while they are taking care of you, please let them know. You may have one support person who is at least 80 years old accompany you for your appointments.

## 2022-02-17 NOTE — Telephone Encounter (Signed)
Chart reviewed. Pomalyst refilled per last office note with Dr. Katragadda.  

## 2022-02-17 NOTE — Patient Instructions (Addendum)
[facility] at [facility] Discharge Instructions   You were seen and examined today by Dr. Delton Coombes.  He reviewed the results of your blood work. Your potassium is low at 3.0. We will give you potassium pills in the clinic today. Your hemoglobin is 8.4. Platelet count is 29,000. You absolute neutrophil count (white blood cells that fight infection) is very low at 300.   We will proceed with your treatment today.   We will increase the dose of your Pomalyst to 3 mg three weeks on and one week off.   Return as scheduled.      Thank you for choosing [facility] at [facility] to provide your oncology and hematology care.  To afford each patient quality time with our provider, please arrive at least 15 minutes before your scheduled appointment time.   If you have a lab appointment with the [facility] please come in thru the Main Entrance and check in at the main information desk.  You need to re-schedule your appointment should you arrive 10 or more minutes late.  We strive to give you quality time with our providers, and arriving late affects you and other patients whose appointments are after yours.  Also, if you no show three or more times for appointments you may be dismissed from the clinic at the providers discretion.     Again, thank you for choosing [facility].  Our hope is that these requests will decrease the amount of time that you wait before being seen by our physicians.       _____________________________________________________________  Should you have questions after your visit to [facility], please contact our office at [phone] and follow the prompts.  Our office hours are 8:00 a.m. and 4:30 p.m. Monday - Friday.  Please note that voicemails left after 4:00 p.m. may not be returned until the following business day.  We are closed weekends and major holidays.  You do have access to a nurse 24-7,  just call the main number to the clinic [phone] and do not press any options, hold on the line and a nurse will answer the phone.    For prescription refill requests, have your pharmacy contact our office and allow 72 hours.    Due to Covid, you will need to wear a mask upon entering the hospital. If you do not have a mask, a mask will be given to you at the Main Entrance upon arrival. For doctor visits, patients may have 1 support person age 33 or older with them. For treatment visits, patients can not have anyone with them due to social distancing guidelines and our immunocompromised population.

## 2022-02-17 NOTE — Progress Notes (Signed)
Patient presents today for Daratumumab injection per providers order.  Vital signs within parameters for treatment.  ANC noted to be 0.4 and platelets 29, MD aware.  Message received from Anastasio Champion RN/Dr. Delton Coombes patient okay for treatment and will receive one Unit of platelets tomorrow.  Stable during administration without incident; injection site WNL; see MAR for injection details.  Patient tolerated procedure well and without incident.  No questions or complaints noted at this time.

## 2022-02-17 NOTE — Progress Notes (Signed)
[facility] [place], [place] [address]   CLINIC:  Medical Oncology/Hematology  PCP:  [name], MD [address] 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  PRIOR THERAPY: Velcade x 9 cycles from 07/04/2019 to 01/10/2020  CURRENT THERAPY: Pomalidomide, daratumumab and dexamethasone  BRIEF ONCOLOGIC HISTORY:  Oncology History  Multiple myeloma not having achieved remission ([place])  06/07/2019 Initial Diagnosis   Multiple myeloma not having achieved remission ([place])   07/04/2019 - 01/10/2020 Chemotherapy   The patient had dexamethasone (DECADRON) 4 MG tablet, 1 of 1 cycle, Start date: 01/10/2020, End date: -- lenalidomide (REVLIMID) 15 MG capsule, 1 of 1 cycle, Start date: 02/26/2020, End date: -- bortezomib SQ (VELCADE) chemo injection 2.25 mg, 1.3 mg/m2 = 2.25 mg, Subcutaneous,  Once, 9 of 10 cycles Administration: 2.25 mg (07/04/2019), 2.25 mg (07/11/2019), 2.25 mg (07/26/2019), 2.25 mg (08/02/2019), 2.25 mg (08/09/2019), 2.25 mg (08/16/2019), 2.25 mg (08/23/2019), 2.25 mg (08/30/2019), 2.25 mg (09/06/2019), 2.25 mg (09/13/2019), 2.25 mg (09/20/2019), 2.25 mg (09/27/2019), 2.25 mg (10/04/2019), 2.25 mg (10/11/2019), 2.25 mg (10/18/2019), 2.25 mg (10/25/2019), 2.25 mg (11/01/2019), 2.25 mg (11/08/2019), 2.25 mg (11/15/2019), 2.25 mg (11/22/2019), 2.25 mg (11/29/2019), 2.25 mg (12/06/2019), 2.25 mg (12/13/2019), 2.25 mg (12/20/2019), 2.25 mg (12/27/2019), 2.25 mg (01/03/2020), 2.25 mg (01/10/2020)  for chemotherapy treatment.    09/15/2021 - 11/04/2021 Chemotherapy   Patient is on Treatment Plan : MYELOMA RELAPSED REFRACTORY Daratumumab SQ + Pomalidomide + Dexamethasone (DaraPd) q28d     09/15/2021 -  Chemotherapy   Patient is on Treatment Plan : MYELOMA RELAPSED REFRACTORY Daratumumab SQ + Pomalidomide + Dexamethasone (DaraPd) q28d       CANCER STAGING:  Cancer Staging  No matching staging information was found for the  patient.  INTERVAL HISTORY:  Ms. DANDRIA GRIEGO, a 81 y.o. female, seen for follow-up of multiple myeloma.  Since the last visit in 2 weeks, she did not have any major nosebleeds.  She had a small nosebleed this week.  She is taking 2 and half tablets of Decadron on Tuesday and Wednesday.  She is very active on the days she takes Decadron and does not have any energy to do anything on other days.  Some days are worse than others.  Denies any other bleeding issues.   REVIEW OF SYSTEMS:  Review of Systems  Constitutional:  Negative for appetite change and fatigue.  HENT:   Negative for nosebleeds.   Respiratory:  Negative for cough.   Gastrointestinal:  Negative for blood in stool.  All other systems reviewed and are negative.   PAST MEDICAL/SURGICAL HISTORY:  Past Medical History:  Diagnosis Date   Atrial fibrillation ([place]) 2011   Postop, spontaneous conversion to normal sinus after one hour   CAP (community acquired pneumonia) 09/23/2021   Cholecystitis, acute 04/25/2015   Compression fracture 07/24/2009   T12; kyphoplasty   History of echocardiogram 07/2009   EF 65%   Hypertension    Thyroid disease    Tobacco abuse    Past Surgical History:  Procedure Laterality Date   BACK SURGERY     BACK SURGERY  06/06/2015   BREAST EXCISIONAL BIOPSY Left    50 years ago  benign   CHOLECYSTECTOMY N/A 04/25/2015   Procedure: LAPAROSCOPIC CHOLECYSTECTOMY WITH INTRAOPERATIVE CHOLANGIOGRAM;  Surgeon: Mickeal Skinner, MD;  Location: [facility];  Service: General;  Laterality: N/A;   COLONOSCOPY N/A 11/26/2015   Procedure: COLONOSCOPY;  Surgeon: Daneil Dolin, MD;  Location[facility];  Service: Endoscopy;  Laterality: N/A;  7:30 am   ERCP N/A 04/14/2015   Procedure: ENDOSCOPIC RETROGRADE CHOLANGIOPANCREATOGRAPHY (ERCP) Biliary Sphincterotomy, 10x7 stent placement Dilated bilary system just not well seen;  Surgeon: Rogene Houston, MD;  Location: [facility];  Service: Endoscopy;  Laterality:  N/A;   ERCP N/A 06/12/2015   Procedure: ENDOSCOPIC RETROGRADE CHOLANGIOPANCREATOGRAPHY (ERCP);  Surgeon: Rogene Houston, MD;  [facility];  Service: Endoscopy;  Laterality: N/A;   ESOPHAGOGASTRODUODENOSCOPY N/A 06/12/2015   Procedure: DIAGNOSTIC ESOPHAGOGASTRODUODENOSCOPY (EGD);  Surgeon: Rogene Houston, MD;  [facility];  Service: Endoscopy;  Laterality: N/A;   FEMUR IM NAIL Right 05/11/2019   Procedure: RETROGRADE INTRAMEDULLARY NAIL FEMORAL;  Surgeon: Shona Needles, MD;  Location: [facility];  Service: Orthopedics;  Laterality: Right;   STENT REMOVAL  06/12/2015   Procedure: STENT REMOVAL ;  Surgeon: Rogene Houston, MD;  [facility];  Service: Endoscopy;;    SOCIAL HISTORY:  Social History   Socioeconomic History   Marital status: Widowed    Spouse name: Not on file   Number of children: 2   Years of education: Not on file   Highest education level: Not on file  Occupational History   Occupation: retired  Tobacco Use   Smoking status: Former    Packs/day: 0.15    Years: 20.00    Total pack years: 3.00    Types: Cigarettes    Quit date: 03/08/2013    Years since quitting: 8.9   Smokeless tobacco: Never  Vaping Use   Vaping Use: Never used  Substance and Sexual Activity   Alcohol use: No    Alcohol/week: 0.0 standard drinks of alcohol   Drug use: No   Sexual activity: Not Currently  Other Topics Concern   Not on file  Social History Narrative   Active in gardens and does yard work.   Social Determinants of Health   Financial Resource Strain: Low Risk  (05/08/2019)   Overall Financial Resource Strain (CARDIA)    Difficulty of Paying Living Expenses: Not hard at all  Food Insecurity: No Food Insecurity (01/14/2022)   Hunger Vital Sign    Worried About Running Out of Food in the Last Year: Never true    Ran Out of Food in the Last Year: Never true  Transportation Needs: No Transportation Needs (01/14/2022)   PRAPARE - Radiographer, therapeutic (Medical): No    Lack of Transportation (Non-Medical): No  Physical Activity: Inactive (05/08/2019)   Exercise Vital Sign    Days of Exercise per Week: 0 days    Minutes of Exercise per Session: 0 min  Stress: No Stress Concern Present (05/08/2019)   [place]    Feeling of Stress : Not at all  Social Connections: Moderately Isolated (02/06/2020)   Social Connection and Isolation Panel [NHANES]    Frequency of Communication with Friends and Family: More than three times a week    Frequency of Social Gatherings with Friends and Family: More than three times a week    Attends Religious Services: More than 4 times per year    Active Member of [employer] or Organizations: No    Attends Archivist Meetings: Never    Marital Status: Widowed  Intimate Partner Violence: Not At Risk (01/14/2022)   Humiliation, Afraid, Rape, and Kick questionnaire  Fear of Current or Ex-Partner: No    Emotionally Abused: No    Physically Abused: No    Sexually Abused: No    FAMILY HISTORY:  Family History  Problem Relation Age of Onset   Heart attack Father    Stroke Father    Breast cancer Sister    Thyroid disease Neg Hx    Colon cancer Neg Hx     CURRENT MEDICATIONS:  Current Outpatient Medications  Medication Sig Dispense Refill   acyclovir (ZOVIRAX) 400 MG tablet TAKE (1) TABLET BY MOUTH TWICE DAILY. (Patient taking differently: Take 400 mg by mouth 2 (two) times daily.) 60 tablet 6   CALCIUM PO Take 1 capsule by mouth daily.     carboxymethylcellulose (REFRESH PLUS) 0.5 % SOLN Place 1 drop into both eyes at bedtime.     Cholecalciferol (VITAMIN D) 125 MCG (5000 UT) CAPS Take 5,000 Units by mouth daily. 90 capsule 1   Cyanocobalamin (B-12 PO) Take 1 capsule by mouth daily.     dexamethasone (DECADRON) 4 MG tablet Take 2.5 tablets (10 mg total) by mouth 2 (two) times a week. (Patient taking differently: Take 10 mg  by mouth 2 (two) times a week. Takes Tuesday and Wednesday) 20 tablet 4   Ferrous Sulfate (IRON PO) Take 1 capsule by mouth daily.     levothyroxine (SYNTHROID) 137 MCG tablet Take 1 tablet (137 mcg total) by mouth daily before breakfast. 90 tablet 0   montelukast (SINGULAIR) 10 MG tablet Take one tablet by mouth 1 hour prior to Darzalex injection appointments (Patient taking differently: Take one tablet by mouth 1 hour prior to Darzalex injection appointments - given every other Wednesday) 30 tablet 2   Multiple Vitamin (MULTIVITAMIN WITH MINERALS) TABS tablet Take 1 tablet by mouth daily.     ondansetron (ZOFRAN) 8 MG tablet Take 1 tablet (8 mg total) by mouth every 8 (eight) hours as needed for nausea or vomiting. 60 tablet 0   pomalidomide (POMALYST) 2 MG capsule Take 1 capsule (2 mg total) by mouth daily. Take for 21 days, then hold for 7 days. Repeat every 28 days. 21 capsule 0   potassium gluconate 595 (99 K) MG TABS tablet Take 595 mg by mouth daily.     No current facility-administered medications for this visit.   Facility-Administered Medications Ordered in Other Visits  Medication Dose Route Frequency Provider Last Rate Last Admin   0.9 %  sodium chloride infusion (Manually program via Guardrails IV Fluids)  250 mL Intravenous Once Derek Jack, MD       0.9 %  sodium chloride infusion   Intravenous Continuous Derek Jack, MD   Stopped at 09/15/21 1454    ALLERGIES:  No Known Allergies  PHYSICAL EXAM:  Performance status (ECOG): 1 - Symptomatic but completely ambulatory  There were no vitals filed for this visit.   Wt Readings from Last 3 Encounters:  02/03/22 125 lb 6.4 oz (56.9 kg)  01/20/22 132 lb (59.9 kg)  01/15/22 133 lb 6.1 oz (60.5 kg)   Physical Exam Vitals reviewed.  Constitutional:      Appearance: Normal appearance.  Cardiovascular:     Rate and Rhythm: Normal rate and regular rhythm.     Pulses: Normal pulses.     Heart sounds: Normal  heart sounds.  Pulmonary:     Effort: Pulmonary effort is normal.     Breath sounds: Normal breath sounds.  Neurological:     General: No focal deficit present.  Mental Status: She is alert and oriented to person, place, and time.  Psychiatric:        Mood and Affect: Mood normal.        Behavior: Behavior normal.  Blood in the back of the throat.  LABORATORY DATA:  I have reviewed the labs as listed.     Latest Ref Rng & Units 02/10/2022   12:41 PM 02/03/2022   12:04 PM 01/20/2022   11:53 AM  CBC  WBC 4.0 - 10.5 K/uL 1.0  1.8  1.6   Hemoglobin 12.0 - 15.0 g/dL 10.6  6.8  8.8   Hematocrit 36.0 - 46.0 % 31.7  20.9  26.7   Platelets 150 - 400 K/uL 26  52  26       Latest Ref Rng & Units 02/10/2022   12:41 PM 02/03/2022   12:04 PM 01/20/2022   12:30 PM  CMP  Glucose 70 - 99 mg/dL 150  138  169   BUN 8 - 23 mg/dL 41  43  20   Creatinine 0.44 - 1.00 mg/dL 1.42  1.11  0.71   Sodium 135 - 145 mmol/L 137  136  137   Potassium 3.5 - 5.1 mmol/L 3.8  3.5  3.5   Chloride 98 - 111 mmol/L 97  99  101   CO2 22 - 32 mmol/L _0 Calcium 8.9 - 10.3 mg/dL 7.7  8.7  8.4   Total Protein 6.5 - 8.1 g/dL 6.6  6.6  6.3   Total Bilirubin 0.3 - 1.2 mg/dL 0.4  0.6  0.3   Alkaline Phos 38 - 126 U/L 110  116  99   AST 15 - 41 U/L _1 ALT 0 - 44 U/L _2 DIAGNOSTIC IMAGING:  I have independently reviewed the scans and discussed with the patient. No results found.   ASSESSMENT:  1.  IgA kappa plasma cell myeloma: -IgA kappa plasma cell myeloma diagnosed on right sacral bone biopsy on 05/17/2019.  SPEP did not show M spike.  Immunofixation shows IgA kappa.  Kappa light chains 42.8, lambda light chains at 9.9, ratio 4.32.  Beta-2 microglobulin 3.7. -PET scan on 06/04/2019 showed expansile lytic lesion involving the right sacral wing.  Lytic lesion involving T10 vertebral body.  Left seventh rib lesion.  Increased uptake in a pathological fracture involving distal aspect  of the right femur. 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 -RVD started on 06/26/2019.  Revlimid 15 mg 2 weeks on/1 week off. -Velcade discontinued on 01/10/2020. -She is taking Revlimid 15 mg 2 weeks on 1 week off, with dexamethasone 20 mg on weeks of Revlimid and 10 mg while she is off Revlimid.  Revlimid dose further reduced to 10 mg 3 weeks on/1 week off.  She has developed progressive anemia. - BMBX (08/07/2021): 38% plasma cells in the marrow.  Cytogenetics was normal.  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 - DPD regimen started on, single agent Darzalex and dexamethasone started on 09/15/2021.  Daratumumab held after first treatment due to pneumonia. - Pomalidomide 2 mg 3 weeks on/1 week off and dexamethasone 20 mg weekly started on 10/06/2021.  Daratumumab added on 10/21/2021.   PLAN:  1.  Relapsed IgA kappa plasma cell myeloma: - Myeloma panel on 01/20/2022 with M spike 0.7 g  up from 0.4 g.  However free light chain ratio is improved to 11.67 from 12.10.  Kappa light chains 17.5 from 24. - Labs today shows creatinine 1.15 and normal LFTs.  CBC shows platelet count 29 and white count 0.9 with ANC of 0.3. - Will increase Pomalyst to 3 mg 21 days on/7 days off. - She will receive platelet 1 unit tomorrow because of nosebleeds. - RTC 2 weeks for follow-up.  If there is no improvement, will consider repeat bone marrow biopsy.   2.  Hypokalemia: - Potassium is 3.0 today.  Continue potassium supplements at home.   3.  Normocytic anemia: - Bone marrow infiltration and myelosuppression. - Hemoglobin today is 8.4.  Continue CBC weekly and transfuse if less than 8.   4.  ID prophylaxis: - Continue acyclovir twice daily.  Aspirin on hold.   5.  Myeloma bone disease: - Calcium is normal.  Continue denosumab monthly.  6.  Fatigue: - I will give her Ritalin 5 mg in the mornings as needed on the days she feels very  weak.   Orders placed this encounter:  No orders of the defined types were placed in this encounter.    Derek Jack, MD [facility] [phone]

## 2022-02-17 NOTE — Progress Notes (Signed)
The patient has been taking dexamethasone at home prior to daratumumab injections. After discussion with the provider, the offset time of daratumumab was changed to 30 minutes, starting 12*/28/2023, in order to reduce patient wait time. A calendar was not sent to the patient because she has already been taking dexamethasone at home.  Laray Anger, PharmD PGY-2 Pharmacy Resident Hematology/Oncology [phone]  02/17/2022 4:36 PM

## 2022-02-17 NOTE — Progress Notes (Signed)
CRITICAL VALUE ALERT Critical value received:  ANC 0.4, Platelets 29, WBC 0.9.  Date of notification:  02-17-2022 Time of notification: 08:31 am.  Critical value read back:  Yes.   Nurse who received alert:  B. Joleene Burnham RN.  MD notified time and response:  Dr. Delton Coombes / A. Andersonr RN @ 08:35am. Patient has follow up appointment with Dr. Delton Coombes today. Orders received to infuse 1 unit of platelets 02-18-2022.

## 2022-02-17 NOTE — Progress Notes (Signed)
Patient is taking Pomalyst as prescribed.  She has not missed any doses and reports no side effects at this time.    Patient has been examined by Dr. Delton Coombes, and vital signs and labs have been reviewed. ANC, Creatinine, LFTs, hemoglobin, and platelets are within treatment parameters per M.D. - pt may proceed with treatment.  Primary RN and pharmacy notified.

## 2022-02-18 ENCOUNTER — Other Ambulatory Visit: Payer: Self-pay | Admitting: *Deleted

## 2022-02-18 ENCOUNTER — Inpatient Hospital Stay: Payer: Medicare Other

## 2022-02-18 DIAGNOSIS — C9000 Multiple myeloma not having achieved remission: Secondary | ICD-10-CM

## 2022-02-18 DIAGNOSIS — Z5112 Encounter for antineoplastic immunotherapy: Secondary | ICD-10-CM | POA: Diagnosis not present

## 2022-02-18 LAB — KAPPA/LAMBDA LIGHT CHAINS
Kappa free light chain: 31.6 mg/L — ABNORMAL HIGH (ref 3.3–19.4)
Kappa, lambda light chain ratio: 15.05 — ABNORMAL HIGH (ref 0.26–1.65)
Lambda free light chains: 2.1 mg/L — ABNORMAL LOW (ref 5.7–26.3)

## 2022-02-18 MED ORDER — POMALIDOMIDE 3 MG PO CAPS: 3.0000 mg | ORAL_CAPSULE | Freq: Every day | ORAL | 0 refills | Status: DC

## 2022-02-18 MED ORDER — SODIUM CHLORIDE 0.9% IV SOLUTION
250.0000 mL | Freq: Once | INTRAVENOUS | Status: AC
  Administered 2022-02-18: 250 mL via INTRAVENOUS

## 2022-02-18 NOTE — Patient Instructions (Signed)
[facility]  Discharge Instructions: Thank you for choosing [facility] to provide your oncology and hematology care.  If you have a lab appointment with the [facility], please come in thru the Main Entrance and check in at the main information desk.  Wear comfortable clothing and clothing appropriate for easy access to any Portacath or PICC line.   We strive to give you quality time with your provider. You may need to reschedule your appointment if you arrive late (15 or more minutes).  Arriving late affects you and other patients whose appointments are after yours.  Also, if you miss three or more appointments without notifying the office, you may be dismissed from the clinic at the provider's discretion.      For prescription refill requests, have your pharmacy contact our office and allow 72 hours for refills to be completed.    Today you received 1 unit of platelets     BELOW ARE SYMPTOMS THAT SHOULD BE REPORTED IMMEDIATELY: *FEVER GREATER THAN 100.4 F (38 C) OR HIGHER *CHILLS OR SWEATING *NAUSEA AND VOMITING THAT IS NOT CONTROLLED WITH YOUR NAUSEA MEDICATION *UNUSUAL SHORTNESS OF BREATH *UNUSUAL BRUISING OR BLEEDING *URINARY PROBLEMS (pain or burning when urinating, or frequent urination) *BOWEL PROBLEMS (unusual diarrhea, constipation, pain near the anus) TENDERNESS IN MOUTH AND THROAT WITH OR WITHOUT PRESENCE OF ULCERS (sore throat, sores in mouth, or a toothache) UNUSUAL RASH, SWELLING OR PAIN  UNUSUAL VAGINAL DISCHARGE OR ITCHING   Items with * indicate a potential emergency and should be followed up as soon as possible or go to the Emergency Department if any problems should occur.  Please show the CHEMOTHERAPY ALERT CARD or IMMUNOTHERAPY ALERT CARD at check-in to the Emergency Department and triage nurse.  Should you have questions after your visit or need to cancel or reschedule your appointment, please contact [facility] [phone]  and follow the prompts.  Office hours are 8:00 a.m. to 4:30 p.m. Monday - Friday. Please note that voicemails left after 4:00 p.m. may not be returned until the following business day.  We are closed weekends and major holidays. You have access to a nurse at all times for urgent questions. Please call the main number to the clinic [phone] and follow the prompts.  For any non-urgent questions, you may also contact your provider using MyChart. We now offer e-Visits for anyone 10 and older to request care online for non-urgent symptoms. For details visit [email or web address].   Also download the MyChart app! Go to the app store, search "MyChart", open the app, select [facility], and log in with your MyChart username and password.  Masks are optional in the cancer centers. If you would like for your care team to wear a mask while they are taking care of you, please let them know. You may have one support person who is at least 80 years old accompany you for your appointments.

## 2022-02-18 NOTE — Progress Notes (Signed)
Pt presents today for 1 unit of platelets per provider's order. Vital signs stable and pt voiced no new complaints at this time.  Peripheral IV started with good blood return pre and post infusion.  1 unit of platelets given today per MD orders. Tolerated infusion without adverse affects. Vital signs stable. No complaints at this time. Discharged from clinic via wheelchair in stable condition. Alert and oriented x 3. F/U with [facility] as scheduled.

## 2022-02-19 LAB — PREPARE PLATELET PHERESIS: Unit division: 0

## 2022-02-19 LAB — BPAM PLATELET PHERESIS
Blood Product Expiration Date: 202312142359
ISSUE DATE / TIME: 202312141009
Unit Type and Rh: 6200

## 2022-02-19 LAB — PROTEIN ELECTROPHORESIS, SERUM
A/G Ratio: 0.9 (ref 0.7–1.7)
Albumin ELP: 2.6 g/dL — ABNORMAL LOW (ref 2.9–4.4)
Alpha-1-Globulin: 0.5 g/dL — ABNORMAL HIGH (ref 0.0–0.4)
Alpha-2-Globulin: 0.9 g/dL (ref 0.4–1.0)
Beta Globulin: 1.3 g/dL (ref 0.7–1.3)
Gamma Globulin: 0.2 g/dL — ABNORMAL LOW (ref 0.4–1.8)
Globulin, Total: 2.9 g/dL (ref 2.2–3.9)
M-Spike, %: 0.4 g/dL — ABNORMAL HIGH
Total Protein ELP: 5.5 g/dL — ABNORMAL LOW (ref 6.0–8.5)

## 2022-02-24 ENCOUNTER — Inpatient Hospital Stay: Payer: Medicare Other

## 2022-02-24 DIAGNOSIS — C9000 Multiple myeloma not having achieved remission: Secondary | ICD-10-CM

## 2022-02-24 DIAGNOSIS — D649 Anemia, unspecified: Secondary | ICD-10-CM

## 2022-02-24 DIAGNOSIS — Z5112 Encounter for antineoplastic immunotherapy: Secondary | ICD-10-CM | POA: Diagnosis not present

## 2022-02-24 LAB — CBC WITH DIFFERENTIAL/PLATELET
Abs Immature Granulocytes: 0.01 10*3/uL (ref 0.00–0.07)
Basophils Absolute: 0 10*3/uL (ref 0.0–0.1)
Basophils Relative: 0 %
Eosinophils Absolute: 0 10*3/uL (ref 0.0–0.5)
Eosinophils Relative: 1 %
HCT: 19.6 % — ABNORMAL LOW (ref 36.0–46.0)
Hemoglobin: 6.4 g/dL — CL (ref 12.0–15.0)
Immature Granulocytes: 1 %
Lymphocytes Relative: 45 %
Lymphs Abs: 0.7 10*3/uL (ref 0.7–4.0)
MCH: 31.7 pg (ref 26.0–34.0)
MCHC: 32.7 g/dL (ref 30.0–36.0)
MCV: 97 fL (ref 80.0–100.0)
Monocytes Absolute: 0.1 10*3/uL (ref 0.1–1.0)
Monocytes Relative: 7 %
Neutro Abs: 0.7 10*3/uL — ABNORMAL LOW (ref 1.7–7.7)
Neutrophils Relative %: 46 %
Platelets: 72 10*3/uL — ABNORMAL LOW (ref 150–400)
RBC: 2.02 MIL/uL — ABNORMAL LOW (ref 3.87–5.11)
RDW: 16.8 % — ABNORMAL HIGH (ref 11.5–15.5)
WBC: 1.5 10*3/uL — ABNORMAL LOW (ref 4.0–10.5)
nRBC: 0 % (ref 0.0–0.2)

## 2022-02-24 LAB — COMPREHENSIVE METABOLIC PANEL
ALT: 14 U/L (ref 0–44)
AST: 16 U/L (ref 15–41)
Albumin: 3.1 g/dL — ABNORMAL LOW (ref 3.5–5.0)
Alkaline Phosphatase: 73 U/L (ref 38–126)
Anion gap: 11 (ref 5–15)
BUN: 34 mg/dL — ABNORMAL HIGH (ref 8–23)
CO2: 29 mmol/L (ref 22–32)
Calcium: 8.1 mg/dL — ABNORMAL LOW (ref 8.9–10.3)
Chloride: 98 mmol/L (ref 98–111)
Creatinine, Ser: 1.09 mg/dL — ABNORMAL HIGH (ref 0.44–1.00)
GFR, Estimated: 51 mL/min — ABNORMAL LOW (ref >60)
Glucose, Bld: 99 mg/dL (ref 70–99)
Potassium: 3.2 mmol/L — ABNORMAL LOW (ref 3.5–5.1)
Sodium: 138 mmol/L (ref 135–145)
Total Bilirubin: 0.7 mg/dL (ref 0.3–1.2)
Total Protein: 6.2 g/dL — ABNORMAL LOW (ref 6.5–8.1)

## 2022-02-24 LAB — MAGNESIUM: Magnesium: 2 mg/dL (ref 1.7–2.4)

## 2022-02-24 LAB — SAMPLE TO BLOOD BANK

## 2022-02-24 NOTE — Progress Notes (Unsigned)
Hemoglobin is 6.4, will give 2 units of blood per MD on Friday. Patient is aware.

## 2022-02-24 NOTE — Progress Notes (Unsigned)
CRITICAL VALUE STICKER  CRITICAL VALUE: Hemoglobin 6.4  RECEIVER (on-site recipient of call): Ronnald Nian, RN  DATE & TIME NOTIFIED: 02/24/2022 at 1252  MESSENGER (representative from lab): Mortimer Fries  MD NOTIFIED:  Dr. Delton Coombes  TIME OF NOTIFICATION: 7096  RESPONSE:  Give 2 units of blood per Dr. Delton Coombes.

## 2022-02-26 ENCOUNTER — Inpatient Hospital Stay: Payer: Medicare Other

## 2022-02-26 DIAGNOSIS — Z5112 Encounter for antineoplastic immunotherapy: Secondary | ICD-10-CM | POA: Diagnosis not present

## 2022-02-26 DIAGNOSIS — D649 Anemia, unspecified: Secondary | ICD-10-CM

## 2022-02-26 DIAGNOSIS — C9000 Multiple myeloma not having achieved remission: Secondary | ICD-10-CM

## 2022-02-26 MED ORDER — SODIUM CHLORIDE 0.9% IV SOLUTION
250.0000 mL | Freq: Once | INTRAVENOUS | Status: AC
  Administered 2022-02-26: 250 mL via INTRAVENOUS

## 2022-02-26 NOTE — Progress Notes (Signed)
Pt presents today for 2 units of blood per provider's order. Vital signs stable and pt voiced no new complaints at this time.  Peripheral IV started at good blood return pre and post infusion.  2 units of blood  given today per MD orders. Tolerated infusion without adverse affects. Vital signs stable. No complaints at this time. Discharged from clinic via wheelchair in stable condition. Alert and oriented x 3. F/U with [facility] as scheduled.

## 2022-02-26 NOTE — Progress Notes (Signed)
UNMATCHED BLOOD PRODUCT NOTE  Compare the patient ID on the blood tag to the patient ID on the hospital armband and Blood Bank armband. Then confirm the unit number on the blood tag matches the unit number on the blood product.  If a discrepancy is discovered return the product to blood bank immediately.   Blood Product Type: Packed Red Blood Cells  Verified by 2 RN's : Taisia Fantini,RN and Ronne Binning, RN  Unit #: (Found on blood product bag, begins with W) [id number]  Product Code #: (Found on blood product bag, begins with E) [id number]   Start Time: 0835   Starting Rate: 120 ml/hr  Rate increase/decreased  (if applicable):  503TW/SF  Rate changed time (if applicable): 6812   Stop Time: 1010    All Other Documentation should be documented within the Blood Admin Flowsheet per policy.

## 2022-02-26 NOTE — Patient Instructions (Signed)
[facility]  Discharge Instructions: Thank you for choosing [facility] to provide your oncology and hematology care.  If you have a lab appointment with the Cancer Center, please come in thru the Main Entrance and check in at the main information desk.  Wear comfortable clothing and clothing appropriate for easy access to any Portacath or PICC line.   We strive to give you quality time with your provider. You may need to reschedule your appointment if you arrive late (15 or more minutes).  Arriving late affects you and other patients whose appointments are after yours.  Also, if you miss three or more appointments without notifying the office, you may be dismissed from the clinic at the provider's discretion.      For prescription refill requests, have your pharmacy contact our office and allow 72 hours for refills to be completed.    Today you received 2 units of blood.    BELOW ARE SYMPTOMS THAT SHOULD BE REPORTED IMMEDIATELY: *FEVER GREATER THAN 100.4 F (38 C) OR HIGHER *CHILLS OR SWEATING *NAUSEA AND VOMITING THAT IS NOT CONTROLLED WITH YOUR NAUSEA MEDICATION *UNUSUAL SHORTNESS OF BREATH *UNUSUAL BRUISING OR BLEEDING *URINARY PROBLEMS (pain or burning when urinating, or frequent urination) *BOWEL PROBLEMS (unusual diarrhea, constipation, pain near the anus) TENDERNESS IN MOUTH AND THROAT WITH OR WITHOUT PRESENCE OF ULCERS (sore throat, sores in mouth, or a toothache) UNUSUAL RASH, SWELLING OR PAIN  UNUSUAL VAGINAL DISCHARGE OR ITCHING   Items with * indicate a potential emergency and should be followed up as soon as possible or go to the Emergency Department if any problems should occur.  Please show the CHEMOTHERAPY ALERT CARD or IMMUNOTHERAPY ALERT CARD at check-in to the Emergency Department and triage nurse.  Should you have questions after your visit or need to cancel or reschedule your appointment, please contact [facility] [phone]  and follow the prompts.  Office hours are 8:00 a.m. to 4:30 p.m. Monday - Friday. Please note that voicemails left after 4:00 p.m. may not be returned until the following business day.  We are closed weekends and major holidays. You have access to a nurse at all times for urgent questions. Please call the main number to the clinic [phone] and follow the prompts.  For any non-urgent questions, you may also contact your provider using MyChart. We now offer e-Visits for anyone 18 and older to request care online for non-urgent symptoms. For details visit [email or web address].   Also download the MyChart app! Go to the app store, search "MyChart", open the app, select [facility], and log in with your MyChart username and password.  Masks are optional in the cancer centers. If you would like for your care team to wear a mask while they are taking care of you, please let them know. You may have one support person who is at least 80 years old accompany you for your appointments.  

## 2022-02-27 LAB — BPAM RBC
Blood Product Expiration Date: 202401152359
Blood Product Expiration Date: 202401152359
ISSUE DATE / TIME: 202312221019
ISSUE DATE / TIME: 202312221019
Unit Type and Rh: 5100
Unit Type and Rh: 5100

## 2022-02-27 LAB — TYPE AND SCREEN
ABO/RH(D): O POS
Antibody Screen: POSITIVE
Donor AG Type: NEGATIVE
Donor AG Type: NEGATIVE
Unit division: 0
Unit division: 0

## 2022-03-04 ENCOUNTER — Inpatient Hospital Stay (HOSPITAL_BASED_OUTPATIENT_CLINIC_OR_DEPARTMENT_OTHER): Payer: Medicare Other | Admitting: Hematology

## 2022-03-04 ENCOUNTER — Inpatient Hospital Stay: Payer: Medicare Other

## 2022-03-04 DIAGNOSIS — Z5112 Encounter for antineoplastic immunotherapy: Secondary | ICD-10-CM | POA: Diagnosis not present

## 2022-03-04 DIAGNOSIS — D649 Anemia, unspecified: Secondary | ICD-10-CM

## 2022-03-04 DIAGNOSIS — C9000 Multiple myeloma not having achieved remission: Secondary | ICD-10-CM

## 2022-03-04 LAB — COMPREHENSIVE METABOLIC PANEL
ALT: 38 U/L (ref 0–44)
AST: 25 U/L (ref 15–41)
Albumin: 3.1 g/dL — ABNORMAL LOW (ref 3.5–5.0)
Alkaline Phosphatase: 110 U/L (ref 38–126)
Anion gap: 9 (ref 5–15)
BUN: 32 mg/dL — ABNORMAL HIGH (ref 8–23)
CO2: 30 mmol/L (ref 22–32)
Calcium: 8.3 mg/dL — ABNORMAL LOW (ref 8.9–10.3)
Chloride: 102 mmol/L (ref 98–111)
Creatinine, Ser: 0.82 mg/dL (ref 0.44–1.00)
GFR, Estimated: >60 mL/min (ref >60)
Glucose, Bld: 110 mg/dL — ABNORMAL HIGH (ref 70–99)
Potassium: 2.9 mmol/L — ABNORMAL LOW (ref 3.5–5.1)
Sodium: 141 mmol/L (ref 135–145)
Total Bilirubin: 0.4 mg/dL (ref 0.3–1.2)
Total Protein: 6 g/dL — ABNORMAL LOW (ref 6.5–8.1)

## 2022-03-04 LAB — SAMPLE TO BLOOD BANK

## 2022-03-04 LAB — MAGNESIUM: Magnesium: 2.1 mg/dL (ref 1.7–2.4)

## 2022-03-04 MED ORDER — POTASSIUM CHLORIDE CRYS ER 20 MEQ PO TBCR
40.0000 meq | EXTENDED_RELEASE_TABLET | Freq: Once | ORAL | Status: AC
  Administered 2022-03-04: 40 meq via ORAL
  Filled 2022-03-04: qty 2

## 2022-03-04 MED ORDER — POTASSIUM CHLORIDE CRYS ER 10 MEQ PO TBCR: 20.0000 meq | EXTENDED_RELEASE_TABLET | Freq: Every day | ORAL | 3 refills | Status: AC

## 2022-03-04 MED ORDER — DARATUMUMAB-HYALURONIDASE-FIHJ 1800-30000 MG-UT/15ML ~~LOC~~ SOLN
1800.0000 mg | Freq: Once | SUBCUTANEOUS | Status: AC
  Administered 2022-03-04: 1800 mg via SUBCUTANEOUS
  Filled 2022-03-04: qty 15

## 2022-03-04 NOTE — Patient Instructions (Signed)
[facility]  Discharge Instructions: Thank you for choosing [facility] to provide your oncology and hematology care.  If you have a lab appointment with the [facility], please come in thru the Main Entrance and check in at the main information desk.  Wear comfortable clothing and clothing appropriate for easy access to any Portacath or PICC line.   We strive to give you quality time with your provider. You may need to reschedule your appointment if you arrive late (15 or more minutes).  Arriving late affects you and other patients whose appointments are after yours.  Also, if you miss three or more appointments without notifying the office, you may be dismissed from the clinic at the provider's discretion.      For prescription refill requests, have your pharmacy contact our office and allow 72 hours for refills to be completed.    Today you received the following chemotherapy and/or immunotherapy agents daratumumab.  Daratumumab; Hyaluronidase Injection What is this medication? DARATUMUMAB; HYALURONIDASE (dar a toom ue mab; hye al ur ON i dase) treats multiple myeloma, a type of bone marrow cancer. Daratumumab works by blocking a protein that causes cancer cells to grow and multiply. This helps to slow or stop the spread of cancer cells. Hyaluronidase works by increasing the absorption of other medications in the body to help them work better. This medication may also be used treat amyloidosis, a condition that causes the buildup of a protein (amyloid) in your body. It works by reducing the buildup of this protein, which decreases symptoms. It is a combination medication that contains a monoclonal antibody. This medicine may be used for other purposes; ask your health care provider or pharmacist if you have questions. COMMON BRAND NAME(S): DARZALEX FASPRO What should I tell my care team before I take this medication? They need to know if you have any of these  conditions: Heart disease Infection, such as chickenpox, cold sores, herpes, hepatitis B Lung or breathing disease An unusual or allergic reaction to daratumumab, hyaluronidase, other medications, foods, dyes, or preservatives Pregnant or trying to get pregnant Breast-feeding How should I use this medication? This medication is injected under the skin. It is given by your care team in a hospital or clinic setting. Talk to your care team about the use of this medication in children. Special care may be needed. Overdosage: If you think you have taken too much of this medicine contact a poison control center or emergency room at once. NOTE: This medicine is only for you. Do not share this medicine with others. What if I miss a dose? Keep appointments for follow-up doses. It is important not to miss your dose. Call your care team if you are unable to keep an appointment. What may interact with this medication? Interactions have not been studied. This list may not describe all possible interactions. Give your health care provider a list of all the medicines, herbs, non-prescription drugs, or dietary supplements you use. Also tell them if you smoke, drink alcohol, or use illegal drugs. Some items may interact with your medicine. What should I watch for while using this medication? Your condition will be monitored carefully while you are receiving this medication. This medication can cause serious allergic reactions. To reduce your risk, your care team may give you other medication to take before receiving this one. Be sure to follow the directions from your care team. This medication can affect the results of blood tests to match  your blood type. These changes can last for up to 6 months after the final dose. Your care team will do blood tests to match your blood type before you start treatment. Tell all of your care team that you are being treated with this medication before receiving a blood  transfusion. This medication can affect the results of some tests used to determine treatment response; extra tests may be needed to evaluate response. Talk to your care team if you wish to become pregnant or think you are pregnant. This medication can cause serious birth defects if taken during pregnancy and for 3 months after the last dose. A reliable form of contraception is recommended while taking this medication and for 3 months after the last dose. Talk to your care team about effective forms of contraception. Do not breast-feed while taking this medication. What side effects may I notice from receiving this medication? Side effects that you should report to your care team as soon as possible: Allergic reactions--skin rash, itching, hives, swelling of the face, lips, tongue, or throat Heart rhythm changes--fast or irregular heartbeat, dizziness, feeling faint or lightheaded, chest pain, trouble breathing Infection--fever, chills, cough, sore throat, wounds that don't heal, pain or trouble when passing urine, general feeling of discomfort or being unwell Infusion reactions--chest pain, shortness of breath or trouble breathing, feeling faint or lightheaded Sudden eye pain or change in vision such as blurry vision, seeing halos around lights, vision loss Unusual bruising or bleeding Side effects that usually do not require medical attention (report to your care team if they continue or are bothersome): Constipation Diarrhea Fatigue Nausea Pain, tingling, or numbness in the hands or feet Swelling of the ankles, hands, or feet This list may not describe all possible side effects. Call your doctor for medical advice about side effects. You may report side effects to FDA at 1-800-FDA-1088. Where should I keep my medication? This medication is given in a hospital or clinic. It will not be stored at home. NOTE: This sheet is a summary. It may not cover all possible information. If you have  questions about this medicine, talk to your doctor, pharmacist, or health care provider.  2023 Elsevier/Gold Standard (2021-06-17 00:00:00)       To help prevent nausea and vomiting after your treatment, we encourage you to take your nausea medication as directed.  BELOW ARE SYMPTOMS THAT SHOULD BE REPORTED IMMEDIATELY: *FEVER GREATER THAN 100.4 F (38 C) OR HIGHER *CHILLS OR SWEATING *NAUSEA AND VOMITING THAT IS NOT CONTROLLED WITH YOUR NAUSEA MEDICATION *UNUSUAL SHORTNESS OF BREATH *UNUSUAL BRUISING OR BLEEDING *URINARY PROBLEMS (pain or burning when urinating, or frequent urination) *BOWEL PROBLEMS (unusual diarrhea, constipation, pain near the anus) TENDERNESS IN MOUTH AND THROAT WITH OR WITHOUT PRESENCE OF ULCERS (sore throat, sores in mouth, or a toothache) UNUSUAL RASH, SWELLING OR PAIN  UNUSUAL VAGINAL DISCHARGE OR ITCHING   Items with * indicate a potential emergency and should be followed up as soon as possible or go to the Emergency Department if any problems should occur.  Please show the CHEMOTHERAPY ALERT CARD or IMMUNOTHERAPY ALERT CARD at check-in to the Emergency Department and triage nurse.  Should you have questions after your visit or need to cancel or reschedule your appointment, please contact [facility] [phone]  and follow the prompts.  Office hours are 8:00 a.m. to 4:30 p.m. Monday - Friday. Please note that voicemails left after 4:00 p.m. may not be returned until the following business day.  We are closed weekends and major holidays. You have access to a nurse at all times for urgent questions. Please call the main number to the clinic [phone] and follow the prompts.  For any non-urgent questions, you may also contact your provider using MyChart. We now offer e-Visits for anyone 77 and older to request care online for non-urgent symptoms. For details visit [email or web address].   Also download the MyChart app! Go to the app  store, search "MyChart", open the app, select [facility], and log in with your MyChart username and password.

## 2022-03-04 NOTE — Progress Notes (Signed)
Patient is taking Pomalyst as prescribed.  She has not missed any doses and reports no side effects at this time.    Patient has been examined by Dr. Delton Coombes, and vital signs and labs have been reviewed. ANC, Creatinine, LFTs, hemoglobin, and platelets are within treatment parameters per M.D. - pt may proceed with treatment.  Primary RN and pharmacy notified.

## 2022-03-04 NOTE — Patient Instructions (Addendum)
[facility]ney Arnold at [facility] Discharge Instructions   You were seen and examined today by Dr. Delton Coombes.  He reviewed the results of your lab work. Your potassium is low at 2.9. We will give you potassium pills in the clinic. We also sent a prescription to your pharmacy. Take as prescribed.   We will proceed with your treatment today.  Return as scheduled.    Thank you for choosing [facility] at [facility] to provide your oncology and hematology care.  To afford each patient quality time with our provider, please arrive at least 15 minutes before your scheduled appointment time.   If you have a lab appointment with the [facility] please come in thru the Main Entrance and check in at the main information desk.  You need to re-schedule your appointment should you arrive 10 or more minutes late.  We strive to give you quality time with our providers, and arriving late affects you and other patients whose appointments are after yours.  Also, if you no show three or more times for appointments you may be dismissed from the clinic at the providers discretion.     Again, thank you for choosing [facility].  Our hope is that these requests will decrease the amount of time that you wait before being seen by our physicians.       _____________________________________________________________  Should you have questions after your visit to [facility], please contact our office at [phone] and follow the prompts.  Our office hours are 8:00 a.m. and 4:30 p.m. Monday - Friday.  Please note that voicemails left after 4:00 p.m. may not be returned until the following business day.  We are closed weekends and major holidays.  You do have access to a nurse 24-7, just call the main number to the clinic [phone] and do not press any options, hold on the line and a nurse will answer the phone.    For prescription refill requests,  have your pharmacy contact our office and allow 72 hours.    Due to Covid, you will need to wear a mask upon entering the hospital. If you do not have a mask, a mask will be given to you at the Main Entrance upon arrival. For doctor visits, patients may have 1 support person age 16 or older with them. For treatment visits, patients can not have anyone with them due to social distancing guidelines and our immunocompromised population.

## 2022-03-04 NOTE — Progress Notes (Signed)
Patient took tylenol and benadryl at home.  Takes Dexamethasone on Tuesday and Wednesdays.    Patient tolerated Daratumumab injection with no complaints voiced.  See MAR for details.  Labs reviewed. Injection site clean and dry with no bruising or swelling noted at site.  Band aid applied.  Vss with discharge and left in satisfactory condition with no s/s of distress noted.

## 2022-03-04 NOTE — Progress Notes (Signed)
[place], [place] [address]   CLINIC:  Medical Oncology/Hematology  PCP:  [name], MD [address] / [place] [address] [phone]   REASON FOR VISIT:  Follow-up for IgA kappa plasma cell myeloma  PRIOR THERAPY: Velcade x 9 cycles from 07/04/2019 to 01/10/2020  CURRENT THERAPY: Pomalidomide, daratumumab and dexamethasone  BRIEF ONCOLOGIC HISTORY:  Oncology History  Multiple myeloma not having achieved remission ([place])  06/07/2019 Initial Diagnosis   Multiple myeloma not having achieved remission ([place])   07/04/2019 - 01/10/2020 Chemotherapy   The patient had dexamethasone (DECADRON) 4 MG tablet, 1 of 1 cycle, Start date: 01/10/2020, End date: -- lenalidomide (REVLIMID) 15 MG capsule, 1 of 1 cycle, Start date: 02/26/2020, End date: -- bortezomib SQ (VELCADE) chemo injection 2.25 mg, 1.3 mg/m2 = 2.25 mg, Subcutaneous,  Once, 9 of 10 cycles Administration: 2.25 mg (07/04/2019), 2.25 mg (07/11/2019), 2.25 mg (07/26/2019), 2.25 mg (08/02/2019), 2.25 mg (08/09/2019), 2.25 mg (08/16/2019), 2.25 mg (08/23/2019), 2.25 mg (08/30/2019), 2.25 mg (09/06/2019), 2.25 mg (09/13/2019), 2.25 mg (09/20/2019), 2.25 mg (09/27/2019), 2.25 mg (10/04/2019), 2.25 mg (10/11/2019), 2.25 mg (10/18/2019), 2.25 mg (10/25/2019), 2.25 mg (11/01/2019), 2.25 mg (11/08/2019), 2.25 mg (11/15/2019), 2.25 mg (11/22/2019), 2.25 mg (11/29/2019), 2.25 mg (12/06/2019), 2.25 mg (12/13/2019), 2.25 mg (12/20/2019), 2.25 mg (12/27/2019), 2.25 mg (01/03/2020), 2.25 mg (01/10/2020)  for chemotherapy treatment.    09/15/2021 - 11/04/2021 Chemotherapy   Patient is on Treatment Plan : MYELOMA RELAPSED REFRACTORY Daratumumab SQ + Pomalidomide + Dexamethasone (DaraPd) q28d     09/15/2021 -  Chemotherapy   Patient is on Treatment Plan : MYELOMA RELAPSED REFRACTORY Daratumumab SQ + Pomalidomide + Dexamethasone (DaraPd) q28d       CANCER STAGING:  Cancer Staging  No matching staging information was found for the  patient.  INTERVAL HISTORY:  [name], a 81 y.o. female, seen for follow-up of multiple myeloma on toxicity assessment prior to next dose of daratumumab.  She denies any nosebleeds in the last 2 weeks.  Pomalyst dose was increased to 3 mg 3 weeks on/1 week off last Tuesday on 02/23/2022.  She takes dexamethasone 2 and half tablets on Tuesday and Wednesday.  Started Ritalin this morning.  Overall she has been more awake.  Energy levels are 70%.  REVIEW OF SYSTEMS:  Review of Systems  Constitutional:  Negative for appetite change and fatigue.  HENT:   Negative for nosebleeds.   Respiratory:  Negative for cough.   Gastrointestinal:  Negative for blood in stool.  All other systems reviewed and are negative.   PAST MEDICAL/SURGICAL HISTORY:  Past Medical History:  Diagnosis Date   Atrial fibrillation ([place]) 2011   Postop, spontaneous conversion to normal sinus after one hour   CAP (community acquired pneumonia) 09/23/2021   Cholecystitis, acute 04/25/2015   Compression fracture 07/24/2009   T12; kyphoplasty   History of echocardiogram 07/2009   EF 65%   Hypertension    Thyroid disease    Tobacco abuse    Past Surgical History:  Procedure Laterality Date   BACK SURGERY     BACK SURGERY  06/06/2015   BREAST EXCISIONAL BIOPSY Left    50 years ago  benign   CHOLECYSTECTOMY N/A 04/25/2015   Procedure: LAPAROSCOPIC CHOLECYSTECTOMY WITH INTRAOPERATIVE CHOLANGIOGRAM;  Surgeon: Mickeal Skinner, MD;  Location: [facility];  Service: General;  Laterality: N/A;   COLONOSCOPY N/A 11/26/2015   Procedure: COLONOSCOPY;  Surgeon: Daneil Dolin, MD;  Location: [facility];  Service: Endoscopy;  Laterality: N/A;  7:30 am   ERCP N/A 04/14/2015   Procedure: ENDOSCOPIC RETROGRADE CHOLANGIOPANCREATOGRAPHY (ERCP) Biliary Sphincterotomy, 10x7 stent placement Dilated bilary system just not well seen;  Surgeon: Rogene Houston, MD;  Location: [facility];  Service: Endoscopy;  Laterality: N/A;   ERCP  N/A 06/12/2015   Procedure: ENDOSCOPIC RETROGRADE CHOLANGIOPANCREATOGRAPHY (ERCP);  Surgeon: Rogene Houston, MD;  Location[facility];  Service: Endoscopy;  Laterality: N/A;   ESOPHAGOGASTRODUODENOSCOPY N/A 06/12/2015   Procedure: DIAGNOSTIC ESOPHAGOGASTRODUODENOSCOPY (EGD);  Surgeon: Rogene Houston, MD;  [facility];  Service: Endoscopy;  Laterality: N/A;   FEMUR IM NAIL Right 05/11/2019   Procedure: RETROGRADE INTRAMEDULLARY NAIL FEMORAL;  Surgeon: Shona Needles, MD;  Location: [facility];  Service: Orthopedics;  Laterality: Right;   STENT REMOVAL  06/12/2015   Procedure: STENT REMOVAL ;  Surgeon: Rogene Houston, MD;  [facility];  Service: Endoscopy;;    SOCIAL HISTORY:  Social History   Socioeconomic History   Marital status: Widowed    Spouse name: Not on file   Number of children: 2   Years of education: Not on file   Highest education level: Not on file  Occupational History   Occupation: retired  Tobacco Use   Smoking status: Former    Packs/day: 0.15    Years: 20.00    Total pack years: 3.00    Types: Cigarettes    Quit date: 03/08/2013    Years since quitting: 8.9   Smokeless tobacco: Never  Vaping Use   Vaping Use: Never used  Substance and Sexual Activity   Alcohol use: No    Alcohol/week: 0.0 standard drinks of alcohol   Drug use: No   Sexual activity: Not Currently  Other Topics Concern   Not on file  Social History Narrative   Active in gardens and does yard work.   Social Determinants of Health   Financial Resource Strain: Low Risk  (05/08/2019)   Overall Financial Resource Strain (CARDIA)    Difficulty of Paying Living Expenses: Not hard at all  Food Insecurity: No Food Insecurity (01/14/2022)   Hunger Vital Sign    Worried About Running Out of Food in the Last Year: Never true    Ran Out of Food in the Last Year: Never true  Transportation Needs: No Transportation Needs (01/14/2022)   PRAPARE - Radiographer, therapeutic (Medical): No    Lack of Transportation (Non-Medical): No  Physical Activity: Inactive (05/08/2019)   Exercise Vital Sign    Days of Exercise per Week: 0 days    Minutes of Exercise per Session: 0 min  Stress: No Stress Concern Present (05/08/2019)   [place]    Feeling of Stress : Not at all  Social Connections: Moderately Isolated (02/06/2020)   Social Connection and Isolation Panel [NHANES]    Frequency of Communication with Friends and Family: More than three times a week    Frequency of Social Gatherings with Friends and Family: More than three times a week    Attends Religious Services: More than 4 times per year    Active Member of [employer] or Organizations: No    Attends Archivist Meetings: Never    Marital Status: Widowed  Intimate Partner Violence: Not At Risk (01/14/2022)   Humiliation, Afraid, Rape, and Kick questionnaire    Fear of Current or Ex-Partner: No  Emotionally Abused: No    Physically Abused: No    Sexually Abused: No    FAMILY HISTORY:  Family History  Problem Relation Age of Onset   Heart attack Father    Stroke Father    Breast cancer Sister    Thyroid disease Neg Hx    Colon cancer Neg Hx     CURRENT MEDICATIONS:  Current Outpatient Medications  Medication Sig Dispense Refill   acyclovir (ZOVIRAX) 400 MG tablet TAKE (1) TABLET BY MOUTH TWICE DAILY. (Patient taking differently: Take 400 mg by mouth 2 (two) times daily.) 60 tablet 6   CALCIUM PO Take 1 capsule by mouth daily.     carboxymethylcellulose (REFRESH PLUS) 0.5 % SOLN Place 1 drop into both eyes at bedtime.     Cholecalciferol (VITAMIN D) 125 MCG (5000 UT) CAPS Take 5,000 Units by mouth daily. 90 capsule 1   Cyanocobalamin (B-12 PO) Take 1 capsule by mouth daily.     dexamethasone (DECADRON) 4 MG tablet Take 2.5 tablets (10 mg total) by mouth 2 (two) times a week. (Patient taking differently: Take 10 mg  by mouth 2 (two) times a week. Takes Tuesday and Wednesday) 20 tablet 4   Ferrous Sulfate (IRON PO) Take 1 capsule by mouth daily.     levothyroxine (SYNTHROID) 137 MCG tablet Take 1 tablet (137 mcg total) by mouth daily before breakfast. 90 tablet 0   methylphenidate (RITALIN) 5 MG tablet Take 1 tablet (5 mg total) by mouth daily as needed. 30 tablet 0   montelukast (SINGULAIR) 10 MG tablet Take one tablet by mouth 1 hour prior to Darzalex injection appointments (Patient taking differently: Take one tablet by mouth 1 hour prior to Darzalex injection appointments - given every other Wednesday) 30 tablet 2   Multiple Vitamin (MULTIVITAMIN WITH MINERALS) TABS tablet Take 1 tablet by mouth daily.     ondansetron (ZOFRAN) 8 MG tablet Take 1 tablet (8 mg total) by mouth every 8 (eight) hours as needed for nausea or vomiting. 60 tablet 0   pomalidomide (POMALYST) 3 MG capsule Take 1 capsule (3 mg total) by mouth daily. Take for 21 days, then hold for 7 days. Repeat every 28 days. 21 capsule 0   potassium gluconate 595 (99 K) MG TABS tablet Take 595 mg by mouth daily.     No current facility-administered medications for this visit.   Facility-Administered Medications Ordered in Other Visits  Medication Dose Route Frequency Provider Last Rate Last Admin   0.9 %  sodium chloride infusion (Manually program via Guardrails IV Fluids)  250 mL Intravenous Once Derek Jack, MD       0.9 %  sodium chloride infusion   Intravenous Continuous Derek Jack, MD   Stopped at 09/15/21 1454    ALLERGIES:  No Known Allergies  PHYSICAL EXAM:  Performance status (ECOG): 1 - Symptomatic but completely ambulatory  There were no vitals filed for this visit.   Wt Readings from Last 3 Encounters:  02/03/22 125 lb 6.4 oz (56.9 kg)  01/20/22 132 lb (59.9 kg)  01/15/22 133 lb 6.1 oz (60.5 kg)   Physical Exam Vitals reviewed.  Constitutional:      Appearance: Normal appearance.  Cardiovascular:      Rate and Rhythm: Normal rate and regular rhythm.     Pulses: Normal pulses.     Heart sounds: Normal heart sounds.  Pulmonary:     Effort: Pulmonary effort is normal.     Breath sounds: Normal breath sounds.  Neurological:     General: No focal deficit present.     Mental Status: She is alert and oriented to person, place, and time.  Psychiatric:        Mood and Affect: Mood normal.        Behavior: Behavior normal.   Blood in the back of the throat.  LABORATORY DATA:  I have reviewed the labs as listed.     Latest Ref Rng & Units 02/24/2022   12:24 PM 02/17/2022    8:00 AM 02/10/2022   12:41 PM  CBC  WBC 4.0 - 10.5 K/uL 1.5  0.9  1.0   Hemoglobin 12.0 - 15.0 g/dL 6.4  8.4  10.6   Hematocrit 36.0 - 46.0 % 19.6  25.2  31.7   Platelets 150 - 400 K/uL 72  29  26       Latest Ref Rng & Units 02/24/2022   12:24 PM 02/17/2022    8:00 AM 02/10/2022   12:41 PM  CMP  Glucose 70 - 99 mg/dL 99  129  150   BUN 8 - 23 mg/dL 34  48  41   Creatinine 0.44 - 1.00 mg/dL 1.09  1.15  1.42   Sodium 135 - 145 mmol/L 138  138  137   Potassium 3.5 - 5.1 mmol/L 3.2  3.0  3.8   Chloride 98 - 111 mmol/L 98  97  97   CO2 22 - 32 mmol/L _0 Calcium 8.9 - 10.3 mg/dL 8.1  8.6  7.7   Total Protein 6.5 - 8.1 g/dL 6.2  6.2  6.6   Total Bilirubin 0.3 - 1.2 mg/dL 0.7  0.6  0.4   Alkaline Phos 38 - 126 U/L 73  94  110   AST 15 - 41 U/L _1 ALT 0 - 44 U/L _2 DIAGNOSTIC IMAGING:  I have independently reviewed the scans and discussed with the patient. No results found.   ASSESSMENT:  1.  IgA kappa plasma cell myeloma: -IgA kappa plasma cell myeloma diagnosed on right sacral bone biopsy on 05/17/2019.  SPEP did not show M spike.  Immunofixation shows IgA kappa.  Kappa light chains 42.8, lambda light chains at 9.9, ratio 4.32.  Beta-2 microglobulin 3.7. -PET scan on 06/04/2019 showed expansile lytic lesion involving the right sacral wing.  Lytic lesion involving T10 vertebral  body.  Left seventh rib lesion.  Increased uptake in a pathological fracture involving distal aspect of the right femur. 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 -RVD started on 06/26/2019.  Revlimid 15 mg 2 weeks on/1 week off. -Velcade discontinued on 01/10/2020. -She is taking Revlimid 15 mg 2 weeks on 1 week off, with dexamethasone 20 mg on weeks of Revlimid and 10 mg while she is off Revlimid.  Revlimid dose further reduced to 10 mg 3 weeks on/1 week off.  She has developed progressive anemia. - BMBX (08/07/2021): 38% plasma cells in the marrow.  Cytogenetics was normal.  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 - DPD regimen started on, single agent Darzalex and dexamethasone started on 09/15/2021.  Daratumumab held after first treatment due to pneumonia. - Pomalidomide 2 mg 3 weeks on/1 week off and dexamethasone 20 mg weekly started on 10/06/2021.  Daratumumab added on 10/21/2021.  Pomalidomide dose increased to  3 mg 3 weeks on/1 week off on 02/23/2022.   PLAN:  1.  Relapsed IgA kappa plasma cell myeloma: - Myeloma panel on 02/17/2022 shows M spike improved to 0.4 g from 0.7 g previously.  Free light chain ratio is 15.5, up from 11.67.  Kappa light chains at 31.6. - Reviewed labs today which shows normal LFTs.  CBC shows white count is 1.7 with ANC of 1000.  PLT 46 K. - Continue increased dose pomalidomide 3 mg 3 weeks on/1 week off, started on 02/23/2022. - She will proceed with daratumumab today.  RTC 2 weeks for follow-up with repeat labs.    2.  Hypokalemia: - Potassium today is 2.9.  She will receive potassium in the clinic. - Will start her on K-Dur 20 mg daily at home.   3.  Normocytic anemia: - Bone marrow infiltration and myelosuppression. - Hemoglobin today is 10.8.  She received 2 units last week.   4.  ID prophylaxis: - Continue acyclovir twice daily.  Aspirin on hold.   5.  Myeloma bone  disease: - Calcium is 8.3.  Continue denosumab monthly.  6.  Fatigue: - She took her first Ritalin this morning.   Orders placed this encounter:  No orders of the defined types were placed in this encounter.    Derek Jack, MD [place] [phone]

## 2022-03-05 ENCOUNTER — Inpatient Hospital Stay: Payer: Medicare Other

## 2022-03-05 ENCOUNTER — Encounter (HOSPITAL_COMMUNITY): Payer: Self-pay | Admitting: Hematology

## 2022-03-05 ENCOUNTER — Encounter: Payer: Self-pay | Admitting: Hematology

## 2022-03-05 LAB — CBC WITH DIFFERENTIAL/PLATELET
Abs Immature Granulocytes: 0.02 10*3/uL (ref 0.00–0.07)
Basophils Absolute: 0 10*3/uL (ref 0.0–0.1)
Basophils Relative: 0 %
Eosinophils Absolute: 0 10*3/uL (ref 0.0–0.5)
Eosinophils Relative: 0 %
HCT: 32.8 % — ABNORMAL LOW (ref 36.0–46.0)
Hemoglobin: 10.8 g/dL — ABNORMAL LOW (ref 12.0–15.0)
Immature Granulocytes: 1 %
Lymphocytes Relative: 28 %
Lymphs Abs: 0.5 10*3/uL — ABNORMAL LOW (ref 0.7–4.0)
MCH: 31 pg (ref 26.0–34.0)
MCHC: 32.9 g/dL (ref 30.0–36.0)
MCV: 94.3 fL (ref 80.0–100.0)
Monocytes Absolute: 0.2 10*3/uL (ref 0.1–1.0)
Monocytes Relative: 11 %
Neutro Abs: 1 10*3/uL — ABNORMAL LOW (ref 1.7–7.7)
Neutrophils Relative %: 60 %
Platelets: 46 10*3/uL — ABNORMAL LOW (ref 150–400)
RBC: 3.48 MIL/uL — ABNORMAL LOW (ref 3.87–5.11)
RDW: 15.9 % — ABNORMAL HIGH (ref 11.5–15.5)
WBC: 1.7 10*3/uL — ABNORMAL LOW (ref 4.0–10.5)
nRBC: 0 % (ref 0.0–0.2)

## 2022-03-09 ENCOUNTER — Other Ambulatory Visit: Payer: Self-pay

## 2022-03-09 DIAGNOSIS — D649 Anemia, unspecified: Secondary | ICD-10-CM

## 2022-03-10 ENCOUNTER — Inpatient Hospital Stay: Payer: Medicare Other | Attending: Hematology

## 2022-03-10 DIAGNOSIS — Z5112 Encounter for antineoplastic immunotherapy: Secondary | ICD-10-CM | POA: Diagnosis present

## 2022-03-10 DIAGNOSIS — Z79899 Other long term (current) drug therapy: Secondary | ICD-10-CM | POA: Diagnosis not present

## 2022-03-10 DIAGNOSIS — C9002 Multiple myeloma in relapse: Secondary | ICD-10-CM | POA: Insufficient documentation

## 2022-03-10 DIAGNOSIS — D649 Anemia, unspecified: Secondary | ICD-10-CM

## 2022-03-10 LAB — CBC
HCT: 28.1 % — ABNORMAL LOW (ref 36.0–46.0)
Hemoglobin: 9.1 g/dL — ABNORMAL LOW (ref 12.0–15.0)
MCH: 31.4 pg (ref 26.0–34.0)
MCHC: 32.4 g/dL (ref 30.0–36.0)
MCV: 96.9 fL (ref 80.0–100.0)
Platelets: 46 10*3/uL — ABNORMAL LOW (ref 150–400)
RBC: 2.9 MIL/uL — ABNORMAL LOW (ref 3.87–5.11)
RDW: 16.8 % — ABNORMAL HIGH (ref 11.5–15.5)
WBC: 0.9 10*3/uL — CL (ref 4.0–10.5)
nRBC: 0 % (ref 0.0–0.2)

## 2022-03-10 LAB — SAMPLE TO BLOOD BANK

## 2022-03-10 NOTE — Progress Notes (Unsigned)
CRITICAL VALUE ALERT Critical value received:  WBC 0.9 Date of notification:  03/10/22 Time of notification: 5015 Critical value read back:  Yes.   Nurse who received alert:  C. Champagne Paletta RN MD notified time and response:  [id number], Dr. Delton Coombes, no new orders.

## 2022-03-12 ENCOUNTER — Other Ambulatory Visit: Payer: Self-pay

## 2022-03-12 ENCOUNTER — Inpatient Hospital Stay: Payer: Medicare Other

## 2022-03-12 DIAGNOSIS — C9000 Multiple myeloma not having achieved remission: Secondary | ICD-10-CM

## 2022-03-12 MED ORDER — POMALIDOMIDE 3 MG PO CAPS: 3.0000 mg | ORAL_CAPSULE | Freq: Every day | ORAL | 0 refills | Status: AC

## 2022-03-12 NOTE — Telephone Encounter (Signed)
Chart reviewed. Pomalyst refilled per last office note with Dr. Katragadda.  

## 2022-03-16 ENCOUNTER — Other Ambulatory Visit: Payer: Self-pay

## 2022-03-16 DIAGNOSIS — D649 Anemia, unspecified: Secondary | ICD-10-CM

## 2022-03-17 ENCOUNTER — Inpatient Hospital Stay: Payer: Medicare Other

## 2022-03-17 ENCOUNTER — Inpatient Hospital Stay (HOSPITAL_BASED_OUTPATIENT_CLINIC_OR_DEPARTMENT_OTHER): Payer: Medicare Other | Admitting: Hematology

## 2022-03-17 VITALS — BP 117/66 | HR 92 | Temp 96.8°F | Resp 18 | Wt 126.3 lb

## 2022-03-17 DIAGNOSIS — C9000 Multiple myeloma not having achieved remission: Secondary | ICD-10-CM

## 2022-03-17 DIAGNOSIS — D649 Anemia, unspecified: Secondary | ICD-10-CM

## 2022-03-17 DIAGNOSIS — Z5112 Encounter for antineoplastic immunotherapy: Secondary | ICD-10-CM | POA: Diagnosis not present

## 2022-03-17 LAB — CBC WITH DIFFERENTIAL/PLATELET
Band Neutrophils: 2 %
HCT: 26.5 % — ABNORMAL LOW (ref 36.0–46.0)
Hemoglobin: 8.4 g/dL — ABNORMAL LOW (ref 12.0–15.0)
Lymphocytes Relative: 27 %
MCH: 31.3 pg (ref 26.0–34.0)
MCHC: 31.7 g/dL (ref 30.0–36.0)
MCV: 98.9 fL (ref 80.0–100.0)
Monocytes Relative: 7 %
Neutrophils Relative %: 64 %
Platelets: 44 10*3/uL — ABNORMAL LOW (ref 150–400)
RBC: 2.68 MIL/uL — ABNORMAL LOW (ref 3.87–5.11)
RDW: 17.9 % — ABNORMAL HIGH (ref 11.5–15.5)
WBC: 1.4 10*3/uL — CL (ref 4.0–10.5)
nRBC: 0 % (ref 0.0–0.2)

## 2022-03-17 LAB — COMPREHENSIVE METABOLIC PANEL
ALT: 18 U/L (ref 0–44)
AST: 31 U/L (ref 15–41)
Albumin: 3.1 g/dL — ABNORMAL LOW (ref 3.5–5.0)
Alkaline Phosphatase: 78 U/L (ref 38–126)
Anion gap: 11 (ref 5–15)
BUN: 30 mg/dL — ABNORMAL HIGH (ref 8–23)
CO2: 23 mmol/L (ref 22–32)
Calcium: 8 mg/dL — ABNORMAL LOW (ref 8.9–10.3)
Chloride: 101 mmol/L (ref 98–111)
Creatinine, Ser: 1.21 mg/dL — ABNORMAL HIGH (ref 0.44–1.00)
GFR, Estimated: 45 mL/min — ABNORMAL LOW (ref >60)
Glucose, Bld: 162 mg/dL — ABNORMAL HIGH (ref 70–99)
Potassium: 4.7 mmol/L (ref 3.5–5.1)
Sodium: 135 mmol/L (ref 135–145)
Total Bilirubin: 0.6 mg/dL (ref 0.3–1.2)
Total Protein: 6.4 g/dL — ABNORMAL LOW (ref 6.5–8.1)

## 2022-03-17 LAB — SAMPLE TO BLOOD BANK

## 2022-03-17 LAB — MAGNESIUM: Magnesium: 2 mg/dL (ref 1.7–2.4)

## 2022-03-17 MED ORDER — DARATUMUMAB-HYALURONIDASE-FIHJ 1800-30000 MG-UT/15ML ~~LOC~~ SOLN
1800.0000 mg | Freq: Once | SUBCUTANEOUS | Status: AC
  Administered 2022-03-17: 1800 mg via SUBCUTANEOUS
  Filled 2022-03-17: qty 15

## 2022-03-17 MED ORDER — DENOSUMAB 120 MG/1.7ML ~~LOC~~ SOLN
120.0000 mg | Freq: Once | SUBCUTANEOUS | Status: AC
  Administered 2022-03-17: 120 mg via SUBCUTANEOUS
  Filled 2022-03-17: qty 1.7

## 2022-03-17 MED ORDER — CEPHALEXIN 500 MG PO CAPS: 500.0000 mg | ORAL_CAPSULE | Freq: Two times a day (BID) | ORAL | 0 refills | Status: AC

## 2022-03-17 NOTE — Progress Notes (Signed)
[place], [place] [address]   CLINIC:  Medical Oncology/Hematology  PCP:  [name], MD [address] / [place] [address] [phone]   REASON FOR VISIT:  Follow-up for IgA kappa plasma cell myeloma  PRIOR THERAPY: Velcade x 9 cycles from 07/04/2019 to 01/10/2020  CURRENT THERAPY: Pomalidomide, daratumumab and dexamethasone  BRIEF ONCOLOGIC HISTORY:  Oncology History  Multiple myeloma not having achieved remission ([place])  06/07/2019 Initial Diagnosis   Multiple myeloma not having achieved remission ([place])   07/04/2019 - 01/10/2020 Chemotherapy   The patient had dexamethasone (DECADRON) 4 MG tablet, 1 of 1 cycle, Start date: 01/10/2020, End date: -- lenalidomide (REVLIMID) 15 MG capsule, 1 of 1 cycle, Start date: 02/26/2020, End date: -- bortezomib SQ (VELCADE) chemo injection 2.25 mg, 1.3 mg/m2 = 2.25 mg, Subcutaneous,  Once, 9 of 10 cycles Administration: 2.25 mg (07/04/2019), 2.25 mg (07/11/2019), 2.25 mg (07/26/2019), 2.25 mg (08/02/2019), 2.25 mg (08/09/2019), 2.25 mg (08/16/2019), 2.25 mg (08/23/2019), 2.25 mg (08/30/2019), 2.25 mg (09/06/2019), 2.25 mg (09/13/2019), 2.25 mg (09/20/2019), 2.25 mg (09/27/2019), 2.25 mg (10/04/2019), 2.25 mg (10/11/2019), 2.25 mg (10/18/2019), 2.25 mg (10/25/2019), 2.25 mg (11/01/2019), 2.25 mg (11/08/2019), 2.25 mg (11/15/2019), 2.25 mg (11/22/2019), 2.25 mg (11/29/2019), 2.25 mg (12/06/2019), 2.25 mg (12/13/2019), 2.25 mg (12/20/2019), 2.25 mg (12/27/2019), 2.25 mg (01/03/2020), 2.25 mg (01/10/2020)  for chemotherapy treatment.    09/15/2021 - 11/04/2021 Chemotherapy   Patient is on Treatment Plan : MYELOMA RELAPSED REFRACTORY Daratumumab SQ + Pomalidomide + Dexamethasone (DaraPd) q28d     09/15/2021 -  Chemotherapy   Patient is on Treatment Plan : MYELOMA RELAPSED REFRACTORY Daratumumab SQ + Pomalidomide + Dexamethasone (DaraPd) q28d       CANCER STAGING:  Cancer Staging  No matching staging information was found for the  patient.  INTERVAL HISTORY:  [name], a 82 y.o. female, seen for follow-up of multiple myeloma on toxicity assessment prior to next dose of daratumumab.  She denies any nosebleeds.  However she reported a lesion on the nose which developed last Friday on 03/12/2022.  She denies any pain in the right nostril area or the septum.  Denies any major discharge.  Reports that she is able to breathe well through the nose.  No congestion reported.  According to the daughter, she sleeps all day on the days she does not take dexamethasone.  Ritalin 5 mg did not help with fatigue.  She is off of Pomalyst as of today as she completed last pill on Monday.  Chronic headaches are stable.  REVIEW OF SYSTEMS:  Review of Systems  Constitutional:  Negative for appetite change and fatigue.  HENT:   Negative for nosebleeds.   Respiratory:  Negative for cough.   Gastrointestinal:  Negative for blood in stool.  Neurological:  Positive for headaches.  All other systems reviewed and are negative.   PAST MEDICAL/SURGICAL HISTORY:  Past Medical History:  Diagnosis Date   Atrial fibrillation ([place]) 2011   Postop, spontaneous conversion to normal sinus after one hour   CAP (community acquired pneumonia) 09/23/2021   Cholecystitis, acute 04/25/2015   Compression fracture 07/24/2009   T12; kyphoplasty   History of echocardiogram 07/2009   EF 65%   Hypertension    Thyroid disease    Tobacco abuse    Past Surgical History:  Procedure Laterality Date   BACK SURGERY     BACK SURGERY  06/06/2015   BREAST EXCISIONAL BIOPSY Left    50 years  ago  benign   CHOLECYSTECTOMY N/A 04/25/2015   Procedure: LAPAROSCOPIC CHOLECYSTECTOMY WITH INTRAOPERATIVE CHOLANGIOGRAM;  Surgeon: Mickeal Skinner, MD;  Location: [facility];  Service: General;  Laterality: N/A;   COLONOSCOPY N/A 11/26/2015   Procedure: COLONOSCOPY;  Surgeon: Daneil Dolin, MD;  [facility]: [facility];  Service: Endoscopy;  Laterality: N/A;  7:30 am    ERCP N/A 04/14/2015   Procedure: ENDOSCOPIC RETROGRADE CHOLANGIOPANCREATOGRAPHY (ERCP) Biliary Sphincterotomy, 10x7 stent placement Dilated bilary system just not well seen;  Surgeon: Rogene Houston, MD;  Location: [facility];  Service: Endoscopy;  Laterality: N/A;   ERCP N/A 06/12/2015   Procedure: ENDOSCOPIC RETROGRADE CHOLANGIOPANCREATOGRAPHY (ERCP);  Surgeon: Rogene Houston, MD;  [facility];  Service: Endoscopy;  Laterality: N/A;   ESOPHAGOGASTRODUODENOSCOPY N/A 06/12/2015   Procedure: DIAGNOSTIC ESOPHAGOGASTRODUODENOSCOPY (EGD);  Surgeon: Rogene Houston, MD;  [facility];  Service: Endoscopy;  Laterality: N/A;   FEMUR IM NAIL Right 05/11/2019   Procedure: RETROGRADE INTRAMEDULLARY NAIL FEMORAL;  Surgeon: Shona Needles, MD;  Location: [facility];  Service: Orthopedics;  Laterality: Right;   STENT REMOVAL  06/12/2015   Procedure: STENT REMOVAL ;  Surgeon: Rogene Houston, MD;  [facility];  Service: Endoscopy;;    SOCIAL HISTORY:  Social History   Socioeconomic History   Marital status: Widowed    Spouse name: Not on file   Number of children: 2   Years of education: Not on file   Highest education level: Not on file  Occupational History   Occupation: retired  Tobacco Use   Smoking status: Former    Packs/day: 0.15    Years: 20.00    Total pack years: 3.00    Types: Cigarettes    Quit date: 03/08/2013    Years since quitting: 9.0   Smokeless tobacco: Never  Vaping Use   Vaping Use: Never used  Substance and Sexual Activity   Alcohol use: No    Alcohol/week: 0.0 standard drinks of alcohol   Drug use: No   Sexual activity: Not Currently  Other Topics Concern   Not on file  Social History Narrative   Active in gardens and does yard work.   Social Determinants of Health   Financial Resource Strain: Low Risk  (05/08/2019)   Overall Financial Resource Strain (CARDIA)    Difficulty of Paying Living Expenses: Not hard at all  Food Insecurity: No Food  Insecurity (01/14/2022)   Hunger Vital Sign    Worried About Running Out of Food in the Last Year: Never true    Ran Out of Food in the Last Year: Never true  Transportation Needs: No Transportation Needs (01/14/2022)   PRAPARE - Hydrologist (Medical): No    Lack of Transportation (Non-Medical): No  Physical Activity: Inactive (05/08/2019)   Exercise Vital Sign    Days of Exercise per Week: 0 days    Minutes of Exercise per Session: 0 min  Stress: No Stress Concern Present (05/08/2019)   [place]    Feeling of Stress : Not at all  Social Connections: Moderately Isolated (02/06/2020)   Social Connection and Isolation Panel [NHANES]    Frequency of Communication with Friends and Family: More than three times a week    Frequency of Social Gatherings with Friends and Family: More than three times a week    Attends Religious Services: More than 4 times per year  Active Member of Clubs or Organizations: No    Attends Archivist Meetings: Never    Marital Status: Widowed  Intimate Partner Violence: Not At Risk (01/14/2022)   Humiliation, Afraid, Rape, and Kick questionnaire    Fear of Current or Ex-Partner: No    Emotionally Abused: No    Physically Abused: No    Sexually Abused: No    FAMILY HISTORY:  Family History  Problem Relation Age of Onset   Heart attack Father    Stroke Father    Breast cancer Sister    Thyroid disease Neg Hx    Colon cancer Neg Hx     CURRENT MEDICATIONS:  Current Outpatient Medications  Medication Sig Dispense Refill   acyclovir (ZOVIRAX) 400 MG tablet TAKE (1) TABLET BY MOUTH TWICE DAILY. (Patient taking differently: Take 400 mg by mouth 2 (two) times daily.) 60 tablet 6   CALCIUM PO Take 1 capsule by mouth daily.     carboxymethylcellulose (REFRESH PLUS) 0.5 % SOLN Place 1 drop into both eyes at bedtime.     Cholecalciferol (VITAMIN D) 125 MCG  (5000 UT) CAPS Take 5,000 Units by mouth daily. 90 capsule 1   Cyanocobalamin (B-12 PO) Take 1 capsule by mouth daily.     dexamethasone (DECADRON) 4 MG tablet Take 2.5 tablets (10 mg total) by mouth 2 (two) times a week. (Patient taking differently: Take 10 mg by mouth 2 (two) times a week. Takes Tuesday and Wednesday) 20 tablet 4   Ferrous Sulfate (IRON PO) Take 1 capsule by mouth daily.     levothyroxine (SYNTHROID) 137 MCG tablet Take 1 tablet (137 mcg total) by mouth daily before breakfast. 90 tablet 0   methylphenidate (RITALIN) 5 MG tablet Take 1 tablet (5 mg total) by mouth daily as needed. 30 tablet 0   montelukast (SINGULAIR) 10 MG tablet Take one tablet by mouth 1 hour prior to Darzalex injection appointments (Patient taking differently: Take one tablet by mouth 1 hour prior to Darzalex injection appointments - given every other Wednesday) 30 tablet 2   Multiple Vitamin (MULTIVITAMIN WITH MINERALS) TABS tablet Take 1 tablet by mouth daily.     ondansetron (ZOFRAN) 8 MG tablet Take 1 tablet (8 mg total) by mouth every 8 (eight) hours as needed for nausea or vomiting. 60 tablet 0   pomalidomide (POMALYST) 3 MG capsule Take 1 capsule (3 mg total) by mouth daily. Take for 21 days, then hold for 7 days. Repeat every 28 days. 21 capsule 0   potassium chloride (KLOR-CON M) 10 MEQ tablet Take 2 tablets (20 mEq total) by mouth daily. 60 tablet 3   No current facility-administered medications for this visit.   Facility-Administered Medications Ordered in Other Visits  Medication Dose Route Frequency Provider Last Rate Last Admin   0.9 %  sodium chloride infusion (Manually program via Guardrails IV Fluids)  250 mL Intravenous Once Derek Jack, MD       0.9 %  sodium chloride infusion   Intravenous Continuous Derek Jack, MD   Stopped at 09/15/21 1454    ALLERGIES:  No Known Allergies  PHYSICAL EXAM:  Performance status (ECOG): 1 - Symptomatic but completely  ambulatory  There were no vitals filed for this visit.   Wt Readings from Last 3 Encounters:  03/04/22 133 lb 3.2 oz (60.4 kg)  02/03/22 125 lb 6.4 oz (56.9 kg)  01/20/22 132 lb (59.9 kg)   Physical Exam Vitals reviewed.  Constitutional:  Appearance: Normal appearance.  Cardiovascular:     Rate and Rhythm: Normal rate and regular rhythm.     Pulses: Normal pulses.     Heart sounds: Normal heart sounds.  Pulmonary:     Effort: Pulmonary effort is normal.     Breath sounds: Normal breath sounds.  Neurological:     General: No focal deficit present.     Mental Status: She is alert and oriented to person, place, and time.  Psychiatric:        Mood and Affect: Mood normal.        Behavior: Behavior normal.  Blood in the back of the throat.  LABORATORY DATA:  I have reviewed the labs as listed.     Latest Ref Rng & Units 03/10/2022    1:01 PM 03/04/2022   10:15 AM 02/24/2022   12:24 PM  CBC  WBC 4.0 - 10.5 K/uL 0.9  1.7  1.5   Hemoglobin 12.0 - 15.0 g/dL 9.1  10.8  6.4   Hematocrit 36.0 - 46.0 % 28.1  32.8  19.6   Platelets 150 - 400 K/uL 46  46  72       Latest Ref Rng & Units 03/04/2022   10:15 AM 02/24/2022   12:24 PM 02/17/2022    8:00 AM  CMP  Glucose 70 - 99 mg/dL 110  99  129   BUN 8 - 23 mg/dL 32  34  48   Creatinine 0.44 - 1.00 mg/dL 0.82  1.09  1.15   Sodium 135 - 145 mmol/L 141  138  138   Potassium 3.5 - 5.1 mmol/L 2.9  3.2  3.0   Chloride 98 - 111 mmol/L 102  98  97   CO2 22 - 32 mmol/L '30  29  27   '$ Calcium 8.9 - 10.3 mg/dL 8.3  8.1  8.6   Total Protein 6.5 - 8.1 g/dL 6.0  6.2  6.2   Total Bilirubin 0.3 - 1.2 mg/dL 0.4  0.7  0.6   Alkaline Phos 38 - 126 U/L 110  73  94   AST 15 - 41 U/L '25  16  17   '$ ALT 0 - 44 U/L 38  14  20     DIAGNOSTIC IMAGING:  I have independently reviewed the scans and discussed with the patient. No results found.   ASSESSMENT:  1.  IgA kappa plasma cell myeloma: -IgA kappa plasma cell myeloma diagnosed on right  sacral bone biopsy on 05/17/2019.  SPEP did not show M spike.  Immunofixation shows IgA kappa.  Kappa light chains 42.8, lambda light chains at 9.9, ratio 4.32.  Beta-2 microglobulin 3.7. -PET scan on 06/04/2019 showed expansile lytic lesion involving the right sacral wing.  Lytic lesion involving T10 vertebral body.  Left seventh rib lesion.  Increased uptake in a pathological fracture involving distal aspect of the right femur. 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 -RVD started on 06/26/2019.  Revlimid 15 mg 2 weeks on/1 week off. -Velcade discontinued on 01/10/2020. -She is taking Revlimid 15 mg 2 weeks on 1 week off, with dexamethasone 20 mg on weeks of Revlimid and 10 mg while she is off Revlimid.  Revlimid dose further reduced to 10 mg 3 weeks on/1 week off.  She has developed progressive anemia. - BMBX (08/07/2021): 38% plasma cells in the marrow.  Cytogenetics was normal.  FISH studies showed monosomy 13 and  duplication of 1 q. - DPD regimen started on, single agent Darzalex and dexamethasone started on 09/15/2021.  Daratumumab held after first treatment due to pneumonia. - Pomalidomide 2 mg 3 weeks on/1 week off and dexamethasone 20 mg weekly started on 10/06/2021.  Daratumumab added on 10/21/2021.  Pomalidomide dose increased to 3 mg 3 weeks on/1 week off on 02/23/2022.   PLAN:  1.  Relapsed IgA kappa plasma cell myeloma: - Myeloma panel on 02/17/2022 showed M spike improved to 0.4 g from 0.7 g previously.  Free light chain ratio is 15.5, up from 11.67. - We have increased pomalidomide to 3 mg 3 weeks on/1 week off.  She completed last Monday.  She will start next pomalidomide cycle on 03/23/2022. - I have reviewed her nose, which showed ulcerated area in the right nostril towards the septum.  Nontender.  Not bleeding but there is a bruise at the tip of the nose.  Right nasal turbinate is swollen and nostril is closed. - I have  recommended starting Keflex 500 mg twice daily for 5 days.  If there is no improvement, will consider ENT referral. - She will receive day 15 dose today.  Next cycle will start on 03/31/2022.  I will see her back in 3 weeks with repeat myeloma labs.  If no improvement, will consider bone marrow biopsy.   2.  Hypokalemia: - Potassium is 4.7.  Continue potassium 20 mEq daily.   3.  Normocytic anemia: - Bone marrow infiltration and myelosuppression. - Hemoglobin today is 8.4 and platelet count 44. - If no improvement in myeloma labs at next cycle, will consider repeating bone marrow biopsy.   4.  ID prophylaxis: - Continue acyclovir twice daily.  Aspirin on hold..   5.  Myeloma bone disease: - Calcium is 8.0.  Continue denosumab monthly.  6.  Fatigue: - She has fatigue on the days she does not take dexamethasone.  I have told her to spread out dexamethasone to 10 mg on Monday and 10 mg on Wednesday. - Ritalin 5 mg did not help.  She may increase Ritalin to 10 mg on the days she feels fatigued.   Orders placed this encounter:  No orders of the defined types were placed in this encounter.    Derek Jack, MD [facility] [phone]

## 2022-03-17 NOTE — Progress Notes (Signed)
Patient presents today for Daratumumab per providers order.  MD is aware of white count and no diff results.  Message received from Anastasio Champion RN/Dr. Delton Coombes patient is okay for treatment.  Patient will also receive Xgeva injection with treatment.  Patient has been taking her Calcium and Vitamin D supplements, has had no jaw pain and no major dental work prior or upcoming.   Stable during administration without incident; injection site WNL; see MAR for injection details.  Patient tolerated procedure well and without incident.  No questions or complaints noted at this time. Discharge from clinic ambulatory in stable condition.  Alert and oriented X 3.  Follow up with [facility] as scheduled.

## 2022-03-17 NOTE — Progress Notes (Signed)
Patient is taking Pomalyst as prescribed.  She has not missed any doses and reports no side effects at this time.  Patient has been examined by Dr. Delton Coombes, and vital signs and labs have been reviewed. Total WBC (1.4), Creatinine, LFTs, hemoglobin, and platelets are within treatment parameters per M.D. - pt may proceed with treatment.  Primary RN and pharmacy notified.

## 2022-03-17 NOTE — Patient Instructions (Signed)
[facility]  Discharge Instructions: Thank you for choosing [facility] to provide your oncology and hematology care.  If you have a lab appointment with the [facility], please come in thru the Main Entrance and check in at the main information desk.  Wear comfortable clothing and clothing appropriate for easy access to any Portacath or PICC line.   We strive to give you quality time with your provider. You may need to reschedule your appointment if you arrive late (15 or more minutes).  Arriving late affects you and other patients whose appointments are after yours.  Also, if you miss three or more appointments without notifying the office, you may be dismissed from the clinic at the provider's discretion.      For prescription refill requests, have your pharmacy contact our office and allow 72 hours for refills to be completed.    Today you received the following chemotherapy and/or immunotherapy agents Xgeva/ Daratumumab      To help prevent nausea and vomiting after your treatment, we encourage you to take your nausea medication as directed.  BELOW ARE SYMPTOMS THAT SHOULD BE REPORTED IMMEDIATELY: *FEVER GREATER THAN 100.4 F (38 C) OR HIGHER *CHILLS OR SWEATING *NAUSEA AND VOMITING THAT IS NOT CONTROLLED WITH YOUR NAUSEA MEDICATION *UNUSUAL SHORTNESS OF BREATH *UNUSUAL BRUISING OR BLEEDING *URINARY PROBLEMS (pain or burning when urinating, or frequent urination) *BOWEL PROBLEMS (unusual diarrhea, constipation, pain near the anus) TENDERNESS IN MOUTH AND THROAT WITH OR WITHOUT PRESENCE OF ULCERS (sore throat, sores in mouth, or a toothache) UNUSUAL RASH, SWELLING OR PAIN  UNUSUAL VAGINAL DISCHARGE OR ITCHING   Items with * indicate a potential emergency and should be followed up as soon as possible or go to the Emergency Department if any problems should occur.  Please show the CHEMOTHERAPY ALERT CARD or IMMUNOTHERAPY ALERT CARD at check-in to  the Emergency Department and triage nurse.  Should you have questions after your visit or need to cancel or reschedule your appointment, please contact [facility] [phone]  and follow the prompts.  Office hours are 8:00 a.m. to 4:30 p.m. Monday - Friday. Please note that voicemails left after 4:00 p.m. may not be returned until the following business day.  We are closed weekends and major holidays. You have access to a nurse at all times for urgent questions. Please call the main number to the clinic [phone] and follow the prompts.  For any non-urgent questions, you may also contact your provider using MyChart. We now offer e-Visits for anyone 48 and older to request care online for non-urgent symptoms. For details visit [email or web address].   Also download the MyChart app! Go to the app store, search "MyChart", open the app, select [facility], and log in with your MyChart username and password.

## 2022-03-17 NOTE — Patient Instructions (Addendum)
[place] at [facility] Discharge Instructions   You were seen and examined today by Dr. Delton Coombes.  He looked at your nose. Your right nostril seems to be completely closed. We will refer you back to [name] to have this examined.   Increase the Ritalin to 2 pills every morning. Do not take on the days you take your steroids.  We sent Keflex to your pharmacy. This is an antibiotic that is good for skin infections. We will see if this helps with your nose.   Return as scheduled.      Thank you for choosing [place] at [facility] to provide your oncology and hematology care.  To afford each patient quality time with our provider, please arrive at least 15 minutes before your scheduled appointment time.   If you have a lab appointment with the [facility] please come in thru the Main Entrance and check in at the main information desk.  You need to re-schedule your appointment should you arrive 10 or more minutes late.  We strive to give you quality time with our providers, and arriving late affects you and other patients whose appointments are after yours.  Also, if you no show three or more times for appointments you may be dismissed from the clinic at the providers discretion.     Again, thank you for choosing [facility].  Our hope is that these requests will decrease the amount of time that you wait before being seen by our physicians.       _____________________________________________________________  Should you have questions after your visit to [facility], please contact our office at [phone] and follow the prompts.  Our office hours are 8:00 a.m. and 4:30 p.m. Monday - Friday.  Please note that voicemails left after 4:00 p.m. may not be returned until the following business day.  We are closed weekends and major holidays.  You do have access to a nurse 24-7, just call the main number to the  clinic [phone] and do not press any options, hold on the line and a nurse will answer the phone.    For prescription refill requests, have your pharmacy contact our office and allow 72 hours.    Due to Covid, you will need to wear a mask upon entering the hospital. If you do not have a mask, a mask will be given to you at the Main Entrance upon arrival. For doctor visits, patients may have 1 support person age 65 or older with them. For treatment visits, patients can not have anyone with them due to social distancing guidelines and our immunocompromised population.

## 2022-03-17 NOTE — Progress Notes (Signed)
CRITICAL VALUE ALERT Critical value received:  WBC 1.4.  Date of notification:  03-17-2022 Time of notification: 3838 pm. Critical value read back:  Yes.   Nurse who received alert:  Forest Gleason RN.  MD notified time and response:  Dr. Delton Coombes @ 1414 pm by secure chat. No new orders received.

## 2022-03-18 ENCOUNTER — Ambulatory Visit: Payer: Medicare Other | Admitting: Hematology

## 2022-03-18 ENCOUNTER — Other Ambulatory Visit: Payer: Self-pay

## 2022-03-19 ENCOUNTER — Other Ambulatory Visit: Payer: Self-pay

## 2022-03-19 ENCOUNTER — Inpatient Hospital Stay: Payer: Medicare Other

## 2022-03-19 MED ORDER — TRAMADOL HCL 50 MG PO TABS: 50.0000 mg | ORAL_TABLET | Freq: Two times a day (BID) | ORAL | 0 refills | Status: AC | PRN

## 2022-03-19 NOTE — Progress Notes (Signed)
Patient's daughter, Vivien Rota, came to the [facility] today reporting n/v/d and abdominal pain after each dara injection. Patient's daughter reports that compazine and imodium help manage the n/v/d but there is no relief from the abdominal pain. Dr. Delton Coombes made aware and states that he will prescribe tramadol. I have called Vivien Rota and made her aware. Understanding verbalized.

## 2022-03-22 ENCOUNTER — Emergency Department (HOSPITAL_COMMUNITY): Payer: Medicare Other

## 2022-03-22 ENCOUNTER — Encounter (HOSPITAL_COMMUNITY): Payer: Self-pay

## 2022-03-22 ENCOUNTER — Inpatient Hospital Stay (HOSPITAL_COMMUNITY)
Admission: EM | Admit: 2022-03-22 | Discharge: 2022-04-08 | DRG: 177 | Disposition: E | Payer: Medicare Other | Attending: Internal Medicine | Admitting: Internal Medicine

## 2022-03-22 ENCOUNTER — Other Ambulatory Visit: Payer: Self-pay

## 2022-03-22 ENCOUNTER — Inpatient Hospital Stay (HOSPITAL_COMMUNITY): Payer: Medicare Other

## 2022-03-22 DIAGNOSIS — K319 Disease of stomach and duodenum, unspecified: Secondary | ICD-10-CM | POA: Diagnosis not present

## 2022-03-22 DIAGNOSIS — R609 Edema, unspecified: Secondary | ICD-10-CM | POA: Diagnosis not present

## 2022-03-22 DIAGNOSIS — R197 Diarrhea, unspecified: Secondary | ICD-10-CM | POA: Diagnosis not present

## 2022-03-22 DIAGNOSIS — J189 Pneumonia, unspecified organism: Secondary | ICD-10-CM | POA: Diagnosis not present

## 2022-03-22 DIAGNOSIS — R918 Other nonspecific abnormal finding of lung field: Secondary | ICD-10-CM | POA: Diagnosis present

## 2022-03-22 DIAGNOSIS — Z79899 Other long term (current) drug therapy: Secondary | ICD-10-CM

## 2022-03-22 DIAGNOSIS — K920 Hematemesis: Secondary | ICD-10-CM | POA: Diagnosis not present

## 2022-03-22 DIAGNOSIS — D539 Nutritional anemia, unspecified: Secondary | ICD-10-CM | POA: Diagnosis present

## 2022-03-22 DIAGNOSIS — E039 Hypothyroidism, unspecified: Secondary | ICD-10-CM | POA: Diagnosis present

## 2022-03-22 DIAGNOSIS — J95811 Postprocedural pneumothorax: Secondary | ICD-10-CM | POA: Diagnosis not present

## 2022-03-22 DIAGNOSIS — I10 Essential (primary) hypertension: Secondary | ICD-10-CM

## 2022-03-22 DIAGNOSIS — Z8249 Family history of ischemic heart disease and other diseases of the circulatory system: Secondary | ICD-10-CM

## 2022-03-22 DIAGNOSIS — J90 Pleural effusion, not elsewhere classified: Secondary | ICD-10-CM | POA: Insufficient documentation

## 2022-03-22 DIAGNOSIS — F419 Anxiety disorder, unspecified: Secondary | ICD-10-CM | POA: Diagnosis not present

## 2022-03-22 DIAGNOSIS — J9383 Other pneumothorax: Secondary | ICD-10-CM | POA: Diagnosis present

## 2022-03-22 DIAGNOSIS — D696 Thrombocytopenia, unspecified: Secondary | ICD-10-CM | POA: Diagnosis not present

## 2022-03-22 DIAGNOSIS — R112 Nausea with vomiting, unspecified: Secondary | ICD-10-CM | POA: Diagnosis not present

## 2022-03-22 DIAGNOSIS — J9601 Acute respiratory failure with hypoxia: Secondary | ICD-10-CM | POA: Diagnosis present

## 2022-03-22 DIAGNOSIS — J851 Abscess of lung with pneumonia: Secondary | ICD-10-CM | POA: Diagnosis present

## 2022-03-22 DIAGNOSIS — B3781 Candidal esophagitis: Secondary | ICD-10-CM | POA: Diagnosis not present

## 2022-03-22 DIAGNOSIS — I272 Pulmonary hypertension, unspecified: Secondary | ICD-10-CM | POA: Diagnosis present

## 2022-03-22 DIAGNOSIS — D61818 Other pancytopenia: Secondary | ICD-10-CM | POA: Diagnosis present

## 2022-03-22 DIAGNOSIS — K449 Diaphragmatic hernia without obstruction or gangrene: Secondary | ICD-10-CM | POA: Diagnosis present

## 2022-03-22 DIAGNOSIS — I9589 Other hypotension: Secondary | ICD-10-CM | POA: Diagnosis present

## 2022-03-22 DIAGNOSIS — E890 Postprocedural hypothyroidism: Secondary | ICD-10-CM | POA: Diagnosis present

## 2022-03-22 DIAGNOSIS — D649 Anemia, unspecified: Secondary | ICD-10-CM | POA: Diagnosis not present

## 2022-03-22 DIAGNOSIS — I82612 Acute embolism and thrombosis of superficial veins of left upper extremity: Secondary | ICD-10-CM | POA: Diagnosis not present

## 2022-03-22 DIAGNOSIS — R0600 Dyspnea, unspecified: Secondary | ICD-10-CM | POA: Diagnosis not present

## 2022-03-22 DIAGNOSIS — E876 Hypokalemia: Secondary | ICD-10-CM | POA: Diagnosis not present

## 2022-03-22 DIAGNOSIS — R600 Localized edema: Secondary | ICD-10-CM | POA: Diagnosis not present

## 2022-03-22 DIAGNOSIS — D84821 Immunodeficiency due to drugs: Secondary | ICD-10-CM | POA: Diagnosis present

## 2022-03-22 DIAGNOSIS — R339 Retention of urine, unspecified: Secondary | ICD-10-CM | POA: Diagnosis not present

## 2022-03-22 DIAGNOSIS — R54 Age-related physical debility: Secondary | ICD-10-CM | POA: Diagnosis present

## 2022-03-22 DIAGNOSIS — H9190 Unspecified hearing loss, unspecified ear: Secondary | ICD-10-CM | POA: Diagnosis present

## 2022-03-22 DIAGNOSIS — D638 Anemia in other chronic diseases classified elsewhere: Secondary | ICD-10-CM | POA: Diagnosis not present

## 2022-03-22 DIAGNOSIS — D6832 Hemorrhagic disorder due to extrinsic circulating anticoagulants: Secondary | ICD-10-CM | POA: Diagnosis not present

## 2022-03-22 DIAGNOSIS — Z7960 Long term (current) use of unspecified immunomodulators and immunosuppressants: Secondary | ICD-10-CM

## 2022-03-22 DIAGNOSIS — Z515 Encounter for palliative care: Secondary | ICD-10-CM

## 2022-03-22 DIAGNOSIS — Z9181 History of falling: Secondary | ICD-10-CM

## 2022-03-22 DIAGNOSIS — J440 Chronic obstructive pulmonary disease with acute lower respiratory infection: Secondary | ICD-10-CM | POA: Diagnosis present

## 2022-03-22 DIAGNOSIS — I480 Paroxysmal atrial fibrillation: Secondary | ICD-10-CM | POA: Diagnosis present

## 2022-03-22 DIAGNOSIS — E871 Hypo-osmolality and hyponatremia: Secondary | ICD-10-CM | POA: Diagnosis present

## 2022-03-22 DIAGNOSIS — Z66 Do not resuscitate: Secondary | ICD-10-CM | POA: Diagnosis present

## 2022-03-22 DIAGNOSIS — E0781 Sick-euthyroid syndrome: Secondary | ICD-10-CM | POA: Diagnosis present

## 2022-03-22 DIAGNOSIS — I4891 Unspecified atrial fibrillation: Secondary | ICD-10-CM

## 2022-03-22 DIAGNOSIS — N179 Acute kidney failure, unspecified: Secondary | ICD-10-CM | POA: Diagnosis not present

## 2022-03-22 DIAGNOSIS — C9002 Multiple myeloma in relapse: Secondary | ICD-10-CM | POA: Diagnosis present

## 2022-03-22 DIAGNOSIS — R627 Adult failure to thrive: Secondary | ICD-10-CM | POA: Diagnosis present

## 2022-03-22 DIAGNOSIS — E872 Acidosis, unspecified: Secondary | ICD-10-CM | POA: Diagnosis not present

## 2022-03-22 DIAGNOSIS — Z803 Family history of malignant neoplasm of breast: Secondary | ICD-10-CM

## 2022-03-22 DIAGNOSIS — M7989 Other specified soft tissue disorders: Secondary | ICD-10-CM | POA: Diagnosis not present

## 2022-03-22 DIAGNOSIS — K5730 Diverticulosis of large intestine without perforation or abscess without bleeding: Secondary | ICD-10-CM | POA: Diagnosis present

## 2022-03-22 DIAGNOSIS — R040 Epistaxis: Secondary | ICD-10-CM | POA: Diagnosis not present

## 2022-03-22 DIAGNOSIS — K3189 Other diseases of stomach and duodenum: Secondary | ICD-10-CM | POA: Diagnosis present

## 2022-03-22 DIAGNOSIS — J852 Abscess of lung without pneumonia: Secondary | ICD-10-CM | POA: Diagnosis present

## 2022-03-22 DIAGNOSIS — I468 Cardiac arrest due to other underlying condition: Secondary | ICD-10-CM | POA: Diagnosis not present

## 2022-03-22 DIAGNOSIS — Z87891 Personal history of nicotine dependence: Secondary | ICD-10-CM

## 2022-03-22 DIAGNOSIS — E8770 Fluid overload, unspecified: Secondary | ICD-10-CM | POA: Diagnosis not present

## 2022-03-22 DIAGNOSIS — J850 Gangrene and necrosis of lung: Secondary | ICD-10-CM | POA: Diagnosis present

## 2022-03-22 DIAGNOSIS — Z7952 Long term (current) use of systemic steroids: Secondary | ICD-10-CM

## 2022-03-22 DIAGNOSIS — J342 Deviated nasal septum: Secondary | ICD-10-CM | POA: Diagnosis present

## 2022-03-22 DIAGNOSIS — Z79890 Hormone replacement therapy (postmenopausal): Secondary | ICD-10-CM

## 2022-03-22 DIAGNOSIS — Z823 Family history of stroke: Secondary | ICD-10-CM

## 2022-03-22 DIAGNOSIS — T45515A Adverse effect of anticoagulants, initial encounter: Secondary | ICD-10-CM | POA: Diagnosis not present

## 2022-03-22 DIAGNOSIS — K219 Gastro-esophageal reflux disease without esophagitis: Secondary | ICD-10-CM | POA: Diagnosis present

## 2022-03-22 DIAGNOSIS — C9000 Multiple myeloma not having achieved remission: Secondary | ICD-10-CM | POA: Diagnosis not present

## 2022-03-22 DIAGNOSIS — Z9049 Acquired absence of other specified parts of digestive tract: Secondary | ICD-10-CM

## 2022-03-22 DIAGNOSIS — R0609 Other forms of dyspnea: Secondary | ICD-10-CM | POA: Diagnosis not present

## 2022-03-22 DIAGNOSIS — Z86010 Personal history of colonic polyps: Secondary | ICD-10-CM

## 2022-03-22 DIAGNOSIS — M47894 Other spondylosis, thoracic region: Secondary | ICD-10-CM | POA: Diagnosis present

## 2022-03-22 DIAGNOSIS — Z7189 Other specified counseling: Secondary | ICD-10-CM | POA: Diagnosis not present

## 2022-03-22 DIAGNOSIS — I2510 Atherosclerotic heart disease of native coronary artery without angina pectoris: Secondary | ICD-10-CM | POA: Diagnosis present

## 2022-03-22 DIAGNOSIS — Y842 Radiological procedure and radiotherapy as the cause of abnormal reaction of the patient, or of later complication, without mention of misadventure at the time of the procedure: Secondary | ICD-10-CM | POA: Diagnosis present

## 2022-03-22 LAB — LACTIC ACID, PLASMA
Lactic Acid, Venous: 1.9 mmol/L (ref 0.5–1.9)
Lactic Acid, Venous: 1.5 mmol/L (ref 0.5–1.9)

## 2022-03-22 LAB — COMPREHENSIVE METABOLIC PANEL
ALT: 18 U/L (ref 0–44)
AST: 15 U/L (ref 15–41)
Albumin: 2.7 g/dL — ABNORMAL LOW (ref 3.5–5.0)
Alkaline Phosphatase: 79 U/L (ref 38–126)
Anion gap: 14 (ref 5–15)
BUN: 26 mg/dL — ABNORMAL HIGH (ref 8–23)
CO2: 22 mmol/L (ref 22–32)
Calcium: 7.9 mg/dL — ABNORMAL LOW (ref 8.9–10.3)
Chloride: 95 mmol/L — ABNORMAL LOW (ref 98–111)
Creatinine, Ser: 0.83 mg/dL (ref 0.44–1.00)
GFR, Estimated: >60 mL/min (ref >60)
Glucose, Bld: 114 mg/dL — ABNORMAL HIGH (ref 70–99)
Potassium: 4.4 mmol/L (ref 3.5–5.1)
Sodium: 131 mmol/L — ABNORMAL LOW (ref 135–145)
Total Bilirubin: 1 mg/dL (ref 0.3–1.2)
Total Protein: 5.9 g/dL — ABNORMAL LOW (ref 6.5–8.1)

## 2022-03-22 LAB — CBC WITH DIFFERENTIAL/PLATELET
Abs Immature Granulocytes: 0.04 10*3/uL (ref 0.00–0.07)
Basophils Absolute: 0 10*3/uL (ref 0.0–0.1)
Basophils Relative: 1 %
Eosinophils Absolute: 0 10*3/uL (ref 0.0–0.5)
Eosinophils Relative: 0 %
HCT: 23.5 % — ABNORMAL LOW (ref 36.0–46.0)
Hemoglobin: 7.5 g/dL — ABNORMAL LOW (ref 12.0–15.0)
Immature Granulocytes: 2 %
Lymphocytes Relative: 18 %
Lymphs Abs: 0.4 10*3/uL — ABNORMAL LOW (ref 0.7–4.0)
MCH: 31.8 pg (ref 26.0–34.0)
MCHC: 31.9 g/dL (ref 30.0–36.0)
MCV: 99.6 fL (ref 80.0–100.0)
Monocytes Absolute: 0.5 10*3/uL (ref 0.1–1.0)
Monocytes Relative: 24 %
Neutro Abs: 1.2 10*3/uL — ABNORMAL LOW (ref 1.7–7.7)
Neutrophils Relative %: 55 %
Platelets: 90 10*3/uL — ABNORMAL LOW (ref 150–400)
RBC: 2.36 MIL/uL — ABNORMAL LOW (ref 3.87–5.11)
RDW: 19.9 % — ABNORMAL HIGH (ref 11.5–15.5)
WBC: 2.2 10*3/uL — ABNORMAL LOW (ref 4.0–10.5)
nRBC: 1.4 % — ABNORMAL HIGH (ref 0.0–0.2)

## 2022-03-22 LAB — ECHOCARDIOGRAM COMPLETE
Area-P 1/2: 5.06 cm2
S' Lateral: 2.4 cm

## 2022-03-22 LAB — URINALYSIS, ROUTINE W REFLEX MICROSCOPIC
Bilirubin Urine: NEGATIVE
Glucose, UA: NEGATIVE mg/dL
Hgb urine dipstick: NEGATIVE
Ketones, ur: 5 mg/dL — AB
Leukocytes,Ua: NEGATIVE
Nitrite: NEGATIVE
Protein, ur: NEGATIVE mg/dL
Specific Gravity, Urine: >1.046 — ABNORMAL HIGH (ref 1.005–1.030)
pH: 5 (ref 5.0–8.0)

## 2022-03-22 LAB — TROPONIN I (HIGH SENSITIVITY)
Troponin I (High Sensitivity): 10 ng/L (ref <18)
Troponin I (High Sensitivity): 10 ng/L (ref <18)

## 2022-03-22 LAB — PREPARE RBC (CROSSMATCH)

## 2022-03-22 LAB — MRSA NEXT GEN BY PCR, NASAL: MRSA by PCR Next Gen: NOT DETECTED

## 2022-03-22 LAB — MAGNESIUM: Magnesium: 2 mg/dL (ref 1.7–2.4)

## 2022-03-22 LAB — TSH: TSH: 5.614 u[IU]/mL — ABNORMAL HIGH (ref 0.350–4.500)

## 2022-03-22 LAB — LIPASE, BLOOD: Lipase: 21 U/L (ref 11–51)

## 2022-03-22 MED ORDER — ONDANSETRON HCL 4 MG/2ML IJ SOLN
4.0000 mg | Freq: Four times a day (QID) | INTRAMUSCULAR | Status: DC | PRN
  Administered 2022-03-24 – 2022-03-28 (×3): 4 mg via INTRAVENOUS
  Filled 2022-03-22 (×3): qty 2

## 2022-03-22 MED ORDER — SODIUM CHLORIDE 0.9 % IV SOLN: INTRAVENOUS | Status: DC | PRN

## 2022-03-22 MED ORDER — DIGOXIN 0.25 MG/ML IJ SOLN: 0.1250 mg | Freq: Four times a day (QID) | INTRAMUSCULAR | Status: DC

## 2022-03-22 MED ORDER — IPRATROPIUM-ALBUTEROL 0.5-2.5 (3) MG/3ML IN SOLN
3.0000 mL | Freq: Four times a day (QID) | RESPIRATORY_TRACT | Status: DC
  Administered 2022-03-22: 3 mL via RESPIRATORY_TRACT
  Filled 2022-03-22: qty 3

## 2022-03-22 MED ORDER — SODIUM CHLORIDE 0.9 % IV BOLUS
500.0000 mL | Freq: Once | INTRAVENOUS | Status: AC
  Administered 2022-03-22: 500 mL via INTRAVENOUS

## 2022-03-22 MED ORDER — SODIUM CHLORIDE 0.9% FLUSH
3.0000 mL | Freq: Two times a day (BID) | INTRAVENOUS | Status: DC
  Administered 2022-03-23 – 2022-04-04 (×13): 3 mL via INTRAVENOUS

## 2022-03-22 MED ORDER — SODIUM CHLORIDE 0.9 % IV SOLN
2.0000 g | Freq: Two times a day (BID) | INTRAVENOUS | Status: DC
  Administered 2022-03-22 – 2022-03-25 (×6): 2 g via INTRAVENOUS
  Filled 2022-03-22 (×6): qty 12.5

## 2022-03-22 MED ORDER — SODIUM CHLORIDE 0.9% IV SOLUTION: Freq: Once | INTRAVENOUS | Status: DC

## 2022-03-22 MED ORDER — DIGOXIN 0.25 MG/ML IJ SOLN: 0.5000 mg | Freq: Once | INTRAMUSCULAR | Status: DC

## 2022-03-22 MED ORDER — ACETAMINOPHEN 650 MG RE SUPP: 650.0000 mg | Freq: Four times a day (QID) | RECTAL | Status: DC | PRN

## 2022-03-22 MED ORDER — ENOXAPARIN SODIUM 60 MG/0.6ML IJ SOSY: 60.0000 mg | PREFILLED_SYRINGE | Freq: Two times a day (BID) | INTRAMUSCULAR | Status: DC

## 2022-03-22 MED ORDER — LEVALBUTEROL HCL 0.63 MG/3ML IN NEBU
0.6300 mg | INHALATION_SOLUTION | Freq: Four times a day (QID) | RESPIRATORY_TRACT | Status: DC
  Administered 2022-03-23 (×2): 0.63 mg via RESPIRATORY_TRACT
  Filled 2022-03-22 (×2): qty 3

## 2022-03-22 MED ORDER — FENTANYL CITRATE PF 50 MCG/ML IJ SOSY
50.0000 ug | PREFILLED_SYRINGE | Freq: Once | INTRAMUSCULAR | Status: AC
  Administered 2022-03-22: 50 ug via INTRAVENOUS
  Filled 2022-03-22: qty 1

## 2022-03-22 MED ORDER — POLYETHYLENE GLYCOL 3350 17 G PO PACK: 17.0000 g | PACK | Freq: Every day | ORAL | Status: DC | PRN

## 2022-03-22 MED ORDER — DILTIAZEM HCL-DEXTROSE 125-5 MG/125ML-% IV SOLN (PREMIX)
5.0000 mg/h | INTRAVENOUS | Status: DC
  Administered 2022-03-22: 5 mg/h via INTRAVENOUS
  Filled 2022-03-22: qty 125

## 2022-03-22 MED ORDER — TRAZODONE HCL 50 MG PO TABS
50.0000 mg | ORAL_TABLET | Freq: Every evening | ORAL | Status: DC | PRN
  Administered 2022-03-22 – 2022-04-01 (×6): 50 mg via ORAL
  Filled 2022-03-22 (×6): qty 1

## 2022-03-22 MED ORDER — ACYCLOVIR 400 MG PO TABS
400.0000 mg | ORAL_TABLET | Freq: Two times a day (BID) | ORAL | Status: DC
  Administered 2022-03-23 – 2022-04-01 (×19): 400 mg via ORAL
  Filled 2022-03-22 (×21): qty 1

## 2022-03-22 MED ORDER — ONDANSETRON HCL 4 MG PO TABS: 4.0000 mg | ORAL_TABLET | Freq: Four times a day (QID) | ORAL | Status: DC | PRN

## 2022-03-22 MED ORDER — DM-GUAIFENESIN ER 30-600 MG PO TB12
1.0000 | ORAL_TABLET | Freq: Two times a day (BID) | ORAL | Status: DC
  Administered 2022-03-22 – 2022-04-01 (×21): 1 via ORAL
  Filled 2022-03-22 (×22): qty 1

## 2022-03-22 MED ORDER — VANCOMYCIN HCL 1250 MG/250ML IV SOLN
1250.0000 mg | Freq: Once | INTRAVENOUS | Status: AC
  Administered 2022-03-22: 1250 mg via INTRAVENOUS
  Filled 2022-03-22: qty 250

## 2022-03-22 MED ORDER — ALBUTEROL SULFATE (2.5 MG/3ML) 0.083% IN NEBU: 2.5000 mg | INHALATION_SOLUTION | RESPIRATORY_TRACT | Status: DC | PRN

## 2022-03-22 MED ORDER — PIPERACILLIN-TAZOBACTAM 3.375 G IVPB: 3.3750 g | Freq: Three times a day (TID) | INTRAVENOUS | Status: DC

## 2022-03-22 MED ORDER — PANTOPRAZOLE SODIUM 40 MG PO TBEC
40.0000 mg | DELAYED_RELEASE_TABLET | Freq: Every day | ORAL | Status: DC
  Administered 2022-03-23 – 2022-03-24 (×2): 40 mg via ORAL
  Filled 2022-03-22 (×2): qty 1

## 2022-03-22 MED ORDER — SODIUM CHLORIDE 0.9 % IV SOLN: 1.0000 g | Freq: Once | INTRAVENOUS | Status: DC

## 2022-03-22 MED ORDER — LEVOTHYROXINE SODIUM 25 MCG PO TABS
137.0000 ug | ORAL_TABLET | Freq: Every day | ORAL | Status: DC
  Administered 2022-03-23 – 2022-04-02 (×11): 137 ug via ORAL
  Filled 2022-03-22 (×11): qty 1

## 2022-03-22 MED ORDER — SODIUM CHLORIDE 0.9% FLUSH
3.0000 mL | Freq: Two times a day (BID) | INTRAVENOUS | Status: DC
  Administered 2022-03-23 – 2022-04-03 (×17): 3 mL via INTRAVENOUS

## 2022-03-22 MED ORDER — METRONIDAZOLE 500 MG/100ML IV SOLN
500.0000 mg | Freq: Two times a day (BID) | INTRAVENOUS | Status: DC
  Administered 2022-03-22 – 2022-03-25 (×6): 500 mg via INTRAVENOUS
  Filled 2022-03-22 (×6): qty 100

## 2022-03-22 MED ORDER — AMIODARONE HCL IN DEXTROSE 360-4.14 MG/200ML-% IV SOLN
60.0000 mg/h | INTRAVENOUS | Status: AC
  Administered 2022-03-22: 60 mg/h via INTRAVENOUS
  Filled 2022-03-22 (×2): qty 200

## 2022-03-22 MED ORDER — AMIODARONE LOAD VIA INFUSION
150.0000 mg | Freq: Once | INTRAVENOUS | Status: AC
  Administered 2022-03-22: 150 mg via INTRAVENOUS
  Filled 2022-03-22: qty 83.34

## 2022-03-22 MED ORDER — SODIUM CHLORIDE 0.9 % IV SOLN: 500.0000 mg | Freq: Once | INTRAVENOUS | Status: DC

## 2022-03-22 MED ORDER — CARBOXYMETHYLCELLULOSE SODIUM 0.5 % OP SOLN: 1.0000 [drp] | Freq: Every day | OPHTHALMIC | Status: DC | PRN

## 2022-03-22 MED ORDER — SODIUM CHLORIDE 0.9% FLUSH: 3.0000 mL | INTRAVENOUS | Status: DC | PRN

## 2022-03-22 MED ORDER — SODIUM CHLORIDE 0.9 % IV SOLN
2.0000 g | Freq: Once | INTRAVENOUS | Status: AC
  Administered 2022-03-22: 2 g via INTRAVENOUS
  Filled 2022-03-22: qty 12.5

## 2022-03-22 MED ORDER — DILTIAZEM LOAD VIA INFUSION
10.0000 mg | Freq: Once | INTRAVENOUS | Status: DC
  Filled 2022-03-22: qty 10

## 2022-03-22 MED ORDER — TRAMADOL HCL 50 MG PO TABS
50.0000 mg | ORAL_TABLET | Freq: Two times a day (BID) | ORAL | Status: DC | PRN
  Administered 2022-03-22 – 2022-03-28 (×5): 50 mg via ORAL
  Filled 2022-03-22 (×6): qty 1

## 2022-03-22 MED ORDER — AMIODARONE HCL IN DEXTROSE 360-4.14 MG/200ML-% IV SOLN
30.0000 mg/h | INTRAVENOUS | Status: DC
  Administered 2022-03-23 (×3): 30 mg/h via INTRAVENOUS
  Filled 2022-03-22 (×2): qty 200

## 2022-03-22 MED ORDER — IOHEXOL 300 MG/ML  SOLN
100.0000 mL | Freq: Once | INTRAMUSCULAR | Status: AC | PRN
  Administered 2022-03-22: 100 mL via INTRAVENOUS

## 2022-03-22 MED ORDER — PANTOPRAZOLE SODIUM 40 MG IV SOLR
40.0000 mg | Freq: Once | INTRAVENOUS | Status: AC
  Administered 2022-03-22: 40 mg via INTRAVENOUS
  Filled 2022-03-22: qty 10

## 2022-03-22 MED ORDER — ONDANSETRON HCL 4 MG/2ML IJ SOLN
4.0000 mg | Freq: Once | INTRAMUSCULAR | Status: AC
  Administered 2022-03-22: 4 mg via INTRAVENOUS
  Filled 2022-03-22: qty 2

## 2022-03-22 MED ORDER — HEPARIN SODIUM (PORCINE) 5000 UNIT/ML IJ SOLN: 5000.0000 [IU] | Freq: Three times a day (TID) | INTRAMUSCULAR | Status: DC

## 2022-03-22 MED ORDER — VANCOMYCIN HCL 750 MG/150ML IV SOLN
750.0000 mg | INTRAVENOUS | Status: DC
  Filled 2022-03-22 (×2): qty 150

## 2022-03-22 MED ORDER — DIGOXIN 0.25 MG/ML IJ SOLN
0.5000 mg | Freq: Once | INTRAMUSCULAR | Status: DC
  Filled 2022-03-22: qty 2

## 2022-03-22 MED ORDER — POLYVINYL ALCOHOL 1.4 % OP SOLN: 1.0000 [drp] | OPHTHALMIC | Status: DC | PRN

## 2022-03-22 MED ORDER — DIGOXIN 125 MCG PO TABS: 0.1250 mg | ORAL_TABLET | Freq: Four times a day (QID) | ORAL | Status: DC

## 2022-03-22 MED ORDER — BISACODYL 10 MG RE SUPP: 10.0000 mg | Freq: Every day | RECTAL | Status: DC | PRN

## 2022-03-22 MED ORDER — ACETAMINOPHEN 325 MG PO TABS: 650.0000 mg | ORAL_TABLET | Freq: Four times a day (QID) | ORAL | Status: DC | PRN

## 2022-03-22 MED ORDER — PIPERACILLIN-TAZOBACTAM 3.375 G IVPB 30 MIN
3.3750 g | Freq: Once | INTRAVENOUS | Status: AC
  Administered 2022-03-22: 3.375 g via INTRAVENOUS
  Filled 2022-03-22: qty 50

## 2022-03-22 NOTE — ED Provider Notes (Signed)
[facility] EMERGENCY DEPARTMENT Provider Note   CSN: [id number] Arrival date & time: 03/24/2022  0847     History  Chief Complaint  Patient presents with   Abdominal Pain   HPI Courtney Arnold is a 82 y.o. female with multiple myeloma, atrial fibrillation, hypertension and history of cholecystectomy presenting for abdominal pain.  Abdominal pain started about 2 weeks ago.  States she is tender all over.  She has had 3 weeks of nausea and vomiting.  States her appetites been really poor she is only drinking [place] but cannot keep it down.  Denies fever or chills.  Denies diarrhea but still has been loose.  Denies hematemesis, hematochezia or melena and hematuria.  Daughter believes it to be related to new medication for her multiple myeloma which she started 6 months ago.  Medication is Pomalyst.  Daughter also mentioned that her skin appears more "yellow".  Taking weekly steroids.  Daughter states that she is not currently being treated for atrial fibrillation.   Abdominal Pain      Home Medications Prior to Admission medications   Medication Sig Start Date End Date Taking? Authorizing Provider  acyclovir (ZOVIRAX) 400 MG tablet TAKE (1) TABLET BY MOUTH TWICE DAILY. Patient taking differently: Take 400 mg by mouth 2 (two) times daily. 01/11/22  Yes Courtney Jack, MD  CALCIUM PO Take 1 capsule by mouth daily.   Yes [provider]  carboxymethylcellulose (REFRESH PLUS) 0.5 % SOLN Place 1 drop into both eyes at bedtime.   Yes [provider]  cephALEXin (KEFLEX) 500 MG capsule Take 1 capsule (500 mg total) by mouth 2 (two) times daily. 03/17/22  Yes Courtney Jack, MD  Cholecalciferol (VITAMIN D) 125 MCG (5000 UT) CAPS Take 5,000 Units by mouth daily. 05/14/19  Yes Arnold, Courtney A, PA-C  Cyanocobalamin (B-12 PO) Take 1 capsule by mouth daily.   Yes [provider]  dexamethasone (DECADRON) 4 MG tablet Take 2.5 tablets (10 mg total) by mouth 2  (two) times a week. 12/24/21  Yes Courtney Jack, MD  levothyroxine (SYNTHROID) 137 MCG tablet Take 1 tablet (137 mcg total) by mouth daily before breakfast. 02/16/22  Yes Arnold, Courtney Crazier, MD  methylphenidate (RITALIN) 5 MG tablet Take 10 mg by mouth daily. Only on days she is not taking dexamethasone tue,thu,fri,sat,sun   Yes [provider]  montelukast (SINGULAIR) 10 MG tablet Take one tablet by mouth 1 hour prior to Darzalex injection appointments Patient taking differently: Take one tablet by mouth 1 hour prior to Darzalex injection appointments - given every other Wednesday 10/28/21  Yes Courtney Jack, MD  Multiple Vitamin (MULTIVITAMIN WITH MINERALS) TABS tablet Take 1 tablet by mouth daily. 09/25/21  Yes Arnold, Courtney L, MD  pomalidomide (POMALYST) 3 MG capsule Take 1 capsule (3 mg total) by mouth daily. Take for 21 days, then hold for 7 days. Repeat every 28 days. 03/12/22  Yes Courtney Jack, MD  potassium chloride (KLOR-CON M) 10 MEQ tablet Take 2 tablets (20 mEq total) by mouth daily. 03/04/22  Yes Courtney Jack, MD  traMADol (ULTRAM) 50 MG tablet Take 1 tablet (50 mg total) by mouth every 12 (twelve) hours as needed. 03/19/22  Yes Courtney Jack, MD  ondansetron (ZOFRAN) 8 MG tablet Take 1 tablet (8 mg total) by mouth every 8 (eight) hours as needed for nausea or vomiting. Patient not taking: Reported on 03/17/2022 09/21/21   Courtney Jack, MD      Allergies    Patient has  no known allergies.    Review of Systems   Review of Systems  Gastrointestinal:  Positive for abdominal pain.    Physical Exam   Vitals:   03/11/2022 1606 03/13/2022 1622  BP:    Pulse:    Resp:    Temp: 98.1 F (36.7 C) 97.8 F (36.6 C)  SpO2:      CONSTITUTIONAL:  ill-appearing, NAD NEURO:  Alert and oriented x 3, CN 3-12 grossly intact EYES:  eyes equal and reactive ENT/NECK:  Supple, no stridor  CARDIO: tachycardic and irregular rhythm,  appears well-perfused  PULM:  No respiratory distress, CTAB GI/GU:  non-distended, soft, generalized TTP MSK/SPINE:  No gross deformities, no edema, moves all extremities  SKIN:  pale, jaundiced, no rash, atraumatic   *Additional and/or pertinent findings included in MDM below    ED Results / Procedures / Treatments   Labs (all labs ordered are listed, but only abnormal results are displayed) Labs Reviewed  CBC WITH DIFFERENTIAL/PLATELET - Abnormal; Notable for the following components:      Result Value   WBC 2.2 (*)    RBC 2.36 (*)    Hemoglobin 7.5 (*)    HCT 23.5 (*)    RDW 19.9 (*)    Platelets 90 (*)    nRBC 1.4 (*)    Neutro Abs 1.2 (*)    Lymphs Abs 0.4 (*)    All other components within normal limits  COMPREHENSIVE METABOLIC PANEL - Abnormal; Notable for the following components:   Sodium 131 (*)    Chloride 95 (*)    Glucose, Bld 114 (*)    BUN 26 (*)    Calcium 7.9 (*)    Total Protein 5.9 (*)    Albumin 2.7 (*)    All other components within normal limits  URINALYSIS, ROUTINE W REFLEX MICROSCOPIC - Abnormal; Notable for the following components:   Specific Gravity, Urine >1.046 (*)    Ketones, ur 5 (*)    All other components within normal limits  CULTURE, BLOOD (ROUTINE X 2)  MRSA NEXT GEN BY PCR, NASAL  CULTURE, BLOOD (ROUTINE X 2)  MRSA NEXT GEN BY PCR, NASAL  LIPASE, BLOOD  LACTIC ACID, PLASMA  LACTIC ACID, PLASMA  MAGNESIUM  AMYLASE  LIPASE, BLOOD  TYPE AND SCREEN  PREPARE RBC (CROSSMATCH)  TROPONIN I (HIGH SENSITIVITY)  TROPONIN I (HIGH SENSITIVITY)    EKG None  Radiology ECHOCARDIOGRAM COMPLETE  Result Date: 03/20/2022    ECHOCARDIOGRAM REPORT   Patient Name:   Courtney Arnold Date of Exam: 04/04/2022 Medical Rec #:  [id number]        Height:       62.0 in Accession #:    [id number]       Weight:       126.3 lb Date of Birth:  [birth year]        BSA:          1.572 m Patient Age:    45 years         BP:           82/52 mmHg Patient  Gender: F                HR:           124 bpm. Exam Location:  [facility] Procedure: 2D Echo, Cardiac Doppler and Color Doppler Indications:    Atrial Fibrillation I48.91  History:        Patient has no prior history of  Echocardiogram examinations.                 Arrythmias:Atrial Fibrillation; Risk Factors:Former Smoker and                 Hypertension.  Sonographer:    Greer Pickerel Referring Phys: [id number] VISHNU P MALLIPEDDI  Sonographer Comments: Suboptimal subcostal window. Image acquisition challenging due to respiratory motion and Image acquisition challenging due to uncooperative patient. IMPRESSIONS  1. Left ventricular ejection fraction, by estimation, is 55 to 60%. The left ventricle has normal function. Left ventricular endocardial border not optimally defined to evaluate regional wall motion. Left ventricular diastolic function could not be evaluated.  2. Right ventricular systolic function is normal. The right ventricular size is mildly enlarged. There is moderately elevated pulmonary artery systolic pressure. The estimated right ventricular systolic pressure is 22.9 mmHg.  3. Left atrial size was severely dilated.  4. Right atrial size was severely dilated.  5. The mitral valve is abnormal. Mild mitral valve regurgitation. No evidence of mitral stenosis.  6. Tricuspid valve regurgitation is mild to moderate.  7. The aortic valve was not well visualized. Aortic valve regurgitation is trivial. No aortic stenosis is present.  8. The inferior vena cava is normal in size with <50% respiratory variability, suggesting right atrial pressure of 8 mmHg. Comparison(s): No prior Echocardiogram. FINDINGS  Left Ventricle: Left ventricular ejection fraction, by estimation, is 55 to 60%. The left ventricle has normal function. Left ventricular endocardial border not optimally defined to evaluate regional wall motion. The left ventricular internal cavity size was normal in size. There is no left ventricular  hypertrophy. Left ventricular diastolic function could not be evaluated due to atrial fibrillation. Left ventricular diastolic function could not be evaluated. Right Ventricle: The right ventricular size is mildly enlarged. No increase in right ventricular wall thickness. Right ventricular systolic function is normal. There is moderately elevated pulmonary artery systolic pressure. The tricuspid regurgitant velocity is 3.24 m/s, and with an assumed right atrial pressure of 8 mmHg, the estimated right ventricular systolic pressure is 79.8 mmHg. Left Atrium: Left atrial size was severely dilated. Right Atrium: Right atrial size was severely dilated. Pericardium: There is no evidence of pericardial effusion. Mitral Valve: The mitral valve is abnormal. Mild to moderate mitral annular calcification. Mild mitral valve regurgitation. No evidence of mitral valve stenosis. Tricuspid Valve: The tricuspid valve is normal in structure. Tricuspid valve regurgitation is mild to moderate. No evidence of tricuspid stenosis. Aortic Valve: The aortic valve was not well visualized. Aortic valve regurgitation is trivial. No aortic stenosis is present. Pulmonic Valve: The pulmonic valve was normal in structure. Pulmonic valve regurgitation is not visualized. No evidence of pulmonic stenosis. Aorta: The aortic root and ascending aorta are structurally normal, with no evidence of dilitation. Venous: The inferior vena cava is normal in size with less than 50% respiratory variability, suggesting right atrial pressure of 8 mmHg. IAS/Shunts: The interatrial septum was not well visualized.  LEFT VENTRICLE PLAX 2D LVIDd:         3.50 cm   Diastology LVIDs:         2.40 cm   LV e' medial:    9.30 cm/s LV PW:         0.80 cm   LV E/e' medial:  13.0 LV IVS:        0.70 cm   LV e' lateral:   10.80 cm/s LVOT diam:     1.90 cm   LV E/e' lateral:  11.2 LV SV:         31 LV SV Index:   20 LVOT Area:     2.84 cm  RIGHT VENTRICLE RV S prime:     11.30  cm/s TAPSE (M-mode): 1.4 cm LEFT ATRIUM             Index        RIGHT ATRIUM           Index LA diam:        3.90 cm 2.48 cm/m   RA Area:     22.10 cm LA Vol (A2C):   75.4 ml 47.95 ml/m  RA Volume:   69.80 ml  44.39 ml/m LA Vol (A4C):   72.7 ml 46.23 ml/m LA Biplane Vol: 74.8 ml 47.57 ml/m  AORTIC VALVE LVOT Vmax:   84.90 cm/s LVOT Vmean:  59.500 cm/s LVOT VTI:    0.111 m  AORTA Ao Root diam: 3.20 cm Ao Asc diam:  3.70 cm MITRAL VALVE                TRICUSPID VALVE MV Area (PHT): 5.06 cm     TR Peak grad:   42.0 mmHg MV Decel Time: 150 msec     TR Vmax:        324.00 cm/s MV E velocity: 121.00 cm/s                             SHUNTS                             Systemic VTI:  0.11 m                             Systemic Diam: 1.90 cm Vishnu Priya Mallipeddi Electronically signed by Lorelee Cover Mallipeddi Signature Date/Time: 03/19/2022/4:22:37 PM    Final    CT Chest Wo Contrast  Result Date: 03/11/2022 CLINICAL DATA:  Right middle lobe abnormality noted on same day CT abdomen and pelvis EXAM: CT CHEST WITHOUT CONTRAST TECHNIQUE: Multidetector CT imaging of the chest was performed following the standard protocol without IV contrast. RADIATION DOSE REDUCTION: This exam was performed according to the departmental dose-optimization program which includes automated exposure control, adjustment of the mA and/or kV according to patient size and/or use of iterative reconstruction technique. COMPARISON:  CT chest dated 09/27/2021 FINDINGS: Cardiovascular: Normal heart size. No significant pericardial fluid/thickening. Coronary artery calcifications and aortic atherosclerosis. Great vessels are normal in course and caliber. Mediastinum/Nodes: Imaged thyroid gland without nodules meeting criteria for imaging follow-up by size. Normal esophagus. No pathologically enlarged axillary, supraclavicular, mediastinal, or hilar lymph nodes. Lungs/Pleura: The central airways are patent. Near-complete resolution of previously  noted left upper lobe consolidation. Interval development of multifocal new nodules and consolidation, predominantly involving the right middle lobe, where there is a gas-containing fluid collection measuring 2.2 x 1.6 cm in the base of the right middle lobe (2:87). No focal consolidation. No pneumothorax. Moderate right and trace left pleural effusions. Upper abdomen: Partially imaged bilateral renal cysts. Musculoskeletal: Diffusely heterogeneous appearance of the skeleton in keeping with known multiple myeloma. Prior vertebral augmentations of T6, T7 and T12 with additional compression deformities of T9 and T10. Similar sclerotic appearance of the left posterolateral seventh rib IMPRESSION: 1. Interval development of multifocal new nodules and consolidation, predominantly involving the right middle lobe,  where there is a gas-containing fluid collection measuring 2.2 x 1.6 cm in the base of the right middle lobe. Findings are most consistent with multifocal pneumonia with abscess formation. 2. Near-complete resolution of previously noted left upper lobe consolidation. 3. Moderate right and trace left pleural effusions. 4. Diffusely heterogeneous appearance of the skeleton in keeping with known multiple myeloma. 5. Coronary artery calcifications. Aortic Atherosclerosis (ICD10-I70.0). Electronically Signed   By: Darrin Nipper M.D.   On: 04/06/2022 11:43   CT Abdomen Pelvis W Contrast  Result Date: 03/15/2022 CLINICAL DATA:  Acute abdominal pain EXAM: CT ABDOMEN AND PELVIS WITH CONTRAST TECHNIQUE: Multidetector CT imaging of the abdomen and pelvis was performed using the standard protocol following bolus administration of intravenous contrast. RADIATION DOSE REDUCTION: This exam was performed according to the departmental dose-optimization program which includes automated exposure control, adjustment of the mA and/or kV according to patient size and/or use of iterative reconstruction technique. CONTRAST:  169m  OMNIPAQUE IOHEXOL 300 MG/ML  SOLN COMPARISON:  CT scan of the chest 09/27/2021; CT scan of the abdomen and pelvis 09/22/2021 FINDINGS: Lower chest: Partially imaged ill-defined masslike density versus consolidation in the anterior inferior right middle lobe measuring at least 5.6 x 3.1 cm. Moderate layering right pleural effusion. Trace left pleural effusion. Dependent atelectasis. Cardiomegaly. No pericardial effusion. Small hiatal hernia with esophageal wall thickening. Hepatobiliary: Normal hepatic contour and morphology. No discrete hepatic lesion. The gallbladder is surgically absent. Similar degree of mild intra and extra hepatic biliary ductal dilatation. The main bile duct measures 8 mm at the pancreatic head. Pancreas: Unremarkable. No pancreatic ductal dilatation or surrounding inflammatory changes. Spleen: Normal in size without focal abnormality. Adrenals/Urinary Tract: Normal adrenal glands. No hydronephrosis, nephrolithiasis or enhancing renal mass. Simple renal cysts are noted. No imaging follow-up recommended. 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 No evidence of obstruction or focal bowel wall thickening. Normal appendix in the right lower quadrant. The terminal ileum is unremarkable. Vascular/Lymphatic: Atherosclerotic calcifications throughout the abdominal aorta. No evidence of aneurysm or dissection. No evidence of significant arterial stenosis or occlusion. No focal venous abnormality. No suspicious lymphadenopathy. Reproductive: Uterus and bilateral adnexa are unremarkable. Other: No abdominal wall hernia or abnormality. No abdominopelvic ascites. Musculoskeletal: No evidence of acute fracture or malalignment. Multiple thoracic and lumbar compression fractures are again noted without interval change. Evidence of prior cement augmentation at T12. IMPRESSION: 1. Partially imaged ill-defined masslike density versus consolidation in the anterior  inferior right middle lobe measuring 5.6 x 3.1 cm. Differential considerations include necrotic pneumonia versus primary bronchogenic carcinoma. Recommend further evaluation with CT scan of the chest. 2. Moderate layering right pleural effusion and trace left pleural effusion. 3. Cardiomegaly. 4. Small hiatal hernia with circumferential esophageal wall thickening. This may reflect GERD, or less likely esophageal neoplasm. 5. Colonic diverticular disease without CT evidence of active inflammation. 6. Scattered atherosclerotic vascular calcifications without aneurysm, dissection or significant vascular occlusion. 7. Stable appearance of multiple thoracic and lumbar compression fractures including prior cement augmentation at T12. Aortic Atherosclerosis (ICD10-I70.0). Electronically Signed   By: HJacqulynn CadetM.D.   On: 03/13/2022 11:06    Procedures .Critical Care  Performed by: RHarriet Pho PA-C Authorized by: RHarriet Pho PA-C   Critical care provider statement:    Critical care time (minutes):  30   Critical care was necessary to treat or prevent imminent or life-threatening deterioration of the following conditions:  Circulatory failure and sepsis   Critical care was time  spent personally by me on the following activities:  Development of treatment plan with patient or surrogate, discussions with consultants, evaluation of patient's response to treatment, examination of patient, ordering and review of laboratory studies, ordering and review of radiographic studies, ordering and performing treatments and interventions, pulse oximetry, re-evaluation of patient's condition and review of old charts     Medications Ordered in ED Medications  diltiazem (CARDIZEM) 125 mg in dextrose 5% 125 mL (1 mg/mL) infusion (5 mg/hr Intravenous New Bag/Given 03/18/2022 1225)  vancomycin (VANCOREADY) IVPB 750 mg/150 mL (has no administration in time range)  0.9 %  sodium chloride infusion (Manually program  via Guardrails IV Fluids) (0 mLs Intravenous Hold 03/11/2022 1735)  digoxin (LANOXIN) 0.25 MG/ML injection 0.5 mg (has no administration in time range)  enoxaparin (LOVENOX) injection 60 mg (has no administration in time range)  ceFEPIme (MAXIPIME) 2 g in sodium chloride 0.9 % 100 mL IVPB (has no administration in time range)  metroNIDAZOLE (FLAGYL) IVPB 500 mg (has no administration in time range)  sodium chloride 0.9 % bolus 500 mL (0 mLs Intravenous Stopped 03/20/2022 1035)  pantoprazole (PROTONIX) injection 40 mg (40 mg Intravenous Given 03/08/2022 0955)  fentaNYL (SUBLIMAZE) injection 50 mcg (50 mcg Intravenous Given 03/10/2022 0955)  fentaNYL (SUBLIMAZE) injection 50 mcg (50 mcg Intravenous Given 03/27/2022 1039)  sodium chloride 0.9 % bolus 500 mL (0 mLs Intravenous Stopped 03/31/2022 1330)  ondansetron (ZOFRAN) injection 4 mg (4 mg Intravenous Given 03/09/2022 1039)  iohexol (OMNIPAQUE) 300 MG/ML solution 100 mL (100 mLs Intravenous Contrast Given 03/24/2022 1051)  ceFEPIme (MAXIPIME) 2 g in sodium chloride 0.9 % 100 mL IVPB (0 g Intravenous Stopped 03/31/2022 1330)  vancomycin (VANCOREADY) IVPB 1250 mg/250 mL (0 mg Intravenous Stopped 03/14/2022 1559)  piperacillin-tazobactam (ZOSYN) IVPB 3.375 g (3.375 g Intravenous New Bag/Given 03/17/2022 1608)    ED Course/ Medical Decision Making/ A&P Clinical Course as of 03/12/2022 1759  Mon Mar 22, 2022  5621 She has a history of multiple myeloma patient with Dr. Delton Coombes.  Abdominal pain for few weeks worsening with nausea vomiting.  She is diffusely tender here tachycardic in the 150s A-fib and soft blood pressure.  Getting labs fluids pain medication PPI.  Will need some imaging.  Anticipate will need admission. [MB]  55 CT chest concerning for multifocal pneumonia possible newly forming abscess.  Started on vancomycin and cefepime.  Consult to hospital for admission [JR]  1151 MAP (mmHg): 81 [JR]  1216 Patient continues in A-fib with soft blood pressures.  Started on  diltiazem drip. [JR]  3086 CT Chest Wo Contrast [JR]    Clinical Course User Index [JR] Harriet Pho, PA-C [MB] Hayden Rasmussen, MD                             Medical Decision Making Amount and/or Complexity of Data Reviewed Labs: ordered. Radiology: ordered.  Risk Prescription drug management. Decision regarding hospitalization.   Initial Impression and Ddx 82 year old female who is ill-appearing, hypotensive and tachycardic presenting for abdominal pain.  Physical exam notable for generalized abdominal tenderness and jaundice.  Differential diagnosis for this complaint includes hepatitis, pancreatitis, appendicitis, ACS and sepsis. Patient PMH that increases complexity of ED encounter: Multiple myeloma, atrial fibrillation, and hypertension  Interpretation of Diagnostics I independent reviewed and interpreted the labs as followed: Pancytopenia, elevated BUN, hyponatremia  - I independently visualized the following imaging with scope of interpretation limited to determining acute life threatening  conditions related to emergency care: Chest CT, which revealed multifocal pneumonia with possible abscess in the right middle lobe  Patient Reassessment and Ultimate Disposition/Management Initially treated pain with fentanyl, nausea with Zofran, volume resuscitated with normal saline bolus.  CT scan abdomen pelvis for presenting complaint of abdominal pain was overall reassuring but did reveal concern for multifocal pneumonia in the right lobe.  This prompted further evaluation with chest CT which revealed concern for pneumonia along with possible related abscess.  This prompted concern for possible sepsis.  Treated with cefepime and vancomycin. Patient was also persistently hypotensive with arrhythmia concern for atrial fibrillation.  Started diltiazem drip.  Heart rate and blood pressure improved.  Admitted to hospital service for abscess of the lung and A-fib with RVR.  Patient  management required discussion with the following services or consulting groups:  Hospitalist Service  Complexity of Problems Addressed Acute complicated illness or Injury  Additional Data Reviewed and Analyzed Further history obtained from: Further history from spouse/family member, Past medical history and medications listed in the EMR, and Recent PCP notes  Patient Encounter Risk Assessment Consideration of hospitalization         Final Clinical Impression(s) / ED Diagnoses Final diagnoses:  Abscess of middle lobe of right lung with pneumonia [facility])  Atrial fibrillation with rapid ventricular response [facility])    Rx / DC Orders ED Discharge Orders          Ordered    Amb referral to AFIB Clinic        03/24/2022 1153              Harriet Pho, PA-C 03/18/2022 1759    Hayden Rasmussen, MD 03/12/2022 [phone]

## 2022-03-22 NOTE — Progress Notes (Signed)
Patient's daughter called back this morning regarding abdominal pain that has worsened. Patient is getting no relief and vomiting has become more frequent. Instructed daughter to take the patient to the ED for further evaluation. Daughter verbalized understanding.

## 2022-03-22 NOTE — Consult Note (Signed)
CARDIOLOGY CONSULT NOTE    Patient ID: [name]; [id number]; Nov 29, 1940   Admit date: 03/24/2022 Date of Consult: 04/01/2022  Primary Care Provider: Derek Jack, MD Primary Cardiologist: None Primary Electrophysiologist:  None   Patient Profile:   Courtney Arnold is a 82 y.o. female with a hx of HTN, hypothyroidism who is being seen today for the evaluation of A-fib with RVR at the request of Dr. Denton Brick.  History of Present Illness:   Courtney Arnold is a 82 year old F known to have HTN, hypothyroidism, multiple myeloma on chemo therapy presented to ER with nausea, vomiting, abdominal pain x 3 weeks prior to presentation associated with poor p.o. intake and had a fall eventually leading to nose injury. There was imaging evidence of multifocal pneumonia complicated by lung abscess versus primary bronchogenic carcinoma and bilateral small pleural effusions. Patient is currently on IV antibiotics and hospital course was also complicated by new onset of atrial fibrillation with RVR. Patient had a brief episode of atrial fibrillation with RVR in the postop setting after her kyphoplasty in 2011. She was seen cardiology outpatient and did not recommend rate controlling agents/ACE as the episode was brief and during the postop setting. She eventually had NSR throughout. She did not have any recurrent episodes of atrial fibrillation until today.  Patient is a poor historian and continues to deny all symptoms of palpitations, abdominal pain, nausea, vomiting. I obtained history from the 2 daughters, Courtney Arnold and Courtney Arnold.  Patient has been independent in performing all daily activities with no assistance until 3 weeks prior to presentation with gradual decline. She did not have any prior falls until prior to current admission.      Past Medical History:  Diagnosis Date   Atrial fibrillation ([place]) 2011   Postop, spontaneous conversion to normal sinus after one hour   CAP (community acquired  pneumonia) 09/23/2021   Cholecystitis, acute 04/25/2015   Compression fracture 07/24/2009   T12; kyphoplasty   History of echocardiogram 07/2009   EF 65%   Hypertension    Thyroid disease    Tobacco abuse     Past Surgical History:  Procedure Laterality Date   BACK SURGERY     BACK SURGERY  06/06/2015   BREAST EXCISIONAL BIOPSY Left    50 years ago  benign   CHOLECYSTECTOMY N/A 04/25/2015   Procedure: LAPAROSCOPIC CHOLECYSTECTOMY WITH INTRAOPERATIVE CHOLANGIOGRAM;  Surgeon: Mickeal Skinner, MD;  Location: [facility];  Service: General;  Laterality: N/A;   COLONOSCOPY N/A 11/26/2015   Procedure: COLONOSCOPY;  Surgeon: Daneil Dolin, MD;  Location: [facility];  Service: Endoscopy;  Laterality: N/A;  7:30 am   ERCP N/A 04/14/2015   Procedure: ENDOSCOPIC RETROGRADE CHOLANGIOPANCREATOGRAPHY (ERCP) Biliary Sphincterotomy, 10x7 stent placement Dilated bilary system just not well seen;  Surgeon: Rogene Houston, MD;  Location: [facility];  Service: Endoscopy;  Laterality: N/A;   ERCP N/A 06/12/2015   Procedure: ENDOSCOPIC RETROGRADE CHOLANGIOPANCREATOGRAPHY (ERCP);  Surgeon: Rogene Houston, MD;  [facility]: [facility];  Service: Endoscopy;  Laterality: N/A;   ESOPHAGOGASTRODUODENOSCOPY N/A 06/12/2015   Procedure: DIAGNOSTIC ESOPHAGOGASTRODUODENOSCOPY (EGD);  Surgeon: Rogene Houston, MD;  [facility];  Service: Endoscopy;  Laterality: N/A;   FEMUR IM NAIL Right 05/11/2019   Procedure: RETROGRADE INTRAMEDULLARY NAIL FEMORAL;  Surgeon: Shona Needles, MD;  Location: [facility];  Service: Orthopedics;  Laterality: Right;   STENT REMOVAL  06/12/2015   Procedure: STENT REMOVAL ;  Surgeon: Rogene Houston, MD;  [facility]: [facility];  Service: Endoscopy;;     Home Medications:  Prior to Admission medications   Medication Sig Start Date End Date Taking? Authorizing Provider  acyclovir (ZOVIRAX) 400 MG tablet TAKE (1) TABLET BY MOUTH TWICE DAILY. Patient taking differently: Take 400 mg by mouth  2 (two) times daily. 01/11/22  Yes Derek Jack, MD  CALCIUM PO Take 1 capsule by mouth daily.   Yes [provider]  carboxymethylcellulose (REFRESH PLUS) 0.5 % SOLN Place 1 drop into both eyes at bedtime.   Yes [provider]  cephALEXin (KEFLEX) 500 MG capsule Take 1 capsule (500 mg total) by mouth 2 (two) times daily. 03/17/22  Yes Derek Jack, MD  Cholecalciferol (VITAMIN D) 125 MCG (5000 UT) CAPS Take 5,000 Units by mouth daily. 05/14/19  Yes McClung, Sarah A, PA-C  Cyanocobalamin (B-12 PO) Take 1 capsule by mouth daily.   Yes [provider]  dexamethasone (DECADRON) 4 MG tablet Take 2.5 tablets (10 mg total) by mouth 2 (two) times a week. 12/24/21  Yes Derek Jack, MD  levothyroxine (SYNTHROID) 137 MCG tablet Take 1 tablet (137 mcg total) by mouth daily before breakfast. 02/16/22  Yes Shamleffer, Melanie Crazier, MD  methylphenidate (RITALIN) 5 MG tablet Take 10 mg by mouth daily. Only on days she is not taking dexamethasone tue,thu,fri,sat,sun   Yes [provider]  montelukast (SINGULAIR) 10 MG tablet Take one tablet by mouth 1 hour prior to Darzalex injection appointments Patient taking differently: Take one tablet by mouth 1 hour prior to Darzalex injection appointments - given every other Wednesday 10/28/21  Yes Derek Jack, MD  Multiple Vitamin (MULTIVITAMIN WITH MINERALS) TABS tablet Take 1 tablet by mouth daily. 09/25/21  Yes Johnson, Clanford L, MD  pomalidomide (POMALYST) 3 MG capsule Take 1 capsule (3 mg total) by mouth daily. Take for 21 days, then hold for 7 days. Repeat every 28 days. 03/12/22  Yes Derek Jack, MD  potassium chloride (KLOR-CON M) 10 MEQ tablet Take 2 tablets (20 mEq total) by mouth daily. 03/04/22  Yes Derek Jack, MD  traMADol (ULTRAM) 50 MG tablet Take 1 tablet (50 mg total) by mouth every 12 (twelve) hours as needed. 03/19/22  Yes Derek Jack, MD  ondansetron (ZOFRAN) 8  MG tablet Take 1 tablet (8 mg total) by mouth every 8 (eight) hours as needed for nausea or vomiting. Patient not taking: Reported on 03/17/2022 09/21/21   Derek Jack, MD    Inpatient Medications: Scheduled Meds:  sodium chloride   Intravenous Once   Continuous Infusions:  diltiazem (CARDIZEM) infusion 5 mg/hr (04/07/2022 1225)   piperacillin-tazobactam 3.375 g (04/06/2022 1608)   piperacillin-tazobactam (ZOSYN)  IV     [START ON 03/23/2022] vancomycin     PRN Meds:   Allergies:   No Known Allergies  Social History:   Social History   Socioeconomic History   Marital status: Widowed    Spouse name: Not on file   Number of children: 2   Years of education: Not on file   Highest education level: Not on file  Occupational History   Occupation: retired  Tobacco Use   Smoking status: Former    Packs/day: 0.15    Years: 20.00    Total pack years: 3.00    Types: Cigarettes    Quit date: 03/08/2013    Years since quitting: 9.0   Smokeless tobacco: Never  Vaping Use   Vaping Use: Never used  Substance and Sexual Activity   Alcohol use:  No    Alcohol/week: 0.0 standard drinks of alcohol   Drug use: No   Sexual activity: Not Currently  Other Topics Concern   Not on file  Social History Narrative   Active in gardens and does yard work.   Social Determinants of Health   Financial Resource Strain: Low Risk  (05/08/2019)   Overall Financial Resource Strain (CARDIA)    Difficulty of Paying Living Expenses: Not hard at all  Food Insecurity: No Food Insecurity (01/14/2022)   Hunger Vital Sign    Worried About Running Out of Food in the Last Year: Never true    Ran Out of Food in the Last Year: Never true  Transportation Needs: No Transportation Needs (01/14/2022)   PRAPARE - Hydrologist (Medical): No    Lack of Transportation (Non-Medical): No  Physical Activity: Inactive (05/08/2019)   Exercise Vital Sign    Days of Exercise per Week: 0 days     Minutes of Exercise per Session: 0 min  Stress: No Stress Concern Present (05/08/2019)   [place]    Feeling of Stress : Not at all  Social Connections: Moderately Isolated (02/06/2020)   Social Connection and Isolation Panel [NHANES]    Frequency of Communication with Friends and Family: More than three times a week    Frequency of Social Gatherings with Friends and Family: More than three times a week    Attends Religious Services: More than 4 times per year    Active Member of [employer] or Organizations: No    Attends Archivist Meetings: Never    Marital Status: Widowed  Intimate Partner Violence: Not At Risk (01/14/2022)   Humiliation, Afraid, Rape, and Kick questionnaire    Fear of Current or Ex-Partner: No    Emotionally Abused: No    Physically Abused: No    Sexually Abused: No    Family History:   Family History  Problem Relation Age of Onset   Heart attack Father    Stroke Father    Breast cancer Sister    Thyroid disease Neg Hx    Colon cancer Neg Hx      ROS:  Please see the history of present illness.  ROS All other ROS reviewed and negative.     Physical Exam/Data:   Vitals:   03/27/2022 1445 04/04/2022 1545 03/23/2022 1606 03/10/2022 1622  BP: 90/77 93/69    Pulse: (!) 101 (!) 106    Resp: (!) 47 (!) 30    Temp:   98.1 F (36.7 C) 97.8 F (36.6 C)  TempSrc:   Oral Oral  SpO2: 95% 98%      Intake/Output Summary (Last 24 hours) at 04/01/2022 1624 Last data filed at 03/13/2022 1621 Gross per 24 hour  Intake --  Output 13 ml  Net -13 ml   There were no vitals filed for this visit. There is no height or weight on file to calculate BMI.  General:  Well nourished, well developed, in no acute distress HEENT: normal Lymph: no adenopathy Neck: no JVD Endocrine:  No thryomegaly Vascular: No carotid bruits; FA pulses 2+ bilaterally without bruits  Cardiac:  normal S1, S2; RRR; no murmur   Lungs:  clear to auscultation bilaterally, no wheezing, rhonchi or rales  Abd: soft, nontender, no hepatomegaly  Ext: no edema Musculoskeletal:  No deformities, BUE and BLE strength normal and equal Skin: warm and dry  Neuro:  CNs  2-12 intact, no focal abnormalities noted Psych:  Normal affect   EKG:  The EKG was personally reviewed and demonstrates:  Afib with RVR Telemetry:  Telemetry was personally reviewed and demonstrates:  Afib with RVR  Relevant CV Studies: Echo from 03/15/2022 LVEF 55 to 60% LA and RA severely dilated Mild MR and TR  Laboratory Data:  Chemistry Recent Labs  Lab 03/17/22 1311 03/08/2022 0955  NA 135 131*  K 4.7 4.4  CL 101 95*  CO2 23 22  GLUCOSE 162* 114*  BUN 30* 26*  CREATININE 1.21* 0.83  CALCIUM 8.0* 7.9*  GFRNONAA 45* >60  ANIONGAP 11 14    Recent Labs  Lab 03/17/22 1311 03/21/2022 0955  PROT 6.4* 5.9*  ALBUMIN 3.1* 2.7*  AST 31 15  ALT 18 18  ALKPHOS 78 79  BILITOT 0.6 1.0   Hematology Recent Labs  Lab 03/17/22 1311 03/11/2022 0955  WBC 1.4* 2.2*  RBC 2.68* 2.36*  HGB 8.4* 7.5*  HCT 26.5* 23.5*  MCV 98.9 99.6  MCH 31.3 31.8  MCHC 31.7 31.9  RDW 17.9* 19.9*  PLT 44* 90*   Cardiac EnzymesNo results for input(s): "TROPONINI" in the last 168 hours. No results for input(s): "TROPIPOC" in the last 168 hours.  BNPNo results for input(s): "BNP", "PROBNP" in the last 168 hours.  DDimer No results for input(s): "DDIMER" in the last 168 hours.  Radiology/Studies:  ECHOCARDIOGRAM COMPLETE  Result Date: 03/15/2022    ECHOCARDIOGRAM REPORT   Patient Name:   JALEESA CERVI Date of Exam: 03/31/2022 Medical Rec #:  [id number]        Height:       62.0 in Accession #:    [id number]       Weight:       126.3 lb Date of Birth:  [birth year]        BSA:          1.572 m Patient Age:    53 years         BP:           82/52 mmHg Patient Gender: F                HR:           124 bpm. Exam Location:  [facility] Procedure: 2D Echo, Cardiac Doppler  and Color Doppler Indications:    Atrial Fibrillation I48.91  History:        Patient has no prior history of Echocardiogram examinations.                 Arrythmias:Atrial Fibrillation; Risk Factors:Former Smoker and                 Hypertension.  Sonographer:    Greer Pickerel Referring Phys: [id number] Chrisopher Pustejovsky P Kiira Brach  Sonographer Comments: Suboptimal subcostal window. Image acquisition challenging due to respiratory motion and Image acquisition challenging due to uncooperative patient. IMPRESSIONS  1. Left ventricular ejection fraction, by estimation, is 55 to 60%. The left ventricle has normal function. Left ventricular endocardial border not optimally defined to evaluate regional wall motion. Left ventricular diastolic function could not be evaluated.  2. Right ventricular systolic function is normal. The right ventricular size is mildly enlarged. There is moderately elevated pulmonary artery systolic pressure. The estimated right ventricular systolic pressure is 84.1 mmHg.  3. Left atrial size was severely dilated.  4. Right atrial size was severely dilated.  5. The mitral valve is abnormal. Mild mitral valve regurgitation.  No evidence of mitral stenosis.  6. Tricuspid valve regurgitation is mild to moderate.  7. The aortic valve was not well visualized. Aortic valve regurgitation is trivial. No aortic stenosis is present.  8. The inferior vena cava is normal in size with <50% respiratory variability, suggesting right atrial pressure of 8 mmHg. Comparison(s): No prior Echocardiogram. FINDINGS  Left Ventricle: Left ventricular ejection fraction, by estimation, is 55 to 60%. The left ventricle has normal function. Left ventricular endocardial border not optimally defined to evaluate regional wall motion. The left ventricular internal cavity size was normal in size. There is no left ventricular hypertrophy. Left ventricular diastolic function could not be evaluated due to atrial fibrillation. Left ventricular  diastolic function could not be evaluated. Right Ventricle: The right ventricular size is mildly enlarged. No increase in right ventricular wall thickness. Right ventricular systolic function is normal. There is moderately elevated pulmonary artery systolic pressure. The tricuspid regurgitant velocity is 3.24 m/s, and with an assumed right atrial pressure of 8 mmHg, the estimated right ventricular systolic pressure is 84.1 mmHg. Left Atrium: Left atrial size was severely dilated. Right Atrium: Right atrial size was severely dilated. Pericardium: There is no evidence of pericardial effusion. Mitral Valve: The mitral valve is abnormal. Mild to moderate mitral annular calcification. Mild mitral valve regurgitation. No evidence of mitral valve stenosis. Tricuspid Valve: The tricuspid valve is normal in structure. Tricuspid valve regurgitation is mild to moderate. No evidence of tricuspid stenosis. Aortic Valve: The aortic valve was not well visualized. Aortic valve regurgitation is trivial. No aortic stenosis is present. Pulmonic Valve: The pulmonic valve was normal in structure. Pulmonic valve regurgitation is not visualized. No evidence of pulmonic stenosis. Aorta: The aortic root and ascending aorta are structurally normal, with no evidence of dilitation. Venous: The inferior vena cava is normal in size with less than 50% respiratory variability, suggesting right atrial pressure of 8 mmHg. IAS/Shunts: The interatrial septum was not well visualized.  LEFT VENTRICLE PLAX 2D LVIDd:         3.50 cm   Diastology LVIDs:         2.40 cm   LV e' medial:    9.30 cm/s LV PW:         0.80 cm   LV E/e' medial:  13.0 LV IVS:        0.70 cm   LV e' lateral:   10.80 cm/s LVOT diam:     1.90 cm   LV E/e' lateral: 11.2 LV SV:         31 LV SV Index:   20 LVOT Area:     2.84 cm  RIGHT VENTRICLE RV S prime:     11.30 cm/s TAPSE (M-mode): 1.4 cm LEFT ATRIUM             Index        RIGHT ATRIUM           Index LA diam:        3.90 cm  2.48 cm/m   RA Area:     22.10 cm LA Vol (A2C):   75.4 ml 47.95 ml/m  RA Volume:   69.80 ml  44.39 ml/m LA Vol (A4C):   72.7 ml 46.23 ml/m LA Biplane Vol: 74.8 ml 47.57 ml/m  AORTIC VALVE LVOT Vmax:   84.90 cm/s LVOT Vmean:  59.500 cm/s LVOT VTI:    0.111 m  AORTA Ao Root diam: 3.20 cm Ao Asc diam:  3.70 cm MITRAL VALVE  TRICUSPID VALVE MV Area (PHT): 5.06 cm     TR Peak grad:   42.0 mmHg MV Decel Time: 150 msec     TR Vmax:        324.00 cm/s MV E velocity: 121.00 cm/s                             SHUNTS                             Systemic VTI:  0.11 m                             Systemic Diam: 1.90 cm Piers Baade Priya Therese Rocco Electronically signed by Lorelee Cover Bralin Garry Signature Date/Time: 03/14/2022/4:22:37 PM    Final    CT Chest Wo Contrast  Result Date: 03/17/2022 CLINICAL DATA:  Right middle lobe abnormality noted on same day CT abdomen and pelvis EXAM: CT CHEST WITHOUT CONTRAST TECHNIQUE: Multidetector CT imaging of the chest was performed following the standard protocol without IV contrast. RADIATION DOSE REDUCTION: This exam was performed according to the departmental dose-optimization program which includes automated exposure control, adjustment of the mA and/or kV according to patient size and/or use of iterative reconstruction technique. COMPARISON:  CT chest dated 09/27/2021 FINDINGS: Cardiovascular: Normal heart size. No significant pericardial fluid/thickening. Coronary artery calcifications and aortic atherosclerosis. Great vessels are normal in course and caliber. Mediastinum/Nodes: Imaged thyroid gland without nodules meeting criteria for imaging follow-up by size. Normal esophagus. No pathologically enlarged axillary, supraclavicular, mediastinal, or hilar lymph nodes. Lungs/Pleura: The central airways are patent. Near-complete resolution of previously noted left upper lobe consolidation. Interval development of multifocal new nodules and consolidation, predominantly  involving the right middle lobe, where there is a gas-containing fluid collection measuring 2.2 x 1.6 cm in the base of the right middle lobe (2:87). No focal consolidation. No pneumothorax. Moderate right and trace left pleural effusions. Upper abdomen: Partially imaged bilateral renal cysts. Musculoskeletal: Diffusely heterogeneous appearance of the skeleton in keeping with known multiple myeloma. Prior vertebral augmentations of T6, T7 and T12 with additional compression deformities of T9 and T10. Similar sclerotic appearance of the left posterolateral seventh rib IMPRESSION: 1. Interval development of multifocal new nodules and consolidation, predominantly involving the right middle lobe, where there is a gas-containing fluid collection measuring 2.2 x 1.6 cm in the base of the right middle lobe. Findings are most consistent with multifocal pneumonia with abscess formation. 2. Near-complete resolution of previously noted left upper lobe consolidation. 3. Moderate right and trace left pleural effusions. 4. Diffusely heterogeneous appearance of the skeleton in keeping with known multiple myeloma. 5. Coronary artery calcifications. Aortic Atherosclerosis (ICD10-I70.0). Electronically Signed   By: Darrin Nipper M.D.   On: 03/15/2022 11:43   CT Abdomen Pelvis W Contrast  Result Date: 03/28/2022 CLINICAL DATA:  Acute abdominal pain EXAM: CT ABDOMEN AND PELVIS WITH CONTRAST TECHNIQUE: Multidetector CT imaging of the abdomen and pelvis was performed using the standard protocol following bolus administration of intravenous contrast. RADIATION DOSE REDUCTION: This exam was performed according to the departmental dose-optimization program which includes automated exposure control, adjustment of the mA and/or kV according to patient size and/or use of iterative reconstruction technique. CONTRAST:  178m OMNIPAQUE IOHEXOL 300 MG/ML  SOLN COMPARISON:  CT scan of the chest 09/27/2021; CT scan of the abdomen and pelvis  09/22/2021  FINDINGS: Lower chest: Partially imaged ill-defined masslike density versus consolidation in the anterior inferior right middle lobe measuring at least 5.6 x 3.1 cm. Moderate layering right pleural effusion. Trace left pleural effusion. Dependent atelectasis. Cardiomegaly. No pericardial effusion. Small hiatal hernia with esophageal wall thickening. Hepatobiliary: Normal hepatic contour and morphology. No discrete hepatic lesion. The gallbladder is surgically absent. Similar degree of mild intra and extra hepatic biliary ductal dilatation. The main bile duct measures 8 mm at the pancreatic head. Pancreas: Unremarkable. No pancreatic ductal dilatation or surrounding inflammatory changes. Spleen: Normal in size without focal abnormality. Adrenals/Urinary Tract: Normal adrenal glands. No hydronephrosis, nephrolithiasis or enhancing renal mass. Simple renal cysts are noted. No imaging follow-up recommended. 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 No evidence of obstruction or focal bowel wall thickening. Normal appendix in the right lower quadrant. The terminal ileum is unremarkable. Vascular/Lymphatic: Atherosclerotic calcifications throughout the abdominal aorta. No evidence of aneurysm or dissection. No evidence of significant arterial stenosis or occlusion. No focal venous abnormality. No suspicious lymphadenopathy. Reproductive: Uterus and bilateral adnexa are unremarkable. Other: No abdominal wall hernia or abnormality. No abdominopelvic ascites. Musculoskeletal: No evidence of acute fracture or malalignment. Multiple thoracic and lumbar compression fractures are again noted without interval change. Evidence of prior cement augmentation at T12. IMPRESSION: 1. Partially imaged ill-defined masslike density versus consolidation in the anterior inferior right middle lobe measuring 5.6 x 3.1 cm. Differential considerations include necrotic pneumonia versus  primary bronchogenic carcinoma. Recommend further evaluation with CT scan of the chest. 2. Moderate layering right pleural effusion and trace left pleural effusion. 3. Cardiomegaly. 4. Small hiatal hernia with circumferential esophageal wall thickening. This may reflect GERD, or less likely esophageal neoplasm. 5. Colonic diverticular disease without CT evidence of active inflammation. 6. Scattered atherosclerotic vascular calcifications without aneurysm, dissection or significant vascular occlusion. 7. Stable appearance of multiple thoracic and lumbar compression fractures including prior cement augmentation at T12. Aortic Atherosclerosis (ICD10-I70.0). Electronically Signed   By: Jacqulynn Cadet M.D.   On: 03/19/2022 11:06    Assessment and Plan:   Patient is a 82 year old F known to have HTN, hypothyroidism, multiple myeloma presented to the ER with nausea, vomiting and abdominal pain x 3 weeks prior to presentation and is currently admitted to hospitalist team for the management of multifocal pneumonia complicated by lung abscess on IV antibiotics and new onset of atrial fibrillation with RVR on diltiazem drip 5 mg/h.  # New onset atrial fibrillation with RVR likely secondary to pneumonia complicated by lung abscess, CHA2DS2-VASc 2 score is more than 2 -Patient had a brief episode of atrial fibrillation with RVR in the postop setting after her kyphoplasty in 2011. She was seen cardiology outpatient and did not recommend rate controlling agents/ACE as the episode was brief and during the postop setting. She eventually had NSR throughout. She did not have any recurrent episodes of atrial fibrillation until today. -Continue diltiazem drip at 5 mg/h and treat underlying etiology. Goal HR 100 bpm in the setting of infection. -IV digoxin load with 0.5 mg one-time dose followed by 0.25 mg every 6 hours apart for 2 doses. -Will hold off on Amio drip due to history of hypothyroidism. However if the heart  rates continue to be poorly controlled despite being on diltiazem drip and digoxin, we will have to switch diltiazem drip to amiodarone drip. -Start assessment anticoagulation with therapeutic Lovenox -Echo on 03/13/2022 showed normal LVEF but severely dilated right atrium and left atrium with functional TR/MR.  #  Pneumonia complicated by lung abscess -Management per primary team.  Currently on antibiotics.  # HTN, currently has soft blood pressures -Hold home antihypertensive medications.  I have spent a total of 55 minutes with patient reviewing chart , telemetry, EKGs, labs and examining patient as well as establishing an assessment and plan that was discussed with the patient.  > 50% of time was spent in direct patient care.     For questions or updates, please contact [place] Please consult [email or web address] for contact info under Cardiology/STEMI.   Signed, Vangie Bicker, MD 03/24/2022 4:24 PM

## 2022-03-22 NOTE — Progress Notes (Signed)
-  RN Lonn Georgia Nichols--reports persistent hypotension on IV Cardizem drip at 5 mg  -Patient remains tachycardic -Will stop IV Cardizem due to persistent hypotension  -Start amiodarone instead for rate control - Await further recommendations from cardiology team  Roxan Hockey, MD

## 2022-03-22 NOTE — Progress Notes (Signed)
  Echocardiogram 2D Echocardiogram has been performed.  Courtney Arnold 03/24/2022, 4:11 PM

## 2022-03-22 NOTE — Consult Note (Signed)
NAME:  Courtney Arnold, MRN:  [id number], DOB:  1940/04/25, LOS: 0 ADMISSION DATE:  04/06/2022, CONSULTATION DATE:  03/22/21 REFERRING MD:  Denton Brick  CHIEF COMPLAINT:  abd pain N and V while on chemo for refractory MM and now with R ML necrotizing pna   History of Present Illness:  81  yowf quit smoking 8 y PTA with  relapsed refractory MM and HBP ? Prior afib  admitted with 3 weeks of gen abd pain N and V with multifocal pna most dense in RML with cavity formation and PCCM service consulted pm 1/15   Pertinent  Medical History  82 y.o. female with multiple myeloma, atrial fibrillation, hypertension and history of cholecystectomy presenting for abdominal pain.  Abdominal pain started about 2 weeks PTA  States she is tender all over.  She has had 3 weeks of nausea and vomiting.  States her appetite has been really poor she is only drinking [facility] but cannot keep it down.  Denies fever or chills.  Denies diarrhea but still has been loose.  Denies hematemesis, hematochezia or melena and hematuria.  Daughter believes it to be related to new medication for her multiple myeloma which she started 6 months ago.  Medication is Pomalyst.  Daughter also mentioned that her skin appears more "yellow".  Taking weekly steroids.  Daughter stated   not currently being treated for atrial fibrillation.   Significant Hospital Events: Including procedures, antibiotic start and stop dates in addition to other pertinent events   MRSA  PCR  1/15 neg  BC x 2  1/15   Interim History / Subjective:  Still c/o abd pain / can't keep anything down   Objective   Blood pressure (!) 94/57, pulse (!) 59, temperature 97.6 F (36.4 C), temperature source Oral, resp. rate (!) 32, SpO2 (!) 75 %.       No intake or output data in the 24 hours ending 04/02/2022 1352 There were no vitals filed for this visit.  Examination: Tmax:  98.1 General appearance:    acute and chronically ill appeairng   At Rest 02 sats  98% on 2lpm    No jvd Oropharynx clear,  mucosa nl Neck supple Lungs with a few scattered exp > insp rhonchi bilaterally RRR no s3 or or sign murmur Abd mod tense pos rebound  Extr warm with no edema or clubbing noted Neuro  Sensorium intct ,  no apparent motor deficits        Assessment & Plan:  1) compromised host on Chemo for relapsed refractory  MM with Multifocal pna with one area suggesting a necrotizing process in the RML ? Lung abscess vs opportunistic infection with necrosis eg nocardia or aspegillus or even mucormycosis  >>> for now rx as pyogenic process though note she has no teetch and no cough or reports of putrid sputum so this is very tenous dx  2) Rapid afib with borderly bp's suggesting possible sepsis - rx cardizem per [facility]  Rec:  If going to continue to be aggressive here would strongly consider transfer to tertiary med center for consideration for FOB but prognosis appears to be very poor due to underlying dz so would involve Dr Raliegh Ip (oncology ) on discussion of how aggressive to be.  >>> transfer to [facility] in progress/ [facility] service    3) abd pain with peritonial signs with neg CT abd >>> check amylast/lipase ? Reaction to chemo?   Labs   CBC: Recent Labs  Lab 03/17/22  1311 04/03/2022 0955  WBC 1.4* 2.2*  NEUTROABS  --  1.2*  HGB 8.4* 7.5*  HCT 26.5* 23.5*  MCV 98.9 99.6  PLT 44* 90*    Basic Metabolic Panel: Recent Labs  Lab 03/17/22 1311 03/08/2022 0955 03/18/2022 1222  NA 135 131*  --   K 4.7 4.4  --   CL 101 95*  --   CO2 23 22  --   GLUCOSE 162* 114*  --   BUN 30* 26*  --   CREATININE 1.21* 0.83  --   CALCIUM 8.0* 7.9*  --   MG 2.0  --  2.0   GFR: Estimated Creatinine Clearance: 42.8 mL/min (by C-G formula based on SCr of 0.83 mg/dL). Recent Labs  Lab 03/17/22 1311 03/11/2022 0955 03/28/2022 1035 03/24/2022 1222  WBC 1.4* 2.2*  --   --   LATICACIDVEN  --   --  1.9 1.5    Liver Function Tests: Recent Labs  Lab 03/17/22 1311 03/21/2022 0955  AST 31 15   ALT 18 18  ALKPHOS 78 79  BILITOT 0.6 1.0  PROT 6.4* 5.9*  ALBUMIN 3.1* 2.7*   Recent Labs  Lab 03/24/2022 0955  LIPASE 21   No results for input(s): "AMMONIA" in the last 168 hours.  ABG    Component Value Date/Time   TCO2 27 04/07/2015 2002     Coagulation Profile: No results for input(s): "INR", "PROTIME" in the last 168 hours.  Cardiac Enzymes: No results for input(s): "CKTOTAL", "CKMB", "CKMBINDEX", "TROPONINI" in the last 168 hours.  HbA1C: No results found for: "HGBA1C"  CBG: No results for input(s): "GLUCAP" in the last 168 hours.     Past Medical History:  She,  has a past medical history of Atrial fibrillation ([place]) (2011), CAP (community acquired pneumonia) (09/23/2021), Cholecystitis, acute (04/25/2015), Compression fracture (07/24/2009), History of echocardiogram (07/2009), Hypertension, Thyroid disease, and Tobacco abuse.   Surgical History:   Past Surgical History:  Procedure Laterality Date   BACK SURGERY     BACK SURGERY  06/06/2015   BREAST EXCISIONAL BIOPSY Left    50 years ago  benign   CHOLECYSTECTOMY N/A 04/25/2015   Procedure: LAPAROSCOPIC CHOLECYSTECTOMY WITH INTRAOPERATIVE CHOLANGIOGRAM;  Surgeon: Mickeal Skinner, MD;  Location: [facility];  Service: General;  Laterality: N/A;   COLONOSCOPY N/A 11/26/2015   Procedure: COLONOSCOPY;  Surgeon: Daneil Dolin, MD;  Location: [facility];  Service: Endoscopy;  Laterality: N/A;  7:30 am   ERCP N/A 04/14/2015   Procedure: ENDOSCOPIC RETROGRADE CHOLANGIOPANCREATOGRAPHY (ERCP) Biliary Sphincterotomy, 10x7 stent placement Dilated bilary system just not well seen;  Surgeon: Rogene Houston, MD;  Location: [facility];  Service: Endoscopy;  Laterality: N/A;   ERCP N/A 06/12/2015   Procedure: ENDOSCOPIC RETROGRADE CHOLANGIOPANCREATOGRAPHY (ERCP);  Surgeon: Rogene Houston, MD;  Location: [facility];  Service: Endoscopy;  Laterality: N/A;   ESOPHAGOGASTRODUODENOSCOPY N/A 06/12/2015   Procedure: DIAGNOSTIC  ESOPHAGOGASTRODUODENOSCOPY (EGD);  Surgeon: Rogene Houston, MD;  Location: [facility];  Service: Endoscopy;  Laterality: N/A;   FEMUR IM NAIL Right 05/11/2019   Procedure: RETROGRADE INTRAMEDULLARY NAIL FEMORAL;  Surgeon: Shona Needles, MD;  Location: [place];  Service: Orthopedics;  Laterality: Right;   STENT REMOVAL  06/12/2015   Procedure: STENT REMOVAL ;  Surgeon: Rogene Houston, MD;  Location: [facility];  Service: Endoscopy;;     Social History:   reports that she quit smoking about 9 years ago. Her smoking use included cigarettes. She  has a 3.00 pack-year smoking history. She has never used smokeless tobacco. She reports that she does not drink alcohol and does not use drugs.   Family History:  Her family history includes Breast cancer in her sister; Heart attack in her father; Stroke in her father. There is no history of Thyroid disease or Colon cancer.   Allergies No Known Allergies   Home Medications  Prior to Admission medications   Medication Sig Start Date End Date Taking? Authorizing Provider  acyclovir (ZOVIRAX) 400 MG tablet TAKE (1) TABLET BY MOUTH TWICE DAILY. Patient taking differently: Take 400 mg by mouth 2 (two) times daily. 01/11/22  Yes Derek Jack, MD  CALCIUM PO Take 1 capsule by mouth daily.   Yes [provider]  carboxymethylcellulose (REFRESH PLUS) 0.5 % SOLN Place 1 drop into both eyes at bedtime.   Yes [provider]  cephALEXin (KEFLEX) 500 MG capsule Take 1 capsule (500 mg total) by mouth 2 (two) times daily. 03/17/22  Yes Derek Jack, MD  Cholecalciferol (VITAMIN D) 125 MCG (5000 UT) CAPS Take 5,000 Units by mouth daily. 05/14/19  Yes McClung, Sarah A, PA-C  Cyanocobalamin (B-12 PO) Take 1 capsule by mouth daily.   Yes [provider]  dexamethasone (DECADRON) 4 MG tablet Take 2.5 tablets (10 mg total) by mouth 2 (two) times a week. 12/24/21  Yes Derek Jack, MD  levothyroxine (SYNTHROID) 137 MCG  tablet Take 1 tablet (137 mcg total) by mouth daily before breakfast. 02/16/22  Yes Shamleffer, Melanie Crazier, MD  methylphenidate (RITALIN) 5 MG tablet Take 10 mg by mouth daily. Only on days she is not taking dexamethasone tue,thu,fri,sat,sun   Yes [provider]  montelukast (SINGULAIR) 10 MG tablet Take one tablet by mouth 1 hour prior to Darzalex injection appointments Patient taking differently: Take one tablet by mouth 1 hour prior to Darzalex injection appointments - given every other Wednesday 10/28/21  Yes Derek Jack, MD  Multiple Vitamin (MULTIVITAMIN WITH MINERALS) TABS tablet Take 1 tablet by mouth daily. 09/25/21  Yes Johnson, Clanford L, MD  pomalidomide (POMALYST) 3 MG capsule Take 1 capsule (3 mg total) by mouth daily. Take for 21 days, then hold for 7 days. Repeat every 28 days. 03/12/22  Yes Derek Jack, MD  potassium chloride (KLOR-CON M) 10 MEQ tablet Take 2 tablets (20 mEq total) by mouth daily. 03/04/22  Yes Derek Jack, MD  traMADol (ULTRAM) 50 MG tablet Take 1 tablet (50 mg total) by mouth every 12 (twelve) hours as needed. 03/19/22  Yes Derek Jack, MD  ondansetron (ZOFRAN) 8 MG tablet Take 1 tablet (8 mg total) by mouth every 8 (eight) hours as needed for nausea or vomiting. Patient not taking: Reported on 03/17/2022 09/21/21   Derek Jack, MD     Christinia Gully, MD Pulmonary and [place] [phone]   After 7:00 pm call Elink  [phone]

## 2022-03-22 NOTE — Progress Notes (Addendum)
Pharmacy Antibiotic Note  Courtney Arnold is a 82 y.o. female admitted on 03/26/2022 with pneumonia/lung abscess.  Pharmacy has been consulted for vancomycin and zosyn dosing.  Plan: Vancomycin 1250 mg IV x 1 dose. Vancomycin 750 mg IV every 24 hours. Zosyn 3.375g IV every 8 hours. Monitor labs, c/s, and vanco level as indicated.     Temp (24hrs), Avg:97.6 F (36.4 C), Min:97.6 F (36.4 C), Max:97.6 F (36.4 C)  Recent Labs  Lab 03/17/22 1311 03/23/2022 0955 03/15/2022 1035  WBC 1.4* 2.2*  --   CREATININE 1.21* 0.83  --   LATICACIDVEN  --   --  1.9    Estimated Creatinine Clearance: 42.8 mL/min (by C-G formula based on SCr of 0.83 mg/dL).    No Known Allergies  Antimicrobials this admission: Vanco 1/15 >> Zosyn 1/15 >> Cefepime 1/15  Microbiology results: 1/15 BCx: pending  1/15 MRSA PCR: pending  Thank you for allowing pharmacy to be a part of this patient's care.  Margot Ables, PharmD Clinical Pharmacist 03/11/2022 12:15 PM

## 2022-03-22 NOTE — ED Notes (Signed)
BP 83/50. Cardizem stopped.

## 2022-03-22 NOTE — Progress Notes (Signed)
Pharmacy Antibiotic Note  Courtney Arnold is a 82 y.o. female admitted on 03/24/2022 with pneumonia/lung abscess.  Pharmacy has been consulted for vancomycin and cefepime/flagyl dosing. ID recommended cefepime/flagyl  Plan: Continue with Vancomycin 750 mg IV every 24 hours. D/C zosyn Cefepime 2gm IV q12h Flagyl '500mg'$  IV q12h Monitor labs, c/s, and vanco level as indicated.   Temp (24hrs), Avg:97.8 F (36.6 C), Min:97.6 F (36.4 C), Max:98.1 F (36.7 C)  Recent Labs  Lab 03/17/22 1311 03/09/2022 0955 03/14/2022 1035 04/03/2022 1222  WBC 1.4* 2.2*  --   --   CREATININE 1.21* 0.83  --   --   LATICACIDVEN  --   --  1.9 1.5     Estimated Creatinine Clearance: 42.8 mL/min (by C-G formula based on SCr of 0.83 mg/dL).    No Known Allergies  Antimicrobials this admission: Vanco 1/15 >> Zosyn 1/15 x 1 dose in ED Cefepime 1/15>> Metronidazole 1/15>>  Microbiology results: 1/15 BCx: pending  1/15 MRSA PCR: pending  Thank you for allowing pharmacy to be a part of this patient's care.  Isac Sarna, BS Pharm D, BCPS Clinical Pharmacist 03/10/2022 4:44 PM

## 2022-03-22 NOTE — H&P (Signed)
Patient Demographics:    Courtney Arnold, is a 82 y.o. female  MRN: [id number]   DOB - [birth year]  Admit Date - 03/24/2022  Outpatient Primary MD for the patient is Derek Jack, MD   Assessment & Plan:   Assessment and Plan:  1)Lung Abscess--CT chest without contrast shows multifocal pneumonia with abscess formation--immunocompromised host (multiple myeloma undergoing chemoRx)- =-Consult from pulmonologist appreciated- ?? necrotizing process in the RML ? Lung abscess vs opportunistic infection with necrosis eg nocardia or aspegillus or even mucormycosis ---Versus garden-variety pyogenic process -patient received Vanco cefepime and Flagyl--continue same- --continue bronchodilators and mucolytics  2)PAFib with RVR-blood pressure has been soft -prior history of paroxysmal atrial fibrillation in the postoperative state back in 2011 -Cardiology consult appreciated --EKG consistent with A-fib with RVR -Troponin 10 -Magnesium is 2.0 -Echo with preserved EF of 55 to 60% however patient has severe biatrial enlargement - CHA2DS2- VASc score   is = 4 (age x 2, HTN and Female),   Which is  equal to =  % annual risk of stroke  This patients CHA2DS2-VASc Score and unadjusted Ischemic Stroke Rate (% per year) is equal to 4.8 % stroke rate/year from a score of 4 - -Risk versus benefit of anticoagulation discussed with patient's 2 daughters Lynelle Smoke and Vivien Rota Discontinue therapeutic Lovenox as patient has significant pancytopenia with significant anemia and thrombocytopenia requiring transfusions of PRBCs and platelets every few weeks- -continue IV Cardizem and p.o. digoxin for rate control -Consider transition to IV amiodarone if rate control remains challenging  3)Multiple Myeloma--not in remission,  -discussed with  patient's primary oncologist Dr. Delton Coombes, hold Pomalyst in the setting of acute infection -Continue acyclovir prophylactically -Patient typically gets Dara shots in the oncology clinic as well  4)significant pancytopenia with significant anemia and thrombocytopenia --secondary to multiple Aloma as above #3 requiring transfusions of PRBCs and platelets every few weeks- -platelets currently higher than recent baseline -Monitor closely and transfuse as needed  5)Hypothyroidism--- check TSH especially in view of A-fib with RVR -Continue potassium  6) acute hypoxic respiratory failure--- due to #1 above -Currently on 3 L of oxygen via nasal cannula -Management as above #1  7)COPD--continue bronchodilators, mucolytics -Hold off on steroids  8)Abd pain with Nausea and Vomiting--- lipase and LFTs are not elevated -CT abdomen and pelvis with Small hiatal hernia with circumferential esophageal wall thickening. This may reflect GERD, or less likely esophageal neoplasm. -Hold off for now on GI consult for possible EGD -Give Protonix -Zofran as needed  9) hyponatremia--in the setting of vomiting and poor oral intake -Hydrate gently  10)Social/Ethics--plan of care and advanced directive discussed with patient, daughter Lynelle Smoke and Nicole Kindred at bedside -They request DNR status -Treat the treatable -Get palliative care consult to help further delineate goals of care  Status is: Inpatient  Remains inpatient appropriate because:   Dispo: The patient is from: Home              Anticipated  d/c is to: Home              Anticipated d/c date is: > 3 days              Patient currently is not medically stable to d/c. Barriers: Not Clinically Stable-  With History of - Reviewed by me  Past Medical History:  Diagnosis Date   Atrial fibrillation ([place]) 2011   Postop, spontaneous conversion to normal sinus after one hour   CAP (community acquired pneumonia) 09/23/2021   Cholecystitis, acute  04/25/2015   Compression fracture 07/24/2009   T12; kyphoplasty   History of echocardiogram 07/2009   EF 65%   Hypertension    Thyroid disease    Tobacco abuse       Past Surgical History:  Procedure Laterality Date   BACK SURGERY     BACK SURGERY  06/06/2015   BREAST EXCISIONAL BIOPSY Left    50 years ago  benign   CHOLECYSTECTOMY N/A 04/25/2015   Procedure: LAPAROSCOPIC CHOLECYSTECTOMY WITH INTRAOPERATIVE CHOLANGIOGRAM;  Surgeon: Mickeal Skinner, MD;  Location: [facility];  Service: General;  Laterality: N/A;   COLONOSCOPY N/A 11/26/2015   Procedure: COLONOSCOPY;  Surgeon: Daneil Dolin, MD;  Location: [facility];  Service: Endoscopy;  Laterality: N/A;  7:30 am   ERCP N/A 04/14/2015   Procedure: ENDOSCOPIC RETROGRADE CHOLANGIOPANCREATOGRAPHY (ERCP) Biliary Sphincterotomy, 10x7 stent placement Dilated bilary system just not well seen;  Surgeon: Rogene Houston, MD;  Location: [facility];  Service: Endoscopy;  Laterality: N/A;   ERCP N/A 06/12/2015   Procedure: ENDOSCOPIC RETROGRADE CHOLANGIOPANCREATOGRAPHY (ERCP);  Surgeon: Rogene Houston, MD;  Location: [facility];  Service: Endoscopy;  Laterality: N/A;   ESOPHAGOGASTRODUODENOSCOPY N/A 06/12/2015   Procedure: DIAGNOSTIC ESOPHAGOGASTRODUODENOSCOPY (EGD);  Surgeon: Rogene Houston, MD;  Location: [facility];  Service: Endoscopy;  Laterality: N/A;   FEMUR IM NAIL Right 05/11/2019   Procedure: RETROGRADE INTRAMEDULLARY NAIL FEMORAL;  Surgeon: Shona Needles, MD;  Location: [facility];  Service: Orthopedics;  Laterality: Right;   STENT REMOVAL  06/12/2015   Procedure: STENT REMOVAL ;  Surgeon: Rogene Houston, MD;  Location: [facility];  Service: Endoscopy;;   Chief Complaint  Patient presents with   Abdominal Pain     HPI:    Courtney Arnold  is a 82 y.o. female reformed smoker with underlying COPD, with past medical history relevant for prior history of paroxysmal atrial fibrillation in the postoperative state back in 2011, multiple  myeloma (not in remission), chronic pancytopenia with significant anemia and thrombocytopenia requiring transfusion of PRBC and platelets from time to time, HTN, hypothyroidism who presents with complaints of abdominal pain nausea and vomiting for the last couple weeks or so No fever  Or chills , no diarrhea -Emesis has been without blood or bile -Additional history obtained from patient's daughters at bedside Ms Lynelle Smoke and Vivien Rota -In the ED she is found to be in A-fib with RVR, denies chest pains she does have dyspnea -- patient has chronic cough and dyspnea due to underlying COPD,  -EKG consistent with A-fib with RVR -Troponin 10 -UA is not suggestive of UTI -Magnesium is 2.0 -Lipase is 21 -Sodium is low at 131, chloride 95, glucose 114 creatinine 0.83 -LFTs are not elevated -WBC 2.2, Hgb 7.5 platelets 90 which is higher than recent baseline  -CT Chest without contrast--showed interval development of multifocal new nodules and consolidation, predominantly involving the right middle lobe, where there is a gas-containing fluid  collection measuring 2.2 x 1.6 cm in the base of the right middle lobe. Findings are most consistent with multifocal pneumonia with abscess formation. -  CT abdomen and pelvis with Small hiatal hernia with circumferential esophageal wall thickening. This may reflect GERD, or less likely esophageal neoplasm.   Review of systems:    In addition to the HPI above,   A full Review of  Systems was done, all other systems reviewed are negative except as noted above in HPI , .    Social History:  Reviewed by me   Social History   Tobacco Use   Smoking status: Former    Packs/day: 0.15    Years: 20.00    Total pack years: 3.00    Types: Cigarettes    Quit date: 03/08/2013    Years since quitting: 9.0   Smokeless tobacco: Never  Substance Use Topics   Alcohol use: No    Alcohol/week: 0.0 standard drinks of alcohol    Family History :  Reviewed by me   Family  History  Problem Relation Age of Onset   Heart attack Father    Stroke Father    Breast cancer Sister    Thyroid disease Neg Hx    Colon cancer Neg Hx     Home Medications:   Prior to Admission medications   Medication Sig Start Date End Date Taking? Authorizing Provider  acyclovir (ZOVIRAX) 400 MG tablet TAKE (1) TABLET BY MOUTH TWICE DAILY. Patient taking differently: Take 400 mg by mouth 2 (two) times daily. 01/11/22  Yes Derek Jack, MD  CALCIUM PO Take 1 capsule by mouth daily.   Yes [provider]  carboxymethylcellulose (REFRESH PLUS) 0.5 % SOLN Place 1 drop into both eyes at bedtime.   Yes [provider]  cephALEXin (KEFLEX) 500 MG capsule Take 1 capsule (500 mg total) by mouth 2 (two) times daily. 03/17/22  Yes Derek Jack, MD  Cholecalciferol (VITAMIN D) 125 MCG (5000 UT) CAPS Take 5,000 Units by mouth daily. 05/14/19  Yes McClung, Sarah A, PA-C  Cyanocobalamin (B-12 PO) Take 1 capsule by mouth daily.   Yes [provider]  dexamethasone (DECADRON) 4 MG tablet Take 2.5 tablets (10 mg total) by mouth 2 (two) times a week. 12/24/21  Yes Derek Jack, MD  levothyroxine (SYNTHROID) 137 MCG tablet Take 1 tablet (137 mcg total) by mouth daily before breakfast. 02/16/22  Yes Shamleffer, Melanie Crazier, MD  methylphenidate (RITALIN) 5 MG tablet Take 10 mg by mouth daily. Only on days she is not taking dexamethasone tue,thu,fri,sat,sun   Yes [provider]  montelukast (SINGULAIR) 10 MG tablet Take one tablet by mouth 1 hour prior to Darzalex injection appointments Patient taking differently: Take one tablet by mouth 1 hour prior to Darzalex injection appointments - given every other Wednesday 10/28/21  Yes Derek Jack, MD  Multiple Vitamin (MULTIVITAMIN WITH MINERALS) TABS tablet Take 1 tablet by mouth daily. 09/25/21  Yes Johnson, Clanford L, MD  pomalidomide (POMALYST) 3 MG capsule Take 1 capsule (3 mg total) by mouth  daily. Take for 21 days, then hold for 7 days. Repeat every 28 days. 03/12/22  Yes Derek Jack, MD  potassium chloride (KLOR-CON M) 10 MEQ tablet Take 2 tablets (20 mEq total) by mouth daily. 03/04/22  Yes Derek Jack, MD  traMADol (ULTRAM) 50 MG tablet Take 1 tablet (50 mg total) by mouth every 12 (twelve) hours as needed. 03/19/22  Yes Derek Jack, MD  ondansetron (ZOFRAN) 8 MG tablet  Take 1 tablet (8 mg total) by mouth every 8 (eight) hours as needed for nausea or vomiting. Patient not taking: Reported on 03/17/2022 09/21/21   Derek Jack, MD    Allergies:    No Known Allergies   Physical Exam:   Vitals  Blood pressure 93/69, pulse (!) 106, temperature 97.8 F (36.6 C), temperature source Oral, resp. rate (!) 30, SpO2 98 %.  Physical Examination: General appearance - alert,  in no distress , chronically ill-appearing Mental status - alert, oriented to person, place, and time,  Nose-  3L/min Eyes - sclera anicteric Neck - supple, no JVD elevation , Chest -diminished breath sounds with few scattered rhonchi, no wheezing Heart - S1 and S2 normal, irregularly irregular, tachycardic Abdomen - soft, nontender, nondistended, +BS Neurological - screening mental status exam normal, neck supple without rigidity, cranial nerves II through XII intact, DTR's normal and symmetric Extremities - no pedal edema noted, intact peripheral pulses  Skin - warm, dry   Data Review:    CBC Recent Labs  Lab 03/17/22 1311 03/12/2022 0955  WBC 1.4* 2.2*  HGB 8.4* 7.5*  HCT 26.5* 23.5*  PLT 44* 90*  MCV 98.9 99.6  MCH 31.3 31.8  MCHC 31.7 31.9  RDW 17.9* 19.9*  LYMPHSABS  --  0.4*  MONOABS  --  0.5  EOSABS  --  0.0  BASOSABS  --  0.0   ------------------------------------------------------------------------------------------------------------------  Chemistries  Recent Labs  Lab 03/17/22 1311 04/02/2022 0955 03/12/2022 1222  NA 135 131*  --   K 4.7 4.4  --    CL 101 95*  --   CO2 23 22  --   GLUCOSE 162* 114*  --   BUN 30* 26*  --   CREATININE 1.21* 0.83  --   CALCIUM 8.0* 7.9*  --   MG 2.0  --  2.0  AST 31 15  --   ALT 18 18  --   ALKPHOS 78 79  --   BILITOT 0.6 1.0  --    ------------------------------------------------------------------------------------------------------------------ estimated creatinine clearance is 42.8 mL/min (by C-G formula based on SCr of 0.83 mg/dL). ------------------------------------------------------------------------------------------------------------------ --------------------------------------------------------------------------------------------------------------- Urinalysis    Component Value Date/Time   COLORURINE YELLOW 03/26/2022 1624   APPEARANCEUR CLEAR 04/04/2022 1624   LABSPEC >1.046 (H) 03/24/2022 1624   PHURINE 5.0 03/20/2022 1624   GLUCOSEU NEGATIVE 03/28/2022 1624   HGBUR NEGATIVE 03/21/2022 1624   BILIRUBINUR NEGATIVE 03/16/2022 1624   KETONESUR 5 (A) 03/20/2022 1624   PROTEINUR NEGATIVE 03/12/2022 1624   UROBILINOGEN 0.2 10/02/2008 0830   NITRITE NEGATIVE 04/03/2022 1624   LEUKOCYTESUR NEGATIVE 03/08/2022 1624    ----------------------------------------------------------------------------------------------------------------   Imaging Results:    ECHOCARDIOGRAM COMPLETE  Result Date: 03/10/2022    ECHOCARDIOGRAM REPORT   Patient Name:   RYLIEGH MCDUFFEY Amstutz Date of Exam: 03/08/2022 Medical Rec #:  [id number]        Height:       62.0 in Accession #:    [id number]       Weight:       126.3 lb Date of Birth:  Jun 30, 1940        BSA:          1.572 m Patient Age:    84 years         BP:           82/52 mmHg Patient Gender: F                HR:  124 bpm. Exam Location:  [facility] Procedure: 2D Echo, Cardiac Doppler and Color Doppler Indications:    Atrial Fibrillation I48.91  History:        Patient has no prior history of Echocardiogram examinations.                 Arrythmias:Atrial  Fibrillation; Risk Factors:Former Smoker and                 Hypertension.  Sonographer:    Greer Pickerel Referring Phys: [id number] VISHNU P MALLIPEDDI  Sonographer Comments: Suboptimal subcostal window. Image acquisition challenging due to respiratory motion and Image acquisition challenging due to uncooperative patient. IMPRESSIONS  1. Left ventricular ejection fraction, by estimation, is 55 to 60%. The left ventricle has normal function. Left ventricular endocardial border not optimally defined to evaluate regional wall motion. Left ventricular diastolic function could not be evaluated.  2. Right ventricular systolic function is normal. The right ventricular size is mildly enlarged. There is moderately elevated pulmonary artery systolic pressure. The estimated right ventricular systolic pressure is 50.9 mmHg.  3. Left atrial size was severely dilated.  4. Right atrial size was severely dilated.  5. The mitral valve is abnormal. Mild mitral valve regurgitation. No evidence of mitral stenosis.  6. Tricuspid valve regurgitation is mild to moderate.  7. The aortic valve was not well visualized. Aortic valve regurgitation is trivial. No aortic stenosis is present.  8. The inferior vena cava is normal in size with <50% respiratory variability, suggesting right atrial pressure of 8 mmHg. Comparison(s): No prior Echocardiogram. FINDINGS  Left Ventricle: Left ventricular ejection fraction, by estimation, is 55 to 60%. The left ventricle has normal function. Left ventricular endocardial border not optimally defined to evaluate regional wall motion. The left ventricular internal cavity size was normal in size. There is no left ventricular hypertrophy. Left ventricular diastolic function could not be evaluated due to atrial fibrillation. Left ventricular diastolic function could not be evaluated. Right Ventricle: The right ventricular size is mildly enlarged. No increase in right ventricular wall thickness. Right ventricular  systolic function is normal. There is moderately elevated pulmonary artery systolic pressure. The tricuspid regurgitant velocity is 3.24 m/s, and with an assumed right atrial pressure of 8 mmHg, the estimated right ventricular systolic pressure is 32.6 mmHg. Left Atrium: Left atrial size was severely dilated. Right Atrium: Right atrial size was severely dilated. Pericardium: There is no evidence of pericardial effusion. Mitral Valve: The mitral valve is abnormal. Mild to moderate mitral annular calcification. Mild mitral valve regurgitation. No evidence of mitral valve stenosis. Tricuspid Valve: The tricuspid valve is normal in structure. Tricuspid valve regurgitation is mild to moderate. No evidence of tricuspid stenosis. Aortic Valve: The aortic valve was not well visualized. Aortic valve regurgitation is trivial. No aortic stenosis is present. Pulmonic Valve: The pulmonic valve was normal in structure. Pulmonic valve regurgitation is not visualized. No evidence of pulmonic stenosis. Aorta: The aortic root and ascending aorta are structurally normal, with no evidence of dilitation. Venous: The inferior vena cava is normal in size with less than 50% respiratory variability, suggesting right atrial pressure of 8 mmHg. IAS/Shunts: The interatrial septum was not well visualized.  LEFT VENTRICLE PLAX 2D LVIDd:         3.50 cm   Diastology LVIDs:         2.40 cm   LV e' medial:    9.30 cm/s LV PW:         0.80 cm  LV E/e' medial:  13.0 LV IVS:        0.70 cm   LV e' lateral:   10.80 cm/s LVOT diam:     1.90 cm   LV E/e' lateral: 11.2 LV SV:         31 LV SV Index:   20 LVOT Area:     2.84 cm  RIGHT VENTRICLE RV S prime:     11.30 cm/s TAPSE (M-mode): 1.4 cm LEFT ATRIUM             Index        RIGHT ATRIUM           Index LA diam:        3.90 cm 2.48 cm/m   RA Area:     22.10 cm LA Vol (A2C):   75.4 ml 47.95 ml/m  RA Volume:   69.80 ml  44.39 ml/m LA Vol (A4C):   72.7 ml 46.23 ml/m LA Biplane Vol: 74.8 ml 47.57  ml/m  AORTIC VALVE LVOT Vmax:   84.90 cm/s LVOT Vmean:  59.500 cm/s LVOT VTI:    0.111 m  AORTA Ao Root diam: 3.20 cm Ao Asc diam:  3.70 cm MITRAL VALVE                TRICUSPID VALVE MV Area (PHT): 5.06 cm     TR Peak grad:   42.0 mmHg MV Decel Time: 150 msec     TR Vmax:        324.00 cm/s MV E velocity: 121.00 cm/s                             SHUNTS                             Systemic VTI:  0.11 m                             Systemic Diam: 1.90 cm Vishnu Priya Mallipeddi Electronically signed by Lorelee Cover Mallipeddi Signature Date/Time: 03/18/2022/4:22:37 PM    Final    CT Chest Wo Contrast  Result Date: 03/08/2022 CLINICAL DATA:  Right middle lobe abnormality noted on same day CT abdomen and pelvis EXAM: CT CHEST WITHOUT CONTRAST TECHNIQUE: Multidetector CT imaging of the chest was performed following the standard protocol without IV contrast. RADIATION DOSE REDUCTION: This exam was performed according to the departmental dose-optimization program which includes automated exposure control, adjustment of the mA and/or kV according to patient size and/or use of iterative reconstruction technique. COMPARISON:  CT chest dated 09/27/2021 FINDINGS: Cardiovascular: Normal heart size. No significant pericardial fluid/thickening. Coronary artery calcifications and aortic atherosclerosis. Great vessels are normal in course and caliber. Mediastinum/Nodes: Imaged thyroid gland without nodules meeting criteria for imaging follow-up by size. Normal esophagus. No pathologically enlarged axillary, supraclavicular, mediastinal, or hilar lymph nodes. Lungs/Pleura: The central airways are patent. Near-complete resolution of previously noted left upper lobe consolidation. Interval development of multifocal new nodules and consolidation, predominantly involving the right middle lobe, where there is a gas-containing fluid collection measuring 2.2 x 1.6 cm in the base of the right middle lobe (2:87). No focal consolidation.  No pneumothorax. Moderate right and trace left pleural effusions. Upper abdomen: Partially imaged bilateral renal cysts. Musculoskeletal: Diffusely heterogeneous appearance of the skeleton in keeping with known multiple myeloma. Prior vertebral augmentations  of T6, T7 and T12 with additional compression deformities of T9 and T10. Similar sclerotic appearance of the left posterolateral seventh rib IMPRESSION: 1. Interval development of multifocal new nodules and consolidation, predominantly involving the right middle lobe, where there is a gas-containing fluid collection measuring 2.2 x 1.6 cm in the base of the right middle lobe. Findings are most consistent with multifocal pneumonia with abscess formation. 2. Near-complete resolution of previously noted left upper lobe consolidation. 3. Moderate right and trace left pleural effusions. 4. Diffusely heterogeneous appearance of the skeleton in keeping with known multiple myeloma. 5. Coronary artery calcifications. Aortic Atherosclerosis (ICD10-I70.0). Electronically Signed   By: Darrin Nipper M.D.   On: 04/07/2022 11:43   CT Abdomen Pelvis W Contrast  Result Date: 04/01/2022 CLINICAL DATA:  Acute abdominal pain EXAM: CT ABDOMEN AND PELVIS WITH CONTRAST TECHNIQUE: Multidetector CT imaging of the abdomen and pelvis was performed using the standard protocol following bolus administration of intravenous contrast. RADIATION DOSE REDUCTION: This exam was performed according to the departmental dose-optimization program which includes automated exposure control, adjustment of the mA and/or kV according to patient size and/or use of iterative reconstruction technique. CONTRAST:  175m OMNIPAQUE IOHEXOL 300 MG/ML  SOLN COMPARISON:  CT scan of the chest 09/27/2021; CT scan of the abdomen and pelvis 09/22/2021 FINDINGS: Lower chest: Partially imaged ill-defined masslike density versus consolidation in the anterior inferior right middle lobe measuring at least 5.6 x 3.1 cm.  Moderate layering right pleural effusion. Trace left pleural effusion. Dependent atelectasis. Cardiomegaly. No pericardial effusion. Small hiatal hernia with esophageal wall thickening. Hepatobiliary: Normal hepatic contour and morphology. No discrete hepatic lesion. The gallbladder is surgically absent. Similar degree of mild intra and extra hepatic biliary ductal dilatation. The main bile duct measures 8 mm at the pancreatic head. Pancreas: Unremarkable. No pancreatic ductal dilatation or surrounding inflammatory changes. Spleen: Normal in size without focal abnormality. Adrenals/Urinary Tract: Normal adrenal glands. No hydronephrosis, nephrolithiasis or enhancing renal mass. Simple renal cysts are noted. No imaging follow-up recommended. 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 No evidence of obstruction or focal bowel wall thickening. Normal appendix in the right lower quadrant. The terminal ileum is unremarkable. Vascular/Lymphatic: Atherosclerotic calcifications throughout the abdominal aorta. No evidence of aneurysm or dissection. No evidence of significant arterial stenosis or occlusion. No focal venous abnormality. No suspicious lymphadenopathy. Reproductive: Uterus and bilateral adnexa are unremarkable. Other: No abdominal wall hernia or abnormality. No abdominopelvic ascites. Musculoskeletal: No evidence of acute fracture or malalignment. Multiple thoracic and lumbar compression fractures are again noted without interval change. Evidence of prior cement augmentation at T12. IMPRESSION: 1. Partially imaged ill-defined masslike density versus consolidation in the anterior inferior right middle lobe measuring 5.6 x 3.1 cm. Differential considerations include necrotic pneumonia versus primary bronchogenic carcinoma. Recommend further evaluation with CT scan of the chest. 2. Moderate layering right pleural effusion and trace left pleural effusion. 3. Cardiomegaly.  4. Small hiatal hernia with circumferential esophageal wall thickening. This may reflect GERD, or less likely esophageal neoplasm. 5. Colonic diverticular disease without CT evidence of active inflammation. 6. Scattered atherosclerotic vascular calcifications without aneurysm, dissection or significant vascular occlusion. 7. Stable appearance of multiple thoracic and lumbar compression fractures including prior cement augmentation at T12. Aortic Atherosclerosis (ICD10-I70.0). Electronically Signed   By: HJacqulynn CadetM.D.   On: 03/21/2022 11:06    Radiological Exams on Admission: ECHOCARDIOGRAM COMPLETE  Result Date: 03/21/2022    ECHOCARDIOGRAM REPORT   [facility] Date of Exam: 03/28/2022 Medical Rec #:  [id number]        Height:       62.0 in Accession #:    [id number]       Weight:       126.3 lb Date of Birth:  1941-02-12        BSA:          1.572 m Patient Age:    26 years         BP:           82/52 mmHg Patient Gender: F                HR:           124 bpm. Exam Location:  [facility] Procedure: 2D Echo, Cardiac Doppler and Color Doppler Indications:    Atrial Fibrillation I48.91  History:        Patient has no prior history of Echocardiogram examinations.                 Arrythmias:Atrial Fibrillation; Risk Factors:Former Smoker and                 Hypertension.  Sonographer:    Greer Pickerel Referring Phys: [id number] VISHNU P MALLIPEDDI  Sonographer Comments: Suboptimal subcostal window. Image acquisition challenging due to respiratory motion and Image acquisition challenging due to uncooperative patient. IMPRESSIONS  1. Left ventricular ejection fraction, by estimation, is 55 to 60%. The left ventricle has normal function. Left ventricular endocardial border not optimally defined to evaluate regional wall motion. Left ventricular diastolic function could not be evaluated.  2. Right ventricular systolic function is normal. The right ventricular size is mildly enlarged. There is  moderately elevated pulmonary artery systolic pressure. The estimated right ventricular systolic pressure is 29.9 mmHg.  3. Left atrial size was severely dilated.  4. Right atrial size was severely dilated.  5. The mitral valve is abnormal. Mild mitral valve regurgitation. No evidence of mitral stenosis.  6. Tricuspid valve regurgitation is mild to moderate.  7. The aortic valve was not well visualized. Aortic valve regurgitation is trivial. No aortic stenosis is present.  8. The inferior vena cava is normal in size with <50% respiratory variability, suggesting right atrial pressure of 8 mmHg. Comparison(s): No prior Echocardiogram. FINDINGS  Left Ventricle: Left ventricular ejection fraction, by estimation, is 55 to 60%. The left ventricle has normal function. Left ventricular endocardial border not optimally defined to evaluate regional wall motion. The left ventricular internal cavity size was normal in size. There is no left ventricular hypertrophy. Left ventricular diastolic function could not be evaluated due to atrial fibrillation. Left ventricular diastolic function could not be evaluated. Right Ventricle: The right ventricular size is mildly enlarged. No increase in right ventricular wall thickness. Right ventricular systolic function is normal. There is moderately elevated pulmonary artery systolic pressure. The tricuspid regurgitant velocity is 3.24 m/s, and with an assumed right atrial pressure of 8 mmHg, the estimated right ventricular systolic pressure is 24.2 mmHg. Left Atrium: Left atrial size was severely dilated. Right Atrium: Right atrial size was severely dilated. Pericardium: There is no evidence of pericardial effusion. Mitral Valve: The mitral valve is abnormal. Mild to moderate mitral annular calcification. Mild mitral valve regurgitation. No evidence of mitral valve stenosis. Tricuspid Valve: The tricuspid valve is normal in structure. Tricuspid valve regurgitation is mild to moderate. No  evidence of tricuspid stenosis. Aortic Valve: The aortic valve was not well visualized.  Aortic valve regurgitation is trivial. No aortic stenosis is present. Pulmonic Valve: The pulmonic valve was normal in structure. Pulmonic valve regurgitation is not visualized. No evidence of pulmonic stenosis. Aorta: The aortic root and ascending aorta are structurally normal, with no evidence of dilitation. Venous: The inferior vena cava is normal in size with less than 50% respiratory variability, suggesting right atrial pressure of 8 mmHg. IAS/Shunts: The interatrial septum was not well visualized.  LEFT VENTRICLE PLAX 2D LVIDd:         3.50 cm   Diastology LVIDs:         2.40 cm   LV e' medial:    9.30 cm/s LV PW:         0.80 cm   LV E/e' medial:  13.0 LV IVS:        0.70 cm   LV e' lateral:   10.80 cm/s LVOT diam:     1.90 cm   LV E/e' lateral: 11.2 LV SV:         31 LV SV Index:   20 LVOT Area:     2.84 cm  RIGHT VENTRICLE RV S prime:     11.30 cm/s TAPSE (M-mode): 1.4 cm LEFT ATRIUM             Index        RIGHT ATRIUM           Index LA diam:        3.90 cm 2.48 cm/m   RA Area:     22.10 cm LA Vol (A2C):   75.4 ml 47.95 ml/m  RA Volume:   69.80 ml  44.39 ml/m LA Vol (A4C):   72.7 ml 46.23 ml/m LA Biplane Vol: 74.8 ml 47.57 ml/m  AORTIC VALVE LVOT Vmax:   84.90 cm/s LVOT Vmean:  59.500 cm/s LVOT VTI:    0.111 m  AORTA Ao Root diam: 3.20 cm Ao Asc diam:  3.70 cm MITRAL VALVE                TRICUSPID VALVE MV Area (PHT): 5.06 cm     TR Peak grad:   42.0 mmHg MV Decel Time: 150 msec     TR Vmax:        324.00 cm/s MV E velocity: 121.00 cm/s                             SHUNTS                             Systemic VTI:  0.11 m                             Systemic Diam: 1.90 cm Vishnu Priya Mallipeddi Electronically signed by Lorelee Cover Mallipeddi Signature Date/Time: 04/06/2022/4:22:37 PM    Final    CT Chest Wo Contrast  Result Date: 03/21/2022 CLINICAL DATA:  Right middle lobe abnormality noted on same day CT  abdomen and pelvis EXAM: CT CHEST WITHOUT CONTRAST TECHNIQUE: Multidetector CT imaging of the chest was performed following the standard protocol without IV contrast. RADIATION DOSE REDUCTION: This exam was performed according to the departmental dose-optimization program which includes automated exposure control, adjustment of the mA and/or kV according to patient size and/or use of iterative reconstruction technique. COMPARISON:  CT chest dated 09/27/2021 FINDINGS: Cardiovascular: Normal heart size.  No significant pericardial fluid/thickening. Coronary artery calcifications and aortic atherosclerosis. Great vessels are normal in course and caliber. Mediastinum/Nodes: Imaged thyroid gland without nodules meeting criteria for imaging follow-up by size. Normal esophagus. No pathologically enlarged axillary, supraclavicular, mediastinal, or hilar lymph nodes. Lungs/Pleura: The central airways are patent. Near-complete resolution of previously noted left upper lobe consolidation. Interval development of multifocal new nodules and consolidation, predominantly involving the right middle lobe, where there is a gas-containing fluid collection measuring 2.2 x 1.6 cm in the base of the right middle lobe (2:87). No focal consolidation. No pneumothorax. Moderate right and trace left pleural effusions. Upper abdomen: Partially imaged bilateral renal cysts. Musculoskeletal: Diffusely heterogeneous appearance of the skeleton in keeping with known multiple myeloma. Prior vertebral augmentations of T6, T7 and T12 with additional compression deformities of T9 and T10. Similar sclerotic appearance of the left posterolateral seventh rib IMPRESSION: 1. Interval development of multifocal new nodules and consolidation, predominantly involving the right middle lobe, where there is a gas-containing fluid collection measuring 2.2 x 1.6 cm in the base of the right middle lobe. Findings are most consistent with multifocal pneumonia with  abscess formation. 2. Near-complete resolution of previously noted left upper lobe consolidation. 3. Moderate right and trace left pleural effusions. 4. Diffusely heterogeneous appearance of the skeleton in keeping with known multiple myeloma. 5. Coronary artery calcifications. Aortic Atherosclerosis (ICD10-I70.0). Electronically Signed   By: Darrin Nipper M.D.   On: 03/29/2022 11:43   CT Abdomen Pelvis W Contrast  Result Date: 04/01/2022 CLINICAL DATA:  Acute abdominal pain EXAM: CT ABDOMEN AND PELVIS WITH CONTRAST TECHNIQUE: Multidetector CT imaging of the abdomen and pelvis was performed using the standard protocol following bolus administration of intravenous contrast. RADIATION DOSE REDUCTION: This exam was performed according to the departmental dose-optimization program which includes automated exposure control, adjustment of the mA and/or kV according to patient size and/or use of iterative reconstruction technique. CONTRAST:  19m OMNIPAQUE IOHEXOL 300 MG/ML  SOLN COMPARISON:  CT scan of the chest 09/27/2021; CT scan of the abdomen and pelvis 09/22/2021 FINDINGS: Lower chest: Partially imaged ill-defined masslike density versus consolidation in the anterior inferior right middle lobe measuring at least 5.6 x 3.1 cm. Moderate layering right pleural effusion. Trace left pleural effusion. Dependent atelectasis. Cardiomegaly. No pericardial effusion. Small hiatal hernia with esophageal wall thickening. Hepatobiliary: Normal hepatic contour and morphology. No discrete hepatic lesion. The gallbladder is surgically absent. Similar degree of mild intra and extra hepatic biliary ductal dilatation. The main bile duct measures 8 mm at the pancreatic head. Pancreas: Unremarkable. No pancreatic ductal dilatation or surrounding inflammatory changes. Spleen: Normal in size without focal abnormality. Adrenals/Urinary Tract: Normal adrenal glands. No hydronephrosis, nephrolithiasis or enhancing renal mass. Simple renal  cysts are noted. No imaging follow-up recommended. 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 No evidence of obstruction or focal bowel wall thickening. Normal appendix in the right lower quadrant. The terminal ileum is unremarkable. Vascular/Lymphatic: Atherosclerotic calcifications throughout the abdominal aorta. No evidence of aneurysm or dissection. No evidence of significant arterial stenosis or occlusion. No focal venous abnormality. No suspicious lymphadenopathy. Reproductive: Uterus and bilateral adnexa are unremarkable. Other: No abdominal wall hernia or abnormality. No abdominopelvic ascites. Musculoskeletal: No evidence of acute fracture or malalignment. Multiple thoracic and lumbar compression fractures are again noted without interval change. Evidence of prior cement augmentation at T12. IMPRESSION: 1. Partially imaged ill-defined masslike density versus consolidation in the anterior inferior right middle lobe measuring 5.6 x 3.1 cm. Differential  considerations include necrotic pneumonia versus primary bronchogenic carcinoma. Recommend further evaluation with CT scan of the chest. 2. Moderate layering right pleural effusion and trace left pleural effusion. 3. Cardiomegaly. 4. Small hiatal hernia with circumferential esophageal wall thickening. This may reflect GERD, or less likely esophageal neoplasm. 5. Colonic diverticular disease without CT evidence of active inflammation. 6. Scattered atherosclerotic vascular calcifications without aneurysm, dissection or significant vascular occlusion. 7. Stable appearance of multiple thoracic and lumbar compression fractures including prior cement augmentation at T12. Aortic Atherosclerosis (ICD10-I70.0). Electronically Signed   By: Jacqulynn Cadet M.D.   On: 03/26/2022 11:06    DVT Prophylaxis -SCD/lovenox AM Labs Ordered, also please review Full Orders  Family Communication: Admission, patients condition and  plan of care including tests being ordered have been discussed with the patient and daughter Lynelle Smoke and Vivien Rota who indicate understanding and agree with the plan   Condition  -stable  Roxan Hockey M.D on 04/04/2022 at 6:14 PM Go to [email or web address] -  for contact info  [facility] - Office  [phone]

## 2022-03-22 NOTE — Progress Notes (Signed)
Patient stated she does not want breathing treatments, finished treatment but needs treatments to be Xopenex due to active AFib.

## 2022-03-22 NOTE — ED Triage Notes (Signed)
Daughter reports severe abdominal pain with N/V for three weeks. States that patient will not eat, has become so weak that it took four family members to get her into car. Color jaundice. Currently undergoing cancer treatment. Patient will only state 'Help me in triage."

## 2022-03-22 NOTE — ED Notes (Signed)
Pa notified of low sbp 88- unable to start cardizem. New plan per PA is to start cardizem at 5 mg with no loading dose, keep map above 70 as a goal for now.

## 2022-03-23 ENCOUNTER — Encounter (HOSPITAL_COMMUNITY): Payer: Self-pay | Admitting: Family Medicine

## 2022-03-23 DIAGNOSIS — I4891 Unspecified atrial fibrillation: Secondary | ICD-10-CM | POA: Diagnosis not present

## 2022-03-23 DIAGNOSIS — Z7189 Other specified counseling: Secondary | ICD-10-CM

## 2022-03-23 DIAGNOSIS — C9000 Multiple myeloma not having achieved remission: Secondary | ICD-10-CM

## 2022-03-23 DIAGNOSIS — Z515 Encounter for palliative care: Secondary | ICD-10-CM

## 2022-03-23 DIAGNOSIS — J189 Pneumonia, unspecified organism: Secondary | ICD-10-CM | POA: Diagnosis not present

## 2022-03-23 LAB — RENAL FUNCTION PANEL
Albumin: 2.4 g/dL — ABNORMAL LOW (ref 3.5–5.0)
Anion gap: 12 (ref 5–15)
BUN: 21 mg/dL (ref 8–23)
CO2: 19 mmol/L — ABNORMAL LOW (ref 22–32)
Calcium: 6.7 mg/dL — ABNORMAL LOW (ref 8.9–10.3)
Chloride: 102 mmol/L (ref 98–111)
Creatinine, Ser: 0.82 mg/dL (ref 0.44–1.00)
GFR, Estimated: >60 mL/min (ref >60)
Glucose, Bld: 111 mg/dL — ABNORMAL HIGH (ref 70–99)
Phosphorus: 2.8 mg/dL (ref 2.5–4.6)
Potassium: 4 mmol/L (ref 3.5–5.1)
Sodium: 133 mmol/L — ABNORMAL LOW (ref 135–145)

## 2022-03-23 LAB — CBC
HCT: 26.1 % — ABNORMAL LOW (ref 36.0–46.0)
Hemoglobin: 8.4 g/dL — ABNORMAL LOW (ref 12.0–15.0)
MCH: 31.8 pg (ref 26.0–34.0)
MCHC: 32.2 g/dL (ref 30.0–36.0)
MCV: 98.9 fL (ref 80.0–100.0)
Platelets: 76 10*3/uL — ABNORMAL LOW (ref 150–400)
RBC: 2.64 MIL/uL — ABNORMAL LOW (ref 3.87–5.11)
RDW: 19.2 % — ABNORMAL HIGH (ref 11.5–15.5)
WBC: 2.4 10*3/uL — ABNORMAL LOW (ref 4.0–10.5)
nRBC: 1.3 % — ABNORMAL HIGH (ref 0.0–0.2)

## 2022-03-23 LAB — COMPREHENSIVE METABOLIC PANEL
ALT: 15 U/L (ref 0–44)
AST: 10 U/L — ABNORMAL LOW (ref 15–41)
Albumin: 2.4 g/dL — ABNORMAL LOW (ref 3.5–5.0)
Alkaline Phosphatase: 64 U/L (ref 38–126)
Anion gap: 11 (ref 5–15)
BUN: 20 mg/dL (ref 8–23)
CO2: 19 mmol/L — ABNORMAL LOW (ref 22–32)
Calcium: 6.8 mg/dL — ABNORMAL LOW (ref 8.9–10.3)
Chloride: 102 mmol/L (ref 98–111)
Creatinine, Ser: 0.85 mg/dL (ref 0.44–1.00)
GFR, Estimated: >60 mL/min (ref >60)
Glucose, Bld: 118 mg/dL — ABNORMAL HIGH (ref 70–99)
Potassium: 4 mmol/L (ref 3.5–5.1)
Sodium: 132 mmol/L — ABNORMAL LOW (ref 135–145)
Total Bilirubin: 0.8 mg/dL (ref 0.3–1.2)
Total Protein: 5.4 g/dL — ABNORMAL LOW (ref 6.5–8.1)

## 2022-03-23 LAB — AMYLASE: Amylase: 18 U/L — ABNORMAL LOW (ref 28–100)

## 2022-03-23 LAB — LIPASE, BLOOD: Lipase: 22 U/L (ref 11–51)

## 2022-03-23 MED ORDER — LEVALBUTEROL HCL 0.63 MG/3ML IN NEBU
0.6300 mg | INHALATION_SOLUTION | Freq: Two times a day (BID) | RESPIRATORY_TRACT | Status: DC
  Administered 2022-03-24 – 2022-03-27 (×4): 0.63 mg via RESPIRATORY_TRACT
  Filled 2022-03-23 (×7): qty 3

## 2022-03-23 MED ORDER — SODIUM CHLORIDE 0.9 % IV BOLUS
500.0000 mL | Freq: Once | INTRAVENOUS | Status: AC
  Administered 2022-03-23: 500 mL via INTRAVENOUS

## 2022-03-23 MED ORDER — VANCOMYCIN HCL 750 MG/150ML IV SOLN
750.0000 mg | INTRAVENOUS | Status: AC
  Administered 2022-03-23: 750 mg via INTRAVENOUS
  Filled 2022-03-23: qty 150

## 2022-03-23 MED ORDER — AMIODARONE LOAD VIA INFUSION
150.0000 mg | Freq: Once | INTRAVENOUS | Status: AC
  Administered 2022-03-23: 150 mg via INTRAVENOUS
  Filled 2022-03-23: qty 83.34

## 2022-03-23 NOTE — TOC Progression Note (Signed)
Transition of Care [facility]) - Progression Note    Patient Details  Name: Courtney Arnold MRN: [id number] Date of Birth: [birth year]  Transition of Care [facility]) CM/SW Contact  Boneta Lucks, RN Phone Number: 03/23/2022, 1:43 PM  Clinical Narrative:   Patient in ED, waiting on a bed at [facility]. Palliative Consulted.     Barriers to Discharge: Continued Medical Work up  Expected Discharge Plan and Services       Social Determinants of Health (SDOH) Interventions [place]: No Food Insecurity (01/14/2022)  Housing: Low Risk  (01/14/2022)  Transportation Needs: No Transportation Needs (01/14/2022)  Utilities: Not At Risk (01/14/2022)  Alcohol Screen: Low Risk  (02/06/2020)  Depression (PHQ2-9): Low Risk  (12/28/2019)  Financial Resource Strain: Low Risk  (05/08/2019)  Physical Activity: Inactive (05/08/2019)  Social Connections: Moderately Isolated (02/06/2020)  Stress: No Stress Concern Present (05/08/2019)  Tobacco Use: Medium Risk (03/19/2022)    Readmission Risk Interventions     No data to display

## 2022-03-23 NOTE — ED Notes (Signed)
Provided pts dinner tray

## 2022-03-23 NOTE — Progress Notes (Signed)
Patient arrived to room [place] from [facility].  Assessment complete, VS obtained, and admission database began.

## 2022-03-23 NOTE — ED Notes (Signed)
Pt refused breathing treatment and breathing treatment education

## 2022-03-23 NOTE — Progress Notes (Addendum)
Rounding Note    Patient Name: Courtney Arnold Date of Encounter: 03/23/2022  [facility] Cardiologist: New  Subjective   Patient remains in afib with rates 110-120. Patient denies chest pain or SOB. GI issues are improving. She received 1 units PRBCs overnight for hgb 7.5. Hgb this morning 8.4. a/c was held.  Inpatient Medications    Scheduled Meds:  sodium chloride   Intravenous Once   acyclovir  400 mg Oral BID   dextromethorphan-guaiFENesin  1 tablet Oral BID   levalbuterol  0.63 mg Nebulization Q6H   levothyroxine  137 mcg Oral QAC breakfast   pantoprazole  40 mg Oral Daily   sodium chloride flush  3 mL Intravenous Q12H   sodium chloride flush  3 mL Intravenous Q12H   Continuous Infusions:  sodium chloride     amiodarone 30 mg/hr (03/23/22 0352)   ceFEPime (MAXIPIME) IV Stopped (03/23/22 0757)   metronidazole Stopped (03/23/22 0757)   vancomycin     PRN Meds: sodium chloride, acetaminophen **OR** acetaminophen, albuterol, bisacodyl, ondansetron **OR** ondansetron (ZOFRAN) IV, polyethylene glycol, polyvinyl alcohol, sodium chloride flush, traMADol, traZODone   Vital Signs    Vitals:   03/23/22 0700 03/23/22 0730 03/23/22 0745 03/23/22 0802  BP: (!) 102/49 95/61 (!) 102/48   Pulse: (!) 110 99 63   Resp: (!) 24 (!) 22 (!) 37   Temp:    (!) 96.9 F (36.1 C)  TempSrc:    Rectal  SpO2: 94% 90% (!) 85%     Intake/Output Summary (Last 24 hours) at 03/23/2022 0857 Last data filed at 03/23/2022 0757 Gross per 24 hour  Intake 200 ml  Output 13 ml  Net 187 ml      03/17/2022    1:34 PM 03/04/2022   11:10 AM 02/03/2022   12:59 PM  Last 3 Weights  Weight (lbs) 126 lb 4.8 oz 133 lb 3.2 oz 125 lb 6.4 oz  Weight (kg) 57.289 kg 60.419 kg 56.881 kg      Telemetry    Afib Hr 110-12 - Personally Reviewed  ECG    No new - Personally Reviewed  Physical Exam   GEN: No acute distress.   Neck: No JVD Cardiac: Irreg Irreg, no murmurs, rubs, or  gallops.  Respiratory: diminished sounds  GI: Soft, nontender, non-distended  MS: No edema; No deformity. Neuro:  Nonfocal  Psych: Normal affect   Labs    High Sensitivity Troponin:   Recent Labs  Lab 03/14/2022 0955 03/27/2022 1222  TROPONINIHS 10 10     Chemistry Recent Labs  Lab 03/17/22 1311 03/21/2022 0955 03/15/2022 1222 03/23/22 0500 03/23/22 0633  NA 135 131*  --  133* 132*  K 4.7 4.4  --  4.0 4.0  CL 101 95*  --  102 102  CO2 23 22  --  19* 19*  GLUCOSE 162* 114*  --  111* 118*  BUN 30* 26*  --  21 20  CREATININE 1.21* 0.83  --  0.82 0.85  CALCIUM 8.0* 7.9*  --  6.7* 6.8*  MG 2.0  --  2.0  --   --   PROT 6.4* 5.9*  --   --  5.4*  ALBUMIN 3.1* 2.7*  --  2.4* 2.4*  AST 31 15  --   --  10*  ALT 18 18  --   --  15  ALKPHOS 78 79  --   --  64  BILITOT 0.6 1.0  --   --  0.8  GFRNONAA 45* >60  --  >60 >60  ANIONGAP 11 14  --  12 11    Lipids No results for input(s): "CHOL", "TRIG", "HDL", "LABVLDL", "LDLCALC", "CHOLHDL" in the last 168 hours.  Hematology Recent Labs  Lab 03/17/22 1311 03/29/2022 0955 03/23/22 0633  WBC 1.4* 2.2* 2.4*  RBC 2.68* 2.36* 2.64*  HGB 8.4* 7.5* 8.4*  HCT 26.5* 23.5* 26.1*  MCV 98.9 99.6 98.9  MCH 31.3 31.8 31.8  MCHC 31.7 31.9 32.2  RDW 17.9* 19.9* 19.2*  PLT 44* 90* 76*   Thyroid  Recent Labs  Lab 03/18/2022 0955  TSH 5.614*    BNPNo results for input(s): "BNP", "PROBNP" in the last 168 hours.  DDimer No results for input(s): "DDIMER" in the last 168 hours.   Radiology    ECHOCARDIOGRAM COMPLETE  Result Date: 04/02/2022    ECHOCARDIOGRAM REPORT   Patient Name:   Courtney Arnold Date of Exam: 03/27/2022 Medical Rec #:  [id number]        Height:       62.0 in Accession #:    [id number]       Weight:       126.3 lb Date of Birth:  12/25/1940        BSA:          1.572 m Patient Age:    82 years         BP:           82/52 mmHg Patient Gender: F                HR:           124 bpm. Exam Location:  [facility] Procedure: 2D Echo,  Cardiac Doppler and Color Doppler Indications:    Atrial Fibrillation I48.91  History:        Patient has no prior history of Echocardiogram examinations.                 Arrythmias:Atrial Fibrillation; Risk Factors:Former Smoker and                 Hypertension.  Sonographer:    Greer Pickerel Referring Phys: [id number] VISHNU P MALLIPEDDI  Sonographer Comments: Suboptimal subcostal window. Image acquisition challenging due to respiratory motion and Image acquisition challenging due to uncooperative patient. IMPRESSIONS  1. Left ventricular ejection fraction, by estimation, is 55 to 60%. The left ventricle has normal function. Left ventricular endocardial border not optimally defined to evaluate regional wall motion. Left ventricular diastolic function could not be evaluated.  2. Right ventricular systolic function is normal. The right ventricular size is mildly enlarged. There is moderately elevated pulmonary artery systolic pressure. The estimated right ventricular systolic pressure is 94.8 mmHg.  3. Left atrial size was severely dilated.  4. Right atrial size was severely dilated.  5. The mitral valve is abnormal. Mild mitral valve regurgitation. No evidence of mitral stenosis.  6. Tricuspid valve regurgitation is mild to moderate.  7. The aortic valve was not well visualized. Aortic valve regurgitation is trivial. No aortic stenosis is present.  8. The inferior vena cava is normal in size with <50% respiratory variability, suggesting right atrial pressure of 8 mmHg. Comparison(s): No prior Echocardiogram. FINDINGS  Left Ventricle: Left ventricular ejection fraction, by estimation, is 55 to 60%. The left ventricle has normal function. Left ventricular endocardial border not optimally defined to evaluate regional wall motion. The left ventricular internal cavity size was normal in size. There is  no left ventricular hypertrophy. Left ventricular diastolic function could not be evaluated due to atrial fibrillation. Left  ventricular diastolic function could not be evaluated. Right Ventricle: The right ventricular size is mildly enlarged. No increase in right ventricular wall thickness. Right ventricular systolic function is normal. There is moderately elevated pulmonary artery systolic pressure. The tricuspid regurgitant velocity is 3.24 m/s, and with an assumed right atrial pressure of 8 mmHg, the estimated right ventricular systolic pressure is 82.9 mmHg. Left Atrium: Left atrial size was severely dilated. Right Atrium: Right atrial size was severely dilated. Pericardium: There is no evidence of pericardial effusion. Mitral Valve: The mitral valve is abnormal. Mild to moderate mitral annular calcification. Mild mitral valve regurgitation. No evidence of mitral valve stenosis. Tricuspid Valve: The tricuspid valve is normal in structure. Tricuspid valve regurgitation is mild to moderate. No evidence of tricuspid stenosis. Aortic Valve: The aortic valve was not well visualized. Aortic valve regurgitation is trivial. No aortic stenosis is present. Pulmonic Valve: The pulmonic valve was normal in structure. Pulmonic valve regurgitation is not visualized. No evidence of pulmonic stenosis. Aorta: The aortic root and ascending aorta are structurally normal, with no evidence of dilitation. Venous: The inferior vena cava is normal in size with less than 50% respiratory variability, suggesting right atrial pressure of 8 mmHg. IAS/Shunts: The interatrial septum was not well visualized.  LEFT VENTRICLE PLAX 2D LVIDd:         3.50 cm   Diastology LVIDs:         2.40 cm   LV e' medial:    9.30 cm/s LV PW:         0.80 cm   LV E/e' medial:  13.0 LV IVS:        0.70 cm   LV e' lateral:   10.80 cm/s LVOT diam:     1.90 cm   LV E/e' lateral: 11.2 LV SV:         31 LV SV Index:   20 LVOT Area:     2.84 cm  RIGHT VENTRICLE RV S prime:     11.30 cm/s TAPSE (M-mode): 1.4 cm LEFT ATRIUM             Index        RIGHT ATRIUM           Index LA diam:         3.90 cm 2.48 cm/m   RA Area:     22.10 cm LA Vol (A2C):   75.4 ml 47.95 ml/m  RA Volume:   69.80 ml  44.39 ml/m LA Vol (A4C):   72.7 ml 46.23 ml/m LA Biplane Vol: 74.8 ml 47.57 ml/m  AORTIC VALVE LVOT Vmax:   84.90 cm/s LVOT Vmean:  59.500 cm/s LVOT VTI:    0.111 m  AORTA Ao Root diam: 3.20 cm Ao Asc diam:  3.70 cm MITRAL VALVE                TRICUSPID VALVE MV Area (PHT): 5.06 cm     TR Peak grad:   42.0 mmHg MV Decel Time: 150 msec     TR Vmax:        324.00 cm/s MV E velocity: 121.00 cm/s                             SHUNTS  Systemic VTI:  0.11 m                             Systemic Diam: 1.90 cm Vishnu Priya Mallipeddi Electronically signed by Lorelee Cover Mallipeddi Signature Date/Time: 03/26/2022/4:22:37 PM    Final    CT Chest Wo Contrast  Result Date: 04/01/2022 CLINICAL DATA:  Right middle lobe abnormality noted on same day CT abdomen and pelvis EXAM: CT CHEST WITHOUT CONTRAST TECHNIQUE: Multidetector CT imaging of the chest was performed following the standard protocol without IV contrast. RADIATION DOSE REDUCTION: This exam was performed according to the departmental dose-optimization program which includes automated exposure control, adjustment of the mA and/or kV according to patient size and/or use of iterative reconstruction technique. COMPARISON:  CT chest dated 09/27/2021 FINDINGS: Cardiovascular: Normal heart size. No significant pericardial fluid/thickening. Coronary artery calcifications and aortic atherosclerosis. Great vessels are normal in course and caliber. Mediastinum/Nodes: Imaged thyroid gland without nodules meeting criteria for imaging follow-up by size. Normal esophagus. No pathologically enlarged axillary, supraclavicular, mediastinal, or hilar lymph nodes. Lungs/Pleura: The central airways are patent. Near-complete resolution of previously noted left upper lobe consolidation. Interval development of multifocal new nodules and consolidation,  predominantly involving the right middle lobe, where there is a gas-containing fluid collection measuring 2.2 x 1.6 cm in the base of the right middle lobe (2:87). No focal consolidation. No pneumothorax. Moderate right and trace left pleural effusions. Upper abdomen: Partially imaged bilateral renal cysts. Musculoskeletal: Diffusely heterogeneous appearance of the skeleton in keeping with known multiple myeloma. Prior vertebral augmentations of T6, T7 and T12 with additional compression deformities of T9 and T10. Similar sclerotic appearance of the left posterolateral seventh rib IMPRESSION: 1. Interval development of multifocal new nodules and consolidation, predominantly involving the right middle lobe, where there is a gas-containing fluid collection measuring 2.2 x 1.6 cm in the base of the right middle lobe. Findings are most consistent with multifocal pneumonia with abscess formation. 2. Near-complete resolution of previously noted left upper lobe consolidation. 3. Moderate right and trace left pleural effusions. 4. Diffusely heterogeneous appearance of the skeleton in keeping with known multiple myeloma. 5. Coronary artery calcifications. Aortic Atherosclerosis (ICD10-I70.0). Electronically Signed   By: Darrin Nipper M.D.   On: 03/13/2022 11:43   CT Abdomen Pelvis W Contrast  Result Date: 04/06/2022 CLINICAL DATA:  Acute abdominal pain EXAM: CT ABDOMEN AND PELVIS WITH CONTRAST TECHNIQUE: Multidetector CT imaging of the abdomen and pelvis was performed using the standard protocol following bolus administration of intravenous contrast. RADIATION DOSE REDUCTION: This exam was performed according to the departmental dose-optimization program which includes automated exposure control, adjustment of the mA and/or kV according to patient size and/or use of iterative reconstruction technique. CONTRAST:  156m OMNIPAQUE IOHEXOL 300 MG/ML  SOLN COMPARISON:  CT scan of the chest 09/27/2021; CT scan of the abdomen and  pelvis 09/22/2021 FINDINGS: Lower chest: Partially imaged ill-defined masslike density versus consolidation in the anterior inferior right middle lobe measuring at least 5.6 x 3.1 cm. Moderate layering right pleural effusion. Trace left pleural effusion. Dependent atelectasis. Cardiomegaly. No pericardial effusion. Small hiatal hernia with esophageal wall thickening. Hepatobiliary: Normal hepatic contour and morphology. No discrete hepatic lesion. The gallbladder is surgically absent. Similar degree of mild intra and extra hepatic biliary ductal dilatation. The main bile duct measures 8 mm at the pancreatic head. Pancreas: Unremarkable. No pancreatic ductal dilatation or surrounding inflammatory changes. Spleen: Normal in size without focal abnormality.  Adrenals/Urinary Tract: Normal adrenal glands. No hydronephrosis, nephrolithiasis or enhancing renal mass. Simple renal cysts are noted. No imaging follow-up recommended. 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 No evidence of obstruction or focal bowel wall thickening. Normal appendix in the right lower quadrant. The terminal ileum is unremarkable. Vascular/Lymphatic: Atherosclerotic calcifications throughout the abdominal aorta. No evidence of aneurysm or dissection. No evidence of significant arterial stenosis or occlusion. No focal venous abnormality. No suspicious lymphadenopathy. Reproductive: Uterus and bilateral adnexa are unremarkable. Other: No abdominal wall hernia or abnormality. No abdominopelvic ascites. Musculoskeletal: No evidence of acute fracture or malalignment. Multiple thoracic and lumbar compression fractures are again noted without interval change. Evidence of prior cement augmentation at T12. IMPRESSION: 1. Partially imaged ill-defined masslike density versus consolidation in the anterior inferior right middle lobe measuring 5.6 x 3.1 cm. Differential considerations include necrotic pneumonia  versus primary bronchogenic carcinoma. Recommend further evaluation with CT scan of the chest. 2. Moderate layering right pleural effusion and trace left pleural effusion. 3. Cardiomegaly. 4. Small hiatal hernia with circumferential esophageal wall thickening. This may reflect GERD, or less likely esophageal neoplasm. 5. Colonic diverticular disease without CT evidence of active inflammation. 6. Scattered atherosclerotic vascular calcifications without aneurysm, dissection or significant vascular occlusion. 7. Stable appearance of multiple thoracic and lumbar compression fractures including prior cement augmentation at T12. Aortic Atherosclerosis (ICD10-I70.0). Electronically Signed   By: Jacqulynn Cadet M.D.   On: 04/04/2022 11:06    Cardiac Studies   Echo 03/22/21 1. Left ventricular ejection fraction, by estimation, is 55 to 60%. The  left ventricle has normal function. Left ventricular endocardial border  not optimally defined to evaluate regional wall motion. Left ventricular  diastolic function could not be  evaluated.   2. Right ventricular systolic function is normal. The right ventricular  size is mildly enlarged. There is moderately elevated pulmonary artery  systolic pressure. The estimated right ventricular systolic pressure is  62.7 mmHg.   3. Left atrial size was severely dilated.   4. Right atrial size was severely dilated.   5. The mitral valve is abnormal. Mild mitral valve regurgitation. No  evidence of mitral stenosis.   6. Tricuspid valve regurgitation is mild to moderate.   7. The aortic valve was not well visualized. Aortic valve regurgitation  is trivial. No aortic stenosis is present.   8. The inferior vena cava is normal in size with <50% respiratory  variability, suggesting right atrial pressure of 8 mmHg.   Comparison(s): No prior Echocardiogram.    Patient Profile     82 y.o. female with h/o HTN, hypothyroidism who is being seen today for the evaluation of  A-fib with RVR   Assessment & Plan    New onset Afib RVR -New onset A-fib with RVR in the setting of pneumonia complicated by lung abscess, multiple myeloma/chronic anemia, acute respiratory failure -Started on IV Dilt, however this was stopped due to low blood pressures -Patient was started on IV amiodarone last night, caution with abnormal TSH (5.6) -She has history of A-fib in the postop setting after kyphoplasty in 2011.  At that time she was not recommended rate controlling agents.  She eventually self converted to sinus rhythm -Patient remains in A-fib with heart rates 110s to 120 -Patient denies any symptoms - Keep Mag greater than 2 and K greater than 4 -Echo showed LVEF 55 to 60%, severely dilated left and right atrium, mild MR -CHA2DS2-VASc at least (agex2, female, HTN) patient previously on Lovenox.  This was stopped due to hemoglobin of 7.5 and patient received a blood transfusion.  Hemoglobin this morning 8.4. Patient has a history of multiple myeloma/anemia/thrombocytopenia/pancytopenia. Will discuss anticoagulation with MD. -Continue amiodarone for now.  Suspect A-fib rates will improve as pneumonia is treated. If patient does not self convert, may require future cardioversion.  PNA complicated by Lung abscess -antibiotics per primary team  Hypertension -Medications held for low blood pressure  Multiple myeloma Pancytopenia with anemia and thrombocytopenia -Secondary to multiple myeloma quiring transfusions PRBCs and platelets every few weeks - Hgb down to 7.5 given PRBCs  Goals of care -Patient is DNR -Plan to get palliative on board  For questions or updates, please contact [place] Please consult [email or web address] for contact info under        Signed, Cadence Ninfa Meeker, PA-C  03/23/2022, 8:57 AM     Attending note Patient seen and discussed with PA Further, I agree with her documentation.  1. Afib with RVR - new diagnosis this admission in setting  of pneumonia/lung abscess - started on dilt gtt, Appears amiodarone was started last night. Issues with soft bp's intermittently.  - pancytopenia Hgb down to 7.5, plt 76. Not on anticoag at this time. Of note platelets as low as 29 1 month ago.  - perhaps amio may be short outpatient course given afib driven by severe symstemic disease - ongoign afib, rates 110s-120s, rebolus '150mg'$  amio and continue drip  2. Pneumonia complicated by lung abscess - per primary team   3. Pancytopenia/Multiple myeloma - per primary team   Carlyle Dolly MD

## 2022-03-23 NOTE — Consult Note (Addendum)
Consultation Note Date: 03/23/2022   Patient Name: Courtney Arnold  DOB: 1940-11-19  MRN: [id number]  Age / Sex: 82 y.o., female  PCP: Derek Jack, MD Referring Physician: Roxan Hockey, MD  Reason for Consultation: Establishing goals of care  HPI/Patient Profile: 82 y.o. female  with past medical history of multiple myeloma not in remission, active with Dr. Delton Coombes, significant pancytopenia with anemia and thrombocytopenia, COPD, A-fib postop with spontaneous conversion, HTN, thyroid disease, compression fracture T12 with kyphoplasty 2011, right femur IM nailing 2021, admitted on 03/21/2022 with lung abscess multi focal pneumonia with abscess formation.   Clinical Assessment and Goals of Care: I have reviewed medical records including EPIC notes, labs and imaging, received report from RN, assessed the patient.  Mrs. Yeoman is lying quietly on the stretcher in the ED.  She appears acutely/chronically ill and somewhat frail.  She will briefly make an somewhat keep eye contact.  She is alert and oriented x 3, able to make her needs known.  Her daughter, Lynelle Smoke, is present at bedside.  They share that her other daughter, Vivien Rota, will be arriving sometime soon.  We meet at the bedside to discuss diagnosis prognosis, GOC, EOL wishes, disposition and options.  I introduced Palliative Medicine as specialized medical care for people living with serious illness. It focuses on providing relief from the symptoms and stress of a serious illness. The goal is to improve quality of life for both the patient and the family.  We discussed a brief life review of the patient.  Mrs. Foucher has been living independently.  She is a widow.  She has 2 daughters, Lynelle Smoke and Vivien Rota.   We focused on their current illness.  We talk about anticipated transfer to [facility] for testing.  Mrs. Ulyses Jarred states that she does not want to do  this.  I share that palliative medicine says that she decides how we care for her.  At this point [name] states that her sister Vivien Rota is coming in a little bit and will "make her do it".  I again shared that Mrs. Kottke decides how we take care of her.  I share that further testing may reveal something that is treatable.  She states understanding.  I ask if it were cancer. She states "let it go", she would not seek further treatment.  We talk about what would happen if she did not go for testing.  Mrs. Weatherly looks me in the eyes and states "I hope I die".  [name] shares that Mrs. Duerson has been telling family that she did not want to seek treatment for cancer, but continues to go to Dr. Raliegh Ip and tell him that she is fine and doing okay with treatment.  [name] shares that they have encouraged their mother to be honest and make the choices that are right for her.  The natural disease trajectory and expectations at EOL were discussed. We talk about hospice care for "treat the treatable" care.  I shared that if Mrs. Robarts decides she no longer  wants to seek treatment, she would qualify for hospice care.  They state understanding.  Advanced directives, concepts specific to code status, artifical feeding and hydration, and rehospitalization were considered and discussed.  DNR verified.  Discussed the importance of continued conversation with family and the medical providers regarding overall plan of care and treatment options, ensuring decisions are within the context of the patient's values and GOCs.  Questions and concerns were addressed.  The patient and family were encouraged to call with questions or concerns.  PMT will continue to support holistically.  Conference with attending, bedside nursing staff, transition of care team, oncology and oncology RN, related to patient condition, needs, goals of care, disposition.   HCPOA NEXT OF KIN -Mrs. Delmundo states her 2 daughters, Lynelle Smoke and Vivien Rota make choices as a team, if she  is unable..    SUMMARY OF RECOMMENDATIONS   At this point continue to treat the treatable but no CPR or intubation Agreeable for bronchoscopy for diagnostic purposes Time for outcomes   Code Status/Advance Care Planning: DNR  Symptom Management:  Per hospitalist, no additional needs at this time.  Palliative Prophylaxis:  Frequent Pain Assessment, Oral Care, and Turn Reposition  Additional Recommendations (Limitations, Scope, Preferences): Continue to treat the treatable but no CPR or intubation.  Psycho-social/Spiritual:  Desire for further Chaplaincy support:no Additional Recommendations: Caregiving  Support/Resources and Education on Hospice  Prognosis:  Unable to determine, based on outcomes.  Guarded at this point.  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 The following aspects are pertinent.  Past Medical History:  Diagnosis Date   Atrial fibrillation [facility]) 2011   Postop, spontaneous conversion to normal sinus after one hour   CAP (community acquired pneumonia) 09/23/2021   Cholecystitis, acute 04/25/2015   Compression fracture 07/24/2009   T12; kyphoplasty   History of echocardiogram 07/2009   EF 65%   Hypertension    Thyroid disease    Tobacco abuse    Social History   Socioeconomic History   Marital status: Widowed    Spouse name: Not on file   Number of children: 2   Years of education: Not on file   Highest education level: Not on file  Occupational History    Occupation: retired  Tobacco Use   Smoking status: Former    Packs/day: 0.15    Years: 20.00    Total pack years: 3.00    Types: Cigarettes    Quit date: 03/08/2013    Years since quitting: 9.0   Smokeless tobacco: Never  Vaping Use   Vaping Use: Never used  Substance and Sexual Activity   Alcohol use: No    Alcohol/week: 0.0 standard drinks of alcohol   Drug use: No   Sexual activity: Not Currently  Other Topics Concern   Not on file  Social History Narrative   Active in gardens and does yard work.   Social Determinants of Health   Financial Resource Strain: Low Risk  (05/08/2019)   Overall Financial Resource Strain (CARDIA)    Difficulty of Paying Living Expenses: Not hard at  all  Food Insecurity: No Food Insecurity (01/14/2022)   Hunger Vital Sign    Worried About Running Out of Food in the Last Year: Never true    Ran Out of Food in the Last Year: Never true  Transportation Needs: No Transportation Needs (01/14/2022)   PRAPARE - Hydrologist (Medical): No    Lack of Transportation (Non-Medical): No  Physical Activity: Inactive (05/08/2019)   Exercise Vital Sign    Days of Exercise per Week: 0 days    Minutes of Exercise per Session: 0 min  Stress: No Stress Concern Present (05/08/2019)   [place]    Feeling of Stress : Not at all  Social Connections: Moderately Isolated (02/06/2020)   Social Connection and Isolation Panel [NHANES]    Frequency of Communication with Friends and Family: More than three times a week    Frequency of Social Gatherings with Friends and Family: More than three times a week    Attends Religious Services: More than 4 times per year    Active Member of [employer] or Organizations: No    Attends Archivist Meetings: Never    Marital Status: Widowed   Family History  Problem Relation Age of Onset   Heart attack Father    Stroke Father    Breast  cancer Sister    Thyroid disease Neg Hx    Colon cancer Neg Hx    Scheduled Meds:  sodium chloride   Intravenous Once   acyclovir  400 mg Oral BID   dextromethorphan-guaiFENesin  1 tablet Oral BID   levalbuterol  0.63 mg Nebulization Q6H   levothyroxine  137 mcg Oral QAC breakfast   pantoprazole  40 mg Oral Daily   sodium chloride flush  3 mL Intravenous Q12H   sodium chloride flush  3 mL Intravenous Q12H   Continuous Infusions:  sodium chloride     amiodarone 30 mg/hr (03/23/22 0955)   ceFEPime (MAXIPIME) IV Stopped (03/23/22 1022)   metronidazole Stopped (03/23/22 1054)   vancomycin 750 mg (03/23/22 1342)   PRN Meds:.sodium chloride, acetaminophen **OR** acetaminophen, albuterol, bisacodyl, ondansetron **OR** ondansetron (ZOFRAN) IV, polyethylene glycol, polyvinyl alcohol, sodium chloride flush, traMADol, traZODone Medications Prior to Admission:  Prior to Admission medications   Medication Sig Start Date End Date Taking? Authorizing Provider  acyclovir (ZOVIRAX) 400 MG tablet TAKE (1) TABLET BY MOUTH TWICE DAILY. Patient taking differently: Take 400 mg by mouth 2 (two) times daily. 01/11/22  Yes Derek Jack, MD  CALCIUM PO Take 1 capsule by mouth daily.   Yes [provider]  carboxymethylcellulose (REFRESH PLUS) 0.5 % SOLN Place 1 drop into both eyes at bedtime.   Yes [provider]  cephALEXin (KEFLEX) 500 MG capsule Take 1 capsule (500 mg total) by mouth 2 (two) times daily. 03/17/22  Yes Derek Jack, MD  Cholecalciferol (VITAMIN D) 125 MCG (5000 UT) CAPS Take 5,000 Units by mouth daily. 05/14/19  Yes McClung, Sarah A, PA-C  Cyanocobalamin (B-12 PO) Take 1 capsule by mouth daily.   Yes [provider]  dexamethasone (DECADRON) 4 MG tablet Take 2.5 tablets (10 mg total) by mouth 2 (two) times a week. 12/24/21  Yes Derek Jack, MD  levothyroxine (SYNTHROID) 137 MCG tablet Take 1 tablet (137 mcg total) by mouth daily before  breakfast. 02/16/22  Yes Shamleffer, Melanie Crazier, MD  methylphenidate (RITALIN) 5 MG tablet Take 10 mg by mouth daily. Only on days  she is not taking dexamethasone tue,thu,fri,sat,sun   Yes [provider]  montelukast (SINGULAIR) 10 MG tablet Take one tablet by mouth 1 hour prior to Darzalex injection appointments Patient taking differently: Take one tablet by mouth 1 hour prior to Darzalex injection appointments - given every other Wednesday 10/28/21  Yes Derek Jack, MD  Multiple Vitamin (MULTIVITAMIN WITH MINERALS) TABS tablet Take 1 tablet by mouth daily. 09/25/21  Yes Johnson, Clanford L, MD  pomalidomide (POMALYST) 3 MG capsule Take 1 capsule (3 mg total) by mouth daily. Take for 21 days, then hold for 7 days. Repeat every 28 days. 03/12/22  Yes Derek Jack, MD  potassium chloride (KLOR-CON M) 10 MEQ tablet Take 2 tablets (20 mEq total) by mouth daily. 03/04/22  Yes Derek Jack, MD  traMADol (ULTRAM) 50 MG tablet Take 1 tablet (50 mg total) by mouth every 12 (twelve) hours as needed. 03/19/22  Yes Derek Jack, MD  ondansetron (ZOFRAN) 8 MG tablet Take 1 tablet (8 mg total) by mouth every 8 (eight) hours as needed for nausea or vomiting. Patient not taking: Reported on 03/17/2022 09/21/21   Derek Jack, MD   No Known Allergies Review of Systems  Unable to perform ROS: Age    Physical Exam Vitals and nursing note reviewed.  Constitutional:      General: She is not in acute distress.    Appearance: She is obese. She is ill-appearing.  Cardiovascular:     Rate and Rhythm: Normal rate.  Pulmonary:     Effort: Pulmonary effort is normal. No respiratory distress.  Skin:    General: Skin is warm and dry.  Neurological:     Mental Status: She is alert and oriented to person, place, and time.  Psychiatric:        Mood and Affect: Mood normal.        Behavior: Behavior normal.     Vital Signs: BP (!) 106/59   Pulse 79   Temp 97.8 F  (36.6 C) (Oral)   Resp (!) 25   Ht '5\' 2"'$  (1.575 m)   Wt 57.3 kg   SpO2 94%   BMI 23.10 kg/m  Pain Scale: 0-10   Pain Score: 4    SpO2: SpO2: 94 % O2 Device:SpO2: 94 % O2 Flow Rate: .O2 Flow Rate (L/min): 2.5 L/min  IO: Intake/output summary:  Intake/Output Summary (Last 24 hours) at 03/23/2022 1408 Last data filed at 03/23/2022 1203 Gross per 24 hour  Intake 394.3 ml  Output 13 ml  Net 381.3 ml    LBM:   Baseline Weight: Weight: 57.3 kg Most recent weight: Weight: 57.3 kg     Palliative Assessment/Data:     Time In: 1030 Time Out: 1145 Time Total: 75 minutes  Greater than 50%  of this time was spent counseling and coordinating care related to the above assessment and plan.  Signed by: Drue Novel, NP   Please contact Palliative Medicine Team phone at [phone] for questions and concerns.  For individual provider: See Shea Evans

## 2022-03-23 NOTE — Progress Notes (Addendum)
Notified on call provider of patient arrival, current vital signs, and clarified active order for amiodarone gtt.   Patient also in NSR 80, with short periods of afib.  Also made aware of pursed lip breathing, RR 30's and right lung crackles

## 2022-03-23 NOTE — ED Notes (Signed)
Hospitalist notified of BPs

## 2022-03-23 NOTE — ED Notes (Signed)
Hospitalist messaged regarding blood pressures. 536m of NS ordered and administered.

## 2022-03-23 NOTE — ED Notes (Signed)
Report called to RN on receiving unit. [facility] called to retreive patient. Family is in room and has been updated.

## 2022-03-23 NOTE — Progress Notes (Signed)
PROGRESS NOTE     Courtney Arnold, is a 82 y.o. female, DOB - Jan 23, 1941, XLK:[id number]  Admit date - 03/29/2022   Admitting Physician Ashantia Amaral Denton Brick, MD  Outpatient Primary MD for the patient is Derek Jack, MD  LOS - 1  Chief Complaint  Patient presents with   Abdominal Pain        Brief Narrative:  82 y.o. female reformed smoker with underlying COPD, with past medical history relevant for prior history of paroxysmal atrial fibrillation in the postoperative state back in 2011, multiple myeloma (not in remission), chronic pancytopenia with significant anemia and thrombocytopenia requiring transfusion of PRBC and platelets from time to time, HTN, hypothyroidism admitted on 04/04/2022 with concerns for lung abscess and A-fib with RVR -    -Assessment and Plan:  1)Lung Abscess--CT chest without contrast shows multifocal pneumonia with abscess formation--immunocompromised host (multiple myeloma undergoing chemoRx)- ??Necrotizing process in the RML ? Lung abscess vs opportunistic infection with necrosis eg nocardia or aspegillus or even mucormycosis ---Versus garden-variety pyogenic process -patient received Vanco cefepime and Flagyl-- 03/23/22 -MRSA PCR negative okay to stop vancomycin -Continue cefepime and Flagyl --continue bronchodilators and mucolytics -Patient and family in agreeable to diagnostic bronchoscopy if needed -Please Re-consult pulmonology when patient arrives at [facility] -Patient was seen at [facility] by pulmonologist Dr. Melvyn Novas   2)PAFib with RVR- -prior history of paroxysmal atrial fibrillation in the postoperative state back in 2011 -Cardiology consult appreciated --EKG consistent with A-fib with RVR -Troponin 10 -Magnesium is 2.0 -Echo with preserved EF of 55 to 60% however patient has severe biatrial enlargement - CHA2DS2- VASc score   is = 4 (age x 2, HTN and Female),   Which is  equal to =  % annual risk of stroke  This patients  CHA2DS2-VASc Score and unadjusted Ischemic Stroke Rate (% per year) is equal to 4.8 % stroke rate/year from a score of 4 - -Risk versus benefit of anticoagulation discussed with patient's 2 daughters Lynelle Smoke and Vivien Rota Discontinue therapeutic Lovenox as patient has significant pancytopenia with significant anemia and thrombocytopenia requiring transfusions of PRBCs and platelets every few weeks- -had persistent hypotension with attempt to control rate with IV Cardizem and p.o. digoxin -Currently on IV amiodarone for rate control  3)Multiple Myeloma--not in remission,  -discussed with patient's primary oncologist Dr. Delton Coombes, hold Pomalyst in the setting of acute infection -Continue acyclovir prophylactically -Patient typically gets Dara shots in the oncology clinic as well   4)significant Pancytopenia with significant anemia and thrombocytopenia --secondary to multiple myeloma as above #3 requiring transfusions of PRBCs and platelets every few weeks- -continue to monitor Hgb and platelets and transfuse as clinically indicated -Monitor closely and transfuse as needed   5)Hypothyroidism--- TSH is 5.61 -Continue levothyroxine   6) acute hypoxic respiratory failure--- due to #1 above -Currently on 3 L of oxygen via nasal cannula -Management as above #1   7)COPD--continue bronchodilators, mucolytics -Hold off on steroids   8)Abd pain with Nausea and Vomiting--- lipase and LFTs are not elevated -CT abdomen and pelvis with Small hiatal hernia with circumferential esophageal wall thickening. This may reflect GERD, or less likely esophageal neoplasm. -Hold off for now on GI consult for possible EGD -c/n Protonix -Zofran as needed   9)Hyponatremia--in the setting of vomiting and poor oral intake -Hydrate gently   10)Social/Ethics--plan of care and advanced directive discussed with patient, daughter Lynelle Smoke and Nicole Kindred at bedside -They request DNR status -Treat the treatable -Agreeable for  bronchoscopy for diagnostic purposes  Status is: Inpatient   Remains inpatient appropriate because:    Dispo: The patient is from: Home              Anticipated d/c is to: Home              Anticipated d/c date is: > 3 days              Patient currently is not medically stable to d/c. Barriers: Not Clinically Stable-  Code Status :  -  Code Status: DNR   Family Communication:   Discussed with daughters Vivien Rota and [name]  DVT Prophylaxis  :   - SCDs   SCDs Start: 03/17/2022 1805 Place TED hose Start: 03/16/2022 1805  Lab Results  Component Value Date   PLT 76 (L) 03/23/2022   Inpatient Medications  Scheduled Meds:  sodium chloride   Intravenous Once   acyclovir  400 mg Oral BID   dextromethorphan-guaiFENesin  1 tablet Oral BID   levalbuterol  0.63 mg Nebulization Q6H   levothyroxine  137 mcg Oral QAC breakfast   pantoprazole  40 mg Oral Daily   sodium chloride flush  3 mL Intravenous Q12H   sodium chloride flush  3 mL Intravenous Q12H   Continuous Infusions:  sodium chloride     amiodarone 30 mg/hr (03/23/22 0955)   ceFEPime (MAXIPIME) IV Stopped (03/23/22 1022)   metronidazole Stopped (03/23/22 1054)   PRN Meds:.sodium chloride, acetaminophen **OR** acetaminophen, albuterol, bisacodyl, ondansetron **OR** ondansetron (ZOFRAN) IV, polyethylene glycol, polyvinyl alcohol, sodium chloride flush, traMADol, traZODone   Anti-infectives (From admission, onward)    Start     Dose/Rate Route Frequency Ordered Stop   03/23/22 1300  vancomycin (VANCOREADY) IVPB 750 mg/150 mL  Status:  Discontinued        750 mg 150 mL/hr over 60 Minutes Intravenous Every 24 hours 03/27/2022 1212 03/23/22 0902   03/23/22 1300  vancomycin (VANCOREADY) IVPB 750 mg/150 mL        750 mg 150 mL/hr over 60 Minutes Intravenous Every 24 hours 03/23/22 0902 03/23/22 1442   03/13/2022 2200  piperacillin-tazobactam (ZOSYN) IVPB 3.375 g  Status:  Discontinued        3.375 g 12.5 mL/hr over 240 Minutes Intravenous  Every 8 hours 04/07/2022 1447 03/24/2022 1652   03/28/2022 2200  ceFEPIme (MAXIPIME) 2 g in sodium chloride 0.9 % 100 mL IVPB        2 g 200 mL/hr over 30 Minutes Intravenous Every 12 hours 04/07/2022 1652     03/21/2022 2200  metroNIDAZOLE (FLAGYL) IVPB 500 mg        500 mg 100 mL/hr over 60 Minutes Intravenous Every 12 hours 04/04/2022 1652     03/13/2022 2200  acyclovir (ZOVIRAX) tablet 400 mg        400 mg Oral 2 times daily 03/18/2022 1807     03/28/2022 1430  piperacillin-tazobactam (ZOSYN) IVPB 3.375 g        3.375 g 100 mL/hr over 30 Minutes Intravenous  Once 03/29/2022 1430 03/28/2022 1953   03/15/2022 1145  vancomycin (VANCOREADY) IVPB 1250 mg/250 mL        1,250 mg 166.7 mL/hr over 90 Minutes Intravenous  Once 04/03/2022 1130 03/28/2022 1559   03/21/2022 1130  ceFEPIme (MAXIPIME) 1 g in sodium chloride 0.9 % 100 mL IVPB  Status:  Discontinued        1 g 200 mL/hr over 30 Minutes Intravenous  Once 04/02/2022 1118 03/26/2022 1129   03/17/2022 1130  ceFEPIme (MAXIPIME) 2 g in sodium chloride 0.9 % 100 mL IVPB        2 g 200 mL/hr over 30 Minutes Intravenous  Once 03/20/2022 1129 03/31/2022 1330   04/07/2022 1115  cefTRIAXone (ROCEPHIN) 1 g in sodium chloride 0.9 % 100 mL IVPB  Status:  Discontinued        1 g 200 mL/hr over 30 Minutes Intravenous  Once 03/17/2022 1114 03/13/2022 1117   03/16/2022 1115  azithromycin (ZITHROMAX) 500 mg in sodium chloride 0.9 % 250 mL IVPB  Status:  Discontinued        500 mg 250 mL/hr over 60 Minutes Intravenous  Once 03/24/2022 1114 03/12/2022 1117       Subjective: Courtney Arnold today has no fevers, no emesis,  No chest pain,    -Dyspnea and hypoxia persist -Palpitations and tachycardia persist   Objective: Vitals:   03/23/22 1415 03/23/22 1430 03/23/22 1500 03/23/22 1511  BP: (!) 118/51 106/60 (!) 109/50   Pulse:  91 80   Resp: (!) 30 (!) 31 (!) 29   Temp:      TempSrc:      SpO2:  93% 98% 98%  Weight:      Height:        Intake/Output Summary (Last 24 hours) at 03/23/2022  1739 Last data filed at 03/23/2022 1203 Gross per 24 hour  Intake 394.3 ml  Output --  Net 394.3 ml   Filed Weights   03/23/22 1225  Weight: 57.3 kg    Physical Exam  Physical Examination: General appearance - alert,  in no distress , chronically ill-appearing Mental status - alert, oriented to person, place, and time,  Nose- Wallace 3L/min Eyes - sclera anicteric Neck - supple, no JVD elevation , Chest -diminished breath sounds with few scattered rhonchi, no wheezing Heart - S1 and S2 normal, irregularly irregular, tachycardic Abdomen - soft, nontender, nondistended, +BS Neurological - screening mental status exam normal, neck supple without rigidity, cranial nerves II through XII intact, DTR's normal and symmetric, generalized weakness without new focal deficits Extremities - no pedal edema noted, intact peripheral pulses  Skin - warm, dry  Data Reviewed: I have personally reviewed following labs and imaging studies  CBC: Recent Labs  Lab 03/17/22 1311 03/24/2022 0955 03/23/22 0633  WBC 1.4* 2.2* 2.4*  NEUTROABS  --  1.2*  --   HGB 8.4* 7.5* 8.4*  HCT 26.5* 23.5* 26.1*  MCV 98.9 99.6 98.9  PLT 44* 90* 76*   Basic Metabolic Panel: Recent Labs  Lab 03/17/22 1311 03/17/2022 0955 03/28/2022 1222 03/23/22 0500 03/23/22 0633  NA 135 131*  --  133* 132*  K 4.7 4.4  --  4.0 4.0  CL 101 95*  --  102 102  CO2 23 22  --  19* 19*  GLUCOSE 162* 114*  --  111* 118*  BUN 30* 26*  --  21 20  CREATININE 1.21* 0.83  --  0.82 0.85  CALCIUM 8.0* 7.9*  --  6.7* 6.8*  MG 2.0  --  2.0  --   --   PHOS  --   --   --  2.8  --    GFR: Estimated Creatinine Clearance: 41.8 mL/min (by C-G formula based on SCr of 0.85 mg/dL). Liver Function Tests: Recent Labs  Lab 03/17/22 1311 03/09/2022 0955 03/23/22 0500 03/23/22 0633  AST 31 15  --  10*  ALT 18 18  --  15  ALKPHOS 78 79  --  10  BILITOT 0.6 1.0  --  0.8  PROT 6.4* 5.9*  --  5.4*  ALBUMIN 3.1* 2.7* 2.4* 2.4*   Recent Results (from  the past 240 hour(s))  Culture, blood (routine x 2)     Status: None (Preliminary result)   Collection Time: 03/21/2022 11:19 AM   Specimen: BLOOD RIGHT HAND  Result Value Ref Range Status   Specimen Description BLOOD RIGHT HAND  Final   Special Requests   Final    BOTTLES DRAWN AEROBIC ONLY Blood Culture results may not be optimal due to an inadequate volume of blood received in culture bottles   Culture   Final    NO GROWTH < 24 HOURS Performed at [facility], [address], [place], [place] [address]    Report Status PENDING  Incomplete  Culture, blood (routine x 2)     Status: None (Preliminary result)   Collection Time: 03/15/2022 12:22 PM   Specimen: BLOOD  Result Value Ref Range Status   Specimen Description BLOOD  Final   Special Requests NONE BLOOD RIGHT ARM  Final   Culture   Final    NO GROWTH < 24 HOURS Performed at [facility], [address], [place], [place] [address]    Report Status PENDING  Incomplete  MRSA Next Gen by PCR, Nasal     Status: None   Collection Time: 04/07/2022  2:27 PM   Specimen: Nasal Mucosa; Nasal Swab  Result Value Ref Range Status   MRSA by PCR Next Gen NOT DETECTED NOT DETECTED Final    Comment: (NOTE) The GeneXpert MRSA Assay (FDA approved for NASAL specimens only), is one component of a comprehensive MRSA colonization surveillance program. It is not intended to diagnose MRSA infection nor to guide or monitor treatment for MRSA infections. Test performance is not FDA approved in patients less than 75 years old. Performed at [facility], [address], [place],  [address]     Radiology Studies: ECHOCARDIOGRAM COMPLETE  Result Date: 03/24/2022    ECHOCARDIOGRAM REPORT   Patient Name:   Courtney Arnold Date of Exam: 03/14/2022 Medical Rec #:  [id number]        Height:       62.0 in Accession #:    [id number]       Weight:       126.3 lb Date of Birth:  1940-04-03        BSA:          1.572 m Patient Age:    63 years         BP:            82/52 mmHg Patient Gender: F                HR:           124 bpm. Exam Location:  [facility] Procedure: 2D Echo, Cardiac Doppler and Color Doppler Indications:    Atrial Fibrillation I48.91  History:        Patient has no prior history of Echocardiogram examinations.                 Arrythmias:Atrial Fibrillation; Risk Factors:Former Smoker and                 Hypertension.  Sonographer:    Greer Pickerel Referring Phys: [id number] VISHNU P MALLIPEDDI  Sonographer Comments: Suboptimal subcostal window. Image acquisition challenging due to respiratory motion and Image acquisition challenging due to uncooperative patient. IMPRESSIONS  1. Left ventricular ejection fraction, by estimation, is 55 to 60%. The left ventricle has normal function. Left ventricular endocardial border not optimally defined to evaluate regional wall motion. Left ventricular diastolic function could not be evaluated.  2. Right ventricular systolic function is normal. The right ventricular size is mildly enlarged. There is moderately elevated pulmonary artery systolic pressure. The estimated right ventricular systolic pressure is 40.7 mmHg.  3. Left atrial size was severely dilated.  4. Right atrial size was severely dilated.  5. The mitral valve is abnormal. Mild mitral valve regurgitation. No evidence of mitral stenosis.  6. Tricuspid valve regurgitation is mild to moderate.  7. The aortic valve was not well visualized. Aortic valve regurgitation is trivial. No aortic stenosis is present.  8. The inferior vena cava is normal in size with <50% respiratory variability, suggesting right atrial pressure of 8 mmHg. Comparison(s): No prior Echocardiogram. FINDINGS  Left Ventricle: Left ventricular ejection fraction, by estimation, is 55 to 60%. The left ventricle has normal function. Left ventricular endocardial border not optimally defined to evaluate regional wall motion. The left ventricular internal cavity size was normal in size. There is no left  ventricular hypertrophy. Left ventricular diastolic function could not be evaluated due to atrial fibrillation. Left ventricular diastolic function could not be evaluated. Right Ventricle: The right ventricular size is mildly enlarged. No increase in right ventricular wall thickness. Right ventricular systolic function is normal. There is moderately elevated pulmonary artery systolic pressure. The tricuspid regurgitant velocity is 3.24 m/s, and with an assumed right atrial pressure of 8 mmHg, the estimated right ventricular systolic pressure is 68.0 mmHg. Left Atrium: Left atrial size was severely dilated. Right Atrium: Right atrial size was severely dilated. Pericardium: There is no evidence of pericardial effusion. Mitral Valve: The mitral valve is abnormal. Mild to moderate mitral annular calcification. Mild mitral valve regurgitation. No evidence of mitral valve stenosis. Tricuspid Valve: The tricuspid valve is normal in structure. Tricuspid valve regurgitation is mild to moderate. No evidence of tricuspid stenosis. Aortic Valve: The aortic valve was not well visualized. Aortic valve regurgitation is trivial. No aortic stenosis is present. Pulmonic Valve: The pulmonic valve was normal in structure. Pulmonic valve regurgitation is not visualized. No evidence of pulmonic stenosis. Aorta: The aortic root and ascending aorta are structurally normal, with no evidence of dilitation. Venous: The inferior vena cava is normal in size with less than 50% respiratory variability, suggesting right atrial pressure of 8 mmHg. IAS/Shunts: The interatrial septum was not well visualized.  LEFT VENTRICLE PLAX 2D LVIDd:         3.50 cm   Diastology LVIDs:         2.40 cm   LV e' medial:    9.30 cm/s LV PW:         0.80 cm   LV E/e' medial:  13.0 LV IVS:        0.70 cm   LV e' lateral:   10.80 cm/s LVOT diam:     1.90 cm   LV E/e' lateral: 11.2 LV SV:         31 LV SV Index:   20 LVOT Area:     2.84 cm  RIGHT VENTRICLE RV S prime:      11.30 cm/s TAPSE (M-mode): 1.4 cm LEFT ATRIUM             Index        RIGHT ATRIUM  Index LA diam:        3.90 cm 2.48 cm/m   RA Area:     22.10 cm LA Vol (A2C):   75.4 ml 47.95 ml/m  RA Volume:   69.80 ml  44.39 ml/m LA Vol (A4C):   72.7 ml 46.23 ml/m LA Biplane Vol: 74.8 ml 47.57 ml/m  AORTIC VALVE LVOT Vmax:   84.90 cm/s LVOT Vmean:  59.500 cm/s LVOT VTI:    0.111 m  AORTA Ao Root diam: 3.20 cm Ao Asc diam:  3.70 cm MITRAL VALVE                TRICUSPID VALVE MV Area (PHT): 5.06 cm     TR Peak grad:   42.0 mmHg MV Decel Time: 150 msec     TR Vmax:        324.00 cm/s MV E velocity: 121.00 cm/s                             SHUNTS                             Systemic VTI:  0.11 m                             Systemic Diam: 1.90 cm Vishnu Priya Mallipeddi Electronically signed by Lorelee Cover Mallipeddi Signature Date/Time: 03/12/2022/4:22:37 PM    Final    CT Chest Wo Contrast  Result Date: 03/09/2022 CLINICAL DATA:  Right middle lobe abnormality noted on same day CT abdomen and pelvis EXAM: CT CHEST WITHOUT CONTRAST TECHNIQUE: Multidetector CT imaging of the chest was performed following the standard protocol without IV contrast. RADIATION DOSE REDUCTION: This exam was performed according to the departmental dose-optimization program which includes automated exposure control, adjustment of the mA and/or kV according to patient size and/or use of iterative reconstruction technique. COMPARISON:  CT chest dated 09/27/2021 FINDINGS: Cardiovascular: Normal heart size. No significant pericardial fluid/thickening. Coronary artery calcifications and aortic atherosclerosis. Great vessels are normal in course and caliber. Mediastinum/Nodes: Imaged thyroid gland without nodules meeting criteria for imaging follow-up by size. Normal esophagus. No pathologically enlarged axillary, supraclavicular, mediastinal, or hilar lymph nodes. Lungs/Pleura: The central airways are patent. Near-complete resolution of  previously noted left upper lobe consolidation. Interval development of multifocal new nodules and consolidation, predominantly involving the right middle lobe, where there is a gas-containing fluid collection measuring 2.2 x 1.6 cm in the base of the right middle lobe (2:87). No focal consolidation. No pneumothorax. Moderate right and trace left pleural effusions. Upper abdomen: Partially imaged bilateral renal cysts. Musculoskeletal: Diffusely heterogeneous appearance of the skeleton in keeping with known multiple myeloma. Prior vertebral augmentations of T6, T7 and T12 with additional compression deformities of T9 and T10. Similar sclerotic appearance of the left posterolateral seventh rib IMPRESSION: 1. Interval development of multifocal new nodules and consolidation, predominantly involving the right middle lobe, where there is a gas-containing fluid collection measuring 2.2 x 1.6 cm in the base of the right middle lobe. Findings are most consistent with multifocal pneumonia with abscess formation. 2. Near-complete resolution of previously noted left upper lobe consolidation. 3. Moderate right and trace left pleural effusions. 4. Diffusely heterogeneous appearance of the skeleton in keeping with known multiple myeloma. 5. Coronary artery calcifications. Aortic Atherosclerosis (ICD10-I70.0). Electronically Signed   By: Shawn Route.D.  On: 03/26/2022 11:43   CT Abdomen Pelvis W Contrast  Result Date: 03/09/2022 CLINICAL DATA:  Acute abdominal pain EXAM: CT ABDOMEN AND PELVIS WITH CONTRAST TECHNIQUE: Multidetector CT imaging of the abdomen and pelvis was performed using the standard protocol following bolus administration of intravenous contrast. RADIATION DOSE REDUCTION: This exam was performed according to the departmental dose-optimization program which includes automated exposure control, adjustment of the mA and/or kV according to patient size and/or use of iterative reconstruction technique. CONTRAST:   179m OMNIPAQUE IOHEXOL 300 MG/ML  SOLN COMPARISON:  CT scan of the chest 09/27/2021; CT scan of the abdomen and pelvis 09/22/2021 FINDINGS: Lower chest: Partially imaged ill-defined masslike density versus consolidation in the anterior inferior right middle lobe measuring at least 5.6 x 3.1 cm. Moderate layering right pleural effusion. Trace left pleural effusion. Dependent atelectasis. Cardiomegaly. No pericardial effusion. Small hiatal hernia with esophageal wall thickening. Hepatobiliary: Normal hepatic contour and morphology. No discrete hepatic lesion. The gallbladder is surgically absent. Similar degree of mild intra and extra hepatic biliary ductal dilatation. The main bile duct measures 8 mm at the pancreatic head. Pancreas: Unremarkable. No pancreatic ductal dilatation or surrounding inflammatory changes. Spleen: Normal in size without focal abnormality. Adrenals/Urinary Tract: Normal adrenal glands. No hydronephrosis, nephrolithiasis or enhancing renal mass. Simple renal cysts are noted. No imaging follow-up recommended. 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 No evidence of obstruction or focal bowel wall thickening. Normal appendix in the right lower quadrant. The terminal ileum is unremarkable. Vascular/Lymphatic: Atherosclerotic calcifications throughout the abdominal aorta. No evidence of aneurysm or dissection. No evidence of significant arterial stenosis or occlusion. No focal venous abnormality. No suspicious lymphadenopathy. Reproductive: Uterus and bilateral adnexa are unremarkable. Other: No abdominal wall hernia or abnormality. No abdominopelvic ascites. Musculoskeletal: No evidence of acute fracture or malalignment. Multiple thoracic and lumbar compression fractures are again noted without interval change. Evidence of prior cement augmentation at T12. IMPRESSION: 1. Partially imaged ill-defined masslike density versus consolidation in the  anterior inferior right middle lobe measuring 5.6 x 3.1 cm. Differential considerations include necrotic pneumonia versus primary bronchogenic carcinoma. Recommend further evaluation with CT scan of the chest. 2. Moderate layering right pleural effusion and trace left pleural effusion. 3. Cardiomegaly. 4. Small hiatal hernia with circumferential esophageal wall thickening. This may reflect GERD, or less likely esophageal neoplasm. 5. Colonic diverticular disease without CT evidence of active inflammation. 6. Scattered atherosclerotic vascular calcifications without aneurysm, dissection or significant vascular occlusion. 7. Stable appearance of multiple thoracic and lumbar compression fractures including prior cement augmentation at T12. Aortic Atherosclerosis (ICD10-I70.0). Electronically Signed   By: HJacqulynn CadetM.D.   On: 03/17/2022 11:06     Scheduled Meds:  sodium chloride   Intravenous Once   acyclovir  400 mg Oral BID   dextromethorphan-guaiFENesin  1 tablet Oral BID   levalbuterol  0.63 mg Nebulization Q6H   levothyroxine  137 mcg Oral QAC breakfast   pantoprazole  40 mg Oral Daily   sodium chloride flush  3 mL Intravenous Q12H   sodium chloride flush  3 mL Intravenous Q12H   Continuous Infusions:  sodium chloride     amiodarone 30 mg/hr (03/23/22 0955)   ceFEPime (MAXIPIME) IV Stopped (03/23/22 1022)   metronidazole Stopped (03/23/22 1054)    LOS: 1 day   CRoxan HockeyM.D on 03/23/2022 at 5:39 PM  Go to [email or web address] - for contact info  [facility] - Office  [phone] If 7PM-7AM, please contact night-coverage [email or web address]  03/23/2022, 5:39 PM

## 2022-03-24 ENCOUNTER — Inpatient Hospital Stay (HOSPITAL_COMMUNITY): Payer: Medicare Other

## 2022-03-24 ENCOUNTER — Inpatient Hospital Stay: Payer: Medicare Other

## 2022-03-24 DIAGNOSIS — J851 Abscess of lung with pneumonia: Secondary | ICD-10-CM | POA: Diagnosis not present

## 2022-03-24 DIAGNOSIS — I4891 Unspecified atrial fibrillation: Secondary | ICD-10-CM | POA: Diagnosis not present

## 2022-03-24 DIAGNOSIS — C9000 Multiple myeloma not having achieved remission: Secondary | ICD-10-CM | POA: Diagnosis not present

## 2022-03-24 DIAGNOSIS — D61818 Other pancytopenia: Secondary | ICD-10-CM

## 2022-03-24 DIAGNOSIS — J189 Pneumonia, unspecified organism: Secondary | ICD-10-CM | POA: Diagnosis not present

## 2022-03-24 LAB — CBC
HCT: 24.6 % — ABNORMAL LOW (ref 36.0–46.0)
Hemoglobin: 8 g/dL — ABNORMAL LOW (ref 12.0–15.0)
MCH: 31.7 pg (ref 26.0–34.0)
MCHC: 32.5 g/dL (ref 30.0–36.0)
MCV: 97.6 fL (ref 80.0–100.0)
Platelets: 85 10*3/uL — ABNORMAL LOW (ref 150–400)
RBC: 2.52 MIL/uL — ABNORMAL LOW (ref 3.87–5.11)
RDW: 19.4 % — ABNORMAL HIGH (ref 11.5–15.5)
WBC: 3.5 10*3/uL — ABNORMAL LOW (ref 4.0–10.5)
nRBC: 0.6 % — ABNORMAL HIGH (ref 0.0–0.2)

## 2022-03-24 LAB — CREATININE, SERUM
Creatinine, Ser: 0.85 mg/dL (ref 0.44–1.00)
GFR, Estimated: >60 mL/min (ref >60)

## 2022-03-24 LAB — HEPARIN LEVEL (UNFRACTIONATED): Heparin Unfractionated: <0.1 IU/mL — ABNORMAL LOW (ref 0.30–0.70)

## 2022-03-24 MED ORDER — PANTOPRAZOLE SODIUM 40 MG IV SOLR
40.0000 mg | Freq: Two times a day (BID) | INTRAVENOUS | Status: DC
  Administered 2022-03-24 – 2022-03-29 (×11): 40 mg via INTRAVENOUS
  Filled 2022-03-24 (×11): qty 10

## 2022-03-24 MED ORDER — HEPARIN BOLUS VIA INFUSION
1000.0000 [IU] | Freq: Once | INTRAVENOUS | Status: AC
  Administered 2022-03-24: 1000 [IU] via INTRAVENOUS
  Filled 2022-03-24: qty 1000

## 2022-03-24 MED ORDER — HEPARIN (PORCINE) 25000 UT/250ML-% IV SOLN
1000.0000 [IU]/h | INTRAVENOUS | Status: DC
  Administered 2022-03-24: 800 [IU]/h via INTRAVENOUS
  Filled 2022-03-24: qty 250

## 2022-03-24 MED ORDER — AMIODARONE HCL 200 MG PO TABS
400.0000 mg | ORAL_TABLET | Freq: Two times a day (BID) | ORAL | Status: AC
  Administered 2022-03-24 – 2022-04-01 (×18): 400 mg via ORAL
  Filled 2022-03-24 (×18): qty 2

## 2022-03-24 MED ORDER — HEPARIN BOLUS VIA INFUSION
1500.0000 [IU] | Freq: Once | INTRAVENOUS | Status: AC
  Administered 2022-03-24: 1500 [IU] via INTRAVENOUS
  Filled 2022-03-24: qty 1500

## 2022-03-24 NOTE — Progress Notes (Signed)
[facility] Progress Note  Patient: Courtney Arnold     SLH:[id number]  DOA: 03/13/2022   PCP: Derek Jack, MD       Brief hospital course: This is an 82 year old female with multiple myeloma, paroxysmal atrial fibrillation, COPD, pancytopenia requiring packed red blood cell and platelet transfusions, hypothyroidism who presented to [facility] on 1/15 with nausea vomiting and poor oral intake for the past 3 weeks. CT of chest abdomen pelvis revealed multifocal pneumonia and possible abscess in the right middle lobe with moderate right and small left pleural effusions.  Subjective:  Nausea and cough. Later notified by RN Assessment and Plan: Principal Problem:   Multifocal pneumonia with Lung Abscess and bilateral pleural effusions -CT reveals bilateral multifocal nodules with consolidation in the right middle lobe, right moderate pleural effusion and small left pleural effusion-near complete resolution of previously noted left upper lobe consolidation - pulse ox 90-97% EA today - appreciate pulm eval  -Possibly secondary to vomiting - On Metronidazole and Cefepime-no plans to bronc  -MRSA PCR negative-vancomycin discontinued - will consult ID  Active Problems: Nausea/ vomiting -Abdomen was distended on exam - She did undergo a CT scan on 1/16 which revealed a surgically absent gallbladder, CBD of 8 mm, small hiatal hernia with circumferential esophageal wall thickening suggestive of GERD or less likely esophageal neoplasm - The patient takes Decadron twice a week -Change Protonix to IV and increase to twice daily  Multiple myeloma, relapsed Fatigue related to cancer - Receiving pomalidomide as outpatient with 1/16 to have been the date of her next cycle - Has lesions throughout her skeleton - Receives acyclovir prophylaxis twice a day, Decadron and Ritalin  Paroxysmal atrial fibrillation with RVR -new diagnosis - [place] Vasc score is 4 - Placed on IV  amiodarone and transitioned to oral amiodarone today -plan for 400 mg twice daily x 10 days followed by 200 mg daily and then outpatient follow-up -Continue heparin infusion and follow for bleeding for now  Pancytopenia - Continue to follow  Hypothyroidism -TSH 5.6-likely sick euthyroid - Continue levothyroxine       Code Status: DNR Consultants: Cardiology, pulmonary Level of Care: Level of care: Progressive Total time on patient care: 40 minutes DVT prophylaxis: Heparin infusion  Objective:   Vitals:   03/24/22 0936 03/24/22 1000 03/24/22 1016 03/24/22 1227  BP: (!) 107/53   118/66  Pulse:  80    Resp: (!) 27 (!) 22  (!) 31  Temp: (!) 97.3 F (36.3 C)   97.6 F (36.4 C)  TempSrc: Oral   Oral  SpO2: 99% 92% 93% 92%  Weight:      Height:       Filed Weights   03/23/22 1225  Weight: 57.3 kg   Exam: General exam: Appears comfortable  HEENT: oral mucosa moist Respiratory system: Crackles noted bilaterally Cardiovascular system: S1 & S2 heard  Gastrointestinal system: Abdomen soft, moderately distended,. Normal bowel sounds   Extremities: No cyanosis, clubbing or edema Psychiatry:  Mood & affect appropriate.      CBC: Recent Labs  Lab 04/03/2022 0955 03/23/22 0633 03/24/22 0621  WBC 2.2* 2.4* 3.5*  NEUTROABS 1.2*  --   --   HGB 7.5* 8.4* 8.0*  HCT 23.5* 26.1* 24.6*  MCV 99.6 98.9 97.6  PLT 90* 76* 85*   Basic Metabolic Panel: Recent Labs  Lab 03/08/2022 0955 03/26/2022 1222 03/23/22 0500 03/23/22 0633 03/24/22 0621  NA 131*  --  133* 132*  --  K 4.4  --  4.0 4.0  --   CL 95*  --  102 102  --   CO2 22  --  19* 19*  --   GLUCOSE 114*  --  111* 118*  --   BUN 26*  --  21 20  --   CREATININE 0.83  --  0.82 0.85 0.85  CALCIUM 7.9*  --  6.7* 6.8*  --   MG  --  2.0  --   --   --   PHOS  --   --  2.8  --   --    GFR: Estimated Creatinine Clearance: 41.8 mL/min (by C-G formula based on SCr of 0.85 mg/dL).  Scheduled Meds:  sodium chloride    Intravenous Once   acyclovir  400 mg Oral BID   amiodarone  400 mg Oral BID   dextromethorphan-guaiFENesin  1 tablet Oral BID   levalbuterol  0.63 mg Nebulization BID   levothyroxine  137 mcg Oral QAC breakfast   pantoprazole  40 mg Oral Daily   sodium chloride flush  3 mL Intravenous Q12H   sodium chloride flush  3 mL Intravenous Q12H   Continuous Infusions:  sodium chloride     ceFEPime (MAXIPIME) IV 2 g (03/24/22 0942)   heparin 800 Units/hr (03/24/22 1319)   metronidazole 500 mg (03/24/22 1014)   Imaging and lab data was personally reviewed DG Abd 1 View  Result Date: 03/24/2022 CLINICAL DATA:  Abdominal pain, distension, vomiting EXAM: ABDOMEN - 1 VIEW COMPARISON:  CT abdomen 03/28/2022 FINDINGS: Contrast medium in the urinary bladder. Lower thoracic vertebral augmentations noted. Bony demineralization. Gas is present primarily in the colon. No dilated bowel observed. Blunting of the right costophrenic angle compatible with small right pleural effusion. Chronic L4 compression noted. Lower thoracic compressions are likewise chronic. IMPRESSION: 1. No dilated bowel observed. 2. Small right pleural effusion. 3. Bony demineralization with lower thoracic and lumbar compressions. 4. Contrast medium in the urinary bladder. Electronically Signed   By: Van Clines M.D.   On: 03/24/2022 14:56   ECHOCARDIOGRAM COMPLETE  Result Date: 03/29/2022    ECHOCARDIOGRAM REPORT   Patient Name:   KURA BETHARDS Date of Exam: 03/21/2022 Medical Rec #:  [id number]        Height:       62.0 in Accession #:    [id number]       Weight:       126.3 lb Date of Birth:  [birth year]        BSA:          1.572 m Patient Age:    73 years         BP:           82/52 mmHg Patient Gender: F                HR:           124 bpm. Exam Location:  [facility] Procedure: 2D Echo, Cardiac Doppler and Color Doppler Indications:    Atrial Fibrillation I48.91  History:        Patient has no prior history of Echocardiogram  examinations.                 Arrythmias:Atrial Fibrillation; Risk Factors:Former Smoker and                 Hypertension.  Sonographer:    Greer Pickerel Referring Phys: [id number] VISHNU P MALLIPEDDI  Sonographer Comments: Suboptimal  subcostal window. Image acquisition challenging due to respiratory motion and Image acquisition challenging due to uncooperative patient. IMPRESSIONS  1. Left ventricular ejection fraction, by estimation, is 55 to 60%. The left ventricle has normal function. Left ventricular endocardial border not optimally defined to evaluate regional wall motion. Left ventricular diastolic function could not be evaluated.  2. Right ventricular systolic function is normal. The right ventricular size is mildly enlarged. There is moderately elevated pulmonary artery systolic pressure. The estimated right ventricular systolic pressure is 23.7 mmHg.  3. Left atrial size was severely dilated.  4. Right atrial size was severely dilated.  5. The mitral valve is abnormal. Mild mitral valve regurgitation. No evidence of mitral stenosis.  6. Tricuspid valve regurgitation is mild to moderate.  7. The aortic valve was not well visualized. Aortic valve regurgitation is trivial. No aortic stenosis is present.  8. The inferior vena cava is normal in size with <50% respiratory variability, suggesting right atrial pressure of 8 mmHg. Comparison(s): No prior Echocardiogram. FINDINGS  Left Ventricle: Left ventricular ejection fraction, by estimation, is 55 to 60%. The left ventricle has normal function. Left ventricular endocardial border not optimally defined to evaluate regional wall motion. The left ventricular internal cavity size was normal in size. There is no left ventricular hypertrophy. Left ventricular diastolic function could not be evaluated due to atrial fibrillation. Left ventricular diastolic function could not be evaluated. Right Ventricle: The right ventricular size is mildly enlarged. No increase in right  ventricular wall thickness. Right ventricular systolic function is normal. There is moderately elevated pulmonary artery systolic pressure. The tricuspid regurgitant velocity is 3.24 m/s, and with an assumed right atrial pressure of 8 mmHg, the estimated right ventricular systolic pressure is 62.8 mmHg. Left Atrium: Left atrial size was severely dilated. Right Atrium: Right atrial size was severely dilated. Pericardium: There is no evidence of pericardial effusion. Mitral Valve: The mitral valve is abnormal. Mild to moderate mitral annular calcification. Mild mitral valve regurgitation. No evidence of mitral valve stenosis. Tricuspid Valve: The tricuspid valve is normal in structure. Tricuspid valve regurgitation is mild to moderate. No evidence of tricuspid stenosis. Aortic Valve: The aortic valve was not well visualized. Aortic valve regurgitation is trivial. No aortic stenosis is present. Pulmonic Valve: The pulmonic valve was normal in structure. Pulmonic valve regurgitation is not visualized. No evidence of pulmonic stenosis. Aorta: The aortic root and ascending aorta are structurally normal, with no evidence of dilitation. Venous: The inferior vena cava is normal in size with less than 50% respiratory variability, suggesting right atrial pressure of 8 mmHg. IAS/Shunts: The interatrial septum was not well visualized.  LEFT VENTRICLE PLAX 2D LVIDd:         3.50 cm   Diastology LVIDs:         2.40 cm   LV e' medial:    9.30 cm/s LV PW:         0.80 cm   LV E/e' medial:  13.0 LV IVS:        0.70 cm   LV e' lateral:   10.80 cm/s LVOT diam:     1.90 cm   LV E/e' lateral: 11.2 LV SV:         31 LV SV Index:   20 LVOT Area:     2.84 cm  RIGHT VENTRICLE RV S prime:     11.30 cm/s TAPSE (M-mode): 1.4 cm LEFT ATRIUM             Index  RIGHT ATRIUM           Index LA diam:        3.90 cm 2.48 cm/m   RA Area:     22.10 cm LA Vol (A2C):   75.4 ml 47.95 ml/m  RA Volume:   69.80 ml  44.39 ml/m LA Vol (A4C):   72.7  ml 46.23 ml/m LA Biplane Vol: 74.8 ml 47.57 ml/m  AORTIC VALVE LVOT Vmax:   84.90 cm/s LVOT Vmean:  59.500 cm/s LVOT VTI:    0.111 m  AORTA Ao Root diam: 3.20 cm Ao Asc diam:  3.70 cm MITRAL VALVE                TRICUSPID VALVE MV Area (PHT): 5.06 cm     TR Peak grad:   42.0 mmHg MV Decel Time: 150 msec     TR Vmax:        324.00 cm/s MV E velocity: 121.00 cm/s                             SHUNTS                             Systemic VTI:  0.11 m                             Systemic Diam: 1.90 cm Vishnu Priya Mallipeddi Electronically signed by Lorelee Cover Mallipeddi Signature Date/Time: 03/10/2022/4:22:37 PM    Final     LOS: 2 days   Author: Debbe Odea  03/24/2022 3:27 PM  To contact [facility]>   Check the care team in [facility] and look for the attending/consulting [place] provider listed  Log into [email or web address] and use [place]'s universal password   Go to> "[facility]"  and find provider  If you still have difficulty reaching the provider, please page the [facility] (Director on Call) for the Hospitalists listed on amion

## 2022-03-24 NOTE — Progress Notes (Addendum)
ANTICOAGULATION CONSULT NOTE - Initial Consult  Pharmacy Consult for heparin Indication: atrial fibrillation  No Known Allergies  Patient Measurements: Height: '5\' 2"'$  (157.5 cm) Weight: 57.3 kg (126 lb 5.2 oz) IBW/kg (Calculated) : 50.1 Heparin Dosing Weight: TBW  Vital Signs: Temp: 97.6 F (36.4 C) (01/17 0809) Temp Source: Oral (01/17 0809) BP: 111/85 (01/17 0809) Pulse Rate: 86 (01/17 0809)  Labs: Recent Labs    03/20/2022 0955 03/15/2022 1222 03/23/22 0500 03/23/22 0633 03/24/22 0621  HGB 7.5*  --   --  8.4*  --   HCT 23.5*  --   --  26.1*  --   PLT 90*  --   --  76*  --   CREATININE 0.83  --  0.82 0.85 0.85  TROPONINIHS 10 10  --   --   --     Estimated Creatinine Clearance: 41.8 mL/min (by C-G formula based on SCr of 0.85 mg/dL).   Medical History: Past Medical History:  Diagnosis Date   Atrial fibrillation ([place]) 2011   Postop, spontaneous conversion to normal sinus after one hour   CAP (community acquired pneumonia) 09/23/2021   Cholecystitis, acute 04/25/2015   Compression fracture 07/24/2009   T12; kyphoplasty   History of echocardiogram 07/2009   EF 65%   Hypertension    Thyroid disease    Tobacco abuse     Medications:  Scheduled:   sodium chloride   Intravenous Once   acyclovir  400 mg Oral BID   amiodarone  400 mg Oral BID   dextromethorphan-guaiFENesin  1 tablet Oral BID   levalbuterol  0.63 mg Nebulization BID   levothyroxine  137 mcg Oral QAC breakfast   pantoprazole  40 mg Oral Daily   sodium chloride flush  3 mL Intravenous Q12H   sodium chloride flush  3 mL Intravenous Q12H    Assessment: 82 yo female with new onset Afib w/ RVR. PMH includes multiple myeloma on oral chemo, chronic anemia/pancytopenia requiring transfusions. CHADs-VASc = 4. No anticoag PTA. Ordered to start Lovenox '1mg'$ /kg on 1/18 but was discontinued for hgb 7.5 (transfused) - no doses given. Cardiology following. Keep AC unless platelets drop below 50 per cardiology  rec's. Pharmacy consulted to dose IV heparin for atrial fibrillation. Plan to transition to Eliquis if platelets remains stable on IV heparin.   Hgb 8.4 (baseline appears to be around 8-10), platelets 76 (chronically low). No bleeding noted.  Goal of Therapy:  Heparin level 0.3-0.7 units/ml Monitor platelets by anticoagulation protocol: Yes   Plan:  Small heparin bolus of 1000 units IV x1 (reduced since low platelets)  Start heparin infusion at 800 units/hr Check heparin level in 8 hours Daily CBC, HL Monitor platelets closely and ability to transition to [place], PharmD, BCPS 03/24/2022 9:39 AM

## 2022-03-24 NOTE — Progress Notes (Signed)
ANTICOAGULATION CONSULT NOTE  Pharmacy Consult for heparin Indication: atrial fibrillation  No Known Allergies  Patient Measurements: Height: '5\' 2"'$  (157.5 cm) Weight: 57.3 kg (126 lb 5.2 oz) IBW/kg (Calculated) : 50.1 Heparin Dosing Weight: TBW  Vital Signs: Temp: 98 F (36.7 C) (01/17 1927) Temp Source: Oral (01/17 1927) BP: 110/67 (01/17 1927) Pulse Rate: 114 (01/17 1927)  Labs: Recent Labs    04/07/2022 0955 03/14/2022 1222 03/23/22 0500 03/23/22 0633 03/24/22 0621 03/24/22 2041  HGB 7.5*  --   --  8.4* 8.0*  --   HCT 23.5*  --   --  26.1* 24.6*  --   PLT 90*  --   --  76* 85*  --   HEPARINUNFRC  --   --   --   --   --  <0.10*  CREATININE 0.83  --  0.82 0.85 0.85  --   TROPONINIHS 10 10  --   --   --   --      Estimated Creatinine Clearance: 41.8 mL/min (by C-G formula based on SCr of 0.85 mg/dL).   Assessment: 82 yo female with new onset Afib w/ RVR. PMH includes multiple myeloma on oral chemo, chronic anemia/pancytopenia requiring transfusions. CHADs-VASc = 4. No anticoag PTA. Ordered to start Lovenox '1mg'$ /kg on 1/18 but was discontinued for hgb 7.5 (transfused) - no doses given. Cardiology following. Keep AC unless platelets drop below 50 per cardiology rec's. Pharmacy consulted to dose IV heparin for atrial fibrillation. Plan to transition to Eliquis if platelets remains stable on IV heparin.  Hgb 8.4 (baseline appears to be around 8-10), platelets 76 (chronically low).   Heparin level undetectable on infusion at 800 units/hr. No issues with line or bleeding reported per RN.  Goal of Therapy:  Heparin level 0.3-0.7 units/ml Monitor platelets by anticoagulation protocol: Yes   Plan:  Rebolus heparin 1500 units Increase heparin infusion to 1000 units/hr Check heparin level in 8 hours  Sherlon Handing, PharmD, BCPS Please see amion for complete clinical pharmacist phone list 03/24/2022 9:21 PM

## 2022-03-24 NOTE — Progress Notes (Signed)
Pt converted to afib at 1814. Pt had been sleeping when visitor came. Paged MD and made aware. Will give PM dose of amiodarone now. Pt resting with call bell within reach.  Will continue to monitor. Payton Emerald, RN

## 2022-03-24 NOTE — Progress Notes (Signed)
Palliative Care Progress Note, Assessment & Plan   Patient Name: Courtney Arnold       Date: 03/24/2022 DOB: [birth year]  Age: 82 y.o. MRN#: [id number] Attending Physician: Debbe Odea, MD Primary Care Physician: Derek Jack, MD Admit Date: 03/21/2022  Subjective: Patient is sitting up in bed in mild distress.  She has just vomited small amounts of thin, yellow to clear liquids.  She complains of abdominal pain that is 5 out of 10.  Her daughter and daughter's husband are at bedside.  HPI: 82 y.o. female  with past medical history of multiple myeloma not in remission, active with Dr. Delton Coombes, significant pancytopenia with anemia and thrombocytopenia, COPD, A-fib postop with spontaneous conversion, HTN, thyroid disease, compression fracture T12 with kyphoplasty 2011, right femur IM nailing 2021, admitted on 03/24/2022 with lung abscess multi focal pneumonia with abscess formation.   Summary of counseling/coordination of care: After reviewing the patient's chart and assessing the patient at bedside, I assessed patient's pain and discomfort.  Her abdomen is distended with decreased bowel sounds in all 4 quadrants.  During my encounter, Dr. Halford Chessman spoke with family in regards to bronchoscopy. Plan is for brocnh to be avoided at this time and continue watchful waiting for patient to clear secretions and improve without bronch intervention at this time.   Additionally, RN spoke with attending who has ordered abdominal x-ray.  I discussed possible diagnosis r/t abdominal distention - potential for SBO, ileus, constipation. Reviewed potential for use of NG tube placement for decompression, bowel regimen to relieve discomfort, or other diagnostics/procedures to determine source of abdominal pain/nausea.  Daughters shares that patient has a deviated septum and may bleed if NG tube is placed but is agreeable to it if it relieves patient of discomfort.   Therapeutic silence and active listening provided for patient, her daughter, and daughter's husband to share their thoughts and emotions regarding patient's current health situation.  Daughter is concerned that patient has multiple comorbidities contributing to patient's overall current medical status.  She shares she has discussed with her sister that patient's A-fib, weakened heart, and now GI issues on top of patient's ongoing oncology treatments make it difficult to specifically "fix" anyone of the patient's ongoing medical issues.  We reviewed patient's advanced age and comorbidities as contributors to her overall well being. We discussed treating the treatable and relieving patient of pain and suffering during these treatments.   As per my colleagues discussion with family earlier, patient has not been consistently clear with her goals of care or boundaries/limitations of care.  Given acute abdominal pain, atient is not in a state to discuss goals of care at this time given. Family was in agreement for PMT to continue to follow.    PMT will continue to discuss goals of care throughout patient's hospitalization.  Physical Exam Vitals reviewed.  Constitutional:      General: She is in acute distress.     Appearance: She is not ill-appearing.  HENT:     Head: Normocephalic.  Cardiovascular:     Rate and Rhythm: Normal rate. Rhythm irregular.  Pulmonary:     Effort: Pulmonary effort is normal.  Abdominal:     General: Bowel sounds  are decreased. There is distension.     Tenderness: There is generalized abdominal tenderness. There is no rebound.  Skin:    General: Skin is warm and dry.  Neurological:     Mental Status: She is alert and oriented to person, place, and time.  Psychiatric:        Mood and Affect: Mood normal. Mood is not  anxious.        Behavior: Behavior normal.             Palliative Assessment/Data: 50%    Total Time 35 minutes   Thank you for allowing the Palliative Medicine Team to assist in the care of this patient.  [facility] Ilsa Iha, FNP-BC Palliative Medicine Team Team Phone # [phone]

## 2022-03-24 NOTE — Progress Notes (Signed)
Rounding Note    Patient Name: Courtney Arnold Date of Encounter: 03/24/2022  [facility] Cardiologist: New  Subjective   Converted to sinus rhythm.  She is here with multifocal PNA. Has multiple myeloma  ANC was 0.9 now improved Hgb was stable 7-8 Plts stable 76  was < 50 1 week ago   Inpatient Medications    Scheduled Meds:  sodium chloride   Intravenous Once   acyclovir  400 mg Oral BID   amiodarone  400 mg Oral BID   dextromethorphan-guaiFENesin  1 tablet Oral BID   levalbuterol  0.63 mg Nebulization BID   levothyroxine  137 mcg Oral QAC breakfast   pantoprazole  40 mg Oral Daily   sodium chloride flush  3 mL Intravenous Q12H   sodium chloride flush  3 mL Intravenous Q12H   Continuous Infusions:  sodium chloride     ceFEPime (MAXIPIME) IV 2 g (03/24/22 0114)   metronidazole 500 mg (03/23/22 2342)   PRN Meds: sodium chloride, acetaminophen **OR** acetaminophen, albuterol, bisacodyl, ondansetron **OR** ondansetron (ZOFRAN) IV, polyethylene glycol, polyvinyl alcohol, sodium chloride flush, traMADol, traZODone   Vital Signs    Vitals:   03/24/22 0045 03/24/22 0341 03/24/22 0753 03/24/22 0809  BP: (!) 108/55 (!) 102/52  111/85  Pulse: 84 83 75 86  Resp: (!) 25 20 (!) 22 (!) 26  Temp:  97.9 F (36.6 C)  97.6 F (36.4 C)  TempSrc:  Oral  Oral  SpO2: 97% 98% 100% 96%  Weight:      Height:        Intake/Output Summary (Last 24 hours) at 03/24/2022 0936 Last data filed at 03/24/2022 0500 Gross per 24 hour  Intake 1214.51 ml  Output --  Net 1214.51 ml      03/23/2022   12:25 PM 03/17/2022    1:34 PM 03/04/2022   11:10 AM  Last 3 Weights  Weight (lbs) 126 lb 5.2 oz 126 lb 4.8 oz 133 lb 3.2 oz  Weight (kg) 57.3 kg 57.289 kg 60.419 kg      Telemetry    Sinus arrhythmia - Personally Reviewed  ECG    No new - Personally Reviewed  Physical Exam   GEN: mild wob Neck: No JVD Cardiac:RRR, no murmurs, rubs, or gallops.  Respiratory:  diminished sounds  BL GI: Soft, nontender, non-distended  MS: No edema; No deformity. Neuro:  Nonfocal  Psych: Normal affect   Labs    High Sensitivity Troponin:   Recent Labs  Lab 03/29/2022 0955 03/31/2022 1222  TROPONINIHS 10 10     Chemistry Recent Labs  Lab 03/17/22 1311 03/10/2022 0955 03/21/2022 1222 03/23/22 0500 03/23/22 0633 03/24/22 0621  NA 135 131*  --  133* 132*  --   K 4.7 4.4  --  4.0 4.0  --   CL 101 95*  --  102 102  --   CO2 23 22  --  19* 19*  --   GLUCOSE 162* 114*  --  111* 118*  --   BUN 30* 26*  --  21 20  --   CREATININE 1.21* 0.83  --  0.82 0.85 0.85  CALCIUM 8.0* 7.9*  --  6.7* 6.8*  --   MG 2.0  --  2.0  --   --   --   PROT 6.4* 5.9*  --   --  5.4*  --   ALBUMIN 3.1* 2.7*  --  2.4* 2.4*  --   AST 31  15  --   --  10*  --   ALT 18 18  --   --  15  --   ALKPHOS 78 79  --   --  64  --   BILITOT 0.6 1.0  --   --  0.8  --   GFRNONAA 45* >60  --  >60 >60 >60  ANIONGAP 11 14  --  12 11  --     Lipids No results for input(s): "CHOL", "TRIG", "HDL", "LABVLDL", "LDLCALC", "CHOLHDL" in the last 168 hours.  Hematology Recent Labs  Lab 03/17/22 1311 03/19/2022 0955 03/23/22 0633  WBC 1.4* 2.2* 2.4*  RBC 2.68* 2.36* 2.64*  HGB 8.4* 7.5* 8.4*  HCT 26.5* 23.5* 26.1*  MCV 98.9 99.6 98.9  MCH 31.3 31.8 31.8  MCHC 31.7 31.9 32.2  RDW 17.9* 19.9* 19.2*  PLT 44* 90* 76*   Thyroid  Recent Labs  Lab 03/10/2022 0955  TSH 5.614*    BNPNo results for input(s): "BNP", "PROBNP" in the last 168 hours.  DDimer No results for input(s): "DDIMER" in the last 168 hours.   Radiology    ECHOCARDIOGRAM COMPLETE  Result Date: 04/03/2022    ECHOCARDIOGRAM REPORT   Patient Name:   Courtney Arnold Date of Exam: 03/29/2022 Medical Rec #:  [id number]        Height:       62.0 in Accession #:    [id number]       Weight:       126.3 lb Date of Birth:  May 04, 1940        BSA:          1.572 m Patient Age:    82 years         BP:           82/52 mmHg Patient Gender: F                 HR:           124 bpm. Exam Location:  [facility] Procedure: 2D Echo, Cardiac Doppler and Color Doppler Indications:    Atrial Fibrillation I48.91  History:        Patient has no prior history of Echocardiogram examinations.                 Arrythmias:Atrial Fibrillation; Risk Factors:Former Smoker and                 Hypertension.  Sonographer:    Greer Pickerel Referring Phys: [id number] VISHNU P MALLIPEDDI  Sonographer Comments: Suboptimal subcostal window. Image acquisition challenging due to respiratory motion and Image acquisition challenging due to uncooperative patient. IMPRESSIONS  1. Left ventricular ejection fraction, by estimation, is 55 to 60%. The left ventricle has normal function. Left ventricular endocardial border not optimally defined to evaluate regional wall motion. Left ventricular diastolic function could not be evaluated.  2. Right ventricular systolic function is normal. The right ventricular size is mildly enlarged. There is moderately elevated pulmonary artery systolic pressure. The estimated right ventricular systolic pressure is 66.0 mmHg.  3. Left atrial size was severely dilated.  4. Right atrial size was severely dilated.  5. The mitral valve is abnormal. Mild mitral valve regurgitation. No evidence of mitral stenosis.  6. Tricuspid valve regurgitation is mild to moderate.  7. The aortic valve was not well visualized. Aortic valve regurgitation is trivial. No aortic stenosis is present.  8. The inferior vena cava is normal in size with <50% respiratory  variability, suggesting right atrial pressure of 8 mmHg. Comparison(s): No prior Echocardiogram. FINDINGS  Left Ventricle: Left ventricular ejection fraction, by estimation, is 55 to 60%. The left ventricle has normal function. Left ventricular endocardial border not optimally defined to evaluate regional wall motion. The left ventricular internal cavity size was normal in size. There is no left ventricular hypertrophy. Left ventricular  diastolic function could not be evaluated due to atrial fibrillation. Left ventricular diastolic function could not be evaluated. Right Ventricle: The right ventricular size is mildly enlarged. No increase in right ventricular wall thickness. Right ventricular systolic function is normal. There is moderately elevated pulmonary artery systolic pressure. The tricuspid regurgitant velocity is 3.24 m/s, and with an assumed right atrial pressure of 8 mmHg, the estimated right ventricular systolic pressure is 87.5 mmHg. Left Atrium: Left atrial size was severely dilated. Right Atrium: Right atrial size was severely dilated. Pericardium: There is no evidence of pericardial effusion. Mitral Valve: The mitral valve is abnormal. Mild to moderate mitral annular calcification. Mild mitral valve regurgitation. No evidence of mitral valve stenosis. Tricuspid Valve: The tricuspid valve is normal in structure. Tricuspid valve regurgitation is mild to moderate. No evidence of tricuspid stenosis. Aortic Valve: The aortic valve was not well visualized. Aortic valve regurgitation is trivial. No aortic stenosis is present. Pulmonic Valve: The pulmonic valve was normal in structure. Pulmonic valve regurgitation is not visualized. No evidence of pulmonic stenosis. Aorta: The aortic root and ascending aorta are structurally normal, with no evidence of dilitation. Venous: The inferior vena cava is normal in size with less than 50% respiratory variability, suggesting right atrial pressure of 8 mmHg. IAS/Shunts: The interatrial septum was not well visualized.  LEFT VENTRICLE PLAX 2D LVIDd:         3.50 cm   Diastology LVIDs:         2.40 cm   LV e' medial:    9.30 cm/s LV PW:         0.80 cm   LV E/e' medial:  13.0 LV IVS:        0.70 cm   LV e' lateral:   10.80 cm/s LVOT diam:     1.90 cm   LV E/e' lateral: 11.2 LV SV:         31 LV SV Index:   20 LVOT Area:     2.84 cm  RIGHT VENTRICLE RV S prime:     11.30 cm/s TAPSE (M-mode): 1.4 cm LEFT  ATRIUM             Index        RIGHT ATRIUM           Index LA diam:        3.90 cm 2.48 cm/m   RA Area:     22.10 cm LA Vol (A2C):   75.4 ml 47.95 ml/m  RA Volume:   69.80 ml  44.39 ml/m LA Vol (A4C):   72.7 ml 46.23 ml/m LA Biplane Vol: 74.8 ml 47.57 ml/m  AORTIC VALVE LVOT Vmax:   84.90 cm/s LVOT Vmean:  59.500 cm/s LVOT VTI:    0.111 m  AORTA Ao Root diam: 3.20 cm Ao Asc diam:  3.70 cm MITRAL VALVE                TRICUSPID VALVE MV Area (PHT): 5.06 cm     TR Peak grad:   42.0 mmHg MV Decel Time: 150 msec     TR Vmax:  324.00 cm/s MV E velocity: 121.00 cm/s                             SHUNTS                             Systemic VTI:  0.11 m                             Systemic Diam: 1.90 cm Vishnu Priya Mallipeddi Electronically signed by Lorelee Cover Mallipeddi Signature Date/Time: 03/15/2022/4:22:37 PM    Final    CT Chest Wo Contrast  Result Date: 03/09/2022 CLINICAL DATA:  Right middle lobe abnormality noted on same day CT abdomen and pelvis EXAM: CT CHEST WITHOUT CONTRAST TECHNIQUE: Multidetector CT imaging of the chest was performed following the standard protocol without IV contrast. RADIATION DOSE REDUCTION: This exam was performed according to the departmental dose-optimization program which includes automated exposure control, adjustment of the mA and/or kV according to patient size and/or use of iterative reconstruction technique. COMPARISON:  CT chest dated 09/27/2021 FINDINGS: Cardiovascular: Normal heart size. No significant pericardial fluid/thickening. Coronary artery calcifications and aortic atherosclerosis. Great vessels are normal in course and caliber. Mediastinum/Nodes: Imaged thyroid gland without nodules meeting criteria for imaging follow-up by size. Normal esophagus. No pathologically enlarged axillary, supraclavicular, mediastinal, or hilar lymph nodes. Lungs/Pleura: The central airways are patent. Near-complete resolution of previously noted left upper lobe  consolidation. Interval development of multifocal new nodules and consolidation, predominantly involving the right middle lobe, where there is a gas-containing fluid collection measuring 2.2 x 1.6 cm in the base of the right middle lobe (2:87). No focal consolidation. No pneumothorax. Moderate right and trace left pleural effusions. Upper abdomen: Partially imaged bilateral renal cysts. Musculoskeletal: Diffusely heterogeneous appearance of the skeleton in keeping with known multiple myeloma. Prior vertebral augmentations of T6, T7 and T12 with additional compression deformities of T9 and T10. Similar sclerotic appearance of the left posterolateral seventh rib IMPRESSION: 1. Interval development of multifocal new nodules and consolidation, predominantly involving the right middle lobe, where there is a gas-containing fluid collection measuring 2.2 x 1.6 cm in the base of the right middle lobe. Findings are most consistent with multifocal pneumonia with abscess formation. 2. Near-complete resolution of previously noted left upper lobe consolidation. 3. Moderate right and trace left pleural effusions. 4. Diffusely heterogeneous appearance of the skeleton in keeping with known multiple myeloma. 5. Coronary artery calcifications. Aortic Atherosclerosis (ICD10-I70.0). Electronically Signed   By: Darrin Nipper M.D.   On: 04/03/2022 11:43   CT Abdomen Pelvis W Contrast  Result Date: 04/02/2022 CLINICAL DATA:  Acute abdominal pain EXAM: CT ABDOMEN AND PELVIS WITH CONTRAST TECHNIQUE: Multidetector CT imaging of the abdomen and pelvis was performed using the standard protocol following bolus administration of intravenous contrast. RADIATION DOSE REDUCTION: This exam was performed according to the departmental dose-optimization program which includes automated exposure control, adjustment of the mA and/or kV according to patient size and/or use of iterative reconstruction technique. CONTRAST:  152m OMNIPAQUE IOHEXOL 300 MG/ML   SOLN COMPARISON:  CT scan of the chest 09/27/2021; CT scan of the abdomen and pelvis 09/22/2021 FINDINGS: Lower chest: Partially imaged ill-defined masslike density versus consolidation in the anterior inferior right middle lobe measuring at least 5.6 x 3.1 cm. Moderate layering right pleural effusion. Trace left pleural effusion. Dependent atelectasis. Cardiomegaly.  No pericardial effusion. Small hiatal hernia with esophageal wall thickening. Hepatobiliary: Normal hepatic contour and morphology. No discrete hepatic lesion. The gallbladder is surgically absent. Similar degree of mild intra and extra hepatic biliary ductal dilatation. The main bile duct measures 8 mm at the pancreatic head. Pancreas: Unremarkable. No pancreatic ductal dilatation or surrounding inflammatory changes. Spleen: Normal in size without focal abnormality. Adrenals/Urinary Tract: Normal adrenal glands. No hydronephrosis, nephrolithiasis or enhancing renal mass. Simple renal cysts are noted. No imaging follow-up recommended. 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 No evidence of obstruction or focal bowel wall thickening. Normal appendix in the right lower quadrant. The terminal ileum is unremarkable. Vascular/Lymphatic: Atherosclerotic calcifications throughout the abdominal aorta. No evidence of aneurysm or dissection. No evidence of significant arterial stenosis or occlusion. No focal venous abnormality. No suspicious lymphadenopathy. Reproductive: Uterus and bilateral adnexa are unremarkable. Other: No abdominal wall hernia or abnormality. No abdominopelvic ascites. Musculoskeletal: No evidence of acute fracture or malalignment. Multiple thoracic and lumbar compression fractures are again noted without interval change. Evidence of prior cement augmentation at T12. IMPRESSION: 1. Partially imaged ill-defined masslike density versus consolidation in the anterior inferior right middle lobe  measuring 5.6 x 3.1 cm. Differential considerations include necrotic pneumonia versus primary bronchogenic carcinoma. Recommend further evaluation with CT scan of the chest. 2. Moderate layering right pleural effusion and trace left pleural effusion. 3. Cardiomegaly. 4. Small hiatal hernia with circumferential esophageal wall thickening. This may reflect GERD, or less likely esophageal neoplasm. 5. Colonic diverticular disease without CT evidence of active inflammation. 6. Scattered atherosclerotic vascular calcifications without aneurysm, dissection or significant vascular occlusion. 7. Stable appearance of multiple thoracic and lumbar compression fractures including prior cement augmentation at T12. Aortic Atherosclerosis (ICD10-I70.0). Electronically Signed   By: Jacqulynn Cadet M.D.   On: 03/14/2022 11:06    Cardiac Studies   Echo 03/22/21 1. Left ventricular ejection fraction, by estimation, is 55 to 60%. The  left ventricle has normal function. Left ventricular endocardial border  not optimally defined to evaluate regional wall motion. Left ventricular  diastolic function could not be  evaluated.   2. Right ventricular systolic function is normal. The right ventricular  size is mildly enlarged. There is moderately elevated pulmonary artery  systolic pressure. The estimated right ventricular systolic pressure is  76.5 mmHg.   3. Left atrial size was severely dilated.   4. Right atrial size was severely dilated.   5. The mitral valve is abnormal. Mild mitral valve regurgitation. No  evidence of mitral stenosis.   6. Tricuspid valve regurgitation is mild to moderate.   7. The aortic valve was not well visualized. Aortic valve regurgitation  is trivial. No aortic stenosis is present.   8. The inferior vena cava is normal in size with <50% respiratory  variability, suggesting right atrial pressure of 8 mmHg.   Comparison(s): No prior Echocardiogram.   CHA2DS2-VASc Score = 4   This  indicates a 4.8% annual risk of stroke. The patient's score is based upon: CHF History: 0 HTN History: 1 Diabetes History: 0 Stroke History: 0 Vascular Disease History: 0 Age Score: 2 Gender Score: 1        Patient Profile     82 y.o. female with h/o HTN, hypothyroidism who is being seen today for the evaluation of A-fib with RVR   Assessment & Plan    New onset Afib RVR -New onset A-fib with RVR in the setting of pneumonia complicated by lung abscess, multiple  myeloma/chronic anemia, acute respiratory failure. Managed with IV dilt and then IV amio without resolve. TSH 5.6. May be euthyroid. Will need to follow on amio. EF is normal LA is severely dilated. At risk for recurrence - converted to sinus rhythm, 1/17 - change IV amio to oral amiodarone 400 mg BID for 10 days, amio 200 mg daily - she has multiple myeloma and plts have been low, ok to continue [facility] unless plts drop below 50. They have been stable - start heparin gtt ; can transition to eliquis if plts are stable. She has lab FU ~ q3 weeks per patient. Oncology can follow  PNA complicated by Lung abscess -antibiotics per primary team  Hypertension -Medications held for low blood pressure  Multiple myeloma Pancytopenia with anemia and thrombocytopenia -Secondary to multiple myeloma quiring transfusions PRBCs and platelets every few weeks - Hgb down to 7.5 given PRBCs  Goals of care -Patient is DNR -Plan to get palliative on board  For questions or updates, please contact [place] Please consult [email or web address] for contact info under        Signed, Janina Mayo, MD  03/24/2022, 9:36 AM

## 2022-03-24 NOTE — Progress Notes (Signed)
NAME:  Courtney Arnold, MRN:  [id number], DOB:  October 25, 1940, LOS: 2 ADMISSION DATE:  03/29/2022, CONSULTATION DATE:  03/22/21 REFERRING MD:  Denton Brick  CHIEF COMPLAINT:  abd pain N and V while on chemo for refractory MM and now with R ML necrotizing pna   History of Present Illness:  82  yowf quit smoking 8 y PTA with  relapsed refractory MM and HBP ? Prior afib  admitted with 3 weeks of gen abd pain N and V with multifocal pna most dense in RML with cavity formation and PCCM service consulted pm 1/15   Pertinent  Medical History  82 y.o. female with multiple myeloma, atrial fibrillation, hypertension and history of cholecystectomy presenting for abdominal pain.  Abdominal pain started about 2 weeks PTA  States she is tender all over.  She has had 3 weeks of nausea and vomiting.  States her appetite has been really poor she is only drinking [facility] but cannot keep it down.  Denies fever or chills.  Denies diarrhea but still has been loose.  Denies hematemesis, hematochezia or melena and hematuria.  Daughter believes it to be related to new medication for her multiple myeloma which she started 6 months ago.  Medication is Pomalyst.  Daughter also mentioned that her skin appears more "yellow".  Taking weekly steroids.  Daughter stated   not currently being treated for atrial fibrillation.   Significant Hospital Events: Including procedures, antibiotic start and stop dates in addition to other pertinent events   MRSA  PCR  1/15 neg  BC x 2  1/15   Interim History / Subjective:  No acute distress  Objective   Blood pressure (!) 107/53, pulse 80, temperature (!) 97.3 F (36.3 C), temperature source Oral, resp. rate (!) 22, height '5\' 2"'$  (1.575 m), weight 57.3 kg, SpO2 93 %.    FiO2 (%):  [32 %] 32 %   Intake/Output Summary (Last 24 hours) at 03/24/2022 1055 Last data filed at 03/24/2022 0500 Gross per 24 hour  Intake 1214.51 ml  Output --  Net 1214.51 ml   Filed Weights   03/23/22 1225   Weight: 57.3 kg    Examination: General: Frail elderly female in no acute distress at rest HEENT: MM pink/moist no JVD or lymphadenopathy is appreciated Neuro: Hard of hearing dull affect CV: Heart rate is 81 and regular PULM: Currently on 3 L with sats of 93% mild rhonchi  GI: soft, bsx4 active  GU: Voids Extremities: warm/dry, 1+ edema  Skin: no rashes or lesions       Assessment & Plan:  1) compromised host on Chemo for relapsed refractory  MM with Multifocal pna with one area suggesting a necrotizing process in the RML ? Lung abscess vs opportunistic infection with necrosis eg nocardia or aspegillus or even mucormycosis  Currently on acyclovir, cefepime, Flagyl vancomycin. Question residual need fiberoptic bronchoscopy for diagnostic purposes and tube antibiotic. She is elderly and frail may not do well with invasive procedures.   2) Rapid afib with borderly bp's suggesting possible sepsis She has been transferred to [facility] for possible fiberoptic bronchoscopy She has a history of multiple myeloma on chemotherapy currently.  She is followed by oncology and they may need to be involved at this time.   3) abd pain with peritonial signs with neg CT abd ?Reaction to chemo, continue to monitor  Labs   CBC: Recent Labs  Lab 03/17/22 1311 03/28/2022 0955 03/23/22 0633  WBC 1.4* 2.2* 2.4*  NEUTROABS  --  1.2*  --   HGB 8.4* 7.5* 8.4*  HCT 26.5* 23.5* 26.1*  MCV 98.9 99.6 98.9  PLT 44* 90* 76*    Basic Metabolic Panel: Recent Labs  Lab 03/17/22 1311 03/29/2022 0955 03/27/2022 1222 03/23/22 0500 03/23/22 0633 03/24/22 0621  NA 135 131*  --  133* 132*  --   K 4.7 4.4  --  4.0 4.0  --   CL 101 95*  --  102 102  --   CO2 23 22  --  19* 19*  --   GLUCOSE 162* 114*  --  111* 118*  --   BUN 30* 26*  --  21 20  --   CREATININE 1.21* 0.83  --  0.82 0.85 0.85  CALCIUM 8.0* 7.9*  --  6.7* 6.8*  --   MG 2.0  --  2.0  --   --   --   PHOS  --   --   --  2.8  --    --    GFR: Estimated Creatinine Clearance: 41.8 mL/min (by C-G formula based on SCr of 0.85 mg/dL). Recent Labs  Lab 03/17/22 1311 03/09/2022 0955 03/26/2022 1035 03/15/2022 1222 03/23/22 0633  WBC 1.4* 2.2*  --   --  2.4*  LATICACIDVEN  --   --  1.9 1.5  --     Liver Function Tests: Recent Labs  Lab 03/17/22 1311 03/13/2022 0955 03/23/22 0500 03/23/22 0633  AST 31 15  --  10*  ALT 18 18  --  15  ALKPHOS 78 79  --  64  BILITOT 0.6 1.0  --  0.8  PROT 6.4* 5.9*  --  5.4*  ALBUMIN 3.1* 2.7* 2.4* 2.4*   Recent Labs  Lab 03/10/2022 0955 03/23/22 0633  LIPASE 21 22  AMYLASE  --  18*   No results for input(s): "AMMONIA" in the last 168 hours.  ABG    Component Value Date/Time   TCO2 27 04/07/2015 2002     Coagulation Profile: No results for input(s): "INR", "PROTIME" in the last 168 hours.  Cardiac Enzymes: No results for input(s): "CKTOTAL", "CKMB", "CKMBINDEX", "TROPONINI" in the last 168 hours.  HbA1C: No results found for: "HGBA1C"  CBG: No results for input(s): "GLUCAP" in the last 168 hours.      Richardson Landry Channie Bostick ACNP Acute Care Nurse Practitioner [place] Please consult Amion 03/24/2022, 10:55 AM

## 2022-03-25 ENCOUNTER — Inpatient Hospital Stay (HOSPITAL_COMMUNITY): Payer: Medicare Other

## 2022-03-25 ENCOUNTER — Encounter (HOSPITAL_COMMUNITY): Payer: Self-pay | Admitting: Family Medicine

## 2022-03-25 ENCOUNTER — Encounter (HOSPITAL_COMMUNITY): Admission: EM | Disposition: E | Payer: Self-pay | Source: Home / Self Care | Attending: Internal Medicine

## 2022-03-25 DIAGNOSIS — J90 Pleural effusion, not elsewhere classified: Secondary | ICD-10-CM | POA: Diagnosis not present

## 2022-03-25 DIAGNOSIS — C9000 Multiple myeloma not having achieved remission: Secondary | ICD-10-CM | POA: Diagnosis not present

## 2022-03-25 DIAGNOSIS — Z66 Do not resuscitate: Secondary | ICD-10-CM

## 2022-03-25 DIAGNOSIS — J189 Pneumonia, unspecified organism: Secondary | ICD-10-CM | POA: Diagnosis not present

## 2022-03-25 HISTORY — PX: THORACENTESIS: SHX235

## 2022-03-25 LAB — LACTATE DEHYDROGENASE, PLEURAL OR PERITONEAL FLUID: LD, Fluid: 107 U/L — ABNORMAL HIGH (ref 3–23)

## 2022-03-25 LAB — PROTEIN, PLEURAL OR PERITONEAL FLUID: Total protein, fluid: <3 g/dL

## 2022-03-25 LAB — BASIC METABOLIC PANEL
Anion gap: 11 (ref 5–15)
BUN: 19 mg/dL (ref 8–23)
CO2: 18 mmol/L — ABNORMAL LOW (ref 22–32)
Calcium: 6.7 mg/dL — ABNORMAL LOW (ref 8.9–10.3)
Chloride: 107 mmol/L (ref 98–111)
Creatinine, Ser: 1.04 mg/dL — ABNORMAL HIGH (ref 0.44–1.00)
GFR, Estimated: 54 mL/min — ABNORMAL LOW (ref >60)
Glucose, Bld: 102 mg/dL — ABNORMAL HIGH (ref 70–99)
Potassium: 3.5 mmol/L (ref 3.5–5.1)
Sodium: 136 mmol/L (ref 135–145)

## 2022-03-25 LAB — BODY FLUID CELL COUNT WITH DIFFERENTIAL
Eos, Fluid: 0 %
Lymphs, Fluid: 51 %
Monocyte-Macrophage-Serous Fluid: 21 % — ABNORMAL LOW (ref 50–90)
Neutrophil Count, Fluid: 28 % — ABNORMAL HIGH (ref 0–25)
Total Nucleated Cell Count, Fluid: UNDETERMINED cu mm (ref 0–1000)

## 2022-03-25 LAB — CBC
HCT: 26.4 % — ABNORMAL LOW (ref 36.0–46.0)
Hemoglobin: 8.9 g/dL — ABNORMAL LOW (ref 12.0–15.0)
MCH: 32.5 pg (ref 26.0–34.0)
MCHC: 33.7 g/dL (ref 30.0–36.0)
MCV: 96.4 fL (ref 80.0–100.0)
Platelets: 101 10*3/uL — ABNORMAL LOW (ref 150–400)
RBC: 2.74 MIL/uL — ABNORMAL LOW (ref 3.87–5.11)
RDW: 20.2 % — ABNORMAL HIGH (ref 11.5–15.5)
WBC: 4.8 10*3/uL (ref 4.0–10.5)
nRBC: 0 % (ref 0.0–0.2)

## 2022-03-25 LAB — HEPARIN LEVEL (UNFRACTIONATED): Heparin Unfractionated: 0.37 IU/mL (ref 0.30–0.70)

## 2022-03-25 LAB — GLUCOSE, PLEURAL OR PERITONEAL FLUID: Glucose, Fluid: 115 mg/dL

## 2022-03-25 SURGERY — THORACENTESIS
Anesthesia: LOCAL

## 2022-03-25 MED ORDER — SODIUM CHLORIDE 0.9 % IV SOLN: INTRAVENOUS | Status: DC

## 2022-03-25 MED ORDER — CALCIUM GLUCONATE-NACL 1-0.675 GM/50ML-% IV SOLN
1.0000 g | Freq: Once | INTRAVENOUS | Status: AC
  Administered 2022-03-25: 1000 mg via INTRAVENOUS
  Filled 2022-03-25: qty 50

## 2022-03-25 MED ORDER — LEVOFLOXACIN 750 MG PO TABS
750.0000 mg | ORAL_TABLET | ORAL | Status: DC
  Administered 2022-03-25: 750 mg via ORAL
  Filled 2022-03-25: qty 1

## 2022-03-25 MED ORDER — DOXYCYCLINE HYCLATE 100 MG PO TABS
100.0000 mg | ORAL_TABLET | Freq: Two times a day (BID) | ORAL | Status: DC
  Administered 2022-03-25: 100 mg via ORAL
  Filled 2022-03-25: qty 1

## 2022-03-25 NOTE — Progress Notes (Signed)
   No new recommendations from yesterday.  Will sign off.  Please let us know if we can be of further assistance.  Candee Furbish, MD

## 2022-03-25 NOTE — Progress Notes (Signed)
[place] Progress Note Patient Name: Courtney Arnold DOB: January 01, 1941 MRN: [id number]   Date of Service  03/20/2022  HPI/Events of Note  Patient with increase in size of pneumothorax so patient placed on 100 % non re-breather to try to shrink size of pneumothorax.  eICU Interventions  Continue 100 % non re-breather overnight and repeat CXR in AM.        Leniya Breit U Shaun Zuccaro 03/29/2022, 11:57 PM

## 2022-03-25 NOTE — Progress Notes (Addendum)
   NAME:  Courtney Arnold, MRN:  [id number], DOB:  12-10-1940, LOS: 3 ADMISSION DATE:  03/26/2022, CONSULTATION DATE:  03/22/21 REFERRING MD:  Denton Brick  CHIEF COMPLAINT:  abd pain N and V while on chemo for refractory MM and now with R ML necrotizing pna   History of Present Illness:  82  yowf quit smoking 8 y PTA with  relapsed refractory MM and HBP ? Prior afib  admitted with 3 weeks of gen abd pain N and V with multifocal pna most dense in RML with cavity formation and PCCM service consulted pm 1/15   Pertinent  Medical History  82 y.o. female with multiple myeloma, atrial fibrillation, hypertension and history of cholecystectomy presenting for abdominal pain.  Abdominal pain started about 2 weeks PTA  States she is tender all over.  She has had 3 weeks of nausea and vomiting.  States her appetite has been really poor she is only drinking [facility] but cannot keep it down.  Denies fever or chills.  Denies diarrhea but still has been loose.  Denies hematemesis, hematochezia or melena and hematuria.  Daughter believes it to be related to new medication for her multiple myeloma which she started 6 months ago.  Medication is Pomalyst.  Daughter also mentioned that her skin appears more "yellow".  Taking weekly steroids.  Daughter stated   not currently being treated for atrial fibrillation.   Significant Hospital Events: Including procedures, antibiotic start and stop dates in addition to other pertinent events   MRSA  PCR  1/15 neg  BC x 2  1/15   Interim History / Subjective:  NAEON. Stable on 2L [place]. Feels slightly improved.  Objective   Blood pressure 113/68, pulse 98, temperature 97.6 F (36.4 C), temperature source Axillary, resp. rate 20, height '5\' 2"'$  (1.575 m), weight 57.3 kg, SpO2 95 %.    FiO2 (%):  [2 %] 2 %   Intake/Output Summary (Last 24 hours) at 03/20/2022 1111 Last data filed at 04/06/2022 0524 Gross per 24 hour  Intake 565.36 ml  Output 350 ml  Net 215.36 ml    Filed  Weights   03/23/22 1225  Weight: 57.3 kg    Examination: General: Frail elderly female in no acute distress HEENT: MM pink/moist, EOMI Neuro: MAE's, Hard of hearing CV: RRR, no M/R/G PULM: Faint crackles GI: soft, bsx4 active  GU: Voids Extremities: warm/dry, 1+ edema  Skin: no rashes or lesions    Assessment & Plan:   Multifocal pna with one area suggesting a necrotizing process in the RML ? Lung abscess vs opportunistic infection (immunocompromised host on Chemo for relapsed refractory MM) with necrosis eg nocardia or aspegillus or even mucormycosis.   More likely chronic aspiration given her frailty and esp with recent hx N/V Continue Cefepime Bronchial hygiene Mobilize as able No role for bronch at this point (also would avoid given her frail state etc and likelihood to not tolerate invasive procedures too well) Would consider SLP eval for swallow mechanics/aspiration risk/etc Continue PPI   Bilateral effusions. Plan for right thora this PM  Rest per primary team.   Montey Hora, [place] For pager details, please see AMION or use Epic chat  After 1900, please call [place] for cross coverage needs 03/17/2022, 11:17 AM

## 2022-03-25 NOTE — Care Management Important Message (Signed)
Important Message  Patient Details  Name: LUWANA BUTRICK MRN: [id number] Date of Birth: January 23, 1941   Medicare Important Message Given:  Yes     Shelda Altes 03/18/2022, 10:55 AM

## 2022-03-25 NOTE — Progress Notes (Signed)
[facility] Progress Note  Patient: Courtney Arnold     OZH:[id number]  DOA: 03/31/2022   PCP: Derek Jack, MD       Brief hospital course: This is an 82 year old female with multiple myeloma, paroxysmal atrial fibrillation, COPD, pancytopenia requiring packed red blood cell and platelet transfusions, hypothyroidism who presented to [facility] on 1/15 with nausea vomiting and poor oral intake for the past 3 weeks. CT of chest abdomen pelvis revealed multifocal pneumonia and possible abscess in the right middle lobe with moderate right and small left pleural effusions.  Subjective:  When I evaluated her this morning, she complained of some nausea but had no other complaints.  I was notified by the RN later that the patient had regurgitated on the very small amount of lunch that she had attempted to eat.  Assessment and Plan: Principal Problem:   Multifocal pneumonia with Lung Abscess and bilateral pleural effusions -CT reveals bilateral multifocal nodules with consolidation in the right middle lobe, right moderate pleural effusion and small left pleural effusion-near complete resolution of previously noted left upper lobe consolidation -Has undergone thoracentesis of right pleural space and 200 cc of cloudy fluid obtained-has a small iatrogenic pneumothorax - ID has evaluated the patient and recommended IV levofloxacin and when able to tolerate orals, switch to oral levofloxacin and add Bactrim 1 tab DS 3 times a week for PJP prophylaxis  Active Problems: Iatrogenic right pneumothorax - Postthoracentesis, she has a 5% right apical pneumothorax - Repeat chest x-ray and follow-up planned by pulmonary critical care team  Nausea/ vomiting -She has had vomiting for few weeks now and continues to do so in the hospital -Abdomen distended on exam - She did undergo a CT scan on 1/16 which revealed a surgically absent gallbladder, CBD of 8 mm, small hiatal hernia with  circumferential esophageal wall thickening suggestive of GERD or less likely esophageal neoplasm - The patient takes Decadron twice a week -Change Protonix to IV and increase to twice daily -Per family and RN, she vomits/regurgitates just within a few minutes of eating-will obtain SLP eval today due to her regurgitation-she may need a modified barium swallow  Multiple myeloma, relapsed Fatigue related to cancer - Receiving pomalidomide as outpatient with 1/16 to have been the date of her next cycle - Has lesions throughout her skeleton - Receives acyclovir prophylaxis twice a day, Decadron and Ritalin -Her oncologist has recommended today to hold pomalidomide Decadron and follow-up with him as outpatient to decide on whether or not to resume these  Paroxysmal atrial fibrillation with RVR -new diagnosis - [place] Vasc score is 4 - Placed on IV amiodarone and transitioned to oral amiodarone today -plan for 400 mg twice daily x 10 days followed by 200 mg daily and then outpatient follow-up -Continue heparin infusion and follow for bleeding for now  Pancytopenia - Continue to follow  Hypothyroidism -TSH 5.6-likely sick euthyroid - Continue levothyroxine       Code Status: DNR Consultants: Cardiology, pulmonary Level of Care: Level of care: Progressive Total time on patient care: 40 minutes DVT prophylaxis: Heparin infusion  Objective:   Vitals:   03/27/2022 1510 03/27/2022 1515 03/28/2022 1520 03/10/2022 1550  BP: 115/66 119/68 121/64 112/63  Pulse: (!) 113 (!) 112 (!) 106 100  Resp: (!) 27 (!) 26 (!) 32 20  Temp:    97.6 F (36.4 C)  TempSrc:    Oral  SpO2: 96% 96%  97%  Weight:      Height:  Filed Weights   03/23/22 1225 03/29/2022 1356  Weight: 57.3 kg 57.3 kg   Exam: General exam: Appears comfortable  HEENT: oral mucosa moist Respiratory system: Bilateral crackles Cardiovascular system: S1 & S2 heard  Gastrointestinal system: Abdomen soft, non-tender, moderately  distended normal bowel sounds   Extremities: No cyanosis, clubbing or edema Psychiatry: Flat affect      CBC: Recent Labs  Lab 03/24/2022 0955 03/23/22 0633 03/24/22 0621 03/13/2022 0556  WBC 2.2* 2.4* 3.5* 4.8  NEUTROABS 1.2*  --   --   --   HGB 7.5* 8.4* 8.0* 8.9*  HCT 23.5* 26.1* 24.6* 26.4*  MCV 99.6 98.9 97.6 96.4  PLT 90* 76* 85* 101*    Basic Metabolic Panel: Recent Labs  Lab 04/06/2022 0955 03/21/2022 1222 03/23/22 0500 03/23/22 0633 03/24/22 0621 03/23/2022 0556  NA 131*  --  133* 132*  --  136  K 4.4  --  4.0 4.0  --  3.5  CL 95*  --  102 102  --  107  CO2 22  --  19* 19*  --  18*  GLUCOSE 114*  --  111* 118*  --  102*  BUN 26*  --  21 20  --  19  CREATININE 0.83  --  0.82 0.85 0.85 1.04*  CALCIUM 7.9*  --  6.7* 6.8*  --  6.7*  MG  --  2.0  --   --   --   --   PHOS  --   --  2.8  --   --   --     GFR: Estimated Creatinine Clearance: 34.1 mL/min (A) (by C-G formula based on SCr of 1.04 mg/dL (H)).  Scheduled Meds:  sodium chloride   Intravenous Once   acyclovir  400 mg Oral BID   amiodarone  400 mg Oral BID   dextromethorphan-guaiFENesin  1 tablet Oral BID   levalbuterol  0.63 mg Nebulization BID   levofloxacin  750 mg Oral Q48H   levothyroxine  137 mcg Oral QAC breakfast   pantoprazole (PROTONIX) IV  40 mg Intravenous Q12H   sodium chloride flush  3 mL Intravenous Q12H   sodium chloride flush  3 mL Intravenous Q12H   Continuous Infusions:  sodium chloride     Imaging and lab data was personally reviewed DG Chest Port 1 View  Addendum Date: 03/28/2022   ADDENDUM REPORT: 03/27/2022 15:52 ADDENDUM: The original report was by Dr. Van Clines. The following addendum is by Dr. Van Clines: Critical Value/emergent results were called by telephone at the time of interpretation on 03/26/2022 at 3:45 pm to provider Elisabeth Cara SOOD , who verbally acknowledged these results. Electronically Signed   By: Van Clines M.D.   On: 04/07/2022 15:52    Result Date: 03/13/2022 CLINICAL DATA:  Status post right thoracentesis EXAM: PORTABLE CHEST 1 VIEW COMPARISON:  03/28/2022 at 6:11 a.m. FINDINGS: New 5% right pneumothorax. Indistinct airspace opacity in the right lower lobe and right middle lobe persists. Interstitial accentuation noted in both lungs. Mild enlargement of the cardiopericardial silhouette. Atherosclerotic calcification of the aortic arch. IMPRESSION: 1. New 5% right pneumothorax. 2. Indistinct airspace opacity in the right middle lobe and right lower lobe potentially from pneumonia or atelectasis. 3. Mild enlargement of the cardiopericardial silhouette. 4. Interstitial accentuation in both lungs. 5. Atherosclerotic calcification of the aortic arch. Radiology assistant personnel have been notified to put me in telephone contact with the referring physician or the referring physician's clinical representative in order to  discuss these findings. Once this communication is established I will issue an addendum to this report for documentation purposes. Electronically Signed: By: Van Clines M.D. On: 03/26/2022 15:33   DG Chest Port 1 View  Result Date: 03/08/2022 CLINICAL DATA:  Pneumonia. EXAM: PORTABLE CHEST 1 VIEW COMPARISON:  CT March 22, 2022. FINDINGS: The heart size and mediastinal contours are partially obscured but appear borderline enlarged. Moderate right and small left pleural effusions. Right middle lobe airspace consolidation. Additional patchy bilateral airspace consolidations. Prior vertebral body augmentation. IMPRESSION: Multifocal pneumonia with parapneumonic effusions. Electronically Signed   By: Dahlia Bailiff M.D.   On: 03/23/2022 08:25   DG Abd 1 View  Result Date: 03/24/2022 CLINICAL DATA:  Abdominal pain, distension, vomiting EXAM: ABDOMEN - 1 VIEW COMPARISON:  CT abdomen 03/14/2022 FINDINGS: Contrast medium in the urinary bladder. Lower thoracic vertebral augmentations noted. Bony demineralization. Gas is  present primarily in the colon. No dilated bowel observed. Blunting of the right costophrenic angle compatible with small right pleural effusion. Chronic L4 compression noted. Lower thoracic compressions are likewise chronic. IMPRESSION: 1. No dilated bowel observed. 2. Small right pleural effusion. 3. Bony demineralization with lower thoracic and lumbar compressions. 4. Contrast medium in the urinary bladder. Electronically Signed   By: Van Clines M.D.   On: 03/24/2022 14:56    LOS: 3 days   Author: Debbe Odea  03/13/2022 4:13 PM  To contact [facility]>   Check the care team in [facility] and look for the attending/consulting [place] provider listed  Log into [email or web address] and use [place]'s universal password   Go to> "[facility]"  and find provider  If you still have difficulty reaching the provider, please page the [facility] (Director on Call) for the Hospitalists listed on amion

## 2022-03-25 NOTE — Procedures (Signed)
Thoracentesis  Procedure Note  Courtney Arnold  [id number]  December 03, 1940  Date:03/09/2022  Time:3:07 PM   Provider Performing:Brysan Mcevoy   Procedure: Thoracentesis with imaging guidance (67672)  Indication(s) Pleural Effusion  Consent Risks of the procedure as well as the alternatives and risks of each were explained to the patient and/or caregiver.  Consent for the procedure was obtained and is signed in the bedside chart  Anesthesia Topical only with 1% lidocaine    Time Out Verified patient identification, verified procedure, site/side was marked, verified correct patient position, special equipment/implants available, medications/allergies/relevant history reviewed, required imaging and test results available.   Sterile Technique Maximal sterile technique including full sterile barrier drape, hand hygiene, sterile gown, sterile gloves, mask, hair covering, sterile ultrasound probe cover (if used).  Procedure Description Ultrasound was used to identify appropriate pleural anatomy for placement and overlying skin marked.  Area of drainage cleaned and draped in sterile fashion. Lidocaine was used to anesthetize the skin and subcutaneous tissue.  200 cc's of yellow slightly cloudy appearing fluid was drained from the right pleural space. Catheter then removed and bandaid applied to site.   Complications/Tolerance None; patient tolerated the procedure well. Chest X-ray is ordered to confirm no post-procedural complication.   EBL none   Specimen(s) Pleural fluid for glucose, protein, LDH, cell count, gram stain and culture and cytology.  [name], MD [place] Pager - [phone] or [phone] 04/02/2022, 3:08 PM

## 2022-03-25 NOTE — Progress Notes (Signed)
PCCM interval progress note:  CXR with slightly enlarged pneumo, pt seen and is sleeping soundly on 2L, complains of no shortness of breath.  Will ask for venti mask to hyper-oxygenate, repeat CXR in the AM.   Otilio Carpen Braelen Sproule, PA-C

## 2022-03-25 NOTE — Progress Notes (Addendum)
ANTICOAGULATION CONSULT NOTE  Pharmacy Consult for heparin Indication: atrial fibrillation  No Known Allergies  Patient Measurements: Height: '5\' 2"'$  (157.5 cm) Weight: 57.3 kg (126 lb 5.2 oz) IBW/kg (Calculated) : 50.1 Heparin Dosing Weight: TBW  Vital Signs: Temp: 97.8 F (36.6 C) (01/18 0406) Temp Source: Oral (01/18 0406) BP: 91/58 (01/18 0406) Pulse Rate: 98 (01/18 0406)  Labs: Recent Labs    03/16/2022 0955 04/04/2022 1222 03/23/22 0500 03/23/22 2010 03/24/22 0621 03/24/22 2041 03/13/2022 0556  HGB 7.5*  --   --  8.4* 8.0*  --  8.9*  HCT 23.5*  --   --  26.1* 24.6*  --  26.4*  PLT 90*  --   --  76* 85*  --  101*  HEPARINUNFRC  --   --   --   --   --  <0.10* 0.37  CREATININE 0.83  --    < > 0.85 0.85  --  1.04*  TROPONINIHS 10 10  --   --   --   --   --    < > = values in this interval not displayed.     Estimated Creatinine Clearance: 34.1 mL/min (A) (by C-G formula based on SCr of 1.04 mg/dL (H)).   Assessment: 82 yo female with new onset Afib w/ RVR. PMH includes multiple myeloma on oral chemo, chronic anemia/pancytopenia requiring transfusions. CHADs-VASc = 4. No anticoag PTA. Ordered to start Lovenox '1mg'$ /kg on 1/18 but was discontinued for hgb 7.5 (transfused) - no doses given. Cardiology following. Keep AC unless platelets drop below 50 per cardiology rec's. Pharmacy consulted to dose IV heparin for atrial fibrillation. Plan to transition to Eliquis if platelets remains stable on IV heparin.   Heparin level therapeutic at 0.37. Platelets up to 101, Hgb 8.9 (at baseline).   Goal of Therapy:  Heparin level 0.3-0.7 units/ml Monitor platelets by anticoagulation protocol: Yes   Plan:  Continue heparin infusion at 1000 units/hr Check confirmatory heparin level in 8 hours Daily heparin level and CBC - Watch platelets closely  Dimple Nanas, PharmD, BCPS 03/27/2022 7:57 AM  PM UPDATE: - Per endo, stop heparin now in prep for thoracentesis at 14:30 today -  Discontinue 1400 heparin level and f/u resume heparin drip after procedure  Dimple Nanas, PharmD, BCPS 04/02/2022 1:11 PM

## 2022-03-25 NOTE — Plan of Care (Signed)

## 2022-03-25 NOTE — Progress Notes (Signed)
Pt noted with no urinary output since am.  Bladder scan showed 619 in bladder.  Pt unaware and unable to urinate.  In/out cath revealed 700 ml clear, yellow urine.  Pericare performed before and after catheterization and clean purewick placed.

## 2022-03-25 NOTE — Progress Notes (Addendum)
Post thoracentesis CXR shows 5% Rt apical pneumothorax.  Hemodynamics stable.  Will f/u CXR later tonight and tomorrow morning.  Defer chest tube placement unless pneumothorax progresses.  [name], MD [place] Pager - [phone] 03/31/2022, 3:41 PM   Updated pt's daughter about pleural fluid appearance, small pneumothorax, plan for follow up CXR and holding heparin gtt for now.  [name], MD [place] Pager - [phone] 03/15/2022, 4:29 PM

## 2022-03-25 NOTE — Progress Notes (Signed)
SLP Cancellation Note  Patient Details Name: Courtney Arnold MRN: [id number] DOB: 15-Oct-1940   Cancelled treatment:       Reason Eval/Treat Not Completed: Patient at procedure or test/unavailable   Christyanna Mckeon, Katherene Ponto 03/09/2022, 2:27 PM

## 2022-03-25 NOTE — Consult Note (Signed)
[place] for Infectious Disease    Date of Admission:  03/24/2022     Reason for Consult: pneumonia    Referring Provider: Debbe Odea   Lines:  Peripheral iv's  Abx: 1/18-c levoflox      Outpatient acyclovir 400 mg po bid continued  1/15-18 cefepime/flagyl 1/15-16 vanc  Assessment: with relapsed/refractory MM on chemo (velcade, pomalidomide, dexamethasone), pAfib, hypothyroidism, copd admitted for abd pain/nausea and failure to thrive of 1-2 weeks, found to have multifocal pneumonia   1/15 mrsa nares cx negative 1/15 blood culture negative  Ct with pulm nodules and most severe air-space disease rul. Nodules are sub-cm without halo/reverse halo sign. I suspect this is community acquired pna rather than exotic organism such as pjp/nocardia. MM without stem cell transplant are usually not a host for invasive mold infection.   There is some improvement with cough/abd pain, but too early to tell  This is Courtney Arnold second pneumonia. 09/2021 had pna as well and resolved with short CAP tx course   She is at risk for recurrent bacterial pna, pjp, and hsv/vzv outbreak and will likely benefit from prophylaxis    Plan: I am not sure palliative care has been discussed, but would consider palliative care For pna treatment at this time, would change to levofloxacin 750 mg daily and can keep IV for now given Courtney Arnold nausea. Stop cefepime She is on a high risk for infection with Courtney Arnold chemo regimen and would benefit from levofloxacin ongoing prophylaxis along with pjp prophylaxis as well; bactrim 1 ds tablet tiw We discussed with Courtney Arnold primary oncologist dr Delton Coombes who states no DDI with bactrim/levo and Courtney Arnold chemo Continue acyclovir prophylaxis  I have low suspicion for fungal or process such as nocardia/pjp active infection at this time She does have R>L effusion and if clinically don't improve in another 2-3 days would repeat chest ct and see if we need to do thoracentesis for  diagnostic purpose or consider other organism such as nocardia Sputum culture if able Courtney Arnold oncologist has recommended holding chemo while infection being treated Discussed with primary team    I spent 90 minute reviewing data/chart, and coordinating care and >50% direct face to face time providing counseling/discussing diagnostics/treatment plan with patient   ------------------------------------------------ Principal Problem:   Multifocal pneumonia with Lung Abscess Active Problems:   Hypothyroidism following radioiodine therapy   Hypothyroidism   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; sacral bone biopsy -multiple lytic lesions spine/ribs; pathologic fracture right femur -FISH panel high risk genetics -relapsed after 01/2020 treatment course with RVD -refractory to current DPD treatment -- 02/17/2022 improved M spike but increased free light chain ratio 11.67 --> 15.5 -prophylaxis -- on acyclovir -therapy 4/28-11/06/2019 RVD x9 cycles 09/15/21-current Pomalidomide, daratumumab and dexamethasone; cycle q28days  Courtney Arnold last chemo cycle resets/restarts 03/13/2022    At baseline able to do Courtney Arnold home chores but unclear if this is accurate   Hospital course: Afebrile; hds;  hypoxic requires 3 liters o2 via [place] Lft normal Lactate normal Cbc 2.2/7.5/90 improved from 03/17/22 office visit; absolute neutrophil count 1200 Chest ct multifocal pna most severe right upper lobe; moderate right pleural effusion; trace left pleural effusion Small sub cm nodules    Started on bsAbx  Pulmonlogy saw and too high risk to do bronch  Patient improved cough and abd pain last 24 hours. O2 requirement stable. However has nausea/vomiting  Mrsa nares cx negative Admission blood cx negative  No respiratory culture yet. Unclear if she is able to make sputum  Family History  Problem Relation Age of Onset   Heart attack Father    Stroke Father    Breast cancer Sister    Thyroid disease Neg Hx    Colon cancer Neg Hx     Social History   Tobacco Use   Smoking status: Former    Packs/day: 0.15    Years: 20.00    Total pack years: 3.00    Types: Cigarettes    Quit date: 03/08/2013    Years since quitting: 9.0   Smokeless tobacco: Never  Vaping Use   Vaping Use: Never used  Substance Use Topics   Alcohol use: No    Alcohol/week: 0.0 standard drinks of alcohol   Drug use: No    No Known Allergies  Review of Systems: ROS All Other ROS was negative, except mentioned above   Past Medical History:  Diagnosis Date   Atrial fibrillation ([place]) 2011   Postop, spontaneous conversion to normal sinus after one hour   CAP (community acquired pneumonia) 09/23/2021   Cholecystitis, acute 04/25/2015   Compression fracture 07/24/2009   T12; kyphoplasty   History of echocardiogram 07/2009   EF 65%   Hypertension    Thyroid disease    Tobacco abuse        Scheduled Meds:  sodium chloride   Intravenous Once   acyclovir  400 mg Oral BID   amiodarone  400 mg Oral BID   dextromethorphan-guaiFENesin  1 tablet Oral BID   doxycycline  100 mg Oral Q12H   levalbuterol  0.63 mg Nebulization BID   levothyroxine  137 mcg Oral QAC breakfast   pantoprazole (PROTONIX) IV  40  mg Intravenous Q12H   sodium chloride flush  3 mL Intravenous Q12H   sodium chloride flush  3 mL Intravenous Q12H   Continuous Infusions:  sodium chloride     ceFEPime (MAXIPIME) IV 2 g (03/15/2022 0957)   heparin 1,000 Units/hr (03/24/22 2125)   PRN Meds:.sodium chloride, acetaminophen **OR** acetaminophen, albuterol, bisacodyl, ondansetron **OR** ondansetron (ZOFRAN) IV, polyethylene glycol, polyvinyl alcohol, sodium chloride flush, traMADol, traZODone   OBJECTIVE: Blood pressure 113/68, pulse 98, temperature 97.6 F (36.4 C), temperature source Axillary, resp. rate 20, height '5\' 2"'$  (1.575 m), weight 57.3 kg, SpO2 95 %.  Physical Exam  General/constitutional: no distress, chronically ill appearing; cooperative; conversant; on 3 liters o2 via [place] HEENT: Normocephalic, PER, Conj Clear, EOMI, Oropharynx clear Neck supple CV: rrr no mrg Lungs: clear to auscultation, normal respiratory effort Abd: Soft, Nontender Ext: no edema Skin: No Rash Neuro: generalized weakness MSK: no peripheral joint swelling/tenderness/warmth; back spines nontender  Lab Results Lab Results  Component Value Date   WBC 4.8 03/21/2022   HGB 8.9 (L) 03/21/2022   HCT 26.4 (L)  03/08/2022   MCV 96.4 03/29/2022   PLT 101 (L) 03/23/2022    Lab Results  Component Value Date   CREATININE 1.04 (H) 03/09/2022   BUN 19 03/14/2022   NA 136 03/24/2022   K 3.5 03/21/2022   CL 107 03/13/2022   CO2 18 (L) 03/21/2022    Lab Results  Component Value Date   ALT 15 03/23/2022   AST 10 (L) 03/23/2022   ALKPHOS 64 03/23/2022   BILITOT 0.8 03/23/2022      Microbiology: Recent Results (from the past 240 hour(s))  Culture, blood (routine x 2)     Status: None (Preliminary result)   Collection Time: 03/28/2022 11:19 AM   Specimen: BLOOD RIGHT HAND  Result Value Ref Range Status   Specimen Description BLOOD RIGHT HAND  Final   Special Requests   Final    BOTTLES DRAWN AEROBIC ONLY Blood Culture results may not be  optimal due to an inadequate volume of blood received in culture bottles   Culture   Final    NO GROWTH 3 DAYS Performed at [facility], [address] [place], [place] [address]    Report Status PENDING  Incomplete  Culture, blood (routine x 2)     Status: None (Preliminary result)   Collection Time: 04/03/2022 12:22 PM   Specimen: BLOOD  Result Value Ref Range Status   Specimen Description BLOOD  Final   Special Requests NONE BLOOD RIGHT ARM  Final   Culture   Final    NO GROWTH 3 DAYS Performed at [facility] [address], [place], [place] [address]    Report Status PENDING  Incomplete  MRSA Next Gen by PCR, Nasal     Status: None   Collection Time: 04/03/2022  2:27 PM   Specimen: Nasal Mucosa; Nasal Swab  Result Value Ref Range Status   MRSA by PCR Next Gen NOT DETECTED NOT DETECTED Final    Comment: (NOTE) The GeneXpert MRSA Assay (FDA approved for NASAL specimens only), is one component of a comprehensive MRSA colonization surveillance program. It is not intended to diagnose MRSA infection nor to guide or monitor treatment for MRSA infections. Test performance is not FDA approved in patients less than 37 years old. Performed at [facility], [address], [place], [place] [address]      Serology:    Imaging: If present, new imagings (plain films, ct scans, and mri) have been personally visualized and interpreted; radiology reports have been reviewed. Decision making incorporated into the Impression / Recommendations.  1/18 chest xray FINDINGS: The heart size and mediastinal contours are partially obscured but appear borderline enlarged.   Moderate right and small left pleural effusions. Right middle lobe airspace consolidation. Additional patchy bilateral airspace consolidations.   Prior vertebral body augmentation.   IMPRESSION: Multifocal pneumonia with parapneumonic effusions.   1/15 chest ct without contrast 1. Interval development of multifocal new  nodules and consolidation, predominantly involving the right middle lobe, where there is a gas-containing fluid collection measuring 2.2 x 1.6 cm in the base of the right middle lobe. Findings are most consistent with multifocal pneumonia with abscess formation. 2. Near-complete resolution of previously noted left upper lobe consolidation. 3. Moderate right and trace left pleural effusions. 4. Diffusely heterogeneous appearance of the skeleton in keeping with known multiple myeloma. 5. Coronary artery calcifications. Aortic Atherosclerosis   1/15 tte  1. Left ventricular ejection fraction, by estimation, is 55 to 60%. The  left ventricle has normal function. Left ventricular  endocardial border  not optimally defined to evaluate regional wall motion. Left ventricular  diastolic function could not be  evaluated.   2. Right ventricular systolic function is normal. The right ventricular  size is mildly enlarged. There is moderately elevated pulmonary artery  systolic pressure. The estimated right ventricular systolic pressure is  67.3 mmHg.   3. Left atrial size was severely dilated.   4. Right atrial size was severely dilated.   5. The mitral valve is abnormal. Mild mitral valve regurgitation. No  evidence of mitral stenosis.   6. Tricuspid valve regurgitation is mild to moderate.   7. The aortic valve was not well visualized. Aortic valve regurgitation  is trivial. No aortic stenosis is present.   8. The inferior vena cava is normal in size with <50% respiratory  variability, suggesting right atrial pressure of 8 mmHg.   Comparison(s): No prior Echocardiogram.    Prior admission: 09/2021 chest ct -Multifocal upper lobe pneumonia, left greater than right. Associated small left pleural effusion. Consider follow-up chest radiographs in 4-6 weeks to document clearance. -Multiple lytic lesions, compatible with the patient's known multiple myeloma.   Jabier Mutton, [facility] [place] [phone] pager    03/29/2022, 12:39 PM

## 2022-03-26 ENCOUNTER — Inpatient Hospital Stay (HOSPITAL_COMMUNITY): Payer: Medicare Other

## 2022-03-26 DIAGNOSIS — C9000 Multiple myeloma not having achieved remission: Secondary | ICD-10-CM | POA: Diagnosis not present

## 2022-03-26 DIAGNOSIS — J189 Pneumonia, unspecified organism: Secondary | ICD-10-CM | POA: Diagnosis not present

## 2022-03-26 DIAGNOSIS — Z87891 Personal history of nicotine dependence: Secondary | ICD-10-CM

## 2022-03-26 DIAGNOSIS — J90 Pleural effusion, not elsewhere classified: Secondary | ICD-10-CM | POA: Diagnosis not present

## 2022-03-26 LAB — TYPE AND SCREEN
ABO/RH(D): O POS
Antibody Screen: POSITIVE
Donor AG Type: NEGATIVE
Donor AG Type: NEGATIVE
Unit division: 0
Unit division: 0

## 2022-03-26 LAB — BPAM RBC
Blood Product Expiration Date: 202402022359
Blood Product Expiration Date: 202402022359
ISSUE DATE / TIME: 202401152000
ISSUE DATE / TIME: 202401152000
Unit Type and Rh: 5100
Unit Type and Rh: 5100

## 2022-03-26 LAB — CBC
HCT: 25 % — ABNORMAL LOW (ref 36.0–46.0)
Hemoglobin: 8.3 g/dL — ABNORMAL LOW (ref 12.0–15.0)
MCH: 32.2 pg (ref 26.0–34.0)
MCHC: 33.2 g/dL (ref 30.0–36.0)
MCV: 96.9 fL (ref 80.0–100.0)
Platelets: 104 10*3/uL — ABNORMAL LOW (ref 150–400)
RBC: 2.58 MIL/uL — ABNORMAL LOW (ref 3.87–5.11)
RDW: 20 % — ABNORMAL HIGH (ref 11.5–15.5)
WBC: 4.1 10*3/uL (ref 4.0–10.5)
nRBC: 0.5 % — ABNORMAL HIGH (ref 0.0–0.2)

## 2022-03-26 LAB — COMPREHENSIVE METABOLIC PANEL
ALT: 11 U/L (ref 0–44)
AST: 12 U/L — ABNORMAL LOW (ref 15–41)
Albumin: 2 g/dL — ABNORMAL LOW (ref 3.5–5.0)
Alkaline Phosphatase: 50 U/L (ref 38–126)
Anion gap: 8 (ref 5–15)
BUN: 21 mg/dL (ref 8–23)
CO2: 20 mmol/L — ABNORMAL LOW (ref 22–32)
Calcium: 6.4 mg/dL — CL (ref 8.9–10.3)
Chloride: 107 mmol/L (ref 98–111)
Creatinine, Ser: 1.05 mg/dL — ABNORMAL HIGH (ref 0.44–1.00)
GFR, Estimated: 54 mL/min — ABNORMAL LOW (ref >60)
Glucose, Bld: 97 mg/dL (ref 70–99)
Potassium: 3.5 mmol/L (ref 3.5–5.1)
Sodium: 135 mmol/L (ref 135–145)
Total Bilirubin: 1.1 mg/dL (ref 0.3–1.2)
Total Protein: 4.8 g/dL — ABNORMAL LOW (ref 6.5–8.1)

## 2022-03-26 LAB — HEPARIN LEVEL (UNFRACTIONATED): Heparin Unfractionated: <0.1 IU/mL — ABNORMAL LOW (ref 0.30–0.70)

## 2022-03-26 MED ORDER — LEVOFLOXACIN 250 MG PO TABS: 250.0000 mg | ORAL_TABLET | Freq: Every day | ORAL | Status: DC

## 2022-03-26 MED ORDER — LEVOFLOXACIN IN D5W 750 MG/150ML IV SOLN
750.0000 mg | INTRAVENOUS | Status: AC
  Administered 2022-03-27 – 2022-03-31 (×3): 750 mg via INTRAVENOUS
  Filled 2022-03-26 (×3): qty 150

## 2022-03-26 MED ORDER — CALCIUM GLUCONATE-NACL 1-0.675 GM/50ML-% IV SOLN
1.0000 g | Freq: Once | INTRAVENOUS | Status: AC
  Administered 2022-03-26: 1000 mg via INTRAVENOUS
  Filled 2022-03-26: qty 50

## 2022-03-26 MED ORDER — LEVOFLOXACIN IN D5W 750 MG/150ML IV SOLN: 750.0000 mg | INTRAVENOUS | Status: DC

## 2022-03-26 MED ORDER — SULFAMETHOXAZOLE-TRIMETHOPRIM 800-160 MG PO TABS
1.0000 | ORAL_TABLET | ORAL | Status: DC
  Administered 2022-03-26 – 2022-03-29 (×2): 1 via ORAL
  Filled 2022-03-26 (×3): qty 1

## 2022-03-26 MED ORDER — POTASSIUM CHLORIDE IN NACL 20-0.9 MEQ/L-% IV SOLN
INTRAVENOUS | Status: DC
  Filled 2022-03-26 (×4): qty 1000

## 2022-03-26 NOTE — Progress Notes (Addendum)
[facility] for heparin Indication: atrial fibrillation   Assessment: 82 yo female with new onset Afib w/ RVR. PMH includes multiple myeloma on oral chemo, chronic anemia/pancytopenia requiring transfusions. CHADs-VASc = 4. No anticoag PTA. Ordered to start Lovenox '1mg'$ /kg on 1/18 but was discontinued for hgb 7.5 (transfused) - no doses given. Cardiology following. Keep AC unless platelets drop below 50 per cardiology rec's. Pharmacy consulted to dose IV heparin for atrial fibrillation. Plan to transition to Eliquis if platelets remains stable on IV heparin.   S/p thoracentesis 1/18 - heparin drip not resumed after due to development of pneumothorax afterwards.   Goal of Therapy:  Heparin level 0.3-0.7 units/ml Monitor platelets by anticoagulation protocol: Yes   Plan:  Continue to hold heparin per discussion with CCM as patient may require chest tube Pharmacy will sign off consult - please re-consult if/when heparin drip is to be resumed  Dimple Nanas, PharmD, BCPS 03/26/2022 12:06 PM

## 2022-03-26 NOTE — Progress Notes (Signed)
[facility] Progress Note  Patient: Courtney Arnold     HWE:[id number]  DOA: 03/29/2022   PCP: Courtney Jack, MD       Brief hospital course: This is an 82 year old female with multiple myeloma, paroxysmal atrial fibrillation, COPD, pancytopenia requiring packed red blood cell and platelet transfusions, hypothyroidism who presented to [facility] on 1/15 with nausea vomiting and poor oral intake for the past 3 weeks.  CT of chest abdomen pelvis revealed multifocal pneumonia and possible abscess in the right middle lobe with moderate right and small left pleural effusions. She was transferred to [facility] on 1/16 at the recommendation of pulmonary to obtain consult with pulmonary here. She developed A fib with RVR and was started on an Amio infusion 1/17> PCCM recommends to hold off on Bronch, cont abx, swich IV Amio to oral 1/18> thoracentesis of right pleural space and 200 cc of cloudy fluid obtained-has a small iatrogenic pneumothorax- see details below ID consult> start Levaquin, Discussion with her oncologist- see below   Subjective:  No nausea this AM. She states she had a good night.  Notified in the afternoon that he has had 4 episodes of diarrhea.  Assessment and Plan: Principal Problem:   Multifocal pneumonia with Lung Abscess and bilateral pleural effusions -CT reveals bilateral multifocal nodules with consolidation in the right middle lobe, right moderate pleural effusion and small left pleural effusion-near complete resolution of previously noted left upper lobe consolidation  - ID has evaluated the patient and recommended IV levofloxacin and when able to tolerate orals, switch to oral levofloxacin and add Bactrim 1 tab DS 3 times a week for PJP prophylaxis  Active Problems: Iatrogenic right pneumothorax - Postthoracentesis, she has a 5% right apical pneumothorax which improved to 10 % and has improved to 10 % today-   Nausea/ vomiting -She has had  vomiting for few weeks now and continued to do so in the hospital -Abdomen distended on exam - She did undergo a CT scan on 1/16 which revealed a surgically absent gallbladder, CBD of 8 mm, small hiatal hernia with circumferential esophageal wall thickening suggestive of GERD or less likely esophageal neoplasm - The patient takes Decadron twice a week -Per family and RN, she vomits/regurgitates just within a few minutes of eating - SLP eval done- no signs of aspiration - not a candidate for EGD at this time due to poor respiratory status - no thrush noted - need to continue Protonix IV BID for possible esophagitis  Diarrhea - possibly from antibiotics- follow  Multiple myeloma, relapsed Fatigue related to cancer - Receiving pomalidomide as outpatient with 1/16 to have been the date of her next cycle - Has lesions throughout her skeleton - Receives acyclovir prophylaxis twice a day, Decadron and Ritalin -Her oncologist has recommended today to hold Pomalidomide & Decadron and follow-up with him as outpatient to decide on whether or not to resume these  Paroxysmal atrial fibrillation with RVR -new diagnosis - [place] Vasc score is 4 - Placed on IV amiodarone and transitioned to oral amiodarone today -plan for 400 mg twice daily x 10 days followed by 200 mg daily and then outpatient follow-up -Continue heparin infusion and follow for bleeding for now  Pancytopenia - Continue to follow  Hypothyroidism -TSH 5.6-likely sick euthyroid - Continue levothyroxine   I went back to the room to re-evaulate the patient in the afternoon and had a discussion with her daughter.. She does not feel her mother can go home as she  is unable to take care of her 24 hrs a day and has no one to care for her. Have communicated this with TOC.      Code Status: DNR Consultants: Cardiology, pulmonary Level of Care: Level of care: Progressive Total time on patient care: 45 minutes DVT prophylaxis: Heparin  infusion  Objective:   Vitals:   03/26/22 0855 03/26/22 0925 03/26/22 0926 03/26/22 1239  BP: 103/60   (!) 96/53  Pulse: (!) 101   99  Resp: (!) 22   (!) 26  Temp:    (!) 97 F (36.1 C)  TempSrc:    Oral  SpO2:  100% 100% 100%  Weight:      Height:       Filed Weights   03/23/22 1225 04/01/2022 1356  Weight: 57.3 kg 57.3 kg   Exam: General exam: Appears comfortable  HEENT: oral mucosa very dry- dried blood on right nostril unchanged Respiratory system: on 100% face mask, respiratory rate stable, crackles in right lung field Cardiovascular system: S1 & S2 heard  Gastrointestinal system: Abdomen soft, not tender, moderately distended. Normal bowel sounds   Extremities: No cyanosis, clubbing or edema Psychiatry:  flat    CBC: Recent Labs  Lab 04/06/2022 0955 03/23/22 0633 03/24/22 0621 04/06/2022 0556 03/26/22 0205  WBC 2.2* 2.4* 3.5* 4.8 4.1  NEUTROABS 1.2*  --   --   --   --   HGB 7.5* 8.4* 8.0* 8.9* 8.3*  HCT 23.5* 26.1* 24.6* 26.4* 25.0*  MCV 99.6 98.9 97.6 96.4 96.9  PLT 90* 76* 85* 101* 104*    Basic Metabolic Panel: Recent Labs  Lab 03/21/2022 0955 03/20/2022 1222 03/23/22 0500 03/23/22 0633 03/24/22 0621 03/12/2022 0556 03/26/22 0205  NA 131*  --  133* 132*  --  136 135  K 4.4  --  4.0 4.0  --  3.5 3.5  CL 95*  --  102 102  --  107 107  CO2 22  --  19* 19*  --  18* 20*  GLUCOSE 114*  --  111* 118*  --  102* 97  BUN 26*  --  21 20  --  19 21  CREATININE 0.83  --  0.82 0.85 0.85 1.04* 1.05*  CALCIUM 7.9*  --  6.7* 6.8*  --  6.7* 6.4*  MG  --  2.0  --   --   --   --   --   PHOS  --   --  2.8  --   --   --   --     GFR: Estimated Creatinine Clearance: 33.8 mL/min (A) (by C-G formula based on SCr of 1.05 mg/dL (H)).  Scheduled Meds:  sodium chloride   Intravenous Once   acyclovir  400 mg Oral BID   amiodarone  400 mg Oral BID   dextromethorphan-guaiFENesin  1 tablet Oral BID   levalbuterol  0.63 mg Nebulization BID   levothyroxine  137 mcg Oral QAC  breakfast   pantoprazole (PROTONIX) IV  40 mg Intravenous Q12H   sodium chloride flush  3 mL Intravenous Q12H   sodium chloride flush  3 mL Intravenous Q12H   sulfamethoxazole-trimethoprim  1 tablet Oral Once per day on Mon Wed Fri   Continuous Infusions:  sodium chloride     sodium chloride 75 mL/hr at 03/29/2022 2054   [START ON 03/27/2022] levofloxacin (LEVAQUIN) IV     Imaging and lab data was personally reviewed DG CHEST PORT 1 VIEW  Result Date:  03/26/2022 CLINICAL DATA:  [id number] Pneumothorax on right [id number] EXAM: PORTABLE CHEST 1 VIEW COMPARISON:  Chest radiograph from one day prior. FINDINGS: Stable cardiomediastinal silhouette with normal heart size. Small right hydropneumothorax with stable small 5% right apical pneumothorax component and stable small basilar right pleural effusion component. No left pneumothorax. No left pleural effusion. Stable patchy consolidation throughout the right greater than left lungs, most prominent in the right perihilar lung. IMPRESSION: 1. Stable small right hydropneumothorax. 2. Stable patchy consolidation throughout the right greater than left lungs, most prominent in the right perihilar lung, compatible with multilobar pneumonia. Electronically Signed   By: Ilona Sorrel M.D.   On: 03/26/2022 08:19   DG Chest Port 1 View  Result Date: 03/26/2022 CLINICAL DATA:  History of recent thoracentesis, follow-up right pneumothorax. EXAM: PORTABLE CHEST 1 VIEW COMPARISON:  Films from earlier in the same day. FINDINGS: Right-sided pneumothorax is noted slightly increased in the lateral component when compare with the prior exam. The apical component is less well visualized. Persistent airspace opacity in the right lung is noted. Skin fold is noted over the left lateral chest. Old rib fractures are seen on the left. Cardiac shadow is stable. Changes of prior vertebral augmentation are noted in the thoracic spine. IMPRESSION: Right-sided pneumothorax is again identified  with slight increase in the lateral component. There is approximately 12 mm of excursion laterally. The apical component is less well appreciated. The remainder of the exam is stable from the prior study. Electronically Signed   By: Inez Catalina M.D.   On: 03/20/2022 20:22   DG Chest Port 1 View  Addendum Date: 03/29/2022   ADDENDUM REPORT: 04/07/2022 15:52 ADDENDUM: The original report was by Dr. [place] Clines. The following addendum is by Dr. [place] Clines: Critical Value/emergent results were called by telephone at the time of interpretation on 03/16/2022 at 3:45 pm to provider Elisabeth Cara SOOD , who verbally acknowledged these results. Electronically Signed   By: [place] Clines M.D.   On: 03/21/2022 15:52   Result Date: 03/29/2022 CLINICAL DATA:  Status post right thoracentesis EXAM: PORTABLE CHEST 1 VIEW COMPARISON:  03/29/2022 at 6:11 a.m. FINDINGS: New 5% right pneumothorax. Indistinct airspace opacity in the right lower lobe and right middle lobe persists. Interstitial accentuation noted in both lungs. Mild enlargement of the cardiopericardial silhouette. Atherosclerotic calcification of the aortic arch. IMPRESSION: 1. New 5% right pneumothorax. 2. Indistinct airspace opacity in the right middle lobe and right lower lobe potentially from pneumonia or atelectasis. 3. Mild enlargement of the cardiopericardial silhouette. 4. Interstitial accentuation in both lungs. 5. Atherosclerotic calcification of the aortic arch. Radiology assistant personnel have been notified to put me in telephone contact with the referring physician or the referring physician's clinical representative in order to discuss these findings. Once this communication is established I will issue an addendum to this report for documentation purposes. Electronically Signed: By: [place] Clines M.D. On: 04/02/2022 15:33   DG Chest Port 1 View  Result Date: 03/29/2022 CLINICAL DATA:  Pneumonia. EXAM: PORTABLE CHEST 1 VIEW  COMPARISON:  CT March 22, 2022. FINDINGS: The heart size and mediastinal contours are partially obscured but appear borderline enlarged. Moderate right and small left pleural effusions. Right middle lobe airspace consolidation. Additional patchy bilateral airspace consolidations. Prior vertebral body augmentation. IMPRESSION: Multifocal pneumonia with parapneumonic effusions. Electronically Signed   By: Dahlia Bailiff M.D.   On: 04/07/2022 08:25    LOS: 4 days   Author: Debbe Odea  03/26/2022 3:24 PM  To  contact [facility]>   Check the care team in [facility] and look for the attending/consulting [facility] provider listed  Log into [email or web address] and use [place]'s universal password   Go to> "[facility]"  and find provider  If you still have difficulty reaching the provider, please page the [facility] (Director on Call) for the Hospitalists listed on amion

## 2022-03-26 NOTE — Progress Notes (Signed)
Palliative Care Progress Note   Patient Name: Courtney Arnold       Date: 03/26/2022 DOB: [birth year]  Age: 82 y.o. MRN#: [id number] Attending Physician: Debbe Odea, MD Primary Care Physician: Derek Jack, MD Admit Date: 03/21/2022  After reviewing the patient's chart, I attempted to assess patient at bedside.  She was off the unit for procedure.  Family was in room.  Emotional support provided.  No acute palliative needs today.  PMT will continue to follow.  Patient has PMT contact information and was encouraged to call with any future palliative concerns or questions.  Thank you for allowing the Palliative Medicine Team to assist in the care of Courtney Arnold.  Tangier Ilsa Iha, FNP-BC Palliative Medicine Team Team Phone # [phone]  No charge

## 2022-03-26 NOTE — Progress Notes (Signed)
Placed pt on 100% non-rebreather per order, Patient sating 100% . Denies pain or difficulty breathing. Patient stated she is very tired and wants to rest. Plan of care continues.

## 2022-03-26 NOTE — Progress Notes (Signed)
Lab called about calcium of 6.2, On call MD DR. Alcario Drought notified. New order received. Plan of care continues.

## 2022-03-26 NOTE — Progress Notes (Signed)
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 Nodules are sub-cm without halo/reverse halo sign. I suspect this is community acquired pna rather than exotic organism such as pjp/nocardia. MM without stem cell transplant are usually not a host for invasive mold infection.    There is some improvement with cough/abd pain, but too early to tell   This is her second pneumonia. 09/2021 had pna as well and resolved with short CAP tx course     She is at risk for recurrent bacterial pna, pjp, and hsv/vzv outbreak and will likely benefit from prophylaxis  ---------- 1/19 assessment Patient improving clinically All abx changed to levoflox -- as we had planned to continue about 3 months of bacterial pna prophylaxis in setting heavily immunosuppressing chemo-regimen that is "new" Will also need pjp prophy and will start bactrim to see how her kidney function responses  Her nausea seems to be better   Pulm had performed diagnositc right side thoracentesis 1/18 and cx ngtd; doesn't appear extudative; small post-procedure pneumothorax stable/improving    Plan: continue levofloxacin renally dosed; can change to PO once tolerating po and less nausea. Would continue for the next 3 months till mid 06/2022 Start bactrim 1 ds tablet tiw -- this can be continued indefinitely as long as she is on dexamethasone Monitor  renal/lytes Continue acyclovir prophylaxis  Her oncologist has recommended holding chemo while infection being treated Will check on her again early next week to assess for improvement  Discussed with primary team   I spent 50 minute reviewing data/chart, and coordinating care and >50% direct face to face time providing counseling/discussing diagnostics/treatment plan with patient   Principal Problem:   Multifocal pneumonia with Lung Abscess Active Problems:   Hypothyroidism following radioiodine therapy   Hypothyroidism   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[place])   Multiple myeloma not having achieved remission ([place])   Pancytopenia, acquired -Multiple Myeloma Related   Symptomatic anemia   Thrombocytopenia (HCC)   Lung abscess (HCC)   Paroxysmal atrial fibrillation with RVR (HCC)   Pleural effusion on right   No Known Allergies  Scheduled Meds:  sodium chloride   Intravenous Once   acyclovir  400 mg Oral BID   amiodarone  400 mg Oral BID   dextromethorphan-guaiFENesin  1 tablet Oral BID   levalbuterol  0.63 mg Nebulization BID   levothyroxine  137 mcg Oral QAC breakfast   pantoprazole (PROTONIX) IV  40 mg Intravenous Q12H   sodium chloride flush  3 mL Intravenous Q12H   sodium chloride flush  3 mL Intravenous Q12H   sulfamethoxazole-trimethoprim  1 tablet Oral Once per day on Mon Wed  Fri   Continuous Infusions:  sodium chloride     sodium chloride 75 mL/hr at 03/19/2022 2054   [START ON 03/27/2022] levofloxacin (LEVAQUIN) IV     PRN Meds:.sodium chloride, acetaminophen **OR** acetaminophen, albuterol, bisacodyl, ondansetron **OR** ondansetron (ZOFRAN) IV, polyethylene glycol, polyvinyl alcohol, sodium chloride flush, traMADol, traZODone   SUBJECTIVE: Appetite not quite back Nausea better Afebrile Pancytopenia improving  No other complaint Minimal o2 supplement requirement  Daughter at bedside and thinks she is getting better  Review of Systems: ROS All other ROS was  negative, except mentioned above     OBJECTIVE: Vitals:   03/26/22 0855 03/26/22 0925 03/26/22 0926 03/26/22 1239  BP: 103/60   (!) 96/53  Pulse: (!) 101   99  Resp: (!) 22   (!) 26  Temp:    (!) 97 F (36.1 C)  TempSrc:    Oral  SpO2:  100% 100% 100%  Weight:      Height:       Body mass index is 23.1 kg/m.  Physical Exam General/constitutional: no distress, pleasant; o2 supplement 3 liters via [place] HEENT: Normocephalic, PER, Conj Clear, EOMI, Oropharynx clear Neck supple CV: rrr no mrg Lungs: clear to auscultation, normal respiratory effort Abd: Soft, Nontender Ext: no edema Skin: No Rash Neuro: generalized weakness MSK: no peripheral joint swelling/tenderness/warmth; back spines nontender   Lab Results Lab Results  Component Value Date   WBC 4.1 03/26/2022   HGB 8.3 (L) 03/26/2022   HCT 25.0 (L) 03/26/2022   MCV 96.9 03/26/2022   PLT 104 (L) 03/26/2022    Lab Results  Component Value Date   CREATININE 1.05 (H) 03/26/2022   BUN 21 03/26/2022   NA 135 03/26/2022   K 3.5 03/26/2022   CL 107 03/26/2022   CO2 20 (L) 03/26/2022    Lab Results  Component Value Date   ALT 11 03/26/2022   AST 12 (L) 03/26/2022   ALKPHOS 50 03/26/2022   BILITOT 1.1 03/26/2022      Microbiology: Recent Results (from the past 240 hour(s))  Culture, blood (routine x 2)     Status: None (Preliminary result)   Collection Time: 03/29/2022 11:19 AM   Specimen: BLOOD RIGHT HAND  Result Value Ref Range Status   Specimen Description BLOOD RIGHT HAND  Final   Special Requests   Final    BOTTLES DRAWN AEROBIC ONLY Blood Culture results may not be optimal due to an inadequate volume of blood received in culture bottles   Culture   Final    NO GROWTH 4 DAYS Performed at [facility], [address], [place], [place] [address]    Report Status PENDING  Incomplete  Culture, blood (routine x 2)     Status: None (Preliminary result)   Collection Time: 03/26/2022 12:22 PM   Specimen: BLOOD   Result Value Ref Range Status   Specimen Description BLOOD  Final   Special Requests NONE BLOOD RIGHT ARM  Final   Culture   Final    NO GROWTH 4 DAYS Performed at [facility], [address], [place], [place] [address]    Report Status PENDING  Incomplete  MRSA Next Gen by PCR, Nasal     Status: None   Collection Time: 03/19/2022  2:27 PM   Specimen: Nasal Mucosa; Nasal Swab  Result Value Ref Range Status   MRSA by PCR Next Gen NOT DETECTED NOT DETECTED Final    Comment: (NOTE) The GeneXpert MRSA Assay (FDA approved for NASAL specimens  only), is one component of a comprehensive MRSA colonization surveillance program. It is not intended to diagnose MRSA infection nor to guide or monitor treatment for MRSA infections. Test performance is not FDA approved in patients less than 50 years old. Performed at [facility], [address] [place], [place] [address]   Body fluid culture w Gram Stain     Status: None (Preliminary result)   Collection Time: 04/06/2022  3:07 PM   Specimen: Thoracentesis; Body Fluid  Result Value Ref Range Status   Specimen Description THORACENTESIS  Final   Special Requests NONE  Final   Gram Stain   Final    WBC PRESENT,BOTH PMN AND MONONUCLEAR NO ORGANISMS SEEN CYTOSPIN SMEAR    Culture   Final    NO GROWTH < 24 HOURS Performed at [facility], [place] [address], [place], [place] [address]    Report Status PENDING  Incomplete     Serology:   Imaging: If present, new imagings (plain films, ct scans, and mri) have been personally visualized and interpreted; radiology reports have been reviewed. Decision making incorporated into the Impression / Recommendations.  1/19 cxr 1. Stable small right hydropneumothorax. 2. Stable patchy consolidation throughout the right greater than left lungs, most prominent in the right perihilar lung, compatible with multilobar pneumonia.     1/18 chest xray FINDINGS: The heart size and mediastinal contours are  partially obscured but appear borderline enlarged.   Moderate right and small left pleural effusions. Right middle lobe airspace consolidation. Additional patchy bilateral airspace consolidations.   Prior vertebral body augmentation.   IMPRESSION: Multifocal pneumonia with parapneumonic effusions.     1/15 chest ct without contrast 1. Interval development of multifocal new nodules and consolidation, predominantly involving the right middle lobe, where there is a gas-containing fluid collection measuring 2.2 x 1.6 cm in the base of the right middle lobe. Findings are most consistent with multifocal pneumonia with abscess formation. 2. Near-complete resolution of previously noted left upper lobe consolidation. 3. Moderate right and trace left pleural effusions. 4. Diffusely heterogeneous appearance of the skeleton in keeping with known multiple myeloma. 5. Coronary artery calcifications. Aortic Atherosclerosis     1/15 tte  1. Left ventricular ejection fraction, by estimation, is 55 to 60%. The  left ventricle has normal function. Left ventricular endocardial border  not optimally defined to evaluate regional wall motion. Left ventricular  diastolic function could not be  evaluated.   2. Right ventricular systolic function is normal. The right ventricular  size is mildly enlarged. There is moderately elevated pulmonary artery  systolic pressure. The estimated right ventricular systolic pressure is  09.8 mmHg.   3. Left atrial size was severely dilated.   4. Right atrial size was severely dilated.   5. The mitral valve is abnormal. Mild mitral valve regurgitation. No  evidence of mitral stenosis.   6. Tricuspid valve regurgitation is mild to moderate.   7. The aortic valve was not well visualized. Aortic valve regurgitation  is trivial. No aortic stenosis is present.   8. The inferior vena cava is normal in size with <50% respiratory  variability, suggesting right atrial  pressure of 8 mmHg.   Comparison(s): No prior Echocardiogram.      Prior admission: 09/2021 chest ct -Multifocal upper lobe pneumonia, left greater than right. Associated small left pleural effusion. Consider follow-up chest radiographs in 4-6 weeks to document clearance. -Multiple lytic lesions, compatible with the patient's known multiple myeloma.    [place] T  Gale Journey, [place] for [place] [phone] pager    03/26/2022, 3:34 PM

## 2022-03-26 NOTE — Evaluation (Signed)
Clinical/Bedside Swallow Evaluation Patient Details  Name: QUETZALLY CALLAS MRN: [id number] Date of Birth: 1940-06-21  Today's Date: 03/26/2022 Time: SLP Start Time (ACUTE ONLY): 73 SLP Stop Time (ACUTE ONLY): 1332 SLP Time Calculation (min) (ACUTE ONLY): 9 min  Past Medical History:  Past Medical History:  Diagnosis Date   Atrial fibrillation ([place]) 2011   Postop, spontaneous conversion to normal sinus after one hour   CAP (community acquired pneumonia) 09/23/2021   Cholecystitis, acute 04/25/2015   Compression fracture 07/24/2009   T12; kyphoplasty   History of echocardiogram 07/2009   EF 65%   Hypertension    Thyroid disease    Tobacco abuse    Past Surgical History:  Past Surgical History:  Procedure Laterality Date   BACK SURGERY     BACK SURGERY  06/06/2015   BREAST EXCISIONAL BIOPSY Left    50 years ago  benign   CHOLECYSTECTOMY N/A 04/25/2015   Procedure: LAPAROSCOPIC CHOLECYSTECTOMY WITH INTRAOPERATIVE CHOLANGIOGRAM;  Surgeon: Mickeal Skinner, MD;  Location: [facility];  Service: General;  Laterality: N/A;   COLONOSCOPY N/A 11/26/2015   Procedure: COLONOSCOPY;  Surgeon: Daneil Dolin, MD;  Location: [facility];  Service: Endoscopy;  Laterality: N/A;  7:30 am   ERCP N/A 04/14/2015   Procedure: ENDOSCOPIC RETROGRADE CHOLANGIOPANCREATOGRAPHY (ERCP) Biliary Sphincterotomy, 10x7 stent placement Dilated bilary system just not well seen;  Surgeon: Rogene Houston, MD;  Location: [facility];  Service: Endoscopy;  Laterality: N/A;   ERCP N/A 06/12/2015   Procedure: ENDOSCOPIC RETROGRADE CHOLANGIOPANCREATOGRAPHY (ERCP);  Surgeon: Rogene Houston, MD;  Location: [facility];  Service: Endoscopy;  Laterality: N/A;   ESOPHAGOGASTRODUODENOSCOPY N/A 06/12/2015   Procedure: DIAGNOSTIC ESOPHAGOGASTRODUODENOSCOPY (EGD);  Surgeon: Rogene Houston, MD;  [facility]: [facility];  Service: Endoscopy;  Laterality: N/A;   FEMUR IM NAIL Right 05/11/2019   Procedure: RETROGRADE INTRAMEDULLARY NAIL  FEMORAL;  Surgeon: Shona Needles, MD;  Location: [facility];  Service: Orthopedics;  Laterality: Right;   STENT REMOVAL  06/12/2015   Procedure: STENT REMOVAL ;  Surgeon: Rogene Houston, MD;  [facility]: [facility];  Service: Endoscopy;;   HPI:  82 year old female presented to [facility] on 1/15 with nausea vomiting and poor oral intake for the past 3 weeks.  Dx multifocal pna, iatrogenic right pneumothorax, N/V, regurgitation of POs. PMHx multiple myeloma, paroxysmal atrial fibrillation, COPD, pancytopenia, hypothyroidism. CT of chest abdomen pelvis revealed multifocal pneumonia and possible abscess in the right middle lobe with moderate right and small left pleural effusions.    Assessment / Plan / Recommendation  Clinical Impression  Pt participated in very limited clinical swallowing exam due to her repeatedly declining POs.  Oral mechanism exam was normal excluding the appearance of thrush on her tongue. She accepted a single sip of thin liquids, ice, and a single bite of applesauce with no overt s/s of oral deficits nor pharyngeal deficits. There were no symptoms to suggest prandial aspriation. Her daughter reports regurgitation and vomiting. No SLP needs are identified - she may benefit from esophageal w/u. Our service will sign off. SLP Visit Diagnosis: Dysphagia, unspecified (R13.10)           Diet Recommendation   Defer to MD       Other  Recommendations Recommended Consults: Consider esophageal assessment Oral Care Recommendations: Oral care BID    Recommendations for follow up therapy are one component of a multi-disciplinary discharge planning process, led by the attending physician.  Recommendations may be updated  based on patient status, additional functional criteria and insurance authorization.  Follow up Recommendations No SLP follow up        [facility] Date of Onset: 03/28/2022 HPI: 82 year old female presented to [facility] on 1/15 with  nausea vomiting and poor oral intake for the past 3 weeks.  Dx multifocal pna, iatrogenic right pneumothorax, N/V, regurgitation of POs. PMHx multiple myeloma, paroxysmal atrial fibrillation, COPD, pancytopenia, hypothyroidism. CT of chest abdomen pelvis revealed multifocal pneumonia and possible abscess in the right middle lobe with moderate right and small left pleural effusions. Type of Study: Bedside Swallow Evaluation Previous Swallow Assessment:  (no) Diet Prior to this Study: Regular;Thin liquids Temperature Spikes Noted: No Respiratory Status: Nasal cannula History of Recent Intubation: No Behavior/Cognition: Alert Oral Cavity Assessment: Other (comment) (thrush on tongue) Oral Care Completed by SLP: No Oral Cavity - Dentition: Adequate natural dentition Vision: Functional for self-feeding Self-Feeding Abilities: Able to feed self;Needs assist Patient Positioning: Upright in bed Baseline Vocal Quality: Normal Volitional Cough: Strong Volitional Swallow: Able to elicit    Oral/Motor/Sensory Function Overall Oral Motor/Sensory Function: Within functional limits   Ice Chips Ice chips: Within functional limits   Thin Liquid Thin Liquid: Within functional limits    Nectar Thick Nectar Thick Liquid: Not tested   Honey Thick Honey Thick Liquid: Not tested   Puree Puree: Within functional limits   Solid     Solid: Not tested (declined)      Juan Quam Laurice 03/26/2022,1:49 PM Estill Bamberg L. Tivis Ringer, MA CCC/SLP Clinical Specialist - [place] Office number [phone]

## 2022-03-26 NOTE — Progress Notes (Addendum)
Patient urinated 500 cc , bladder scan showed 284 cc. Patient denied any discomfort or urge to urinate. O2 sat 100% in non-rebreather. Plan of care continues.

## 2022-03-26 NOTE — Progress Notes (Signed)
   NAME:  Courtney Arnold, MRN:  [id number], DOB:  April 14, 1940, LOS: 4 ADMISSION DATE:  03/10/2022, CONSULTATION DATE:  03/22/21 REFERRING MD:  Denton Brick  CHIEF COMPLAINT:  abd pain N and V while on chemo for refractory MM and now with R ML necrotizing pna   History of Present Illness:  82  yowf quit smoking 8 y PTA with  relapsed refractory MM and HBP ? Prior afib  admitted with 3 weeks of gen abd pain N and V with multifocal pna most dense in RML with cavity formation and PCCM service consulted pm 1/15   Pertinent  Medical History  82 y.o. female with multiple myeloma, atrial fibrillation, hypertension and history of cholecystectomy presenting for abdominal pain.  Abdominal pain started about 2 weeks PTA  States she is tender all over.  She has had 3 weeks of nausea and vomiting.  States her appetite has been really poor she is only drinking [facility] but cannot keep it down.  Denies fever or chills.  Denies diarrhea but still has been loose.  Denies hematemesis, hematochezia or melena and hematuria.  Daughter believes it to be related to new medication for her multiple myeloma which she started 6 months ago.  Medication is Pomalyst.  Daughter also mentioned that her skin appears more "yellow".  Taking weekly steroids.  Daughter stated   not currently being treated for atrial fibrillation.   Significant Hospital Events: Including procedures, antibiotic start and stop dates in addition to other pertinent events   MRSA  PCR  1/15 neg  BC x 2  1/15  03/14/2022 right thoracentesis  Interim History / Subjective:  Currently on 3% facemask for 5% pneumothorax  Objective   Blood pressure 103/60, pulse (!) 101, temperature 97.6 F (36.4 C), temperature source Oral, resp. rate (!) 22, height 5' 2.01" (1.575 m), weight 57.3 kg, SpO2 100 %.    FiO2 (%):  [50 %] 50 %   Intake/Output Summary (Last 24 hours) at 03/26/2022 1112 Last data filed at 03/26/2022 0400 Gross per 24 hour  Intake 1071.26 ml  Output  1200 ml  Net -128.74 ml   Filed Weights   03/23/22 1225 04/06/2022 1356  Weight: 57.3 kg 57.3 kg    Examination: General: Frail elderly female on 50% facemask HEENT: MM pink/moist Neuro: Dull affect CV: Heart sounds are regular PULM: Decreased respirations throughout  GI: soft, bsx4 active  GU: Foley catheter with amber urine Extremities: warm/dry, negative edema  Skin: no rashes or lesions     Assessment & Plan:   Multifocal pna with one area suggesting a necrotizing process in the RML ? Lung abscess vs opportunistic infection (immunocompromised host on Chemo for relapsed refractory MM) with necrosis eg nocardia or aspegillus or even mucormycosis.   More likely chronic aspiration given her frailty and esp with recent hx N/V Rt pnx post thora Currently on 50% facemask for less than 5% pneumothorax Mobilize as able Possible chest tube in the future Does seem to be in a steady state of decline.  Consider further palliation and discussion   Urinary retention Now has Foley       Rest per primary team.   Montey Hora, PA - C  Pulmonary & Critical Care Medicine For pager details, please see AMION or use Epic chat  After 1900, please call [facility] for cross coverage needs 03/26/2022, 11:12 AM

## 2022-03-27 ENCOUNTER — Inpatient Hospital Stay (HOSPITAL_COMMUNITY): Payer: Medicare Other

## 2022-03-27 DIAGNOSIS — C9000 Multiple myeloma not having achieved remission: Secondary | ICD-10-CM | POA: Diagnosis not present

## 2022-03-27 DIAGNOSIS — J851 Abscess of lung with pneumonia: Secondary | ICD-10-CM | POA: Diagnosis not present

## 2022-03-27 DIAGNOSIS — J189 Pneumonia, unspecified organism: Secondary | ICD-10-CM | POA: Diagnosis not present

## 2022-03-27 DIAGNOSIS — J90 Pleural effusion, not elsewhere classified: Secondary | ICD-10-CM | POA: Diagnosis not present

## 2022-03-27 DIAGNOSIS — J95811 Postprocedural pneumothorax: Secondary | ICD-10-CM | POA: Diagnosis not present

## 2022-03-27 LAB — CULTURE, BLOOD (ROUTINE X 2)
Culture: NO GROWTH
Culture: NO GROWTH

## 2022-03-27 LAB — BASIC METABOLIC PANEL
Anion gap: 7 (ref 5–15)
BUN: 18 mg/dL (ref 8–23)
CO2: 20 mmol/L — ABNORMAL LOW (ref 22–32)
Calcium: 6.7 mg/dL — ABNORMAL LOW (ref 8.9–10.3)
Chloride: 109 mmol/L (ref 98–111)
Creatinine, Ser: 1.03 mg/dL — ABNORMAL HIGH (ref 0.44–1.00)
GFR, Estimated: 55 mL/min — ABNORMAL LOW (ref >60)
Glucose, Bld: 101 mg/dL — ABNORMAL HIGH (ref 70–99)
Potassium: 3.4 mmol/L — ABNORMAL LOW (ref 3.5–5.1)
Sodium: 136 mmol/L (ref 135–145)

## 2022-03-27 LAB — MAGNESIUM: Magnesium: 1.8 mg/dL (ref 1.7–2.4)

## 2022-03-27 LAB — CBC
HCT: 23.5 % — ABNORMAL LOW (ref 36.0–46.0)
Hemoglobin: 7.7 g/dL — ABNORMAL LOW (ref 12.0–15.0)
MCH: 32.2 pg (ref 26.0–34.0)
MCHC: 32.8 g/dL (ref 30.0–36.0)
MCV: 98.3 fL (ref 80.0–100.0)
Platelets: 100 10*3/uL — ABNORMAL LOW (ref 150–400)
RBC: 2.39 MIL/uL — ABNORMAL LOW (ref 3.87–5.11)
RDW: 20.4 % — ABNORMAL HIGH (ref 11.5–15.5)
WBC: 5.2 10*3/uL (ref 4.0–10.5)
nRBC: 0.6 % — ABNORMAL HIGH (ref 0.0–0.2)

## 2022-03-27 MED ORDER — POTASSIUM CHLORIDE CRYS ER 20 MEQ PO TBCR
40.0000 meq | EXTENDED_RELEASE_TABLET | Freq: Once | ORAL | Status: AC
  Administered 2022-03-27: 40 meq via ORAL
  Filled 2022-03-27: qty 2

## 2022-03-27 MED ORDER — CALCIUM GLUCONATE-NACL 1-0.675 GM/50ML-% IV SOLN
1.0000 g | Freq: Once | INTRAVENOUS | Status: AC
  Administered 2022-03-27: 1000 mg via INTRAVENOUS
  Filled 2022-03-27: qty 50

## 2022-03-27 MED ORDER — DIPHENOXYLATE-ATROPINE 2.5-0.025 MG PO TABS
1.0000 | ORAL_TABLET | Freq: Once | ORAL | Status: AC
  Administered 2022-03-27: 1 via ORAL
  Filled 2022-03-27: qty 1

## 2022-03-27 NOTE — Progress Notes (Addendum)
[facility] Progress Note  Patient: Courtney Arnold     WUJ:[id number]  DOA: 04/02/2022   PCP: Derek Jack, MD       Brief hospital course: This is an 82 year old female with multiple myeloma, paroxysmal atrial fibrillation, COPD, pancytopenia requiring packed red blood cell and platelet transfusions, hypothyroidism who presented to [facility] on 1/15 with nausea vomiting and poor oral intake for the past 3 weeks.  CT of chest abdomen pelvis revealed multifocal pneumonia and possible abscess in the right middle lobe with moderate right and small left pleural effusions. She was transferred to [facility] on 1/16 at the recommendation of pulmonary to obtain consult with pulmonary here. She developed A fib with RVR and was started on an Amio infusion 1/17> PCCM recommends to hold off on Bronch, cont abx, swich IV Amio to oral 1/18> thoracentesis of right pleural space and 200 cc of cloudy fluid obtained-has a small iatrogenic pneumothorax- see details below ID consult> start Levaquin, Discussion with her oncologist- see below 1/19- began to have frequent small stools   Subjective:  She has 5 small soft BMs overnight per RN. The patient says she feels "good" and has no complaints which is her usual answer.  Assessment and Plan: Principal Problem:   Multifocal pneumonia with Lung Abscess and bilateral pleural effusions -CT reveals bilateral multifocal nodules with consolidation in the right middle lobe, right moderate pleural effusion and small left pleural effusion-near complete resolution of previously noted left upper lobe consolidation  - ID has evaluated the patient and recommended IV levofloxacin and when able to tolerate orals, switch to oral levofloxacin (total 2 wks) and add Bactrim 1 tab DS 3 times a week for PJP prophylaxis  Active Problems: Iatrogenic right pneumothorax - Post thoracentesis-  improving   Nausea/ vomiting with poor oral intake -She has had  vomiting for few weeks now and continued to do so in the hospital -Abdomen distended on exam but has been non tender  - She did undergo a CT scan on 1/16 which revealed a surgically absent gallbladder, CBD of 8 mm, small hiatal hernia with circumferential esophageal wall thickening suggestive of GERD or less likely esophageal neoplasm - The patient takes Decadron twice a week -Per family and RN, she vomits/regurgitates just within a few minutes of eating - SLP eval done- no signs of aspiration - not a candidate for EGD at this time due to poor respiratory status - no thrush noted - need to continue Protonix IV BID for possible esophagitis - I and O -105 yesterday after 75 cc/hr of IVF started - still not eating enough to maintain physiological needs  Diarrhea - possibly from antibiotics- abdomen is non tender- Per RN, her perianal area is quite sore not from repeated cleanings- will give a dose of Imodium- do not want to over treat and develop constipation  AKI - cr was 0.8 and is now 1.03- managing fluids to prevent overload  Hypokalemia - expected from her loose stools- I started KCL in NS infusion yesterday - will add once dose of oral KCL 40 meq  Multiple myeloma, relapsed Fatigue related to cancer - Receiving pomalidomide as outpatient with 1/16 to have been the date of her next cycle - Has lesions throughout her skeleton - Receives acyclovir prophylaxis twice a day, Decadron and Ritalin -Her oncologist has recommended to hold Pomalidomide & Decadron and follow-up with him as outpatient to decide on whether or not to resume these  Paroxysmal atrial fibrillation with RVR -  new diagnosis Right pleural effusion Mod pulm HTN- possibility of diastolic dysfunction - [place] Vasc score is 4 - 2 D ECHO> EF 55-60%, unable to evaluate diastolic function, b/l atrial dilation, mild to mod TR, mo elevated pulm pressure, normal IV function with mild RV enlargement - Placed on IV amiodarone and  transitioned to oral amiodarone   -plan for 400 mg twice daily x 10 days followed by 200 mg daily and then outpatient follow-up -Heparin infusion stopped due to pneumothorax and possible need for CT  Pancytopenia - Continue to follow  Hypothyroidism -TSH 5.6-likely sick euthyroid - Continue levothyroxine   Disposition:    I do not feel she has the medical capacity to make her own medical decisions and I have relayed this to her daughters.   Discussion with both daughters [facility]) on phone and [name] in person.  Very poor prognosis as she is still not able to eat and is now having multiple loose stools. This, along with Multiple Myeloma, pancytopenia, New onset A-fib with RVR, lung abscess, transudative pleural effusion (from third spacing and possible diastolic CHF) and pneumothorax suggests that she will have a poor outcome.  Elise Benne, her daughter does not feel her mother can go home as she is unable to take care of her 24 hrs a day and has no one else to care for her 24 hrs a day. She states her mother has been a 2 person assist at home. The daughter did ask for CIR and I suggested private in home sitters. [name] suggested I ask the patient about a Charity fundraiser. The patient state she can live at home alone and does not comprehend the gravity of her condition.          Code Status: DNR Consultants: Cardiology, pulmonary Level of Care: Level of care: Progressive Total time on patient care: 45 minutes DVT prophylaxis: Heparin infusion  Objective:   Vitals:   03/27/22 0437 03/27/22 0809 03/27/22 0810 03/27/22 0937  BP: (!) 115/52 (!) 107/45 (!) 107/45   Pulse: 76 72 72   Resp: 20 (!) 26 20   Temp: 97.8 F (36.6 C) (!) 97.4 F (36.3 C) (!) 97.4 F (36.3 C)   TempSrc: Oral Oral    SpO2: 99% 100%  100%  Weight:      Height:       Filed Weights   03/23/22 1225 04/04/2022 1356  Weight: 57.3 kg 57.3 kg   Exam: General exam: Appears comfortable -  being turned to be  cleaned of bowel movement HEENT: oral mucosa very dry, still has dried blood/scabs on right nostril Respiratory system: b/l crackles, elevated resp rate Cardiovascular system: S1 & S2 heard - IIRR Gastrointestinal system: Abdomen soft, non-tender,mildly distended. Normal bowel sounds   Extremities: No cyanosis, clubbing or edema Psychiatry:  continues to have a flat affect    CBC: Recent Labs  Lab 03/18/2022 0955 03/23/22 0633 03/24/22 0621 03/12/2022 0556 03/26/22 0205 03/27/22 0117  WBC 2.2* 2.4* 3.5* 4.8 4.1 5.2  NEUTROABS 1.2*  --   --   --   --   --   HGB 7.5* 8.4* 8.0* 8.9* 8.3* 7.7*  HCT 23.5* 26.1* 24.6* 26.4* 25.0* 23.5*  MCV 99.6 98.9 97.6 96.4 96.9 98.3  PLT 90* 76* 85* 101* 104* 100*    Basic Metabolic Panel: Recent Labs  Lab 03/20/2022 1222 03/23/22 0500 03/23/22 1194 03/24/22 0621 03/24/2022 0556 03/26/22 0205 03/27/22 0117  NA  --  133* 132*  --  136 135 136  K  --  4.0 4.0  --  3.5 3.5 3.4*  CL  --  102 102  --  107 107 109  CO2  --  19* 19*  --  18* 20* 20*  GLUCOSE  --  111* 118*  --  102* 97 101*  BUN  --  21 20  --  '19 21 18  '$ CREATININE  --  0.82 0.85 0.85 1.04* 1.05* 1.03*  CALCIUM  --  6.7* 6.8*  --  6.7* 6.4* 6.7*  MG 2.0  --   --   --   --   --   --   PHOS  --  2.8  --   --   --   --   --     GFR: Estimated Creatinine Clearance: 34.5 mL/min (A) (by C-G formula based on SCr of 1.03 mg/dL (H)).  Scheduled Meds:  sodium chloride   Intravenous Once   acyclovir  400 mg Oral BID   amiodarone  400 mg Oral BID   dextromethorphan-guaiFENesin  1 tablet Oral BID   levalbuterol  0.63 mg Nebulization BID   [START ON 04/02/2022] levofloxacin  250 mg Oral Daily   levothyroxine  137 mcg Oral QAC breakfast   pantoprazole (PROTONIX) IV  40 mg Intravenous Q12H   sodium chloride flush  3 mL Intravenous Q12H   sodium chloride flush  3 mL Intravenous Q12H   sulfamethoxazole-trimethoprim  1 tablet Oral Once per day on Mon Wed Fri   Continuous Infusions:   sodium chloride     0.9 % NaCl with KCl 20 mEq / L 75 mL/hr at 03/26/22 1807   levofloxacin (LEVAQUIN) IV     Imaging and lab data was personally reviewed DG Abd 1 View  Result Date: 03/26/2022 CLINICAL DATA:  Constipation EXAM: ABDOMEN - 1 VIEW COMPARISON:  Abdominal radiograph dated March 24, 2022 FINDINGS: Prominent gas-filled loops of colon small amount of scattered air seen in the small bowel, overall nonobstructive bowel-gas pattern. No significant stool burden visualized. Mild left basilar atelectasis. No acute osseous abnormality. IMPRESSION: Nonobstructive bowel-gas pattern. Electronically Signed   By: Yetta Glassman M.D.   On: 03/26/2022 17:00   DG CHEST PORT 1 VIEW  Result Date: 03/26/2022 CLINICAL DATA:  [id number] Pneumothorax on right [id number] EXAM: PORTABLE CHEST 1 VIEW COMPARISON:  Chest radiograph from one day prior. FINDINGS: Stable cardiomediastinal silhouette with normal heart size. Small right hydropneumothorax with stable small 5% right apical pneumothorax component and stable small basilar right pleural effusion component. No left pneumothorax. No left pleural effusion. Stable patchy consolidation throughout the right greater than left lungs, most prominent in the right perihilar lung. IMPRESSION: 1. Stable small right hydropneumothorax. 2. Stable patchy consolidation throughout the right greater than left lungs, most prominent in the right perihilar lung, compatible with multilobar pneumonia. Electronically Signed   By: Ilona Sorrel M.D.   On: 03/26/2022 08:19   DG Chest Port 1 View  Result Date: 03/16/2022 CLINICAL DATA:  History of recent thoracentesis, follow-up right pneumothorax. EXAM: PORTABLE CHEST 1 VIEW COMPARISON:  Films from earlier in the same day. FINDINGS: Right-sided pneumothorax is noted slightly increased in the lateral component when compare with the prior exam. The apical component is less well visualized. Persistent airspace opacity in the right lung is noted.  Skin fold is noted over the left lateral chest. Old rib fractures are seen on the left. Cardiac shadow is stable. Changes of prior vertebral augmentation are noted  in the thoracic spine. IMPRESSION: Right-sided pneumothorax is again identified with slight increase in the lateral component. There is approximately 12 mm of excursion laterally. The apical component is less well appreciated. The remainder of the exam is stable from the prior study. Electronically Signed   By: Inez Catalina M.D.   On: 03/29/2022 20:22   DG Chest Port 1 View  Addendum Date: 03/15/2022   ADDENDUM REPORT: 03/31/2022 15:52 ADDENDUM: The original report was by Dr. Van Clines. The following addendum is by Dr. Van Clines: Critical Value/emergent results were called by telephone at the time of interpretation on 03/28/2022 at 3:45 pm to provider Elisabeth Cara SOOD , who verbally acknowledged these results. Electronically Signed   By: Van Clines M.D.   On: 03/13/2022 15:52   Result Date: 03/27/2022 CLINICAL DATA:  Status post right thoracentesis EXAM: PORTABLE CHEST 1 VIEW COMPARISON:  03/13/2022 at 6:11 a.m. FINDINGS: New 5% right pneumothorax. Indistinct airspace opacity in the right lower lobe and right middle lobe persists. Interstitial accentuation noted in both lungs. Mild enlargement of the cardiopericardial silhouette. Atherosclerotic calcification of the aortic arch. IMPRESSION: 1. New 5% right pneumothorax. 2. Indistinct airspace opacity in the right middle lobe and right lower lobe potentially from pneumonia or atelectasis. 3. Mild enlargement of the cardiopericardial silhouette. 4. Interstitial accentuation in both lungs. 5. Atherosclerotic calcification of the aortic arch. Radiology assistant personnel have been notified to put me in telephone contact with the referring physician or the referring physician's clinical representative in order to discuss these findings. Once this communication is established I will  issue an addendum to this report for documentation purposes. Electronically Signed: By: Van Clines M.D. On: 03/19/2022 15:33    LOS: 5 days   Author: Debbe Odea  03/27/2022 9:52 AM  To contact [facility]>   Check the care team in [facility] and look for the attending/consulting [place] provider listed  Log into [email or web address] and use [place]'s universal password   Go to> "[facility]"  and find provider  If you still have difficulty reaching the provider, please page the [facility] (Director on Call) for the Hospitalists listed on amion

## 2022-03-27 NOTE — Evaluation (Signed)
Physical Therapy Evaluation Patient Details Name: Courtney Arnold MRN: [id number] DOB: 01/04/1941 Today's Date: 03/27/2022  History of Present Illness  Pt is an 82 y.o. female admitted 03/20/2022 with abdominal pain, 3 weeks N&V daughter feels due to new meds for MM.  Found to have R&L pleural effusions. Patient underwent R thoracentesis 04/07/2022, had small pneumo.  Other PMH includes relapsed IgA kappa plasma cell myeloma, hypothyroidism, PAF, HTN.  Clinical Impression  Patient presents with decreased mobility due to generalized weakness, decreased activity tolerance, decreased balance, decreased safety awareness and poor activity tolerance.  Currently mod to max A for up at EOB with one sit to stand prior to fatigue.  Daughter present and hopeful for SNF in [place].  She is appropriate for SNF at this time.  PT will continue to follow.        Recommendations for follow up therapy are one component of a multi-disciplinary discharge planning process, led by the attending physician.  Recommendations may be updated based on patient status, additional functional criteria and insurance authorization.  Follow Up Recommendations Skilled nursing-short term rehab (<3 hours/day) Can patient physically be transported by private vehicle: No    Assistance Recommended at Discharge Frequent or constant Supervision/Assistance  Patient can return home with the following  Two people to help with walking and/or transfers;A lot of help with bathing/dressing/bathroom;Direct supervision/assist for medications management;Assist for transportation;Help with stairs or ramp for entrance    Equipment Recommendations None recommended by PT  Recommendations for Other Services       Functional Status Assessment Patient has had a recent decline in their functional status and demonstrates the ability to make significant improvements in function in a reasonable and predictable amount of time.     Precautions /  Restrictions Precautions Precautions: Fall      Mobility  Bed Mobility Overal bed mobility: Needs Assistance Bed Mobility: Supine to Sit, Sit to Supine     Supine to sit: Mod assist, HOB elevated Sit to supine: Max assist, +2 for physical assistance   General bed mobility comments: scooting legs off bed and lifting trunk, slowly with assist to scoot to EOB, then to supine assist for legs onto bed, assist to reposition trunk and with daughter's help scoot up in bed    Transfers Overall transfer level: Needs assistance Equipment used: Rolling walker (2 wheels) Transfers: Sit to/from Stand Sit to Stand: Mod assist           General transfer comment: assist to stand to RW with lifting help    Ambulation/Gait               General Gait Details: attempted side steps toward [facility] but pt moved R foot then sat stating needing to lie back down with fatigue  Stairs            Wheelchair Mobility    Modified Rankin (Stroke Patients Only)       Balance Overall balance assessment: Needs assistance   Sitting balance-Leahy Scale: Fair Sitting balance - Comments: on EOB with feet supported or not with S   Standing balance support: Bilateral upper extremity supported Standing balance-Leahy Scale: Poor Standing balance comment: UE support and outside support needed                             Pertinent Vitals/Pain Pain Assessment Pain Assessment: Faces Faces Pain Scale: Hurts even more Pain Location: generalized Pain Descriptors / Indicators: Grimacing Pain  Intervention(s): Monitored during session    Home Living Family/patient expects to be discharged to:: Skilled nursing facility                        Prior Function               Mobility Comments: daughter reports walking on her own furniture walking till 3 weeks ago, since has had to have a lot of help ADLs Comments: daughters assisting with all at this point, had some help  prior     Hand Dominance   Dominant Hand: Right    Extremity/Trunk Assessment   Upper Extremity Assessment Upper Extremity Assessment: Generalized weakness    Lower Extremity Assessment Lower Extremity Assessment: RLE deficits/detail;LLE deficits/detail RLE Deficits / Details: AROM grossly WFL, strength hip flexion 2+/5, knee extension 4-/5 LLE Deficits / Details: AROM grossly WFL, strength hip flexion 3/5, knee extension 4/5    Cervical / Trunk Assessment Cervical / Trunk Assessment: Kyphotic  Communication   Communication: HOH  Cognition Arousal/Alertness: Awake/alert Behavior During Therapy: Flat affect Overall Cognitive Status: Impaired/Different from baseline Area of Impairment: Orientation, Following commands, Problem solving                 Orientation Level: Disoriented to, Time, Situation     Following Commands: Follows one step commands consistently, Follows one step commands with increased time     Problem Solving: Slow processing, Decreased initiation, Difficulty sequencing, Requires verbal cues, Requires tactile cues          General Comments General comments (skin integrity, edema, etc.): ON O2 @ 3LPM with SpO2 100% despite SOB with activity    Exercises     Assessment/Plan    PT Assessment Patient needs continued PT services  PT Problem List Decreased strength;Decreased balance;Decreased knowledge of precautions;Decreased mobility;Decreased activity tolerance;Decreased safety awareness;Cardiopulmonary status limiting activity       PT Treatment Interventions DME instruction;Functional mobility training;Balance training;Therapeutic activities;Therapeutic exercise;Gait training;Patient/family education    PT Goals (Current goals can be found in the Care Plan section)  Acute Rehab PT Goals Patient Stated Goal: family requesting SNF PT Goal Formulation: With patient/family Time For Goal Achievement: 04/10/22 Potential to Achieve Goals:  Fair    Frequency Min 3X/week     Co-evaluation               AM-PAC PT "6 Clicks" Mobility  Outcome Measure Help needed turning from your back to your side while in a flat bed without using bedrails?: A Lot Help needed moving from lying on your back to sitting on the side of a flat bed without using bedrails?: Total Help needed moving to and from a bed to a chair (including a wheelchair)?: Total Help needed standing up from a chair using your arms (e.g., wheelchair or bedside chair)?: Total Help needed to walk in hospital room?: Total Help needed climbing 3-5 steps with a railing? : Total 6 Click Score: 7    End of Session Equipment Utilized During Treatment: Gait belt Activity Tolerance: Patient limited by fatigue Patient left: in bed;with call bell/phone within reach;with family/visitor present   PT Visit Diagnosis: Muscle weakness (generalized) (M62.81);Other abnormalities of gait and mobility (R26.89)    Time: 0454-0981 PT Time Calculation (min) (ACUTE ONLY): 29 min   Charges:   PT Evaluation $PT Eval Moderate Complexity: 1 Mod PT Treatments $Therapeutic Activity: 8-22 mins        Magda Kiel, PT Acute Rehabilitation Services Office:[phone] 03/27/2022  Reginia Naas 03/27/2022, 5:06 PM

## 2022-03-27 NOTE — Progress Notes (Signed)
NAME:  Courtney Arnold, MRN:  [id number], DOB:  January 21, 1941, LOS: 5 ADMISSION DATE:  03/10/2022, CONSULTATION DATE:  03/22/21 REFERRING MD:  Denton Brick  CHIEF COMPLAINT:  abd pain N and V while on chemo for refractory MM and now with R ML necrotizing pna   History of Present Illness:  81  yowf quit smoking 8 y PTA with  relapsed refractory MM and HBP ? Prior afib  admitted with 3 weeks of gen abd pain N and V with multifocal pna most dense in RML with cavity formation and PCCM service consulted pm 1/15   Pertinent  Medical History  82 y.o. female with multiple myeloma, atrial fibrillation, hypertension and history of cholecystectomy presenting for abdominal pain.  Abdominal pain started about 2 weeks PTA  States she is tender all over.  She has had 3 weeks of nausea and vomiting.  States her appetite has been really poor she is only drinking [facility] but cannot keep it down.  Denies fever or chills.  Denies diarrhea but still has been loose.  Denies hematemesis, hematochezia or melena and hematuria.  Daughter believes it to be related to new medication for her multiple myeloma which she started 6 months ago.  Medication is Pomalyst.  Daughter also mentioned that her skin appears more "yellow".  Taking weekly steroids.  Daughter stated   not currently being treated for atrial fibrillation.   Significant Hospital Events: Including procedures, antibiotic start and stop dates in addition to other pertinent events   MRSA  PCR  1/15 neg  BC x 2  1/15  03/21/2022 right thoracentesis 200cc; post-thora pneumothorax treated conservatively 1/19 smaller pneumo  Interim History / Subjective:  Afebrile overnight, denies complaints. Her daughter at bedside does not think she has significantly improved  Objective   Blood pressure (!) 110/50, pulse 77, temperature (!) 97.1 F (36.2 C), temperature source Oral, resp. rate (!) 24, height 5' 2.01" (1.575 m), weight 57.3 kg, SpO2 100 %.        Intake/Output  Summary (Last 24 hours) at 03/27/2022 1357 Last data filed at 03/27/2022 1300 Gross per 24 hour  Intake 844.96 ml  Output 401 ml  Net 443.96 ml    Filed Weights   03/23/22 1225 03/31/2022 1356  Weight: 57.3 kg 57.3 kg    Examination: General: chronically ill, frail appearing elderly woman lying in bed in NAD HEENT: [place]/AT, eyes anicteric Neuro: fatigued appearing, answering some questions with the same answer, falls asleep quickly. CV: S1S2, RRR PULM:  mild tachypnea, breathing comfortably on RA, rhales on the R, CTA on the left  GI: soft, NT  GU: foley with amber urine Extremities: no cyanosis Skin: warm, dry, no rashes. Bruising.  CXR personally reviewed> minimal residual pneumothorax Pleural fluid> NGTD Blood cx NG, final Cytology from pleural fluid pending  Assessment & Plan:   Multifocal pna, at risk for OI due to immunosuppression from chemo for MM. More likely chronic aspiration given her frailty and esp with recent hx N/V. Rt iatrogenic pneumothorax post thora; improving with conservative management -antibiotics for 14 days -aspiration precautions -pulmonary hygiene -Agree that we can safely wean down oxygen as her pneumothorax is resolving. -Agree that overall her health trajectory is declining. Goals of care discussions and planning for future events is appropriate.  -CXR if worsening respiratory status, but no need for ongoing surveillance films with nearly resolved pneumothorax.    Daughter updated at bedside. PCCM will be available as needed, please call with questions.  Julian Hy, DO 03/27/22 3:56 PM [facility]  For contact information, see Amion. If no response to pager, please call PCCM consult pager. After hours, 7PM- 7AM, please call Elink.

## 2022-03-28 ENCOUNTER — Encounter (HOSPITAL_COMMUNITY): Payer: Self-pay | Admitting: Pulmonary Disease

## 2022-03-28 DIAGNOSIS — J189 Pneumonia, unspecified organism: Secondary | ICD-10-CM | POA: Diagnosis not present

## 2022-03-28 DIAGNOSIS — C9000 Multiple myeloma not having achieved remission: Secondary | ICD-10-CM | POA: Diagnosis not present

## 2022-03-28 LAB — BASIC METABOLIC PANEL
Anion gap: 7 (ref 5–15)
BUN: 13 mg/dL (ref 8–23)
CO2: 19 mmol/L — ABNORMAL LOW (ref 22–32)
Calcium: 6.9 mg/dL — ABNORMAL LOW (ref 8.9–10.3)
Chloride: 110 mmol/L (ref 98–111)
Creatinine, Ser: 0.86 mg/dL (ref 0.44–1.00)
GFR, Estimated: >60 mL/min (ref >60)
Glucose, Bld: 132 mg/dL — ABNORMAL HIGH (ref 70–99)
Potassium: 4.5 mmol/L (ref 3.5–5.1)
Sodium: 136 mmol/L (ref 135–145)

## 2022-03-28 LAB — CBC
HCT: 25.1 % — ABNORMAL LOW (ref 36.0–46.0)
Hemoglobin: 8.1 g/dL — ABNORMAL LOW (ref 12.0–15.0)
MCH: 32.3 pg (ref 26.0–34.0)
MCHC: 32.3 g/dL (ref 30.0–36.0)
MCV: 100 fL (ref 80.0–100.0)
Platelets: 102 10*3/uL — ABNORMAL LOW (ref 150–400)
RBC: 2.51 MIL/uL — ABNORMAL LOW (ref 3.87–5.11)
RDW: 21.1 % — ABNORMAL HIGH (ref 11.5–15.5)
WBC: 4.7 10*3/uL (ref 4.0–10.5)
nRBC: 0.6 % — ABNORMAL HIGH (ref 0.0–0.2)

## 2022-03-28 LAB — HEPARIN LEVEL (UNFRACTIONATED): Heparin Unfractionated: 0.1 IU/mL — ABNORMAL LOW (ref 0.30–0.70)

## 2022-03-28 MED ORDER — HEPARIN (PORCINE) 25000 UT/250ML-% IV SOLN
1100.0000 [IU]/h | INTRAVENOUS | Status: AC
  Administered 2022-03-28: 1000 [IU]/h via INTRAVENOUS
  Administered 2022-03-29: 1100 [IU]/h via INTRAVENOUS
  Filled 2022-03-28 (×2): qty 250

## 2022-03-28 MED ORDER — LOPERAMIDE HCL 2 MG PO CAPS
2.0000 mg | ORAL_CAPSULE | ORAL | Status: DC | PRN
  Administered 2022-03-28: 2 mg via ORAL
  Filled 2022-03-28: qty 1

## 2022-03-28 MED ORDER — POTASSIUM CHLORIDE CRYS ER 20 MEQ PO TBCR: 40.0000 meq | EXTENDED_RELEASE_TABLET | Freq: Once | ORAL | Status: DC

## 2022-03-28 MED ORDER — CHLORHEXIDINE GLUCONATE CLOTH 2 % EX PADS
6.0000 | MEDICATED_PAD | Freq: Every day | CUTANEOUS | Status: DC
  Administered 2022-03-28 – 2022-04-04 (×7): 6 via TOPICAL

## 2022-03-28 MED ORDER — OXYCODONE HCL 5 MG PO TABS
5.0000 mg | ORAL_TABLET | ORAL | Status: DC | PRN
  Administered 2022-03-28 – 2022-04-01 (×2): 5 mg via ORAL
  Filled 2022-03-28 (×2): qty 1

## 2022-03-28 NOTE — Progress Notes (Signed)
[place] for heparin Indication: atrial fibrillation  No Known Allergies  Patient Measurements: Height: 5' 2.01" (157.5 cm) Weight: 57.3 kg (126 lb 5.2 oz) IBW/kg (Calculated) : 50.12 Heparin Dosing Weight: TBW  Vital Signs: Temp: 97.5 F (36.4 C) (01/21 0800) Temp Source: Oral (01/21 0800) BP: 109/58 (01/21 0800) Pulse Rate: 74 (01/21 0800)  Labs: Recent Labs    03/26/22 0205 03/27/22 0117  HGB 8.3* 7.7*  HCT 25.0* 23.5*  PLT 104* 100*  HEPARINUNFRC <0.10*  --   CREATININE 1.05* 1.03*     Estimated Creatinine Clearance: 34.5 mL/min (A) (by C-G formula based on SCr of 1.03 mg/dL (H)).   Assessment: 82 yo female with new onset Afib w/ RVR. PMH includes multiple myeloma on oral chemo, chronic anemia/pancytopenia requiring transfusions. CHADs-VASc = 4. No anticoag PTA. Ordered to start Lovenox '1mg'$ /kg on 1/18 but was discontinued for hgb 7.5 (transfused) - no doses given. Cardiology following. Keep AC unless platelets drop below 50 per cardiology rec's. Heparin was being held with possible plans for chest tube, but pneumothorax is improving and pharmacy consulted resume IV heparin for atrial fibrillation.  Hgb 7.7 trending down today, PLTs 100K stable. Patient was previously therapeutic at 1,000 units/hr with CrCl of 34.1 mL/min, now 34.5 mL/min.   Goal of Therapy:  Heparin level 0.3-0.7 units/ml Monitor platelets by anticoagulation protocol: Yes   Plan:  Resume heparin infusion at 1,000 units/hr Check heparin level in 8 hours Daily heparin level and CBC Watch platelets closely  Eliseo Gum, PharmD PGY1 Pharmacy Resident   03/28/2022  1:15 PM

## 2022-03-28 NOTE — Progress Notes (Addendum)
[facility] Progress Note  Patient: Courtney Arnold     BJY:[id number]  DOA: 04/06/2022   PCP: Derek Jack, MD       Brief hospital course: This is an 82 year old female with multiple myeloma, paroxysmal atrial fibrillation, COPD, pancytopenia requiring packed red blood cell and platelet transfusions, hypothyroidism who presented to [facility] on 1/15 with nausea vomiting and poor oral intake for the past 3 weeks.  CT of chest abdomen pelvis revealed multifocal pneumonia and possible abscess in the right middle lobe with moderate right and small left pleural effusions. She was transferred to [facility] on 1/16 at the recommendation of pulmonary to obtain consult with pulmonary here. She developed A fib with RVR and was started on an Amio infusion 1/17> PCCM recommends to hold off on Bronch, cont abx, swich IV Amio to oral 1/18> thoracentesis of right pleural space and 200 cc of cloudy fluid obtained-has a small iatrogenic pneumothorax- see details below ID consult> start Levaquin, Discussion with her oncologist- see below 1/19- began to have frequent small stools which have continued 1/21- resume Heparin, increase frequency of Imodium   I do not feel the patient has the capacity to make her own medical decisions and I have relayed this to her daughters numerous times.  I have had discussions with both daughters Vivien Rota [facility]) on phone and [name] in person.  Very poor prognosis as she is still not able to eat and is now having multiple loose stools. This, along with Multiple Myeloma, pancytopenia, New onset A-fib with RVR, nausea/ vomiting, pneumonia/lung abscess, transudative pleural effusion (from third spacing and possible diastolic CHF) and pneumothorax suggests that she will have a poor outcome. [name] does not feel her mother can go home as she is unable to take care of her 24 hrs a day and has no one else to care for her 24 hrs a day. She states her mother has been a 2  person assist at home.  The patient persistently states she can live at home alone and does not comprehend the gravity of her condition.  I have spoken with [name] again today and reviewed CT scans from 2021 and current hospital stay to show her the extent of the patient's multiple myeloma.     Subjective:  Report from RN> She had 5 more BM overnight and her gluteal area is getting raw and painful from being cleaned repeatedly. Oral intake remains poor. No respiratory distress.   As always, the patient states she feels good.   Assessment and Plan: Principal Problem:   Multifocal pneumonia with Lung Abscess and bilateral pleural effusions - likely aspiration from vomiting at home but cannot rule out other infections as she is immune compromised -CT reveals bilateral multifocal nodules with consolidation in the right middle lobe, right moderate pleural effusion and small left pleural effusion-near complete resolution of previously noted left upper lobe consolidation  -  ID has evaluated the patient and recommended IV levofloxacin (start 1/20) and when able to tolerate orals, switch to oral levofloxacin (total 2 wks) and add Bactrim 1 tab DS 3 times a week for PJP prophylaxis  Active Problems: Iatrogenic right pneumothorax - Post thoracentesis-  improving   Nausea/ vomiting with poor oral intake -She has had vomiting for few weeks and continued to do so in the hospital -Abdomen distended on exam but has been non tender  - CT scan on 1/16 which revealed a surgically absent gallbladder, CBD of 8 mm, small hiatal hernia with  circumferential esophageal wall thickening suggestive of GERD or less likely esophageal neoplasm - The patient takes Decadron twice a week as outpt - Per family and RN, she vomits/regurgitates just within a few minutes of eating-  - SLP eval done- no signs of aspiration - not a candidate for EGD at this time due to poor respiratory status - no thrush noted - need to  continue Protonix IV BID for possible esophagitis - still is not eating enough to maintain physiological needs but no longer vomiting  Diarrhea - possibly from antibiotics- abdomen is still non tender so less likely it is infectious - Imodium PRN  AKI - Cr was 0.8 and is now 1.03- managing fluids to prevent overload  Hypokalemia -  following and replacing  Multiple myeloma, relapsed Fatigue related to cancer - Receiving pomalidomide as outpatient with 1/16 to have been the date of her next cycle - Has lesions throughout her skeleton - Receives acyclovir prophylaxis twice a day, Decadron and Ritalin -Her oncologist has recommended to hold Pomalidomide & Decadron and follow-up with him as outpatient to decide on whether or not to resume these  Paroxysmal atrial fibrillation with RVR -new diagnosis Right pleural effusion Mod pulm HTN- possibility of diastolic dysfunction - [place] Vasc score is 4 - 2 D ECHO> EF 55-60%, unable to evaluate diastolic function, b/l atrial dilation, mild to mod TR, mo elevated pulm pressure, normal IV function with mild RV enlargement - Placed on IV amiodarone and transitioned to oral amiodarone   - plan for 400 mg twice daily x 10 days (stop date 1/25), then 200 mg daily and then outpatient follow-up - Heparin infusion stopped due to pneumothorax and possible need for CT - resume Heparin today as pneumothorax is improving  Pancytopenia - Continue to follow  Hypothyroidism -TSH 5.6-likely sick euthyroid - Continue levothyroxine      Code Status: DNR Consultants: Cardiology, pulmonary Level of Care: Level of care: Progressive Total time on patient care: 35 minutes DVT prophylaxis: Heparin infusion  Objective:   Vitals:   03/27/22 2106 03/27/22 2324 03/28/22 0316 03/28/22 0800  BP: (!) 99/50 (!) 105/50 95/78 (!) 109/58  Pulse:  74 76 74  Resp:  '20 20 20  '$ Temp:  97.7 F (36.5 C) 98.4 F (36.9 C) (!) 97.5 F (36.4 C)  TempSrc:  Oral Oral Oral   SpO2:  100% 97% 100%  Weight:      Height:       Filed Weights   03/23/22 1225 03/27/2022 1356  Weight: 57.3 kg 57.3 kg   Exam: General exam: Appears comfortable - confused to situation  HEENT: oral mucosa moist Respiratory system: Clear to auscultation- continues to have tachypnea Cardiovascular system: S1 & S2 heard  Gastrointestinal system: Abdomen soft, non-tender, nondistended. Normal bowel sounds   Extremities: No cyanosis, clubbing or edema Psychiatry:  flat affect   CBC: Recent Labs  Lab 04/07/2022 0955 03/23/22 0633 03/24/22 0621 04/02/2022 0556 03/26/22 0205 03/27/22 0117  WBC 2.2* 2.4* 3.5* 4.8 4.1 5.2  NEUTROABS 1.2*  --   --   --   --   --   HGB 7.5* 8.4* 8.0* 8.9* 8.3* 7.7*  HCT 23.5* 26.1* 24.6* 26.4* 25.0* 23.5*  MCV 99.6 98.9 97.6 96.4 96.9 98.3  PLT 90* 76* 85* 101* 104* 100*    Basic Metabolic Panel: Recent Labs  Lab 04/01/2022 1222 03/23/22 0500 03/23/22 3893 03/24/22 0621 03/28/2022 0556 03/26/22 0205 03/27/22 0117 03/27/22 0144  NA  --  133* 132*  --  136 135 136  --   K  --  4.0 4.0  --  3.5 3.5 3.4*  --   CL  --  102 102  --  107 107 109  --   CO2  --  19* 19*  --  18* 20* 20*  --   GLUCOSE  --  111* 118*  --  102* 97 101*  --   BUN  --  21 20  --  '19 21 18  '$ --   CREATININE  --  0.82 0.85 0.85 1.04* 1.05* 1.03*  --   CALCIUM  --  6.7* 6.8*  --  6.7* 6.4* 6.7*  --   MG 2.0  --   --   --   --   --   --  1.8  PHOS  --  2.8  --   --   --   --   --   --     GFR: Estimated Creatinine Clearance: 34.5 mL/min (A) (by C-G formula based on SCr of 1.03 mg/dL (H)).  Scheduled Meds:  sodium chloride   Intravenous Once   acyclovir  400 mg Oral BID   amiodarone  400 mg Oral BID   Chlorhexidine Gluconate Cloth  6 each Topical Daily   dextromethorphan-guaiFENesin  1 tablet Oral BID   [START ON 04/02/2022] levofloxacin  250 mg Oral Daily   levothyroxine  137 mcg Oral QAC breakfast   pantoprazole (PROTONIX) IV  40 mg Intravenous Q12H   sodium chloride  flush  3 mL Intravenous Q12H   sodium chloride flush  3 mL Intravenous Q12H   sulfamethoxazole-trimethoprim  1 tablet Oral Once per day on Mon Wed Fri   Continuous Infusions:  sodium chloride     0.9 % NaCl with KCl 20 mEq / L 75 mL/hr at 03/27/22 2334   levofloxacin (LEVAQUIN) IV 750 mg (03/27/22 2120)   Imaging and lab data was personally reviewed DG Chest Port 1 View  Result Date: 03/27/2022 CLINICAL DATA:  Acute respiratory failure. EXAM: PORTABLE CHEST 1 VIEW COMPARISON:  03/26/2022 FINDINGS: The cardio pericardial silhouette is enlarged. Airspace disease at the bases, right greater than left is again noted with small bilateral pleural effusions. Interval decrease in pleural gas at the right apex although pleural line remains visible. Probable skin fold over the left apex. Bones are diffusely demineralized with multilevel thoracolumbar vertebral augmentation and lower thoracic vertebral fractures evident. IMPRESSION: 1. Interval decrease in pleural gas at the right apex although pleural line remains visible. 2. Bibasilar airspace disease, right greater than left with small bilateral pleural effusions. Electronically Signed   By: Misty Stanley M.D.   On: 03/27/2022 10:29   DG Abd 1 View  Result Date: 03/26/2022 CLINICAL DATA:  Constipation EXAM: ABDOMEN - 1 VIEW COMPARISON:  Abdominal radiograph dated March 24, 2022 FINDINGS: Prominent gas-filled loops of colon small amount of scattered air seen in the small bowel, overall nonobstructive bowel-gas pattern. No significant stool burden visualized. Mild left basilar atelectasis. No acute osseous abnormality. IMPRESSION: Nonobstructive bowel-gas pattern. Electronically Signed   By: Yetta Glassman M.D.   On: 03/26/2022 17:00    LOS: 6 days   Author: Eunice Blase Alexandru Moorer  03/28/2022 12:54 PM  To contact [facility]>   Check the care team in [facility] and look for the attending/consulting [place] provider listed  Log into [email or web address] and use [facility]'s universal password   Go to> "[facility]"  and find provider  If you still have  difficulty reaching the provider, please page the [facility] (Director on Call) for the Hospitalists listed on amion

## 2022-03-28 NOTE — Progress Notes (Signed)
ANTICOAGULATION CONSULT NOTE  Pharmacy Consult for heparin Indication: atrial fibrillation  No Known Allergies  Patient Measurements: Height: 5' 2.01" (157.5 cm) Weight: 57.3 kg (126 lb 5.2 oz) IBW/kg (Calculated) : 50.12 Heparin Dosing Weight: TBW  Vital Signs: Temp: 98.4 F (36.9 C) (01/21 2217) Temp Source: Oral (01/21 2217) BP: 105/50 (01/21 1912) Pulse Rate: 73 (01/21 2217)  Labs: Recent Labs    03/26/22 0205 03/27/22 0117 03/28/22 1257 03/28/22 2131  HGB 8.3* 7.7* 8.1*  --   HCT 25.0* 23.5* 25.1*  --   PLT 104* 100* 102*  --   HEPARINUNFRC <0.10*  --   --  0.10*  CREATININE 1.05* 1.03* 0.86  --      Estimated Creatinine Clearance: 41.3 mL/min (by C-G formula based on SCr of 0.86 mg/dL).   Assessment: 82 yo female with new onset Afib w/ RVR. PMH includes multiple myeloma on oral chemo, chronic anemia/pancytopenia requiring transfusions. CHADs-VASc = 4. No anticoag PTA. Ordered to start Lovenox '1mg'$ /kg on 1/18 but was discontinued for hgb 7.5 (transfused) - no doses given. Cardiology following. Keep AC unless platelets drop below 50 per cardiology rec's. Heparin was being held with possible plans for chest tube, but pneumothorax is improving and pharmacy consulted resume IV heparin for atrial fibrillation.  Hgb 7.7 trending down today, PLTs 100K stable. Patient was previously therapeutic at 1,000 units/hr with CrCl of 34.1 mL/min, now 34.5 mL/min.   PM f/u > heparin level is below goal at 0.1 on 1000 units/hr.  No overt bleeding or complications noted, no known issues with IV infusion.  Goal of Therapy:  Heparin level 0.3-0.7 units/ml Monitor platelets by anticoagulation protocol: Yes   Plan:  Increase IV heparin to 1100 units/hr. Repeat heparin level in 8 hrs. Daily heparin level and CBC.  [place] Crane, Roylene Reason, BCCP Clinical Pharmacist  03/28/2022 10:28 PM   [facility] pharmacy phone numbers are listed on [email or web address]

## 2022-03-29 ENCOUNTER — Inpatient Hospital Stay (HOSPITAL_COMMUNITY): Payer: Medicare Other

## 2022-03-29 DIAGNOSIS — Z87891 Personal history of nicotine dependence: Secondary | ICD-10-CM | POA: Diagnosis not present

## 2022-03-29 DIAGNOSIS — C9002 Multiple myeloma in relapse: Secondary | ICD-10-CM | POA: Diagnosis not present

## 2022-03-29 DIAGNOSIS — M7989 Other specified soft tissue disorders: Secondary | ICD-10-CM

## 2022-03-29 DIAGNOSIS — J189 Pneumonia, unspecified organism: Secondary | ICD-10-CM | POA: Diagnosis not present

## 2022-03-29 DIAGNOSIS — R609 Edema, unspecified: Secondary | ICD-10-CM

## 2022-03-29 LAB — BODY FLUID CULTURE W GRAM STAIN: Culture: NO GROWTH

## 2022-03-29 LAB — CBC
HCT: 24.9 % — ABNORMAL LOW (ref 36.0–46.0)
Hemoglobin: 8 g/dL — ABNORMAL LOW (ref 12.0–15.0)
MCH: 32.3 pg (ref 26.0–34.0)
MCHC: 32.1 g/dL (ref 30.0–36.0)
MCV: 100.4 fL — ABNORMAL HIGH (ref 80.0–100.0)
Platelets: 92 10*3/uL — ABNORMAL LOW (ref 150–400)
RBC: 2.48 MIL/uL — ABNORMAL LOW (ref 3.87–5.11)
RDW: 21.3 % — ABNORMAL HIGH (ref 11.5–15.5)
WBC: 4.4 10*3/uL (ref 4.0–10.5)
nRBC: 0.9 % — ABNORMAL HIGH (ref 0.0–0.2)

## 2022-03-29 LAB — BASIC METABOLIC PANEL
Anion gap: 7 (ref 5–15)
BUN: 13 mg/dL (ref 8–23)
CO2: 20 mmol/L — ABNORMAL LOW (ref 22–32)
Calcium: 6.9 mg/dL — ABNORMAL LOW (ref 8.9–10.3)
Chloride: 110 mmol/L (ref 98–111)
Creatinine, Ser: 0.85 mg/dL (ref 0.44–1.00)
GFR, Estimated: >60 mL/min (ref >60)
Glucose, Bld: 137 mg/dL — ABNORMAL HIGH (ref 70–99)
Potassium: 4.4 mmol/L (ref 3.5–5.1)
Sodium: 137 mmol/L (ref 135–145)

## 2022-03-29 LAB — HEPARIN LEVEL (UNFRACTIONATED): Heparin Unfractionated: 0.47 IU/mL (ref 0.30–0.70)

## 2022-03-29 LAB — CYTOLOGY - NON PAP

## 2022-03-29 MED ORDER — ENOXAPARIN SODIUM 60 MG/0.6ML IJ SOSY
60.0000 mg | PREFILLED_SYRINGE | Freq: Two times a day (BID) | INTRAMUSCULAR | Status: DC
  Administered 2022-03-29: 60 mg via SUBCUTANEOUS
  Filled 2022-03-29: qty 0.6

## 2022-03-29 NOTE — Progress Notes (Signed)
[facility] Progress Note  Patient: Courtney Arnold     LOV:[id number]  DOA: 03/24/2022   PCP: Derek Jack, MD       Brief hospital course: This is an 82 year old female with multiple myeloma, paroxysmal atrial fibrillation, COPD, pancytopenia requiring packed red blood cell and platelet transfusions, hypothyroidism who presented to [facility] on 1/15 with nausea vomiting and poor oral intake for the past 3 weeks.  CT of chest abdomen pelvis revealed multifocal pneumonia and possible abscess in the right middle lobe with moderate right and small left pleural effusions. She was transferred to [facility] on 1/16 at the recommendation of pulmonary to obtain consult with pulmonary here. She developed A fib with RVR and was started on an Amio infusion 1/17> PCCM recommends to hold off on Bronch, cont abx, swich IV Amio to oral 1/18> thoracentesis of right pleural space and 200 cc of cloudy fluid obtained-has a small iatrogenic pneumothorax- see details below ID consult> start Levaquin, Discussion with her oncologist- see below 1/19- began to have frequent small stools which have continued 1/21- resume Heparin, increase frequency of Imodium   Per Dr. Wynelle Cleveland on 1/21: "I do not feel the patient has the capacity to make her own medical decisions and I have relayed this to her daughters numerous times.  I have had discussions with both daughters Vivien Rota [facility]) on phone and [name] in person.  Very poor prognosis as she is still not able to eat and is now having multiple loose stools. This, along with Multiple Myeloma, pancytopenia, New onset A-fib with RVR, nausea/ vomiting, pneumonia/lung abscess, transudative pleural effusion (from third spacing and possible diastolic CHF) and pneumothorax suggests that she will have a poor outcome. [name] does not feel her mother can go home as she is unable to take care of her 24 hrs a day and has no one else to care for her 24 hrs a day. She states her  mother has been a 2 person assist at home.  The patient persistently states she can live at home alone and does not comprehend the gravity of her condition.  I have spoken with [name] again today and reviewed CT scans from 2021 and current hospital stay to show her the extent of the patient's multiple myeloma."    Subjective:  Report from RN> She had 5 more BM overnight and her gluteal area is getting raw and painful from being cleaned repeatedly. Oral intake remains poor. No respiratory distress.   As always, the patient states she feels good.   Assessment and Plan: Principal Problem:   Multifocal pneumonia with Lung Abscess and bilateral pleural effusions - likely aspiration from vomiting at home but cannot rule out other infections as she is immune compromised -CT reveals bilateral multifocal nodules with consolidation in the right middle lobe, right moderate pleural effusion and small left pleural effusion-near complete resolution of previously noted left upper lobe consolidation  -  ID has evaluated the patient and recommended oral levaquin through April of this year add Bactrim 1 tab DS 3 times a week for PJP prophylaxis indefinitely if/while on steroids  Active Problems: Iatrogenic right pneumothorax Hypoxic respiratory failure - Post thoracentesis-  improving  - on 2 liters, wean as able  Nausea/ vomiting with poor oral intake -She has had vomiting for few weeks and continued to do so in the hospital -Abdomen distended on exam but has been non tender  - CT scan on 1/16 which revealed a surgically absent gallbladder, CBD  of 8 mm, small hiatal hernia with circumferential esophageal wall thickening suggestive of GERD or less likely esophageal neoplasm - The patient takes Decadron twice a week as outpt - Per family and RN, she vomits/regurgitates just within a few minutes of eating-  - SLP eval done- no signs of aspiration - not a candidate for EGD at this time due to poor  respiratory status - no thrush noted - continue pantop  Loose stool - possibly from antibiotics- abdomen is still non tender so less likely it is infectious. This morning loose stool but not watery - Imodium PRN  AKI - Cr was 0.8 and is now 1.03- managing fluids to prevent overload  Hypokalemia -  following and replacing  Multiple myeloma, relapsed Fatigue related to cancer - Receiving pomalidomide as outpatient with 1/16 to have been the date of her next cycle - Has lesions throughout her skeleton - Receives acyclovir prophylaxis twice a day, Decadron and Ritalin -Her oncologist has recommended to hold Pomalidomide & Decadron and follow-up with him as outpatient to decide on whether or not to resume these  Paroxysmal atrial fibrillation with RVR -new diagnosis Right pleural effusion Mod pulm HTN- possibility of diastolic dysfunction - [place] Vasc score is 4 - 2 D ECHO> EF 55-60%, unable to evaluate diastolic function, b/l atrial dilation, mild to mod TR, mo elevated pulm pressure, normal IV function with mild RV enlargement - Placed on IV amiodarone and transitioned to oral amiodarone   - plan for 400 mg twice daily x 10 days (stop date 1/25), then 200 mg daily and then outpatient follow-up - Heparin infusion stopped due to pneumothorax and possible need for CT - will convert to 1 mg/kg bid lovenox today, can transition to orals once stable and no further procedures planned  Pancytopenia - Continue to follow  Hypothyroidism -TSH 5.6-likely sick euthyroid - Continue levothyroxine  Urinary retention? RN says foley was placed for retention though don't see documentation of that, will d/c and monitor for retention  Right lower extremity swelling Per daughter this is new - u/s to eval for dvt  Debility PT advising snf, family interested in that. TOC consulted      Code Status: DNR Consultants: Cardiology, pulmonary, palliative Level of Care: Level of care:  Progressive Total time on patient care: 35 minutes DVT prophylaxis: lovenox  Objective:   Vitals:   03/28/22 2351 03/29/22 0300 03/29/22 0824 03/29/22 0859  BP: (!) 98/46  (!) 91/45 (!) 90/46  Pulse: 70 71 68   Resp:   20 16  Temp: 97.9 F (36.6 C) 98.4 F (36.9 C) (!) 97.2 F (36.2 C) (!) 97.5 F (36.4 C)  TempSrc: Oral Oral Axillary Oral  SpO2: 96% 92% 98%   Weight:      Height:       Filed Weights   03/23/22 1225 03/16/2022 1356  Weight: 57.3 kg 57.3 kg   Exam: General exam: Appears comfortable - confused to situation  HEENT: oral mucosa moist Respiratory system: Clear to auscultation- continues to have tachypnea but no distress Cardiovascular system: S1 & S2 heard  Gastrointestinal system: Abdomen soft, non-tender, nondistended. Normal bowel sounds   Extremities: No cyanosis, clubbing or edema Psychiatry:  flat affect   CBC: Recent Labs  Lab 03/24/22 0621 04/06/2022 0556 03/26/22 0205 03/27/22 0117 03/28/22 1257  WBC 3.5* 4.8 4.1 5.2 4.7  HGB 8.0* 8.9* 8.3* 7.7* 8.1*  HCT 24.6* 26.4* 25.0* 23.5* 25.1*  MCV 97.6 96.4 96.9 98.3 100.0  PLT 85*  101* 104* 100* 240*   Basic Metabolic Panel: Recent Labs  Lab 03/10/2022 1222 03/23/22 0500 03/23/22 0500 03/23/22 9735 03/24/22 0621 03/10/2022 0556 03/26/22 0205 03/27/22 0117 03/27/22 0144 03/28/22 1257  NA  --  133*   < > 132*  --  136 135 136  --  136  K  --  4.0   < > 4.0  --  3.5 3.5 3.4*  --  4.5  CL  --  102   < > 102  --  107 107 109  --  110  CO2  --  19*   < > 19*  --  18* 20* 20*  --  19*  GLUCOSE  --  111*   < > 118*  --  102* 97 101*  --  132*  BUN  --  21   < > 20  --  '19 21 18  '$ --  13  CREATININE  --  0.82   < > 0.85 0.85 1.04* 1.05* 1.03*  --  0.86  CALCIUM  --  6.7*   < > 6.8*  --  6.7* 6.4* 6.7*  --  6.9*  MG 2.0  --   --   --   --   --   --   --  1.8  --   PHOS  --  2.8  --   --   --   --   --   --   --   --    < > = values in this interval not displayed.   GFR: Estimated Creatinine  Clearance: 41.3 mL/min (by C-G formula based on SCr of 0.86 mg/dL).  Scheduled Meds:  acyclovir  400 mg Oral BID   amiodarone  400 mg Oral BID   Chlorhexidine Gluconate Cloth  6 each Topical Daily   dextromethorphan-guaiFENesin  1 tablet Oral BID   [START ON 04/02/2022] levofloxacin  250 mg Oral Daily   levothyroxine  137 mcg Oral QAC breakfast   pantoprazole (PROTONIX) IV  40 mg Intravenous Q12H   sodium chloride flush  3 mL Intravenous Q12H   sodium chloride flush  3 mL Intravenous Q12H   sulfamethoxazole-trimethoprim  1 tablet Oral Once per day on Mon Wed Fri   Continuous Infusions:  sodium chloride     heparin 1,100 Units/hr (03/28/22 2227)   levofloxacin (LEVAQUIN) IV 750 mg (03/27/22 2120)   Imaging and lab data was personally reviewed No results found.  LOS: 7 days   Author: Desma Maxim  03/29/2022 12:18 PM  To contact [facility]>   Check the care team in [facility] and look for the attending/consulting [place] provider listed  Log into [email or web address] and use [place]'s universal password   Go to> "[facility]"  and find provider  If you still have difficulty reaching the provider, please page the [facility] (Director on Call) for the Hospitalists listed on amion

## 2022-03-29 NOTE — Progress Notes (Signed)
ANTICOAGULATION CONSULT NOTE - Follow Up Consult  Pharmacy Consult for Heparin Indication: atrial fibrillation  No Known Allergies  Patient Measurements: Height: 5' 2.01" (157.5 cm) Weight: 57.3 kg (126 lb 5.2 oz) IBW/kg (Calculated) : 50.12 Heparin Dosing Weight: 57.3 kg  Vital Signs: Temp: 98.1 F (36.7 C) (01/22 1200) Temp Source: Oral (01/22 1200) BP: 91/47 (01/22 1225) Pulse Rate: 78 (01/22 1200)  Labs: Recent Labs    03/27/22 0117 03/28/22 1257 03/28/22 2131 03/29/22 1140  HGB 7.7* 8.1*  --  8.0*  HCT 23.5* 25.1*  --  24.9*  PLT 100* 102*  --  92*  HEPARINUNFRC  --   --  0.10* 0.47  CREATININE 1.03* 0.86  --  0.85    Estimated Creatinine Clearance: 41.8 mL/min (by C-G formula based on SCr of 0.85 mg/dL).  Assessment: 82 yo female with new onset Afib w/ RVR. PMH includes multiple myeloma on oral chemo, chronic anemia/pancytopenia requiring transfusions. CHADs-VASc = 4. No anticoag PTA. Ordered to start Lovenox '1mg'$ /kg on 1/18 but was discontinued for hgb 7.5 (transfused) - no doses given. Cardiology following. Keep AC unless platelets drop below 50 per cardiology rec's. Heparin was being held with possible plans for chest tube, but pneumothorax is improving and pharmacy consulted resume IV heparin for atrial fibrillation.   Difficulty obtaining blood samples this morning. Heparin level is therapeutic (0.47) on 1000 units/hr. Hgb low stable, platelet count with some decrease to 92K, has been low. No bleeding noted.  Goal of Therapy:  Heparin level 0.3-0.7 units/ml Monitor platelets by anticoagulation protocol: Yes   Plan:  Heparin drip is transitioning to Lovenox ~1 mg/kg SQ this pm per MD. Will monitor CBC with you. Noted plan for oral anticoagulation when able and no further procedures planned.  Arty Baumgartner, RPh 03/29/2022,1:39 PM

## 2022-03-29 NOTE — Progress Notes (Signed)
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 Nodules are sub-cm without halo/reverse halo sign. I suspect this is community acquired pna rather than exotic organism such as pjp/nocardia. MM without stem cell transplant are usually not a host for invasive mold infection.    There is some improvement with cough/abd pain, but too early to tell   This is her second pneumonia. 09/2021 had pna as well and resolved with short CAP tx course     She is at risk for recurrent bacterial pna, pjp, and hsv/vzv outbreak and will likely benefit from prophylaxis  ---------- 1/20 assessment Patient nausea is improving No cough Still very poor intake if at all Diarrhea/loose stools >4 times a day  We had planned on 3 months prophylaxis with levoflox and indefinite bactrim pjp prophylaxis while she is on dexamethasone, however, I am concerned with side effect and benefit given overall clinicaly progression of MM. Will plan 10 day pna treatment with levoflox and stop all abx and await goal of care discussion/see how she does off it  Pulm following for pneumothorax. 1/18 diagnostic thoracentesis transudative   Plan: Finish 10 days of pna tx on 1/25 (this gives her 7 day atypical coverage with levoflox) Stop bactrim today Continue acyclovir Await goals of care discussion If her side effect  (nausea/diarrhe) resolves, and she wants to continue pushing on with chemotherapy, we'll consider restarting her antibiotics prophylaxis Advise family to call her oncologist and discuss case as well Discussed with primary team  I spent 50 minute reviewing data/chart, and coordinating care and >50% direct face to face time providing counseling/discussing diagnostics/treatment plan with patient   Principal Problem:   Multifocal pneumonia with Lung Abscess Active Problems:   Hypothyroidism following radioiodine therapy   Hypothyroidism   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[place])   Multiple myeloma not having achieved remission ([place])   Pancytopenia, acquired -Multiple Myeloma Related   Symptomatic anemia   Thrombocytopenia (HCC)   Lung abscess (HCC)   Paroxysmal atrial fibrillation with RVR (HCC)   Pleural effusion on right   Postprocedural pneumothorax   No Known Allergies  Scheduled Meds:  acyclovir  400 mg Oral BID   amiodarone  400 mg Oral BID   Chlorhexidine Gluconate Cloth  6 each Topical Daily   dextromethorphan-guaiFENesin  1 tablet Oral BID   enoxaparin (LOVENOX) injection  1 mg/kg Subcutaneous Q12H   [START ON 04/02/2022] levofloxacin  250 mg Oral Daily   levothyroxine  137 mcg Oral QAC breakfast   pantoprazole (PROTONIX) IV  40 mg Intravenous Q12H   sodium chloride flush  3 mL Intravenous Q12H  sodium chloride flush  3 mL Intravenous Q12H   sulfamethoxazole-trimethoprim  1 tablet Oral Once per day on Mon Wed Fri   Continuous Infusions:  sodium chloride     heparin 1,100 Units/hr (03/29/22 1235)   levofloxacin (LEVAQUIN) IV 750 mg (03/27/22 2120)   PRN Meds:.sodium chloride, acetaminophen **OR** acetaminophen, albuterol, bisacodyl, loperamide, ondansetron **OR** ondansetron (ZOFRAN) IV, oxyCODONE, polyethylene glycol, polyvinyl alcohol, sodium chloride flush, traZODone   SUBJECTIVE: Continue to have poor appetite and debilitated Soft stool/loose stool/diarrhea 4-6 times a day.  Raw perianal area. Immodium prn added by primary team  High 90% o2 sat on 2 liters [place] o2  No other complaint Goals of care discussion with palliative care pending    Review of Systems: ROS All other ROS was negative, except mentioned above     OBJECTIVE: Vitals:   03/29/22 0824 03/29/22 0859 03/29/22 1200 03/29/22 1225  BP: (!) 91/45 (!) 90/46 (!) 91/47 (!) 91/47  Pulse: 68  78   Resp: 20 16 (!) 21 20  Temp: (!) 97.2 F (36.2 C) (!) 97.5 F (36.4 C) 98.1 F (36.7 C)   TempSrc: Axillary Oral Oral   SpO2: 98%  99%   Weight:      Height:       Body mass index is 23.1 kg/m.  Physical Exam General/constitutional: no distress, pleasant, conversant; appears more tired today; 2liters o2 HEENT: Normocephalic, PER, Conj Clear, EOMI, Oropharynx clear Neck supple CV: rrr no mrg Lungs: clear to auscultation, normal respiratory effort Abd: Soft, Nontender Ext: no edema Skin: No Rash Neuro: nonfocal MSK: no peripheral joint swelling/tenderness/warmth; back spines nontender       Lab Results Lab Results  Component Value Date   WBC 4.4 03/29/2022   HGB 8.0 (L) 03/29/2022   HCT 24.9 (L) 03/29/2022   MCV 100.4 (H) 03/29/2022   PLT 92 (L) 03/29/2022    Lab Results  Component Value Date   CREATININE 0.86 03/28/2022   BUN 13 03/28/2022   NA 136 03/28/2022   K 4.5 03/28/2022   CL 110 03/28/2022   CO2 19 (L) 03/28/2022    Lab Results  Component Value Date   ALT 11 03/26/2022   AST 12 (L) 03/26/2022   ALKPHOS 50 03/26/2022   BILITOT 1.1 03/26/2022      Microbiology: Recent Results (from the past 240 hour(s))  Culture, blood (routine x 2)     Status: None   Collection Time: 03/20/2022 11:19 AM   Specimen: BLOOD RIGHT HAND  Result Value Ref Range Status   Specimen Description BLOOD RIGHT HAND  Final   Special Requests   Final    BOTTLES DRAWN AEROBIC ONLY Blood Culture results may not be optimal due to an inadequate volume of blood received in culture bottles    Culture   Final    NO GROWTH 5 DAYS Performed at [facility], [address] [place], [place] [address]    Report Status 03/27/2022 FINAL  Final  Culture, blood (routine x 2)     Status: None   Collection Time: 03/12/2022 12:22 PM   Specimen: BLOOD  Result Value Ref Range Status   Specimen Description BLOOD  Final   Special Requests NONE BLOOD RIGHT ARM  Final   Culture   Final    NO GROWTH 5 DAYS Performed at [facility], [address] [place], [place] [address]    Report Status 03/27/2022 FINAL  Final  MRSA Next Gen by PCR, Nasal  Status: None   Collection Time: 03/31/2022  2:27 PM   Specimen: Nasal Mucosa; Nasal Swab  Result Value Ref Range Status   MRSA by PCR Next Gen NOT DETECTED NOT DETECTED Final    Comment: (NOTE) The GeneXpert MRSA Assay (FDA approved for NASAL specimens only), is one component of a comprehensive MRSA colonization surveillance program. It is not intended to diagnose MRSA infection nor to guide or monitor treatment for MRSA infections. Test performance is not FDA approved in patients less than 22 years old. Performed at [facility], [address], [place], [place] [address]   Body fluid culture w Gram Stain     Status: None   Collection Time: 03/10/2022  3:07 PM   Specimen: Thoracentesis; Body Fluid  Result Value Ref Range Status   Specimen Description THORACENTESIS  Final   Special Requests NONE  Final   Gram Stain   Final    WBC PRESENT,BOTH PMN AND MONONUCLEAR NO ORGANISMS SEEN CYTOSPIN SMEAR    Culture   Final    NO GROWTH 3 DAYS Performed at [facility], [address] [place], [place] [address]    Report Status 03/29/2022 FINAL  Final     Serology:   Imaging: If present, new imagings (plain films, ct scans, and mri) have been personally visualized and interpreted; radiology reports have been reviewed. Decision making incorporated into the Impression / Recommendations.  1/19 cxr 1. Stable small right  hydropneumothorax. 2. Stable patchy consolidation throughout the right greater than left lungs, most prominent in the right perihilar lung, compatible with multilobar pneumonia.     1/18 chest xray FINDINGS: The heart size and mediastinal contours are partially obscured but appear borderline enlarged.   Moderate right and small left pleural effusions. Right middle lobe airspace consolidation. Additional patchy bilateral airspace consolidations.   Prior vertebral body augmentation.   IMPRESSION: Multifocal pneumonia with parapneumonic effusions.     1/15 chest ct without contrast 1. Interval development of multifocal new nodules and consolidation, predominantly involving the right middle lobe, where there is a gas-containing fluid collection measuring 2.2 x 1.6 cm in the base of the right middle lobe. Findings are most consistent with multifocal pneumonia with abscess formation. 2. Near-complete resolution of previously noted left upper lobe consolidation. 3. Moderate right and trace left pleural effusions. 4. Diffusely heterogeneous appearance of the skeleton in keeping with known multiple myeloma. 5. Coronary artery calcifications. Aortic Atherosclerosis     1/15 tte  1. Left ventricular ejection fraction, by estimation, is 55 to 60%. The  left ventricle has normal function. Left ventricular endocardial border  not optimally defined to evaluate regional wall motion. Left ventricular  diastolic function could not be  evaluated.   2. Right ventricular systolic function is normal. The right ventricular  size is mildly enlarged. There is moderately elevated pulmonary artery  systolic pressure. The estimated right ventricular systolic pressure is  62.3 mmHg.   3. Left atrial size was severely dilated.   4. Right atrial size was severely dilated.   5. The mitral valve is abnormal. Mild mitral valve regurgitation. No  evidence of mitral stenosis.   6. Tricuspid valve  regurgitation is mild to moderate.   7. The aortic valve was not well visualized. Aortic valve regurgitation  is trivial. No aortic stenosis is present.   8. The inferior vena cava is normal in size with <50% respiratory  variability, suggesting right atrial pressure of 8 mmHg.   Comparison(s): No prior Echocardiogram.  Prior admission: 09/2021 chest ct -Multifocal upper lobe pneumonia, left greater than right. Associated small left pleural effusion. Consider follow-up chest radiographs in 4-6 weeks to document clearance. -Multiple lytic lesions, compatible with the patient's known multiple myeloma.    Jabier Mutton, [facility] [place] [phone] pager    03/29/2022, 12:42 PM

## 2022-03-29 NOTE — TOC Initial Note (Signed)
Transition of Care [facility]) - Initial/Assessment Note    Patient Details  Name: Courtney Arnold MRN: [id number] Date of Birth: 12-14-1940  Transition of Care [facility]) CM/SW Contact:    Vinie Sill, LCSW Phone Number: 03/29/2022, 11:51 AM  Clinical Narrative:                   CSW spoke with patient's daughter, Courtney Arnold- she requested CSW contact patient's other daughter Courtney Arnold, advised she is the patient's POA.  CSW spoke with Courtney Arnold- CSW introduced self and explained role. she reports she is the patient POA. She states the patient lives home alone. CSW informed of therpy recommendation of short term rehab at [facility].  Per Courtney Arnold, family is meeting today with Palliative Care. She is agreeable to CSW sending out SNF referrals as the family explore their  options. Patient has been to [facility] but no preferred SNF at this time.   CSW will provide bed offer once available.   Thurmond Butts, MSW, LCSW Clinical Social Worker      Barriers to Discharge: Continued Medical Work up   Patient Goals and CMS Choice            Expected Discharge Plan and Services In-house Referral: Clinical Social Work                                            Prior Living Arrangements/Services   Lives with:: Self Patient language and need for interpreter reviewed:: No        Need for Family Participation in Patient Care: Yes (Comment) Care giver support system in place?: Yes (comment)   Criminal Activity/Legal Involvement Pertinent to Current Situation/Hospitalization: No - Comment as needed  Activities of Daily Living      Permission Sought/Granted Permission sought to share information with : Family Supports    Share Information with NAME: [name])  Permission granted to share info w AGENCY: SNFs  Permission granted to share info w Relationship: daughter  Permission granted to share info w Contact Information: [phone]  Emotional Assessment       Orientation: :  Oriented to Place, Oriented to Self, Oriented to Situation Alcohol / Substance Use: Not Applicable Psych Involvement: No (comment)  Admission diagnosis:  Lung abscess ([place]) [J85.2] Atrial fibrillation with rapid ventricular response ([place]) [I48.91] Abscess of middle lobe of right lung with pneumonia ([place]) [J85.1] Patient Active Problem List   Diagnosis Date Noted   Postprocedural pneumothorax 03/27/2022   Pleural effusion on right 04/02/2022   Lung abscess ([place]) 03/31/2022   Multifocal pneumonia with Lung Abscess 04/07/2022   Paroxysmal atrial fibrillation with RVR ([place]) 03/24/2022   DNR (do not resuscitate)/DNI(Do Not Intubate) 01/13/2022   Epistaxis 09/27/2021   Acute blood loss anemia 09/27/2021   Thrombocytopenia ([place]) 09/24/2021   Symptomatic anemia 09/22/2021   AKI (acute kidney injury) ([place]) 09/22/2021   Pulmonary infiltrates 09/22/2021   Pancytopenia, acquired -Multiple Myeloma Related 09/17/2020   Multiple myeloma not having achieved remission ([place]) 06/07/2019   Goals of care, counseling/discussion 06/07/2019   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[place]) 05/31/2019   Vitamin B 12 deficiency 05/22/2019   Chronic anxiety 05/22/2019   Closed fracture of right femur ([place]) 05/10/2019   Hypothyroidism 05/10/2019   Unspecified atrial fibrillation ([place]) 05/10/2019   Anxiety 05/10/2019   Anemia, unspecified 05/10/2019   Sacral lytic lesions 05/10/2019   Bone lesion  05/08/2019   Osteoarthritis of knee 04/06/2019   Common bile duct dilation 09/01/2017   Lower back pain 09/01/2017   Vitamin D deficiency 02/03/2016   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   History of colonic polyps 11/06/2015   Thyroid-related proptosis 06/25/2015   History of biliary stent insertion 04/21/2015   AP (abdominal pain)    Compression fracture of L2 lumbar vertebra (HCC)    Abdominal pain 04/12/2015   TOBACCO ABUSE 08/21/2009   ATRIAL FIBRILLATION, HX OF 08/21/2009   Tobacco abuse 08/21/2009    PCP:  Derek Jack, MD Pharmacy:   [facility] [place] [address] [place] [address] Phone: [phone] Fax: [phone]  [place], [place] - [address] [place] [address] Phone: [phone] Fax: [phone]  [place] [address] Phone: [phone] Fax: [phone]     Social Determinants of Health (SDOH) Social History: [place]: No Food Insecurity (01/14/2022)  Housing: Low Risk  (01/14/2022)  Transportation Needs: No Transportation Needs (01/14/2022)  Utilities: Not At Risk (01/14/2022)  Alcohol Screen: Low Risk  (02/06/2020)  Depression (PHQ2-9): Low Risk  (12/28/2019)  Financial Resource Strain: Low Risk  (05/08/2019)  Physical Activity: Inactive (05/08/2019)  Social Connections: Moderately Isolated (02/06/2020)  Stress: No Stress Concern Present (05/08/2019)  Tobacco Use: Medium Risk (03/28/2022)   SDOH Interventions:     Readmission Risk Interventions     No data to display

## 2022-03-29 NOTE — Progress Notes (Signed)
ABI has been completed.   Preliminary results in CV Proc.   Courtney Arnold 03/29/2022 3:19 PM

## 2022-03-30 ENCOUNTER — Inpatient Hospital Stay (HOSPITAL_COMMUNITY): Payer: Medicare Other | Admitting: Certified Registered"

## 2022-03-30 ENCOUNTER — Encounter (HOSPITAL_COMMUNITY): Admission: EM | Disposition: E | Payer: Self-pay | Source: Home / Self Care | Attending: Internal Medicine

## 2022-03-30 ENCOUNTER — Encounter (HOSPITAL_COMMUNITY): Payer: Self-pay | Admitting: Family Medicine

## 2022-03-30 DIAGNOSIS — K319 Disease of stomach and duodenum, unspecified: Secondary | ICD-10-CM | POA: Diagnosis not present

## 2022-03-30 DIAGNOSIS — I10 Essential (primary) hypertension: Secondary | ICD-10-CM | POA: Diagnosis not present

## 2022-03-30 DIAGNOSIS — Z87891 Personal history of nicotine dependence: Secondary | ICD-10-CM | POA: Diagnosis not present

## 2022-03-30 DIAGNOSIS — R112 Nausea with vomiting, unspecified: Secondary | ICD-10-CM | POA: Diagnosis not present

## 2022-03-30 DIAGNOSIS — K920 Hematemesis: Secondary | ICD-10-CM

## 2022-03-30 DIAGNOSIS — I4891 Unspecified atrial fibrillation: Secondary | ICD-10-CM | POA: Diagnosis not present

## 2022-03-30 DIAGNOSIS — E039 Hypothyroidism, unspecified: Secondary | ICD-10-CM | POA: Diagnosis not present

## 2022-03-30 DIAGNOSIS — J189 Pneumonia, unspecified organism: Secondary | ICD-10-CM | POA: Diagnosis not present

## 2022-03-30 DIAGNOSIS — D638 Anemia in other chronic diseases classified elsewhere: Secondary | ICD-10-CM

## 2022-03-30 HISTORY — PX: ESOPHAGOGASTRODUODENOSCOPY (EGD) WITH PROPOFOL: SHX5813

## 2022-03-30 HISTORY — PX: BIOPSY: SHX5522

## 2022-03-30 LAB — BASIC METABOLIC PANEL WITH GFR
Anion gap: 7 (ref 5–15)
BUN: 13 mg/dL (ref 8–23)
CO2: 20 mmol/L — ABNORMAL LOW (ref 22–32)
Calcium: 6.9 mg/dL — ABNORMAL LOW (ref 8.9–10.3)
Chloride: 110 mmol/L (ref 98–111)
Creatinine, Ser: 0.92 mg/dL (ref 0.44–1.00)
GFR, Estimated: >60 mL/min (ref >60)
Glucose, Bld: 97 mg/dL (ref 70–99)
Potassium: 4.3 mmol/L (ref 3.5–5.1)
Sodium: 137 mmol/L (ref 135–145)

## 2022-03-30 LAB — CBC
HCT: 23.9 % — ABNORMAL LOW (ref 36.0–46.0)
Hemoglobin: 7.7 g/dL — ABNORMAL LOW (ref 12.0–15.0)
MCH: 33.3 pg (ref 26.0–34.0)
MCHC: 32.2 g/dL (ref 30.0–36.0)
MCV: 103.5 fL — ABNORMAL HIGH (ref 80.0–100.0)
Platelets: 82 K/uL — ABNORMAL LOW (ref 150–400)
RBC: 2.31 MIL/uL — ABNORMAL LOW (ref 3.87–5.11)
RDW: 21.9 % — ABNORMAL HIGH (ref 11.5–15.5)
WBC: 3.5 K/uL — ABNORMAL LOW (ref 4.0–10.5)
nRBC: 1.1 % — ABNORMAL HIGH (ref 0.0–0.2)

## 2022-03-30 LAB — HEPARIN LEVEL (UNFRACTIONATED): Heparin Unfractionated: 0.36 IU/mL (ref 0.30–0.70)

## 2022-03-30 LAB — OCCULT BLOOD X 1 CARD TO LAB, STOOL: Fecal Occult Bld: POSITIVE — AB

## 2022-03-30 LAB — HEMOGLOBIN AND HEMATOCRIT, BLOOD
HCT: 25.6 % — ABNORMAL LOW (ref 36.0–46.0)
Hemoglobin: 7.7 g/dL — ABNORMAL LOW (ref 12.0–15.0)

## 2022-03-30 LAB — APTT: aPTT: 39 s — ABNORMAL HIGH (ref 24–36)

## 2022-03-30 SURGERY — ESOPHAGOGASTRODUODENOSCOPY (EGD) WITH PROPOFOL
Anesthesia: Monitor Anesthesia Care

## 2022-03-30 MED ORDER — PHENYLEPHRINE HCL-NACL 20-0.9 MG/250ML-% IV SOLN
INTRAVENOUS | Status: DC | PRN
  Administered 2022-03-30: 50 ug/min via INTRAVENOUS

## 2022-03-30 MED ORDER — PROTAMINE SULFATE 10 MG/ML IV SOLN
50.0000 mg | Freq: Once | INTRAVENOUS | Status: AC
  Administered 2022-03-30: 50 mg via INTRAVENOUS
  Filled 2022-03-30: qty 5

## 2022-03-30 MED ORDER — PHENYLEPHRINE 80 MCG/ML (10ML) SYRINGE FOR IV PUSH (FOR BLOOD PRESSURE SUPPORT)
PREFILLED_SYRINGE | INTRAVENOUS | Status: DC | PRN
  Administered 2022-03-30 (×4): 160 ug via INTRAVENOUS

## 2022-03-30 MED ORDER — DEXAMETHASONE SODIUM PHOSPHATE 10 MG/ML IJ SOLN
INTRAMUSCULAR | Status: DC | PRN
  Administered 2022-03-30: 5 mg via INTRAVENOUS

## 2022-03-30 MED ORDER — ONDANSETRON HCL 4 MG/2ML IJ SOLN
INTRAMUSCULAR | Status: DC | PRN
  Administered 2022-03-30: 4 mg via INTRAVENOUS

## 2022-03-30 MED ORDER — PANTOPRAZOLE INFUSION (NEW) - SIMPLE MED
8.0000 mg/h | INTRAVENOUS | Status: DC
  Administered 2022-03-30 (×2): 8 mg/h via INTRAVENOUS
  Filled 2022-03-30 (×3): qty 100

## 2022-03-30 MED ORDER — PROPOFOL 10 MG/ML IV BOLUS
INTRAVENOUS | Status: DC | PRN
  Administered 2022-03-30: 20 mg via INTRAVENOUS
  Administered 2022-03-30: 40 mg via INTRAVENOUS

## 2022-03-30 MED ORDER — PANTOPRAZOLE SODIUM 40 MG IV SOLR
40.0000 mg | Freq: Once | INTRAVENOUS | Status: AC
  Administered 2022-03-30: 40 mg via INTRAVENOUS
  Filled 2022-03-30: qty 10

## 2022-03-30 MED ORDER — SODIUM CHLORIDE 0.9 % IV BOLUS
500.0000 mL | Freq: Once | INTRAVENOUS | Status: AC
  Administered 2022-03-30: 500 mL via INTRAVENOUS

## 2022-03-30 MED ORDER — LACTATED RINGERS IV SOLN
INTRAVENOUS | Status: AC | PRN
  Administered 2022-03-30: 20 mL/h via INTRAVENOUS

## 2022-03-30 MED ORDER — OXYMETAZOLINE HCL 0.05 % NA SOLN
1.0000 | Freq: Two times a day (BID) | NASAL | Status: DC
  Administered 2022-03-30 – 2022-04-02 (×5): 1 via NASAL
  Filled 2022-03-30: qty 30

## 2022-03-30 MED ORDER — SUCCINYLCHOLINE CHLORIDE 200 MG/10ML IV SOSY
PREFILLED_SYRINGE | INTRAVENOUS | Status: DC | PRN
  Administered 2022-03-30: 80 mg via INTRAVENOUS

## 2022-03-30 MED ORDER — PANTOPRAZOLE SODIUM 40 MG IV SOLR: 40.0000 mg | Freq: Two times a day (BID) | INTRAVENOUS | Status: DC

## 2022-03-30 SURGICAL SUPPLY — 15 items

## 2022-03-30 NOTE — Plan of Care (Signed)
  Problem: Education: Goal: Knowledge of General Education information will improve Description: Including pain rating scale, medication(s)/side effects and non-pharmacologic comfort measures Outcome: Progressing   Problem: Health Behavior/Discharge Planning: Goal: Ability to manage health-related needs will improve Outcome: Progressing   Problem: Clinical Measurements: Goal: Will remain free from infection Outcome: Progressing Goal: Diagnostic test results will improve Outcome: Progressing Goal: Respiratory complications will improve Outcome: Progressing   Problem: Activity: Goal: Risk for activity intolerance will decrease Outcome: Progressing   Problem: Pain Managment: Goal: General experience of comfort will improve Outcome: Progressing

## 2022-03-30 NOTE — Progress Notes (Addendum)
Brief pharmacy note  Pharmacy consulted to dose protamine for hematemesis in pt on UFH > LMWH for new Afib.  Lovenox '60mg'$  given at 10p; RN notes that she inadvertently forgot to turn off heparin infusion 1h after Lovenox given, so heparin infusion ran until pt vomited blood and she realized the heparin was still running; pt rec'd duplication of therapeutic anticoagulation for approximately 3h, though this would not ordinarily be enough alone to cause bleeding so there is likely another underlying cause (this is already being addressed by [facility]).  Documented rhythm on MAR has been NSR for several days.  Plan: D/c Lovenox for now and f/u w/ day team to consider resuming. Promatine '50mg'$  IV x1 now; could consider second dose of '25mg'$  if pt continues to actively bleed. Will get PTT with am labs to help assess.  [name], PharmD, BCPS 03/11/2022 2:41 AM    Addendum: Labs drawn ~2h after protamine administration.  Anti-Xa level 0.36 but PTT 39; heparin has essentially been neutralized.  Hgb 7.7, low but fairly stable within this admission.  RN reports no further overt signs of bleeding.  Will hold off on second dose of protamine.   VB 5:37 AM

## 2022-03-30 NOTE — Anesthesia Procedure Notes (Signed)
Procedure Name: Intubation Date/Time: 03/27/2022 11:07 AM  Performed by: Colin Benton, CRNAPre-anesthesia Checklist: Patient identified, Emergency Drugs available, Suction available and Patient being monitored Patient Re-evaluated:Patient Re-evaluated prior to induction Oxygen Delivery Method: Circle system utilized Preoxygenation: Pre-oxygenation with 100% oxygen Induction Type: IV induction, Rapid sequence and Cricoid Pressure applied Laryngoscope Size: Miller and 2 Grade View: Grade I Tube type: Oral Tube size: 7.0 mm Number of attempts: 1 Airway Equipment and Method: Stylet Placement Confirmation: ETT inserted through vocal cords under direct vision, positive ETCO2 and breath sounds checked- equal and bilateral Secured at: 21 cm Tube secured with: Tape Dental Injury: Teeth and Oropharynx as per pre-operative assessment

## 2022-03-30 NOTE — Progress Notes (Signed)
Physical Therapy Treatment Patient Details Name: Courtney Arnold MRN: [id number] DOB: 02/15/1941 Today's Date: 03/16/2022   History of Present Illness Pt is an 82 y.o. female admitted 03/29/2022 with abdominal pain, 3 weeks N&V daughter feels due to new meds for MM.  Found to have R&L pleural effusions. Patient underwent R thoracentesis 03/31/2022, had small pneumo.  Other PMH includes relapsed IgA kappa plasma cell myeloma, hypothyroidism, PAF, HTN.    PT Comments    Received pt semi-reclined in bed. Pt on 3L O2 with SPO2 100% throughout session. Pt required mod A to roll and max A to transfer semi-reclined<>sitting EOB with max multimodal cues for sequencing. Pt initially with L lateral and posterior lean requiring mod A fading to close supervision for static sitting balance. Pt stood x 2 trials with RW and max fading to mod A of 1, however unable to safely pivot to recliner due to LE weakness and fatigue. Pt requested to lie back down and transferred sit<>supine with max A and required max/total A and increased time using bed features to scoot to [facility]. Continue to recommend SNF at this time due to current deficits. Acute PT to cont to follow.     Recommendations for follow up therapy are one component of a multi-disciplinary discharge planning process, led by the attending physician.  Recommendations may be updated based on patient status, additional functional criteria and insurance authorization.  Follow Up Recommendations  Skilled nursing-short term rehab (<3 hours/day) Can patient physically be transported by private vehicle: No   Assistance Recommended at Discharge Frequent or constant Supervision/Assistance  Patient can return home with the following Two people to help with walking and/or transfers;A lot of help with bathing/dressing/bathroom;Direct supervision/assist for medications management;Assist for transportation;Help with stairs or ramp for entrance;Assistance with  cooking/housework;Direct supervision/assist for financial management   Equipment Recommendations  Other (comment) (TBD in next venue)    Recommendations for Other Services       Precautions / Restrictions Precautions Precautions: Fall Restrictions Weight Bearing Restrictions: No     Mobility  Bed Mobility Overal bed mobility: Needs Assistance Bed Mobility: Rolling, Supine to Sit, Sit to Supine Rolling: Mod assist   Supine to sit: Max assist, HOB elevated Sit to supine: Max assist   General bed mobility comments: pt required multimodal cues for rolling technique and hand placement on bedrails. Pt required assist to bring LEs off EOB and to bring trunk upright. Pt with L lateral and posterior lean that imrroved with cues to place feet on floor and reach for footboard of bed. Patient Response: Flat affect  Transfers   Equipment used: Rolling walker (2 wheels) Transfers: Sit to/from Stand Sit to Stand: Max assist, Mod assist           General transfer comment: Stood from elevated EOB x 1 with RW and max A and x 1 with RW and mod A. Noted bilateral knees flexed and trembeling and pt unable to remain standing >10 seconds prior to sitting back down on bed    Ambulation/Gait               General Gait Details: unable to due safety concerns   Stairs             Wheelchair Mobility    Modified Rankin (Stroke Patients Only)       Balance Overall balance assessment: Needs assistance Sitting-balance support: Bilateral upper extremity supported Sitting balance-Leahy Scale: Poor Sitting balance - Comments: initially required mod A fading to close  supervision for static sitting balance Postural control: Left lateral lean, Posterior lean Standing balance support: Bilateral upper extremity supported, Reliant on assistive device for balance (RW) Standing balance-Leahy Scale: Poor Standing balance comment: required mod A to maintatin static standing balance                             Cognition Arousal/Alertness: Awake/alert Behavior During Therapy: Flat affect Overall Cognitive Status: No family/caregiver present to determine baseline cognitive functioning Area of Impairment: Following commands, Problem solving                       Following Commands: Follows one step commands with increased time     Problem Solving: Slow processing, Decreased initiation, Difficulty sequencing, Requires verbal cues, Requires tactile cues General Comments: Pt on 3L O2 with SPO2 100% throughout session. Pt constantly asking for "cold drink" despite explaining current NPO status        Exercises      General Comments        Pertinent Vitals/Pain Pain Assessment Pain Assessment: No/denies pain    Home Living                          Prior Function            PT Goals (current goals can now be found in the care plan section) Acute Rehab PT Goals Patient Stated Goal: family requesting SNF PT Goal Formulation: With patient/family Time For Goal Achievement: 04/10/22 Potential to Achieve Goals: Fair Progress towards PT goals: Progressing toward goals    Frequency    Min 3X/week      PT Plan Current plan remains appropriate    Co-evaluation              AM-PAC PT "6 Clicks" Mobility   Outcome Measure  Help needed turning from your back to your side while in a flat bed without using bedrails?: A Lot Help needed moving from lying on your back to sitting on the side of a flat bed without using bedrails?: Total Help needed moving to and from a bed to a chair (including a wheelchair)?: Total Help needed standing up from a chair using your arms (e.g., wheelchair or bedside chair)?: A Lot Help needed to walk in hospital room?: Total Help needed climbing 3-5 steps with a railing? : Total 6 Click Score: 8    End of Session Equipment Utilized During Treatment: Gait belt Activity Tolerance: Patient limited  by fatigue Patient left: in bed;with call bell/phone within reach;with bed alarm set Nurse Communication: Mobility status PT Visit Diagnosis: Muscle weakness (generalized) (M62.81);Other abnormalities of gait and mobility (R26.89);Unsteadiness on feet (R26.81)     Time: 1610-9604 PT Time Calculation (min) (ACUTE ONLY): 22 min  Charges:  $Therapeutic Activity: 8-22 mins                     Becky Sax PT, DPT  Blenda Nicely 03/13/2022, 10:02 AM

## 2022-03-30 NOTE — Interval H&P Note (Signed)
History and Physical Interval Note:  03/31/2022 10:47 AM  Courtney Arnold  has presented today for surgery, with the diagnosis of Bloody emesis early this morning.  Previous history nosebleed the previous day.  Previous history of nonbloody nausea, vomiting.  The various methods of treatment have been discussed with the patient and family. After consideration of risks, benefits and other options for treatment, the patient has consented to  Procedure(s): ESOPHAGOGASTRODUODENOSCOPY (EGD) WITH PROPOFOL (N/A) as a surgical intervention.  The patient's history has been reviewed, patient examined, no change in status, stable for surgery.  I have reviewed the patient's chart and labs.  Questions were answered to the patient's satisfaction.     Tarhonda Hollenberg

## 2022-03-30 NOTE — Progress Notes (Addendum)
[facility] Progress Note  Patient: Courtney Arnold     NWG:[id number]  DOA: 04/03/2022   PCP: Derek Jack, MD       Brief hospital course: This is an 82 year old female with multiple myeloma, paroxysmal atrial fibrillation, COPD, pancytopenia requiring packed red blood cell and platelet transfusions, hypothyroidism who presented to [facility] on 1/15 with nausea vomiting and poor oral intake for the past 3 weeks.  CT of chest abdomen pelvis revealed multifocal pneumonia and possible abscess in the right middle lobe with moderate right and small left pleural effusions. She was transferred to [facility] on 1/16 at the recommendation of pulmonary to obtain consult with pulmonary here. She developed A fib with RVR and was started on an Amio infusion 1/17> PCCM recommends to hold off on Bronch, cont abx, swich IV Amio to oral 1/18> thoracentesis of right pleural space and 200 cc of cloudy fluid obtained-has a small iatrogenic pneumothorax- see details below ID consult> start Levaquin, Discussion with her oncologist- see below 1/19- began to have frequent small stools which have continued 1/21- resume Heparin, increase frequency of Imodium 1/23 - early morning report of hematemesis. Protamine given, anticoagulation stopped   Per Dr. Wynelle Cleveland on 1/21: "I do not feel the patient has the capacity to make her own medical decisions and I have relayed this to her daughters numerous times.  I have had discussions with both daughters Vivien Rota [facility]) on phone and [name] in person.  Very poor prognosis as she is still not able to eat and is now having multiple loose stools. This, along with Multiple Myeloma, pancytopenia, New onset A-fib with RVR, nausea/ vomiting, pneumonia/lung abscess, transudative pleural effusion (from third spacing and possible diastolic CHF) and pneumothorax suggests that she will have a poor outcome. [name] does not feel her mother can go home as she is unable to take care  of her 24 hrs a day and has no one else to care for her 24 hrs a day. She states her mother has been a 2 person assist at home.  The patient persistently states she can live at home alone and does not comprehend the gravity of her condition.  I have spoken with [name] again today and reviewed CT scans from 2021 and current hospital stay to show her the extent of the patient's multiple myeloma."    Subjective:  Overnight report of one episode hematemesis. Did have nosebleed yesterday. No complaints today  Assessment and Plan: Principal Problem:   Multifocal pneumonia with Lung Abscess and bilateral pleural effusions - likely aspiration from vomiting at home but cannot rule out other infections as she is immune compromised -CT reveals bilateral multifocal nodules with consolidation in the right middle lobe, right moderate pleural effusion and small left pleural effusion-near complete resolution of previously noted left upper lobe consolidation  -  ID has evaluated the patient and recommended oral levaquin. Given ongoing loose stools they advise continuing through 1/25 and then trial off it  Hematemesis Vs. epistaxis. Has hx of nosebleeds and had one yesterday that was minor and resolved spontaneously. This morning rn reported vomiting up blood after being given water. Has not had recurrence. Hgb 7.7 today from 8 yesterday. - gi to see, npo for now - now off lovenox, protamine given - will try to wean off o2 as likely contributes to nosebleed. If unable to do so will plan on humidified air. Will also start afrin. - repeat pm hgb - on ppi gtt  Iatrogenic right  pneumothorax Hypoxic respiratory failure - Post thoracentesis-  improving  - on 2 liters, wean as able  Nausea/ vomiting with poor oral intake -She has had vomiting for few weeks and continued to do so in the hospital -Abdomen distended on exam but has been non tender  - CT scan on 1/16 which revealed a surgically absent gallbladder,  CBD of 8 mm, small hiatal hernia with circumferential esophageal wall thickening suggestive of GERD or less likely esophageal neoplasm - The patient takes Decadron twice a week as outpt - Per family and RN, she vomits/regurgitates just within a few minutes of eating-  - SLP eval done- no signs of aspiration - no thrush noted - continue pantop as above - GI to see today, possible EGD  Loose stool - possibly from antibiotics- abdomen is still non tender so less likely it is infectious. This morning loose stool but not watery - Imodium PRN  Multiple myeloma, relapsed Fatigue related to cancer - Receiving pomalidomide as outpatient with 1/16 to have been the date of her next cycle - Has lesions throughout her skeleton - Receives acyclovir prophylaxis twice a day, Decadron and Ritalin - Her oncologist has recommended to hold Pomalidomide & Decadron and follow-up with him as outpatient to decide on whether or not to resume these  Paroxysmal atrial fibrillation with RVR -new diagnosis Right pleural effusion Mod pulm HTN- possibility of diastolic dysfunction - [place] Vasc score is 4 - 2 D ECHO> EF 55-60%, unable to evaluate diastolic function, b/l atrial dilation, mild to mod TR, mo elevated pulm pressure, normal IV function with mild RV enlargement - Placed on IV amiodarone and transitioned to oral amiodarone   - plan for 400 mg twice daily x 10 days (stop date 1/25), then 200 mg daily and then outpatient follow-up - anticoagulation now on hold as above  Pancytopenia - Continue to follow  Hypothyroidism -TSH 5.6-likely sick euthyroid - Continue levothyroxine  Urinary retention? RN says foley was placed for retention though don't see documentation of that, will d/c and monitor for retention  Right lower extremity swelling Per daughter this is new. U/s 1/22 shows no signs DVT  Left upper extremity swelling - u/s ordered  Debility PT advising snf, family interested in that. TOC  consulted      Code Status: DNR Consultants: Cardiology, pulmonary, palliative Level of Care: Level of care: Progressive Total time on patient care: 35 minutes DVT prophylaxis: lovenox Family contact: daughter updated telephonically 1/23  Objective:   Vitals:   03/20/2022 0229 03/31/2022 0441 04/02/2022 0723 03/31/2022 0853  BP: 113/61 (!) 99/47 (!) 96/51 (!) 96/56  Pulse: 72  72   Resp: (!) '22 20 20   '$ Temp:  (!) 97.3 F (36.3 C)  (!) 97.3 F (36.3 C)  TempSrc:  Oral  Oral  SpO2:  100% 99%   Weight:      Height:       Filed Weights   03/23/22 1225 03/11/2022 1356  Weight: 57.3 kg 57.3 kg   Exam: General exam: Appears comfortable - confused to situation  HEENT: dried blood right nare Respiratory system: Clear to auscultation- continues to have tachypnea but no distress Cardiovascular system: S1 & S2 heard  Gastrointestinal system: Abdomen soft, non-tender, nondistended. Normal bowel sounds   Extremities: No cyanosis, clubbing or edema Psychiatry:  flat affect   CBC: Recent Labs  Lab 03/26/22 0205 03/27/22 0117 03/28/22 1257 03/29/22 1140 03/26/2022 0352  WBC 4.1 5.2 4.7 4.4 3.5*  HGB 8.3* 7.7*  8.1* 8.0* 7.7*  HCT 25.0* 23.5* 25.1* 24.9* 23.9*  MCV 96.9 98.3 100.0 100.4* 103.5*  PLT 104* 100* 102* 92* 82*   Basic Metabolic Panel: Recent Labs  Lab 03/26/22 0205 03/27/22 0117 03/27/22 0144 03/28/22 1257 03/29/22 1140 03/15/2022 0426  NA 135 136  --  136 137 137  K 3.5 3.4*  --  4.5 4.4 4.3  CL 107 109  --  110 110 110  CO2 20* 20*  --  19* 20* 20*  GLUCOSE 97 101*  --  132* 137* 97  BUN 21 18  --  '13 13 13  '$ CREATININE 1.05* 1.03*  --  0.86 0.85 0.92  CALCIUM 6.4* 6.7*  --  6.9* 6.9* 6.9*  MG  --   --  1.8  --   --   --    GFR: Estimated Creatinine Clearance: 38.6 mL/min (by C-G formula based on SCr of 0.92 mg/dL).  Scheduled Meds:  acyclovir  400 mg Oral BID   amiodarone  400 mg Oral BID   Chlorhexidine Gluconate Cloth  6 each Topical Daily    dextromethorphan-guaiFENesin  1 tablet Oral BID   [START ON 04/02/2022] levofloxacin  250 mg Oral Daily   levothyroxine  137 mcg Oral QAC breakfast   [START ON 04/02/2022] pantoprazole  40 mg Intravenous Q12H   sodium chloride flush  3 mL Intravenous Q12H   sodium chloride flush  3 mL Intravenous Q12H   Continuous Infusions:  sodium chloride     levofloxacin (LEVAQUIN) IV 750 mg (03/29/22 2207)   pantoprazole 8 mg/hr (03/14/2022 0322)   Imaging and lab data was personally reviewed VAS [place] LOWER EXTREMITY VENOUS (DVT)  Result Date: 03/29/2022  Lower Venous DVT Study Patient Name:  Courtney Arnold  Date of Exam:   03/29/2022 Medical Rec #: [id number]         Accession #:    [id number] Date of Birth: 02-19-1941         Patient Gender: F Patient Age:   76 years Exam Location:  [facility] Procedure:      VAS [place] LOWER EXTREMITY VENOUS (DVT) Referring Phys: Laurey Arrow --------------------------------------------------------------------------------  Indications: Swelling, and Edema.  Comparison Study: no prior Performing Technologist: Archie Patten RVS  Examination Guidelines: A complete evaluation includes B-mode imaging, spectral Doppler, color Doppler, and power Doppler as needed of all accessible portions of each vessel. Bilateral testing is considered an integral part of a complete examination. Limited examinations for reoccurring indications may be performed as noted. The reflux portion of the exam is performed with the patient in reverse Trendelenburg.  +---------+---------------+---------+-----------+----------+--------------+ RIGHT    CompressibilityPhasicitySpontaneityPropertiesThrombus Aging +---------+---------------+---------+-----------+----------+--------------+ CFV      Full           Yes      Yes                                 +---------+---------------+---------+-----------+----------+--------------+ SFJ      Full                                                         +---------+---------------+---------+-----------+----------+--------------+ FV Prox  Full                                                        +---------+---------------+---------+-----------+----------+--------------+  FV Mid   Full                                                        +---------+---------------+---------+-----------+----------+--------------+ FV DistalFull                                                        +---------+---------------+---------+-----------+----------+--------------+ PFV      Full                                                        +---------+---------------+---------+-----------+----------+--------------+ POP      Full           Yes      Yes                                 +---------+---------------+---------+-----------+----------+--------------+ PTV      Full                                                        +---------+---------------+---------+-----------+----------+--------------+ PERO     Full           Yes      Yes                                 +---------+---------------+---------+-----------+----------+--------------+     Summary: RIGHT: - There is no evidence of deep vein thrombosis in the lower extremity.  - No cystic structure found in the popliteal fossa.  LEFT: - No evidence of common femoral vein obstruction.  *See table(s) above for measurements and observations. Electronically signed by Servando Snare MD on 03/29/2022 at 7:48:34 PM.    Final     LOS: 8 days   Author: Desma Maxim  04/03/2022 9:09 AM  To contact [facility]>   Check the care team in [facility] and look for the attending/consulting [facility] provider listed  Log into [email or web address] and use [facility]'s universal password   Go to> "[facility]"  and find provider  If you still have difficulty reaching the provider, please page the [facility] (Director on Call) for the Hospitalists listed on amion

## 2022-03-30 NOTE — Progress Notes (Signed)
Pt vomited bright red blood. Bp 113/61, RR 26, P78. Pt denies pain. Heparin stopped. J. Gardner,MD notified.  Orders given.

## 2022-03-30 NOTE — Significant Event (Addendum)
[facility] night coverage note:  Called by RN: pt with episode of hematemesis.  Bright red with some clots per RN.  BP 113/61, RR 26, P78.  Pt denies pain.  RN has already stopped heparin drip. Ordering protamine per pharm consult for heparin reversal given apparent acute GIB Stat CBC Type and screen Starting protonix drip (had already been on 40 bid). Sending message to GI for AM consult. NPO except for sips with meds

## 2022-03-30 NOTE — Op Note (Signed)
[facility] Patient Name: Courtney Arnold Procedure Date : 03/29/2022 MRN: [id number] Attending MD: Mauri Pole , MD, [id number] Date of Birth: 27-Mar-1940 CSN: [id number] Age: 82 Admit Type: Inpatient Procedure:                Upper GI endoscopy Indications:              Active gastrointestinal bleeding, Suspected upper                            gastrointestinal bleeding, Hematemesis Providers:                Mauri Pole, MD, Grace Isaac, RN, Fransico Setters                            [place]bumina, Technician Referring MD:              Medicines:                General Anesthesia Complications:            No immediate complications. Estimated Blood Loss:     Estimated blood loss was minimal. Procedure:                Pre-Anesthesia Assessment:                           - Prior to the procedure, a History and Physical                            was performed, and patient medications and                            allergies were reviewed. The patient's tolerance of                            previous anesthesia was also reviewed. The risks                            and benefits of the procedure and the sedation                            options and risks were discussed with the patient.                            All questions were answered, and informed consent                            was obtained. Prior Anticoagulants: The patient                            last took heparin on the day of the procedure, was                            held 6 hours prior to the procedure. ASA Grade  Assessment: III - A patient with severe systemic                            disease. After reviewing the risks and benefits,                            the patient was deemed in satisfactory condition to                            undergo the procedure.                           After obtaining informed consent, the endoscope was                             passed under direct vision. Throughout the                            procedure, the patient's blood pressure, pulse, and                            oxygen saturations were monitored continuously. The                            [id number]-H190 ([id number]) Olympus endoscope was introduced                            through the mouth, and advanced to the second part                            of duodenum. The upper GI endoscopy was                            accomplished without difficulty. The patient                            tolerated the procedure well. Scope In: Scope Out: Findings:      Post nasal drainage and blood was seen in the posterior oropharynx.      Patchy, yellow plaques were found in the entire esophagus. Biopsies were       taken with a cold forceps for histology.      The entire examined stomach was normal.      The cardia and gastric fundus were normal on retroflexion.      A large polypoid mass with no bleeding was found in the second portion       of the duodenum. Biopsies were taken with a cold forceps for histology. Impression:               - Post nasal drainage is seen in the oropharynx.                           - Esophageal plaques were found, suspicious for  candidiasis. Biopsied.                           - Normal stomach.                           - Rule out malignancy, duodenal mass. Biopsied.                           No obviouse source in the upper GI tract for                            bleed/hematemeis, likely source is bleeding from                            posterior oropharynx/epistaxis Recommendation:           - Resume previous diet.                           - Continue present medications.                           - Await pathology results.                           - Diflucan (fluconazole) 100 mg PO daily for 3                            weeks.                           - Please consult ENT for epistaxis                            - Ok to restart heparin from GI standpoint Procedure Code(s):        --- Professional ---                           [phone], Esophagogastroduodenoscopy, flexible,                            transoral; with biopsy, single or multiple Diagnosis Code(s):        --- Professional ---                           R09.82, Postnasal drip                           K22.9, Disease of esophagus, unspecified                           K31.89, Other diseases of stomach and duodenum                           K92.2, Gastrointestinal hemorrhage, unspecified  K92.0, Hematemesis CPT copyright 2022 [employer]. All rights reserved. The codes documented in this report are preliminary and upon coder review may  be revised to meet current compliance requirements. Mauri Pole, MD 03/19/2022 11:40:09 AM This report has been signed electronically. Number of Addenda: 0

## 2022-03-30 NOTE — Transfer of Care (Signed)
Immediate Anesthesia Transfer of Care Note  Patient: Courtney Arnold  Procedure(s) Performed: ESOPHAGOGASTRODUODENOSCOPY (EGD) WITH PROPOFOL BIOPSY  Patient Location: Endoscopy Unit  Anesthesia Type:General  Level of Consciousness: drowsy  Airway & Oxygen Therapy: Patient Spontanous Breathing and Patient connected to nasal cannula oxygen  Post-op Assessment: Report given to RN and Post -op Vital signs reviewed and stable  Post vital signs: Reviewed and stable  Last Vitals:  Vitals Value Taken Time  BP 115/51 04/03/2022 1142  Temp    Pulse 83 03/29/2022 1144  Resp 25 03/18/2022 1144  SpO2 94 % 03/21/2022 1144  Vitals shown include unvalidated device data.  Last Pain:  Vitals:   03/08/2022 1016  TempSrc: Temporal  PainSc: 0-No pain         Complications: No notable events documented.

## 2022-03-30 NOTE — Anesthesia Preprocedure Evaluation (Signed)
Anesthesia Evaluation  Patient identified by MRN, date of birth, ID band Patient awake    Reviewed: Allergy & Precautions, NPO status , Patient's Chart, lab work & pertinent test results  History of Anesthesia Complications Negative for: history of anesthetic complications  Airway Mallampati: III  TM Distance: >3 FB Neck ROM: Full    Dental  (+) Edentulous Upper, Edentulous Lower, Dental Advisory Given   Pulmonary former smoker   breath sounds clear to auscultation       Cardiovascular hypertension,  Rhythm:Regular  1. Left ventricular ejection fraction, by estimation, is 55 to 60%. The  left ventricle has normal function. Left ventricular endocardial border  not optimally defined to evaluate regional wall motion. Left ventricular  diastolic function could not be  evaluated.   2. Right ventricular systolic function is normal. The right ventricular  size is mildly enlarged. There is moderately elevated pulmonary artery  systolic pressure. The estimated right ventricular systolic pressure is  50.9 mmHg.   3. Left atrial size was severely dilated.   4. Right atrial size was severely dilated.   5. The mitral valve is abnormal. Mild mitral valve regurgitation. No  evidence of mitral stenosis.   6. Tricuspid valve regurgitation is mild to moderate.   7. The aortic valve was not well visualized. Aortic valve regurgitation  is trivial. No aortic stenosis is present.   8. The inferior vena cava is normal in size with <50% respiratory  variability, suggesting right atrial pressure of 8 mmHg.     Neuro/Psych  PSYCHIATRIC DISORDERS Anxiety        GI/Hepatic negative GI ROS, Neg liver ROS,,,  Endo/Other  Hypothyroidism    Renal/GU Lab Results      Component                Value               Date                      CREATININE               0.92                03/16/2022                Musculoskeletal  (+) Arthritis ,     Abdominal   Peds  Hematology  (+) Blood dyscrasia, anemia Lab Results      Component                Value               Date                      WBC                      3.5 (L)             03/08/2022                HGB                      7.7 (L)             03/08/2022                HCT  23.9 (L)            03/28/2022                MCV                      103.5 (H)           03/26/2022                PLT                      82 (L)              03/29/2022              Anesthesia Other Findings   Reproductive/Obstetrics                             Anesthesia Physical Anesthesia Plan  ASA: 3 and emergent  Anesthesia Plan: General   Post-op Pain Management: Minimal or no pain anticipated   Induction: Intravenous, Rapid sequence and Cricoid pressure planned  PONV Risk Score and Plan: 3 and Ondansetron and Dexamethasone  Airway Management Planned: Oral ETT  Additional Equipment: None  Intra-op Plan:   Post-operative Plan: Possible Post-op intubation/ventilation  Informed Consent: I have reviewed the patients History and Physical, chart, labs and discussed the procedure including the risks, benefits and alternatives for the proposed anesthesia with the patient or authorized representative who has indicated his/her understanding and acceptance.    Discussed DNR with patient and Continue DNR.   Dental advisory given  Plan Discussed with: CRNA  Anesthesia Plan Comments:        Anesthesia Quick Evaluation

## 2022-03-30 NOTE — H&P (View-Only) (Signed)
[facility] Consult: 9:16 AM 03/29/2022  LOS: 8 days    Referring Provider: Dr Si Raider.   Primary Care Physician:  Derek Jack, MD Primary Gastroenterologist:  Dr. Manus Rudd    Reason for Consultation:  Hematemesis.     HPI: Courtney Arnold is a 82 y.o. female. PMH below  04/2015 ERCP with sphincterotomy, stenting of CBD.  Dr Laural Golden noted small periampullary polyp, normal ampulla of Vater and tiny ampullary orifice.  Dilated CBD, dilated CHD, dilated intra hepatic radicles.  No filling defects encountered but distal CBD poorly visualized because of dense contrast within colon.  10 French, 7 cm plastic stent placed to CBD. 06/2015 ERCP. with removal of plastic biliary stent.  Indeterminant biliary stricture at lower main bile duct.  Mid and upper bile duct moderately dilated.  The stricture was not stented as the patient had refused repeat stent placement. 06/2015 EGD.  For nausea, vomiting, abdominal pain, weight loss, biliary stent removal.  Performed by Dr. Melony Overly.  LA grade B reflux esophagitis.  Irregular Z-line.  Antral and duodenal erythema.  Normal second duodenum.  Plastic stent in 2nd duodenum. 11/2015 colonoscopy.  Surveillance study for patient history colon polyps.  Tortuous colon.  Colonic diverticulosis in the sigmoid, descending.  No polyps, no mucosal abnormalities, no pathology collected.  Unable to retroflex of rectum     Presented to [facility] 1/15 with N/V and poor p.o. intake for at least 3 weeks.  Imaging revealed multifocal pneumonia, possible right lung abscess and bil pleural effusions.  Transferred to [facility] 1/16 to allow for pulmonary consult.  Developed A-fib, RVR treated with amiodarone, heparin.  Right thoracentesis 1/18.  Received 1 PRBC on 1/15 when her Hb was 7.  Note that  it was as low as 6.4 02/24/2022 and baseline are the eights, but up to 10.8 on 03/04/2022.   On 1/19 developed frequent, small, nonbloody/nonmelenic stools.  Persistent poor p.o. intake with malnutrition.  Yesterday, 1/22 had episode limited epistaxis.  Around 2 AM this morning patient developed acute hematemesis, vomiting up clots of blood and heparin drip discontinued, PPI drip initiated as she had already been receiving Protonix 40 IV every 12.  No further episodes of hematemesis.  No reports of bloody or melenic stool. Pt denies abdominal pain or nausea currently  Patient has had chronic hypotension currently pressures 90s/40s to 50s follow the previous range.  Tachycardia and currently in NSR.  Oxygen saturation upper 90s to 100% on 2 L nasal cannula oxygen. Hgb this a.m. 7.7, same as it was 3 days ago, it was 8 yesterday morning. Preserved renal function with BUN.  LFTs, lipase have  Lives in [place].  Widowed.  Requiring 2 person assist at home prior to admission.  No history of alcohol use.  Former smoker  Family history of breast cancer in her sister.  Father with heart attack and stroke.     Past Medical History:  Diagnosis Date   Atrial fibrillation [facility]) 2011   Postop, spontaneous conversion to normal sinus after one hour   CAP (community  acquired pneumonia) 09/23/2021   Cholecystitis, acute 04/25/2015   Compression fracture 07/24/2009   T12; kyphoplasty   History of echocardiogram 07/2009   EF 65%   Hypertension    Thyroid disease    Tobacco abuse     Past Surgical History:  Procedure Laterality Date   BACK SURGERY     BACK SURGERY  06/06/2015   BREAST EXCISIONAL BIOPSY Left    50 years ago  benign   CHOLECYSTECTOMY N/A 04/25/2015   Procedure: LAPAROSCOPIC CHOLECYSTECTOMY WITH INTRAOPERATIVE CHOLANGIOGRAM;  Surgeon: Mickeal Skinner, MD;  Location: [facility];  Service: General;  Laterality: N/A;   COLONOSCOPY N/A 11/26/2015   Procedure: COLONOSCOPY;  Surgeon:  Daneil Dolin, MD;  Location: [facility];  Service: Endoscopy;  Laterality: N/A;  7:30 am   ERCP N/A 04/14/2015   Procedure: ENDOSCOPIC RETROGRADE CHOLANGIOPANCREATOGRAPHY (ERCP) Biliary Sphincterotomy, 10x7 stent placement Dilated bilary system just not well seen;  Surgeon: Rogene Houston, MD;  Location: [facility];  Service: Endoscopy;  Laterality: N/A;   ERCP N/A 06/12/2015   Procedure: ENDOSCOPIC RETROGRADE CHOLANGIOPANCREATOGRAPHY (ERCP);  Surgeon: Rogene Houston, MD;  Location: [facility];  Service: Endoscopy;  Laterality: N/A;   ESOPHAGOGASTRODUODENOSCOPY N/A 06/12/2015   Procedure: DIAGNOSTIC ESOPHAGOGASTRODUODENOSCOPY (EGD);  Surgeon: Rogene Houston, MD;  Location: [facility];  Service: Endoscopy;  Laterality: N/A;   FEMUR IM NAIL Right 05/11/2019   Procedure: RETROGRADE INTRAMEDULLARY NAIL FEMORAL;  Surgeon: Shona Needles, MD;  Location: [place];  Service: Orthopedics;  Laterality: Right;   STENT REMOVAL  06/12/2015   Procedure: STENT REMOVAL ;  Surgeon: Rogene Houston, MD;  Location: [facility];  Service: Endoscopy;;   THORACENTESIS N/A 03/08/2022   Procedure: Mathews Robinsons;  Surgeon: [name], MD;  Location: [place];  Service: Cardiopulmonary;  Laterality: N/A;    Prior to Admission medications   Medication Sig Start Date End Date Taking? Authorizing Provider  acyclovir (ZOVIRAX) 400 MG tablet TAKE (1) TABLET BY MOUTH TWICE DAILY. Patient taking differently: Take 400 mg by mouth 2 (two) times daily. 01/11/22  Yes Derek Jack, MD  CALCIUM PO Take 1 capsule by mouth daily.   Yes [provider]  carboxymethylcellulose (REFRESH PLUS) 0.5 % SOLN Place 1 drop into both eyes at bedtime.   Yes [provider]  cephALEXin (KEFLEX) 500 MG capsule Take 1 capsule (500 mg total) by mouth 2 (two) times daily. 03/17/22  Yes Derek Jack, MD  Cholecalciferol (VITAMIN D) 125 MCG (5000 UT) CAPS Take 5,000 Units by mouth daily. 05/14/19  Yes McClung, Sarah A,  PA-C  Cyanocobalamin (B-12 PO) Take 1 capsule by mouth daily.   Yes [provider]  dexamethasone (DECADRON) 4 MG tablet Take 2.5 tablets (10 mg total) by mouth 2 (two) times a week. 12/24/21  Yes Derek Jack, MD  levothyroxine (SYNTHROID) 137 MCG tablet Take 1 tablet (137 mcg total) by mouth daily before breakfast. 02/16/22  Yes Shamleffer, Melanie Crazier, MD  methylphenidate (RITALIN) 5 MG tablet Take 10 mg by mouth daily. Only on days she is not taking dexamethasone tue,thu,fri,sat,sun   Yes [provider]  montelukast (SINGULAIR) 10 MG tablet Take one tablet by mouth 1 hour prior to Darzalex injection appointments Patient taking differently: Take one tablet by mouth 1 hour prior to Darzalex injection appointments - given every other Wednesday 10/28/21  Yes Derek Jack, MD  Multiple Vitamin (MULTIVITAMIN WITH MINERALS) TABS tablet Take 1 tablet by mouth daily. 09/25/21  Yes Johnson, Clanford  L, MD  pomalidomide (POMALYST) 3 MG capsule Take 1 capsule (3 mg total) by mouth daily. Take for 21 days, then hold for 7 days. Repeat every 28 days. 03/12/22  Yes Derek Jack, MD  potassium chloride (KLOR-CON M) 10 MEQ tablet Take 2 tablets (20 mEq total) by mouth daily. 03/04/22  Yes Derek Jack, MD  traMADol (ULTRAM) 50 MG tablet Take 1 tablet (50 mg total) by mouth every 12 (twelve) hours as needed. 03/19/22  Yes Derek Jack, MD  ondansetron (ZOFRAN) 8 MG tablet Take 1 tablet (8 mg total) by mouth every 8 (eight) hours as needed for nausea or vomiting. Patient not taking: Reported on 03/17/2022 09/21/21   Derek Jack, MD    Scheduled Meds:  acyclovir  400 mg Oral BID   amiodarone  400 mg Oral BID   Chlorhexidine Gluconate Cloth  6 each Topical Daily   dextromethorphan-guaiFENesin  1 tablet Oral BID   [START ON 04/02/2022] levofloxacin  250 mg Oral Daily   levothyroxine  137 mcg Oral QAC breakfast   oxymetazoline  1 spray Each Nare BID    [START ON 04/02/2022] pantoprazole  40 mg Intravenous Q12H   sodium chloride flush  3 mL Intravenous Q12H   sodium chloride flush  3 mL Intravenous Q12H   Infusions:  sodium chloride     levofloxacin (LEVAQUIN) IV 750 mg (03/29/22 2207)   pantoprazole 8 mg/hr (03/10/2022 0322)   PRN Meds: sodium chloride, acetaminophen **OR** acetaminophen, albuterol, bisacodyl, loperamide, ondansetron **OR** ondansetron (ZOFRAN) IV, oxyCODONE, polyethylene glycol, polyvinyl alcohol, sodium chloride flush, traZODone   Allergies as of 03/24/2022   (No Known Allergies)    Family History  Problem Relation Age of Onset   Heart attack Father    Stroke Father    Breast cancer Sister    Thyroid disease Neg Hx    Colon cancer Neg Hx     Social History   Socioeconomic History   Marital status: Widowed    Spouse name: Not on file   Number of children: 2   Years of education: Not on file   Highest education level: Not on file  Occupational History   Occupation: retired  Tobacco Use   Smoking status: Former    Packs/day: 0.15    Years: 20.00    Total pack years: 3.00    Types: Cigarettes    Quit date: 03/08/2013    Years since quitting: 9.0   Smokeless tobacco: Never  Vaping Use   Vaping Use: Never used  Substance and Sexual Activity   Alcohol use: No    Alcohol/week: 0.0 standard drinks of alcohol   Drug use: No   Sexual activity: Not Currently  Other Topics Concern   Not on file  Social History Narrative   Active in gardens and does yard work.   Social Determinants of Health   Financial Resource Strain: Low Risk  (05/08/2019)   Overall Financial Resource Strain (CARDIA)    Difficulty of Paying Living Expenses: Not hard at all  Food Insecurity: No Food Insecurity (01/14/2022)   Hunger Vital Sign    Worried About Running Out of Food in the Last Year: Never true    Ran Out of Food in the Last Year: Never true  Transportation Needs: No Transportation Needs (01/14/2022)   PRAPARE -  Hydrologist (Medical): No    Lack of Transportation (Non-Medical): No  Physical Activity: Inactive (05/08/2019)   Exercise Vital Sign    Days  of Exercise per Week: 0 days    Minutes of Exercise per Session: 0 min  Stress: No Stress Concern Present (05/08/2019)   [place]    Feeling of Stress : Not at all  Social Connections: Moderately Isolated (02/06/2020)   Social Connection and Isolation Panel [NHANES]    Frequency of Communication with Friends and Family: More than three times a week    Frequency of Social Gatherings with Friends and Family: More than three times a week    Attends Religious Services: More than 4 times per year    Active Member of [employer] or Organizations: No    Attends Archivist Meetings: Never    Marital Status: Widowed  Intimate Partner Violence: Not At Risk (01/14/2022)   Humiliation, Afraid, Rape, and Kick questionnaire    Fear of Current or Ex-Partner: No    Emotionally Abused: No    Physically Abused: No    Sexually Abused: No    REVIEW OF SYSTEMS: Constitutional: Weakness ENT:  No nose bleeds Pulm: Denies shortness of breath or chest pain. CV:  No palpitations, no LE edema.  Denies chest pain GU:  No hematuria, no frequency GI: Denies dysphagia. Heme: No reports of excessive bruising or bleeding other than this taxis and hematemesis. Transfusions: Per HPI. Neuro:  No headaches, no peripheral tingling or numbness no syncope, no seizure. Derm:  No itching, no rash or sores.  Endocrine:  No sweats or chills.  No polyuria or dysuria Immunization: Reviewed. Travel:  None beyond local counties in last few months.    PHYSICAL EXAM: Vital signs in last 24 hours: Vitals:   03/29/2022 0723 03/21/2022 0853  BP: (!) 96/51 (!) 96/56  Pulse: 72   Resp: 20   Temp:  (!) 97.3 F (36.3 C)  SpO2: 99%    Wt Readings from Last 3 Encounters:  03/08/2022 57.3 kg   03/17/22 57.3 kg  03/04/22 60.4 kg    General: Patient is aged, appears severely chronically ill as well as acutely ill she is alert and comfortable in the bed. Head: No facial asymmetry or swelling. Eyes: No scleral icterus.  No conjunctival pallor. + Exophthalmos Ears: He is hearing deficits Nose: Dried blood at both nares.  No congestion or discharge Mouth: Dentures in place.  Mucosa mildly dry lesions or exudate.  Tongue midline Neck: No JVD, no masses, no Lungs: Diminished but especially on the right.  Adventitious sounds.  No cough.  Slight bruising visible beneath Band-Aid at site.  Right thoracentesis for tomorrow can you. Heart: RRR.  NSR Abdomen: Not tender or distended.  Active bowel sounds.  No HSM, masses, bruits,.   Rectal: Deferred. Musc/Skeltl: No joint redness, swelling or gross deformity.  Well-healed scar R knee appears surgical nature. Extremities: 1-2+ pitting pedal edema bilaterally. Neurologic: Stable oriented to place, self. responded  2004 for the year.  Follows commands.  No tremors.  Moves all 4 limbs.  Strength not tested.  However she is very weak and unable to move her torso without assistance. Skin: No telangiectasia rash or significant bruising. Nodes: No cervical adenopathy Psych: Calm, cooperative, flat affect.  Not agitated or anxious.  Intake/Output from previous day: 01/22 0701 - 01/23 0700 In: 100 [I.V.:100] Out: 310 [Urine:310] Intake/Output this shift: No intake/output data recorded.  LAB RESULTS: Recent Labs    03/28/22 1257 03/29/22 1140 03/18/2022 0352  WBC 4.7 4.4 3.5*  HGB 8.1* 8.0* 7.7*  HCT 25.1* 24.9*  23.9*  PLT 102* 92* 82*    BMET Lab Results  Component Value Date   NA 137 03/21/2022   NA 137 03/29/2022   NA 136 03/28/2022   K 4.3 04/02/2022   K 4.4 03/29/2022   K 4.5 03/28/2022   CL 110 03/09/2022   CL 110 03/29/2022   CL 110 03/28/2022   CO2 20 (L) 04/04/2022   CO2 20 (L) 03/29/2022   CO2 19 (L) 03/28/2022    GLUCOSE 97 03/19/2022   GLUCOSE 137 (H) 03/29/2022   GLUCOSE 132 (H) 03/28/2022   BUN 13 03/26/2022   BUN 13 03/29/2022   BUN 13 03/28/2022   CREATININE 0.92 04/03/2022   CREATININE 0.85 03/29/2022   CREATININE 0.86 03/28/2022   CALCIUM 6.9 (L) 03/20/2022   CALCIUM 6.9 (L) 03/29/2022   CALCIUM 6.9 (L) 03/28/2022   LFT No results for input(s): "PROT", "ALBUMIN", "AST", "ALT", "ALKPHOS", "BILITOT", "BILIDIR", "IBILI" in the last 72 hours. PT/INR Lab Results  Component Value Date   INR 1.0 01/14/2022   INR 1.1 09/27/2021   INR 1.0 05/11/2019   Hepatitis Panel No results for input(s): "HEPBSAG", "HCVAB", "HEPAIGM", "HEPBIGM" in the last 72 hours. C-Diff No components found for: "CDIFF" Lipase     Component Value Date/Time   LIPASE 22 03/23/2022 7902    Drugs of Abuse  No results found for: "LABOPIA", "COCAINSCRNUR", "LABBENZ", "AMPHETMU", "THCU", "LABBARB"   RADIOLOGY STUDIES: VAS [place] LOWER EXTREMITY VENOUS (DVT)  Result Date: 03/29/2022      Summary: RIGHT: - There is no evidence of deep vein thrombosis in the lower extremity.  - No cystic structure found in the popliteal fossa.  LEFT: - No evidence of common femoral vein obstruction.  *See table(s) above for measurements and observations. Electronically signed by Servando Snare MD on 03/29/2022 at 7:48:34 PM.    Final       IMPRESSION:     N/V and hematemesis.  Issues with nonbloody nausea vomiting PTA had improved until yesterday.  Thank you episode of limited epistaxis yesterday afternoon so possibility of right epistaxis and also [place] hematemesis.  Since admission receiving Protonix 40 IV twice daily, switched to Protonix gtt. early this morning.  No PPI/H2B on home med list.    Frequent small stools starting 1/19. No stool studies since.  Exposure to multiple antibiotics both before and during this hospitalization.    Macrocytic anemia.  B12, folate, iron ok in summer 2023.  1 PRBC on 1/15.      Multiple myeloma.  Refractory/relapsed. Treatment w velcade, pomalidomide, dexamethasone.      PAF.  No AC PTA.  New Afib RVR.  Po amiodarone, SQ Lovenox once on 1/22, Heparin bolus on 1/17, infusion on 1/21 through 1230AM this morning (8 to 9 hours) now dc'd.       Thrombocytopenia.  Chronic.  Lows in 20s 02/2022.      Resp failure. COPD.  Multifocal PNA, lung abscesses. ID Suspects CAP rather than exotic organism.  Pulm nodules.  200 ml R thoracentesis 1/18 w small ptx.  No growth on clx. No malignancy on cytology.  ID following.  Levaquin    hypocalcemia.  Ca level still slightly low corrected dor low albumin.      Pt w Compromised medical decsion making   PLAN:      Spoke w pt dtr Rigoberto Noel.  She is fine w proceeding to EGD and understands she is higher risk for compications given her PNA and  general frail health.     Azucena Freed  03/20/2022, 9:16 AM Phone [phone]   Attending physician's note  I have taken a history, reviewed the chart and examined the patient. I performed a substantive portion of this encounter, including complete performance of at least one of the key components, in conjunction with the APP. I agree with the APP's note, impression and recommendations.    48 yr F with multifocal pneumonia, R lung abscess, pleural effusions bilateral, recent onset A-fib with RVR, iatrogenic pneumothorax s/p Thora developed hematemesis last night She has been having intermittent nausea and vomiting, epistaxis.  No history of melena or rectal bleeding Heparin gtt on hold since midnight NPO PPI Will proceed with EGD for further evaluation of hematemesis Exclude Mallory-Weiss tear, erosive esophagitis or gastroduodenal ulcer  The risks and benefits as well as alternatives of endoscopic procedure(s) have been discussed and reviewed. All questions answered. The patient and family agrees to proceed.     Damaris Hippo , MD [phone]

## 2022-03-30 NOTE — Consult Note (Addendum)
[facility] Consult: 9:16 AM 03/13/2022  LOS: 8 days    Referring Provider: Dr Si Raider.   Primary Care Physician:  Derek Jack, MD Primary Gastroenterologist:  Dr. Manus Rudd    Reason for Consultation:  Hematemesis.     HPI: Courtney Arnold is a 82 y.o. female. PMH below  04/2015 ERCP with sphincterotomy, stenting of CBD.  Dr Laural Golden noted small periampullary polyp, normal ampulla of Vater and tiny ampullary orifice.  Dilated CBD, dilated CHD, dilated intra hepatic radicles.  No filling defects encountered but distal CBD poorly visualized because of dense contrast within colon.  10 French, 7 cm plastic stent placed to CBD. 06/2015 ERCP. with removal of plastic biliary stent.  Indeterminant biliary stricture at lower main bile duct.  Mid and upper bile duct moderately dilated.  The stricture was not stented as the patient had refused repeat stent placement. 06/2015 EGD.  For nausea, vomiting, abdominal pain, weight loss, biliary stent removal.  Performed by Dr. Melony Overly.  LA grade B reflux esophagitis.  Irregular Z-line.  Antral and duodenal erythema.  Normal second duodenum.  Plastic stent in 2nd duodenum. 11/2015 colonoscopy.  Surveillance study for patient history colon polyps.  Tortuous colon.  Colonic diverticulosis in the sigmoid, descending.  No polyps, no mucosal abnormalities, no pathology collected.  Unable to retroflex of rectum     Presented to [facility] 1/15 with N/V and poor p.o. intake for at least 3 weeks.  Imaging revealed multifocal pneumonia, possible right lung abscess and bil pleural effusions.  Transferred to [facility] 1/16 to allow for pulmonary consult.  Developed A-fib, RVR treated with amiodarone, heparin.  Right thoracentesis 1/18.  Received 1 PRBC on 1/15 when her Hb was 7.  Note that  it was as low as 6.4 02/24/2022 and baseline are the eights, but up to 10.8 on 03/04/2022.   On 1/19 developed frequent, small, nonbloody/nonmelenic stools.  Persistent poor p.o. intake with malnutrition.  Yesterday, 1/22 had episode limited epistaxis.  Around 2 AM this morning patient developed acute hematemesis, vomiting up clots of blood and heparin drip discontinued, PPI drip initiated as she had already been receiving Protonix 40 IV every 12.  No further episodes of hematemesis.  No reports of bloody or melenic stool. Pt denies abdominal pain or nausea currently  Patient has had chronic hypotension currently pressures 90s/40s to 50s follow the previous range.  Tachycardia and currently in NSR.  Oxygen saturation upper 90s to 100% on 2 L nasal cannula oxygen. Hgb this a.m. 7.7, same as it was 3 days ago, it was 8 yesterday morning. Preserved renal function with BUN.  LFTs, lipase have  Lives in [place].  Widowed.  Requiring 2 person assist at home prior to admission.  No history of alcohol use.  Former smoker  Family history of breast cancer in her sister.  Father with heart attack and stroke.     Past Medical History:  Diagnosis Date   Atrial fibrillation [facility]) 2011   Postop, spontaneous conversion to normal sinus after one hour   CAP (community  acquired pneumonia) 09/23/2021   Cholecystitis, acute 04/25/2015   Compression fracture 07/24/2009   T12; kyphoplasty   History of echocardiogram 07/2009   EF 65%   Hypertension    Thyroid disease    Tobacco abuse     Past Surgical History:  Procedure Laterality Date   BACK SURGERY     BACK SURGERY  06/06/2015   BREAST EXCISIONAL BIOPSY Left    50 years ago  benign   CHOLECYSTECTOMY N/A 04/25/2015   Procedure: LAPAROSCOPIC CHOLECYSTECTOMY WITH INTRAOPERATIVE CHOLANGIOGRAM;  Surgeon: Mickeal Skinner, MD;  Location: [facility];  Service: General;  Laterality: N/A;   COLONOSCOPY N/A 11/26/2015   Procedure: COLONOSCOPY;  Surgeon:  Daneil Dolin, MD;  Location: [facility];  Service: Endoscopy;  Laterality: N/A;  7:30 am   ERCP N/A 04/14/2015   Procedure: ENDOSCOPIC RETROGRADE CHOLANGIOPANCREATOGRAPHY (ERCP) Biliary Sphincterotomy, 10x7 stent placement Dilated bilary system just not well seen;  Surgeon: Rogene Houston, MD;  Location: [facility];  Service: Endoscopy;  Laterality: N/A;   ERCP N/A 06/12/2015   Procedure: ENDOSCOPIC RETROGRADE CHOLANGIOPANCREATOGRAPHY (ERCP);  Surgeon: Rogene Houston, MD;  Location: [facility];  Service: Endoscopy;  Laterality: N/A;   ESOPHAGOGASTRODUODENOSCOPY N/A 06/12/2015   Procedure: DIAGNOSTIC ESOPHAGOGASTRODUODENOSCOPY (EGD);  Surgeon: Rogene Houston, MD;  [facility];  Service: Endoscopy;  Laterality: N/A;   FEMUR IM NAIL Right 05/11/2019   Procedure: RETROGRADE INTRAMEDULLARY NAIL FEMORAL;  Surgeon: Shona Needles, MD;  Location: [place];  Service: Orthopedics;  Laterality: Right;   STENT REMOVAL  06/12/2015   Procedure: STENT REMOVAL ;  Surgeon: Rogene Houston, MD;  [facility]: [facility];  Service: Endoscopy;;   THORACENTESIS N/A 03/23/2022   Procedure: Mathews Robinsons;  Surgeon: [name], MD;  Location: [place];  Service: Cardiopulmonary;  Laterality: N/A;    Prior to Admission medications   Medication Sig Start Date End Date Taking? Authorizing Provider  acyclovir (ZOVIRAX) 400 MG tablet TAKE (1) TABLET BY MOUTH TWICE DAILY. Patient taking differently: Take 400 mg by mouth 2 (two) times daily. 01/11/22  Yes Derek Jack, MD  CALCIUM PO Take 1 capsule by mouth daily.   Yes [provider]  carboxymethylcellulose (REFRESH PLUS) 0.5 % SOLN Place 1 drop into both eyes at bedtime.   Yes [provider]  cephALEXin (KEFLEX) 500 MG capsule Take 1 capsule (500 mg total) by mouth 2 (two) times daily. 03/17/22  Yes Derek Jack, MD  Cholecalciferol (VITAMIN D) 125 MCG (5000 UT) CAPS Take 5,000 Units by mouth daily. 05/14/19  Yes McClung, Sarah A,  PA-C  Cyanocobalamin (B-12 PO) Take 1 capsule by mouth daily.   Yes [provider]  dexamethasone (DECADRON) 4 MG tablet Take 2.5 tablets (10 mg total) by mouth 2 (two) times a week. 12/24/21  Yes Derek Jack, MD  levothyroxine (SYNTHROID) 137 MCG tablet Take 1 tablet (137 mcg total) by mouth daily before breakfast. 02/16/22  Yes Shamleffer, Melanie Crazier, MD  methylphenidate (RITALIN) 5 MG tablet Take 10 mg by mouth daily. Only on days she is not taking dexamethasone tue,thu,fri,sat,sun   Yes [provider]  montelukast (SINGULAIR) 10 MG tablet Take one tablet by mouth 1 hour prior to Darzalex injection appointments Patient taking differently: Take one tablet by mouth 1 hour prior to Darzalex injection appointments - given every other Wednesday 10/28/21  Yes Derek Jack, MD  Multiple Vitamin (MULTIVITAMIN WITH MINERALS) TABS tablet Take 1 tablet by mouth daily. 09/25/21  Yes Johnson, Clanford  L, MD  pomalidomide (POMALYST) 3 MG capsule Take 1 capsule (3 mg total) by mouth daily. Take for 21 days, then hold for 7 days. Repeat every 28 days. 03/12/22  Yes Derek Jack, MD  potassium chloride (KLOR-CON M) 10 MEQ tablet Take 2 tablets (20 mEq total) by mouth daily. 03/04/22  Yes Derek Jack, MD  traMADol (ULTRAM) 50 MG tablet Take 1 tablet (50 mg total) by mouth every 12 (twelve) hours as needed. 03/19/22  Yes Derek Jack, MD  ondansetron (ZOFRAN) 8 MG tablet Take 1 tablet (8 mg total) by mouth every 8 (eight) hours as needed for nausea or vomiting. Patient not taking: Reported on 03/17/2022 09/21/21   Derek Jack, MD    Scheduled Meds:  acyclovir  400 mg Oral BID   amiodarone  400 mg Oral BID   Chlorhexidine Gluconate Cloth  6 each Topical Daily   dextromethorphan-guaiFENesin  1 tablet Oral BID   [START ON 04/02/2022] levofloxacin  250 mg Oral Daily   levothyroxine  137 mcg Oral QAC breakfast   oxymetazoline  1 spray Each Nare BID    [START ON 04/02/2022] pantoprazole  40 mg Intravenous Q12H   sodium chloride flush  3 mL Intravenous Q12H   sodium chloride flush  3 mL Intravenous Q12H   Infusions:  sodium chloride     levofloxacin (LEVAQUIN) IV 750 mg (03/29/22 2207)   pantoprazole 8 mg/hr (04/04/2022 0322)   PRN Meds: sodium chloride, acetaminophen **OR** acetaminophen, albuterol, bisacodyl, loperamide, ondansetron **OR** ondansetron (ZOFRAN) IV, oxyCODONE, polyethylene glycol, polyvinyl alcohol, sodium chloride flush, traZODone   Allergies as of 03/31/2022   (No Known Allergies)    Family History  Problem Relation Age of Onset   Heart attack Father    Stroke Father    Breast cancer Sister    Thyroid disease Neg Hx    Colon cancer Neg Hx     Social History   Socioeconomic History   Marital status: Widowed    Spouse name: Not on file   Number of children: 2   Years of education: Not on file   Highest education level: Not on file  Occupational History   Occupation: retired  Tobacco Use   Smoking status: Former    Packs/day: 0.15    Years: 20.00    Total pack years: 3.00    Types: Cigarettes    Quit date: 03/08/2013    Years since quitting: 9.0   Smokeless tobacco: Never  Vaping Use   Vaping Use: Never used  Substance and Sexual Activity   Alcohol use: No    Alcohol/week: 0.0 standard drinks of alcohol   Drug use: No   Sexual activity: Not Currently  Other Topics Concern   Not on file  Social History Narrative   Active in gardens and does yard work.   Social Determinants of Health   Financial Resource Strain: Low Risk  (05/08/2019)   Overall Financial Resource Strain (CARDIA)    Difficulty of Paying Living Expenses: Not hard at all  Food Insecurity: No Food Insecurity (01/14/2022)   Hunger Vital Sign    Worried About Running Out of Food in the Last Year: Never true    Ran Out of Food in the Last Year: Never true  Transportation Needs: No Transportation Needs (01/14/2022)   PRAPARE -  Hydrologist (Medical): No    Lack of Transportation (Non-Medical): No  Physical Activity: Inactive (05/08/2019)   Exercise Vital Sign    Days  of Exercise per Week: 0 days    Minutes of Exercise per Session: 0 min  Stress: No Stress Concern Present (05/08/2019)   [place]    Feeling of Stress : Not at all  Social Connections: Moderately Isolated (02/06/2020)   Social Connection and Isolation Panel [NHANES]    Frequency of Communication with Friends and Family: More than three times a week    Frequency of Social Gatherings with Friends and Family: More than three times a week    Attends Religious Services: More than 4 times per year    Active Member of [employer] or Organizations: No    Attends Archivist Meetings: Never    Marital Status: Widowed  Intimate Partner Violence: Not At Risk (01/14/2022)   Humiliation, Afraid, Rape, and Kick questionnaire    Fear of Current or Ex-Partner: No    Emotionally Abused: No    Physically Abused: No    Sexually Abused: No    REVIEW OF SYSTEMS: Constitutional: Weakness ENT:  No nose bleeds Pulm: Denies shortness of breath or chest pain. CV:  No palpitations, no LE edema.  Denies chest pain GU:  No hematuria, no frequency GI: Denies dysphagia. Heme: No reports of excessive bruising or bleeding other than this taxis and hematemesis. Transfusions: Per HPI. Neuro:  No headaches, no peripheral tingling or numbness no syncope, no seizure. Derm:  No itching, no rash or sores.  Endocrine:  No sweats or chills.  No polyuria or dysuria Immunization: Reviewed. Travel:  None beyond local counties in last few months.    PHYSICAL EXAM: Vital signs in last 24 hours: Vitals:   03/27/2022 0723 03/19/2022 0853  BP: (!) 96/51 (!) 96/56  Pulse: 72   Resp: 20   Temp:  (!) 97.3 F (36.3 C)  SpO2: 99%    Wt Readings from Last 3 Encounters:  03/29/2022 57.3 kg   03/17/22 57.3 kg  03/04/22 60.4 kg    General: Patient is aged, appears severely chronically ill as well as acutely ill she is alert and comfortable in the bed. Head: No facial asymmetry or swelling. Eyes: No scleral icterus.  No conjunctival pallor. + Exophthalmos Ears: He is hearing deficits Nose: Dried blood at both nares.  No congestion or discharge Mouth: Dentures in place.  Mucosa mildly dry lesions or exudate.  Tongue midline Neck: No JVD, no masses, no Lungs: Diminished but especially on the right.  Adventitious sounds.  No cough.  Slight bruising visible beneath Band-Aid at site.  Right thoracentesis for tomorrow can you. Heart: RRR.  NSR Abdomen: Not tender or distended.  Active bowel sounds.  No HSM, masses, bruits,.   Rectal: Deferred. Musc/Skeltl: No joint redness, swelling or gross deformity.  Well-healed scar R knee appears surgical nature. Extremities: 1-2+ pitting pedal edema bilaterally. Neurologic: Stable oriented to place, self. responded  2004 for the year.  Follows commands.  No tremors.  Moves all 4 limbs.  Strength not tested.  However she is very weak and unable to move her torso without assistance. Skin: No telangiectasia rash or significant bruising. Nodes: No cervical adenopathy Psych: Calm, cooperative, flat affect.  Not agitated or anxious.  Intake/Output from previous day: 01/22 0701 - 01/23 0700 In: 100 [I.V.:100] Out: 310 [Urine:310] Intake/Output this shift: No intake/output data recorded.  LAB RESULTS: Recent Labs    03/28/22 1257 03/29/22 1140 03/10/2022 0352  WBC 4.7 4.4 3.5*  HGB 8.1* 8.0* 7.7*  HCT 25.1* 24.9*  23.9*  PLT 102* 92* 82*    BMET Lab Results  Component Value Date   NA 137 04/07/2022   NA 137 03/29/2022   NA 136 03/28/2022   K 4.3 03/11/2022   K 4.4 03/29/2022   K 4.5 03/28/2022   CL 110 03/29/2022   CL 110 03/29/2022   CL 110 03/28/2022   CO2 20 (L) 03/27/2022   CO2 20 (L) 03/29/2022   CO2 19 (L) 03/28/2022    GLUCOSE 97 03/11/2022   GLUCOSE 137 (H) 03/29/2022   GLUCOSE 132 (H) 03/28/2022   BUN 13 03/27/2022   BUN 13 03/29/2022   BUN 13 03/28/2022   CREATININE 0.92 03/28/2022   CREATININE 0.85 03/29/2022   CREATININE 0.86 03/28/2022   CALCIUM 6.9 (L) 03/24/2022   CALCIUM 6.9 (L) 03/29/2022   CALCIUM 6.9 (L) 03/28/2022   LFT No results for input(s): "PROT", "ALBUMIN", "AST", "ALT", "ALKPHOS", "BILITOT", "BILIDIR", "IBILI" in the last 72 hours. PT/INR Lab Results  Component Value Date   INR 1.0 01/14/2022   INR 1.1 09/27/2021   INR 1.0 05/11/2019   Hepatitis Panel No results for input(s): "HEPBSAG", "HCVAB", "HEPAIGM", "HEPBIGM" in the last 72 hours. C-Diff No components found for: "CDIFF" Lipase     Component Value Date/Time   LIPASE 22 03/23/2022 7017    Drugs of Abuse  No results found for: "LABOPIA", "COCAINSCRNUR", "LABBENZ", "AMPHETMU", "THCU", "LABBARB"   RADIOLOGY STUDIES: VAS [place] LOWER EXTREMITY VENOUS (DVT)  Result Date: 03/29/2022      Summary: RIGHT: - There is no evidence of deep vein thrombosis in the lower extremity.  - No cystic structure found in the popliteal fossa.  LEFT: - No evidence of common femoral vein obstruction.  *See table(s) above for measurements and observations. Electronically signed by Servando Snare MD on 03/29/2022 at 7:48:34 PM.    Final       IMPRESSION:     N/V and hematemesis.  Issues with nonbloody nausea vomiting PTA had improved until yesterday.  Thank you episode of limited epistaxis yesterday afternoon so possibility of right epistaxis and also [place] hematemesis.  Since admission receiving Protonix 40 IV twice daily, switched to Protonix gtt. early this morning.  No PPI/H2B on home med list.    Frequent small stools starting 1/19. No stool studies since.  Exposure to multiple antibiotics both before and during this hospitalization.    Macrocytic anemia.  B12, folate, iron ok in summer 2023.  1 PRBC on 1/15.      Multiple myeloma.  Refractory/relapsed. Treatment w velcade, pomalidomide, dexamethasone.      PAF.  No AC PTA.  New Afib RVR.  Po amiodarone, SQ Lovenox once on 1/22, Heparin bolus on 1/17, infusion on 1/21 through 1230AM this morning (8 to 9 hours) now dc'd.       Thrombocytopenia.  Chronic.  Lows in 20s 02/2022.      Resp failure. COPD.  Multifocal PNA, lung abscesses. ID Suspects CAP rather than exotic organism.  Pulm nodules.  200 ml R thoracentesis 1/18 w small ptx.  No growth on clx. No malignancy on cytology.  ID following.  Levaquin    hypocalcemia.  Ca level still slightly low corrected dor low albumin.      Pt w Compromised medical decsion making   PLAN:      Spoke w pt dtr Rigoberto Noel.  She is fine w proceeding to EGD and understands she is higher risk for compications given her PNA and  general frail health.     Azucena Freed  04/06/2022, 9:16 AM Phone [phone]   Attending physician's note  I have taken a history, reviewed the chart and examined the patient. I performed a substantive portion of this encounter, including complete performance of at least one of the key components, in conjunction with the APP. I agree with the APP's note, impression and recommendations.    65 yr F with multifocal pneumonia, R lung abscess, pleural effusions bilateral, recent onset A-fib with RVR, iatrogenic pneumothorax s/p Thora developed hematemesis last night She has been having intermittent nausea and vomiting, epistaxis.  No history of melena or rectal bleeding Heparin gtt on hold since midnight NPO PPI Will proceed with EGD for further evaluation of hematemesis Exclude Mallory-Weiss tear, erosive esophagitis or gastroduodenal ulcer  The risks and benefits as well as alternatives of endoscopic procedure(s) have been discussed and reviewed. All questions answered. The patient and family agrees to proceed.     Damaris Hippo , MD [phone]

## 2022-03-30 NOTE — Progress Notes (Signed)
   03/16/2022 1608  Urine Characteristics  Bladder Scan Volume (mL) 362 mL  Intermittent/Straight Cath (mL) 600 mL  Intermittent Catheter Size 16

## 2022-03-31 ENCOUNTER — Inpatient Hospital Stay (HOSPITAL_COMMUNITY): Payer: Medicare Other

## 2022-03-31 ENCOUNTER — Ambulatory Visit: Payer: Medicare Other

## 2022-03-31 ENCOUNTER — Inpatient Hospital Stay: Payer: Medicare Other

## 2022-03-31 ENCOUNTER — Ambulatory Visit: Payer: Medicare Other | Admitting: Hematology

## 2022-03-31 DIAGNOSIS — Z87891 Personal history of nicotine dependence: Secondary | ICD-10-CM | POA: Diagnosis not present

## 2022-03-31 DIAGNOSIS — E039 Hypothyroidism, unspecified: Secondary | ICD-10-CM

## 2022-03-31 DIAGNOSIS — I480 Paroxysmal atrial fibrillation: Secondary | ICD-10-CM | POA: Diagnosis not present

## 2022-03-31 DIAGNOSIS — C9000 Multiple myeloma not having achieved remission: Secondary | ICD-10-CM | POA: Diagnosis not present

## 2022-03-31 DIAGNOSIS — M7989 Other specified soft tissue disorders: Secondary | ICD-10-CM | POA: Diagnosis not present

## 2022-03-31 DIAGNOSIS — Z7189 Other specified counseling: Secondary | ICD-10-CM | POA: Diagnosis not present

## 2022-03-31 DIAGNOSIS — D696 Thrombocytopenia, unspecified: Secondary | ICD-10-CM

## 2022-03-31 DIAGNOSIS — J851 Abscess of lung with pneumonia: Secondary | ICD-10-CM | POA: Diagnosis not present

## 2022-03-31 DIAGNOSIS — D61818 Other pancytopenia: Secondary | ICD-10-CM | POA: Diagnosis not present

## 2022-03-31 DIAGNOSIS — J189 Pneumonia, unspecified organism: Secondary | ICD-10-CM | POA: Diagnosis not present

## 2022-03-31 LAB — CBC
HCT: 25.8 % — ABNORMAL LOW (ref 36.0–46.0)
Hemoglobin: 8.3 g/dL — ABNORMAL LOW (ref 12.0–15.0)
MCH: 32.5 pg (ref 26.0–34.0)
MCHC: 32.2 g/dL (ref 30.0–36.0)
MCV: 101.2 fL — ABNORMAL HIGH (ref 80.0–100.0)
Platelets: 106 10*3/uL — ABNORMAL LOW (ref 150–400)
RBC: 2.55 MIL/uL — ABNORMAL LOW (ref 3.87–5.11)
RDW: 22.5 % — ABNORMAL HIGH (ref 11.5–15.5)
WBC: 3.6 10*3/uL — ABNORMAL LOW (ref 4.0–10.5)
nRBC: 0.8 % — ABNORMAL HIGH (ref 0.0–0.2)

## 2022-03-31 LAB — BASIC METABOLIC PANEL
Anion gap: 8 (ref 5–15)
BUN: 13 mg/dL (ref 8–23)
CO2: 21 mmol/L — ABNORMAL LOW (ref 22–32)
Calcium: 6.7 mg/dL — ABNORMAL LOW (ref 8.9–10.3)
Chloride: 109 mmol/L (ref 98–111)
Creatinine, Ser: 0.97 mg/dL (ref 0.44–1.00)
GFR, Estimated: 59 mL/min — ABNORMAL LOW (ref >60)
Glucose, Bld: 125 mg/dL — ABNORMAL HIGH (ref 70–99)
Potassium: 4.3 mmol/L (ref 3.5–5.1)
Sodium: 138 mmol/L (ref 135–145)

## 2022-03-31 MED ORDER — FLUCONAZOLE 100 MG PO TABS
100.0000 mg | ORAL_TABLET | Freq: Every day | ORAL | Status: DC
  Administered 2022-03-31: 100 mg via ORAL
  Filled 2022-03-31: qty 1

## 2022-03-31 MED ORDER — FLUCONAZOLE 200 MG PO TABS
200.0000 mg | ORAL_TABLET | Freq: Every day | ORAL | Status: DC
  Administered 2022-04-01: 200 mg via ORAL
  Filled 2022-03-31 (×2): qty 1

## 2022-03-31 MED ORDER — SODIUM CHLORIDE 0.9 % IV BOLUS
500.0000 mL | Freq: Once | INTRAVENOUS | Status: AC
  Administered 2022-03-31: 500 mL via INTRAVENOUS

## 2022-03-31 MED ORDER — PANTOPRAZOLE SODIUM 40 MG IV SOLR
40.0000 mg | Freq: Two times a day (BID) | INTRAVENOUS | Status: DC
  Administered 2022-03-31 – 2022-04-05 (×9): 40 mg via INTRAVENOUS
  Filled 2022-03-31 (×10): qty 10

## 2022-03-31 MED ORDER — HEPARIN (PORCINE) 25000 UT/250ML-% IV SOLN
950.0000 [IU]/h | INTRAVENOUS | Status: DC
  Administered 2022-03-31 – 2022-04-01 (×2): 1000 [IU]/h via INTRAVENOUS
  Filled 2022-03-31 (×2): qty 250

## 2022-03-31 NOTE — Progress Notes (Signed)
   03/31/22 1400  Assess: MEWS Score  Temp (!) 97.5 F (36.4 C)  BP (!) 97/58  MAP (mmHg) 71  Pulse Rate 73  ECG Heart Rate 73  Resp (!) 26  SpO2 100 %  Assess: MEWS Score  MEWS Temp 0  MEWS Systolic 1  MEWS Pulse 0  MEWS RR 2  MEWS LOC 0  MEWS Score 3  MEWS Score Color Yellow  Assess: if the MEWS score is Yellow or Red  Were vital signs taken at a resting state? Yes  Focused Assessment No change from prior assessment  Does the patient meet 2 or more of the SIRS criteria? No  Does the patient have a confirmed or suspected source of infection? No  Provider and Rapid Response Notified? No  MEWS guidelines implemented *See Row Information* Yes  Treat  Pain Scale 0-10  Pain Score 0  Take Vital Signs  Increase Vital Sign Frequency  Yellow: Q 2hr X 2 then Q 4hr X 2, if remains yellow, continue Q 4hrs  Escalate  MEWS: Escalate Yellow: discuss with charge nurse/RN and consider discussing with provider and RRT  Notify: Charge Nurse/RN  Date Charge Nurse/RN Notified 03/31/22  Time Charge Nurse/RN Notified 1530  Document  Patient Outcome Other (Comment)  Progress note created (see row info) Yes  Assess: SIRS CRITERIA  SIRS Temperature  0  SIRS Pulse 0  SIRS Respirations  1  SIRS WBC 1  SIRS Score Sum  2

## 2022-03-31 NOTE — Progress Notes (Addendum)
Daily Progress Note   Patient Name: Courtney Arnold       Date: 03/31/2022 DOB: [birth year]  Age: 82 y.o. MRN#: [id number] Attending Physician: Kerney Elbe, DO Primary Care Physician: Derek Jack, MD Admit Date: 03/18/2022  Reason for Consultation/Follow-up: Establishing goals of care  Patient Profile/HPI:  82 y.o. female  with past medical history of multiple myeloma not in remission, active with Dr. Delton Coombes- was on decadron and polymyst which is now on hold due to her infections, significant pancytopenia with anemia and thrombocytopenia, COPD, A-fib postop with spontaneous conversion, HTN, thyroid disease, compression fracture T12 with kyphoplasty 2011, right femur IM nailing 2021, admitted on 03/17/2022 with lung abscess multi focal pneumonia with abscess formation. Recovery has been complicated by a-fib, thrush, epistaxis and hematemesis. EGD completed on 1/23 with findings of thrush, and duodenal mass- awaiting biopsy. Seen by ID- to complete antibiotic course on 1/25.    Subjective: Met with patient's two daughters at the bedside per their request.  [name] able to tell me her name is "Courtney Arnold" and she wants a "burger" for dinner. She denied pain or discomfort.  Answered their questions regarding patient's current acute and chronic illnesses and possible trajectories. Discussed concerns of poor oral intake- per family she is very picky about what she will eat and typically prefers to eat smaller multiple meals throughout the day.  Options of continued aggressive medical care vs transition to comfort care and hospice discussed. Discussed transition to comfort measures look like- stopping IV fluids, antibiotics, labs and providing symptom management for SOB, anxiety, nausea,  vomiting, and other symptoms of dying. Discussed Hospice services and philosophies of care.  Daughters don't want their Mom to suffer. They do realize she has multiple issues which affect one another that make it difficult for her to recover.      Physical Exam Vitals and nursing note reviewed.  Constitutional:      Appearance: She is ill-appearing.     Comments: lethargic  HENT:     Nose:     Comments: Dried blood  Pulmonary:     Effort: Pulmonary effort is normal.  Skin:    Coloration: Skin is pale.             Vital Signs: BP 99/60   Pulse 73   Temp (!) 97.5  F (36.4 C)   Resp (!) 27   Ht 5' 2.01" (1.575 m)   Wt 57.3 kg   SpO2 100%   BMI 23.10 kg/m  SpO2: SpO2: 100 % O2 Device: O2 Device: Nasal Cannula O2 Flow Rate: O2 Flow Rate (L/min): 2 L/min  Intake/output summary:  Intake/Output Summary (Last 24 hours) at 03/31/2022 1648 Last data filed at 03/31/2022 1300 Gross per 24 hour  Intake 255.69 ml  Output 450 ml  Net -194.31 ml   LBM: Last BM Date : 03/28/2022 Baseline Weight: Weight: 57.3 kg Most recent weight: Weight: 57.3 kg       Palliative Assessment/Data: PPS: 30%      Patient Active Problem List   Diagnosis Date Noted   Postprocedural pneumothorax 03/27/2022   Pleural effusion 03/09/2022   Lung abscess ([place]) 03/14/2022   Multifocal pneumonia with Lung Abscess 03/24/2022   Paroxysmal atrial fibrillation with RVR ([place]) 03/24/2022   DNR (do not resuscitate)/DNI(Do Not Intubate) 01/13/2022   Epistaxis 09/27/2021   Acute blood loss anemia 09/27/2021   Thrombocytopenia ([place]) 09/24/2021   Symptomatic anemia 09/22/2021   AKI (acute kidney injury) ([place]) 09/22/2021   Pulmonary infiltrates 09/22/2021   Pancytopenia, acquired -Multiple Myeloma Related 09/17/2020   Multiple myeloma not having achieved remission ([place]) 06/07/2019   Goals of care, counseling/discussion 06/07/2019   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   Vitamin B 12 deficiency 05/22/2019    Chronic anxiety 05/22/2019   Closed fracture of right femur ([place]) 05/10/2019   Hypothyroidism 05/10/2019   Unspecified atrial fibrillation ([place]) 05/10/2019   Anxiety 05/10/2019   Anemia, unspecified 05/10/2019   Sacral lytic lesions 05/10/2019   Bone lesion 05/08/2019   Osteoarthritis of knee 04/06/2019   Common bile duct dilation 09/01/2017   Lower back pain 09/01/2017   Vitamin D deficiency 02/03/2016   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   History of colonic polyps 11/06/2015   Thyroid-related proptosis 06/25/2015   History of biliary stent insertion 04/21/2015   AP (abdominal pain)    Compression fracture of L2 lumbar vertebra (H[facility])    Abdominal pain 04/12/2015   TOBACCO ABUSE 08/21/2009   ATRIAL FIBRILLATION, HX OF 08/21/2009   Tobacco abuse 08/21/2009    Palliative Care Assessment & Plan    Assessment/Recommendations/Plan  Continue current plan of care Family hopeful for rehab- discussed option of Palliative at rehab and transition to hospice if she has further decline in rehab PMT will followup with family on Saturday or earlier if she has decompensation Recommend liberalizing diet and asking for her preferences once she is cleared of liquid diet Recommend oxygen be humidified to help with nosebleeding   Code Status: DNR  Prognosis:  Unable to determine  Discharge Planning: To Be Determined  Care plan was discussed with patient's daughters at the bedside.   Thank you for allowing the Palliative Medicine Team to assist in the care of this patient.  Total time:  90 minutes  Greater than 50%  of this time was spent counseling and coordinating care related to the above assessment and plan.  Mariana Kaufman, AGNP-C Palliative Medicine   Please contact Palliative Medicine Team phone at [phone] for questions and concerns.

## 2022-03-31 NOTE — Progress Notes (Signed)
Left upper extremity venous duplex has been completed. Preliminary results can be found in CV Proc through chart review.  Results were given to the patient's nurse, Claiborne Billings.  03/31/22 2:18 PM Carlos Levering RVT

## 2022-03-31 NOTE — Progress Notes (Signed)
ANTICOAGULATION CONSULT NOTE - Initial Consult  Pharmacy Consult for Heparin Indication: atrial fibrillation  No Known Allergies  Patient Measurements: Height: 5' 2.01" (157.5 cm) Weight: 57.3 kg (126 lb 5.2 oz) IBW/kg (Calculated) : 50.12 Heparin Dosing Weight: 57 kg  Vital Signs: Temp: 97.5 F (36.4 C) (01/24 1400) Temp Source: Axillary (01/24 1214) BP: 99/60 (01/24 1600) Pulse Rate: 73 (01/24 1400)  Labs: Recent Labs    03/28/22 2131 03/29/22 1140 03/29/22 1140 03/12/2022 0352 03/29/2022 0426 03/23/2022 1411 03/31/22 0726  HGB  --  8.0*   < > 7.7*  --  7.7* 8.3*  HCT  --  24.9*   < > 23.9*  --  25.6* 25.8*  PLT  --  92*  --  82*  --   --  106*  APTT  --   --   --   --  39*  --   --   HEPARINUNFRC 0.10* 0.47  --   --  0.36  --   --   CREATININE  --  0.85  --   --  0.92  --  0.97   < > = values in this interval not displayed.    Estimated Creatinine Clearance: 36.6 mL/min (by C-G formula based on SCr of 0.97 mg/dL).   Medical History: Past Medical History:  Diagnosis Date   Atrial fibrillation ([place]) 2011   Postop, spontaneous conversion to normal sinus after one hour   CAP (community acquired pneumonia) 09/23/2021   Cholecystitis, acute 04/25/2015   Compression fracture 07/24/2009   T12; kyphoplasty   History of echocardiogram 07/2009   EF 65%   Hypertension    Thyroid disease    Tobacco abuse     Medications:  Medications Prior to Admission  Medication Sig Dispense Refill Last Dose   acyclovir (ZOVIRAX) 400 MG tablet TAKE (1) TABLET BY MOUTH TWICE DAILY. (Patient taking differently: Take 400 mg by mouth 2 (two) times daily.) 60 tablet 6 03/21/2022   CALCIUM PO Take 1 capsule by mouth daily.   03/21/2022   carboxymethylcellulose (REFRESH PLUS) 0.5 % SOLN Place 1 drop into both eyes at bedtime.   03/21/2022   cephALEXin (KEFLEX) 500 MG capsule Take 1 capsule (500 mg total) by mouth 2 (two) times daily. 10 capsule 0 03/21/2022   Cholecalciferol (VITAMIN D) 125  MCG (5000 UT) CAPS Take 5,000 Units by mouth daily. 90 capsule 1 03/21/2022   Cyanocobalamin (B-12 PO) Take 1 capsule by mouth daily.   03/21/2022   dexamethasone (DECADRON) 4 MG tablet Take 2.5 tablets (10 mg total) by mouth 2 (two) times a week. 20 tablet 4 Past Week   levothyroxine (SYNTHROID) 137 MCG tablet Take 1 tablet (137 mcg total) by mouth daily before breakfast. 90 tablet 0 03/21/2022   methylphenidate (RITALIN) 5 MG tablet Take 10 mg by mouth daily. Only on days she is not taking dexamethasone tue,thu,fri,sat,sun   03/21/2022   montelukast (SINGULAIR) 10 MG tablet Take one tablet by mouth 1 hour prior to Darzalex injection appointments (Patient taking differently: Take one tablet by mouth 1 hour prior to Darzalex injection appointments - given every other Wednesday) 30 tablet 2 unk   Multiple Vitamin (MULTIVITAMIN WITH MINERALS) TABS tablet Take 1 tablet by mouth daily.   03/21/2022   pomalidomide (POMALYST) 3 MG capsule Take 1 capsule (3 mg total) by mouth daily. Take for 21 days, then hold for 7 days. Repeat every 28 days. 21 capsule 0 Past Month   potassium chloride (KLOR-CON M)  10 MEQ tablet Take 2 tablets (20 mEq total) by mouth daily. 60 tablet 3 03/21/2022   traMADol (ULTRAM) 50 MG tablet Take 1 tablet (50 mg total) by mouth every 12 (twelve) hours as needed. 30 tablet 0 03/18/2022    Assessment: 82 y.o. F on heparin->Lovenox for afib 1/17-1/23 at which time pt had hematemesis so reversed with protamine. Pt also with nosebleed 1/23 and GI felt that hematemesis was from nosebleed. No reoccurrence of bleeding since. Hgb 8.3 today (low but stable) and plt 106 (stable). Pharmacy consulted to restart heparin.  Goal of Therapy:  Heparin level 0.3-0.5 units/ml (aim for lower goal with recent bleeding) Monitor platelets by anticoagulation protocol: Yes   Plan:  Heparin gtt at 1000 units/hr. No bolus Will f/u heparin level in 8 hours Daily heparin level and CBC F/u closely for any  bleeding  Sherlon Handing, PharmD, BCPS Please see amion for complete clinical pharmacist phone list 03/31/2022,4:15 PM

## 2022-03-31 NOTE — Progress Notes (Signed)
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 Nodules are sub-cm without halo/reverse halo sign. I suspect this is community acquired pna rather than exotic organism such as pjp/nocardia. MM without stem cell transplant are usually not a host for invasive mold infection.    There is some improvement with cough/abd pain, but too early to tell   This is her second pneumonia. 09/2021 had pna as well and resolved with short CAP tx course   Had pleural effusion right sided -- diagnostic 1/18 showed transudative appearance  ---------- 1/24 assessment Appears n/v/diarrhea remains an issue Also very debilitated and poor intake Stable o2 demand 2 liters via [place] being in bed  Course recently complicated by epistaxis -- egd done showed distal esophageal plaque/candida and ?duodenal mass (biopsied)  MM overall poor prognosis, and given gi sx, we had decided recently against the original planned 3 months prophylaxis with levoflox and indefinite bactrim pjp prophylaxis     Plan: Finish 10 days of pna tx on 1/25 (this gives her 7 day atypical coverage with levoflox) Continue acyclovir GI started fluconazole -- we can increase to 200 mg daily to finish 3 week course on 04/21/22 Await goals of care discussion Discussed with primary team    I spent 75 minute reviewing data/chart, and  coordinating care and >50% direct face to face time providing counseling/discussing diagnostics/treatment plan with patient   Principal Problem:   Multifocal pneumonia with Lung Abscess Active Problems:   Hypothyroidism following radioiodine therapy   Hypothyroidism   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[place])   Multiple myeloma not having achieved remission ([place])   Pancytopenia, acquired -Multiple Myeloma Related   Symptomatic anemia   Thrombocytopenia (H[facility])   Lung abscess (H[facility])   Paroxysmal atrial fibrillation with RVR (H[facility])   Pleural effusion   Postprocedural pneumothorax   No Known Allergies  Scheduled Meds:  acyclovir  400 mg Oral BID   amiodarone  400 mg Oral BID   Chlorhexidine Gluconate Cloth  6 each Topical Daily   dextromethorphan-guaiFENesin  1 tablet Oral BID   fluconazole  100 mg Oral Daily   levothyroxine  137 mcg Oral QAC breakfast   oxymetazoline  1 spray Each Nare BID   [START ON 04/02/2022] pantoprazole  40 mg Intravenous Q12H   sodium chloride flush  3 mL Intravenous Q12H   sodium chloride flush  3 mL Intravenous Q12H   Continuous Infusions:  sodium chloride 10 mL/hr at 03/31/22 0513   levofloxacin (LEVAQUIN) IV 750 mg (03/29/22 2207)   pantoprazole 8 mg/hr (03/10/2022 1535)   PRN Meds:.sodium chloride, acetaminophen **OR** acetaminophen, albuterol, bisacodyl, loperamide, ondansetron **OR** ondansetron (ZOFRAN) IV, oxyCODONE, polyethylene glycol, polyvinyl alcohol, sodium chloride flush, traZODone   SUBJECTIVE: Stable o2 demand 2  liters Overall still mostly bedbound, poor intake N/v/diarrhea and some epitaxis; egd done 1/23 with esophageal candida and ?duodenal mass; started on fluconazole  Afebrile No leukocytosis -- baseline pancytopenia    Review of Systems: ROS All other ROS was negative, except mentioned above     OBJECTIVE: Vitals:   03/31/22 0348 03/31/22 0718 03/31/22 0745 03/31/22 1214  BP: (!) 107/57 (!) 107/47  (!) 98/48  Pulse: 75 77 73 71   Resp: (!) 22 (!) 28 (!) 21 20  Temp: (!) 97.3 F (36.3 C) 97.6 F (36.4 C)  (!) 96 F (35.6 C)  TempSrc: Oral Oral  Axillary  SpO2: 98% 98% 96% 100%  Weight:      Height:       Body mass index is 23.1 kg/m.  Physical Exam General/constitutional: no distress, pleasant HEENT: Normocephalic, PER, Conj Clear, EOMI, Oropharynx clear Neck supple CV: rrr no mrg Lungs: clear to auscultation, normal respiratory effort Abd: Soft, Nontender Ext: no edema Skin: No Rash Neuro: nonfocal MSK: no peripheral joint swelling/tenderness/warmth; back spines nontender          Lab Results Lab Results  Component Value Date   WBC 3.6 (L) 03/31/2022   HGB 8.3 (L) 03/31/2022   HCT 25.8 (L) 03/31/2022   MCV 101.2 (H) 03/31/2022   PLT 106 (L) 03/31/2022    Lab Results  Component Value Date   CREATININE 0.97 03/31/2022   BUN 13 03/31/2022   NA 138 03/31/2022   K 4.3 03/31/2022   CL 109 03/31/2022   CO2 21 (L) 03/31/2022    Lab Results  Component Value Date   ALT 11 03/26/2022   AST 12 (L) 03/26/2022   ALKPHOS 50 03/26/2022   BILITOT 1.1 03/26/2022      Microbiology: Recent Results (from the past 240 hour(s))  Culture, blood (routine x 2)     Status: None   Collection Time: 03/11/2022 11:19 AM   Specimen: BLOOD RIGHT HAND  Result Value Ref Range Status   Specimen Description BLOOD RIGHT HAND  Final   Special Requests   Final    BOTTLES DRAWN AEROBIC ONLY Blood Culture results may not be optimal due to an inadequate volume of blood received in culture bottles   Culture   Final    NO GROWTH 5 DAYS Performed at [facility], [address] [place], [place] [address]    Report Status 03/27/2022 FINAL  Final  Culture, blood (routine x 2)     Status: None   Collection Time: 03/11/2022 12:22 PM   Specimen: BLOOD  Result Value Ref Range Status   Specimen Description BLOOD  Final   Special Requests NONE BLOOD RIGHT ARM  Final   Culture   Final    NO GROWTH 5 DAYS Performed at  [facility], [address], [place], [place] [address]    Report Status 03/27/2022 FINAL  Final  MRSA Next Gen by PCR, Nasal     Status: None   Collection Time: 03/24/2022  2:27 PM   Specimen: Nasal Mucosa; Nasal Swab  Result Value Ref Range Status   MRSA by PCR Next Gen NOT DETECTED NOT DETECTED Final    Comment: (NOTE) The GeneXpert MRSA Assay (FDA approved for NASAL specimens only), is one component of a comprehensive MRSA colonization surveillance program. It is not intended to diagnose MRSA infection nor to guide or monitor treatment for MRSA infections. Test performance is not FDA approved in patients less than 74 years old. Performed at  [facility] [place], [place] [address]   Body fluid culture w Gram Stain     Status: None   Collection Time: 03/21/2022  3:07 PM   Specimen: Thoracentesis; Body Fluid  Result Value Ref Range Status   Specimen Description THORACENTESIS  Final   Special Requests NONE  Final   Gram Stain   Final    WBC PRESENT,BOTH PMN AND MONONUCLEAR NO ORGANISMS SEEN CYTOSPIN SMEAR    Culture   Final    NO GROWTH 3 DAYS Performed at [facility], [address] [place], [place] [address]    Report Status 03/29/2022 FINAL  Final     Serology:   Imaging: If present, new imagings (plain films, ct scans, and mri) have been personally visualized and interpreted; radiology reports have been reviewed. Decision making incorporated into the Impression / Recommendations.  1/19 cxr 1. Stable small right hydropneumothorax. 2. Stable patchy consolidation throughout the right greater than left lungs, most prominent in the right perihilar lung, compatible with multilobar pneumonia.     1/18 chest xray FINDINGS: The heart size and mediastinal contours are partially obscured but appear borderline enlarged.   Moderate right and small left pleural effusions. Right middle lobe airspace consolidation. Additional patchy bilateral  airspace consolidations.   Prior vertebral body augmentation.   IMPRESSION: Multifocal pneumonia with parapneumonic effusions.     1/15 chest ct without contrast 1. Interval development of multifocal new nodules and consolidation, predominantly involving the right middle lobe, where there is a gas-containing fluid collection measuring 2.2 x 1.6 cm in the base of the right middle lobe. Findings are most consistent with multifocal pneumonia with abscess formation. 2. Near-complete resolution of previously noted left upper lobe consolidation. 3. Moderate right and trace left pleural effusions. 4. Diffusely heterogeneous appearance of the skeleton in keeping with known multiple myeloma. 5. Coronary artery calcifications. Aortic Atherosclerosis     1/15 tte  1. Left ventricular ejection fraction, by estimation, is 55 to 60%. The  left ventricle has normal function. Left ventricular endocardial border  not optimally defined to evaluate regional wall motion. Left ventricular  diastolic function could not be  evaluated.   2. Right ventricular systolic function is normal. The right ventricular  size is mildly enlarged. There is moderately elevated pulmonary artery  systolic pressure. The estimated right ventricular systolic pressure is  46.5 mmHg.   3. Left atrial size was severely dilated.   4. Right atrial size was severely dilated.   5. The mitral valve is abnormal. Mild mitral valve regurgitation. No  evidence of mitral stenosis.   6. Tricuspid valve regurgitation is mild to moderate.   7. The aortic valve was not well visualized. Aortic valve regurgitation  is trivial. No aortic stenosis is present.   8. The inferior vena cava is normal in size with <50% respiratory  variability, suggesting right atrial pressure of 8 mmHg.   Comparison(s): No prior Echocardiogram.      Prior admission: 09/2021 chest ct -Multifocal upper lobe pneumonia, left greater than right.  Associated small left pleural effusion. Consider follow-up chest radiographs in 4-6 weeks to document clearance. -Multiple lytic lesions, compatible with the patient's known multiple myeloma.    Jabier Mutton, [facility] [place] [phone] pager    03/31/2022, 12:20 PM

## 2022-03-31 NOTE — Progress Notes (Signed)
PROGRESS NOTE    Courtney Arnold  BOF:[id number] DOB: Jan 13, 1941 DOA: 03/09/2022 PCP: Courtney Jack, MD   Brief Narrative:  The patient is an 82 year old female with multiple myeloma, paroxysmal atrial fibrillation, COPD, pancytopenia required packed red blood cell transfusion as well as platelet transfusion, hypothyroidism as well as other comorbidities who presented to [facility] on 03/14/2022, vomiting and poor oral intake for last 3 weeks.  She underwent further imaging and CT chest abdomen pelvis revealed multifocal pneumonia and possible abscess in the right middle lobe with moderate right and small left pleural effusions.  She was transferred to [facility] on 03/23/2022 at the recommendation of pulmonary to obtain a pulmonary consult and she developed A-fib with RVR and started on amiodarone infusion.  On 03/24/2022 pulmonary recommended holding off bronchial containing antibiotics and switch IV amiodarone to oral  03/31/2022 she underwent a thoracentesis of the right pleural space with 200 cc of cloudy fluid obtained and she had a small iatrogenic pneumothorax.  ID was consulted she started on levofloxacin and there was a discussion with her oncologist as below  On 03/26/2022 she started having frequent small stools which continued and on 03/28/2022 heparin was resumed and Imodium frequency was increased.  On 03/09/2022 she had an episode of hematemesis and protamine given and anticoagulation was stopped.  On 03/28/2022 she is felt not to have capacity make her own medical decisions and goals of care discussion was held with her daughters and the attending physician at that time.  Attending physician Dr. Wynelle Arnold felt that she had a very poor prognosis and was still not able to eat and having multiple loose stools along with her multiple myeloma, pancytopenia as well as new onset A-fib with RVR as well as pneumonia and lung abscess and transudative pleural effusion and slight  pneumothorax that she would have a poor outcome.  Family did not want to transition her to comfort care and wanted to treat the treatable.  Family felt that she could not go home.  After her episode of hematemesis she did have a nosebleed and GI scoped the patient and felt that the bleeding was from her nose given her history of epistaxis.  GI has since signed off the case but did find esophageal plaques suspicious for candidiasis and so she was started on fluconazole and the ID team increase this to 200 mg daily.   Assessment and Plan: No notes have been filed under this hospital service. Service: Hospitalist  Aspiration from vomiting at home but cannot rule out other infections as she is immune compromised -CT reveals bilateral multifocal nodules with consolidation in the right middle lobe, right moderate pleural effusion and small left pleural effusion-near complete resolution of previously noted left upper lobe consolidation  -ID has evaluated the patient and recommended oral levaquin and have now placed a stop date for this. Given ongoing loose stools they advise continuing through 1/25 and then trial off it -Continue with albuterol 2.5 mg nebs every 2 as needed for wheezing and shortness of breath, dextromethorphan-guaifenesin 1 tab p.o. twice daily, -Will add Flutter valve and incentive spirometry -Continue monitor respiratory status carefully and repeat chest x-ray in a.m. Courtney Arnold was following and recommended antibiotics for 14 days as well as aspiration precautions and pulmonary hygiene -Pulmonary recommends continuing goals of care discussion and recommending obtaining a chest x-ray if respiratory status worsens   Epistaxis causing Hematemesis  -Has hx of nosebleeds and had one yesterday that was minor and resolved spontaneously.  Yesterday morning RN reported vomiting up blood after being given water. Has not had recurrence. Hgb 7.7 today from 8 yesterday. -GI evaluated and was n.p.o.  yesterday for EGD and now on a clear liquid diet; EGD was done and there is no obvious source in the upper GI tract for bleeding or hematemesis and likely the source of bleeding from the posterior oropharynx and epistaxis -Now off lovenox, protamine given -Will try to wean off o2 as likely contributes to nosebleed. If unable to do so will plan on humidified air. Will also continue Oxymetazoline 1 spray each nare BID  -Hgb/Hct Trend: Recent Labs  Lab 03/26/22 0205 03/27/22 0117 03/28/22 1257 03/29/22 1140 03/29/2022 0352 03/29/2022 1411 03/31/22 0726  HGB 8.3* 7.7* 8.1* 8.0* 7.7* 7.7* 8.3*  HCT 25.0* 23.5* 25.1* 24.9* 23.9* 25.6* 25.8*  MCV 96.9 98.3 100.0 100.4* 103.5*  --  101.2*  -Was on PPI gtt but will go to q12h -FOBT was Positive  -If she continues to currently have epistaxis we will consult ENT for further evaluation and recommendations  Duodenal Mass -Large polypoid mass with no bleeding was found in the second portion of the duodenum and biopsies were taken with cold forceps for histology and these are currently pending   Iatrogenic right pneumothorax Acute Hypoxic respiratory failure - Post thoracentesis-  improving  -SpO2: 100 % O2 Flow Rate (L/min): 2 L/min FiO2 (%): 50 % -Currently on levofloxacin for antibiotic coverage -Continue with albuterol 2.5 mg nebs every 2 as needed for wheezing and shortness of breath, dextromethorphan-guaifenesin 1 tab p.o. twice daily, -She will need an ambulatory home O2 screen prior to discharge and repeat chest x-ray in the a.m.  Hypotension -BP was on the softer side -Given a 500 mL bolus -Continue to Monitor and Trend BP's per Protocol -Last BP was 99/60   Nausea/ vomiting with poor oral intake -She has had vomiting for few weeks and continued to do so in the hospital -Abdomen distended on exam but has been non tender  -CT scan on 1/16 which revealed a surgically absent gallbladder, CBD of 8 mm, small hiatal hernia with  circumferential esophageal wall thickening suggestive of GERD or less likely esophageal neoplasm -The patient takes Decadron twice a week as outpt -Per family and RN, she vomits/regurgitates just within a few minutes of eating-  -SLP eval done- no signs of aspiration -No thrush noted on exam but noted to have Candidiasis on EGD so now on Fluconazole  - continue pantop as above - GI to see today, possible EGD   Loose stool -Possibly from antibiotics- abdomen is still non tender so less likely it is infectious. This morning loose stool but not watery -C/w Imodium PRN   Multiple myeloma, relapsed Fatigue related to cancer -Receiving pomalidomide as outpatient with 1/16 to have been the date of her next cycle -Has lesions throughout her skeleton -Receives acyclovir prophylaxis twice a day, Decadron and Ritalin -Her oncologist has recommended to hold Pomalidomide & Decadron and follow-up with him as outpatient to decide on whether or not to resume these   Paroxysmal atrial fibrillation with RVR -new diagnosis Right pleural effusion Mod pulm HTN- possibility of diastolic dysfunction -[id number] score is 4 -2 D ECHO> EF 55-60%, unable to evaluate diastolic function, b/l atrial dilation, mild to mod TR, mo elevated pulm pressure, normal IV function with mild RV enlargement -Placed on IV amiodarone and transitioned to oral amiodarone   -Plan for 400 mg twice daily x 10 days (stop date 1/25),  then 200 mg daily and then outpatient follow-up -Anticoagulation was held due to her epistaxis but likely resumed today  Acute Urinary Retention -RN says foley was placed for retention and Foley catheter was initially discontinued however she continues to retain so Foley catheter will be reinserted   Right Lower Extremity Swelling -Per daughter this is new. U/s 1/22 shows no signs DVT   Left Upper Extremity Swelling -U/S ordered was completed today and showed no evidence of the deep vein thrombosis in  the upper 70s but findings are consistent with an acute superficial vein thrombosis involving the left cephalic vein -Continue with elevation   Debility -PT advising snf, family interested in that. TOC consulted for assistance with placement  Hypothyroidism -TSH was 5.614 -Likely sick euthyroid -Continue Levothyroxine 137 mcg po Daily    Esophageal Candidiasis -Noted on EGD -GI recommending Fluconazole 100 mg po Daily x3 Weeks  Metabolic Acidosis -Patient's CO2 is now 21, AG is 8, and Chloride Level is now 109 -Continue to Monitor and Trend and repeat CMP in the AM   Pancytopenia in the Setting of Multiple Myeloma -CBC Trend: Recent Labs  Lab 04/06/2022 0556 03/26/22 0205 03/27/22 0117 03/28/22 1257 03/29/22 1140 03/09/2022 0352 03/23/2022 1411 03/31/22 0726  WBC 4.8 4.1 5.2 4.7 4.4 3.5*  --  3.6*  HGB 8.9* 8.3* 7.7* 8.1* 8.0* 7.7*   < > 8.3*  HCT 26.4* 25.0* 23.5* 25.1* 24.9* 23.9*   < > 25.8*  MCV 96.4 96.9 98.3 100.0 100.4* 103.5*  --  101.2*  PLT 101* 104* 100* 102* 92* 82*  --  106*   < > = values in this interval not displayed.  -Continue to Monitor for S/Sx of Bleeding; No overt bleeding noted now -Repeat CBC in the AM   DVT prophylaxis: SCDs Start: 03/21/2022 1805 Place TED hose Start: 03/16/2022 1805    Code Status: DNR Family Communication: No family present at bedside   Disposition Plan:  Level of care: Progressive Status is: Inpatient Remains inpatient appropriate because: Needs further clinical improvement and PT/OT recommending SNF  Consultants:  Gastroenterology Infectious Diseases Palliative Care Medicine Cardiology  PCCM/Pulmonary   Procedures:  EGD Findings:      Post nasal drainage and blood was seen in the posterior oropharynx.      Patchy, yellow plaques were found in the entire esophagus. Biopsies were       taken with a cold forceps for histology.      The entire examined stomach was normal.      The cardia and gastric fundus were normal  on retroflexion.      A large polypoid mass with no bleeding was found in the second portion       of the duodenum. Biopsies were taken with a cold forceps for histology. Impression:               - Post nasal drainage is seen in the oropharynx.                           - Esophageal plaques were found, suspicious for                            candidiasis. Biopsied.                           - Normal stomach.                           -  Rule out malignancy, duodenal mass. Biopsied.                           No obviouse source in the upper GI tract for                            bleed/hematemeis, likely source is bleeding from                            posterior oropharynx/epistaxis Recommendation:           - Resume previous diet.                           - Continue present medications.                           - Await pathology results.                           - Diflucan (fluconazole) 100 mg PO daily for 3                            weeks.                           - Please consult ENT for epistaxis                           - Ok to restart heparin from GI standpoint  Antimicrobials:  Anti-infectives (From admission, onward)    Start     Dose/Rate Route Frequency Ordered Stop   04/02/22 1000  levofloxacin (LEVAQUIN) tablet 250 mg       See Hyperspace for full Linked Orders Report.   250 mg Oral Daily 03/26/22 1624     03/27/22 2200  levofloxacin (LEVAQUIN) IVPB 750 mg  Status:  Discontinued        750 mg 100 mL/hr over 90 Minutes Intravenous Every 48 hours 03/26/22 0919 03/26/22 1624   03/27/22 2200  levofloxacin (LEVAQUIN) IVPB 750 mg       See Hyperspace for full Linked Orders Report.   750 mg 100 mL/hr over 90 Minutes Intravenous Every 48 hours 03/26/22 1624 04/02/22 2159   03/26/22 1215  sulfamethoxazole-trimethoprim (BACTRIM DS) 800-160 MG per tablet 1 tablet  Status:  Discontinued        1 tablet Oral Once per day on Mon Wed Fri 03/26/22 1125 03/24/2022 0749   04/06/2022  2200  levofloxacin (LEVAQUIN) tablet 750 mg  Status:  Discontinued        750 mg Oral Every 48 hours 04/04/2022 1312 03/26/22 0919   03/20/2022 1215  doxycycline (VIBRA-TABS) tablet 100 mg  Status:  Discontinued        100 mg Oral Every 12 hours 04/06/2022 1128 03/27/2022 1312   03/23/22 1300  vancomycin (VANCOREADY) IVPB 750 mg/150 mL  Status:  Discontinued        750 mg 150 mL/hr over 60 Minutes Intravenous Every 24 hours 04/06/2022 1212 03/23/22 0902   03/23/22 1300  vancomycin (VANCOREADY) IVPB 750 mg/150 mL        750 mg 150 mL/hr over 60 Minutes  Intravenous Every 24 hours 03/23/22 0902 03/23/22 1442   03/21/2022 2200  piperacillin-tazobactam (ZOSYN) IVPB 3.375 g  Status:  Discontinued        3.375 g 12.5 mL/hr over 240 Minutes Intravenous Every 8 hours 03/27/2022 1447 03/10/2022 1652   04/06/2022 2200  ceFEPIme (MAXIPIME) 2 g in sodium chloride 0.9 % 100 mL IVPB  Status:  Discontinued        2 g 200 mL/hr over 30 Minutes Intravenous Every 12 hours 03/09/2022 1652 03/13/2022 1312   04/07/2022 2200  metroNIDAZOLE (FLAGYL) IVPB 500 mg  Status:  Discontinued        500 mg 100 mL/hr over 60 Minutes Intravenous Every 12 hours 03/31/2022 1652 03/18/2022 1128   03/24/2022 2200  acyclovir (ZOVIRAX) tablet 400 mg        400 mg Oral 2 times daily 03/08/2022 1807     03/15/2022 1430  piperacillin-tazobactam (ZOSYN) IVPB 3.375 g        3.375 g 100 mL/hr over 30 Minutes Intravenous  Once 03/28/2022 1430 04/04/2022 1953   03/24/2022 1145  vancomycin (VANCOREADY) IVPB 1250 mg/250 mL        1,250 mg 166.7 mL/hr over 90 Minutes Intravenous  Once 03/26/2022 1130 04/02/2022 1559   03/24/2022 1130  ceFEPIme (MAXIPIME) 1 g in sodium chloride 0.9 % 100 mL IVPB  Status:  Discontinued        1 g 200 mL/hr over 30 Minutes Intravenous  Once 03/12/2022 1118 04/01/2022 1129   03/09/2022 1130  ceFEPIme (MAXIPIME) 2 g in sodium chloride 0.9 % 100 mL IVPB        2 g 200 mL/hr over 30 Minutes Intravenous  Once 03/08/2022 1129 03/29/2022 1330   04/06/2022 1115   cefTRIAXone (ROCEPHIN) 1 g in sodium chloride 0.9 % 100 mL IVPB  Status:  Discontinued        1 g 200 mL/hr over 30 Minutes Intravenous  Once 04/06/2022 1114 04/06/2022 1117   03/09/2022 1115  azithromycin (ZITHROMAX) 500 mg in sodium chloride 0.9 % 250 mL IVPB  Status:  Discontinued        500 mg 250 mL/hr over 60 Minutes Intravenous  Once 03/11/2022 1114 04/06/2022 1117       Subjective: Seen and examined at bedside and states that she was a little sleepy.  Wanting to rest.  She tells me that she did not sleep very well last night but nursing states that she slept quite adequately.  She denies any pain at this time.  No other concerns or complaints at this time but remains a little confused.  Objective: Vitals:   03/31/2022 2330 03/31/22 0348 03/31/22 0718 03/31/22 0745  BP: (!) 102/51 (!) 107/57 (!) 107/47   Pulse: 73 75 77 73  Resp: 20 (!) 22 (!) 28 (!) 21  Temp: (!) 96.4 F (35.8 C) (!) 97.3 F (36.3 C) 97.6 F (36.4 C)   TempSrc:  Oral Oral   SpO2:  98% 98% 96%  Weight:      Height:        Intake/Output Summary (Last 24 hours) at 03/31/2022 0837 Last data filed at 03/31/2022 0600 Gross per 24 hour  Intake 355.69 ml  Output 600 ml  Net -244.31 ml   Filed Weights   03/23/22 1225 03/13/2022 1356  Weight: 57.3 kg 57.3 kg   Examination: Physical Exam:  Constitutional: Elderly chronically appearing Caucasian female currently in no acute distress Respiratory: Diminished to auscultation bilaterally with coarse breath sounds and has  some slight rhonchi and crackles.  No appreciable wheezing or rales. Normal respiratory effort and patient is not tachypenic. No accessory muscle use.  Wearing supplemental oxygen via nasal cannula Cardiovascular: RRR, no murmurs / rubs / gallops. S1 and S2 auscultated.  Has 1+ lower extremity edema and she also has some upper extremity swelling. Abdomen: Soft, non-tender, non-distended. Bowel sounds positive.  GU: Deferred. Musculoskeletal: No clubbing /  cyanosis of digits/nails. No joint deformity upper and lower extremities.  Skin: No rashes, lesions, ulcers on limited skin evaluation. No induration; Warm and dry.  Neurologic: She is little somnolent and drowsy but easily arousable Psychiatric: Impaired judgment and insight.  She is awake little somnolent and not really oriented  Data Reviewed: I have personally reviewed following labs and imaging studies  CBC: Recent Labs  Lab 03/27/22 0117 03/28/22 1257 03/29/22 1140 04/04/2022 0352 03/10/2022 1411 03/31/22 0726  WBC 5.2 4.7 4.4 3.5*  --  3.6*  HGB 7.7* 8.1* 8.0* 7.7* 7.7* 8.3*  HCT 23.5* 25.1* 24.9* 23.9* 25.6* 25.8*  MCV 98.3 100.0 100.4* 103.5*  --  101.2*  PLT 100* 102* 92* 82*  --  073*   Basic Metabolic Panel: Recent Labs  Lab 03/27/22 0117 03/27/22 0144 03/28/22 1257 03/29/22 1140 03/13/2022 0426 03/31/22 0726  NA 136  --  136 137 137 138  K 3.4*  --  4.5 4.4 4.3 4.3  CL 109  --  110 110 110 109  CO2 20*  --  19* 20* 20* 21*  GLUCOSE 101*  --  132* 137* 97 125*  BUN 18  --  '13 13 13 13  '$ CREATININE 1.03*  --  0.86 0.85 0.92 0.97  CALCIUM 6.7*  --  6.9* 6.9* 6.9* 6.7*  MG  --  1.8  --   --   --   --    GFR: Estimated Creatinine Clearance: 36.6 mL/min (by C-G formula based on SCr of 0.97 mg/dL). Liver Function Tests: Recent Labs  Lab 03/26/22 0205  AST 12*  ALT 11  ALKPHOS 50  BILITOT 1.1  PROT 4.8*  ALBUMIN 2.0*   No results for input(s): "LIPASE", "AMYLASE" in the last 168 hours. No results for input(s): "AMMONIA" in the last 168 hours. Coagulation Profile: No results for input(s): "INR", "PROTIME" in the last 168 hours. Cardiac Enzymes: No results for input(s): "CKTOTAL", "CKMB", "CKMBINDEX", "TROPONINI" in the last 168 hours. BNP (last 3 results) No results for input(s): "PROBNP" in the last 8760 hours. HbA1C: No results for input(s): "HGBA1C" in the last 72 hours. CBG: No results for input(s): "GLUCAP" in the last 168 hours. Lipid Profile: No  results for input(s): "CHOL", "HDL", "LDLCALC", "TRIG", "CHOLHDL", "LDLDIRECT" in the last 72 hours. Thyroid Function Tests: No results for input(s): "TSH", "T4TOTAL", "FREET4", "T3FREE", "THYROIDAB" in the last 72 hours. Anemia Panel: No results for input(s): "VITAMINB12", "FOLATE", "FERRITIN", "TIBC", "IRON", "RETICCTPCT" in the last 72 hours. Sepsis Labs: No results for input(s): "PROCALCITON", "LATICACIDVEN" in the last 168 hours.  Recent Results (from the past 240 hour(s))  Culture, blood (routine x 2)     Status: None   Collection Time: 03/24/2022 11:19 AM   Specimen: BLOOD RIGHT HAND  Result Value Ref Range Status   Specimen Description BLOOD RIGHT HAND  Final   Special Requests   Final    BOTTLES DRAWN AEROBIC ONLY Blood Culture results may not be optimal due to an inadequate volume of blood received in culture bottles   Culture   Final  NO GROWTH 5 DAYS Performed at [facility], [address] [place], [place] [address]    Report Status 03/27/2022 FINAL  Final  Culture, blood (routine x 2)     Status: None   Collection Time: 03/10/2022 12:22 PM   Specimen: BLOOD  Result Value Ref Range Status   Specimen Description BLOOD  Final   Special Requests NONE BLOOD RIGHT ARM  Final   Culture   Final    NO GROWTH 5 DAYS Performed at [facility], [address] [place], [place] [address]    Report Status 03/27/2022 FINAL  Final  MRSA Next Gen by PCR, Nasal     Status: None   Collection Time: 04/02/2022  2:27 PM   Specimen: Nasal Mucosa; Nasal Swab  Result Value Ref Range Status   MRSA by PCR Next Gen NOT DETECTED NOT DETECTED Final    Comment: (NOTE) The GeneXpert MRSA Assay (FDA approved for NASAL specimens only), is one component of a comprehensive MRSA colonization surveillance program. It is not intended to diagnose MRSA infection nor to guide or monitor treatment for MRSA infections. Test performance is not FDA approved in patients less than 22 years old. Performed at  [facility], [address] [place], [place] [address]   Body fluid culture w Gram Stain     Status: None   Collection Time: 03/09/2022  3:07 PM   Specimen: Thoracentesis; Body Fluid  Result Value Ref Range Status   Specimen Description THORACENTESIS  Final   Special Requests NONE  Final   Gram Stain   Final    WBC PRESENT,BOTH PMN AND MONONUCLEAR NO ORGANISMS SEEN CYTOSPIN SMEAR    Culture   Final    NO GROWTH 3 DAYS Performed at [facility], [address], [place], [place] [address]    Report Status 03/29/2022 FINAL  Final     Radiology Studies: VAS [place] LOWER EXTREMITY VENOUS (DVT)  Result Date: 03/29/2022  Lower Venous DVT Study Patient Name:  HIEDI TOUCHTON  Date of Exam:   03/29/2022 Medical Rec #: [id number]         Accession #:    [id number] Date of Birth: 11/30/1940         Patient Gender: F Patient Age:   49 years Exam Location:  [facility] Procedure:      VAS [place] LOWER EXTREMITY VENOUS (DVT) Referring Phys: Laurey Arrow --------------------------------------------------------------------------------  Indications: Swelling, and Edema.  Comparison Study: no prior Performing Technologist: Archie Patten RVS  Examination Guidelines: A complete evaluation includes B-mode imaging, spectral Doppler, color Doppler, and power Doppler as needed of all accessible portions of each vessel. Bilateral testing is considered an integral part of a complete examination. Limited examinations for reoccurring indications may be performed as noted. The reflux portion of the exam is performed with the patient in reverse Trendelenburg.  +---------+---------------+---------+-----------+----------+--------------+ RIGHT    CompressibilityPhasicitySpontaneityPropertiesThrombus Aging +---------+---------------+---------+-----------+----------+--------------+ CFV      Full           Yes      Yes                                  +---------+---------------+---------+-----------+----------+--------------+ SFJ      Full                                                        +---------+---------------+---------+-----------+----------+--------------+  FV Prox  Full                                                        +---------+---------------+---------+-----------+----------+--------------+ FV Mid   Full                                                        +---------+---------------+---------+-----------+----------+--------------+ FV DistalFull                                                        +---------+---------------+---------+-----------+----------+--------------+ PFV      Full                                                        +---------+---------------+---------+-----------+----------+--------------+ POP      Full           Yes      Yes                                 +---------+---------------+---------+-----------+----------+--------------+ PTV      Full                                                        +---------+---------------+---------+-----------+----------+--------------+ PERO     Full           Yes      Yes                                 +---------+---------------+---------+-----------+----------+--------------+     Summary: RIGHT: - There is no evidence of deep vein thrombosis in the lower extremity.  - No cystic structure found in the popliteal fossa.  LEFT: - No evidence of common femoral vein obstruction.  *See table(s) above for measurements and observations. Electronically signed by Servando Snare MD on 03/29/2022 at 7:48:34 PM.    Final     Scheduled Meds:  acyclovir  400 mg Oral BID   amiodarone  400 mg Oral BID   Chlorhexidine Gluconate Cloth  6 each Topical Daily   dextromethorphan-guaiFENesin  1 tablet Oral BID   [START ON 04/02/2022] levofloxacin  250 mg Oral Daily   levothyroxine  137 mcg Oral QAC breakfast   oxymetazoline  1 spray Each  Nare BID   [START ON 04/02/2022] pantoprazole  40 mg Intravenous Q12H   sodium chloride flush  3 mL Intravenous Q12H   sodium chloride flush  3 mL Intravenous Q12H   Continuous Infusions:  sodium chloride 10 mL/hr at 03/31/22 0513   levofloxacin (LEVAQUIN) IV 750 mg (03/29/22  2207)   pantoprazole 8 mg/hr (03/14/2022 1535)    LOS: 9 days   Raiford Noble, DO [facility] Available via Epic secure chat 7am-7pm After these hours, please refer to coverage provider listed on [email or web address] 03/31/2022, 8:37 AM

## 2022-03-31 NOTE — Plan of Care (Signed)
  Problem: Health Behavior/Discharge Planning: Goal: Ability to manage health-related needs will improve Outcome: Progressing   Problem: Clinical Measurements: Goal: Ability to maintain clinical measurements within normal limits will improve Outcome: Progressing Goal: Will remain free from infection Outcome: Progressing Goal: Respiratory complications will improve Outcome: Progressing Goal: Cardiovascular complication will be avoided Outcome: Progressing   Problem: Activity: Goal: Risk for activity intolerance will decrease Outcome: Progressing

## 2022-04-01 ENCOUNTER — Other Ambulatory Visit (HOSPITAL_COMMUNITY): Payer: Self-pay

## 2022-04-01 ENCOUNTER — Other Ambulatory Visit: Payer: Self-pay

## 2022-04-01 ENCOUNTER — Inpatient Hospital Stay (HOSPITAL_COMMUNITY): Payer: Medicare Other

## 2022-04-01 DIAGNOSIS — J189 Pneumonia, unspecified organism: Secondary | ICD-10-CM | POA: Diagnosis not present

## 2022-04-01 DIAGNOSIS — D696 Thrombocytopenia, unspecified: Secondary | ICD-10-CM | POA: Diagnosis not present

## 2022-04-01 DIAGNOSIS — J851 Abscess of lung with pneumonia: Secondary | ICD-10-CM | POA: Diagnosis not present

## 2022-04-01 DIAGNOSIS — C9000 Multiple myeloma not having achieved remission: Secondary | ICD-10-CM | POA: Diagnosis not present

## 2022-04-01 LAB — COMPREHENSIVE METABOLIC PANEL
ALT: 12 U/L (ref 0–44)
AST: 11 U/L — ABNORMAL LOW (ref 15–41)
Albumin: 2.5 g/dL — ABNORMAL LOW (ref 3.5–5.0)
Alkaline Phosphatase: 43 U/L (ref 38–126)
Anion gap: 10 (ref 5–15)
BUN: 14 mg/dL (ref 8–23)
CO2: 20 mmol/L — ABNORMAL LOW (ref 22–32)
Calcium: 6.5 mg/dL — ABNORMAL LOW (ref 8.9–10.3)
Chloride: 108 mmol/L (ref 98–111)
Creatinine, Ser: 0.86 mg/dL (ref 0.44–1.00)
GFR, Estimated: >60 mL/min (ref >60)
Glucose, Bld: 129 mg/dL — ABNORMAL HIGH (ref 70–99)
Potassium: 4.1 mmol/L (ref 3.5–5.1)
Sodium: 138 mmol/L (ref 135–145)
Total Bilirubin: 0.5 mg/dL (ref 0.3–1.2)
Total Protein: 5.2 g/dL — ABNORMAL LOW (ref 6.5–8.1)

## 2022-04-01 LAB — CBC WITH DIFFERENTIAL/PLATELET
Abs Immature Granulocytes: 0.06 10*3/uL (ref 0.00–0.07)
Basophils Absolute: 0 10*3/uL (ref 0.0–0.1)
Basophils Relative: 0 %
Eosinophils Absolute: 0 10*3/uL (ref 0.0–0.5)
Eosinophils Relative: 0 %
HCT: 24.3 % — ABNORMAL LOW (ref 36.0–46.0)
Hemoglobin: 7.6 g/dL — ABNORMAL LOW (ref 12.0–15.0)
Immature Granulocytes: 1 %
Lymphocytes Relative: 11 %
Lymphs Abs: 0.5 10*3/uL — ABNORMAL LOW (ref 0.7–4.0)
MCH: 32.5 pg (ref 26.0–34.0)
MCHC: 31.3 g/dL (ref 30.0–36.0)
MCV: 103.8 fL — ABNORMAL HIGH (ref 80.0–100.0)
Monocytes Absolute: 0.2 10*3/uL (ref 0.1–1.0)
Monocytes Relative: 6 %
Neutro Abs: 3.5 10*3/uL (ref 1.7–7.7)
Neutrophils Relative %: 82 %
Platelets: 102 10*3/uL — ABNORMAL LOW (ref 150–400)
RBC: 2.34 MIL/uL — ABNORMAL LOW (ref 3.87–5.11)
RDW: 22.8 % — ABNORMAL HIGH (ref 11.5–15.5)
WBC: 4.3 10*3/uL (ref 4.0–10.5)
nRBC: 1.4 % — ABNORMAL HIGH (ref 0.0–0.2)

## 2022-04-01 LAB — MAGNESIUM: Magnesium: 1.8 mg/dL (ref 1.7–2.4)

## 2022-04-01 LAB — PHOSPHORUS: Phosphorus: 1.5 mg/dL — ABNORMAL LOW (ref 2.5–4.6)

## 2022-04-01 LAB — HEPARIN LEVEL (UNFRACTIONATED): Heparin Unfractionated: 0.56 IU/mL (ref 0.30–0.70)

## 2022-04-01 MED ORDER — K PHOS MONO-SOD PHOS DI & MONO 155-852-130 MG PO TABS
500.0000 mg | ORAL_TABLET | Freq: Two times a day (BID) | ORAL | Status: AC
  Administered 2022-04-01 (×2): 500 mg via ORAL
  Filled 2022-04-01 (×2): qty 2

## 2022-04-01 MED ORDER — ACETAMINOPHEN 500 MG PO TABS
1000.0000 mg | ORAL_TABLET | Freq: Three times a day (TID) | ORAL | Status: DC
  Administered 2022-04-01: 1000 mg via ORAL
  Filled 2022-04-01 (×2): qty 2

## 2022-04-01 MED ORDER — MAGNESIUM SULFATE 2 GM/50ML IV SOLN
2.0000 g | Freq: Once | INTRAVENOUS | Status: AC
  Administered 2022-04-01: 2 g via INTRAVENOUS
  Filled 2022-04-01: qty 50

## 2022-04-01 MED ORDER — POTASSIUM PHOSPHATES 15 MMOLE/5ML IV SOLN: 30.0000 mmol | Freq: Once | INTRAVENOUS | Status: DC

## 2022-04-01 MED ORDER — FUROSEMIDE 10 MG/ML IJ SOLN
20.0000 mg | Freq: Once | INTRAMUSCULAR | Status: AC
  Administered 2022-04-01: 20 mg via INTRAVENOUS
  Filled 2022-04-01: qty 2

## 2022-04-01 MED ORDER — ALBUMIN HUMAN 25 % IV SOLN
25.0000 g | Freq: Once | INTRAVENOUS | Status: AC
  Administered 2022-04-01: 25 g via INTRAVENOUS

## 2022-04-01 NOTE — TOC Benefit Eligibility Note (Signed)
Patient Advocate Encounter  Insurance verification completed.    The patient is currently admitted and upon discharge could be taking Eliquis 5 mg.  The current 30 day co-pay is $0.00.   The patient is currently admitted and upon discharge could be taking Xarelto 20 mg.  The current 30 day co-pay is $0.00.   The patient is insured through [employer] Medicare Part D   Tyannah Sane, CPHT Pharmacy Patient Advocate Specialist [facility] Patient Advocate Team Direct Number: [phone]  Fax: [phone]       

## 2022-04-01 NOTE — Progress Notes (Addendum)
Daily Progress Note   Patient Name: Courtney Arnold       Date: 04/01/2022 DOB: Jul 10, 1940  Age: 82 y.o. MRN#: [id number] Attending Physician: Kerney Elbe, DO Primary Care Physician: Derek Jack, MD Admit Date: 04/07/2022  Reason for Consultation/Follow-up: Establishing goals of care  Patient Profile/HPI:  82 y.o. female  with past medical history of multiple myeloma not in remission, active with Dr. Delton Coombes- was on decadron and polymyst which is now on hold due to her infections, significant pancytopenia with anemia and thrombocytopenia, COPD, A-fib postop with spontaneous conversion, HTN, thyroid disease, compression fracture T12 with kyphoplasty 2011, right femur IM nailing 2021, admitted on 03/29/2022 with lung abscess multi focal pneumonia with abscess formation. Recovery has been complicated by a-fib, thrush, epistaxis and hematemesis. EGD completed on 1/23 with findings of thrush, and duodenal mass- awaiting biopsy. Seen by ID- to complete antibiotic course on 1/25.    Subjective: Chart reviewed including labs, progress notes, imaging from this and previous encounters.  Patient was sitting up at the bedside in the chair.  Her daughter was present.  They had no questions for follow-up of our conversation yesterday.  Patient reports pain in left arm-noted she has a DVT there.   Physical Exam Vitals and nursing note reviewed.  Constitutional:      Appearance: She is ill-appearing.     Comments: lethargic  HENT:     Nose:     Comments: Dried blood  Pulmonary:     Effort: Pulmonary effort is normal.  Skin:    Coloration: Skin is pale.             Vital Signs: BP 107/63 (BP Location: Right Arm)   Pulse 73   Temp 97.6 F (36.4 C) (Oral)   Resp (!) 22   Ht 5'  2.01" (1.575 m)   Wt 57.3 kg   SpO2 97%   BMI 23.10 kg/m  SpO2: SpO2: 97 % O2 Device: O2 Device: Room Air O2 Flow Rate: O2 Flow Rate (L/min): 2 L/min  Intake/output summary:  Intake/Output Summary (Last 24 hours) at 04/01/2022 1610 Last data filed at 04/01/2022 1221 Gross per 24 hour  Intake 942.78 ml  Output --  Net 942.78 ml    LBM: Last BM Date : 04/01/22 Baseline Weight: Weight: 57.3 kg Most recent weight:  Weight: 57.3 kg       Palliative Assessment/Data: PPS: 30%      Patient Active Problem List   Diagnosis Date Noted   Postprocedural pneumothorax 03/27/2022   Pleural effusion 04/01/2022   Lung abscess ([place]) 04/02/2022   Multifocal pneumonia with Lung Abscess 03/28/2022   Paroxysmal atrial fibrillation with RVR ([place]) 03/24/2022   DNR (do not resuscitate)/DNI(Do Not Intubate) 01/13/2022   Epistaxis 09/27/2021   Acute blood loss anemia 09/27/2021   Thrombocytopenia ([place]) 09/24/2021   Symptomatic anemia 09/22/2021   AKI (acute kidney injury) ([place]) 09/22/2021   Pulmonary infiltrates 09/22/2021   Pancytopenia, acquired -Multiple Myeloma Related 09/17/2020   Multiple myeloma not having achieved remission ([place]) 06/07/2019   Goals of care, counseling/discussion 06/07/2019   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   Vitamin B 12 deficiency 05/22/2019   Chronic anxiety 05/22/2019   Closed fracture of right femur ([place]) 05/10/2019   Hypothyroidism 05/10/2019   Unspecified atrial fibrillation ([place]) 05/10/2019   Anxiety 05/10/2019   Anemia, unspecified 05/10/2019   Sacral lytic lesions 05/10/2019   Bone lesion 05/08/2019   Osteoarthritis of knee 04/06/2019   Common bile duct dilation 09/01/2017   Lower back pain 09/01/2017   Vitamin D deficiency 02/03/2016   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   History of colonic polyps 11/06/2015   Thyroid-related proptosis 06/25/2015   History of biliary stent insertion 04/21/2015   AP (abdominal pain)     Compression fracture of L2 lumbar vertebra ([facility])    Abdominal pain 04/12/2015   TOBACCO ABUSE 08/21/2009   ATRIAL FIBRILLATION, HX OF 08/21/2009   Tobacco abuse 08/21/2009    Palliative Care Assessment & Plan    Assessment/Recommendations/Plan  Continue current plan of care-family plan for discharge to rehab Start scheduled acetaminophen p.o. 1000 mg 3 times daily for pain Recommend liberalizing diet and asking for her preferences once she is cleared of liquid diet Recommend oxygen be humidified to help with nosebleeding-   Code Status: DNR  Prognosis:  Unable to determine  Discharge Planning: To Be Determined  Care plan was discussed with patient's daughters at the bedside.   Thank you for allowing the Palliative Medicine Team to assist in the care of this patient.  Greater than 50%  of this time was spent counseling and coordinating care related to the above assessment and plan.  Mariana Kaufman, AGNP-C Palliative Medicine   Please contact Palliative Medicine Team phone at [phone] for questions and concerns.

## 2022-04-01 NOTE — Progress Notes (Signed)
[place] for Heparin Indication: atrial fibrillation Brief A/P: Heparin level within goal range Continue Heparin at current rate   No Known Allergies  Patient Measurements: Height: 5' 2.01" (157.5 cm) Weight: 57.3 kg (126 lb 5.2 oz) IBW/kg (Calculated) : 50.12 Heparin Dosing Weight: 57 kg  Vital Signs: Temp: 97.5 F (36.4 C) (01/25 0000) Temp Source: Oral (01/25 0000) BP: 111/64 (01/25 0000) Pulse Rate: 78 (01/25 0000)  Labs: Recent Labs    03/29/22 1140 04/04/2022 0352 03/24/2022 0426 03/08/2022 1411 03/31/22 0726 04/01/22 0135  HGB 8.0* 7.7*  --  7.7* 8.3* 7.6*  HCT 24.9* 23.9*  --  25.6* 25.8* 24.3*  PLT 92* 82*  --   --  106* 102*  APTT  --   --  39*  --   --   --   HEPARINUNFRC 0.47  --  0.36  --   --  0.56  CREATININE 0.85  --  0.92  --  0.97 0.86     Estimated Creatinine Clearance: 41.3 mL/min (by C-G formula based on SCr of 0.86 mg/dL).  Assessment: 82 y.o. female with Afib for heparin  Goal of Therapy:  Heparin level 0.3-0.7 units/ml  Monitor platelets by anticoagulation protocol: Yes   Plan:  Continue Heparin at current rate   Phillis Knack, PharmD, BCPS  04/01/2022,2:35 AM

## 2022-04-01 NOTE — Progress Notes (Signed)
ANTICOAGULATION CONSULT NOTE\  Pharmacy Consult for Heparin Indication: atrial fibrillation  No Known Allergies  Patient Measurements: Height: 5' 2.01" (157.5 cm) Weight: 57.3 kg (126 lb 5.2 oz) IBW/kg (Calculated) : 50.12 Heparin Dosing Weight: 57 kg  Vital Signs: Temp: 98.1 F (36.7 C) (01/25 0828) Temp Source: Oral (01/25 0828) BP: 112/90 (01/25 0828) Pulse Rate: 73 (01/25 0828)  Labs: Recent Labs    03/29/22 1140 03/23/2022 0352 03/23/2022 0426 03/09/2022 1411 03/31/22 0726 04/01/22 0135  HGB 8.0* 7.7*  --  7.7* 8.3* 7.6*  HCT 24.9* 23.9*  --  25.6* 25.8* 24.3*  PLT 92* 82*  --   --  106* 102*  APTT  --   --  39*  --   --   --   HEPARINUNFRC 0.47  --  0.36  --   --  0.56  CREATININE 0.85  --  0.92  --  0.97 0.86     Estimated Creatinine Clearance: 41.3 mL/min (by C-G formula based on SCr of 0.86 mg/dL).   Medical History: Past Medical History:  Diagnosis Date   Atrial fibrillation ([place]) 2011   Postop, spontaneous conversion to normal sinus after one hour   CAP (community acquired pneumonia) 09/23/2021   Cholecystitis, acute 04/25/2015   Compression fracture 07/24/2009   T12; kyphoplasty   History of echocardiogram 07/2009   EF 65%   Hypertension    Thyroid disease    Tobacco abuse     Medications:  Medications Prior to Admission  Medication Sig Dispense Refill Last Dose   acyclovir (ZOVIRAX) 400 MG tablet TAKE (1) TABLET BY MOUTH TWICE DAILY. (Patient taking differently: Take 400 mg by mouth 2 (two) times daily.) 60 tablet 6 03/21/2022   CALCIUM PO Take 1 capsule by mouth daily.   03/21/2022   carboxymethylcellulose (REFRESH PLUS) 0.5 % SOLN Place 1 drop into both eyes at bedtime.   03/21/2022   cephALEXin (KEFLEX) 500 MG capsule Take 1 capsule (500 mg total) by mouth 2 (two) times daily. 10 capsule 0 03/21/2022   Cholecalciferol (VITAMIN D) 125 MCG (5000 UT) CAPS Take 5,000 Units by mouth daily. 90 capsule 1 03/21/2022   Cyanocobalamin (B-12 PO) Take 1  capsule by mouth daily.   03/21/2022   dexamethasone (DECADRON) 4 MG tablet Take 2.5 tablets (10 mg total) by mouth 2 (two) times a week. 20 tablet 4 Past Week   levothyroxine (SYNTHROID) 137 MCG tablet Take 1 tablet (137 mcg total) by mouth daily before breakfast. 90 tablet 0 03/21/2022   methylphenidate (RITALIN) 5 MG tablet Take 10 mg by mouth daily. Only on days she is not taking dexamethasone tue,thu,fri,sat,sun   03/21/2022   montelukast (SINGULAIR) 10 MG tablet Take one tablet by mouth 1 hour prior to Darzalex injection appointments (Patient taking differently: Take one tablet by mouth 1 hour prior to Darzalex injection appointments - given every other Wednesday) 30 tablet 2 unk   Multiple Vitamin (MULTIVITAMIN WITH MINERALS) TABS tablet Take 1 tablet by mouth daily.   03/21/2022   pomalidomide (POMALYST) 3 MG capsule Take 1 capsule (3 mg total) by mouth daily. Take for 21 days, then hold for 7 days. Repeat every 28 days. 21 capsule 0 Past Month   potassium chloride (KLOR-CON M) 10 MEQ tablet Take 2 tablets (20 mEq total) by mouth daily. 60 tablet 3 03/21/2022   traMADol (ULTRAM) 50 MG tablet Take 1 tablet (50 mg total) by mouth every 12 (twelve) hours as needed. 30 tablet 0 03/19/2022  Assessment: 82 y.o. F on heparin->Lovenox for afib 1/17-1/23 at which time pt had hematemesis so reversed with protamine. Pt also with nosebleed 1/23 and GI felt that hematemesis was from nosebleed. No reoccurrence of bleeding since. Hgb 7.6 today (low but stable) and plt 103 (stable). Pharmacy consulted to dose heparin.  Goal of Therapy:  Heparin level 0.3-0.5 units/ml (aim for lower goal with recent bleeding) Monitor platelets by anticoagulation protocol: Yes   Plan:  Continue heparin gtt at 1000 units/hr.  Daily heparin level and CBC F/u closely for any bleeding  Hildred Laser, PharmD Clinical Pharmacist **Pharmacist phone directory can now be found on [email or web address] ([email or web address]).  Listed under [facility].

## 2022-04-01 NOTE — Progress Notes (Signed)
Physical Therapy Treatment Patient Details Name: Courtney Arnold MRN: [id number] DOB: 12/12/1940 Today's Date: 04/01/2022   History of Present Illness Pt is an 82 y.o. female admitted 03/31/2022 with abdominal pain, 3 weeks N&V daughter feels due to new meds for MM.  Found to have R&L pleural effusions. Patient underwent R thoracentesis 04/07/2022, had small pneumo.  Other PMH includes relapsed IgA kappa plasma cell myeloma, hypothyroidism, PAF, HTN.    PT Comments    Patient mobilizing today OOB to chair with +2 A for safety as pt with posterior lean and LE weakness/fatigue.  She sits quickly so assist to get chair close.  VSS, but remains with poor activity tolerance.  She will benefit from skilled PT to progress mobility as able.    Recommendations for follow up therapy are one component of a multi-disciplinary discharge planning process, led by the attending physician.  Recommendations may be updated based on patient status, additional functional criteria and insurance authorization.  Follow Up Recommendations  Skilled nursing-short term rehab (<3 hours/day) Can patient physically be transported by private vehicle: No   Assistance Recommended at Discharge Frequent or constant Supervision/Assistance  Patient can return home with the following Two people to help with walking and/or transfers;A lot of help with bathing/dressing/bathroom;Direct supervision/assist for medications management;Assist for transportation;Help with stairs or ramp for entrance;Assistance with cooking/housework;Direct supervision/assist for financial management   Equipment Recommendations  Other (comment) (TBA)    Recommendations for Other Services       Precautions / Restrictions Precautions Precautions: Fall     Mobility  Bed Mobility Overal bed mobility: Needs Assistance Bed Mobility: Rolling, Sidelying to Sit Rolling: Min assist Sidelying to sit: Mod assist       General bed mobility comments: cues for  technique and rail use and assist for legs off bed and to lift trunk    Transfers Overall transfer level: Needs assistance Equipment used: Rolling walker (2 wheels) Transfers: Sit to/from Stand Sit to Stand: Mod assist, +2 physical assistance           General transfer comment: lifting help to stand to RW, patient leaning back, assist to move walker and support to take "shuffle steps" to recliner    Ambulation/Gait                   Stairs             Wheelchair Mobility    Modified Rankin (Stroke Patients Only)       Balance Overall balance assessment: Needs assistance Sitting-balance support: Bilateral upper extremity supported Sitting balance-Leahy Scale: Poor Sitting balance - Comments: leaning back on EOB for BP check Postural control: Posterior lean     Standing balance comment: +2 min to mod with RW for balance                            Cognition Arousal/Alertness: Awake/alert Behavior During Therapy: Flat affect Overall Cognitive Status: No family/caregiver present to determine baseline cognitive functioning Area of Impairment: Following commands, Problem solving, Attention                   Current Attention Level: Sustained   Following Commands: Follows one step commands with increased time     Problem Solving: Slow processing, Decreased initiation, Difficulty sequencing, Requires verbal cues, Requires tactile cues          Exercises Other Exercises Other Exercises: UE AROM in supine  General Comments General comments (skin integrity, edema, etc.): on 3L O2 BP sitting 104/63      Pertinent Vitals/Pain Pain Assessment Pain Assessment: Faces Faces Pain Scale: Hurts little more Pain Location: generalized Pain Descriptors / Indicators: Grimacing Pain Intervention(s): Monitored during session, Repositioned    Home Living                          Prior Function            PT Goals (current  goals can now be found in the care plan section) Progress towards PT goals: Progressing toward goals    Frequency    Min 3X/week      PT Plan Current plan remains appropriate    Co-evaluation              AM-PAC PT "6 Clicks" Mobility   Outcome Measure  Help needed turning from your back to your side while in a flat bed without using bedrails?: A Lot Help needed moving from lying on your back to sitting on the side of a flat bed without using bedrails?: A Lot Help needed moving to and from a bed to a chair (including a wheelchair)?: Total Help needed standing up from a chair using your arms (e.g., wheelchair or bedside chair)?: Total Help needed to walk in hospital room?: Total Help needed climbing 3-5 steps with a railing? : Total 6 Click Score: 8    End of Session Equipment Utilized During Treatment: Gait belt Activity Tolerance: Patient limited by fatigue Patient left: in chair;with chair alarm set Nurse Communication: Need for lift equipment;Mobility status PT Visit Diagnosis: Muscle weakness (generalized) (M62.81);Other abnormalities of gait and mobility (R26.89);Unsteadiness on feet (R26.81)     Time: 1410-3013 PT Time Calculation (min) (ACUTE ONLY): 22 min  Charges:  $Therapeutic Activity: 8-22 mins                     Courtney Arnold, PT Acute Rehabilitation Services [phone] 04/01/2022    Reginia Naas 04/01/2022, 1:54 PM

## 2022-04-01 NOTE — Anesthesia Postprocedure Evaluation (Signed)
Anesthesia Post Note  Patient: Courtney Arnold  Procedure(s) Performed: ESOPHAGOGASTRODUODENOSCOPY (EGD) WITH PROPOFOL BIOPSY     Patient location during evaluation: Endoscopy Anesthesia Type: General Level of consciousness: patient cooperative Pain management: pain level controlled Vital Signs Assessment: post-procedure vital signs reviewed and stable Respiratory status: spontaneous breathing, nonlabored ventilation and patient connected to nasal cannula oxygen Cardiovascular status: blood pressure returned to baseline and stable Postop Assessment: no apparent nausea or vomiting Anesthetic complications: no   No notable events documented.  Last Vitals:  Vitals:   03/31/22 2000 04/01/22 0000  BP: (!) 96/53 111/64  Pulse: 71 78  Resp: 20 20  Temp: 36.6 C (!) 36.4 C  SpO2: 99% 95%    Last Pain:  Vitals:   04/01/22 0000  TempSrc: Oral  PainSc:                  Dallana Mavity

## 2022-04-01 NOTE — Progress Notes (Signed)
PROGRESS NOTE    Courtney Arnold  LYY:[id number] DOB: [birth year] DOA: 03/15/2022 PCP: Derek Jack, MD   Brief Narrative:  The patient is an 82 year old female with multiple myeloma, paroxysmal atrial fibrillation, COPD, pancytopenia required packed red blood cell transfusion as well as platelet transfusion, hypothyroidism as well as other comorbidities who presented to [facility] on 03/09/2022, vomiting and poor oral intake for last 3 weeks.  She underwent further imaging and CT chest abdomen pelvis revealed multifocal pneumonia and possible abscess in the right middle lobe with moderate right and small left pleural effusions.  She was transferred to [facility] on 03/23/2022 at the recommendation of pulmonary to obtain a pulmonary consult and she developed A-fib with RVR and started on amiodarone infusion.   On 03/24/2022 pulmonary recommended holding off bronchial containing antibiotics and switch IV amiodarone to oral   03/16/2022 she underwent a thoracentesis of the right pleural space with 200 cc of cloudy fluid obtained and she had a small iatrogenic pneumothorax.  ID was consulted she started on levofloxacin and there was a discussion with her oncologist as below   On 03/26/2022 she started having frequent small stools which continued and on 03/28/2022 heparin was resumed and Imodium frequency was increased.  On 03/15/2022 she had an episode of hematemesis and protamine given and anticoagulation was stopped.   On 03/28/2022 she is felt not to have capacity make her own medical decisions and goals of care discussion was held with her daughters and the attending physician at that time.  Attending physician Dr. Wynelle Cleveland felt that she had a very poor prognosis and was still not able to eat and having multiple loose stools along with her multiple myeloma, pancytopenia as well as new onset A-fib with RVR as well as pneumonia and lung abscess and transudative pleural effusion and slight  pneumothorax that she would have a poor outcome.  Family did not want to transition her to comfort care and wanted to treat the treatable.  Family felt that she could not go home.   After her episode of hematemesis she did have a nosebleed and GI scoped the patient and felt that the bleeding was from her nose given her history of epistaxis.  GI has since signed off the case but did find esophageal plaques suspicious for candidiasis and so she was started on fluconazole and the ID team increase this to 200 mg daily.    04/01/2022 she appears little volume overloaded so we will give her dose of IV Lasix just 20 mg and given a dose of IV albumin 25 g as well.  Diet will be advanced and she has no further epistaxis so we will consider changing her from IV heparin to an oral anticoagulant but will wait to see if she requires a repeat thoracentesis.  May need to reengage pulmonary given that her right pleural effusion is slightly worsening.  Palliative care continue to have goals of care discussion.  Assessment and Plan:  Aspiration from vomiting at home but cannot rule out other infections as she is immune compromised -CT reveals bilateral multifocal nodules with consolidation in the right middle lobe, right moderate pleural effusion and small left pleural effusion-near complete resolution of previously noted left upper lobe consolidation  -ID has evaluated the patient and recommended oral levaquin and have now placed a stop date for this. Given ongoing loose stools they advise continuing through 1/25 and then trial off it -Continue with albuterol 2.5 mg nebs every 2 as needed  for wheezing and shortness of breath, dextromethorphan-guaifenesin 1 tab p.o. twice daily, -Will add Flutter valve and incentive spirometry -Continue monitor respiratory status carefully and repeat chest x-ray in a.m. [name] was following and recommended antibiotics for 14 days as well as aspiration precautions and pulmonary  hygiene -Pulmonary recommends continuing goals of care discussion and repeat CXR as below so will re-engage Pulmonary for possible repeat Thoracentesis    Epistaxis causing Hematemesis, improving  -Has hx of nosebleeds and had one yesterday that was minor and resolved spontaneously.  Yesterday morning RN reported vomiting up blood after being given water. Has not had recurrence. Hgb 7.7 today from 8 yesterday. -GI evaluated and was n.p.o. yesterday for EGD and now on a clear liquid diet; EGD was done and there is no obvious source in the upper GI tract for bleeding or hematemesis and likely the source of bleeding from the posterior oropharynx and epistaxis -Now off lovenox, protamine given -Will try to wean off o2 as likely contributes to nosebleed. If unable to do so will plan on humidified air. Will also continue Oxymetazoline 1 spray each nare BID  -Hgb/Hct Trend: Last Labs           Recent Labs  Lab 03/26/22 0205 03/27/22 0117 03/28/22 1257 03/29/22 1140 04/03/2022 0352 03/21/2022 1411 03/31/22 0726  HGB 8.3* 7.7* 8.1* 8.0* 7.7* 7.7* 8.3*  HCT 25.0* 23.5* 25.1* 24.9* 23.9* 25.6* 25.8*  MCV 96.9 98.3 100.0 100.4* 103.5*  --  101.2*    -Was on PPI gtt but will go to q12h -FOBT was Positive  -If she continues to currently have epistaxis we will consult ENT for further evaluation and recommendations   Duodenal Mass -Large polypoid mass with no bleeding was found in the second portion of the duodenum and biopsies were taken with cold forceps for histology and these are currently pending   Iatrogenic right pneumothorax Acute Hypoxic respiratory failure -Post thoracentesis - improving but Re-occurring Pleural Effusion so will discuss with Pulmonary   -SpO2: 97 % O2 Flow Rate (L/min): 2 L/min FiO2 (%): 50 % -Currently on levofloxacin for antibiotic coverage -Continue with albuterol 2.5 mg nebs every 2 as needed for wheezing and shortness of breath, dextromethorphan-guaifenesin 1 tab  p.o. twice daily, -She will need an ambulatory home O2 screen prior to discharge and repeat chest x-ray in the a.m. -Re-engage Pulmonary in the AM for possible repeat thoracentesis    Hypotension -BP was on the softer side -Given a 500 mL bolus but will hold further fluid given volume overload -Continue to Monitor and Trend BP's per Protocol -Last BP was 88/41 and on the softer side -Will order TED hose and IV Albumin 25 g x1   Nausea/ vomiting with poor oral intake -She has had vomiting for few weeks and continued to do so in the hospital -Abdomen distended on exam but has been non tender  -CT scan on 1/16 which revealed a surgically absent gallbladder, CBD of 8 mm, small hiatal hernia with circumferential esophageal wall thickening suggestive of GERD or less likely esophageal neoplasm -The patient takes Decadron twice a week as outpt -Per family and RN, she vomits/regurgitates just within a few minutes of eating-  -SLP eval done- no signs of aspiration -No thrush noted on exam but noted to have Candidiasis on EGD so now on Fluconazole  - continue pantop as above - GI to see today, possible EGD   Loose stool -Possibly from antibiotics- abdomen is still non tender so less likely it  is infectious. This morning loose stool but not watery -C/w Imodium PRN   Multiple myeloma, relapsed Fatigue related to cancer -Receiving pomalidomide as outpatient with 1/16 to have been the date of her next cycle -Has lesions throughout her skeleton -Receives acyclovir prophylaxis twice a day, Decadron and Ritalin -Her oncologist has recommended to hold Pomalidomide & Decadron and follow-up with him as outpatient to decide on whether or not to resume these   Paroxysmal atrial fibrillation with RVR -new diagnosis Pleural effusions; Right > Left  Mod pulm HTN- possibility of diastolic dysfunction -[id number] score is 4 -Transthoracic ECHO> EF 55-60%, unable to evaluate diastolic function, b/l atrial  dilation, mild to mod TR, mo elevated pulm pressure, normal IV function with mild RV enlargement -Placed on IV amiodarone and transitioned to oral amiodarone   -Given a dose of IV Lasix 20 mg x 1 -Plan for 400 mg twice daily x 10 days (stop date 1/25), then 200 mg daily and then outpatient follow-up -Anticoagulation was held due to her epistaxis but resumed yesterday  -Chest x-ray today showed "Increased moderate right and small left pleural effusions. No pneumothorax. Similar airspace opacities in the right mid lung"   Acute Urinary Retention -RN says foley was placed for retention and Foley catheter was initially discontinued however she continues to retain so Foley catheter will be reinserted   Right Lower Extremity Swelling -Per daughter this is new. U/s 1/22 shows no signs DVT   Left Upper Extremity Swelling -U/S ordered was completed today and showed no evidence of the deep vein thrombosis in the upper extremites but findings are consistent with an acute superficial vein thrombosis involving the left cephalic vein -Continue with elevation -Back on Anticoagulation    Debility -PT advising SNF, family interested in that. TOC consulted for assistance with placement   Hypothyroidism -TSH was 5.614 -Likely sick euthyroid -Continue Levothyroxine 137 mcg po Daily    Esophageal Candidiasis -Noted on EGD -GI recommending Fluconazole 100 mg po Daily x3 Weeks   Metabolic Acidosis -Patient's CO2 is now 20, AG is 10, and Chloride Level is now 108 -Continue to Monitor and Trend and repeat CMP in the AM   Hypophosphatemia -Patient's Phos Level is now 1.5 -Replete with po K Phos Neutral 500 mg po BID x 2  -Continue to Monitor and Replete as Necessary -Repeat Phos Level in the AM    Pancytopenia in the Setting of Multiple Myeloma -CBC Trend: Recent Labs  Lab 03/26/22 0205 03/27/22 0117 03/28/22 1257 03/29/22 1140 03/16/2022 0352 03/29/2022 1411 03/31/22 0726 04/01/22 0135  WBC  4.1 5.2 4.7 4.4 3.5*  --  3.6* 4.3  HGB 8.3* 7.7* 8.1* 8.0* 7.7*   < > 8.3* 7.6*  HCT 25.0* 23.5* 25.1* 24.9* 23.9*   < > 25.8* 24.3*  MCV 96.9 98.3 100.0 100.4* 103.5*  --  101.2* 103.8*  PLT 104* 100* 102* 92* 82*  --  106* 102*   < > = values in this interval not displayed.  -Continue to Monitor for S/Sx of Bleeding; No overt bleeding noted now -Repeat CBC in the AM   DVT prophylaxis: Place TED hose Start: 04/01/22 0959 SCDs Start: 03/18/2022 1805 Place TED hose Start: 03/23/2022 1805    Code Status: DNR Family Communication: No family present at bedside   Disposition Plan:  Level of care: Progressive Status is: Inpatient Remains inpatient appropriate because: Needs further clinical improvement in her respiratory status and will need SNF once medically stable   Consultants:  Gastroenterology Infectious  Diseases Palliative Care Medicine Cardiology  PCCM/Pulmonary   Procedures:  EGD Findings:      Post nasal drainage and blood was seen in the posterior oropharynx.      Patchy, yellow plaques were found in the entire esophagus. Biopsies were       taken with a cold forceps for histology.      The entire examined stomach was normal.      The cardia and gastric fundus were normal on retroflexion.      A large polypoid mass with no bleeding was found in the second portion       of the duodenum. Biopsies were taken with a cold forceps for histology. Impression:               - Post nasal drainage is seen in the oropharynx.                           - Esophageal plaques were found, suspicious for                            candidiasis. Biopsied.                           - Normal stomach.                           - Rule out malignancy, duodenal mass. Biopsied.                           No obviouse source in the upper GI tract for                            bleed/hematemeis, likely source is bleeding from                            posterior oropharynx/epistaxis Recommendation:            - Resume previous diet.                           - Continue present medications.                           - Await pathology results.                           - Diflucan (fluconazole) 100 mg PO daily for 3                            weeks.                           - Please consult ENT for epistaxis                           - Ok to restart heparin from GI standpoint  Antimicrobials:  Anti-infectives (From admission, onward)    Start     Dose/Rate Route Frequency Ordered Stop   04/02/22 1000  levofloxacin (LEVAQUIN) tablet 250 mg  Status:  Discontinued       See Hyperspace for full Linked Orders Report.   250 mg Oral Daily 03/26/22 1624 03/31/22 1014   04/01/22 1000  fluconazole (DIFLUCAN) tablet 200 mg        200 mg Oral Daily 03/31/22 1606 04/21/22 0959   03/31/22 1000  fluconazole (DIFLUCAN) tablet 100 mg  Status:  Discontinued        100 mg Oral Daily 03/31/22 0843 03/31/22 1606   03/27/22 2200  levofloxacin (LEVAQUIN) IVPB 750 mg  Status:  Discontinued        750 mg 100 mL/hr over 90 Minutes Intravenous Every 48 hours 03/26/22 0919 03/26/22 1624   03/27/22 2200  levofloxacin (LEVAQUIN) IVPB 750 mg       See Hyperspace for full Linked Orders Report.   750 mg 100 mL/hr over 90 Minutes Intravenous Every 48 hours 03/26/22 1624 04/01/22 0006   03/26/22 1215  sulfamethoxazole-trimethoprim (BACTRIM DS) 800-160 MG per tablet 1 tablet  Status:  Discontinued        1 tablet Oral Once per day on Mon Wed Fri 03/26/22 1125 03/28/2022 0749   03/13/2022 2200  levofloxacin (LEVAQUIN) tablet 750 mg  Status:  Discontinued        750 mg Oral Every 48 hours 03/16/2022 1312 03/26/22 0919   03/12/2022 1215  doxycycline (VIBRA-TABS) tablet 100 mg  Status:  Discontinued        100 mg Oral Every 12 hours 03/13/2022 1128 04/01/2022 1312   03/23/22 1300  vancomycin (VANCOREADY) IVPB 750 mg/150 mL  Status:  Discontinued        750 mg 150 mL/hr over 60 Minutes Intravenous Every 24 hours 03/23/2022 1212  03/23/22 0902   03/23/22 1300  vancomycin (VANCOREADY) IVPB 750 mg/150 mL        750 mg 150 mL/hr over 60 Minutes Intravenous Every 24 hours 03/23/22 0902 03/23/22 1442   03/29/2022 2200  piperacillin-tazobactam (ZOSYN) IVPB 3.375 g  Status:  Discontinued        3.375 g 12.5 mL/hr over 240 Minutes Intravenous Every 8 hours 03/29/2022 1447 03/29/2022 1652   03/21/2022 2200  ceFEPIme (MAXIPIME) 2 g in sodium chloride 0.9 % 100 mL IVPB  Status:  Discontinued        2 g 200 mL/hr over 30 Minutes Intravenous Every 12 hours 03/08/2022 1652 03/15/2022 1312   04/06/2022 2200  metroNIDAZOLE (FLAGYL) IVPB 500 mg  Status:  Discontinued        500 mg 100 mL/hr over 60 Minutes Intravenous Every 12 hours 03/17/2022 1652 04/04/2022 1128   03/17/2022 2200  acyclovir (ZOVIRAX) tablet 400 mg        400 mg Oral 2 times daily 03/20/2022 1807     04/01/2022 1430  piperacillin-tazobactam (ZOSYN) IVPB 3.375 g        3.375 g 100 mL/hr over 30 Minutes Intravenous  Once 03/14/2022 1430 03/15/2022 1953   03/21/2022 1145  vancomycin (VANCOREADY) IVPB 1250 mg/250 mL        1,250 mg 166.7 mL/hr over 90 Minutes Intravenous  Once 03/08/2022 1130 03/12/2022 1559   03/23/2022 1130  ceFEPIme (MAXIPIME) 1 g in sodium chloride 0.9 % 100 mL IVPB  Status:  Discontinued        1 g 200 mL/hr over 30 Minutes Intravenous  Once 03/29/2022 1118 04/04/2022 1129   03/10/2022 1130  ceFEPIme (MAXIPIME) 2 g in sodium chloride 0.9 % 100 mL IVPB        2  g 200 mL/hr over 30 Minutes Intravenous  Once 04/07/2022 1129 03/17/2022 1330   03/12/2022 1115  cefTRIAXone (ROCEPHIN) 1 g in sodium chloride 0.9 % 100 mL IVPB  Status:  Discontinued        1 g 200 mL/hr over 30 Minutes Intravenous  Once 03/17/2022 1114 03/29/2022 1117   03/19/2022 1115  azithromycin (ZITHROMAX) 500 mg in sodium chloride 0.9 % 250 mL IVPB  Status:  Discontinued        500 mg 250 mL/hr over 60 Minutes Intravenous  Once 03/21/2022 1114 03/19/2022 1117       Subjective: Seen and examined at bedside and was doing okay but  appeared a little swollen.  Wanting to eat well but more so diet has been advanced.  Appeared short of breath so we will be giving her a little bit of Lasix.  Will hold further fluid.  No nausea or vomiting.  Denies any other concerns or complaints at this time.  Objective: Vitals:   04/01/22 0400 04/01/22 0828 04/01/22 1203 04/01/22 1657  BP: (!) 90/49 (!) 112/90 107/63 (!) 88/41  Pulse: 74 73    Resp: 20 20 (!) 22 17  Temp: 98.1 F (36.7 C) 98.1 F (36.7 C) 97.6 F (36.4 C) (!) 97.5 F (36.4 C)  TempSrc: Oral Oral Oral Oral  SpO2: 96% 97%    Weight:      Height:        Intake/Output Summary (Last 24 hours) at 04/01/2022 1801 Last data filed at 04/01/2022 1221 Gross per 24 hour  Intake 942.78 ml  Output --  Net 942.78 ml   Filed Weights   03/23/22 1225 04/06/2022 1356  Weight: 57.3 kg 57.3 kg   Examination: Physical Exam:  Constitutional: Elderly chronically ill-appearing, Caucasian female currently in no acute distress appears a little dyspneic Respiratory: Diminished to auscultation bilaterally with coarse breath sounds and has some slight rhonchi crackles, no wheezing, rales, rhonchi or crackles.  Slightly tachypneic but unlabored breathing wearing supplemental oxygen via nasal cannula Cardiovascular: RRR, no murmurs / rubs / gallops. S1 and S2 auscultated.  She has 1+ lower extremity pitting edema has some upper extremity swelling as well. Abdomen: Soft, non-tender, non-distended. Bowel sounds positive.  GU: Deferred. Musculoskeletal: No clubbing / cyanosis of digits/nails. No joint deformity upper and lower extremities. Skin: No rashes, lesions, ulcers on limited skin evaluation. No induration; Warm and dry.  Neurologic: CN 2-12 grossly intact with no focal deficits. Romberg sign and cerebellar reflexes not assessed.  Psychiatric: Impaired judgment and insight. Alert and oriented x 2  Data Reviewed: I have personally reviewed following labs and imaging  studies  CBC: Recent Labs  Lab 03/28/22 1257 03/29/22 1140 03/27/2022 0352 04/02/2022 1411 03/31/22 0726 04/01/22 0135  WBC 4.7 4.4 3.5*  --  3.6* 4.3  NEUTROABS  --   --   --   --   --  3.5  HGB 8.1* 8.0* 7.7* 7.7* 8.3* 7.6*  HCT 25.1* 24.9* 23.9* 25.6* 25.8* 24.3*  MCV 100.0 100.4* 103.5*  --  101.2* 103.8*  PLT 102* 92* 82*  --  106* 419*   Basic Metabolic Panel: Recent Labs  Lab 03/27/22 0144 03/28/22 1257 03/29/22 1140 04/07/2022 0426 03/31/22 0726 04/01/22 0135  NA  --  136 137 137 138 138  K  --  4.5 4.4 4.3 4.3 4.1  CL  --  110 110 110 109 108  CO2  --  19* 20* 20* 21* 20*  GLUCOSE  --  132* 137* 97 125* 129*  BUN  --  '13 13 13 13 14  '$ CREATININE  --  0.86 0.85 0.92 0.97 0.86  CALCIUM  --  6.9* 6.9* 6.9* 6.7* 6.5*  MG 1.8  --   --   --   --  1.8  PHOS  --   --   --   --   --  1.5*   GFR: Estimated Creatinine Clearance: 41.3 mL/min (by C-G formula based on SCr of 0.86 mg/dL). Liver Function Tests: Recent Labs  Lab 03/26/22 0205 04/01/22 0135  AST 12* 11*  ALT 11 12  ALKPHOS 50 43  BILITOT 1.1 0.5  PROT 4.8* 5.2*  ALBUMIN 2.0* 2.5*   No results for input(s): "LIPASE", "AMYLASE" in the last 168 hours. No results for input(s): "AMMONIA" in the last 168 hours. Coagulation Profile: No results for input(s): "INR", "PROTIME" in the last 168 hours. Cardiac Enzymes: No results for input(s): "CKTOTAL", "CKMB", "CKMBINDEX", "TROPONINI" in the last 168 hours. BNP (last 3 results) No results for input(s): "PROBNP" in the last 8760 hours. HbA1C: No results for input(s): "HGBA1C" in the last 72 hours. CBG: No results for input(s): "GLUCAP" in the last 168 hours. Lipid Profile: No results for input(s): "CHOL", "HDL", "LDLCALC", "TRIG", "CHOLHDL", "LDLDIRECT" in the last 72 hours. Thyroid Function Tests: No results for input(s): "TSH", "T4TOTAL", "FREET4", "T3FREE", "THYROIDAB" in the last 72 hours. Anemia Panel: No results for input(s): "VITAMINB12", "FOLATE",  "FERRITIN", "TIBC", "IRON", "RETICCTPCT" in the last 72 hours. Sepsis Labs: No results for input(s): "PROCALCITON", "LATICACIDVEN" in the last 168 hours.  Recent Results (from the past 240 hour(s))  Body fluid culture w Gram Stain     Status: None   Collection Time: 03/24/2022  3:07 PM   Specimen: Thoracentesis; Body Fluid  Result Value Ref Range Status   Specimen Description THORACENTESIS  Final   Special Requests NONE  Final   Gram Stain   Final    WBC PRESENT,BOTH PMN AND MONONUCLEAR NO ORGANISMS SEEN CYTOSPIN SMEAR    Culture   Final    NO GROWTH 3 DAYS Performed at [facility], [address] [place], [place] [address]    Report Status 03/29/2022 FINAL  Final    Radiology Studies: DG CHEST PORT 1 VIEW  Result Date: 04/01/2022 CLINICAL DATA:  Shortness of breath. EXAM: PORTABLE CHEST 1 VIEW COMPARISON:  03/27/2022. FINDINGS: 0458 hours. Increased moderate right and small left pleural effusions. No pneumothorax. Similar airspace opacities right lung. Stable mild cardiomegaly and mediastinal contours. Old rib fracture deformity of the left chest wall. Prior vertebral augmentation at multiple levels and unchanged vertebral compression deformities. IMPRESSION: Increased moderate right and small left pleural effusions. No pneumothorax. Similar airspace opacities in the right mid lung. Electronically Signed   By: Emmit Alexanders M.D.   On: 04/01/2022 08:20   VAS [place] UPPER EXTREMITY VENOUS DUPLEX  Result Date: 03/31/2022 UPPER VENOUS STUDY  Patient Name:  NASHIA REMUS  Date of Exam:   03/31/2022 Medical Rec #: [id number]         Accession #:    [id number] Date of Birth: Feb 21, 1941         Patient Gender: F Patient Age:   52 years Exam Location:  [facility] Procedure:      VAS [place] UPPER EXTREMITY VENOUS DUPLEX Referring Phys: Laurey Arrow --------------------------------------------------------------------------------  Indications: Swelling Risk Factors: None identified.  Limitations: Poor ultrasound/tissue interface. Comparison Study: No prior studies. Performing Technologist: Belenda Cruise  Collins RVT  Examination Guidelines: A complete evaluation includes B-mode imaging, spectral Doppler, color Doppler, and power Doppler as needed of all accessible portions of each vessel. Bilateral testing is considered an integral part of a complete examination. Limited examinations for reoccurring indications may be performed as noted.  Right Findings: +----------+------------+---------+-----------+----------+-------+ RIGHT     CompressiblePhasicitySpontaneousPropertiesSummary +----------+------------+---------+-----------+----------+-------+ Subclavian    Full       Yes       Yes                      +----------+------------+---------+-----------+----------+-------+  Left Findings: +----------+------------+---------+-----------+----------+-------+ LEFT      CompressiblePhasicitySpontaneousPropertiesSummary +----------+------------+---------+-----------+----------+-------+ IJV           Full       Yes       Yes                      +----------+------------+---------+-----------+----------+-------+ Subclavian    Full       Yes       Yes                      +----------+------------+---------+-----------+----------+-------+ Axillary      Full       Yes       Yes                      +----------+------------+---------+-----------+----------+-------+ Brachial      Full       Yes       Yes                      +----------+------------+---------+-----------+----------+-------+ Radial        Full                                          +----------+------------+---------+-----------+----------+-------+ Ulnar         Full                                          +----------+------------+---------+-----------+----------+-------+ Cephalic      None                                   Acute   +----------+------------+---------+-----------+----------+-------+ Basilic       Full                                          +----------+------------+---------+-----------+----------+-------+ Thrombus located in the cephalic vein is noted to extend from the [facility] into the mid forearm.  Summary:  Right: No evidence of thrombosis in the subclavian.  Left: No evidence of deep vein thrombosis in the upper extremity. Findings consistent with acute superficial vein thrombosis involving the left cephalic vein.  *See table(s) above for measurements and observations.  Diagnosing physician: Harold Barban MD Electronically signed by Harold Barban MD on 03/31/2022 at 8:55:52 PM.    Final     Scheduled Meds:  acetaminophen  1,000 mg Oral TID   acyclovir  400 mg Oral BID   amiodarone  400 mg Oral BID   Chlorhexidine Gluconate Cloth  6 each Topical Daily   dextromethorphan-guaiFENesin  1 tablet Oral BID   fluconazole  200 mg Oral Daily   levothyroxine  137 mcg Oral QAC breakfast   oxymetazoline  1 spray Each Nare BID   pantoprazole  40 mg Intravenous Q12H   phosphorus  500 mg Oral BID   sodium chloride flush  3 mL Intravenous Q12H   sodium chloride flush  3 mL Intravenous Q12H   Continuous Infusions:  sodium chloride 10 mL/hr at 03/31/22 0513   heparin 1,000 Units/hr (04/01/22 1636)    LOS: 10 days   Raiford Noble, DO [facility] Available via Epic secure chat 7am-7pm After these hours, please refer to coverage provider listed on [email or web address] 04/01/2022, 6:01 PM

## 2022-04-02 ENCOUNTER — Inpatient Hospital Stay (HOSPITAL_COMMUNITY): Payer: Medicare Other

## 2022-04-02 DIAGNOSIS — D649 Anemia, unspecified: Secondary | ICD-10-CM

## 2022-04-02 DIAGNOSIS — R0609 Other forms of dyspnea: Secondary | ICD-10-CM

## 2022-04-02 DIAGNOSIS — F419 Anxiety disorder, unspecified: Secondary | ICD-10-CM

## 2022-04-02 DIAGNOSIS — J189 Pneumonia, unspecified organism: Secondary | ICD-10-CM | POA: Diagnosis not present

## 2022-04-02 DIAGNOSIS — J90 Pleural effusion, not elsewhere classified: Secondary | ICD-10-CM | POA: Diagnosis not present

## 2022-04-02 DIAGNOSIS — E890 Postprocedural hypothyroidism: Secondary | ICD-10-CM

## 2022-04-02 DIAGNOSIS — Z515 Encounter for palliative care: Secondary | ICD-10-CM | POA: Diagnosis not present

## 2022-04-02 DIAGNOSIS — J851 Abscess of lung with pneumonia: Secondary | ICD-10-CM | POA: Diagnosis not present

## 2022-04-02 DIAGNOSIS — C9000 Multiple myeloma not having achieved remission: Secondary | ICD-10-CM | POA: Diagnosis not present

## 2022-04-02 DIAGNOSIS — D696 Thrombocytopenia, unspecified: Secondary | ICD-10-CM | POA: Diagnosis not present

## 2022-04-02 LAB — COMPREHENSIVE METABOLIC PANEL
ALT: 11 U/L (ref 0–44)
AST: 15 U/L (ref 15–41)
Albumin: 2.5 g/dL — ABNORMAL LOW (ref 3.5–5.0)
Alkaline Phosphatase: 40 U/L (ref 38–126)
Anion gap: 11 (ref 5–15)
BUN: 16 mg/dL (ref 8–23)
CO2: 20 mmol/L — ABNORMAL LOW (ref 22–32)
Calcium: 6.2 mg/dL — CL (ref 8.9–10.3)
Chloride: 107 mmol/L (ref 98–111)
Creatinine, Ser: 0.92 mg/dL (ref 0.44–1.00)
GFR, Estimated: >60 mL/min (ref >60)
Glucose, Bld: 93 mg/dL (ref 70–99)
Potassium: 4.1 mmol/L (ref 3.5–5.1)
Sodium: 138 mmol/L (ref 135–145)
Total Bilirubin: 1 mg/dL (ref 0.3–1.2)
Total Protein: 4.9 g/dL — ABNORMAL LOW (ref 6.5–8.1)

## 2022-04-02 LAB — CBC WITH DIFFERENTIAL/PLATELET
Abs Immature Granulocytes: 0.04 10*3/uL (ref 0.00–0.07)
Basophils Absolute: 0 10*3/uL (ref 0.0–0.1)
Basophils Relative: 0 %
Eosinophils Absolute: 0 10*3/uL (ref 0.0–0.5)
Eosinophils Relative: 0 %
HCT: 22.4 % — ABNORMAL LOW (ref 36.0–46.0)
Hemoglobin: 7.2 g/dL — ABNORMAL LOW (ref 12.0–15.0)
Immature Granulocytes: 1 %
Lymphocytes Relative: 10 %
Lymphs Abs: 0.3 10*3/uL — ABNORMAL LOW (ref 0.7–4.0)
MCH: 32.7 pg (ref 26.0–34.0)
MCHC: 32.1 g/dL (ref 30.0–36.0)
MCV: 101.8 fL — ABNORMAL HIGH (ref 80.0–100.0)
Monocytes Absolute: 0.2 10*3/uL (ref 0.1–1.0)
Monocytes Relative: 6 %
Neutro Abs: 2.3 10*3/uL (ref 1.7–7.7)
Neutrophils Relative %: 83 %
Platelets: 79 10*3/uL — ABNORMAL LOW (ref 150–400)
RBC: 2.2 MIL/uL — ABNORMAL LOW (ref 3.87–5.11)
RDW: 22.9 % — ABNORMAL HIGH (ref 11.5–15.5)
WBC: 2.8 10*3/uL — ABNORMAL LOW (ref 4.0–10.5)
nRBC: 1.1 % — ABNORMAL HIGH (ref 0.0–0.2)

## 2022-04-02 LAB — HEPARIN LEVEL (UNFRACTIONATED): Heparin Unfractionated: 0.56 IU/mL (ref 0.30–0.70)

## 2022-04-02 LAB — SURGICAL PATHOLOGY

## 2022-04-02 LAB — PHOSPHORUS: Phosphorus: 2.5 mg/dL (ref 2.5–4.6)

## 2022-04-02 LAB — MAGNESIUM: Magnesium: 2.3 mg/dL (ref 1.7–2.4)

## 2022-04-02 MED ORDER — AMIODARONE HCL 200 MG PO TABS: 200.0000 mg | ORAL_TABLET | Freq: Every day | ORAL | Status: DC

## 2022-04-02 MED ORDER — SODIUM CHLORIDE 0.9 % IV SOLN
3.0000 g | Freq: Three times a day (TID) | INTRAVENOUS | Status: DC
  Administered 2022-04-02: 3 g via INTRAVENOUS
  Filled 2022-04-02: qty 8

## 2022-04-02 MED ORDER — ALBUMIN HUMAN 25 % IV SOLN
25.0000 g | Freq: Once | INTRAVENOUS | Status: AC
  Administered 2022-04-02: 25 g via INTRAVENOUS
  Filled 2022-04-02: qty 100

## 2022-04-02 MED ORDER — LORAZEPAM 2 MG/ML IJ SOLN
1.0000 mg | INTRAMUSCULAR | Status: DC | PRN
  Administered 2022-04-03 – 2022-04-04 (×2): 1 mg via INTRAVENOUS
  Filled 2022-04-02 (×3): qty 1

## 2022-04-02 MED ORDER — MORPHINE SULFATE (PF) 2 MG/ML IV SOLN
1.0000 mg | INTRAVENOUS | Status: DC | PRN
  Administered 2022-04-02 – 2022-04-05 (×4): 1 mg via INTRAVENOUS
  Filled 2022-04-02 (×5): qty 1

## 2022-04-02 NOTE — Procedures (Signed)
Chest ultrasound:  Showing large right-sided pleural effusion

## 2022-04-02 NOTE — NC FL2 (Signed)
[place] LEVEL OF CARE FORM     IDENTIFICATION  Patient Name: Courtney Arnold Birthdate: May 18, 1940 Sex: female Admission Date (Current Location): 03/29/2022  [facility] and [place] Number:  Herbalist and Address:  [facility] [place] [address], [place], [place] [address]      Provider Number: [id number]  Attending Physician Name and Address:  Kerney Elbe, DO  Relative Name and Phone Number:  [name], [phone]    Current Level of Care: Hospital Recommended Level of Care: [place] Prior Approval Number:    Date Approved/Denied:   PASRR Number: [id number]  Discharge Plan: SNF    Current Diagnoses: Patient Active Problem List   Diagnosis Date Noted   Postprocedural pneumothorax 03/27/2022   Pleural effusion 03/24/2022   Lung abscess ([place]) 03/13/2022   Multifocal pneumonia with Lung Abscess 03/12/2022   Paroxysmal atrial fibrillation with RVR ([place]) 03/19/2022   DNR (do not resuscitate)/DNI(Do Not Intubate) 01/13/2022   Epistaxis 09/27/2021   Acute blood loss anemia 09/27/2021   Thrombocytopenia ([place]) 09/24/2021   Symptomatic anemia 09/22/2021   AKI (acute kidney injury) ([place]) 09/22/2021   Pulmonary infiltrates 09/22/2021   Pancytopenia, acquired -Multiple Myeloma Related 09/17/2020   Multiple myeloma not having achieved remission ([place]) 06/07/2019   Goals of care, counseling/discussion 06/07/2019   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   Vitamin B 12 deficiency 05/22/2019   Chronic anxiety 05/22/2019   Closed fracture of right femur ([place]) 05/10/2019   Hypothyroidism 05/10/2019   Unspecified atrial fibrillation ([place]) 05/10/2019   Anxiety 05/10/2019   Anemia, unspecified 05/10/2019   Sacral lytic lesions 05/10/2019   Bone lesion 05/08/2019   Osteoarthritis of knee 04/06/2019   Common bile duct dilation 09/01/2017   Lower back pain 09/01/2017   Vitamin D deficiency 02/03/2016    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   History of colonic polyps 11/06/2015   Thyroid-related proptosis 06/25/2015   History of biliary stent insertion 04/21/2015   AP (abdominal pain)    Compression fracture of L2 lumbar vertebra (HCC)    Abdominal pain 04/12/2015   TOBACCO ABUSE 08/21/2009   ATRIAL FIBRILLATION, HX OF 08/21/2009   Tobacco abuse 08/21/2009    Orientation RESPIRATION BLADDER Height & Weight     Self  O2 Incontinent, Indwelling catheter Weight: 126 lb 5.2 oz (57.3 kg) Height:  5' 2.01" (157.5 cm)  BEHAVIORAL SYMPTOMS/MOOD NEUROLOGICAL BOWEL NUTRITION STATUS      Continent Diet (See d/c summary)  AMBULATORY STATUS COMMUNICATION OF NEEDS Skin   Extensive Assist Verbally Normal                       Personal Care Assistance Level of Assistance  Bathing, Feeding, Dressing Bathing Assistance: Maximum assistance Feeding assistance: Limited assistance Dressing Assistance: Maximum assistance     Functional Limitations Info  Sight, Hearing, Speech Sight Info: Adequate Hearing Info: Adequate Speech Info: Adequate    SPECIAL CARE FACTORS FREQUENCY  PT (By licensed PT), OT (By licensed OT)     PT Frequency: 5x/wk OT Frequency: 5x/wk            Contractures Contractures Info: Not present    Additional Factors Info  Code Status Code Status Info: DNR             Current Medications (04/02/2022):  This is the current hospital active medication list Current Facility-Administered Medications  Medication Dose Route Frequency Provider Last Rate Last Admin  0.9 %  sodium chloride infusion   Intravenous PRN Roxan Hockey, MD 10 mL/hr at 03/31/22 0513 New Bag at 03/31/22 0513   acetaminophen (TYLENOL) tablet 650 mg  650 mg Oral Q6H PRN Roxan Hockey, MD       Or   acetaminophen (TYLENOL) suppository 650 mg  650 mg Rectal Q6H PRN Emokpae, Courage, MD       acetaminophen (TYLENOL) tablet 1,000 mg  1,000 mg Oral TID Earlie Counts,  NP   1,000 mg at 04/01/22 2341   acyclovir (ZOVIRAX) tablet 400 mg  400 mg Oral BID Roxan Hockey, MD   400 mg at 04/01/22 2343   albumin human 25 % solution 25 g  25 g Intravenous Once Sheikh, Meadow, DO       albuterol (PROVENTIL) (2.5 MG/3ML) 0.083% nebulizer solution 2.5 mg  2.5 mg Nebulization Q2H PRN Emokpae, Courage, MD       amiodarone (PACERONE) tablet 400 mg  400 mg Oral BID Janina Mayo, MD   400 mg at 04/01/22 2342   bisacodyl (DULCOLAX) suppository 10 mg  10 mg Rectal Daily PRN Roxan Hockey, MD       Chlorhexidine Gluconate Cloth 2 % PADS 6 each  6 each Topical Daily Debbe Odea, MD   6 each at 04/01/22 0944   dextromethorphan-guaiFENesin ([place] DM) 30-600 MG per 12 hr tablet 1 tablet  1 tablet Oral BID Roxan Hockey, MD   1 tablet at 04/01/22 2343   fluconazole (DIFLUCAN) tablet 200 mg  200 mg Oral Daily Raiford Noble Mylo, DO   200 mg at 04/01/22 0919   heparin ADULT infusion 100 units/mL (25000 units/213m)  950 Units/hr Intravenous Continuous MKris Mouton RPH 9.5 mL/hr at 04/02/22 0919 950 Units/hr at 04/02/22 0919   levothyroxine (SYNTHROID) tablet 137 mcg  137 mcg Oral QAC breakfast ERoxan Hockey MD   137 mcg at 04/02/22 0522   loperamide (IMODIUM) capsule 2 mg  2 mg Oral PRN RDebbe Odea MD   2 mg at 03/28/22 1035   ondansetron (ZOFRAN) tablet 4 mg  4 mg Oral Q6H PRN ERoxan Hockey MD       Or   ondansetron (ZOFRAN) injection 4 mg  4 mg Intravenous Q6H PRN EDenton Brick Courage, MD   4 mg at 03/28/22 0351   oxyCODONE (Oxy IR/ROXICODONE) immediate release tablet 5 mg  5 mg Oral Q4H PRN RDebbe Odea MD   5 mg at 04/01/22 2342   oxymetazoline (AFRIN) 0.05 % nasal spray 1 spray  1 spray Each Nare BID WGwynne Edinger MD   1 spray at 04/02/22 0016   pantoprazole (PROTONIX) injection 40 mg  40 mg Intravenous Q12H Sheikh, Omair Latif, DO   40 mg at 04/01/22 2344   polyethylene glycol (MIRALAX / GLYCOLAX) packet 17 g  17 g Oral Daily PRN Emokpae,  Courage, MD       polyvinyl alcohol (LIQUIFILM TEARS) 1.4 % ophthalmic solution 1 drop  1 drop Both Eyes PRN Emokpae, Courage, MD       sodium chloride flush (NS) 0.9 % injection 3 mL  3 mL Intravenous Q12H Emokpae, Courage, MD   3 mL at 04/02/22 0015   sodium chloride flush (NS) 0.9 % injection 3 mL  3 mL Intravenous Q12H Emokpae, Courage, MD   3 mL at 04/02/22 0015   sodium chloride flush (NS) 0.9 % injection 3 mL  3 mL Intravenous PRN ERoxan Hockey MD       traZODone (DSegundo  tablet 50 mg  50 mg Oral QHS PRN Roxan Hockey, MD   50 mg at 04/01/22 2340   Facility-Administered Medications Ordered in Other Encounters  Medication Dose Route Frequency Provider Last Rate Last Admin   0.9 %  sodium chloride infusion (Manually program via Guardrails IV Fluids)  250 mL Intravenous Once Derek Jack, MD       0.9 %  sodium chloride infusion   Intravenous Continuous Derek Jack, MD   Stopped at 09/15/21 1454     Discharge Medications: Please see discharge summary for a list of discharge medications.  Relevant Imaging Results:  Relevant Lab Results:   Additional Information SS#: [id number]  Jinger Neighbors, LCSW

## 2022-04-02 NOTE — Progress Notes (Signed)
PROGRESS NOTE    Courtney Arnold  GUR:[id number] DOB: 1940-08-22 DOA: 04/03/2022 PCP: Derek Jack, MD   Brief Narrative:  The patient is an 82 year old female with multiple myeloma, paroxysmal atrial fibrillation, COPD, pancytopenia required packed red blood cell transfusion as well as platelet transfusion, hypothyroidism as well as other comorbidities who presented to [facility] on 03/29/2022, vomiting and poor oral intake for last 3 weeks.  She underwent further imaging and CT chest abdomen pelvis revealed multifocal pneumonia and possible abscess in the right middle lobe with moderate right and small left pleural effusions.  She was transferred to [facility] on 03/23/2022 at the recommendation of pulmonary to obtain a pulmonary consult and she developed A-fib with RVR and started on amiodarone infusion.   On 03/24/2022 pulmonary recommended holding off bronchial containing antibiotics and switch IV amiodarone to oral   03/18/2022 she underwent a thoracentesis of the right pleural space with 200 cc of cloudy fluid obtained and she had a small iatrogenic pneumothorax.  ID was consulted she started on levofloxacin and there was a discussion with her oncologist as below   On 03/26/2022 she started having frequent small stools which continued and on 03/28/2022 heparin was resumed and Imodium frequency was increased.  On 04/02/2022 she had an episode of hematemesis and protamine given and anticoagulation was stopped.   On 03/28/2022 she is felt not to have capacity make her own medical decisions and goals of care discussion was held with her daughters and the attending physician at that time.  Attending physician Dr. Wynelle Cleveland felt that she had a very poor prognosis and was still not able to eat and having multiple loose stools along with her multiple myeloma, pancytopenia as well as new onset A-fib with RVR as well as pneumonia and lung abscess and transudative pleural effusion and slight  pneumothorax that she would have a poor outcome.  Family did not want to transition her to comfort care and wanted to treat the treatable.  Family felt that she could not go home.   After her episode of hematemesis she did have a nosebleed and GI scoped the patient and felt that the bleeding was from her nose given her history of epistaxis.  GI has since signed off the case but did find esophageal plaques suspicious for candidiasis and so she was started on fluconazole and the ID team increase this to 200 mg daily.     04/01/2022 she appears little volume overloaded so we will give her dose of IV Lasix just 20 mg and given a dose of IV albumin 25 g as well.  Diet will be advanced and she has no further epistaxis so we will consider changing her from IV heparin to an oral anticoagulant but will wait to see if she requires a repeat thoracentesis.  May need to reengage pulmonary given that her right pleural effusion is slightly worsening.  Palliative care continue to have goals of care discussion.  **04/02/22: Further decline and became more hypoxic so oxygen requirements increased.  Repeat chest x-ray showed worsening of her chest x-ray and she was possibly aspirating so Unasyn was started.  Pulmonary was reconsulted and they did a bedside ultrasound which showed a very large pleural effusion.  Palliative care was reengaged and given her clinical worsening and after further family discussion a decision was made to transition the patient to full comfort measures and all medications not in the patient's comfort have been discontinued and she has been started on acetaminophen  of 5 mg 3 times daily, albuterol nebs, bisacodyl suppositories, Ativan IV anxiety IV morphine 1 mg every hour for moderate pain and dyspnea, ondansetron as well as Liquifilm Tears.  Patient has a high risk for further clinical decompensation and worsening in she is not transition to residential hospice anticipating in-hospital death given her  extremely poor prognosis and multiple medical comorbidities   Assessment and Plan:  Aspiration from vomiting at home but cannot rule out other infections as she is immune compromised -CT reveals bilateral multifocal nodules with consolidation in the right middle lobe, right moderate pleural effusion and small left pleural effusion-near complete resolution of previously noted left upper lobe consolidation  -ID has evaluated the patient and recommended oral levaquin and have now placed a stop date for this. Given ongoing loose stools they advise continuing through 1/25 and then trial off it -Continue with albuterol 2.5 mg nebs every 2 as needed for wheezing and shortness of breath, dextromethorphan-guaifenesin 1 tab p.o. twice daily, -Will add Flutter valve and incentive spirometry -Continue monitor respiratory status carefully and repeat chest x-ray in a.m. [name] was following and recommended antibiotics for 14 days as well as aspiration precautions and pulmonary hygiene -Pulmonary was reconsulted for repeat thoracentesis that she has a very large right-sided pleural effusion on ultrasound.  After further goals of care discussion with the patient and the family decision was made to transition the patient to full comfort measures and stopping all medications the patient's comfort K[place] on symptomatic management   Epistaxis causing Hematemesis, improving  -Has hx of nosebleeds and had one yesterday that was minor and resolved spontaneously.  Yesterday morning RN reported vomiting up blood after being given water. Has not had recurrence. Hgb 7.7 today from 8 yesterday. -GI evaluated and was n.p.o. yesterday for EGD and now on a clear liquid diet; EGD was done and there is no obvious source in the upper GI tract for bleeding or hematemesis and likely the source of bleeding from the posterior oropharynx and epistaxis -Now off lovenox, protamine given -Will try to wean off o2 as likely contributes to  nosebleed. If unable to do so will plan on humidified air. Will also continue Oxymetazoline 1 spray each nare BID  -Hgb/Hct Trend: Recent Labs  Lab 03/28/22 1257 03/29/22 1140 03/27/2022 0352 04/04/2022 1411 03/31/22 0726 04/01/22 0135 04/02/22 0234  HGB 8.1* 8.0* 7.7* 7.7* 8.3* 7.6* 7.2*  HCT 25.1* 24.9* 23.9* 25.6* 25.8* 24.3* 22.4*  MCV 100.0 100.4* 103.5*  --  101.2* 103.8* 101.8*  -Was on PPI gtt but will go to q12h -FOBT was Positive  -If she continues to currently have epistaxis we will consult ENT for further evaluation and recommendations however she is being transitioned to full comfort care now   Duodenal Mass -Large polypoid mass with no bleeding was found in the second portion of the duodenum and biopsies were taken with cold forceps for histology and these are currently pending   Iatrogenic right pneumothorax Acute Hypoxic respiratory failure -Post thoracentesis - improving but Re-occurring Pleural Effusion so will discuss with Pulmonary   -SpO2: 100 % O2 Flow Rate (L/min): 4 L/min FiO2 (%): 50 % -Levofloxacin for antibiotic coverage has been discontinued however given that there is concern that she was aspirating SLP was ordered and she was placed back on IV Unasyn. -Continue with albuterol 2.5 mg nebs every 2 as needed for wheezing and shortness of breath, dextromethorphan-guaifenesin 1 tab p.o. twice daily, -Pulmonary consulted for further evaluation and they did a  bedside ultrasound which showed a very large right pleural effusion.  After further goals of care discussion family does not want a repeat thoracentesis and she is being transitioned to comfort care.   Hypotension, blood pressure remains on the lower side -BP was on the softer side -She remains volume overloaded and hesitant to give more fluid.  Dose of IV albumin was ordered however she did not IV access. -Continue to Monitor and Trend BP's per Protocol -Last BP was 88/41 and on the softer side -Will order  TED hose and IV Albumin 25 g x1 -Subsequently she is being transitioned to full comfort care measures   Nausea/ vomiting with poor oral intake -She has had vomiting for few weeks and continued to do so in the hospital -Abdomen distended on exam but has been non tender  -CT scan on 1/16 which revealed a surgically absent gallbladder, CBD of 8 mm, small hiatal hernia with circumferential esophageal wall thickening suggestive of GERD or less likely esophageal neoplasm -The patient takes Decadron twice a week as outpt -Per family and RN, she vomits/regurgitates just within a few minutes of eating-  -SLP eval done- no signs of aspiration -No thrush noted on exam but noted to have Candidiasis on EGD so now on Fluconazole  -Will discontinue all medications not in the patient's comfort given that she is being transitioned to comfort care continue symptomatic management   Loose stool -Possibly from antibiotics- abdomen is still non tender so less likely it is infectious. This morning loose stool but not watery -C/w Imodium PRN   Multiple myeloma, relapsed Fatigue related to cancer -Receiving pomalidomide as outpatient with 1/16 to have been the date of her next cycle -Has lesions throughout her skeleton -Receives acyclovir prophylaxis twice a day, Decadron and Ritalin -Her oncologist has recommended to hold Pomalidomide & Decadron and follow-up with him as outpatient to decide on whether or not to resume these -Being transitioned to [place]    Paroxysmal atrial fibrillation with RVR -new diagnosis Pleural effusions; Right > Left  Mod pulm HTN- possibility of diastolic dysfunction -[id number] score is 4 -Transthoracic ECHO> EF 55-60%, unable to evaluate diastolic function, b/l atrial dilation, mild to mod TR, mo elevated pulm pressure, normal IV function with mild RV enlargement -Placed on IV amiodarone and transitioned to oral amiodarone   -Given a dose of IV Lasix 20 mg x 1 -Plan was  for 400 mg twice daily x 10 days (stop date 1/25), then 200 mg daily and then outpatient follow-up but will stop given transitioning to [place]  -Anticoagulation resumed but will discontinue that she is Comfort Care -Chest x-ray today showed "Increased airspace opacity in the right mid lung and right lung apex concerning for superimposed  pneumonia or asymmetric edema. Small bilateral pleural effusions. Increased interstitial accentuation in the left lung, potentially from interstitial edema or atypical pneumonia. Old left rib fractures. Thoracic spondylosis with remote lower thoracic compression fractures and multiple vertebral augmentations."   Acute Urinary Retention -RN says foley was placed for retention and Foley catheter was initially discontinued however she continues to retain so Foley catheter will be reinserted and will be maintained for comfort    Right Lower Extremity Swelling -Per daughter this is new. U/s 1/22 shows no signs DVT   Left Upper Extremity Swelling -U/S ordered was completed today and showed no evidence of the deep vein thrombosis in the upper extremites but findings are consistent with an acute superficial vein thrombosis involving the left cephalic  vein -Continue with elevation -Back on Anticoagulation but now will discontinue that patient is now [place], family interested in that. TOC consulted for assistance with placement   Hypothyroidism -TSH was 5.614 -Likely sick euthyroid -Discontinue Levothyroxine 137 mcg po Daily given that she is now comfort care all   Esophageal Candidiasis -Noted on EGD -GI recommending Fluconazole 100 mg po Daily x3 Weeks but this was increased to 200 mg po Daily -Will not repeat labs in the morning given that she is now fully comfort care   Metabolic Acidosis -Patient's CO2 is now 20, AG is 11, and Chloride Level is now 107 -Will not repeat labs in the morning given that she is now fully  comfort care   Hypophosphatemia -Phos Level Trend: Recent Labs  Lab 03/23/22 0500 04/01/22 0135 04/02/22 0234  PHOS 2.8 1.5* 2.5  -Replete with po K Phos Neutral 500 mg po BID x 2  -Continue to Monitor and Replete as Necessary -Will not repeat labs in the morning given that she is now fully comfort care   Pancytopenia in the Setting of Multiple Myeloma -CBC Trend: Recent Labs  Lab 03/27/22 0117 03/28/22 1257 03/29/22 1140 03/26/2022 0352 03/08/2022 1411 03/31/22 0726 04/01/22 0135 04/02/22 0234  WBC 5.2 4.7 4.4 3.5*  --  3.6* 4.3 2.8*  HGB 7.7* 8.1* 8.0* 7.7*   < > 8.3* 7.6* 7.2*  HCT 23.5* 25.1* 24.9* 23.9*   < > 25.8* 24.3* 22.4*  MCV 98.3 100.0 100.4* 103.5*  --  101.2* 103.8* 101.8*  PLT 100* 102* 92* 82*  --  106* 102* 79*   < > = values in this interval not displayed.  -Continue to Monitor for S/Sx of Bleeding; No overt bleeding noted now -Will not repeat labs in the morning given that she is now fully comfort care  DVT prophylaxis: None none the patient is fully comfort care    Code Status: DNR Family Communication: Discussed with daughter [name] at bedside  Disposition Plan:  Level of care: Progressive Status is: Inpatient Remains inpatient appropriate because:    Consultants:  Gastroenterology Infectious Diseases Palliative Care Medicine Cardiology  PCCM/Pulmonary     Procedures:  EGD Findings:      Post nasal drainage and blood was seen in the posterior oropharynx.      Patchy, yellow plaques were found in the entire esophagus. Biopsies were       taken with a cold forceps for histology.      The entire examined stomach was normal.      The cardia and gastric fundus were normal on retroflexion.      A large polypoid mass with no bleeding was found in the second portion       of the duodenum. Biopsies were taken with a cold forceps for histology. Impression:               - Post nasal drainage is seen in the oropharynx.                           -  Esophageal plaques were found, suspicious for                            candidiasis. Biopsied.                           -  Normal stomach.                           - Rule out malignancy, duodenal mass. Biopsied.                           No obviouse source in the upper GI tract for                            bleed/hematemeis, likely source is bleeding from                            posterior oropharynx/epistaxis Recommendation:           - Resume previous diet.                           - Continue present medications.                           - Await pathology results.                           - Diflucan (fluconazole) 100 mg PO daily for 3                            weeks.                           - Please consult ENT for epistaxis                           - Ok to restart heparin from GI standpoint   Thoracentesis   Antimicrobials:  Anti-infectives (From admission, onward)    Start     Dose/Rate Route Frequency Ordered Stop   04/02/22 1230  Ampicillin-Sulbactam (UNASYN) 3 g in sodium chloride 0.9 % 100 mL IVPB  Status:  Discontinued        3 g 200 mL/hr over 30 Minutes Intravenous Every 8 hours 04/02/22 1130 04/02/22 1312   04/02/22 1000  levofloxacin (LEVAQUIN) tablet 250 mg  Status:  Discontinued       See Hyperspace for full Linked Orders Report.   250 mg Oral Daily 03/26/22 1624 03/31/22 1014   04/01/22 1000  fluconazole (DIFLUCAN) tablet 200 mg  Status:  Discontinued        200 mg Oral Daily 03/31/22 1606 04/02/22 1317   03/31/22 1000  fluconazole (DIFLUCAN) tablet 100 mg  Status:  Discontinued        100 mg Oral Daily 03/31/22 0843 03/31/22 1606   03/27/22 2200  levofloxacin (LEVAQUIN) IVPB 750 mg  Status:  Discontinued        750 mg 100 mL/hr over 90 Minutes Intravenous Every 48 hours 03/26/22 0919 03/26/22 1624   03/27/22 2200  levofloxacin (LEVAQUIN) IVPB 750 mg       See Hyperspace for full Linked Orders Report.   750 mg 100 mL/hr over 90 Minutes Intravenous  Every 48 hours 03/26/22 1624 04/01/22 0006   03/26/22 1215  sulfamethoxazole-trimethoprim (BACTRIM DS) 800-160 MG per tablet 1 tablet  Status:  Discontinued  1 tablet Oral Once per day on Mon Wed Fri 03/26/22 1125 03/21/2022 0749   03/26/2022 2200  levofloxacin (LEVAQUIN) tablet 750 mg  Status:  Discontinued        750 mg Oral Every 48 hours 03/11/2022 1312 03/26/22 0919   03/10/2022 1215  doxycycline (VIBRA-TABS) tablet 100 mg  Status:  Discontinued        100 mg Oral Every 12 hours 03/14/2022 1128 03/19/2022 1312   03/23/22 1300  vancomycin (VANCOREADY) IVPB 750 mg/150 mL  Status:  Discontinued        750 mg 150 mL/hr over 60 Minutes Intravenous Every 24 hours 03/21/2022 1212 03/23/22 0902   03/23/22 1300  vancomycin (VANCOREADY) IVPB 750 mg/150 mL        750 mg 150 mL/hr over 60 Minutes Intravenous Every 24 hours 03/23/22 0902 03/23/22 1442   04/01/2022 2200  piperacillin-tazobactam (ZOSYN) IVPB 3.375 g  Status:  Discontinued        3.375 g 12.5 mL/hr over 240 Minutes Intravenous Every 8 hours 03/11/2022 1447 04/07/2022 1652   03/10/2022 2200  ceFEPIme (MAXIPIME) 2 g in sodium chloride 0.9 % 100 mL IVPB  Status:  Discontinued        2 g 200 mL/hr over 30 Minutes Intravenous Every 12 hours 03/11/2022 1652 03/23/2022 1312   03/20/2022 2200  metroNIDAZOLE (FLAGYL) IVPB 500 mg  Status:  Discontinued        500 mg 100 mL/hr over 60 Minutes Intravenous Every 12 hours 03/27/2022 1652 03/12/2022 1128   03/28/2022 2200  acyclovir (ZOVIRAX) tablet 400 mg  Status:  Discontinued        400 mg Oral 2 times daily 03/21/2022 1807 04/02/22 1312   03/17/2022 1430  piperacillin-tazobactam (ZOSYN) IVPB 3.375 g        3.375 g 100 mL/hr over 30 Minutes Intravenous  Once 03/16/2022 1430 04/02/2022 1953   04/06/2022 1145  vancomycin (VANCOREADY) IVPB 1250 mg/250 mL        1,250 mg 166.7 mL/hr over 90 Minutes Intravenous  Once 03/11/2022 1130 03/24/2022 1559   03/21/2022 1130  ceFEPIme (MAXIPIME) 1 g in sodium chloride 0.9 % 100 mL IVPB  Status:   Discontinued        1 g 200 mL/hr over 30 Minutes Intravenous  Once 03/23/2022 1118 03/28/2022 1129   04/06/2022 1130  ceFEPIme (MAXIPIME) 2 g in sodium chloride 0.9 % 100 mL IVPB        2 g 200 mL/hr over 30 Minutes Intravenous  Once 03/24/2022 1129 03/23/2022 1330   04/07/2022 1115  cefTRIAXone (ROCEPHIN) 1 g in sodium chloride 0.9 % 100 mL IVPB  Status:  Discontinued        1 g 200 mL/hr over 30 Minutes Intravenous  Once 03/13/2022 1114 03/29/2022 1117   03/11/2022 1115  azithromycin (ZITHROMAX) 500 mg in sodium chloride 0.9 % 250 mL IVPB  Status:  Discontinued        500 mg 250 mL/hr over 60 Minutes Intravenous  Once 03/27/2022 1114 04/06/2022 1117       Subjective: Seen and examined at bedside and she was feeling fatigued appeared more short of breath.  Ate a little bit of her cheeseburger last night.  Blood pressure remains on the low side.  Chest x-ray worsening and oxygen requirements are going up.  She denies any current pain.  No other concerns or complaints at this time and appears worse than yesterday.  Objective: Vitals:   04/02/22 1000 04/02/22 1100 04/02/22  1333 04/02/22 1541  BP: (!) 84/38 (!) 73/55  (!) 95/42  Pulse: 73 72  72  Resp: (!) 28 20 (!) 35 20  Temp:  97.6 F (36.4 C)  (!) 97.2 F (36.2 C)  TempSrc:  Axillary  Axillary  SpO2: 100% 95%  100%  Weight:      Height:        Intake/Output Summary (Last 24 hours) at 04/02/2022 1616 Last data filed at 04/02/2022 5093 Gross per 24 hour  Intake 285.53 ml  Output 1225 ml  Net -939.47 ml   Filed Weights   03/23/22 1225 03/14/2022 1356  Weight: 57.3 kg 57.3 kg   Examination: Physical Exam:  Constitutional: Elderly chronically ill-appearing Caucasian female who is in some respiratory distress appears dyspneic and more fatigued Respiratory: Diminished to auscultation bilaterally with coarse breath sounds and some rhonchi and crackles noted.  No appreciable wheezing but has an increased oxygen requirement and wearing 4 L of  supplemental oxygen via nasal cannula. Cardiovascular: Tachycardic rate but regular rhythm, no murmurs / rubs / gallops. S1 and S2 auscultated. No extremity edema.  She has 1+ lower extremity pitting edema Abdomen: Soft, non-tender, non-distended. Bowel sounds positive.  GU: Deferred. Musculoskeletal: No clubbing / cyanosis of digits/nails. No joint deformity upper and lower extremities.  Skin: No rashes, lesions, ulcers on limited skin evaluation. No induration; Warm and dry.  Neurologic: CN 2-12 grossly intact with no focal deficits. Romberg sign cerebellar reflexes not assessed.  Psychiatric: Awake and Alert and oriented x 2  Data Reviewed: I have personally reviewed following labs and imaging studies  CBC: Recent Labs  Lab 03/29/22 1140 03/09/2022 0352 04/01/2022 1411 03/31/22 0726 04/01/22 0135 04/02/22 0234  WBC 4.4 3.5*  --  3.6* 4.3 2.8*  NEUTROABS  --   --   --   --  3.5 2.3  HGB 8.0* 7.7* 7.7* 8.3* 7.6* 7.2*  HCT 24.9* 23.9* 25.6* 25.8* 24.3* 22.4*  MCV 100.4* 103.5*  --  101.2* 103.8* 101.8*  PLT 92* 82*  --  106* 102* 79*   Basic Metabolic Panel: Recent Labs  Lab 03/27/22 0144 03/28/22 1257 03/29/22 1140 03/08/2022 0426 03/31/22 0726 04/01/22 0135 04/02/22 0234  NA  --    < > 137 137 138 138 138  K  --    < > 4.4 4.3 4.3 4.1 4.1  CL  --    < > 110 110 109 108 107  CO2  --    < > 20* 20* 21* 20* 20*  GLUCOSE  --    < > 137* 97 125* 129* 93  BUN  --    < > '13 13 13 14 16  '$ CREATININE  --    < > 0.85 0.92 0.97 0.86 0.92  CALCIUM  --    < > 6.9* 6.9* 6.7* 6.5* 6.2*  MG 1.8  --   --   --   --  1.8 2.3  PHOS  --   --   --   --   --  1.5* 2.5   < > = values in this interval not displayed.   GFR: Estimated Creatinine Clearance: 38.6 mL/min (by C-G formula based on SCr of 0.92 mg/dL). Liver Function Tests: Recent Labs  Lab 04/01/22 0135 04/02/22 0234  AST 11* 15  ALT 12 11  ALKPHOS 43 40  BILITOT 0.5 1.0  PROT 5.2* 4.9*  ALBUMIN 2.5* 2.5*   No results for  input(s): "LIPASE", "AMYLASE" in the last  168 hours. No results for input(s): "AMMONIA" in the last 168 hours. Coagulation Profile: No results for input(s): "INR", "PROTIME" in the last 168 hours. Cardiac Enzymes: No results for input(s): "CKTOTAL", "CKMB", "CKMBINDEX", "TROPONINI" in the last 168 hours. BNP (last 3 results) No results for input(s): "PROBNP" in the last 8760 hours. HbA1C: No results for input(s): "HGBA1C" in the last 72 hours. CBG: No results for input(s): "GLUCAP" in the last 168 hours. Lipid Profile: No results for input(s): "CHOL", "HDL", "LDLCALC", "TRIG", "CHOLHDL", "LDLDIRECT" in the last 72 hours. Thyroid Function Tests: No results for input(s): "TSH", "T4TOTAL", "FREET4", "T3FREE", "THYROIDAB" in the last 72 hours. Anemia Panel: No results for input(s): "VITAMINB12", "FOLATE", "FERRITIN", "TIBC", "IRON", "RETICCTPCT" in the last 72 hours. Sepsis Labs: No results for input(s): "PROCALCITON", "LATICACIDVEN" in the last 168 hours.  Recent Results (from the past 240 hour(s))  Body fluid culture w Gram Stain     Status: None   Collection Time: 03/09/2022  3:07 PM   Specimen: Thoracentesis; Body Fluid  Result Value Ref Range Status   Specimen Description THORACENTESIS  Final   Special Requests NONE  Final   Gram Stain   Final    WBC PRESENT,BOTH PMN AND MONONUCLEAR NO ORGANISMS SEEN CYTOSPIN SMEAR    Culture   Final    NO GROWTH 3 DAYS Performed at [facility], [address] [place], [place] [address]    Report Status 03/29/2022 FINAL  Final    Radiology Studies: DG CHEST PORT 1 VIEW  Result Date: 04/02/2022 CLINICAL DATA:  Shortness of breath EXAM: PORTABLE CHEST 1 VIEW COMPARISON:  04/01/2022 FINDINGS: Blunting of both costophrenic angles, right greater than left, compatible with pleural effusions. Increased airspace opacity in the right mid lung and right lung apex concerning for superimposed pneumonia or asymmetric edema. Increased interstitial  accentuation in the left lung. Left rib deformities compatible with old fractures. Thoracic spondylosis with remote lower thoracic compression fractures and multiple vertebral augmentations. Atherosclerotic calcification of the aortic arch. IMPRESSION: 1. Increased airspace opacity in the right mid lung and right lung apex concerning for superimposed pneumonia or asymmetric edema. 2. Small bilateral pleural effusions. 3. Increased interstitial accentuation in the left lung, potentially from interstitial edema or atypical pneumonia. 4. Old left rib fractures. Thoracic spondylosis with remote lower thoracic compression fractures and multiple vertebral augmentations. Electronically Signed   By: Van Clines M.D.   On: 04/02/2022 08:25   DG CHEST PORT 1 VIEW  Result Date: 04/01/2022 CLINICAL DATA:  Shortness of breath. EXAM: PORTABLE CHEST 1 VIEW COMPARISON:  03/27/2022. FINDINGS: 0458 hours. Increased moderate right and small left pleural effusions. No pneumothorax. Similar airspace opacities right lung. Stable mild cardiomegaly and mediastinal contours. Old rib fracture deformity of the left chest wall. Prior vertebral augmentation at multiple levels and unchanged vertebral compression deformities. IMPRESSION: Increased moderate right and small left pleural effusions. No pneumothorax. Similar airspace opacities in the right mid lung. Electronically Signed   By: Emmit Alexanders M.D.   On: 04/01/2022 08:20    Scheduled Meds:  acetaminophen  1,000 mg Oral TID   Chlorhexidine Gluconate Cloth  6 each Topical Daily   pantoprazole  40 mg Intravenous Q12H   sodium chloride flush  3 mL Intravenous Q12H   sodium chloride flush  3 mL Intravenous Q12H   Continuous Infusions:  sodium chloride 10 mL/hr at 03/31/22 0513    LOS: 11 days   Raiford Noble, DO [facility] Available via Epic secure chat 7am-7pm  After these hours, please refer to coverage provider listed on [email or web address] 04/02/2022, 4:16 PM

## 2022-04-02 NOTE — TOC Initial Note (Addendum)
Transition of Care [facility]) - Initial/Assessment Note    Patient Details  Name: Courtney Arnold MRN: [id number] Date of Birth: Sep 25, 1940  Transition of Care [facility]) CM/SW Contact:    Jinger Neighbors, LCSW Phone Number: 04/02/2022, 9:23 AM  Clinical Narrative:                  Pt being worked up for SNF placement for STR. CSW conversed with pt's dtr, [name] regarding PT recommendations for SNF placement. Vivien Rota in agreement and reports, "whatever it takes to get Mom stronger and well". CSW discussed certain areas for SNF and Vivien Rota prefers [place] area and possibly [place].  CSW will complete work up and fax out.  W/U and fax out complete   Expected Discharge Plan: [place] Barriers to Discharge: Continued Medical Work up   Patient Goals and CMS Choice Patient states their goals for this hospitalization and ongoing recovery are:: Patient's dtr reports she would like for pt to get better, "so whatever it takes". CMS Medicare.gov Compare Post Acute Care list provided to:: Patient Represenative (must comment) (pt's dtr [name]) Choice offered to / list presented to : Adult [place] ownership interest in [facility].provided to:: Adult Children    Expected Discharge Plan and Services In-house Referral: Clinical Social Work     Living arrangements for the past 2 months: Single Family Home                                      Prior Living Arrangements/Services Living arrangements for the past 2 months: Single Family Home Lives with:: Self Patient language and need for interpreter reviewed:: No Do you feel safe going back to the place where you live?: Yes      Need for Family Participation in Patient Care: Yes (Comment) Care giver support system in place?: Yes (comment)   Criminal Activity/Legal Involvement Pertinent to Current Situation/Hospitalization: No - Comment as needed  Activities of Daily Living      Permission  Sought/Granted Permission sought to share information with : Family Supports    Share Information with NAME: [name] [facility])  Permission granted to share info w AGENCY: SNFs  Permission granted to share info w Relationship: daughter  Permission granted to share info w Contact Information: [phone]  Emotional Assessment       Orientation: : Oriented to Place, Oriented to Self, Oriented to Situation Alcohol / Substance Use: Not Applicable Psych Involvement: No (comment)  Admission diagnosis:  Lung abscess ([place]) [J85.2] Atrial fibrillation with rapid ventricular response ([place]) [I48.91] Abscess of middle lobe of right lung with pneumonia ([place]) [J85.1] Patient Active Problem List   Diagnosis Date Noted   Postprocedural pneumothorax 03/27/2022   Pleural effusion 03/17/2022   Lung abscess ([place]) 04/06/2022   Multifocal pneumonia with Lung Abscess 03/12/2022   Paroxysmal atrial fibrillation with RVR ([place]) 04/03/2022   DNR (do not resuscitate)/DNI(Do Not Intubate) 01/13/2022   Epistaxis 09/27/2021   Acute blood loss anemia 09/27/2021   Thrombocytopenia ([place]) 09/24/2021   Symptomatic anemia 09/22/2021   AKI (acute kidney injury) ([place]) 09/22/2021   Pulmonary infiltrates 09/22/2021   Pancytopenia, acquired -Multiple Myeloma Related 09/17/2020   Multiple myeloma not having achieved remission ([place]) 06/07/2019   Goals of care, counseling/discussion 06/07/2019   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   Vitamin B 12 deficiency 05/22/2019   Chronic anxiety 05/22/2019   Closed fracture of  right femur ([place]) 05/10/2019   Hypothyroidism 05/10/2019   Unspecified atrial fibrillation ([place]) 05/10/2019   Anxiety 05/10/2019   Anemia, unspecified 05/10/2019   Sacral lytic lesions 05/10/2019   Bone lesion 05/08/2019   Osteoarthritis of knee 04/06/2019   Common bile duct dilation 09/01/2017   Lower back pain 09/01/2017   Vitamin D deficiency 02/03/2016   Hypothyroidism following radioiodine  therapy 01/06/2016   History of colonic polyps 11/06/2015   Thyroid-related proptosis 06/25/2015   History of biliary stent insertion 04/21/2015   AP (abdominal pain)    Compression fracture of L2 lumbar vertebra (HCC)    Abdominal pain 04/12/2015   TOBACCO ABUSE 08/21/2009   ATRIAL FIBRILLATION, HX OF 08/21/2009   Tobacco abuse 08/21/2009   PCP:  Derek Jack, MD Pharmacy:   [facility], [place] [address] [place] [address] Phone: [phone] Fax: [phone]  [place], [place] - [address] [place] [address] Phone: [phone] Fax: [phone]  [place] [address] Phone: [phone] Fax: [phone]     Social Determinants of Health (SDOH) Social History: [place]: No Food Insecurity (01/14/2022)  Housing: Low Risk  (01/14/2022)  Transportation Needs: No Transportation Needs (01/14/2022)  Utilities: Not At Risk (01/14/2022)  Alcohol Screen: Low Risk  (02/06/2020)  Depression (PHQ2-9): Low Risk  (12/28/2019)  Financial Resource Strain: Low Risk  (05/08/2019)  Physical Activity: Inactive (05/08/2019)  Social Connections: Moderately Isolated (02/06/2020)  Stress: No Stress Concern Present (05/08/2019)  Tobacco Use: Medium Risk (03/26/2022)   SDOH Interventions:     Readmission Risk Interventions     No data to display

## 2022-04-02 NOTE — Progress Notes (Signed)
Pharmacy Antibiotic Note  YAELIS SCHARFENBERG is a 82 y.o. female with concern for aspiration pneumonia.  Pharmacy has been consulted for unasyn dosing.  Plan: -Unasyn 3gm IV q8h -Will follow renal function, cultures and clinical progress    Height: 5' 2.01" (157.5 cm) Weight: 57.3 kg (126 lb 5.2 oz) IBW/kg (Calculated) : 50.12  Temp (24hrs), Avg:97.3 F (36.3 C), Min:96.5 F (35.8 C), Max:97.6 F (36.4 C)  Recent Labs  Lab 03/29/22 1140 03/26/2022 0352 04/02/2022 0426 03/31/22 0726 04/01/22 0135 04/02/22 0234  WBC 4.4 3.5*  --  3.6* 4.3 2.8*  CREATININE 0.85  --  0.92 0.97 0.86 0.92    Estimated Creatinine Clearance: 38.6 mL/min (by C-G formula based on SCr of 0.92 mg/dL).    No Known Allergies  Antimicrobials this admission: Cefepime 1/15>>1/18 Metronidazole 1/15>>1/18 Doxycycline PO x 1 on 1/18 Acyclovir PO ppx from PTA >> Levofloxacin IV 1/18 >> 1/25 Fluconazole > (2/14) Unasyn 126>>  Dose adjustments this admission:   Microbiology results: 1/15 BCx: neg 1/15 MRSA PCR: neg 1/18 Pleural fluid: neg  Thank you for allowing pharmacy to be a part of this patient's care.  Hildred Laser, PharmD Clinical Pharmacist **Pharmacist phone directory can now be found on [email or web address] ([email or web address]).  Listed under [place].

## 2022-04-02 NOTE — Progress Notes (Signed)
   NAME:  Courtney Arnold, MRN:  [id number], DOB:  January 26, 1941, LOS: 33 ADMISSION DATE:  03/28/2022, CONSULTATION DATE:  03/22/21 REFERRING MD:  Denton Brick  CHIEF COMPLAINT:  abd pain N and V while on chemo for refractory MM and now with R ML necrotizing pna   History of Present Illness:  82  yowf quit smoking 8 y PTA with  relapsed refractory MM and HBP ? Prior afib  admitted with 3 weeks of gen abd pain N and V with multifocal pna most dense in RML with cavity formation and PCCM service consulted pm 1/15   Pertinent  Medical History  82 y.o. female with multiple myeloma, atrial fibrillation, hypertension and history of cholecystectomy presenting for abdominal pain.  Abdominal pain started about 2 weeks PTA  States she is tender all over.  She has had 3 weeks of nausea and vomiting.  States her appetite has been really poor she is only drinking [facility] but cannot keep it down.  Denies fever or chills.  Denies diarrhea but still has been loose.  Denies hematemesis, hematochezia or melena and hematuria.  Daughter believes it to be related to new medication for her multiple myeloma which she started 6 months ago.  Medication is Pomalyst.  Daughter also mentioned that her skin appears more "yellow".  Taking weekly steroids.  Daughter stated   not currently being treated for atrial fibrillation.   Significant Hospital Events: Including procedures, antibiotic start and stop dates in addition to other pertinent events   MRSA  PCR  1/15 neg  BC x 2  1/15  03/09/2022 right thoracentesis 200cc; post-thora pneumothorax treated conservatively 1/19 smaller pneumo  Interim History / Subjective:  Afebrile overnight, denies complaints. Her daughter at bedside does not think she has significantly improved  Objective   Blood pressure (!) 85/49, pulse 73, temperature (!) 97.5 F (36.4 C), temperature source Axillary, resp. rate 18, height 5' 2.01" (1.575 m), weight 57.3 kg, SpO2 90 %.        Intake/Output  Summary (Last 24 hours) at 04/02/2022 1022 Last data filed at 04/02/2022 0438 Gross per 24 hour  Intake 525.53 ml  Output 1225 ml  Net -699.47 ml   Filed Weights   03/23/22 1225 03/09/2022 1356  Weight: 57.3 kg 57.3 kg    Examination: General: Frail elderly female HEENT: MM pink/moist no JVD Neuro: AA affect CV: Heart sounds are distant PULM: Currently on 4 L nasal cannula with O2 saturation is 99% GI: soft, bsx4 active   Extremities: warm/dry, negative edema  Skin: no rashes or lesions   Assessment & Plan:   Multifocal pna, at risk for OI due to immunosuppression from chemo for MM. More likely chronic aspiration given her frailty and esp with recent hx N/V. Rt iatrogenic pneumothorax post thora; improving with conservative management Called back 04/02/2022 for possible tune up thoracentesis.  Chest x-ray more consistent with airspace disease pneumonia small effusions.  Consider instituting antibacterial antibiotics No apparent role for thoracentesis at this time She appears to be end-stage in nature Her underlying multiple myeloma appears to be contributing to her decline   All other issues per primary   Richardson Landry Koby Hartfield ACNP Acute Care Nurse Practitioner [facility] Please consult [place] 04/02/2022, 10:27 AM

## 2022-04-02 NOTE — Progress Notes (Signed)
ANTICOAGULATION CONSULT NOTE\  Pharmacy Consult for Heparin Indication: atrial fibrillation  No Known Allergies  Patient Measurements: Height: 5' 2.01" (157.5 cm) Weight: 57.3 kg (126 lb 5.2 oz) IBW/kg (Calculated) : 50.12 Heparin Dosing Weight: 57 kg  Vital Signs: Temp: 97.5 F (36.4 C) (01/26 0747) Temp Source: Axillary (01/26 0747) BP: 85/49 (01/26 0747) Pulse Rate: 73 (01/26 0747)  Labs: Recent Labs    03/31/22 0726 04/01/22 0135 04/02/22 0234  HGB 8.3* 7.6* 7.2*  HCT 25.8* 24.3* 22.4*  PLT 106* 102* 79*  HEPARINUNFRC  --  0.56 0.56  CREATININE 0.97 0.86 0.92     Estimated Creatinine Clearance: 38.6 mL/min (by C-G formula based on SCr of 0.92 mg/dL).   Medical History: Past Medical History:  Diagnosis Date   Atrial fibrillation ([place]) 2011   Postop, spontaneous conversion to normal sinus after one hour   CAP (community acquired pneumonia) 09/23/2021   Cholecystitis, acute 04/25/2015   Compression fracture 07/24/2009   T12; kyphoplasty   History of echocardiogram 07/2009   EF 65%   Hypertension    Thyroid disease    Tobacco abuse     Medications:  Medications Prior to Admission  Medication Sig Dispense Refill Last Dose   acyclovir (ZOVIRAX) 400 MG tablet TAKE (1) TABLET BY MOUTH TWICE DAILY. (Patient taking differently: Take 400 mg by mouth 2 (two) times daily.) 60 tablet 6 03/21/2022   CALCIUM PO Take 1 capsule by mouth daily.   03/21/2022   carboxymethylcellulose (REFRESH PLUS) 0.5 % SOLN Place 1 drop into both eyes at bedtime.   03/21/2022   cephALEXin (KEFLEX) 500 MG capsule Take 1 capsule (500 mg total) by mouth 2 (two) times daily. 10 capsule 0 03/21/2022   Cholecalciferol (VITAMIN D) 125 MCG (5000 UT) CAPS Take 5,000 Units by mouth daily. 90 capsule 1 03/21/2022   Cyanocobalamin (B-12 PO) Take 1 capsule by mouth daily.   03/21/2022   dexamethasone (DECADRON) 4 MG tablet Take 2.5 tablets (10 mg total) by mouth 2 (two) times a week. 20 tablet 4 Past Week    levothyroxine (SYNTHROID) 137 MCG tablet Take 1 tablet (137 mcg total) by mouth daily before breakfast. 90 tablet 0 03/21/2022   methylphenidate (RITALIN) 5 MG tablet Take 10 mg by mouth daily. Only on days she is not taking dexamethasone tue,thu,fri,sat,sun   03/21/2022   montelukast (SINGULAIR) 10 MG tablet Take one tablet by mouth 1 hour prior to Darzalex injection appointments (Patient taking differently: Take one tablet by mouth 1 hour prior to Darzalex injection appointments - given every other Wednesday) 30 tablet 2 unk   Multiple Vitamin (MULTIVITAMIN WITH MINERALS) TABS tablet Take 1 tablet by mouth daily.   03/21/2022   pomalidomide (POMALYST) 3 MG capsule Take 1 capsule (3 mg total) by mouth daily. Take for 21 days, then hold for 7 days. Repeat every 28 days. 21 capsule 0 Past Month   potassium chloride (KLOR-CON M) 10 MEQ tablet Take 2 tablets (20 mEq total) by mouth daily. 60 tablet 3 03/21/2022   traMADol (ULTRAM) 50 MG tablet Take 1 tablet (50 mg total) by mouth every 12 (twelve) hours as needed. 30 tablet 0 03/26/2022    Assessment: 82 y.o. F on heparin->Lovenox for afib 1/17-1/23 at which time pt had hematemesis so reversed with protamine. Pt also with nosebleed 1/23 and GI felt that hematemesis was from nosebleed. No reoccurrence of bleeding since. Pharmacy consulted to dose heparin. -Hg 7.2 today (slow trend down) and plt 79 -heparin level= 0.56  on 1000 units/hr  Goal of Therapy:  Heparin level 0.3-0.5 units/ml (aim for lower goal with recent bleeding) Monitor platelets by anticoagulation protocol: Yes   Plan:  Decrease heparin to 950 units/hr Daily heparin level and CBC F/u closely for any bleeding  Hildred Laser, PharmD Clinical Pharmacist **Pharmacist phone directory can now be found on [email or web address] ([email or web address]).  Listed under [place].

## 2022-04-02 NOTE — Progress Notes (Signed)
Patient ID: Courtney Arnold, female   DOB: 11/19/1940, 82 y.o.   MRN: [id number]    Progress Note from the Palliative Medicine Team at [facility]   Patient Name: Courtney Arnold        Date: 04/02/2022 DOB: [birth year]  Age: 82 y.o. MRN#: [id number] Attending Physician: Kerney Elbe, DO Primary Care Physician: Derek Jack, MD Admit Date: 03/19/2022   Medical records reviewed, discussed with treatment team   82 y.o. female  with past medical history of multiple myeloma not in remission, active with Dr. Delton Coombes- was on decadron and polymyst which is now on hold due to her infections, significant pancytopenia with anemia and thrombocytopenia, COPD, A-fib postop with spontaneous conversion, HTN, thyroid disease, compression fracture T12 with kyphoplasty 2011, right femur IM nailing 2021, admitted on 03/13/2022 with lung abscess multi focal pneumonia with abscess formation.   Hospitalization  complicated by a-fib, thrush, epistaxis and hematemesis. EGD completed on 1/23 with findings of thrush, and duodenal mass Seen by ID- to complete antibiotic course on 1/25.     Today is day 11 of this hospital stay.  Further complications noted on chest x-ray today-significant for increased airspace opacity on the right mid lung and right lung apex concerning for superimposed pneumonia.  Ongoing small bilateral pleural effusions.  Increased interstitial in the left lung.  Patient and family face treatment option decisions, advanced directive decisions and anticipatory care needs.   This NP assessed patient at the bedside as a follow up for palliative medicine needs and emotional support and at the request of the attending over continued physical and functional decline secondary to new findings on chest x-ray today.   I met with patient and her 2 daughters at bedside.  Education offered on current medical situation and latest significant finding of large right sided pleural  effusion. Discussed possibility of thoracentesis.  Created space and opportunity for patient to explore thoughts and feelings regarding her current medical situation.  She clearly verbalizes she does not want to continue with life prolonging measures specific to thoracentesis, lab draws, IV medications and cardiac monitoring.  Education offered to patient and her daughters the difference between an aggressive medical intervention path versus a palliative comfort path for this patient, at this time in this situation.  Again patient clearly verbalizes her desire for a comfort path allowing for natural death.  Both daughters at bedside verbalize support of the patient's wish, "we want what  mom wants".  Plan of care---focus of care is comfort, quality and dignity allowing for natural death. -DNR/DNI -No further life-prolonging measures; specific to thoracentesis, no escalation of oxygen -No further diagnostics; lab draws, scans  -Minimize medications-no further IV antibiotics or albumin -Patient and family declined spiritual support -Symptom management      -Morphine IV 1 mg every 1 hour as needed for dyspnea/air hunger or pain--consider continuous drip if as needed medications are not efficacious      -Ativan IV 1 mg every 4 hours as needed for anxiety/agitation -Prognosis is likely hours to days-expect hospital death -PMT will continue to support holistically  Education offered on the natural trajectory and expectations at end-of-life.    Questions and concerns addressed.  Discussed with Dr Alfredia Ferguson and RN/Jen  Total time spent on the unit was 75 minutes-greater than 50% of the time was spent counseling and coordination of care   Wadie Lessen NP  Palliative Medicine Team Team Phone # [phone] Pager [phone]

## 2022-04-03 DIAGNOSIS — C9000 Multiple myeloma not having achieved remission: Secondary | ICD-10-CM | POA: Diagnosis not present

## 2022-04-03 DIAGNOSIS — J90 Pleural effusion, not elsewhere classified: Secondary | ICD-10-CM | POA: Diagnosis not present

## 2022-04-03 DIAGNOSIS — J851 Abscess of lung with pneumonia: Secondary | ICD-10-CM | POA: Diagnosis not present

## 2022-04-03 DIAGNOSIS — Z515 Encounter for palliative care: Secondary | ICD-10-CM | POA: Diagnosis not present

## 2022-04-03 DIAGNOSIS — J189 Pneumonia, unspecified organism: Secondary | ICD-10-CM | POA: Diagnosis not present

## 2022-04-03 LAB — TYPE AND SCREEN
ABO/RH(D): O POS
Antibody Screen: POSITIVE
Donor AG Type: NEGATIVE
Donor AG Type: NEGATIVE
Unit division: 0
Unit division: 0

## 2022-04-03 LAB — BPAM RBC
Blood Product Expiration Date: 202402172359
Blood Product Expiration Date: 202402182359
ISSUE DATE / TIME: 202401202128
ISSUE DATE / TIME: 202401202128
Unit Type and Rh: 5100
Unit Type and Rh: 5100

## 2022-04-03 NOTE — Progress Notes (Signed)
Daily Progress Note   Patient Name: Courtney Arnold       Date: 04/03/2022 DOB: 03/14/1940  Age: 82 y.o. MRN#: [id number] Attending Physician: Kerney Elbe, DO Primary Care Physician: Derek Jack, MD Admit Date: 03/21/2022  Reason for Consultation/Follow-up: Establishing goals of care  Patient Profile/HPI:  82 y.o. female  with past medical history of multiple myeloma not in remission, active with Dr. Delton Coombes- was on decadron and polymyst which is now on hold due to her infections, significant pancytopenia with anemia and thrombocytopenia, COPD, A-fib postop with spontaneous conversion, HTN, thyroid disease, compression fracture T12 with kyphoplasty 2011, right femur IM nailing 2021, admitted on 03/12/2022 with lung abscess multi focal pneumonia with abscess formation. Recovery has been complicated by a-fib, thrush, epistaxis and hematemesis. EGD completed on 1/23 with findings of thrush, and duodenal mass- awaiting biopsy. Seen by ID- to complete antibiotic course on 1/25.    Subjective: Chart reviewed including labs, progress notes, imaging from this and previous encounters.  Noted events of yesterday. Courtney Arnold is now comfort measures only.  Daughter is at bedside.  Courtney Arnold appears comfortable. Minimally interactive  Physical Exam Vitals and nursing note reviewed.  Constitutional:      Appearance: She is ill-appearing.     Comments: lethargic  HENT:     Nose:     Comments: Dried blood  Pulmonary:     Effort: Pulmonary effort is normal.  Skin:    Coloration: Skin is pale.             Vital Signs: BP (!) 92/41 (BP Location: Right Arm)   Pulse (!) 41   Temp (!) 96.7 F (35.9 C)   Resp 18   Ht 5' 2.01" (1.575 m)   Wt 57.3 kg   SpO2 (!) 86%   BMI 23.10 kg/m   SpO2: SpO2: (!) 86 % O2 Device: O2 Device: Nasal Cannula O2 Flow Rate: O2 Flow Rate (L/min): 4 L/min  Intake/output summary:  Intake/Output Summary (Last 24 hours) at 04/03/2022 1156 Last data filed at 04/03/2022 0100 Gross per 24 hour  Intake 182 ml  Output --  Net 182 ml    LBM: Last BM Date : 04/02/22 Baseline Weight: Weight: 57.3 kg Most recent weight: Weight: 57.3 kg       Palliative Assessment/Data: PPS: 10%  Patient Active Problem List   Diagnosis Date Noted   Postprocedural pneumothorax 03/27/2022   Pleural effusion 03/29/2022   Lung abscess ([place]) 03/14/2022   Multifocal pneumonia with Lung Abscess 03/24/2022   Paroxysmal atrial fibrillation with RVR ([place]) 03/23/2022   DNR (do not resuscitate)/DNI(Do Not Intubate) 01/13/2022   Epistaxis 09/27/2021   Acute blood loss anemia 09/27/2021   Thrombocytopenia ([place]) 09/24/2021   Symptomatic anemia 09/22/2021   AKI (acute kidney injury) ([place]) 09/22/2021   Pulmonary infiltrates 09/22/2021   Pancytopenia, acquired -Multiple Myeloma Related 09/17/2020   Multiple myeloma not having achieved remission ([place]) 06/07/2019   Goals of care, counseling/discussion 06/07/2019   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   Vitamin B 12 deficiency 05/22/2019   Chronic anxiety 05/22/2019   Closed fracture of right femur ([place]) 05/10/2019   Hypothyroidism 05/10/2019   Unspecified atrial fibrillation ([place]) 05/10/2019   Anxiety 05/10/2019   Anemia, unspecified 05/10/2019   Sacral lytic lesions 05/10/2019   Bone lesion 05/08/2019   Osteoarthritis of knee 04/06/2019   Common bile duct dilation 09/01/2017   Lower back pain 09/01/2017   Vitamin D deficiency 02/03/2016   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   History of colonic polyps 11/06/2015   Thyroid-related proptosis 06/25/2015   History of biliary stent insertion 04/21/2015   AP (abdominal pain)    Compression fracture of L2 lumbar vertebra (HCC)    Abdominal  pain 04/12/2015   TOBACCO ABUSE 08/21/2009   ATRIAL FIBRILLATION, HX OF 08/21/2009   Tobacco abuse 08/21/2009    Palliative Care Assessment & Plan    Assessment/Recommendations/Plan  Continue current comfort measures D/C telemetry, pulse ox, scheduled acetaminophen (not able to take PO) Vitals per family request   Code Status: DNR  Prognosis:  Hours - Days  Discharge Planning: Anticipated Hospital Death  Care plan was discussed with patient's daughters at the bedside.   Thank you for allowing the Palliative Medicine Team to assist in the care of this patient.  Greater than 50%  of this time was spent counseling and coordinating care related to the above assessment and plan.  Mariana Kaufman, AGNP-C Palliative Medicine   Please contact Palliative Medicine Team phone at [phone] for questions and concerns.

## 2022-04-03 NOTE — Progress Notes (Signed)
PROGRESS NOTE    Courtney Arnold  ZOX:[id number] DOB: 26-Nov-1940 DOA: 03/29/2022 PCP: Derek Jack, MD   Brief Narrative:  The patient is an 82 year old female with multiple myeloma, paroxysmal atrial fibrillation, COPD, pancytopenia required packed red blood cell transfusion as well as platelet transfusion, hypothyroidism as well as other comorbidities who presented to [facility] on 03/29/2022, vomiting and poor oral intake for last 3 weeks.  She underwent further imaging and CT chest abdomen pelvis revealed multifocal pneumonia and possible abscess in the right middle lobe with moderate right and small left pleural effusions.  She was transferred to [facility] on 03/23/2022 at the recommendation of pulmonary to obtain a pulmonary consult and she developed A-fib with RVR and started on amiodarone infusion.   On 03/24/2022 pulmonary recommended holding off bronchial containing antibiotics and switch IV amiodarone to oral   03/29/2022 she underwent a thoracentesis of the right pleural space with 200 cc of cloudy fluid obtained and she had a small iatrogenic pneumothorax.  ID was consulted she started on levofloxacin and there was a discussion with her oncologist as below   On 03/26/2022 she started having frequent small stools which continued and on 03/28/2022 heparin was resumed and Imodium frequency was increased.  On 03/08/2022 she had an episode of hematemesis and protamine given and anticoagulation was stopped.   On 03/28/2022 she is felt not to have capacity make her own medical decisions and goals of care discussion was held with her daughters and the attending physician at that time.  Attending physician Dr. Wynelle Cleveland felt that she had a very poor prognosis and was still not able to eat and having multiple loose stools along with her multiple myeloma, pancytopenia as well as new onset A-fib with RVR as well as pneumonia and lung abscess and transudative pleural effusion and slight  pneumothorax that she would have a poor outcome.  Family did not want to transition her to comfort care and wanted to treat the treatable.  Family felt that she could not go home.   After her episode of hematemesis she did have a nosebleed and GI scoped the patient and felt that the bleeding was from her nose given her history of epistaxis.  GI has since signed off the case but did find esophageal plaques suspicious for candidiasis and so she was started on fluconazole and the ID team increase this to 200 mg daily.     04/01/2022 she appears little volume overloaded so we will give her dose of IV Lasix just 20 mg and given a dose of IV albumin 25 g as well.  Diet will be advanced and she has no further epistaxis so we will consider changing her from IV heparin to an oral anticoagulant but will wait to see if she requires a repeat thoracentesis.  May need to reengage pulmonary given that her right pleural effusion is slightly worsening.  Palliative care continue to have goals of care discussion.   **04/02/22: Further decline and became more hypoxic so oxygen requirements increased.  Repeat chest x-ray showed worsening of her chest x-ray and she was possibly aspirating so Unasyn was started.  Pulmonary was reconsulted and they did a bedside ultrasound which showed a very large pleural effusion.  Palliative care was reengaged and given her clinical worsening and after further family discussion a decision was made to transition the patient to full comfort measures and all medications not in the patient's comfort have been discontinued and she has been started on  acetaminophen of 5 mg 3 times daily, albuterol nebs, bisacodyl suppositories, Ativan IV anxiety IV morphine 1 mg every hour for moderate pain and dyspnea, ondansetron as well as Liquifilm Tears.    Patient has a high risk for further clinical decompensation and worsening in she is not transition to residential hospice anticipating in-hospital death given  her extremely poor prognosis and multiple medical comorbidities. She appeared to be worsening so In-hospital death is expected.   Assessment and Plan:  Aspiration from vomiting at home but cannot rule out other infections as she is immune compromised -CT reveals bilateral multifocal nodules with consolidation in the right middle lobe, right moderate pleural effusion and small left pleural effusion-near complete resolution of previously noted left upper lobe consolidation  -ID has evaluated the patient and recommended oral levaquin and have now placed a stop date for this. Given ongoing loose stools they advise continuing through 1/25 and then trial off it -Continue with albuterol 2.5 mg nebs every 2 as needed for wheezing and shortness of breath, dextromethorphan-guaifenesin 1 tab p.o. twice daily, -Will add Flutter valve and incentive spirometry -Continue monitor respiratory status carefully and repeat chest x-ray in a.m. [name] was following and recommended antibiotics for 14 days as well as aspiration precautions and pulmonary hygiene -Pulmonary was reconsulted for repeat thoracentesis that she has a very large right-sided pleural effusion on ultrasound.  After further goals of care discussion with the patient and the family decision was made to transition the patient to full comfort measures and stopping all medications not in patient's comfort and focus on symptomatic management   Epistaxis causing Hematemesis, improving  -Has hx of nosebleeds and had one yesterday that was minor and resolved spontaneously.  Yesterday morning RN reported vomiting up blood after being given water. Has not had recurrence. Hgb 7.7 today from 8 yesterday. -GI evaluated and was n.p.o. yesterday for EGD and now on a clear liquid diet; EGD was done and there is no obvious source in the upper GI tract for bleeding or hematemesis and likely the source of bleeding from the posterior oropharynx and epistaxis -Now off  lovenox, protamine given -Will try to wean off o2 as likely contributes to nosebleed. If unable to do so will plan on humidified air. Will also continue Oxymetazoline 1 spray each nare BID  -Hgb/Hct Trend: Recent Labs  Lab 03/28/22 1257 03/29/22 1140 03/16/2022 0352 03/27/2022 1411 03/31/22 0726 04/01/22 0135 04/02/22 0234  HGB 8.1* 8.0* 7.7* 7.7* 8.3* 7.6* 7.2*  HCT 25.1* 24.9* 23.9* 25.6* 25.8* 24.3* 22.4*  MCV 100.0 100.4* 103.5*  --  101.2* 103.8* 101.8*  -Was on PPI gtt but will go to q12h -FOBT was Positive  -If she continues to currently have epistaxis we will consult ENT for further evaluation and recommendations however she is being transitioned to full comfort care now   Duodenal Mass -Large polypoid mass with no bleeding was found in the second portion of the duodenum and biopsies were taken with cold forceps for histology and these are currently pending   Iatrogenic right pneumothorax Acute Hypoxic respiratory failure -Post thoracentesis - improving but Re-occurring Pleural Effusion so will discuss with Pulmonary   -SpO2: (!) 86 % O2 Flow Rate (L/min): 4 L/min FiO2 (%): 50 % -Levofloxacin for antibiotic coverage has been discontinued however given that there is concern that she was aspirating SLP was ordered and she was placed back on IV Unasyn. -Continue with albuterol 2.5 mg nebs every 2 as needed for wheezing and shortness of  breath, dextromethorphan-guaifenesin 1 tab p.o. twice daily, -Pulmonary consulted for further evaluation and they did a bedside ultrasound which showed a very large right pleural effusion.  After further goals of care discussion family does not want a repeat thoracentesis and she is being transitioned to comfort care.   Hypotension, blood pressure remains on the lower side -BP was on the softer side -She remains volume overloaded and hesitant to give more fluid.  Dose of IV albumin was ordered however she did not IV access. -Continue to Monitor and  Trend BP's per Protocol -Last BP was 88/41 and on the softer side -Will order TED hose and IV Albumin 25 g x1 -Subsequently she is being transitioned to full comfort care measures   Nausea/ vomiting with poor oral intake -She has had vomiting for few weeks and continued to do so in the hospital -Abdomen distended on exam but has been non tender  -CT scan on 1/16 which revealed a surgically absent gallbladder, CBD of 8 mm, small hiatal hernia with circumferential esophageal wall thickening suggestive of GERD or less likely esophageal neoplasm -The patient takes Decadron twice a week as outpt -Per family and RN, she vomits/regurgitates just within a few minutes of eating-  -SLP eval done- no signs of aspiration -No thrush noted on exam but noted to have Candidiasis on EGD so now on Fluconazole  -Will discontinue all medications not in the patient's comfort given that she is being transitioned to comfort care continue symptomatic management   Loose stool -Possibly from antibiotics- abdomen is still non tender so less likely it is infectious. This morning loose stool but not watery -C/w Imodium PRN   Multiple myeloma, relapsed Fatigue related to cancer -Receiving pomalidomide as outpatient with 1/16 to have been the date of her next cycle -Has lesions throughout her skeleton -Receives acyclovir prophylaxis twice a day, Decadron and Ritalin -Her oncologist has recommended to hold Pomalidomide & Decadron and follow-up with him as outpatient to decide on whether or not to resume these -Being transitioned to [place]    Paroxysmal atrial fibrillation with RVR -new diagnosis Pleural effusions; Right > Left  Mod pulm HTN- possibility of diastolic dysfunction -[id number] score is 4 -Transthoracic ECHO> EF 55-60%, unable to evaluate diastolic function, b/l atrial dilation, mild to mod TR, mo elevated pulm pressure, normal IV function with mild RV enlargement -Placed on IV amiodarone and  transitioned to oral amiodarone   -Given a dose of IV Lasix 20 mg x 1 -Plan was for 400 mg twice daily x 10 days (stop date 1/25), then 200 mg daily and then outpatient follow-up but will stop given transitioning to [place]  -Anticoagulation resumed but will discontinue that she is [place] CXR done showed "Increased airspace opacity in the right mid lung and right lung apex concerning for superimposed  pneumonia or asymmetric edema. Small bilateral pleural effusions. Increased interstitial accentuation in the left lung, potentially from interstitial edema or atypical pneumonia. Old left rib fractures. Thoracic spondylosis with remote lower thoracic compression fractures and multiple vertebral augmentations."   Acute Urinary Retention -RN says foley was placed for retention and Foley catheter was initially discontinued however she continues to retain so Foley catheter will be reinserted and will be maintained for comfort    Right Lower Extremity Swelling -Per daughter this is new. U/s 1/22 shows no signs DVT   Left Upper Extremity Swelling -U/S ordered was completed today and showed no evidence of the deep vein thrombosis in the  upper extremites but findings are consistent with an acute superficial vein thrombosis involving the left cephalic vein -Continue with elevation -Back on Anticoagulation but now will discontinue that patient is now [place], family interested in that. TOC consulted for assistance with placement but now has a high chance for In Hospital death given that she has been transitioned to [place]   Hypothyroidism -TSH was 5.614 -Likely sick euthyroid -Discontinue Levothyroxine 137 mcg po Daily given that she is now comfort care all   Esophageal Candidiasis -Noted on EGD -GI recommending Fluconazole 100 mg po Daily x3 Weeks but this was increased to 200 mg po Daily; However after being transitioned to [place] her  Fluconazole is stopped  -Will not repeat labs in the morning given that she is now fully comfort care   Metabolic Acidosis -Patient's CO2 is now 20, AG is 11, and Chloride Level is now 107 on last check  -Will not repeat labs in the morning given that she is now fully comfort care   Hypophosphatemia -Phos Level Trend: Recent Labs  Lab 03/23/22 0500 04/01/22 0135 04/02/22 0234  PHOS 2.8 1.5* 2.5  -Replete with po K Phos Neutral 500 mg po BID x 2  -Continue to Monitor and Replete as Necessary -Will not repeat labs in the morning given that she is now fully comfort care   Pancytopenia in the Setting of Multiple Myeloma -CBC Trend: Recent Labs  Lab 03/27/22 0117 03/28/22 1257 03/29/22 1140 03/19/2022 0352 03/14/2022 1411 03/31/22 0726 04/01/22 0135 04/02/22 0234  WBC 5.2 4.7 4.4 3.5*  --  3.6* 4.3 2.8*  HGB 7.7* 8.1* 8.0* 7.7*   < > 8.3* 7.6* 7.2*  HCT 23.5* 25.1* 24.9* 23.9*   < > 25.8* 24.3* 22.4*  MCV 98.3 100.0 100.4* 103.5*  --  101.2* 103.8* 101.8*  PLT 100* 102* 92* 82*  --  106* 102* 79*   < > = values in this interval not displayed.  -Continue to Monitor for S/Sx of Bleeding; No overt bleeding noted now -Will not repeat labs in the morning given that she is now fully comfort care  DVT prophylaxis: None now that patient is Comfort Care    Code Status: DNR Family Communication: No family present at bedside   Disposition Plan:  Level of care: Palliative Care Status is: Inpatient Remains inpatient appropriate because: Has been transitioned to [place] and expect prognosis to be hours to days with anticipated Hospital Death   Consultants:  Gastroenterology Infectious Diseases Palliative Care Medicine Cardiology  PCCM/Pulmonary   Procedures:  EGD Findings:      Post nasal drainage and blood was seen in the posterior oropharynx.      Patchy, yellow plaques were found in the entire esophagus. Biopsies were       taken with a cold forceps for histology.       The entire examined stomach was normal.      The cardia and gastric fundus were normal on retroflexion.      A large polypoid mass with no bleeding was found in the second portion       of the duodenum. Biopsies were taken with a cold forceps for histology. Impression:               - Post nasal drainage is seen in the oropharynx.                           -  Esophageal plaques were found, suspicious for                            candidiasis. Biopsied.                           - Normal stomach.                           - Rule out malignancy, duodenal mass. Biopsied.                           No obviouse source in the upper GI tract for                            bleed/hematemeis, likely source is bleeding from                            posterior oropharynx/epistaxis Recommendation:           - Resume previous diet.                           - Continue present medications.                           - Await pathology results.                           - Diflucan (fluconazole) 100 mg PO daily for 3                            weeks.                           - Please consult ENT for epistaxis                           - Ok to restart heparin from GI standpoint   Thoracentesis   Antimicrobials:  Anti-infectives (From admission, onward)    Start     Dose/Rate Route Frequency Ordered Stop   04/02/22 1230  Ampicillin-Sulbactam (UNASYN) 3 g in sodium chloride 0.9 % 100 mL IVPB  Status:  Discontinued        3 g 200 mL/hr over 30 Minutes Intravenous Every 8 hours 04/02/22 1130 04/02/22 1312   04/02/22 1000  levofloxacin (LEVAQUIN) tablet 250 mg  Status:  Discontinued       See Hyperspace for full Linked Orders Report.   250 mg Oral Daily 03/26/22 1624 03/31/22 1014   04/01/22 1000  fluconazole (DIFLUCAN) tablet 200 mg  Status:  Discontinued        200 mg Oral Daily 03/31/22 1606 04/02/22 1317   03/31/22 1000  fluconazole (DIFLUCAN) tablet 100 mg  Status:  Discontinued        100 mg Oral  Daily 03/31/22 0843 03/31/22 1606   03/27/22 2200  levofloxacin (LEVAQUIN) IVPB 750 mg  Status:  Discontinued        750 mg 100 mL/hr over 90 Minutes Intravenous Every 48 hours 03/26/22 0919 03/26/22 1624   03/27/22  2200  levofloxacin (LEVAQUIN) IVPB 750 mg       See Hyperspace for full Linked Orders Report.   750 mg 100 mL/hr over 90 Minutes Intravenous Every 48 hours 03/26/22 1624 04/01/22 0006   03/26/22 1215  sulfamethoxazole-trimethoprim (BACTRIM DS) 800-160 MG per tablet 1 tablet  Status:  Discontinued        1 tablet Oral Once per day on Mon Wed Fri 03/26/22 1125 03/31/2022 0749   04/06/2022 2200  levofloxacin (LEVAQUIN) tablet 750 mg  Status:  Discontinued        750 mg Oral Every 48 hours 03/29/2022 1312 03/26/22 0919   03/12/2022 1215  doxycycline (VIBRA-TABS) tablet 100 mg  Status:  Discontinued        100 mg Oral Every 12 hours 04/06/2022 1128 04/03/2022 1312   03/23/22 1300  vancomycin (VANCOREADY) IVPB 750 mg/150 mL  Status:  Discontinued        750 mg 150 mL/hr over 60 Minutes Intravenous Every 24 hours 03/29/2022 1212 03/23/22 0902   03/23/22 1300  vancomycin (VANCOREADY) IVPB 750 mg/150 mL        750 mg 150 mL/hr over 60 Minutes Intravenous Every 24 hours 03/23/22 0902 03/23/22 1442   03/16/2022 2200  piperacillin-tazobactam (ZOSYN) IVPB 3.375 g  Status:  Discontinued        3.375 g 12.5 mL/hr over 240 Minutes Intravenous Every 8 hours 04/01/2022 1447 03/15/2022 1652   03/09/2022 2200  ceFEPIme (MAXIPIME) 2 g in sodium chloride 0.9 % 100 mL IVPB  Status:  Discontinued        2 g 200 mL/hr over 30 Minutes Intravenous Every 12 hours 03/08/2022 1652 03/16/2022 1312   03/29/2022 2200  metroNIDAZOLE (FLAGYL) IVPB 500 mg  Status:  Discontinued        500 mg 100 mL/hr over 60 Minutes Intravenous Every 12 hours 03/28/2022 1652 04/01/2022 1128   03/08/2022 2200  acyclovir (ZOVIRAX) tablet 400 mg  Status:  Discontinued        400 mg Oral 2 times daily 03/27/2022 1807 04/02/22 1312   03/08/2022 1430   piperacillin-tazobactam (ZOSYN) IVPB 3.375 g        3.375 g 100 mL/hr over 30 Minutes Intravenous  Once 04/03/2022 1430 03/19/2022 1953   03/21/2022 1145  vancomycin (VANCOREADY) IVPB 1250 mg/250 mL        1,250 mg 166.7 mL/hr over 90 Minutes Intravenous  Once 04/04/2022 1130 03/09/2022 1559   03/18/2022 1130  ceFEPIme (MAXIPIME) 1 g in sodium chloride 0.9 % 100 mL IVPB  Status:  Discontinued        1 g 200 mL/hr over 30 Minutes Intravenous  Once 03/16/2022 1118 03/21/2022 1129   03/27/2022 1130  ceFEPIme (MAXIPIME) 2 g in sodium chloride 0.9 % 100 mL IVPB        2 g 200 mL/hr over 30 Minutes Intravenous  Once 03/18/2022 1129 03/29/2022 1330   03/24/2022 1115  cefTRIAXone (ROCEPHIN) 1 g in sodium chloride 0.9 % 100 mL IVPB  Status:  Discontinued        1 g 200 mL/hr over 30 Minutes Intravenous  Once 03/24/2022 1114 03/18/2022 1117   03/19/2022 1115  azithromycin (ZITHROMAX) 500 mg in sodium chloride 0.9 % 250 mL IVPB  Status:  Discontinued        500 mg 250 mL/hr over 60 Minutes Intravenous  Once 04/06/2022 1114 03/29/2022 1117       Subjective: Seen and Examined at bedside she was a little withdrawn and  resting.  She was asleep and woke up from sleep.  Appeared calm and did appear comfortable.  Had no complaints at this time but asked for oatmeal earlier.  No other concerns or complaints this time and appeared a little worse than yesterday.  Objective: Vitals:   04/02/22 1333 04/02/22 1541 04/02/22 2109 04/03/22 1118  BP:  (!) 95/42 (!) 104/44 (!) 92/41  Pulse:  72 77 (!) 41  Resp: (!) 35 '20 19 18  '$ Temp:  (!) 97.2 F (36.2 C) (!) 96.8 F (36 C) (!) 96.7 F (35.9 C)  TempSrc:  Axillary Axillary Axillary  SpO2:  100% 98% (!) 86%  Weight:      Height:        Intake/Output Summary (Last 24 hours) at 04/03/2022 1304 Last data filed at 04/03/2022 0100 Gross per 24 hour  Intake 182 ml  Output --  Net 182 ml   Filed Weights   03/23/22 1225 03/16/2022 1356  Weight: 57.3 kg 57.3 kg   Examination: Physical  Exam:  Constitutional: Elderly chronically ill-appearing Caucasian female who was resting and appeared a little worse compared to yesterday Respiratory: Diminished to auscultation bilaterally with some coarse breath sounds and some crackles and some rhonchi.  No appreciable wheezing or rales. Normal respiratory effort and patient is not tachypenic. No accessory muscle use.  Wearing supplemental oxygen via nasal cannula Cardiovascular: RRR, no murmurs / rubs / gallops. S1 and S2 auscultated. No extremity edema.  Abdomen: Soft, non-tender, non-distended.  Bowel sounds positive.  GU: Deferred. Musculoskeletal: No clubbing / cyanosis of digits/nails. No joint deformity upper and lower extremities. Skin: No rashes, lesions, ulcers on limited skin evaluation. No induration; Warm and dry.  Neurologic: Little somnolent and drowsy and had to be awoken from sleep but when she is awake cranial nerves II through XII are grossly intact Psychiatric: Normal judgment and insight.  Somnolent and drowsy but once she is fully awake she is awake and alert and oriented x 2  Data Reviewed: I have personally reviewed following labs and imaging studies  CBC: Recent Labs  Lab 03/29/22 1140 04/07/2022 0352 03/12/2022 1411 03/31/22 0726 04/01/22 0135 04/02/22 0234  WBC 4.4 3.5*  --  3.6* 4.3 2.8*  NEUTROABS  --   --   --   --  3.5 2.3  HGB 8.0* 7.7* 7.7* 8.3* 7.6* 7.2*  HCT 24.9* 23.9* 25.6* 25.8* 24.3* 22.4*  MCV 100.4* 103.5*  --  101.2* 103.8* 101.8*  PLT 92* 82*  --  106* 102* 79*   Basic Metabolic Panel: Recent Labs  Lab 03/29/22 1140 03/12/2022 0426 03/31/22 0726 04/01/22 0135 04/02/22 0234  NA 137 137 138 138 138  K 4.4 4.3 4.3 4.1 4.1  CL 110 110 109 108 107  CO2 20* 20* 21* 20* 20*  GLUCOSE 137* 97 125* 129* 93  BUN '13 13 13 14 16  '$ CREATININE 0.85 0.92 0.97 0.86 0.92  CALCIUM 6.9* 6.9* 6.7* 6.5* 6.2*  MG  --   --   --  1.8 2.3  PHOS  --   --   --  1.5* 2.5   GFR: Estimated Creatinine  Clearance: 38.6 mL/min (by C-G formula based on SCr of 0.92 mg/dL). Liver Function Tests: Recent Labs  Lab 04/01/22 0135 04/02/22 0234  AST 11* 15  ALT 12 11  ALKPHOS 43 40  BILITOT 0.5 1.0  PROT 5.2* 4.9*  ALBUMIN 2.5* 2.5*   No results for input(s): "LIPASE", "AMYLASE" in the last 168 hours.  No results for input(s): "AMMONIA" in the last 168 hours. Coagulation Profile: No results for input(s): "INR", "PROTIME" in the last 168 hours. Cardiac Enzymes: No results for input(s): "CKTOTAL", "CKMB", "CKMBINDEX", "TROPONINI" in the last 168 hours. BNP (last 3 results) No results for input(s): "PROBNP" in the last 8760 hours. HbA1C: No results for input(s): "HGBA1C" in the last 72 hours. CBG: No results for input(s): "GLUCAP" in the last 168 hours. Lipid Profile: No results for input(s): "CHOL", "HDL", "LDLCALC", "TRIG", "CHOLHDL", "LDLDIRECT" in the last 72 hours. Thyroid Function Tests: No results for input(s): "TSH", "T4TOTAL", "FREET4", "T3FREE", "THYROIDAB" in the last 72 hours. Anemia Panel: No results for input(s): "VITAMINB12", "FOLATE", "FERRITIN", "TIBC", "IRON", "RETICCTPCT" in the last 72 hours. Sepsis Labs: No results for input(s): "PROCALCITON", "LATICACIDVEN" in the last 168 hours.  Recent Results (from the past 240 hour(s))  Body fluid culture w Gram Stain     Status: None   Collection Time: 03/18/2022  3:07 PM   Specimen: Thoracentesis; Body Fluid  Result Value Ref Range Status   Specimen Description THORACENTESIS  Final   Special Requests NONE  Final   Gram Stain   Final    WBC PRESENT,BOTH PMN AND MONONUCLEAR NO ORGANISMS SEEN CYTOSPIN SMEAR    Culture   Final    NO GROWTH 3 DAYS Performed at [facility], [address], [place], [place] [address]    Report Status 03/29/2022 FINAL  Final    Radiology Studies: DG CHEST PORT 1 VIEW  Result Date: 04/02/2022 CLINICAL DATA:  Shortness of breath EXAM: PORTABLE CHEST 1 VIEW COMPARISON:  04/01/2022  FINDINGS: Blunting of both costophrenic angles, right greater than left, compatible with pleural effusions. Increased airspace opacity in the right mid lung and right lung apex concerning for superimposed pneumonia or asymmetric edema. Increased interstitial accentuation in the left lung. Left rib deformities compatible with old fractures. Thoracic spondylosis with remote lower thoracic compression fractures and multiple vertebral augmentations. Atherosclerotic calcification of the aortic arch. IMPRESSION: 1. Increased airspace opacity in the right mid lung and right lung apex concerning for superimposed pneumonia or asymmetric edema. 2. Small bilateral pleural effusions. 3. Increased interstitial accentuation in the left lung, potentially from interstitial edema or atypical pneumonia. 4. Old left rib fractures. Thoracic spondylosis with remote lower thoracic compression fractures and multiple vertebral augmentations. Electronically Signed   By: Van Clines M.D.   On: 04/02/2022 08:25    Scheduled Meds:  Chlorhexidine Gluconate Cloth  6 each Topical Daily   pantoprazole  40 mg Intravenous Q12H   sodium chloride flush  3 mL Intravenous Q12H   sodium chloride flush  3 mL Intravenous Q12H   Continuous Infusions:  sodium chloride 10 mL/hr at 03/31/22 0513    LOS: 12 days   Raiford Noble, DO [facility] Available via Epic secure chat 7am-7pm After these hours, please refer to coverage provider listed on [email or web address] 04/03/2022, 1:04 PM

## 2022-04-03 NOTE — Plan of Care (Signed)

## 2022-04-03 NOTE — Progress Notes (Signed)
Chart reviewed.  Noted transition to full comfort measures.  PCCM available as needed.         Kennieth Rad, [facility] 04/03/2022, 2:40 PM  See Amion for pager If no response to pager, please call PCCM consult pager After 7:00 pm call Elink

## 2022-04-04 DIAGNOSIS — J189 Pneumonia, unspecified organism: Secondary | ICD-10-CM | POA: Diagnosis not present

## 2022-04-04 DIAGNOSIS — J90 Pleural effusion, not elsewhere classified: Secondary | ICD-10-CM | POA: Diagnosis not present

## 2022-04-04 DIAGNOSIS — Z515 Encounter for palliative care: Secondary | ICD-10-CM | POA: Diagnosis not present

## 2022-04-04 DIAGNOSIS — C9000 Multiple myeloma not having achieved remission: Secondary | ICD-10-CM | POA: Diagnosis not present

## 2022-04-04 NOTE — Progress Notes (Signed)
PROGRESS NOTE    Courtney Arnold  BSJ:[id number] DOB: April 27, 1940 DOA: 03/19/2022 PCP: Derek Jack, MD   Brief Narrative:  The patient is an 82 year old female with multiple myeloma, paroxysmal atrial fibrillation, COPD, pancytopenia required packed red blood cell transfusion as well as platelet transfusion, hypothyroidism as well as other comorbidities who presented to [facility] on 03/21/2022, vomiting and poor oral intake for last 3 weeks.  She underwent further imaging and CT chest abdomen pelvis revealed multifocal pneumonia and possible abscess in the right middle lobe with moderate right and small left pleural effusions.  She was transferred to [facility] on 03/23/2022 at the recommendation of pulmonary to obtain a pulmonary consult and she developed A-fib with RVR and started on amiodarone infusion.   On 03/24/2022 pulmonary recommended holding off bronchial containing antibiotics and switch IV amiodarone to oral   03/08/2022 she underwent a thoracentesis of the right pleural space with 200 cc of cloudy fluid obtained and she had a small iatrogenic pneumothorax.  ID was consulted she started on levofloxacin and there was a discussion with her oncologist as below   On 03/26/2022 she started having frequent small stools which continued and on 03/28/2022 heparin was resumed and Imodium frequency was increased.  On 03/10/2022 she had an episode of hematemesis and protamine given and anticoagulation was stopped.   On 03/28/2022 she is felt not to have capacity make her own medical decisions and goals of care discussion was held with her daughters and the attending physician at that time.  Attending physician Dr. Wynelle Cleveland felt that she had a very poor prognosis and was still not able to eat and having multiple loose stools along with her multiple myeloma, pancytopenia as well as new onset A-fib with RVR as well as pneumonia and lung abscess and transudative pleural effusion and slight  pneumothorax that she would have a poor outcome.  Family did not want to transition her to comfort care and wanted to treat the treatable.  Family felt that she could not go home.   After her episode of hematemesis she did have a nosebleed and GI scoped the patient and felt that the bleeding was from her nose given her history of epistaxis.  GI has since signed off the case but did find esophageal plaques suspicious for candidiasis and so she was started on fluconazole and the ID team increase this to 200 mg daily.     04/01/2022 she appears little volume overloaded so we will give her dose of IV Lasix just 20 mg and given a dose of IV albumin 25 g as well.  Diet will be advanced and she has no further epistaxis so we will consider changing her from IV heparin to an oral anticoagulant but will wait to see if she requires a repeat thoracentesis.  May need to reengage pulmonary given that her right pleural effusion is slightly worsening.  Palliative care continue to have goals of care discussion.   **04/02/22: Further decline and became more hypoxic so oxygen requirements increased.  Repeat chest x-ray showed worsening of her chest x-ray and she was possibly aspirating so Unasyn was started.  Pulmonary was reconsulted and they did a bedside ultrasound which showed a very large pleural effusion.  Palliative care was reengaged and given her clinical worsening and after further family discussion a decision was made to transition the patient to full comfort measures and all medications not in the patient's comfort have been discontinued and she has been started on  acetaminophen of 5 mg 3 times daily, albuterol nebs, bisacodyl suppositories, Ativan IV anxiety IV morphine 1 mg every hour for moderate pain and dyspnea, ondansetron as well as Liquifilm Tears.     Patient has a high risk for further clinical decompensation and worsening in she is not transition to residential hospice anticipating in-hospital death given  her extremely poor prognosis and multiple medical comorbidities. She appeared to be worsening so In-hospital death is expected.    Assessment and Plan:  Aspiration from vomiting at home but cannot rule out other infections as she is immune compromised -CT reveals bilateral multifocal nodules with consolidation in the right middle lobe, right moderate pleural effusion and small left pleural effusion-near complete resolution of previously noted left upper lobe consolidation  -ID has evaluated the patient and recommended oral levaquin and have now placed a stop date for this. Given ongoing loose stools they advise continuing through 1/25 and then trial off it -Continue with albuterol 2.5 mg nebs every 2 as needed for wheezing and shortness of breath, dextromethorphan-guaifenesin 1 tab p.o. twice daily, -Will add Flutter valve and incentive spirometry -Continue monitor respiratory status carefully and repeat chest x-ray in a.m. [name] was following and recommended antibiotics for 14 days as well as aspiration precautions and pulmonary hygiene -Pulmonary was reconsulted for repeat thoracentesis that she has a very large right-sided pleural effusion on ultrasound.  After further goals of care discussion with the patient and the family decision was made to transition the patient to full comfort measures and stopping all medications not in patient's comfort and focus on symptomatic management   Epistaxis causing Hematemesis, improving  -Has hx of nosebleeds and had one yesterday that was minor and resolved spontaneously.  Yesterday morning RN reported vomiting up blood after being given water. Has not had recurrence. Hgb 7.7 today from 8 yesterday. -GI evaluated and was n.p.o. yesterday for EGD and now on a clear liquid diet; EGD was done and there is no obvious source in the upper GI tract for bleeding or hematemesis and likely the source of bleeding from the posterior oropharynx and epistaxis -Now off  lovenox, protamine given -Will try to wean off o2 as likely contributes to nosebleed. If unable to do so will plan on humidified air. Will also continue Oxymetazoline 1 spray each nare BID  -Hgb/Hct Trend: Recent Labs  Lab 03/28/22 1257 03/29/22 1140 03/27/2022 0352 03/31/2022 1411 03/31/22 0726 04/01/22 0135 04/02/22 0234  HGB 8.1* 8.0* 7.7* 7.7* 8.3* 7.6* 7.2*  HCT 25.1* 24.9* 23.9* 25.6* 25.8* 24.3* 22.4*  MCV 100.0 100.4* 103.5*  --  101.2* 103.8* 101.8*  -Was on PPI gtt but will go to q12h -FOBT was Positive  -If she continues to currently have epistaxis we will consult ENT for further evaluation and recommendations however she is being transitioned to full comfort care now   Duodenal Mass -Large polypoid mass with no bleeding was found in the second portion of the duodenum and biopsies were taken with cold forceps for histology and these are currently pending   Iatrogenic right pneumothorax Acute Hypoxic respiratory failure -Post thoracentesis - improving but Re-occurring Pleural Effusion so will discuss with Pulmonary   -SpO2: (!) 86 % O2 Flow Rate (L/min): 4 L/min FiO2 (%): 50 % -Levofloxacin for antibiotic coverage has been discontinued however given that there is concern that she was aspirating SLP was ordered and she was placed back on IV Unasyn. -Continue with albuterol 2.5 mg nebs every 2 as needed for wheezing and  shortness of breath, dextromethorphan-guaifenesin 1 tab p.o. twice daily, -Pulmonary consulted for further evaluation and they did a bedside ultrasound which showed a very large right pleural effusion.  After further goals of care discussion family does not want a repeat thoracentesis and she is being transitioned to comfort care.   Hypotension, blood pressure remains on the lower side -BP was on the softer side -She remains volume overloaded and hesitant to give more fluid.  Dose of IV albumin was ordered however she did not IV access. -Continue to Monitor and  Trend BP's per Protocol -Last BP was 92/41 and on the softer side -Will order TED hose and IV Albumin 25 g x1 -Subsequently she is being transitioned to full comfort care measures   Nausea/ vomiting with poor oral intake -She has had vomiting for few weeks and continued to do so in the hospital -Abdomen distended on exam but has been non tender  -CT scan on 1/16 which revealed a surgically absent gallbladder, CBD of 8 mm, small hiatal hernia with circumferential esophageal wall thickening suggestive of GERD or less likely esophageal neoplasm -The patient takes Decadron twice a week as outpt -Per family and RN, she vomits/regurgitates just within a few minutes of eating-  -SLP eval done- no signs of aspiration -No thrush noted on exam but noted to have Candidiasis on EGD so now on Fluconazole  -Will discontinue all medications not in the patient's comfort given that she is being transitioned to comfort care continue symptomatic management   Loose stool -Possibly from antibiotics- abdomen is still non tender so less likely it is infectious. This morning loose stool but not watery -C/w Imodium PRN   Multiple myeloma, relapsed Fatigue related to cancer -Receiving pomalidomide as outpatient with 1/16 to have been the date of her next cycle -Has lesions throughout her skeleton -Receives acyclovir prophylaxis twice a day, Decadron and Ritalin -Her oncologist has recommended to hold Pomalidomide & Decadron and follow-up with him as outpatient to decide on whether or not to resume these -Being transitioned to [place] now and all medications and interventions stopped that are not in line with the Comfort Focus   Paroxysmal atrial fibrillation with RVR -new diagnosis Pleural effusions; Right > Left  Mod pulm HTN- possibility of diastolic dysfunction -[id number] score is 4 -Transthoracic ECHO> EF 55-60%, unable to evaluate diastolic function, b/l atrial dilation, mild to mod TR, mo elevated  pulm pressure, normal IV function with mild RV enlargement -Placed on IV amiodarone and transitioned to oral amiodarone   -Given a dose of IV Lasix 20 mg x 1 -Plan was for 400 mg twice daily x 10 days (stop date 1/25), then 200 mg daily and then outpatient follow-up but will stop given transitioning to [place]  -Anticoagulation resumed but will discontinue that she is [place] CXR done showed "Increased airspace opacity in the right mid lung and right lung apex concerning for superimposed  pneumonia or asymmetric edema. Small bilateral pleural effusions. Increased interstitial accentuation in the left lung, potentially from interstitial edema or atypical pneumonia. Old left rib fractures. Thoracic spondylosis with remote lower thoracic compression fractures and multiple vertebral augmentations."   Acute Urinary Retention -RN says foley was placed for retention and Foley catheter was initially discontinued however she continues to retain so Foley catheter will be reinserted and will be maintained for comfort    Right Lower Extremity Swelling -Per daughter this is new. U/s 1/22 shows no signs DVT   Left Upper Extremity  Swelling -U/S ordered was completed today and showed no evidence of the deep vein thrombosis in the upper extremites but findings are consistent with an acute superficial vein thrombosis involving the left cephalic vein -Continue with elevation -Back on Anticoagulation but now will discontinue that patient is now [place] and Physical Deconditioning  -PT advising SNF, family interested in that. TOC consulted for assistance with placement but now has a high chance for In Hospital death given that she has been transitioned to [place] and may consider [place] as opposed to SNF   Hypothyroidism -TSH was 5.614 -Likely sick euthyroid -Discontinue Levothyroxine 137 mcg po Daily given that she is now comfort care all   Esophageal  Candidiasis -Noted on EGD -GI recommending Fluconazole 100 mg po Daily x3 Weeks but this was increased to 200 mg po Daily; However after being transitioned to [place] her Fluconazole is stopped  -Will not repeat labs in the morning given that she is now fully comfort care   Metabolic Acidosis -Patient's CO2 is now 20, AG is 11, and Chloride Level is now 107 on last check  -Will not repeat labs in the morning given that she is now fully comfort care   Hypophosphatemia -Phos Level Trend: Recent Labs  Lab 03/23/22 0500 04/01/22 0135 04/02/22 0234  PHOS 2.8 1.5* 2.5  -Replete with po K Phos Neutral 500 mg po BID x 2  -Continue to Monitor and Replete as Necessary -Will not repeat labs in the morning given that she is now fully comfort care   Pancytopenia in the Setting of Multiple Myeloma -CBC Trend: Recent Labs  Lab 03/27/22 0117 03/28/22 1257 03/29/22 1140 04/02/2022 0352 04/07/2022 1411 03/31/22 0726 04/01/22 0135 04/02/22 0234  WBC 5.2 4.7 4.4 3.5*  --  3.6* 4.3 2.8*  HGB 7.7* 8.1* 8.0* 7.7*   < > 8.3* 7.6* 7.2*  HCT 23.5* 25.1* 24.9* 23.9*   < > 25.8* 24.3* 22.4*  MCV 98.3 100.0 100.4* 103.5*  --  101.2* 103.8* 101.8*  PLT 100* 102* 92* 82*  --  106* 102* 79*   < > = values in this interval not displayed.  -Continue to Monitor for S/Sx of Bleeding; No overt bleeding noted now -Will not repeat labs in the morning given that she is now fully comfort care  DVT prophylaxis: None now that patient is Comfort Care    Code Status: DNR Family Communication: Discussed with Daughter at bedside  Disposition Plan:  Level of care: Med-Surg Status is: Inpatient Remains inpatient appropriate because: Has been transitioned to [place] and expect prognosis to be hours to days with anticipated Hospital Death    Consultants:  Gastroenterology Infectious Diseases Palliative Care Medicine Cardiology  PCCM/Pulmonary   Procedures:  EGD Findings:      Post nasal drainage and  blood was seen in the posterior oropharynx.      Patchy, yellow plaques were found in the entire esophagus. Biopsies were       taken with a cold forceps for histology.      The entire examined stomach was normal.      The cardia and gastric fundus were normal on retroflexion.      A large polypoid mass with no bleeding was found in the second portion       of the duodenum. Biopsies were taken with a cold forceps for histology. Impression:               -  Post nasal drainage is seen in the oropharynx.                           - Esophageal plaques were found, suspicious for                            candidiasis. Biopsied.                           - Normal stomach.                           - Rule out malignancy, duodenal mass. Biopsied.                           No obviouse source in the upper GI tract for                            bleed/hematemeis, likely source is bleeding from                            posterior oropharynx/epistaxis Recommendation:           - Resume previous diet.                           - Continue present medications.                           - Await pathology results.                           - Diflucan (fluconazole) 100 mg PO daily for 3                            weeks.                           - Please consult ENT for epistaxis                           - Ok to restart heparin from GI standpoint   Thoracentesis   Antimicrobials:  Anti-infectives (From admission, onward)    Start     Dose/Rate Route Frequency Ordered Stop   04/02/22 1230  Ampicillin-Sulbactam (UNASYN) 3 g in sodium chloride 0.9 % 100 mL IVPB  Status:  Discontinued        3 g 200 mL/hr over 30 Minutes Intravenous Every 8 hours 04/02/22 1130 04/02/22 1312   04/02/22 1000  levofloxacin (LEVAQUIN) tablet 250 mg  Status:  Discontinued       See Hyperspace for full Linked Orders Report.   250 mg Oral Daily 03/26/22 1624 03/31/22 1014   04/01/22 1000  fluconazole (DIFLUCAN) tablet 200 mg   Status:  Discontinued        200 mg Oral Daily 03/31/22 1606 04/02/22 1317   03/31/22 1000  fluconazole (DIFLUCAN) tablet 100 mg  Status:  Discontinued        100 mg Oral Daily 03/31/22 0843 03/31/22 1606   03/27/22 2200  levofloxacin (LEVAQUIN) IVPB 750 mg  Status:  Discontinued        750 mg 100 mL/hr over 90 Minutes Intravenous Every 48 hours 03/26/22 0919 03/26/22 1624   03/27/22 2200  levofloxacin (LEVAQUIN) IVPB 750 mg       See Hyperspace for full Linked Orders Report.   750 mg 100 mL/hr over 90 Minutes Intravenous Every 48 hours 03/26/22 1624 04/01/22 0006   03/26/22 1215  sulfamethoxazole-trimethoprim (BACTRIM DS) 800-160 MG per tablet 1 tablet  Status:  Discontinued        1 tablet Oral Once per day on Mon Wed Fri 03/26/22 1125 03/14/2022 0749   03/10/2022 2200  levofloxacin (LEVAQUIN) tablet 750 mg  Status:  Discontinued        750 mg Oral Every 48 hours 04/06/2022 1312 03/26/22 0919   03/31/2022 1215  doxycycline (VIBRA-TABS) tablet 100 mg  Status:  Discontinued        100 mg Oral Every 12 hours 03/09/2022 1128 03/11/2022 1312   03/23/22 1300  vancomycin (VANCOREADY) IVPB 750 mg/150 mL  Status:  Discontinued        750 mg 150 mL/hr over 60 Minutes Intravenous Every 24 hours 03/24/2022 1212 03/23/22 0902   03/23/22 1300  vancomycin (VANCOREADY) IVPB 750 mg/150 mL        750 mg 150 mL/hr over 60 Minutes Intravenous Every 24 hours 03/23/22 0902 03/23/22 1442   03/21/2022 2200  piperacillin-tazobactam (ZOSYN) IVPB 3.375 g  Status:  Discontinued        3.375 g 12.5 mL/hr over 240 Minutes Intravenous Every 8 hours 04/04/2022 1447 03/24/2022 1652   03/29/2022 2200  ceFEPIme (MAXIPIME) 2 g in sodium chloride 0.9 % 100 mL IVPB  Status:  Discontinued        2 g 200 mL/hr over 30 Minutes Intravenous Every 12 hours 03/13/2022 1652 03/24/2022 1312   04/03/2022 2200  metroNIDAZOLE (FLAGYL) IVPB 500 mg  Status:  Discontinued        500 mg 100 mL/hr over 60 Minutes Intravenous Every 12 hours 04/01/2022 1652 03/12/2022  1128   03/19/2022 2200  acyclovir (ZOVIRAX) tablet 400 mg  Status:  Discontinued        400 mg Oral 2 times daily 03/21/2022 1807 04/02/22 1312   03/11/2022 1430  piperacillin-tazobactam (ZOSYN) IVPB 3.375 g        3.375 g 100 mL/hr over 30 Minutes Intravenous  Once 03/14/2022 1430 03/28/2022 1953   04/07/2022 1145  vancomycin (VANCOREADY) IVPB 1250 mg/250 mL        1,250 mg 166.7 mL/hr over 90 Minutes Intravenous  Once 03/13/2022 1130 03/24/2022 1559   03/13/2022 1130  ceFEPIme (MAXIPIME) 1 g in sodium chloride 0.9 % 100 mL IVPB  Status:  Discontinued        1 g 200 mL/hr over 30 Minutes Intravenous  Once 03/09/2022 1118 04/06/2022 1129   03/23/2022 1130  ceFEPIme (MAXIPIME) 2 g in sodium chloride 0.9 % 100 mL IVPB        2 g 200 mL/hr over 30 Minutes Intravenous  Once 03/21/2022 1129 03/18/2022 1330   03/20/2022 1115  cefTRIAXone (ROCEPHIN) 1 g in sodium chloride 0.9 % 100 mL IVPB  Status:  Discontinued        1 g 200 mL/hr over 30 Minutes Intravenous  Once 03/29/2022 1114 03/21/2022 1117   03/21/2022 1115  azithromycin (ZITHROMAX) 500 mg in sodium chloride 0.9 % 250 mL IVPB  Status:  Discontinued  500 mg 250 mL/hr over 60 Minutes Intravenous  Once 03/16/2022 1114 03/24/2022 1117       Subjective: Seen and examined at bedside and she is appearing more fatigued.  Denied complaints and appeared a little uncomfortable.  Nursing thinks that she has been weeping from some edema.  No lightheadedness or dizziness and denies any chest pain.  Still little short of breath wearing supplemental oxygen via nasal cannula.  Objective: Vitals:   04/02/22 1333 04/02/22 1541 04/02/22 2109 04/03/22 1118  BP:  (!) 95/42 (!) 104/44 (!) 92/41  Pulse:  72 77 (!) 41  Resp: (!) 35 '20 19 18  '$ Temp:  (!) 97.2 F (36.2 C) (!) 96.8 F (36 C) (!) 96.7 F (35.9 C)  TempSrc:  Axillary Axillary Axillary  SpO2:  100% 98% (!) 86%  Weight:      Height:        Intake/Output Summary (Last 24 hours) at 04/04/2022 1340 Last data filed at  04/04/2022 0800 Gross per 24 hour  Intake 15 ml  Output 350 ml  Net -335 ml   Filed Weights   03/23/22 1225 04/01/2022 1356  Weight: 57.3 kg 57.3 kg   Examination: Physical Exam:  Constitutional: Elderly chronically ill-appearing Caucasian female who was little uncomfortable appears fatigued. Respiratory: Diminished to auscultation bilaterally with coarse breath sounds and has some rhonchi and some crackles noted.  No appreciable wheezing or rails.  Has a slightly increased respiratory effort. No accessory muscle use.  Wearing supplemental oxygen via nasal 4 L Cardiovascular: RRR, no murmurs / rubs / gallops. S1 and S2 auscultated. 1+ LE Edema in the upper and Lower Extremities  Abdomen: Soft, non-tender, non-distended. . Bowel sounds positive.  GU: Deferred. Musculoskeletal: No clubbing / cyanosis of digits/nails. No joint deformities on the upper and lower extremities Skin: No rashes, lesions, ulcers. No induration; Warm and dry.  Neurologic: CN 2-12 grossly intact with no focal deficits. Romberg sign and cerebellar reflexes not assessed.  Psychiatric: A little drowsy appearing and she is awake and answers questions but is slower to respond now  Data Reviewed: I have personally reviewed following labs and imaging studies  CBC: Recent Labs  Lab 03/29/22 1140 03/27/2022 0352 03/29/2022 1411 03/31/22 0726 04/01/22 0135 04/02/22 0234  WBC 4.4 3.5*  --  3.6* 4.3 2.8*  NEUTROABS  --   --   --   --  3.5 2.3  HGB 8.0* 7.7* 7.7* 8.3* 7.6* 7.2*  HCT 24.9* 23.9* 25.6* 25.8* 24.3* 22.4*  MCV 100.4* 103.5*  --  101.2* 103.8* 101.8*  PLT 92* 82*  --  106* 102* 79*   Basic Metabolic Panel: Recent Labs  Lab 03/29/22 1140 03/13/2022 0426 03/31/22 0726 04/01/22 0135 04/02/22 0234  NA 137 137 138 138 138  K 4.4 4.3 4.3 4.1 4.1  CL 110 110 109 108 107  CO2 20* 20* 21* 20* 20*  GLUCOSE 137* 97 125* 129* 93  BUN '13 13 13 14 16  '$ CREATININE 0.85 0.92 0.97 0.86 0.92  CALCIUM 6.9* 6.9* 6.7*  6.5* 6.2*  MG  --   --   --  1.8 2.3  PHOS  --   --   --  1.5* 2.5   GFR: Estimated Creatinine Clearance: 38.6 mL/min (by C-G formula based on SCr of 0.92 mg/dL). Liver Function Tests: Recent Labs  Lab 04/01/22 0135 04/02/22 0234  AST 11* 15  ALT 12 11  ALKPHOS 43 40  BILITOT 0.5 1.0  PROT 5.2* 4.9*  ALBUMIN 2.5* 2.5*   No results for input(s): "LIPASE", "AMYLASE" in the last 168 hours. No results for input(s): "AMMONIA" in the last 168 hours. Coagulation Profile: No results for input(s): "INR", "PROTIME" in the last 168 hours. Cardiac Enzymes: No results for input(s): "CKTOTAL", "CKMB", "CKMBINDEX", "TROPONINI" in the last 168 hours. BNP (last 3 results) No results for input(s): "PROBNP" in the last 8760 hours. HbA1C: No results for input(s): "HGBA1C" in the last 72 hours. CBG: No results for input(s): "GLUCAP" in the last 168 hours. Lipid Profile: No results for input(s): "CHOL", "HDL", "LDLCALC", "TRIG", "CHOLHDL", "LDLDIRECT" in the last 72 hours. Thyroid Function Tests: No results for input(s): "TSH", "T4TOTAL", "FREET4", "T3FREE", "THYROIDAB" in the last 72 hours. Anemia Panel: No results for input(s): "VITAMINB12", "FOLATE", "FERRITIN", "TIBC", "IRON", "RETICCTPCT" in the last 72 hours. Sepsis Labs: No results for input(s): "PROCALCITON", "LATICACIDVEN" in the last 168 hours.  Recent Results (from the past 240 hour(s))  Body fluid culture w Gram Stain     Status: None   Collection Time: 03/26/2022  3:07 PM   Specimen: Thoracentesis; Body Fluid  Result Value Ref Range Status   Specimen Description THORACENTESIS  Final   Special Requests NONE  Final   Gram Stain   Final    WBC PRESENT,BOTH PMN AND MONONUCLEAR NO ORGANISMS SEEN CYTOSPIN SMEAR    Culture   Final    NO GROWTH 3 DAYS Performed at [facility], [address], [place], [place] [address]    Report Status 03/29/2022 FINAL  Final    Radiology Studies: No results found.  Scheduled Meds:   Chlorhexidine Gluconate Cloth  6 each Topical Daily   pantoprazole  40 mg Intravenous Q12H   sodium chloride flush  3 mL Intravenous Q12H   sodium chloride flush  3 mL Intravenous Q12H   Continuous Infusions:  sodium chloride 10 mL/hr at 03/31/22 0513    LOS: 13 days   Raiford Noble, DO [facility] Available via Epic secure chat 7am-7pm After these hours, please refer to coverage provider listed on [email or web address] 04/04/2022, 1:40 PM

## 2022-04-04 NOTE — TOC Progression Note (Addendum)
Transition of Care [facility]) - Progression Note    Patient Details  Name: Courtney Arnold MRN: [id number] Date of Birth: Apr 27, 1940  Transition of Care [facility]) CM/SW [place], [place] Phone Number: 04/04/2022, 10:26 AM  Clinical Narrative:     CSW spoke with pt's daughter Vivien Rota, discussed bed offers, Vivien Rota request to speak with her sister about the offers but leaning towards [facility]. CSW will follow up with family tomorrow.  Expected Discharge Plan: [place] Barriers to Discharge: Continued Medical Work up  Expected Discharge Plan and Services In-house Referral: Clinical Social Work     Living arrangements for the past 2 months: Single Family Home                                       Social Determinants of Health (SDOH) Interventions [place]: No Food Insecurity (01/14/2022)  Housing: Low Risk  (01/14/2022)  Transportation Needs: No Transportation Needs (01/14/2022)  Utilities: Not At Risk (01/14/2022)  Alcohol Screen: Low Risk  (02/06/2020)  Depression (PHQ2-9): Low Risk  (12/28/2019)  Financial Resource Strain: Low Risk  (05/08/2019)  Physical Activity: Inactive (05/08/2019)  Social Connections: Moderately Isolated (02/06/2020)  Stress: No Stress Concern Present (05/08/2019)  Tobacco Use: Medium Risk (03/28/2022)    Readmission Risk Interventions     No data to display

## 2022-04-05 DIAGNOSIS — R0600 Dyspnea, unspecified: Secondary | ICD-10-CM

## 2022-04-05 DIAGNOSIS — C9000 Multiple myeloma not having achieved remission: Secondary | ICD-10-CM | POA: Diagnosis not present

## 2022-04-05 DIAGNOSIS — Z515 Encounter for palliative care: Secondary | ICD-10-CM | POA: Diagnosis not present

## 2022-04-05 DIAGNOSIS — J90 Pleural effusion, not elsewhere classified: Secondary | ICD-10-CM | POA: Diagnosis not present

## 2022-04-05 DIAGNOSIS — J189 Pneumonia, unspecified organism: Secondary | ICD-10-CM | POA: Diagnosis not present

## 2022-04-08 ENCOUNTER — Inpatient Hospital Stay: Payer: Medicare Other

## 2022-04-08 ENCOUNTER — Ambulatory Visit: Payer: Medicare Other | Admitting: Hematology

## 2022-04-08 NOTE — Death Summary Note (Signed)
DEATH SUMMARY   Patient Details  Name: Courtney Arnold MRN: [id number] DOB: 04/30/1940 SFK:[id number], Dirk Dress, MD Admission/Discharge Information   Admit Date:  April 18, 2022  Date of Death:  02-May-2022  Time of Death:   15:03  Length of Stay: 14   Principle Cause of death: Acute Cardiopulmonary Arrest from Aspiration and Right Pleural Effusion  Hospital Diagnoses: Principal Problem:   Multifocal pneumonia with Lung Abscess Active Problems:   Symptomatic anemia   Lung abscess ([place])   Paroxysmal atrial fibrillation with RVR ([place])   Multiple myeloma not having achieved remission (HCC)   Pancytopenia, acquired -Multiple Myeloma Related   Thrombocytopenia ([place])   Hypothyroidism following radioiodine therapy   Hypothyroidism   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HCC)   Pleural effusion   Postprocedural pneumothorax   Hospital Course: The patient is an 82 year old female with multiple myeloma, paroxysmal atrial fibrillation, COPD, pancytopenia required packed red blood cell transfusion as well as platelet transfusion, hypothyroidism as well as other comorbidities who presented to [facility] on 04/18/22, vomiting and poor oral intake for last 3 weeks.  She underwent further imaging and CT chest abdomen pelvis revealed multifocal pneumonia and possible abscess in the right middle lobe with moderate right and small left pleural effusions.  She was transferred to [facility] on 03/23/2022 at the recommendation of pulmonary to obtain a pulmonary consult and she developed A-fib with RVR and started on amiodarone infusion.   On 03/24/2022 pulmonary recommended holding off bronchial containing antibiotics and switch IV amiodarone to oral   03/29/2022 she underwent a thoracentesis of the right pleural space with 200 cc of cloudy fluid obtained and she had a small iatrogenic pneumothorax.  ID was consulted she started on levofloxacin and there was a discussion with her oncologist as below   On  03/26/2022 she started having frequent small stools which continued and on 03/28/2022 heparin was resumed and Imodium frequency was increased.  On 03/29/2022 she had an episode of hematemesis and protamine given and anticoagulation was stopped.   On 03/28/2022 she is felt not to have capacity make her own medical decisions and goals of care discussion was held with her daughters and the attending physician at that time.  Attending physician Dr. Wynelle Cleveland felt that she had a very poor prognosis and was still not able to eat and having multiple loose stools along with her multiple myeloma, pancytopenia as well as new onset A-fib with RVR as well as pneumonia and lung abscess and transudative pleural effusion and slight pneumothorax that she would have a poor outcome.  Family did not want to transition her to comfort care and wanted to treat the treatable.  Family felt that she could not go home.   After her episode of hematemesis she did have a nosebleed and GI scoped the patient and felt that the bleeding was from her nose given her history of epistaxis.  GI has since signed off the case but did find esophageal plaques suspicious for candidiasis and so she was started on fluconazole and the ID team increase this to 200 mg daily.     04/01/2022 she appears little volume overloaded so we will give her dose of IV Lasix just 20 mg and given a dose of IV albumin 25 g as well.  Diet will be advanced and she has no further epistaxis so we will consider changing her from IV heparin to an oral anticoagulant but will wait to see if she requires a repeat thoracentesis.  May need  to reengage pulmonary given that her right pleural effusion is slightly worsening.  Palliative care continue to have goals of care discussion.   **04/02/22: Further decline and became more hypoxic so oxygen requirements increased.  Repeat chest x-ray showed worsening of her chest x-ray and she was possibly aspirating so Unasyn was started.  Pulmonary was  reconsulted and they did a bedside ultrasound which showed a very large pleural effusion.  Palliative care was reengaged and given her clinical worsening and after further family discussion a decision was made to transition the patient to full comfort measures and all medications not in the patient's comfort have been discontinued and she has been started on acetaminophen of 5 mg 3 times daily, albuterol nebs, bisacodyl suppositories, Ativan IV anxiety IV morphine 1 mg every hour for moderate pain and dyspnea, ondansetron as well as Liquifilm Tears.     Patient had a high risk for further clinical decompensation and worsening if she was not transitioned to residential hospice so was anticipating in-hospital death given her extremely poor prognosis and multiple medical comorbidities. She appeared to be worsening so In-hospital death is expected and she further decompensated as expected and passed away on Apr 06, 2022 at 15:03 with Family present at bedside.   Assessment and Plan:  Aspiration from vomiting at home but cannot rule out other infections as she is immune compromised -CT reveals bilateral multifocal nodules with consolidation in the right middle lobe, right moderate pleural effusion and small left pleural effusion-near complete resolution of previously noted left upper lobe consolidation  -ID has evaluated the patient and recommended oral levaquin and have now placed a stop date for this. Given ongoing loose stools they advise continuing through 1/25 and then trial off it -Continue with albuterol 2.5 mg nebs every 2 as needed for wheezing and shortness of breath, dextromethorphan-guaifenesin 1 tab p.o. twice daily, -Will add Flutter valve and incentive spirometry -Continue monitor respiratory status carefully and repeat chest x-ray in a.m. [name] was following and recommended antibiotics for 14 days as well as aspiration precautions and pulmonary hygiene -Pulmonary was reconsulted for repeat  thoracentesis that she has a very large right-sided pleural effusion on ultrasound.  After further goals of care discussion with the patient and the family decision was made to transition the patient to full comfort measures and stopping all medications not in patient's comfort and focus on symptomatic management   Epistaxis causing Hematemesis, improving  -Has hx of nosebleeds and had one yesterday that was minor and resolved spontaneously.  Yesterday morning RN reported vomiting up blood after being given water. Has not had recurrence. Hgb 7.7 today from 8 yesterday. -GI evaluated and was n.p.o. yesterday for EGD and now on a clear liquid diet; EGD was done and there is no obvious source in the upper GI tract for bleeding or hematemesis and likely the source of bleeding from the posterior oropharynx and epistaxis -Now off lovenox, protamine given -Will try to wean off o2 as likely contributes to nosebleed. If unable to do so will plan on humidified air. Will also continue Oxymetazoline 1 spray each nare BID  -Hgb/Hct Trend: Last Labs           Recent Labs  Lab 03/28/22 1257 03/29/22 1140 03/27/2022 0352 03/28/2022 1411 03/31/22 0726 04/01/22 0135 04/02/22 0234  HGB 8.1* 8.0* 7.7* 7.7* 8.3* 7.6* 7.2*  HCT 25.1* 24.9* 23.9* 25.6* 25.8* 24.3* 22.4*  MCV 100.0 100.4* 103.5*  --  101.2* 103.8* 101.8*    -Was on PPI  but now stopped due to [place] -FOBT was Positive  -If she continues to currently have epistaxis we will consult ENT for further evaluation and recommendations however she is being transitioned to full comfort care now   Duodenal Mass -Large polypoid mass with no bleeding was found in the second portion of the duodenum and biopsies were taken with cold forceps for histology and these are currently pending   Iatrogenic right pneumothorax Acute Hypoxic respiratory failure -Post thoracentesis - improving but Re-occurring Pleural Effusion so will discuss with Pulmonary   -SpO2:  (!) 86 % O2 Flow Rate (L/min): 4 L/min FiO2 (%): 50 % -Levofloxacin for antibiotic coverage has been discontinued however given that there is concern that she was aspirating SLP was ordered and she was placed back on IV Unasyn. -Continue with albuterol 2.5 mg nebs every 2 as needed for wheezing and shortness of breath, dextromethorphan-guaifenesin 1 tab p.o. twice daily, -Pulmonary consulted for further evaluation and they did a bedside ultrasound which showed a very large right pleural effusion.  After further goals of care discussion family does not want a repeat thoracentesis and she is being transitioned to comfort care.   Hypotension, blood pressure remains on the lower side -BP was on the softer side -She remains volume overloaded and hesitant to give more fluid.  Dose of IV albumin was ordered however she did not IV access. -Continue to Monitor and Trend BP's per Protocol -Last BP was 92/41 and on the softer side on last check but no longer checking BP due to being Comfort Care -Will order TED hose and IV Albumin 25 g x1 -Subsequently she is being transitioned to full comfort care measures   Nausea/ vomiting with poor oral intake -She has had vomiting for few weeks and continued to do so in the hospital -Abdomen distended on exam but has been non tender  -CT scan on 1/16 which revealed a surgically absent gallbladder, CBD of 8 mm, small hiatal hernia with circumferential esophageal wall thickening suggestive of GERD or less likely esophageal neoplasm -The patient takes Decadron twice a week as outpt -Per family and RN, she vomits/regurgitates just within a few minutes of eating-  -SLP eval done- no signs of aspiration -No thrush noted on exam but noted to have Candidiasis on EGD so now on Fluconazole  -Will discontinue all medications not in the patient's comfort given that she is being transitioned to comfort care continue symptomatic management   Loose stool -Possibly from  antibiotics- abdomen is still non tender so less likely it is infectious. This morning loose stool but not watery -C/w Imodium PRN   Multiple myeloma, relapsed Fatigue related to cancer -Receiving pomalidomide as outpatient with 1/16 to have been the date of her next cycle -Has lesions throughout her skeleton -Receives acyclovir prophylaxis twice a day, Decadron and Ritalin -Her oncologist has recommended to hold Pomalidomide & Decadron and follow-up with him as outpatient to decide on whether or not to resume these -Being transitioned to [place] now and all medications and interventions stopped that are not in line with the Comfort Focus   Paroxysmal atrial fibrillation with RVR -new diagnosis Pleural effusions; Right > Left  Mod pulm HTN- possibility of diastolic dysfunction -[id number] score is 4 -Transthoracic ECHO> EF 55-60%, unable to evaluate diastolic function, b/l atrial dilation, mild to mod TR, mo elevated pulm pressure, normal IV function with mild RV enlargement -Placed on IV amiodarone and transitioned to oral amiodarone   -Given a dose of  IV Lasix 20 mg x 1 -Plan was for 400 mg twice daily x 10 days (stop date 1/25), then 200 mg daily and then outpatient follow-up but will stop given transitioning to [place]  -Anticoagulation resumed but will discontinue that she is [place] done showed "Increased airspace opacity in the right mid lung and right lung apex concerning for superimposed  pneumonia or asymmetric edema. Small bilateral pleural effusions. Increased interstitial accentuation in the left lung, potentially from interstitial edema or atypical pneumonia. Old left rib fractures. Thoracic spondylosis with remote lower thoracic compression fractures and multiple vertebral augmentations."   Acute Urinary Retention -RN says foley was placed for retention and Foley catheter was initially discontinued however she continues to retain so Foley catheter will  be reinserted and will be maintained for comfort    Right Lower Extremity Swelling -Per daughter this is new. U/s 1/22 shows no signs DVT   Left Upper Extremity Swelling -U/S ordered was completed today and showed no evidence of the deep vein thrombosis in the upper extremites but findings are consistent with an acute superficial vein thrombosis involving the left cephalic vein -Continue with elevation -Back on Anticoagulation but now will discontinue that patient is now [place] and Physical Deconditioning  -PT advising SNF, family interested in that. TOC consulted for assistance with placement but now has a high chance for In Hospital death given that she has been transitioned to [facility] and may consider [facility] as opposed to SNF   Hypothyroidism -TSH was 5.614 -Likely sick euthyroid -Discontinue Levothyroxine 137 mcg po Daily given that she is now comfort care all   Esophageal Candidiasis -Noted on EGD -GI recommending Fluconazole 100 mg po Daily x3 Weeks but this was increased to 200 mg po Daily; However after being transitioned to [place] her Fluconazole is stopped  -Will not repeat labs in the morning given that she is now fully Comfort Care   Metabolic Acidosis -Patient's CO2 is now 20, AG is 11, and Chloride Level is now 107 on last check  -Will not repeat labs in the morning given that she is now fully Comfort Care   Hypophosphatemia -Phos Level Trend: Last Labs       Recent Labs  Lab 03/23/22 0500 04/01/22 0135 04/02/22 0234  PHOS 2.8 1.5* 2.5    -Replete with po K Phos Neutral 500 mg po BID x 2  -Continue to Monitor and Replete as Necessary -Will not repeat labs in the morning given that she is now fully Comfort Care   Pancytopenia in the Setting of Multiple Myeloma -CBC Trend: Last Labs            Recent Labs  Lab 03/27/22 0117 03/28/22 1257 03/29/22 1140 03/19/2022 0352 03/18/2022 1411 03/31/22 0726  04/01/22 0135 04/02/22 0234  WBC 5.2 4.7 4.4 3.5*  --  3.6* 4.3 2.8*  HGB 7.7* 8.1* 8.0* 7.7*   < > 8.3* 7.6* 7.2*  HCT 23.5* 25.1* 24.9* 23.9*   < > 25.8* 24.3* 22.4*  MCV 98.3 100.0 100.4* 103.5*  --  101.2* 103.8* 101.8*  PLT 100* 102* 92* 82*  --  106* 102* 79*   < > = values in this interval not displayed.    -Continue to Monitor for S/Sx of Bleeding; No overt bleeding noted now -Will not repeat labs in the morning given that she is now fully comfort care  Procedures: EGD Findings:      Post  nasal drainage and blood was seen in the posterior oropharynx.      Patchy, yellow plaques were found in the entire esophagus. Biopsies were       taken with a cold forceps for histology.      The entire examined stomach was normal.      The cardia and gastric fundus were normal on retroflexion.      A large polypoid mass with no bleeding was found in the second portion       of the duodenum. Biopsies were taken with a cold forceps for histology. Impression:               - Post nasal drainage is seen in the oropharynx.                           - Esophageal plaques were found, suspicious for                            candidiasis. Biopsied.                           - Normal stomach.                           - Rule out malignancy, duodenal mass. Biopsied.                           No obviouse source in the upper GI tract for                            bleed/hematemeis, likely source is bleeding from                            posterior oropharynx/epistaxis Recommendation:           - Resume previous diet.                           - Continue present medications.                           - Await pathology results.                           - Diflucan (fluconazole) 100 mg PO daily for 3                            weeks.                           - Please consult ENT for epistaxis                           - Ok to restart heparin from GI standpoint  Consultations:  Gastroenterology Infectious Diseases Palliative Care Medicine Cardiology  PCCM/Pulmonary   The results of significant diagnostics from this hospitalization (including imaging, microbiology, ancillary and laboratory) are listed below for reference.   Significant Diagnostic Studies: DG CHEST PORT 1 VIEW  Result Date: 04/02/2022 CLINICAL DATA:  Shortness  of breath EXAM: PORTABLE CHEST 1 VIEW COMPARISON:  04/01/2022 FINDINGS: Blunting of both costophrenic angles, right greater than left, compatible with pleural effusions. Increased airspace opacity in the right mid lung and right lung apex concerning for superimposed pneumonia or asymmetric edema. Increased interstitial accentuation in the left lung. Left rib deformities compatible with old fractures. Thoracic spondylosis with remote lower thoracic compression fractures and multiple vertebral augmentations. Atherosclerotic calcification of the aortic arch. IMPRESSION: 1. Increased airspace opacity in the right mid lung and right lung apex concerning for superimposed pneumonia or asymmetric edema. 2. Small bilateral pleural effusions. 3. Increased interstitial accentuation in the left lung, potentially from interstitial edema or atypical pneumonia. 4. Old left rib fractures. Thoracic spondylosis with remote lower thoracic compression fractures and multiple vertebral augmentations. Electronically Signed   By: Van Clines M.D.   On: 04/02/2022 08:25   DG CHEST PORT 1 VIEW  Result Date: 04/01/2022 CLINICAL DATA:  Shortness of breath. EXAM: PORTABLE CHEST 1 VIEW COMPARISON:  03/27/2022. FINDINGS: 0458 hours. Increased moderate right and small left pleural effusions. No pneumothorax. Similar airspace opacities right lung. Stable mild cardiomegaly and mediastinal contours. Old rib fracture deformity of the left chest wall. Prior vertebral augmentation at multiple levels and unchanged vertebral compression deformities. IMPRESSION: Increased moderate right  and small left pleural effusions. No pneumothorax. Similar airspace opacities in the right mid lung. Electronically Signed   By: Emmit Alexanders M.D.   On: 04/01/2022 08:20   VAS [place] UPPER EXTREMITY VENOUS DUPLEX  Result Date: 03/31/2022 UPPER VENOUS STUDY  Patient Name:  LAMETRIA KLUNK  Date of Exam:   03/31/2022 Medical Rec #: [id number]         Accession #:    [id number] Date of Birth: 21-Mar-1940         Patient Gender: F Patient Age:   38 years Exam Location[facility]  [facility] Procedure:      VAS [place] UPPER EXTREMITY VENOUS DUPLEX Referring Phys: Laurey Arrow --------------------------------------------------------------------------------  Indications: Swelling Risk Factors: None identified. Limitations: Poor ultrasound/tissue interface. Comparison Study: No prior studies. Performing Technologist: Oliver Hum RVT  Examination Guidelines: A complete evaluation includes B-mode imaging, spectral Doppler, color Doppler, and power Doppler as needed of all accessible portions of each vessel. Bilateral testing is considered an integral part of a complete examination. Limited examinations for reoccurring indications may be performed as noted.  Right Findings: +----------+------------+---------+-----------+----------+-------+ RIGHT     CompressiblePhasicitySpontaneousPropertiesSummary +----------+------------+---------+-----------+----------+-------+ Subclavian    Full       Yes       Yes                      +----------+------------+---------+-----------+----------+-------+  Left Findings: +----------+------------+---------+-----------+----------+-------+ LEFT      CompressiblePhasicitySpontaneousPropertiesSummary +----------+------------+---------+-----------+----------+-------+ IJV           Full       Yes       Yes                      +----------+------------+---------+-----------+----------+-------+ Subclavian    Full       Yes       Yes                       +----------+------------+---------+-----------+----------+-------+ Axillary      Full       Yes       Yes                      +----------+------------+---------+-----------+----------+-------+  Brachial      Full       Yes       Yes                      +----------+------------+---------+-----------+----------+-------+ Radial        Full                                          +----------+------------+---------+-----------+----------+-------+ Ulnar         Full                                          +----------+------------+---------+-----------+----------+-------+ Cephalic      None                                   Acute  +----------+------------+---------+-----------+----------+-------+ Basilic       Full                                          +----------+------------+---------+-----------+----------+-------+ Thrombus located in the cephalic vein is noted to extend from the [facility] into the mid forearm.  Summary:  Right: No evidence of thrombosis in the subclavian.  Left: No evidence of deep vein thrombosis in the upper extremity. Findings consistent with acute superficial vein thrombosis involving the left cephalic vein.  *See table(s) above for measurements and observations.  Diagnosing physician: Harold Barban MD Electronically signed by Harold Barban MD on 03/31/2022 at 8:55:52 PM.    Final    VAS [place] LOWER EXTREMITY VENOUS (DVT)  Result Date: 03/29/2022  Lower Venous DVT Study Patient Name:  TAHJAE DURR  Date of Exam:   03/29/2022 Medical Rec #: [id number]         Accession #:    [id number] Date of Birth: 1940-09-05         Patient Gender: F Patient Age:   89 years Exam Location:  [facility] Procedure:      VAS [place] LOWER EXTREMITY VENOUS (DVT) Referring Phys: Laurey Arrow --------------------------------------------------------------------------------  Indications: Swelling, and Edema.  Comparison Study: no prior Performing Technologist: Archie Patten RVS   Examination Guidelines: A complete evaluation includes B-mode imaging, spectral Doppler, color Doppler, and power Doppler as needed of all accessible portions of each vessel. Bilateral testing is considered an integral part of a complete examination. Limited examinations for reoccurring indications may be performed as noted. The reflux portion of the exam is performed with the patient in reverse Trendelenburg.  +---------+---------------+---------+-----------+----------+--------------+ RIGHT    CompressibilityPhasicitySpontaneityPropertiesThrombus Aging +---------+---------------+---------+-----------+----------+--------------+ CFV      Full           Yes      Yes                                 +---------+---------------+---------+-----------+----------+--------------+ SFJ      Full                                                        +---------+---------------+---------+-----------+----------+--------------+  FV Prox  Full                                                        +---------+---------------+---------+-----------+----------+--------------+ FV Mid   Full                                                        +---------+---------------+---------+-----------+----------+--------------+ FV DistalFull                                                        +---------+---------------+---------+-----------+----------+--------------+ PFV      Full                                                        +---------+---------------+---------+-----------+----------+--------------+ POP      Full           Yes      Yes                                 +---------+---------------+---------+-----------+----------+--------------+ PTV      Full                                                        +---------+---------------+---------+-----------+----------+--------------+ PERO     Full           Yes      Yes                                  +---------+---------------+---------+-----------+----------+--------------+     Summary: RIGHT: - There is no evidence of deep vein thrombosis in the lower extremity.  - No cystic structure found in the popliteal fossa.  LEFT: - No evidence of common femoral vein obstruction.  *See table(s) above for measurements and observations. Electronically signed by Servando Snare MD on 03/29/2022 at 7:48:34 PM.    Final    DG Chest Port 1 View  Result Date: 03/27/2022 CLINICAL DATA:  Acute respiratory failure. EXAM: PORTABLE CHEST 1 VIEW COMPARISON:  03/26/2022 FINDINGS: The cardio pericardial silhouette is enlarged. Airspace disease at the bases, right greater than left is again noted with small bilateral pleural effusions. Interval decrease in pleural gas at the right apex although pleural line remains visible. Probable skin fold over the left apex. Bones are diffusely demineralized with multilevel thoracolumbar vertebral augmentation and lower thoracic vertebral fractures evident. IMPRESSION: 1. Interval decrease in pleural gas at the right apex although pleural line remains visible. 2. Bibasilar airspace disease, right greater than left with small bilateral pleural effusions. Electronically Signed  By: Misty Stanley M.D.   On: 03/27/2022 10:29   DG Abd 1 View  Result Date: 03/26/2022 CLINICAL DATA:  Constipation EXAM: ABDOMEN - 1 VIEW COMPARISON:  Abdominal radiograph dated March 24, 2022 FINDINGS: Prominent gas-filled loops of colon small amount of scattered air seen in the small bowel, overall nonobstructive bowel-gas pattern. No significant stool burden visualized. Mild left basilar atelectasis. No acute osseous abnormality. IMPRESSION: Nonobstructive bowel-gas pattern. Electronically Signed   By: Yetta Glassman M.D.   On: 03/26/2022 17:00   DG CHEST PORT 1 VIEW  Result Date: 03/26/2022 CLINICAL DATA:  [id number] Pneumothorax on right [id number] EXAM: PORTABLE CHEST 1 VIEW COMPARISON:  Chest radiograph from one  day prior. FINDINGS: Stable cardiomediastinal silhouette with normal heart size. Small right hydropneumothorax with stable small 5% right apical pneumothorax component and stable small basilar right pleural effusion component. No left pneumothorax. No left pleural effusion. Stable patchy consolidation throughout the right greater than left lungs, most prominent in the right perihilar lung. IMPRESSION: 1. Stable small right hydropneumothorax. 2. Stable patchy consolidation throughout the right greater than left lungs, most prominent in the right perihilar lung, compatible with multilobar pneumonia. Electronically Signed   By: Ilona Sorrel M.D.   On: 03/26/2022 08:19   DG Chest Port 1 View  Result Date: 03/29/2022 CLINICAL DATA:  History of recent thoracentesis, follow-up right pneumothorax. EXAM: PORTABLE CHEST 1 VIEW COMPARISON:  Films from earlier in the same day. FINDINGS: Right-sided pneumothorax is noted slightly increased in the lateral component when compare with the prior exam. The apical component is less well visualized. Persistent airspace opacity in the right lung is noted. Skin fold is noted over the left lateral chest. Old rib fractures are seen on the left. Cardiac shadow is stable. Changes of prior vertebral augmentation are noted in the thoracic spine. IMPRESSION: Right-sided pneumothorax is again identified with slight increase in the lateral component. There is approximately 12 mm of excursion laterally. The apical component is less well appreciated. The remainder of the exam is stable from the prior study. Electronically Signed   By: Inez Catalina M.D.   On: 03/14/2022 20:22   DG Chest Port 1 View  Addendum Date: 03/31/2022   ADDENDUM REPORT: 03/20/2022 15:52 ADDENDUM: The original report was by Dr. Van Clines. The following addendum is by Dr. Van Clines: Critical Value/emergent results were called by telephone at the time of interpretation on 03/16/2022 at 3:45 pm to provider  Elisabeth Cara SOOD , who verbally acknowledged these results. Electronically Signed   By: Van Clines M.D.   On: 04/07/2022 15:52   Result Date: 03/16/2022 CLINICAL DATA:  Status post right thoracentesis EXAM: PORTABLE CHEST 1 VIEW COMPARISON:  04/02/2022 at 6:11 a.m. FINDINGS: New 5% right pneumothorax. Indistinct airspace opacity in the right lower lobe and right middle lobe persists. Interstitial accentuation noted in both lungs. Mild enlargement of the cardiopericardial silhouette. Atherosclerotic calcification of the aortic arch. IMPRESSION: 1. New 5% right pneumothorax. 2. Indistinct airspace opacity in the right middle lobe and right lower lobe potentially from pneumonia or atelectasis. 3. Mild enlargement of the cardiopericardial silhouette. 4. Interstitial accentuation in both lungs. 5. Atherosclerotic calcification of the aortic arch. Radiology assistant personnel have been notified to put me in telephone contact with the referring physician or the referring physician's clinical representative in order to discuss these findings. Once this communication is established I will issue an addendum to this report for documentation purposes. Electronically Signed: By: Van Clines M.D. On: 03/15/2022 15:33  DG Chest Port 1 View  Result Date: 04/04/2022 CLINICAL DATA:  Pneumonia. EXAM: PORTABLE CHEST 1 VIEW COMPARISON:  CT March 22, 2022. FINDINGS: The heart size and mediastinal contours are partially obscured but appear borderline enlarged. Moderate right and small left pleural effusions. Right middle lobe airspace consolidation. Additional patchy bilateral airspace consolidations. Prior vertebral body augmentation. IMPRESSION: Multifocal pneumonia with parapneumonic effusions. Electronically Signed   By: Dahlia Bailiff M.D.   On: 03/26/2022 08:25   DG Abd 1 View  Result Date: 03/24/2022 CLINICAL DATA:  Abdominal pain, distension, vomiting EXAM: ABDOMEN - 1 VIEW COMPARISON:  CT abdomen  03/19/2022 FINDINGS: Contrast medium in the urinary bladder. Lower thoracic vertebral augmentations noted. Bony demineralization. Gas is present primarily in the colon. No dilated bowel observed. Blunting of the right costophrenic angle compatible with small right pleural effusion. Chronic L4 compression noted. Lower thoracic compressions are likewise chronic. IMPRESSION: 1. No dilated bowel observed. 2. Small right pleural effusion. 3. Bony demineralization with lower thoracic and lumbar compressions. 4. Contrast medium in the urinary bladder. Electronically Signed   By: Van Clines M.D.   On: 03/24/2022 14:56   ECHOCARDIOGRAM COMPLETE  Result Date: 03/29/2022    ECHOCARDIOGRAM REPORT   Patient Name:   LIYAT FAULKENBERRY Date of Exam: 03/15/2022 Medical Rec #:  [id number]        Height:       62.0 in Accession #:    [id number]       Weight:       126.3 lb Date of Birth:  1940/11/14        BSA:          1.572 m Patient Age:    36 years         BP:           82/52 mmHg Patient Gender: F                HR:           124 bpm. Exam Location:  [facility] Procedure: 2D Echo, Cardiac Doppler and Color Doppler Indications:    Atrial Fibrillation I48.91  History:        Patient has no prior history of Echocardiogram examinations.                 Arrythmias:Atrial Fibrillation; Risk Factors:Former Smoker and                 Hypertension.  Sonographer:    Greer Pickerel Referring Phys: [id number] VISHNU P MALLIPEDDI  Sonographer Comments: Suboptimal subcostal window. Image acquisition challenging due to respiratory motion and Image acquisition challenging due to uncooperative patient. IMPRESSIONS  1. Left ventricular ejection fraction, by estimation, is 55 to 60%. The left ventricle has normal function. Left ventricular endocardial border not optimally defined to evaluate regional wall motion. Left ventricular diastolic function could not be evaluated.  2. Right ventricular systolic function is normal. The right ventricular  size is mildly enlarged. There is moderately elevated pulmonary artery systolic pressure. The estimated right ventricular systolic pressure is 03.5 mmHg.  3. Left atrial size was severely dilated.  4. Right atrial size was severely dilated.  5. The mitral valve is abnormal. Mild mitral valve regurgitation. No evidence of mitral stenosis.  6. Tricuspid valve regurgitation is mild to moderate.  7. The aortic valve was not well visualized. Aortic valve regurgitation is trivial. No aortic stenosis is present.  8. The inferior vena cava is normal in size with <50%  respiratory variability, suggesting right atrial pressure of 8 mmHg. Comparison(s): No prior Echocardiogram. FINDINGS  Left Ventricle: Left ventricular ejection fraction, by estimation, is 55 to 60%. The left ventricle has normal function. Left ventricular endocardial border not optimally defined to evaluate regional wall motion. The left ventricular internal cavity size was normal in size. There is no left ventricular hypertrophy. Left ventricular diastolic function could not be evaluated due to atrial fibrillation. Left ventricular diastolic function could not be evaluated. Right Ventricle: The right ventricular size is mildly enlarged. No increase in right ventricular wall thickness. Right ventricular systolic function is normal. There is moderately elevated pulmonary artery systolic pressure. The tricuspid regurgitant velocity is 3.24 m/s, and with an assumed right atrial pressure of 8 mmHg, the estimated right ventricular systolic pressure is 06.2 mmHg. Left Atrium: Left atrial size was severely dilated. Right Atrium: Right atrial size was severely dilated. Pericardium: There is no evidence of pericardial effusion. Mitral Valve: The mitral valve is abnormal. Mild to moderate mitral annular calcification. Mild mitral valve regurgitation. No evidence of mitral valve stenosis. Tricuspid Valve: The tricuspid valve is normal in structure. Tricuspid valve  regurgitation is mild to moderate. No evidence of tricuspid stenosis. Aortic Valve: The aortic valve was not well visualized. Aortic valve regurgitation is trivial. No aortic stenosis is present. Pulmonic Valve: The pulmonic valve was normal in structure. Pulmonic valve regurgitation is not visualized. No evidence of pulmonic stenosis. Aorta: The aortic root and ascending aorta are structurally normal, with no evidence of dilitation. Venous: The inferior vena cava is normal in size with less than 50% respiratory variability, suggesting right atrial pressure of 8 mmHg. IAS/Shunts: The interatrial septum was not well visualized.  LEFT VENTRICLE PLAX 2D LVIDd:         3.50 cm   Diastology LVIDs:         2.40 cm   LV e' medial:    9.30 cm/s LV PW:         0.80 cm   LV E/e' medial:  13.0 LV IVS:        0.70 cm   LV e' lateral:   10.80 cm/s LVOT diam:     1.90 cm   LV E/e' lateral: 11.2 LV SV:         31 LV SV Index:   20 LVOT Area:     2.84 cm  RIGHT VENTRICLE RV S prime:     11.30 cm/s TAPSE (M-mode): 1.4 cm LEFT ATRIUM             Index        RIGHT ATRIUM           Index LA diam:        3.90 cm 2.48 cm/m   RA Area:     22.10 cm LA Vol (A2C):   75.4 ml 47.95 ml/m  RA Volume:   69.80 ml  44.39 ml/m LA Vol (A4C):   72.7 ml 46.23 ml/m LA Biplane Vol: 74.8 ml 47.57 ml/m  AORTIC VALVE LVOT Vmax:   84.90 cm/s LVOT Vmean:  59.500 cm/s LVOT VTI:    0.111 m  AORTA Ao Root diam: 3.20 cm Ao Asc diam:  3.70 cm MITRAL VALVE                TRICUSPID VALVE MV Area (PHT): 5.06 cm     TR Peak grad:   42.0 mmHg MV Decel Time: 150 msec     TR Vmax:  324.00 cm/s MV E velocity: 121.00 cm/s                             SHUNTS                             Systemic VTI:  0.11 m                             Systemic Diam: 1.90 cm Vishnu Priya Mallipeddi Electronically signed by Lorelee Cover Mallipeddi Signature Date/Time: 03/11/2022/4:22:37 PM    Final    CT Chest Wo Contrast  Result Date: 03/11/2022 CLINICAL DATA:  Right middle  lobe abnormality noted on same day CT abdomen and pelvis EXAM: CT CHEST WITHOUT CONTRAST TECHNIQUE: Multidetector CT imaging of the chest was performed following the standard protocol without IV contrast. RADIATION DOSE REDUCTION: This exam was performed according to the departmental dose-optimization program which includes automated exposure control, adjustment of the mA and/or kV according to patient size and/or use of iterative reconstruction technique. COMPARISON:  CT chest dated 09/27/2021 FINDINGS: Cardiovascular: Normal heart size. No significant pericardial fluid/thickening. Coronary artery calcifications and aortic atherosclerosis. Great vessels are normal in course and caliber. Mediastinum/Nodes: Imaged thyroid gland without nodules meeting criteria for imaging follow-up by size. Normal esophagus. No pathologically enlarged axillary, supraclavicular, mediastinal, or hilar lymph nodes. Lungs/Pleura: The central airways are patent. Near-complete resolution of previously noted left upper lobe consolidation. Interval development of multifocal new nodules and consolidation, predominantly involving the right middle lobe, where there is a gas-containing fluid collection measuring 2.2 x 1.6 cm in the base of the right middle lobe (2:87). No focal consolidation. No pneumothorax. Moderate right and trace left pleural effusions. Upper abdomen: Partially imaged bilateral renal cysts. Musculoskeletal: Diffusely heterogeneous appearance of the skeleton in keeping with known multiple myeloma. Prior vertebral augmentations of T6, T7 and T12 with additional compression deformities of T9 and T10. Similar sclerotic appearance of the left posterolateral seventh rib IMPRESSION: 1. Interval development of multifocal new nodules and consolidation, predominantly involving the right middle lobe, where there is a gas-containing fluid collection measuring 2.2 x 1.6 cm in the base of the right middle lobe. Findings are most  consistent with multifocal pneumonia with abscess formation. 2. Near-complete resolution of previously noted left upper lobe consolidation. 3. Moderate right and trace left pleural effusions. 4. Diffusely heterogeneous appearance of the skeleton in keeping with known multiple myeloma. 5. Coronary artery calcifications. Aortic Atherosclerosis (ICD10-I70.0). Electronically Signed   By: Darrin Nipper M.D.   On: 04/06/2022 11:43   CT Abdomen Pelvis W Contrast  Result Date: 03/21/2022 CLINICAL DATA:  Acute abdominal pain EXAM: CT ABDOMEN AND PELVIS WITH CONTRAST TECHNIQUE: Multidetector CT imaging of the abdomen and pelvis was performed using the standard protocol following bolus administration of intravenous contrast. RADIATION DOSE REDUCTION: This exam was performed according to the departmental dose-optimization program which includes automated exposure control, adjustment of the mA and/or kV according to patient size and/or use of iterative reconstruction technique. CONTRAST:  138m OMNIPAQUE IOHEXOL 300 MG/ML  SOLN COMPARISON:  CT scan of the chest 09/27/2021; CT scan of the abdomen and pelvis 09/22/2021 FINDINGS: Lower chest: Partially imaged ill-defined masslike density versus consolidation in the anterior inferior right middle lobe measuring at least 5.6 x 3.1 cm. Moderate layering right pleural effusion. Trace left pleural effusion. Dependent atelectasis. Cardiomegaly.  No pericardial effusion. Small hiatal hernia with esophageal wall thickening. Hepatobiliary: Normal hepatic contour and morphology. No discrete hepatic lesion. The gallbladder is surgically absent. Similar degree of mild intra and extra hepatic biliary ductal dilatation. The main bile duct measures 8 mm at the pancreatic head. Pancreas: Unremarkable. No pancreatic ductal dilatation or surrounding inflammatory changes. Spleen: Normal in size without focal abnormality. Adrenals/Urinary Tract: Normal adrenal glands. No hydronephrosis, nephrolithiasis  or enhancing renal mass. Simple renal cysts are noted. No imaging follow-up recommended. 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 No evidence of obstruction or focal bowel wall thickening. Normal appendix in the right lower quadrant. The terminal ileum is unremarkable. Vascular/Lymphatic: Atherosclerotic calcifications throughout the abdominal aorta. No evidence of aneurysm or dissection. No evidence of significant arterial stenosis or occlusion. No focal venous abnormality. No suspicious lymphadenopathy. Reproductive: Uterus and bilateral adnexa are unremarkable. Other: No abdominal wall hernia or abnormality. No abdominopelvic ascites. Musculoskeletal: No evidence of acute fracture or malalignment. Multiple thoracic and lumbar compression fractures are again noted without interval change. Evidence of prior cement augmentation at T12. IMPRESSION: 1. Partially imaged ill-defined masslike density versus consolidation in the anterior inferior right middle lobe measuring 5.6 x 3.1 cm. Differential considerations include necrotic pneumonia versus primary bronchogenic carcinoma. Recommend further evaluation with CT scan of the chest. 2. Moderate layering right pleural effusion and trace left pleural effusion. 3. Cardiomegaly. 4. Small hiatal hernia with circumferential esophageal wall thickening. This may reflect GERD, or less likely esophageal neoplasm. 5. Colonic diverticular disease without CT evidence of active inflammation. 6. Scattered atherosclerotic vascular calcifications without aneurysm, dissection or significant vascular occlusion. 7. Stable appearance of multiple thoracic and lumbar compression fractures including prior cement augmentation at T12. Aortic Atherosclerosis (ICD10-I70.0). Electronically Signed   By: Jacqulynn Cadet M.D.   On: 03/18/2022 11:06    Microbiology: No results found for this or any previous visit (from the past 240  hour(s)).  Signed: Raiford Noble, DO [facility]  04-28-2022

## 2022-04-08 NOTE — Progress Notes (Signed)
PROGRESS NOTE    Courtney Arnold  TZG:[id number] DOB: 04/14/1940 DOA: 03/10/2022 PCP: Derek Jack, MD   Brief Narrative:  The patient is an 82 year old female with multiple myeloma, paroxysmal atrial fibrillation, COPD, pancytopenia required packed red blood cell transfusion as well as platelet transfusion, hypothyroidism as well as other comorbidities who presented to [facility] on 03/24/2022, vomiting and poor oral intake for last 3 weeks.  She underwent further imaging and CT chest abdomen pelvis revealed multifocal pneumonia and possible abscess in the right middle lobe with moderate right and small left pleural effusions.  She was transferred to [facility] on 03/23/2022 at the recommendation of pulmonary to obtain a pulmonary consult and she developed A-fib with RVR and started on amiodarone infusion.   On 03/24/2022 pulmonary recommended holding off bronchial containing antibiotics and switch IV amiodarone to oral   04/03/2022 she underwent a thoracentesis of the right pleural space with 200 cc of cloudy fluid obtained and she had a small iatrogenic pneumothorax.  ID was consulted she started on levofloxacin and there was a discussion with her oncologist as below   On 03/26/2022 she started having frequent small stools which continued and on 03/28/2022 heparin was resumed and Imodium frequency was increased.  On 03/16/2022 she had an episode of hematemesis and protamine given and anticoagulation was stopped.   On 03/28/2022 she is felt not to have capacity make her own medical decisions and goals of care discussion was held with her daughters and the attending physician at that time.  Attending physician Dr. Wynelle Cleveland felt that she had a very poor prognosis and was still not able to eat and having multiple loose stools along with her multiple myeloma, pancytopenia as well as new onset A-fib with RVR as well as pneumonia and lung abscess and transudative pleural effusion and slight  pneumothorax that she would have a poor outcome.  Family did not want to transition her to comfort care and wanted to treat the treatable.  Family felt that she could not go home.   After her episode of hematemesis she did have a nosebleed and GI scoped the patient and felt that the bleeding was from her nose given her history of epistaxis.  GI has since signed off the case but did find esophageal plaques suspicious for candidiasis and so she was started on fluconazole and the ID team increase this to 200 mg daily.     04/01/2022 she appears little volume overloaded so we will give her dose of IV Lasix just 20 mg and given a dose of IV albumin 25 g as well.  Diet will be advanced and she has no further epistaxis so we will consider changing her from IV heparin to an oral anticoagulant but will wait to see if she requires a repeat thoracentesis.  May need to reengage pulmonary given that her right pleural effusion is slightly worsening.  Palliative care continue to have goals of care discussion.   **04/02/22: Further decline and became more hypoxic so oxygen requirements increased.  Repeat chest x-ray showed worsening of her chest x-ray and she was possibly aspirating so Unasyn was started.  Pulmonary was reconsulted and they did a bedside ultrasound which showed a very large pleural effusion.  Palliative care was reengaged and given her clinical worsening and after further family discussion a decision was made to transition the patient to full comfort measures and all medications not in the patient's comfort have been discontinued and she has been started on  acetaminophen of 5 mg 3 times daily, albuterol nebs, bisacodyl suppositories, Ativan IV anxiety IV morphine 1 mg every hour for moderate pain and dyspnea, ondansetron as well as Liquifilm Tears.     Patient has a high risk for further clinical decompensation and worsening in she is not transition to residential hospice anticipating in-hospital death given  her extremely poor prognosis and multiple medical comorbidities. She appeared to be worsening so In-hospital death is expected.   Assessment and Plan:  Aspiration from vomiting at home but cannot rule out other infections as she is immune compromised -CT reveals bilateral multifocal nodules with consolidation in the right middle lobe, right moderate pleural effusion and small left pleural effusion-near complete resolution of previously noted left upper lobe consolidation  -ID has evaluated the patient and recommended oral levaquin and have now placed a stop date for this. Given ongoing loose stools they advise continuing through 1/25 and then trial off it -Continue with albuterol 2.5 mg nebs every 2 as needed for wheezing and shortness of breath, dextromethorphan-guaifenesin 1 tab p.o. twice daily, -Will add Flutter valve and incentive spirometry -Continue monitor respiratory status carefully and repeat chest x-ray in a.m. [name] was following and recommended antibiotics for 14 days as well as aspiration precautions and pulmonary hygiene -Pulmonary was reconsulted for repeat thoracentesis that she has a very large right-sided pleural effusion on ultrasound.  After further goals of care discussion with the patient and the family decision was made to transition the patient to full comfort measures and stopping all medications not in patient's comfort and focus on symptomatic management   Epistaxis causing Hematemesis, improving  -Has hx of nosebleeds and had one yesterday that was minor and resolved spontaneously.  Yesterday morning RN reported vomiting up blood after being given water. Has not had recurrence. Hgb 7.7 today from 8 yesterday. -GI evaluated and was n.p.o. yesterday for EGD and now on a clear liquid diet; EGD was done and there is no obvious source in the upper GI tract for bleeding or hematemesis and likely the source of bleeding from the posterior oropharynx and epistaxis -Now off  lovenox, protamine given -Will try to wean off o2 as likely contributes to nosebleed. If unable to do so will plan on humidified air. Will also continue Oxymetazoline 1 spray each nare BID  -Hgb/Hct Trend: Recent Labs  Lab 03/28/22 1257 03/29/22 1140 03/27/2022 0352 03/10/2022 1411 03/31/22 0726 04/01/22 0135 04/02/22 0234  HGB 8.1* 8.0* 7.7* 7.7* 8.3* 7.6* 7.2*  HCT 25.1* 24.9* 23.9* 25.6* 25.8* 24.3* 22.4*  MCV 100.0 100.4* 103.5*  --  101.2* 103.8* 101.8*  -Was on PPI but now stopped due to [place] -FOBT was Positive  -If she continues to currently have epistaxis we will consult ENT for further evaluation and recommendations however she is being transitioned to full comfort care now   Duodenal Mass -Large polypoid mass with no bleeding was found in the second portion of the duodenum and biopsies were taken with cold forceps for histology and these are currently pending   Iatrogenic right pneumothorax Acute Hypoxic respiratory failure -Post thoracentesis - improving but Re-occurring Pleural Effusion so will discuss with Pulmonary   -SpO2: (!) 86 % O2 Flow Rate (L/min): 4 L/min FiO2 (%): 50 % -Levofloxacin for antibiotic coverage has been discontinued however given that there is concern that she was aspirating SLP was ordered and she was placed back on IV Unasyn. -Continue with albuterol 2.5 mg nebs every 2 as needed for wheezing and  shortness of breath, dextromethorphan-guaifenesin 1 tab p.o. twice daily, -Pulmonary consulted for further evaluation and they did a bedside ultrasound which showed a very large right pleural effusion.  After further goals of care discussion family does not want a repeat thoracentesis and she is being transitioned to comfort care.   Hypotension, blood pressure remains on the lower side -BP was on the softer side -She remains volume overloaded and hesitant to give more fluid.  Dose of IV albumin was ordered however she did not IV access. -Continue to  Monitor and Trend BP's per Protocol -Last BP was 92/41 and on the softer side on last check but no longer checking BP due to being Comfort Care -Will order TED hose and IV Albumin 25 g x1 -Subsequently she is being transitioned to full comfort care measures   Nausea/ vomiting with poor oral intake -She has had vomiting for few weeks and continued to do so in the hospital -Abdomen distended on exam but has been non tender  -CT scan on 1/16 which revealed a surgically absent gallbladder, CBD of 8 mm, small hiatal hernia with circumferential esophageal wall thickening suggestive of GERD or less likely esophageal neoplasm -The patient takes Decadron twice a week as outpt -Per family and RN, she vomits/regurgitates just within a few minutes of eating-  -SLP eval done- no signs of aspiration -No thrush noted on exam but noted to have Candidiasis on EGD so now on Fluconazole  -Will discontinue all medications not in the patient's comfort given that she is being transitioned to comfort care continue symptomatic management   Loose stool -Possibly from antibiotics- abdomen is still non tender so less likely it is infectious. This morning loose stool but not watery -C/w Imodium PRN   Multiple myeloma, relapsed Fatigue related to cancer -Receiving pomalidomide as outpatient with 1/16 to have been the date of her next cycle -Has lesions throughout her skeleton -Receives acyclovir prophylaxis twice a day, Decadron and Ritalin -Her oncologist has recommended to hold Pomalidomide & Decadron and follow-up with him as outpatient to decide on whether or not to resume these -Being transitioned to [place] now and all medications and interventions stopped that are not in line with the Comfort Focus   Paroxysmal atrial fibrillation with RVR -new diagnosis Pleural effusions; Right > Left  Mod pulm HTN- possibility of diastolic dysfunction -[id number] score is 4 -Transthoracic ECHO> EF 55-60%, unable to  evaluate diastolic function, b/l atrial dilation, mild to mod TR, mo elevated pulm pressure, normal IV function with mild RV enlargement -Placed on IV amiodarone and transitioned to oral amiodarone   -Given a dose of IV Lasix 20 mg x 1 -Plan was for 400 mg twice daily x 10 days (stop date 1/25), then 200 mg daily and then outpatient follow-up but will stop given transitioning to [place]  -Anticoagulation resumed but will discontinue that she is [place] CXR done showed "Increased airspace opacity in the right mid lung and right lung apex concerning for superimposed  pneumonia or asymmetric edema. Small bilateral pleural effusions. Increased interstitial accentuation in the left lung, potentially from interstitial edema or atypical pneumonia. Old left rib fractures. Thoracic spondylosis with remote lower thoracic compression fractures and multiple vertebral augmentations."   Acute Urinary Retention -RN says foley was placed for retention and Foley catheter was initially discontinued however she continues to retain so Foley catheter will be reinserted and will be maintained for comfort    Right Lower Extremity Swelling -Per daughter this  is new. U/s 1/22 shows no signs DVT   Left Upper Extremity Swelling -U/S ordered was completed today and showed no evidence of the deep vein thrombosis in the upper extremites but findings are consistent with an acute superficial vein thrombosis involving the left cephalic vein -Continue with elevation -Back on Anticoagulation but now will discontinue that patient is now [place] and Physical Deconditioning  -PT advising SNF, family interested in that. TOC consulted for assistance with placement but now has a high chance for In Hospital death given that she has been transitioned to [place] and may consider [place] as opposed to SNF   Hypothyroidism -TSH was 5.614 -Likely sick euthyroid -Discontinue  Levothyroxine 137 mcg po Daily given that she is now comfort care all   Esophageal Candidiasis -Noted on EGD -GI recommending Fluconazole 100 mg po Daily x3 Weeks but this was increased to 200 mg po Daily; However after being transitioned to [place] her Fluconazole is stopped  -Will not repeat labs in the morning given that she is now fully Comfort Care   Metabolic Acidosis -Patient's CO2 is now 20, AG is 11, and Chloride Level is now 107 on last check  -Will not repeat labs in the morning given that she is now fully Comfort Care   Hypophosphatemia -Phos Level Trend: Recent Labs  Lab 03/23/22 0500 04/01/22 0135 04/02/22 0234  PHOS 2.8 1.5* 2.5  -Replete with po K Phos Neutral 500 mg po BID x 2  -Continue to Monitor and Replete as Necessary -Will not repeat labs in the morning given that she is now fully Comfort Care   Pancytopenia in the Setting of Multiple Myeloma -CBC Trend: Recent Labs  Lab 03/27/22 0117 03/28/22 1257 03/29/22 1140 03/09/2022 0352 03/24/2022 1411 03/31/22 0726 04/01/22 0135 04/02/22 0234  WBC 5.2 4.7 4.4 3.5*  --  3.6* 4.3 2.8*  HGB 7.7* 8.1* 8.0* 7.7*   < > 8.3* 7.6* 7.2*  HCT 23.5* 25.1* 24.9* 23.9*   < > 25.8* 24.3* 22.4*  MCV 98.3 100.0 100.4* 103.5*  --  101.2* 103.8* 101.8*  PLT 100* 102* 92* 82*  --  106* 102* 79*   < > = values in this interval not displayed.  -Continue to Monitor for S/Sx of Bleeding; No overt bleeding noted now -Will not repeat labs in the morning given that she is now fully comfort care   DVT prophylaxis: Has been transitioned to [place] and expect prognosis to be hours to days with anticipated Hospital Death       Code Status: DNR Family Communication: No family present at bedside   Disposition Plan:  Level of care: Med-Surg Status is: Inpatient Remains inpatient appropriate because: Has been transitioned to [place] and expect prognosis to be hours to days with anticipated Hospital Death       Consultants:  Gastroenterology Infectious Diseases Palliative Care Medicine Cardiology  PCCM/Pulmonary   Procedures:  EGD Findings:      Post nasal drainage and blood was seen in the posterior oropharynx.      Patchy, yellow plaques were found in the entire esophagus. Biopsies were       taken with a cold forceps for histology.      The entire examined stomach was normal.      The cardia and gastric fundus were normal on retroflexion.      A large polypoid mass with no bleeding was found in the second portion  of the duodenum. Biopsies were taken with a cold forceps for histology. Impression:               - Post nasal drainage is seen in the oropharynx.                           - Esophageal plaques were found, suspicious for                            candidiasis. Biopsied.                           - Normal stomach.                           - Rule out malignancy, duodenal mass. Biopsied.                           No obviouse source in the upper GI tract for                            bleed/hematemeis, likely source is bleeding from                            posterior oropharynx/epistaxis Recommendation:           - Resume previous diet.                           - Continue present medications.                           - Await pathology results.                           - Diflucan (fluconazole) 100 mg PO daily for 3                            weeks.                           - Please consult ENT for epistaxis                           - Ok to restart heparin from GI standpoint  Antimicrobials:  Anti-infectives (From admission, onward)    Start     Dose/Rate Route Frequency Ordered Stop   04/02/22 1230  Ampicillin-Sulbactam (UNASYN) 3 g in sodium chloride 0.9 % 100 mL IVPB  Status:  Discontinued        3 g 200 mL/hr over 30 Minutes Intravenous Every 8 hours 04/02/22 1130 04/02/22 1312   04/02/22 1000  levofloxacin (LEVAQUIN) tablet 250 mg  Status:  Discontinued        See Hyperspace for full Linked Orders Report.   250 mg Oral Daily 03/26/22 1624 03/31/22 1014   04/01/22 1000  fluconazole (DIFLUCAN) tablet 200 mg  Status:  Discontinued        200 mg Oral Daily 03/31/22 1606 04/02/22 1317   03/31/22 1000  fluconazole (DIFLUCAN) tablet 100  mg  Status:  Discontinued        100 mg Oral Daily 03/31/22 0843 03/31/22 1606   03/27/22 2200  levofloxacin (LEVAQUIN) IVPB 750 mg  Status:  Discontinued        750 mg 100 mL/hr over 90 Minutes Intravenous Every 48 hours 03/26/22 0919 03/26/22 1624   03/27/22 2200  levofloxacin (LEVAQUIN) IVPB 750 mg       See Hyperspace for full Linked Orders Report.   750 mg 100 mL/hr over 90 Minutes Intravenous Every 48 hours 03/26/22 1624 04/01/22 0006   03/26/22 1215  sulfamethoxazole-trimethoprim (BACTRIM DS) 800-160 MG per tablet 1 tablet  Status:  Discontinued        1 tablet Oral Once per day on Mon Wed Fri 03/26/22 1125 03/16/2022 0749   03/12/2022 2200  levofloxacin (LEVAQUIN) tablet 750 mg  Status:  Discontinued        750 mg Oral Every 48 hours 03/16/2022 1312 03/26/22 0919   03/28/2022 1215  doxycycline (VIBRA-TABS) tablet 100 mg  Status:  Discontinued        100 mg Oral Every 12 hours 03/12/2022 1128 03/16/2022 1312   03/23/22 1300  vancomycin (VANCOREADY) IVPB 750 mg/150 mL  Status:  Discontinued        750 mg 150 mL/hr over 60 Minutes Intravenous Every 24 hours 04/01/2022 1212 03/23/22 0902   03/23/22 1300  vancomycin (VANCOREADY) IVPB 750 mg/150 mL        750 mg 150 mL/hr over 60 Minutes Intravenous Every 24 hours 03/23/22 0902 03/23/22 1442   04/03/2022 2200  piperacillin-tazobactam (ZOSYN) IVPB 3.375 g  Status:  Discontinued        3.375 g 12.5 mL/hr over 240 Minutes Intravenous Every 8 hours 04/03/2022 1447 04/02/2022 1652   03/28/2022 2200  ceFEPIme (MAXIPIME) 2 g in sodium chloride 0.9 % 100 mL IVPB  Status:  Discontinued        2 g 200 mL/hr over 30 Minutes Intravenous Every 12 hours 03/26/2022 1652 03/15/2022 1312   03/31/2022 2200   metroNIDAZOLE (FLAGYL) IVPB 500 mg  Status:  Discontinued        500 mg 100 mL/hr over 60 Minutes Intravenous Every 12 hours 03/14/2022 1652 03/15/2022 1128   04/03/2022 2200  acyclovir (ZOVIRAX) tablet 400 mg  Status:  Discontinued        400 mg Oral 2 times daily 03/13/2022 1807 04/02/22 1312   04/01/2022 1430  piperacillin-tazobactam (ZOSYN) IVPB 3.375 g        3.375 g 100 mL/hr over 30 Minutes Intravenous  Once 03/08/2022 1430 03/24/2022 1953   03/27/2022 1145  vancomycin (VANCOREADY) IVPB 1250 mg/250 mL        1,250 mg 166.7 mL/hr over 90 Minutes Intravenous  Once 03/11/2022 1130 03/23/2022 1559   03/19/2022 1130  ceFEPIme (MAXIPIME) 1 g in sodium chloride 0.9 % 100 mL IVPB  Status:  Discontinued        1 g 200 mL/hr over 30 Minutes Intravenous  Once 03/26/2022 1118 04/06/2022 1129   03/10/2022 1130  ceFEPIme (MAXIPIME) 2 g in sodium chloride 0.9 % 100 mL IVPB        2 g 200 mL/hr over 30 Minutes Intravenous  Once 03/23/2022 1129 03/29/2022 1330   03/10/2022 1115  cefTRIAXone (ROCEPHIN) 1 g in sodium chloride 0.9 % 100 mL IVPB  Status:  Discontinued        1 g 200 mL/hr over 30 Minutes Intravenous  Once 04/01/2022 1114 03/08/2022 1117  04/04/2022 1115  azithromycin (ZITHROMAX) 500 mg in sodium chloride 0.9 % 250 mL IVPB  Status:  Discontinued        500 mg 250 mL/hr over 60 Minutes Intravenous  Once 03/18/2022 1114 03/11/2022 1117       Subjective: Examined at bedside and she appeared more fatigued and rhonchorous.  Extremities started to being a little cool and slightly mottled.  Continues to have some weeping from her edema.  No nausea or vomiting.  No lightheadedness or dizziness.  Still is little short of breath and wears oxygen via nasal cannula  Objective: Vitals:   04/02/22 1333 04/02/22 1541 04/02/22 2109 04/03/22 1118  BP:  (!) 95/42 (!) 104/44 (!) 92/41  Pulse:  72 77 (!) 41  Resp: (!) 35 '20 19 18  '$ Temp:  (!) 97.2 F (36.2 C) (!) 96.8 F (36 C) (!) 96.7 F (35.9 C)  TempSrc:  Axillary Axillary Axillary   SpO2:  100% 98% (!) 86%  Weight:      Height:        Intake/Output Summary (Last 24 hours) at Apr 26, 2022 1506 Last data filed at 04/26/22 0600 Gross per 24 hour  Intake 30 ml  Output 400 ml  Net -370 ml   Filed Weights   03/23/22 1225 03/15/2022 1356  Weight: 57.3 kg 57.3 kg   Examination: Physical Exam:  Constitutional: Elderly chronically ill-appearing Caucasian female who appears look uncomfortable and more fatigued Respiratory: Diminished to auscultation bilaterally with coarse breath sounds and rhonchi and some crackles.  There is a slightly increased respiratory effort but no accessory muscles and is wearing supplemental oxygen via nasal cannula.  Cardiovascular: RRR, no murmurs / rubs / gallops. S1 and S2 auscultated.  1+ lower extremity edema Abdomen: Soft, non-tender, non-distended. Bowel sounds positive.  GU: Deferred. Musculoskeletal: No clubbing / cyanosis of digits/nails. No joint deformity upper and lower extremities.  Skin: Extremities are cool and slightly mottled and she had some weeping edema Neurologic: CN 2-12 grossly intact with no focal deficits. Romberg sign and cerebellar reflexes not assessed.  Psychiatric: Very fatigued and drowsy.Marland Kitchen  Answers questions slowly responding.  Data Reviewed: I have personally reviewed following labs and imaging studies  CBC: Recent Labs  Lab 03/20/2022 0352 03/13/2022 1411 03/31/22 0726 04/01/22 0135 04/02/22 0234  WBC 3.5*  --  3.6* 4.3 2.8*  NEUTROABS  --   --   --  3.5 2.3  HGB 7.7* 7.7* 8.3* 7.6* 7.2*  HCT 23.9* 25.6* 25.8* 24.3* 22.4*  MCV 103.5*  --  101.2* 103.8* 101.8*  PLT 82*  --  106* 102* 79*   Basic Metabolic Panel: Recent Labs  Lab 04/01/2022 0426 03/31/22 0726 04/01/22 0135 04/02/22 0234  NA 137 138 138 138  K 4.3 4.3 4.1 4.1  CL 110 109 108 107  CO2 20* 21* 20* 20*  GLUCOSE 97 125* 129* 93  BUN '13 13 14 16  '$ CREATININE 0.92 0.97 0.86 0.92  CALCIUM 6.9* 6.7* 6.5* 6.2*  MG  --   --  1.8 2.3   PHOS  --   --  1.5* 2.5   GFR: Estimated Creatinine Clearance: 38.6 mL/min (by C-G formula based on SCr of 0.92 mg/dL). Liver Function Tests: Recent Labs  Lab 04/01/22 0135 04/02/22 0234  AST 11* 15  ALT 12 11  ALKPHOS 43 40  BILITOT 0.5 1.0  PROT 5.2* 4.9*  ALBUMIN 2.5* 2.5*   No results for input(s): "LIPASE", "AMYLASE" in the last 168 hours. No results  for input(s): "AMMONIA" in the last 168 hours. Coagulation Profile: No results for input(s): "INR", "PROTIME" in the last 168 hours. Cardiac Enzymes: No results for input(s): "CKTOTAL", "CKMB", "CKMBINDEX", "TROPONINI" in the last 168 hours. BNP (last 3 results) No results for input(s): "PROBNP" in the last 8760 hours. HbA1C: No results for input(s): "HGBA1C" in the last 72 hours. CBG: No results for input(s): "GLUCAP" in the last 168 hours. Lipid Profile: No results for input(s): "CHOL", "HDL", "LDLCALC", "TRIG", "CHOLHDL", "LDLDIRECT" in the last 72 hours. Thyroid Function Tests: No results for input(s): "TSH", "T4TOTAL", "FREET4", "T3FREE", "THYROIDAB" in the last 72 hours. Anemia Panel: No results for input(s): "VITAMINB12", "FOLATE", "FERRITIN", "TIBC", "IRON", "RETICCTPCT" in the last 72 hours. Sepsis Labs: No results for input(s): "PROCALCITON", "LATICACIDVEN" in the last 168 hours.  No results found for this or any previous visit (from the past 240 hour(s)).   Radiology Studies: No results found. Scheduled Meds:  Chlorhexidine Gluconate Cloth  6 each Topical Daily   pantoprazole  40 mg Intravenous Q12H   sodium chloride flush  3 mL Intravenous Q12H   sodium chloride flush  3 mL Intravenous Q12H   Continuous Infusions:  sodium chloride 10 mL/hr at 03/31/22 0513    LOS: 14 days   Raiford Noble, DO [facility] Available via Epic secure chat 7am-7pm After these hours, please refer to coverage provider listed on [email or web address] 04-09-2022, 3:06 PM

## 2022-04-08 NOTE — Progress Notes (Signed)
Patient ID: AVAREY YAEGER, female   DOB: October 27, 1940, 82 y.o.   MRN: [id number]    Progress Note from the Palliative Medicine Team at [facility]   Patient Name: Courtney Arnold        Date: 04/12/22 DOB: 1940/12/16  Age: 82 y.o. MRN#: [id number] Attending Physician: Kerney Elbe, DO Primary Care Physician: Derek Jack, MD Admit Date: 03/29/2022   Medical records reviewed, discussed with treatment team   82 y.o. female  with past medical history of multiple myeloma not in remission, active with Dr. Delton Coombes- was on decadron and polymyst which is now on hold due to her infections, significant pancytopenia with anemia and thrombocytopenia, COPD, A-fib postop with spontaneous conversion, HTN, thyroid disease, compression fracture T12 with kyphoplasty 2011, right femur IM nailing 2021, admitted on 04/07/2022 with lung abscess multi focal pneumonia with abscess formation.   Hospitalization  complicated by a-fib, thrush, epistaxis and hematemesis. EGD completed on 1/23 with findings of thrush, and duodenal mass Seen by ID- to complete antibiotic course on 1/25.     Today is day 11 of this hospital stay.  Further complications noted on chest x-ray today-significant for increased airspace opacity on the right mid lung and right lung apex concerning for superimposed pneumonia.  Ongoing small bilateral pleural effusions.  Increased interstitial in the left lung.  Patient and family face treatment option decisions, advanced directive decisions and anticipatory care needs.   This NP assessed patient at the bedside as a follow up for palliative medicine needs and emotional support and at the request of the attending over continued physical and functional decline secondary to new findings on chest x-ray today.   I met with patient and her 2 daughters at bedside.  Education offered on current medical situation and latest significant finding of large right sided pleural  effusion. Discussed possibility of thoracentesis.  Created space and opportunity for patient to explore thoughts and feelings regarding her current medical situation.  She clearly verbalizes she does not want to continue with life prolonging measures specific to thoracentesis, lab draws, IV medications and cardiac monitoring.  Education offered to patient and her daughters the difference between an aggressive medical intervention path versus a palliative comfort path for this patient, at this time in this situation.  Again patient clearly verbalizes her desire for a comfort path allowing for natural death.  Both daughters at bedside verbalize support of the patient's wish, "we want what  mom wants".  Plan of care---focus of care is comfort, quality and dignity allowing for natural death. -DNR/DNI -No further life-prolonging measures; specific to thoracentesis, no escalation of oxygen -No further diagnostics; lab draws, scans  -Minimize medications-no further IV antibiotics or albumin -Patient and family declined spiritual support -Symptom management      -Morphine IV 1 mg every 1 hour as needed for dyspnea/air hunger or pain--consider continuous drip if as needed medications are not efficacious      -Ativan IV 1 mg every 4 hours as needed for anxiety/agitation -Prognosis is likely hours to days-expect hospital death -PMT will continue to support holistically  Education offered on the natural trajectory and expectations at end-of-life.    Questions and concerns addressed.  Discussed with Dr Alfredia Ferguson and RN/Jen  Total time spent on the unit was 75 minutes-greater than 50% of the time was spent counseling and coordination of care   Wadie Lessen NP  Palliative Medicine Team Team Phone # [phone] Pager [phone]

## 2022-04-08 NOTE — Progress Notes (Signed)
   04/24/2022 1500  Spiritual Encounters  Type of Visit Initial  Care provided to: [facility] present during encounter Nurse  Referral source Nurse (RN/NT/LPN);Family  Reason for visit Patient death  OnCall Visit No  Spiritual Framework  Family Stress Factors Loss  Interventions  Spiritual Care Interventions Made Compassionate presence;Reflective listening;Normalization of emotions   Pt has expired. Pt's two daughters were at the bedside. Family said that they are comforted by the fact that they know that pt has died in peace. One of the daughters regrets not able to make it in time to see pt. [place] provided compassionate presence and emotional support. No follow up needed at this time.

## 2022-04-08 DEATH — deceased

## 2022-04-14 ENCOUNTER — Other Ambulatory Visit: Payer: Medicare Other

## 2022-04-21 ENCOUNTER — Other Ambulatory Visit: Payer: Medicare Other

## 2022-04-23 ENCOUNTER — Ambulatory Visit: Payer: Medicare Other | Admitting: Internal Medicine

## 2022-04-27 ENCOUNTER — Ambulatory Visit: Payer: Medicare Other | Admitting: Hematology

## 2022-04-27 ENCOUNTER — Other Ambulatory Visit: Payer: Medicare Other

## 2022-04-27 ENCOUNTER — Ambulatory Visit: Payer: Medicare Other

## 2022-12-01 NOTE — Progress Notes (Signed)
Orders placed for labs

## 2022-12-02 NOTE — Progress Notes (Signed)
Medication refill

## 2023-10-03 NOTE — Progress Notes (Signed)
 Erroneous encounter

## 2023-11-21 ENCOUNTER — Other Ambulatory Visit (HOSPITAL_COMMUNITY): Payer: Self-pay

## 2023-12-24 IMAGING — CT CT BIOPSY AND ASPIRATION BONE MARROW
1 of 2 series · 15 of 26 positions shown, 19 images · non-contrast
Comparison: none

INDICATION: 81-year-old with multiple myeloma.

[Series 2: i-spiral 5.0 br40 · axial · 0.98mm/px · z∈[+1102,+1172]mm · 15 of 23 slices shown, 19 images]
[im 2/23  mediastinal]
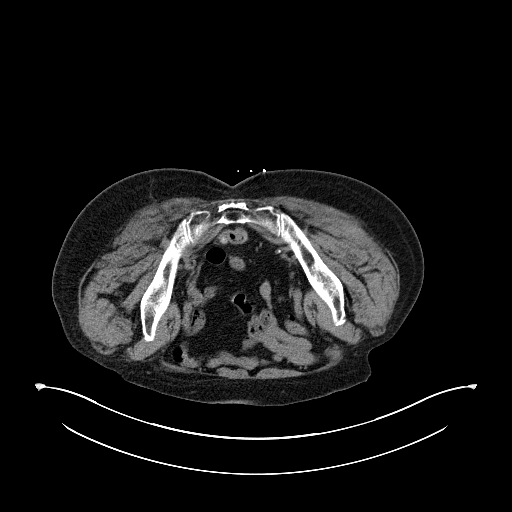
[im 2/23  lung]
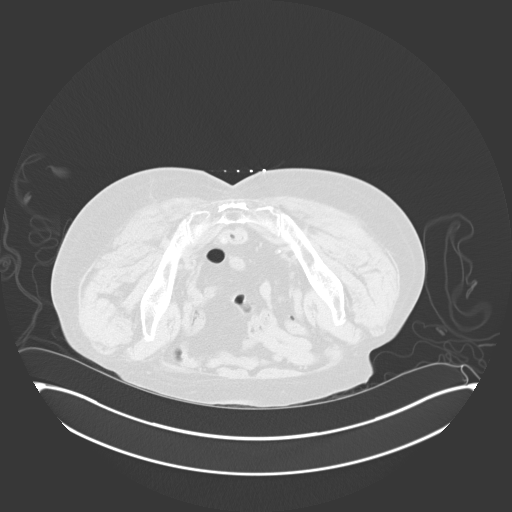
[im 4/23  lung]
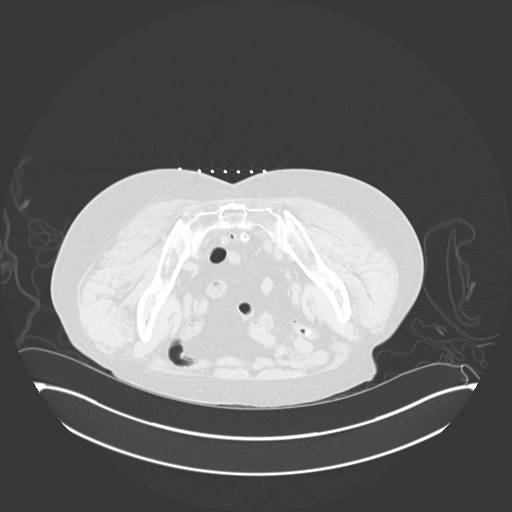
[im 5/23  lung]
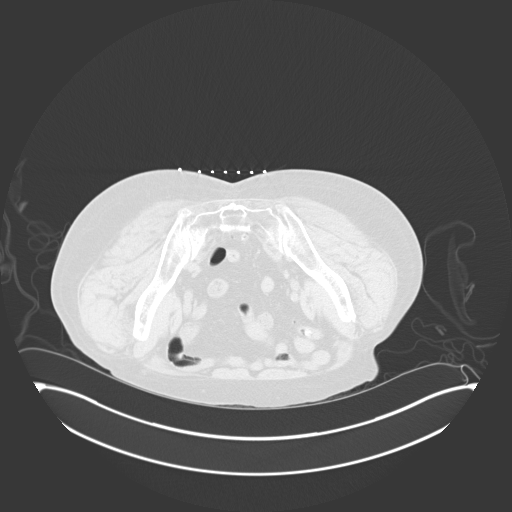
[im 6/23  lung]
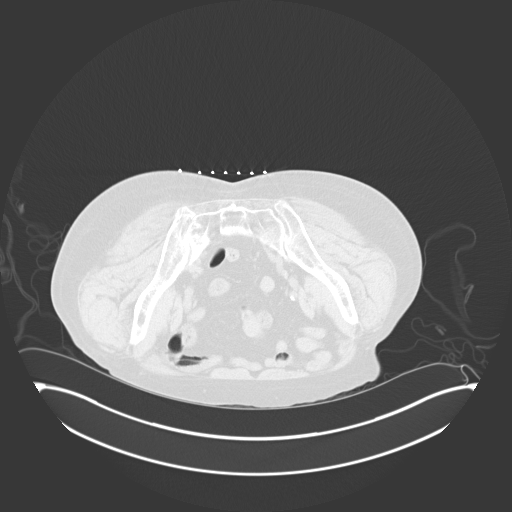
[im 8/23  mediastinal]
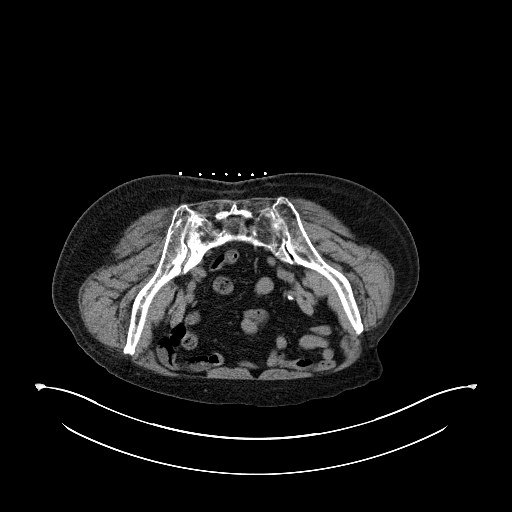
[im 8/23  lung]
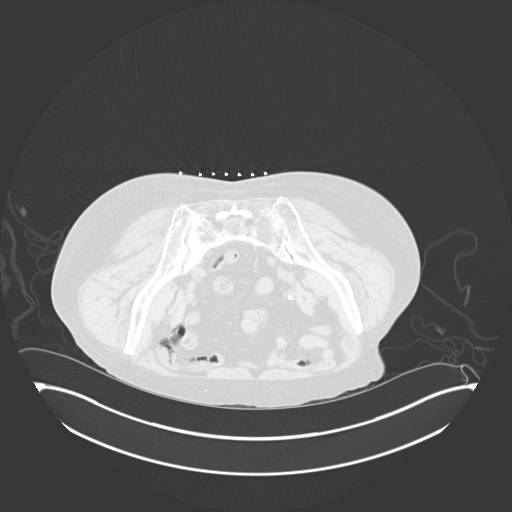
[im 9/23  lung]
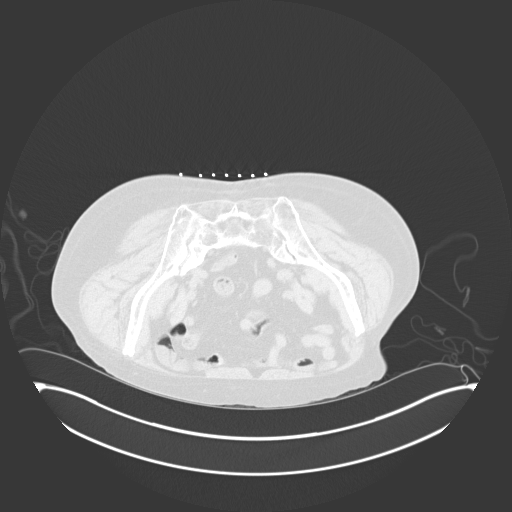
[im 11/23  lung]
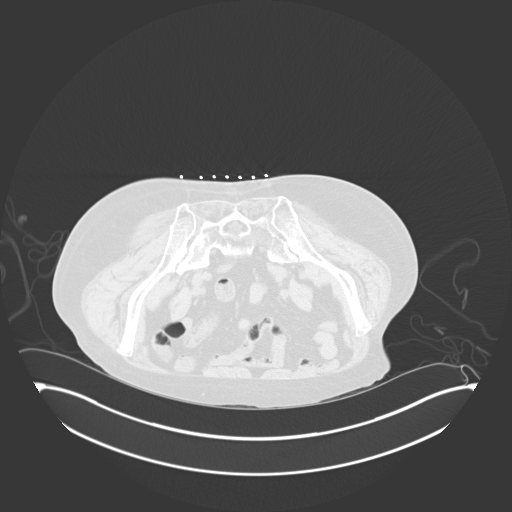
[im 12/23  lung]
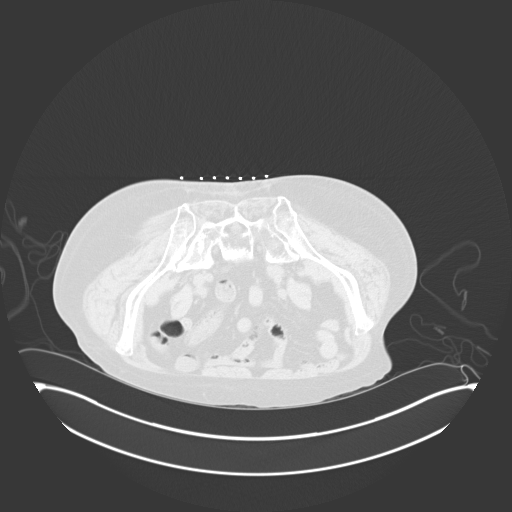
[im 13/23  mediastinal]
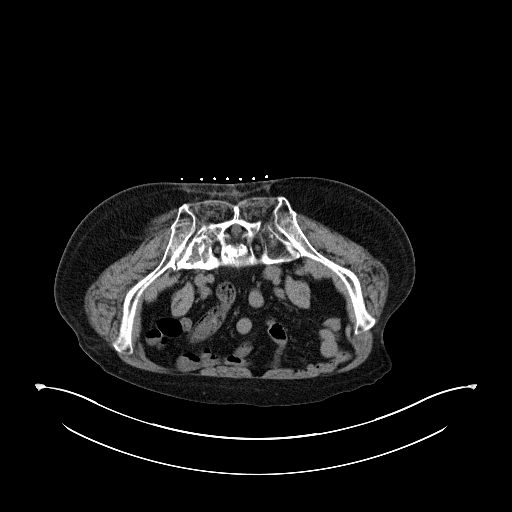
[im 13/23  lung]
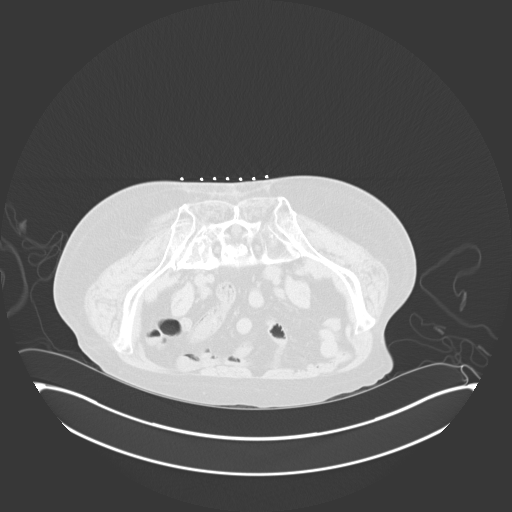
[im 15/23  lung]
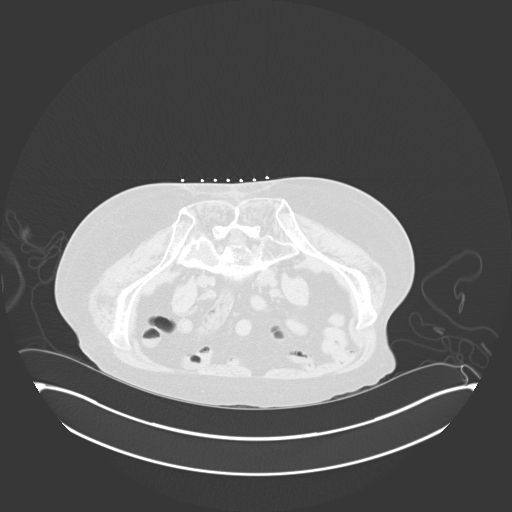
[im 16/23  lung]
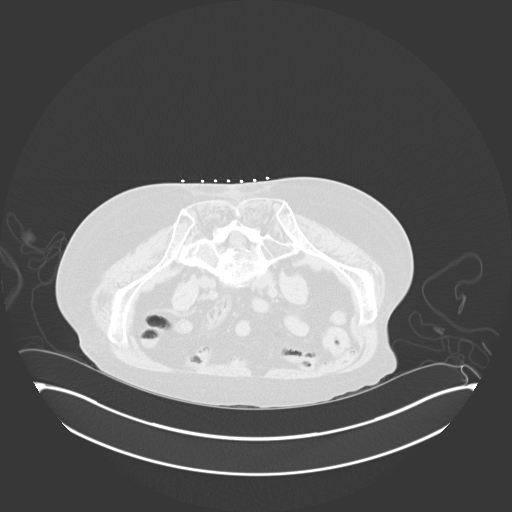
[im 18/23  lung]
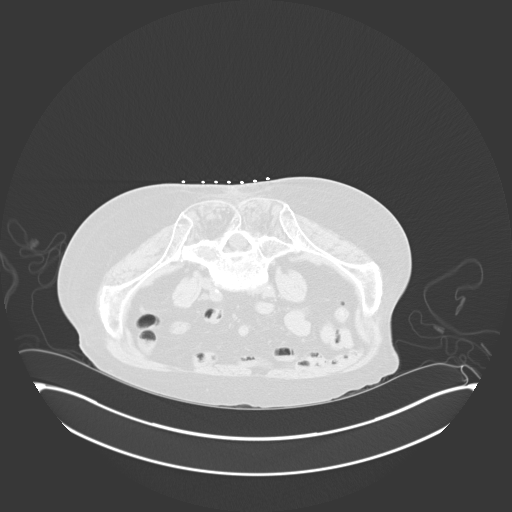
[im 19/23  mediastinal]
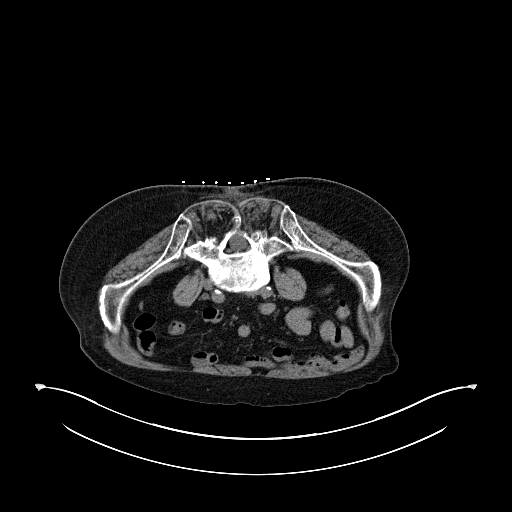
[im 19/23  lung]
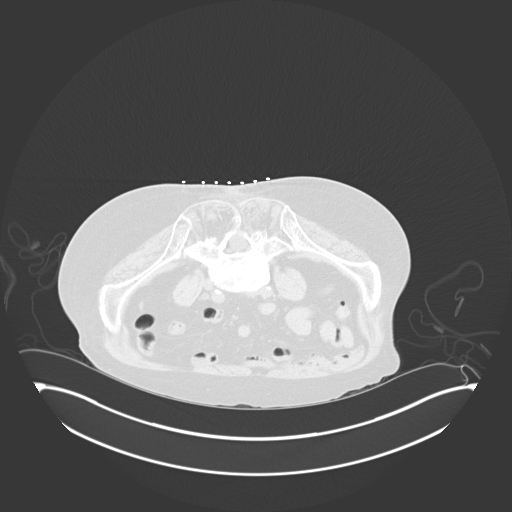
[im 20/23  lung]
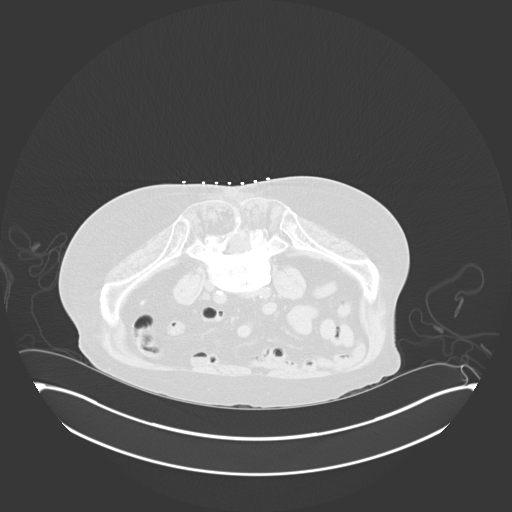
[im 22/23  lung]
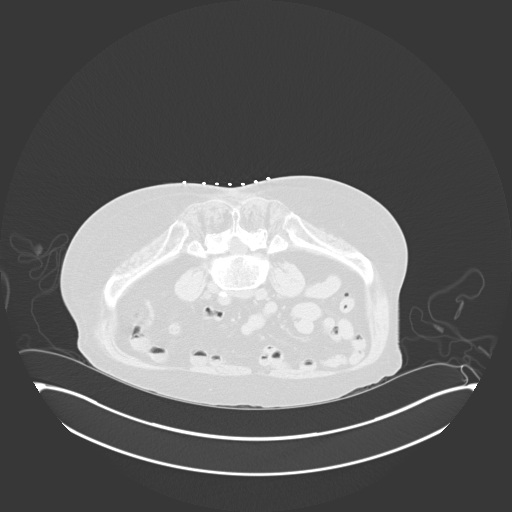

[15 of 26 positions shown; findings below may reference images not displayed]

EXAM:
CT GUIDED BONE MARROW ASPIRATES AND BIOPSY

MEDICATIONS:
Moderate sedation

ANESTHESIA/SEDATION:
Moderate (conscious) sedation was employed during this procedure. A
total of Versed 1.5mg and fentanyl 50 mcg was administered
intravenously at the order of the provider performing the procedure.

Total intra-service moderate sedation time: 10 minutes.

Patient's level of consciousness and vital signs were monitored
continuously by radiology nurse throughout the procedure under the
supervision of the provider performing the procedure.

COMPLICATIONS:
None immediate.

PROCEDURE:
The procedure was explained to the patient. The risks and benefits
of the procedure were discussed and the patient's questions were
addressed. Informed consent was obtained from the patient. The
patient was placed prone on CT table. Images of the pelvis were
obtained. The right side of back was prepped and draped in sterile
fashion. The skin and right posterior ilium were anesthetized with
1% lidocaine. 11 gauge bone needle was directed into the right ilium
with CT guidance. Two aspirates and one core biopsy were obtained.
Bandage placed over the puncture site.

RADIATION DOSE REDUCTION: This exam was performed according to the
departmental dose-optimization program which includes automated
exposure control, adjustment of the mA and/or kV according to
patient size and/or use of iterative reconstruction technique.
FINDINGS: Lucent lesion involving the right sacral ala has decreased in size
since the biopsy on 05/17/2019 and suggestive for post treatment
changes. Biopsy needle directed into the posterior right ilium.
IMPRESSION: CT guided bone marrow aspiration and core biopsy.

## 2023-12-24 IMAGING — CT CT BIOPSY
1 of 2 series · 15 of 26 positions shown, 19 images · non-contrast
Comparison: none

INDICATION: 81-year-old with multiple myeloma.

[Series 2: i-spiral 5.0 br40 · axial · 0.98mm/px · z∈[+1102,+1172]mm · 15 of 23 slices shown, 19 images]
[im 2/23  mediastinal]
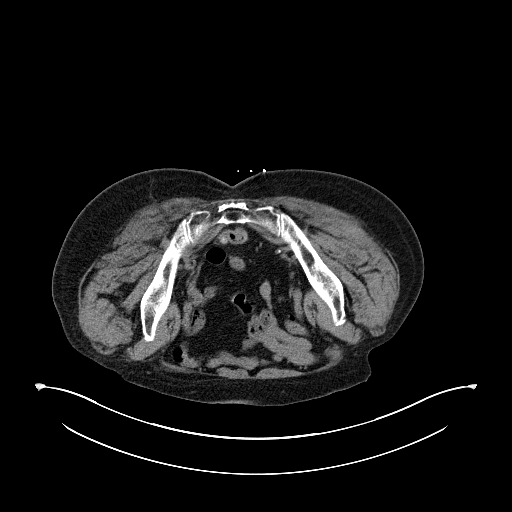
[im 2/23  lung]
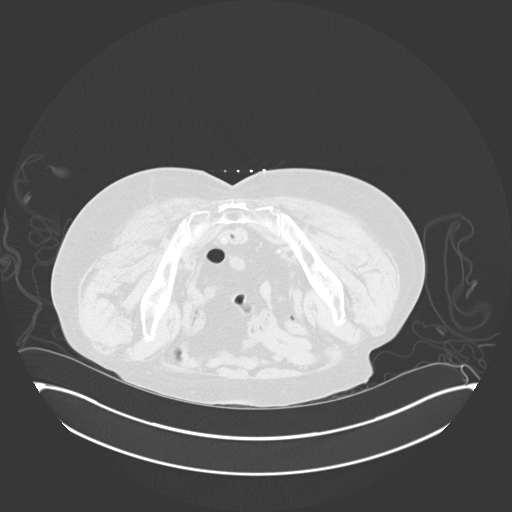
[im 4/23  lung]
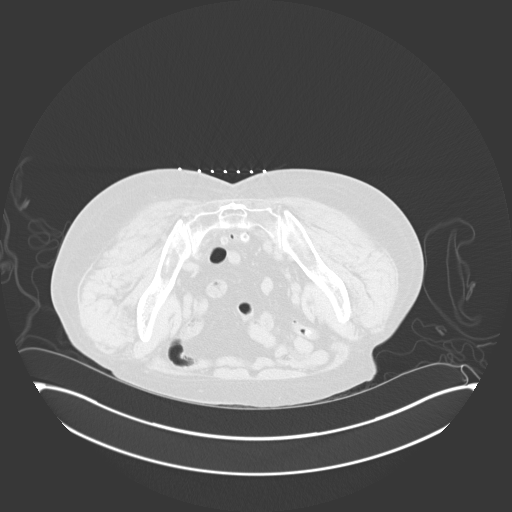
[im 5/23  lung]
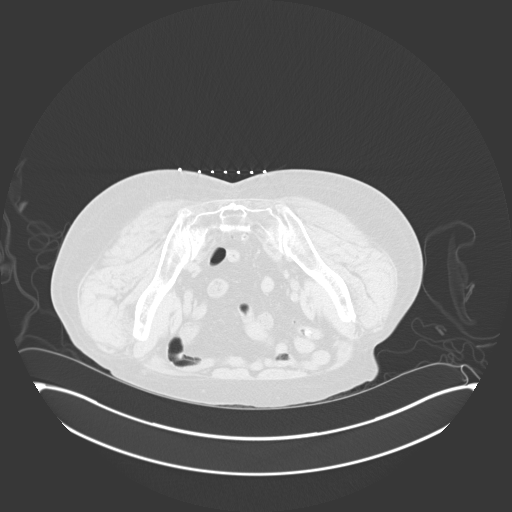
[im 6/23  lung]
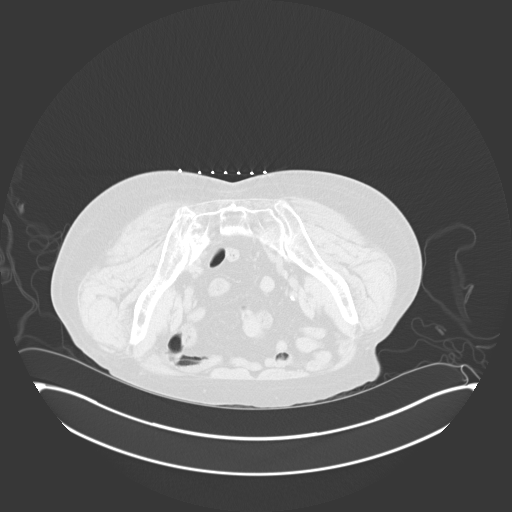
[im 8/23  mediastinal]
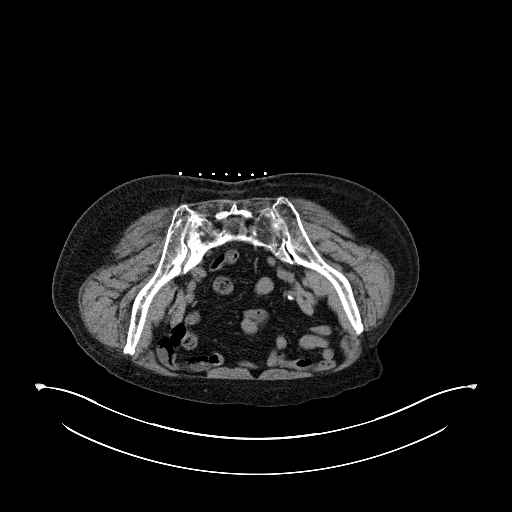
[im 8/23  lung]
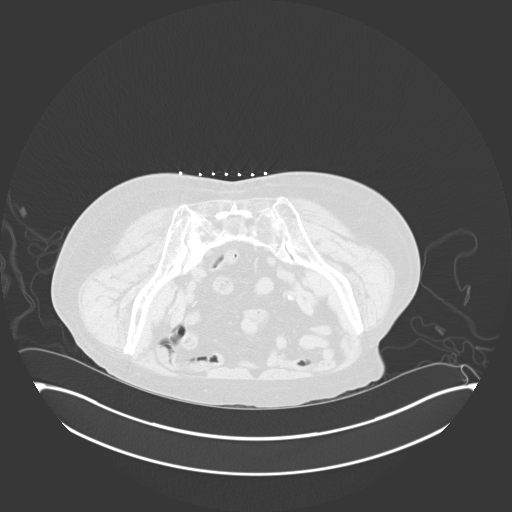
[im 9/23  lung]
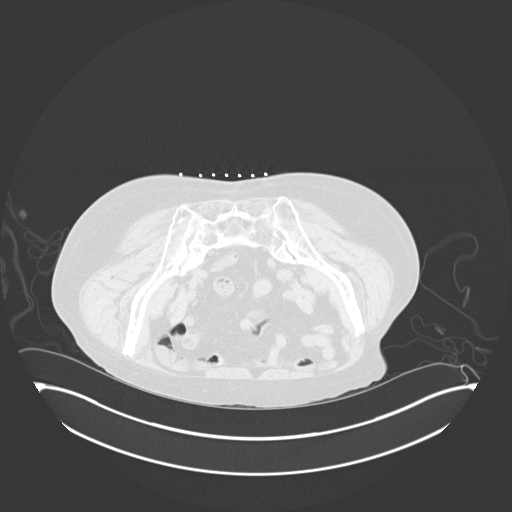
[im 11/23  lung]
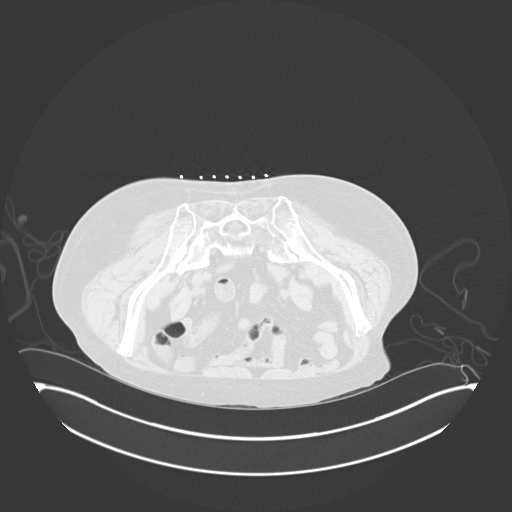
[im 12/23  lung]
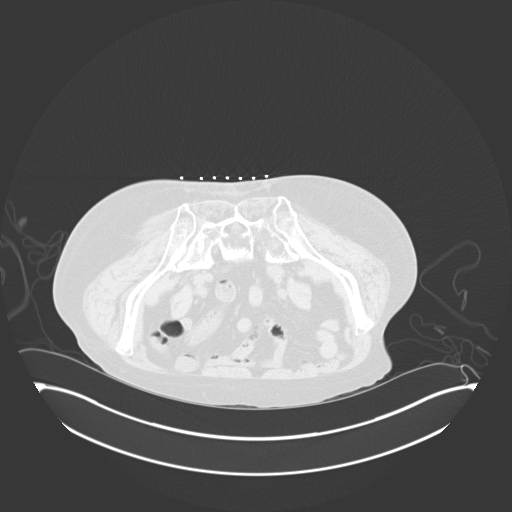
[im 13/23  mediastinal]
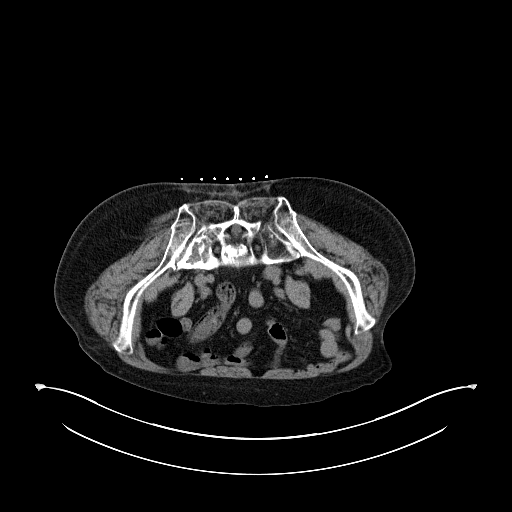
[im 13/23  lung]
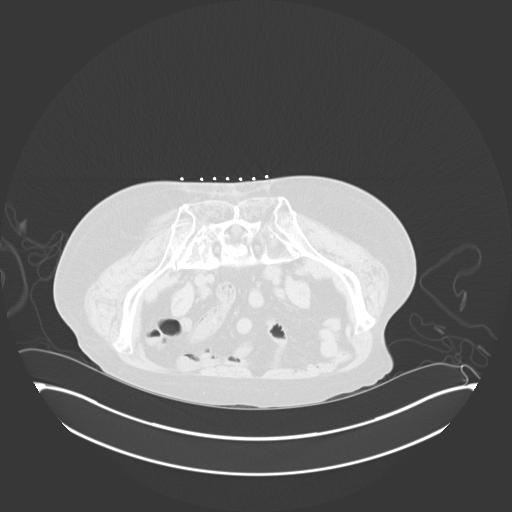
[im 15/23  lung]
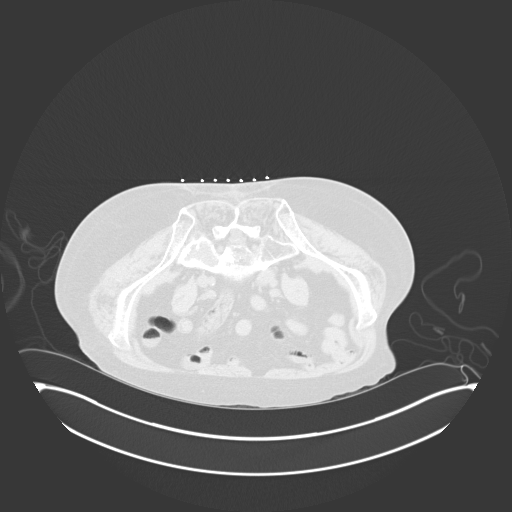
[im 16/23  lung]
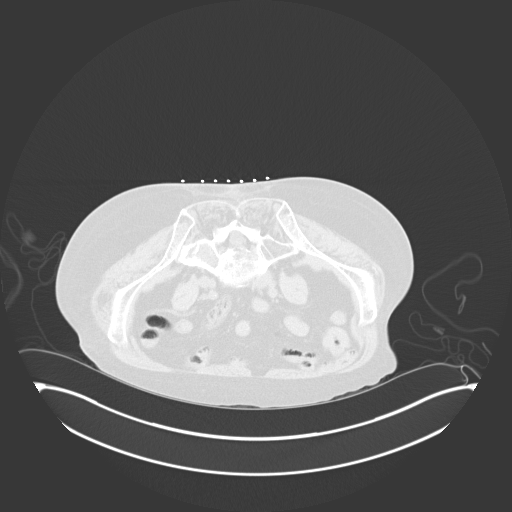
[im 18/23  lung]
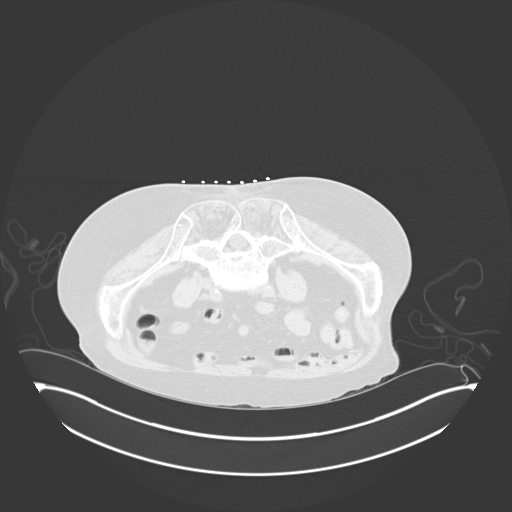
[im 19/23  mediastinal]
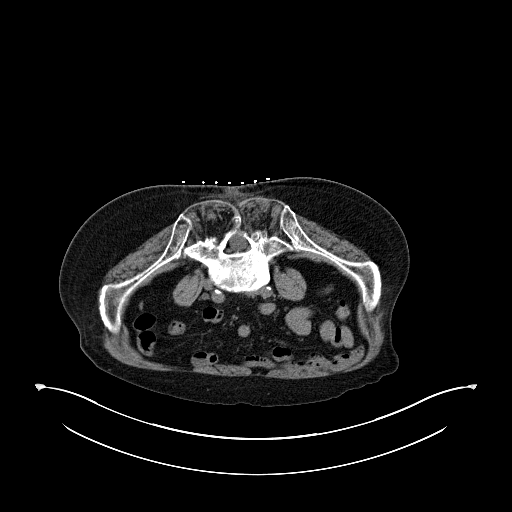
[im 19/23  lung]
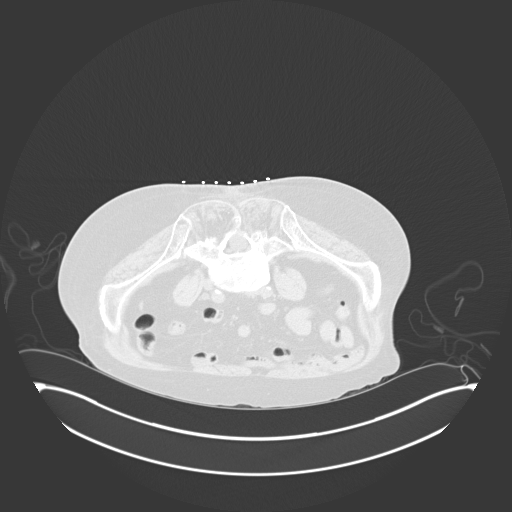
[im 20/23  lung]
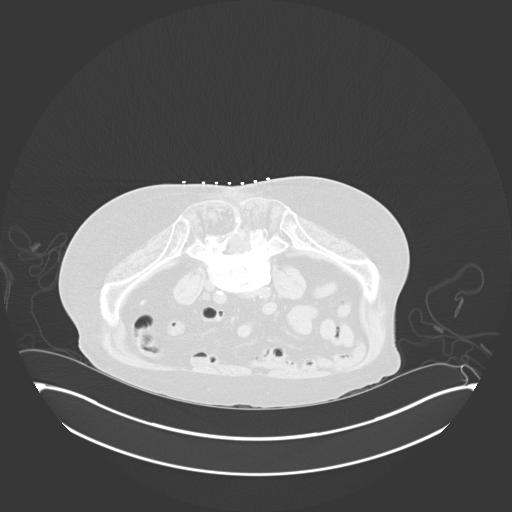
[im 22/23  lung]
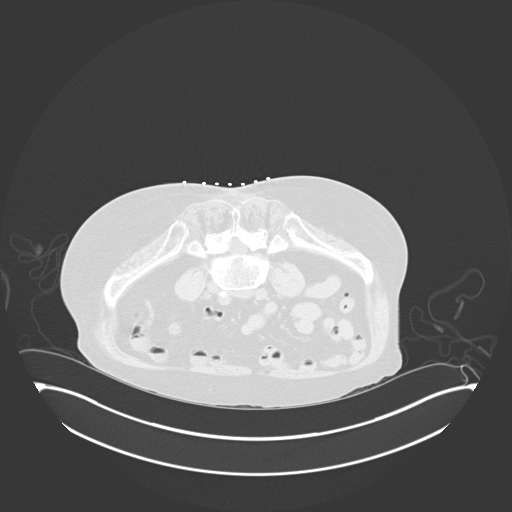

[15 of 26 positions shown; findings below may reference images not displayed]

EXAM:
CT GUIDED BONE MARROW ASPIRATES AND BIOPSY

MEDICATIONS:
Moderate sedation

ANESTHESIA/SEDATION:
Moderate (conscious) sedation was employed during this procedure. A
total of Versed 1.5mg and fentanyl 50 mcg was administered
intravenously at the order of the provider performing the procedure.

Total intra-service moderate sedation time: 10 minutes.

Patient's level of consciousness and vital signs were monitored
continuously by radiology nurse throughout the procedure under the
supervision of the provider performing the procedure.

COMPLICATIONS:
None immediate.

PROCEDURE:
The procedure was explained to the patient. The risks and benefits
of the procedure were discussed and the patient's questions were
addressed. Informed consent was obtained from the patient. The
patient was placed prone on CT table. Images of the pelvis were
obtained. The right side of back was prepped and draped in sterile
fashion. The skin and right posterior ilium were anesthetized with
1% lidocaine. 11 gauge bone needle was directed into the right ilium
with CT guidance. Two aspirates and one core biopsy were obtained.
Bandage placed over the puncture site.

RADIATION DOSE REDUCTION: This exam was performed according to the
departmental dose-optimization program which includes automated
exposure control, adjustment of the mA and/or kV according to
patient size and/or use of iterative reconstruction technique.
FINDINGS: Lucent lesion involving the right sacral ala has decreased in size
since the biopsy on 05/17/2019 and suggestive for post treatment
changes. Biopsy needle directed into the posterior right ilium.
IMPRESSION: CT guided bone marrow aspiration and core biopsy.
